# Patient Record
Sex: Female | Born: 1970 | Race: White | Hispanic: No | Marital: Married | Smoking: Former smoker
Health system: Southern US, Community
[De-identification: ages and names within clinical notes are randomized; demographics above are authoritative.]

## PROBLEM LIST (undated history)

## (undated) DIAGNOSIS — E669 Obesity, unspecified: Secondary | ICD-10-CM

## (undated) DIAGNOSIS — R413 Other amnesia: Secondary | ICD-10-CM

## (undated) DIAGNOSIS — N182 Chronic kidney disease, stage 2 (mild): Secondary | ICD-10-CM

## (undated) DIAGNOSIS — I10 Essential (primary) hypertension: Secondary | ICD-10-CM

## (undated) DIAGNOSIS — F319 Bipolar disorder, unspecified: Secondary | ICD-10-CM

## (undated) DIAGNOSIS — J189 Pneumonia, unspecified organism: Secondary | ICD-10-CM

## (undated) DIAGNOSIS — E785 Hyperlipidemia, unspecified: Secondary | ICD-10-CM

## (undated) DIAGNOSIS — I509 Heart failure, unspecified: Secondary | ICD-10-CM

## (undated) DIAGNOSIS — K589 Irritable bowel syndrome without diarrhea: Secondary | ICD-10-CM

## (undated) DIAGNOSIS — N186 End stage renal disease: Secondary | ICD-10-CM

## (undated) DIAGNOSIS — Z720 Tobacco use: Secondary | ICD-10-CM

## (undated) DIAGNOSIS — Z8659 Personal history of other mental and behavioral disorders: Secondary | ICD-10-CM

## (undated) DIAGNOSIS — R2689 Other abnormalities of gait and mobility: Secondary | ICD-10-CM

## (undated) DIAGNOSIS — J45909 Unspecified asthma, uncomplicated: Secondary | ICD-10-CM

## (undated) DIAGNOSIS — I2510 Atherosclerotic heart disease of native coronary artery without angina pectoris: Secondary | ICD-10-CM

## (undated) DIAGNOSIS — F419 Anxiety disorder, unspecified: Secondary | ICD-10-CM

## (undated) DIAGNOSIS — K219 Gastro-esophageal reflux disease without esophagitis: Secondary | ICD-10-CM

## (undated) DIAGNOSIS — E7800 Pure hypercholesterolemia, unspecified: Secondary | ICD-10-CM

## (undated) DIAGNOSIS — I499 Cardiac arrhythmia, unspecified: Secondary | ICD-10-CM

## (undated) DIAGNOSIS — M797 Fibromyalgia: Secondary | ICD-10-CM

## (undated) DIAGNOSIS — F32A Depression, unspecified: Secondary | ICD-10-CM

## (undated) DIAGNOSIS — D649 Anemia, unspecified: Secondary | ICD-10-CM

## (undated) DIAGNOSIS — R5382 Chronic fatigue, unspecified: Secondary | ICD-10-CM

## (undated) DIAGNOSIS — N189 Chronic kidney disease, unspecified: Secondary | ICD-10-CM

## (undated) DIAGNOSIS — E1010 Type 1 diabetes mellitus with ketoacidosis without coma: Secondary | ICD-10-CM

## (undated) DIAGNOSIS — N393 Stress incontinence (female) (male): Secondary | ICD-10-CM

## (undated) DIAGNOSIS — N390 Urinary tract infection, site not specified: Secondary | ICD-10-CM

## (undated) DIAGNOSIS — E039 Hypothyroidism, unspecified: Secondary | ICD-10-CM

## (undated) DIAGNOSIS — R51 Headache: Secondary | ICD-10-CM

## (undated) DIAGNOSIS — M1990 Unspecified osteoarthritis, unspecified site: Secondary | ICD-10-CM

## (undated) DIAGNOSIS — F329 Major depressive disorder, single episode, unspecified: Secondary | ICD-10-CM

## (undated) DIAGNOSIS — R251 Tremor, unspecified: Secondary | ICD-10-CM

## (undated) DIAGNOSIS — G4730 Sleep apnea, unspecified: Secondary | ICD-10-CM

## (undated) DIAGNOSIS — R519 Headache, unspecified: Secondary | ICD-10-CM

## (undated) DIAGNOSIS — R000 Tachycardia, unspecified: Secondary | ICD-10-CM

## (undated) DIAGNOSIS — M14679 Charcot's joint, unspecified ankle and foot: Secondary | ICD-10-CM

## (undated) HISTORY — PX: OVARY SURGERY: SHX727

## (undated) HISTORY — DX: Chronic fatigue, unspecified: R53.82

## (undated) HISTORY — DX: Hyperlipidemia, unspecified: E78.5

## (undated) HISTORY — DX: Fibromyalgia: M79.7

## (undated) HISTORY — DX: Headache: R51

## (undated) HISTORY — PX: INCONTINENCE SURGERY: SHX676

## (undated) HISTORY — PX: NASAL FRACTURE SURGERY: SHX718

## (undated) HISTORY — DX: Other amnesia: R41.3

## (undated) HISTORY — DX: Other abnormalities of gait and mobility: R26.89

## (undated) HISTORY — DX: Headache, unspecified: R51.9

## (undated) HISTORY — PX: OTHER SURGICAL HISTORY: SHX169

## (undated) HISTORY — DX: Tremor, unspecified: R25.1

## (undated) HISTORY — DX: Personal history of other mental and behavioral disorders: Z86.59

## (undated) HISTORY — DX: Tachycardia, unspecified: R00.0

---

## 1999-06-08 HISTORY — PX: UTERINE FIBROID SURGERY: SHX826

## 1999-07-29 ENCOUNTER — Encounter: Payer: Self-pay | Admitting: Emergency Medicine

## 1999-07-29 ENCOUNTER — Emergency Department (HOSPITAL_COMMUNITY): Admission: EM | Admit: 1999-07-29 | Discharge: 1999-07-29 | Payer: Self-pay | Admitting: Emergency Medicine

## 2000-10-06 ENCOUNTER — Other Ambulatory Visit: Admission: RE | Admit: 2000-10-06 | Discharge: 2000-10-06 | Payer: Self-pay

## 2000-12-28 ENCOUNTER — Emergency Department (HOSPITAL_COMMUNITY): Admission: EM | Admit: 2000-12-28 | Discharge: 2000-12-29 | Payer: Self-pay | Admitting: Emergency Medicine

## 2002-05-14 ENCOUNTER — Emergency Department (HOSPITAL_COMMUNITY): Admission: EM | Admit: 2002-05-14 | Discharge: 2002-05-14 | Payer: Self-pay | Admitting: Emergency Medicine

## 2008-10-16 ENCOUNTER — Emergency Department (HOSPITAL_COMMUNITY): Admission: EM | Admit: 2008-10-16 | Discharge: 2008-10-17 | Payer: Self-pay | Admitting: Emergency Medicine

## 2009-02-12 ENCOUNTER — Emergency Department (HOSPITAL_COMMUNITY): Admission: EM | Admit: 2009-02-12 | Discharge: 2009-02-12 | Payer: Self-pay | Admitting: Emergency Medicine

## 2009-02-13 ENCOUNTER — Ambulatory Visit: Payer: Self-pay | Admitting: *Deleted

## 2009-02-13 ENCOUNTER — Inpatient Hospital Stay (HOSPITAL_COMMUNITY): Admission: RE | Admit: 2009-02-13 | Discharge: 2009-02-15 | Payer: Self-pay | Admitting: *Deleted

## 2009-03-12 ENCOUNTER — Emergency Department (HOSPITAL_COMMUNITY): Admission: EM | Admit: 2009-03-12 | Discharge: 2009-03-13 | Payer: Self-pay | Admitting: Emergency Medicine

## 2009-03-13 ENCOUNTER — Inpatient Hospital Stay (HOSPITAL_COMMUNITY): Admission: AD | Admit: 2009-03-13 | Discharge: 2009-03-14 | Payer: Self-pay | Admitting: Psychiatry

## 2009-03-14 ENCOUNTER — Inpatient Hospital Stay (HOSPITAL_COMMUNITY): Admission: EM | Admit: 2009-03-14 | Discharge: 2009-03-18 | Payer: Self-pay | Admitting: Emergency Medicine

## 2009-03-14 ENCOUNTER — Ambulatory Visit: Payer: Self-pay | Admitting: Psychiatry

## 2010-01-08 ENCOUNTER — Emergency Department (HOSPITAL_COMMUNITY): Admission: EM | Admit: 2010-01-08 | Discharge: 2010-01-08 | Payer: Self-pay | Admitting: Emergency Medicine

## 2010-05-04 ENCOUNTER — Emergency Department (HOSPITAL_COMMUNITY): Admission: EM | Admit: 2010-05-04 | Discharge: 2010-05-05 | Payer: Self-pay | Admitting: Emergency Medicine

## 2010-06-30 ENCOUNTER — Emergency Department (HOSPITAL_COMMUNITY)
Admission: EM | Admit: 2010-06-30 | Discharge: 2010-06-30 | Payer: Self-pay | Source: Home / Self Care | Admitting: Emergency Medicine

## 2010-07-01 LAB — WET PREP, GENITAL
Trich, Wet Prep: NONE SEEN
Yeast Wet Prep HPF POC: NONE SEEN

## 2010-07-01 LAB — RPR: RPR Ser Ql: NONREACTIVE

## 2010-07-01 LAB — URINALYSIS, ROUTINE W REFLEX MICROSCOPIC
Bilirubin Urine: NEGATIVE
Ketones, ur: NEGATIVE mg/dL
Nitrite: NEGATIVE
Protein, ur: >300 mg/dL — AB
Specific Gravity, Urine: 1.018 (ref 1.005–1.030)
Urine Glucose, Fasting: 100 mg/dL — AB
Urobilinogen, UA: 0.2 mg/dL (ref 0.0–1.0)
pH: 6 (ref 5.0–8.0)

## 2010-07-01 LAB — URINE CULTURE: Colony Count: 8000

## 2010-07-01 LAB — URINE MICROSCOPIC-ADD ON

## 2010-07-01 LAB — POCT PREGNANCY, URINE: Preg Test, Ur: NEGATIVE

## 2010-07-02 ENCOUNTER — Emergency Department (HOSPITAL_COMMUNITY)
Admission: EM | Admit: 2010-07-02 | Discharge: 2010-07-02 | Payer: Self-pay | Source: Home / Self Care | Admitting: Emergency Medicine

## 2010-07-02 LAB — URINALYSIS, ROUTINE W REFLEX MICROSCOPIC
Bilirubin Urine: NEGATIVE
Nitrite: NEGATIVE
Protein, ur: >300 mg/dL — AB
Specific Gravity, Urine: 1.025 (ref 1.005–1.030)
Urobilinogen, UA: 0.2 mg/dL (ref 0.0–1.0)

## 2010-07-02 LAB — URINE MICROSCOPIC-ADD ON

## 2010-07-03 LAB — URINE CULTURE
Colony Count: NO GROWTH
Culture: NO GROWTH

## 2010-08-07 DIAGNOSIS — R072 Precordial pain: Secondary | ICD-10-CM

## 2010-08-18 LAB — URINALYSIS, ROUTINE W REFLEX MICROSCOPIC
Bilirubin Urine: NEGATIVE
Ketones, ur: NEGATIVE mg/dL
Leukocytes, UA: NEGATIVE
Nitrite: NEGATIVE
Specific Gravity, Urine: 1.01 (ref 1.005–1.030)
Urobilinogen, UA: 0.2 mg/dL (ref 0.0–1.0)

## 2010-08-18 LAB — BASIC METABOLIC PANEL
BUN: 15 mg/dL (ref 6–23)
CO2: 22 mEq/L (ref 19–32)
Calcium: 9.1 mg/dL (ref 8.4–10.5)
Chloride: 104 mEq/L (ref 96–112)
Creatinine, Ser: 1.81 mg/dL — ABNORMAL HIGH (ref 0.4–1.2)
GFR calc Af Amer: 38 mL/min — ABNORMAL LOW (ref >60)

## 2010-08-18 LAB — CBC
MCH: 30.1 pg (ref 26.0–34.0)
MCHC: 34.6 g/dL (ref 30.0–36.0)
MCV: 87 fL (ref 78.0–100.0)
Platelets: 309 10*3/uL (ref 150–400)
RBC: 4.68 MIL/uL (ref 3.87–5.11)
RDW: 13.1 % (ref 11.5–15.5)

## 2010-08-18 LAB — PREGNANCY, URINE: Preg Test, Ur: NEGATIVE

## 2010-08-18 LAB — DIFFERENTIAL
Basophils Absolute: 0.1 10*3/uL (ref 0.0–0.1)
Basophils Relative: 1 % (ref 0–1)
Eosinophils Absolute: 0.3 10*3/uL (ref 0.0–0.7)
Eosinophils Relative: 3 % (ref 0–5)
Lymphs Abs: 3.3 10*3/uL (ref 0.7–4.0)
Neutrophils Relative %: 57 % (ref 43–77)

## 2010-08-18 LAB — RAPID URINE DRUG SCREEN, HOSP PERFORMED
Barbiturates: NOT DETECTED
Opiates: NOT DETECTED
Tetrahydrocannabinol: NOT DETECTED

## 2010-08-18 LAB — SALICYLATE LEVEL: Salicylate Lvl: <4 mg/dL (ref 2.8–20.0)

## 2010-08-18 LAB — URINE MICROSCOPIC-ADD ON

## 2010-08-21 LAB — BASIC METABOLIC PANEL
Chloride: 101 mEq/L (ref 96–112)
Creatinine, Ser: 2.46 mg/dL — ABNORMAL HIGH (ref 0.4–1.2)
GFR calc Af Amer: 27 mL/min — ABNORMAL LOW (ref >60)

## 2010-08-21 LAB — GLUCOSE, CAPILLARY
Glucose-Capillary: 127 mg/dL — ABNORMAL HIGH (ref 70–99)
Glucose-Capillary: 343 mg/dL — ABNORMAL HIGH (ref 70–99)

## 2010-09-10 LAB — COMPREHENSIVE METABOLIC PANEL WITH GFR
ALT: 18 U/L (ref 0–35)
AST: 18 U/L (ref 0–37)
Albumin: 3 g/dL — ABNORMAL LOW (ref 3.5–5.2)
Alkaline Phosphatase: 115 U/L (ref 39–117)
Chloride: 100 meq/L (ref 96–112)
GFR calc Af Amer: 35 mL/min — ABNORMAL LOW (ref >60)
Potassium: 4.2 meq/L (ref 3.5–5.1)
Sodium: 127 meq/L — ABNORMAL LOW (ref 135–145)
Total Bilirubin: 0.9 mg/dL (ref 0.3–1.2)
Total Protein: 6.3 g/dL (ref 6.0–8.3)

## 2010-09-10 LAB — GLUCOSE, CAPILLARY
Glucose-Capillary: 115 mg/dL — ABNORMAL HIGH (ref 70–99)
Glucose-Capillary: 192 mg/dL — ABNORMAL HIGH (ref 70–99)
Glucose-Capillary: 227 mg/dL — ABNORMAL HIGH (ref 70–99)
Glucose-Capillary: 37 mg/dL — CL (ref 70–99)
Glucose-Capillary: 53 mg/dL — ABNORMAL LOW (ref 70–99)
Glucose-Capillary: 83 mg/dL (ref 70–99)
Glucose-Capillary: 103 mg/dL — ABNORMAL HIGH (ref 70–99)
Glucose-Capillary: 105 mg/dL — ABNORMAL HIGH (ref 70–99)
Glucose-Capillary: 117 mg/dL — ABNORMAL HIGH (ref 70–99)
Glucose-Capillary: 133 mg/dL — ABNORMAL HIGH (ref 70–99)
Glucose-Capillary: 137 mg/dL — ABNORMAL HIGH (ref 70–99)
Glucose-Capillary: 147 mg/dL — ABNORMAL HIGH (ref 70–99)
Glucose-Capillary: 152 mg/dL — ABNORMAL HIGH (ref 70–99)
Glucose-Capillary: 166 mg/dL — ABNORMAL HIGH (ref 70–99)
Glucose-Capillary: 191 mg/dL — ABNORMAL HIGH (ref 70–99)
Glucose-Capillary: 191 mg/dL — ABNORMAL HIGH (ref 70–99)
Glucose-Capillary: 194 mg/dL — ABNORMAL HIGH (ref 70–99)
Glucose-Capillary: 200 mg/dL — ABNORMAL HIGH (ref 70–99)
Glucose-Capillary: 203 mg/dL — ABNORMAL HIGH (ref 70–99)
Glucose-Capillary: 215 mg/dL — ABNORMAL HIGH (ref 70–99)
Glucose-Capillary: 220 mg/dL — ABNORMAL HIGH (ref 70–99)
Glucose-Capillary: 221 mg/dL — ABNORMAL HIGH (ref 70–99)
Glucose-Capillary: 222 mg/dL — ABNORMAL HIGH (ref 70–99)
Glucose-Capillary: 222 mg/dL — ABNORMAL HIGH (ref 70–99)
Glucose-Capillary: 227 mg/dL — ABNORMAL HIGH (ref 70–99)
Glucose-Capillary: 236 mg/dL — ABNORMAL HIGH (ref 70–99)
Glucose-Capillary: 253 mg/dL — ABNORMAL HIGH (ref 70–99)
Glucose-Capillary: 257 mg/dL — ABNORMAL HIGH (ref 70–99)
Glucose-Capillary: 260 mg/dL — ABNORMAL HIGH (ref 70–99)
Glucose-Capillary: 278 mg/dL — ABNORMAL HIGH (ref 70–99)
Glucose-Capillary: 300 mg/dL — ABNORMAL HIGH (ref 70–99)
Glucose-Capillary: 328 mg/dL — ABNORMAL HIGH (ref 70–99)
Glucose-Capillary: 41 mg/dL — ABNORMAL LOW (ref 70–99)
Glucose-Capillary: 42 mg/dL — ABNORMAL LOW (ref 70–99)
Glucose-Capillary: 78 mg/dL (ref 70–99)
Glucose-Capillary: 81 mg/dL (ref 70–99)
Glucose-Capillary: 93 mg/dL (ref 70–99)
Glucose-Capillary: 93 mg/dL (ref 70–99)
Glucose-Capillary: >600 mg/dL (ref 70–99)
Glucose-Capillary: >600 mg/dL (ref 70–99)
Glucose-Capillary: >600 mg/dL (ref 70–99)
Glucose-Capillary: >600 mg/dL (ref 70–99)

## 2010-09-10 LAB — BLOOD GAS, VENOUS
Acid-base deficit: 4.1 mmol/L — ABNORMAL HIGH (ref 0.0–2.0)
Bicarbonate: 18.8 mEq/L — ABNORMAL LOW (ref 20.0–24.0)
Drawn by: 129801
FIO2: 0.21 %
O2 Saturation: 91.9 %
Patient temperature: 98.6
TCO2: 16.7 mmol/L (ref 0–100)
pCO2, Ven: 29.3 mmHg — ABNORMAL LOW (ref 45.0–50.0)
pH, Ven: 7.422 — ABNORMAL HIGH (ref 7.250–7.300)
pO2, Ven: 61.3 mmHg — ABNORMAL HIGH (ref 30.0–45.0)

## 2010-09-10 LAB — BASIC METABOLIC PANEL
BUN: 17 mg/dL (ref 6–23)
BUN: 22 mg/dL (ref 6–23)
BUN: 23 mg/dL (ref 6–23)
BUN: 23 mg/dL (ref 6–23)
BUN: 25 mg/dL — ABNORMAL HIGH (ref 6–23)
BUN: 25 mg/dL — ABNORMAL HIGH (ref 6–23)
BUN: 26 mg/dL — ABNORMAL HIGH (ref 6–23)
BUN: 35 mg/dL — ABNORMAL HIGH (ref 6–23)
Calcium: 7.9 mg/dL — ABNORMAL LOW (ref 8.4–10.5)
Calcium: 8.2 mg/dL — ABNORMAL LOW (ref 8.4–10.5)
Calcium: 8.2 mg/dL — ABNORMAL LOW (ref 8.4–10.5)
Calcium: 8.5 mg/dL (ref 8.4–10.5)
Calcium: 9.1 mg/dL (ref 8.4–10.5)
Chloride: 101 mEq/L (ref 96–112)
Chloride: 106 mEq/L (ref 96–112)
Chloride: 107 mEq/L (ref 96–112)
Creatinine, Ser: 1.46 mg/dL — ABNORMAL HIGH (ref 0.4–1.2)
Creatinine, Ser: 1.49 mg/dL — ABNORMAL HIGH (ref 0.4–1.2)
Creatinine, Ser: 1.53 mg/dL — ABNORMAL HIGH (ref 0.4–1.2)
Creatinine, Ser: 1.53 mg/dL — ABNORMAL HIGH (ref 0.4–1.2)
Creatinine, Ser: 1.75 mg/dL — ABNORMAL HIGH (ref 0.4–1.2)
Creatinine, Ser: 1.78 mg/dL — ABNORMAL HIGH (ref 0.4–1.2)
Creatinine, Ser: 2.04 mg/dL — ABNORMAL HIGH (ref 0.4–1.2)
GFR calc Af Amer: 33 mL/min — ABNORMAL LOW (ref >60)
GFR calc Af Amer: 37 mL/min — ABNORMAL LOW (ref >60)
GFR calc Af Amer: 46 mL/min — ABNORMAL LOW (ref >60)
GFR calc Af Amer: 46 mL/min — ABNORMAL LOW (ref >60)
GFR calc non Af Amer: 27 mL/min — ABNORMAL LOW (ref >60)
GFR calc non Af Amer: 30 mL/min — ABNORMAL LOW (ref >60)
GFR calc non Af Amer: 32 mL/min — ABNORMAL LOW (ref >60)
GFR calc non Af Amer: 33 mL/min — ABNORMAL LOW (ref >60)
GFR calc non Af Amer: 35 mL/min — ABNORMAL LOW (ref >60)
GFR calc non Af Amer: 38 mL/min — ABNORMAL LOW (ref >60)
GFR calc non Af Amer: 40 mL/min — ABNORMAL LOW (ref >60)
Glucose, Bld: 158 mg/dL — ABNORMAL HIGH (ref 70–99)
Glucose, Bld: 164 mg/dL — ABNORMAL HIGH (ref 70–99)
Glucose, Bld: 222 mg/dL — ABNORMAL HIGH (ref 70–99)
Glucose, Bld: 249 mg/dL — ABNORMAL HIGH (ref 70–99)
Glucose, Bld: 291 mg/dL — ABNORMAL HIGH (ref 70–99)
Potassium: 3.6 mEq/L (ref 3.5–5.1)
Potassium: 3.7 mEq/L (ref 3.5–5.1)
Potassium: 3.7 mEq/L (ref 3.5–5.1)
Potassium: 4.2 mEq/L (ref 3.5–5.1)
Sodium: 133 mEq/L — ABNORMAL LOW (ref 135–145)

## 2010-09-10 LAB — BASIC METABOLIC PANEL WITH GFR
CO2: 18 meq/L — ABNORMAL LOW (ref 19–32)
Calcium: 8.4 mg/dL (ref 8.4–10.5)
Chloride: 96 meq/L (ref 96–112)
Glucose, Bld: 919 mg/dL (ref 70–99)
Potassium: 7.7 meq/L (ref 3.5–5.1)
Sodium: 125 meq/L — ABNORMAL LOW (ref 135–145)

## 2010-09-10 LAB — URINE CULTURE
Colony Count: NO GROWTH
Culture: NO GROWTH

## 2010-09-10 LAB — CARDIAC PANEL(CRET KIN+CKTOT+MB+TROPI)
CK, MB: 1.7 ng/mL (ref 0.3–4.0)
Relative Index: INVALID (ref 0.0–2.5)
Troponin I: 0.01 ng/mL (ref 0.00–0.06)
Troponin I: 0.01 ng/mL (ref 0.00–0.06)

## 2010-09-10 LAB — CBC
HCT: 38.8 % (ref 36.0–46.0)
Hemoglobin: 13.4 g/dL (ref 12.0–15.0)
MCHC: 34.6 g/dL (ref 30.0–36.0)
MCV: 91.9 fL (ref 78.0–100.0)
Platelets: 258 10*3/uL (ref 150–400)
Platelets: 307 K/uL (ref 150–400)
RBC: 4.22 MIL/uL (ref 3.87–5.11)
RDW: 12 % (ref 11.5–15.5)
RDW: 12.4 % (ref 11.5–15.5)
WBC: 13.4 10*3/uL — ABNORMAL HIGH (ref 4.0–10.5)
WBC: 9.9 10*3/uL (ref 4.0–10.5)

## 2010-09-10 LAB — MAGNESIUM
Magnesium: 1.6 mg/dL (ref 1.5–2.5)
Magnesium: 1.6 mg/dL (ref 1.5–2.5)
Magnesium: 1.6 mg/dL (ref 1.5–2.5)
Magnesium: 1.7 mg/dL (ref 1.5–2.5)
Magnesium: 1.7 mg/dL (ref 1.5–2.5)
Magnesium: 1.8 mg/dL (ref 1.5–2.5)
Magnesium: 2 mg/dL (ref 1.5–2.5)

## 2010-09-10 LAB — COMPREHENSIVE METABOLIC PANEL
BUN: 34 mg/dL — ABNORMAL HIGH (ref 6–23)
CO2: 19 mEq/L (ref 19–32)
Calcium: 9.5 mg/dL (ref 8.4–10.5)
Creatinine, Ser: 1.95 mg/dL — ABNORMAL HIGH (ref 0.4–1.2)
GFR calc non Af Amer: 29 mL/min — ABNORMAL LOW (ref >60)
Glucose, Bld: 519 mg/dL (ref 70–99)

## 2010-09-10 LAB — URINALYSIS, ROUTINE W REFLEX MICROSCOPIC
Bilirubin Urine: NEGATIVE
Glucose, UA: >1000 mg/dL — AB
Ketones, ur: 15 mg/dL — AB
Leukocytes, UA: NEGATIVE
Nitrite: NEGATIVE
Protein, ur: 100 mg/dL — AB
Specific Gravity, Urine: 1.017 (ref 1.005–1.030)
Urobilinogen, UA: 0.2 mg/dL (ref 0.0–1.0)
pH: 7 (ref 5.0–8.0)

## 2010-09-10 LAB — TROPONIN I: Troponin I: 0.01 ng/mL (ref 0.00–0.06)

## 2010-09-10 LAB — DIFFERENTIAL
Basophils Absolute: 0.4 K/uL — ABNORMAL HIGH (ref 0.0–0.1)
Basophils Relative: 3 % — ABNORMAL HIGH (ref 0–1)
Eosinophils Absolute: 0.2 K/uL (ref 0.0–0.7)
Eosinophils Relative: 2 % (ref 0–5)
Lymphocytes Relative: 10 % — ABNORMAL LOW (ref 12–46)
Lymphs Abs: 1.4 10*3/uL (ref 0.7–4.0)
Monocytes Absolute: 0.8 10*3/uL (ref 0.1–1.0)
Monocytes Relative: 6 % (ref 3–12)
Neutro Abs: 10.6 10*3/uL — ABNORMAL HIGH (ref 1.7–7.7)
Neutrophils Relative %: 79 % — ABNORMAL HIGH (ref 43–77)

## 2010-09-10 LAB — URINE MICROSCOPIC-ADD ON

## 2010-09-10 LAB — KETONES, QUALITATIVE

## 2010-09-10 LAB — HEMOGLOBIN A1C
Hgb A1c MFr Bld: 9.5 % — ABNORMAL HIGH (ref 4.6–6.1)
Mean Plasma Glucose: 226 mg/dL

## 2010-09-10 LAB — CK: Total CK: 46 U/L (ref 7–177)

## 2010-09-10 LAB — PHOSPHORUS: Phosphorus: 1.1 mg/dL — ABNORMAL LOW (ref 2.3–4.6)

## 2010-09-11 LAB — URINALYSIS, ROUTINE W REFLEX MICROSCOPIC
Ketones, ur: NEGATIVE mg/dL
Leukocytes, UA: NEGATIVE
Nitrite: NEGATIVE
Specific Gravity, Urine: 1.011 (ref 1.005–1.030)
Urobilinogen, UA: 0.2 mg/dL (ref 0.0–1.0)
pH: 6.5 (ref 5.0–8.0)

## 2010-09-11 LAB — GLUCOSE, CAPILLARY
Glucose-Capillary: 146 mg/dL — ABNORMAL HIGH (ref 70–99)
Glucose-Capillary: 199 mg/dL — ABNORMAL HIGH (ref 70–99)
Glucose-Capillary: 251 mg/dL — ABNORMAL HIGH (ref 70–99)
Glucose-Capillary: 284 mg/dL — ABNORMAL HIGH (ref 70–99)
Glucose-Capillary: 57 mg/dL — ABNORMAL LOW (ref 70–99)

## 2010-09-11 LAB — CBC
HCT: 37.5 % (ref 36.0–46.0)
Hemoglobin: 12.8 g/dL (ref 12.0–15.0)
MCHC: 34.1 g/dL (ref 30.0–36.0)
MCV: 91.5 fL (ref 78.0–100.0)
Platelets: 291 10*3/uL (ref 150–400)
Platelets: 342 10*3/uL (ref 150–400)
RDW: 12.5 % (ref 11.5–15.5)
RDW: 12.7 % (ref 11.5–15.5)

## 2010-09-11 LAB — DIFFERENTIAL
Basophils Absolute: 0 10*3/uL (ref 0.0–0.1)
Basophils Absolute: 0.1 10*3/uL (ref 0.0–0.1)
Basophils Relative: 0 % (ref 0–1)
Basophils Relative: 1 % (ref 0–1)
Eosinophils Relative: 6 % — ABNORMAL HIGH (ref 0–5)
Lymphocytes Relative: 22 % (ref 12–46)
Lymphocytes Relative: 25 % (ref 12–46)
Monocytes Absolute: 0.8 10*3/uL (ref 0.1–1.0)
Monocytes Relative: 6 % (ref 3–12)
Neutro Abs: 6.9 10*3/uL (ref 1.7–7.7)
Neutrophils Relative %: 59 % (ref 43–77)

## 2010-09-11 LAB — RAPID URINE DRUG SCREEN, HOSP PERFORMED
Amphetamines: NOT DETECTED
Barbiturates: NOT DETECTED
Barbiturates: NOT DETECTED
Benzodiazepines: POSITIVE — AB
Cocaine: NOT DETECTED
Cocaine: NOT DETECTED
Opiates: NOT DETECTED
Opiates: NOT DETECTED
Tetrahydrocannabinol: NOT DETECTED
Tetrahydrocannabinol: NOT DETECTED

## 2010-09-11 LAB — BASIC METABOLIC PANEL
BUN: 21 mg/dL (ref 6–23)
BUN: 22 mg/dL (ref 6–23)
CO2: 25 mEq/L (ref 19–32)
Calcium: 9.3 mg/dL (ref 8.4–10.5)
Chloride: 100 mEq/L (ref 96–112)
Creatinine, Ser: 1.93 mg/dL — ABNORMAL HIGH (ref 0.4–1.2)
GFR calc non Af Amer: 29 mL/min — ABNORMAL LOW (ref >60)
Glucose, Bld: 214 mg/dL — ABNORMAL HIGH (ref 70–99)
Glucose, Bld: 263 mg/dL — ABNORMAL HIGH (ref 70–99)
Potassium: 4.3 mEq/L (ref 3.5–5.1)
Sodium: 133 mEq/L — ABNORMAL LOW (ref 135–145)
Sodium: 134 mEq/L — ABNORMAL LOW (ref 135–145)

## 2010-09-11 LAB — POCT PREGNANCY, URINE: Preg Test, Ur: NEGATIVE

## 2010-09-11 LAB — URINE MICROSCOPIC-ADD ON

## 2010-09-11 LAB — TRICYCLICS SCREEN, URINE: TCA Scrn: POSITIVE — AB

## 2010-09-15 LAB — CBC
HCT: 36.2 % (ref 36.0–46.0)
Hemoglobin: 12.6 g/dL (ref 12.0–15.0)
MCHC: 34.7 g/dL (ref 30.0–36.0)
MCV: 90.4 fL (ref 78.0–100.0)
RBC: 4 MIL/uL (ref 3.87–5.11)
RDW: 12.9 % (ref 11.5–15.5)

## 2010-09-15 LAB — DIFFERENTIAL
Basophils Absolute: 0.1 10*3/uL (ref 0.0–0.1)
Basophils Relative: 1 % (ref 0–1)
Eosinophils Absolute: 0.7 10*3/uL (ref 0.0–0.7)
Eosinophils Relative: 7 % — ABNORMAL HIGH (ref 0–5)
Monocytes Absolute: 0.8 10*3/uL (ref 0.1–1.0)
Monocytes Relative: 7 % (ref 3–12)
Neutro Abs: 6.5 10*3/uL (ref 1.7–7.7)

## 2010-09-15 LAB — GC/CHLAMYDIA PROBE AMP, GENITAL: Chlamydia, DNA Probe: NEGATIVE

## 2010-09-15 LAB — URINALYSIS, ROUTINE W REFLEX MICROSCOPIC
Bilirubin Urine: NEGATIVE
Glucose, UA: NEGATIVE mg/dL
Hgb urine dipstick: NEGATIVE
Specific Gravity, Urine: 1.015 (ref 1.005–1.030)
Urobilinogen, UA: 0.2 mg/dL (ref 0.0–1.0)

## 2010-09-15 LAB — BASIC METABOLIC PANEL
CO2: 28 mEq/L (ref 19–32)
Calcium: 9.4 mg/dL (ref 8.4–10.5)
Chloride: 94 mEq/L — ABNORMAL LOW (ref 96–112)
GFR calc Af Amer: 32 mL/min — ABNORMAL LOW (ref >60)
Glucose, Bld: 169 mg/dL — ABNORMAL HIGH (ref 70–99)
Sodium: 131 mEq/L — ABNORMAL LOW (ref 135–145)

## 2010-09-15 LAB — WET PREP, GENITAL

## 2010-12-26 ENCOUNTER — Emergency Department (HOSPITAL_COMMUNITY)
Admission: EM | Admit: 2010-12-26 | Discharge: 2010-12-26 | Disposition: A | Discharge status: Home Health | Payer: Medicaid Other | Attending: Emergency Medicine | Admitting: Emergency Medicine

## 2010-12-26 ENCOUNTER — Encounter: Payer: Self-pay | Admitting: *Deleted

## 2010-12-26 DIAGNOSIS — R112 Nausea with vomiting, unspecified: Secondary | ICD-10-CM | POA: Insufficient documentation

## 2010-12-26 DIAGNOSIS — I10 Essential (primary) hypertension: Secondary | ICD-10-CM | POA: Insufficient documentation

## 2010-12-26 DIAGNOSIS — E0789 Other specified disorders of thyroid: Secondary | ICD-10-CM | POA: Insufficient documentation

## 2010-12-26 DIAGNOSIS — R197 Diarrhea, unspecified: Secondary | ICD-10-CM | POA: Insufficient documentation

## 2010-12-26 DIAGNOSIS — R109 Unspecified abdominal pain: Secondary | ICD-10-CM | POA: Insufficient documentation

## 2010-12-26 DIAGNOSIS — K5289 Other specified noninfective gastroenteritis and colitis: Secondary | ICD-10-CM | POA: Insufficient documentation

## 2010-12-26 DIAGNOSIS — Z79899 Other long term (current) drug therapy: Secondary | ICD-10-CM | POA: Insufficient documentation

## 2010-12-26 DIAGNOSIS — E119 Type 2 diabetes mellitus without complications: Secondary | ICD-10-CM | POA: Insufficient documentation

## 2010-12-26 DIAGNOSIS — F17200 Nicotine dependence, unspecified, uncomplicated: Secondary | ICD-10-CM | POA: Insufficient documentation

## 2010-12-26 HISTORY — DX: Essential (primary) hypertension: I10

## 2010-12-26 HISTORY — DX: Bipolar disorder, unspecified: F31.9

## 2010-12-26 LAB — URINE MICROSCOPIC-ADD ON

## 2010-12-26 LAB — URINALYSIS, ROUTINE W REFLEX MICROSCOPIC
Glucose, UA: NEGATIVE mg/dL
Ketones, ur: 15 mg/dL — AB
Nitrite: NEGATIVE
Protein, ur: 30 mg/dL — AB
pH: 5.5 (ref 5.0–8.0)

## 2010-12-26 LAB — DIFFERENTIAL
Eosinophils Relative: 1 % (ref 0–5)
Lymphocytes Relative: 23 % (ref 12–46)
Monocytes Relative: 7 % (ref 3–12)

## 2010-12-26 LAB — CBC
HCT: 42.2 % (ref 36.0–46.0)
MCV: 87.7 fL (ref 78.0–100.0)
RBC: 4.81 MIL/uL (ref 3.87–5.11)
RDW: 13 % (ref 11.5–15.5)
WBC: 13.3 10*3/uL — ABNORMAL HIGH (ref 4.0–10.5)

## 2010-12-26 MED ORDER — DIPHENOXYLATE-ATROPINE 2.5-0.025 MG PO TABS
2.0000 | ORAL_TABLET | Freq: Once | ORAL | Status: AC
  Administered 2010-12-26: 2 via ORAL
  Filled 2010-12-26: qty 2

## 2010-12-26 MED ORDER — DIPHENOXYLATE-ATROPINE 2.5-0.025 MG PO TABS: 1.0000 | ORAL_TABLET | Freq: Four times a day (QID) | ORAL | Status: AC | PRN

## 2010-12-26 MED ORDER — ONDANSETRON 8 MG PO TBDP
8.0000 mg | ORAL_TABLET | Freq: Once | ORAL | Status: AC
  Administered 2010-12-26: 8 mg via ORAL
  Filled 2010-12-26: qty 1

## 2010-12-26 MED ORDER — ONDANSETRON HCL 4 MG PO TABS: 4.0000 mg | ORAL_TABLET | Freq: Four times a day (QID) | ORAL | Status: AC

## 2010-12-26 NOTE — ED Notes (Signed)
Generalized abdominal pain, N/V/D began this morning. Vomited x 1 today. Pt states diarrhea all day. NAD at this time.

## 2010-12-26 NOTE — ED Provider Notes (Addendum)
History     Chief Complaint  Patient presents with  . Abdominal Pain   Patient is a 40 y.o. female presenting with abdominal pain. The history is provided by the patient.  Abdominal Pain The primary symptoms of the illness include abdominal pain, nausea, vomiting and diarrhea. Primary symptoms comment: friend ill c same sxs  Associated with: nothing. The patient states that she believes she is currently not pregnant. Symptoms associated with the illness do not include chills, diaphoresis or heartburn. Significant associated medical issues include diabetes. Associated medical issues comments: renal insuf.    Past Medical History  Diagnosis Date  . Hypertension   . Diabetes mellitus   . Thyroid disease   . Bipolar disorder   . Renal disorder     Past Surgical History  Procedure Date  . Ovary removed   . Nasal fracture surgery     No family history on file.  History  Substance Use Topics  . Smoking status: Current Everyday Smoker  . Smokeless tobacco: Not on file  . Alcohol Use: No    OB History    Grav Para Term Preterm Abortions TAB SAB Ect Mult Living                  Review of Systems  Constitutional: Negative for chills and diaphoresis.  Gastrointestinal: Positive for nausea, vomiting, abdominal pain and diarrhea. Negative for heartburn.  All other systems reviewed and are negative.    Physical Exam  BP 110/71  Pulse 82  Temp(Src) 98.4 F (36.9 C) (Oral)  Resp 20  Ht 5\' 9"  (1.753 m)  Wt 270 lb (122.471 kg)  BMI 39.87 kg/m2  SpO2 99%  Physical Exam  Nursing note and vitals reviewed. Constitutional: She is oriented to person, place, and time. She appears well-developed and well-nourished.  HENT:  Head: Normocephalic and atraumatic.  Eyes: Conjunctivae and EOM are normal. Pupils are equal, round, and reactive to light.  Neck: Normal range of motion. Neck supple.  Cardiovascular: Normal rate and regular rhythm.   Pulmonary/Chest: Effort normal and  breath sounds normal.  Abdominal: Soft. Bowel sounds are normal.  Musculoskeletal: Normal range of motion.  Neurological: She is alert and oriented to person, place, and time.  Skin: Skin is warm and dry.  Psychiatric: She has a normal mood and affect.    ED Course  Procedures  MDM Pt feels much better;  No acute abd;  Will take prn rx's for diarrhea and nausea, clear liquids;  Return if worse.     [name], MD 12/26/10 [id number]  [name], MD 01/15/11 [phone]

## 2011-01-22 ENCOUNTER — Emergency Department (HOSPITAL_COMMUNITY)
Admission: EM | Admit: 2011-01-22 | Discharge: 2011-01-22 | Disposition: A | Discharge status: Home / Self Care | Payer: Medicare Other | Attending: Emergency Medicine | Admitting: Emergency Medicine

## 2011-01-22 ENCOUNTER — Encounter (HOSPITAL_COMMUNITY): Payer: Self-pay | Admitting: Emergency Medicine

## 2011-01-22 DIAGNOSIS — I10 Essential (primary) hypertension: Secondary | ICD-10-CM | POA: Insufficient documentation

## 2011-01-22 DIAGNOSIS — F319 Bipolar disorder, unspecified: Secondary | ICD-10-CM | POA: Insufficient documentation

## 2011-01-22 DIAGNOSIS — E119 Type 2 diabetes mellitus without complications: Secondary | ICD-10-CM | POA: Insufficient documentation

## 2011-01-22 DIAGNOSIS — Z794 Long term (current) use of insulin: Secondary | ICD-10-CM | POA: Insufficient documentation

## 2011-01-22 DIAGNOSIS — K5289 Other specified noninfective gastroenteritis and colitis: Secondary | ICD-10-CM | POA: Insufficient documentation

## 2011-01-22 DIAGNOSIS — E079 Disorder of thyroid, unspecified: Secondary | ICD-10-CM | POA: Insufficient documentation

## 2011-01-22 DIAGNOSIS — Z79899 Other long term (current) drug therapy: Secondary | ICD-10-CM | POA: Insufficient documentation

## 2011-01-22 DIAGNOSIS — N289 Disorder of kidney and ureter, unspecified: Secondary | ICD-10-CM | POA: Insufficient documentation

## 2011-01-22 DIAGNOSIS — F17200 Nicotine dependence, unspecified, uncomplicated: Secondary | ICD-10-CM | POA: Insufficient documentation

## 2011-01-22 DIAGNOSIS — Z7982 Long term (current) use of aspirin: Secondary | ICD-10-CM | POA: Insufficient documentation

## 2011-01-22 LAB — BASIC METABOLIC PANEL
BUN: 15 mg/dL (ref 6–23)
Calcium: 10.1 mg/dL (ref 8.4–10.5)
GFR calc non Af Amer: 39 mL/min — ABNORMAL LOW (ref >60)
Glucose, Bld: 310 mg/dL — ABNORMAL HIGH (ref 70–99)
Sodium: 135 mEq/L (ref 135–145)

## 2011-01-22 LAB — CBC
Platelets: 360 10*3/uL (ref 150–400)
RBC: 4.55 MIL/uL (ref 3.87–5.11)
RDW: 13.7 % (ref 11.5–15.5)
WBC: 12.3 10*3/uL — ABNORMAL HIGH (ref 4.0–10.5)

## 2011-01-22 LAB — DIFFERENTIAL
Basophils Absolute: 0.1 10*3/uL (ref 0.0–0.1)
Lymphocytes Relative: 11 % — ABNORMAL LOW (ref 12–46)
Lymphs Abs: 1.4 10*3/uL (ref 0.7–4.0)
Neutro Abs: 10.2 10*3/uL — ABNORMAL HIGH (ref 1.7–7.7)

## 2011-01-22 MED ORDER — ONDANSETRON HCL 4 MG PO TABS: 4.0000 mg | ORAL_TABLET | Freq: Four times a day (QID) | ORAL | Status: AC

## 2011-01-22 MED ORDER — ONDANSETRON HCL 4 MG/2ML IJ SOLN
4.0000 mg | Freq: Once | INTRAMUSCULAR | Status: AC
  Administered 2011-01-22: 4 mg via INTRAVENOUS
  Filled 2011-01-22: qty 2

## 2011-01-22 MED ORDER — SODIUM CHLORIDE 0.9 % IV BOLUS (SEPSIS)
1000.0000 mL | Freq: Once | INTRAVENOUS | Status: AC
  Administered 2011-01-22: 1000 mL via INTRAVENOUS

## 2011-01-22 MED ORDER — ONDANSETRON 8 MG PO TBDP
8.0000 mg | ORAL_TABLET | Freq: Once | ORAL | Status: AC
  Administered 2011-01-22: 8 mg via ORAL
  Filled 2011-01-22: qty 1

## 2011-01-22 MED ORDER — PANTOPRAZOLE SODIUM 40 MG IV SOLR
40.0000 mg | Freq: Once | INTRAVENOUS | Status: AC
  Administered 2011-01-22: 40 mg via INTRAVENOUS
  Filled 2011-01-22: qty 40

## 2011-01-22 MED ORDER — RANITIDINE HCL 150 MG PO TABS: 150.0000 mg | ORAL_TABLET | Freq: Two times a day (BID) | ORAL | Status: DC

## 2011-01-22 NOTE — ED Notes (Signed)
Patient is resting comfortably. Family at bedside.  

## 2011-01-22 NOTE — ED Notes (Addendum)
No tenderness upon palpation of the abdomen.  Patient has some bruising in her LLQ, pt states that it is from her insulin shots. Last meal was last night, meal was not heavy or greasy. Patient states that her BM last night was her last one and was normal. Patient states that she has not vomitted anything before like this and has not since her one episode earlier today. Patient vomited dark bloody sputum earlier today. States that her throat doesn't hurt and her pain is 0/10.

## 2011-01-22 NOTE — ED Notes (Signed)
edp in with pt to update 

## 2011-01-22 NOTE — ED Provider Notes (Addendum)
Scribed for [name], MD, the patient was seen in room 18 This chart was scribed by [facility]. This patient's care was started at 15:13.   CSN: [id number] Arrival date & time: 01/22/2011  1:59 PM  Chief Complaint  Patient presents with  . Nausea/Vomiting     HPI Jasmine [name] is a 40 y.o. female with a history of DM, hypertension, thyroid disease, renal disorder, and bipolar disorder, was brought in by ambulance who presents to the Emergency Department complaining of nausea and vomiting. Patient had one episode of "brown-colored" emesis today with possible small steaks of blood. She reports multiple episodes of "dry-heaves" and mild abdominal discomfort. There are no other associated symptoms and no other alleviating or aggravating factors.    HPI ELEMENTS:  Location: GI, nausea/vomiting  Onset: this morning Timing: constant   Context: as above  Associated symptoms: abdominal discomfort due to dry-heaves.   Past Medical History  Diagnosis Date  . Hypertension   . Diabetes mellitus   . Thyroid disease   . Bipolar disorder   . Renal disorder     Past Surgical History  Procedure Date  . Ovary removed   . Nasal fracture surgery    MEDICATIONS:  Previous Medications   ALPRAZOLAM (XANAX) 1 MG TABLET    Take 1 mg by mouth. Prn for anxiety    AMLODIPINE (NORVASC) 5 MG TABLET    Take 5 mg by mouth 2 (two) times daily.     ASPIRIN 81 MG TABLET    Take 81 mg by mouth daily.     BUPROPION (WELLBUTRIN) 75 MG TABLET    Take 75 mg by mouth. q hs    CARVEDILOL (COREG) 25 MG TABLET    Take 25 mg by mouth 2 (two) times daily with a meal.     ESCITALOPRAM (LEXAPRO) 10 MG TABLET    Take 10 mg by mouth daily.     FUROSEMIDE (LASIX) 20 MG TABLET    Take 20 mg by mouth 2 (two) times daily. Prn edema    HYDROCHLOROTHIAZIDE 25 MG TABLET    Take 25 mg by mouth daily.     INSULIN GLARGINE (LANTUS) 100 UNIT/ML INJECTION    Inject 60 Units into the skin at bedtime.    INSULIN REGULAR (HUMULIN  R,NOVOLIN R) 100 UNITS/ML INJECTION    Inject 20 Units into the skin 3 (three) times daily before meals. Sliding scale does not always use 20 nits 3 times a day   LAMOTRIGINE (LAMICTAL) 200 MG TABLET    Take 200 mg by mouth at bedtime.    LEVOTHYROXINE (SYNTHROID, LEVOTHROID) 88 MCG TABLET    Take 88 mcg by mouth daily.     LISINOPRIL (PRINIVIL,ZESTRIL) 20 MG TABLET    Take 20 mg by mouth 2 (two) times daily.     MIRTAZAPINE (REMERON) 30 MG TABLET    Take 30 mg by mouth at bedtime.     MULTIPLE VITAMIN (MULTIVITAMIN) TABLET    Take 1 tablet by mouth daily.     PRAVASTATIN (PRAVACHOL) 10 MG TABLET    Take 10 mg by mouth at bedtime. q hs   ZIPRASIDONE (GEODON) 60 MG CAPSULE    Take 60 mg by mouth at bedtime.     ALLERGIES:  Allergies as of 01/22/2011 - Review Complete 01/22/2011  Allergen Reaction Noted  . Levaquin  12/26/2010    FAMILY HISTORY:  No Pertinent Family History  SOCIAL HISTORY: Accompanied to the ED by spouse History  Substance Use Topics  . Smoking status: Current Everyday Smoker  . Smokeless tobacco: Not on file  . Alcohol Use: No    Review of Systems  HENT:       Dry heave  Gastrointestinal: Positive for nausea and vomiting. Negative for abdominal pain and blood in stool.       Denies abdominal soreness Denies Melena   Genitourinary: Negative for dysuria.  Musculoskeletal: Positive for back pain.  All other systems reviewed and are negative.    Physical Exam  BP 157/83  Pulse 74  Temp(Src) 97.9 F (36.6 C) (Oral)  Ht 5\' 9"  (1.753 m)  Wt 260 lb (117.935 kg)  BMI 38.40 kg/m2  SpO2 95%  Physical Exam  Nursing note and vitals reviewed. Constitutional: She is oriented to person, place, and time. She appears well-developed and well-nourished.  HENT:  Head: Normocephalic and atraumatic.  Eyes: Conjunctivae and EOM are normal. Pupils are equal, round, and reactive to light.  Neck: Normal range of motion. Neck supple.  Cardiovascular: Normal rate and  regular rhythm.   Pulmonary/Chest: Effort normal and breath sounds normal.  Abdominal: Soft. Bowel sounds are normal. There is no tenderness.  Musculoskeletal: Normal range of motion.       Minimal lower back pain   Neurological: She is alert and oriented to person, place, and time. No cranial nerve deficit.  Skin: Skin is warm and dry.  Psychiatric: She has a normal mood and affect. Her behavior is normal.    OTHER DATA REVIEWED: Nursing notes, vital signs, and past medical records reviewed. Prior records reviewed and indicate: 12/26/10 - Seen in ED for unspecified non infectious ganstroenteritis and colitis   DIAGNOSTIC STUDIES: Oxygen Saturation is 95% on room air, normal, by my interpretation.    LABS / RADIOLOGY:  Results for orders placed during the hospital encounter of 01/22/11  CBC      Component Value Range   WBC 12.3 (*) 4.0 - 10.5 (K/uL)   RBC 4.55  3.87 - 5.11 (MIL/uL)   Hemoglobin 13.3  12.0 - 15.0 (g/dL)   HCT 41.1  36.0 - 46.0 (%)   MCV 90.3  78.0 - 100.0 (fL)   MCH 29.2  26.0 - 34.0 (pg)   MCHC 32.4  30.0 - 36.0 (g/dL)   RDW 13.7  11.5 - 15.5 (%)   Platelets 360  150 - 400 (K/uL)  DIFFERENTIAL      Component Value Range   Neutrophils Relative 84 (*) 43 - 77 (%)   Neutro Abs 10.2 (*) 1.7 - 7.7 (K/uL)   Lymphocytes Relative 11 (*) 12 - 46 (%)   Lymphs Abs 1.4  0.7 - 4.0 (K/uL)   Monocytes Relative 4  3 - 12 (%)   Monocytes Absolute 0.5  0.1 - 1.0 (K/uL)   Eosinophils Relative 0  0 - 5 (%)   Eosinophils Absolute 0.0  0.0 - 0.7 (K/uL)   Basophils Relative 1  0 - 1 (%)   Basophils Absolute 0.1  0.0 - 0.1 (K/uL)  BASIC METABOLIC PANEL      Component Value Range   Sodium 135  135 - 145 (mEq/L)   Potassium 4.2  3.5 - 5.1 (mEq/L)   Chloride 99  96 - 112 (mEq/L)   CO2 23  19 - 32 (mEq/L)   Glucose, Bld 310 (*) 70 - 99 (mg/dL)   BUN 15  6 - 23 (mg/dL)   Creatinine, Ser 1.48 (*) 0.50 - 1.10 (mg/dL)   Calcium  10.1  8.4 - 10.5 (mg/dL)   GFR calc non Af Amer 39  (*) >60 (mL/min)   GFR calc Af Amer 48 (*) >60 (mL/min)  GLUCOSE, CAPILLARY      Component Value Range   Glucose-Capillary 350 (*) 70 - 99 (mg/dL)   Comment 1 Notify RN       ED COURSE / COORDINATION OF CARE: 15:30 - Patient evaluated by ED physician. Will hydrate, give IV Zofran and Protonix. Labs ordered.    MDM: Patient feeling better after IV fluids, Zofran, Protonix. No acute abdomen.   IMPRESSION: Diagnoses that have been ruled out:  Diagnoses that are still under consideration:  Final diagnoses:  Other and unspecified noninfectious gastroenteritis and colitis  Diabetes mellitus    PLAN:  Home The patient is to return the emergency department if there is any worsening of symptoms. I have reviewed the discharge instructions with the family.    CONDITION ON DISCHARGE: Stable   MEDICATIONS GIVEN IN THE E.D.  Medications  escitalopram (LEXAPRO) 10 MG tablet (not administered)  ranitidine (ZANTAC) 150 MG tablet (not administered)  ondansetron (ZOFRAN) 4 MG tablet (not administered)  ondansetron (ZOFRAN-ODT) disintegrating tablet 8 mg (8 mg Oral Given 01/22/11 1450)  sodium chloride 0.9 % bolus 1,000 mL (1000 mL Intravenous Given 01/22/11 1615)  ondansetron (ZOFRAN) injection 4 mg (4 mg Intravenous Given 01/22/11 1615)  pantoprazole (PROTONIX) injection 40 mg (40 mg Intravenous Given 01/22/11 1615)  sodium chloride 0.9 % bolus 1,000 mL (1000 mL Intravenous Given 01/22/11 1754)     DISCHARGE MEDICATIONS: New Prescriptions   ONDANSETRON (ZOFRAN) 4 MG TABLET    Take 1 tablet (4 mg total) by mouth every 6 (six) hours.   RANITIDINE (ZANTAC) 150 MG TABLET    Take 1 tablet (150 mg total) by mouth 2 (two) times daily.     Procedures     I personally performed the services described in this documentation, which was scribed in my presence. The recorded information has been reviewed and considered. No att. providers found    [name], MD 01/26/11 [place], MD 02/16/11 [id number]

## 2011-01-22 NOTE — ED Notes (Signed)
Pt states she is in no pain

## 2011-01-22 NOTE — ED Notes (Signed)
[facility] EN R[address]TE   223

## 2011-01-22 NOTE — ED Notes (Signed)
Patient father at bedside. Informed RN that patient went in to get blood drawn this morning for a fasting blood glucose. Patients father states that patient said that she felt bad this morning before and after getting her blood drawn and that she vomited after she got her blood drawn. Pt taken off blood pressure medication 1 week ago.

## 2011-01-22 NOTE — ED Notes (Signed)
Family at bedside. 

## 2011-01-22 NOTE — ED Notes (Signed)
Pt brought in by EMS for vomiting blood. Pt was pale with EMS. Pt arrived alert/oriented.  C/o  N/v dark brown blood x 1 since this am. Last normal bm last night-denies blood or black stools.  Denies pain. "a Little " sob. Generalized weakness observed. Pt color wnl at this time.

## 2011-01-23 ENCOUNTER — Encounter (HOSPITAL_COMMUNITY): Payer: Self-pay | Admitting: Emergency Medicine

## 2011-01-23 ENCOUNTER — Emergency Department (HOSPITAL_COMMUNITY)
Admission: EM | Admit: 2011-01-23 | Discharge: 2011-01-24 | Disposition: A | Discharge status: Home / Self Care | Payer: Medicare Other | Source: Home / Self Care | Attending: Emergency Medicine | Admitting: Emergency Medicine

## 2011-01-23 DIAGNOSIS — E079 Disorder of thyroid, unspecified: Secondary | ICD-10-CM | POA: Insufficient documentation

## 2011-01-23 DIAGNOSIS — I10 Essential (primary) hypertension: Secondary | ICD-10-CM | POA: Insufficient documentation

## 2011-01-23 DIAGNOSIS — Z79899 Other long term (current) drug therapy: Secondary | ICD-10-CM | POA: Insufficient documentation

## 2011-01-23 DIAGNOSIS — E119 Type 2 diabetes mellitus without complications: Secondary | ICD-10-CM | POA: Insufficient documentation

## 2011-01-23 DIAGNOSIS — R1084 Generalized abdominal pain: Secondary | ICD-10-CM

## 2011-01-23 DIAGNOSIS — K5289 Other specified noninfective gastroenteritis and colitis: Secondary | ICD-10-CM | POA: Insufficient documentation

## 2011-01-23 DIAGNOSIS — F17200 Nicotine dependence, unspecified, uncomplicated: Secondary | ICD-10-CM | POA: Insufficient documentation

## 2011-01-23 DIAGNOSIS — F319 Bipolar disorder, unspecified: Secondary | ICD-10-CM | POA: Insufficient documentation

## 2011-01-23 DIAGNOSIS — N289 Disorder of kidney and ureter, unspecified: Secondary | ICD-10-CM | POA: Insufficient documentation

## 2011-01-23 DIAGNOSIS — Z7982 Long term (current) use of aspirin: Secondary | ICD-10-CM | POA: Insufficient documentation

## 2011-01-23 DIAGNOSIS — Z794 Long term (current) use of insulin: Secondary | ICD-10-CM | POA: Insufficient documentation

## 2011-01-23 NOTE — ED Notes (Signed)
Patient was seen here last night for the same. Patient states ate an egg sandwich and pain became worse.

## 2011-01-23 NOTE — ED Provider Notes (Signed)
Scribed for [name], MD, the patient was seen in room 10. This chart was scribed by Pincus Badder. This patient's care was started at 23:39.    CSN: [id number] Arrival date & time: 01/23/2011  9:10 PM  Chief Complaint  Patient presents with  . Abdominal Pain   HPI Jasmine [name] is a 40 y.o. female with a history of DM, hypertension, thyroid disease, renal disorder, and bipolar disorder who presents to the Emergency Department complaining of persistent nausea and non-radiating, epigastric abdominal pain since yesterday. Patient was seen in the ED yesterday by Dr. Lacinda Axon for nausea/vomiting. She had blood work including CBC and BMP, and treated with 2L of IV fluids, Zofran, and Protonix at that time. Patient was discharged home with unspecified gastroenteritis and colitis and given Rx of Zantac and Zofran. Patient states she felt improved when she went home, but this AM she ate an egg sandwich and abdominal pain and nausea came back. She reports no improvement with Zofran. She denies any associated fever, vomiting, back pain, chest pain, shortness of breath, dysuria, or sore throat. There are no other associated symptoms and no other alleviating or aggravating factors.    HPI ELEMENTS:  Location: abdomen   Onset: yesterday  Timing: waxing and waning   Severity: 6/10  Modifying factors: exacerbated by eating   Context: as above  Associated symptoms: nausea     Past Medical History  Diagnosis Date  . Hypertension   . Diabetes mellitus   . Thyroid disease   . Bipolar disorder   . Renal disorder     Past Surgical History  Procedure Date  . Ovary removed   . Nasal fracture surgery    MEDICATIONS:  Previous Medications   ALPRAZOLAM (XANAX) 1 MG TABLET    Take 1 mg by mouth. Prn for anxiety    AMLODIPINE (NORVASC) 5 MG TABLET    Take 5 mg by mouth 2 (two) times daily.     ASPIRIN 81 MG TABLET    Take 81 mg by mouth daily.     ASPIRIN EC 81 MG TABLET    Take 81 mg by mouth  daily.     BUPROPION (WELLBUTRIN) 75 MG TABLET    Take 75 mg by mouth. q hs    CARVEDILOL (COREG) 25 MG TABLET    Take 25 mg by mouth 2 (two) times daily with a meal.     ESCITALOPRAM (LEXAPRO) 10 MG TABLET    Take 10 mg by mouth daily.     FUROSEMIDE (LASIX) 20 MG TABLET    Take 20 mg by mouth 2 (two) times daily. Prn edema    HYDROCHLOROTHIAZIDE 25 MG TABLET    Take 25 mg by mouth daily.     INSULIN GLARGINE (LANTUS) 100 UNIT/ML INJECTION    Inject 60 Units into the skin at bedtime.    INSULIN REGULAR (HUMULIN R,NOVOLIN R) 100 UNITS/ML INJECTION    Inject 20 Units into the skin 3 (three) times daily before meals. Sliding scale does not always use 20 nits 3 times a day   LAMOTRIGINE (LAMICTAL) 200 MG TABLET    Take 200 mg by mouth at bedtime.    LEVOTHYROXINE (SYNTHROID, LEVOTHROID) 88 MCG TABLET    Take 88 mcg by mouth daily.     LISINOPRIL (PRINIVIL,ZESTRIL) 20 MG TABLET    Take 20 mg by mouth 2 (two) times daily.     MIRTAZAPINE (REMERON) 30 MG TABLET    Take 30 mg  by mouth at bedtime.     MULTIPLE VITAMIN (MULTIVITAMIN) TABLET    Take 1 tablet by mouth daily.     ONDANSETRON (ZOFRAN) 4 MG TABLET    Take 1 tablet (4 mg total) by mouth every 6 (six) hours.   PRAVASTATIN (PRAVACHOL) 10 MG TABLET    Take 10 mg by mouth at bedtime. q hs   RANITIDINE (ZANTAC) 150 MG TABLET    Take 1 tablet (150 mg total) by mouth 2 (two) times daily.   ZIPRASIDONE (GEODON) 60 MG CAPSULE    Take 60 mg by mouth at bedtime.      ALLERGIES:  Allergies as of 01/23/2011 - Review Complete 01/23/2011  Allergen Reaction Noted  . Levaquin  12/26/2010    FAMILY HISTORY:  No Pertinent Family History  SOCIAL HISTORY: Accompanied to the ED by family  History  Substance Use Topics  . Smoking status: Current Everyday Smoker  . Smokeless tobacco: Not on file  . Alcohol Use: No    Review of Systems  Constitutional: Negative for fever.  HENT: Positive for rhinorrhea (chronic, unchanged ). Negative for sore throat.    Respiratory: Negative for shortness of breath.   Cardiovascular: Negative for chest pain.  Gastrointestinal: Positive for nausea and vomiting.  Genitourinary: Negative for dysuria and flank pain.  Musculoskeletal: Positive for back pain (one episode yesterday, resolved ).  Skin: Negative for rash.  All other systems reviewed and are negative.   Physical Exam  BP 171/76  Pulse 73  Temp(Src) 99 F (37.2 C) (Oral)  Resp 18  Ht 5\' 9"  (1.753 m)  Wt 264 lb (119.75 kg)  BMI 38.99 kg/m2  SpO2 96%  Physical Exam  Nursing note and vitals reviewed. Constitutional: She is oriented to person, place, and time. She appears well-developed and well-nourished. No distress.  HENT:  Head: Normocephalic and atraumatic.  Mouth/Throat: Oropharynx is clear and moist.  Eyes: Conjunctivae and EOM are normal. Pupils are equal, round, and reactive to light.  Neck: Normal range of motion. Neck supple.  Cardiovascular: Normal rate, regular rhythm, normal heart sounds and intact distal pulses.   Pulmonary/Chest: Effort normal and breath sounds normal.  Abdominal: Soft. Bowel sounds are normal. She exhibits no distension. There is tenderness (diffuse/generalized mild ).  Musculoskeletal: Normal range of motion. She exhibits no edema and no tenderness.  Neurological: She is alert and oriented to person, place, and time. She has normal strength. No cranial nerve deficit (2-12 intact ). Coordination normal.  Skin: Skin is warm and dry.  Psychiatric: She has a normal mood and affect.    Procedures  OTHER DATA REVIEWED: Nursing notes, vital signs, and past medical records reviewed. Prior records reviewed and indicate: 01/22/11 - Seen in ED by Dr. Lacinda Axon for nausea/vomiting. Discharged with unspecified gastroenteritis and colitis.   DIAGNOSTIC STUDIES: Oxygen Saturation is 96% on room air, normal by my interpretation.    LABS / RADIOLOGY:   Results for orders placed during the hospital encounter of  01/23/11  CBC      Component Value Range   WBC 13.5 (*) 4.0 - 10.5 (K/uL)   RBC 4.39  3.87 - 5.11 (MIL/uL)   Hemoglobin 13.2  12.0 - 15.0 (g/dL)   HCT 39.5  36.0 - 46.0 (%)   MCV 90.0  78.0 - 100.0 (fL)   MCH 30.1  26.0 - 34.0 (pg)   MCHC 33.4  30.0 - 36.0 (g/dL)   RDW 13.5  11.5 - 15.5 (%)   Platelets  414 (*) 150 - 400 (K/uL)  COMPREHENSIVE METABOLIC PANEL      Component Value Range   Sodium 131 (*) 135 - 145 (mEq/L)   Potassium 4.3  3.5 - 5.1 (mEq/L)   Chloride 95 (*) 96 - 112 (mEq/L)   CO2 22  19 - 32 (mEq/L)   Glucose, Bld 380 (*) 70 - 99 (mg/dL)   BUN 22  6 - 23 (mg/dL)   Creatinine, Ser 1.53 (*) 0.50 - 1.10 (mg/dL)   Calcium 9.4  8.4 - 10.5 (mg/dL)   Total Protein 7.0  6.0 - 8.3 (g/dL)   Albumin 2.7 (*) 3.5 - 5.2 (g/dL)   AST 8  0 - 37 (U/L)   ALT 9  0 - 35 (U/L)   Alkaline Phosphatase 91  39 - 117 (U/L)   Total Bilirubin 0.3  0.3 - 1.2 (mg/dL)   GFR calc non Af Amer 38 (*) >60 (mL/min)   GFR calc Af Amer 46 (*) >60 (mL/min)  LIPASE, BLOOD      Component Value Range   Lipase 23  11 - 59 (U/L)  URINALYSIS, ROUTINE W REFLEX MICROSCOPIC      Component Value Range   Color, Urine YELLOW  YELLOW    Appearance CLEAR  CLEAR    Specific Gravity, Urine 1.020  1.005 - 1.030    pH 6.0  5.0 - 8.0    Glucose, UA >1000 (*) NEGATIVE (mg/dL)   Hgb urine dipstick MODERATE (*) NEGATIVE    Bilirubin Urine NEGATIVE  NEGATIVE    Ketones, ur 15 (*) NEGATIVE (mg/dL)   Protein, ur 100 (*) NEGATIVE (mg/dL)   Urobilinogen, UA 0.2  0.0 - 1.0 (mg/dL)   Nitrite NEGATIVE  NEGATIVE    Leukocytes, UA NEGATIVE  NEGATIVE   URINE MICROSCOPIC-ADD ON      Component Value Range   Squamous Epithelial / LPF RARE  RARE    WBC, UA 3-6  <3 (WBC/hpf)   RBC / HPF 3-6  <3 (RBC/hpf)   Bacteria, UA MANY (*) RARE      CXR:  View; Interpreted by Radiologist and reviewed by me:   CT Abdomen / Pelvis: Interpreted by Radiologist    ED COURSE / COORDINATION OF CARE: 23:40  - Patient evaluated by ED  physician, labs, IVF, Zofran/Dilaudid, CXR, and CT abdomen ordered.    MDM: CT SCAN FOR PERSISTENT EPIGASTRIC PAIN PENDING, WBC AND ELECTROLYTES WITHOUT SIG CHANGES.    IMPRESSION: Diagnoses that have been ruled out:  Diagnoses that are still under consideration:  Final diagnoses:    PLAN:  Home  The patient is to return the emergency department if there is any worsening of symptoms. I have reviewed the discharge instructions with the patient and family.   CONDITION ON DISCHARGE:   MEDICATIONS GIVEN IN THE E.D.  Medications  aspirin EC 81 MG tablet (not administered)  0.9 %  sodium chloride infusion (not administered)  sodium chloride 0.9 % bolus 250 mL (not administered)  ondansetron (ZOFRAN) injection 4 mg (not administered)  HYDROmorphone (DILAUDID) injection 1 mg (not administered)    DISCHARGE MEDICATIONS: New Prescriptions   No medications on file     I personally performed the services described in this documentation, which was scribed in my presence. The recorded information has been reviewed and considered. [name], MD      [name], MD 01/24/11 [phone]

## 2011-01-24 ENCOUNTER — Emergency Department (HOSPITAL_COMMUNITY): Payer: Medicare Other

## 2011-01-24 LAB — URINALYSIS, ROUTINE W REFLEX MICROSCOPIC
Ketones, ur: 15 mg/dL — AB
Leukocytes, UA: NEGATIVE
Nitrite: NEGATIVE
Specific Gravity, Urine: 1.02 (ref 1.005–1.030)
pH: 6 (ref 5.0–8.0)

## 2011-01-24 LAB — CBC
HCT: 39.5 % (ref 36.0–46.0)
MCHC: 33.4 g/dL (ref 30.0–36.0)
MCV: 90 fL (ref 78.0–100.0)
RDW: 13.5 % (ref 11.5–15.5)

## 2011-01-24 LAB — COMPREHENSIVE METABOLIC PANEL
Albumin: 2.7 g/dL — ABNORMAL LOW (ref 3.5–5.2)
BUN: 22 mg/dL (ref 6–23)
Calcium: 9.4 mg/dL (ref 8.4–10.5)
Creatinine, Ser: 1.53 mg/dL — ABNORMAL HIGH (ref 0.50–1.10)
Total Bilirubin: 0.3 mg/dL (ref 0.3–1.2)
Total Protein: 7 g/dL (ref 6.0–8.3)

## 2011-01-24 LAB — LIPASE, BLOOD: Lipase: 23 U/L (ref 11–59)

## 2011-01-24 LAB — URINE MICROSCOPIC-ADD ON

## 2011-01-24 MED ORDER — PROMETHAZINE HCL 25 MG PO TABS: 25.0000 mg | ORAL_TABLET | Freq: Four times a day (QID) | ORAL | Status: DC | PRN

## 2011-01-24 MED ORDER — SODIUM CHLORIDE 0.9 % IV BOLUS (SEPSIS)
250.0000 mL | Freq: Once | INTRAVENOUS | Status: AC
  Administered 2011-01-24: 1000 mL via INTRAVENOUS

## 2011-01-24 MED ORDER — SODIUM CHLORIDE 0.9 % IV SOLN: INTRAVENOUS | Status: DC

## 2011-01-24 MED ORDER — HYDROMORPHONE HCL 1 MG/ML IJ SOLN
1.0000 mg | Freq: Once | INTRAMUSCULAR | Status: AC
  Administered 2011-01-24: 1 mg via INTRAVENOUS
  Filled 2011-01-24: qty 1

## 2011-01-24 MED ORDER — ONDANSETRON HCL 4 MG/2ML IJ SOLN
4.0000 mg | Freq: Once | INTRAMUSCULAR | Status: AC
  Administered 2011-01-24: 4 mg via INTRAVENOUS
  Filled 2011-01-24: qty 2

## 2011-01-24 MED ORDER — IOHEXOL 300 MG/ML  SOLN
80.0000 mL | Freq: Once | INTRAMUSCULAR | Status: AC | PRN
  Administered 2011-01-24: 80 mL via INTRAVENOUS

## 2011-01-24 NOTE — Progress Notes (Signed)
0141 Assumed care/disposition of patient. Awaiting CT results. Patient has been seen on several occasions for abdominal pain. [id number] CT results reviewed with patient. No acute process noted in the CT. Patient has medicine for pain and nausea at home. Will provide phenergan in addition to zofran.

## 2011-01-24 NOTE — ED Notes (Signed)
Patient has completed her contrast; CT notified.

## 2011-01-24 NOTE — ED Notes (Signed)
Patient transported to CT via stretcher.

## 2011-01-27 ENCOUNTER — Inpatient Hospital Stay (HOSPITAL_COMMUNITY)
Admission: AD | Admit: 2011-01-27 | Discharge: 2011-01-29 | DRG: 872 | Disposition: A | Discharge status: Home / Self Care | Payer: Medicare Other | Source: Ambulatory Visit | Attending: Internal Medicine | Admitting: Internal Medicine

## 2011-01-27 ENCOUNTER — Inpatient Hospital Stay: Admit: 2011-01-27 | Payer: Self-pay | Admitting: Internal Medicine

## 2011-01-27 DIAGNOSIS — N83209 Unspecified ovarian cyst, unspecified side: Secondary | ICD-10-CM | POA: Diagnosis present

## 2011-01-27 DIAGNOSIS — E1039 Type 1 diabetes mellitus with other diabetic ophthalmic complication: Secondary | ICD-10-CM | POA: Diagnosis present

## 2011-01-27 DIAGNOSIS — Z79899 Other long term (current) drug therapy: Secondary | ICD-10-CM

## 2011-01-27 DIAGNOSIS — R7881 Bacteremia: Principal | ICD-10-CM | POA: Diagnosis present

## 2011-01-27 DIAGNOSIS — E039 Hypothyroidism, unspecified: Secondary | ICD-10-CM | POA: Diagnosis present

## 2011-01-27 DIAGNOSIS — N183 Chronic kidney disease, stage 3 unspecified: Secondary | ICD-10-CM | POA: Diagnosis present

## 2011-01-27 DIAGNOSIS — I129 Hypertensive chronic kidney disease with stage 1 through stage 4 chronic kidney disease, or unspecified chronic kidney disease: Secondary | ICD-10-CM | POA: Diagnosis present

## 2011-01-27 DIAGNOSIS — G4733 Obstructive sleep apnea (adult) (pediatric): Secondary | ICD-10-CM | POA: Diagnosis present

## 2011-01-27 DIAGNOSIS — Z794 Long term (current) use of insulin: Secondary | ICD-10-CM

## 2011-01-27 DIAGNOSIS — E1065 Type 1 diabetes mellitus with hyperglycemia: Secondary | ICD-10-CM | POA: Diagnosis present

## 2011-01-27 DIAGNOSIS — D72829 Elevated white blood cell count, unspecified: Secondary | ICD-10-CM | POA: Diagnosis present

## 2011-01-27 DIAGNOSIS — F319 Bipolar disorder, unspecified: Secondary | ICD-10-CM | POA: Diagnosis present

## 2011-01-27 DIAGNOSIS — E7800 Pure hypercholesterolemia, unspecified: Secondary | ICD-10-CM | POA: Diagnosis present

## 2011-01-27 DIAGNOSIS — E871 Hypo-osmolality and hyponatremia: Secondary | ICD-10-CM | POA: Diagnosis present

## 2011-01-27 DIAGNOSIS — N12 Tubulo-interstitial nephritis, not specified as acute or chronic: Secondary | ICD-10-CM | POA: Diagnosis present

## 2011-01-27 DIAGNOSIS — B961 Klebsiella pneumoniae [K. pneumoniae] as the cause of diseases classified elsewhere: Secondary | ICD-10-CM | POA: Diagnosis present

## 2011-01-27 DIAGNOSIS — E11319 Type 2 diabetes mellitus with unspecified diabetic retinopathy without macular edema: Secondary | ICD-10-CM | POA: Diagnosis present

## 2011-01-27 DIAGNOSIS — E1029 Type 1 diabetes mellitus with other diabetic kidney complication: Secondary | ICD-10-CM | POA: Diagnosis present

## 2011-01-27 DIAGNOSIS — F17200 Nicotine dependence, unspecified, uncomplicated: Secondary | ICD-10-CM | POA: Diagnosis present

## 2011-01-27 DIAGNOSIS — N058 Unspecified nephritic syndrome with other morphologic changes: Secondary | ICD-10-CM | POA: Diagnosis present

## 2011-01-27 LAB — URINALYSIS, MICROSCOPIC ONLY
Bilirubin Urine: NEGATIVE
Specific Gravity, Urine: 1.008 (ref 1.005–1.030)
Urobilinogen, UA: 0.2 mg/dL (ref 0.0–1.0)

## 2011-01-27 LAB — DIFFERENTIAL
Basophils Absolute: 0.1 10*3/uL (ref 0.0–0.1)
Basophils Relative: 0 % (ref 0–1)
Monocytes Relative: 9 % (ref 3–12)
Neutro Abs: 14.5 10*3/uL — ABNORMAL HIGH (ref 1.7–7.7)
Neutrophils Relative %: 81 % — ABNORMAL HIGH (ref 43–77)

## 2011-01-27 LAB — CBC
Hemoglobin: 11.9 g/dL — ABNORMAL LOW (ref 12.0–15.0)
RBC: 3.95 MIL/uL (ref 3.87–5.11)
WBC: 18 10*3/uL — ABNORMAL HIGH (ref 4.0–10.5)

## 2011-01-28 LAB — COMPREHENSIVE METABOLIC PANEL
ALT: 10 U/L (ref 0–35)
AST: 8 U/L (ref 0–37)
Albumin: 2.3 g/dL — ABNORMAL LOW (ref 3.5–5.2)
CO2: 24 mEq/L (ref 19–32)
Chloride: 94 mEq/L — ABNORMAL LOW (ref 96–112)
GFR calc non Af Amer: 38 mL/min — ABNORMAL LOW (ref >60)
Potassium: 4.2 mEq/L (ref 3.5–5.1)
Sodium: 127 mEq/L — ABNORMAL LOW (ref 135–145)
Total Bilirubin: 0.3 mg/dL (ref 0.3–1.2)

## 2011-01-28 LAB — GLUCOSE, CAPILLARY
Glucose-Capillary: 352 mg/dL — ABNORMAL HIGH (ref 70–99)
Glucose-Capillary: 138 mg/dL — ABNORMAL HIGH (ref 70–99)
Glucose-Capillary: 167 mg/dL — ABNORMAL HIGH (ref 70–99)
Glucose-Capillary: 248 mg/dL — ABNORMAL HIGH (ref 70–99)

## 2011-01-28 NOTE — H&P (Signed)
Jasmine [name], FAUSEY NO.:  [id number]  MEDICAL RECORD NO.:  [id number]  LOCATION:  [id number]                         FACILITY:  [facility]  PHYSICIAN:  Ardyth Gal, MD    DATE OF BIRTH:  [birth year]  DATE OF ADMISSION:  01/27/2011 DATE OF DISCHARGE:                             HISTORY & PHYSICAL   CHIEF COMPLAINT:  UTI.  HISTORY OF PRESENT ILLNESS:  The patient is a 40 year old white female, disabled from kidney disease and bipolar disorder, who was sent here by her PCP, Bing Matter, PA, from [facility] due to worsening leukocytosis secondary to UTI/pyelonephritis, associated with severe abdominal pain, chills, and nausea without vomiting.  The patient was seen in [facility] ER this past weekend for gastroenteritis where she underwent a chest x-ray as well as a CT scan of the abdomen and pelvis with contrast, and she was found with perinephric edema bilaterally along the Gerota fascia and a right ovarian cyst.  She was not told the results until today when [name], [facility], reviewed her imaging studies and subsequently sent her to the hospital for direct admission.  PAST MEDICAL HISTORY: 1. Type 1 diabetes mellitus diagnosed at age 31 and immediately placed     on an insulin pump but due to lack of insurance coverage, she went     on regular basal/bolus insulin, complicated by;     a.     Retinopathy.     b.     Nephropathy (stage IV kidney disease). 2. Essential hypertension. 3. Hypercholesterolemia. 4. Obstructive sleep apnea, not on treatment. 5. Hypothyroidism. 6. Bipolar disorder diagnosed in 2009, with two admissions to [facility], one for suicidal attempt and second     suicidal ideation.  MEDICATIONS: 1. NovoLog insulin 10-15 units 3 times a day before meals. 2. Lantus 60 units at bedtime. 3. Folic acid 1 mg every morning. 4. Vitamin D3 OTC every morning. 5. Fish oil OTC 1 tablet every morning. 6. Geodon 60 mg  daily. 7. Mirtazapine 45 mg every morning. 8. Lexapro 10 mg every morning. 9. Xanax 1 mg 3 times a day p.r.n. 10.Pravastatin 10 mg a day. 11.Lisinopril 20 mg twice a day. 12.Levothyroxine 88 mcg a day. 13.Lamotrigine 100 mg every evening. 14.Coreg 25 mg b.i.d.  ALLERGIES:  LEVOFLOXACIN causes generalized and diffuse body aches.  SOCIAL HISTORY:  The patient lives with her father in [place].  She is separated from her husband.  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  She smokes 1/2 a pack per day.  Denies alcohol or drugs.  FAMILY HISTORY:  The mother died at the age of 26 from sepsis complications.  The father is alive and suffers from hypertension, CHF, diabetes mellitus type 2, and hypercholesterolemia.  She has 1 brother who appears to be healthy.  REVIEW OF SYSTEMS:  CONSTITUTIONAL:  Positive for chills.  Denies fevers, sweats, weight changes, or adenopathy.  HEENT:  Denies headaches, sore throat, nasal discharge, bleeds, voice changes, vertigo, photophobia, vision or hearing loss.  She wears contact lenses.  SKIN: Denies rashes or growth.  CARDIOPULMONARY:  Denies chest pain, shortness  of breath, DOE, orthopnea, PND, edema, palpitations, syncope, claudication, cough, or wheezing.  GU:  Positive for dysuria, denies frequency, urgency, or straining.  Denies hematuria, nocturia, or menopause.  Last menstrual period __________.  NEUROPSYCHIATRIC:  She feels okay.  She denies weakness, numbness, mood disturbances, depression, or anxiety lately.  MUSCULOSKELETAL:  She complains of severe low back pain as well as right plantar fasciitis pain.  Denies joint swelling.  GI:  Positive for nausea without vomiting, no diarrhea, although she had a gastroenteritis this past weekend.  Denies bright blood per rectum, melena, or hematemesis.  Denies reflux.  Denies polyuria, polydipsia, heat or cold intolerance, skin or hair  changes.  PHYSICAL EXAMINATION:  VITAL SIGNS:  Temperature 100.8, pulse 89, respirations 20, BP 145/66, pulse ox 92% on room air. GENERAL APPEARANCE:  The patient appears obese and uncomfortable due to pain. HEENT:  [place]/AT.  PERRLA.  EOMI.  Sclerae clear.  Good oral dentition. NECK:  No bruits or JVD. CARDIOVASCULAR:  Regular S1 and S2 without gallops, murmurs, or rubs. Unable to palpate PMI due to body habitus. LUNGS:  Clear to auscultation. ABDOMEN:  Nondistended with normal bowel sounds and it is exquisitely tender in all 4 quadrants with rebounding tenderness.  No hepatosplenomegaly.  There is positive right CVA tenderness. MUSCULOSKELETAL:  Pain on the right calcaneus. NEUROLOGIC:  Awake and oriented x3.  Cranial nerves II-XII grossly intact.  Strength 5/5 in all extremities.  Normal sensation throughout. Normal cerebellar function. SKIN:  No rash or lesions. RECTAL AND GENITOURINARY:  Deferred. EXTREMITIES:  No clubbing, cyanosis, edema, rash, lesions, or petechiae.  DIAGNOSTICS:  CT abdomen and pelvis with contrast, perinephric edema bilaterally and along the Gerota fascia as well as right ovarian cyst.  LABS:  Pending.  EKG pending. ASSESSMENT AND PLAN: 39. A 40 year old white female with type 1 diabetes, uncontrolled with     a hemoglobin A1c of 12.9 per outpatient records which I reviewed     from the office of [place] at     [place], complicated by retinopathy and nephropathy stage IV,     admitted for urinary tract infection/possible pyelonephritis. 2. Diabetes mellitus type 1, uncontrolled.  PLAN:  Admit to inpatient regular bed.  Assigned to Team 1.  She is full code.  We will monitor her vital signs q.shift.  We will obtain a CMET, CBC with diff, UA with urine culture and blood cultures x2, EKG due to a number of psychotropic medications that she is currently taking.  IV normal saline at 125 mL an hour.  DVT prophylaxis with  Lovenox 40 mg subcu q.24 hours, pharmacy may adjust, Zofran 4 mg as needed as well as Mylanta and Fleet's enema for constipation, aspirin 81 mg a day. Continue on all of her home medications except for the insulin aspart (see insulin sliding scale).  Pain will be managed with hydrocodone/APAP 5/325, 1-2 tablets q.4 hours p.r.n. for moderate pain and Dilaudid 0.5 mg IV q.4 hours p.r.n. for severe pain.  We will ask for a tobacco cessation consult and an inpatient diabetes coordinator consult.  We will start her on Rocephin, dose per pharmacy and for her diabetes, she will be on Lantus 60 units subcu at bedtime with CBG scheduled t.i.d. with meal and at bedtime with at bedtime insulin coverage, resistant level with NovoLog subcutaneous and meal coverage with 6 units of NovoLog subcutaneous.          ______________________________ Ardyth Gal, MD     GL/MEDQ  D:  01/27/2011  T:  01/27/2011  Job:  [id number]  cc:   Bing Matter, PA  Electronically Signed by Ardyth Gal MD on 01/28/2011 01:05:10 PM

## 2011-01-29 LAB — CBC
HCT: 33.1 % — ABNORMAL LOW (ref 36.0–46.0)
Hemoglobin: 10.9 g/dL — ABNORMAL LOW (ref 12.0–15.0)
MCH: 29.5 pg (ref 26.0–34.0)
Platelets: 381 10*3/uL (ref 150–400)
RBC: 3.7 MIL/uL — ABNORMAL LOW (ref 3.87–5.11)

## 2011-01-29 LAB — BASIC METABOLIC PANEL
BUN: 19 mg/dL (ref 6–23)
Calcium: 8.6 mg/dL (ref 8.4–10.5)
Chloride: 102 mEq/L (ref 96–112)
Creatinine, Ser: 1.62 mg/dL — ABNORMAL HIGH (ref 0.50–1.10)
GFR calc Af Amer: 43 mL/min — ABNORMAL LOW (ref >60)
GFR calc non Af Amer: 35 mL/min — ABNORMAL LOW (ref >60)

## 2011-01-29 LAB — URINE CULTURE: Special Requests: NEGATIVE

## 2011-01-29 LAB — GLUCOSE, CAPILLARY: Glucose-Capillary: 130 mg/dL — ABNORMAL HIGH (ref 70–99)

## 2011-01-29 NOTE — Discharge Summary (Signed)
NAMECANDELA, DESAULNIERS NO.:  [id number]  MEDICAL RECORD NO.:  [id number]  LOCATION:                                 FACILITY:  PHYSICIAN:  Lottie Dawson, MD       DATE OF BIRTH:  Jun 02, 1971  DATE OF ADMISSION:  01/27/2011 DATE OF DISCHARGE:  01/29/2011                              DISCHARGE SUMMARY   PRIMARY CARE PHYSICIAN:  Bing Matter, PA.  ENDOCRINOLOGIST:  Dr. Posey Pronto.  DISCHARGE DIAGNOSES: 1. Klebsiella Pneumoniae urinary tract infection with pyelonephritis. 2. Gram-negative rod bacteremia. 3. Fever. 4. Leukocytosis. 5. Type 1 diabetes. 6. Hypertension. 7. Hyperlipidemia. 8. Hypothyroidism. 9. History of bipolar disorder. 10.Hyponatremia. 11.Chronic kidney disease stage III. 12.History of obstructive sleep apnea, not on treatment. 13.History of bipolar disorder, diagnosed in 2009. 14.Right ovarian cyst.  DISCHARGE MEDICATIONS: 1. Cefuroxime 500 mg p.o. twice daily with meals for 7 days to     complete a 10-day course of antibiotics. 2. Carvedilol 25 mg p.o. twice daily. 3. Fish oil one capsule p.o. q.a.m. 4. Folic acid 1 mg p.o. q.a.m. 5. Geodon 60 mg p.o. daily 6. Lantus 60 mg p.o. daily at bedtime. 7. Lexapro 10 mg p.o. q.a.m. 8. Lamotrigine 100 mg p.o. q.p.m. 9. Levothyroxine 88 mcg p.o. q.a.m. 10.Lisinopril 20 mg p.o. twice daily. 11.Mirtazapine 45 mg p.o. q.a.m. 12.NovoLog 10-15 units subcu three times a day before meals. 13.Pravastatin 10 mg p.o. daily at bedtime. 14.Vitamin D3 over-the-counter p.o. q.a.m. 15.Xanax 1 mg three times a day as needed.  BRIEF ADMITTING HISTORY AND PHYSICAL:  Ms. Geoffry Paradise is a 40 year old Caucasian female with history of chronic kidney disease stage III, bipolar disorder, type 1 diabetes, who presented from her PCP, Bing Matter, PA due to fevers and urinary tract infection at her PCP's office.  RADIOLOGY/IMAGING:  The patient had chest x-ray two-view on January 24, 2011, which showed mildly  hyperexpanded lungs with mild bibasilar atelectasis.  The patient had CT of the abdomen and pelvis with contrast, which showed significant edema, is identified in the perinephric space along Gerota's fascia bilaterally, tracking inferiorly along minimally prominent ureters, probable right ovarian cyst.  LABORATORY DATA:  CBC shows a white count of 11.8, hemoglobin 10.9, hematocrit 33.1, platelet count 381.  Initial white count on presentation was 18.0.  Electrolytes normal, except BUN is 19, creatinine 1.62.  Initial creatinine on presentation was 1.54.  Liver function tests normal except albumin is 2.3.  UA was negative for nitrates, had moderate leukocytes.  Blood cultures x2 one out of two bottles grew Gram-negative rods.  Urine culture grew greater than 10,000 colonies of Gram-negative rods.  HOSPITAL COURSE BY PROBLEM: 1. Klebsiella Pneumoniae urinary tract infection with pyelonephritis.     Initially on presentation, the patient was started on ceftriaxone.     During the course hospital stay, her antibiotics were transitioned     to cefuroxime.  The patient received three doses of ceftriaxone     during the course hospital stay.  The patient will continue     cefuroxime for 7 more days to complete at least a 10-day course of     antibiotics. Klebsiella Pneumoniae sensitive to cefriaxone and  ciprofloxacin. 2. Gram-negative rod bacteremia.  The patient to complete 7 more days     of antibiotics to complete a 10-day course.  Initially on     admission, the patient was febrile on January 27, 2011, since then     her fever has improved. 3. Fever likely due to urinary tract infection and bacteremia,     resolved during the course of hospital stay. 4. Leukocytosis likely due to urinary tract infection and bacteremia,     improved. 5. Type 1 diabetes.  The patient was continued on home insulin     regimen.  The patient indicated that she will follow up with her      endocrinologist, Dr. Posey Pronto as an outpatient for consideration of     insulin pump. 6. Hypertension, stable.  Continue the patient on home medications. 7. Hyperlipidemia.  Continue the patient on statin. 8. Hypothyroidism.  Continue the patient on levothyroxine. 9. History of bipolar disorder, stable. 10.Hyponatremia likely due to uncontrolled diabetes, resolved during     the course of hospital stay.  DISPOSITION AND FOLLOWUP:  The patient was instructed to follow up with her primary care physician on February 01, 2011 for further evaluation.  Time spent on discharge, talking to the patient and coordinating care was 25 minutes.   Lottie Dawson, MD   SR/MEDQ  D:  01/29/2011  T:  01/29/2011  Job:  [id number]  Electronically Signed by Alveta Heimlich Nicco Reaume  on 01/29/2011 08:52:54 PM

## 2011-01-30 LAB — CULTURE, BLOOD (ROUTINE X 2)

## 2011-02-03 ENCOUNTER — Inpatient Hospital Stay (HOSPITAL_COMMUNITY)
Admission: EM | Admit: 2011-02-03 | Discharge: 2011-02-07 | DRG: 690 | Disposition: A | Discharge status: Home / Self Care | Payer: Medicare Other | Attending: Internal Medicine | Admitting: Internal Medicine

## 2011-02-03 DIAGNOSIS — Z79899 Other long term (current) drug therapy: Secondary | ICD-10-CM

## 2011-02-03 DIAGNOSIS — F319 Bipolar disorder, unspecified: Secondary | ICD-10-CM | POA: Diagnosis present

## 2011-02-03 DIAGNOSIS — D72829 Elevated white blood cell count, unspecified: Secondary | ICD-10-CM | POA: Diagnosis present

## 2011-02-03 DIAGNOSIS — N10 Acute tubulo-interstitial nephritis: Principal | ICD-10-CM | POA: Diagnosis present

## 2011-02-03 DIAGNOSIS — G4733 Obstructive sleep apnea (adult) (pediatric): Secondary | ICD-10-CM | POA: Diagnosis present

## 2011-02-03 DIAGNOSIS — E039 Hypothyroidism, unspecified: Secondary | ICD-10-CM | POA: Diagnosis present

## 2011-02-03 DIAGNOSIS — N189 Chronic kidney disease, unspecified: Secondary | ICD-10-CM | POA: Diagnosis present

## 2011-02-03 DIAGNOSIS — E785 Hyperlipidemia, unspecified: Secondary | ICD-10-CM | POA: Diagnosis present

## 2011-02-03 DIAGNOSIS — N83209 Unspecified ovarian cyst, unspecified side: Secondary | ICD-10-CM | POA: Diagnosis present

## 2011-02-03 DIAGNOSIS — Z9641 Presence of insulin pump (external) (internal): Secondary | ICD-10-CM

## 2011-02-03 DIAGNOSIS — E109 Type 1 diabetes mellitus without complications: Secondary | ICD-10-CM | POA: Diagnosis present

## 2011-02-03 DIAGNOSIS — M549 Dorsalgia, unspecified: Secondary | ICD-10-CM | POA: Diagnosis present

## 2011-02-03 DIAGNOSIS — I129 Hypertensive chronic kidney disease with stage 1 through stage 4 chronic kidney disease, or unspecified chronic kidney disease: Secondary | ICD-10-CM | POA: Diagnosis present

## 2011-02-03 DIAGNOSIS — F17200 Nicotine dependence, unspecified, uncomplicated: Secondary | ICD-10-CM | POA: Diagnosis present

## 2011-02-03 DIAGNOSIS — G8929 Other chronic pain: Secondary | ICD-10-CM | POA: Diagnosis present

## 2011-02-03 LAB — CULTURE, BLOOD (ROUTINE X 2): Culture: NO GROWTH

## 2011-02-04 ENCOUNTER — Emergency Department (HOSPITAL_COMMUNITY): Payer: Medicare Other

## 2011-02-04 ENCOUNTER — Encounter (HOSPITAL_COMMUNITY): Payer: Self-pay

## 2011-02-04 DIAGNOSIS — B9689 Other specified bacterial agents as the cause of diseases classified elsewhere: Secondary | ICD-10-CM

## 2011-02-04 DIAGNOSIS — N12 Tubulo-interstitial nephritis, not specified as acute or chronic: Secondary | ICD-10-CM

## 2011-02-04 LAB — DIFFERENTIAL
Basophils Relative: 1 % (ref 0–1)
Eosinophils Relative: 4 % (ref 0–5)
Lymphocytes Relative: 20 % (ref 12–46)
Monocytes Relative: 5 % (ref 3–12)
Neutrophils Relative %: 70 % (ref 43–77)

## 2011-02-04 LAB — CBC
HCT: 35.2 % — ABNORMAL LOW (ref 36.0–46.0)
Hemoglobin: 11.9 g/dL — ABNORMAL LOW (ref 12.0–15.0)
MCV: 88.9 fL (ref 78.0–100.0)
RBC: 3.96 MIL/uL (ref 3.87–5.11)
RDW: 13.4 % (ref 11.5–15.5)
WBC: 20.6 10*3/uL — ABNORMAL HIGH (ref 4.0–10.5)

## 2011-02-04 LAB — URINALYSIS, ROUTINE W REFLEX MICROSCOPIC
Bilirubin Urine: NEGATIVE
Hgb urine dipstick: NEGATIVE
Specific Gravity, Urine: 1.018 (ref 1.005–1.030)
Urobilinogen, UA: 0.2 mg/dL (ref 0.0–1.0)
pH: 5.5 (ref 5.0–8.0)

## 2011-02-04 LAB — COMPREHENSIVE METABOLIC PANEL
AST: 14 U/L (ref 0–37)
CO2: 29 mEq/L (ref 19–32)
Calcium: 9.2 mg/dL (ref 8.4–10.5)
Creatinine, Ser: 2 mg/dL — ABNORMAL HIGH (ref 0.50–1.10)
GFR calc Af Amer: 34 mL/min — ABNORMAL LOW (ref >60)
GFR calc non Af Amer: 28 mL/min — ABNORMAL LOW (ref >60)
Glucose, Bld: 307 mg/dL — ABNORMAL HIGH (ref 70–99)
Sodium: 132 mEq/L — ABNORMAL LOW (ref 135–145)
Total Protein: 6.7 g/dL (ref 6.0–8.3)

## 2011-02-04 LAB — GLUCOSE, CAPILLARY
Glucose-Capillary: 266 mg/dL — ABNORMAL HIGH (ref 70–99)
Glucose-Capillary: 262 mg/dL — ABNORMAL HIGH (ref 70–99)

## 2011-02-04 LAB — URINE MICROSCOPIC-ADD ON

## 2011-02-05 DIAGNOSIS — B9689 Other specified bacterial agents as the cause of diseases classified elsewhere: Secondary | ICD-10-CM

## 2011-02-05 DIAGNOSIS — N12 Tubulo-interstitial nephritis, not specified as acute or chronic: Secondary | ICD-10-CM

## 2011-02-05 LAB — DIFFERENTIAL
Eosinophils Relative: 5 % (ref 0–5)
Lymphocytes Relative: 29 % (ref 12–46)
Monocytes Absolute: 0.7 10*3/uL (ref 0.1–1.0)
Monocytes Relative: 7 % (ref 3–12)
Neutro Abs: 6.4 10*3/uL (ref 1.7–7.7)

## 2011-02-05 LAB — CBC
Hemoglobin: 11 g/dL — ABNORMAL LOW (ref 12.0–15.0)
MCHC: 33 g/dL (ref 30.0–36.0)
RDW: 13.4 % (ref 11.5–15.5)
WBC: 10.9 10*3/uL — ABNORMAL HIGH (ref 4.0–10.5)

## 2011-02-05 LAB — BASIC METABOLIC PANEL
GFR calc Af Amer: 45 mL/min — ABNORMAL LOW (ref >60)
GFR calc non Af Amer: 37 mL/min — ABNORMAL LOW (ref >60)
Potassium: 5.1 mEq/L (ref 3.5–5.1)
Sodium: 129 mEq/L — ABNORMAL LOW (ref 135–145)

## 2011-02-05 LAB — GLUCOSE, CAPILLARY: Glucose-Capillary: 67 mg/dL — ABNORMAL LOW (ref 70–99)

## 2011-02-05 LAB — URINE CULTURE: Culture  Setup Time: 201208302017

## 2011-02-06 LAB — CBC
Hemoglobin: 11.2 g/dL — ABNORMAL LOW (ref 12.0–15.0)
MCH: 28.8 pg (ref 26.0–34.0)
MCV: 89.7 fL (ref 78.0–100.0)
RBC: 3.89 MIL/uL (ref 3.87–5.11)

## 2011-02-06 LAB — BASIC METABOLIC PANEL
CO2: 27 mEq/L (ref 19–32)
Calcium: 9.3 mg/dL (ref 8.4–10.5)
Glucose, Bld: 335 mg/dL — ABNORMAL HIGH (ref 70–99)
Sodium: 134 mEq/L — ABNORMAL LOW (ref 135–145)

## 2011-02-06 LAB — GLUCOSE, CAPILLARY: Glucose-Capillary: 276 mg/dL — ABNORMAL HIGH (ref 70–99)

## 2011-02-07 LAB — CBC
Hemoglobin: 11.4 g/dL — ABNORMAL LOW (ref 12.0–15.0)
MCH: 29 pg (ref 26.0–34.0)
RBC: 3.93 MIL/uL (ref 3.87–5.11)

## 2011-02-07 LAB — BASIC METABOLIC PANEL
CO2: 28 mEq/L (ref 19–32)
Chloride: 99 mEq/L (ref 96–112)
Glucose, Bld: 266 mg/dL — ABNORMAL HIGH (ref 70–99)
Potassium: 4.2 mEq/L (ref 3.5–5.1)
Sodium: 136 mEq/L (ref 135–145)

## 2011-02-10 LAB — CULTURE, BLOOD (ROUTINE X 2)
Culture  Setup Time: 201208301229
Culture: NO GROWTH

## 2011-02-10 NOTE — Discharge Summary (Signed)
Jasmine [name], Jasmine [name]               ACCOUNT NO.:  [id number]  MEDICAL RECORD NO.:  [id number]  LOCATION:  [id number]                         FACILITY:  [facility]  PHYSICIAN:  Sharlet Salina, M.D.   DATE OF BIRTH:  [birth year]  DATE OF ADMISSION:  02/03/2011 DATE OF DISCHARGE:  02/07/2011                              DISCHARGE SUMMARY   DISCHARGE DIAGNOSES: 1. Recurrent pyelonephritis. 2. Type 1 diabetes. 3. Hypertension. 4. Hyperlipidemia. 5. Hypothyroidism. 6. Bipolar disorder. 7. Chronic kidney disease. 8. Obstructive sleep apnea. 9. Right ovarian cyst.  DISCHARGE MEDICATIONS: 1. Ciprofloxacin 500 mg twice daily. 2. Oxycodone 5 mg every 4 hours as needed. 3. Coreg 25 mg b.i.d. 4. Fish oil every morning. 5. Folic acid 1 mg every morning. 6. Geodon 60 mg daily. 7. Insulin pump. 8. Levothroid 88 mcg every morning. 9. Lexapro 10 mg every morning. 10.Lisinopril 20 mg twice daily. 11.Lamotrigine 1500 mg every evening. 12.Mirtazapine 45 mg every morning. 13.Pravastatin 10 mg at bedtime. 14.Vitamin D3 over-the-counter every morning. 15.Xanax 1 mg half tablet to one tablet three times daily as needed.  FOLLOWUP APPOINTMENTS:  The patient to follow up with [facility] within a week.  The patient may also want to follow up with endocrinologist regarding her diabetes if her sugars are elevated.  PROCEDURES:  CT scan of the abdomen and pelvis shows mild right-sided pelvocaliectasis and bilateral perinephric fat stranding, which tracks inferiorly along prominent bilateral ureters.  The appearance remains concerning for infection.  The renal involvement is similar to prior. Ureteral involvement has slightly increased.  Recommend urinalysis correlation or urinal tract calculi identified.  Partially images pulmonary opacity within the right lower lobe atelectasis versus infiltrate.  CONSULTANTS: 1. Infectious Disease, Alcide Evener, MD 2. Urology, Bernestine Amass,  MD  HISTORY OF PRESENT ILLNESS:  The patient is a 40 year old white female with past medical history significant for multiple medical problems as listed above.  The patient was admitted to the hospital January 27, 2011, through January 29, 2011, for acute pyelo.  She was discharged home, felt a little better, then started to feel bad, which was nausea, back pain, and pelvic pain, so she came back to the emergency room.  In the emergency room, she had a CAT scan done that showed continued pyelo that has worsened.  We were asked to admit the patient.  PAST MEDICAL HISTORY:  Per admission H and P.  FAMILY HISTORY:  Per admission H and P.  SOCIAL HISTORY:  Per admission H and P.  MEDICATIONS:  Per admission H and P.  ALLERGIES:  Per admission H and P.  REVIEW OF SYSTEMS:  Per admission H and P.  PHYSICAL EXAMINATION:  VITAL SIGNS:  At the time of discharge, temperature 98.1, pulse 68, respirations 20, blood pressure 137/74, pulse ox 92% on room air. GENERAL:  The patient is lying in bed, well-nourished white female. HEENT:  Head normocephalic, atraumatic.  Pupils reactive to light. Throat without erythema. CARDIOVASCULAR:  Regular rate and rhythm. LUNGS:  Clear bilaterally. ABDOMEN:  Soft with no tenderness, positive bowel sounds. EXTREMITIES:  Without edema.  She does have still some pain in the lower back area.  HOSPITAL COURSE:  1. Acute pyelo.  The patient was admitted to the hospital, placed on     IV Rocephin.  Because this is her second episode and it does show     still continued stranding around the kidneys, ID was consulted.     She had bacteremia at the last admission.  ID recommended     continuing the Rocephin initially and then changed her to Cipro.     Dr. Megan Salon saw her yesterday and recommended discharging her on 4     more days of Cipro.  She will be discharged with Cipro and she will     follow up with her PCP to get another UA to evaluate for clearing     of  urinary tract infection. 2. Diabetes.  She does have type 1 diabetes that is poorly controlled.     She does have her insulin pump.  She could follow up with her     primary care doctor and also follow with Endocrinology to help     manage her diabetes. 3. Dyslipidemia.  She was continued on her home medications. 4. Hypertension.  She was continued on her home medication. 5. Chronic renal insufficiency.  Creatinine has been stable.  DISCHARGE LABS:  Sodium 136, potassium 4.2, chloride 99, CO2 of 28, glucose was 60, BUN 20, creatinine 1.40, calcium 9.5.  WBC 7.4, hemoglobin 11.4, MCV 89.6, platelet of 444.  Urine culture, insignificant growth.  Acute hepatitis panel negative.  Blood cultures negative x2.  Time spent with the patient and doing this discharge is approximately 35 minutes.     Sharlet Salina, M.D.     NJ/MEDQ  D:  02/07/2011  T:  02/07/2011  Job:  [id number]  cc:   [facility]  Electronically Signed by Sharlet Salina M.D. on 02/10/2011 10:30:46 PM

## 2011-02-10 NOTE — H&P (Signed)
Jasmine [name], Jasmine [name]               ACCOUNT NO.:  [id number]  MEDICAL RECORD NO.:  [id number]  LOCATION:  [id number]                         FACILITY:  [facility]  PHYSICIAN:  Sharlet Salina, M.D.   DATE OF BIRTH:  Jul 11, 1970  DATE OF ADMISSION:  02/03/2011 DATE OF DISCHARGE:                             HISTORY & PHYSICAL   CHIEF COMPLAINT:  Back pain and pelvic pain.  HISTORY OF PRESENT ILLNESS:  The patient is a 40 year old white female with past medical history significant for type 1 diabetes, hypertension, hypothyroidism, and bipolar disorder.  The patient was recently admitted to the hospital on August 22 to the 24th for acute pyelonephritis and UTI.  She was discharged and 24th with p.o. antibiotics.  The patient stated that since she got home she continues to feel nauseous with pain in her back and in her pelvic area that she has been taking Tylenol, but no improvement..  So she came back to the emergency room.  She continues to have nausea but no fevers, no chills, no known dysuria.  She has been taking the antibiotic she was prescribed prior to discharge.  She is now off the Lantus and NovoLog and back on her insulin pump, but her blood sugars have been running high.  PAST MEDICAL HISTORY: 1. Significant for recent admission, discharged on August 24 for acute     pyelo UTIs. 2. Type 1 diabetes. 3. Hypertension. 4. Hyperlipidemia. 5. Hypothyroidism. 6. Bipolar disorder. 7. Chronic kidney disease stage. 8. Obstructive sleep apnea.  Not using CPAP. 9. Right ovarian cyst.  FAMILY HISTORY:  Mother died at age 6 from sepsis.  Father is alive and suffers from hypertension, CHF, diabetes, and high cholesterol.  One brother is healthy.  SOCIAL HISTORY:  The patient lives with her father in [place].  She is separated from her husband.  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  She smokes half a pack of cigarettes a day.  No alcohol use.  MEDICATIONS:  As  same as prior to discharge except for Lantus and no NovoLog. 1. Cefuroxime 500 mg twice daily. 2. Coreg 25 mg twice daily. 3. Fish oil daily. 4. Folic acid 1 mg daily. 5. Geodon 60 mg daily. 6. Insulin pump. 7. Lexapro 10 mg daily. 8. Lamotrigine 100 mg q.p.m. 9. Levothyroxine 88 mcg daily. 10.Lisinopril 20 mg twice daily. 11.Mirtazapine 45 mg q.a.m. 12.Pravastatin 10 mg at bedtime. 13.Vitamin D3 over-the-counter daily. 14.Xanax 1 mg t.i.d. p.r.n.  ALLERGIES:  LEVAQUIN.  REVIEW OF SYSTEMS:  Negative otherwise stated in the HPI.  PHYSICAL EXAMINATION:  VITAL SIGNS:  Temperature 98.7, pulse 76, respirations 20, blood pressure 131/78, and pulse ox 98% on room air. GENERAL:  The patient is sitting in bed, well-nourished white female. HEENT:  Normocephalic and atraumatic.  Pupils reactive to light without erythema. CARDIOVASCULAR:  Regular rate and rhythm. LUNGS:  Clear bilaterally. ABDOMEN:  Positive bowel sounds. EXTREMITIES:  No edema.  LABORATORY DATA:  CT scan of the abdomen and pelvis shows mild right side pelvicaliectasis and bilateral perinephric fat stranding which tracks inferiorly along prominent bilateral ureters, appearance is concerning for infection.  WBC 20.6, hemoglobin 11.9, MCV 88.9,  platelet of 582.  Urine pregnancy negative.  Sodium 132, potassium 4.3, chloride 94, CO2 of 29, glucose 37,  BUN 25, creatinine 2.0.  LFTs are within normal limits.  Urine micro 11-20 wbc's.  UA greater than 300 proteins, small leukocyte.  ASSESSMENT AND PLAN: 1. Acute pyelonephritis, persistent. 2. Type 1 diabetes. 3. Hypertension. 4. Hyperlipidemia. 5. Hypothyroidism. 6. Bipolar. 7. Chronic kidney disease.  We will admit the patient to the hospital.  We will put her on IV Rocephin because her white count is back up and it showed stranding around bilateral kidney as well as ID's put with this management, also got repeat blood cultures in the ED.  For hypertension, we  will hold her lisinopril because her creatinine is slightly elevated.  We will give her mild hydration.  We will continue her cholesterol and thyroid medication and bipolar medications.  Monitor kidney function.  For her diabetes since it is poorly controlled on insulin pump, we will hold the insulin pump for now and put her back on Lantus and NovoLog.  Time spent with the patient and doing this admission is approximately 45 minutes.     Sharlet Salina, M.D.     NJ/MEDQ  D:  02/04/2011  T:  02/04/2011  Job:  [id number]  cc:   Bing Matter, PA Dr. Posey Pronto  Electronically Signed by Sharlet Salina M.D. on 02/10/2011 10:30:42 PM

## 2011-02-17 ENCOUNTER — Encounter (HOSPITAL_COMMUNITY): Payer: Self-pay | Admitting: *Deleted

## 2011-02-17 DIAGNOSIS — I10 Essential (primary) hypertension: Secondary | ICD-10-CM | POA: Insufficient documentation

## 2011-02-17 DIAGNOSIS — Z794 Long term (current) use of insulin: Secondary | ICD-10-CM | POA: Insufficient documentation

## 2011-02-17 DIAGNOSIS — Z79899 Other long term (current) drug therapy: Secondary | ICD-10-CM | POA: Insufficient documentation

## 2011-02-17 DIAGNOSIS — E1169 Type 2 diabetes mellitus with other specified complication: Secondary | ICD-10-CM | POA: Insufficient documentation

## 2011-02-17 DIAGNOSIS — Z7982 Long term (current) use of aspirin: Secondary | ICD-10-CM | POA: Insufficient documentation

## 2011-02-17 DIAGNOSIS — E079 Disorder of thyroid, unspecified: Secondary | ICD-10-CM | POA: Insufficient documentation

## 2011-02-17 DIAGNOSIS — F319 Bipolar disorder, unspecified: Secondary | ICD-10-CM | POA: Insufficient documentation

## 2011-02-17 NOTE — ED Notes (Signed)
Pt brought in by [facility] for c/o low CBG and unresponsiveness; pt was found by her father to be unresponsive and CBG reading of "low"; pt was given an amp of D50 by ems and CBG was brought up to 188; pt still on responsive to verbal stimuli; pt states she took her nighttime med of xanax 4mg  PO

## 2011-02-18 ENCOUNTER — Emergency Department (HOSPITAL_COMMUNITY)
Admission: EM | Admit: 2011-02-18 | Discharge: 2011-02-18 | Disposition: A | Discharge status: Home / Self Care | Payer: Medicare Other | Attending: Emergency Medicine | Admitting: Emergency Medicine

## 2011-02-18 DIAGNOSIS — E162 Hypoglycemia, unspecified: Secondary | ICD-10-CM

## 2011-02-18 LAB — GLUCOSE, CAPILLARY
Glucose-Capillary: 177 mg/dL — ABNORMAL HIGH (ref 70–99)
Glucose-Capillary: 269 mg/dL — ABNORMAL HIGH (ref 70–99)
Glucose-Capillary: 297 mg/dL — ABNORMAL HIGH (ref 70–99)
Glucose-Capillary: 454 mg/dL — ABNORMAL HIGH (ref 70–99)

## 2011-02-18 NOTE — ED Provider Notes (Signed)
History     CSN: [id number] Arrival date & time: 02/18/2011 12:09 AM  Chief Complaint  Patient presents with  . Hypoglycemia   HPI Level V caveat: Altered mental status. This is a 40 year old white female with a history of diabetes. Her father went to check on her just prior to arrival and found her unresponsive in the kitchen. He aroused  The features of ice cream and cookies. EMS was summoned and they found her CBG to read low. Administered a nap of D50 IV and brought her to the emergency department. They report followup CBG of 188. On arrival to the emergency department her CBG was 144. Nursing staff reports the patient has been somnolent but arousable by ammonia or sternal rub and able to answer questions. Her father states that this is more prolonged than her usual hypoglycemic episodes. He states she did take her nighttime dose of Xanax 1mg . There is no known precipitating event for this hypoglycemic episode the  Past Medical History  Diagnosis Date  . Hypertension   . Diabetes mellitus   . Thyroid disease   . Bipolar disorder   . Renal disorder   . Renal insufficiency     Past Surgical History  Procedure Date  . Ovary removed   . Nasal fracture surgery     History reviewed. No pertinent family history.  History  Substance Use Topics  . Smoking status: Current Everyday Smoker  . Smokeless tobacco: Not on file  . Alcohol Use: No    OB History    Grav Para Term Preterm Abortions TAB SAB Ect Mult Living                  Review of Systems  Unable to perform ROS   Physical Exam  BP 94/64  Pulse 66  Temp(Src) 97.6 F (36.4 C) (Oral)  Resp 20  Ht 5\' 9"  (1.753 m)  Wt 260 lb (117.935 kg)  BMI 38.40 kg/m2  SpO2 96%  Physical Exam General: Well-developed, well-nourished, obese female in no acute distress; appearance consistent with age of record HENT: normocephalic, atraumatic Eyes: pupils equal round and reactive to light Neck: supple Heart: regular rate  and rhythm Lungs: clear to auscultation bilaterally Abdomen: soft; obese Extremities: No deformity;pulses normal Neurologic: Somnolent; will open eyes to sternal rub; will nod to questions but then falls back to sleep. Skin: Warm and dry   ED Course  Procedures  MDM 1:40 AM Patient now more alert will arouse and is conversant. She states she feels better. Her father neurologist at her mental status is improved.  2:37 AM Patient's insulin pump was restarted in light of blood sugar near 400. Patient continues to improve.      Wynetta Fines, MD 02/18/11 [phone]

## 2011-02-18 NOTE — ED Notes (Signed)
Had to do a over ride CBG on patient before chart was available results of CBG 142

## 2011-02-18 NOTE — ED Notes (Signed)
Pt cont to sleep , resp even

## 2011-02-18 NOTE — ED Notes (Signed)
Pt cont. To be more alert and responds to all ?'s

## 2011-02-18 NOTE — ED Notes (Signed)
md notified of pts increased bs, insulin pump re-started by pt. Per md request

## 2011-02-18 NOTE — ED Notes (Signed)
Ate complete sandwich and pudding cup. Drank all of diet ginger ale, stated she was feeling much better and ready to go home,

## 2011-02-18 NOTE — ED Notes (Signed)
Pt brought to er to eval low bs, pt arrived by ems, they reported finding pt slumped at common room at apartment complex, checked bs and machine read low, now at 144 upon arrival, pt groggy but will answer ?'s when stimulated, father is at bedside and stated she usually gets this way when her bs gets low, pt stated she tool normal hs meds, no more than usual/ (takes 1mg  xanax at hs)

## 2011-02-24 NOTE — Consult Note (Signed)
NAMEALZIE, MATHENY NO.:  [id number]  MEDICAL RECORD NO.:  [id number]  LOCATION:                                 FACILITY:  PHYSICIAN:  Bernestine Amass, MD    DATE OF BIRTH:  September 18, 1970  DATE OF CONSULTATION: DATE OF DISCHARGE:                                CONSULTATION   REASON FOR CONSULTATION:  Questionable recurrent pyelonephritis.  HISTORY OF PRESENT ILLNESS:  Ms. Geoffry Paradise is 40 years of age.  She does have a number of medical comorbidities including type 1 diabetes mellitus, hypertension, bipolar disorder.  The patient had been admitted earlier this month for what was felt to be acute pyelonephritis.  At that time, the patient did have a positive urine culture for Klebsiella pneumonia and one out of two positive blood cultures.  A CT was done with contrast which did not show any evidence of significant lobar nephronia.  No evidence of renal abscess.  She did have some very nonspecific perinephric streaking within the fat planes.  There did not appear to be any hydronephrosis, stone, or other complicating structural abnormalities.  She was discharged on antibiotics which she states that she had been taking.  The patient was subsequently readmitted.  At this time, she was complaining primarily of some pelvic and lower abdominal discomfort and low back pain.  Urinalysis did show small degree of pyuria with few bacteriuria, but there also squamous cells present, suggesting possible contamination.  Urine cultures shown insignificant growth.  She has been afebrile with a normal white blood cell count.  She is feeling better this morning with just some minimal discomfort in her lower pelvic region.  The patient denies any prior history of pyelonephritis.  She did not have childhood urinary tract infections or other significant problems. She is really having no voiding complaints at this time.  PAST MEDICAL HISTORY:  Again notable for type 1 diabetes  mellitus, hypertension, hyperlipidemia, bipolar disorder.  CURRENT MEDICATIONS:  Carvedilol, Lexapro, insulin, fiber replacement, Percocet, Zocor, ceftriaxone.  ALLERGIES:  The patient has a questionable allergy to [facility].  SOCIAL HISTORY:  Notable for minimal tobacco use history.  REVIEW OF SYSTEMS:  Primarily positive for above-mentioned issues.  She is currently having some mild lower back pain and lower abdominal discomfort.  Really no nausea.  No dysuria, hematuria, gross hematuria. No flank pain.  PHYSICAL EXAMINATION:  GENERAL:  She is currently afebrile.  She was resting quietly, but otherwise alert and oriented.  She was nontoxic in appearance and appear to be in no acute distress. VITAL SIGNS:  She has current blood pressure of 149/82 with a pulse of 77. NECK:  Shows no obvious gross masses or JVD. HEART:  Regular rate and rhythm. LUNGS:  Normal respiratory effort. ABDOMEN:  Slightly distended/protuberant.  It is nontender.  There is no CVA tenderness.  She does have some very minimal low back tenderness. EXTREMITIES:  Show no obvious edema. NEUROLOGY:  Grossly nonfocal.  DATA:  White blood cell count is currently 8400.  Her creatinine is 1.56.  Urine culture showed insignificant growth.  Blood cultures are negative to date.  ASSESSMENT:  Readmission with nonspecific pelvic and lower  abdominal discomfort.  The patient had a repeat noncontrast CT, which did show some very nonspecific bilateral perinephric fat stranding.  No obvious evidence of renal abscess or other perinephric process.  Possibly, mild pelvocaliectasis on the right, but certainly no gross evidence of hydronephrosis.  No evidence of renal calculi.  The patient did have an elevated white blood cell count on admission but that has normalized. She has remained afebrile.  Again, urinalysis was relatively unremarkable and given the squamous cells present may have really represented more vaginal  contamination.  I suspect a cath urine at the time of admission, may have been quite clear.  At this point, there are no definitive structural abnormalities.  She may have had some mild lingering pyelonephritis, but there is certainly no obvious evidence of ongoing significant pyelonephritis at this time.  She will need to finish a more extended course of antibiotics, pending repeat culture data.  If all cultures currently are negative, then she probably should continue on what she had been on previously.  If her clinical situation deteriorates with increasing pain, increased white blood cell count or fever, and a repeat contrast CT will need to be done to look further at the kidneys to see if she is developing areas of lobar nephronia, renal abscess, etc.  I would also recommend delayed images to see if that mild right-sided pelvocaliectasis does truly represent any degree of obstruction or not.  Certainly, at this point, there is no compelling or necessary need for additional imaging or any urologic intervention.     Bernestine Amass, MD     DSG/MEDQ  D:  02/06/2011  T:  02/06/2011  Job:  [id number]  Electronically Signed by Rana Snare M.D. on 02/24/2011 09:32:12 AM

## 2011-02-28 NOTE — Consult Note (Signed)
Jasmine [name], [name] NO.:  [id number]  MEDICAL RECORD NO.:  Jasmine [name]  LOCATION:  6705                         FACILITY:  [facility]  PHYSICIAN:  Alcide Evener, MD  DATE OF BIRTH:  Apr 29, 1971  DATE OF CONSULTATION:  02/04/2011 DATE OF DISCHARGE:                                CONSULTATION   REQUESTING PHYSICIAN:  Dr. Jacki Cones with [facility].  REASON FOR INFECTIOUS DISEASE CONSULTATION:  This patient with chronic back pain, pelvic pain who had recent complicated pyelonephritis with bacteremia with Klebsiella pneumonia.  HISTORY OF PRESENT ILLNESS:  Jasmine [name] is a 40 year old Caucasian female with diabetes mellitus, hypertension, hypothyroidism, and bipolar disorder admitted to the hospital on August 22 for pyelonephritis, urinary tract infection with Klebsiella pneumoniae, was also found in blood.  Of note, she had earlier felt ill in early August and had been having nausea, vomiting, and diarrhea, was thought to have a viral gastroenteritis.  Apparently had a CT scan done on August 19.  This showed significant edema in the perinephric fat spaces along Gerota fascia tracking inferior minimally, prominent ureters.  There are no stones or hydronephrosis seen and the findings were specific but it raised the question of urinary tract infection and inflammation.  In any case, she did not receive therapy for urinary tract infection at that time.  She had was admitted on August 22, at which point in time, she was found to have pyuria and her urine culture grew Klebsiella pneumoniae and 1/2 of blood cultures also grew Klebsiella pneumoniae. She was treated as an inpatient with intravenous antibiotics and ultimately discharged home on cefuroxime.  The patient now returned with worsening lower back pain, pelvic pain, and nausea without emesis.  She had not had fevers or chills.  She states she has been taking antibiotics as prescribed.  PAST MEDICAL  HISTORY: 1. Recent pyelonephritis with bacteremia. 2. Diabetes mellitus. 3. Hypertension. 4. Hyperlipidemia. 5. Hypothyroidism. 6. Bipolar disorder. 7. Chronic kidney disease. 8. Obstructive sleep apnea. 9. Ovarian cyst.  FAMILY HISTORY:  Mother died at 62 abscess.  Father is alive and suffers from hypertension, heart failure, diabetes, and high cholesterol.  SOCIAL HISTORY:  The patient lives with her father in [place]. Separated from husband.  She was a Psychologist, sport and exercise prior, but now on disability.  Smoked half pack of cigarettes per day.  ALLERGIES:  She has an allergy to [facility] which causes body aches but she actually had 2 courses of ciprofloxacin without problems since then.  REVIEW OF SYSTEMS:  As described in history of present illness, otherwise 12-point review of systems is negative.  CURRENT MEDICATIONS:  Carvedilol, ceftriaxone, Lexapro, folic acid, insulin, mirtazapine, levothyroxine, lamotrigine, Percocet, omega-3 fatty acids, Zocor, and Geodon.  PHYSICAL EXAMINATION:  VITAL SIGNS:  Temperature maximum since arrival 98.7, blood pressure 139/80, pulse 88, respirations 20, and pulse oximetry 97% on room air. GENERAL:  A pleasant but dysphoric lady in no acute distress. HEENT:  Normocephalic and atraumatic.  Extraocular movements intact. Sclerae anicteric.  Oropharynx clear. NECK:  Supple. CARDIOVASCULAR:  Regular rate and rhythm.  No murmurs, gallops, or rubs heard. LUNGS:  Relatively clear to auscultation. ABDOMEN:  Soft, nondistended, and nontender.  MUSCULOSKELETAL:  Examination of her back reveals some tenderness at the CVA bilaterally. NEUROLOGIC:  Nonfocal.  LABORATORY DATA:  CBC with differential; white count 20,600, hemoglobin 11.9, and platelets 582 with an ANC of 14.5.  Comprehensive metabolic panel:  Sodium Q000111Q, potassium 4.3, chloride 95, bicarb 29, BUN and creatinine 25 and 2, and glucose 307.  Bilirubin 0.1, alk phos 85, AST and ALT 14 and  13.  Urinalysis showed 11-20 white blood cells with some hyaline casts, 0-2 red blood cells, greater than 300 protein, pH 1.018, small amount of leukocytes, and 100 of glucose.  MICROBIOLOGICAL DATA:  Blood culture from August 22nd no growth.  Second blood culture on August 22nd Klebsiella pneumoniae, resistant to ampicillin, otherwise sensitive to Unasyn, cefazolin, Ceftin, ceftazidime, ceftriaxone, ciprofloxacin, gentamicin, imipenem, and cefoxitin.  Urine culture; Klebsiella pneumoniae, also identical sensitivity patterns to aforementioned Klebsiella pneumoniae.  CT scan of the abdomen and pelvis without contrast on August 30, this shows mild right-sided pelvicaliectasis and bilateral perinephric fat stranding tracking inferiorly and prominent bilateral ureters.  The appearance again remains concerning for infection.  The renal involvement is similar to prior study.  Ureteral involvement has slightly increased.  No stones identified.  There is a pulmonary opacity in the right lower lobe which is atelectasis versus infiltrate.  IMPRESSION AND RECOMMENDATIONS:  A 40 year old lady with recent complicated urinary tract infection with Klebsiella pneumoniae bacteremia, now readmitted for similar symptoms, worrisome for urinary tract infection despite receiving antibiotics. Abdominal pain and pelvic pain with elevated white blood cell count: Certainly strange story that the patient would have persistence or recurrence of her Klebsiella pneumonia in blood or urine.  My personal preference would have been to send her home with a highly bioavailable drug such as ciprofloxacin or an IV antibiotic to complete 2 weeks of total systemic therapy.  I also personally would prefer to repeat blood cultures to make sure they are cleared.  All that being said, I am still surprised that she would come back with persistent bacteremia or persistent infection in her urine.  Certainly, we are still in the  process of collecting data.  I will follow up her urine cultures of blood cultures.  I think ceftriaxone is reasonable for now.  Thank you for Infectious Disease consultation.     Alcide Evener, MD     CV/MEDQ  D:  02/04/2011  T:  02/04/2011  Job:  [id number]  Electronically Signed by Rhina Brackett DAM MD on 02/28/2011 10:03:01 PM

## 2011-04-07 ENCOUNTER — Emergency Department (HOSPITAL_COMMUNITY)
Admission: EM | Admit: 2011-04-07 | Discharge: 2011-04-08 | Disposition: A | Discharge status: Home / Self Care | Payer: Medicare Other | Attending: Emergency Medicine | Admitting: Emergency Medicine

## 2011-04-07 ENCOUNTER — Encounter (HOSPITAL_COMMUNITY): Payer: Self-pay

## 2011-04-07 ENCOUNTER — Emergency Department (HOSPITAL_COMMUNITY): Payer: Medicare Other

## 2011-04-07 DIAGNOSIS — R739 Hyperglycemia, unspecified: Secondary | ICD-10-CM

## 2011-04-07 DIAGNOSIS — R109 Unspecified abdominal pain: Secondary | ICD-10-CM | POA: Insufficient documentation

## 2011-04-07 DIAGNOSIS — Z794 Long term (current) use of insulin: Secondary | ICD-10-CM | POA: Insufficient documentation

## 2011-04-07 DIAGNOSIS — E119 Type 2 diabetes mellitus without complications: Secondary | ICD-10-CM | POA: Insufficient documentation

## 2011-04-07 DIAGNOSIS — F319 Bipolar disorder, unspecified: Secondary | ICD-10-CM | POA: Insufficient documentation

## 2011-04-07 DIAGNOSIS — F17200 Nicotine dependence, unspecified, uncomplicated: Secondary | ICD-10-CM | POA: Insufficient documentation

## 2011-04-07 DIAGNOSIS — R10819 Abdominal tenderness, unspecified site: Secondary | ICD-10-CM | POA: Insufficient documentation

## 2011-04-07 DIAGNOSIS — I10 Essential (primary) hypertension: Secondary | ICD-10-CM | POA: Insufficient documentation

## 2011-04-07 DIAGNOSIS — E079 Disorder of thyroid, unspecified: Secondary | ICD-10-CM | POA: Insufficient documentation

## 2011-04-07 DIAGNOSIS — Z7982 Long term (current) use of aspirin: Secondary | ICD-10-CM | POA: Insufficient documentation

## 2011-04-07 LAB — DIFFERENTIAL
Basophils Absolute: 0.1 10*3/uL (ref 0.0–0.1)
Eosinophils Absolute: 0.3 10*3/uL (ref 0.0–0.7)
Eosinophils Relative: 3 % (ref 0–5)

## 2011-04-07 LAB — CBC
MCH: 30.5 pg (ref 26.0–34.0)
MCV: 90.3 fL (ref 78.0–100.0)
Platelets: 272 10*3/uL (ref 150–400)
RDW: 13.2 % (ref 11.5–15.5)

## 2011-04-07 LAB — COMPREHENSIVE METABOLIC PANEL
ALT: 8 U/L (ref 0–35)
AST: 9 U/L (ref 0–37)
Calcium: 9.5 mg/dL (ref 8.4–10.5)
GFR calc Af Amer: 34 mL/min — ABNORMAL LOW (ref >90)
Sodium: 126 mEq/L — ABNORMAL LOW (ref 135–145)
Total Protein: 6.8 g/dL (ref 6.0–8.3)

## 2011-04-07 LAB — URINE MICROSCOPIC-ADD ON

## 2011-04-07 LAB — GLUCOSE, CAPILLARY
Glucose-Capillary: 526 mg/dL — ABNORMAL HIGH (ref 70–99)
Glucose-Capillary: 68 mg/dL — ABNORMAL LOW (ref 70–99)

## 2011-04-07 LAB — URINALYSIS, ROUTINE W REFLEX MICROSCOPIC
Glucose, UA: >1000 mg/dL — AB
Leukocytes, UA: NEGATIVE
Specific Gravity, Urine: <1.005 — ABNORMAL LOW (ref 1.005–1.030)
Urobilinogen, UA: 0.2 mg/dL (ref 0.0–1.0)

## 2011-04-07 MED ORDER — INSULIN REGULAR HUMAN 100 UNIT/ML IJ SOLN
10.0000 [IU] | Freq: Once | INTRAMUSCULAR | Status: AC
  Administered 2011-04-07: 10 [IU] via SUBCUTANEOUS
  Filled 2011-04-07: qty 0.1

## 2011-04-07 MED ORDER — PANTOPRAZOLE SODIUM 40 MG IV SOLR
40.0000 mg | Freq: Once | INTRAVENOUS | Status: AC
  Administered 2011-04-07: 40 mg via INTRAVENOUS
  Filled 2011-04-07: qty 40

## 2011-04-07 MED ORDER — SODIUM CHLORIDE 0.9 % IV BOLUS (SEPSIS)
1000.0000 mL | Freq: Once | INTRAVENOUS | Status: AC
  Administered 2011-04-07: 1000 mL via INTRAVENOUS

## 2011-04-07 MED ORDER — ONDANSETRON HCL 4 MG/2ML IJ SOLN
4.0000 mg | Freq: Once | INTRAMUSCULAR | Status: AC
  Administered 2011-04-07: 4 mg via INTRAVENOUS
  Filled 2011-04-07: qty 2

## 2011-04-07 NOTE — ED Notes (Signed)
C/o blood sugar being over 500--States she has been treated for pyelonephritis for the past three mos. And does not think it has ever entirely cleared--Tender along low mid abdomen and right flank--Reports increased urgency and burning with urination.

## 2011-04-07 NOTE — ED Provider Notes (Signed)
Scribed for Jasmine Christen, MD, the patient was seen in room APA04/APA04. This chart was scribed by [employer]. The patient's care started at 17:41  CSN: [id number] Arrival date & time: 04/07/2011  5:39 PM   First MD Initiated Contact with Patient 04/07/11 1741      Chief Complaint  Patient presents with  . Hyperglycemia   HPI Jasmine [name] is a 40 y.o. female who presents to the Emergency Department complaining of Hyperglycemia. Reports having her blood sugar in the 500s all through today. States that she has given herself 30 of novo log. States that her sugar is in the 200s at baseline. Reports a history of kidney infections for 2.5 months. States that infection is in both kidneys.   Past Medical History  Diagnosis Date  . Hypertension   . Diabetes mellitus   . Thyroid disease   . Bipolar disorder   . Renal disorder   . Renal insufficiency     Past Surgical History  Procedure Date  . Ovary removed   . Nasal fracture surgery     No family history on file.  History  Substance Use Topics  . Smoking status: Current Everyday Smoker  . Smokeless tobacco: Not on file  . Alcohol Use: No    OB History    Grav Para Term Preterm Abortions TAB SAB Ect Mult Living                  Review of Systems  Genitourinary: Positive for flank pain (bilaterally).  All other systems reviewed and are negative.    Allergies  Levaquin  Home Medications   Current Outpatient Rx  Name Route Sig Dispense Refill  . ALPRAZOLAM 1 MG PO TABS Oral Take 1 mg by mouth. Prn for anxiety     . AMLODIPINE BESYLATE 5 MG PO TABS Oral Take 5 mg by mouth 2 (two) times daily.      . ASPIRIN 81 MG PO TABS Oral Take 81 mg by mouth daily.      . ASPIRIN EC 81 MG PO TBEC Oral Take 81 mg by mouth daily.      . BUPROPION HCL 75 MG PO TABS Oral Take 75 mg by mouth. q hs     . CARVEDILOL 25 MG PO TABS Oral Take 25 mg by mouth 2 (two) times daily with a meal.      . ESCITALOPRAM OXALATE 10 MG PO TABS  Oral Take 10 mg by mouth daily.      . FUROSEMIDE 20 MG PO TABS Oral Take 20 mg by mouth 2 (two) times daily. Prn edema     . HYDROCHLOROTHIAZIDE 25 MG PO TABS Oral Take 25 mg by mouth daily.      . INSULIN GLARGINE 100 UNIT/ML Gary SOLN Subcutaneous Inject 60 Units into the skin at bedtime.     . INSULIN REGULAR HUMAN 100 UNIT/ML IJ SOLN Subcutaneous Inject 20 Units into the skin 3 (three) times daily before meals. Sliding scale does not always use 20 nits 3 times a day    . LAMOTRIGINE 200 MG PO TABS Oral Take 200 mg by mouth at bedtime.     Marland Kitchen LEVOTHYROXINE SODIUM 88 MCG PO TABS Oral Take 88 mcg by mouth daily.      Marland Kitchen LISINOPRIL 20 MG PO TABS Oral Take 20 mg by mouth 2 (two) times daily.      Marland Kitchen MIRTAZAPINE 30 MG PO TABS Oral Take 30 mg by mouth at bedtime.      Marland Kitchen  ONE-DAILY MULTI VITAMINS PO TABS Oral Take 1 tablet by mouth daily.      Marland Kitchen PRAVASTATIN SODIUM 10 MG PO TABS Oral Take 10 mg by mouth at bedtime. q hs    . RANITIDINE HCL 150 MG PO TABS Oral Take 1 tablet (150 mg total) by mouth 2 (two) times daily. 40 tablet 0  . ZIPRASIDONE HCL 60 MG PO CAPS Oral Take 60 mg by mouth at bedtime.       BP 134/75  Pulse 72  Temp 97.8 F (36.6 C)  Resp 20  Ht 5' 9.5" (1.765 m)  Wt 270 lb (122.471 kg)  BMI 39.30 kg/m2  SpO2 98%  Physical Exam  Nursing note and vitals reviewed. Constitutional: She is oriented to person, place, and time. She appears well-developed and well-nourished. No distress.       Awake, alert, nontoxic appearance.  HENT:  Head: Normocephalic and atraumatic.  Mouth/Throat: Oropharynx is clear and moist. No oropharyngeal exudate.  Eyes: Conjunctivae are normal. Right eye exhibits no discharge. Left eye exhibits no discharge.  Neck: Neck supple. No tracheal deviation present.  Cardiovascular: Normal rate.   No murmur heard. Pulmonary/Chest: Effort normal. No respiratory distress. She has no wheezes. She exhibits no tenderness.  Abdominal: Soft. Bowel sounds are normal.  There is tenderness (suprapubic area). There is no rebound.  Musculoskeletal: Normal range of motion. She exhibits tenderness (bilaterally in the flank). She exhibits no edema.       Baseline ROM, no obvious new focal weakness.  Lymphadenopathy:    She has no cervical adenopathy.  Neurological: She is alert and oriented to person, place, and time. No cranial nerve deficit.       Mental status and motor strength appears baseline for patient and situation.  Skin: Skin is warm and dry. No rash noted. No erythema.  Psychiatric: She has a normal mood and affect. Her behavior is normal.    ED Course  Procedures  DIAGNOSTIC STUDIES: Oxygen Saturation is 98% on room air, normal by my interpretation.    COORDINATION OF CARE: 17:58 - EDP examined patient at bedside. CT scan of the abdomen/pelvis, IV fluids and Labs ordered   Results for orders placed during the hospital encounter of 04/07/11  GLUCOSE, CAPILLARY      Component Value Range   Glucose-Capillary 526 (*) 70 - 99 (mg/dL)   Comment 1 Documented in Chart     Comment 2 Notify RN    CBC      Component Value Range   WBC 10.4  4.0 - 10.5 (K/uL)   RBC 4.43  3.87 - 5.11 (MIL/uL)   Hemoglobin 13.5  12.0 - 15.0 (g/dL)   HCT 40.0  36.0 - 46.0 (%)   MCV 90.3  78.0 - 100.0 (fL)   MCH 30.5  26.0 - 34.0 (pg)   MCHC 33.8  30.0 - 36.0 (g/dL)   RDW 13.2  11.5 - 15.5 (%)   Platelets 272  150 - 400 (K/uL)  DIFFERENTIAL      Component Value Range   Neutrophils Relative 60  43 - 77 (%)   Neutro Abs 6.3  1.7 - 7.7 (K/uL)   Lymphocytes Relative 29  12 - 46 (%)   Lymphs Abs 3.1  0.7 - 4.0 (K/uL)   Monocytes Relative 6  3 - 12 (%)   Monocytes Absolute 0.6  0.1 - 1.0 (K/uL)   Eosinophils Relative 3  0 - 5 (%)   Eosinophils Absolute 0.3  0.0 -  0.7 (K/uL)   Basophils Relative 1  0 - 1 (%)   Basophils Absolute 0.1  0.0 - 0.1 (K/uL)  COMPREHENSIVE METABOLIC PANEL      Component Value Range   Sodium 126 (*) 135 - 145 (mEq/L)   Potassium 5.1  3.5 -  5.1 (mEq/L)   Chloride 93 (*) 96 - 112 (mEq/L)   CO2 22  19 - 32 (mEq/L)   Glucose, Bld 526 (*) 70 - 99 (mg/dL)   BUN 34 (*) 6 - 23 (mg/dL)   Creatinine, Ser 2.06 (*) 0.50 - 1.10 (mg/dL)   Calcium 9.5  8.4 - 10.5 (mg/dL)   Total Protein 6.8  6.0 - 8.3 (g/dL)   Albumin 3.4 (*) 3.5 - 5.2 (g/dL)   AST 9  0 - 37 (U/L)   ALT 8  0 - 35 (U/L)   Alkaline Phosphatase 115  39 - 117 (U/L)   Total Bilirubin 0.2 (*) 0.3 - 1.2 (mg/dL)   GFR calc non Af Amer 29 (*) >90 (mL/min)   GFR calc Af Amer 34 (*) >90 (mL/min)  LIPASE, BLOOD      Component Value Range   Lipase 63 (*) 11 - 59 (U/L)  URINALYSIS, ROUTINE W REFLEX MICROSCOPIC      Component Value Range   Color, Urine YELLOW  YELLOW    Appearance CLEAR  CLEAR    Specific Gravity, Urine <1.005 (*) 1.005 - 1.030    pH 5.5  5.0 - 8.0    Glucose, UA >1000 (*) NEGATIVE (mg/dL)   Hgb urine dipstick TRACE (*) NEGATIVE    Bilirubin Urine NEGATIVE  NEGATIVE    Ketones, ur NEGATIVE  NEGATIVE (mg/dL)   Protein, ur TRACE (*) NEGATIVE (mg/dL)   Urobilinogen, UA 0.2  0.0 - 1.0 (mg/dL)   Nitrite NEGATIVE  NEGATIVE    Leukocytes, UA NEGATIVE  NEGATIVE   URINE MICROSCOPIC-ADD ON      Component Value Range   Squamous Epithelial / LPF RARE  RARE    RBC / HPF 0-2  <3 (RBC/hpf)   Bacteria, UA RARE  RARE   GLUCOSE, CAPILLARY      Component Value Range   Glucose-Capillary 454 (*) 70 - 99 (mg/dL)     Ct Abdomen Pelvis Wo Contrast  04/07/2011  *RADIOLOGY REPORT*  Clinical Data: Right UTI.  Diabetes.  Bilateral flank pain and hematuria.  Renal insufficiency  CT ABDOMEN AND PELVIS WITHOUT CONTRAST  Technique:  Multidetector CT imaging of the abdomen and pelvis was performed following the standard protocol without intravenous contrast.  Comparison: CT 02/03/2009  Findings: Bilateral hydronephrosis has resolved.  No renal calculi or hydronephrosis is present.  Stranding in the perinephric fat is present bilaterally shows mild improvement.  This may have been due to  obstruction or infection on the prior study.  Unenhanced images of the liver and spleen are normal.  Gallbladder is normal.  Pancreas is normal.  Negative for bowel obstruction.  Appendix is normal.  Negative for mass or adenopathy.  IUD is present in the uterus.  IMPRESSION: Improvement in bilateral hydronephrosis and perinephric stranding which may be due to infection on the prior study.  No abscess is present.  No acute abnormality.  Original Report Authenticated By: Truett Perna, M.D.    MDM  Patient has had recurrent urine infections. Glucose elevated tonight.  Will hydrate, regular insulin IV, urinalysis, repeat CT scan to assess kidney pathology   I personally performed the services described in this documentation, which  was scribed in my presence. The recorded information has been reviewed and considered.    Jasmine Christen, MD 04/07/11 [phone]

## 2011-04-07 NOTE — ED Notes (Addendum)
First liter of normal saline infusing--Family member at bedside

## 2011-04-07 NOTE — ED Notes (Signed)
Pt states her bs was 539 today. States she thinks she has a kidney infection

## 2011-04-07 NOTE — ED Notes (Signed)
ERROR--Double entry

## 2011-04-08 NOTE — ED Notes (Signed)
Pt given a meal and diet ginger ale

## 2011-04-08 NOTE — ED Notes (Signed)
Pt left the er stating no needs

## 2011-06-17 ENCOUNTER — Encounter (HOSPITAL_COMMUNITY): Payer: Self-pay | Admitting: Emergency Medicine

## 2011-06-17 ENCOUNTER — Emergency Department (HOSPITAL_COMMUNITY)
Admission: EM | Admit: 2011-06-17 | Discharge: 2011-06-17 | Disposition: A | Discharge status: Home / Self Care | Payer: Medicare Other | Attending: Emergency Medicine | Admitting: Emergency Medicine

## 2011-06-17 ENCOUNTER — Emergency Department (HOSPITAL_COMMUNITY): Payer: Medicare Other

## 2011-06-17 DIAGNOSIS — E119 Type 2 diabetes mellitus without complications: Secondary | ICD-10-CM | POA: Insufficient documentation

## 2011-06-17 DIAGNOSIS — R739 Hyperglycemia, unspecified: Secondary | ICD-10-CM

## 2011-06-17 DIAGNOSIS — Z79899 Other long term (current) drug therapy: Secondary | ICD-10-CM | POA: Insufficient documentation

## 2011-06-17 DIAGNOSIS — Z7982 Long term (current) use of aspirin: Secondary | ICD-10-CM | POA: Insufficient documentation

## 2011-06-17 DIAGNOSIS — I10 Essential (primary) hypertension: Secondary | ICD-10-CM | POA: Insufficient documentation

## 2011-06-17 DIAGNOSIS — R05 Cough: Secondary | ICD-10-CM | POA: Insufficient documentation

## 2011-06-17 DIAGNOSIS — R079 Chest pain, unspecified: Secondary | ICD-10-CM | POA: Insufficient documentation

## 2011-06-17 DIAGNOSIS — E079 Disorder of thyroid, unspecified: Secondary | ICD-10-CM | POA: Insufficient documentation

## 2011-06-17 DIAGNOSIS — N289 Disorder of kidney and ureter, unspecified: Secondary | ICD-10-CM | POA: Insufficient documentation

## 2011-06-17 DIAGNOSIS — F319 Bipolar disorder, unspecified: Secondary | ICD-10-CM | POA: Insufficient documentation

## 2011-06-17 DIAGNOSIS — Z794 Long term (current) use of insulin: Secondary | ICD-10-CM | POA: Insufficient documentation

## 2011-06-17 DIAGNOSIS — F17200 Nicotine dependence, unspecified, uncomplicated: Secondary | ICD-10-CM | POA: Insufficient documentation

## 2011-06-17 DIAGNOSIS — R059 Cough, unspecified: Secondary | ICD-10-CM | POA: Insufficient documentation

## 2011-06-17 LAB — BASIC METABOLIC PANEL
BUN: 23 mg/dL (ref 6–23)
Calcium: 9.6 mg/dL (ref 8.4–10.5)
Creatinine, Ser: 1.59 mg/dL — ABNORMAL HIGH (ref 0.50–1.10)
GFR calc non Af Amer: 40 mL/min — ABNORMAL LOW (ref >90)
Glucose, Bld: 225 mg/dL — ABNORMAL HIGH (ref 70–99)

## 2011-06-17 LAB — CBC
HCT: 38.4 % (ref 36.0–46.0)
Hemoglobin: 12.8 g/dL (ref 12.0–15.0)
MCH: 30 pg (ref 26.0–34.0)
MCHC: 33.3 g/dL (ref 30.0–36.0)

## 2011-06-17 LAB — URINALYSIS, ROUTINE W REFLEX MICROSCOPIC
Bilirubin Urine: NEGATIVE
Hgb urine dipstick: NEGATIVE
Protein, ur: 30 mg/dL — AB
Urobilinogen, UA: 0.2 mg/dL (ref 0.0–1.0)

## 2011-06-17 LAB — GLUCOSE, CAPILLARY: Glucose-Capillary: 145 mg/dL — ABNORMAL HIGH (ref 70–99)

## 2011-06-17 MED ORDER — SODIUM CHLORIDE 0.9 % IV BOLUS (SEPSIS)
1000.0000 mL | Freq: Once | INTRAVENOUS | Status: AC
  Administered 2011-06-17: 1000 mL via INTRAVENOUS

## 2011-06-17 NOTE — ED Notes (Signed)
Patient report given to this nurse. Assuming care of patient. Patient in no distress. Equal chest rise and fall. No distress. Pain 2\10. Husband at bedside. Call bell within reach.

## 2011-06-17 NOTE — ED Notes (Addendum)
Pt states she has had high blood sugar for last 3 days. She removed her insulin pump earlier today. She states she took 60 units of Lantus at 1030 this AM. At home glucose has been in 400 and 500 range she states "despite the Novalog and Lantus I have been taking". Pt c/o nausea. Pt states her PCP is Karin Lieu, [place] at [facility] in [place].

## 2011-06-17 NOTE — ED Notes (Signed)
Patient given Diet Coke per request. In no distress. Denies any needs. Call bell within reach. Normal saline infusing well with no signs of infiltration.

## 2011-06-17 NOTE — ED Notes (Signed)
Pt c/o high blood sugar and cp/n today.

## 2011-06-17 NOTE — ED Notes (Signed)
Remains sitting up in bed resting. Pain 2\10. Equal chest rise and fall. Denies any needs. No distress. Husband at bedside. Call bell within reach.

## 2011-06-17 NOTE — ED Notes (Signed)
Patient back to room for radiology.

## 2011-06-17 NOTE — ED Notes (Signed)
Normal saline infusion complete. IV rate decreased to KVO. No signs of infiltration. Pain 2\10. Call bell within reach. No distress. Denies needs.

## 2011-06-17 NOTE — ED Notes (Signed)
Patient to radiology via stretcher

## 2011-06-17 NOTE — ED Provider Notes (Signed)
History     CSN: [id number]  Arrival date & time 06/17/11  1722   First MD Initiated Contact with Patient 06/17/11 1741      Chief Complaint  Patient presents with  . Hyperglycemia  . Chest Pain  . Nausea    (Consider location/radiation/quality/duration/timing/severity/associated sxs/prior treatment) HPI Patient presents with complaint of elevated blood sugar today. She uses an insulin pump and states that she had to take out the insulin pump today do to a problem with her insulin prescription. Her blood sugars have been running in the 500s all day. She uses Lantus 60 mg this morning in place of her insulin pump. She is also supplemented with 30 mg of NovoLog throughout the day. She denies fevers or vomiting. She has had cough and cold symptoms for the past 2 weeks. She has had a mildly productive cough which continues today. She also describes some mild intermittent chest pain which is worse with coughing. She denies dysuria but has been urinating frequently today. She states her blood sugar has been elevated for the past 2-3 days but today he did not respond to the supplemental NovoLog which prompted her ED visit. There no other alleviating or modifying factors. She does endorse some increased anxiety along with having blood sugar elevation. There no other associated systemic symptoms. Past Medical History  Diagnosis Date  . Hypertension   . Diabetes mellitus   . Thyroid disease   . Bipolar disorder   . Renal disorder   . Renal insufficiency     Past Surgical History  Procedure Date  . Ovary removed   . Nasal fracture surgery     History reviewed. No pertinent family history.  History  Substance Use Topics  . Smoking status: Current Everyday Smoker  . Smokeless tobacco: Not on file  . Alcohol Use: No    OB History    Grav Para Term Preterm Abortions TAB SAB Ect Mult Living                  Review of Systems ROS reviewed and otherwise negative except for mentioned  in HPI  Allergies  Review of patient's allergies indicates no known allergies.  Home Medications   Current Outpatient Rx  Name Route Sig Dispense Refill  . ACETAMINOPHEN 500 MG PO TABS Oral Take 500 mg by mouth as needed. For pain    . ALPRAZOLAM 1 MG PO TABS Oral Take 1 mg by mouth daily as needed. for anxiety    . ASPIRIN EC 81 MG PO TBEC Oral Take 81 mg by mouth daily.      Marland Kitchen CARVEDILOL 25 MG PO TABS Oral Take 25 mg by mouth 2 (two) times daily with a meal.      . ESCITALOPRAM OXALATE 10 MG PO TABS Oral Take 10 mg by mouth daily.      . OMEGA-3 FATTY ACIDS 1000 MG PO CAPS Oral Take 1 g by mouth at bedtime.     Marland Kitchen HYDROCHLOROTHIAZIDE 25 MG PO TABS Oral Take 25 mg by mouth daily.      . INSULIN ASPART 100 UNIT/ML [place] SOLN Subcutaneous Inject into the skin as directed.     Marland Kitchen LAMOTRIGINE 200 MG PO TABS Oral Take 200 mg by mouth at bedtime.     Marland Kitchen LEVOTHYROXINE SODIUM 88 MCG PO TABS Oral Take 88 mcg by mouth daily.      Marland Kitchen LISINOPRIL 20 MG PO TABS Oral Take 20 mg by mouth 2 (two) times daily.      Marland Kitchen  MIRTAZAPINE 30 MG PO TABS Oral Take 30 mg by mouth at bedtime.      . ADULT MULTIVITAMIN W/MINERALS CH Oral Take 1 tablet by mouth daily.    Marland Kitchen PRAVASTATIN SODIUM 20 MG PO TABS Oral Take 20 mg by mouth at bedtime.    Marland Kitchen RANITIDINE HCL 150 MG PO TABS Oral Take 150 mg by mouth daily as needed. For acid reflux     . SOLIFENACIN SUCCINATE 10 MG PO TABS Oral Take 10 mg by mouth daily.    Marland Kitchen VITAMIN D (ERGOCALCIFEROL) 50000 UNITS PO CAPS Oral Take 50,000 Units by mouth every 7 (seven) days. On  WEDNESDAYS    . ZIPRASIDONE HCL 60 MG PO CAPS Oral Take 60 mg by mouth at bedtime.       BP 134/84  Pulse 66  Temp(Src) 98.3 F (36.8 C) (Oral)  Resp 20  Ht 5\' 10"  (1.778 m)  Wt 290 lb (131.543 kg)  BMI 41.61 kg/m2  SpO2 97% Vitals reviewed Physical Exam Physical Examination: General appearance - alert, well appearing, and in no distress Mental status - alert, oriented to person, place, and time Eyes -  pupils equal and reactive, extraocular eye movements intact Mouth - mucous membranes moist, pharynx normal without lesions Chest - clear to auscultation, no wheezes, rales or rhonchi, symmetric air entry Heart - normal rate, regular rhythm, normal S1, S2, no murmurs, rubs, clicks or gallops Abdomen - soft, nontender, nondistended, no masses or organomegaly Musculoskeletal - no joint tenderness, deformity or swelling Extremities - peripheral pulses normal, no pedal edema, no clubbing or cyanosis Skin - normal coloration and turgor, no rashes  ED Course  Procedures (including critical care time)  Date: 06/17/2011  Rate: 68  Rhythm: normal sinus rhythm  QRS Axis: normal  Intervals: normal  ST/T Wave abnormalities: normal, poor r wave progression  Conduction Disutrbances:none  Narrative Interpretation:   Old EKG Reviewed: none available  8:32 PM  Inquired about delay in obtaining labs, I was told results would be available in about 10 minutes.      Labs Reviewed  CBC - Abnormal; Notable for the following:    WBC 10.9 (*)    All other components within normal limits  BASIC METABOLIC PANEL - Abnormal; Notable for the following:    Sodium 132 (*)    Glucose, Bld 225 (*)    Creatinine, Ser 1.59 (*)    GFR calc non Af Amer 40 (*)    GFR calc Af Amer 46 (*)    All other components within normal limits  URINALYSIS, ROUTINE W REFLEX MICROSCOPIC - Abnormal; Notable for the following:    Specific Gravity, Urine <1.005 (*)    Glucose, UA 500 (*)    Protein, ur 30 (*)    All other components within normal limits  GLUCOSE, CAPILLARY - Abnormal; Notable for the following:    Glucose-Capillary 336 (*)    All other components within normal limits  GLUCOSE, CAPILLARY - Abnormal; Notable for the following:    Glucose-Capillary 173 (*)    All other components within normal limits  GLUCOSE, CAPILLARY - Abnormal; Notable for the following:    Glucose-Capillary 145 (*)    All other components  within normal limits  URINE MICROSCOPIC-ADD ON  POCT CBG MONITORING   Dg Chest 2 View  06/17/2011  *RADIOLOGY REPORT*  Clinical Data: Cough.  CHEST - 2 VIEW  Comparison: Chest x-ray 01/24/2011.  Findings: The cardiac silhouette, mediastinal and hilar contours are within normal  limits and stable.  Low lung volumes with vascular crowding and streaky atelectasis.  No infiltrates, edema or effusions.  IMPRESSION: Low lung volumes with mild vascular crowding and streaky atelectasis.  Original Report Authenticated By: P. Kalman Jewels, M.D.     1. Hyperglycemia   2. Renal insufficiency       MDM  Patient with history of diabetes presenting with hyperglycemia. She is usually on an insulin pump but has stopped this as of today do to the problem with her insulin prescription. She used Lantus this morning instead but has had poorly controlled blood sugar today. After IV fluids here in the emergency department her blood sugar decreased to below 200. She has no signs of DKA. She is well-hydrated and nontoxic in appearance. No acute infections were found. Plan is for discharge to continue Lantus and Novolin supplementation as needed. She has tolerated fluids orally here in the emergency department. She is agreeable with the plan for discharge.        Threasa Beards, MD 06/17/11 2132

## 2011-06-17 NOTE — ED Notes (Signed)
Patients CBG 173.

## 2011-07-21 ENCOUNTER — Other Ambulatory Visit: Payer: Self-pay | Admitting: Urology

## 2011-08-09 ENCOUNTER — Encounter (HOSPITAL_COMMUNITY): Payer: Self-pay | Admitting: Pharmacy Technician

## 2011-08-10 ENCOUNTER — Encounter (HOSPITAL_COMMUNITY): Payer: Self-pay

## 2011-08-10 ENCOUNTER — Encounter (HOSPITAL_COMMUNITY)
Admission: RE | Admit: 2011-08-10 | Discharge: 2011-08-10 | Disposition: A | Discharge status: Home / Self Care | Payer: Medicare Other | Source: Ambulatory Visit | Attending: Urology | Admitting: Urology

## 2011-08-10 HISTORY — DX: Pure hypercholesterolemia, unspecified: E78.00

## 2011-08-10 HISTORY — DX: Major depressive disorder, single episode, unspecified: F32.9

## 2011-08-10 HISTORY — DX: Depression, unspecified: F32.A

## 2011-08-10 HISTORY — DX: Sleep apnea, unspecified: G47.30

## 2011-08-10 HISTORY — DX: Gastro-esophageal reflux disease without esophagitis: K21.9

## 2011-08-10 HISTORY — DX: Anxiety disorder, unspecified: F41.9

## 2011-08-10 HISTORY — DX: Hypothyroidism, unspecified: E03.9

## 2011-08-10 HISTORY — DX: Stress incontinence (female) (male): N39.3

## 2011-08-10 LAB — BASIC METABOLIC PANEL
CO2: 26 mEq/L (ref 19–32)
Calcium: 9.6 mg/dL (ref 8.4–10.5)
GFR calc Af Amer: 35 mL/min — ABNORMAL LOW (ref >90)
Sodium: 136 mEq/L (ref 135–145)

## 2011-08-10 LAB — SURGICAL PCR SCREEN
MRSA, PCR: NEGATIVE
Staphylococcus aureus: NEGATIVE

## 2011-08-10 LAB — CBC
MCH: 30.2 pg (ref 26.0–34.0)
Platelets: 302 10*3/uL (ref 150–400)
RBC: 4.63 MIL/uL (ref 3.87–5.11)
RDW: 13 % (ref 11.5–15.5)
WBC: 10.2 10*3/uL (ref 4.0–10.5)

## 2011-08-10 NOTE — Pre-Procedure Instructions (Signed)
4:45PM RECEIVED CALL FROM LAB CONCERNING CRITICAL GLUCOSE LEVEL OF 44 . ATTEMPTED TO CONTACT PT -MESSAGE LEFT ON PHONE. MESSAGE LEFT WITH [facility]ASTITY AT OFFICE FOR DR. Risa Grill. 4:50 PM PT RETURNED CALL AND STATED SHE FELT HER SUGAR DROPPING AND ATE FOOD AFTER LEAVING PST AND IS FEELING MUCH BETTER. INSTRUCTED TO CHECK BLOOD SUGAR FREQUENTLY UNTIL STABILIZED. PT STATES SHE WOULD CHECK BLOOD SUGARS.

## 2011-08-10 NOTE — Patient Instructions (Addendum)
[place]  08/10/2011   Your procedure is scheduled on:  08/16/11  Report to [facility]  at 6:30 AM.  Call this number if you have problems the morning of surgery: [phone]   Remember:   Do not eat food or drink liquids AFTER MIDNIGHT    Take these medicines the morning of surgery with A SIP OF WATER: CARVEDILOL / LEXAPRO / LEVTHYROXINE / RANITIDINE / MAY TAKE XANAX IF NEEDED               TAKE 1/2 DOSE OF INSULIN THE NIGHT BEFORE SURGERY / NO INSULIN THE MORNING OF SURGERY   Do not wear jewelry, make-up or nail polish.  Do not wear lotions, powders, or perfumes.   Do not shave legs or underarms 48 hrs. before surgery (men may shave face)  Do not bring valuables to the hospital.  Contacts, dentures or bridgework may not be worn into surgery.  Leave suitcase in the car. After surgery it may be brought to your room.  For patients admitted to the hospital, checkout time is 11:00 AM the day of discharge.   Patients discharged the day of surgery will not be allowed to drive home.  Name and phone number of your driver:   Special Instructions:   Please read over the following fact sheets that you were given: MRSA  Information               [place]

## 2011-08-12 NOTE — Pre-Procedure Instructions (Signed)
SPOKE WITH PAM GIBSON CONCERNING BMET - SHE WILL LET DR. Risa Grill KNOW TO LOOK IN COMPUTER

## 2011-08-13 NOTE — H&P (Signed)
Jasmine [name] is an 41 y.o. female.   Chief Complaint: Jasmine [name] HPI: Jasmine [name] returns today status post her urodynamics. Again, last several notes outline her history. She has been plagued more and more by significant problematic incontinence. By history it is almost entirely stress based. She does wear 2-3 thick pads a day and this is greatly affecting her quality of life. She was put on some medication for bladder overactivity and that helped slightly, but not with regards to the stress incontinence, which of course is not surprising. The patient recently had urodynamics. She had excellent capacity with some slightly reduced sensation. She appeared to empty her bladder nicely. There was some minimal instability, but very low contractions and it did not appear to be problematic. She clearly had documented stress incontinence with positive urethral hypermobility and a fairly low leak-point pressure and documented at least moderate objective incontinence. She was not felt to have a particularly strong detrusor contraction at the end and did use some abdominal straining. Again, she is here to discuss alternatives with regard to therapy for this issue. Again, her urinalysis today really is unremarkable without sign of any recurrent infection.   Past Medical History  Diagnosis Date  . Hypertension   . Diabetes mellitus   . Thyroid disease   . Bipolar disorder   . Renal disorder   . Renal insufficiency   . Elevated cholesterol   . GERD (gastroesophageal reflux disease)   . Stress incontinence   . Hypothyroidism   . Sleep apnea     HAS C -PAP / DOES NOT USE  . Anxiety   . Depression     Past Surgical History  Procedure Date  . Ovary removed   . Nasal fracture surgery   . Uterine fibroid surgery 2001    No family history on file. Social History:  reports that she has been smoking.  She does not have any smokeless tobacco history on file. She reports that she does not drink alcohol or use  illicit drugs.  Allergies: No Known Allergies  No current facility-administered medications on file as of .   Medications Prior to Admission  Medication Sig Dispense Refill  . acetaminophen (TYLENOL) 500 MG tablet Take 1,000 mg by mouth every 6 (six) hours as needed. For pain      . ALPRAZolam (XANAX) 1 MG tablet Take 0.5 mg by mouth 3 (three) times daily as needed. for anxiety and sleep      . aspirin EC 81 MG tablet Take 81 mg by mouth daily before breakfast.       . carvedilol (COREG) 25 MG tablet Take 25 mg by mouth 2 (two) times daily with a meal.       . escitalopram (LEXAPRO) 10 MG tablet Take 10 mg by mouth daily before breakfast.       . fish oil-omega-3 fatty acids 1000 MG capsule Take 1 g by mouth at bedtime.       . hydrochlorothiazide 25 MG tablet Take 25 mg by mouth daily before breakfast.       . insulin aspart (NOVOLOG) 100 UNIT/ML injection Inject 1.8-2.35 Units into the skin continuous. Basal rate at 9 am-8 pm=2.35 units; 8 pm-9 am= 1.8 units      . lamoTRIgine (LAMICTAL) 200 MG tablet Take 200 mg by mouth at bedtime.       Marland Kitchen levothyroxine (SYNTHROID, LEVOTHROID) 88 MCG tablet Take 88 mcg by mouth daily before breakfast.       .  lisinopril (PRINIVIL,ZESTRIL) 20 MG tablet Take 20 mg by mouth 2 (two) times daily.       . mirtazapine (REMERON) 30 MG tablet Take 30 mg by mouth at bedtime.       . pravastatin (PRAVACHOL) 20 MG tablet Take 20 mg by mouth at bedtime.      . ranitidine (ZANTAC) 150 MG tablet Take 150 mg by mouth daily as needed. For acid reflux      . Vitamin D, Ergocalciferol, (DRISDOL) 50000 UNITS CAPS Take 50,000 Units by mouth every 7 (seven) days. On  WEDNESDAYS      . ziprasidone (GEODON) 60 MG capsule Take 60 mg by mouth at bedtime.         No results found for this or any previous visit (from the past 48 hour(s)). No results found.  Review of Systems - Negative except Jasmine [name], urgency, frequency,polydipsia,headaches, night sweats and heartburn  There were  no vitals taken for this visit. General appearance: alert, cooperative and no distress Neck: no adenopathy and no JVD Resp: clear to auscultation bilaterally Cardio: regular rate and rhythm GI: soft, non-tender; bowel sounds normal; no masses,  no organomegaly Pelvic: external genitalia normal Positive urethral hypermobility Extremities: extremities normal, atraumatic, no cyanosis or edema Skin: Skin color, texture, turgor normal. No rashes or lesions Neurologic: Grossly normal  Assessment/Plan Jasmine [name]'s main issue at this time is her progressive incontinence. Again, she finds this particularly problematic. I do think that objectively the urodynamics did confirm what she was telling us. She clearly has urethral hypermobility with a fairly low leak-point pressure and clear evidence of incontinence. She did not have any gross bladder instability. She may have a slightly reduced detrusor contraction. I do not think she is likely to respond to conservative therapy with Kegel exercises. There is really no medical options. She is certainly a candidate for suburethral sling. We did talk about the pros and cons of that. She understands that mesh is somewhat controversial, but typically more problematic in major reconstructive surgeries rather than suburethral slings. She is told that we do have occasional patients who do have small degrees of erosion but with more modern slings I really have not had any major problematic issues. We talked about result rates. She understands that over tightening can lead to incomplete bladder emptying and a rare need to loosen the sling. Some patients do have recurrent leakage and procedures do need to be redone on occasion. She is interested in proceeding surgery.  Jasmine [name] S 08/13/2011, 8:01 AM

## 2011-08-16 ENCOUNTER — Encounter (HOSPITAL_COMMUNITY)
Admission: RE | Disposition: A | Discharge status: Home / Self Care | Payer: Self-pay | Source: Ambulatory Visit | Attending: Urology

## 2011-08-16 ENCOUNTER — Encounter (HOSPITAL_COMMUNITY): Payer: Self-pay | Admitting: *Deleted

## 2011-08-16 ENCOUNTER — Ambulatory Visit (HOSPITAL_COMMUNITY): Payer: Medicare Other | Admitting: Anesthesiology

## 2011-08-16 ENCOUNTER — Ambulatory Visit (HOSPITAL_COMMUNITY)
Admission: RE | Admit: 2011-08-16 | Discharge: 2011-08-17 | Disposition: A | Discharge status: Home / Self Care | Payer: Medicare Other | Source: Ambulatory Visit | Attending: Urology | Admitting: Urology

## 2011-08-16 ENCOUNTER — Encounter (HOSPITAL_COMMUNITY): Payer: Self-pay | Admitting: Anesthesiology

## 2011-08-16 DIAGNOSIS — Z794 Long term (current) use of insulin: Secondary | ICD-10-CM | POA: Insufficient documentation

## 2011-08-16 DIAGNOSIS — Z01812 Encounter for preprocedural laboratory examination: Secondary | ICD-10-CM | POA: Insufficient documentation

## 2011-08-16 DIAGNOSIS — I10 Essential (primary) hypertension: Secondary | ICD-10-CM | POA: Insufficient documentation

## 2011-08-16 DIAGNOSIS — E039 Hypothyroidism, unspecified: Secondary | ICD-10-CM | POA: Insufficient documentation

## 2011-08-16 DIAGNOSIS — N393 Stress incontinence (female) (male): Secondary | ICD-10-CM

## 2011-08-16 DIAGNOSIS — E119 Type 2 diabetes mellitus without complications: Secondary | ICD-10-CM | POA: Insufficient documentation

## 2011-08-16 DIAGNOSIS — Z7982 Long term (current) use of aspirin: Secondary | ICD-10-CM | POA: Insufficient documentation

## 2011-08-16 DIAGNOSIS — Z79899 Other long term (current) drug therapy: Secondary | ICD-10-CM | POA: Insufficient documentation

## 2011-08-16 DIAGNOSIS — K219 Gastro-esophageal reflux disease without esophagitis: Secondary | ICD-10-CM | POA: Insufficient documentation

## 2011-08-16 DIAGNOSIS — G4730 Sleep apnea, unspecified: Secondary | ICD-10-CM | POA: Insufficient documentation

## 2011-08-16 HISTORY — PX: PUBOVAGINAL SLING: SHX1035

## 2011-08-16 LAB — GLUCOSE, CAPILLARY
Glucose-Capillary: 185 mg/dL — ABNORMAL HIGH (ref 70–99)
Glucose-Capillary: 352 mg/dL — ABNORMAL HIGH (ref 70–99)

## 2011-08-16 SURGERY — CREATION, PUBOVAGINAL SLING
Anesthesia: General | Site: Urethra | Wound class: Clean Contaminated

## 2011-08-16 MED ORDER — OXYBUTYNIN CHLORIDE 5 MG PO TABS
5.0000 mg | ORAL_TABLET | Freq: Three times a day (TID) | ORAL | Status: DC | PRN
  Filled 2011-08-16: qty 1

## 2011-08-16 MED ORDER — LISINOPRIL 20 MG PO TABS
20.0000 mg | ORAL_TABLET | Freq: Two times a day (BID) | ORAL | Status: DC
  Administered 2011-08-16: 20 mg via ORAL
  Filled 2011-08-16 (×4): qty 1

## 2011-08-16 MED ORDER — INSULIN ASPART 100 UNIT/ML ~~LOC~~ SOLN
0.0000 [IU] | Freq: Three times a day (TID) | SUBCUTANEOUS | Status: DC
  Administered 2011-08-16: 15 [IU] via SUBCUTANEOUS
  Administered 2011-08-16: 3 [IU] via SUBCUTANEOUS
  Administered 2011-08-17: 15 [IU] via SUBCUTANEOUS

## 2011-08-16 MED ORDER — LACTATED RINGERS IV SOLN
INTRAVENOUS | Status: DC | PRN
  Administered 2011-08-16 (×2): via INTRAVENOUS

## 2011-08-16 MED ORDER — SIMVASTATIN 10 MG PO TABS
10.0000 mg | ORAL_TABLET | Freq: Every day | ORAL | Status: DC
Start: 2011-08-16 — End: 2011-08-17
  Administered 2011-08-16: 10 mg via ORAL
  Filled 2011-08-16 (×3): qty 1

## 2011-08-16 MED ORDER — KCL IN DEXTROSE-NACL 20-5-0.45 MEQ/L-%-% IV SOLN
INTRAVENOUS | Status: DC
  Administered 2011-08-16: 14:00:00 via INTRAVENOUS
  Filled 2011-08-16 (×3): qty 1000

## 2011-08-16 MED ORDER — INSULIN ASPART 100 UNIT/ML ~~LOC~~ SOLN
SUBCUTANEOUS | Status: AC
  Administered 2011-08-16: 5 [IU] via SUBCUTANEOUS
  Filled 2011-08-16: qty 3

## 2011-08-16 MED ORDER — CIPROFLOXACIN IN D5W 400 MG/200ML IV SOLN
400.0000 mg | INTRAVENOUS | Status: AC
  Administered 2011-08-16: 400 mg via INTRAVENOUS

## 2011-08-16 MED ORDER — CARVEDILOL 25 MG PO TABS
25.0000 mg | ORAL_TABLET | Freq: Two times a day (BID) | ORAL | Status: DC
  Administered 2011-08-16: 25 mg via ORAL
  Filled 2011-08-16 (×4): qty 1

## 2011-08-16 MED ORDER — ACETAMINOPHEN 10 MG/ML IV SOLN
INTRAVENOUS | Status: AC
  Filled 2011-08-16: qty 100

## 2011-08-16 MED ORDER — BUPIVACAINE HCL (PF) 0.5 % IJ SOLN
INTRAMUSCULAR | Status: AC
  Filled 2011-08-16: qty 30

## 2011-08-16 MED ORDER — STERILE WATER FOR IRRIGATION IR SOLN
Status: DC | PRN
  Administered 2011-08-16: 1000 mL via INTRAVESICAL

## 2011-08-16 MED ORDER — MIRTAZAPINE 30 MG PO TABS
30.0000 mg | ORAL_TABLET | Freq: Every day | ORAL | Status: DC
  Administered 2011-08-16: 30 mg via ORAL
  Filled 2011-08-16 (×3): qty 1

## 2011-08-16 MED ORDER — LACTATED RINGERS IV SOLN: INTRAVENOUS | Status: DC

## 2011-08-16 MED ORDER — HYDROMORPHONE HCL PF 1 MG/ML IJ SOLN: 0.2500 mg | INTRAMUSCULAR | Status: DC | PRN

## 2011-08-16 MED ORDER — LAMOTRIGINE 200 MG PO TABS
200.0000 mg | ORAL_TABLET | Freq: Every day | ORAL | Status: DC
  Administered 2011-08-16: 200 mg via ORAL
  Filled 2011-08-16 (×3): qty 1

## 2011-08-16 MED ORDER — CIPROFLOXACIN IN D5W 400 MG/200ML IV SOLN
INTRAVENOUS | Status: AC
  Filled 2011-08-16: qty 200

## 2011-08-16 MED ORDER — BACITRACIN ZINC 500 UNIT/GM EX OINT
TOPICAL_OINTMENT | CUTANEOUS | Status: AC
  Filled 2011-08-16: qty 15

## 2011-08-16 MED ORDER — ESCITALOPRAM OXALATE 10 MG PO TABS
10.0000 mg | ORAL_TABLET | Freq: Every day | ORAL | Status: DC
Start: 2011-08-17 — End: 2011-08-17
  Filled 2011-08-16 (×2): qty 1

## 2011-08-16 MED ORDER — ONDANSETRON HCL 4 MG/2ML IJ SOLN
INTRAMUSCULAR | Status: DC | PRN
  Administered 2011-08-16: 4 mg via INTRAVENOUS

## 2011-08-16 MED ORDER — ONDANSETRON HCL 4 MG/2ML IJ SOLN: 4.0000 mg | INTRAMUSCULAR | Status: DC | PRN

## 2011-08-16 MED ORDER — 0.9 % SODIUM CHLORIDE (POUR BTL) OPTIME
TOPICAL | Status: DC | PRN
  Administered 2011-08-16: 1000 mL

## 2011-08-16 MED ORDER — INSULIN GLARGINE 100 UNIT/ML ~~LOC~~ SOLN
40.0000 [IU] | Freq: Every day | SUBCUTANEOUS | Status: DC
  Administered 2011-08-16: 40 [IU] via SUBCUTANEOUS

## 2011-08-16 MED ORDER — MICROFIBRILLAR COLL HEMOSTAT EX PADS
MEDICATED_PAD | CUTANEOUS | Status: DC | PRN
  Administered 2011-08-16: 1 via TOPICAL

## 2011-08-16 MED ORDER — MORPHINE SULFATE 2 MG/ML IJ SOLN: 2.0000 mg | INTRAMUSCULAR | Status: DC | PRN

## 2011-08-16 MED ORDER — ZIPRASIDONE HCL 60 MG PO CAPS
60.0000 mg | ORAL_CAPSULE | Freq: Every day | ORAL | Status: DC
  Administered 2011-08-16: 60 mg via ORAL
  Filled 2011-08-16 (×3): qty 1

## 2011-08-16 MED ORDER — HYDROCODONE-ACETAMINOPHEN 5-325 MG PO TABS
1.0000 | ORAL_TABLET | ORAL | Status: DC | PRN
  Administered 2011-08-16: 2 via ORAL
  Administered 2011-08-16: 1 via ORAL
  Filled 2011-08-16: qty 1
  Filled 2011-08-16: qty 2

## 2011-08-16 MED ORDER — HYDROCHLOROTHIAZIDE 25 MG PO TABS
25.0000 mg | ORAL_TABLET | Freq: Every day | ORAL | Status: DC
  Filled 2011-08-16 (×2): qty 1

## 2011-08-16 MED ORDER — LEVOTHYROXINE SODIUM 88 MCG PO TABS
88.0000 ug | ORAL_TABLET | Freq: Every day | ORAL | Status: DC
  Filled 2011-08-16 (×2): qty 1

## 2011-08-16 MED ORDER — PROPOFOL 10 MG/ML IV BOLUS
INTRAVENOUS | Status: DC | PRN
  Administered 2011-08-16: 180 mg via INTRAVENOUS

## 2011-08-16 MED ORDER — INSULIN ASPART 100 UNIT/ML ~~LOC~~ SOLN: 0.0000 [IU] | SUBCUTANEOUS | Status: DC

## 2011-08-16 MED ORDER — LIDOCAINE-EPINEPHRINE (PF) 1 %-1:200000 IJ SOLN
INTRAMUSCULAR | Status: AC
  Filled 2011-08-16: qty 10

## 2011-08-16 MED ORDER — FENTANYL CITRATE 0.05 MG/ML IJ SOLN
INTRAMUSCULAR | Status: DC | PRN
  Administered 2011-08-16: 25 ug via INTRAVENOUS
  Administered 2011-08-16: 50 ug via INTRAVENOUS
  Administered 2011-08-16: 25 ug via INTRAVENOUS

## 2011-08-16 MED ORDER — ESTRADIOL 0.1 MG/GM VA CREA
TOPICAL_CREAM | VAGINAL | Status: AC
  Filled 2011-08-16: qty 42.5

## 2011-08-16 MED ORDER — ALPRAZOLAM 0.5 MG PO TABS
0.5000 mg | ORAL_TABLET | Freq: Three times a day (TID) | ORAL | Status: DC | PRN
  Filled 2011-08-16: qty 1

## 2011-08-16 MED ORDER — BACITRACIN ZINC 500 UNIT/GM EX OINT
TOPICAL_OINTMENT | CUTANEOUS | Status: DC | PRN
  Administered 2011-08-16: 1 via TOPICAL

## 2011-08-16 MED ORDER — MIDAZOLAM HCL 5 MG/5ML IJ SOLN
INTRAMUSCULAR | Status: DC | PRN
  Administered 2011-08-16: 1 mg via INTRAVENOUS

## 2011-08-16 MED ORDER — PROMETHAZINE HCL 25 MG/ML IJ SOLN: 6.2500 mg | INTRAMUSCULAR | Status: DC | PRN

## 2011-08-16 MED ORDER — CIPROFLOXACIN IN D5W 400 MG/200ML IV SOLN
400.0000 mg | Freq: Two times a day (BID) | INTRAVENOUS | Status: DC
  Administered 2011-08-16: 400 mg via INTRAVENOUS
  Filled 2011-08-16 (×4): qty 200

## 2011-08-16 MED ORDER — LIDOCAINE HCL (CARDIAC) 20 MG/ML IV SOLN
INTRAVENOUS | Status: DC | PRN
  Administered 2011-08-16: 100 mg via INTRAVENOUS

## 2011-08-16 MED ORDER — DOCUSATE SODIUM 100 MG PO CAPS
100.0000 mg | ORAL_CAPSULE | Freq: Two times a day (BID) | ORAL | Status: DC
  Administered 2011-08-16: 100 mg via ORAL
  Filled 2011-08-16 (×4): qty 1

## 2011-08-16 MED ORDER — ACETAMINOPHEN 10 MG/ML IV SOLN
INTRAVENOUS | Status: DC | PRN
  Administered 2011-08-16: 1000 mg via INTRAVENOUS

## 2011-08-16 MED ORDER — LIDOCAINE-EPINEPHRINE (PF) 1 %-1:200000 IJ SOLN
INTRAMUSCULAR | Status: DC | PRN
  Administered 2011-08-16: 4 mL

## 2011-08-16 MED FILL — Insulin Aspart Inj 100 Unit/ML: SUBCUTANEOUS | Qty: 0.1 | Status: AC

## 2011-08-16 SURGICAL SUPPLY — 52 items
BAG URINE DRAINAGE (UROLOGICAL SUPPLIES) ×2 IMPLANT
BAG URO CATCHER STRL LF (DRAPE) ×2 IMPLANT
BENZOIN TINCTURE PRP APPL 2/3 (GAUZE/BANDAGES/DRESSINGS) IMPLANT
BLADE HEX COATED 2.75 (ELECTRODE) IMPLANT
BLADE SURG 15 STRL LF DISP TIS (BLADE) ×2 IMPLANT
BLADE SURG 15 STRL SS (BLADE) ×2
BLADE SURG SZ10 CARB STEEL (BLADE) IMPLANT
CANISTER SUCT LVC 12 LTR MEDI- (MISCELLANEOUS) IMPLANT
CATH FOLEY 2WAY SLVR  5CC 16FR (CATHETERS) ×1
CATH FOLEY 2WAY SLVR 5CC 16FR (CATHETERS) ×1 IMPLANT
CLOTH BEACON ORANGE TIMEOUT ST (SAFETY) ×2 IMPLANT
COVER SURGICAL LIGHT HANDLE (MISCELLANEOUS) ×2 IMPLANT
DECANTER SPIKE VIAL GLASS SM (MISCELLANEOUS) ×2 IMPLANT
DERMABOND ADVANCED (GAUZE/BANDAGES/DRESSINGS) ×1
DERMABOND ADVANCED .7 DNX12 (GAUZE/BANDAGES/DRESSINGS) ×1 IMPLANT
DISSECTOR ROUND CHERRY 3/8 STR (MISCELLANEOUS) IMPLANT
DRAPE CAMERA CLOSED 9X96 (DRAPES) IMPLANT
DRAPE LG THREE QUARTER DISP (DRAPES) ×2 IMPLANT
DRSG TEGADERM 4X4.75 (GAUZE/BANDAGES/DRESSINGS) IMPLANT
ELECT REM PT RETURN 9FT ADLT (ELECTROSURGICAL) ×2
ELECTRODE REM PT RTRN 9FT ADLT (ELECTROSURGICAL) ×1 IMPLANT
GAUZE PACKING IODOFORM 2 (PACKING) IMPLANT
GLOVE BIOGEL M STRL SZ7.5 (GLOVE) ×2 IMPLANT
GLOVE BIOGEL PI IND STRL 6.5 (GLOVE) ×1 IMPLANT
GLOVE BIOGEL PI INDICATOR 6.5 (GLOVE) ×1
GLOVE SURG SS PI 6.5 STRL IVOR (GLOVE) ×2 IMPLANT
GOWN PREVENTION PLUS XLARGE (GOWN DISPOSABLE) ×2 IMPLANT
GOWN STRL NON-REIN LRG LVL3 (GOWN DISPOSABLE) ×2 IMPLANT
GOWN STRL REIN XL XLG (GOWN DISPOSABLE) IMPLANT
HEMOSTAT SURGICEL 2X3 (HEMOSTASIS) ×2 IMPLANT
KIT BASIN OR (CUSTOM PROCEDURE TRAY) ×2 IMPLANT
NEEDLE HYPO 22GX1.5 SAFETY (NEEDLE) ×2 IMPLANT
NS IRRIG 1000ML POUR BTL (IV SOLUTION) ×2 IMPLANT
PACK CYSTO (CUSTOM PROCEDURE TRAY) IMPLANT
PACK GENERAL/GYN (CUSTOM PROCEDURE TRAY) ×2 IMPLANT
PACKING VAGINAL (PACKING) ×2 IMPLANT
PENCIL BUTTON HOLSTER BLD 10FT (ELECTRODE) IMPLANT
PLUG CATH AND CAP STER (CATHETERS) ×2 IMPLANT
SET CYSTO W/LG BORE CLAMP LF (SET/KITS/TRAYS/PACK) ×2 IMPLANT
SHEET LAVH (DRAPES) ×2 IMPLANT
SLING LYNX SUPRAPUBIC (Sling) ×2 IMPLANT
STAPLER VISISTAT 35W (STAPLE) ×2 IMPLANT
STRIP CLOSURE SKIN 1/4X4 (GAUZE/BANDAGES/DRESSINGS) IMPLANT
SUT ETHILON 1 TP 1 60 (SUTURE) IMPLANT
SUT SILK 2 0 30  PSL (SUTURE) ×1
SUT SILK 2 0 30 PSL (SUTURE) ×1 IMPLANT
SUT VIC AB 2-0 UR6 27 (SUTURE) ×4 IMPLANT
SUT VICRYL 3 0 UR 6 27 (SUTURE) IMPLANT
SYRINGE 10CC LL (SYRINGE) ×2 IMPLANT
TOWEL NATURAL 10PK STERILE (DISPOSABLE) ×2 IMPLANT
WATER STERILE IRR 1500ML POUR (IV SOLUTION) IMPLANT
YANKAUER SUCT BULB TIP 10FT TU (MISCELLANEOUS) IMPLANT

## 2011-08-16 NOTE — Anesthesia Postprocedure Evaluation (Signed)
  Anesthesia Post-op Note  Patient: Jasmine [name]  Procedure(s) Performed: Procedure(s) (LRB): PUBO-VAGINAL SLING (N/A)  Patient Location: PACU  Anesthesia Type: General  Level of Consciousness: awake and alert   Airway and Oxygen Therapy: Patient Spontanous Breathing  Post-op Pain: mild  Post-op Assessment: Post-op Vital signs reviewed, Patient's Cardiovascular Status Stable, Respiratory Function Stable, Patent Airway and No signs of Nausea or vomiting  Post-op Vital Signs: stable  Complications: No apparent anesthesia complications

## 2011-08-16 NOTE — Progress Notes (Signed)
CBG 237 Dr. Thereasa Parkin paged x2. Marland Kitchen No insulin coverage given in short stay

## 2011-08-16 NOTE — Transfer of Care (Signed)
Immediate Anesthesia Transfer of Care Note  Patient: Jasmine [name]  Procedure(s) Performed: Procedure(s) (LRB): PUBO-VAGINAL SLING (N/A)  Patient Location: PACU  Anesthesia Type: General  Level of Consciousness: awake, alert  and oriented  Airway & Oxygen Therapy: Patient Spontanous Breathing and Patient connected to face mask oxygen  Post-op Assessment: Report given to PACU RN and Post -op Vital signs reviewed and stable  Post vital signs: Reviewed and stable  Complications: No apparent anesthesia complications

## 2011-08-16 NOTE — Interval H&P Note (Signed)
History and Physical Interval Note:  08/16/2011 9:10 AM  Jasmine [name]  has presented today for surgery, with the diagnosis of Stress Urge Incontinence  The various methods of treatment have been discussed with the patient and family. After consideration of risks, benefits and other options for treatment, the patient has consented to  Procedure(s) (LRB): PUBO-VAGINAL SLING (N/A) as a surgical intervention .  The patients' history has been reviewed, patient examined, no change in status, stable for surgery.  I have reviewed the patients' chart and labs.  Questions were answered to the patient's satisfaction.     Aaryanna Hyden S

## 2011-08-16 NOTE — Progress Notes (Signed)
Pt has a loose cough. Lungs clear

## 2011-08-16 NOTE — Op Note (Signed)
Preoperative diagnosis: Stress urinary incontinence  Postoperative diagnosis: Same   Procedure: Suburethral retropubic sling  Surgeon Bernestine Amass, M.D.  Anesthesia: Gen.  Indication: The patient has had a long-standing stress urinary incontinence. The patient underwent video urodynamics which showed mild bladder instability. Stress urinary incontinence was demonstrated. We discussed the advantages disadvantages as well as the risks of suburethral sling surgery. We went over success rates as well as complications. We discussed the possibility of postoperative urinary retention as well as possible ongoing stress incontinence. She appeared to understand the risks and benefits of this procedure and full informed consent has been obtained.  Technique and findings: The patient was brought to the operating room where she had successful induction of general anesthesia. She had placement of PAS compression boots and received a perioperative antibiotic. She was placed in lithotomy position with all extremities carefully padded. She was then prepped and draped in the usual manner. Appropriate surgical timeout was performed. A weighted vaginal speculum was used. A Foley catheter was inserted and the bladder was completely drained. The anterior vaginal mucosa at the mid urethral level was infiltrated with lidocaine. A small incision was then made over the mid urethra and the dissection carried out bilaterally until we were able to palpate underneath the retropubic space on both sides. 2 small stab incisions were made in the retro pubic area just to the lateral of the midline. Then utilizing direct digital finger control, the needles were passed from the retropubic incision out the right and left sides of the vaginal incision. Flexible cystoscopy was then performed after removal of Foley catheter. Needle passage appeared to be in good position and there was no evidence of perforation of the bladder. A Foley  catheter was reinserted and the bladder again completely drained. The [place] sling was then attached to the needles and the sling was brought out the retropubic incision bilaterally. A right angle clamp was placed at the mid urethra up behind the sling to assure proper tensioning. The sleeves on the sling were then removed in a standard manner and the redundant sling cut at the level of the skin in the retropubic region. The vaginal incision was copiously irrigated with antibiotic solution. A small piece of Surgicel was placed up for some mild vaginal oozing. The vaginal mucosa was then closed with a running 2-0 Vicryl suture. Vaginal packing utilizing bacitracin ointment was then placed. The retropubic incisions were closed with Dermabond. The Foley catheter was left indwelling to gravity drainage. All sponge and needle counts were correct. The patient was brought to recovery room in stable condition.

## 2011-08-16 NOTE — Anesthesia Preprocedure Evaluation (Signed)
Anesthesia Evaluation  Patient identified by MRN, date of birth, ID band Patient awake    Reviewed: Allergy & Precautions, H&P , NPO status , Patient's Chart, lab work & pertinent test results  Airway Mallampati: II TM Distance: <3 FB Neck ROM: Full    Dental No notable dental hx.    Pulmonary neg pulmonary ROS, sleep apnea ,  breath sounds clear to auscultation  Pulmonary exam normal       Cardiovascular hypertension, Rhythm:Regular Rate:Normal     Neuro/Psych Bipolar Disorder negative neurological ROS     GI/Hepatic Neg liver ROS, GERD-  ,  Endo/Other  Diabetes mellitus-Hypothyroidism Morbid obesity  Renal/GU negative Renal ROS  negative genitourinary   Musculoskeletal negative musculoskeletal ROS (+)   Abdominal   Peds negative pediatric ROS (+)  Hematology negative hematology ROS (+)   Anesthesia Other Findings   Reproductive/Obstetrics negative OB ROS                           Anesthesia Physical Anesthesia Plan  ASA: III  Anesthesia Plan: General   Post-op Pain Management:    Induction: Intravenous  Airway Management Planned: Oral ETT  Additional Equipment:   Intra-op Plan:   Post-operative Plan: Extubation in OR  Informed Consent: I have reviewed the patients History and Physical, chart, labs and discussed the procedure including the risks, benefits and alternatives for the proposed anesthesia with the patient or authorized representative who has indicated his/her understanding and acceptance.   Dental advisory given  Plan Discussed with: CRNA  Anesthesia Plan Comments:         Anesthesia Quick Evaluation

## 2011-08-16 NOTE — Interval H&P Note (Signed)
History and Physical Interval Note:  08/16/2011 9:10 AM  Jasmine [name]  has presented today for surgery, with the diagnosis of Stress Urge Incontinence  The various methods of treatment have been discussed with the patient and family. After consideration of risks, benefits and other options for treatment, the patient has consented to  Procedure(s) (LRB): PUBO-VAGINAL SLING (N/A) as a surgical intervention .  The patients' history has been reviewed, patient examined, no change in status, stable for surgery.  I have reviewed the patients' chart and labs.  Questions were answered to the patient's satisfaction.     [name]

## 2011-08-17 LAB — BASIC METABOLIC PANEL
BUN: 32 mg/dL — ABNORMAL HIGH (ref 6–23)
Creatinine, Ser: 2.08 mg/dL — ABNORMAL HIGH (ref 0.50–1.10)
GFR calc Af Amer: 33 mL/min — ABNORMAL LOW (ref >90)
GFR calc non Af Amer: 29 mL/min — ABNORMAL LOW (ref >90)
Glucose, Bld: 404 mg/dL — ABNORMAL HIGH (ref 70–99)
Potassium: 5.1 mEq/L (ref 3.5–5.1)

## 2011-08-17 LAB — GLUCOSE, CAPILLARY: Glucose-Capillary: 492 mg/dL — ABNORMAL HIGH (ref 70–99)

## 2011-08-17 MED ORDER — HYDROCODONE-ACETAMINOPHEN 5-325 MG PO TABS
1.0000 | ORAL_TABLET | Freq: Four times a day (QID) | ORAL | Status: AC | PRN
Start: 2011-08-17 — End: 2011-08-27

## 2011-08-17 MED ORDER — INSULIN ASPART 100 UNIT/ML ~~LOC~~ SOLN: 15.0000 [IU] | Freq: Once | SUBCUTANEOUS | Status: DC

## 2011-08-17 NOTE — Discharge Summary (Signed)
Physician Discharge Summary  Patient ID: Jasmine [name] MRN: [id number] DOB/AGE: 40-25-1972 40 y.o.  Admit date: 08/16/2011 Discharge date: 08/17/2011  Admission Diagnoses:  Discharge Diagnoses:  Active Problems:  SUI (stress urinary incontinence, female)   Discharged Condition: good  Hospital Course: Patient underwent pubovaginal sling surgery for straightforward stress incontinence. She was kept overnight for observation. Vaginal packing was removed and there was no evidence of any active bleeding. Voiding trial will be performed this morning with discharge if able to urinate successfully.  Consults: None  Significant Diagnostic Studies: None significant  Treatments: surgery: Retropubic suburethral sling procedure on 08/16/2011  Discharge Exam: Blood pressure 99/63, pulse 84, temperature 97.3 F (36.3 C), temperature source Oral, resp. rate 18, height 5' 9.75" (1.772 m), weight 130 kg (286 lb 9.6 oz), SpO2 95.00%. Well-developed well-nourished female in no acute distress. Respiratory effort is normal. Heart is regular rate and rhythm. Abdomen is soft and nontender. Patient reports no significant vaginal bleeding. Extremities are unremarkable.  Disposition: 01-Home or Self Care   Medication List  As of 08/17/2011  8:01 AM   STOP taking these medications         aspirin EC 81 MG tablet         TAKE these medications         acetaminophen 500 MG tablet   Commonly known as: TYLENOL   Take 1,000 mg by mouth every 6 (six) hours as needed. For pain      ALPRAZolam 1 MG tablet   Commonly known as: XANAX   Take 0.5 mg by mouth 3 (three) times daily as needed. for anxiety and sleep      carvedilol 25 MG tablet   Commonly known as: COREG   Take 25 mg by mouth 2 (two) times daily with a meal.      escitalopram 10 MG tablet   Commonly known as: LEXAPRO   Take 10 mg by mouth daily before breakfast.      fish oil-omega-3 fatty acids 1000 MG capsule   Take 1 g by mouth at  bedtime.      folic acid A999333 MCG tablet   Commonly known as: FOLVITE   Take 400 mcg by mouth daily.      hydrochlorothiazide 25 MG tablet   Commonly known as: HYDRODIURIL   Take 25 mg by mouth daily before breakfast.      HYDROcodone-acetaminophen 5-325 MG per tablet   Commonly known as: NORCO   Take 1-2 tablets by mouth every 6 (six) hours as needed for pain.      insulin aspart 100 UNIT/ML injection   Commonly known as: novoLOG   Inject 1.8-2.35 Units into the skin continuous. Basal rate at 9 am-8 pm=2.35 units; 8 pm-9 am= 1.8 units      insulin glargine 100 UNIT/ML injection   Commonly known as: LANTUS   Inject 40 Units into the skin at bedtime.      lamoTRIgine 200 MG tablet   Commonly known as: LAMICTAL   Take 200 mg by mouth at bedtime.      levothyroxine 88 MCG tablet   Commonly known as: SYNTHROID, LEVOTHROID   Take 88 mcg by mouth daily before breakfast.      lisinopril 20 MG tablet   Commonly known as: PRINIVIL,ZESTRIL   Take 20 mg by mouth 2 (two) times daily.      mirtazapine 30 MG tablet   Commonly known as: REMERON   Take 30 mg by mouth at bedtime.  pravastatin 20 MG tablet   Commonly known as: PRAVACHOL   Take 20 mg by mouth at bedtime.      ranitidine 150 MG tablet   Commonly known as: ZANTAC   Take 150 mg by mouth daily as needed. For acid reflux      Vitamin D (Ergocalciferol) 50000 UNITS Caps   Commonly known as: DRISDOL   Take 50,000 Units by mouth every 7 (seven) days. On  WEDNESDAYS      ziprasidone 60 MG capsule   Commonly known as: GEODON   Take 60 mg by mouth at bedtime.           Follow-up Information    Follow up on 09/13/2011. (145pm)          Signed: Dulcy Sida S 08/17/2011, 8:01 AM

## 2011-09-01 ENCOUNTER — Encounter (HOSPITAL_COMMUNITY): Payer: Self-pay | Admitting: Urology

## 2011-11-03 ENCOUNTER — Emergency Department (HOSPITAL_COMMUNITY): Payer: Medicare Other

## 2011-11-03 ENCOUNTER — Inpatient Hospital Stay (HOSPITAL_COMMUNITY)
Admission: EM | Admit: 2011-11-03 | Discharge: 2011-11-07 | DRG: 638 | Disposition: A | Discharge status: Home / Self Care | Payer: Medicare Other | Attending: Internal Medicine | Admitting: Internal Medicine

## 2011-11-03 ENCOUNTER — Encounter (HOSPITAL_COMMUNITY): Payer: Self-pay | Admitting: Oncology

## 2011-11-03 DIAGNOSIS — K219 Gastro-esophageal reflux disease without esophagitis: Secondary | ICD-10-CM | POA: Diagnosis present

## 2011-11-03 DIAGNOSIS — F17200 Nicotine dependence, unspecified, uncomplicated: Secondary | ICD-10-CM | POA: Diagnosis present

## 2011-11-03 DIAGNOSIS — N183 Chronic kidney disease, stage 3 unspecified: Secondary | ICD-10-CM | POA: Diagnosis present

## 2011-11-03 DIAGNOSIS — F319 Bipolar disorder, unspecified: Secondary | ICD-10-CM | POA: Diagnosis present

## 2011-11-03 DIAGNOSIS — E7800 Pure hypercholesterolemia, unspecified: Secondary | ICD-10-CM | POA: Diagnosis present

## 2011-11-03 DIAGNOSIS — E1029 Type 1 diabetes mellitus with other diabetic kidney complication: Secondary | ICD-10-CM

## 2011-11-03 DIAGNOSIS — R197 Diarrhea, unspecified: Secondary | ICD-10-CM | POA: Diagnosis present

## 2011-11-03 DIAGNOSIS — R110 Nausea: Secondary | ICD-10-CM

## 2011-11-03 DIAGNOSIS — G4730 Sleep apnea, unspecified: Secondary | ICD-10-CM | POA: Diagnosis present

## 2011-11-03 DIAGNOSIS — K529 Noninfective gastroenteritis and colitis, unspecified: Secondary | ICD-10-CM

## 2011-11-03 DIAGNOSIS — IMO0002 Reserved for concepts with insufficient information to code with codable children: Secondary | ICD-10-CM

## 2011-11-03 DIAGNOSIS — E875 Hyperkalemia: Secondary | ICD-10-CM | POA: Diagnosis present

## 2011-11-03 DIAGNOSIS — Z9641 Presence of insulin pump (external) (internal): Secondary | ICD-10-CM

## 2011-11-03 DIAGNOSIS — Z794 Long term (current) use of insulin: Secondary | ICD-10-CM

## 2011-11-03 DIAGNOSIS — E1065 Type 1 diabetes mellitus with hyperglycemia: Secondary | ICD-10-CM

## 2011-11-03 DIAGNOSIS — F411 Generalized anxiety disorder: Secondary | ICD-10-CM | POA: Diagnosis present

## 2011-11-03 DIAGNOSIS — R748 Abnormal levels of other serum enzymes: Secondary | ICD-10-CM | POA: Diagnosis present

## 2011-11-03 DIAGNOSIS — N393 Stress incontinence (female) (male): Secondary | ICD-10-CM

## 2011-11-03 DIAGNOSIS — I129 Hypertensive chronic kidney disease with stage 1 through stage 4 chronic kidney disease, or unspecified chronic kidney disease: Secondary | ICD-10-CM | POA: Diagnosis present

## 2011-11-03 DIAGNOSIS — Z720 Tobacco use: Secondary | ICD-10-CM

## 2011-11-03 DIAGNOSIS — E871 Hypo-osmolality and hyponatremia: Secondary | ICD-10-CM

## 2011-11-03 DIAGNOSIS — I10 Essential (primary) hypertension: Secondary | ICD-10-CM

## 2011-11-03 DIAGNOSIS — R5383 Other fatigue: Secondary | ICD-10-CM | POA: Diagnosis present

## 2011-11-03 DIAGNOSIS — N182 Chronic kidney disease, stage 2 (mild): Secondary | ICD-10-CM | POA: Diagnosis present

## 2011-11-03 DIAGNOSIS — K5289 Other specified noninfective gastroenteritis and colitis: Secondary | ICD-10-CM | POA: Diagnosis present

## 2011-11-03 DIAGNOSIS — R1110 Vomiting, unspecified: Secondary | ICD-10-CM | POA: Diagnosis present

## 2011-11-03 DIAGNOSIS — E039 Hypothyroidism, unspecified: Secondary | ICD-10-CM

## 2011-11-03 DIAGNOSIS — D72829 Elevated white blood cell count, unspecified: Secondary | ICD-10-CM | POA: Diagnosis present

## 2011-11-03 DIAGNOSIS — E1010 Type 1 diabetes mellitus with ketoacidosis without coma: Principal | ICD-10-CM

## 2011-11-03 DIAGNOSIS — R5381 Other malaise: Secondary | ICD-10-CM | POA: Diagnosis present

## 2011-11-03 DIAGNOSIS — E1165 Type 2 diabetes mellitus with hyperglycemia: Secondary | ICD-10-CM | POA: Diagnosis not present

## 2011-11-03 HISTORY — DX: Type 1 diabetes mellitus with ketoacidosis without coma: E10.10

## 2011-11-03 HISTORY — DX: Obesity, unspecified: E66.9

## 2011-11-03 HISTORY — DX: Urinary tract infection, site not specified: N39.0

## 2011-11-03 HISTORY — DX: Chronic kidney disease, stage 2 (mild): N18.2

## 2011-11-03 HISTORY — DX: Tobacco use: Z72.0

## 2011-11-03 LAB — URINALYSIS, ROUTINE W REFLEX MICROSCOPIC
Bilirubin Urine: NEGATIVE
Ketones, ur: NEGATIVE mg/dL
Leukocytes, UA: NEGATIVE
Nitrite: NEGATIVE
Protein, ur: 100 mg/dL — AB
Urobilinogen, UA: 0.2 mg/dL (ref 0.0–1.0)

## 2011-11-03 LAB — GLUCOSE, CAPILLARY
Glucose-Capillary: 555 mg/dL (ref 70–99)
Glucose-Capillary: 268 mg/dL — ABNORMAL HIGH (ref 70–99)

## 2011-11-03 LAB — CBC
HCT: 43.7 % (ref 36.0–46.0)
MCHC: 33.2 g/dL (ref 30.0–36.0)
MCV: 91.4 fL (ref 78.0–100.0)
Platelets: 225 10*3/uL (ref 150–400)
RDW: 12.4 % (ref 11.5–15.5)
WBC: 11.5 10*3/uL — ABNORMAL HIGH (ref 4.0–10.5)

## 2011-11-03 LAB — BASIC METABOLIC PANEL
BUN: 43 mg/dL — ABNORMAL HIGH (ref 6–23)
Creatinine, Ser: 1.84 mg/dL — ABNORMAL HIGH (ref 0.50–1.10)
GFR calc Af Amer: 39 mL/min — ABNORMAL LOW (ref >90)
GFR calc non Af Amer: 33 mL/min — ABNORMAL LOW (ref >90)

## 2011-11-03 LAB — HEPATIC FUNCTION PANEL
ALT: 10 U/L (ref 0–35)
AST: 10 U/L (ref 0–37)
Albumin: 3.3 g/dL — ABNORMAL LOW (ref 3.5–5.2)
Alkaline Phosphatase: 119 U/L — ABNORMAL HIGH (ref 39–117)
Total Bilirubin: 0.2 mg/dL — ABNORMAL LOW (ref 0.3–1.2)

## 2011-11-03 LAB — URINE MICROSCOPIC-ADD ON

## 2011-11-03 MED ORDER — INSULIN REGULAR HUMAN 100 UNIT/ML IJ SOLN
10.0000 [IU] | Freq: Once | INTRAMUSCULAR | Status: AC
  Administered 2011-11-03: 10 [IU] via INTRAVENOUS
  Filled 2011-11-03: qty 0.1

## 2011-11-03 MED ORDER — PROMETHAZINE HCL 25 MG/ML IJ SOLN: 6.2500 mg | Freq: Four times a day (QID) | INTRAMUSCULAR | Status: DC | PRN

## 2011-11-03 MED ORDER — INSULIN ASPART 100 UNIT/ML ~~LOC~~ SOLN
0.0000 [IU] | Freq: Three times a day (TID) | SUBCUTANEOUS | Status: DC
  Administered 2011-11-03: 3 [IU] via SUBCUTANEOUS

## 2011-11-03 MED ORDER — INSULIN ASPART 100 UNIT/ML ~~LOC~~ SOLN
0.0000 [IU] | Freq: Every day | SUBCUTANEOUS | Status: DC
  Administered 2011-11-03: 3 [IU] via SUBCUTANEOUS

## 2011-11-03 MED ORDER — SODIUM CHLORIDE 0.9 % IV BOLUS (SEPSIS)
1000.0000 mL | Freq: Once | INTRAVENOUS | Status: AC
  Administered 2011-11-03: 1000 mL via INTRAVENOUS

## 2011-11-03 MED ORDER — CARVEDILOL 12.5 MG PO TABS
25.0000 mg | ORAL_TABLET | Freq: Two times a day (BID) | ORAL | Status: DC
  Administered 2011-11-03 – 2011-11-07 (×8): 25 mg via ORAL
  Filled 2011-11-03 (×3): qty 2
  Filled 2011-11-03: qty 1
  Filled 2011-11-03 (×3): qty 2
  Filled 2011-11-03: qty 1
  Filled 2011-11-03: qty 2

## 2011-11-03 MED ORDER — MIRTAZAPINE 30 MG PO TABS
30.0000 mg | ORAL_TABLET | Freq: Every day | ORAL | Status: DC
  Administered 2011-11-03 – 2011-11-06 (×4): 30 mg via ORAL
  Filled 2011-11-03 (×5): qty 1

## 2011-11-03 MED ORDER — INSULIN GLARGINE 100 UNIT/ML ~~LOC~~ SOLN
15.0000 [IU] | Freq: Two times a day (BID) | SUBCUTANEOUS | Status: DC
  Administered 2011-11-03 (×2): 15 [IU] via SUBCUTANEOUS

## 2011-11-03 MED ORDER — LEVOTHYROXINE SODIUM 88 MCG PO TABS
88.0000 ug | ORAL_TABLET | Freq: Every day | ORAL | Status: DC
  Administered 2011-11-04 – 2011-11-07 (×4): 88 ug via ORAL
  Filled 2011-11-03 (×9): qty 1

## 2011-11-03 MED ORDER — ESCITALOPRAM OXALATE 10 MG PO TABS
10.0000 mg | ORAL_TABLET | Freq: Every day | ORAL | Status: DC
  Administered 2011-11-04 – 2011-11-07 (×4): 10 mg via ORAL
  Filled 2011-11-03 (×5): qty 1

## 2011-11-03 MED ORDER — POTASSIUM CHLORIDE IN NACL 20-0.9 MEQ/L-% IV SOLN
INTRAVENOUS | Status: DC
  Administered 2011-11-03 – 2011-11-04 (×2): via INTRAVENOUS

## 2011-11-03 MED ORDER — LAMOTRIGINE 200 MG PO TABS
200.0000 mg | ORAL_TABLET | Freq: Every day | ORAL | Status: DC
  Administered 2011-11-03 – 2011-11-06 (×4): 200 mg via ORAL
  Filled 2011-11-03 (×5): qty 1

## 2011-11-03 MED ORDER — SODIUM CHLORIDE 0.9 % IV BOLUS (SEPSIS)
500.0000 mL | Freq: Once | INTRAVENOUS | Status: AC
  Administered 2011-11-03: 500 mL via INTRAVENOUS

## 2011-11-03 MED ORDER — ONDANSETRON HCL 4 MG/2ML IJ SOLN
4.0000 mg | Freq: Once | INTRAMUSCULAR | Status: AC
  Administered 2011-11-03: 4 mg via INTRAVENOUS
  Filled 2011-11-03: qty 2

## 2011-11-03 MED ORDER — ONDANSETRON HCL 4 MG/2ML IJ SOLN
4.0000 mg | Freq: Four times a day (QID) | INTRAMUSCULAR | Status: DC
  Administered 2011-11-03 – 2011-11-04 (×3): 4 mg via INTRAVENOUS
  Filled 2011-11-03: qty 4
  Filled 2011-11-03 (×2): qty 2

## 2011-11-03 MED ORDER — PANTOPRAZOLE SODIUM 40 MG IV SOLR
40.0000 mg | INTRAVENOUS | Status: DC
  Administered 2011-11-03: 40 mg via INTRAVENOUS
  Filled 2011-11-03: qty 40

## 2011-11-03 MED ORDER — ENOXAPARIN SODIUM 40 MG/0.4ML ~~LOC~~ SOLN
40.0000 mg | SUBCUTANEOUS | Status: DC
  Administered 2011-11-03: 40 mg via SUBCUTANEOUS
  Filled 2011-11-03: qty 0.4

## 2011-11-03 MED ORDER — LISINOPRIL 10 MG PO TABS
20.0000 mg | ORAL_TABLET | Freq: Two times a day (BID) | ORAL | Status: DC
  Administered 2011-11-03 (×2): 20 mg via ORAL
  Filled 2011-11-03 (×2): qty 2

## 2011-11-03 MED ORDER — FENTANYL CITRATE 0.05 MG/ML IJ SOLN: 50.0000 ug | Freq: Once | INTRAMUSCULAR | Status: DC

## 2011-11-03 NOTE — ED Notes (Signed)
Per ems report, pt contacted 911 at 0500 this am due to nausea, diarrhea, elevated blood sugar, weakness.  EMS reported their initial CBG read "high," and after a 500cc NS bolus, pt's CBG is reported to be 536.  Initial O2 saturation was 91% and increased to 97% on 4lpm.  Pt is on an insulin pump at home - located to right lower abdomen (site unremarkable) and basal rate is set at 1.4 units/hr.

## 2011-11-03 NOTE — ED Notes (Signed)
Patient provided a urine without having to do an In & Out Cath.

## 2011-11-03 NOTE — ED Provider Notes (Signed)
History    Scribed for [employer]. Alvino Chapel, MD, the patient was seen in room APA08/APA08. This chart was scribed by Lyndee Hensen.  CSN: [id number]  Arrival date & time 11/03/11  0845   First MD Initiated Contact with Patient 11/03/11 [phone]      Chief Complaint  Patient presents with  . Hyperglycemia  . Diarrhea    (Consider location/radiation/quality/duration/timing/severity/associated sxs/prior treatment) HPI Jasmine [name]. Alvino Chapel, MD entered patient's room at [place] is a 41 y.o. female with Diabetes presents to the Emergency Department for hyperglycemia.  Patient states her machine did not register her blood sugar this AM.  Patient reports urinary frequency, nausea, diarrhea and fever.    Symptoms have not changed.  Denies abdominal pain, vomiting, cough, SOB and headaches.  Patient states her blood sugar is not controlled by medication.   Reports last normal blood sugar was a couple days ago.  Patient has insulin pump.  Patient called EMS who reported patients CBG was 536 after receiving IV bolus.     Patient with history of HTN, renal insufficiency and thyroid disease.   PCP Bing Matter, PA, PA-C    Past Medical History  Diagnosis Date  . Hypertension   . Diabetes mellitus   . Bipolar disorder   . CKD (chronic kidney disease), stage II   . Elevated cholesterol   . GERD (gastroesophageal reflux disease)   . Stress incontinence   . Hypothyroidism   . Sleep apnea     HAS C -PAP / DOES NOT USE  . Anxiety   . Depression   . UTI (lower urinary tract infection)     Past Surgical History  Procedure Date  . Ovary removed   . Nasal fracture surgery   . Uterine fibroid surgery 2001  . Pubovaginal sling 08/16/2011    Procedure: [name];  Surgeon: Bernestine Amass, MD;  Location: [facility];  Service: Urology;  Laterality: N/A;           No family history on file.  History  Substance Use Topics  . Smoking status: Current Everyday Smoker -- 1.0  packs/day  . Smokeless tobacco: Not on file  . Alcohol Use: No    OB History    Grav Para Term Preterm Abortions TAB SAB Ect Mult Living                  Review of Systems  Constitutional: Positive for fever.  HENT: Negative.   Eyes: Negative.   Respiratory: Negative.  Negative for cough and shortness of breath.   Cardiovascular: Negative.   Gastrointestinal: Positive for nausea and diarrhea. Negative for vomiting and abdominal pain.  Genitourinary: Positive for frequency.  Musculoskeletal: Negative.   Skin: Negative.   Neurological: Negative.  Negative for headaches.  Hematological: Negative.   Psychiatric/Behavioral: Negative.     Allergies  Review of patient's allergies indicates no known allergies.  Home Medications   Current Outpatient Rx  Name Route Sig Dispense Refill  . ACETAMINOPHEN 500 MG PO TABS Oral Take 1,000 mg by mouth every 6 (six) hours as needed. For pain    . CARVEDILOL 25 MG PO TABS Oral Take 25 mg by mouth 2 (two) times daily with a meal.     . ESCITALOPRAM OXALATE 10 MG PO TABS Oral Take 10 mg by mouth daily before breakfast.     . OMEGA-3 FATTY ACIDS 1000 MG PO CAPS Oral Take 1 g by mouth at bedtime.     Marland Kitchen  HYDROCHLOROTHIAZIDE 25 MG PO TABS Oral Take 25 mg by mouth daily before breakfast.     . INSULIN ASPART 100 UNIT/ML  SOLN Subcutaneous Inject 1.8-2.35 Units into the skin continuous. Basal rate at 9 am-8 pm=2.35 units; 8 pm-9 am= 1.8 units    . LAMOTRIGINE 200 MG PO TABS Oral Take 200 mg by mouth at bedtime.     Marland Kitchen LEVOTHYROXINE SODIUM 88 MCG PO TABS Oral Take 88 mcg by mouth daily before breakfast.     . LISINOPRIL 20 MG PO TABS Oral Take 20 mg by mouth 2 (two) times daily.     Marland Kitchen MIRTAZAPINE 30 MG PO TABS Oral Take 30 mg by mouth at bedtime.     Marland Kitchen PRAVASTATIN SODIUM 20 MG PO TABS Oral Take 20 mg by mouth at bedtime.      BP 179/99  Pulse 73  Temp(Src) 98.5 F (36.9 C) (Oral)  Resp 16  Ht 5\' 9"  (1.753 m)  Wt 270 lb (122.471 kg)  BMI 39.87  kg/m2  SpO2 91%  Physical Exam  Constitutional: She is oriented to person, place, and time. She appears well-developed.       Obese, Patient resting with eyes open.  HENT:  Head: Normocephalic and atraumatic.  Eyes: EOM are normal. Pupils are equal, round, and reactive to light.  Neck: Normal range of motion.  Cardiovascular: Normal rate and regular rhythm.   Pulmonary/Chest: Effort normal and breath sounds normal. No respiratory distress.  Abdominal: Soft. There is no tenderness.       Insulin pump on right abdomen   Musculoskeletal: Normal range of motion.       Good dp pulse  Neurological: She is alert and oriented to person, place, and time.  Skin: Skin is warm and dry.  Psychiatric: She has a normal mood and affect. Her speech is normal and behavior is normal.    ED Course  Procedures (including critical care time)   DIAGNOSTIC STUDIES: Oxygen Saturation is 98% on nasal cannula, normal by my interpretation.     COORDINATION OF CARE: 0907 Physical exam complete.  Will order blood work and start IV fluids.   0915 Zofran and Sublimaze ordered.    1100  Novolin R, Humulin R and IV fluids ordered.     LABS / RADIOLOGY:   Labs Reviewed  GLUCOSE, CAPILLARY - Abnormal; Notable for the following:    Glucose-Capillary 555 (*)    All other components within normal limits  CBC - Abnormal; Notable for the following:    WBC 11.5 (*)    All other components within normal limits  BASIC METABOLIC PANEL - Abnormal; Notable for the following:    Sodium 131 (*)    Glucose, Bld 502 (*)    BUN 43 (*)    Creatinine, Ser 1.84 (*)    GFR calc non Af Amer 33 (*)    GFR calc Af Amer 39 (*)    All other components within normal limits  URINALYSIS, ROUTINE W REFLEX MICROSCOPIC - Abnormal; Notable for the following:    Glucose, UA >1000 (*)    Hgb urine dipstick SMALL (*)    Protein, ur 100 (*)    All other components within normal limits  HEPATIC FUNCTION PANEL - Abnormal; Notable  for the following:    Albumin 3.3 (*)    Alkaline Phosphatase 119 (*)    Total Bilirubin 0.2 (*)    All other components within normal limits  LIPASE, BLOOD - Abnormal; Notable for  the following:    Lipase 68 (*)    All other components within normal limits  GLUCOSE, CAPILLARY - Abnormal; Notable for the following:    Glucose-Capillary 272 (*)    All other components within normal limits  URINE MICROSCOPIC-ADD ON - Abnormal; Notable for the following:    Squamous Epithelial / LPF FEW (*)    All other components within normal limits  POCT PREGNANCY, URINE  CBC  CLOSTRIDIUM DIFFICILE BY PCR  TSH  HEMOGLOBIN A1C   Dg Chest Port 1 View  11/03/2011  *RADIOLOGY REPORT*  Clinical Data: 41 year old female with hyperglycemia.  Diabetes, hypertension.  PORTABLE CHEST - 1 VIEW  Comparison: 06/17/2011 and earlier.  Findings: Portable upright AP view at 0937 hours.  Chronically low lung volumes.  Cardiac size and mediastinal contours are within normal limits.  Visualized tracheal air column is within normal limits.  No pneumothorax, pleural effusion or consolidation. Mildly increased above baseline pulmonary vascular congestion, but no overt edema.  IMPRESSION: Chronically low lung volumes.  Mild vascular congestion without overt edema.  Original Report Authenticated By: Randall An, M.D.         MDM  Patient with elevated CBG.. Nausea vomiting. Abdominal pain. She has low oxygen saturation negative x-ray. She has a history of sleep apnea and she states her oxygen level always runs low. She's been able to tolerate orals here. Her lipase is slightly elevated. Patient be admitted to medicine.        MEDICATIONS GIVEN IN THE E.D. Scheduled Meds:    . carvedilol  25 mg Oral BID WC  . enoxaparin  40 mg Subcutaneous Q24H  . escitalopram  10 mg Oral QAC breakfast  . insulin aspart  0-15 Units Subcutaneous TID WC  . insulin aspart  0-5 Units Subcutaneous QHS  . insulin glargine  15 Units  Subcutaneous BID  . insulin regular  10 Units Intravenous Once  . lamoTRIgine  200 mg Oral QHS  . levothyroxine  88 mcg Oral QAC breakfast  . lisinopril  20 mg Oral BID  . mirtazapine  30 mg Oral QHS  . ondansetron  4 mg Intravenous Once  . ondansetron (ZOFRAN) IV  4 mg Intravenous Q6H  . pantoprazole (PROTONIX) IV  40 mg Intravenous Q24H  . sodium chloride  1,000 mL Intravenous Once  . sodium chloride  500 mL Intravenous Once  . DISCONTD: fentaNYL  50 mcg Intravenous Once   Continuous Infusions:    . 0.9 % NaCl with KCl 20 mEq / L         IMPRESSION: 1. Nausea   2. DM (diabetes mellitus), type 1, uncontrolled   3. CKD (chronic kidney disease), stage II   4. Elevated lipase      NEW MEDICATIONS: New Prescriptions   No medications on file       I personally performed the services described in this documentation, which was scribed in my presence. The recorded information has been reviewed and considered.    Scribe          [employer]. Alvino Chapel, MD 11/03/11 [phone]

## 2011-11-03 NOTE — ED Notes (Signed)
Pt states she does not feel like she can collect a urine specimen at this time and does not want an in and out. Pt urinated earlier along with BM and specimen was no good. Pt aware a urine is still needed. Pt remains drowsy but easily aroused with verbal stimuli. Able to drink po fluids.

## 2011-11-03 NOTE — ED Notes (Signed)
Patient is resting comfortably. Family was at her bedside when I tried to get a urine.

## 2011-11-03 NOTE — ED Notes (Signed)
Pt denies pain. States nausea and diarrhea since last night. Denies vomiting. States she is unsure if insulin pump is working. NAD. Pt is very drowsy.

## 2011-11-03 NOTE — H&P (Signed)
Jasmine [name] MRN: [id number] DOB/AGE: 1970-11-08 41 y.o. Primary Care Physician:KAPLAN,KRISTEN, PA, PA-C Admit date: 11/03/2011 Chief Complaint: Elevated blood sugars, nausea, diarrhea, and weakness. HPI: The patient is a 41 year old woman with a history of type 1 diabetes mellitus, bipolar disorder, chronic kidney disease, and obstructive sleep apnea, who presents to the emergency department with a chief complaint of uncontrolled diabetes, nausea, diarrhea, and generalized weakness. Her symptoms started yesterday. She had been having elevated blood sugars for most of the day. She increased the insulin rate and basal rate on her insulin pump, but it did not appear to be improving her blood sugars. She had associated nausea but no vomiting. She had greater than 5 but less than 10 loose bowel movements yesterday and extending on into early this morning. She denies any recent antibiotics. She denies any recent travel. She does eat out. She denies abdominal pain, pain with urination, bright red blood per rectum, or black tarry stools. She has had subjective fever and chills. She denies chest pain and shortness of breath. At approximately 4 AM this morning, she woke up feeling weak. She checked her blood sugar and it read high again. She called EMS. They reported that her blood sugar was 536 after she received IV fluid bolus.  In the emergency department, she is hypertensive with a blood pressure of 159/99 and afebrile. Her lab data are significant for a venous glucose of 502, sodium of 131, lipase of 68, and WBC of 11.5. Her urine pregnancy test is negative. Her urinalysis revealed 3-6 WBCs, 100 protein, and greater than 1000 glucose. She is being admitted for further evaluation and management.    Past Medical History  Diagnosis Date  . Hypertension   . Diabetes mellitus   . Bipolar disorder   . CKD (chronic kidney disease), stage II   . Elevated cholesterol   . GERD (gastroesophageal reflux disease)    . Stress incontinence   . Hypothyroidism   . Sleep apnea     HAS C -PAP / DOES NOT USE  . Anxiety   . Depression   . UTI (lower urinary tract infection)   . Tobacco abuse     Past Surgical History  Procedure Date  . Ovary removed   . Nasal fracture surgery   . Uterine fibroid surgery 2001  . Pubovaginal sling 08/16/2011    Procedure: [name];  Surgeon: Bernestine Amass, MD;  Location: [facility];  Service: Urology;  Laterality: N/A;           Prior to Admission medications   Medication Sig Start Date End Date Taking? Authorizing Provider  acetaminophen (TYLENOL) 500 MG tablet Take 1,000 mg by mouth every 6 (six) hours as needed. For pain   Yes Historical Provider, MD  carvedilol (COREG) 25 MG tablet Take 25 mg by mouth 2 (two) times daily with a meal.    Yes Historical Provider, MD  escitalopram (LEXAPRO) 10 MG tablet Take 10 mg by mouth daily before breakfast.    Yes Historical Provider, MD  fish oil-omega-3 fatty acids 1000 MG capsule Take 1 g by mouth at bedtime.    Yes Historical Provider, MD  hydrochlorothiazide 25 MG tablet Take 25 mg by mouth daily before breakfast.    Yes Historical Provider, MD  insulin aspart (NOVOLOG) 100 UNIT/ML injection Inject 1.8-2.35 Units into the skin continuous. Basal rate at 9 am-8 pm=2.35 units; 8 pm-9 am= 1.8 units   Yes Historical Provider, MD  lamoTRIgine (LAMICTAL) 200 MG tablet  Take 200 mg by mouth at bedtime.    Yes Historical Provider, MD  levothyroxine (SYNTHROID, LEVOTHROID) 88 MCG tablet Take 88 mcg by mouth daily before breakfast.    Yes Historical Provider, MD  lisinopril (PRINIVIL,ZESTRIL) 20 MG tablet Take 20 mg by mouth 2 (two) times daily.    Yes Historical Provider, MD  mirtazapine (REMERON) 30 MG tablet Take 30 mg by mouth at bedtime.    Yes Historical Provider, MD  pravastatin (PRAVACHOL) 20 MG tablet Take 20 mg by mouth at bedtime.   Yes Historical Provider, MD    Allergies: No Known Allergies  Family history:  Her father is 51 years of age and has diabetes. Her mother died of septic shock.  Social History: She is divorced. She has no children. She lives with her father in [place]. She is disabled from bipolar disorder. She smokes one pack of cigarettes per day. She denies alcohol and illicit drug use.         ROS: As above in history present illness. Otherwise, review of systems is negative.  PHYSICAL EXAM: Blood pressure 179/99, pulse 73, temperature 98.5 F (36.9 C), temperature source Oral, resp. rate 16, height 5\' 9"  (1.753 m), weight 122.471 kg (270 lb), SpO2 91.00%. General: Obese 41 year old Caucasian woman, lying in bed, in no acute distress, however she does appear ill. HEENT: Head is normocephalic, nontraumatic. Pupils are equal, round, and reactive to light. Extraocular movements are intact. Conjunctivae are clear. Sclerae are white. Oropharynx reveals dry mucous membranes. Poor dentition. No posterior exudates or erythema. Neck: Supple, no adenopathy, no thyromegaly, no JVD. Lungs: Decreased breath sounds in the bases, otherwise clear. Heart: S1, S2, no murmurs rubs or gallops. Abdomen: Obese, positive bowel sounds, soft, nontender, nondistended. Insulin pump port located at the right lower quadrant without erythema. Extremities: Pedal pulses palpable. No pedal edema. Neurologic/psychiatric: She is alert and oriented x3. Cranial nerves II through XII are intact. She appears withdrawn. Her speech is clear.   Basic Metabolic Panel:  Basename 11/03/11 0904  NA 131*  K 4.4  CL 97  CO2 25  GLUCOSE 502*  BUN 43*  CREATININE 1.84*  CALCIUM 9.9  MG --  PHOS --   Liver Function Tests:  [facility] 11/03/11 0904  AST 10  ALT 10  ALKPHOS 119*  BILITOT 0.2*  PROT 7.0  ALBUMIN 3.3*    Basename 11/03/11 0904  LIPASE 68*  AMYLASE --   No results found for this basename: AMMONIA:2 in the last 72 hours CBC:  Basename 11/03/11 0904  WBC 11.5*  NEUTROABS --  HGB 14.5    HCT 43.7  MCV 91.4  PLT 225   Cardiac Enzymes: No results found for this basename: CKTOTAL:3,CKMB:3,CKMBINDEX:3,TROPONINI:3 in the last 72 hours BNP: No results found for this basename: PROBNP:3 in the last 72 hours D-Dimer: No results found for this basename: DDIMER:2 in the last 72 hours CBG:  Basename 11/03/11 1157 11/03/11 0848  GLUCAP 272* 555*   Hemoglobin A1C: No results found for this basename: HGBA1C in the last 72 hours Fasting Lipid Panel: No results found for this basename: CHOL,HDL,LDLCALC,TRIG,CHOLHDL,LDLDIRECT in the last 72 hours Thyroid Function Tests: No results found for this basename: TSH,T4TOTAL,FREET4,T3FREE,THYROIDAB in the last 72 hours Anemia Panel: No results found for this basename: VITAMINB12,FOLATE,FERRITIN,TIBC,IRON,RETICCTPCT in the last 72 hours Coagulation: No results found for this basename: LABPROT:2,INR:2 in the last 72 hours Urine Drug Screen: Drugs of Abuse     Component Value Date/Time   LABOPIA NONE DETECTED 05/05/2010  0011   COCAINSCRNUR NONE DETECTED 05/05/2010 0011   LABBENZ POSITIVE* 05/05/2010 0011   AMPHETMU NONE DETECTED 05/05/2010 0011   THCU NONE DETECTED 05/05/2010 0011   LABBARB  Value: NONE DETECTED        DRUG SCREEN FOR MEDICAL PURPOSES ONLY.  IF CONFIRMATION IS NEEDED FOR ANY PURPOSE, NOTIFY LAB WITHIN 5 DAYS.        LOWEST DETECTABLE LIMITS FOR URINE DRUG SCREEN Drug Class       Cutoff (ng/mL) Amphetamine      1000 Barbiturate      200 Benzodiazepine   [id number] Tricyclics       [id number] Opiates          300 Cocaine          300 THC              50 05/05/2010 0011    Alcohol Level: No results found for this basename: ETH:2 in the last 72 hours Urinalysis:  Basename 11/03/11 1319  COLORURINE YELLOW  LABSPEC 1.015  PHURINE 7.0  GLUCOSEU >1000*  HGBUR SMALL*  BILIRUBINUR NEGATIVE  KETONESUR NEGATIVE  PROTEINUR 100*  UROBILINOGEN 0.2  NITRITE NEGATIVE  LEUKOCYTESUR NEGATIVE   Misc. Labs:   No results found for this or  any previous visit (from the past 240 hour(s)).   Results for orders placed during the hospital encounter of 11/03/11 (from the past 48 hour(s))  GLUCOSE, CAPILLARY     Status: Abnormal   Collection Time   11/03/11  8:48 AM      Component Value Range Comment   Glucose-Capillary 555 (*) 70 - 99 (mg/dL)   CBC     Status: Abnormal   Collection Time   11/03/11  9:04 AM      Component Value Range Comment   WBC 11.5 (*) 4.0 - 10.5 (K/uL)    RBC 4.78  3.87 - 5.11 (MIL/uL)    Hemoglobin 14.5  12.0 - 15.0 (g/dL)    HCT 43.7  36.0 - 46.0 (%)    MCV 91.4  78.0 - 100.0 (fL)    MCH 30.3  26.0 - 34.0 (pg)    MCHC 33.2  30.0 - 36.0 (g/dL)    RDW 12.4  11.5 - 15.5 (%)    Platelets 225  150 - 400 (K/uL)   BASIC METABOLIC PANEL     Status: Abnormal   Collection Time   11/03/11  9:04 AM      Component Value Range Comment   Sodium 131 (*) 135 - 145 (mEq/L)    Potassium 4.4  3.5 - 5.1 (mEq/L)    Chloride 97  96 - 112 (mEq/L)    CO2 25  19 - 32 (mEq/L)    Glucose, Bld 502 (*) 70 - 99 (mg/dL)    BUN 43 (*) 6 - 23 (mg/dL)    Creatinine, Ser 1.84 (*) 0.50 - 1.10 (mg/dL)    Calcium 9.9  8.4 - 10.5 (mg/dL)    GFR calc non Af Amer 33 (*) >90 (mL/min)    GFR calc Af Amer 39 (*) >90 (mL/min)   HEPATIC FUNCTION PANEL     Status: Abnormal   Collection Time   11/03/11  9:04 AM      Component Value Range Comment   Total Protein 7.0  6.0 - 8.3 (g/dL)    Albumin 3.3 (*) 3.5 - 5.2 (g/dL)    AST 10  0 - 37 (U/L)    ALT 10  0 - 35 (  U/L)    Alkaline Phosphatase 119 (*) 39 - 117 (U/L)    Total Bilirubin 0.2 (*) 0.3 - 1.2 (mg/dL)    Bilirubin, Direct <0.1  0.0 - 0.3 (mg/dL)    Indirect Bilirubin NOT CALCULATED  0.3 - 0.9 (mg/dL)   LIPASE, BLOOD     Status: Abnormal   Collection Time   11/03/11  9:04 AM      Component Value Range Comment   Lipase 68 (*) 11 - 59 (U/L)   GLUCOSE, CAPILLARY     Status: Abnormal   Collection Time   11/03/11 11:57 AM      Component Value Range Comment   Glucose-Capillary 272 (*)  70 - 99 (mg/dL)   URINALYSIS, ROUTINE W REFLEX MICROSCOPIC     Status: Abnormal   Collection Time   11/03/11  1:19 PM      Component Value Range Comment   Color, Urine YELLOW  YELLOW     APPearance CLEAR  CLEAR     Specific Gravity, Urine 1.015  1.005 - 1.030     pH 7.0  5.0 - 8.0     Glucose, UA >1000 (*) NEGATIVE (mg/dL)    Hgb urine dipstick SMALL (*) NEGATIVE     Bilirubin Urine NEGATIVE  NEGATIVE     Ketones, ur NEGATIVE  NEGATIVE (mg/dL)    Protein, ur 100 (*) NEGATIVE (mg/dL)    Urobilinogen, UA 0.2  0.0 - 1.0 (mg/dL)    Nitrite NEGATIVE  NEGATIVE     Leukocytes, UA NEGATIVE  NEGATIVE    URINE MICROSCOPIC-ADD ON     Status: Abnormal   Collection Time   11/03/11  1:19 PM      Component Value Range Comment   Squamous Epithelial / LPF FEW (*) RARE     WBC, UA 3-6  <3 (WBC/hpf)   POCT PREGNANCY, URINE     Status: Normal   Collection Time   11/03/11  1:21 PM      Component Value Range Comment   Preg Test, Ur NEGATIVE  NEGATIVE      Dg Chest Port 1 View  11/03/2011  *RADIOLOGY REPORT*  Clinical Data: 41 year old female with hyperglycemia.  Diabetes, hypertension.  PORTABLE CHEST - 1 VIEW  Comparison: 06/17/2011 and earlier.  Findings: Portable upright AP view at 0937 hours.  Chronically low lung volumes.  Cardiac size and mediastinal contours are within normal limits.  Visualized tracheal air column is within normal limits.  No pneumothorax, pleural effusion or consolidation. Mildly increased above baseline pulmonary vascular congestion, but no overt edema.  IMPRESSION: Chronically low lung volumes.  Mild vascular congestion without overt edema.  Original Report Authenticated By: Randall An, M.D.    Impression:  Active Problems:  Diarrhea  Nausea  DM (diabetes mellitus), type 1, uncontrolled  Hyponatremia  CKD (chronic kidney disease), stage II  Elevated lipase  Hypothyroidism  HTN (hypertension), malignant  Tobacco abuse   1. Type 1 diabetes mellitus, uncontrolled  with venous glucose above 500. This may be secondary to an insulin pump malfunction. Historically, she has uncontrolled diabetes. Her anion gap is 9 and her CO2 is within normal limits at 25, and therefore she is not in DKA.  2. Nausea and diarrhea. This could be secondary to an gastroenteritis. Her lipase is elevated, but modestly so. Her abdomen is benign on exam so I doubt that this is acute pancreatitis.  3. Hyponatremia, secondary to dehydration and hyperglycemia.  4. Malignant hypertension. She is treated chronically with  carvedilol and lisinopril.  5. Stage II chronic kidney disease. Her creatinine is about at baseline. She is treated chronically with an ACE inhibitor. Her renal function will be followed. Next  6. Tobacco abuse. The patient was advised to stop smoking.   Plan: 1. The patient was given a bolus of IV fluids and IV insulin in the emergency department. 2. We'll continue treatment with Lantus and sliding-scale NovoLog. Asked the patient to dislodge the insulin pump. We'll see if someone can investigate the functioning of the pump. 3. We'll continue carvedilol and lisinopril for treatment of malignant hypertension. 4. IV fluid hydration to treat dehydration. 5. Schedule Zofran for nausea with as needed Phenergan. Will also start IV Protonix. 6. We'll order C. difficile PCR to rule out colitis. 7. Tobacco cessation counseling.     Derrin Currey 11/03/2011, 2:58 PM

## 2011-11-04 ENCOUNTER — Encounter (HOSPITAL_COMMUNITY): Payer: Self-pay | Admitting: Internal Medicine

## 2011-11-04 DIAGNOSIS — E1029 Type 1 diabetes mellitus with other diabetic kidney complication: Secondary | ICD-10-CM

## 2011-11-04 DIAGNOSIS — E1069 Type 1 diabetes mellitus with other specified complication: Secondary | ICD-10-CM

## 2011-11-04 DIAGNOSIS — R110 Nausea: Secondary | ICD-10-CM

## 2011-11-04 DIAGNOSIS — E1010 Type 1 diabetes mellitus with ketoacidosis without coma: Secondary | ICD-10-CM

## 2011-11-04 DIAGNOSIS — E1065 Type 1 diabetes mellitus with hyperglycemia: Secondary | ICD-10-CM

## 2011-11-04 DIAGNOSIS — E1165 Type 2 diabetes mellitus with hyperglycemia: Secondary | ICD-10-CM | POA: Diagnosis not present

## 2011-11-04 DIAGNOSIS — E871 Hypo-osmolality and hyponatremia: Secondary | ICD-10-CM

## 2011-11-04 HISTORY — DX: Type 1 diabetes mellitus with ketoacidosis without coma: E10.10

## 2011-11-04 LAB — BASIC METABOLIC PANEL
BUN: 30 mg/dL — ABNORMAL HIGH (ref 6–23)
BUN: 31 mg/dL — ABNORMAL HIGH (ref 6–23)
BUN: 34 mg/dL — ABNORMAL HIGH (ref 6–23)
CO2: 14 mEq/L — ABNORMAL LOW (ref 19–32)
CO2: 16 mEq/L — ABNORMAL LOW (ref 19–32)
CO2: 17 mEq/L — ABNORMAL LOW (ref 19–32)
CO2: 21 mEq/L (ref 19–32)
Calcium: 8.7 mg/dL (ref 8.4–10.5)
Calcium: 8.8 mg/dL (ref 8.4–10.5)
Chloride: 94 mEq/L — ABNORMAL LOW (ref 96–112)
Chloride: 94 mEq/L — ABNORMAL LOW (ref 96–112)
Chloride: 96 mEq/L (ref 96–112)
Chloride: 98 mEq/L (ref 96–112)
Chloride: 103 mEq/L (ref 96–112)
Creatinine, Ser: 1.69 mg/dL — ABNORMAL HIGH (ref 0.50–1.10)
Creatinine, Ser: 1.78 mg/dL — ABNORMAL HIGH (ref 0.50–1.10)
GFR calc Af Amer: 46 mL/min — ABNORMAL LOW (ref >90)
GFR calc non Af Amer: 32 mL/min — ABNORMAL LOW (ref >90)
Glucose, Bld: 562 mg/dL (ref 70–99)
Glucose, Bld: 465 mg/dL — ABNORMAL HIGH (ref 70–99)
Glucose, Bld: 365 mg/dL — ABNORMAL HIGH (ref 70–99)
Glucose, Bld: 137 mg/dL — ABNORMAL HIGH (ref 70–99)
Potassium: 5.6 mEq/L — ABNORMAL HIGH (ref 3.5–5.1)
Potassium: 5.1 mEq/L (ref 3.5–5.1)
Potassium: 4.3 mEq/L (ref 3.5–5.1)
Sodium: 128 mEq/L — ABNORMAL LOW (ref 135–145)
Sodium: 130 mEq/L — ABNORMAL LOW (ref 135–145)
Sodium: 130 mEq/L — ABNORMAL LOW (ref 135–145)

## 2011-11-04 LAB — GLUCOSE, CAPILLARY
Glucose-Capillary: 142 mg/dL — ABNORMAL HIGH (ref 70–99)
Glucose-Capillary: 156 mg/dL — ABNORMAL HIGH (ref 70–99)
Glucose-Capillary: 208 mg/dL — ABNORMAL HIGH (ref 70–99)
Glucose-Capillary: 273 mg/dL — ABNORMAL HIGH (ref 70–99)
Glucose-Capillary: 283 mg/dL — ABNORMAL HIGH (ref 70–99)
Glucose-Capillary: 436 mg/dL — ABNORMAL HIGH (ref 70–99)
Glucose-Capillary: 567 mg/dL (ref 70–99)

## 2011-11-04 LAB — CBC
HCT: 43.2 % (ref 36.0–46.0)
Hemoglobin: 14.6 g/dL (ref 12.0–15.0)
MCH: 30.7 pg (ref 26.0–34.0)
MCV: 90.8 fL (ref 78.0–100.0)
RBC: 4.76 MIL/uL (ref 3.87–5.11)

## 2011-11-04 LAB — COMPREHENSIVE METABOLIC PANEL
ALT: 8 U/L (ref 0–35)
AST: 9 U/L (ref 0–37)
Albumin: 3.2 g/dL — ABNORMAL LOW (ref 3.5–5.2)
Calcium: 9.1 mg/dL (ref 8.4–10.5)
Creatinine, Ser: 1.55 mg/dL — ABNORMAL HIGH (ref 0.50–1.10)
Sodium: 126 mEq/L — ABNORMAL LOW (ref 135–145)

## 2011-11-04 LAB — HEMOGLOBIN A1C
Hgb A1c MFr Bld: 10.8 % — ABNORMAL HIGH (ref <5.7)
Mean Plasma Glucose: 263 mg/dL — ABNORMAL HIGH (ref <117)

## 2011-11-04 LAB — MRSA PCR SCREENING: MRSA by PCR: NEGATIVE

## 2011-11-04 MED ORDER — HEPARIN SODIUM (PORCINE) 5000 UNIT/ML IJ SOLN
5000.0000 [IU] | Freq: Three times a day (TID) | INTRAMUSCULAR | Status: DC
  Administered 2011-11-04 – 2011-11-07 (×10): 5000 [IU] via SUBCUTANEOUS
  Filled 2011-11-04 (×10): qty 1

## 2011-11-04 MED ORDER — POTASSIUM CHLORIDE 10 MEQ/100ML IV SOLN
10.0000 meq | INTRAVENOUS | Status: AC
  Administered 2011-11-04 – 2011-11-05 (×3): 10 meq via INTRAVENOUS
  Filled 2011-11-04: qty 100

## 2011-11-04 MED ORDER — DEXTROSE-NACL 5-0.45 % IV SOLN
INTRAVENOUS | Status: DC
  Administered 2011-11-04: 125 mL via INTRAVENOUS
  Administered 2011-11-05: 1000 mL via INTRAVENOUS

## 2011-11-04 MED ORDER — POTASSIUM CHLORIDE 10 MEQ/100ML IV SOLN
10.0000 meq | INTRAVENOUS | Status: AC
Start: 2011-11-04 — End: 2011-11-04
  Administered 2011-11-04 (×3): 10 meq via INTRAVENOUS
  Filled 2011-11-04: qty 300

## 2011-11-04 MED ORDER — DEXTROSE 50 % IV SOLN: 25.0000 mL | INTRAVENOUS | Status: DC | PRN

## 2011-11-04 MED ORDER — SODIUM CHLORIDE 0.9 % IV SOLN
INTRAVENOUS | Status: DC
  Administered 2011-11-04: 08:00:00 via INTRAVENOUS
  Administered 2011-11-04: 150 mL via INTRAVENOUS
  Administered 2011-11-05: 22:00:00 via INTRAVENOUS
  Administered 2011-11-05: 70 mL/h via INTRAVENOUS

## 2011-11-04 MED ORDER — HYDRALAZINE HCL 20 MG/ML IJ SOLN: 10.0000 mg | Freq: Four times a day (QID) | INTRAMUSCULAR | Status: DC | PRN

## 2011-11-04 MED ORDER — SODIUM CHLORIDE 0.9 % IV SOLN: INTRAVENOUS | Status: DC

## 2011-11-04 MED ORDER — LISINOPRIL 10 MG PO TABS: 10.0000 mg | ORAL_TABLET | Freq: Two times a day (BID) | ORAL | Status: DC

## 2011-11-04 MED ORDER — PROMETHAZINE HCL 25 MG/ML IJ SOLN
12.5000 mg | Freq: Four times a day (QID) | INTRAMUSCULAR | Status: DC | PRN
  Administered 2011-11-04 – 2011-11-06 (×4): 12.5 mg via INTRAVENOUS
  Filled 2011-11-04 (×5): qty 1

## 2011-11-04 MED ORDER — POTASSIUM CHLORIDE 10 MEQ/100ML IV SOLN: 10.0000 meq | INTRAVENOUS | Status: DC

## 2011-11-04 MED ORDER — POTASSIUM CHLORIDE 10 MEQ/100ML IV SOLN
10.0000 meq | INTRAVENOUS | Status: DC
  Administered 2011-11-04: 10 meq via INTRAVENOUS
  Filled 2011-11-04: qty 100

## 2011-11-04 MED ORDER — INSULIN ASPART 100 UNIT/ML ~~LOC~~ SOLN
25.0000 [IU] | Freq: Once | SUBCUTANEOUS | Status: AC
  Administered 2011-11-04: 25 [IU] via SUBCUTANEOUS

## 2011-11-04 MED ORDER — SODIUM CHLORIDE 0.9 % IV SOLN
INTRAVENOUS | Status: DC
  Administered 2011-11-04: 5.1 [IU]/h via INTRAVENOUS
  Filled 2011-11-04 (×2): qty 1

## 2011-11-04 MED ORDER — SODIUM CHLORIDE 0.9 % IJ SOLN
INTRAMUSCULAR | Status: AC
  Filled 2011-11-04: qty 3

## 2011-11-04 MED ORDER — DEXTROSE-NACL 5-0.45 % IV SOLN: INTRAVENOUS | Status: DC

## 2011-11-04 MED ORDER — PANTOPRAZOLE SODIUM 40 MG PO TBEC
40.0000 mg | DELAYED_RELEASE_TABLET | Freq: Every day | ORAL | Status: DC
  Administered 2011-11-04 – 2011-11-06 (×3): 40 mg via ORAL
  Filled 2011-11-04 (×3): qty 1

## 2011-11-04 MED ORDER — ONDANSETRON HCL 4 MG/2ML IJ SOLN
4.0000 mg | INTRAMUSCULAR | Status: DC | PRN
  Administered 2011-11-05 – 2011-11-06 (×6): 4 mg via INTRAVENOUS
  Filled 2011-11-04 (×6): qty 2

## 2011-11-04 NOTE — Progress Notes (Signed)
md informed of bs of 528 orders given. Patient transferred to stepdown and report given to [facility].

## 2011-11-04 NOTE — Progress Notes (Signed)
UR Chart Review Completed  

## 2011-11-04 NOTE — Progress Notes (Signed)
Inpatient Diabetes Program Recommendations  AACE/ADA: New Consensus Statement on Inpatient Glycemic Control (2009)  Target Ranges:  Prepandial:   less than 140 mg/dL      Peak postprandial:   less than 180 mg/dL (1-2 hours)      Critically ill patients:  140 - 180 mg/dL   Reason for Visit: Note patient admitted with high CBG's on 11/03/11.  She has insulin pump.  Basal rates per medication reconciliation were 9a-8p 2.35 units/hour and 8p-9a 1.8 units per hour.  Total basal=49.25 units per 24 hours.  Unsure what patients correction factor and meal/CHO ratios are.  CBG this AM up to 528 mg/dL and CO2 down to 15.  Patient being treated for DKA and IV insulin started.    Note that when acidosis is cleared, patient will need to receive basal insulin (via insulin pump or Lantus) at least 2 hours prior to discontinuation of IV insulin.  When insulin pump restarted, patient will need new infusion set/site to ensure proper functioning.  Will follow.  Called and discussed with RN-Celine.

## 2011-11-04 NOTE — Progress Notes (Signed)
Subjective: The patient says that she does not feel well overall. She has nausea still, but no vomiting. She says that her diarrhea has stopped for now. She has no abnormal pain or pain with urination. She has no chest congestion.  Objective: Vital signs in last 24 hours: Filed Vitals:   11/03/11 1530 11/03/11 1544 11/03/11 2143 11/04/11 0623  BP: 174/91 174/91 167/96 145/85  Pulse: 71 71 74 91  Temp: 97.9 F (36.6 C) 97.9 F (36.6 C) 98.5 F (36.9 C) 98.2 F (36.8 C)  TempSrc: Oral Oral Oral Oral  Resp: 22 22 20 20   Height: 5\' 9"  (1.753 m)     Weight: 134.4 kg (296 lb 4.8 oz)     SpO2: 92% 92% 97% 92%   No intake or output data in the 24 hours ending 11/04/11 0847  Weight change:   Physical exam: General: Obese Caucasian woman, lying in bed, in no acute distress. Lungs: Decreased breath sounds in the bases, otherwise clear. Heart: S1, S2, with a soft systolic murmur. Abdomen: Obese, positive bowel sounds, soft, nontender, nondistended. Extremities: No pedal edema.  Lab Results: Basic Metabolic Panel:  Basename 11/04/11 0731 11/04/11 0544  NA 128* 126*  K 5.6* 5.4*  CL 94* 93*  CO2 14* 15*  GLUCOSE PENDING 503*  BUN 30* 30*  CREATININE 1.59* 1.55*  CALCIUM 8.6 9.1  MG -- --  PHOS -- --   Liver Function Tests:  Basename 11/04/11 0544 11/03/11 0904  AST 9 10  ALT 8 10  ALKPHOS 127* 119*  BILITOT 0.3 0.2*  PROT 6.9 7.0  ALBUMIN 3.2* 3.3*    Basename 11/04/11 0544 11/03/11 0904  LIPASE 38 68*  AMYLASE -- --   No results found for this basename: AMMONIA:2 in the last 72 hours CBC:  Basename 11/04/11 0544 11/03/11 0904  WBC 21.8* 11.5*  NEUTROABS -- --  HGB 14.6 14.5  HCT 43.2 43.7  MCV 90.8 91.4  PLT 262 225   Cardiac Enzymes: No results found for this basename: CKTOTAL:3,CKMB:3,CKMBINDEX:3,TROPONINI:3 in the last 72 hours BNP: No results found for this basename: PROBNP:3 in the last 72 hours D-Dimer: No results found for this basename: DDIMER:2  in the last 72 hours CBG:  Basename 11/04/11 0750 11/03/11 2136 11/03/11 1655 11/03/11 1157 11/03/11 0848  GLUCAP 528* 268* 231* 272* 555*   Hemoglobin A1C:  Basename 11/03/11 0904  HGBA1C 10.8*   Fasting Lipid Panel: No results found for this basename: CHOL,HDL,LDLCALC,TRIG,CHOLHDL,LDLDIRECT in the last 72 hours Thyroid Function Tests:  Basename 11/03/11 0904  TSH 2.312  T4TOTAL --  FREET4 --  T3FREE --  THYROIDAB --   Anemia Panel: No results found for this basename: VITAMINB12,FOLATE,FERRITIN,TIBC,IRON,RETICCTPCT in the last 72 hours Coagulation: No results found for this basename: LABPROT:2,INR:2 in the last 72 hours Urine Drug Screen: Drugs of Abuse     Component Value Date/Time   LABOPIA NONE DETECTED 05/05/2010 0011   COCAINSCRNUR NONE DETECTED 05/05/2010 0011   LABBENZ POSITIVE* 05/05/2010 0011   AMPHETMU NONE DETECTED 05/05/2010 0011   THCU NONE DETECTED 05/05/2010 0011   LABBARB  Value: NONE DETECTED        DRUG SCREEN FOR MEDICAL PURPOSES ONLY.  IF CONFIRMATION IS NEEDED FOR ANY PURPOSE, NOTIFY LAB WITHIN 5 DAYS.        LOWEST DETECTABLE LIMITS FOR URINE DRUG SCREEN Drug Class       Cutoff (ng/mL) Amphetamine      1000 Barbiturate      200 Benzodiazepine  [id number] Tricyclics       [id number] Opiates          300 Cocaine          300 THC              50 05/05/2010 0011    Alcohol Level: No results found for this basename: ETH:2 in the last 72 hours Urinalysis:  Basename 11/03/11 1319  COLORURINE YELLOW  LABSPEC 1.015  PHURINE 7.0  GLUCOSEU >1000*  HGBUR SMALL*  BILIRUBINUR NEGATIVE  KETONESUR NEGATIVE  PROTEINUR 100*  UROBILINOGEN 0.2  NITRITE NEGATIVE  LEUKOCYTESUR NEGATIVE   Misc. Labs:   Micro: No results found for this or any previous visit (from the past 240 hour(s)).  Studies/Results: Dg Chest Port 1 View  11/03/2011  *RADIOLOGY REPORT*  Clinical Data: 41 year old female with hyperglycemia.  Diabetes, hypertension.  PORTABLE CHEST - 1 VIEW   Comparison: 06/17/2011 and earlier.  Findings: Portable upright AP view at 0937 hours.  Chronically low lung volumes.  Cardiac size and mediastinal contours are within normal limits.  Visualized tracheal air column is within normal limits.  No pneumothorax, pleural effusion or consolidation. Mildly increased above baseline pulmonary vascular congestion, but no overt edema.  IMPRESSION: Chronically low lung volumes.  Mild vascular congestion without overt edema.  Original Report Authenticated By: Randall An, M.D.    Medications:  Scheduled:   . carvedilol  25 mg Oral BID WC  . escitalopram  10 mg Oral QAC breakfast  . heparin  5,000 Units Subcutaneous Q8H  . insulin aspart  25 Units Subcutaneous Once  . insulin regular  10 Units Intravenous Once  . lamoTRIgine  200 mg Oral QHS  . levothyroxine  88 mcg Oral QAC breakfast  . mirtazapine  30 mg Oral QHS  . ondansetron  4 mg Intravenous Once  . pantoprazole (PROTONIX) IV  40 mg Intravenous Q24H  . sodium chloride  1,000 mL Intravenous Once  . sodium chloride  500 mL Intravenous Once  . DISCONTD: enoxaparin  40 mg Subcutaneous Q24H  . DISCONTD: fentaNYL  50 mcg Intravenous Once  . DISCONTD: insulin aspart  0-15 Units Subcutaneous TID WC  . DISCONTD: insulin aspart  0-5 Units Subcutaneous QHS  . DISCONTD: insulin glargine  15 Units Subcutaneous BID  . DISCONTD: lisinopril  10 mg Oral BID  . DISCONTD: lisinopril  20 mg Oral BID  . DISCONTD: ondansetron (ZOFRAN) IV  4 mg Intravenous Q6H  . DISCONTD: potassium chloride  10 mEq Intravenous Q1H   Continuous:   . sodium chloride 125 mL/hr at 11/04/11 0814  . dextrose 5 % and 0.45% NaCl    . insulin (NOVOLIN-R) infusion    . DISCONTD: sodium chloride    . DISCONTD: 0.9 % NaCl with KCl 20 mEq / L 75 mL/hr at 11/04/11 0443   [id number], hydrALAZINE, promethazine, DISCONTD: promethazine  Assessment: Active Problems:  Diarrhea  Nausea  DM (diabetes mellitus), type 1, uncontrolled   Hyponatremia  CKD (chronic kidney disease), stage II  Elevated lipase  Hypothyroidism  HTN (hypertension), malignant  Tobacco abuse  DKA, type 1   1. DKA and uncontrolled type 1 diabetes mellitus. Hemoglobin A1c 10.8. Probable malfunction of insulin pump at home. The patient transitioned to DKA overnight, despite sliding scale NovoLog and Lantus. Her anion gap has increased 18 and her bicarbonate has decreased to 14.   Hyperkalemia, secondary to fracture of DKA. Also possible contribution from ACE inhibitor. With treatment of DKA, her serum potassium is  expected to improve. Next  Hyponatremia, secondary to uncontrolled hyperglycemia.  Leukocytosis, likely secondary to DKA. No signs of infection.  Malignant hypertension. We'll continue carvedilol. Lisinopril will need to be held. When necessary IV hydralazine will be ordered for treatment. We'll consider adding clonidine.  Chronic kidney disease. Relatively stable. We'll need to watch volume status.  Diarrhea. Resolved for now.  Bipolar disorder. Stable.  Tobacco abuse. The patient was advised to stop. Tobacco cessation counseling was ordered.   Hypothyroidism. TSH is within normal limits on treatment.  Nausea. This may be secondary to DKA. Her lipase has normalized. The patient has requested Phenergan and stent of Zofran for treatment.    Plan: 1. We'll start the DKA protocol. The patient will be transferred to the step down unit. 2. Ice chips and sips of clear liquids permitted. We'll discontinue the clear liquid diet. 3. We'll discontinue Zofran in favor of Phenergan as needed for nausea. 4. We'll discontinue lisinopril and the setting of hyperkalemia.     LOS: 1 day   Jasmine [name] 11/04/2011, 8:47 AM

## 2011-11-05 DIAGNOSIS — R110 Nausea: Secondary | ICD-10-CM

## 2011-11-05 DIAGNOSIS — E1029 Type 1 diabetes mellitus with other diabetic kidney complication: Secondary | ICD-10-CM

## 2011-11-05 DIAGNOSIS — N182 Chronic kidney disease, stage 2 (mild): Secondary | ICD-10-CM

## 2011-11-05 DIAGNOSIS — E1065 Type 1 diabetes mellitus with hyperglycemia: Secondary | ICD-10-CM

## 2011-11-05 DIAGNOSIS — E1010 Type 1 diabetes mellitus with ketoacidosis without coma: Secondary | ICD-10-CM

## 2011-11-05 LAB — BASIC METABOLIC PANEL
BUN: 22 mg/dL (ref 6–23)
BUN: 24 mg/dL — ABNORMAL HIGH (ref 6–23)
BUN: 28 mg/dL — ABNORMAL HIGH (ref 6–23)
CO2: 21 mEq/L (ref 19–32)
Calcium: 8.7 mg/dL (ref 8.4–10.5)
Calcium: 8.7 mg/dL (ref 8.4–10.5)
Calcium: 8.8 mg/dL (ref 8.4–10.5)
Chloride: 103 mEq/L (ref 96–112)
Creatinine, Ser: 1.51 mg/dL — ABNORMAL HIGH (ref 0.50–1.10)
GFR calc Af Amer: 51 mL/min — ABNORMAL LOW (ref >90)
GFR calc Af Amer: 54 mL/min — ABNORMAL LOW (ref >90)
GFR calc non Af Amer: 38 mL/min — ABNORMAL LOW (ref >90)
GFR calc non Af Amer: 44 mL/min — ABNORMAL LOW (ref >90)
GFR calc non Af Amer: 46 mL/min — ABNORMAL LOW (ref >90)
Glucose, Bld: 155 mg/dL — ABNORMAL HIGH (ref 70–99)
Glucose, Bld: 204 mg/dL — ABNORMAL HIGH (ref 70–99)
Glucose, Bld: 316 mg/dL — ABNORMAL HIGH (ref 70–99)
Potassium: 3.6 mEq/L (ref 3.5–5.1)
Potassium: 4.1 mEq/L (ref 3.5–5.1)
Potassium: 4.2 mEq/L (ref 3.5–5.1)
Potassium: 4.2 mEq/L (ref 3.5–5.1)
Sodium: 131 mEq/L — ABNORMAL LOW (ref 135–145)
Sodium: 131 mEq/L — ABNORMAL LOW (ref 135–145)
Sodium: 133 mEq/L — ABNORMAL LOW (ref 135–145)

## 2011-11-05 LAB — CBC
Hemoglobin: 13.5 g/dL (ref 12.0–15.0)
MCH: 30.5 pg (ref 26.0–34.0)
MCHC: 34.1 g/dL (ref 30.0–36.0)
RDW: 12.7 % (ref 11.5–15.5)

## 2011-11-05 LAB — GLUCOSE, CAPILLARY
Glucose-Capillary: 202 mg/dL — ABNORMAL HIGH (ref 70–99)
Glucose-Capillary: 310 mg/dL — ABNORMAL HIGH (ref 70–99)
Glucose-Capillary: 204 mg/dL — ABNORMAL HIGH (ref 70–99)
Glucose-Capillary: 173 mg/dL — ABNORMAL HIGH (ref 70–99)
Glucose-Capillary: 170 mg/dL — ABNORMAL HIGH (ref 70–99)

## 2011-11-05 MED ORDER — INSULIN ASPART 100 UNIT/ML ~~LOC~~ SOLN: 0.0000 [IU] | Freq: Every day | SUBCUTANEOUS | Status: DC

## 2011-11-05 MED ORDER — LAMOTRIGINE 25 MG PO TABS
ORAL_TABLET | ORAL | Status: AC
  Filled 2011-11-05: qty 8

## 2011-11-05 MED ORDER — INSULIN ASPART 100 UNIT/ML ~~LOC~~ SOLN
4.0000 [IU] | Freq: Three times a day (TID) | SUBCUTANEOUS | Status: DC
  Administered 2011-11-05 (×3): 4 [IU] via SUBCUTANEOUS

## 2011-11-05 MED ORDER — LISINOPRIL 10 MG PO TABS
20.0000 mg | ORAL_TABLET | Freq: Two times a day (BID) | ORAL | Status: DC
  Administered 2011-11-05 – 2011-11-06 (×3): 20 mg via ORAL
  Filled 2011-11-05 (×3): qty 2

## 2011-11-05 MED ORDER — INSULIN GLARGINE 100 UNIT/ML ~~LOC~~ SOLN
30.0000 [IU] | Freq: Two times a day (BID) | SUBCUTANEOUS | Status: DC
  Administered 2011-11-05 (×2): 30 [IU] via SUBCUTANEOUS

## 2011-11-05 MED ORDER — LISINOPRIL 10 MG PO TABS
10.0000 mg | ORAL_TABLET | Freq: Two times a day (BID) | ORAL | Status: DC
  Administered 2011-11-05: 10 mg via ORAL
  Filled 2011-11-05: qty 1

## 2011-11-05 MED ORDER — ACETAMINOPHEN 325 MG PO TABS
650.0000 mg | ORAL_TABLET | ORAL | Status: DC | PRN
Start: 2011-11-05 — End: 2011-11-07
  Administered 2011-11-05 – 2011-11-06 (×2): 650 mg via ORAL
  Filled 2011-11-05: qty 2

## 2011-11-05 MED ORDER — SODIUM CHLORIDE 0.9 % IJ SOLN
INTRAMUSCULAR | Status: AC
  Filled 2011-11-05: qty 3

## 2011-11-05 MED ORDER — INSULIN ASPART 100 UNIT/ML ~~LOC~~ SOLN
0.0000 [IU] | Freq: Three times a day (TID) | SUBCUTANEOUS | Status: DC
  Administered 2011-11-05: 15 [IU] via SUBCUTANEOUS
  Administered 2011-11-05: 7 [IU] via SUBCUTANEOUS
  Administered 2011-11-05 – 2011-11-06 (×2): 4 [IU] via SUBCUTANEOUS
  Administered 2011-11-06: 7 [IU] via SUBCUTANEOUS
  Administered 2011-11-06: 4 [IU] via SUBCUTANEOUS

## 2011-11-05 MED ORDER — INSULIN GLARGINE 100 UNIT/ML ~~LOC~~ SOLN
50.0000 [IU] | SUBCUTANEOUS | Status: AC
  Administered 2011-11-05: 50 [IU] via SUBCUTANEOUS

## 2011-11-05 MED ORDER — ACETAMINOPHEN 325 MG PO TABS
ORAL_TABLET | ORAL | Status: AC
  Filled 2011-11-05: qty 2

## 2011-11-05 NOTE — Progress Notes (Addendum)
Inpatient Diabetes Program Recommendations  AACE/ADA: New Consensus Statement on Inpatient Glycemic Control (2009)  Target Ranges:  Prepandial:   less than 140 mg/dL      Peak postprandial:   less than 180 mg/dL (1-2 hours)      Critically ill patients:  140 - 180 mg/dL   Reason for Visit: Note patient transitioned off insulin drip in the middle of the night.  She received 50 units of Lantus when transitioned off insulin drip.  Note plans for [employer] insulin pump to not be restarted during hospitalization.  Called patient this morning in her room.  She explained that she woke up in the middle of the night with very high CBG's.  She then changed out her site, set and insulin in the insulin pump.  States she bolused herself and then went back to sleep.  When she woke up her CBG's were still high.  She states Care coordinator took pump from her room  to call manufacturer.  Agree that there may be malfunction with insulin pump.  Patient will need to follow-up with her endocrinologist-Dr. Posey Pronto as soon as possible after discharge.  Note plans for discharge on Lantus/Novolog pens.  She states she feels comfortable with this.  A1C is 10.8% indicating sub-optimal control prior to admit.  Patient will likely need more meal coverage.  Patient states that she attempts to CHO count but is not always accurate.  She does give meal coverage according to what she is eating.  Will be glad to assist as needed.     1200 Spoke with patient again.  She states she does have insulin pump with her.  I explained that she needs to call [phone] number on insulin pump to report malfunction so that new pump can be sent.  Patient verbalized understanding.

## 2011-11-05 NOTE — Progress Notes (Signed)
The patient's followup BMET results have been reviewed. She is also more hypertensive this afternoon. Now that her creatinine is back to baseline, will increase the lisinopril back to 20 mg twice a day. We'll transfer her from step down to telemetry.

## 2011-11-05 NOTE — Progress Notes (Signed)
Report called to RN. Pt to be transferred to room 320 per MD order. Pt transferred to room 320 via wheelchair. Family members aware of transfer.

## 2011-11-05 NOTE — Progress Notes (Signed)
Subjective: The patient feels better, though not completely herself. She denies diarrhea. She wants to try to eat solid foods. Her nausea has subsided.  Objective: Vital signs in last 24 hours: Filed Vitals:   11/05/11 0400 11/05/11 0500 11/05/11 0600 11/05/11 0650  BP: 162/95 159/95 159/67   Pulse: 81 81 68 81  Temp: 98.7 F (37.1 C)     TempSrc: Oral     Resp: 20 20 24 17   Height:      Weight:   134.1 kg (295 lb 10.2 oz)   SpO2: 94% 92% 94% 95%    Intake/Output Summary (Last 24 hours) at 11/05/11 [id number] Last data filed at 11/05/11 0600  Gross per 24 hour  Intake 3386.25 ml  Output   2150 ml  Net 1236.25 ml    Weight change: 8.929 kg (19 lb 11 oz)  Physical exam: General: Obese Caucasian woman, lying in bed, in no acute distress. Lungs: Decreased breath sounds in the bases, otherwise clear. Heart: S1, S2, with a soft systolic murmur. Abdomen: Obese, positive bowel sounds, soft, nontender, nondistended. Extremities: No pedal edema.  Lab Results: Basic Metabolic Panel:  Basename 11/05/11 0344 11/04/11 2347  NA 133* 133*  K 4.1 4.2  CL 103 102  CO2 21 21  GLUCOSE 204* 155*  BUN 24* 28*  CREATININE 1.51* 1.67*  CALCIUM 8.8 8.7  MG -- --  PHOS -- --   Liver Function Tests:  Basename 11/04/11 0544 11/03/11 0904  AST 9 10  ALT 8 10  ALKPHOS 127* 119*  BILITOT 0.3 0.2*  PROT 6.9 7.0  ALBUMIN 3.2* 3.3*    Basename 11/04/11 0544 11/03/11 0904  LIPASE 38 68*  AMYLASE -- --   No results found for this basename: AMMONIA:2 in the last 72 hours CBC:  Basename 11/05/11 0345 11/04/11 0544  WBC 14.8* 21.8*  NEUTROABS -- --  HGB 13.5 14.6  HCT 39.6 43.2  MCV 89.4 90.8  PLT 254 262   Cardiac Enzymes: No results found for this basename: CKTOTAL:3,CKMB:3,CKMBINDEX:3,TROPONINI:3 in the last 72 hours BNP: No results found for this basename: PROBNP:3 in the last 72 hours D-Dimer: No results found for this basename: DDIMER:2 in the last 72 hours CBG:  Basename  11/05/11 0506 11/05/11 0304 11/05/11 0037 11/04/11 2340 11/04/11 2238 11/04/11 2122  GLUCAP 202* 162* 149* 156* 142* 130*   Hemoglobin A1C:  Basename 11/03/11 0904  HGBA1C 10.8*   Fasting Lipid Panel: No results found for this basename: CHOL,HDL,LDLCALC,TRIG,CHOLHDL,LDLDIRECT in the last 72 hours Thyroid Function Tests:  Basename 11/03/11 0904  TSH 2.312  T4TOTAL --  FREET4 --  T3FREE --  THYROIDAB --   Anemia Panel: No results found for this basename: VITAMINB12,FOLATE,FERRITIN,TIBC,IRON,RETICCTPCT in the last 72 hours Coagulation: No results found for this basename: LABPROT:2,INR:2 in the last 72 hours Urine Drug Screen: Drugs of Abuse     Component Value Date/Time   LABOPIA NONE DETECTED 05/05/2010 0011   COCAINSCRNUR NONE DETECTED 05/05/2010 0011   LABBENZ POSITIVE* 05/05/2010 0011   AMPHETMU NONE DETECTED 05/05/2010 0011   THCU NONE DETECTED 05/05/2010 0011   LABBARB  Value: NONE DETECTED        DRUG SCREEN FOR MEDICAL PURPOSES ONLY.  IF CONFIRMATION IS NEEDED FOR ANY PURPOSE, NOTIFY LAB WITHIN 5 DAYS.        LOWEST DETECTABLE LIMITS FOR URINE DRUG SCREEN Drug Class       Cutoff (ng/mL) Amphetamine      1000 Barbiturate  200 Benzodiazepine   [id number] Tricyclics       [id number] Opiates          300 Cocaine          300 THC              50 05/05/2010 0011    Alcohol Level: No results found for this basename: ETH:2 in the last 72 hours Urinalysis:  Basename 11/03/11 1319  COLORURINE YELLOW  LABSPEC 1.015  PHURINE 7.0  GLUCOSEU >1000*  HGBUR SMALL*  BILIRUBINUR NEGATIVE  KETONESUR NEGATIVE  PROTEINUR 100*  UROBILINOGEN 0.2  NITRITE NEGATIVE  LEUKOCYTESUR NEGATIVE   Misc. Labs:   Micro: Recent Results (from the past 240 hour(s))  MRSA PCR SCREENING     Status: Normal   Collection Time   11/04/11 10:29 AM      Component Value Range Status Comment   MRSA by PCR NEGATIVE  NEGATIVE  Final     Studies/Results: Dg Chest Port 1 View  11/03/2011  *RADIOLOGY REPORT*   Clinical Data: 41 year old female with hyperglycemia.  Diabetes, hypertension.  PORTABLE CHEST - 1 VIEW  Comparison: 06/17/2011 and earlier.  Findings: Portable upright AP view at 0937 hours.  Chronically low lung volumes.  Cardiac size and mediastinal contours are within normal limits.  Visualized tracheal air column is within normal limits.  No pneumothorax, pleural effusion or consolidation. Mildly increased above baseline pulmonary vascular congestion, but no overt edema.  IMPRESSION: Chronically low lung volumes.  Mild vascular congestion without overt edema.  Original Report Authenticated By: Randall An, M.D.    Medications:  Scheduled:    . acetaminophen      . carvedilol  25 mg Oral BID WC  . escitalopram  10 mg Oral QAC breakfast  . heparin  5,000 Units Subcutaneous Q8H  . insulin aspart  0-20 Units Subcutaneous TID WC  . insulin aspart  0-5 Units Subcutaneous QHS  . insulin aspart  25 Units Subcutaneous Once  . insulin aspart  4 Units Subcutaneous TID WC  . insulin glargine  30 Units Subcutaneous BID  . insulin glargine  50 Units Subcutaneous STAT  . lamoTRIgine  200 mg Oral QHS  . levothyroxine  88 mcg Oral QAC breakfast  . lisinopril  10 mg Oral BID  . mirtazapine  30 mg Oral QHS  . pantoprazole  40 mg Oral Q1200  . potassium chloride  10 mEq Intravenous Q1 Hr x 3  . potassium chloride  10 mEq Intravenous Q1 Hr x 3  . sodium chloride      . sodium chloride      . sodium chloride      . DISCONTD: lisinopril  10 mg Oral BID  . DISCONTD: ondansetron (ZOFRAN) IV  4 mg Intravenous Q6H  . DISCONTD: pantoprazole (PROTONIX) IV  40 mg Intravenous Q24H  . DISCONTD: potassium chloride  10 mEq Intravenous Q1H  . DISCONTD: potassium chloride  10 mEq Intravenous Q1 Hr x 4   Continuous:    . sodium chloride 150 mL/hr at 11/04/11 1700  . DISCONTD: dextrose 5 % and 0.45% NaCl    . DISCONTD: dextrose 5 % and 0.45% NaCl 1,000 mL (11/05/11 [id number])  . DISCONTD: insulin (NOVOLIN-R)  infusion 5.1 Units/hr (11/04/11 1011)   [id number], dextrose, hydrALAZINE, ondansetron (ZOFRAN) IV, promethazine, DISCONTD: promethazine  Assessment: Active Problems:  Diarrhea  Nausea  DM (diabetes mellitus), type 1, uncontrolled  Hyponatremia  CKD (chronic kidney disease), stage II  Elevated lipase  Hypothyroidism  HTN (hypertension), malignant  Tobacco abuse  DKA, type 1   1. DKA and uncontrolled type 1 diabetes mellitus. Hemoglobin A1c 10.8. Probable malfunction of insulin pump at home. The patient transitioned to DKA during the hospitalization. She is now out of DKA, status post DKA protocol.   Hyperkalemia, secondary to fracture of DKA. Resolved. Secondary to DKA with contribution from ACE inhibitor.  Hyponatremia, secondary to uncontrolled hyperglycemia, resolving.  Leukocytosis, likely secondary to DKA. No signs of infection.  Malignant hypertension. We'll continue carvedilol. Lisinopril was held for hyperkalemia and worsening renal function. But now that her serum potassium and creatinine are back to baseline, will restart lisinopril at a lower dose. We'll titrate accordingly.  Chronic kidney disease. Her creatinine is about back to baseline.  Diarrhea. Resolved. No diarrhea during hospitalization. We'll discontinue contact precautions.  Bipolar disorder. Stable.  Tobacco abuse. The patient was advised to stop. Tobacco cessation counseling was ordered.   Hypothyroidism. TSH is within normal limits on treatment.  Nausea. This may have been secondary to DKA. Her lipase has normalized. Nausea is resolving.    Plan: 1. Start Lantus and sliding-scale NovoLog. Add mealtime coverage. 2. Discontinue dextrose in IV fluids. 3. Advance diet. 4. Restart lisinopril at half of her home dose and titrate accordingly. 5. Advised patient to not use her current insulin pump when she is discharged. She stated that she will obtain another insulin pump. She will likely need  prescriptions for Lantus and NovoLog pens upon discharge. 6. Transfer out of the step down unit today if her next basic metabolic panel remained stable.     LOS: 2 days   Jasmine [name] 11/05/2011, 8:06 AM

## 2011-11-05 NOTE — Plan of Care (Signed)
Problem: Consults Goal: Diabetes Mellitus Patient Education See Patient Education Module for education specifics.  Outcome: Progressing Patient on insulin gtt using glucostabilizer and basic metabolic panels Q4 hrs.   Goal: Diagnosis-Diabetes Mellitus Outcome: Progressing  uncontrolled type 1 Diabetes , DKA type 1  Uses own insulin pump at home Goal: Nutrition Consult-if indicated Outcome: Progressing Clear liquids off glucostabilizer  Problem: Phase I Progression Outcomes Goal: Pain controlled with appropriate interventions Outcome: Not Applicable Date Met:  123456 No c/o pain, Nausea only Goal: OOB as tolerated unless otherwise ordered Outcome: Progressing oob with minimal assist with lines Goal: Initial discharge plan identified Outcome: Progressing To home

## 2011-11-05 NOTE — Care Management Note (Signed)
    Page 1 of 1   11/08/2011     4:53:59 PM   CARE MANAGEMENT NOTE 11/08/2011  Patient:  Jasmine [name], Jasmine [name]   Account Number:  [id number]  Date Initiated:  11/05/2011  Documentation initiated by:  Theophilus Kinds  Subjective/Objective Assessment:   Pt admitted from home with hyperglycemia. Pt lives with parents and will return home at discharge. Pt does have an insulin pump.     Action/Plan:   No CM needs noted at this time.   Anticipated DC Date:  11/07/2011   Anticipated DC Plan:  [place]  CM consult      Choice offered to / List presented to:             Status of service:  Completed, signed off Medicare Important Message given?   (If response is "NO", the following Medicare IM given date fields will be blank) Date Medicare IM given:   Date Additional Medicare IM given:    Discharge Disposition:  HOME/SELF CARE  Per UR Regulation:    If discussed at Long Length of Stay Meetings, dates discussed:    Comments:  11/08/11 1650 eneva Abram Sax RN Pt D/C home on 11/07/11 11/05/11 1700 Hanadi Stanly RN Insulin pump may be faulty and pt is to have it replaced. Spoke with the company she uses [facility], [phone] and they say she will need to call manufacturer and have them send her a new one and send back her old one- their # is Olena Mater- ****[phone]*** 11/05/11 [place], RN BSN CM

## 2011-11-06 DIAGNOSIS — I10 Essential (primary) hypertension: Secondary | ICD-10-CM

## 2011-11-06 DIAGNOSIS — N182 Chronic kidney disease, stage 2 (mild): Secondary | ICD-10-CM

## 2011-11-06 DIAGNOSIS — E1065 Type 1 diabetes mellitus with hyperglycemia: Secondary | ICD-10-CM

## 2011-11-06 DIAGNOSIS — R110 Nausea: Secondary | ICD-10-CM

## 2011-11-06 DIAGNOSIS — E1029 Type 1 diabetes mellitus with other diabetic kidney complication: Secondary | ICD-10-CM

## 2011-11-06 LAB — BASIC METABOLIC PANEL
BUN: 14 mg/dL (ref 6–23)
CO2: 22 mEq/L (ref 19–32)
Calcium: 8.8 mg/dL (ref 8.4–10.5)
GFR calc non Af Amer: 50 mL/min — ABNORMAL LOW (ref >90)
Glucose, Bld: 229 mg/dL — ABNORMAL HIGH (ref 70–99)

## 2011-11-06 LAB — HEPATIC FUNCTION PANEL
ALT: 8 U/L (ref 0–35)
AST: 10 U/L (ref 0–37)
Albumin: 3.2 g/dL — ABNORMAL LOW (ref 3.5–5.2)
Bilirubin, Direct: 0.5 mg/dL — ABNORMAL HIGH (ref 0.0–0.3)

## 2011-11-06 LAB — CBC
Hemoglobin: 14.5 g/dL (ref 12.0–15.0)
MCH: 30.7 pg (ref 26.0–34.0)
MCHC: 34.5 g/dL (ref 30.0–36.0)
MCV: 89 fL (ref 78.0–100.0)
Platelets: 237 10*3/uL (ref 150–400)
RBC: 4.72 MIL/uL (ref 3.87–5.11)

## 2011-11-06 LAB — GLUCOSE, CAPILLARY
Glucose-Capillary: 225 mg/dL — ABNORMAL HIGH (ref 70–99)
Glucose-Capillary: 152 mg/dL — ABNORMAL HIGH (ref 70–99)
Glucose-Capillary: 180 mg/dL — ABNORMAL HIGH (ref 70–99)

## 2011-11-06 MED ORDER — PROMETHAZINE HCL 25 MG/ML IJ SOLN
25.0000 mg | Freq: Four times a day (QID) | INTRAMUSCULAR | Status: DC | PRN
  Administered 2011-11-06 – 2011-11-07 (×3): 25 mg via INTRAVENOUS
  Filled 2011-11-06 (×3): qty 1

## 2011-11-06 MED ORDER — POTASSIUM CHLORIDE IN NACL 20-0.9 MEQ/L-% IV SOLN
INTRAVENOUS | Status: DC
  Administered 2011-11-06 – 2011-11-07 (×2): via INTRAVENOUS

## 2011-11-06 MED ORDER — METOCLOPRAMIDE HCL 10 MG PO TABS
10.0000 mg | ORAL_TABLET | Freq: Three times a day (TID) | ORAL | Status: DC
  Administered 2011-11-06 (×3): 10 mg via ORAL
  Filled 2011-11-06 (×6): qty 1

## 2011-11-06 MED ORDER — METOCLOPRAMIDE HCL 10 MG PO TABS
10.0000 mg | ORAL_TABLET | ORAL | Status: AC
  Administered 2011-11-06: 10 mg via ORAL

## 2011-11-06 MED ORDER — SODIUM CHLORIDE 0.9 % IJ SOLN
INTRAMUSCULAR | Status: AC
  Administered 2011-11-06: 10 mL
  Filled 2011-11-06: qty 3

## 2011-11-06 MED ORDER — INSULIN GLARGINE 100 UNIT/ML ~~LOC~~ SOLN
40.0000 [IU] | Freq: Every day | SUBCUTANEOUS | Status: DC
  Administered 2011-11-06: 40 [IU] via SUBCUTANEOUS

## 2011-11-06 MED ORDER — SODIUM CHLORIDE 0.9 % IJ SOLN
INTRAMUSCULAR | Status: AC
  Administered 2011-11-06: 19:00:00
  Filled 2011-11-06: qty 3

## 2011-11-06 MED ORDER — INSULIN ASPART 100 UNIT/ML ~~LOC~~ SOLN: 6.0000 [IU] | Freq: Three times a day (TID) | SUBCUTANEOUS | Status: DC

## 2011-11-06 MED ORDER — LAMOTRIGINE 25 MG PO TABS
ORAL_TABLET | ORAL | Status: AC
  Filled 2011-11-06: qty 8

## 2011-11-06 NOTE — Progress Notes (Signed)
Chart reviewed.  Subjective: Nauseated. Zofran not helping. Phenergan helping only minimally. Patient requesting something else for nausea. A little bit of Jell-O. Complains of a bandlike pain across her midabdomen. No further diarrhea.  Objective: Vital signs in last 24 hours: Filed Vitals:   11/05/11 1700 11/05/11 1801 11/05/11 2210 11/06/11 0503  BP: 178/113 174/89 176/110 180/92  Pulse:  78 78 82  Temp:  98.5 F (36.9 C) 98.2 F (36.8 C) 98.3 F (36.8 C)  TempSrc:  Oral Oral Oral  Resp: 17 24 22 19   Height:      Weight:      SpO2:  96% 98% 94%   Weight change:   Intake/Output Summary (Last 24 hours) at 11/06/11 0835 Last data filed at 11/06/11 0603  Gross per 24 hour  Intake 6037.09 ml  Output   2400 ml  Net 3637.09 ml   General: Uncomfortable. Lungs clear to auscultation bilaterally without wheeze rhonchi or rales Cardiovascular regular rate rhythm without murmurs gallops rubs Abdomen soft nontender nondistended Extremities no clubbing cyanosis or edema Psychiatric: Normal affect. Calm and cooperative.  Lab Results: Basic Metabolic Panel:  Lab [id number] 0600 11/05/11 1332  NA 130* 131*  K 3.6 3.6  CL 99 101  CO2 22 21  GLUCOSE 229* 193*  BUN 14 19  CREATININE 1.33* 1.41*  CALCIUM 8.8 8.8  MG -- --  PHOS -- --   Liver Function Tests:  Lab 11/04/11 0544 11/03/11 0904  AST 9 10  ALT 8 10  ALKPHOS 127* 119*  BILITOT 0.3 0.2*  PROT 6.9 7.0  ALBUMIN 3.2* 3.3*    Lab 11/04/11 0544 11/03/11 0904  LIPASE 38 68*  AMYLASE -- --   No results found for this basename: AMMONIA:2 in the last 168 hours CBC:  Lab 11/06/11 0600 11/05/11 0345  WBC 10.4 14.8*  NEUTROABS -- --  HGB 14.5 13.5  HCT 42.0 39.6  MCV 89.0 89.4  PLT 237 254   Cardiac Enzymes: No results found for this basename: CKTOTAL:3,CKMB:3,CKMBINDEX:3,TROPONINI:3 in the last 168 hours BNP: No results found for this basename: PROBNP:3 in the last 168 hours D-Dimer: No results found for  this basename: DDIMER:2 in the last 168 hours CBG:  Lab 11/06/11 0731 11/05/11 2207 11/05/11 1648 11/05/11 1129 11/05/11 0756 11/05/11 0506  GLUCAP 225* 170* 173* 204* 310* 202*   Hemoglobin A1C:  Lab 11/03/11 0904  HGBA1C 10.8*   Fasting Lipid Panel: No results found for this basename: CHOL,HDL,LDLCALC,TRIG,CHOLHDL,LDLDIRECT in the last 168 hours Thyroid Function Tests:  Lab 11/03/11 0904  TSH 2.312  T4TOTAL --  FREET4 --  T3FREE --  THYROIDAB --   Coagulation: No results found for this basename: LABPROT:4,INR:4 in the last 168 hours Anemia Panel: No results found for this basename: VITAMINB12,FOLATE,FERRITIN,TIBC,IRON,RETICCTPCT in the last 168 hours Urine Drug Screen: Drugs of Abuse     Component Value Date/Time   LABOPIA NONE DETECTED 05/05/2010 0011   COCAINSCRNUR NONE DETECTED 05/05/2010 0011   LABBENZ POSITIVE* 05/05/2010 0011   AMPHETMU NONE DETECTED 05/05/2010 0011   THCU NONE DETECTED 05/05/2010 0011   LABBARB  Value: NONE DETECTED        DRUG SCREEN FOR MEDICAL PURPOSES ONLY.  IF CONFIRMATION IS NEEDED FOR ANY PURPOSE, NOTIFY LAB WITHIN 5 DAYS.        LOWEST DETECTABLE LIMITS FOR URINE DRUG SCREEN Drug Class       Cutoff (ng/mL) Amphetamine      1000 Barbiturate      200 Benzodiazepine  [id number] Tricyclics       [id number] Opiates          300 Cocaine          300 THC              50 05/05/2010 0011    Alcohol Level: No results found for this basename: ETH:2 in the last 168 hours Urinalysis:  Lab 11/03/11 1319  COLORURINE YELLOW  LABSPEC 1.015  PHURINE 7.0  GLUCOSEU >1000*  HGBUR SMALL*  BILIRUBINUR NEGATIVE  KETONESUR NEGATIVE  PROTEINUR 100*  UROBILINOGEN 0.2  NITRITE NEGATIVE  LEUKOCYTESUR NEGATIVE   Micro Results: Recent Results (from the past 240 hour(s))  MRSA PCR SCREENING     Status: Normal   Collection Time   11/04/11 10:29 AM      Component Value Range Status Comment   MRSA by PCR NEGATIVE  NEGATIVE  Final    Studies/Results: No results  found.  Scheduled Meds:   . acetaminophen      . carvedilol  25 mg Oral BID WC  . escitalopram  10 mg Oral QAC breakfast  . heparin  5,000 Units Subcutaneous Q8H  . insulin aspart  0-20 Units Subcutaneous TID WC  . insulin aspart  0-5 Units Subcutaneous QHS  . insulin aspart  6 Units Subcutaneous TID WC  . insulin glargine  40 Units Subcutaneous QHS  . lamoTRIgine  200 mg Oral QHS  . levothyroxine  88 mcg Oral QAC breakfast  . lisinopril  20 mg Oral BID  . metoCLOPramide  10 mg Oral TID AC & HS  . mirtazapine  30 mg Oral QHS  . pantoprazole  40 mg Oral Q1200  . sodium chloride      . sodium chloride      . DISCONTD: insulin aspart  4 Units Subcutaneous TID WC  . DISCONTD: insulin glargine  30 Units Subcutaneous BID  . DISCONTD: lisinopril  10 mg Oral BID   Continuous Infusions:   . sodium chloride 70 mL/hr at 11/05/11 2205   PRN Meds:.acetaminophen, dextrose, hydrALAZINE, promethazine, DISCONTD: ondansetron (ZOFRAN) IV, DISCONTD: promethazine Assessment/Plan: Active Problems:  Gastroenteritis  DM (diabetes mellitus), type 1, uncontrolled  DKA, type 1, resolved  Hyponatremia  Elevated lipase  HTN (hypertension), malignant  CKD (chronic kidney disease), stage II  Hypothyroidism  Tobacco abuse  Nausea continues. No further diarrhea. Could be gastroparesis. Will try Reglan 10 mg by mouth Q. a.c. and at bedtime. Stop Zofran as it is not helping. May continue Phenergan but will increase dose (when necessary). Repeat LFTs and lipase. Should her nausea continue, may need to image the abdomen to rule out biliary etiology, abscess, other pathology. Continue IV fluids. Monitor electrolytes. Stop telemetry. Patient had previously been on 60 units of Lantus with 10-15 NovoLog meal coverage. Will currently and his dose to 40 units nightly and increase meal coverage to 6 units.   LOS: 3 days   Jasmine [name] L 11/06/2011, 8:35 AM

## 2011-11-07 DIAGNOSIS — I10 Essential (primary) hypertension: Secondary | ICD-10-CM

## 2011-11-07 DIAGNOSIS — E1065 Type 1 diabetes mellitus with hyperglycemia: Secondary | ICD-10-CM

## 2011-11-07 DIAGNOSIS — R110 Nausea: Secondary | ICD-10-CM

## 2011-11-07 DIAGNOSIS — N182 Chronic kidney disease, stage 2 (mild): Secondary | ICD-10-CM

## 2011-11-07 DIAGNOSIS — E1029 Type 1 diabetes mellitus with other diabetic kidney complication: Secondary | ICD-10-CM

## 2011-11-07 LAB — BASIC METABOLIC PANEL
CO2: 23 mEq/L (ref 19–32)
Chloride: 104 mEq/L (ref 96–112)
Creatinine, Ser: 1.78 mg/dL — ABNORMAL HIGH (ref 0.50–1.10)
GFR calc Af Amer: 40 mL/min — ABNORMAL LOW (ref >90)
Potassium: 3.4 mEq/L — ABNORMAL LOW (ref 3.5–5.1)

## 2011-11-07 LAB — GLUCOSE, CAPILLARY: Glucose-Capillary: 62 mg/dL — ABNORMAL LOW (ref 70–99)

## 2011-11-07 MED ORDER — INSULIN ASPART 100 UNIT/ML ~~LOC~~ SOLN: 0.0000 [IU] | Freq: Three times a day (TID) | SUBCUTANEOUS | Status: DC

## 2011-11-07 MED ORDER — METOCLOPRAMIDE HCL 10 MG PO TABS: 10.0000 mg | ORAL_TABLET | Freq: Four times a day (QID) | ORAL | Status: DC | PRN

## 2011-11-07 MED ORDER — INSULIN GLARGINE 100 UNIT/ML ~~LOC~~ SOLN: 60.0000 [IU] | Freq: Every day | SUBCUTANEOUS | Status: DC

## 2011-11-07 NOTE — Discharge Summary (Signed)
Physician Discharge Summary  Patient ID: Jasmine [name] MRN: [id number] DOB/AGE: Oct 22, 1970 41 y.o.  Admit date: 11/03/2011 Discharge date: 11/07/2011  Discharge Diagnoses:  Active Problems:  Gastroenteritis  DM (diabetes mellitus), type 1, uncontrolled  DKA, type 1  Hyponatremia  HTN (hypertension)  CKD (chronic kidney disease), stage III  Hypothyroidism  Tobacco abuse   Medication List  As of 11/07/2011  7:58 AM   STOP taking these medications         hydrochlorothiazide 25 MG tablet         TAKE these medications         acetaminophen 500 MG tablet   Commonly known as: TYLENOL   Take 1,000 mg by mouth every 6 (six) hours as needed. For pain      carvedilol 25 MG tablet   Commonly known as: COREG   Take 25 mg by mouth 2 (two) times daily with a meal.      escitalopram 10 MG tablet   Commonly known as: LEXAPRO   Take 10 mg by mouth daily before breakfast.      fish oil-omega-3 fatty acids 1000 MG capsule   Take 1 g by mouth at bedtime.      insulin aspart 100 UNIT/ML injection   Commonly known as: novoLOG   Inject 0-20 Units into the skin 3 (three) times daily with meals. Based on carb count intake      insulin glargine 100 UNIT/ML injection   Commonly known as: LANTUS   Inject 60 Units into the skin at bedtime.      lamoTRIgine 200 MG tablet   Commonly known as: LAMICTAL   Take 200 mg by mouth at bedtime.      levothyroxine 88 MCG tablet   Commonly known as: SYNTHROID, LEVOTHROID   Take 88 mcg by mouth daily before breakfast.      lisinopril 20 MG tablet   Commonly known as: PRINIVIL,ZESTRIL   Take 20 mg by mouth 2 (two) times daily.      metoCLOPramide 10 MG tablet   Commonly known as: REGLAN   Take 1 tablet (10 mg total) by mouth every 6 (six) hours as needed (nausea).      mirtazapine 30 MG tablet   Commonly known as: REMERON   Take 30 mg by mouth at bedtime.      pravastatin 20 MG tablet   Commonly known as: PRAVACHOL   Take 20 mg by mouth at  bedtime.            Discharge Orders    Future Orders Please Complete By Expires   Diet Carb Modified      Diet - low sodium heart healthy      Discharge instructions      Comments:   Monitor blood glucose before meals and at bedtime   Activity as tolerated - No restrictions         Follow-up Information    Follow up with PATEL,DHAVAL, MD. Call in 1 day. (for advice regarding insulin pump)    Contact information:   [phone] [address] [place]. [place] [phone]          Disposition: 01-Home or Self Care  Discharged Condition: stable  Consults:  none  Labs:   Results for orders placed during the hospital encounter of 11/03/11 (from the past 48 hour(s))  GLUCOSE, CAPILLARY     Status: Abnormal   Collection Time   11/05/11 11:29 AM  Component Value Range Comment   Glucose-Capillary 204 (*) 70 - 99 (mg/dL)    Comment 1 Notify RN      Comment 2 Documented in Chart     BASIC METABOLIC PANEL     Status: Abnormal   Collection Time   11/05/11  1:32 PM      Component Value Range Comment   Sodium 131 (*) 135 - 145 (mEq/L)    Potassium 3.6  3.5 - 5.1 (mEq/L)    Chloride 101  96 - 112 (mEq/L)    CO2 21  19 - 32 (mEq/L)    Glucose, Bld 193 (*) 70 - 99 (mg/dL)    BUN 19  6 - 23 (mg/dL)    Creatinine, Ser 1.41 (*) 0.50 - 1.10 (mg/dL)    Calcium 8.8  8.4 - 10.5 (mg/dL)    GFR calc non Af Amer 46 (*) >90 (mL/min)    GFR calc Af Amer 54 (*) >90 (mL/min)   GLUCOSE, CAPILLARY     Status: Abnormal   Collection Time   11/05/11  4:48 PM      Component Value Range Comment   Glucose-Capillary 173 (*) 70 - 99 (mg/dL)    Comment 1 Notify RN      Comment 2 Documented in Chart     GLUCOSE, CAPILLARY     Status: Abnormal   Collection Time   11/05/11 10:07 PM      Component Value Range Comment   Glucose-Capillary 170 (*) 70 - 99 (mg/dL)   BASIC METABOLIC PANEL     Status: Abnormal   Collection Time   11/06/11  6:00 AM      Component Value Range Comment     Sodium 130 (*) 135 - 145 (mEq/L)    Potassium 3.6  3.5 - 5.1 (mEq/L)    Chloride 99  96 - 112 (mEq/L)    CO2 22  19 - 32 (mEq/L)    Glucose, Bld 229 (*) 70 - 99 (mg/dL)    BUN 14  6 - 23 (mg/dL)    Creatinine, Ser 1.33 (*) 0.50 - 1.10 (mg/dL)    Calcium 8.8  8.4 - 10.5 (mg/dL)    GFR calc non Af Amer 50 (*) >90 (mL/min)    GFR calc Af Amer 57 (*) >90 (mL/min)   CBC     Status: Normal   Collection Time   11/06/11  6:00 AM      Component Value Range Comment   WBC 10.4  4.0 - 10.5 (K/uL)    RBC 4.72  3.87 - 5.11 (MIL/uL)    Hemoglobin 14.5  12.0 - 15.0 (g/dL)    HCT 42.0  36.0 - 46.0 (%)    MCV 89.0  78.0 - 100.0 (fL)    MCH 30.7  26.0 - 34.0 (pg)    MCHC 34.5  30.0 - 36.0 (g/dL)    RDW 12.5  11.5 - 15.5 (%)    Platelets 237  150 - 400 (K/uL)   GLUCOSE, CAPILLARY     Status: Abnormal   Collection Time   11/06/11  7:31 AM      Component Value Range Comment   Glucose-Capillary 225 (*) 70 - 99 (mg/dL)   HEPATIC FUNCTION PANEL     Status: Abnormal   Collection Time   11/06/11  8:57 AM      Component Value Range Comment   Total Protein 6.3  6.0 - 8.3 (g/dL)    Albumin 3.2 (*) 3.5 - 5.2 (  g/dL)    AST 10  0 - 37 (U/L)    ALT 8  0 - 35 (U/L)    Alkaline Phosphatase 98  39 - 117 (U/L)    Total Bilirubin 0.3  0.3 - 1.2 (mg/dL)    Bilirubin, Direct 0.5 (*) 0.0 - 0.3 (mg/dL)    Indirect Bilirubin NEG 0.4  0.3 - 0.9 (mg/dL)   LIPASE, BLOOD     Status: Normal   Collection Time   11/06/11  8:57 AM      Component Value Range Comment   Lipase 24  11 - 59 (U/L)   GLUCOSE, CAPILLARY     Status: Abnormal   Collection Time   11/06/11 11:23 AM      Component Value Range Comment   Glucose-Capillary 152 (*) 70 - 99 (mg/dL)   GLUCOSE, CAPILLARY     Status: Abnormal   Collection Time   11/06/11  4:43 PM      Component Value Range Comment   Glucose-Capillary 180 (*) 70 - 99 (mg/dL)   BASIC METABOLIC PANEL     Status: Abnormal   Collection Time   11/07/11  6:55 AM      Component Value Range Comment    Sodium 135  135 - 145 (mEq/L)    Potassium 3.4 (*) 3.5 - 5.1 (mEq/L)    Chloride 104  96 - 112 (mEq/L)    CO2 23  19 - 32 (mEq/L)    Glucose, Bld 81  70 - 99 (mg/dL)    BUN 15  6 - 23 (mg/dL)    Creatinine, Ser 1.78 (*) 0.50 - 1.10 (mg/dL)    Calcium 8.2 (*) 8.4 - 10.5 (mg/dL)    GFR calc non Af Amer 35 (*) >90 (mL/min)    GFR calc Af Amer 40 (*) >90 (mL/min)   GLUCOSE, CAPILLARY     Status: Abnormal   Collection Time   11/07/11  7:50 AM      Component Value Range Comment   Glucose-Capillary 62 (*) 70 - 99 (mg/dL)     Diagnostics:  Dg Chest Port 1 View  11/03/2011  *RADIOLOGY REPORT*  Clinical Data: 41 year old female with hyperglycemia.  Diabetes, hypertension.  PORTABLE CHEST - 1 VIEW  Comparison: 06/17/2011 and earlier.  Findings: Portable upright AP view at 0937 hours.  Chronically low lung volumes.  Cardiac size and mediastinal contours are within normal limits.  Visualized tracheal air column is within normal limits.  No pneumothorax, pleural effusion or consolidation. Mildly increased above baseline pulmonary vascular congestion, but no overt edema.  IMPRESSION: Chronically low lung volumes.  Mild vascular congestion without overt edema.  Original Report Authenticated By: Randall An, M.D.   EKG:  Full Code   Hospital Course: See H&P for complete admission details. Ms. Geoffry Paradise is a pleasant 41 year old white female with type 1 diabetes who presented with intractable vomiting and diarrhea. She had been on an insulin pump and her blood sugars were running high. She was weak. The symptoms started the day prior to admission. In the emergency room, her blood pressure was high, 179/99. Otherwise normal vital signs. She was ill-appearing with dry mucous membranes poor dentition soft obese nontender abdomen. Blood glucose was 502 with a normal anion gap. Creatinine was 1.4. BUN 43. Patient had a slightly elevated lipase but no evidence of pancreatitis clinically. She had no epigastric pain or  tenderness, and it was likely related to renal insufficiency.  Patient was admitted and started on IV fluids,  IV insulin. Her insulin pump was stopped. Dr. Caryn Section had concerns about pump malfunction and has recommended intermittent subcutaneous injections of Lantus and NovoLog until she can followup with her endocrinologist. During the hospitalization, she did develop anion gap metabolic acidosis and was treated appropriately. Stool studies were ordered, but her diarrhea resolved without treatment and the tests were canceled. She did have nausea periodically which has responded well to Reglan. Her liver function tests are normal. Lipase today is normal and she is requesting discharge.  Her hydrochlorothiazide was held in the setting of dehydration and uncontrolled diabetes. Her blood pressures have been elevated and she is required IV hydralazine periodically. She has been started back on her other antihypertensives. She may need adjustment of her regimen should she continue to have uncontrolled hypertension.  Her psychiatric medications were continued during the hospitalization and she has had no problems in this arena. Total time on the day of discharge greater than 30 minutes.  Discharge Exam:  Blood pressure 117/77, pulse 86, temperature 98 F (36.7 C), temperature source Oral, resp. rate 18, height 5\' 9"  (1.753 m), weight 134.1 kg (295 lb 10.2 oz), SpO2 92.00%.  General: Asleep. Arousable. Comfortable. Lungs clear to auscultation bilaterally without wheeze rhonchi or rales Cardiovascular regular rate rhythm without murmurs gallops rubs Abdomen soft nontender nondistended Extremities no clubbing cyanosis or edema   Signed: Zak Gondek L 11/07/2011, 7:58 AM

## 2011-11-08 LAB — GLUCOSE, CAPILLARY: Glucose-Capillary: 153 mg/dL — ABNORMAL HIGH (ref 70–99)

## 2012-01-13 ENCOUNTER — Encounter (HOSPITAL_COMMUNITY): Payer: Self-pay | Admitting: Emergency Medicine

## 2012-01-13 ENCOUNTER — Emergency Department (HOSPITAL_COMMUNITY)
Admission: EM | Admit: 2012-01-13 | Discharge: 2012-01-13 | Disposition: A | Discharge status: Home / Self Care | Payer: Medicare Other | Attending: Emergency Medicine | Admitting: Emergency Medicine

## 2012-01-13 DIAGNOSIS — K219 Gastro-esophageal reflux disease without esophagitis: Secondary | ICD-10-CM | POA: Insufficient documentation

## 2012-01-13 DIAGNOSIS — I129 Hypertensive chronic kidney disease with stage 1 through stage 4 chronic kidney disease, or unspecified chronic kidney disease: Secondary | ICD-10-CM | POA: Insufficient documentation

## 2012-01-13 DIAGNOSIS — N182 Chronic kidney disease, stage 2 (mild): Secondary | ICD-10-CM | POA: Insufficient documentation

## 2012-01-13 DIAGNOSIS — IMO0002 Reserved for concepts with insufficient information to code with codable children: Secondary | ICD-10-CM | POA: Insufficient documentation

## 2012-01-13 DIAGNOSIS — F411 Generalized anxiety disorder: Secondary | ICD-10-CM | POA: Insufficient documentation

## 2012-01-13 DIAGNOSIS — F329 Major depressive disorder, single episode, unspecified: Secondary | ICD-10-CM | POA: Insufficient documentation

## 2012-01-13 DIAGNOSIS — E1165 Type 2 diabetes mellitus with hyperglycemia: Secondary | ICD-10-CM

## 2012-01-13 DIAGNOSIS — F319 Bipolar disorder, unspecified: Secondary | ICD-10-CM | POA: Insufficient documentation

## 2012-01-13 DIAGNOSIS — E039 Hypothyroidism, unspecified: Secondary | ICD-10-CM | POA: Insufficient documentation

## 2012-01-13 DIAGNOSIS — F3289 Other specified depressive episodes: Secondary | ICD-10-CM | POA: Insufficient documentation

## 2012-01-13 DIAGNOSIS — E1065 Type 1 diabetes mellitus with hyperglycemia: Secondary | ICD-10-CM | POA: Insufficient documentation

## 2012-01-13 DIAGNOSIS — Z794 Long term (current) use of insulin: Secondary | ICD-10-CM | POA: Insufficient documentation

## 2012-01-13 DIAGNOSIS — Z79899 Other long term (current) drug therapy: Secondary | ICD-10-CM | POA: Insufficient documentation

## 2012-01-13 DIAGNOSIS — E7800 Pure hypercholesterolemia, unspecified: Secondary | ICD-10-CM | POA: Insufficient documentation

## 2012-01-13 LAB — BASIC METABOLIC PANEL
BUN: 31 mg/dL — ABNORMAL HIGH (ref 6–23)
CO2: 25 mEq/L (ref 19–32)
Calcium: 8.6 mg/dL (ref 8.4–10.5)
Chloride: 98 mEq/L (ref 96–112)
Creatinine, Ser: 1.82 mg/dL — ABNORMAL HIGH (ref 0.50–1.10)
GFR calc Af Amer: 39 mL/min — ABNORMAL LOW (ref >90)
GFR calc non Af Amer: 34 mL/min — ABNORMAL LOW (ref >90)
Glucose, Bld: 582 mg/dL (ref 70–99)
Potassium: 5.4 mEq/L — ABNORMAL HIGH (ref 3.5–5.1)
Sodium: 130 mEq/L — ABNORMAL LOW (ref 135–145)

## 2012-01-13 LAB — URINALYSIS, ROUTINE W REFLEX MICROSCOPIC
Bilirubin Urine: NEGATIVE
Glucose, UA: >1000 mg/dL — AB
Hgb urine dipstick: NEGATIVE
Ketones, ur: NEGATIVE mg/dL
Leukocytes, UA: NEGATIVE
Nitrite: NEGATIVE
Protein, ur: NEGATIVE mg/dL
Specific Gravity, Urine: 1.01 (ref 1.005–1.030)
Urobilinogen, UA: 0.2 mg/dL (ref 0.0–1.0)
pH: 6.5 (ref 5.0–8.0)

## 2012-01-13 LAB — GLUCOSE, CAPILLARY
Glucose-Capillary: >600 mg/dL (ref 70–99)
Glucose-Capillary: 325 mg/dL — ABNORMAL HIGH (ref 70–99)

## 2012-01-13 LAB — CBC
HCT: 39.4 % (ref 36.0–46.0)
Hemoglobin: 12.8 g/dL (ref 12.0–15.0)
MCH: 30.5 pg (ref 26.0–34.0)
MCHC: 32.5 g/dL (ref 30.0–36.0)
MCV: 93.8 fL (ref 78.0–100.0)
Platelets: 220 10*3/uL (ref 150–400)
RBC: 4.2 MIL/uL (ref 3.87–5.11)
RDW: 12.8 % (ref 11.5–15.5)
WBC: 9.1 10*3/uL (ref 4.0–10.5)

## 2012-01-13 LAB — URINE MICROSCOPIC-ADD ON

## 2012-01-13 MED ORDER — SODIUM CHLORIDE 0.9 % IV BOLUS (SEPSIS)
1000.0000 mL | Freq: Once | INTRAVENOUS | Status: AC
  Administered 2012-01-13: 1000 mL via INTRAVENOUS

## 2012-01-13 MED ORDER — INSULIN ASPART 100 UNIT/ML IV SOLN
15.0000 [IU] | Freq: Once | INTRAVENOUS | Status: AC
  Administered 2012-01-13: 15 [IU] via INTRAVENOUS

## 2012-01-13 MED ORDER — INSULIN REGULAR HUMAN 100 UNIT/ML IJ SOLN: 15.0000 [IU] | Freq: Once | INTRAMUSCULAR | Status: DC

## 2012-01-13 NOTE — ED Provider Notes (Signed)
History    41yF with hyperglycemia. Sugar "high" this evening so came into ED. On insulin pump and reports compliance with SSI. No recent med change. No fever or chills. NO urinary complaints. Mild fatigue and nausea. No abdominal pain. No cp or sob.  CSN: [id number]  Arrival date & time 01/13/12  0109   First MD Initiated Contact with Patient 01/13/12 0111      Chief Complaint  Patient presents with  . Hyperglycemia    (Consider location/radiation/quality/duration/timing/severity/associated sxs/prior treatment) HPI  Past Medical History  Diagnosis Date  . Hypertension   . Diabetes mellitus   . Bipolar disorder   . CKD (chronic kidney disease), stage II   . Elevated cholesterol   . GERD (gastroesophageal reflux disease)   . Stress incontinence   . Hypothyroidism   . Sleep apnea     HAS C -PAP / DOES NOT USE  . Anxiety   . Depression   . UTI (lower urinary tract infection)   . Tobacco abuse   . Obesity   . DKA, type 1 11/04/2011    Past Surgical History  Procedure Date  . Ovary removed   . Nasal fracture surgery   . Uterine fibroid surgery 2001  . Pubovaginal sling 08/16/2011    Procedure: [name];  Surgeon: Bernestine Amass, MD;  Location: [facility];  Service: Urology;  Laterality: N/A;           No family history on file.  History  Substance Use Topics  . Smoking status: Current Everyday Smoker -- 1.0 packs/day  . Smokeless tobacco: Not on file  . Alcohol Use: No    OB History    Grav Para Term Preterm Abortions TAB SAB Ect Mult Living                  Review of Systems  ROS  Review of symptoms negative unless otherwise noted in HPI.    Allergies  Review of patient's allergies indicates no known allergies.  Home Medications   Current Outpatient Rx  Name Route Sig Dispense Refill  . ACETAMINOPHEN 500 MG PO TABS Oral Take 1,000 mg by mouth every 6 (six) hours as needed. For pain    . CARVEDILOL 25 MG PO TABS Oral Take 25 mg by mouth  2 (two) times daily with a meal.     . OMEGA-3 FATTY ACIDS 1000 MG PO CAPS Oral Take 1 g by mouth at bedtime.     . INSULIN ASPART 100 UNIT/ML [place] SOLN Subcutaneous Inject 0-20 Units into the skin 3 (three) times daily with meals. Based on carb count intake 1 vial   . INSULIN GLARGINE 100 UNIT/ML [place] SOLN Subcutaneous Inject 60 Units into the skin at bedtime. 10 mL   . LAMOTRIGINE 200 MG PO TABS Oral Take 200 mg by mouth at bedtime.     Marland Kitchen LEVOTHYROXINE SODIUM 88 MCG PO TABS Oral Take 88 mcg by mouth daily before breakfast.     . LISINOPRIL 20 MG PO TABS Oral Take 20 mg by mouth 2 (two) times daily.     Marland Kitchen PRAVASTATIN SODIUM 20 MG PO TABS Oral Take 20 mg by mouth at bedtime.    Marland Kitchen ESCITALOPRAM OXALATE 10 MG PO TABS Oral Take 10 mg by mouth daily before breakfast.     . METOCLOPRAMIDE HCL 10 MG PO TABS Oral Take 1 tablet (10 mg total) by mouth every 6 (six) hours as needed (nausea). 30 tablet 0  . MIRTAZAPINE  30 MG PO TABS Oral Take 30 mg by mouth at bedtime.       BP 138/77  Pulse 80  Temp 98.1 F (36.7 C) (Oral)  Resp 20  Ht 5\' 9"  (1.753 m)  Wt 284 lb (128.822 kg)  BMI 41.94 kg/m2  SpO2 97%  Physical Exam  Nursing note and vitals reviewed. Constitutional: No distress.       Laying in bed. NAD. Obese.  HENT:  Head: Normocephalic and atraumatic.  Eyes: Conjunctivae are normal. Right eye exhibits no discharge. Left eye exhibits no discharge.  Neck: Neck supple.  Cardiovascular: Normal rate, regular rhythm and normal heart sounds.  Exam reveals no gallop and no friction rub.   No murmur heard. Pulmonary/Chest: Effort normal and breath sounds normal. No respiratory distress.  Abdominal: Soft. She exhibits no distension. There is no tenderness.  Musculoskeletal: She exhibits no edema and no tenderness.  Neurological: She is alert.  Skin: Skin is warm and dry.  Psychiatric: She has a normal mood and affect. Her behavior is normal. Thought content normal.    ED Course  Procedures  (including critical care time)  Labs Reviewed  GLUCOSE, CAPILLARY - Abnormal; Notable for the following:    Glucose-Capillary >600 (*)     All other components within normal limits  BASIC METABOLIC PANEL - Abnormal; Notable for the following:    Sodium 130 (*)     Potassium 5.4 (*)     Glucose, Bld 582 (*)     BUN 31 (*)     Creatinine, Ser 1.82 (*)     GFR calc non Af Amer 34 (*)     GFR calc Af Amer 39 (*)     All other components within normal limits  URINALYSIS, ROUTINE W REFLEX MICROSCOPIC - Abnormal; Notable for the following:    Color, Urine STRAW (*)     Glucose, UA >1000 (*)     All other components within normal limits  CBC  URINE MICROSCOPIC-ADD ON   No results found.   1. Poorly controlled diabetes mellitus       MDM  41yf with hyperglycemia. Improved with IVF and insulin. No acidosis and no ketones on UA. Afebrile and nontoxic appearing. Mild change in baseline renal renal insufficiency. Suspect prerenal with GI loss. Expect to improve with IVF. No ongoing vomiting or diarrhea in ED. Feel appropriate for DC at this time, ut return precautions discussed. Outpt fu.        Virgel Manifold, MD 01/13/12 [phone]

## 2012-01-13 NOTE — ED Notes (Signed)
Patient states has had nausea and diarrhea yesterday; states blood sugar went to high to read on her machine.

## 2012-03-16 ENCOUNTER — Encounter (HOSPITAL_COMMUNITY): Payer: Self-pay

## 2012-03-16 ENCOUNTER — Emergency Department (HOSPITAL_COMMUNITY)
Admission: EM | Admit: 2012-03-16 | Discharge: 2012-03-16 | Disposition: A | Discharge status: Home / Self Care | Payer: Medicare Other | Attending: Emergency Medicine | Admitting: Emergency Medicine

## 2012-03-16 DIAGNOSIS — Z794 Long term (current) use of insulin: Secondary | ICD-10-CM | POA: Insufficient documentation

## 2012-03-16 DIAGNOSIS — N183 Chronic kidney disease, stage 3 unspecified: Secondary | ICD-10-CM | POA: Insufficient documentation

## 2012-03-16 DIAGNOSIS — I129 Hypertensive chronic kidney disease with stage 1 through stage 4 chronic kidney disease, or unspecified chronic kidney disease: Secondary | ICD-10-CM | POA: Insufficient documentation

## 2012-03-16 DIAGNOSIS — E1169 Type 2 diabetes mellitus with other specified complication: Secondary | ICD-10-CM | POA: Insufficient documentation

## 2012-03-16 DIAGNOSIS — R739 Hyperglycemia, unspecified: Secondary | ICD-10-CM

## 2012-03-16 DIAGNOSIS — R059 Cough, unspecified: Secondary | ICD-10-CM | POA: Insufficient documentation

## 2012-03-16 DIAGNOSIS — Z79899 Other long term (current) drug therapy: Secondary | ICD-10-CM | POA: Insufficient documentation

## 2012-03-16 DIAGNOSIS — R05 Cough: Secondary | ICD-10-CM | POA: Insufficient documentation

## 2012-03-16 LAB — CBC WITH DIFFERENTIAL/PLATELET
Basophils Relative: 1 % (ref 0–1)
Eosinophils Absolute: 0 10*3/uL (ref 0.0–0.7)
Eosinophils Relative: 0 % (ref 0–5)
HCT: 41 % (ref 36.0–46.0)
Hemoglobin: 14.2 g/dL (ref 12.0–15.0)
MCH: 30.4 pg (ref 26.0–34.0)
MCHC: 34.6 g/dL (ref 30.0–36.0)
MCV: 87.8 fL (ref 78.0–100.0)
Monocytes Absolute: 0.7 10*3/uL (ref 0.1–1.0)
Monocytes Relative: 6 % (ref 3–12)
Neutrophils Relative %: 72 % (ref 43–77)

## 2012-03-16 LAB — BASIC METABOLIC PANEL
BUN: 48 mg/dL — ABNORMAL HIGH (ref 6–23)
Calcium: 10.5 mg/dL (ref 8.4–10.5)
Creatinine, Ser: 2.21 mg/dL — ABNORMAL HIGH (ref 0.50–1.10)
GFR calc Af Amer: 31 mL/min — ABNORMAL LOW (ref >90)
GFR calc non Af Amer: 27 mL/min — ABNORMAL LOW (ref >90)
Glucose, Bld: 625 mg/dL (ref 70–99)

## 2012-03-16 LAB — URINE MICROSCOPIC-ADD ON

## 2012-03-16 LAB — URINALYSIS, ROUTINE W REFLEX MICROSCOPIC
Bilirubin Urine: NEGATIVE
Hgb urine dipstick: NEGATIVE
Ketones, ur: NEGATIVE mg/dL
Nitrite: NEGATIVE
Urobilinogen, UA: 0.2 mg/dL (ref 0.0–1.0)

## 2012-03-16 LAB — GLUCOSE, CAPILLARY: Glucose-Capillary: 280 mg/dL — ABNORMAL HIGH (ref 70–99)

## 2012-03-16 MED ORDER — SODIUM CHLORIDE 0.9 % IV SOLN
Freq: Once | INTRAVENOUS | Status: AC
  Administered 2012-03-16: 150 mL/h via INTRAVENOUS

## 2012-03-16 MED ORDER — SODIUM CHLORIDE 0.9 % IV BOLUS (SEPSIS)
1000.0000 mL | Freq: Once | INTRAVENOUS | Status: AC
  Administered 2012-03-16: 1000 mL via INTRAVENOUS

## 2012-03-16 MED ORDER — INSULIN ASPART 100 UNIT/ML ~~LOC~~ SOLN
10.0000 [IU] | SUBCUTANEOUS | Status: AC
  Administered 2012-03-16: 10 [IU] via INTRAVENOUS
  Filled 2012-03-16: qty 1

## 2012-03-16 NOTE — ED Notes (Signed)
CRITICAL VALUE ALERT  Critical value received: glucose 625 Date of notification: 03/16/2012  Time of notification: 1338  Critical value read back:yes  Nurse who received alert:  lrt  MD notified (1st page):  glick  Time of first page: 1342  MD notified (2nd page):  Time of second page:  Responding MD: glick Time MD responded: [id number]

## 2012-03-16 NOTE — ED Notes (Signed)
Pt was started on steroids yesterday by pmd and blood sugar has been reading high at high. Dizzy at times.

## 2012-03-16 NOTE — ED Notes (Signed)
Dr. Roxanne Mins made aware of CBG at 280. He discussed plan w/patient.

## 2012-03-16 NOTE — ED Provider Notes (Signed)
History  This chart was scribed for Delora Fuel, MD by Jenne Campus. This patient was seen in room APA17/[facility]17 and the patient's care was started at 12:04PM.  CSN: [id number]  Arrival date & time 03/16/12  1118   First MD Initiated Contact with Patient 03/16/12 1204      Chief Complaint  Patient presents with  . Hyperglycemia    The history is provided by the patient. No language interpreter was used.    Jasmine [name] is a 41 y.o. female with a h/o DM who presents to the Emergency Department complaining of non-changing, constant hyperglycemia measured in the 600s that started after she had a steroid injection for an URI and right knee pain yesterday at her PCP's office. She reports blurred vision, polydipsia, increased urinary frequency and nausea as associated symptoms. She states that she takes Lantus and Novolog for her DM and reports that she has been increasing her Novolog injections from 3 times a day to once every 2 to 3 hours with minimal improvement. She states that she still has a cough attributed to the URI and feels light-headed during coughing fits. She denies the possibility of pregnancy reporting that she has an IUD in place. She denies fever, emesis, HA and numbness as associated symptoms. She also has a h/o HTN, bipolar disorder, GERD, anxiety and depression. She is a current everyday smoker but denies alcohol use.  PCP is Dr. Deatra Ina.  Past Medical History  Diagnosis Date  . Hypertension   . Diabetes mellitus   . Bipolar disorder   . CKD (chronic kidney disease), stage II   . Elevated cholesterol   . GERD (gastroesophageal reflux disease)   . Stress incontinence   . Hypothyroidism   . Sleep apnea     HAS C -PAP / DOES NOT USE  . Anxiety   . Depression   . UTI (lower urinary tract infection)   . Tobacco abuse   . Obesity   . DKA, type 1 11/04/2011    Past Surgical History  Procedure Date  . Ovary removed   . Nasal fracture surgery   . Uterine fibroid  surgery 2001  . Pubovaginal sling 08/16/2011    Procedure: [name];  Surgeon: Bernestine Amass, MD;  Location: [facility];  Service: Urology;  Laterality: N/A;           No family history on file.  History  Substance Use Topics  . Smoking status: Current Every Day Smoker -- 1.0 packs/day    Types: Cigarettes  . Smokeless tobacco: Not on file  . Alcohol Use: No    No OB history provided.  Review of Systems  Eyes: Positive for visual disturbance. Negative for photophobia.  Respiratory: Positive for cough. Negative for shortness of breath.   Gastrointestinal: Positive for nausea. Negative for vomiting and abdominal pain.  Genitourinary: Positive for frequency. Negative for dysuria and urgency.  Neurological: Positive for dizziness.  All other systems reviewed and are negative.    Allergies  Review of patient's allergies indicates no known allergies.  Home Medications   Current Outpatient Rx  Name Route Sig Dispense Refill  . ACETAMINOPHEN 500 MG PO TABS Oral Take 1,000 mg by mouth every 6 (six) hours as needed. For pain    . CARVEDILOL 25 MG PO TABS Oral Take 25 mg by mouth 2 (two) times daily with a meal.     . ESCITALOPRAM OXALATE 10 MG PO TABS Oral Take 10 mg by mouth daily before  breakfast.     . OMEGA-3 FATTY ACIDS 1000 MG PO CAPS Oral Take 1 g by mouth at bedtime.     . INSULIN ASPART 100 UNIT/ML [place] SOLN Subcutaneous Inject 0-20 Units into the skin 3 (three) times daily with meals. Based on carb count intake 1 vial   . INSULIN GLARGINE 100 UNIT/ML [place] SOLN Subcutaneous Inject 60 Units into the skin at bedtime. 10 mL   . LAMOTRIGINE 200 MG PO TABS Oral Take 200 mg by mouth at bedtime.     Marland Kitchen LEVOTHYROXINE SODIUM 88 MCG PO TABS Oral Take 88 mcg by mouth daily before breakfast.     . LISINOPRIL 20 MG PO TABS Oral Take 20 mg by mouth 2 (two) times daily.     Marland Kitchen METOCLOPRAMIDE HCL 10 MG PO TABS Oral Take 1 tablet (10 mg total) by mouth every 6 (six) hours as needed  (nausea). 30 tablet 0  . MIRTAZAPINE 30 MG PO TABS Oral Take 30 mg by mouth at bedtime.     Marland Kitchen PRAVASTATIN SODIUM 20 MG PO TABS Oral Take 20 mg by mouth at bedtime.      Triage Vitals: BP 101/78  Pulse 83  Temp 98.2 F (36.8 C) (Oral)  Resp 20  Ht 5\' 9"  (1.753 m)  Wt 290 lb (131.543 kg)  BMI 42.83 kg/m2  SpO2 83%  Physical Exam  Nursing note and vitals reviewed. Constitutional: She is oriented to person, place, and time. She appears well-developed and well-nourished. No distress.  HENT:  Head: Normocephalic and atraumatic.  Eyes: Conjunctivae normal and EOM are normal.  Neck: Neck supple. No tracheal deviation present.  Cardiovascular: Normal rate and regular rhythm.   No murmur heard. Pulmonary/Chest: Effort normal and breath sounds normal. No respiratory distress.  Abdominal: Soft. She exhibits no distension. There is no tenderness.  Musculoskeletal: Normal range of motion.  Neurological: She is alert and oriented to person, place, and time.  Skin: Skin is warm and dry.  Psychiatric: She has a normal mood and affect. Her behavior is normal.    ED Course  Procedures (including critical care time)  DIAGNOSTIC STUDIES: Oxygen Saturation is 83% on room air, low by my interpretation. This is felt to be an error. Oxygen saturation was repeated and was 98% on room air, normal by my interpretation    COORDINATION OF CARE: 12:10PM-Discussed treatment plan which includes blood work, UA and glucose recheck with pt at bedside and pt agreed to plan.  12:15PM-Ordered 1000 mL of Bolus.  Results for orders placed during the hospital encounter of 03/16/12  GLUCOSE, CAPILLARY      Component Value Range   Glucose-Capillary >600 (*) 70 - 99 mg/dL  CBC WITH DIFFERENTIAL      Component Value Range   WBC 12.0 (*) 4.0 - 10.5 K/uL   RBC 4.67  3.87 - 5.11 MIL/uL   Hemoglobin 14.2  12.0 - 15.0 g/dL   HCT 41.0  36.0 - 46.0 %   MCV 87.8  78.0 - 100.0 fL   MCH 30.4  26.0 - 34.0 pg   MCHC  34.6  30.0 - 36.0 g/dL   RDW 12.5  11.5 - 15.5 %   Platelets 245  150 - 400 K/uL   Neutrophils Relative 72  43 - 77 %   Neutro Abs 8.7 (*) 1.7 - 7.7 K/uL   Lymphocytes Relative 22  12 - 46 %   Lymphs Abs 2.6  0.7 - 4.0 K/uL   Monocytes Relative  6  3 - 12 %   Monocytes Absolute 0.7  0.1 - 1.0 K/uL   Eosinophils Relative 0  0 - 5 %   Eosinophils Absolute 0.0  0.0 - 0.7 K/uL   Basophils Relative 1  0 - 1 %   Basophils Absolute 0.1  0.0 - 0.1 K/uL  BASIC METABOLIC PANEL      Component Value Range   Sodium 125 (*) 135 - 145 mEq/L   Potassium 4.5  3.5 - 5.1 mEq/L   Chloride 88 (*) 96 - 112 mEq/L   CO2 23  19 - 32 mEq/L   Glucose, Bld 625 (*) 70 - 99 mg/dL   BUN 48 (*) 6 - 23 mg/dL   Creatinine, Ser 2.21 (*) 0.50 - 1.10 mg/dL   Calcium 10.5  8.4 - 10.5 mg/dL   GFR calc non Af Amer 27 (*) >90 mL/min   GFR calc Af Amer 31 (*) >90 mL/min  URINALYSIS, ROUTINE W REFLEX MICROSCOPIC      Component Value Range   Color, Urine YELLOW  YELLOW   APPearance CLEAR  CLEAR   Specific Gravity, Urine <1.005 (*) 1.005 - 1.030   pH 5.5  5.0 - 8.0   Glucose, UA >1000 (*) NEGATIVE mg/dL   Hgb urine dipstick NEGATIVE  NEGATIVE   Bilirubin Urine NEGATIVE  NEGATIVE   Ketones, ur NEGATIVE  NEGATIVE mg/dL   Protein, ur NEGATIVE  NEGATIVE mg/dL   Urobilinogen, UA 0.2  0.0 - 1.0 mg/dL   Nitrite NEGATIVE  NEGATIVE   Leukocytes, UA NEGATIVE  NEGATIVE  PREGNANCY, URINE      Component Value Range   Preg Test, Ur NEGATIVE  NEGATIVE  URINE MICROSCOPIC-ADD ON      Component Value Range   Squamous Epithelial / LPF MANY (*) RARE   WBC, UA 0-2  <3 WBC/hpf   RBC / HPF 0-2  <3 RBC/hpf   Bacteria, UA FEW (*) RARE   Urine-Other YEAST    GLUCOSE, CAPILLARY      Component Value Range   Glucose-Capillary 390 (*) 70 - 99 mg/dL  GLUCOSE, CAPILLARY      Component Value Range   Glucose-Capillary 280 (*) 70 - 99 mg/dL    1. Hyperglycemia       MDM  Hyperglycemia which is most likely related to corticosteroid  injection. His injection was of dexamethasone, which is probably going to last for about 4 days. In the ED, she will be given IV fluids and IV insulin. She will need to adjust her drip rate on her insulin pump to account for the steroid effect.  Blood sugars come down to under 300 and, and she is ready for discharge. She is to contact her PCP for instructions on how to make adjustments to her insulin pump. This flap be done carefully as the steroid effect we'll gradually declined over the next several days.   I personally performed the services described in this documentation, which was scribed in my presence. The recorded information has been reviewed and considered.      Delora Fuel, MD [id number]

## 2012-03-16 NOTE — ED Notes (Signed)
Patient recv'd. Steroid injection yesterday at PMD for knee pain and URI.  Started on Doxycylcine.  Last PM, CBG was >400 which she treated w/bolus injection from her insulin pump.  This AM CBG was reading HIGH, which prompted ER visit.

## 2012-03-16 NOTE — ED Notes (Signed)
Patient with no complaints at this time. Respirations even and unlabored. Skin warm/dry. Discharge instructions reviewed with patient at this time. Patient given opportunity to voice concerns/ask questions. IV removed per policy and band-aid applied to site. Patient discharged at this time and left Emergency Department with steady gait.  

## 2012-03-21 ENCOUNTER — Other Ambulatory Visit: Payer: Self-pay | Admitting: Family Medicine

## 2012-03-21 DIAGNOSIS — R0602 Shortness of breath: Secondary | ICD-10-CM

## 2012-03-21 DIAGNOSIS — R9389 Abnormal findings on diagnostic imaging of other specified body structures: Secondary | ICD-10-CM

## 2012-03-28 ENCOUNTER — Ambulatory Visit
Admission: RE | Admit: 2012-03-28 | Discharge: 2012-03-28 | Disposition: A | Discharge status: Home / Self Care | Payer: Medicare Other | Source: Ambulatory Visit | Attending: Family Medicine | Admitting: Family Medicine

## 2012-03-28 DIAGNOSIS — R9389 Abnormal findings on diagnostic imaging of other specified body structures: Secondary | ICD-10-CM

## 2012-03-28 DIAGNOSIS — R0602 Shortness of breath: Secondary | ICD-10-CM

## 2012-05-01 LAB — PULMONARY FUNCTION TEST

## 2012-08-14 ENCOUNTER — Other Ambulatory Visit (INDEPENDENT_AMBULATORY_CARE_PROVIDER_SITE_OTHER): Payer: Medicare Other

## 2012-08-14 ENCOUNTER — Encounter: Payer: Self-pay | Admitting: Internal Medicine

## 2012-08-14 ENCOUNTER — Ambulatory Visit (INDEPENDENT_AMBULATORY_CARE_PROVIDER_SITE_OTHER): Payer: Medicare Other | Admitting: Internal Medicine

## 2012-08-14 VITALS — BP 126/84 | HR 68 | Temp 97.6°F | Ht 69.5 in | Wt 299.4 lb

## 2012-08-14 DIAGNOSIS — J849 Interstitial pulmonary disease, unspecified: Secondary | ICD-10-CM

## 2012-08-14 DIAGNOSIS — J8410 Pulmonary fibrosis, unspecified: Secondary | ICD-10-CM

## 2012-08-14 LAB — CK: Total CK: 57 U/L (ref 7–177)

## 2012-08-14 LAB — SEDIMENTATION RATE: Sed Rate: 58 mm/hr — ABNORMAL HIGH (ref 0–22)

## 2012-08-14 NOTE — Patient Instructions (Addendum)
Please do blood work I will talk to our radiologist and compare the 2 scans After above I will call you and update you; might need PET CT scan based on aboe results

## 2012-08-14 NOTE — Progress Notes (Signed)
Subjective:    Patient ID: Jasmine [name], female    DOB: [birth year], 42 y.o.   MRN: [id number]  HPI PCP is Tula Nakayama   IOV 08/14/2012  42 year old female.  Smoker. Body mass index is 43.59 kg/(m^2). Type 1 Diabetic on insulin pump. OSA on CPAP [apnea hypotony index of 19.9 on sleep study 04/27/2012. BiPAP titration and 18/14 showed most benefit of his history of noncompliance]. Bipolar (normally takes father for medical visits to help understand what is going on)   Reports that in oct 2013: went to [facility] due to knee pain. PMD heard her wheeze. CXR showed abnormality. Referred to Dr Feliz Beam (pulmonary at [facility]) who initially said asthmatic bronchitis. Had PFTs which patient recollects as normal. This was followed by CT chest Oct ([facility]) and Nov 2013 ([place]). Per review of outside records a bronchoscopy and transbronchial biopsies was considered as the first step but due to concern of potential lymphoma in the differential diagnosis she was referred to thoracic surgery at [facility]. Referred to [facility] to Dr Hilda Blades. In Jan 2014: was due to have what sounds like VATS robotic lung biopsy by Dr Lianne Moris but due to high sugars kept in ICU and procedure canceled. . Of note only test she recolets is ESR 41 by PMD.  Review of outside records obtained in my hands after patient left the office shows differential diagnosis of sarcoidosis versus hypersensitivity pneumonitis. Lymphoma was entertained by the patient.   Patient currently self admittedly overwhelmed by above and difficulty concentrating and grasping even at baseline due to health issues. She is now seeing second opinion. She is upset with Dr Feliz Beam esp after he labeled as her type 2 DM.  Also, she is very SCARED about having open biopsy due to risks and what she perceives as poor explanation.   Currently  Cough x 1 week but denies chronic wheeze or dyspnea or chest pains or cough or rash. Only systemic  issue is wrist pain OA due to typing. She is on symbicort which she does not see as heplping   Of note, I only noted that after she left the office and gave blood work at our lab that her ANA, and, CBC, ESR, fungal antibodies, IgE, hypersensitivity pneumonitis panel, serum ACE level were actually normal on 04/07/2012 at [facility]  Pulmonary function testing dated 05/01/2012 FEV1 2.8 L/82%. FVC 2.3 L/79%. Total lung capacity 4.75 L 82%. DLCO of 18.7 per/55%  Exposure   reports that she has been smoking Cigarettes.  She has a 20 pack-year smoking history. She does not have any smokeless tobacco history on file.   CT chest 03/28/12  CT CHEST WITHOUT CONTRAST  Technique: Multidetector CT imaging of the chest was performed  following the standard protocol without IV contrast.  Comparison: Plain films of 11/03/2011 and 06/17/2011. No prior  CTs.  Findings: Lung windows demonstrate peribronchovascular nodularity  which is slightly greater on the right. The well-defined nodule is  in the peribronchovascular left upper lobe at 9 mm on image 14. No  definite cranial caudal gradient. 18. Thickening of the right  major fissure secondary to nodularity on image 23.  Soft tissue windows demonstrate calcified right-sided thyroid  nodule which measures 6 mm and is nonspecific. No axillary  adenopathy. Heart size upper normal, without pericardial or  pleural effusion.  Right paratracheal 1.2 cm node on image 16.  Subcarinal/azygo-esophageal recess 1.9 cm node on image 25. Hilar  regions are poorly evaluated without contrast. There is however,  bilateral soft tissue fullness, most consistent with adenopathy.  Prevascular node measures 1.4 cm on image 17.  Limited abdominal imaging demonstrates no significant findings. No  acute osseous abnormality. Deformity of the sternal body is likely  related to remote trauma.  IMPRESSION:  Thoracic adenopathy with concurrent  diffuse peribronchovascular  nodularity. Favor sarcoidosis. Other granulomatous processes,  such as atypical/mycobacterial infection are felt less likely.  Lymphoproliferative process could cause the thoracic adenopathy,  but would not typically cause the pulmonary parenchymal findings.  CT followup could be performed at 3 months to confirm stability of  the adenopathy.  Original Report Authenticated By: Areta Haber, M.D    Past Medical History  Diagnosis Date  . Hypertension   . Diabetes mellitus   . Bipolar disorder   . CKD (chronic kidney disease), stage II   . Elevated cholesterol   . GERD (gastroesophageal reflux disease)   . Stress incontinence   . Hypothyroidism   . Sleep apnea     HAS C -PAP / DOES NOT USE  . Anxiety   . Depression   . UTI (lower urinary tract infection)   . Tobacco abuse   . Obesity   . DKA, type 1 11/04/2011     Family History  Problem Relation Age of Onset  . Asthma Mother   . Heart disease Father   . Cancer Paternal Grandmother     lung and breast  . Bladder Cancer Paternal Grandfather      History   Social History  . Marital Status: Divorced    Spouse Name: N/A    Number of Children: N/A  . Years of Education: N/A   Occupational History  . Not on file.   Social History Main Topics  . Smoking status: Current Every Day Smoker -- 1.00 packs/day for 20 years    Types: Cigarettes  . Smokeless tobacco: Not on file  . Alcohol Use: No  . Drug Use: No  . Sexually Active: Yes    Birth Control/ Protection: IUD   Other Topics Concern  . Not on file   Social History Narrative  . No narrative on file     Allergies  Allergen Reactions  . Advair Diskus (Fluticasone-Salmeterol)     thrush  . Biaxin (Clarithromycin)      Outpatient Prescriptions Prior to Visit  Medication Sig Dispense Refill  . acetaminophen (TYLENOL) 500 MG tablet Take 1,000 mg by mouth every 6 (six) hours as needed. For pain      . aspirin EC 81 MG tablet  Take 81 mg by mouth daily.      . carvedilol (COREG) 25 MG tablet Take 25 mg by mouth 2 (two) times daily with a meal.       . cholecalciferol (VITAMIN D) 1000 UNITS tablet Take 1,000 Units by mouth daily.      . clonazePAM (KLONOPIN) 1 MG tablet Take 1 mg by mouth at bedtime.      . Flaxseed, Linseed, (FLAX SEED OIL) 1000 MG CAPS Take 1 capsule by mouth daily.      . folic acid (FOLVITE) Q000111Q MCG tablet Take 800 mcg by mouth daily.      . furosemide (LASIX) 40 MG tablet Take 40 mg by mouth daily as needed.       . hydrochlorothiazide (HYDRODIURIL) 25 MG tablet Take 25 mg by mouth daily.      Marland Kitchen lamoTRIgine (LAMICTAL) 200 MG tablet  Take 200 mg by mouth at bedtime.       Marland Kitchen lisinopril (PRINIVIL,ZESTRIL) 20 MG tablet Take 20 mg by mouth 2 (two) times daily.       . metoCLOPramide (REGLAN) 10 MG tablet Take 1 tablet (10 mg total) by mouth every 6 (six) hours as needed (nausea).  30 tablet  0  . mirtazapine (REMERON) 45 MG tablet Take 45 mg by mouth at bedtime.      . pravastatin (PRAVACHOL) 80 MG tablet Take 80 mg by mouth at bedtime.      Marland Kitchen Lysine 500 MG CAPS Take 1 capsule by mouth daily.      . insulin aspart (NOVOLOG) 100 UNIT/ML injection Inject 53 Units into the skin daily. Based on carb count intake      . levothyroxine (SYNTHROID, LEVOTHROID) 88 MCG tablet Take 88 mcg by mouth daily before breakfast.        No facility-administered medications prior to visit.      Review of Systems  Constitutional: Negative for fever and unexpected weight change.  HENT: Positive for ear pain, congestion and sneezing. Negative for nosebleeds, sore throat, rhinorrhea, trouble swallowing, dental problem, postnasal drip and sinus pressure.   Eyes: Negative for redness and itching.  Respiratory: Positive for cough. Negative for chest tightness, shortness of breath and wheezing.   Cardiovascular: Positive for leg swelling. Negative for palpitations.  Gastrointestinal: Negative for nausea and vomiting.   Genitourinary: Negative for dysuria.  Musculoskeletal: Negative for joint swelling.  Skin: Negative for rash.  Neurological: Negative for headaches.  Hematological: Does not bruise/bleed easily.  Psychiatric/Behavioral: Positive for dysphoric mood. The patient is nervous/anxious.        Objective:   Physical Exam  Vitals reviewed. Constitutional: She is oriented to person, place, and time. She appears well-developed and well-nourished. No distress.  HENT:  Head: Normocephalic and atraumatic.  Right Ear: External ear normal.  Left Ear: External ear normal.  Mouth/Throat: Oropharynx is clear and moist. No oropharyngeal exudate.  Eyes: Conjunctivae and EOM are normal. Pupils are equal, round, and reactive to light. Right eye exhibits no discharge. Left eye exhibits no discharge. No scleral icterus.  Neck: Normal range of motion. Neck supple. No JVD present. No tracheal deviation present. No thyromegaly present.  Cardiovascular: Normal rate, regular rhythm, normal heart sounds and intact distal pulses.  Exam reveals no gallop and no friction rub.   No murmur heard. Pulmonary/Chest: Effort normal. No respiratory distress. She has no wheezes. She has rales. She exhibits no tenderness.  Abdominal: Soft. Bowel sounds are normal. She exhibits no distension and no mass. There is no tenderness. There is no rebound and no guarding.  Musculoskeletal: Normal range of motion. She exhibits no edema and no tenderness.  Lymphadenopathy:    She has no cervical adenopathy.  Neurological: She is alert and oriented to person, place, and time. She has normal reflexes. No cranial nerve deficit. She exhibits normal muscle tone. Coordination normal.  Skin: Skin is warm and dry. No rash noted. She is not diaphoretic. No erythema. No pallor.  Psychiatric: She has a normal mood and affect. Her behavior is normal. Judgment and thought content normal.          Assessment & Plan:

## 2012-08-15 LAB — ANCA SCREEN W REFLEX TITER: c-ANCA Screen: NEGATIVE

## 2012-08-15 LAB — CYCLIC CITRUL PEPTIDE ANTIBODY, IGG: Cyclic Citrullin Peptide Ab: 0 U/mL (ref 0.0–5.0)

## 2012-08-15 LAB — RHEUMATOID FACTOR: Rhuematoid fact SerPl-aCnc: <10 IU/mL (ref <=14)

## 2012-08-15 LAB — ANGIOTENSIN CONVERTING ENZYME: Angiotensin-Converting Enzyme: 1 U/L — ABNORMAL LOW (ref 8–52)

## 2012-08-15 NOTE — Assessment & Plan Note (Addendum)
Differential diagnosis appears to be sarcoidosis or hypersensitivity pneumonitis. Lymphomas lower the differential diagnosis. I ordered autoimmune lab tests and ACE level based on this patient's history that these blood tests were never done. However outside records after we did blood draw indicates that these tests were done and they were negative. Assuming that it will be negative even at our lab, I will have our local radiologist compare her 2 scans. After which I will advise patient the likely next step which will involve bronchoscopy with lavage/transbronchial biopsies with endobronchial ultrasound biopsy  She verbalized understanding to the above plan

## 2012-08-21 LAB — HYPERSENSITIVITY PNUEMONITIS PROFILE

## 2012-08-24 ENCOUNTER — Telehealth: Payer: Self-pay | Admitting: Internal Medicine

## 2012-08-24 DIAGNOSIS — R911 Solitary pulmonary nodule: Secondary | ICD-10-CM

## 2012-08-24 DIAGNOSIS — R590 Localized enlarged lymph nodes: Secondary | ICD-10-CM

## 2012-08-24 DIAGNOSIS — F17200 Nicotine dependence, unspecified, uncomplicated: Secondary | ICD-10-CM

## 2012-08-24 NOTE — Telephone Encounter (Signed)
Dr. Chase Caller did you call this patient? I do not see anything in the chart.

## 2012-08-28 NOTE — Telephone Encounter (Signed)
Spoke to patinet. She knows she needs repeat imaging. Ordered pet scan for nodules/mediastinal nodes; it is not letting me order. Please have Charles Mix order. Might need to do peer to peer  Dr. Brand Males, M.D., [facility].C.P Pulmonary and Critical Care Medicine Staff Physician [facility] Pager: [phone], If no answer or between  15:00h - 7:00h: call [phone]  08/28/2012 4:11 PM

## 2012-08-28 NOTE — Telephone Encounter (Signed)
PT is returning triage's call.  Satira Anis

## 2012-08-28 NOTE — Telephone Encounter (Signed)
Per Anderson Malta, MR did speak with patient on 3.20.14 Triage was not aware Will forward to MR for documentation

## 2012-08-28 NOTE — Telephone Encounter (Signed)
ATC patient, no answer [id number] 

## 2012-08-29 NOTE — Telephone Encounter (Signed)
Order placed and sent to [facility]. Lanitra Bobak Oguinn, CMA  

## 2012-08-30 ENCOUNTER — Telehealth: Payer: Self-pay | Admitting: Internal Medicine

## 2012-08-30 NOTE — Telephone Encounter (Signed)
Pt instructed where to go at [facility] for this pet scan Joellen Jersey

## 2012-09-01 ENCOUNTER — Inpatient Hospital Stay (HOSPITAL_COMMUNITY): Payer: Medicare Other

## 2012-09-01 ENCOUNTER — Inpatient Hospital Stay (HOSPITAL_COMMUNITY)
Admission: EM | Admit: 2012-09-01 | Discharge: 2012-09-02 | DRG: 638 | Disposition: A | Discharge status: Home / Self Care | Payer: Medicare Other | Attending: Internal Medicine | Admitting: Internal Medicine

## 2012-09-01 ENCOUNTER — Encounter (HOSPITAL_COMMUNITY): Payer: Self-pay

## 2012-09-01 DIAGNOSIS — N182 Chronic kidney disease, stage 2 (mild): Secondary | ICD-10-CM | POA: Diagnosis present

## 2012-09-01 DIAGNOSIS — N393 Stress incontinence (female) (male): Secondary | ICD-10-CM

## 2012-09-01 DIAGNOSIS — Z720 Tobacco use: Secondary | ICD-10-CM | POA: Diagnosis present

## 2012-09-01 DIAGNOSIS — K529 Noninfective gastroenteritis and colitis, unspecified: Secondary | ICD-10-CM

## 2012-09-01 DIAGNOSIS — Z803 Family history of malignant neoplasm of breast: Secondary | ICD-10-CM

## 2012-09-01 DIAGNOSIS — R748 Abnormal levels of other serum enzymes: Secondary | ICD-10-CM

## 2012-09-01 DIAGNOSIS — I129 Hypertensive chronic kidney disease with stage 1 through stage 4 chronic kidney disease, or unspecified chronic kidney disease: Secondary | ICD-10-CM | POA: Diagnosis present

## 2012-09-01 DIAGNOSIS — E1165 Type 2 diabetes mellitus with hyperglycemia: Secondary | ICD-10-CM | POA: Diagnosis present

## 2012-09-01 DIAGNOSIS — E1065 Type 1 diabetes mellitus with hyperglycemia: Secondary | ICD-10-CM

## 2012-09-01 DIAGNOSIS — J849 Interstitial pulmonary disease, unspecified: Secondary | ICD-10-CM

## 2012-09-01 DIAGNOSIS — F411 Generalized anxiety disorder: Secondary | ICD-10-CM | POA: Diagnosis present

## 2012-09-01 DIAGNOSIS — E1010 Type 1 diabetes mellitus with ketoacidosis without coma: Secondary | ICD-10-CM

## 2012-09-01 DIAGNOSIS — K219 Gastro-esophageal reflux disease without esophagitis: Secondary | ICD-10-CM | POA: Diagnosis present

## 2012-09-01 DIAGNOSIS — E875 Hyperkalemia: Secondary | ICD-10-CM | POA: Diagnosis present

## 2012-09-01 DIAGNOSIS — E039 Hypothyroidism, unspecified: Secondary | ICD-10-CM | POA: Diagnosis present

## 2012-09-01 DIAGNOSIS — F17200 Nicotine dependence, unspecified, uncomplicated: Secondary | ICD-10-CM | POA: Diagnosis present

## 2012-09-01 DIAGNOSIS — F319 Bipolar disorder, unspecified: Secondary | ICD-10-CM | POA: Diagnosis present

## 2012-09-01 DIAGNOSIS — Z8052 Family history of malignant neoplasm of bladder: Secondary | ICD-10-CM

## 2012-09-01 DIAGNOSIS — G4733 Obstructive sleep apnea (adult) (pediatric): Secondary | ICD-10-CM | POA: Diagnosis present

## 2012-09-01 DIAGNOSIS — Z8249 Family history of ischemic heart disease and other diseases of the circulatory system: Secondary | ICD-10-CM

## 2012-09-01 DIAGNOSIS — E7800 Pure hypercholesterolemia, unspecified: Secondary | ICD-10-CM | POA: Diagnosis present

## 2012-09-01 DIAGNOSIS — R109 Unspecified abdominal pain: Secondary | ICD-10-CM | POA: Diagnosis present

## 2012-09-01 DIAGNOSIS — I10 Essential (primary) hypertension: Secondary | ICD-10-CM

## 2012-09-01 DIAGNOSIS — E669 Obesity, unspecified: Secondary | ICD-10-CM | POA: Diagnosis present

## 2012-09-01 DIAGNOSIS — Z79899 Other long term (current) drug therapy: Secondary | ICD-10-CM

## 2012-09-01 DIAGNOSIS — Z6841 Body Mass Index (BMI) 40.0 and over, adult: Secondary | ICD-10-CM

## 2012-09-01 DIAGNOSIS — Z91199 Patient's noncompliance with other medical treatment and regimen due to unspecified reason: Secondary | ICD-10-CM

## 2012-09-01 DIAGNOSIS — R1031 Right lower quadrant pain: Secondary | ICD-10-CM

## 2012-09-01 DIAGNOSIS — Z794 Long term (current) use of insulin: Secondary | ICD-10-CM

## 2012-09-01 DIAGNOSIS — IMO0002 Reserved for concepts with insufficient information to code with codable children: Secondary | ICD-10-CM

## 2012-09-01 DIAGNOSIS — E871 Hypo-osmolality and hyponatremia: Secondary | ICD-10-CM | POA: Diagnosis present

## 2012-09-01 DIAGNOSIS — Z9119 Patient's noncompliance with other medical treatment and regimen: Secondary | ICD-10-CM

## 2012-09-01 DIAGNOSIS — Z825 Family history of asthma and other chronic lower respiratory diseases: Secondary | ICD-10-CM

## 2012-09-01 LAB — HEMOGLOBIN A1C
Hgb A1c MFr Bld: 10.2 % — ABNORMAL HIGH (ref <5.7)
Mean Plasma Glucose: 246 mg/dL — ABNORMAL HIGH (ref <117)

## 2012-09-01 LAB — BASIC METABOLIC PANEL
BUN: 29 mg/dL — ABNORMAL HIGH (ref 6–23)
BUN: 31 mg/dL — ABNORMAL HIGH (ref 6–23)
BUN: 31 mg/dL — ABNORMAL HIGH (ref 6–23)
BUN: 30 mg/dL — ABNORMAL HIGH (ref 6–23)
CO2: 15 mEq/L — ABNORMAL LOW (ref 19–32)
CO2: 20 mEq/L (ref 19–32)
CO2: 22 mEq/L (ref 19–32)
Calcium: 8.7 mg/dL (ref 8.4–10.5)
Calcium: 8.9 mg/dL (ref 8.4–10.5)
Calcium: 8.7 mg/dL (ref 8.4–10.5)
Calcium: 8.7 mg/dL (ref 8.4–10.5)
Calcium: 8.8 mg/dL (ref 8.4–10.5)
Chloride: 101 mEq/L (ref 96–112)
Creatinine, Ser: 1.81 mg/dL — ABNORMAL HIGH (ref 0.50–1.10)
Creatinine, Ser: 2.03 mg/dL — ABNORMAL HIGH (ref 0.50–1.10)
Creatinine, Ser: 2.01 mg/dL — ABNORMAL HIGH (ref 0.50–1.10)
Creatinine, Ser: 2.06 mg/dL — ABNORMAL HIGH (ref 0.50–1.10)
Creatinine, Ser: 1.94 mg/dL — ABNORMAL HIGH (ref 0.50–1.10)
GFR calc Af Amer: 34 mL/min — ABNORMAL LOW (ref >90)
GFR calc Af Amer: 35 mL/min — ABNORMAL LOW (ref >90)
GFR calc non Af Amer: 34 mL/min — ABNORMAL LOW (ref >90)
GFR calc non Af Amer: 30 mL/min — ABNORMAL LOW (ref >90)
GFR calc non Af Amer: 29 mL/min — ABNORMAL LOW (ref >90)
Glucose, Bld: 592 mg/dL (ref 70–99)
Glucose, Bld: 466 mg/dL — ABNORMAL HIGH (ref 70–99)
Glucose, Bld: 230 mg/dL — ABNORMAL HIGH (ref 70–99)
Sodium: 126 mEq/L — ABNORMAL LOW (ref 135–145)
Sodium: 131 mEq/L — ABNORMAL LOW (ref 135–145)

## 2012-09-01 LAB — GLUCOSE, CAPILLARY
Glucose-Capillary: 583 mg/dL (ref 70–99)
Glucose-Capillary: 483 mg/dL — ABNORMAL HIGH (ref 70–99)
Glucose-Capillary: 237 mg/dL — ABNORMAL HIGH (ref 70–99)
Glucose-Capillary: 125 mg/dL — ABNORMAL HIGH (ref 70–99)
Glucose-Capillary: 327 mg/dL — ABNORMAL HIGH (ref 70–99)

## 2012-09-01 LAB — BLOOD GAS, ARTERIAL
Acid-Base Excess: 8.4 mmol/L — ABNORMAL HIGH (ref 0.0–2.0)
Drawn by: 22223
FIO2: 21 %
TCO2: 15.4 mmol/L (ref 0–100)
pCO2 arterial: 35.4 mmHg (ref 35.0–45.0)
pH, Arterial: 7.299 — ABNORMAL LOW (ref 7.350–7.450)
pO2, Arterial: 67.9 mmHg — ABNORMAL LOW (ref 80.0–100.0)

## 2012-09-01 LAB — CBC WITH DIFFERENTIAL/PLATELET
Basophils Absolute: 0.1 10*3/uL (ref 0.0–0.1)
Basophils Relative: 1 % (ref 0–1)
Eosinophils Absolute: 0.3 10*3/uL (ref 0.0–0.7)
Eosinophils Relative: 3 % (ref 0–5)
HCT: 40.2 % (ref 36.0–46.0)
MCHC: 33.8 g/dL (ref 30.0–36.0)
MCV: 90.7 fL (ref 78.0–100.0)
Monocytes Absolute: 0.7 10*3/uL (ref 0.1–1.0)
Platelets: 252 10*3/uL (ref 150–400)
RDW: 12.5 % (ref 11.5–15.5)

## 2012-09-01 LAB — URINALYSIS, ROUTINE W REFLEX MICROSCOPIC
Leukocytes, UA: NEGATIVE
Protein, ur: NEGATIVE mg/dL
Specific Gravity, Urine: <1.005 — ABNORMAL LOW (ref 1.005–1.030)
Urobilinogen, UA: 0.2 mg/dL (ref 0.0–1.0)
pH: 5.5 (ref 5.0–8.0)

## 2012-09-01 LAB — COMPREHENSIVE METABOLIC PANEL
ALT: 7 U/L (ref 0–35)
AST: 8 U/L (ref 0–37)
Albumin: 3.4 g/dL — ABNORMAL LOW (ref 3.5–5.2)
CO2: 19 mEq/L (ref 19–32)
Calcium: 9.1 mg/dL (ref 8.4–10.5)
Chloride: 89 mEq/L — ABNORMAL LOW (ref 96–112)
Creatinine, Ser: 1.86 mg/dL — ABNORMAL HIGH (ref 0.50–1.10)
GFR calc non Af Amer: 33 mL/min — ABNORMAL LOW (ref >90)
Sodium: 123 mEq/L — ABNORMAL LOW (ref 135–145)
Total Protein: 6.6 g/dL (ref 6.0–8.3)

## 2012-09-01 LAB — URINE MICROSCOPIC-ADD ON

## 2012-09-01 LAB — PREGNANCY, URINE: Preg Test, Ur: NEGATIVE

## 2012-09-01 MED ORDER — CLONAZEPAM 0.5 MG PO TABS
1.0000 mg | ORAL_TABLET | Freq: Every day | ORAL | Status: DC
  Administered 2012-09-01: 1 mg via ORAL
  Filled 2012-09-01: qty 2

## 2012-09-01 MED ORDER — HEPARIN SODIUM (PORCINE) 5000 UNIT/ML IJ SOLN
5000.0000 [IU] | Freq: Three times a day (TID) | INTRAMUSCULAR | Status: DC
  Administered 2012-09-01 – 2012-09-02 (×4): 5000 [IU] via SUBCUTANEOUS
  Filled 2012-09-01 (×4): qty 1

## 2012-09-01 MED ORDER — DEXTROSE 50 % IV SOLN: 25.0000 mL | INTRAVENOUS | Status: DC | PRN

## 2012-09-01 MED ORDER — NICOTINE 14 MG/24HR TD PT24
14.0000 mg | MEDICATED_PATCH | Freq: Every day | TRANSDERMAL | Status: DC
  Filled 2012-09-01: qty 1

## 2012-09-01 MED ORDER — SODIUM CHLORIDE 0.9 % IJ SOLN: 3.0000 mL | Freq: Two times a day (BID) | INTRAMUSCULAR | Status: DC

## 2012-09-01 MED ORDER — BUDESONIDE-FORMOTEROL FUMARATE 160-4.5 MCG/ACT IN AERO
2.0000 | INHALATION_SPRAY | Freq: Two times a day (BID) | RESPIRATORY_TRACT | Status: DC
  Filled 2012-09-01: qty 6

## 2012-09-01 MED ORDER — ALUM & MAG HYDROXIDE-SIMETH 200-200-20 MG/5ML PO SUSP: 30.0000 mL | Freq: Four times a day (QID) | ORAL | Status: DC | PRN

## 2012-09-01 MED ORDER — LEVOTHYROXINE SODIUM 100 MCG PO TABS
100.0000 ug | ORAL_TABLET | Freq: Every day | ORAL | Status: DC
  Administered 2012-09-01 – 2012-09-02 (×2): 100 ug via ORAL
  Filled 2012-09-01 (×2): qty 1

## 2012-09-01 MED ORDER — ACETAMINOPHEN 325 MG PO TABS
650.0000 mg | ORAL_TABLET | Freq: Four times a day (QID) | ORAL | Status: DC | PRN
  Administered 2012-09-01 – 2012-09-02 (×2): 650 mg via ORAL
  Filled 2012-09-01 (×2): qty 2

## 2012-09-01 MED ORDER — ONDANSETRON HCL 4 MG/2ML IJ SOLN: 4.0000 mg | Freq: Four times a day (QID) | INTRAMUSCULAR | Status: DC | PRN

## 2012-09-01 MED ORDER — MIRTAZAPINE 30 MG PO TABS
45.0000 mg | ORAL_TABLET | Freq: Every day | ORAL | Status: DC
  Administered 2012-09-01: 45 mg via ORAL
  Filled 2012-09-01: qty 2

## 2012-09-01 MED ORDER — HYDRALAZINE HCL 20 MG/ML IJ SOLN: 10.0000 mg | Freq: Four times a day (QID) | INTRAMUSCULAR | Status: DC | PRN

## 2012-09-01 MED ORDER — INSULIN REGULAR HUMAN 100 UNIT/ML IJ SOLN
INTRAMUSCULAR | Status: AC
  Filled 2012-09-01: qty 3

## 2012-09-01 MED ORDER — CARVEDILOL 12.5 MG PO TABS
25.0000 mg | ORAL_TABLET | Freq: Two times a day (BID) | ORAL | Status: DC
  Administered 2012-09-01 – 2012-09-02 (×3): 25 mg via ORAL
  Filled 2012-09-01 (×3): qty 2

## 2012-09-01 MED ORDER — SODIUM CHLORIDE 0.9 % IV SOLN
INTRAVENOUS | Status: DC
  Administered 2012-09-01: 5.4 [IU]/h via INTRAVENOUS
  Filled 2012-09-01: qty 1

## 2012-09-01 MED ORDER — SODIUM CHLORIDE 0.9 % IV BOLUS (SEPSIS)
1000.0000 mL | Freq: Once | INTRAVENOUS | Status: AC
  Administered 2012-09-01: 1000 mL via INTRAVENOUS

## 2012-09-01 MED ORDER — SODIUM CHLORIDE 0.9 % IV SOLN: INTRAVENOUS | Status: DC

## 2012-09-01 MED ORDER — ACETAMINOPHEN 650 MG RE SUPP: 650.0000 mg | Freq: Four times a day (QID) | RECTAL | Status: DC | PRN

## 2012-09-01 MED ORDER — METOCLOPRAMIDE HCL 10 MG PO TABS: 10.0000 mg | ORAL_TABLET | Freq: Four times a day (QID) | ORAL | Status: DC | PRN

## 2012-09-01 MED ORDER — SODIUM CHLORIDE 0.9 % IV SOLN
INTRAVENOUS | Status: DC
  Administered 2012-09-01 – 2012-09-02 (×4): via INTRAVENOUS

## 2012-09-01 MED ORDER — ASPIRIN EC 81 MG PO TBEC
81.0000 mg | DELAYED_RELEASE_TABLET | Freq: Every day | ORAL | Status: DC
  Administered 2012-09-01 – 2012-09-02 (×2): 81 mg via ORAL
  Filled 2012-09-01 (×2): qty 1

## 2012-09-01 MED ORDER — ALBUTEROL SULFATE (5 MG/ML) 0.5% IN NEBU: 2.5000 mg | INHALATION_SOLUTION | RESPIRATORY_TRACT | Status: DC | PRN

## 2012-09-01 MED ORDER — LAMOTRIGINE 150 MG PO TABS
300.0000 mg | ORAL_TABLET | Freq: Every day | ORAL | Status: DC
  Administered 2012-09-01: 300 mg via ORAL
  Filled 2012-09-01 (×3): qty 2

## 2012-09-01 MED ORDER — ONDANSETRON HCL 4 MG PO TABS: 4.0000 mg | ORAL_TABLET | Freq: Four times a day (QID) | ORAL | Status: DC | PRN

## 2012-09-01 MED ORDER — DEXTROSE-NACL 5-0.45 % IV SOLN: INTRAVENOUS | Status: DC

## 2012-09-01 MED ORDER — INSULIN ASPART 100 UNIT/ML ~~LOC~~ SOLN
0.0000 [IU] | Freq: Three times a day (TID) | SUBCUTANEOUS | Status: DC
  Administered 2012-09-01: 3 [IU] via SUBCUTANEOUS
  Administered 2012-09-02: 7 [IU] via SUBCUTANEOUS

## 2012-09-01 MED ORDER — MORPHINE SULFATE 2 MG/ML IJ SOLN: 1.0000 mg | INTRAMUSCULAR | Status: DC | PRN

## 2012-09-01 MED ORDER — CLOTRIMAZOLE 1 % VA CREA
1.0000 | TOPICAL_CREAM | Freq: Every day | VAGINAL | Status: DC
  Administered 2012-09-01: 1 via VAGINAL
  Filled 2012-09-01: qty 45

## 2012-09-01 MED ORDER — SIMVASTATIN 20 MG PO TABS
40.0000 mg | ORAL_TABLET | Freq: Every day | ORAL | Status: DC
  Administered 2012-09-01: 40 mg via ORAL
  Filled 2012-09-01: qty 2

## 2012-09-01 MED ORDER — ALBUTEROL SULFATE HFA 108 (90 BASE) MCG/ACT IN AERS: 2.0000 | INHALATION_SPRAY | Freq: Four times a day (QID) | RESPIRATORY_TRACT | Status: DC | PRN

## 2012-09-01 MED ORDER — INSULIN GLARGINE 100 UNIT/ML ~~LOC~~ SOLN
40.0000 [IU] | Freq: Every day | SUBCUTANEOUS | Status: DC
  Administered 2012-09-01 – 2012-09-02 (×2): 40 [IU] via SUBCUTANEOUS
  Filled 2012-09-01 (×4): qty 0.4

## 2012-09-01 MED ORDER — INSULIN ASPART 100 UNIT/ML ~~LOC~~ SOLN
0.0000 [IU] | Freq: Every day | SUBCUTANEOUS | Status: DC
  Administered 2012-09-01: 4 [IU] via SUBCUTANEOUS

## 2012-09-01 NOTE — ED Provider Notes (Addendum)
History     CSN: [id number]  Arrival date & time 09/01/12  0240   First MD Initiated Contact with Patient 09/01/12 [phone]      Chief Complaint  Patient presents with  . Hyperglycemia    (Consider location/radiation/quality/duration/timing/severity/associated sxs/prior treatment) HPI Jasmine [name] is a 42 y.o. female with a h/o HTN, bipolar disorder, GERD,  who presents to the Emergency Department complaining of glucose is out of control. Patient is diabetic I  on insulin pump x 2 years and managed by Dr. [name] of [facility]. Yesterday her sugar was low in the morning and began going up after lunch. She changed her insulin in the pump and the sugars kept getting higher. Denies fever, chills, nausea, vomiting, diarrhea, shortness of breath, cough.  PCP Dr. Deatra Ina Endocrinologist Dr. Greer Pickerel Past Medical History  Diagnosis Date  . Hypertension   . Diabetes mellitus   . Bipolar disorder   . CKD (chronic kidney disease), stage II   . Elevated cholesterol   . GERD (gastroesophageal reflux disease)   . Stress incontinence   . Hypothyroidism   . Sleep apnea     HAS C -PAP / DOES NOT USE  . Anxiety   . Depression   . UTI (lower urinary tract infection)   . Tobacco abuse   . Obesity   . DKA, type 1 11/04/2011    Past Surgical History  Procedure Laterality Date  . Ovary removed    . Nasal fracture surgery    . Uterine fibroid surgery  2001  . Pubovaginal sling  08/16/2011    Procedure: [name];  Surgeon: Bernestine Amass, MD;  Location: [facility];  Service: Urology;  Laterality: N/A;           Family History  Problem Relation Age of Onset  . Asthma Mother   . Heart disease Father   . Cancer Paternal Grandmother     lung and breast  . Bladder Cancer Paternal Grandfather     History  Substance Use Topics  . Smoking status: Current Every Day Smoker -- 0.50 packs/day for 20 years    Types: Cigarettes  . Smokeless tobacco: Not on file   . Alcohol Use: No    OB History   Grav Para Term Preterm Abortions TAB SAB Ect Mult Living                  Review of Systems  Constitutional: Negative for fever.       10 Systems reviewed and are negative for acute change except as noted in the HPI.  HENT: Negative for congestion.   Eyes: Negative for discharge and redness.  Respiratory: Negative for cough and shortness of breath.   Cardiovascular: Negative for chest pain.  Gastrointestinal: Negative for vomiting and abdominal pain.  Musculoskeletal: Negative for back pain.  Skin: Negative for rash.  Neurological: Negative for syncope, numbness and headaches.  Psychiatric/Behavioral:       No behavior change.    Allergies  Advair diskus and Biaxin  Home Medications   Current Outpatient Rx  Name  Route  Sig  Dispense  Refill  . acetaminophen (TYLENOL) 500 MG tablet   Oral   Take 1,000 mg by mouth every 6 (six) hours as needed. For pain         . aspirin EC 81 MG tablet   Oral   Take 81 mg by mouth daily.         . budesonide-formoterol (SYMBICORT)  160-4.5 MCG/ACT inhaler   Inhalation   Inhale 2 puffs into the lungs 2 (two) times daily.         . carvedilol (COREG) 25 MG tablet   Oral   Take 25 mg by mouth 2 (two) times daily with a meal.          . cholecalciferol (VITAMIN D) 1000 UNITS tablet   Oral   Take 1,000 Units by mouth daily.         . clonazePAM (KLONOPIN) 1 MG tablet   Oral   Take 1 mg by mouth at bedtime.         . Flaxseed, Linseed, (FLAX SEED OIL) 1000 MG CAPS   Oral   Take 1 capsule by mouth daily.         . folic acid (FOLVITE) Q000111Q MCG tablet   Oral   Take 800 mcg by mouth daily.         . furosemide (LASIX) 40 MG tablet   Oral   Take 40 mg by mouth daily as needed.          . hydrochlorothiazide (HYDRODIURIL) 25 MG tablet   Oral   Take 25 mg by mouth daily.         . Insulin Human (INSULIN PUMP) 100 unit/ml SOLN   Subcutaneous   Inject 2.5 each into the  skin every hour.         . levothyroxine (SYNTHROID, LEVOTHROID) 100 MCG tablet   Oral   Take 100 mcg by mouth daily.         Marland Kitchen lisinopril (PRINIVIL,ZESTRIL) 20 MG tablet   Oral   Take 20 mg by mouth 2 (two) times daily.          . mirtazapine (REMERON) 45 MG tablet   Oral   Take 45 mg by mouth at bedtime.         . pravastatin (PRAVACHOL) 80 MG tablet   Oral   Take 80 mg by mouth at bedtime.         Marland Kitchen albuterol (PROVENTIL HFA;VENTOLIN HFA) 108 (90 BASE) MCG/ACT inhaler   Inhalation   Inhale 2 puffs into the lungs every 6 (six) hours as needed for wheezing.         . lamoTRIgine (LAMICTAL) 200 MG tablet   Oral   Take 300 mg by mouth at bedtime.          . metoCLOPramide (REGLAN) 10 MG tablet   Oral   Take 1 tablet (10 mg total) by mouth every 6 (six) hours as needed (nausea).   30 tablet   0     BP 120/56  Pulse 88  Temp(Src) 98.8 F (37.1 C) (Oral)  Resp 16  Ht 5' 9.5" (1.765 m)  Wt 300 lb (136.079 kg)  BMI 43.68 kg/m2  SpO2 92%  Physical Exam  Nursing note and vitals reviewed. Constitutional: She appears well-developed and well-nourished.  Awake, alert, nontoxic appearance.  HENT:  Head: Normocephalic and atraumatic.  Right Ear: External ear normal.  Left Ear: External ear normal.  Mouth/Throat: Oropharynx is clear and moist.  Eyes: EOM are normal. Pupils are equal, round, and reactive to light. Right eye exhibits no discharge. Left eye exhibits no discharge.  Neck: Neck supple.  Cardiovascular: Normal rate and intact distal pulses.   Pulmonary/Chest: Effort normal and breath sounds normal. She exhibits no tenderness.  Abdominal: Soft. Bowel sounds are normal. There is no tenderness. There is no rebound.  Musculoskeletal:  She exhibits no tenderness.  Baseline ROM, no obvious new focal weakness.  Neurological:  Mental status and motor strength appears baseline for patient and situation.  Skin: No rash noted.  Psychiatric: She has a normal  mood and affect.    ED Course  Procedures (including critical care time)  Results for orders placed during the hospital encounter of 09/01/12  GLUCOSE, CAPILLARY      Result Value Range   Glucose-Capillary >600 (*) 70 - 99 mg/dL  CBC WITH DIFFERENTIAL      Result Value Range   WBC 10.0  4.0 - 10.5 K/uL   RBC 4.43  3.87 - 5.11 MIL/uL   Hemoglobin 13.6  12.0 - 15.0 g/dL   HCT 40.2  36.0 - 46.0 %   MCV 90.7  78.0 - 100.0 fL   MCH 30.7  26.0 - 34.0 pg   MCHC 33.8  30.0 - 36.0 g/dL   RDW 12.5  11.5 - 15.5 %   Platelets 252  150 - 400 K/uL   Neutrophils Relative 65  43 - 77 %   Neutro Abs 6.5  1.7 - 7.7 K/uL   Lymphocytes Relative 24  12 - 46 %   Lymphs Abs 2.4  0.7 - 4.0 K/uL   Monocytes Relative 7  3 - 12 %   Monocytes Absolute 0.7  0.1 - 1.0 K/uL   Eosinophils Relative 3  0 - 5 %   Eosinophils Absolute 0.3  0.0 - 0.7 K/uL   Basophils Relative 1  0 - 1 %   Basophils Absolute 0.1  0.0 - 0.1 K/uL  COMPREHENSIVE METABOLIC PANEL      Result Value Range   Sodium 123 (*) 135 - 145 mEq/L   Potassium 5.4 (*) 3.5 - 5.1 mEq/L   Chloride 89 (*) 96 - 112 mEq/L   CO2 19  19 - 32 mEq/L   Glucose, Bld 686 (*) 70 - 99 mg/dL   BUN 27 (*) 6 - 23 mg/dL   Creatinine, Ser 1.86 (*) 0.50 - 1.10 mg/dL   Calcium 9.1  8.4 - 10.5 mg/dL   Total Protein 6.6  6.0 - 8.3 g/dL   Albumin 3.4 (*) 3.5 - 5.2 g/dL   AST 8  0 - 37 U/L   ALT 7  0 - 35 U/L   Alkaline Phosphatase 102  39 - 117 U/L   Total Bilirubin 0.5  0.3 - 1.2 mg/dL   GFR calc non Af Amer 33 (*) >90 mL/min   GFR calc Af Amer 38 (*) >90 mL/min  URINALYSIS, ROUTINE W REFLEX MICROSCOPIC      Result Value Range   Color, Urine STRAW (*) YELLOW   APPearance CLEAR  CLEAR   Specific Gravity, Urine <1.005 (*) 1.005 - 1.030   pH 5.5  5.0 - 8.0   Glucose, UA >1000 (*) NEGATIVE mg/dL   Hgb urine dipstick LARGE (*) NEGATIVE   Bilirubin Urine NEGATIVE  NEGATIVE   Ketones, ur 15 (*) NEGATIVE mg/dL   Protein, ur NEGATIVE  NEGATIVE mg/dL    Urobilinogen, UA 0.2  0.0 - 1.0 mg/dL   Nitrite NEGATIVE  NEGATIVE   Leukocytes, UA NEGATIVE  NEGATIVE  URINE MICROSCOPIC-ADD ON      Result Value Range   Squamous Epithelial / LPF MANY (*) RARE   WBC, UA 0-2  <3 WBC/hpf   RBC / HPF 0-2  <3 RBC/hpf   Bacteria, UA FEW (*) RARE   Urine-Other FEW YEAST  BLOOD GAS, ARTERIAL      Result Value Range   FIO2 21.00     Delivery systems ROOM AIR     pH, Arterial 7.299 (*) 7.350 - 7.450   pCO2 arterial 35.4  35.0 - 45.0 mmHg   pO2, Arterial 67.9 (*) 80.0 - 100.0 mmHg   Bicarbonate 16.8 (*) 20.0 - 24.0 mEq/L   TCO2 15.4  0 - 100 mmol/L   Acid-Base Excess 8.4 (*) 0.0 - 2.0 mmol/L   O2 Saturation 92.7     Collection site RIGHT RADIAL     Drawn by [id number]     Sample type ARTERIAL     Allens test (pass/fail) PASS  PASS     4:11 AM:  T/C to Dr. Maryland Pink, hospitalist, case discussed, including:  HPI, pertinent PM/SHx, VS/PE, dx testing, ED course and treatment.  Agreeable to admission.   MDM  Diabetic patient with insulin pump and high glucose. Will arrange admission for patient. Spoke with Dr. Fredrich Birks, hospitalist, who will admit. Patient was placed on glucomander. Pt stable in ED with no significant deterioration in condition.The patient appears reasonably stabilized for admission considering the current resources, flow, and capabilities available in the ED at this time, and I doubt any other [facility] requiring further screening and/or treatment in the ED prior to admission.  MDM Reviewed: nursing note and vitals Interpretation: labs           Gypsy Balsam. Olin Hauser, MD 09/01/12 [place]. Olin Hauser, MD 09/01/12 ED:[id number]

## 2012-09-01 NOTE — ED Notes (Signed)
Current insulin pump setting is 2.5units novalog insulin/hr.  Last bolus @ 0157 was 3.95 units

## 2012-09-01 NOTE — Progress Notes (Signed)
     Subjective: This lady was admitted with mild DKA this morning. She feels better in a few hours she has been here in terms of her overall condition and her abdominal cramps also. There is no nausea or vomiting.           Physical Exam: Blood pressure 90/50, pulse 83, temperature 98.7 F (37.1 C), temperature source Oral, resp. rate 16, height 5\' 9"  (1.753 m), weight 132.2 kg (291 lb 7.2 oz), SpO2 100.00%. She looks systemically well and is not clinically shocked despite the soft blood pressure. Heart sounds are present and normal. Lung fields are clear. She is alert and orientated.   Investigations:     Basic Metabolic Panel:  Recent Labs  09/01/12 0255 09/01/12 0556  NA 123* 126*  K 5.4* 5.5*  CL 89* 93*  CO2 19 15*  GLUCOSE 686* 592*  BUN 27* 29*  CREATININE 1.86* 1.81*  CALCIUM 9.1 8.7   Liver Function Tests:  Recent Labs  09/01/12 0255  AST 8  ALT 7  ALKPHOS 102  BILITOT 0.5  PROT 6.6  ALBUMIN 3.4*     CBC:  Recent Labs  09/01/12 0255  WBC 10.0  NEUTROABS 6.5  HGB 13.6  HCT 40.2  MCV 90.7  PLT 252        Medications: I have reviewed the patient's current medications.  Impression: 1. Mild DKA in a patient with type 1 diabetes mellitus. Insulin pump dependent. Pump was not working and this is why her diabetes has deteriorated. 2. Hyponatremia secondary to #1. 3. Chronic kidney disease stage II. 4. Hypothyroidism. 5. Ongoing tobacco abuse.     Plan: 1. Continue with IV fluids at an increased rate. Continue with IV insulin. 2. She will need to be on subcutaneous insulin when she goes home.     LOS: 0 days   Doree Albee Pager [phone]  09/01/2012, 8:00 AM

## 2012-09-01 NOTE — Progress Notes (Signed)
Patient refused Symbicort inhaler this am stated " she no longer uses it and that it was discontinued by her doctor approximately two weeks ago". Not given by RT

## 2012-09-01 NOTE — Progress Notes (Addendum)
Inpatient Diabetes Program Recommendations  AACE/ADA: New Consensus Statement on Inpatient Glycemic Control (2013)  Target Ranges:  Prepandial:   less than 140 mg/dL      Peak postprandial:   less than 180 mg/dL (1-2 hours)      Critically ill patients:  140 - 180 mg/dL      Note: Talked with patient about diabetes and her insulin pump regimen.  According to the patient her blood sugar was high at dinner time and she gave herself her insulin via pump.  When blood glucose was rechecked it was still high so she changed out her infusion set and also changed the cartilage of insulin in the pump.  Advised patient to call [phone] number on the back of her pump to trouble shoot the pump.  Recommend that patient be placed on insulin injections at this time until she can trouble shoot insulin pump and follow up with Dr. [name] at [facility].  In reviewing the patient's insulin pump, her settings are as follows:  Basal dose is 59 units/day, Correction sensitivity is 1 unit brings blood glucose down 30 points, Carb coverage ratio is 1 units for every 4 grams of carbohydrates.    In talking with the patient she reports that her blood sugar has been low first thing in the mornings over the past 2 weeks.  Therefore, recommend using a lower dose of basal insulin than the basal dose received through the insulin pump.  Patient reports that she has Lantus and Novolog at home and would appreciate if she could be transitioned to these two insulins.  Current anion gap is 11 based on BMET from 11:39 am today.  Plan is to transition patient to SQ insulin.  Will continue to follow.  Thanks, Barnie Alderman, RN, BSN, [place] [facility] Coordinator Inpatient Diabetes Program [phone]

## 2012-09-01 NOTE — Progress Notes (Signed)
Notified Dr. Anastasio Champion about pts. Blood sugar being below 250 and to let him know Anion Gap had closed. He increased her NS to 200 ml per hour and is putting in additional orders.

## 2012-09-01 NOTE — ED Notes (Signed)
CRITICAL VALUE ALERT  Critical value received: glucose 686  Date of notification: 09/01/12  Time of notification:  0338  Critical value read back:yes  Nurse who received alert:  smoore rn  MD notified (1st page): dr strand  Time of first page: [phone]  MD notified (2nd page):  Time of second page:  Responding MD: dr strand Time MD [id number]

## 2012-09-01 NOTE — Progress Notes (Signed)
UR Chart Review Completed  

## 2012-09-01 NOTE — ED Notes (Signed)
On insulin pump. Having a hard time keeping her blood sugar down. Last blood sugar at home 1/2 hour ago was 477

## 2012-09-01 NOTE — H&P (Signed)
[facility] History and Physical  Jasmine [name] [id number] DOB: Dec 13, 1970 DOA: 09/01/2012   PCP: Tula Nakayama  Specialists: Her endocrinologist is a Dr. [name] with [facility]. She is followed by [facility] for stage II, chronic kidney disease. She's also followed by Dr. Chase Caller with pulmonology for pulmonary nodules  Chief Complaint: High blood sugars and abdominal cramps  HPI: Jasmine [name] is a 42 y.o. female with a past medical history of, diabetes, on insulin pump, obesity, hypertension, chronic kidney disease, hypothyroidism, obstructive sleep apnea, not compliant with CPAP, who was in her usual state of health till yesterday when she started noticing that her blood sugars was running high. She changed the site of her insulin pump and used a different vial of insulin. However, her blood sugars continue to remain greater than 400. She started having some nausea, felt a little weak, and, so, she decided to come in to the hospital. She also has been having lower abdominal cramping for the last 4 days. Denies any vaginal bleeding, but has noticed minimal discharge at times. Denies any and vomiting. No history of diarrhea. No fever. No chills. Has had decreased appetite in the last few days. Denies any other changes to her medication regimen recently.  Home Medications: Prior to Admission medications   Medication Sig Start Date End Date Taking? Authorizing Provider  acetaminophen (TYLENOL) 500 MG tablet Take 1,000 mg by mouth every 6 (six) hours as needed. For pain   Yes Historical Provider, MD  aspirin EC 81 MG tablet Take 81 mg by mouth daily.   Yes Historical Provider, MD  budesonide-formoterol (SYMBICORT) 160-4.5 MCG/ACT inhaler Inhale 2 puffs into the lungs 2 (two) times daily.   Yes Historical Provider, MD  carvedilol (COREG) 25 MG tablet Take 25 mg by mouth 2 (two) times daily with a meal.    Yes Historical Provider, MD  cholecalciferol  (VITAMIN D) 1000 UNITS tablet Take 1,000 Units by mouth daily.   Yes Historical Provider, MD  clonazePAM (KLONOPIN) 1 MG tablet Take 1 mg by mouth at bedtime.   Yes Historical Provider, MD  Flaxseed, Linseed, (FLAX SEED OIL) 1000 MG CAPS Take 1 capsule by mouth daily.   Yes Historical Provider, MD  folic acid (FOLVITE) [id number] MCG tablet Take 800 mcg by mouth daily.   Yes Historical Provider, MD  furosemide (LASIX) 40 MG tablet Take 40 mg by mouth daily as needed.    Yes Historical Provider, MD  hydrochlorothiazide (HYDRODIURIL) 25 MG tablet Take 25 mg by mouth daily.   Yes Historical Provider, MD  Insulin Human (INSULIN PUMP) 100 unit/ml SOLN Inject 2.5 each into the skin every hour.   Yes Historical Provider, MD  levothyroxine (SYNTHROID, LEVOTHROID) 100 MCG tablet Take 100 mcg by mouth daily.   Yes Historical Provider, MD  lisinopril (PRINIVIL,ZESTRIL) 20 MG tablet Take 20 mg by mouth 2 (two) times daily.    Yes Historical Provider, MD  mirtazapine (REMERON) 45 MG tablet Take 45 mg by mouth at bedtime.   Yes Historical Provider, MD  pravastatin (PRAVACHOL) 80 MG tablet Take 80 mg by mouth at bedtime.   Yes Historical Provider, MD  albuterol (PROVENTIL HFA;VENTOLIN HFA) 108 (90 BASE) MCG/ACT inhaler Inhale 2 puffs into the lungs every 6 (six) hours as needed for wheezing.    Historical Provider, MD  lamoTRIgine (LAMICTAL) 200 MG tablet Take 300 mg by mouth at bedtime.     Historical Provider, MD  metoCLOPramide (REGLAN) 10 MG tablet Take 1  tablet (10 mg total) by mouth every 6 (six) hours as needed (nausea). 11/07/11 08/14/13  Delfina Redwood, MD    Allergies:  Allergies  Allergen Reactions  . Advair Diskus (Fluticasone-Salmeterol)     thrush  . Biaxin (Clarithromycin)     Past Medical History: Past Medical History  Diagnosis Date  . Hypertension   . Diabetes mellitus   . Bipolar disorder   . CKD (chronic kidney disease), stage II   . Elevated cholesterol   . GERD (gastroesophageal  reflux disease)   . Stress incontinence   . Hypothyroidism   . Sleep apnea     HAS C -PAP / DOES NOT USE  . Anxiety   . Depression   . UTI (lower urinary tract infection)   . Tobacco abuse   . Obesity   . DKA, type 1 11/04/2011    Past Surgical History  Procedure Laterality Date  . Ovary removed    . Nasal fracture surgery    . Uterine fibroid surgery  2001  . Pubovaginal sling  08/16/2011    Procedure: [name];  Surgeon: Bernestine Amass, MD;  Location: [facility];  Service: Urology;  Laterality: N/A;           Social History:  reports that she has been smoking Cigarettes.  She has a 10 pack-year smoking history. She does not have any smokeless tobacco history on file. She reports that she does not drink alcohol or use illicit drugs.  Living Situation: Lives in [place] Activity Level: Usually independent with daily activities   Family History:  Family History  Problem Relation Age of Onset  . Asthma Mother   . Heart disease Father   . Cancer Paternal Grandmother     lung and breast  . Bladder Cancer Paternal Grandfather      Review of Systems - History obtained from the patient General ROS: positive for  - fatigue Psychological ROS: negative Ophthalmic ROS: negative ENT ROS: negative Allergy and Immunology ROS: negative Hematological and Lymphatic ROS: negative Endocrine ROS: negative Respiratory ROS: no cough, shortness of breath, or wheezing Cardiovascular ROS: no chest pain or dyspnea on exertion Gastrointestinal ROS: as in hpi Genito-Urinary ROS: as in hpi Musculoskeletal ROS: negative Neurological ROS: no TIA or stroke symptoms Dermatological ROS: negative  Physical Examination  Filed Vitals:   09/01/12 0253 09/01/12 0300 09/01/12 0400  BP: 140/78 118/71 120/56  Pulse: 81 81 88  Temp: 98.8 F (37.1 C)    TempSrc: Oral    Resp: 16    Height: 5' 9.5" (1.765 m)    Weight: 136.079 kg (300 lb)    SpO2: 96% 94% 92%    General appearance:  alert, cooperative, appears stated age, no distress and morbidly obese Head: Normocephalic, without obvious abnormality, atraumatic Throat: lips, mucosa, and tongue normal; teeth and gums normal Neck: no adenopathy, no carotid bruit, no JVD, supple, symmetrical, trachea midline and thyroid not enlarged, symmetric, no tenderness/mass/nodules Resp: clear to auscultation bilaterally Cardio: regular rate and rhythm, S1, S2 normal, no murmur, click, rub or gallop GI: Abdomen is soft. There is tenderness in the lower abdominal quadrants bilaterally. Including the suprapubic area. No masses, or organomegaly. No rebound, rigidity, or guarding. Bowel sounds are present Extremities: Minimal edema is noted Pulses: 2+ and symmetric Skin: Skin color, texture, turgor normal. No rashes or lesions Lymph nodes: Cervical, supraclavicular, and axillary nodes normal. Neurologic: She is alert and oriented x3. No focal neurological deficits present.  Laboratory Data: Results  for orders placed during the hospital encounter of 09/01/12 (from the past 48 hour(s))  CBC WITH DIFFERENTIAL     Status: None   Collection Time    09/01/12  2:55 AM      Result Value Range   WBC 10.0  4.0 - 10.5 K/uL   RBC 4.43  3.87 - 5.11 MIL/uL   Hemoglobin 13.6  12.0 - 15.0 g/dL   HCT 40.2  36.0 - 46.0 %   MCV 90.7  78.0 - 100.0 fL   MCH 30.7  26.0 - 34.0 pg   MCHC 33.8  30.0 - 36.0 g/dL   RDW 12.5  11.5 - 15.5 %   Platelets 252  150 - 400 K/uL   Neutrophils Relative 65  43 - 77 %   Neutro Abs 6.5  1.7 - 7.7 K/uL   Lymphocytes Relative 24  12 - 46 %   Lymphs Abs 2.4  0.7 - 4.0 K/uL   Monocytes Relative 7  3 - 12 %   Monocytes Absolute 0.7  0.1 - 1.0 K/uL   Eosinophils Relative 3  0 - 5 %   Eosinophils Absolute 0.3  0.0 - 0.7 K/uL   Basophils Relative 1  0 - 1 %   Basophils Absolute 0.1  0.0 - 0.1 K/uL  COMPREHENSIVE METABOLIC PANEL     Status: Abnormal   Collection Time    09/01/12  2:55 AM      Result Value Range    Sodium 123 (*) 135 - 145 mEq/L   Potassium 5.4 (*) 3.5 - 5.1 mEq/L   Chloride 89 (*) 96 - 112 mEq/L   CO2 19  19 - 32 mEq/L   Glucose, Bld 686 (*) 70 - 99 mg/dL   Comment: CRITICAL RESULT CALLED TO, READ BACK BY AND VERIFIED WITH:     MOORE S AT 0336 ON [id number] BY FORSYTH K   BUN 27 (*) 6 - 23 mg/dL   Creatinine, Ser 1.86 (*) 0.50 - 1.10 mg/dL   Calcium 9.1  8.4 - 10.5 mg/dL   Total Protein 6.6  6.0 - 8.3 g/dL   Albumin 3.4 (*) 3.5 - 5.2 g/dL   AST 8  0 - 37 U/L   ALT 7  0 - 35 U/L   Alkaline Phosphatase 102  39 - 117 U/L   Total Bilirubin 0.5  0.3 - 1.2 mg/dL   GFR calc non Af Amer 33 (*) >90 mL/min   GFR calc Af Amer 38 (*) >90 mL/min   Comment:            The eGFR has been calculated     using the CKD EPI equation.     This calculation has not been     validated in all clinical     situations.     eGFR's persistently     <90 mL/min signify     possible Chronic Kidney Disease.  GLUCOSE, CAPILLARY     Status: Abnormal   Collection Time    09/01/12  3:00 AM      Result Value Range   Glucose-Capillary >600 (*) 70 - 99 mg/dL  URINALYSIS, ROUTINE W REFLEX MICROSCOPIC     Status: Abnormal   Collection Time    09/01/12  3:06 AM      Result Value Range   Color, Urine STRAW (*) YELLOW   APPearance CLEAR  CLEAR   Specific Gravity, Urine <1.005 (*) 1.005 - 1.030   pH 5.5  5.0 -  8.0   Glucose, UA >1000 (*) NEGATIVE mg/dL   Hgb urine dipstick LARGE (*) NEGATIVE   Bilirubin Urine NEGATIVE  NEGATIVE   Ketones, ur 15 (*) NEGATIVE mg/dL   Protein, ur NEGATIVE  NEGATIVE mg/dL   Urobilinogen, UA 0.2  0.0 - 1.0 mg/dL   Nitrite NEGATIVE  NEGATIVE   Leukocytes, UA NEGATIVE  NEGATIVE  URINE MICROSCOPIC-ADD ON     Status: Abnormal   Collection Time    09/01/12  3:06 AM      Result Value Range   Squamous Epithelial / LPF MANY (*) RARE   WBC, UA 0-2  <3 WBC/hpf   RBC / HPF 0-2  <3 RBC/hpf   Bacteria, UA FEW (*) RARE   Urine-Other FEW YEAST    BLOOD GAS, ARTERIAL     Status: Abnormal    Collection Time    09/01/12  4:12 AM      Result Value Range   FIO2 21.00     Delivery systems ROOM AIR     pH, Arterial 7.299 (*) 7.350 - 7.450   pCO2 arterial 35.4  35.0 - 45.0 mmHg   pO2, Arterial 67.9 (*) 80.0 - 100.0 mmHg   Bicarbonate 16.8 (*) 20.0 - 24.0 mEq/L   TCO2 15.4  0 - 100 mmol/L   Acid-Base Excess 8.4 (*) 0.0 - 2.0 mmol/L   O2 Saturation 92.7     Collection site RIGHT RADIAL     Drawn by [id number]     Sample type ARTERIAL     Allens test (pass/fail) PASS  PASS  GLUCOSE, CAPILLARY     Status: Abnormal   Collection Time    09/01/12  4:23 AM      Result Value Range   Glucose-Capillary >600 (*) 70 - 99 mg/dL    Radiology Reports: No results found.   Problem List  Principal Problem:   DKA, type 1 Active Problems:   DM (diabetes mellitus), type 1, uncontrolled   Hyponatremia   CKD (chronic kidney disease), stage II   Hypothyroidism   Tobacco abuse   Assessment: This is a 42 year old, Caucasian female, who presents with high blood sugars at home, not controlled with her pump and has a lower abdominal cramp. Unclear why her blood sugars are not controlled with her insulin pump. She appears to have mild DKA at this time. She has no abdominal cramping, etiology of which is unclear, but may require further evaluation.  Plan: #1 poorly controlled diabetes with mild DKA: Anion gap is 15. We will place her on intravenous insulin. Will remove the insulin pump. Get her blood sugars in the control. Monitor labs closely. She will need to be transitioned over to some form of subcutaneous insulin so, that she can be discharged and then follow up with her endocrinologist, who will need to review her insulin settings. She'll be given IV fluids as well.  #2 abdominal cramps: Proceed with a pelvic ultrasound. Urine pregnancy test, will be checked. UA does not suggest infection. Since her urine did show some yeast, and she's been having some discharge we can give her a trial of  Monistat.  #3 history of chronic kidney disease, stage II: Monitor creatinine closely.  #4 electrolyte abnormalities including hyponatremia and hyperkalemia: Hyponatremia is pseudohyponatremia due to hyperglycemia. Monitor potassium levels closely. Potassium level should come down with the insulin.  #5 history of tobacco abuse: Nicotine patch will be prescribed.  #6 history of hypothyroidism: Continue with her levothyroxin.  #7 history of bipolar  disorder: Continue with the psychotropic agents.  #8 history of pulmonary nodules and lymphadenopathy: Follow up with a pulmonologist. No active evaluation. During this hospitalization. She does have a history of sleep apnea, however, is not compliant with her CPAP.  DVT Prophylaxis: Heparin Code Status: Full code Family Communication: Discussed with the patient and her father, who is at the, bedside  Disposition Plan: Unclear for now   Further management decisions will depend on results of further testing and patient's response to treatment.  [facility]  [facility] Pager [phone]  If 7PM-7AM, please contact night-coverage [email or web address] Password [facility]  09/01/2012, 4:54 AM

## 2012-09-02 DIAGNOSIS — N182 Chronic kidney disease, stage 2 (mild): Secondary | ICD-10-CM

## 2012-09-02 LAB — CBC
Hemoglobin: 12.1 g/dL (ref 12.0–15.0)
Platelets: 218 10*3/uL (ref 150–400)
RBC: 3.95 MIL/uL (ref 3.87–5.11)
WBC: 6.6 10*3/uL (ref 4.0–10.5)

## 2012-09-02 LAB — GLUCOSE, CAPILLARY: Glucose-Capillary: 222 mg/dL — ABNORMAL HIGH (ref 70–99)

## 2012-09-02 LAB — BASIC METABOLIC PANEL
CO2: 20 mEq/L (ref 19–32)
Calcium: 8 mg/dL — ABNORMAL LOW (ref 8.4–10.5)
Chloride: 105 mEq/L (ref 96–112)
Potassium: 4.4 mEq/L (ref 3.5–5.1)
Sodium: 135 mEq/L (ref 135–145)

## 2012-09-02 MED ORDER — INSULIN GLARGINE 100 UNIT/ML ~~LOC~~ SOLN: 60.0000 [IU] | Freq: Every day | SUBCUTANEOUS | Status: DC

## 2012-09-02 MED ORDER — INSULIN ASPART 100 UNIT/ML ~~LOC~~ SOLN: 0.0000 [IU] | Freq: Every day | SUBCUTANEOUS | Status: DC

## 2012-09-02 MED ORDER — INSULIN ASPART 100 UNIT/ML ~~LOC~~ SOLN: 0.0000 [IU] | Freq: Three times a day (TID) | SUBCUTANEOUS | Status: DC

## 2012-09-02 NOTE — Discharge Summary (Signed)
Physician Discharge Summary  Jasmine [name] [id number] DOB: 11-27-1970 DOA: 09/01/2012  PCP: Tula Nakayama  Admit date: 09/01/2012 Discharge date: 09/02/2012  Time spent: Greater than 30 minutes  Recommendations for Outpatient Follow-up:  1. Follow with endocrinologist within a week.  Discharge Diagnoses:  1. Mild DKA, resolved. 2. Type 1 diabetes mellitus. 3. Chronic kidney disease. 4. Hypothyroidism. 5. Obesity. 6. Tobacco abuse.   Discharge Condition: Stable and improved.  Diet recommendation: Carbohydrate modified diet.  Filed Weights   09/01/12 0506 09/01/12 0527 09/02/12 0500  Weight: 132.6 kg (292 lb 5.3 oz) 132.2 kg (291 lb 7.2 oz) 137.4 kg (302 lb 14.6 oz)    History of present illness:  This 42 year old lady presented to the hospital with symptoms of elevated blood sugars and abdominal cramps. Please see initial history as outlined below: HPI: Jasmine [name] is a 42 y.o. female with a past medical history of, diabetes, on insulin pump, obesity, hypertension, chronic kidney disease, hypothyroidism, obstructive sleep apnea, not compliant with CPAP, who was in her usual state of health till yesterday when she started noticing that her blood sugars was running high. She changed the site of her insulin pump and used a different vial of insulin. However, her blood sugars continue to remain greater than 400. She started having some nausea, felt a little weak, and, so, she decided to come in to the hospital. She also has been having lower abdominal cramping for the last 4 days. Denies any vaginal bleeding, but has noticed minimal discharge at times. Denies any and vomiting. No history of diarrhea. No fever. No chills. Has had decreased appetite in the last few days. Denies any other changes to her medication regimen recently.  Hospital Course:  The patient was treated for DKA in the usual fashion with IV fluids and IV insulin. She was transitioned into subcutaneous  insulin and her anion gap closed. She felt much better. The abdominal cramps resolved as her diabetes improved. Ultrasound of the pelvis was done, this was unremarkable except for a slightly complex right ovarian cyst which will need followup. She was able to tolerate a diabetic diet and her blood glucoses on subcutaneous insulin has improved. She will continue as an outpatient on Lantus insulin subcutaneously for the time being until she sees her endocrinologist soon.  Procedures:  None.   Consultations:  None.  Discharge Exam: Filed Vitals:   09/02/12 0000 09/02/12 0010 09/02/12 0400 09/02/12 0500  BP:  118/64 112/65   Pulse:      Temp: 98.1 F (36.7 C)  97.7 F (36.5 C)   TempSrc: Oral  Oral   Resp:  16 17   Height:      Weight:    137.4 kg (302 lb 14.6 oz)  SpO2:        General: She looks systemically well. She is not toxic or septic. Cardiovascular: Heart sounds are present and normal in sinus rhythm. There is no gallop rhythm or murmurs. Respiratory: Lung fields are clear. She is alert and orientated.  Discharge Instructions  Discharge Orders   Future Appointments Provider Department Dept Phone   09/07/2012 10:00 AM [facility] [phone]   Pt should arrive15 minutes prior to scheduled appt time. Please inform patient that exam will take a minimum of 1 1/2 hours. Patient to be NPO 6 hours prior to exam  and should not take any insulin the day of exam.   Future Orders Complete By Expires  Diet - low sodium heart healthy  As directed     Increase activity slowly  As directed         Medication List    STOP taking these medications       furosemide 40 MG tablet  Commonly known as:  LASIX     insulin pump 100 unit/ml Soln      TAKE these medications       acetaminophen 500 MG tablet  Commonly known as:  TYLENOL  Take 1,000 mg by mouth every 6 (six) hours as needed. For pain     albuterol 108 (90 BASE)  MCG/ACT inhaler  Commonly known as:  PROVENTIL HFA;VENTOLIN HFA  Inhale 2 puffs into the lungs every 6 (six) hours as needed for wheezing.     aspirin EC 81 MG tablet  Take 81 mg by mouth daily.     budesonide-formoterol 160-4.5 MCG/ACT inhaler  Commonly known as:  SYMBICORT  Inhale 2 puffs into the lungs 2 (two) times daily.     carvedilol 25 MG tablet  Commonly known as:  COREG  Take 25 mg by mouth 2 (two) times daily with a meal.     cholecalciferol 1000 UNITS tablet  Commonly known as:  VITAMIN D  Take 1,000 Units by mouth daily.     clonazePAM 1 MG tablet  Commonly known as:  KLONOPIN  Take 1 mg by mouth at bedtime.     Flax Seed Oil 1000 MG Caps  Take 1 capsule by mouth daily.     folic acid Q000111Q MCG tablet  Commonly known as:  FOLVITE  Take 800 mcg by mouth daily.     hydrochlorothiazide 25 MG tablet  Commonly known as:  HYDRODIURIL  Take 25 mg by mouth daily.     insulin aspart 100 UNIT/ML injection  Commonly known as:  novoLOG  Inject 0-20 Units into the skin 3 (three) times daily with meals.     insulin aspart 100 UNIT/ML injection  Commonly known as:  novoLOG  Inject 0-5 Units into the skin at bedtime.     insulin glargine 100 UNIT/ML injection  Commonly known as:  LANTUS  Inject 0.6 mLs (60 Units total) into the skin daily.     lamoTRIgine 100 MG tablet  Commonly known as:  LAMICTAL  Take 100 mg by mouth daily.     levothyroxine 100 MCG tablet  Commonly known as:  SYNTHROID, LEVOTHROID  Take 100 mcg by mouth daily.     lisinopril 20 MG tablet  Commonly known as:  PRINIVIL,ZESTRIL  Take 20 mg by mouth 2 (two) times daily.     mirtazapine 45 MG tablet  Commonly known as:  REMERON  Take 45 mg by mouth at bedtime.     pravastatin 80 MG tablet  Commonly known as:  PRAVACHOL  Take 80 mg by mouth at bedtime.          The results of significant diagnostics from this hospitalization (including imaging, microbiology, ancillary and laboratory)  are listed below for reference.    Significant Diagnostic Studies: US Pelvis Complete  09/01/2012  *RADIOLOGY REPORT*  Clinical Data: Vaginal discharge and lower abdominal cramps. Status post oophorectomy for endometriosis. Mirena IUD in place  TRANSABDOMINAL AND TRANSVAGINAL ULTRASOUND OF PELVIS Technique:  Both transabdominal and transvaginal ultrasound examinations of the pelvis were performed. Transabdominal technique was performed for global imaging of the pelvis including uterus, ovaries, adnexal regions, and pelvic cul-de-sac.  It was necessary to proceed with  endovaginal exam following the transabdominal exam to visualize the myometrium, endometrium and adnexa.  Comparison:  09/23/2008  Findings:  Uterus: Is anteverted and anteflexed and demonstrates a sagittal length is 7.3 cm, depth of 4.0 cm and width of 5.2 cm.  A mildly heterogeneous myometrium is seen with no focal abnormality identified  Endometrium: The patient's indwelling IUD is in place and appears appropriately positioned with 2-D imaging.  The endometrial lining is obscured by the presence of the IUD.  Right ovary:  The right ovary measures 4.4 x 2.9 x 3.8 cm and contains a mildly complex cystic lesion measuring 3.6 x 2.3 x 2.4 cm and containing at least one possibly two thin septations.  No flow is identified associated with this cyst with color Doppler exam and it appears to be partially collapsing suggesting it may be functional in nature.  Left ovary: Is not seen with confidence either transabdominally or endovaginally  Other findings: No pelvic fluid is seen.  IMPRESSION: The patient reported that the right ovary had been surgically removed but based on prior ultrasound which demonstrated a complex left ovarian mass and lack of visualization of the left ovary today, prior left oophorectomy is suspected  Slightly complex right ovarian cyst.  Given the imaging features, this is likely benign and functional in nature but short-term  reevaluation is recommended in 6-8 weeks for reassessment. If functional, this would likely show resolution over that period.  Appropriate IUD positioning suggested with 2-D imaging.   Original Report Authenticated By: Ponciano Ort, M.D.     Microbiology: Recent Results (from the past 240 hour(s))  MRSA PCR SCREENING     Status: None   Collection Time    09/01/12  5:08 AM      Result Value Range Status   MRSA by PCR NEGATIVE  NEGATIVE Final   Comment:            The GeneXpert MRSA Assay (FDA     approved for NASAL specimens     only), is one component of a     comprehensive MRSA colonization     surveillance program. It is not     intended to diagnose MRSA     infection nor to guide or     monitor treatment for     MRSA infections.     Labs: Basic Metabolic Panel:  Recent Labs Lab 09/01/12 0740 09/01/12 0948 09/01/12 1139 09/01/12 1531 09/02/12 0531  NA 129* 130* 131* 134* 135  K 4.6 4.7 4.4 4.5 4.4  CL 95* 99 100 101 105  CO2 20 20 20 22 20   GLUCOSE 466* 338* 230* 83 259*  BUN 31* 31* 31* 30* 25*  CREATININE 2.03* 2.01* 2.06* 1.94* 1.71*  CALCIUM 8.9 8.7 8.7 8.8 8.0*   Liver Function Tests:  Recent Labs Lab 09/01/12 0255  AST 8  ALT 7  ALKPHOS 102  BILITOT 0.5  PROT 6.6  ALBUMIN 3.4*     CBC:  Recent Labs Lab 09/01/12 0255 09/02/12 0531  WBC 10.0 6.6  NEUTROABS 6.5  --   HGB 13.6 12.1  HCT 40.2 35.5*  MCV 90.7 89.9  PLT 252 218      CBG:  Recent Labs Lab 09/01/12 1307 09/01/12 1450 09/01/12 1633 09/01/12 2129 09/02/12 0740  GLUCAP 120* 84 125* 327* 222*       Signed:  Cascade C  [facility] 09/02/2012, 8:04 AM

## 2012-09-02 NOTE — Progress Notes (Addendum)
Pt educated regarding diet, water consumption and the importance of checking her blood sugar at least 4 times daily (ACHS). Pt acknowledges the importance of diet, exercise, CBG monitoring. Discharge information given and pt understands and signs acknowledgment. IV is removed. Pt is discharged via wheelchair to personal vehicle with father and has all personal belongings at discharge. Pt also advised to follow up with Endocrinologist and she stated she had a scheduled appointment.

## 2012-09-07 ENCOUNTER — Telehealth: Payer: Self-pay | Admitting: Internal Medicine

## 2012-09-07 ENCOUNTER — Ambulatory Visit (HOSPITAL_COMMUNITY)
Admission: RE | Admit: 2012-09-07 | Discharge: 2012-09-07 | Disposition: A | Discharge status: Home / Self Care | Payer: Medicare Other | Source: Ambulatory Visit | Attending: Internal Medicine | Admitting: Internal Medicine

## 2012-09-07 DIAGNOSIS — R7309 Other abnormal glucose: Secondary | ICD-10-CM | POA: Insufficient documentation

## 2012-09-07 DIAGNOSIS — R911 Solitary pulmonary nodule: Secondary | ICD-10-CM | POA: Insufficient documentation

## 2012-09-07 DIAGNOSIS — Z5309 Procedure and treatment not carried out because of other contraindication: Secondary | ICD-10-CM | POA: Insufficient documentation

## 2012-09-07 NOTE — Telephone Encounter (Signed)
She is having PET scan 09/07/2012 at 10am. I had told Jasmine [name] to ensure insurance was going to pay for this and only then schedule but I never heard back. I do not want to assume but [facility] probably ensured insurance was paying. So, please double check either with [facility] or [facility].  Thanks  MR

## 2012-09-07 NOTE — Telephone Encounter (Signed)
I thought it was approved before patient doing it. Anyways, thanks for info  Dr. Brand Males, M.D., [facility].C.P Pulmonary and Critical Care Medicine Staff Physician [place] [facility] Pager: [phone], If no answer or between  15:00h - 7:00h: call [phone]  09/07/2012 3:20 PM

## 2012-09-07 NOTE — Telephone Encounter (Signed)
MR there is no way of knowing if ins will pay for this un til it is billed they do based on your diagnosis this pt has medicare and medicaid and medicare should cover 80% and medicaid pick up the 20%

## 2012-09-08 NOTE — Telephone Encounter (Signed)
Well her regular medicare does not require precert  So ie should cover at 80% and medicaid should pick up the rest Joellen Jersey

## 2012-09-12 ENCOUNTER — Encounter (HOSPITAL_COMMUNITY): Payer: Medicare Other

## 2012-09-14 ENCOUNTER — Telehealth: Payer: Self-pay | Admitting: Internal Medicine

## 2012-09-14 DIAGNOSIS — R918 Other nonspecific abnormal finding of lung field: Secondary | ICD-10-CM

## 2012-09-14 DIAGNOSIS — R911 Solitary pulmonary nodule: Secondary | ICD-10-CM

## 2012-09-14 NOTE — Telephone Encounter (Signed)
Jasmine [name]  Patient went ofr PET scan few days ago and because of high difficult to control sugars it was canceled. Radiology asked to cancel order in EPIC. Can you do that please ?  INstead get CT scan chest wo contrast (ordered).   Give fu after ct  Dr. Brand Males, M.D., [facility].C.P Pulmonary and Critical Care Medicine Staff Physician [place] [facility] Pager: [phone], If no answer or between  15:00h - 7:00h: call [phone]  09/14/2012 5:58 PM

## 2012-09-20 ENCOUNTER — Ambulatory Visit (INDEPENDENT_AMBULATORY_CARE_PROVIDER_SITE_OTHER)
Admission: RE | Admit: 2012-09-20 | Discharge: 2012-09-20 | Disposition: A | Discharge status: Home / Self Care | Payer: Medicare Other | Source: Ambulatory Visit | Attending: Internal Medicine | Admitting: Internal Medicine

## 2012-09-20 DIAGNOSIS — R918 Other nonspecific abnormal finding of lung field: Secondary | ICD-10-CM

## 2012-09-24 ENCOUNTER — Telehealth: Payer: Self-pay | Admitting: Internal Medicine

## 2012-09-24 ENCOUNTER — Encounter (HOSPITAL_COMMUNITY): Payer: Self-pay

## 2012-09-24 ENCOUNTER — Inpatient Hospital Stay (HOSPITAL_COMMUNITY)
Admission: EM | Admit: 2012-09-24 | Discharge: 2012-09-27 | DRG: 638 | Disposition: A | Discharge status: Home / Self Care | Payer: Medicare Other | Attending: Internal Medicine | Admitting: Internal Medicine

## 2012-09-24 ENCOUNTER — Inpatient Hospital Stay (HOSPITAL_COMMUNITY): Payer: Medicare Other

## 2012-09-24 DIAGNOSIS — E1065 Type 1 diabetes mellitus with hyperglycemia: Principal | ICD-10-CM | POA: Diagnosis present

## 2012-09-24 DIAGNOSIS — E875 Hyperkalemia: Secondary | ICD-10-CM | POA: Diagnosis present

## 2012-09-24 DIAGNOSIS — E860 Dehydration: Secondary | ICD-10-CM | POA: Diagnosis present

## 2012-09-24 DIAGNOSIS — R748 Abnormal levels of other serum enzymes: Secondary | ICD-10-CM

## 2012-09-24 DIAGNOSIS — N183 Chronic kidney disease, stage 3 unspecified: Secondary | ICD-10-CM | POA: Diagnosis present

## 2012-09-24 DIAGNOSIS — I129 Hypertensive chronic kidney disease with stage 1 through stage 4 chronic kidney disease, or unspecified chronic kidney disease: Secondary | ICD-10-CM | POA: Diagnosis present

## 2012-09-24 DIAGNOSIS — J849 Interstitial pulmonary disease, unspecified: Secondary | ICD-10-CM

## 2012-09-24 DIAGNOSIS — Z794 Long term (current) use of insulin: Secondary | ICD-10-CM

## 2012-09-24 DIAGNOSIS — I10 Essential (primary) hypertension: Secondary | ICD-10-CM

## 2012-09-24 DIAGNOSIS — F411 Generalized anxiety disorder: Secondary | ICD-10-CM | POA: Diagnosis present

## 2012-09-24 DIAGNOSIS — Z8249 Family history of ischemic heart disease and other diseases of the circulatory system: Secondary | ICD-10-CM

## 2012-09-24 DIAGNOSIS — F319 Bipolar disorder, unspecified: Secondary | ICD-10-CM | POA: Diagnosis present

## 2012-09-24 DIAGNOSIS — Z7982 Long term (current) use of aspirin: Secondary | ICD-10-CM

## 2012-09-24 DIAGNOSIS — J069 Acute upper respiratory infection, unspecified: Secondary | ICD-10-CM | POA: Diagnosis present

## 2012-09-24 DIAGNOSIS — E039 Hypothyroidism, unspecified: Secondary | ICD-10-CM | POA: Diagnosis present

## 2012-09-24 DIAGNOSIS — K219 Gastro-esophageal reflux disease without esophagitis: Secondary | ICD-10-CM | POA: Diagnosis present

## 2012-09-24 DIAGNOSIS — Z6841 Body Mass Index (BMI) 40.0 and over, adult: Secondary | ICD-10-CM

## 2012-09-24 DIAGNOSIS — F17200 Nicotine dependence, unspecified, uncomplicated: Secondary | ICD-10-CM | POA: Diagnosis present

## 2012-09-24 DIAGNOSIS — Z803 Family history of malignant neoplasm of breast: Secondary | ICD-10-CM

## 2012-09-24 DIAGNOSIS — Z720 Tobacco use: Secondary | ICD-10-CM | POA: Diagnosis present

## 2012-09-24 DIAGNOSIS — R739 Hyperglycemia, unspecified: Secondary | ICD-10-CM

## 2012-09-24 DIAGNOSIS — F3181 Bipolar II disorder: Secondary | ICD-10-CM | POA: Diagnosis present

## 2012-09-24 DIAGNOSIS — K529 Noninfective gastroenteritis and colitis, unspecified: Secondary | ICD-10-CM

## 2012-09-24 DIAGNOSIS — Z825 Family history of asthma and other chronic lower respiratory diseases: Secondary | ICD-10-CM

## 2012-09-24 DIAGNOSIS — J8410 Pulmonary fibrosis, unspecified: Secondary | ICD-10-CM | POA: Diagnosis present

## 2012-09-24 DIAGNOSIS — Z79899 Other long term (current) drug therapy: Secondary | ICD-10-CM

## 2012-09-24 DIAGNOSIS — N179 Acute kidney failure, unspecified: Secondary | ICD-10-CM | POA: Diagnosis present

## 2012-09-24 DIAGNOSIS — E66813 Obesity, class 3: Secondary | ICD-10-CM | POA: Diagnosis present

## 2012-09-24 DIAGNOSIS — N182 Chronic kidney disease, stage 2 (mild): Secondary | ICD-10-CM

## 2012-09-24 DIAGNOSIS — IMO0002 Reserved for concepts with insufficient information to code with codable children: Principal | ICD-10-CM | POA: Diagnosis present

## 2012-09-24 DIAGNOSIS — E1010 Type 1 diabetes mellitus with ketoacidosis without coma: Secondary | ICD-10-CM

## 2012-09-24 DIAGNOSIS — Z801 Family history of malignant neoplasm of trachea, bronchus and lung: Secondary | ICD-10-CM

## 2012-09-24 DIAGNOSIS — N393 Stress incontinence (female) (male): Secondary | ICD-10-CM

## 2012-09-24 DIAGNOSIS — G4733 Obstructive sleep apnea (adult) (pediatric): Secondary | ICD-10-CM | POA: Diagnosis present

## 2012-09-24 DIAGNOSIS — E871 Hypo-osmolality and hyponatremia: Secondary | ICD-10-CM

## 2012-09-24 DIAGNOSIS — Z91199 Patient's noncompliance with other medical treatment and regimen due to unspecified reason: Secondary | ICD-10-CM

## 2012-09-24 DIAGNOSIS — Z9119 Patient's noncompliance with other medical treatment and regimen: Secondary | ICD-10-CM

## 2012-09-24 LAB — CBC WITH DIFFERENTIAL/PLATELET
Basophils Absolute: 0 10*3/uL (ref 0.0–0.1)
Eosinophils Absolute: 0.4 10*3/uL (ref 0.0–0.7)
Eosinophils Relative: 5 % (ref 0–5)
Hemoglobin: 12 g/dL (ref 12.0–15.0)
Lymphs Abs: 1.9 10*3/uL (ref 0.7–4.0)
MCHC: 33.3 g/dL (ref 30.0–36.0)
Monocytes Absolute: 0.7 10*3/uL (ref 0.1–1.0)
Neutrophils Relative %: 65 % (ref 43–77)
RBC: 3.91 MIL/uL (ref 3.87–5.11)
WBC: 9.1 10*3/uL (ref 4.0–10.5)

## 2012-09-24 LAB — GLUCOSE, CAPILLARY
Glucose-Capillary: 569 mg/dL (ref 70–99)
Glucose-Capillary: 468 mg/dL — ABNORMAL HIGH (ref 70–99)
Glucose-Capillary: 324 mg/dL — ABNORMAL HIGH (ref 70–99)
Glucose-Capillary: 178 mg/dL — ABNORMAL HIGH (ref 70–99)
Glucose-Capillary: 98 mg/dL (ref 70–99)
Glucose-Capillary: 31 mg/dL — CL (ref 70–99)
Glucose-Capillary: 208 mg/dL — ABNORMAL HIGH (ref 70–99)
Glucose-Capillary: 173 mg/dL — ABNORMAL HIGH (ref 70–99)
Glucose-Capillary: 175 mg/dL — ABNORMAL HIGH (ref 70–99)
Glucose-Capillary: 148 mg/dL — ABNORMAL HIGH (ref 70–99)
Glucose-Capillary: 117 mg/dL — ABNORMAL HIGH (ref 70–99)
Glucose-Capillary: 212 mg/dL — ABNORMAL HIGH (ref 70–99)
Glucose-Capillary: 63 mg/dL — ABNORMAL LOW (ref 70–99)
Glucose-Capillary: 65 mg/dL — ABNORMAL LOW (ref 70–99)
Glucose-Capillary: 76 mg/dL (ref 70–99)

## 2012-09-24 LAB — BLOOD GAS, ARTERIAL
FIO2: 0.21 %
pCO2 arterial: 37.7 mmHg (ref 35.0–45.0)
pH, Arterial: 7.355 (ref 7.350–7.450)
pO2, Arterial: 85.1 mmHg (ref 80.0–100.0)

## 2012-09-24 LAB — URINE MICROSCOPIC-ADD ON

## 2012-09-24 LAB — HEPATIC FUNCTION PANEL
ALT: 11 U/L (ref 0–35)
AST: 14 U/L (ref 0–37)
Alkaline Phosphatase: 90 U/L (ref 39–117)
Bilirubin, Direct: <0.1 mg/dL (ref 0.0–0.3)
Total Bilirubin: 0.3 mg/dL (ref 0.3–1.2)

## 2012-09-24 LAB — BASIC METABOLIC PANEL
CO2: 22 mEq/L (ref 19–32)
Calcium: 8.4 mg/dL (ref 8.4–10.5)
Creatinine, Ser: 1.55 mg/dL — ABNORMAL HIGH (ref 0.50–1.10)
GFR calc non Af Amer: 41 mL/min — ABNORMAL LOW (ref >90)
Glucose, Bld: 518 mg/dL — ABNORMAL HIGH (ref 70–99)
Sodium: 128 mEq/L — ABNORMAL LOW (ref 135–145)

## 2012-09-24 LAB — URINALYSIS, ROUTINE W REFLEX MICROSCOPIC
Glucose, UA: >1000 mg/dL — AB
Hgb urine dipstick: NEGATIVE
Protein, ur: NEGATIVE mg/dL
Urobilinogen, UA: 0.2 mg/dL (ref 0.0–1.0)

## 2012-09-24 MED ORDER — DEXTROSE-NACL 5-0.45 % IV SOLN
INTRAVENOUS | Status: DC
  Administered 2012-09-24: 10:00:00 via INTRAVENOUS

## 2012-09-24 MED ORDER — INSULIN ASPART 100 UNIT/ML ~~LOC~~ SOLN: 0.0000 [IU] | Freq: Three times a day (TID) | SUBCUTANEOUS | Status: DC

## 2012-09-24 MED ORDER — ONDANSETRON HCL 4 MG/2ML IJ SOLN
4.0000 mg | Freq: Once | INTRAMUSCULAR | Status: AC
  Administered 2012-09-24: 4 mg via INTRAVENOUS
  Filled 2012-09-24: qty 2

## 2012-09-24 MED ORDER — FLEET ENEMA 7-19 GM/118ML RE ENEM: 1.0000 | ENEMA | Freq: Once | RECTAL | Status: AC | PRN

## 2012-09-24 MED ORDER — ACETAMINOPHEN 325 MG PO TABS
650.0000 mg | ORAL_TABLET | Freq: Four times a day (QID) | ORAL | Status: DC | PRN
  Administered 2012-09-24 – 2012-09-25 (×2): 650 mg via ORAL
  Filled 2012-09-24 (×2): qty 2

## 2012-09-24 MED ORDER — ONDANSETRON HCL 4 MG/2ML IJ SOLN
4.0000 mg | INTRAMUSCULAR | Status: DC | PRN
  Administered 2012-09-25: 4 mg via INTRAVENOUS
  Filled 2012-09-24: qty 2

## 2012-09-24 MED ORDER — CARVEDILOL 12.5 MG PO TABS
25.0000 mg | ORAL_TABLET | Freq: Two times a day (BID) | ORAL | Status: DC
  Administered 2012-09-24 – 2012-09-27 (×7): 25 mg via ORAL
  Filled 2012-09-24 (×7): qty 2

## 2012-09-24 MED ORDER — SODIUM CHLORIDE 0.9 % IJ SOLN
3.0000 mL | Freq: Two times a day (BID) | INTRAMUSCULAR | Status: DC
  Administered 2012-09-26: 3 mL via INTRAVENOUS

## 2012-09-24 MED ORDER — SODIUM CHLORIDE 0.9 % IV SOLN
INTRAVENOUS | Status: DC
  Administered 2012-09-24: 4.4 [IU]/h via INTRAVENOUS
  Administered 2012-09-24: 4.1 [IU]/h via INTRAVENOUS
  Filled 2012-09-24: qty 1

## 2012-09-24 MED ORDER — ASPIRIN EC 81 MG PO TBEC
81.0000 mg | DELAYED_RELEASE_TABLET | Freq: Every day | ORAL | Status: DC
  Administered 2012-09-24 – 2012-09-27 (×4): 81 mg via ORAL
  Filled 2012-09-24 (×4): qty 1

## 2012-09-24 MED ORDER — ENOXAPARIN SODIUM 40 MG/0.4ML ~~LOC~~ SOLN
40.0000 mg | SUBCUTANEOUS | Status: DC
  Administered 2012-09-24 – 2012-09-25 (×2): 40 mg via SUBCUTANEOUS
  Filled 2012-09-24 (×2): qty 0.4

## 2012-09-24 MED ORDER — INSULIN ASPART 100 UNIT/ML ~~LOC~~ SOLN: 0.0000 [IU] | Freq: Every day | SUBCUTANEOUS | Status: DC

## 2012-09-24 MED ORDER — INSULIN GLARGINE 100 UNIT/ML ~~LOC~~ SOLN
24.0000 [IU] | Freq: Once | SUBCUTANEOUS | Status: AC
  Administered 2012-09-24: 24 [IU] via SUBCUTANEOUS
  Filled 2012-09-24: qty 0.24

## 2012-09-24 MED ORDER — LEVOTHYROXINE SODIUM 100 MCG PO TABS
100.0000 ug | ORAL_TABLET | Freq: Every day | ORAL | Status: DC
  Administered 2012-09-24 – 2012-09-27 (×4): 100 ug via ORAL
  Filled 2012-09-24 (×4): qty 1

## 2012-09-24 MED ORDER — LAMOTRIGINE 100 MG PO TABS
100.0000 mg | ORAL_TABLET | Freq: Every day | ORAL | Status: DC
  Administered 2012-09-24 – 2012-09-27 (×4): 100 mg via ORAL
  Filled 2012-09-24 (×6): qty 1

## 2012-09-24 MED ORDER — INSULIN GLARGINE 100 UNIT/ML ~~LOC~~ SOLN
SUBCUTANEOUS | Status: AC
  Filled 2012-09-24: qty 10

## 2012-09-24 MED ORDER — SIMVASTATIN 20 MG PO TABS
40.0000 mg | ORAL_TABLET | Freq: Every day | ORAL | Status: DC
  Administered 2012-09-24 – 2012-09-26 (×3): 40 mg via ORAL
  Filled 2012-09-24 (×4): qty 2

## 2012-09-24 MED ORDER — SODIUM CHLORIDE 0.9 % IV SOLN
INTRAVENOUS | Status: DC
  Administered 2012-09-24: 09:00:00 via INTRAVENOUS

## 2012-09-24 MED ORDER — SODIUM CHLORIDE 0.9 % IV SOLN
Freq: Once | INTRAVENOUS | Status: AC
  Administered 2012-09-24: 05:00:00 via INTRAVENOUS

## 2012-09-24 MED ORDER — TRAZODONE HCL 50 MG PO TABS: 100.0000 mg | ORAL_TABLET | Freq: Every evening | ORAL | Status: DC | PRN

## 2012-09-24 MED ORDER — DEXTROSE 50 % IV SOLN
INTRAVENOUS | Status: AC
  Administered 2012-09-24: 25 mL via INTRAVENOUS
  Filled 2012-09-24: qty 50

## 2012-09-24 MED ORDER — DEXTROSE 50 % IV SOLN: 25.0000 mL | INTRAVENOUS | Status: DC | PRN

## 2012-09-24 MED ORDER — SORBITOL 70 % SOLN: 30.0000 mL | Freq: Every day | Status: DC | PRN

## 2012-09-24 MED ORDER — INSULIN ASPART 100 UNIT/ML ~~LOC~~ SOLN
10.0000 [IU] | Freq: Once | SUBCUTANEOUS | Status: AC
  Administered 2012-09-24: 10 [IU] via INTRAVENOUS
  Filled 2012-09-24: qty 1

## 2012-09-24 MED ORDER — ACETAMINOPHEN 500 MG PO TABS: 1000.0000 mg | ORAL_TABLET | Freq: Four times a day (QID) | ORAL | Status: DC | PRN

## 2012-09-24 MED ORDER — INSULIN PUMP
Freq: Three times a day (TID) | SUBCUTANEOUS | Status: DC
  Filled 2012-09-24: qty 1

## 2012-09-24 MED ORDER — CLONAZEPAM 0.5 MG PO TABS
1.0000 mg | ORAL_TABLET | Freq: Every day | ORAL | Status: DC
  Administered 2012-09-24 – 2012-09-26 (×3): 1 mg via ORAL
  Filled 2012-09-24 (×3): qty 2

## 2012-09-24 MED ORDER — INSULIN REGULAR BOLUS VIA INFUSION
0.0000 [IU] | Freq: Three times a day (TID) | INTRAVENOUS | Status: DC
  Filled 2012-09-24: qty 10

## 2012-09-24 MED ORDER — INSULIN REGULAR HUMAN 100 UNIT/ML IJ SOLN
INTRAMUSCULAR | Status: AC
  Filled 2012-09-24: qty 3

## 2012-09-24 MED ORDER — SODIUM CHLORIDE 0.9 % IV BOLUS (SEPSIS)
1000.0000 mL | Freq: Once | INTRAVENOUS | Status: AC
  Administered 2012-09-24: 1000 mL via INTRAVENOUS

## 2012-09-24 MED ORDER — LISINOPRIL 10 MG PO TABS
20.0000 mg | ORAL_TABLET | Freq: Two times a day (BID) | ORAL | Status: DC
  Administered 2012-09-24 (×2): 20 mg via ORAL
  Filled 2012-09-24 (×2): qty 2

## 2012-09-24 MED ORDER — ALBUTEROL SULFATE HFA 108 (90 BASE) MCG/ACT IN AERS: 2.0000 | INHALATION_SPRAY | Freq: Four times a day (QID) | RESPIRATORY_TRACT | Status: DC | PRN

## 2012-09-24 NOTE — H&P (Signed)
[facility] History and Physical  Jasmine [name]  [id number]  DOB: September 29, 1970   DOA: 09/24/2012   PCP:   Tula Nakayama  Nephrologist: Dr. Lorrene Reid, [facility] Endocrinologist: Iran Planas, [facility]  Chief Complaint:  Hyperglycemia since today  HPI: Jasmine [name] is an 42 y.o. female.   Morbidly obese Caucasian lady with diabetes type 1, managed with an insulin pump, and other comorbidities as listed below including chronic kidney and hypertension. Current medications include HCTZ.  She has no fever nor chills; she continues to have postnasal drip. Today she is noted that her blood sugars have been rising steadily despite increasing doses of insulin, and decided to come to the emergency room before matters got worse. In the emergency room initial CBG was greater than 600. There is no evidence of ketosis; her ABG is normal.  She is been having an upper respiratory infection for one week for which she has completed a course of Z-Pak, but hoarseness remains.  She also acknowledges that her renal function appears to be improving. Past records indicate a serum creatinine ranging between 1.8 and 2.0, today serum creatinine is 1.55   she complains of thirst   Rewiew of Systems:   All systems negative except as marked bold or noted in the HPI;  Constitutional:    malaise, fever and chills. ;  Eyes:   eye pain, redness and discharge. ;  ENMT:   ear pain, hoarseness, nasal congestion, sinus pressure and sore throat. ;  Cardiovascular:    chest pain, palpitations, diaphoresis, dyspnea and peripheral edema.  Respiratory:   cough, hemoptysis, wheezing and stridor. ;  Gastrointestinal:  nausea, vomiting, diarrhea, constipation, abdominal pain, melena, blood in stool, hematemesis, jaundice and rectal bleeding. unusual weight loss..   Genitourinary:    frequency, dysuria, incontinence,flank pain and hematuria; Musculoskeletal:   back pain and neck pain.   swelling and trauma.;  Skin: .  pruritus, rash, abrasions, bruising and skin lesion.; ulcerations Neuro:    headache, lightheadedness and neck stiffness.  weakness, altered level of consciousness, altered mental status, extremity weakness, burning feet, involuntary movement, seizure and syncope.  Psych:    anxiety, depression, insomnia, tearfulness, panic attacks, hallucinations, paranoia, suicidal or homicidal ideation    Past Medical History  Diagnosis Date  . Hypertension   . Diabetes mellitus   . Bipolar disorder   . CKD (chronic kidney disease), stage II   . Elevated cholesterol   . GERD (gastroesophageal reflux disease)   . Stress incontinence   . Hypothyroidism   . Sleep apnea     HAS C -PAP / DOES NOT USE  . Anxiety   . Depression   . UTI (lower urinary tract infection)   . Tobacco abuse   . Obesity   . DKA, type 1 11/04/2011    Past Surgical History  Procedure Laterality Date  . Ovary removed    . Nasal fracture surgery    . Uterine fibroid surgery  2001  . Pubovaginal sling  08/16/2011    Procedure: [name];  Surgeon: Bernestine Amass, MD;  Location: [facility];  Service: Urology;  Laterality: N/A;         . Incontinence surgery      Medications:  HOME MEDS: Prior to Admission medications   Medication Sig Start Date End Date Taking? Authorizing Provider  acetaminophen (TYLENOL) 500 MG tablet Take 1,000 mg by mouth every 6 (six) hours as needed. For pain   Yes Historical Provider, MD  albuterol (PROVENTIL HFA;VENTOLIN HFA) 108 (90 BASE) MCG/ACT inhaler Inhale 2 puffs into the lungs every 6 (six) hours as needed for wheezing.   Yes Historical Provider, MD  aspirin EC 81 MG tablet Take 81 mg by mouth daily.   Yes Historical Provider, MD  carvedilol (COREG) 25 MG tablet Take 25 mg by mouth 2 (two) times daily with a meal.    Yes Historical Provider, MD  cholecalciferol (VITAMIN D) 1000 UNITS tablet Take 1,000 Units by mouth daily.   Yes Historical Provider,  MD  clonazePAM (KLONOPIN) 1 MG tablet Take 1 mg by mouth at bedtime.   Yes Historical Provider, MD  Flaxseed, Linseed, (FLAX SEED OIL) 1000 MG CAPS Take 1 capsule by mouth daily.   Yes Historical Provider, MD  folic acid (FOLVITE) Q000111Q MCG tablet Take 800 mcg by mouth daily.   Yes Historical Provider, MD  hydrochlorothiazide (HYDRODIURIL) 25 MG tablet Take 25 mg by mouth daily.   Yes Historical Provider, MD  insulin aspart (NOVOLOG) 100 UNIT/ML injection Inject 2.5 Units into the skin every hour. On insulin pump 09/02/12  Yes Nimish C Anastasio Champion, MD  lamoTRIgine (LAMICTAL) 100 MG tablet Take 100 mg by mouth daily.   Yes Historical Provider, MD  levothyroxine (SYNTHROID, LEVOTHROID) 100 MCG tablet Take 100 mcg by mouth daily.   Yes Historical Provider, MD  lisinopril (PRINIVIL,ZESTRIL) 20 MG tablet Take 20 mg by mouth 2 (two) times daily.    Yes Historical Provider, MD  mirtazapine (REMERON) 45 MG tablet Take 45 mg by mouth at bedtime.   Yes Historical Provider, MD  pravastatin (PRAVACHOL) 80 MG tablet Take 80 mg by mouth at bedtime.   Yes Historical Provider, MD  budesonide-formoterol (SYMBICORT) 160-4.5 MCG/ACT inhaler Inhale 2 puffs into the lungs 2 (two) times daily.    Historical Provider, MD  insulin aspart (NOVOLOG) 100 UNIT/ML injection Inject 0-5 Units into the skin at bedtime. 09/02/12   Nimish Luther Parody, MD  insulin glargine (LANTUS) 100 UNIT/ML injection Inject 0.6 mLs (60 Units total) into the skin daily. 09/02/12   Doree Albee, MD     Allergies:  Allergies  Allergen Reactions  . Advair Diskus (Fluticasone-Salmeterol)     thrush  . Biaxin (Clarithromycin)     Social History:   reports that she has been smoking Cigarettes.  She has a 10 pack-year smoking history. She does not have any smokeless tobacco history on file. She reports that she does not drink alcohol or use illicit drugs.  Family History: Family History  Problem Relation Age of Onset  . Asthma Mother   . Heart  disease Father   . Cancer Paternal Grandmother     lung and breast  . Bladder Cancer Paternal Grandfather      Physical Exam: Filed Vitals:   09/24/12 0354  BP: 141/76  Pulse: 80  Temp: 98.3 F (36.8 C)  TempSrc: Oral  Resp: 20  Height: 5\' 10"  (1.778 m)  Weight: 133.811 kg (295 lb)  SpO2: 95%   Blood pressure 141/76, pulse 80, temperature 98.3 F (36.8 C), temperature source Oral, resp. rate 20, height 5\' 10"  (1.778 m), weight 133.811 kg (295 lb), SpO2 95.00%.  GEN:  Pleasant  young morbidly obese Caucasian lady lying bed in no acute distress, coughing frequently; cooperative with exam PSYCH:  alert and oriented x4;  anxious or depressed; affect is appropriate. HEENT: Mucous membranes pink, dry , and anicteric; PERRLA; EOM intact; no cervical lymphadenopathy nor thyromegaly or carotid bruit; no JVD;  Breasts:: Not examined CHEST WALL: No tenderness CHEST: Normal respiration, clear to auscultation bilaterally HEART: Regular rate and rhythm; no murmurs rubs or gallops BACK: No kyphosis no scoliosis; no CVA tenderness ABDOMEN: Obese, soft non-tender; no masses, no organomegaly, normal abdominal bowel sounds;  Rectal Exam: Not done EXTREMITIES: age-appropriate arthropathy of the hands and knees; no edema; no ulcerations. Genitalia: not examined PULSES: 2+ and symmetric SKIN: Normal hydration no rash or ulceration CNS: Cranial nerves 2-12 grossly intact no focal lateralizing neurologic deficit   Labs on Admission:  Basic Metabolic Panel:  Recent Labs Lab 09/24/12 0506  NA 128*  K 5.0  CL 97  CO2 22  GLUCOSE 518*  BUN 20  CREATININE 1.55*  CALCIUM 8.4   Liver Function Tests: No results found for this basename: AST, ALT, ALKPHOS, BILITOT, PROT, ALBUMIN,  in the last 168 hours No results found for this basename: LIPASE, AMYLASE,  in the last 168 hours No results found for this basename: AMMONIA,  in the last 168 hours CBC:  Recent Labs Lab 09/24/12 0506  WBC 9.1   NEUTROABS 5.9  HGB 12.0  HCT 36.0  MCV 92.1  PLT 224   Cardiac Enzymes: No results found for this basename: CKTOTAL, CKMB, CKMBINDEX, TROPONINI,  in the last 168 hours BNP: No components found with this basename: POCBNP,  D-dimer: No components found with this basename: D-DIMER,  CBG:  Recent Labs Lab 09/24/12 0358 09/24/12 0453 09/24/12 0555  GLUCAP 569* 468* 415*    Radiological Exams on Admission: No results found.    Assessment/Plan Present on Admission:  . DM (diabetes mellitus), type 1, uncontrolled . CKD (chronic kidney disease), stage II . Tobacco abuse . Hypothyroidism . Morbid obesity . HTN (hypertension) . Bipolar disorder . URI (upper respiratory infection)   PLAN:  we'll admit her to the step down unit for continuous insulin infusion via Glucomander. Will given additional IV fluid bolus now then continue her with normal saline at 250 an hour  Clear liquid diet, with insulin supplement; consider advancing as tolerated   Counsel on the importance of nicotine cessation, and normalizing her weight for optimum health Nicotine patch and nutrition consult  Discussed the use of HCTZ in management of her hypertension and kidney disease; likely not very effective antihypertensive at her level of kidney disease, but does interfere with glucose management. Will hold HCTZ for now since he is dehydrated but in any event would recommend discontinuing it altogether. Also advised that insulin requirements may increase as her renal function improves Also advised that insulin requirements will decrease as her weight decreased  Other plans as per orders.  Code Status:  full Family Communication:  plans discussed with patient and father at bedside Disposition Plan:  likely home in a day or 2    Caryl Fate Nocturnist [facility] Pager [phone]   09/24/2012, 6:26 AM

## 2012-09-24 NOTE — Progress Notes (Signed)
At time of d/c insulin gtt CBG was 65; gave patient 15 grams of CHO; repeated CBG 15 min later and it was 63; gave patient 15 grams of CHO; rechecked CBG and it was 76. Will continue to monitor closely; plan on rechecking CBG at 2am

## 2012-09-24 NOTE — Progress Notes (Signed)
CBG 31. INSULIN DRIP STOPPED. D50 25ML GIVEN IV PUSH AS ORDERED PER PROTOCOL.PT IS ALSO JUST STARTING TO EAT LUNCH.

## 2012-09-24 NOTE — ED Provider Notes (Signed)
History     CSN: [id number]  Arrival date & time 09/24/12  [id number]   First MD Initiated Contact with Patient 09/24/12 [phone]      Chief Complaint  Patient presents with  . Hyperglycemia    (Consider location/radiation/quality/duration/timing/severity/associated sxs/prior treatment) HPI Jasmine [name] is a 42 y.o. female with a h/o HTN, bipolar disorder, GERD, who presents to the Emergency Department complaining of glucose is "out of control". Patient is diabetic I on insulin pump x 2 years and managed by Dr. [name] of [facility]. Last night her sugar began going up at 11 PM and she adjusted the insulin pump without success. Denies fever, chills, nausea, vomiting, diarrhea, shortness of breath, cough.  PCP Dr. Deatra Ina Endocrinology Dr. [name]  Past Medical History  Diagnosis Date  . Hypertension   . Diabetes mellitus   . Bipolar disorder   . CKD (chronic kidney disease), stage II   . Elevated cholesterol   . GERD (gastroesophageal reflux disease)   . Stress incontinence   . Hypothyroidism   . Sleep apnea     HAS C -PAP / DOES NOT USE  . Anxiety   . Depression   . UTI (lower urinary tract infection)   . Tobacco abuse   . Obesity   . DKA, type 1 11/04/2011    Past Surgical History  Procedure Laterality Date  . Ovary removed    . Nasal fracture surgery    . Uterine fibroid surgery  2001  . Pubovaginal sling  08/16/2011    Procedure: [name];  Surgeon: Bernestine Amass, MD;  Location: [facility];  Service: Urology;  Laterality: N/A;         . Incontinence surgery      Family History  Problem Relation Age of Onset  . Asthma Mother   . Heart disease Father   . Cancer Paternal Grandmother     lung and breast  . Bladder Cancer Paternal Grandfather     History  Substance Use Topics  . Smoking status: Current Every Day Smoker -- 0.50 packs/day for 20 years    Types: Cigarettes  . Smokeless tobacco: Not on file  . Alcohol Use: No    OB History    Grav Para Term Preterm Abortions TAB SAB Ect Mult Living                  Review of Systems  Constitutional: Negative for fever.       10 Systems reviewed and are negative for acute change except as noted in the HPI.  HENT: Negative for congestion.   Eyes: Negative for discharge and redness.  Respiratory: Negative for cough and shortness of breath.   Cardiovascular: Negative for chest pain.  Gastrointestinal: Negative for vomiting and abdominal pain.  Musculoskeletal: Negative for back pain.  Skin: Negative for rash.  Neurological: Negative for syncope, numbness and headaches.  Psychiatric/Behavioral:       No behavior change.    Allergies  Advair diskus and Biaxin  Home Medications   Current Outpatient Rx  Name  Route  Sig  Dispense  Refill  . acetaminophen (TYLENOL) 500 MG tablet   Oral   Take 1,000 mg by mouth every 6 (six) hours as needed. For pain         . albuterol (PROVENTIL HFA;VENTOLIN HFA) 108 (90 BASE) MCG/ACT inhaler   Inhalation   Inhale 2 puffs into the lungs every 6 (six) hours as needed for wheezing.         Marland Kitchen  aspirin EC 81 MG tablet   Oral   Take 81 mg by mouth daily.         . carvedilol (COREG) 25 MG tablet   Oral   Take 25 mg by mouth 2 (two) times daily with a meal.          . cholecalciferol (VITAMIN D) 1000 UNITS tablet   Oral   Take 1,000 Units by mouth daily.         . clonazePAM (KLONOPIN) 1 MG tablet   Oral   Take 1 mg by mouth at bedtime.         . Flaxseed, Linseed, (FLAX SEED OIL) 1000 MG CAPS   Oral   Take 1 capsule by mouth daily.         . folic acid (FOLVITE) [id number] MCG tablet   Oral   Take 800 mcg by mouth daily.         . hydrochlorothiazide (HYDRODIURIL) 25 MG tablet   Oral   Take 25 mg by mouth daily.         . insulin aspart (NOVOLOG) 100 UNIT/ML injection   Subcutaneous   Inject 2.5 Units into the skin every hour. On insulin pump         . lamoTRIgine (LAMICTAL) 100 MG tablet   Oral    Take 100 mg by mouth daily.         Marland Kitchen levothyroxine (SYNTHROID, LEVOTHROID) 100 MCG tablet   Oral   Take 100 mcg by mouth daily.         Marland Kitchen lisinopril (PRINIVIL,ZESTRIL) 20 MG tablet   Oral   Take 20 mg by mouth 2 (two) times daily.          . mirtazapine (REMERON) 45 MG tablet   Oral   Take 45 mg by mouth at bedtime.         . pravastatin (PRAVACHOL) 80 MG tablet   Oral   Take 80 mg by mouth at bedtime.         . budesonide-formoterol (SYMBICORT) 160-4.5 MCG/ACT inhaler   Inhalation   Inhale 2 puffs into the lungs 2 (two) times daily.         . insulin aspart (NOVOLOG) 100 UNIT/ML injection   Subcutaneous   Inject 0-5 Units into the skin at bedtime.   1 vial   0   . insulin glargine (LANTUS) 100 UNIT/ML injection   Subcutaneous   Inject 0.6 mLs (60 Units total) into the skin daily.   10 mL        BP 141/76  Pulse 80  Temp(Src) 98.3 F (36.8 C) (Oral)  Resp 20  Ht 5\' 10"  (1.778 m)  Wt 295 lb (133.811 kg)  BMI 42.33 kg/m2  SpO2 95%  Physical Exam  Nursing note and vitals reviewed. Constitutional: She appears well-developed and well-nourished.  Awake, alert, nontoxic appearance.  HENT:  Head: Normocephalic and atraumatic.  Right Ear: External ear normal.  Left Ear: External ear normal.  Eyes: EOM are normal. Pupils are equal, round, and reactive to light.  Neck: Normal range of motion. Neck supple.  Cardiovascular: Normal rate and intact distal pulses.   Pulmonary/Chest: Effort normal and breath sounds normal. She exhibits no tenderness.  Abdominal: Soft. Bowel sounds are normal. There is no tenderness. There is no rebound.  Musculoskeletal: She exhibits no tenderness.  Baseline ROM, no obvious new focal weakness.  Neurological:  Mental status and motor strength appears baseline for patient and situation.  Skin: No rash noted.  Psychiatric: She has a normal mood and affect.    ED Course  Procedures (including critical care time) Results  for orders placed during the hospital encounter of 09/24/12  GLUCOSE, CAPILLARY      Result Value Range   Glucose-Capillary 569 (*) 70 - 99 mg/dL   Comment 1 Notify RN    CBC WITH DIFFERENTIAL      Result Value Range   WBC 9.1  4.0 - 10.5 K/uL   RBC 3.91  3.87 - 5.11 MIL/uL   Hemoglobin 12.0  12.0 - 15.0 g/dL   HCT 36.0  36.0 - 46.0 %   MCV 92.1  78.0 - 100.0 fL   MCH 30.7  26.0 - 34.0 pg   MCHC 33.3  30.0 - 36.0 g/dL   RDW 13.0  11.5 - 15.5 %   Platelets 224  150 - 400 K/uL   Neutrophils Relative 65  43 - 77 %   Neutro Abs 5.9  1.7 - 7.7 K/uL   Lymphocytes Relative 21  12 - 46 %   Lymphs Abs 1.9  0.7 - 4.0 K/uL   Monocytes Relative 8  3 - 12 %   Monocytes Absolute 0.7  0.1 - 1.0 K/uL   Eosinophils Relative 5  0 - 5 %   Eosinophils Absolute 0.4  0.0 - 0.7 K/uL   Basophils Relative 0  0 - 1 %   Basophils Absolute 0.0  0.0 - 0.1 K/uL  BASIC METABOLIC PANEL      Result Value Range   Sodium 128 (*) 135 - 145 mEq/L   Potassium 5.0  3.5 - 5.1 mEq/L   Chloride 97  96 - 112 mEq/L   CO2 22  19 - 32 mEq/L   Glucose, Bld 518 (*) 70 - 99 mg/dL   BUN 20  6 - 23 mg/dL   Creatinine, Ser 1.55 (*) 0.50 - 1.10 mg/dL   Calcium 8.4  8.4 - 10.5 mg/dL   GFR calc non Af Amer 41 (*) >90 mL/min   GFR calc Af Amer 47 (*) >90 mL/min  URINALYSIS, ROUTINE W REFLEX MICROSCOPIC      Result Value Range   Color, Urine YELLOW  YELLOW   APPearance CLEAR  CLEAR   Specific Gravity, Urine <1.005 (*) 1.005 - 1.030   pH 6.0  5.0 - 8.0   Glucose, UA >1000 (*) NEGATIVE mg/dL   Hgb urine dipstick NEGATIVE  NEGATIVE   Bilirubin Urine NEGATIVE  NEGATIVE   Ketones, ur TRACE (*) NEGATIVE mg/dL   Protein, ur NEGATIVE  NEGATIVE mg/dL   Urobilinogen, UA 0.2  0.0 - 1.0 mg/dL   Nitrite NEGATIVE  NEGATIVE   Leukocytes, UA NEGATIVE  NEGATIVE  BLOOD GAS, ARTERIAL      Result Value Range   FIO2 0.21     Delivery systems ROOM AIR     pH, Arterial 7.355  7.350 - 7.450   pCO2 arterial 37.7  35.0 - 45.0 mmHg   pO2,  Arterial 85.1  80.0 - 100.0 mmHg   Bicarbonate 20.5  20.0 - 24.0 mEq/L   TCO2 18.9  0 - 100 mmol/L   Acid-base deficit 4.1 (*) 0.0 - 2.0 mmol/L   O2 Saturation 97.3     Patient temperature 37.0     Collection site RADIAL     Drawn by COLLECTED BY RT     Sample type ARTERIAL     Allens test (pass/fail) PASS  PASS  URINE MICROSCOPIC-ADD ON      Result Value Range   Squamous Epithelial / LPF RARE  RARE   WBC, UA 0-2  <3 WBC/hpf   RBC / HPF 0-2  <3 RBC/hpf   Bacteria, UA RARE  RARE  GLUCOSE, CAPILLARY      Result Value Range   Glucose-Capillary 468 (*) 70 - 99 mg/dL      5:46 AM:  T/C to Dr. Megan Salon, hospitalist,  case discussed, including:  HPI, pertinent PM/SHx, VS/PE, dx testing, ED course and treatment.  Agreeable to admission. He has asked that we wait until the BMET has returned and call him back before placing admit orders.   5:47 AM:  T/C from Dr. Megan Salon,  hospitalist.  Agreeable to admission.  Requests to write temporary orders, step down bed..  MDM  Patient with insulin pump whose glucose levels have been elevated and going up since 11 PM. She has given herself 4 boluses without success. Initially given 10 units of insulin and IVF. Minor adjustment in glucose. Will admit the patient. Spoke with Dr. Megan Salon, hospitalist who will place patient in step down. Pt stable in ED with no significant deterioration in condition.The patient appears reasonably stabilized for admission considering the current resources, flow, and capabilities available in the ED at this time, and I doubt any other [facility] requiring further screening and/or treatment in the ED prior to admission.  MDM Reviewed: nursing note and vitals Interpretation: labs           Gypsy Balsam. Olin Hauser, MD 09/24/12 [phone]

## 2012-09-24 NOTE — Telephone Encounter (Signed)
Ct chest 09/20/12 shows huge overall improvement . Please get her in to discuss. No need for any biopsy   Dr. Brand Males, M.D., [facility].C.P Pulmonary and Critical Care Medicine Staff Physician [facility] Pager: [phone], If no answer or between  15:00h - 7:00h: call [phone]  09/24/2012 9:36 PM

## 2012-09-24 NOTE — ED Notes (Signed)
My blood sugar is going up and I can't get it to come down per pt. Started going up around 11 pm (400) and at 3 am (476). I am on an insulin pump and I gave myself 4 insulin bolus since 11 pm, the last bolus was at 2 am of 9.0 units per pt. Feeling nauseated per pt.

## 2012-09-24 NOTE — Progress Notes (Signed)
[facility] PROGRESS NOTE  TARYNE MACARTNEY [id number] DOB: 1971-06-01 DOA: 09/24/2012 PCP: Tula Nakayama Nephrologist: Dr. Lorrene Reid, [facility]  Endocrinologist: Iran Planas, [facility] Pulmonologist: Brand Males, MD   Assessment/Plan: 1. Diabetes mellitus type 1 with uncontrolled hyperglycemia: Blood sugars remain high. Continue insulin infusion. No evidence of DKA. She has no signs or symptoms to suggest acute infection, ACS or serious precipitating event. Continue to monitor. Resume insulin pump when blood sugars better controlled. 2. Recent upper respiratory tract infection: Treated with a Z-Pak prior to admission. Chest x-ray was negative on admission. No hypoxia or tachypnea. 3. Hypertension: Stable. Consider discontinuing hydrochlorothiazide. 4. Chronic kidney disease stage III: Appears stable. 5. Bipolar disorder: 6. Obstructive sleep apnea: Noncompliant with CPAP 7. Anxiety, depression, bipolar disorder: Appears stable. Continue Lamictal, Klonopin. 8. Cigarette smoker: recommend cessation.: 9. Obesity 10. Interstitial lung disease: Followup with pulmonology as an outpatient. Differential diagnosis sarcoidosis or hypersensitivity pneumonitis.  Code Status: Full code Family Communication: none present Disposition Plan: home when improved, likely <48 hours  Murray Hodgkins, MD  [facility]  Pager [phone] If 7PM-7AM, please contact night-coverage at [email or web address], password [facility] 09/24/2012, 7:33 AM  LOS: 0 days   Brief narrative: 42 year old woman with diabetes mellitus type 1 managed on insulin pump presented with rising blood sugars despite increasing doses of insulin. In the emergency Department CBG was greater than 600. There was no proptosis. ABG was unremarkable.  Consultants:    Procedures:    HPI/Subjective: Afebrile, vital signs stable. Some nausea, no vomiting. No abdominal pain, diarrhea, fever. Overall feels some better.  Tolerating liquids. Blood sugars remain high.  Objective: Filed Vitals:   09/24/12 0627 09/24/12 0655 09/24/12 0700 09/24/12 0717  BP: 99/78 112/63 128/62 105/42  Pulse: 79  81 81  Temp:      TempSrc:      Resp: 20  22   Height: 5\' 10"  (1.778 m)   5\' 10"  (1.778 m)  Weight: 135.8 kg (299 lb 6.2 oz)   135.8 kg (299 lb 6.2 oz)  SpO2: 92%  92% 93%    Intake/Output Summary (Last 24 hours) at 09/24/12 0733 Last data filed at 09/24/12 0651  Gross per 24 hour  Intake 326.01 ml  Output      0 ml  Net 326.01 ml   Filed Weights   09/24/12 0354 09/24/12 0627 09/24/12 0717  Weight: 133.811 kg (295 lb) 135.8 kg (299 lb 6.2 oz) 135.8 kg (299 lb 6.2 oz)    Exam:  General:  Appears calm and comfortable Eyes: Appears grossly normal. ENT: grossly normal hearing, lips  Cardiovascular: RRR, no m/r/g. No LE edema. Telemetry: SR, no arrhythmias  Respiratory: CTA bilaterally, no w/r/r. Normal respiratory effort. Abdomen: soft, ntnd Musculoskeletal: grossly normal tone BUE/BLE Psychiatric: grossly normal mood and affect, speech fluent and appropriate Neurologic: grossly non-focal.  Data Reviewed: Basic Metabolic Panel:  Recent Labs Lab 09/24/12 0506  NA 128*  K 5.0  CL 97  CO2 22  GLUCOSE 518*  BUN 20  CREATININE 1.55*  CALCIUM 8.4   CBC:  Recent Labs Lab 09/24/12 0506  WBC 9.1  NEUTROABS 5.9  HGB 12.0  HCT 36.0  MCV 92.1  PLT 224   CBG:  Recent Labs Lab 09/24/12 0358 09/24/12 0453 09/24/12 0555 09/24/12 0650  GLUCAP 569* 468* 415* 350*     Studies: No results found.  Scheduled Meds: . aspirin EC  81 mg Oral Daily  . carvedilol  25 mg Oral BID WC  . clonazePAM  1 mg Oral QHS  . enoxaparin (LOVENOX) injection  40 mg Subcutaneous Q24H  . insulin regular  0-10 Units Intravenous TID WC  . lamoTRIgine  100 mg Oral Daily  . levothyroxine  100 mcg Oral QAC breakfast  . lisinopril  20 mg Oral BID  . simvastatin  40 mg Oral q1800  . sodium chloride  3 mL  Intravenous Q12H   Continuous Infusions: . sodium chloride 150 mL/hr at 09/24/12 0651  . dextrose 5 % and 0.45% NaCl    . insulin (NOVOLIN-R) infusion 8.7 Units/hr (09/24/12 [id number])    Active Problems:   DM (diabetes mellitus), type 1, uncontrolled   CKD (chronic kidney disease), stage II   Hypothyroidism   Tobacco abuse   Morbid obesity   HTN (hypertension)   Bipolar disorder   URI (upper respiratory infection)   Murray Hodgkins, MD  [facility] Pager [phone] If 7PM-7AM, please contact night-coverage at [email or web address], password [facility] 09/24/2012, 7:33 AM  LOS: 0 days   Time spent: 20 minutes

## 2012-09-25 DIAGNOSIS — R7309 Other abnormal glucose: Secondary | ICD-10-CM

## 2012-09-25 DIAGNOSIS — E875 Hyperkalemia: Secondary | ICD-10-CM | POA: Diagnosis present

## 2012-09-25 DIAGNOSIS — E1065 Type 1 diabetes mellitus with hyperglycemia: Principal | ICD-10-CM

## 2012-09-25 LAB — BASIC METABOLIC PANEL
CO2: 22 mEq/L (ref 19–32)
Calcium: 8.6 mg/dL (ref 8.4–10.5)
Chloride: 97 mEq/L (ref 96–112)
Creatinine, Ser: 1.54 mg/dL — ABNORMAL HIGH (ref 0.50–1.10)
GFR calc Af Amer: 48 mL/min — ABNORMAL LOW (ref >90)
Glucose, Bld: 495 mg/dL — ABNORMAL HIGH (ref 70–99)
Potassium: 5.8 mEq/L — ABNORMAL HIGH (ref 3.5–5.1)
Sodium: 130 mEq/L — ABNORMAL LOW (ref 135–145)

## 2012-09-25 LAB — GLUCOSE, CAPILLARY
Glucose-Capillary: 116 mg/dL — ABNORMAL HIGH (ref 70–99)
Glucose-Capillary: 124 mg/dL — ABNORMAL HIGH (ref 70–99)
Glucose-Capillary: 178 mg/dL — ABNORMAL HIGH (ref 70–99)
Glucose-Capillary: 180 mg/dL — ABNORMAL HIGH (ref 70–99)
Glucose-Capillary: 233 mg/dL — ABNORMAL HIGH (ref 70–99)
Glucose-Capillary: 262 mg/dL — ABNORMAL HIGH (ref 70–99)
Glucose-Capillary: 352 mg/dL — ABNORMAL HIGH (ref 70–99)
Glucose-Capillary: 61 mg/dL — ABNORMAL LOW (ref 70–99)
Glucose-Capillary: 96 mg/dL (ref 70–99)

## 2012-09-25 MED ORDER — SODIUM POLYSTYRENE SULFONATE 15 GM/60ML PO SUSP
30.0000 g | Freq: Once | ORAL | Status: AC
  Administered 2012-09-25: 30 g via ORAL
  Filled 2012-09-25: qty 120

## 2012-09-25 MED ORDER — SODIUM CHLORIDE 0.9 % IV SOLN
INTRAVENOUS | Status: DC
  Administered 2012-09-25: 05:00:00 via INTRAVENOUS

## 2012-09-25 MED ORDER — DEXTROSE-NACL 5-0.45 % IV SOLN
INTRAVENOUS | Status: DC
  Administered 2012-09-25: 30 mL via INTRAVENOUS

## 2012-09-25 MED ORDER — SODIUM CHLORIDE 0.9 % IV SOLN
INTRAVENOUS | Status: DC
  Administered 2012-09-25: 4 [IU]/h via INTRAVENOUS
  Filled 2012-09-25: qty 1

## 2012-09-25 MED ORDER — DEXTROSE 50 % IV SOLN: 25.0000 mL | INTRAVENOUS | Status: DC | PRN

## 2012-09-25 MED ORDER — INSULIN REGULAR HUMAN 100 UNIT/ML IJ SOLN
INTRAMUSCULAR | Status: AC
  Filled 2012-09-25: qty 3

## 2012-09-25 MED ORDER — DEXTROSE-NACL 5-0.45 % IV SOLN: INTRAVENOUS | Status: DC

## 2012-09-25 MED ORDER — INSULIN REGULAR BOLUS VIA INFUSION
0.0000 [IU] | Freq: Three times a day (TID) | INTRAVENOUS | Status: DC
  Administered 2012-09-25 (×3): 6 [IU] via INTRAVENOUS
  Filled 2012-09-25: qty 10

## 2012-09-25 NOTE — Telephone Encounter (Signed)
Pt returned [name]'s call.  Jasmine [name]

## 2012-09-25 NOTE — Telephone Encounter (Signed)
Pt is aware of CT scan results. She has been scheduled to see MR on 10/02/12 @ 3:45pm.

## 2012-09-25 NOTE — Progress Notes (Signed)
Hypoglycemic Event  CBG: 61  Treatment: 15 GM carbohydrate snack  Symptoms: Hungry  Follow-up CBG: Time:1331 CBG Result:94  Possible Reasons for Event: Unknown  Comments/MD notified:      Jasmine [name]  Remember to initiate Hypoglycemia Order Set & complete

## 2012-09-25 NOTE — Progress Notes (Signed)
[facility] PROGRESS NOTE  SHAMONICA GOULDING [id number] DOB: [birth year] DOA: 09/24/2012 PCP: Tula Nakayama Nephrologist: Dr. Lorrene Reid, [facility]  Endocrinologist: Iran Planas, [facility] Pulmonologist: Brand Males, MD  Assessment/Plan: 1. Diabetes mellitus type 1 with uncontrolled hyperglycemia: Brittle. Insulin infusion restarted. Will consult endocrinology. There remains no evidence to suggest DKA. She has no signs or symptoms to suggest acute infection, ACS or serious precipitating event. Resume insulin pump when blood sugars better controlled. Hemoglobin A1c 9.8 2. Hyperkalemia: Etiology unclear, no potassium supplementation. Chronic kidney disease appears stable. Discontinue lisinopril and Lovenox. No changes on telemetry. Kayexalate, repeat basic metabolic panel, check EKG. 3. Recent upper respiratory tract infection: Treated with a Z-Pak prior to admission. Chest x-ray was negative on admission. No hypoxia or tachypnea. 4. Hypertension: Stable. Consider discontinuing hydrochlorothiazide. 5. Chronic kidney disease stage III: Appears stable. 6. Bipolar disorder 7. Hypothyroidism, Low TSH: Check T3, T4. May need to decrease supplementation. 8. Obstructive sleep apnea: Noncompliant with CPAP 9. Anxiety, depression, bipolar disorder: Appears stable. Continue Lamictal, Klonopin. 10. Cigarette smoker: recommend cessation. 11. Obesity 12. Interstitial lung disease: Followup with pulmonology as an outpatient. Differential diagnosis sarcoidosis or hypersensitivity pneumonitis.   Code Status: Full code Family Communication: none present Disposition Plan: home when improved, likely <48 hours  Murray Hodgkins, MD  [facility]  Pager [phone] If 7PM-7AM, please contact night-coverage at [email or web address], password [facility] 09/25/2012, 8:58 AM  LOS: 1 day   Brief narrative: 42 year old woman with diabetes mellitus type 1 managed on insulin pump presented with rising  blood sugars despite increasing doses of insulin. In the emergency Department CBG was greater than 600. There was no proptosis. ABG was unremarkable.  Consultants:  Endocrinology  Procedures:    HPI/Subjective: Became hypoglycemic last night. Insulin infusion was discontinued, and she was given 24 units Lantus. Subsequent blood sugars early this morning in the 400s. Afebrile, vital signs stable. Nausea persists but able to tolerate diet. No vomiting. No pain. Some cough.  Objective: Filed Vitals:   09/25/12 0400 09/25/12 0500 09/25/12 0600 09/25/12 0700  BP:      Pulse:      Temp: 98.1 F (36.7 C)   97.9 F (36.6 C)  TempSrc: Oral     Resp: 17 12 18    Height:      Weight:  130.2 kg (287 lb 0.6 oz)    SpO2:        Intake/Output Summary (Last 24 hours) at 09/25/12 0858 Last data filed at 09/25/12 0600  Gross per 24 hour  Intake 1583.82 ml  Output   2500 ml  Net -916.18 ml   Filed Weights   09/24/12 0627 09/24/12 0717 09/25/12 0500  Weight: 135.8 kg (299 lb 6.2 oz) 135.8 kg (299 lb 6.2 oz) 130.2 kg (287 lb 0.6 oz)    Exam:  General:  Appears calm and comfortable Eyes: Appears grossly normal. ENT: grossly normal hearing, lips  Cardiovascular: RRR, no m/r/g. No LE edema. Telemetry: SR, no arrhythmias  Respiratory: CTA bilaterally, no w/r/r. Normal respiratory effort. Musculoskeletal: grossly normal tone BUE/BLE Psychiatric: grossly normal mood and affect, speech fluent and appropriate Neurologic: grossly non-focal.  Exam current 4/21  Data Reviewed: Basic Metabolic Panel:  Recent Labs Lab 09/24/12 0506 09/25/12 0436  NA 128* 130*  K 5.0 5.8*  CL 97 97  CO2 22 22  GLUCOSE 518* 495*  BUN 20 21  CREATININE 1.55* 1.60*  CALCIUM 8.4 8.3*  MG 1.4*  --    CBC:  Recent Labs Lab 09/24/12  0506  WBC 9.1  NEUTROABS 5.9  HGB 12.0  HCT 36.0  MCV 92.1  PLT 224   CBG:  Recent Labs Lab 09/25/12 0015 09/25/12 0435 09/25/12 0549 09/25/12 0650  09/25/12 0804  GLUCAP 230* 460* 427* 352* 262*     Studies: Dg Chest Port 1 View  09/24/2012  *RADIOLOGY REPORT*  Clinical Data: Cough, hyperglycemia  PORTABLE CHEST - 1 VIEW  Comparison: CT chest dated 09/20/2012  Findings: Low lung volumes.  Lungs are essentially clear.  No focal consolidation.  No pleural effusion or pneumothorax.  The heart is normal in size.  IMPRESSION: No evidence of acute cardiopulmonary disease.   Original Report Authenticated By: Julian Hy, M.D.     Scheduled Meds: . aspirin EC  81 mg Oral Daily  . carvedilol  25 mg Oral BID WC  . clonazePAM  1 mg Oral QHS  . enoxaparin (LOVENOX) injection  40 mg Subcutaneous Q24H  . insulin pump   Subcutaneous TID AC, HS, 0200  . insulin regular  0-10 Units Intravenous TID WC  . lamoTRIgine  100 mg Oral Daily  . levothyroxine  100 mcg Oral QAC breakfast  . lisinopril  20 mg Oral BID  . simvastatin  40 mg Oral q1800  . sodium chloride  3 mL Intravenous Q12H   Continuous Infusions: . sodium chloride 50 mL/hr at 09/25/12 0600  . dextrose 5 % and 0.45% NaCl    . insulin (NOVOLIN-R) infusion 5.8 Units/hr (09/25/12 [id number])    Principal Problem:   DM (diabetes mellitus), type 1, uncontrolled Active Problems:   Hypothyroidism   Tobacco abuse   Morbid obesity   HTN (hypertension)   Bipolar disorder   URI (upper respiratory infection)   Hyperkalemia   Chronic kidney disease, stage III (moderate)   Murray Hodgkins, MD  [facility] Pager [phone] If 7PM-7AM, please contact night-coverage at [email or web address], password [facility] 09/25/2012, 8:58 AM  LOS: 1 day   Time spent: 20 minutes

## 2012-09-25 NOTE — Progress Notes (Signed)
Called Dr. Dorris Fetch and he is out of office and will see patient tomorrow. In the mean time he ordered A1C if it has not been performed, as well as, pt is to stay on glucoses stabilizer and chg fluids to D5 1/2 NS at 30 cc/hr.

## 2012-09-25 NOTE — Progress Notes (Signed)
CBG checked at 0400- CBG 460; patient complaining of continued nausea and a headache; paged MD; Patient restarted on insulin gtt/glucostabilizer at this time. Will continue to monitor patient.

## 2012-09-25 NOTE — Telephone Encounter (Signed)
[email or web address].Radhika Castillo, CMA  

## 2012-09-25 NOTE — Progress Notes (Signed)
Inpatient Diabetes Program Recommendations  AACE/ADA: New Consensus Statement on Inpatient Glycemic Control (2013)  Target Ranges:  Prepandial:   less than 140 mg/dL      Peak postprandial:   less than 180 mg/dL (1-2 hours)      Critically ill patients:  140 - 180 mg/dL   Results for Jasmine [name], Jasmine [name] (MRN [id number]) as of 09/25/2012 08:08  Ref. Range 09/25/2012 00:15 09/25/2012 04:35 09/25/2012 05:49 09/25/2012 06:50 09/25/2012 08:04  Glucose-Capillary Latest Range: 70-99 mg/dL 230 (H) 460 (H) 427 (H) 352 (H) 262 (H)    Note: Patient has a history of diabetes since the age of 42 years old.  According to the H&P, patient is noted to have type 1 diabetes, but question whether she has type 2.  Initial blood glucose noted to be 569 mg/dl and patient was not acidotic.   Patient uses an insulin pump with Novolog insulin at home for diabetes management.  Currently, patient is ordered Novolin R IV via insulin drip for inpatient glycemic control.  Patient was transitioned to SQ insulin yesterday at 20:48 at which time she received Lantus 24 units.  However, this morning around 4am patient's blood glucose was greater than 400 mg/dl and the insulin drip was restarted around 5:15am.  I talked with the patient and she states that she noticed that her blood glucose was getting elevated yesterday and she had changed out her infusion set and her insulin reservoir but it did not help bring her blood sugar down so she came to the hospital.   Patient was here in March for hyperglycemia due to a malfunction with her insulin pump.  According to the patient she sees Dr. Iran Planas with [facility] for diabetes management and she actually has an appointment with him tomorrow.  She states that when she left from the last admission, she used Lantus and Novolog to manage her diabetes until she got her replacement pump.  She states that she has had her replacement pump for about a week now and she has not noticed any problems with  it.  She states that she is not sure what is going on this time because the pump appears to be working okay.  The patient reports that she does not have her insulin pump her in the hospital with her.  The settings on the previous insulin pump were: basal requirement 59 units/day, sensitivity 1:30, carb ratio 1:4.  Dr. Sarajane Jews has consulted Dr. Dorris Fetch for inpatient diabetes management.  I informed patient that Dr. Dorris Fetch will be seeing her today and making recommendations for inpatient glycemic control.  Will continue to follow.  Thanks, Barnie Alderman, RN, BSN, [place] Diabetes Coordinator Inpatient Diabetes Program [phone]

## 2012-09-26 LAB — GLUCOSE, CAPILLARY
Glucose-Capillary: 120 mg/dL — ABNORMAL HIGH (ref 70–99)
Glucose-Capillary: 256 mg/dL — ABNORMAL HIGH (ref 70–99)
Glucose-Capillary: 214 mg/dL — ABNORMAL HIGH (ref 70–99)
Glucose-Capillary: 166 mg/dL — ABNORMAL HIGH (ref 70–99)
Glucose-Capillary: 144 mg/dL — ABNORMAL HIGH (ref 70–99)
Glucose-Capillary: 184 mg/dL — ABNORMAL HIGH (ref 70–99)
Glucose-Capillary: 225 mg/dL — ABNORMAL HIGH (ref 70–99)
Glucose-Capillary: 225 mg/dL — ABNORMAL HIGH (ref 70–99)

## 2012-09-26 LAB — BASIC METABOLIC PANEL
CO2: 23 mEq/L (ref 19–32)
Calcium: 8.5 mg/dL (ref 8.4–10.5)
Chloride: 101 mEq/L (ref 96–112)
Creatinine, Ser: 1.3 mg/dL — ABNORMAL HIGH (ref 0.50–1.10)
GFR calc Af Amer: 59 mL/min — ABNORMAL LOW (ref >90)
Sodium: 135 mEq/L (ref 135–145)

## 2012-09-26 LAB — T4, FREE: Free T4: 1.71 ng/dL (ref 0.80–1.80)

## 2012-09-26 MED ORDER — INSULIN ASPART 100 UNIT/ML ~~LOC~~ SOLN
0.0000 [IU] | SUBCUTANEOUS | Status: DC
  Administered 2012-09-26 – 2012-09-27 (×3): 2 [IU] via SUBCUTANEOUS
  Administered 2012-09-27: 3 [IU] via SUBCUTANEOUS

## 2012-09-26 MED ORDER — INSULIN ASPART 100 UNIT/ML ~~LOC~~ SOLN
10.0000 [IU] | Freq: Three times a day (TID) | SUBCUTANEOUS | Status: DC
  Administered 2012-09-26: 10 [IU] via SUBCUTANEOUS

## 2012-09-26 MED ORDER — INSULIN ASPART 100 UNIT/ML ~~LOC~~ SOLN: 0.0000 [IU] | SUBCUTANEOUS | Status: AC

## 2012-09-26 MED ORDER — INSULIN GLARGINE 100 UNIT/ML ~~LOC~~ SOLN
24.0000 [IU] | Freq: Every day | SUBCUTANEOUS | Status: DC
  Administered 2012-09-26: 24 [IU] via SUBCUTANEOUS
  Filled 2012-09-26: qty 0.24

## 2012-09-26 MED ORDER — INSULIN ASPART 100 UNIT/ML ~~LOC~~ SOLN: 0.0000 [IU] | SUBCUTANEOUS | Status: DC

## 2012-09-26 MED ORDER — INSULIN NPH (HUMAN) (ISOPHANE) 100 UNIT/ML ~~LOC~~ SUSP
15.0000 [IU] | Freq: Once | SUBCUTANEOUS | Status: AC
  Administered 2012-09-26: 15 [IU] via SUBCUTANEOUS
  Filled 2012-09-26: qty 10

## 2012-09-26 NOTE — Progress Notes (Signed)
Report called to RN. Pt to be transferred to room 319 per Md order. Pt to be transferred via wheelchair with personal. Insulin pump at bedside.

## 2012-09-26 NOTE — Progress Notes (Signed)
[facility] PROGRESS NOTE  Jasmine [name] [id number] DOB: 1970-11-08 DOA: 09/24/2012 PCP: Tula Nakayama Nephrologist: Dr. Lorrene Reid, [facility]  Endocrinologist: Iran Planas, [facility]  Pulmonologist: Brand Males, MD  Assessment/Plan: 1. Diabetes mellitus type 1 with uncontrolled hyperglycemia: Brittle. Continue insulin infusion, endocrinology consultation pending, NPH, Lantus, meal coverage and sliding scale started last night per endocrinology. No evidence to suggest DKA. She has no signs or symptoms to suggest acute infection, ACS or serious precipitating event. Hemoglobin A1c 9.8 hopefully can transition to insulin pump later today. 2. Hyperkalemia: Resolved status post 2 doses of Kayexalate. Etiology unclear, not on potassium supplementation. ACE inhibitor stopped as was Lovenox. Chronic kidney disease appears stable. Repeat EKG 4/21 was reviewed and showed sinus rhythm with no acute changes.  3. Recent upper respiratory tract infection: Treated with a Z-Pak prior to admission. Chest x-ray was negative on admission. No hypoxia or tachypnea. 4. Hypertension: Stable. Consider discontinuing hydrochlorothiazide. 5. Chronic kidney disease stage III: Appears stable. 6. Bipolar disorder: Stable. 7. Hypothyroidism, Low TSH: Check T3, T4. May need to decrease supplementation. 8. Obstructive sleep apnea: Noncompliant with CPAP 9. Anxiety, depression, bipolar disorder: Appears stable. Continue Lamictal, Klonopin. 10. Cigarette smoker: recommend cessation. 11. Obesity 12. Interstitial lung disease: Followup with pulmonology as an outpatient. Differential diagnosis sarcoidosis or hypersensitivity pneumonitis. Repeat CT prior to admission did demonstrate improvement.   Code Status: Full code Family Communication: none present Disposition Plan: home when improved  Murray Hodgkins, MD  [facility]  Pager [phone] If 7PM-7AM, please contact night-coverage at  [email or web address], password [facility] 09/26/2012, 8:28 AM  LOS: 2 days   Brief narrative: 42 year old woman with diabetes mellitus type 1 managed on insulin pump presented with rising blood sugars despite increasing doses of insulin. In the emergency Department CBG was greater than 600. There was no proptosis. ABG was unremarkable.  Consultants:  Endocrinology  Procedures:    HPI/Subjective: Afebrile. Vital signs stable. Excellent urine output. Blood sugar control better, no hypoglycemia since 1 PM yesterday. No nausea or vomiting. No abdominal pain. No new issues.  Objective: Filed Vitals:   09/26/12 0400 09/26/12 0402 09/26/12 0500 09/26/12 0600  BP:      Pulse:      Temp:  97.5 F (36.4 C)    TempSrc:      Resp: 20  19 20   Height:      Weight:   136.442 kg (300 lb 12.8 oz)   SpO2:        Intake/Output Summary (Last 24 hours) at 09/26/12 0828 Last data filed at 09/26/12 0600  Gross per 24 hour  Intake 1551.72 ml  Output   2900 ml  Net -1348.28 ml   Filed Weights   09/24/12 0717 09/25/12 0500 09/26/12 0500  Weight: 135.8 kg (299 lb 6.2 oz) 130.2 kg (287 lb 0.6 oz) 136.442 kg (300 lb 12.8 oz)    Exam:  General:  Appears calm and comfortable Cardiovascular: RRR, no m/r/g. No LE edema. Telemetry: SR, no arrhythmias  Respiratory: CTA bilaterally, no w/r/r. Normal respiratory effort. Psychiatric: grossly normal mood and affect, speech fluent and appropriate  Data Reviewed: Basic Metabolic Panel:  Recent Labs Lab 09/24/12 0506 09/25/12 0436 09/25/12 1541 09/26/12 0435  NA 128* 130* 131* 135  K 5.0 5.8* 5.2* 4.2  CL 97 97 98 101  CO2 22 22 23 23   GLUCOSE 518* 495* 224* 257*  BUN 20 21 18 14   CREATININE 1.55* 1.60* 1.54* 1.30*  CALCIUM 8.4 8.3* 8.6 8.5  MG 1.4*  --   --   --  CBC:  Recent Labs Lab 09/24/12 0506  WBC 9.1  NEUTROABS 5.9  HGB 12.0  HCT 36.0  MCV 92.1  PLT 224   CBG:  Recent Labs Lab 09/26/12 0321 09/26/12 0450 09/26/12 0553  09/26/12 0657 09/26/12 0759  GLUCAP 180* 256* 214* 166* 144*     Studies: No results found.  Scheduled Meds: . aspirin EC  81 mg Oral Daily  . carvedilol  25 mg Oral BID WC  . clonazePAM  1 mg Oral QHS  . insulin regular  0-10 Units Intravenous TID WC  . lamoTRIgine  100 mg Oral Daily  . levothyroxine  100 mcg Oral QAC breakfast  . simvastatin  40 mg Oral q1800  . sodium chloride  3 mL Intravenous Q12H   Continuous Infusions: . sodium chloride 150 mL/hr at 09/25/12 0935  . dextrose 5 % and 0.45% NaCl    . dextrose 5 % and 0.45% NaCl 30 mL/hr at 09/26/12 0600  . insulin (NOVOLIN-R) infusion 4.6 Units/hr (09/26/12 0600)    Principal Problem:   DM (diabetes mellitus), type 1, uncontrolled Active Problems:   Hypothyroidism   Tobacco abuse   Morbid obesity   HTN (hypertension)   Bipolar disorder   URI (upper respiratory infection)   Hyperkalemia   Chronic kidney disease, stage III (moderate)   Murray Hodgkins, MD  [facility] Pager [phone] If 7PM-7AM, please contact night-coverage at [email or web address], password [facility] 09/26/2012, 8:28 AM  LOS: 2 days   Time spent: 20 minutes

## 2012-09-26 NOTE — Consult Note (Signed)
Jasmine [name] MRN: [id number] DOB/AGE: Oct 16, 1970 42 y.o. Primary Care Physician:KAPLAN,KRISTEN, PA-C Admit date: 09/24/2012 Chief Complaint: Consult for uncontrolled diabetes. HPI:  42 yr old female with multiple medical problems as follows. She is being seen in consultation for uncontrolled diabetes ( said to be type 1 since age 76 yrs), requested by Dr. Sarajane Jews. She has endocrinologist in [place], Dr. [name]. Her hx is that she chronically had problem controllling this diabetes with repeated hospitalization with DKA's. Her current a1c is 9.8% consistent with uncontrolled state. She wears Animas Insulin Pump with inadequate monitoring of 0-2 times a day. She denies CAD, CVA, CKD, retinopathy ,nor neuropathy. She has been overweight to obese most of her adult life. She has a strong family hx of type 1 and type 2 DM. Pt says she has had higher a1c before and this 9.8% is actually an improvement for her. She is in hospital for impending  DKA with  hyperglycemia requiring IV insulin therapy. She insists she likes to continue on her insulin pump, and she already rescheduled a missed appointment with Dr. [name]  In [place].  Past Medical History  Diagnosis Date  . Hypertension   . Diabetes mellitus   . Bipolar disorder   . CKD (chronic kidney disease), stage II   . Elevated cholesterol   . GERD (gastroesophageal reflux disease)   . Stress incontinence   . Hypothyroidism   . Sleep apnea     HAS C -PAP / DOES NOT USE  . Anxiety   . Depression   . UTI (lower urinary tract infection)   . Tobacco abuse   . Obesity   . DKA, type 1 11/04/2011       Family History  Problem Relation Age of Onset  . Asthma Mother   . Heart disease Father   . Cancer Paternal Grandmother     lung and breast  . Bladder Cancer Paternal Grandfather    Social History:  reports that she has been smoking Cigarettes.  She has a 10 pack-year smoking history. She does not have any smokeless tobacco history  on file. She reports that she does not drink alcohol or use illicit drugs.   Allergies:  Allergies  Allergen Reactions  . Advair Diskus (Fluticasone-Salmeterol) Other (See Comments)    Thrush   . Biaxin (Clarithromycin) Rash    Medications Prior to Admission  Medication Sig Dispense Refill  . acetaminophen (TYLENOL) 500 MG tablet Take 1,000 mg by mouth every 6 (six) hours as needed. For pain      . albuterol (PROVENTIL HFA;VENTOLIN HFA) 108 (90 BASE) MCG/ACT inhaler Inhale 2 puffs into the lungs every 6 (six) hours as needed for wheezing.      Marland Kitchen aspirin EC 81 MG tablet Take 81 mg by mouth daily.      . carvedilol (COREG) 25 MG tablet Take 25 mg by mouth 2 (two) times daily with a meal.       . cholecalciferol (VITAMIN D) 1000 UNITS tablet Take 1,000 Units by mouth daily.      . clonazePAM (KLONOPIN) 1 MG tablet Take 1 mg by mouth at bedtime.      . fish oil-omega-3 fatty acids 1000 MG capsule Take 1 g by mouth daily.      . fluticasone (FLONASE) 50 MCG/ACT nasal spray Place 2 sprays into the nose daily as needed for allergies.      . folic acid (FOLVITE) [id number] MCG tablet Take 800 mcg by mouth daily.      Marland Kitchen  hydrochlorothiazide (HYDRODIURIL) 25 MG tablet Take 25 mg by mouth daily.      . insulin aspart (NOVOLOG) 100 UNIT/ML injection Inject into the skin continuous. Insulin Pump      . lamoTRIgine (LAMICTAL) 100 MG tablet Take 100-200 mg by mouth daily. Takes 100 mg in the morning and 200 mg in the evening.      Marland Kitchen levothyroxine (SYNTHROID, LEVOTHROID) 100 MCG tablet Take 100 mcg by mouth daily.      Marland Kitchen lisinopril (PRINIVIL,ZESTRIL) 20 MG tablet Take 20 mg by mouth 2 (two) times daily.       . mirtazapine (REMERON) 45 MG tablet Take 45 mg by mouth at bedtime.      . pravastatin (PRAVACHOL) 80 MG tablet Take 80 mg by mouth at bedtime.      Marland Kitchen VITAMIN E PO Take 1 tablet by mouth daily.      . budesonide-formoterol (SYMBICORT) 160-4.5 MCG/ACT inhaler Inhale 2 puffs into the lungs 2 (two) times  daily.      . insulin glargine (LANTUS) 100 UNIT/ML injection Inject 0.6 mLs (60 Units total) into the skin daily.  10 mL         [id number] from the symptoms mentioned above,there are no other symptoms referable to all systems reviewed.  Physical Exam: Blood pressure 136/85, pulse 75, temperature 97.5 F (36.4 C), temperature source Oral, resp. rate 20, height 5\' 10"  (1.778 m), weight 136.442 kg (300 lb 12.8 oz), SpO2 95.00%.  Gen :well nourished, not in acute distress. HEENT: moist mucus membranes. Chest: CTAB, no wheezes. CVS: No murmur, no gallop Abd: obese, non tender, positive bowel sounds. Ext: No edema. Skin : no rash, no ulcer Neuro: Non focal.   Recent Labs  09/24/12 0506  WBC 9.1  NEUTROABS 5.9  HGB 12.0  HCT 36.0  MCV 92.1  PLT 224    A1C: 9.8%.  Recent Labs  09/24/12 0506  09/25/12 1541 09/26/12 0435  NA 128*  < > 131* 135  K 5.0  < > 5.2* 4.2  CL 97  < > 98 101  CO2 22  < > 23 23  GLUCOSE 518*  < > 224* 257*  BUN 20  < > 18 14  CREATININE 1.55*  < > 1.54* 1.30*  CALCIUM 8.4  < > 8.6 8.5  MG 1.4*  --   --   --   < > = values in this interval not displayed.  Recent Labs  09/24/12 0506  AST 14  ALT 11  ALKPHOS 90  BILITOT 0.3  PROT 5.7*  ALBUMIN 2.7*   No results found for this or any previous visit (from the past 240 hour(s)).  Ct Chest Wo Contrast  09/20/2012  *RADIOLOGY REPORT*  Clinical Data: Evaluate pulmonary infiltrates.  Question sarcoid. Question pulmonary nodule.  CT CHEST WITHOUT CONTRAST  Technique:  Multidetector CT imaging of the chest was performed following the standard protocol without IV contrast.  Comparison: 05/01/2012  Findings: Lungs/pleura: No pleural effusion identified.  Previously noted upper lobe predominant interstitial accentuation appears improved from previous exam.  There are areas of scarring within the medial right upper lobes.  No pulmonary nodularity or airspace consolidation noted.  Heart/Mediastinum: The  heart size is normal.  No pericardial effusion.  9 mm prevascular lymph node is identified, image 15/series 2.  This is unchanged from previous exam.  The subcarinal lymph node measures 1 cm, image 23/series 2.  Previously this measured 1.3 cm.  Right paratracheal lymph node measures  0.7 cm, image 14/series 2.  Previously this measured 1 cm.  No significant hilar adenopathy.  Upper abdomen: Again noted is bilateral perirenal fat stranding. No acute findings noted within the upper abdomen.  Bones/Musculoskeletal:  Review of the visualized bony structures is unremarkable.  IMPRESSION:  1.  No acute findings identified within the abdomen or pelvis. 2.  Interval improvement and upper lobe predominant interstitial accentuation. 3.  Decrease in size of mediastinal lymph nodes.   Original Report Authenticated By: Kerby Moors, M.D.    US Transvaginal Non-ob  09/08/2012  *RADIOLOGY REPORT*  Clinical Data: Vaginal discharge and lower abdominal cramps. Status post oophorectomy for endometriosis. Mirena IUD in place  TRANSABDOMINAL AND TRANSVAGINAL ULTRASOUND OF PELVIS Technique:  Both transabdominal and transvaginal ultrasound examinations of the pelvis were performed. Transabdominal technique was performed for global imaging of the pelvis including uterus, ovaries, adnexal regions, and pelvic cul-de-sac.  It was necessary to proceed with endovaginal exam following the transabdominal exam to visualize the myometrium, endometrium and adnexa.  Comparison:  09/23/2008  Findings:  Uterus: Is anteverted and anteflexed and demonstrates a sagittal length is 7.3 cm, depth of 4.0 cm and width of 5.2 cm.  A mildly heterogeneous myometrium is seen with no focal abnormality identified  Endometrium: The patient's indwelling IUD is in place and appears appropriately positioned with 2-D imaging.  The endometrial lining is obscured by the presence of the IUD.  Right ovary:  The right ovary measures 4.4 x 2.9 x 3.8 cm and contains a mildly  complex cystic lesion measuring 3.6 x 2.3 x 2.4 cm and containing at least one possibly two thin septations.  No flow is identified associated with this cyst with color Doppler exam and it appears to be partially collapsing suggesting it may be functional in nature.  Left ovary: Is not seen with confidence either transabdominally or endovaginally  Other findings: No pelvic fluid is seen.  IMPRESSION: The patient reported that the right ovary had been surgically removed but based on prior ultrasound which demonstrated a complex left ovarian mass and lack of visualization of the left ovary today, prior left oophorectomy is suspected  Slightly complex right ovarian cyst.  Given the imaging features, this is likely benign and functional in nature but short-term reevaluation is recommended in 6-8 weeks for reassessment. If functional, this would likely show resolution over that period.  Appropriate IUD positioning suggested with 2-D imaging.   Original Report Authenticated By: Ponciano Ort, M.D.    US Pelvis Complete  09/01/2012  *RADIOLOGY REPORT*  Clinical Data: Vaginal discharge and lower abdominal cramps. Status post oophorectomy for endometriosis. Mirena IUD in place  TRANSABDOMINAL AND TRANSVAGINAL ULTRASOUND OF PELVIS Technique:  Both transabdominal and transvaginal ultrasound examinations of the pelvis were performed. Transabdominal technique was performed for global imaging of the pelvis including uterus, ovaries, adnexal regions, and pelvic cul-de-sac.  It was necessary to proceed with endovaginal exam following the transabdominal exam to visualize the myometrium, endometrium and adnexa.  Comparison:  09/23/2008  Findings:  Uterus: Is anteverted and anteflexed and demonstrates a sagittal length is 7.3 cm, depth of 4.0 cm and width of 5.2 cm.  A mildly heterogeneous myometrium is seen with no focal abnormality identified  Endometrium: The patient's indwelling IUD is in place and appears appropriately  positioned with 2-D imaging.  The endometrial lining is obscured by the presence of the IUD.  Right ovary:  The right ovary measures 4.4 x 2.9 x 3.8 cm and contains a mildly complex cystic  lesion measuring 3.6 x 2.3 x 2.4 cm and containing at least one possibly two thin septations.  No flow is identified associated with this cyst with color Doppler exam and it appears to be partially collapsing suggesting it may be functional in nature.  Left ovary: Is not seen with confidence either transabdominally or endovaginally  Other findings: No pelvic fluid is seen.  IMPRESSION: The patient reported that the right ovary had been surgically removed but based on prior ultrasound which demonstrated a complex left ovarian mass and lack of visualization of the left ovary today, prior left oophorectomy is suspected  Slightly complex right ovarian cyst.  Given the imaging features, this is likely benign and functional in nature but short-term reevaluation is recommended in 6-8 weeks for reassessment. If functional, this would likely show resolution over that period.  Appropriate IUD positioning suggested with 2-D imaging.   Original Report Authenticated By: Ponciano Ort, M.D.    Dg Chest Port 1 View  09/24/2012  *RADIOLOGY REPORT*  Clinical Data: Cough, hyperglycemia  PORTABLE CHEST - 1 VIEW  Comparison: CT chest dated 09/20/2012  Findings: Low lung volumes.  Lungs are essentially clear.  No focal consolidation.  No pleural effusion or pneumothorax.  The heart is normal in size.  IMPRESSION: No evidence of acute cardiopulmonary disease.   Original Report Authenticated By: Julian Hy, M.D.    Impression:  Principal Problem:   DM (diabetes mellitus), type 1, uncontrolled Active Problems:   Hypothyroidism   Tobacco abuse   Morbid obesity   HTN (hypertension)   Bipolar disorder   URI (upper respiratory infection)   Hyperkalemia   Chronic kidney disease, stage III (moderate)     Plan: Pt was approached  with information on the need to optimal use of insulin pump to avoid acute and chronic complications of diabetes. A minimum of 4 tests a  Day required for pump use. Her clinical picture is moe indicative of type 2 diabetes as opposed to type 1, although she says she was worked up in the past and confirmed type 1 DM. For now, she will need to continue on intensive inuslin therapy regardless of whether she has type 1 or type 2 DM. I will attempt to convert her insulin therapy to basal/bolus insulin from the drip. I will initiate Lantus 24 units qhs, Novolog 10 units TIDAC plus correction dose Novolog. She will need glucose monitoring AC and HS. Pt will be reevaluated for insulin adjustment in the AM. Her pump is out of battery and could not retrieve settings. I advised that it kept in safe lock until tomorrow when we will resume based on her glycemic response. She will follow up with Dr. [name] 1 week after discharge for insulin dose readjustment.  Jamisha Hoeschen 09/26/2012, 8:45 AM

## 2012-09-27 DIAGNOSIS — N182 Chronic kidney disease, stage 2 (mild): Secondary | ICD-10-CM

## 2012-09-27 LAB — GLUCOSE, CAPILLARY
Glucose-Capillary: 208 mg/dL — ABNORMAL HIGH (ref 70–99)
Glucose-Capillary: 152 mg/dL — ABNORMAL HIGH (ref 70–99)
Glucose-Capillary: 201 mg/dL — ABNORMAL HIGH (ref 70–99)

## 2012-09-27 LAB — BASIC METABOLIC PANEL
CO2: 25 mEq/L (ref 19–32)
Calcium: 8.6 mg/dL (ref 8.4–10.5)
Chloride: 102 mEq/L (ref 96–112)
Creatinine, Ser: 1.35 mg/dL — ABNORMAL HIGH (ref 0.50–1.10)
GFR calc Af Amer: 56 mL/min — ABNORMAL LOW (ref >90)
Sodium: 136 mEq/L (ref 135–145)

## 2012-09-27 MED ORDER — INSULIN PUMP
SUBCUTANEOUS | Status: DC
Start: 2012-09-27 — End: 2012-12-25

## 2012-09-27 MED ORDER — INSULIN GLARGINE 100 UNIT/ML ~~LOC~~ SOLN: 34.0000 [IU] | Freq: Every day | SUBCUTANEOUS | Status: DC

## 2012-09-27 MED ORDER — INSULIN ASPART 100 UNIT/ML ~~LOC~~ SOLN: 15.0000 [IU] | Freq: Three times a day (TID) | SUBCUTANEOUS | Status: DC

## 2012-09-27 MED ORDER — INSULIN PUMP
Freq: Three times a day (TID) | SUBCUTANEOUS | Status: DC
  Filled 2012-09-27: qty 1

## 2012-09-27 MED ORDER — LISINOPRIL 20 MG PO TABS: 10.0000 mg | ORAL_TABLET | Freq: Every day | ORAL | Status: DC

## 2012-09-27 NOTE — Progress Notes (Signed)
Utilization Review Complete  

## 2012-09-27 NOTE — Progress Notes (Signed)
Subjective: F/U for chronically uncontrolled diabetes , did well on basal /bolus insulin off of the drip.. Objective: Vital signs in last 24 hours: Filed Vitals:   09/27/12 0251 09/27/12 0406 09/27/12 0500 09/27/12 0705  BP: 102/50   137/72  Pulse: 81   74  Temp: 97.9 F (36.6 C)   97.3 F (36.3 C)  TempSrc: Oral   Oral  Resp: 20   20  Height:      Weight:  135.535 kg (298 lb 12.8 oz) 135.535 kg (298 lb 12.8 oz)   SpO2: 94%   96%    Intake/Output Summary (Last 24 hours) at 09/27/12 0818 Last data filed at 09/26/12 1700  Gross per 24 hour  Intake 263.16 ml  Output      0 ml  Net 263.16 ml   Weight change: -0.907 kg (-2 lb)   Gen :well nourished, not in acute distress.  HEENT: moist mucus membranes.  Chest: CTAB, no wheezes.  CVS: No murmur, no gallop  Abd: obese, non tender, positive bowel sounds.  Ext: No edema.  Skin : no rash, no ulcer  Neuro: Non focal.  Lab Results: Basic Metabolic Panel:  Recent Labs  09/26/12 0435 09/27/12 0525  NA 135 136  K 4.2 3.7  CL 101 102  CO2 23 25  GLUCOSE 257* 183*  BUN 14 16  CREATININE 1.30* 1.35*  CALCIUM 8.5 8.6   Liver Function Tests: No results found for this basename: AST, ALT, ALKPHOS, BILITOT, PROT, ALBUMIN,  in the last 72 hours No results found for this basename: LIPASE, AMYLASE,  in the last 72 hours CBC: No results found for this basename: WBC, NEUTROABS, HGB, HCT, MCV, PLT,  in the last 72 hours Cardiac Enzymes: No results found for this basename: CKTOTAL, CKMB, CKMBINDEX, TROPONINI,  in the last 72 hours BNP: No components found with this basename: POCBNP,  D-Dimer: No results found for this basename: DDIMER,  in the last 72 hours CBG:  Recent Labs  09/26/12 1435 09/26/12 1647 09/26/12 2114 09/27/12 0033 09/27/12 0402 09/27/12 0723  GLUCAP 168* 225* 208* 252* 231* 179*   Hemoglobin A1C: 9.8%. Thyroid Function Tests:  Recent Labs  09/24/12 0940  TSH 0.145*   Anemia Panel: No results  found for this basename: VITAMINB12, FOLATE, FERRITIN, TIBC, IRON, RETICCTPCT,  in the last 72 hours  Studies/Results: No results found.   Medications:  Scheduled Meds: . aspirin EC  81 mg Oral Daily  . carvedilol  25 mg Oral BID WC  . clonazePAM  1 mg Oral QHS  . insulin aspart  0-6 Units Subcutaneous Q4H  . insulin aspart  15 Units Subcutaneous TID WC  . insulin glargine  34 Units Subcutaneous QHS  . lamoTRIgine  100 mg Oral Daily  . levothyroxine  100 mcg Oral QAC breakfast  . simvastatin  40 mg Oral q1800  . sodium chloride  3 mL Intravenous Q12H   Continuous Infusions:  PRN Meds:.acetaminophen, albuterol, dextrose, ondansetron (ZOFRAN) IV, sorbitol, traZODone  Assessment/Plan: Principal Problem:   DM (diabetes mellitus), type 1, uncontrolled Active Problems:   Hypothyroidism   Tobacco abuse   Morbid obesity   HTN (hypertension)   Bipolar disorder   URI (upper respiratory infection)   Hyperkalemia   Chronic kidney disease, stage III (moderate)   Plan; Pt was approached with information on the need to optimal use of insulin pump to avoid acute and chronic complications of diabetes. A minimum of 4 tests a Day required for pump  use.  Her clinical picture is moe indicative of type 2 diabetes as opposed to type 1, although she says she was worked up in the past and confirmed type 1 DM.  She did well on basal/bolus insulin over the last 24 hours, her insulin pump settings were reviewed, basal insulin  2.4-2.5 units /hour, and bolus 1 units for 4 grams of CHO. This is tight enough and  Indicative of insulin resistance which is more a feature of type 2 DM. She may need reevaluation of biomarkers when her a1c approaches target of 7%.  Sh eis advised to resume her insulin pump with same settings. She will need glucose monitoring AC and HS.  Pt will be reevaluated for insulin adjustment in the AM if she stays.  She will follow up with Dr. [name] 1 week after discharge for insulin  dose readjustment.      LOS: 3 days   NIDA,GEBRESELASSIE 09/27/2012, 8:18 AM

## 2012-09-28 NOTE — Discharge Summary (Signed)
Physician Discharge Summary  Jasmine [name] [id number] DOB: 06/20/1970 DOA: 09/24/2012  PCP: Tula Nakayama  Admit date: 09/24/2012 Discharge date: 09/28/2012  Time spent: Greater than 30 minutes  Recommendations for Outpatient Follow-up:  1. The dose of lisinopril was decreased form 20 mg twice daily to 10 mg daily due to hyperkalemia. 2. The patient will need to have  Her serum potassium rechecked. 3. Her TSH,  free T4 and free T3 will need to repeated in 4-6 weeks.  Discharge Diagnoses:  Principal Problem:   DM (diabetes mellitus), type 1, uncontrolled Active Problems:   Hypothyroidism   Tobacco abuse   Morbid obesity   HTN (hypertension)   Bipolar disorder   URI (upper respiratory infection)   Hyperkalemia   Chronic kidney disease, stage III (moderate)   Discharge Condition: Improved.  Diet recommendation: Carbohydrate modified/heart healthy.  Filed Weights   09/26/12 0500 09/27/12 0406 09/27/12 0500  Weight: 136.442 kg (300 lb 12.8 oz) 135.535 kg (298 lb 12.8 oz) 135.535 kg (298 lb 12.8 oz)    History of present illness:   Jasmine [name] is an 42 y.o. female. Is morbidly obese Caucasian lady with diabetes type 1, managed with an insulin pump, and other comorbidities as listed below including chronic kidney and hypertension. She came in with a complaint of persistently higher blood sugars at home. She has had no fever or chills, but complains of a  postnasal drip. Her blood sugars have been rising steadily despite increasing doses of insulin, so she decided to come to the emergency room before matters got worse. She recently upgraded her insulin pump. She does not believe it is malfunctioning. In the emergency room, her initial CBG was greater than 600. There is no evidence of ketosis; her ABG is normal.  She had been been having an upper respiratory infection for one week for which she has completed a course of Z-Pak, but hoarseness remains.      Hospital  Course:   1. Diabetes mellitus type 1 with uncontrolled hyperglycemia: Her insulin pump was turned off. She was started on the insulin infusion via Glucomander protocol. IV fluids were started and given aggressively over the first 24 hours. Eventually, her capillary blood glucose improved. The insulin drip was discontinued in favor of NPH and Lantus and sliding-scale NovoLog. Endocrinologist, Dr. Dorris Fetch was consulted. He made several recommendations which were followed. Also, the diabetes coordinator provided suggestions. Eventually, her insulin pump was restarted and all of the other insulin was discontinued. Over the 24 hours leading up to discharge, her capillary blood glucose remained relatively stable, ranging from 150-200. Rather than continuing to micromanage her insulin pump settings during the hospitalization, she was instructed to followup with her endocrinologist Dr. Iran Planas in [facility]. Her hemoglobin A1c was noted to be 9.8, indicating relatively poor outpatient control. There is no evidence of DKA. It was possible that her recent upper respiratory infection precipitated uncontrolled diabetes. Continue insulin infusion, endocrinology consultation pending, NPH, Lantus, meal coverage and sliding scale started last night per endocrinology. 2. Hyperkalemia: The patient's serum potassium was 5.8 on admission. This is likely a combination of underlying chronic kidney disease, dehydration, ACE inhibitor therapy, and uncontrolled hyperglycemia. She was treated with 2 doses of Kayexalate. Lisinopril was discontinued. Following the measures taken, her serum potassium normalized. She was instructed to restart lisinopril only at 10 mg daily rather than 40 mg daily to decrease the risk of recurrent hyperkalemia.  3. Recent upper respiratory tract infection: Treated with a  Z-Pak prior to this admission. Chest x-ray was negative on admission. There was no hypoxia or tachypnea. 4. Hypertension: Lisinopril  and hydrochlorothiazide were discontinued during hospitalization because of hyperkalemia and dehydration. Coreg was continued. Her blood pressure remained relatively controlled. Upon discharge, hydrochlorothiazide was restarted and lisinopril was restarted at only 10 mg daily for reasons as stated above. 5. Chronic kidney disease stage III: Also acute renal failure. Her creatinine was 1.60 on admission and following hydration and correction of her hyperglycemia, her creatinine was 1.35. 6. Bipolar disorder: Stable. 7. Hypothyroidism, Low TSH: Total T3 and free T4 were within normal limits. 8. Obstructive sleep apnea: Noncompliant with CPAP. Stable. 9. Anxiety, depression, bipolar disorder:  Stable.  Lamictal and Klonopin were continued. 10. Cigarette smoker: Recommended cessation. 11. Obesity 12. Interstitial lung disease:She was instructed to followup with pulmonology as an outpatient. Differential diagnosis sarcoidosis or hypersensitivity pneumonitis. Repeat CT prior to admission did demonstrate improvement.    Procedures:  None  Consultations:  Endocrinologist, Dr. Dorris Fetch  Discharge Exam: Filed Vitals:   09/27/12 0500 09/27/12 0705 09/27/12 1003 09/27/12 1351  BP:  137/72 138/81 128/77  Pulse:  74 79 74  Temp:  97.3 F (36.3 C) 98 F (36.7 C) 97.9 F (36.6 C)  TempSrc:  Oral    Resp:  20 20 20   Height:      Weight: 135.535 kg (298 lb 12.8 oz)     SpO2:  96% 89% 93%    General: Pleasant alert obese 42 year old Caucasian woman sitting up in bed, in no acute distress. Cardiovascular: S1, S2, with no murmurs rubs or gallops. Respiratory: Clear to auscultation bilaterally.  Discharge Instructions  Discharge Orders   Future Appointments Provider Department Dept Phone   10/02/2012 3:45 PM Brand Males, MD [facility] [phone]   Future Orders Complete By Expires     Diet - low sodium heart healthy  As directed     Diet Carb Modified  As directed      Discharge instructions  As directed     Comments:      The dose of lisinopril was reduced to 10 mg (half of a 20 mg tablet) once daily because of your elevated serum potassium on admission. You'll need to have a followup blood test to recheck your potassium at her followup appointment.    Increase activity slowly  As directed         Medication List    STOP taking these medications       insulin glargine 100 UNIT/ML injection  Commonly known as:  LANTUS      TAKE these medications       acetaminophen 500 MG tablet  Commonly known as:  TYLENOL  Take 1,000 mg by mouth every 6 (six) hours as needed. For pain     albuterol 108 (90 BASE) MCG/ACT inhaler  Commonly known as:  PROVENTIL HFA;VENTOLIN HFA  Inhale 2 puffs into the lungs every 6 (six) hours as needed for wheezing.     aspirin EC 81 MG tablet  Take 81 mg by mouth daily.     budesonide-formoterol 160-4.5 MCG/ACT inhaler  Commonly known as:  SYMBICORT  Inhale 2 puffs into the lungs 2 (two) times daily.     carvedilol 25 MG tablet  Commonly known as:  COREG  Take 25 mg by mouth 2 (two) times daily with a meal.     cholecalciferol 1000 UNITS tablet  Commonly known as:  VITAMIN D  Take 1,000 Units by  mouth daily.     clonazePAM 1 MG tablet  Commonly known as:  KLONOPIN  Take 1 mg by mouth at bedtime.     fish oil-omega-3 fatty acids 1000 MG capsule  Take 1 g by mouth daily.     fluticasone 50 MCG/ACT nasal spray  Commonly known as:  FLONASE  Place 2 sprays into the nose daily as needed for allergies.     folic acid [id number] MCG tablet  Commonly known as:  FOLVITE  Take 800 mcg by mouth daily.     hydrochlorothiazide 25 MG tablet  Commonly known as:  HYDRODIURIL  Take 25 mg by mouth daily.     insulin aspart 100 UNIT/ML injection  Commonly known as:  novoLOG  Inject into the skin continuous. Insulin Pump     insulin pump 100 unit/ml Soln  Follow previous pump settings.     lamoTRIgine 100 MG tablet   Commonly known as:  LAMICTAL  Take 100-200 mg by mouth daily. Takes 100 mg in the morning and 200 mg in the evening.     levothyroxine 100 MCG tablet  Commonly known as:  SYNTHROID, LEVOTHROID  Take 100 mcg by mouth daily.     lisinopril 20 MG tablet  Commonly known as:  PRINIVIL,ZESTRIL  Take 0.5 tablets (10 mg total) by mouth daily.     mirtazapine 45 MG tablet  Commonly known as:  REMERON  Take 45 mg by mouth at bedtime.     pravastatin 80 MG tablet  Commonly known as:  PRAVACHOL  Take 80 mg by mouth at bedtime.     VITAMIN E PO  Take 1 tablet by mouth daily.           Follow-up Information   Follow up with Roni Bread, MD. (Followup as scheduled.)    Contact information:   [place] [id number] [place] [address] [phone]        The results of significant diagnostics from this hospitalization (including imaging, microbiology, ancillary and laboratory) are listed below for reference.    Significant Diagnostic Studies: Ct Chest Wo Contrast  09/20/2012  *RADIOLOGY REPORT*  Clinical Data: Evaluate pulmonary infiltrates.  Question sarcoid. Question pulmonary nodule.  CT CHEST WITHOUT CONTRAST  Technique:  Multidetector CT imaging of the chest was performed following the standard protocol without IV contrast.  Comparison: 05/01/2012  Findings: Lungs/pleura: No pleural effusion identified.  Previously noted upper lobe predominant interstitial accentuation appears improved from previous exam.  There are areas of scarring within the medial right upper lobes.  No pulmonary nodularity or airspace consolidation noted.  Heart/Mediastinum: The heart size is normal.  No pericardial effusion.  9 mm prevascular lymph node is identified, image 15/series 2.  This is unchanged from previous exam.  The subcarinal lymph node measures 1 cm, image 23/series 2.  Previously this measured 1.3 cm.  Right paratracheal lymph node measures 0.7 cm, image 14/series 2.  Previously this  measured 1 cm.  No significant hilar adenopathy.  Upper abdomen: Again noted is bilateral perirenal fat stranding. No acute findings noted within the upper abdomen.  Bones/Musculoskeletal:  Review of the visualized bony structures is unremarkable.  IMPRESSION:  1.  No acute findings identified within the abdomen or pelvis. 2.  Interval improvement and upper lobe predominant interstitial accentuation. 3.  Decrease in size of mediastinal lymph nodes.   Original Report Authenticated By: Kerby Moors, M.D.    US Transvaginal Non-ob  09/08/2012  *RADIOLOGY REPORT*  Clinical Data:  Vaginal discharge and lower abdominal cramps. Status post oophorectomy for endometriosis. Mirena IUD in place  TRANSABDOMINAL AND TRANSVAGINAL ULTRASOUND OF PELVIS Technique:  Both transabdominal and transvaginal ultrasound examinations of the pelvis were performed. Transabdominal technique was performed for global imaging of the pelvis including uterus, ovaries, adnexal regions, and pelvic cul-de-sac.  It was necessary to proceed with endovaginal exam following the transabdominal exam to visualize the myometrium, endometrium and adnexa.  Comparison:  09/23/2008  Findings:  Uterus: Is anteverted and anteflexed and demonstrates a sagittal length is 7.3 cm, depth of 4.0 cm and width of 5.2 cm.  A mildly heterogeneous myometrium is seen with no focal abnormality identified  Endometrium: The patient's indwelling IUD is in place and appears appropriately positioned with 2-D imaging.  The endometrial lining is obscured by the presence of the IUD.  Right ovary:  The right ovary measures 4.4 x 2.9 x 3.8 cm and contains a mildly complex cystic lesion measuring 3.6 x 2.3 x 2.4 cm and containing at least one possibly two thin septations.  No flow is identified associated with this cyst with color Doppler exam and it appears to be partially collapsing suggesting it may be functional in nature.  Left ovary: Is not seen with confidence either  transabdominally or endovaginally  Other findings: No pelvic fluid is seen.  IMPRESSION: The patient reported that the right ovary had been surgically removed but based on prior ultrasound which demonstrated a complex left ovarian mass and lack of visualization of the left ovary today, prior left oophorectomy is suspected  Slightly complex right ovarian cyst.  Given the imaging features, this is likely benign and functional in nature but short-term reevaluation is recommended in 6-8 weeks for reassessment. If functional, this would likely show resolution over that period.  Appropriate IUD positioning suggested with 2-D imaging.   Original Report Authenticated By: Ponciano Ort, M.D.    US Pelvis Complete  09/01/2012  *RADIOLOGY REPORT*  Clinical Data: Vaginal discharge and lower abdominal cramps. Status post oophorectomy for endometriosis. Mirena IUD in place  TRANSABDOMINAL AND TRANSVAGINAL ULTRASOUND OF PELVIS Technique:  Both transabdominal and transvaginal ultrasound examinations of the pelvis were performed. Transabdominal technique was performed for global imaging of the pelvis including uterus, ovaries, adnexal regions, and pelvic cul-de-sac.  It was necessary to proceed with endovaginal exam following the transabdominal exam to visualize the myometrium, endometrium and adnexa.  Comparison:  09/23/2008  Findings:  Uterus: Is anteverted and anteflexed and demonstrates a sagittal length is 7.3 cm, depth of 4.0 cm and width of 5.2 cm.  A mildly heterogeneous myometrium is seen with no focal abnormality identified  Endometrium: The patient's indwelling IUD is in place and appears appropriately positioned with 2-D imaging.  The endometrial lining is obscured by the presence of the IUD.  Right ovary:  The right ovary measures 4.4 x 2.9 x 3.8 cm and contains a mildly complex cystic lesion measuring 3.6 x 2.3 x 2.4 cm and containing at least one possibly two thin septations.  No flow is identified associated  with this cyst with color Doppler exam and it appears to be partially collapsing suggesting it may be functional in nature.  Left ovary: Is not seen with confidence either transabdominally or endovaginally  Other findings: No pelvic fluid is seen.  IMPRESSION: The patient reported that the right ovary had been surgically removed but based on prior ultrasound which demonstrated a complex left ovarian mass and lack of visualization of the left ovary today, prior left oophorectomy  is suspected  Slightly complex right ovarian cyst.  Given the imaging features, this is likely benign and functional in nature but short-term reevaluation is recommended in 6-8 weeks for reassessment. If functional, this would likely show resolution over that period.  Appropriate IUD positioning suggested with 2-D imaging.   Original Report Authenticated By: Ponciano Ort, M.D.    Dg Chest Port 1 View  09/24/2012  *RADIOLOGY REPORT*  Clinical Data: Cough, hyperglycemia  PORTABLE CHEST - 1 VIEW  Comparison: CT chest dated 09/20/2012  Findings: Low lung volumes.  Lungs are essentially clear.  No focal consolidation.  No pleural effusion or pneumothorax.  The heart is normal in size.  IMPRESSION: No evidence of acute cardiopulmonary disease.   Original Report Authenticated By: Julian Hy, M.D.     Microbiology: No results found for this or any previous visit (from the past 240 hour(s)).   Labs: Basic Metabolic Panel:  Recent Labs Lab 09/24/12 0506 09/25/12 0436 09/25/12 1541 09/26/12 0435 09/27/12 0525  NA 128* 130* 131* 135 136  K 5.0 5.8* 5.2* 4.2 3.7  CL 97 97 98 101 102  CO2 22 22 23 23 25   GLUCOSE 518* 495* 224* 257* 183*  BUN 20 21 18 14 16   CREATININE 1.55* 1.60* 1.54* 1.30* 1.35*  CALCIUM 8.4 8.3* 8.6 8.5 8.6  MG 1.4*  --   --   --   --    Liver Function Tests:  Recent Labs Lab 09/24/12 0506  AST 14  ALT 11  ALKPHOS 90  BILITOT 0.3  PROT 5.7*  ALBUMIN 2.7*   No results found for this  basename: LIPASE, AMYLASE,  in the last 168 hours No results found for this basename: AMMONIA,  in the last 168 hours CBC:  Recent Labs Lab 09/24/12 0506  WBC 9.1  NEUTROABS 5.9  HGB 12.0  HCT 36.0  MCV 92.1  PLT 224   Cardiac Enzymes: No results found for this basename: CKTOTAL, CKMB, CKMBINDEX, TROPONINI,  in the last 168 hours BNP: BNP (last 3 results) No results found for this basename: PROBNP,  in the last 8760 hours CBG:  Recent Labs Lab 09/27/12 0033 09/27/12 0402 09/27/12 0723 09/27/12 1035 09/27/12 1124  GLUCAP 252* 231* 179* 201* 152*       Signed:  Britten Parady  [facility] 09/28/2012, 7:12 AM

## 2012-10-02 ENCOUNTER — Encounter: Payer: Self-pay | Admitting: Internal Medicine

## 2012-10-02 ENCOUNTER — Ambulatory Visit (INDEPENDENT_AMBULATORY_CARE_PROVIDER_SITE_OTHER): Payer: Medicare Other | Admitting: Internal Medicine

## 2012-10-02 VITALS — BP 130/90 | HR 72 | Temp 98.3°F | Ht 69.5 in | Wt 300.0 lb

## 2012-10-02 DIAGNOSIS — R918 Other nonspecific abnormal finding of lung field: Secondary | ICD-10-CM | POA: Insufficient documentation

## 2012-10-02 DIAGNOSIS — R0982 Postnasal drip: Secondary | ICD-10-CM | POA: Insufficient documentation

## 2012-10-02 DIAGNOSIS — Z720 Tobacco use: Secondary | ICD-10-CM

## 2012-10-02 DIAGNOSIS — F17200 Nicotine dependence, unspecified, uncomplicated: Secondary | ICD-10-CM

## 2012-10-02 NOTE — Progress Notes (Signed)
Subjective:    Patient ID: Jasmine [name], female    DOB: 12/11/1970, 42 y.o.   MRN: [id number]  HPI PCP is Tula Nakayama   IOV 08/14/2012  42 year old female.  Smoker. Body mass index is 43.59 kg/(m^2). Type 1 Diabetic on insulin pump. OSA on CPAP [apnea hypotony index of 19.9 on sleep study 04/27/2012. BiPAP titration and 18/14 showed most benefit of his history of noncompliance]. Bipolar (normally takes father for medical visits to help understand what is going on)   Reports that in oct 2013: went to [facility] due to knee pain. PMD heard her wheeze. CXR showed abnormality. Referred to Dr Feliz Beam (pulmonary at [facility]) who initially said asthmatic bronchitis. Had PFTs which patient recollects as normal. This was followed by CT chest Oct ([facility]) and Nov 2013 ([place] [facility]). Per review of outside records a bronchoscopy and transbronchial biopsies was considered as the first step but due to concern of potential lymphoma in the differential diagnosis she was referred to thoracic surgery at [facility]. Referred to [facility] to Dr Hilda Blades. In Jan 2014: was due to have what sounds like VATS robotic lung biopsy by Dr Lianne Moris but due to high sugars kept in ICU and procedure canceled. . Of note only test she recolets is ESR 41 by PMD.  Review of outside records obtained in my hands after patient left the office shows differential diagnosis of sarcoidosis versus hypersensitivity pneumonitis. Lymphoma was entertained by the patient.   Patient currently self admittedly overwhelmed by above and difficulty concentrating and grasping even at baseline due to health issues. She is now seeing second opinion. She is upset with Dr Feliz Beam esp after he labeled as her type 2 DM.  Also, she is very SCARED about having open biopsy due to risks and what she perceives as poor explanation.   Currently  Cough x 1 week but denies chronic wheeze or dyspnea or chest pains or cough or rash. Only systemic  issue is wrist pain OA due to typing. She is on symbicort which she does not see as heplping   Of note, I only noted that after she left the office and gave blood work at our lab that her ANA, and, CBC, ESR, fungal antibodies, IgE, hypersensitivity pneumonitis panel, serum ACE level were actually normal on 04/07/2012 at [facility]  Pulmonary function testing dated 05/01/2012 FEV1 2.8 L/82%. FVC 2.3 L/79%. Total lung capacity 4.75 L 82%. DLCO of 18.7 per/55%  Exposure   reports that she has been smoking Cigarettes.  She has a 20 pack-year smoking history. She does not have any smokeless tobacco history on file.   CT chest 03/28/12  CT CHEST WITHOUT CONTRAST  Technique: Multidetector CT imaging of the chest was performed  following the standard protocol without IV contrast.  Comparison: Plain films of 11/03/2011 and 06/17/2011. No prior  CTs.  Findings: Lung windows demonstrate peribronchovascular nodularity  which is slightly greater on the right. The well-defined nodule is  in the peribronchovascular left upper lobe at 9 mm on image 14. No  definite cranial caudal gradient. 18. Thickening of the right  major fissure secondary to nodularity on image 23.  Soft tissue windows demonstrate calcified right-sided thyroid  nodule which measures 6 mm and is nonspecific. No axillary  adenopathy. Heart size upper normal, without pericardial or  pleural effusion.  Right paratracheal 1.2 cm node on image 16.  Subcarinal/azygo-esophageal recess 1.9 cm node on image 25. Hilar  regions are poorly evaluated without contrast. There is however,  bilateral soft tissue fullness, most consistent with adenopathy.  Prevascular node measures 1.4 cm on image 17.  Limited abdominal imaging demonstrates no significant findings. No  acute osseous abnormality. Deformity of the sternal body is likely  related to remote trauma.  IMPRESSION:  Thoracic adenopathy with concurrent  diffuse peribronchovascular  nodularity. Favor sarcoidosis. Other granulomatous processes,  such as atypical/mycobacterial infection are felt less likely.  Lymphoproliferative process could cause the thoracic adenopathy,  but would not typically cause the pulmonary parenchymal findings.  CT followup could be performed at 3 months to confirm stability of  the adenopathy.  Original Report Authenticated By: Areta Haber, M.D   REC Differential diagnosis appears to be sarcoidosis or hypersensitivity pneumonitis. Lymphomas lower the differential diagnosis. I ordered autoimmune lab tests and ACE level based on this patient's history that these blood tests were never done. However outside records after we did blood draw indicates that these tests were done and they were negative. Assuming that it will be negative even at our lab, I will have our local radiologist compare her 2 scans. After which I will advise patient the likely next step which will involve bronchoscopy with lavage/transbronchial biopsies with endobronchial ultrasound biopsy    OV 10/02/2012 Followup for pulmonary infiltrates, smoking and postnasal drip  - Do of the pulmonary infiltrate: She underwent autoimmune test, ACE level test, hypersensitivity pneumonitis panel. All this is negative. She at followup CT scan of the chest 09/20/2012: Does show significant clearing of infiltrates and improvement in her mediastinal nodes.  - In terms of smoking: She was admitted last week for her diabetes and this prompted her to quit smoking. Currently smoking is in remission  - Current active problem: Is mild postnasal drainage and related cough which she feels is fairly under control except last week it flared up and she took Zyrtec   Past, Family, Social reviewed: no change since last visit   Review of Systems  Constitutional: Negative for fever and unexpected weight change.  HENT: Positive for postnasal drip. Negative for ear pain,  nosebleeds, congestion, sore throat, rhinorrhea, sneezing, trouble swallowing, dental problem and sinus pressure.   Eyes: Negative for redness and itching.  Respiratory: Positive for cough. Negative for chest tightness, shortness of breath and wheezing.   Cardiovascular: Negative for palpitations and leg swelling.  Gastrointestinal: Negative for nausea and vomiting.  Genitourinary: Negative for dysuria.  Musculoskeletal: Negative for joint swelling.  Skin: Negative for rash.  Neurological: Negative for headaches.  Hematological: Does not bruise/bleed easily.  Psychiatric/Behavioral: Negative for dysphoric mood. The patient is not nervous/anxious.        Objective:   Physical Exam Vitals reviewed. Constitutional: She is oriented to person, place, and time. She appears well-developed and well-nourished. No distress.  HENT:  Head: Normocephalic and atraumatic.  Right Ear: External ear normal.  Left Ear: External ear normal.  Mouth/Throat: Oropharynx is clear and moist. No oropharyngeal exudate.  Eyes: Conjunctivae and EOM are normal. Pupils are equal, round, and reactive to light. Right eye exhibits no discharge. Left eye exhibits no discharge. No scleral icterus.  Neck: Normal range of motion. Neck supple. No JVD present. No tracheal deviation present. No thyromegaly present.  Cardiovascular: Normal rate, regular rhythm, normal heart sounds and intact distal pulses.  Exam reveals no gallop and no friction rub.   No murmur heard. Pulmonary/Chest: Effort normal. No respiratory distress. She has no wheezes.. She exhibits no tenderness.  Abdominal:  Soft. Bowel sounds are normal. She exhibits no distension and no mass. There is no tenderness. There is no rebound and no guarding.  Musculoskeletal: Normal range of motion. She exhibits no edema and no tenderness.  Lymphadenopathy:    She has no cervical adenopathy.  Neurological: She is alert and oriented to person, place, and time. She has  normal reflexes. No cranial nerve deficit. She exhibits normal muscle tone. Coordination normal.  Skin: Skin is warm and dry. No rash noted. She is not diaphoretic. No erythema. No pallor.  Psychiatric: She has a normal mood and affect. Her behavior is normal. Judgment and thought content normal.            Assessment & Plan:

## 2012-10-02 NOTE — Assessment & Plan Note (Signed)
Continue nasal steroid and Zyrtec as needed

## 2012-10-02 NOTE — Patient Instructions (Addendum)
#  Pulmonary infiltrates on CT scan October 2013 - This is almost cleared up on CT scan of the chest April 2014 - The followup CT scan of the chest without contrast 9-12 months from now  #Postnasal drip and related cough - Continue nasal steroid and Zyrtec as needed as before  #Smoking - Congratulations on quitting smoking  #Followup - 9-12 months of the CT scan of the chest - Please have flu shot in the fall - Come sooner if you're having any respiratory issues

## 2012-10-02 NOTE — Assessment & Plan Note (Signed)
Congratulated on quitting smoking April 2014 following admission for diabetes issues

## 2012-10-02 NOTE — Assessment & Plan Note (Signed)
Pulmonary infiltrates in the upper lobe lung with mediastinal nodes that was suggestive of sarcoid in October and November 2013 had largely improved/nearly resolved as of April 2014. Therefore in retrospect she probably had an acute infectious episode. To be on the safe side we'll follow with CT scans of chest in 9-12 months

## 2012-12-24 ENCOUNTER — Inpatient Hospital Stay (HOSPITAL_COMMUNITY)
Admission: EM | Admit: 2012-12-24 | Discharge: 2012-12-26 | DRG: 920 | Disposition: A | Discharge status: Home / Self Care | Payer: Medicare Other | Attending: Internal Medicine | Admitting: Internal Medicine

## 2012-12-24 ENCOUNTER — Encounter (HOSPITAL_COMMUNITY): Payer: Self-pay | Admitting: Emergency Medicine

## 2012-12-24 DIAGNOSIS — N182 Chronic kidney disease, stage 2 (mild): Secondary | ICD-10-CM

## 2012-12-24 DIAGNOSIS — R0982 Postnasal drip: Secondary | ICD-10-CM

## 2012-12-24 DIAGNOSIS — T85694A Other mechanical complication of insulin pump, initial encounter: Principal | ICD-10-CM | POA: Diagnosis present

## 2012-12-24 DIAGNOSIS — Z794 Long term (current) use of insulin: Secondary | ICD-10-CM

## 2012-12-24 DIAGNOSIS — N179 Acute kidney failure, unspecified: Secondary | ICD-10-CM

## 2012-12-24 DIAGNOSIS — J069 Acute upper respiratory infection, unspecified: Secondary | ICD-10-CM

## 2012-12-24 DIAGNOSIS — N184 Chronic kidney disease, stage 4 (severe): Secondary | ICD-10-CM | POA: Diagnosis present

## 2012-12-24 DIAGNOSIS — Z87891 Personal history of nicotine dependence: Secondary | ICD-10-CM

## 2012-12-24 DIAGNOSIS — E871 Hypo-osmolality and hyponatremia: Secondary | ICD-10-CM

## 2012-12-24 DIAGNOSIS — K529 Noninfective gastroenteritis and colitis, unspecified: Secondary | ICD-10-CM

## 2012-12-24 DIAGNOSIS — E7800 Pure hypercholesterolemia, unspecified: Secondary | ICD-10-CM | POA: Diagnosis present

## 2012-12-24 DIAGNOSIS — E875 Hyperkalemia: Secondary | ICD-10-CM

## 2012-12-24 DIAGNOSIS — K219 Gastro-esophageal reflux disease without esophagitis: Secondary | ICD-10-CM | POA: Diagnosis present

## 2012-12-24 DIAGNOSIS — Z720 Tobacco use: Secondary | ICD-10-CM

## 2012-12-24 DIAGNOSIS — R748 Abnormal levels of other serum enzymes: Secondary | ICD-10-CM

## 2012-12-24 DIAGNOSIS — I10 Essential (primary) hypertension: Secondary | ICD-10-CM

## 2012-12-24 DIAGNOSIS — G4730 Sleep apnea, unspecified: Secondary | ICD-10-CM | POA: Diagnosis present

## 2012-12-24 DIAGNOSIS — R739 Hyperglycemia, unspecified: Secondary | ICD-10-CM

## 2012-12-24 DIAGNOSIS — E860 Dehydration: Secondary | ICD-10-CM

## 2012-12-24 DIAGNOSIS — I129 Hypertensive chronic kidney disease with stage 1 through stage 4 chronic kidney disease, or unspecified chronic kidney disease: Secondary | ICD-10-CM | POA: Diagnosis present

## 2012-12-24 DIAGNOSIS — IMO0002 Reserved for concepts with insufficient information to code with codable children: Secondary | ICD-10-CM

## 2012-12-24 DIAGNOSIS — E039 Hypothyroidism, unspecified: Secondary | ICD-10-CM

## 2012-12-24 DIAGNOSIS — N393 Stress incontinence (female) (male): Secondary | ICD-10-CM

## 2012-12-24 DIAGNOSIS — Y838 Other surgical procedures as the cause of abnormal reaction of the patient, or of later complication, without mention of misadventure at the time of the procedure: Secondary | ICD-10-CM | POA: Diagnosis present

## 2012-12-24 DIAGNOSIS — N183 Chronic kidney disease, stage 3 unspecified: Secondary | ICD-10-CM

## 2012-12-24 DIAGNOSIS — R918 Other nonspecific abnormal finding of lung field: Secondary | ICD-10-CM

## 2012-12-24 DIAGNOSIS — F411 Generalized anxiety disorder: Secondary | ICD-10-CM | POA: Diagnosis present

## 2012-12-24 DIAGNOSIS — F319 Bipolar disorder, unspecified: Secondary | ICD-10-CM

## 2012-12-24 DIAGNOSIS — Z9641 Presence of insulin pump (external) (internal): Secondary | ICD-10-CM

## 2012-12-24 DIAGNOSIS — Z6841 Body Mass Index (BMI) 40.0 and over, adult: Secondary | ICD-10-CM

## 2012-12-24 DIAGNOSIS — E1010 Type 1 diabetes mellitus with ketoacidosis without coma: Secondary | ICD-10-CM

## 2012-12-24 DIAGNOSIS — E1065 Type 1 diabetes mellitus with hyperglycemia: Secondary | ICD-10-CM | POA: Diagnosis present

## 2012-12-24 LAB — POCT PREGNANCY, URINE: Preg Test, Ur: NEGATIVE

## 2012-12-24 MED ORDER — SODIUM CHLORIDE 0.9 % IV BOLUS (SEPSIS)
1000.0000 mL | Freq: Once | INTRAVENOUS | Status: AC
  Administered 2012-12-24: 1000 mL via INTRAVENOUS

## 2012-12-24 NOTE — ED Notes (Signed)
Patient states she has had high blood sugars all day; states has been "chasing it with insulin" and it will come down and then come back up.

## 2012-12-24 NOTE — ED Provider Notes (Signed)
History  This chart was scribed for Jasmine Cable, MD by Elby Beck, ED Scribe. This patient was seen in room APA03/APA03 and the patient's care was started at 11:07 PM.  CSN: [id number]  Arrival date & time 12/24/12  2250   Chief Complaint  Patient presents with  . Hyperglycemia    Patient is a 42 y.o. female presenting with hyperglycemia. The history is provided by the patient. No language interpreter was used.  Hyperglycemia Blood sugar level PTA:  600 Severity:  Severe Duration:  2 days Timing:  Intermittent Progression:  Worsening Diabetes status:  Controlled with insulin Current diabetic therapy:  Insulin pump Context: insulin pump use (possible malfunction)   Context: not noncompliance   Associated symptoms: shortness of breath   Associated symptoms: no abdominal pain, no chest pain, no dysuria, no fever and no vomiting    HPI Comments: Jasmine [name] is a 42 y.o. female who presents to the Emergency Department complaining of hyperglycemia. She states that she has been fully compliant with her insulin pump which she has had for 3 years. She says that her blood sugar has fluctuated and been high the past 2 days. She reports associated diarrhea and SOB over the past 2 days. ED Glucose-Capillary blood sugar was over 600. She states that she tested positive for ketones in her urine at home. She has an IUD and her LNMP is unknown. She denies fever, vomiting, abdominal pain, chest pain, dysuria, cough.   Past Medical History  Diagnosis Date  . Hypertension   . Diabetes mellitus   . Bipolar disorder   . CKD (chronic kidney disease), stage II   . Elevated cholesterol   . GERD (gastroesophageal reflux disease)   . Stress incontinence   . Hypothyroidism   . Sleep apnea     HAS C -PAP / DOES NOT USE  . Anxiety   . Depression   . UTI (lower urinary tract infection)   . Tobacco abuse   . Obesity   . DKA, type 1 11/04/2011   Past Surgical History  Procedure  Laterality Date  . Ovary removed    . Nasal fracture surgery    . Uterine fibroid surgery  2001  . Pubovaginal sling  08/16/2011    Procedure: [name];  Surgeon: Bernestine Amass, MD;  Location: [facility];  Service: Urology;  Laterality: N/A;         . Incontinence surgery     Family History  Problem Relation Age of Onset  . Asthma Mother   . Heart disease Father   . Cancer Paternal Grandmother     lung and breast  . Bladder Cancer Paternal Grandfather    History  Substance Use Topics  . Smoking status: Current Every Day Smoker -- 0.50 packs/day for 20 years    Types: Cigarettes  . Smokeless tobacco: Not on file  . Alcohol Use: No   OB History   Grav Para Term Preterm Abortions TAB SAB Ect Mult Living                 Review of Systems  Constitutional: Negative for fever.  Respiratory: Positive for shortness of breath. Negative for cough.   Cardiovascular: Negative for chest pain.  Gastrointestinal: Negative for vomiting and abdominal pain.  Genitourinary: Negative for dysuria.  All other systems reviewed and are negative.      Allergies  Advair diskus and Biaxin  Home Medications   Current Outpatient Rx  Name  Route  Sig  Dispense  Refill  . acetaminophen (TYLENOL) 500 MG tablet   Oral   Take 1,000 mg by mouth every 6 (six) hours as needed. For pain         . albuterol (PROVENTIL HFA;VENTOLIN HFA) 108 (90 BASE) MCG/ACT inhaler   Inhalation   Inhale 2 puffs into the lungs every 6 (six) hours as needed for wheezing.         Marland Kitchen aspirin EC 81 MG tablet   Oral   Take 81 mg by mouth daily.         . carvedilol (COREG) 25 MG tablet   Oral   Take 37.5 mg by mouth 2 (two) times daily with a meal.          . cholecalciferol (VITAMIN D) 1000 UNITS tablet   Oral   Take 1,000 Units by mouth daily.         . clonazePAM (KLONOPIN) 1 MG tablet   Oral   Take 1 mg by mouth at bedtime.         . fish oil-omega-3 fatty acids 1000 MG capsule    Oral   Take 1 g by mouth daily.         . fluticasone (FLONASE) 50 MCG/ACT nasal spray   Nasal   Place 2 sprays into the nose daily as needed for allergies.         . folic acid (FOLVITE) [id number] MCG tablet   Oral   Take 800 mcg by mouth daily.         . hydrochlorothiazide (HYDRODIURIL) 25 MG tablet   Oral   Take 25 mg by mouth daily.         . insulin aspart (NOVOLOG) 100 UNIT/ML injection   Subcutaneous   Inject into the skin continuous. Insulin Pump         . Insulin Human (INSULIN PUMP) 100 unit/ml SOLN      Follow previous pump settings.         . lamoTRIgine (LAMICTAL) 100 MG tablet   Oral   Take 100-200 mg by mouth daily. Takes 100 mg in the morning and 200 mg in the evening.         Marland Kitchen levothyroxine (SYNTHROID, LEVOTHROID) 100 MCG tablet   Oral   Take 100 mcg by mouth daily.         Marland Kitchen lisinopril (PRINIVIL,ZESTRIL) 20 MG tablet   Oral   Take 0.5 tablets (10 mg total) by mouth daily.         . mirtazapine (REMERON) 45 MG tablet   Oral   Take 45 mg by mouth at bedtime.         . pravastatin (PRAVACHOL) 80 MG tablet   Oral   Take 80 mg by mouth at bedtime.         Marland Kitchen VITAMIN E PO   Oral   Take 1 tablet by mouth daily.          Triage Vitals: BP 152/74  Pulse 80  Temp(Src) 98.4 F (36.9 C) (Oral)  Resp 20  Ht 5\' 10"  (1.778 m)  Wt 300 lb (136.079 kg)  BMI 43.05 kg/m2  SpO2 94% BP 130/49  Pulse 85  Temp(Src) 98.4 F (36.9 C) (Oral)  Resp 13  Ht 5\' 10"  (1.778 m)  Wt 300 lb (136.079 kg)  BMI 43.05 kg/m2  SpO2 97%   Physical Exam CONSTITUTIONAL: Well developed/well nourished. Does not smell ketotic. HEAD: Normocephalic/atraumatic EYES: EOMI/PERRL ENMT:  Mucous membranes dry NECK: supple no meningeal signs SPINE:entire spine nontender CV: S1/S2 noted, no murmurs/rubs/gallops noted LUNGS: Lungs are clear to auscultation bilaterally, no apparent distress ABDOMEN: soft, nontender, no rebound or guarding. Insulin pump in  place. GU:no cva tenderness NEURO: Pt is awake/alert, moves all extremitiesx4 EXTREMITIES: pulses normal, full ROM SKIN: warm, color normal PSYCH: no abnormalities of mood noted ad ED Course  Procedures   CRITICAL CARE Performed by: Jasmine [name] Total critical care time: 35 Critical care time was exclusive of separately billable procedures and treating other patients. Critical care was necessary to treat or prevent imminent or life-threatening deterioration. Critical care was time spent personally by me on the following activities: development of treatment plan with patient and/or surrogate as well as nursing, discussions with consultants, evaluation of patient's response to treatment, examination of patient, obtaining history from patient or surrogate, ordering and performing treatments and interventions, ordering and review of laboratory studies, ordering and review of radiographic studies, pulse oximetry and re-evaluation of patient's condition.  DIAGNOSTIC STUDIES: Oxygen Saturation is 94% on RA, low by my interpretation.    COORDINATION OF CARE: 11:22 PM- Pt advised of plan for treatment and pt agrees.  Medications  sodium chloride 0.9 % bolus 1,000 mL (1,000 mLs Intravenous New Bag/Given 12/24/12 2335)    Labs Reviewed  GLUCOSE, CAPILLARY - Abnormal; Notable for the following:    Glucose-Capillary >600 (*)    All other components within normal limits  BASIC METABOLIC PANEL  URINALYSIS, ROUTINE W REFLEX MICROSCOPIC  CBC WITH DIFFERENTIAL  POCT PREGNANCY, URINE   1:12 AM Suspect insulin pump failure Pt severely dehydrated, hyperglycemic and also hyperkalemic with EKG changes IV fluids and insulin ordered Also ordered kayexalate Will admit I have asked her to stop her insulin pump for now  1:31 AM D/w dr [place] Pink with hospitalist Will admit to stepdown for hyperkalemia/dehydration/hyperglycemia Insulin drip has been ordered Pt has been stabilized in the ED  MDM   Nursing notes including past medical history and social history reviewed and considered in documentation xrays reviewed and considered Labs/vital reviewed and considered        Date: 12/24/2012 2340  Rate: 88  Rhythm: normal sinus  QRS Axis: normal  Intervals: normal  ST/T Wave abnormalities: nonspecific ST changes  Conduction Disutrbances:nonspecific intraventricular conduction delay  Narrative Interpretation:   Old EKG Reviewed: changes noted - QRS more widened on this EKG     I personally performed the services described in this documentation, which was scribed in my presence. The recorded information has been reviewed and is accurate.       Jasmine Cable, MD 12/25/12 [phone]

## 2012-12-25 ENCOUNTER — Encounter (HOSPITAL_COMMUNITY): Payer: Self-pay | Admitting: Internal Medicine

## 2012-12-25 ENCOUNTER — Emergency Department (HOSPITAL_COMMUNITY): Payer: Medicare Other

## 2012-12-25 DIAGNOSIS — N189 Chronic kidney disease, unspecified: Secondary | ICD-10-CM | POA: Diagnosis present

## 2012-12-25 DIAGNOSIS — E039 Hypothyroidism, unspecified: Secondary | ICD-10-CM

## 2012-12-25 DIAGNOSIS — R7309 Other abnormal glucose: Secondary | ICD-10-CM

## 2012-12-25 DIAGNOSIS — N184 Chronic kidney disease, stage 4 (severe): Secondary | ICD-10-CM | POA: Diagnosis present

## 2012-12-25 DIAGNOSIS — E875 Hyperkalemia: Secondary | ICD-10-CM

## 2012-12-25 DIAGNOSIS — R739 Hyperglycemia, unspecified: Secondary | ICD-10-CM | POA: Diagnosis present

## 2012-12-25 LAB — GLUCOSE, CAPILLARY
Glucose-Capillary: >600 mg/dL (ref 70–99)
Glucose-Capillary: 488 mg/dL — ABNORMAL HIGH (ref 70–99)
Glucose-Capillary: 193 mg/dL — ABNORMAL HIGH (ref 70–99)
Glucose-Capillary: 320 mg/dL — ABNORMAL HIGH (ref 70–99)

## 2012-12-25 LAB — URINE MICROSCOPIC-ADD ON

## 2012-12-25 LAB — BASIC METABOLIC PANEL
BUN: 57 mg/dL — ABNORMAL HIGH (ref 6–23)
BUN: 55 mg/dL — ABNORMAL HIGH (ref 6–23)
BUN: 53 mg/dL — ABNORMAL HIGH (ref 6–23)
CO2: 24 mEq/L (ref 19–32)
CO2: 26 mEq/L (ref 19–32)
Calcium: 8.8 mg/dL (ref 8.4–10.5)
Calcium: 9.2 mg/dL (ref 8.4–10.5)
Chloride: 87 mEq/L — ABNORMAL LOW (ref 96–112)
Chloride: 98 mEq/L (ref 96–112)
Creatinine, Ser: 2.27 mg/dL — ABNORMAL HIGH (ref 0.50–1.10)
Creatinine, Ser: 2.1 mg/dL — ABNORMAL HIGH (ref 0.50–1.10)
GFR calc Af Amer: 30 mL/min — ABNORMAL LOW (ref >90)
GFR calc non Af Amer: 25 mL/min — ABNORMAL LOW (ref >90)
GFR calc non Af Amer: 26 mL/min — ABNORMAL LOW (ref >90)
Glucose, Bld: 897 mg/dL (ref 70–99)
Glucose, Bld: 722 mg/dL (ref 70–99)
Potassium: 6.2 mEq/L — ABNORMAL HIGH (ref 3.5–5.1)

## 2012-12-25 LAB — URINALYSIS, ROUTINE W REFLEX MICROSCOPIC
Bilirubin Urine: NEGATIVE
Specific Gravity, Urine: <1.005 — ABNORMAL LOW (ref 1.005–1.030)
pH: 7 (ref 5.0–8.0)

## 2012-12-25 LAB — CBC WITH DIFFERENTIAL/PLATELET
Eosinophils Absolute: 0.3 10*3/uL (ref 0.0–0.7)
Eosinophils Relative: 4 % (ref 0–5)
Hemoglobin: 12.6 g/dL (ref 12.0–15.0)
Lymphocytes Relative: 32 % (ref 12–46)
Lymphs Abs: 2.1 10*3/uL (ref 0.7–4.0)
MCH: 30.4 pg (ref 26.0–34.0)
MCV: 95.4 fL (ref 78.0–100.0)
Monocytes Relative: 6 % (ref 3–12)
Neutrophils Relative %: 57 % (ref 43–77)
RBC: 4.14 MIL/uL (ref 3.87–5.11)
WBC: 6.5 10*3/uL (ref 4.0–10.5)

## 2012-12-25 LAB — HEMOGLOBIN A1C: Hgb A1c MFr Bld: 9.6 % — ABNORMAL HIGH (ref <5.7)

## 2012-12-25 MED ORDER — SODIUM CHLORIDE 0.9 % IV SOLN: INTRAVENOUS | Status: DC

## 2012-12-25 MED ORDER — INSULIN REGULAR HUMAN 100 UNIT/ML IJ SOLN
INTRAMUSCULAR | Status: AC
  Filled 2012-12-25: qty 3

## 2012-12-25 MED ORDER — DEXTROSE-NACL 5-0.45 % IV SOLN
INTRAVENOUS | Status: DC
  Administered 2012-12-25: 08:00:00 via INTRAVENOUS

## 2012-12-25 MED ORDER — LAMOTRIGINE 200 MG PO TABS
200.0000 mg | ORAL_TABLET | Freq: Every day | ORAL | Status: DC
  Administered 2012-12-25: 200 mg via ORAL
  Filled 2012-12-25 (×3): qty 1

## 2012-12-25 MED ORDER — INSULIN GLARGINE 100 UNIT/ML ~~LOC~~ SOLN
35.0000 [IU] | Freq: Two times a day (BID) | SUBCUTANEOUS | Status: DC
  Administered 2012-12-25 – 2012-12-26 (×2): 35 [IU] via SUBCUTANEOUS
  Filled 2012-12-25 (×4): qty 0.35

## 2012-12-25 MED ORDER — LEVOTHYROXINE SODIUM 100 MCG PO TABS
100.0000 ug | ORAL_TABLET | Freq: Every day | ORAL | Status: DC
  Administered 2012-12-25 – 2012-12-26 (×2): 100 ug via ORAL
  Filled 2012-12-25 (×2): qty 1

## 2012-12-25 MED ORDER — SODIUM CHLORIDE 0.9 % IV SOLN
INTRAVENOUS | Status: DC
  Filled 2012-12-25: qty 1

## 2012-12-25 MED ORDER — HEPARIN SODIUM (PORCINE) 5000 UNIT/ML IJ SOLN
5000.0000 [IU] | Freq: Three times a day (TID) | INTRAMUSCULAR | Status: DC
  Administered 2012-12-25 – 2012-12-26 (×4): 5000 [IU] via SUBCUTANEOUS
  Filled 2012-12-25 (×4): qty 1

## 2012-12-25 MED ORDER — CARVEDILOL 12.5 MG PO TABS
37.5000 mg | ORAL_TABLET | Freq: Two times a day (BID) | ORAL | Status: DC
Start: 2012-12-25 — End: 2012-12-26
  Administered 2012-12-25 – 2012-12-26 (×3): 37.5 mg via ORAL
  Filled 2012-12-25 (×2): qty 3
  Filled 2012-12-25: qty 2

## 2012-12-25 MED ORDER — SODIUM CHLORIDE 0.9 % IV SOLN
INTRAVENOUS | Status: DC
  Administered 2012-12-25: 5.4 [IU]/h via INTRAVENOUS
  Filled 2012-12-25: qty 1

## 2012-12-25 MED ORDER — INSULIN ASPART 100 UNIT/ML ~~LOC~~ SOLN
0.0000 [IU] | SUBCUTANEOUS | Status: DC
  Administered 2012-12-25: 15 [IU] via SUBCUTANEOUS

## 2012-12-25 MED ORDER — SODIUM CHLORIDE 0.9 % IV SOLN
INTRAVENOUS | Status: DC
  Administered 2012-12-25 – 2012-12-26 (×3): via INTRAVENOUS

## 2012-12-25 MED ORDER — INSULIN ASPART 100 UNIT/ML ~~LOC~~ SOLN
7.0000 [IU] | Freq: Three times a day (TID) | SUBCUTANEOUS | Status: DC
  Administered 2012-12-26 (×2): 7 [IU] via SUBCUTANEOUS

## 2012-12-25 MED ORDER — ACETAMINOPHEN 650 MG RE SUPP: 650.0000 mg | Freq: Four times a day (QID) | RECTAL | Status: DC | PRN

## 2012-12-25 MED ORDER — LAMOTRIGINE 100 MG PO TABS
100.0000 mg | ORAL_TABLET | Freq: Every day | ORAL | Status: DC
  Administered 2012-12-25 – 2012-12-26 (×2): 100 mg via ORAL
  Filled 2012-12-25 (×3): qty 1

## 2012-12-25 MED ORDER — ASPIRIN EC 81 MG PO TBEC
81.0000 mg | DELAYED_RELEASE_TABLET | Freq: Every day | ORAL | Status: DC
  Administered 2012-12-25 – 2012-12-26 (×2): 81 mg via ORAL
  Filled 2012-12-25 (×2): qty 1

## 2012-12-25 MED ORDER — SODIUM CHLORIDE 0.9 % IV BOLUS (SEPSIS)
1000.0000 mL | Freq: Once | INTRAVENOUS | Status: AC
  Administered 2012-12-25: 1000 mL via INTRAVENOUS

## 2012-12-25 MED ORDER — SIMVASTATIN 20 MG PO TABS
40.0000 mg | ORAL_TABLET | Freq: Every day | ORAL | Status: DC
  Administered 2012-12-25: 40 mg via ORAL
  Filled 2012-12-25: qty 2

## 2012-12-25 MED ORDER — SODIUM CHLORIDE 0.9 % IJ SOLN
3.0000 mL | Freq: Two times a day (BID) | INTRAMUSCULAR | Status: DC
  Administered 2012-12-25: 3 mL via INTRAVENOUS

## 2012-12-25 MED ORDER — ACETAMINOPHEN 325 MG PO TABS
650.0000 mg | ORAL_TABLET | Freq: Four times a day (QID) | ORAL | Status: DC | PRN
  Administered 2012-12-25 (×2): 650 mg via ORAL
  Filled 2012-12-25 (×2): qty 2

## 2012-12-25 MED ORDER — INSULIN REGULAR BOLUS VIA INFUSION
0.0000 [IU] | Freq: Three times a day (TID) | INTRAVENOUS | Status: DC
  Administered 2012-12-25: 3 [IU] via INTRAVENOUS
  Filled 2012-12-25: qty 10

## 2012-12-25 MED ORDER — SODIUM POLYSTYRENE SULFONATE 15 GM/60ML PO SUSP
15.0000 g | Freq: Once | ORAL | Status: AC
  Administered 2012-12-25: 15 g via ORAL
  Filled 2012-12-25: qty 60

## 2012-12-25 MED ORDER — INSULIN ASPART 100 UNIT/ML ~~LOC~~ SOLN
0.0000 [IU] | SUBCUTANEOUS | Status: DC
  Administered 2012-12-25: 20 [IU] via SUBCUTANEOUS
  Administered 2012-12-25: 4 [IU] via SUBCUTANEOUS
  Administered 2012-12-25: 15 [IU] via SUBCUTANEOUS
  Administered 2012-12-26: 4 [IU] via SUBCUTANEOUS
  Administered 2012-12-26: 7 [IU] via SUBCUTANEOUS

## 2012-12-25 MED ORDER — INSULIN ASPART 100 UNIT/ML IV SOLN
10.0000 [IU] | Freq: Once | INTRAVENOUS | Status: AC
  Administered 2012-12-25: 10 [IU] via INTRAVENOUS

## 2012-12-25 MED ORDER — ONDANSETRON HCL 4 MG/2ML IJ SOLN: 4.0000 mg | Freq: Four times a day (QID) | INTRAMUSCULAR | Status: DC | PRN

## 2012-12-25 MED ORDER — DEXTROSE 50 % IV SOLN: 25.0000 mL | INTRAVENOUS | Status: DC | PRN

## 2012-12-25 MED ORDER — ALUM & MAG HYDROXIDE-SIMETH 200-200-20 MG/5ML PO SUSP
30.0000 mL | Freq: Four times a day (QID) | ORAL | Status: DC | PRN
  Administered 2012-12-25: 30 mL via ORAL
  Filled 2012-12-25: qty 30

## 2012-12-25 MED ORDER — ONDANSETRON HCL 4 MG PO TABS: 4.0000 mg | ORAL_TABLET | Freq: Four times a day (QID) | ORAL | Status: DC | PRN

## 2012-12-25 MED ORDER — INSULIN GLARGINE 100 UNIT/ML ~~LOC~~ SOLN
25.0000 [IU] | Freq: Two times a day (BID) | SUBCUTANEOUS | Status: DC
  Administered 2012-12-25: 25 [IU] via SUBCUTANEOUS
  Filled 2012-12-25 (×3): qty 0.25

## 2012-12-25 NOTE — Progress Notes (Signed)
Patient has a history of diabetes since the age of 42 years old. According to the H&P, patient is noted to have type 1 diabetes, but question whether she has type 2. Initial blood glucose noted to be 897 mg/dl and patient was not acidotic. Patient uses an insulin pump with Novolog insulin at home for diabetes management. Patient was initially placed on an insulin drip and is currently ordered Lantus 25 BID and Novolog 0-15 units Q4H for inpatient glycemic control. Patient was transitioned to SQ insulin this morning at which time she received Lantus 25 units at 10:40.   I talked with the patient and she states that she noticed that her blood glucose was getting elevated yesterday and she had changed out her infusion set 3 times but it did not help bring her blood sugar down so she came to the hospital.  Patient was here in March and April 2014 for hyperglycemia due to a malfunction with her insulin pump. According to the patient she sees Dr. Iran Planas with [facility] for diabetes management.  She has used Lantus and Novolog to manage her diabetes in the past when she was having issues with her insulin pump. She reports that she has not noticed any problems with the insulin pump itself recently. She states that she is not sure what is going on this time because the pump appears to be working okay.  However, she did report that when she removed the last infusion set she had inserted, it was indeed kinked. The patient has her insulin pump here in the hospital with her. The settings on the insulin pump were: basal requirement 59.2 units/day, sensitivity 1:25, carb ratio 1:3. Patient states that she prefers to do injections with Lantus and Novolog until she is able to go back and see Dr. [name].  The last A1C in the chart was 9.8% on 09/24/12 and patient reports that her last A1C done by Dr. [name] was in the 9% range also.   Currently patient has CBGs with Novolog moderate correction ordered Q4H.  If patient is  eating well, may want to change CBGs and Novolog correction to ACHS.  Will likely need to add meal coverage if patient is eating at least 50% of meals.  During the last admission, patient was ordered Novolog 10 units TID with meals.  Will continue to follow.  Thanks, Barnie Alderman, RN, MSN, CCRN Diabetes Coordinator Inpatient Diabetes Program [phone]

## 2012-12-25 NOTE — H&P (Signed)
[facility] History and Physical  Jasmine [name] [id number] DOB: 01-26-1971 DOA: 12/24/2012   PCP: Tula Nakayama  Specialists: She is followed by Dr. Iran Planas, endocrinologist. She's also followed by a nephrologist with [facility]  Chief Complaint: High blood sugar  HPI: Jasmine [name] is a 42 y.o. female with a past medical history of morbid obesity, diabetes, chronic kidney disease, hypertension, who was in her usual state of health till yesterday when she started noticing that her blood sugars were elevated. She is on insulin pump. She tried giving herself boluses through her insulin pump without any success. She started feeling weak, but denied any nausea, vomiting, or diarrhea. No fever, chills. No cough. No abdominal pain. As the blood sugar stayed in the 500s she decided to come in to the hospital. There has been no changes made to her pump recently. No changes made to her doses of insulin recently. Patient informed me that when she finally got the pump out, the tip of the catheter was kinked. So it is possible that this was the reason why patient may not have been getting her insulin. She was hospitalized earlier this year with similar problems.  Home Medications: Prior to Admission medications   Medication Sig Start Date End Date Taking? Authorizing Provider  acetaminophen (TYLENOL) 500 MG tablet Take 1,000 mg by mouth every 6 (six) hours as needed. For pain   Yes Historical Provider, MD  albuterol (PROVENTIL HFA;VENTOLIN HFA) 108 (90 BASE) MCG/ACT inhaler Inhale 2 puffs into the lungs every 6 (six) hours as needed for wheezing.   Yes Historical Provider, MD  aspirin EC 81 MG tablet Take 81 mg by mouth daily.   Yes Historical Provider, MD  carvedilol (COREG) 25 MG tablet Take 37.5 mg by mouth 2 (two) times daily with a meal.    Yes Historical Provider, MD  cholecalciferol (VITAMIN D) 1000 UNITS tablet Take 1,000 Units by mouth daily.   Yes Historical Provider,  MD  clonazePAM (KLONOPIN) 1 MG tablet Take 1 mg by mouth at bedtime.   Yes Historical Provider, MD  fish oil-omega-3 fatty acids 1000 MG capsule Take 1 g by mouth daily.   Yes Historical Provider, MD  fluticasone (FLONASE) 50 MCG/ACT nasal spray Place 2 sprays into the nose daily as needed for allergies.   Yes Historical Provider, MD  folic acid (FOLVITE) Q000111Q MCG tablet Take 800 mcg by mouth daily.   Yes Historical Provider, MD  hydrochlorothiazide (HYDRODIURIL) 25 MG tablet Take 25 mg by mouth daily.   Yes Historical Provider, MD  insulin aspart (NOVOLOG) 100 UNIT/ML injection Inject into the skin continuous. Insulin Pump   Yes Historical Provider, MD  Insulin Human (INSULIN PUMP) 100 unit/ml SOLN Follow previous pump settings. 09/27/12  Yes Rexene Alberts, MD  lamoTRIgine (LAMICTAL) 100 MG tablet Take 200 mg by mouth 2 (two) times daily. Takes 100 mg in the morning and 200 mg in the evening.   Yes Historical Provider, MD  levothyroxine (SYNTHROID, LEVOTHROID) 100 MCG tablet Take 100 mcg by mouth daily.   Yes Historical Provider, MD  lisinopril (PRINIVIL,ZESTRIL) 20 MG tablet Take 0.5 tablets (10 mg total) by mouth daily. 09/27/12  Yes Rexene Alberts, MD  mirtazapine (REMERON) 45 MG tablet Take 45 mg by mouth at bedtime.   Yes Historical Provider, MD  pravastatin (PRAVACHOL) 80 MG tablet Take 80 mg by mouth at bedtime.   Yes Historical Provider, MD  VITAMIN E PO Take 1 tablet by mouth daily.  Yes Historical Provider, MD    Allergies:  Allergies  Allergen Reactions  . Advair Diskus (Fluticasone-Salmeterol) Other (See Comments)    Thrush   . Biaxin (Clarithromycin) Rash    Past Medical History: Past Medical History  Diagnosis Date  . Hypertension   . Diabetes mellitus   . Bipolar disorder   . CKD (chronic kidney disease), stage II   . Elevated cholesterol   . GERD (gastroesophageal reflux disease)   . Stress incontinence   . Hypothyroidism   . Sleep apnea     HAS C -PAP / DOES NOT USE   . Anxiety   . Depression   . UTI (lower urinary tract infection)   . Tobacco abuse   . Obesity   . DKA, type 1 11/04/2011    Past Surgical History  Procedure Laterality Date  . Ovary removed    . Nasal fracture surgery    . Uterine fibroid surgery  2001  . Pubovaginal sling  08/16/2011    Procedure: [name];  Surgeon: Bernestine Amass, MD;  Location: [facility];  Service: Urology;  Laterality: N/A;         . Incontinence surgery      Social History:  reports that she has quit smoking. Her smoking use included Cigarettes. She has a 10 pack-year smoking history. She does not have any smokeless tobacco history on file. She reports that she does not drink alcohol or use illicit drugs.  Living Situation: She lives in [place] with her father Activity Level: Usually independent with daily activities   Family History:  Family History  Problem Relation Age of Onset  . Asthma Mother   . Heart disease Father   . Cancer Paternal Grandmother     lung and breast  . Bladder Cancer Paternal Grandfather      Review of Systems - History obtained from the patient General ROS: positive for  - fatigue Psychological ROS: negative Ophthalmic ROS: negative ENT ROS: negative Allergy and Immunology ROS: negative Hematological and Lymphatic ROS: negative Endocrine ROS: as in hpi Respiratory ROS: no cough, shortness of breath, or wheezing Cardiovascular ROS: no chest pain or dyspnea on exertion Gastrointestinal ROS: no abdominal pain, change in bowel habits, or black or bloody stools Genito-Urinary ROS: no dysuria, trouble voiding, or hematuria Musculoskeletal ROS: negative Neurological ROS: no TIA or stroke symptoms Dermatological ROS: negative  Physical Examination  Filed Vitals:   12/25/12 0024 12/25/12 0100 12/25/12 0212 12/25/12 0215  BP:  130/49 141/70   Pulse: 78 85 77   Temp:   97.9 F (36.6 C) 97.7 F (36.5 C)  TempSrc:   Oral Oral  Resp: 17 13    Height:   5\' 10"   (1.778 m) 5\' 10"  (1.778 m)  Weight:   137.9 kg (304 lb 0.2 oz) 137.9 kg (304 lb 0.2 oz)  SpO2: 95% 97% 97%     General appearance: alert, cooperative, appears stated age, no distress and morbidly obese Head: Normocephalic, without obvious abnormality, atraumatic Eyes: conjunctivae/corneas clear. PERRL, EOM's intact.  Throat: lips, mucosa, and tongue normal; teeth and gums normal Resp: clear to auscultation bilaterally Cardio: regular rate and rhythm, S1, S2 normal, no murmur, click, rub or gallop GI: soft, non-tender; bowel sounds normal; no masses,  no organomegaly Extremities: mild pitting edema bilaterally. No erythema or calf tenderness. Pulses: 2+ and symmetric Skin: Skin color, texture, turgor normal. No rashes or lesions Lymph nodes: Cervical, supraclavicular, and axillary nodes normal. Neurologic: She is alert and  oriented x3. No focal deficits are present  Laboratory Data: Results for orders placed during the hospital encounter of 12/24/12 (from the past 48 hour(s))  GLUCOSE, CAPILLARY     Status: Abnormal   Collection Time    12/24/12 11:17 PM      Result Value Range   Glucose-Capillary >600 (*) 70 - 99 mg/dL  BASIC METABOLIC PANEL     Status: Abnormal   Collection Time    12/24/12 11:23 PM      Result Value Range   Sodium 121 (*) 135 - 145 mEq/L   Potassium 6.2 (*) 3.5 - 5.1 mEq/L   Chloride 87 (*) 96 - 112 mEq/L   CO2 24  19 - 32 mEq/L   Glucose, Bld 897 (*) 70 - 99 mg/dL   Comment: RESULT REPEATED AND VERIFIED     CRITICAL RESULT CALLED TO, READ BACK BY AND VERIFIED WITH:      MEADOWS,G @ 0049 ON 12/25/12 BY WOODIE,J   BUN 57 (*) 6 - 23 mg/dL   Creatinine, Ser 2.40 (*) 0.50 - 1.10 mg/dL   Calcium 9.0  8.4 - 10.5 mg/dL   GFR calc non Af Amer 24 (*) >90 mL/min   GFR calc Af Amer 28 (*) >90 mL/min   Comment:            The eGFR has been calculated     using the CKD EPI equation.     This calculation has not been     validated in all clinical     situations.      eGFR's persistently     <90 mL/min signify     possible Chronic Kidney Disease.  CBC WITH DIFFERENTIAL     Status: None   Collection Time    12/24/12 11:23 PM      Result Value Range   WBC 6.5  4.0 - 10.5 K/uL   RBC 4.14  3.87 - 5.11 MIL/uL   Hemoglobin 12.6  12.0 - 15.0 g/dL   HCT 39.5  36.0 - 46.0 %   MCV 95.4  78.0 - 100.0 fL   MCH 30.4  26.0 - 34.0 pg   MCHC 31.9  30.0 - 36.0 g/dL   RDW 12.7  11.5 - 15.5 %   Platelets 238  150 - 400 K/uL   Neutrophils Relative % 57  43 - 77 %   Neutro Abs 3.7  1.7 - 7.7 K/uL   Lymphocytes Relative 32  12 - 46 %   Lymphs Abs 2.1  0.7 - 4.0 K/uL   Monocytes Relative 6  3 - 12 %   Monocytes Absolute 0.4  0.1 - 1.0 K/uL   Eosinophils Relative 4  0 - 5 %   Eosinophils Absolute 0.3  0.0 - 0.7 K/uL   Basophils Relative 1  0 - 1 %   Basophils Absolute 0.1  0.0 - 0.1 K/uL  POCT PREGNANCY, URINE     Status: None   Collection Time    12/24/12 11:32 PM      Result Value Range   Preg Test, Ur NEGATIVE  NEGATIVE   Comment:            THE SENSITIVITY OF THIS     METHODOLOGY IS >24 mIU/mL  URINALYSIS, ROUTINE W REFLEX MICROSCOPIC     Status: Abnormal   Collection Time    12/24/12 11:35 PM      Result Value Range   Color, Urine YELLOW  YELLOW  APPearance CLEAR  CLEAR   Specific Gravity, Urine <1.005 (*) 1.005 - 1.030   pH 7.0  5.0 - 8.0   Glucose, UA >1000 (*) NEGATIVE mg/dL   Hgb urine dipstick MODERATE (*) NEGATIVE   Bilirubin Urine NEGATIVE  NEGATIVE   Ketones, ur TRACE (*) NEGATIVE mg/dL   Protein, ur NEGATIVE  NEGATIVE mg/dL   Urobilinogen, UA 0.2  0.0 - 1.0 mg/dL   Nitrite NEGATIVE  NEGATIVE   Leukocytes, UA NEGATIVE  NEGATIVE  URINE MICROSCOPIC-ADD ON     Status: Abnormal   Collection Time    12/24/12 11:35 PM      Result Value Range   Squamous Epithelial / LPF FEW (*) RARE   WBC, UA 0-2  <3 WBC/hpf   RBC / HPF 0-2  <3 RBC/hpf   Bacteria, UA RARE  RARE   Urine-Other FEW YEAST    GLUCOSE, CAPILLARY     Status: Abnormal    Collection Time    12/25/12  1:05 AM      Result Value Range   Glucose-Capillary >600 (*) 70 - 99 mg/dL  GLUCOSE, CAPILLARY     Status: Abnormal   Collection Time    12/25/12  2:09 AM      Result Value Range   Glucose-Capillary >600 (*) 70 - 99 mg/dL    Radiology Reports: Dg Chest Portable 1 View  12/25/2012   *RADIOLOGY REPORT*  Clinical Data: - Shortness of breath.  PORTABLE CHEST - 1 VIEW  Comparison: One-view chest 09/24/2012.  Findings: The heart size is normal.  Lung markings are exaggerated by low lung volumes.  The visualized soft tissues and bony thorax are otherwise unremarkable.  IMPRESSION:  1.  No acute cardiopulmonary disease. 2.  Low lung volumes.   Original Report Authenticated By: San Morelle, M.D.    Electrocardiogram: EKG reveals sinus rhythm at 80 beats per minute. Left axis deviation is noted. Mildly prolonged QR interval is noted. Possible Q waves in V1 and V2. No concerning ST or T-wave changes are noted.  Problem List  Principal Problem:   Hyperglycemia Active Problems:   DM (diabetes mellitus), type 1, uncontrolled   Hyponatremia   CKD (chronic kidney disease), stage II   Hypothyroidism   HTN (hypertension)   Acute on chronic renal failure   Assessment: This is a 42 year old Caucasian female, who is morbidly obese presents with hyperglycemia. This is most likely due to her malfunctioning insulin pump. Her HbA1c in from April was 9.8, implying poor control at baseline.  Plan: #1 hyperglycemia in the setting of a known diabetes: Insulin pump has been removed. She'll be initiated on insulin infusion. We will repeat another HbA1c. If it continues to be elevated then it's possible that patient is not on an adequate regimen. She'll be monitored in step down unit. Diabetes coordinator will be consulted.  #2 acute on chronic renal failure with hyperkalemia: This should improve with IV hydration. Continue to monitor urine output. Potassium level should  improve with IV hydration and IV insulin. However, she might require Kayexalate. We will repeat another dose once she goes up to the step down unit.  #3 pseudohyponatremia: Corrected sodium level is 134.  #4 hypothyroidism: Continue with her Synthroid.  #5 history of hypertension: Follow and monitor blood pressures. Hold her ACE inhibitor due to her worsening renal function.  She does have mild pedal edema, which could be secondary to her renal disease. Continue to monitor.  DVT Prophylaxis: Heparin Code Status: Full code Family  Communication: Discussed with the patient  Disposition Plan: Admit to step down   Further management decisions will depend on results of further testing and patient's response to treatment.  [facility]  [facility] Pager [phone]  If 7PM-7AM, please contact night-coverage [email or web address] Password [facility]  12/25/2012, 2:24 AM

## 2012-12-25 NOTE — Progress Notes (Addendum)
The patient was seen initially at approximately 8:00 AM this morning. Her blood glucose was 193. The patient is a 42 year old woman with a history significant for type 1 diabetes on an insulin pump, chronic kidney disease, and obesity, who was admitted this morning for elevated blood sugar at home. Apparently, her insulin pump malfunction. She was briefly seen and examined. Her chart, laboratory studies, and vitals were reviewed. Her blood glucose had improved, but now is trending back up. We'll continue every 4 hours sliding scale, but will change it to the resistive scale. We'll at mealtime coverage of 7 units. Will increase twice a day Lantus to 35 units twice a day. Continue IV fluid hydration.

## 2012-12-25 NOTE — Progress Notes (Signed)
UR chart review completed.  

## 2012-12-25 NOTE — ED Notes (Signed)
Pt on cardiac monitor, 3L O2 [place] per EDP. AC called for insulin drip.

## 2012-12-25 NOTE — ED Notes (Signed)
Glucose 897, EDP aware

## 2012-12-26 ENCOUNTER — Encounter (HOSPITAL_COMMUNITY): Payer: Self-pay

## 2012-12-26 ENCOUNTER — Emergency Department (HOSPITAL_COMMUNITY)
Admission: EM | Admit: 2012-12-26 | Discharge: 2012-12-26 | Disposition: A | Discharge status: Home / Self Care | Payer: Medicare Other | Attending: Emergency Medicine | Admitting: Emergency Medicine

## 2012-12-26 DIAGNOSIS — R079 Chest pain, unspecified: Secondary | ICD-10-CM | POA: Insufficient documentation

## 2012-12-26 DIAGNOSIS — F410 Panic disorder [episodic paroxysmal anxiety] without agoraphobia: Secondary | ICD-10-CM

## 2012-12-26 DIAGNOSIS — Z87891 Personal history of nicotine dependence: Secondary | ICD-10-CM | POA: Insufficient documentation

## 2012-12-26 DIAGNOSIS — I129 Hypertensive chronic kidney disease with stage 1 through stage 4 chronic kidney disease, or unspecified chronic kidney disease: Secondary | ICD-10-CM | POA: Insufficient documentation

## 2012-12-26 DIAGNOSIS — N189 Chronic kidney disease, unspecified: Secondary | ICD-10-CM

## 2012-12-26 DIAGNOSIS — F3289 Other specified depressive episodes: Secondary | ICD-10-CM | POA: Insufficient documentation

## 2012-12-26 DIAGNOSIS — G4700 Insomnia, unspecified: Secondary | ICD-10-CM | POA: Insufficient documentation

## 2012-12-26 DIAGNOSIS — F329 Major depressive disorder, single episode, unspecified: Secondary | ICD-10-CM | POA: Insufficient documentation

## 2012-12-26 DIAGNOSIS — IMO0002 Reserved for concepts with insufficient information to code with codable children: Secondary | ICD-10-CM | POA: Insufficient documentation

## 2012-12-26 DIAGNOSIS — E871 Hypo-osmolality and hyponatremia: Secondary | ICD-10-CM

## 2012-12-26 DIAGNOSIS — Z794 Long term (current) use of insulin: Secondary | ICD-10-CM | POA: Insufficient documentation

## 2012-12-26 DIAGNOSIS — Z7982 Long term (current) use of aspirin: Secondary | ICD-10-CM | POA: Insufficient documentation

## 2012-12-26 DIAGNOSIS — F319 Bipolar disorder, unspecified: Secondary | ICD-10-CM | POA: Insufficient documentation

## 2012-12-26 DIAGNOSIS — N179 Acute kidney failure, unspecified: Secondary | ICD-10-CM

## 2012-12-26 DIAGNOSIS — E119 Type 2 diabetes mellitus without complications: Secondary | ICD-10-CM | POA: Insufficient documentation

## 2012-12-26 DIAGNOSIS — E1065 Type 1 diabetes mellitus with hyperglycemia: Secondary | ICD-10-CM

## 2012-12-26 DIAGNOSIS — N390 Urinary tract infection, site not specified: Secondary | ICD-10-CM | POA: Insufficient documentation

## 2012-12-26 DIAGNOSIS — F411 Generalized anxiety disorder: Secondary | ICD-10-CM | POA: Insufficient documentation

## 2012-12-26 DIAGNOSIS — E7800 Pure hypercholesterolemia, unspecified: Secondary | ICD-10-CM | POA: Insufficient documentation

## 2012-12-26 DIAGNOSIS — R55 Syncope and collapse: Secondary | ICD-10-CM | POA: Insufficient documentation

## 2012-12-26 DIAGNOSIS — Z79899 Other long term (current) drug therapy: Secondary | ICD-10-CM | POA: Insufficient documentation

## 2012-12-26 DIAGNOSIS — E039 Hypothyroidism, unspecified: Secondary | ICD-10-CM | POA: Insufficient documentation

## 2012-12-26 DIAGNOSIS — Z87440 Personal history of urinary (tract) infections: Secondary | ICD-10-CM | POA: Insufficient documentation

## 2012-12-26 DIAGNOSIS — N182 Chronic kidney disease, stage 2 (mild): Secondary | ICD-10-CM | POA: Insufficient documentation

## 2012-12-26 DIAGNOSIS — E669 Obesity, unspecified: Secondary | ICD-10-CM | POA: Insufficient documentation

## 2012-12-26 LAB — CBC
HCT: 36 % (ref 36.0–46.0)
MCH: 30.8 pg (ref 26.0–34.0)
MCHC: 34.2 g/dL (ref 30.0–36.0)
MCV: 90 fL (ref 78.0–100.0)
RDW: 12.2 % (ref 11.5–15.5)

## 2012-12-26 LAB — COMPREHENSIVE METABOLIC PANEL
ALT: 9 U/L (ref 0–35)
Albumin: 3 g/dL — ABNORMAL LOW (ref 3.5–5.2)
Alkaline Phosphatase: 89 U/L (ref 39–117)
Potassium: 4.3 mEq/L (ref 3.5–5.1)
Sodium: 137 mEq/L (ref 135–145)
Total Protein: 6.3 g/dL (ref 6.0–8.3)

## 2012-12-26 LAB — GLUCOSE, CAPILLARY
Glucose-Capillary: 203 mg/dL — ABNORMAL HIGH (ref 70–99)
Glucose-Capillary: 255 mg/dL — ABNORMAL HIGH (ref 70–99)

## 2012-12-26 MED ORDER — INSULIN ASPART 100 UNIT/ML ~~LOC~~ SOLN: SUBCUTANEOUS | Status: DC

## 2012-12-26 MED ORDER — INSULIN GLARGINE 100 UNIT/ML ~~LOC~~ SOLN: 35.0000 [IU] | Freq: Two times a day (BID) | SUBCUTANEOUS | Status: DC

## 2012-12-26 NOTE — ED Provider Notes (Signed)
History     This chart was scribed for [name], MD by Jeralyn Ruths, ED Scribe. The patient was seen in room APA04/APA04 and the patient's care was started at 6:15 PM.  CSN: [id number] Arrival date & time 12/26/12  1722   Chief Complaint  Patient presents with  . Chest Pain   The history is provided by the patient and medical records. No language interpreter was used.   HPI Comments: Jasmine [name] is a 42 y.o. female with a h/o HTN, DM, GERD, and bipolar disorder who presents to the Emergency Department complaining of chest pain onset today.  Pt was admitted to the hospital Sunday, 2 days ago and released today.  She claims she returned home around 1pm. She  was playing Bingo, pulling up the numbers to call and she dropped a ball. When she started to bend down to pick up the ball  she fell to the floor.  Husband states that she was a bit pale when this happened.  She states that she had blurred vision just before she bent over then she felt dizzy and mild spinning and a feeling she was going to pass out and had chest pain associated with this episode.  She describes the chest pain as "gripping" and reports that it lasted a few minutes. She denies LOC and states she fell.  She denies injury from the fall.  She denies SOB, numbness or tingling in her extremities with this incident.  Pt believes she had a panic attack once she fell and again in the ambulance which wasn't as bad.  She reports that she quit smoking 3 months ago, and has not used electronic cigarette in a few days.  Pt denies headache, diaphoresis, fever, chills, nausea, vomiting, diarrhea, weakness, cough and any other pain.  She reports panic attack in ambulance as well.  Pt claims she feels fine now.  PCP Dr Deatra Ina with [facility] with [facility], Dr Lorrene Reid  Dr. Iran Planas is endocrinologist    Past Medical History  Diagnosis Date  . Hypertension   . Diabetes mellitus   . Bipolar disorder    . CKD (chronic kidney disease), stage II   . Elevated cholesterol   . GERD (gastroesophageal reflux disease)   . Stress incontinence   . Hypothyroidism   . Sleep apnea     HAS C -PAP / DOES NOT USE  . Anxiety   . Depression   . UTI (lower urinary tract infection)   . Tobacco abuse   . Obesity   . DKA, type 1 11/04/2011   Past Surgical History  Procedure Laterality Date  . Ovary removed    . Nasal fracture surgery    . Uterine fibroid surgery  2001  . Pubovaginal sling  08/16/2011    Procedure: [name];  Surgeon: Bernestine Amass, MD;  Location: [facility];  Service: Urology;  Laterality: N/A;         . Incontinence surgery     Family History  Problem Relation Age of Onset  . Asthma Mother   . Heart disease Father   . Cancer Paternal Grandmother     lung and breast  . Bladder Cancer Paternal Grandfather    History  Substance Use Topics  . Smoking status: Former Smoker -- 0.50 packs/day for 20 years    Types: Cigarettes  . Smokeless tobacco: Not on file  . Alcohol Use: No  quit smoking 3 months ago On disability  for bipolar and CRI Lives at home  Lives with spouse  OB History   Grav Para Term Preterm Abortions TAB SAB Ect Mult Living                 Review of Systems  All other systems reviewed and are negative.    Allergies  Advair diskus and Biaxin  Home Medications   Current Outpatient Rx  Name  Route  Sig  Dispense  Refill  . acetaminophen (TYLENOL) 500 MG tablet   Oral   Take 1,000 mg by mouth every 6 (six) hours as needed. For pain         . albuterol (PROVENTIL HFA;VENTOLIN HFA) 108 (90 BASE) MCG/ACT inhaler   Inhalation   Inhale 2 puffs into the lungs every 6 (six) hours as needed for wheezing.         Marland Kitchen aspirin EC 81 MG tablet   Oral   Take 81 mg by mouth daily.         . carvedilol (COREG) 25 MG tablet   Oral   Take 37.5 mg by mouth 2 (two) times daily with a meal.          . cholecalciferol (VITAMIN D) 1000 UNITS  tablet   Oral   Take 1,000 Units by mouth daily.         . clonazePAM (KLONOPIN) 1 MG tablet   Oral   Take 1 mg by mouth at bedtime.         . fish oil-omega-3 fatty acids 1000 MG capsule   Oral   Take 1 g by mouth daily.         . fluticasone (FLONASE) 50 MCG/ACT nasal spray   Nasal   Place 2 sprays into the nose daily as needed for allergies.         . folic acid (FOLVITE) Q000111Q MCG tablet   Oral   Take 800 mcg by mouth daily.         . hydrochlorothiazide (HYDRODIURIL) 25 MG tablet   Oral   Take 25 mg by mouth daily.         . insulin aspart (NOVOLOG) 100 UNIT/ML injection      Sliding scale NovoLog: Blood sugar less than 120, 0 units. Blood sugar 120-150 give 4 units. Blood sugar 151-200 give 6 units. Blood sugar 201-250 give 8 units. Blood sugar 251-300 give 10 units. Blood sugar 300-350 give 12 units. Blood sugar 351-400 give 15 units. Blood sugar greater than 400 give 20 units and call your doctor.         . insulin glargine (LANTUS) 100 UNIT/ML injection   Subcutaneous   Inject 0.35 mLs (35 Units total) into the skin 2 (two) times daily.         Marland Kitchen lamoTRIgine (LAMICTAL) 100 MG tablet   Oral   Take 200 mg by mouth 2 (two) times daily. Takes 100 mg in the morning and 200 mg in the evening.         Marland Kitchen levothyroxine (SYNTHROID, LEVOTHROID) 100 MCG tablet   Oral   Take 100 mcg by mouth daily.         Marland Kitchen lisinopril (PRINIVIL,ZESTRIL) 20 MG tablet   Oral   Take 0.5 tablets (10 mg total) by mouth daily.         . mirtazapine (REMERON) 45 MG tablet   Oral   Take 45 mg by mouth at bedtime.         Marland Kitchen  pravastatin (PRAVACHOL) 80 MG tablet   Oral   Take 80 mg by mouth at bedtime.         Marland Kitchen VITAMIN E PO   Oral   Take 1 tablet by mouth daily.          BP 166/75  Pulse 77  Temp(Src) 98.2 F (36.8 C) (Oral)  Resp 16  Ht 5\' 10"  (1.778 m)  Wt 300 lb (136.079 kg)  BMI 43.05 kg/m2  SpO2 92%  Vital signs normal    Physical Exam  Nursing  note and vitals reviewed. Constitutional: She is oriented to person, place, and time. She appears well-developed and well-nourished.  Non-toxic appearance. She does not appear ill. No distress.  HENT:  Head: Normocephalic and atraumatic.  Right Ear: External ear normal.  Left Ear: External ear normal.  Nose: Nose normal. No mucosal edema or rhinorrhea.  Mouth/Throat: Oropharynx is clear and moist and mucous membranes are normal. No dental abscesses or edematous.  Eyes: Conjunctivae and EOM are normal. Pupils are equal, round, and reactive to light.  Neck: Normal range of motion and full passive range of motion without pain. Neck supple.  Cardiovascular: Normal rate, regular rhythm and normal heart sounds.  Exam reveals no gallop and no friction rub.   No murmur heard. Pulmonary/Chest: Effort normal and breath sounds normal. No respiratory distress. She has no wheezes. She has no rhonchi. She has no rales. She exhibits no tenderness and no crepitus.  Abdominal: Soft. Normal appearance and bowel sounds are normal. She exhibits no distension. There is no tenderness. There is no rebound and no guarding.  Musculoskeletal: Normal range of motion. She exhibits no edema and no tenderness.  Moves all extremities well.   Neurological: She is alert and oriented to person, place, and time. She has normal strength. No cranial nerve deficit.  Skin: Skin is warm, dry and intact. No rash noted. No erythema. No pallor.  Psychiatric: She has a normal mood and affect. Her speech is normal and behavior is normal. Her mood appears not anxious.    ED Course  Procedures (including critical care time)    DIAGNOSTIC STUDIES:  COORDINATION OF CARE: 6:22 PM - Discussed ED treatment with pt at bedside including checking bp and pt agrees.   Orthostatic VS are normal and patient ambulated without difficulty. Feels ready to go home.   Labs Reviewed  GLUCOSE, CAPILLARY - Abnormal; Notable for the following:     Glucose-Capillary 255 (*)    All other components within normal limits   Dg Chest Portable 1 View  12/25/2012 .  IMPRESSION:  1.  No acute cardiopulmonary disease. 2.  Low lung volumes.   Original Report Authenticated By: San Morelle, M.D.    Date: 12/26/2012  Rate: 73  Rhythm: normal sinus rhythm  QRS Axis: normal  Intervals: normal  ST/T Wave abnormalities: normal  Conduction Disutrbances:none  Narrative Interpretation: low voltage  Old EKG Reviewed: unchanged from 12/24/2012    1. Near syncope   2. Chest pain   3. Anxiety attack    Plan discharge   Rolland Porter, MD, FACEP   MDM   I personally performed the services described in this documentation, which was scribed in my presence. The recorded information has been reviewed and considered.  Rolland Porter, MD, FACEP    [name], MD 12/26/12 [phone]

## 2012-12-26 NOTE — Discharge Summary (Signed)
Physician Discharge Summary  Jasmine [name] [id number] DOB: 11-26-1970 DOA: 12/24/2012  PCP: Tula Nakayama  Admit date: 12/24/2012 Discharge date: 12/26/2012  Time spent: Greater than 30 minutes  Recommendations for Outpatient Follow-up:  1. The patient was instructed to followup with her endocrinologist in [facility].  Discharge Diagnoses:  1. Uncontrolled type 1 diabetes mellitus. Not in DKA. 2. Hyponatremia secondary to pseudohyponatremia from hyperglycemia. Resolved. 3. Hyperkalemia, resolved. 4. 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 Creatinine 1.88 at the time of discharge. 5. Hypertension. 6. Hypothyroidism. 7. Obesity.  Discharge Condition: Improved.  Diet recommendation: Heart healthy and carbohydrate modified.  Filed Weights   12/25/12 0212 12/25/12 0215 12/26/12 0500  Weight: 137.9 kg (304 lb 0.2 oz) 137.9 kg (304 lb 0.2 oz) 139.2 kg (306 lb 14.1 oz)    History of present illness:  The patient is a 42 year old with a history of type 1 diabetes mellitus, treated with an insulin pump, chronic kidney disease, and hypertension, who presented to the emergency department on 12/25/2012 with high blood sugars at home. Her blood glucose was running high at home. She subsequently checked her pump and found that the tip of the catheter was kinked. Therefore, she assumed that she had not been getting insulin for the entire day. In the emergency department, she was afebrile and hemodynamically stable. Her lab data were significant for a venous glucose of 897, sodium of 121, potassium of 6.2, UA and 57, and creatinine of 2.40. Her anion gap was within normal limits. Her CBC was within normal limits. She was admitted for further evaluation and management.  Hospital Course:   The patient was admitted to the step down unit on an insulin drip. IV fluids were started for hydration. Her serum potassium was monitored closely and it was  assumed that it would decrease with IV fluid hydration without additional intervention. Her followup serum potassium did normalize following vigorous IV fluids. Her serum sodium also progressively improved with normal saline. Her capillary blood glucose was monitored closely. Her basic metabolic panel was also monitored closely. When her venous glucose/capillary blood glucose improved significantly, the insulin drip was discontinued in favor of every 4 hours sliding-scale NovoLog and twice a day dosing of Lantus. Her diet was advanced, which he tolerated well.  The patient was maintained on most of her chronic medications. Her renal function improved as well as her serum sodium and potassium. Her capillary blood glucoses was in the 200s prior to discharge. She was discharged on sliding scale NovoLog before each meal and at bedtime and twice a day dosing of Lantus. She was instructed to followup with her endocrinologist in [facility] to reassess whether she is an ideal patient for further therapy with an insulin pump versus traditional treatment with sliding scale NovoLog and Lantus. She voiced understanding.  Procedures:  None  Consultations:  None  Discharge Exam: Filed Vitals:   12/26/12 0900 12/26/12 1000 12/26/12 1100 12/26/12 1200  BP:      Pulse: 74 76 73   Temp:    97.3 F (36.3 C)  TempSrc:    Oral  Resp: 19 16 15    Height:      Weight:      SpO2: 96% 88% 91%     General: Pleasant obese 42 year old Caucasian woman sitting up in bed, in no acute distress. Cardiovascular: S1, S2, with no murmurs rubs or gallops. Respiratory: Occasional crackles in the mid lobes, breathing nonlabored.  Discharge  Instructions  Discharge Orders   Future Orders Complete By Expires     Diet - low sodium heart healthy  As directed     Diet Carb Modified  As directed     Discharge instructions  As directed     Comments:      Followup with your diabetes Dr. as scheduled or sooner.    Increase  activity slowly  As directed         Medication List         acetaminophen 500 MG tablet  Commonly known as:  TYLENOL  Take 1,000 mg by mouth every 6 (six) hours as needed. For pain     albuterol 108 (90 BASE) MCG/ACT inhaler  Commonly known as:  PROVENTIL HFA;VENTOLIN HFA  Inhale 2 puffs into the lungs every 6 (six) hours as needed for wheezing.     aspirin EC 81 MG tablet  Take 81 mg by mouth daily.     carvedilol 25 MG tablet  Commonly known as:  COREG  Take 37.5 mg by mouth 2 (two) times daily with a meal.     cholecalciferol 1000 UNITS tablet  Commonly known as:  VITAMIN D  Take 1,000 Units by mouth daily.     clonazePAM 1 MG tablet  Commonly known as:  KLONOPIN  Take 1 mg by mouth at bedtime.     fish oil-omega-3 fatty acids 1000 MG capsule  Take 1 g by mouth daily.     fluticasone 50 MCG/ACT nasal spray  Commonly known as:  FLONASE  Place 2 sprays into the nose daily as needed for allergies.     folic acid Q000111Q MCG tablet  Commonly known as:  FOLVITE  Take 800 mcg by mouth daily.     hydrochlorothiazide 25 MG tablet  Commonly known as:  HYDRODIURIL  Take 25 mg by mouth daily.     insulin aspart 100 UNIT/ML injection  Commonly known as:  novoLOG  Sliding scale NovoLog: Blood sugar less than 120, 0 units. Blood sugar 120-150 give 4 units. Blood sugar 151-200 give 6 units. Blood sugar 201-250 give 8 units. Blood sugar 251-300 give 10 units. Blood sugar 300-350 give 12 units. Blood sugar 351-400 give 15 units. Blood sugar greater than 400 give 20 units and call your doctor.     insulin glargine 100 UNIT/ML injection  Commonly known as:  LANTUS  Inject 0.35 mLs (35 Units total) into the skin 2 (two) times daily.     lamoTRIgine 100 MG tablet  Commonly known as:  LAMICTAL  Take 200 mg by mouth 2 (two) times daily.     levothyroxine 100 MCG tablet  Commonly known as:  SYNTHROID, LEVOTHROID  Take 100 mcg by mouth daily.     lisinopril 20 MG tablet  Commonly  known as:  PRINIVIL,ZESTRIL  Take 0.5 tablets (10 mg total) by mouth daily.     mirtazapine 45 MG tablet  Commonly known as:  REMERON  Take 45 mg by mouth at bedtime.     pravastatin 80 MG tablet  Commonly known as:  PRAVACHOL  Take 80 mg by mouth at bedtime.     VITAMIN E PO  Take 1 tablet by mouth daily.       Allergies  Allergen Reactions  . Advair Diskus (Fluticasone-Salmeterol) Other (See Comments)    Thrush   . Biaxin (Clarithromycin) Rash       Follow-up Information   Follow up with Roni Bread, MD. (Follow up  as scheduled.)    Contact information:   [id number] [place] [address] [phone]        The results of significant diagnostics from this hospitalization (including imaging, microbiology, ancillary and laboratory) are listed below for reference.    Significant Diagnostic Studies: Dg Chest Portable 1 View  12/25/2012   *RADIOLOGY REPORT*  Clinical Data: - Shortness of breath.  PORTABLE CHEST - 1 VIEW  Comparison: One-view chest 09/24/2012.  Findings: The heart size is normal.  Lung markings are exaggerated by low lung volumes.  The visualized soft tissues and bony thorax are otherwise unremarkable.  IMPRESSION:  1.  No acute cardiopulmonary disease. 2.  Low lung volumes.   Original Report Authenticated By: San Morelle, M.D.    Microbiology: No results found for this or any previous visit (from the past 240 hour(s)).   Labs: Basic Metabolic Panel:  Recent Labs Lab 12/24/12 2323 12/25/12 0234 12/25/12 0529 12/25/12 0819 12/26/12 0519  NA 121* 123* 133* 133* 137  K 6.2* 4.8 4.3 4.3 4.3  CL 87* 90* 98 100 104  CO2 24 24 27 26 24   GLUCOSE 897* 722* 316* 193* 126*  BUN 57* 55* 53* 52* 42*  CREATININE 2.40* 2.31* 2.27* 2.10* 1.88*  CALCIUM 9.0 8.8 9.3 9.2 8.7   Liver Function Tests:  Recent Labs Lab 12/26/12 0519  AST 9  ALT 9  ALKPHOS 89  BILITOT 0.2*  PROT 6.3  ALBUMIN 3.0*   No results found for this  basename: LIPASE, AMYLASE,  in the last 168 hours No results found for this basename: AMMONIA,  in the last 168 hours CBC:  Recent Labs Lab 12/24/12 2323 12/26/12 0519  WBC 6.5 6.0  NEUTROABS 3.7  --   HGB 12.6 12.3  HCT 39.5 36.0  MCV 95.4 90.0  PLT 238 243   Cardiac Enzymes: No results found for this basename: CKTOTAL, CKMB, CKMBINDEX, TROPONINI,  in the last 168 hours BNP: BNP (last 3 results) No results found for this basename: PROBNP,  in the last 8760 hours CBG:  Recent Labs Lab 12/25/12 2331 12/26/12 0312 12/26/12 0741 12/26/12 1122 12/26/12 1723  GLUCAP 158* 81 186* 203* 255*       Signed:  Kasondra Junod  [facility] 12/26/2012, 8:03 PM

## 2012-12-26 NOTE — ED Notes (Signed)
Patient assisted ambulating around the nurses station back to room. Tolerated well. Patient states she is feeling better.

## 2012-12-26 NOTE — ED Notes (Signed)
Pt standing at sink when entered the room. Pt had removed the IV herself. Pt states she is feeling fine & ready to go. EDP notified.

## 2012-12-26 NOTE — Progress Notes (Signed)
Discharge instructions given and patient verbalized understanding of discharge instructions.  No acute distress noted.  Ambulated out to awaiting vehicle for discharge home.

## 2012-12-26 NOTE — ED Notes (Signed)
Per ems, pt was d/c from [facility] today.  Pt reports standing up and felt a pain to her central chest.  Pt reports " it felt like a panic attack".  Pt denies chest pain upon arrival.

## 2012-12-26 NOTE — ED Notes (Signed)
Thinks she had a panic attack, prior to arrival

## 2012-12-26 NOTE — ED Notes (Signed)
Pt alert & oriented x4, stable gait. Patient given discharge instructions, paperwork & prescription(s). Patient  instructed to stop at the registration desk to finish any additional paperwork. Patient verbalized understanding. Pt left department w/ no further questions. 

## 2013-04-07 ENCOUNTER — Encounter (HOSPITAL_COMMUNITY): Payer: Self-pay | Admitting: Emergency Medicine

## 2013-04-07 ENCOUNTER — Emergency Department (HOSPITAL_COMMUNITY)
Admission: EM | Admit: 2013-04-07 | Discharge: 2013-04-07 | Disposition: A | Discharge status: Home / Self Care | Payer: Medicare Other | Attending: Emergency Medicine | Admitting: Emergency Medicine

## 2013-04-07 DIAGNOSIS — K219 Gastro-esophageal reflux disease without esophagitis: Secondary | ICD-10-CM | POA: Insufficient documentation

## 2013-04-07 DIAGNOSIS — R05 Cough: Secondary | ICD-10-CM | POA: Insufficient documentation

## 2013-04-07 DIAGNOSIS — Z8742 Personal history of other diseases of the female genital tract: Secondary | ICD-10-CM | POA: Insufficient documentation

## 2013-04-07 DIAGNOSIS — F319 Bipolar disorder, unspecified: Secondary | ICD-10-CM | POA: Insufficient documentation

## 2013-04-07 DIAGNOSIS — E7800 Pure hypercholesterolemia, unspecified: Secondary | ICD-10-CM | POA: Insufficient documentation

## 2013-04-07 DIAGNOSIS — R002 Palpitations: Secondary | ICD-10-CM

## 2013-04-07 DIAGNOSIS — E1010 Type 1 diabetes mellitus with ketoacidosis without coma: Secondary | ICD-10-CM | POA: Insufficient documentation

## 2013-04-07 DIAGNOSIS — Z87440 Personal history of urinary (tract) infections: Secondary | ICD-10-CM | POA: Insufficient documentation

## 2013-04-07 DIAGNOSIS — N182 Chronic kidney disease, stage 2 (mild): Secondary | ICD-10-CM | POA: Insufficient documentation

## 2013-04-07 DIAGNOSIS — I129 Hypertensive chronic kidney disease with stage 1 through stage 4 chronic kidney disease, or unspecified chronic kidney disease: Secondary | ICD-10-CM | POA: Insufficient documentation

## 2013-04-07 DIAGNOSIS — F411 Generalized anxiety disorder: Secondary | ICD-10-CM | POA: Insufficient documentation

## 2013-04-07 DIAGNOSIS — Z7982 Long term (current) use of aspirin: Secondary | ICD-10-CM | POA: Insufficient documentation

## 2013-04-07 DIAGNOSIS — R059 Cough, unspecified: Secondary | ICD-10-CM | POA: Insufficient documentation

## 2013-04-07 DIAGNOSIS — E039 Hypothyroidism, unspecified: Secondary | ICD-10-CM | POA: Insufficient documentation

## 2013-04-07 DIAGNOSIS — I499 Cardiac arrhythmia, unspecified: Secondary | ICD-10-CM | POA: Insufficient documentation

## 2013-04-07 DIAGNOSIS — Z87891 Personal history of nicotine dependence: Secondary | ICD-10-CM | POA: Insufficient documentation

## 2013-04-07 DIAGNOSIS — F3289 Other specified depressive episodes: Secondary | ICD-10-CM | POA: Insufficient documentation

## 2013-04-07 DIAGNOSIS — F329 Major depressive disorder, single episode, unspecified: Secondary | ICD-10-CM | POA: Insufficient documentation

## 2013-04-07 DIAGNOSIS — Z794 Long term (current) use of insulin: Secondary | ICD-10-CM | POA: Insufficient documentation

## 2013-04-07 DIAGNOSIS — Z79899 Other long term (current) drug therapy: Secondary | ICD-10-CM | POA: Insufficient documentation

## 2013-04-07 DIAGNOSIS — R609 Edema, unspecified: Secondary | ICD-10-CM | POA: Insufficient documentation

## 2013-04-07 LAB — COMPREHENSIVE METABOLIC PANEL
ALT: 12 U/L (ref 0–35)
AST: 15 U/L (ref 0–37)
Alkaline Phosphatase: 119 U/L — ABNORMAL HIGH (ref 39–117)
CO2: 32 mEq/L (ref 19–32)
Chloride: 94 mEq/L — ABNORMAL LOW (ref 96–112)
Glucose, Bld: 152 mg/dL — ABNORMAL HIGH (ref 70–99)
Potassium: 4.1 mEq/L (ref 3.5–5.1)
Sodium: 134 mEq/L — ABNORMAL LOW (ref 135–145)

## 2013-04-07 LAB — CBC WITH DIFFERENTIAL/PLATELET
Eosinophils Relative: 4 % (ref 0–5)
HCT: 40.1 % (ref 36.0–46.0)
Lymphocytes Relative: 35 % (ref 12–46)
Lymphs Abs: 2.5 10*3/uL (ref 0.7–4.0)
MCV: 90.1 fL (ref 78.0–100.0)
Monocytes Absolute: 0.6 10*3/uL (ref 0.1–1.0)
Neutro Abs: 3.7 10*3/uL (ref 1.7–7.7)
RBC: 4.45 MIL/uL (ref 3.87–5.11)
WBC: 7.1 10*3/uL (ref 4.0–10.5)

## 2013-04-07 LAB — MAGNESIUM: Magnesium: 1.8 mg/dL (ref 1.5–2.5)

## 2013-04-07 LAB — GLUCOSE, CAPILLARY

## 2013-04-07 NOTE — ED Notes (Signed)
MD at bedside. 

## 2013-04-07 NOTE — ED Notes (Signed)
Pt stated that she has a known irregular heartbeat that she deals with on a daily basis, today it has been beating more irregularly and she has increased sob.

## 2013-04-07 NOTE — ED Provider Notes (Signed)
CSN: [id number]     Arrival date & time 04/07/13  1841 History   First MD Initiated Contact with Patient 04/07/13 1859     This chart was scribed for [name], MD by Forrestine Him, ED Scribe. This patient was seen in room APA07/APA07 and the patient's care was started 7:01 PM.   Chief Complaint  Patient presents with  . Irregular Heart Beat  . Shortness of Breath   Patient is a 42 y.o. female presenting with shortness of breath. The history is provided by the patient. No language interpreter was used.  Shortness of Breath Associated symptoms: cough    .HPI Comments: Jasmine [name] is a 42 y.o. female with a hx of HTN, arrythmia, and DM who presents to the Emergency Department complaining of chronic irregular heartbeat that has worsened today. Pt describes it as a "flip-flop" feeling, and reports the feeling comes about every 5 minutes. Pt also reports an associated wet nonproductive cough that started today, and intermittent SOB with light activity that she has had in the past. She denies any new medications, including OTC medications. Pt states she has had an arrythmia for 7 years now. She reports she had a holter monitor done about 7 years ago when it first started. She cannot tell me what they found on the test.  Pt denies nausea, emesis, chills, fever, rhinorrhea, sore throat. She states her doctor has gotten her an appointment with a cardiologist in [facility] on Nov 12th for the palpitations. She states her legs typically swell, and she is currently on lasix.   Pt is disabled for diabetes and bipolar disorder. She quite smoking about 8 months ago.  PCP Bing Matter PA-C   Past Medical History  Diagnosis Date  . Hypertension   . Diabetes mellitus   . Bipolar disorder   . CKD (chronic kidney disease), stage II   . Elevated cholesterol   . GERD (gastroesophageal reflux disease)   . Stress incontinence   . Hypothyroidism   . Sleep apnea     HAS C -PAP / DOES NOT USE  . Anxiety   .  Depression   . UTI (lower urinary tract infection)   . Tobacco abuse   . Obesity   . DKA, type 1 11/04/2011   Past Surgical History  Procedure Laterality Date  . Ovary removed    . Nasal fracture surgery    . Uterine fibroid surgery  2001  . Pubovaginal sling  08/16/2011    Procedure: [name];  Surgeon: Bernestine Amass, MD;  Location: [facility] ORS;  Service: Urology;  Laterality: N/A;         . Incontinence surgery     Family History  Problem Relation Age of Onset  . Asthma Mother   . Heart disease Father   . Cancer Paternal Grandmother     lung and breast  . Bladder Cancer Paternal Grandfather    History  Substance Use Topics  . Smoking status: Former Smoker -- 0.50 packs/day for 20 years    Types: Cigarettes  . Smokeless tobacco: Not on file  . Alcohol Use: No   Quit smoking 8 months ago On disability for bipolar and diabetes   OB History   Grav Para Term Preterm Abortions TAB SAB Ect Mult Living                 Review of Systems  Respiratory: Positive for cough and shortness of breath.   Cardiovascular:  Irregular heartbeat   All other systems reviewed and are negative.    Allergies  Advair diskus and Biaxin  Home Medications   Current Outpatient Rx  Name  Route  Sig  Dispense  Refill  . acetaminophen (TYLENOL) 500 MG tablet   Oral   Take 1,000 mg by mouth every 6 (six) hours as needed. For pain         . albuterol (PROVENTIL HFA;VENTOLIN HFA) 108 (90 BASE) MCG/ACT inhaler   Inhalation   Inhale 2 puffs into the lungs every 6 (six) hours as needed for wheezing.         Marland Kitchen aspirin EC 81 MG tablet   Oral   Take 81 mg by mouth daily.         . carvedilol (COREG) 25 MG tablet   Oral   Take 37.5 mg by mouth 2 (two) times daily with a meal.          . cholecalciferol (VITAMIN D) 1000 UNITS tablet   Oral   Take 1,000 Units by mouth daily.         . clonazePAM (KLONOPIN) 1 MG tablet   Oral   Take 1 mg by mouth at bedtime.          . fish oil-omega-3 fatty acids 1000 MG capsule   Oral   Take 1 g by mouth daily.         . fluticasone (FLONASE) 50 MCG/ACT nasal spray   Nasal   Place 2 sprays into the nose daily as needed for allergies.         . folic acid (FOLVITE) Q000111Q MCG tablet   Oral   Take 800 mcg by mouth daily.         . hydrochlorothiazide (HYDRODIURIL) 25 MG tablet   Oral   Take 25 mg by mouth daily.         . insulin aspart (NOVOLOG) 100 UNIT/ML injection      Sliding scale NovoLog: Blood sugar less than 120, 0 units. Blood sugar 120-150 give 4 units. Blood sugar 151-200 give 6 units. Blood sugar 201-250 give 8 units. Blood sugar 251-300 give 10 units. Blood sugar 300-350 give 12 units. Blood sugar 351-400 give 15 units. Blood sugar greater than 400 give 20 units and call your doctor.         . insulin glargine (LANTUS) 100 UNIT/ML injection   Subcutaneous   Inject 0.35 mLs (35 Units total) into the skin 2 (two) times daily.         Marland Kitchen lamoTRIgine (LAMICTAL) 100 MG tablet   Oral   Take 200 mg by mouth 2 (two) times daily.          Marland Kitchen levothyroxine (SYNTHROID, LEVOTHROID) 100 MCG tablet   Oral   Take 100 mcg by mouth daily.         Marland Kitchen lisinopril (PRINIVIL,ZESTRIL) 20 MG tablet   Oral   Take 0.5 tablets (10 mg total) by mouth daily.         . mirtazapine (REMERON) 45 MG tablet   Oral   Take 45 mg by mouth at bedtime.         . pravastatin (PRAVACHOL) 80 MG tablet   Oral   Take 80 mg by mouth at bedtime.         Marland Kitchen VITAMIN E PO   Oral   Take 1 tablet by mouth daily.          BP 124/61  Pulse 70  Temp(Src) 97.9 F (36.6 C) (Oral)  Resp 20  Ht 5\' 10"  (1.778 m)  Wt 312 lb (141.522 kg)  BMI 44.77 kg/m2  SpO2 96%  Vital signs normal    Physical Exam  Nursing note and vitals reviewed. Constitutional: She is oriented to person, place, and time. She appears well-developed and well-nourished.  Non-toxic appearance. She does not appear ill. No distress.   Dry tongue  Morbidly obese   HENT:  Head: Normocephalic and atraumatic.  Right Ear: External ear normal.  Left Ear: External ear normal.  Nose: Nose normal. No mucosal edema or rhinorrhea.  Mouth/Throat: Oropharynx is clear and moist and mucous membranes are normal. No dental abscesses or uvula swelling.  Eyes: Conjunctivae and EOM are normal. Pupils are equal, round, and reactive to light.  Neck: Normal range of motion and full passive range of motion without pain. Neck supple.  Cardiovascular: Normal rate, regular rhythm and normal heart sounds.  Exam reveals no gallop and no friction rub.   No murmur heard. No PVCs or PACs seen on monitor during my exam No palpitations noted by patient during my exam   Pulmonary/Chest: Effort normal and breath sounds normal. No respiratory distress. She has no wheezes. She has no rhonchi. She has no rales. She exhibits no tenderness and no crepitus.  Abdominal: Soft. Normal appearance and bowel sounds are normal. She exhibits no distension. There is no tenderness. There is no rebound and no guarding.  Musculoskeletal: Normal range of motion. She exhibits edema. She exhibits no tenderness.  Moves all extremities well.  1 + Pitting edema of lower legs which is chronic per patient  Neurological: She is alert and oriented to person, place, and time. She has normal strength. No cranial nerve deficit.  Skin: Skin is warm, dry and intact. No rash noted. No erythema. No pallor.  Psychiatric: She has a normal mood and affect. Her speech is normal and behavior is normal. Her mood appears not anxious.    ED Course  Procedures (including critical care time)  DIAGNOSTIC STUDIES: Oxygen Saturation is 96% on RA, Adequate by my interpretation.    COORDINATION OF CARE: 7:01 PM- Will order EKG and blood work. Discussed treatment plan with pt at bedside and pt agreed to plan.    Pt reports she has had palpitations while in her room. Monitor does not have any  ectopy or fast heart rate.    Labs Review Results for orders placed during the hospital encounter of 04/07/13  GLUCOSE, CAPILLARY      Result Value Range   Glucose-Capillary 161 (*) 70 - 99 mg/dL  GLUCOSE, CAPILLARY      Result Value Range   Glucose-Capillary 150 (*) 70 - 99 mg/dL   Comment 1 Documented in Chart     Comment 2 Notify RN    COMPREHENSIVE METABOLIC PANEL      Result Value Range   Sodium 134 (*) 135 - 145 mEq/L   Potassium 4.1  3.5 - 5.1 mEq/L   Chloride 94 (*) 96 - 112 mEq/L   CO2 32  19 - 32 mEq/L   Glucose, Bld 152 (*) 70 - 99 mg/dL   BUN 36 (*) 6 - 23 mg/dL   Creatinine, Ser 2.22 (*) 0.50 - 1.10 mg/dL   Calcium 9.7  8.4 - 10.5 mg/dL   Total Protein 7.5  6.0 - 8.3 g/dL   Albumin 3.6  3.5 - 5.2 g/dL   AST 15  0 - 37  U/L   ALT 12  0 - 35 U/L   Alkaline Phosphatase 119 (*) 39 - 117 U/L   Total Bilirubin 0.3  0.3 - 1.2 mg/dL   GFR calc non Af Amer 26 (*) >90 mL/min   GFR calc Af Amer 30 (*) >90 mL/min  CBC WITH DIFFERENTIAL      Result Value Range   WBC 7.1  4.0 - 10.5 K/uL   RBC 4.45  3.87 - 5.11 MIL/uL   Hemoglobin 13.5  12.0 - 15.0 g/dL   HCT 40.1  36.0 - 46.0 %   MCV 90.1  78.0 - 100.0 fL   MCH 30.3  26.0 - 34.0 pg   MCHC 33.7  30.0 - 36.0 g/dL   RDW 12.3  11.5 - 15.5 %   Platelets 275  150 - 400 K/uL   Neutrophils Relative % 52  43 - 77 %   Neutro Abs 3.7  1.7 - 7.7 K/uL   Lymphocytes Relative 35  12 - 46 %   Lymphs Abs 2.5  0.7 - 4.0 K/uL   Monocytes Relative 8  3 - 12 %   Monocytes Absolute 0.6  0.1 - 1.0 K/uL   Eosinophils Relative 4  0 - 5 %   Eosinophils Absolute 0.3  0.0 - 0.7 K/uL   Basophils Relative 1  0 - 1 %   Basophils Absolute 0.1  0.0 - 0.1 K/uL  MAGNESIUM      Result Value Range   Magnesium 1.8  1.5 - 2.5 mg/dL   No results found.   Imaging Review No results found.  EKG Interpretation   None       Date: 04/07/2013  Rate: 72  Rhythm: normal sinus rhythm  QRS Axis: normal  Intervals: normal  ST/T Wave abnormalities:  normal  Conduction Disutrbances:none  Narrative Interpretation:   Old EKG Reviewed: changes noted from 12/26/2012 did have an ectopic atrial rhythm    MDM   1. Palpitations      I personally performed the services described in this documentation, which was scribed in my presence. The recorded information has been reviewed and considered.  Rolland Porter, MD, [facility] Sander     [name], MD 04/07/13 [phone]

## 2013-04-07 NOTE — ED Notes (Signed)
Patient: -DC'd in no apparent distress with RR and effort WDL -ambulatory out of the ED with stable gait noted and companion at side -reported understanding DC instructions -stable vitals noted at DC  

## 2013-04-12 ENCOUNTER — Other Ambulatory Visit: Payer: Self-pay

## 2013-05-26 ENCOUNTER — Encounter (HOSPITAL_COMMUNITY): Payer: Self-pay | Admitting: Emergency Medicine

## 2013-05-26 ENCOUNTER — Emergency Department (HOSPITAL_COMMUNITY)
Admission: EM | Admit: 2013-05-26 | Discharge: 2013-05-26 | Disposition: A | Discharge status: Home / Self Care | Payer: Medicare Other | Attending: Emergency Medicine | Admitting: Emergency Medicine

## 2013-05-26 DIAGNOSIS — Z79899 Other long term (current) drug therapy: Secondary | ICD-10-CM | POA: Insufficient documentation

## 2013-05-26 DIAGNOSIS — F411 Generalized anxiety disorder: Secondary | ICD-10-CM | POA: Insufficient documentation

## 2013-05-26 DIAGNOSIS — E669 Obesity, unspecified: Secondary | ICD-10-CM | POA: Insufficient documentation

## 2013-05-26 DIAGNOSIS — Z87440 Personal history of urinary (tract) infections: Secondary | ICD-10-CM | POA: Insufficient documentation

## 2013-05-26 DIAGNOSIS — F319 Bipolar disorder, unspecified: Secondary | ICD-10-CM | POA: Insufficient documentation

## 2013-05-26 DIAGNOSIS — E1010 Type 1 diabetes mellitus with ketoacidosis without coma: Secondary | ICD-10-CM | POA: Insufficient documentation

## 2013-05-26 DIAGNOSIS — R0602 Shortness of breath: Secondary | ICD-10-CM | POA: Insufficient documentation

## 2013-05-26 DIAGNOSIS — Z3202 Encounter for pregnancy test, result negative: Secondary | ICD-10-CM | POA: Insufficient documentation

## 2013-05-26 DIAGNOSIS — Z7982 Long term (current) use of aspirin: Secondary | ICD-10-CM | POA: Insufficient documentation

## 2013-05-26 DIAGNOSIS — Z9889 Other specified postprocedural states: Secondary | ICD-10-CM | POA: Insufficient documentation

## 2013-05-26 DIAGNOSIS — E7800 Pure hypercholesterolemia, unspecified: Secondary | ICD-10-CM | POA: Insufficient documentation

## 2013-05-26 DIAGNOSIS — K219 Gastro-esophageal reflux disease without esophagitis: Secondary | ICD-10-CM | POA: Insufficient documentation

## 2013-05-26 DIAGNOSIS — I129 Hypertensive chronic kidney disease with stage 1 through stage 4 chronic kidney disease, or unspecified chronic kidney disease: Secondary | ICD-10-CM | POA: Insufficient documentation

## 2013-05-26 DIAGNOSIS — N182 Chronic kidney disease, stage 2 (mild): Secondary | ICD-10-CM | POA: Insufficient documentation

## 2013-05-26 DIAGNOSIS — R739 Hyperglycemia, unspecified: Secondary | ICD-10-CM

## 2013-05-26 DIAGNOSIS — Z794 Long term (current) use of insulin: Secondary | ICD-10-CM | POA: Insufficient documentation

## 2013-05-26 DIAGNOSIS — Z87891 Personal history of nicotine dependence: Secondary | ICD-10-CM | POA: Insufficient documentation

## 2013-05-26 DIAGNOSIS — G4730 Sleep apnea, unspecified: Secondary | ICD-10-CM | POA: Insufficient documentation

## 2013-05-26 DIAGNOSIS — E039 Hypothyroidism, unspecified: Secondary | ICD-10-CM | POA: Insufficient documentation

## 2013-05-26 LAB — URINALYSIS, ROUTINE W REFLEX MICROSCOPIC
Bilirubin Urine: NEGATIVE
Glucose, UA: >1000 mg/dL — AB
Ketones, ur: 40 mg/dL — AB
Specific Gravity, Urine: 1.01 (ref 1.005–1.030)
pH: 6 (ref 5.0–8.0)

## 2013-05-26 LAB — CBC WITH DIFFERENTIAL/PLATELET
Basophils Relative: 1 % (ref 0–1)
HCT: 37.1 % (ref 36.0–46.0)
Hemoglobin: 12.2 g/dL (ref 12.0–15.0)
Lymphs Abs: 1.1 10*3/uL (ref 0.7–4.0)
MCH: 30 pg (ref 26.0–34.0)
MCHC: 32.9 g/dL (ref 30.0–36.0)
Monocytes Absolute: 0.4 10*3/uL (ref 0.1–1.0)
Monocytes Relative: 5 % (ref 3–12)
Neutro Abs: 7 10*3/uL (ref 1.7–7.7)
RBC: 4.07 MIL/uL (ref 3.87–5.11)

## 2013-05-26 LAB — GLUCOSE, CAPILLARY
Glucose-Capillary: 534 mg/dL — ABNORMAL HIGH (ref 70–99)
Glucose-Capillary: 365 mg/dL — ABNORMAL HIGH (ref 70–99)
Glucose-Capillary: 355 mg/dL — ABNORMAL HIGH (ref 70–99)
Glucose-Capillary: 277 mg/dL — ABNORMAL HIGH (ref 70–99)

## 2013-05-26 LAB — BASIC METABOLIC PANEL
BUN: 40 mg/dL — ABNORMAL HIGH (ref 6–23)
CO2: 21 mEq/L (ref 19–32)
Chloride: 94 mEq/L — ABNORMAL LOW (ref 96–112)
Creatinine, Ser: 2.17 mg/dL — ABNORMAL HIGH (ref 0.50–1.10)
GFR calc Af Amer: 31 mL/min — ABNORMAL LOW (ref >90)
Glucose, Bld: 589 mg/dL (ref 70–99)
Potassium: 4.8 mEq/L (ref 3.5–5.1)

## 2013-05-26 LAB — URINE MICROSCOPIC-ADD ON

## 2013-05-26 MED ORDER — SODIUM CHLORIDE 0.9 % IV SOLN: Freq: Once | INTRAVENOUS | Status: DC

## 2013-05-26 MED ORDER — INSULIN ASPART 100 UNIT/ML ~~LOC~~ SOLN
10.0000 [IU] | Freq: Once | SUBCUTANEOUS | Status: AC
  Administered 2013-05-26: 10 [IU] via SUBCUTANEOUS
  Filled 2013-05-26: qty 1

## 2013-05-26 MED ORDER — SODIUM CHLORIDE 0.9 % IV BOLUS (SEPSIS): 2000.0000 mL | Freq: Once | INTRAVENOUS | Status: AC

## 2013-05-26 MED ORDER — INSULIN ASPART 100 UNIT/ML ~~LOC~~ SOLN: 10.0000 [IU] | Freq: Once | SUBCUTANEOUS | Status: DC

## 2013-05-26 NOTE — ED Notes (Signed)
Thinks insulinpump tubing kinked last night. Woke up feeling nauseated and weakness. Alert/oriented. Pt states changed needle and tubing last night. Changed needle again this am and meter still reading high. [place] insulin pump noted to left posterior arm. Pt states she also bolused herself this am, still reading high.

## 2013-05-26 NOTE — ED Provider Notes (Signed)
CSN: [id number]     Arrival date & time 05/26/13  0726 History  This chart was scribed for Lebron Quam, MD by Jenne Campus, ED Scribe. This patient was seen in room APA06/APA06 and the patient's care was started at 7:47 AM.   Chief Complaint  Patient presents with  . Hyperglycemia    The history is provided by the patient. No language interpreter was used.    HPI Comments: Jasmine [name] is a 42 y.o. female who presents to the Emergency Department complaining of waxing and waning hyperglycemia noted at 4 AM this morning. She states that she checked her glucose around 4 AM this morning with a machine reading of HIGH. She reports giving herself a bolus of 35.5 units with an improvement in her glucose level to 570. She states that she then thought that the her pump was malfunctioning which she admits it often does when the tubing becomes kinked. She states that she changed the inlet and the tubing and then rechecked her glucose level at HIGH again causing her concern. She states that at baseline, her glucose levels are uncontrolled due to the malfunctioning pump. When pump is working, CBGs can read between 200 and 300. She reports that her glucose levels yesterday were within the normal range 200 range. She states that the pump is set for 2.9 during the day and "a little less at night". She reports giving herself normal boluses with eating but denies having trouble within the past few days She states that was feeling nauseated and SOB and was having abdominal pain and polydipsia but reports that the symptoms have improved. She states that since the onset she has consumed 64 oz of water with subsequent increased frequency. She denies HA, sore throat, neck pain, CP, cough and dysuria.   Past Medical History  Diagnosis Date  . Hypertension   . Diabetes mellitus   . Bipolar disorder   . CKD (chronic kidney disease), stage II   . Elevated cholesterol   . GERD (gastroesophageal reflux disease)   .  Stress incontinence   . Hypothyroidism   . Sleep apnea     HAS C -PAP / DOES NOT USE  . Anxiety   . Depression   . UTI (lower urinary tract infection)   . Tobacco abuse   . Obesity   . DKA, type 1 11/04/2011   Past Surgical History  Procedure Laterality Date  . Ovary removed    . Nasal fracture surgery    . Uterine fibroid surgery  2001  . Pubovaginal sling  08/16/2011    Procedure: [name];  Surgeon: Bernestine Amass, MD;  Location: [facility];  Service: Urology;  Laterality: N/A;         . Incontinence surgery     Family History  Problem Relation Age of Onset  . Asthma Mother   . Heart disease Father   . Cancer Paternal Grandmother     lung and breast  . Bladder Cancer Paternal Grandfather    History  Substance Use Topics  . Smoking status: Former Smoker -- 0.50 packs/day for 20 years    Types: Cigarettes  . Smokeless tobacco: Not on file  . Alcohol Use: No   No OB history provided.  Review of Systems  Constitutional: Negative for fever, chills, diaphoresis, appetite change and fatigue.  HENT: Negative for mouth sores, sore throat and trouble swallowing.   Eyes: Negative for visual disturbance.  Respiratory: Positive for shortness of breath. Negative  for cough, chest tightness and wheezing.   Cardiovascular: Negative for chest pain.  Gastrointestinal: Positive for nausea (resolved) and abdominal pain (resolved). Negative for vomiting, diarrhea and abdominal distention.  Endocrine: Positive for polydipsia and polyuria. Negative for polyphagia.  Genitourinary: Negative for dysuria and hematuria.  Musculoskeletal: Negative for gait problem.  Skin: Negative for color change, pallor and rash.  Neurological: Negative for dizziness, syncope, light-headedness and headaches.  Hematological: Does not bruise/bleed easily.  Psychiatric/Behavioral: Negative for behavioral problems and confusion.    Allergies  Advair diskus and Biaxin  Home Medications   Current  Outpatient Rx  Name  Route  Sig  Dispense  Refill  . albuterol (PROVENTIL HFA;VENTOLIN HFA) 108 (90 BASE) MCG/ACT inhaler   Inhalation   Inhale 2 puffs into the lungs every 6 (six) hours as needed for wheezing.         Marland Kitchen aspirin EC 81 MG tablet   Oral   Take 81 mg by mouth daily.         . carvedilol (COREG) 25 MG tablet   Oral   Take 37.5 mg by mouth 2 (two) times daily with a meal.          . cholecalciferol (VITAMIN D) 1000 UNITS tablet   Oral   Take 1,000 Units by mouth daily.         . clonazePAM (KLONOPIN) 0.5 MG tablet   Oral   Take 0.5 mg by mouth at bedtime.         . fish oil-omega-3 fatty acids 1000 MG capsule   Oral   Take 1 g by mouth daily.         . folic acid (FOLVITE) Q000111Q MCG tablet   Oral   Take 800 mcg by mouth daily.         . furosemide (LASIX) 40 MG tablet   Oral   Take 40 mg by mouth every morning.         . insulin aspart (NOVOLOG) 100 UNIT/ML injection   Subcutaneous   Inject into the skin continuous. Daily Bolus of 2.9 units every hour         . lamoTRIgine (LAMICTAL) 100 MG tablet   Oral   Take 200 mg by mouth every morning.          Marland Kitchen levothyroxine (SYNTHROID, LEVOTHROID) 112 MCG tablet   Oral   Take 112 mcg by mouth every morning.         . mirtazapine (REMERON) 45 MG tablet   Oral   Take 45 mg by mouth at bedtime.         . pravastatin (PRAVACHOL) 80 MG tablet   Oral   Take 80 mg by mouth at bedtime.         . ranitidine (ZANTAC) 150 MG tablet   Oral   Take 150 mg by mouth at bedtime.         Marland Kitchen acetaminophen (TYLENOL) 500 MG tablet   Oral   Take 1,000 mg by mouth every 6 (six) hours as needed. For pain         . fluticasone (FLONASE) 50 MCG/ACT nasal spray   Nasal   Place 2 sprays into the nose daily as needed for allergies.          Triage Vitals: BP 120/60  Pulse 83  Temp(Src) 98.2 F (36.8 C) (Oral)  Resp 18  Ht 5\' 10"  (1.778 m)  Wt 300 lb (136.079 kg)  BMI 43.05 kg/m2  SpO2  91%  Physical Exam  Nursing note and vitals reviewed. Constitutional: She is oriented to person, place, and time. She appears well-developed and well-nourished. No distress.  HENT:  Head: Normocephalic and atraumatic.  Mouth/Throat: Oropharynx is clear and moist.  Dry MM  Eyes: Conjunctivae are normal. Pupils are equal, round, and reactive to light. No scleral icterus.  Neck: Normal range of motion. Neck supple. No thyromegaly present.  Cardiovascular: Normal rate and regular rhythm.  Exam reveals no gallop and no friction rub.   No murmur heard. Pulmonary/Chest: Effort normal and breath sounds normal. No respiratory distress. She has no wheezes. She has no rales.  Abdominal: Soft. Bowel sounds are normal. She exhibits no distension. There is no tenderness. There is no rebound.  Musculoskeletal: Normal range of motion.  Neurological: She is alert and oriented to person, place, and time.  Skin: Skin is warm and dry. No rash noted.  Psychiatric: She has a normal mood and affect. Her behavior is normal.    ED Course  Procedures (including critical care time)  Medications  sodium chloride 0.9 % bolus 2,000 mL (1,000 mLs Intravenous Given by EMS 05/26/13 0838)  insulin aspart (novoLOG) injection 10 Units (10 Units Subcutaneous Given 05/26/13 0838)    DIAGNOSTIC STUDIES: Oxygen Saturation is 91% on RA, adequate by my interpretation.    COORDINATION OF CARE: 7:50 AM-Discussed treatment plan which includes CBC panel, BMP and UA with pt at bedside and pt agreed to plan.   Labs Review Labs Reviewed  CBC WITH DIFFERENTIAL - Abnormal; Notable for the following:    Neutrophils Relative % 80 (*)    All other components within normal limits  BASIC METABOLIC PANEL - Abnormal; Notable for the following:    Sodium 128 (*)    Chloride 94 (*)    Glucose, Bld 589 (*)    BUN 40 (*)    Creatinine, Ser 2.17 (*)    GFR calc non Af Amer 27 (*)    GFR calc Af Amer 31 (*)    All other components  within normal limits  URINALYSIS, ROUTINE W REFLEX MICROSCOPIC - Abnormal; Notable for the following:    Glucose, UA >1000 (*)    Hgb urine dipstick TRACE (*)    Ketones, ur 40 (*)    All other components within normal limits  GLUCOSE, CAPILLARY - Abnormal; Notable for the following:    Glucose-Capillary 534 (*)    All other components within normal limits  GLUCOSE, CAPILLARY - Abnormal; Notable for the following:    Glucose-Capillary 365 (*)    All other components within normal limits  GLUCOSE, CAPILLARY - Abnormal; Notable for the following:    Glucose-Capillary 355 (*)    All other components within normal limits  GLUCOSE, CAPILLARY - Abnormal; Notable for the following:    Glucose-Capillary 277 (*)    All other components within normal limits  PREGNANCY, URINE  URINE MICROSCOPIC-ADD ON   Imaging Review No results found.  EKG Interpretation   None       MDM   1. Hyperglycemia    patient initially hypoglycemic. Not acidotic. Given IV fluids and subcutaneous insulin her sugar slowly improve. I recheck most recent was 270. She is quite comfortable handling this at home with her pump. Chief finds her pump to be functioning normally here after changing the needle and tubing.    I personally performed the services described in this documentation, which was scribed in my presence. The recorded information has  been reviewed and is accurate.    Lebron Quam, MD 05/26/13 1248

## 2013-05-26 NOTE — ED Notes (Signed)
CRITICAL VALUE ALERT  Critical value received:  Glucose 589  Date of notification:  05/26/13  Time of notification:  0832  Critical value read back:yes  Nurse who received alert:  c Kemp Gomes RN  MD notified (1st page):    Time of first page:    MD notified (2nd page):  Time of second page:  Responding MD:  Dr Jeneen Rinks  Time MD responded:  [phone]

## 2013-05-26 NOTE — ED Notes (Signed)
edp aware of bs

## 2013-05-26 NOTE — ED Notes (Signed)
Pt drinking water 

## 2013-05-29 IMAGING — CT CT CHEST W/O CM
2 of 3 series · 15 of 36 positions shown, 18 images · IV contrast (Omnipaque 300)
Comparison: 05/01/2012

CLINICAL DATA: Evaluate pulmonary infiltrates.  Question sarcoid.
Question pulmonary nodule.

CT CHEST WITHOUT CONTRAST
TECHNIQUE: Multidetector CT imaging of the chest was performed
following the standard protocol without IV contrast.

[Series 2: chest routine with · axial · 0.85mm/px · z∈[-322,-92]mm · 12 of 54 slices shown, 15 images]
[im 4/54  mediastinal]
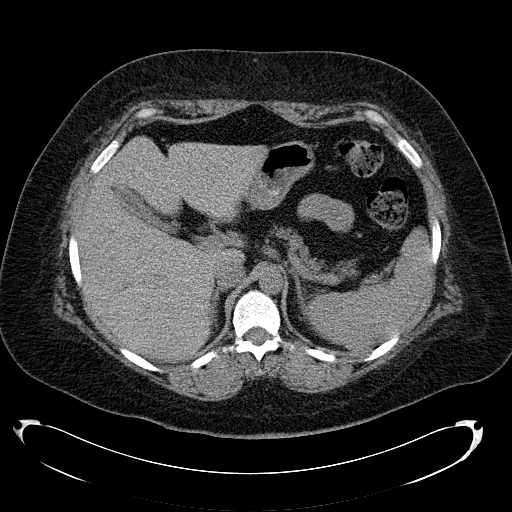
[im 4/54  lung]
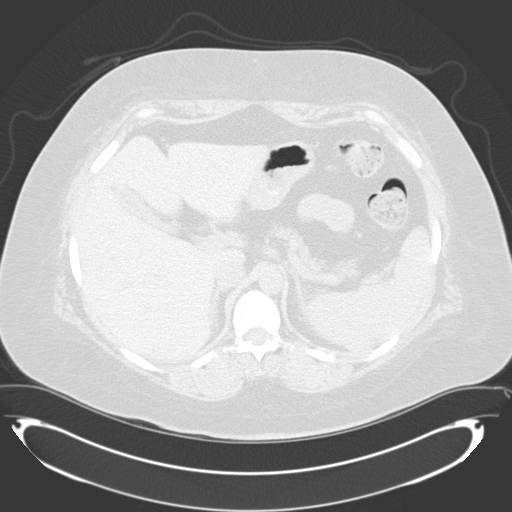
[im 8/54  lung]
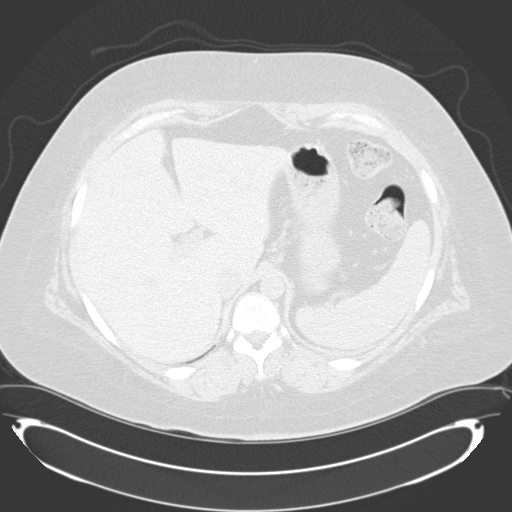
[im 12/54  lung]
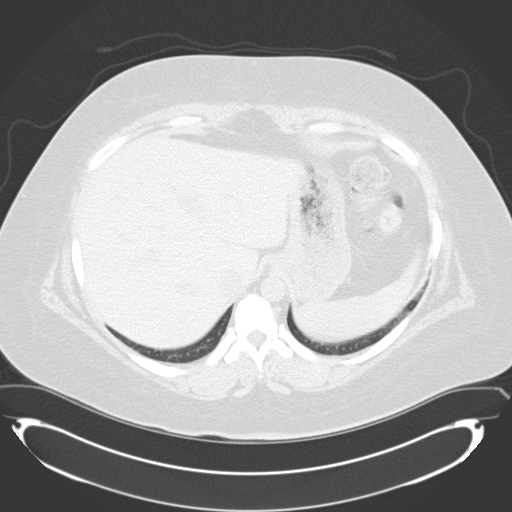
[im 16/54  lung]
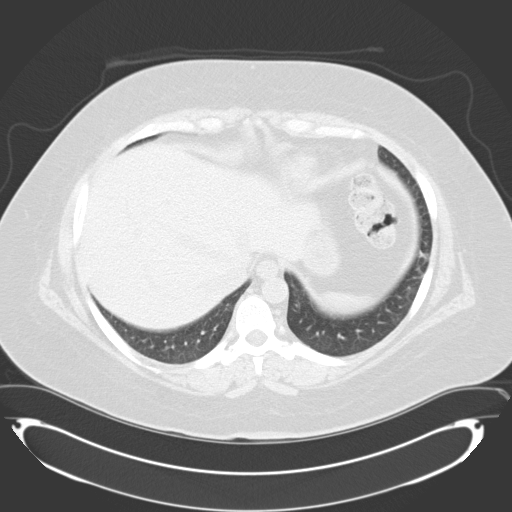
[im 20/54  mediastinal]
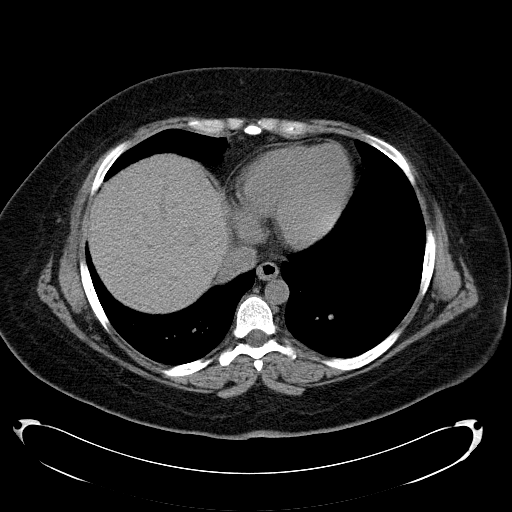
[im 20/54  lung]
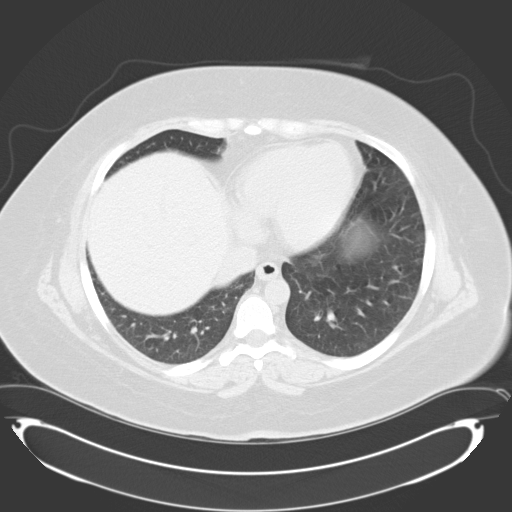
[im 24/54  lung]
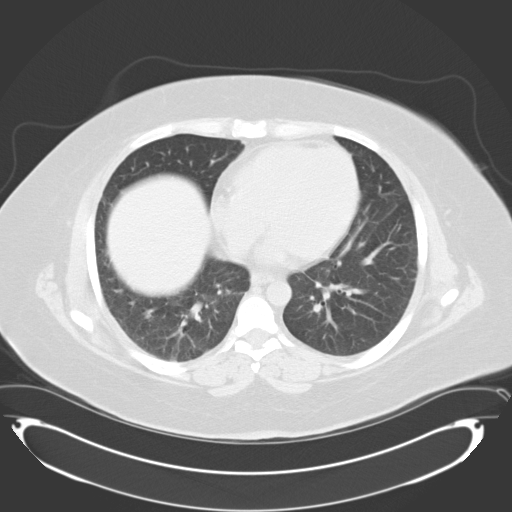
[im 30/54  lung]
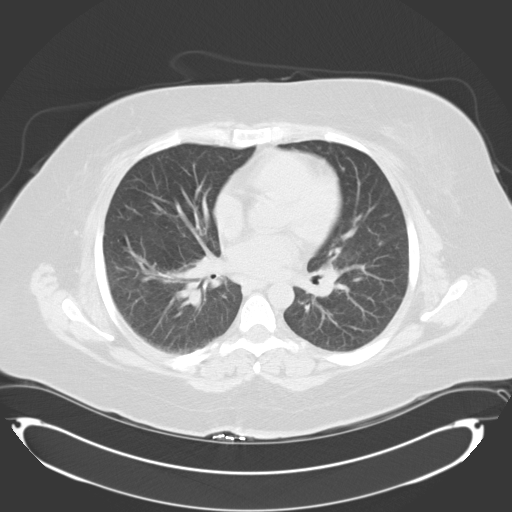
[im 34/54  lung]
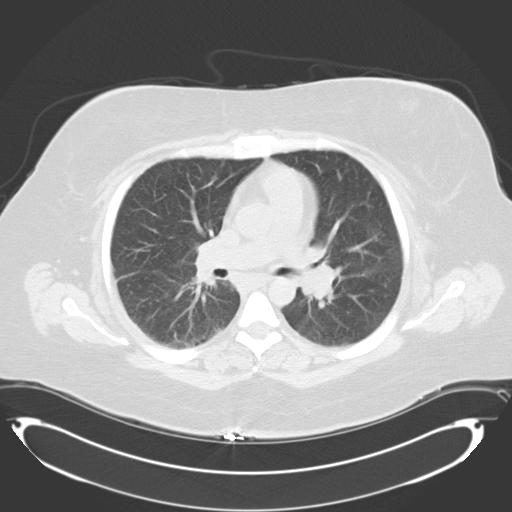
[im 38/54  mediastinal]
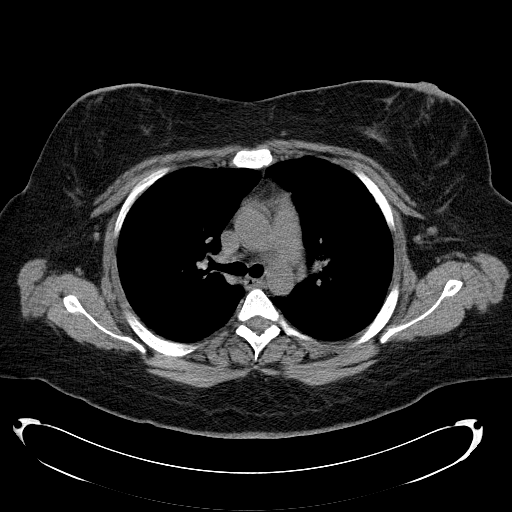
[im 38/54  lung]
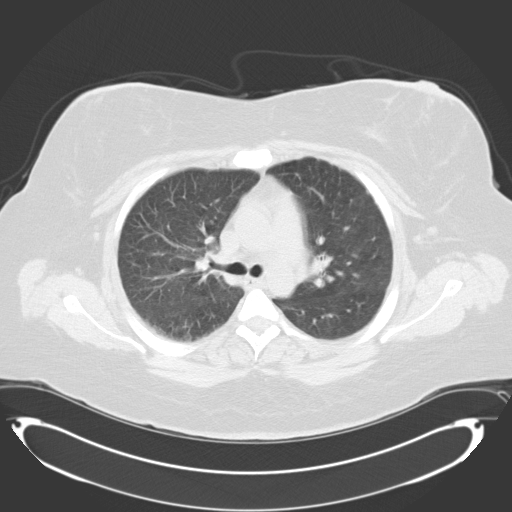
[im 42/54  lung]
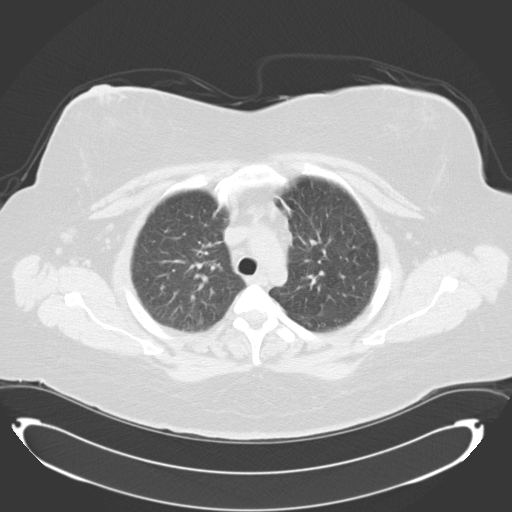
[im 46/54  lung]
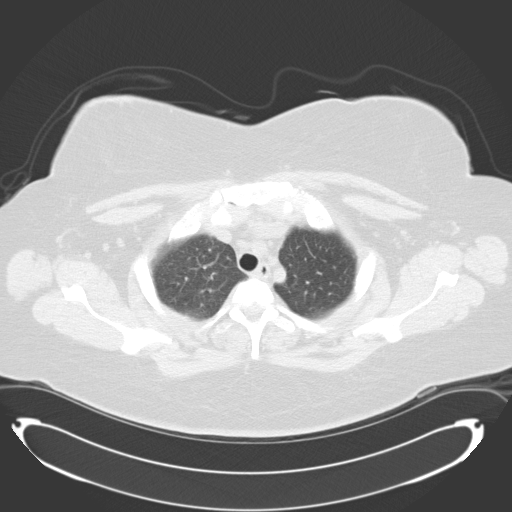
[im 50/54  lung]
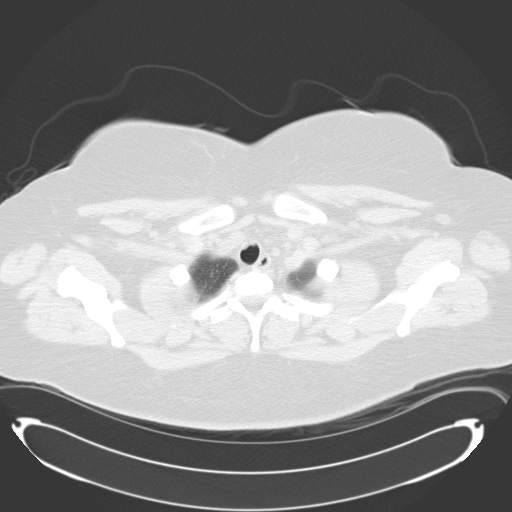

[Series 602: cor · coronal · 0.85mm/px · 3 of 114 slices shown]
[im 23/114  lung]
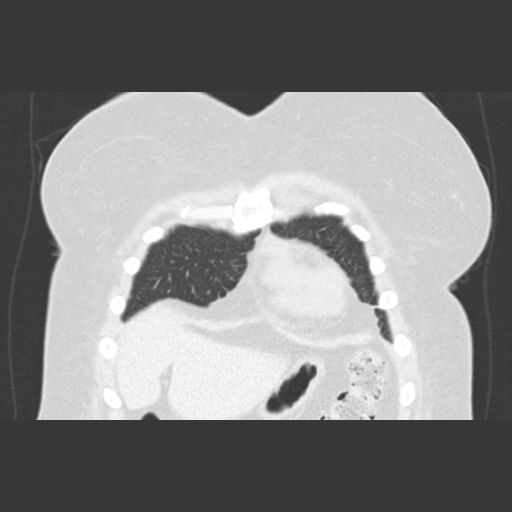
[im 46/114  lung]
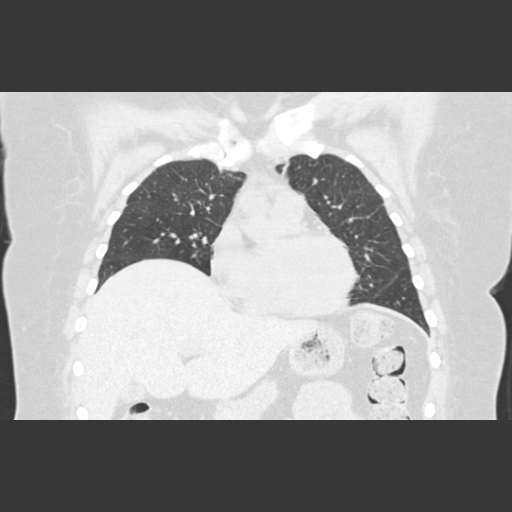
[im 68/114  lung]
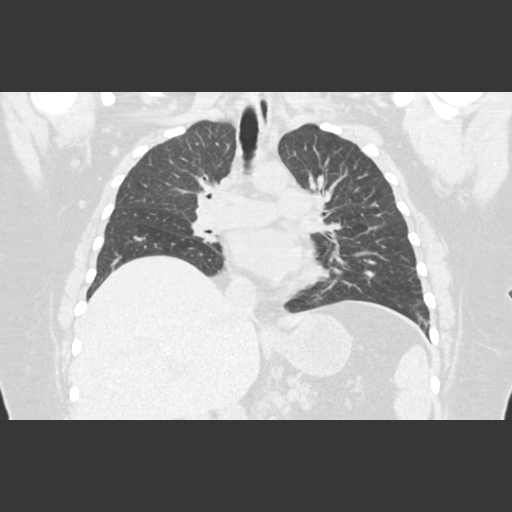

[15 of 36 positions shown; findings below may reference images not displayed]

FINDINGS: Lungs/pleura: No pleural effusion identified.  Previously
noted upper lobe predominant interstitial accentuation appears
improved from previous exam.  There are areas of scarring within
the medial right upper lobes.  No pulmonary nodularity or airspace
consolidation noted.

Heart/Mediastinum: The heart size is normal.  No pericardial
effusion.  9 mm prevascular lymph node is identified, image
15/series 2.  This is unchanged from previous exam.  The subcarinal
lymph node measures 1 cm, image 23/series 2.  Previously this
measured 1.3 cm.  Right paratracheal lymph node measures 0.7 cm,
image 14/series 2.  Previously this measured 1 cm.  No significant
hilar adenopathy.

Upper abdomen: Again noted is bilateral perirenal fat stranding.
No acute findings noted within the upper abdomen.

Bones/Musculoskeletal:  Review of the visualized bony structures is
unremarkable.
IMPRESSION: 1.  No acute findings identified within the abdomen or pelvis.
2.  Interval improvement and upper lobe predominant interstitial
accentuation.
3.  Decrease in size of mediastinal lymph nodes.

## 2013-08-14 ENCOUNTER — Telehealth: Payer: Self-pay | Admitting: Internal Medicine

## 2013-08-14 NOTE — Telephone Encounter (Signed)
Notified pt of ct appt 10/03/13@3 :00pm Jasmine [name]

## 2013-10-03 ENCOUNTER — Ambulatory Visit (INDEPENDENT_AMBULATORY_CARE_PROVIDER_SITE_OTHER)
Admission: RE | Admit: 2013-10-03 | Discharge: 2013-10-03 | Disposition: A | Discharge status: Home / Self Care | Payer: BC Managed Care – PPO | Source: Ambulatory Visit | Attending: Internal Medicine | Admitting: Internal Medicine

## 2013-10-03 DIAGNOSIS — R918 Other nonspecific abnormal finding of lung field: Secondary | ICD-10-CM

## 2013-10-04 ENCOUNTER — Telehealth: Payer: Self-pay | Admitting: Internal Medicine

## 2013-10-04 NOTE — Telephone Encounter (Signed)
Ct Chest Wo Contrast  10/03/2013   CLINICAL DATA:  One year followup evaluation for pulmonary infiltrates.  EXAM: CT CHEST WITHOUT CONTRAST  TECHNIQUE: Multidetector CT imaging of the chest was performed following the standard protocol without IV contrast.  COMPARISON:  Chest CT 09/20/2012.  FINDINGS: Mediastinum: Heart size is normal. There is no significant pericardial fluid, thickening or pericardial calcification. No pathologically enlarged mediastinal or hilar lymph nodes. Please note that accurate exclusion of hilar adenopathy is limited on noncontrast CT scans. Esophagus is unremarkable in appearance.  Lungs/Pleura: No consolidative airspace disease. No pleural effusions. No suspicious appearing pulmonary nodules or masses. Linear opacities in the right upper lobe and right lower lobe are unchanged compared to the prior examination, compatible with mild scarring (presumably post infectious/inflammatory).  Upper Abdomen: Unremarkable.  Musculoskeletal: There are no aggressive appearing lytic or blastic lesions noted in the visualized portions of the skeleton.  IMPRESSION: 1. No acute findings in the thorax. 2. Mild scarring in the right lung, unchanged compared to prior study 09/20/2012.   Electronically Signed   By: Vinnie Langton M.D.   On: 10/03/2013 16:19    CT chest essentially clear compared to last year except mild scarring RUL from pneumonia admission in past.  Have her come in first avail to discuss    Dr. Brand Males, M.D., [facility].C.P Pulmonary and Critical Care Medicine Staff Physician [facility] Pager: [phone], If no answer or between  15:00h - 7:00h: call [phone]  10/04/2013 10:12 PM

## 2013-10-08 ENCOUNTER — Telehealth: Payer: Self-pay | Admitting: Internal Medicine

## 2013-10-08 NOTE — Telephone Encounter (Signed)
CT chest essentially clear compared to last year except mild scarring RUL from pneumonia admission in past.  Have her come in first avail to discuss    Spoke with the pt and notified of recs per MR She verbalized understanding  OV with MR scheduled for 10/25/13

## 2013-10-08 NOTE — Telephone Encounter (Signed)
Spoke with pt and notified of results per Dr. Ramaswamy. Pt verbalized understanding and denied any questions.  

## 2013-10-25 ENCOUNTER — Ambulatory Visit (INDEPENDENT_AMBULATORY_CARE_PROVIDER_SITE_OTHER): Payer: BC Managed Care – PPO | Admitting: Internal Medicine

## 2013-10-25 ENCOUNTER — Encounter: Payer: Self-pay | Admitting: Internal Medicine

## 2013-10-25 VITALS — BP 128/86 | HR 78 | Temp 98.4°F | Ht 69.5 in | Wt 318.4 lb

## 2013-10-25 DIAGNOSIS — F17200 Nicotine dependence, unspecified, uncomplicated: Secondary | ICD-10-CM

## 2013-10-25 DIAGNOSIS — Z720 Tobacco use: Secondary | ICD-10-CM

## 2013-10-25 DIAGNOSIS — R0982 Postnasal drip: Secondary | ICD-10-CM

## 2013-10-25 DIAGNOSIS — R918 Other nonspecific abnormal finding of lung field: Secondary | ICD-10-CM

## 2013-10-25 NOTE — Assessment & Plan Note (Signed)
Congratulated on quitting smoking April 2014 following admission for diabetes issues

## 2013-10-25 NOTE — Progress Notes (Signed)
Subjective:    Patient ID: Jasmine [name], female    DOB: 1970/06/17, 43 y.o.   MRN: [id number]  HPI PCP is Tula Nakayama   IOV 08/14/2012  43 year old female.  Smoker. Body mass index is 43.59 kg/(m^2). Type 1 Diabetic on insulin pump. OSA on CPAP [apnea hypotony index of 19.9 on sleep study 04/27/2012. BiPAP titration and 18/14 showed most benefit of his history of noncompliance]. Bipolar (normally takes father for medical visits to help understand what is going on)   Reports that in oct 2013: went to MD due to knee pain. PMD heard her wheeze. CXR showed abnormality. Referred to Dr Feliz Beam (pulmonary at [facility]) who initially said asthmatic bronchitis. Had PFTs which patient recollects as normal. This was followed by CT chest Oct ([facility]) and Nov 2013 ([facility]). Per review of outside records a bronchoscopy and transbronchial biopsies was considered as the first step but due to concern of potential lymphoma in the differential diagnosis she was referred to thoracic surgery at [facility]. Referred to [facility] to Dr Hilda Blades. In Jan 2014: was due to have what sounds like VATS robotic lung biopsy by Dr Lianne Moris but due to high sugars kept in ICU and procedure canceled. . Of note only test she recolets is ESR 41 by PMD.  Review of outside records obtained in my hands after patient left the office shows differential diagnosis of sarcoidosis versus hypersensitivity pneumonitis. Lymphoma was entertained by the patient.   Patient currently self admittedly overwhelmed by above and difficulty concentrating and grasping even at baseline due to health issues. She is now seeing second opinion. She is upset with Dr Feliz Beam esp after he labeled as her type 2 DM.  Also, she is very SCARED about having open biopsy due to risks and what she perceives as poor explanation.   Currently  Cough x 1 week but denies chronic wheeze or dyspnea or chest pains or cough or rash. Only  systemic issue is wrist pain OA due to typing. She is on symbicort which she does not see as heplping   Of note, I only noted that after she left the office and gave blood work at our lab that her ANA, and, CBC, ESR, fungal antibodies, IgE, hypersensitivity pneumonitis panel, serum ACE level were actually normal on 04/07/2012 at [facility]  Pulmonary function testing dated 05/01/2012 FEV1 2.8 L/82%. FVC 2.3 L/79%. Total lung capacity 4.75 L 82%. DLCO of 18.7 per/55%  Exposure   reports that she has been smoking Cigarettes.  She has a 20 pack-year smoking history. She does not have any smokeless tobacco history on file.   CT chest 03/28/12  CT CHEST WITHOUT CONTRAST  Technique: Multidetector CT imaging of the chest was performed  following the standard protocol without IV contrast.  Comparison: Plain films of 11/03/2011 and 06/17/2011. No prior  CTs.  Findings: Lung windows demonstrate peribronchovascular nodularity  which is slightly greater on the right. The well-defined nodule is  in the peribronchovascular left upper lobe at 9 mm on image 14. No  definite cranial caudal gradient. 18. Thickening of the right  major fissure secondary to nodularity on image 23.  Soft tissue windows demonstrate calcified right-sided thyroid  nodule which measures 6 mm and is nonspecific. No axillary  adenopathy. Heart size upper normal, without pericardial or  pleural effusion.  Right paratracheal 1.2 cm node on image 16.  Subcarinal/azygo-esophageal recess 1.9 cm node on image 25.  Hilar  regions are poorly evaluated without contrast. There is however,  bilateral soft tissue fullness, most consistent with adenopathy.  Prevascular node measures 1.4 cm on image 17.  Limited abdominal imaging demonstrates no significant findings. No  acute osseous abnormality. Deformity of the sternal body is likely  related to remote trauma.  IMPRESSION:  Thoracic adenopathy with  concurrent diffuse peribronchovascular  nodularity. Favor sarcoidosis. Other granulomatous processes,  such as atypical/mycobacterial infection are felt less likely.  Lymphoproliferative process could cause the thoracic adenopathy,  but would not typically cause the pulmonary parenchymal findings.  CT followup could be performed at 3 months to confirm stability of  the adenopathy.  Original Report Authenticated By: Areta Haber, M.D   REC Differential diagnosis appears to be sarcoidosis or hypersensitivity pneumonitis. Lymphomas lower the differential diagnosis. I ordered autoimmune lab tests and ACE level based on this patient's history that these blood tests were never done. However outside records after we did blood draw indicates that these tests were done and they were negative. Assuming that it will be negative even at our lab, I will have our local radiologist compare her 2 scans. After which I will advise patient the likely next step which will involve bronchoscopy with lavage/transbronchial biopsies with endobronchial ultrasound biopsy    OV 10/02/2012 Followup for pulmonary infiltrates, smoking and postnasal drip  - Do of the pulmonary infiltrate: She underwent autoimmune test, ACE level test, hypersensitivity pneumonitis panel. All this is negative. She at followup CT scan of the chest 09/20/2012: Does show significant clearing of infiltrates and improvement in her mediastinal nodes.  - In terms of smoking: She was admitted last week for her diabetes and this prompted her to quit smoking. Currently smoking is in remission  - Current active problem: Is mild postnasal drainage and related cough which she feels is fairly under control except last week it flared up and she took Zyrtec   Past, Family, Social reviewed: no change since last visit     OV 10/25/2013  Followup for pulmonary infiltrates, smoking and postnasal drip  ILD  : assymptomatic. Fu CT April 2015: shows lack  of ILD except mild ggo RUL but this is stable too compared to a year ago. She is not inclined for any active Ct based followup. Wants to follow clinically only   Tobacco :  reports that she has quit smoking. Her smoking use included Cigarettes. She has a 10 pack-year smoking history. She does not have any smokeless tobacco history on file.   Chronic cough and sinus drainge: active in pollen season, moderate but otherwise ok. She will manage herself with PCP KAPLAN,KRISTEN, PA-C on this   Review of Systems  Constitutional: Negative for fever and unexpected weight change.  HENT: Positive for postnasal drip. Negative for congestion, dental problem, ear pain, nosebleeds, rhinorrhea, sinus pressure, sneezing, sore throat and trouble swallowing.   Eyes: Negative for redness and itching.  Respiratory: Positive for cough. Negative for chest tightness, shortness of breath and wheezing.   Cardiovascular: Negative for palpitations and leg swelling.  Gastrointestinal: Negative for nausea and vomiting.  Genitourinary: Negative for dysuria.  Musculoskeletal: Negative for joint swelling.  Skin: Negative for rash.  Neurological: Negative for headaches.  Hematological: Does not bruise/bleed easily.  Psychiatric/Behavioral: Negative for dysphoric mood. The patient is not nervous/anxious.        Objective:   Physical Exam  Vitals reviewed. Constitutional: She is oriented to person, place, and time. She appears well-developed and well-nourished. No distress.  Body mass index is 46.36 kg/(m^2).   HENT:  Head: Normocephalic and atraumatic.  Right Ear: External ear normal.  Left Ear: External ear normal.  Mouth/Throat: Oropharynx is clear and moist. No oropharyngeal exudate.  Eyes: Conjunctivae and EOM are normal. Pupils are equal, round, and reactive to light. Right eye exhibits no discharge. Left eye exhibits no discharge. No scleral icterus.  Neck: Normal range of motion. Neck supple. No JVD  present. No tracheal deviation present. No thyromegaly present.  Cardiovascular: Normal rate, regular rhythm, normal heart sounds and intact distal pulses.  Exam reveals no gallop and no friction rub.   No murmur heard. Pulmonary/Chest: Effort normal and breath sounds normal. No respiratory distress. She has no wheezes. She has no rales. She exhibits no tenderness.  Abdominal: Soft. Bowel sounds are normal. She exhibits no distension and no mass. There is no tenderness. There is no rebound and no guarding.  Musculoskeletal: Normal range of motion. She exhibits no edema and no tenderness.  Lymphadenopathy:    She has no cervical adenopathy.  Neurological: She is alert and oriented to person, place, and time. She has normal reflexes. No cranial nerve deficit. She exhibits normal muscle tone. Coordination normal.  Skin: Skin is warm and dry. No rash noted. She is not diaphoretic. No erythema. No pallor.  Psychiatric: She has a normal mood and affect. Her behavior is normal. Judgment and thought content normal.          Assessment & Plan:

## 2013-10-25 NOTE — Patient Instructions (Addendum)
#  ILD/ Pulmonary infiltrates on CT scan October 2013 - This is almost cleared up on CT scan of the chest April 2014 and remains clear April 2015 except for small patch Right upper lobe - We discussed and have elected to follow expectantly   #Postnasal drip and related cough - Continue nasal steroid and Zyrtec as needed as before; but limit zyrtec to rare use only  #Smoking - Congratulations on quitting smoking and remaining quit  #Followup - as needed

## 2013-10-25 NOTE — Assessment & Plan Note (Signed)
#  ILD/ Pulmonary infiltrates on CT scan October 2013 - This is almost cleared up on CT scan of the chest April 2014 and remains clear April 2015 except for small patch Right upper lobe - In retrospect this was RB-ILD or viral infection - We discussed and have elected to follow expectantly (she is not inclined to do repeat ct)

## 2013-10-25 NOTE — Assessment & Plan Note (Signed)
#  Postnasal drip and related cough - Continue nasal steroid and Zyrtec as needed as before; but limit zyrtec to rare use only  #Followup - as needed

## 2015-03-19 ENCOUNTER — Encounter (HOSPITAL_COMMUNITY): Payer: Self-pay | Admitting: Emergency Medicine

## 2015-03-19 ENCOUNTER — Emergency Department (HOSPITAL_COMMUNITY)
Admission: EM | Admit: 2015-03-19 | Discharge: 2015-03-19 | Disposition: A | Discharge status: Home / Self Care | Payer: BLUE CROSS/BLUE SHIELD | Attending: Emergency Medicine | Admitting: Emergency Medicine

## 2015-03-19 DIAGNOSIS — Z87440 Personal history of urinary (tract) infections: Secondary | ICD-10-CM | POA: Diagnosis not present

## 2015-03-19 DIAGNOSIS — E1010 Type 1 diabetes mellitus with ketoacidosis without coma: Secondary | ICD-10-CM | POA: Diagnosis not present

## 2015-03-19 DIAGNOSIS — F329 Major depressive disorder, single episode, unspecified: Secondary | ICD-10-CM | POA: Insufficient documentation

## 2015-03-19 DIAGNOSIS — Z7982 Long term (current) use of aspirin: Secondary | ICD-10-CM | POA: Insufficient documentation

## 2015-03-19 DIAGNOSIS — R197 Diarrhea, unspecified: Secondary | ICD-10-CM | POA: Insufficient documentation

## 2015-03-19 DIAGNOSIS — N182 Chronic kidney disease, stage 2 (mild): Secondary | ICD-10-CM | POA: Diagnosis not present

## 2015-03-19 DIAGNOSIS — G4730 Sleep apnea, unspecified: Secondary | ICD-10-CM | POA: Diagnosis not present

## 2015-03-19 DIAGNOSIS — Z9981 Dependence on supplemental oxygen: Secondary | ICD-10-CM | POA: Insufficient documentation

## 2015-03-19 DIAGNOSIS — Z87448 Personal history of other diseases of urinary system: Secondary | ICD-10-CM | POA: Diagnosis not present

## 2015-03-19 DIAGNOSIS — I129 Hypertensive chronic kidney disease with stage 1 through stage 4 chronic kidney disease, or unspecified chronic kidney disease: Secondary | ICD-10-CM | POA: Insufficient documentation

## 2015-03-19 DIAGNOSIS — E7800 Pure hypercholesterolemia, unspecified: Secondary | ICD-10-CM | POA: Insufficient documentation

## 2015-03-19 DIAGNOSIS — K219 Gastro-esophageal reflux disease without esophagitis: Secondary | ICD-10-CM | POA: Insufficient documentation

## 2015-03-19 DIAGNOSIS — E039 Hypothyroidism, unspecified: Secondary | ICD-10-CM | POA: Diagnosis not present

## 2015-03-19 DIAGNOSIS — E669 Obesity, unspecified: Secondary | ICD-10-CM | POA: Insufficient documentation

## 2015-03-19 DIAGNOSIS — Z794 Long term (current) use of insulin: Secondary | ICD-10-CM | POA: Insufficient documentation

## 2015-03-19 DIAGNOSIS — Z87891 Personal history of nicotine dependence: Secondary | ICD-10-CM | POA: Diagnosis not present

## 2015-03-19 DIAGNOSIS — F419 Anxiety disorder, unspecified: Secondary | ICD-10-CM | POA: Insufficient documentation

## 2015-03-19 DIAGNOSIS — Z79899 Other long term (current) drug therapy: Secondary | ICD-10-CM | POA: Insufficient documentation

## 2015-03-19 DIAGNOSIS — R112 Nausea with vomiting, unspecified: Secondary | ICD-10-CM | POA: Insufficient documentation

## 2015-03-19 DIAGNOSIS — R1084 Generalized abdominal pain: Secondary | ICD-10-CM | POA: Diagnosis present

## 2015-03-19 LAB — CBC
HCT: 40.3 % (ref 36.0–46.0)
Hemoglobin: 13.6 g/dL (ref 12.0–15.0)
MCH: 30.6 pg (ref 26.0–34.0)
MCHC: 33.7 g/dL (ref 30.0–36.0)
MCV: 90.6 fL (ref 78.0–100.0)
Platelets: 235 10*3/uL (ref 150–400)
RBC: 4.45 MIL/uL (ref 3.87–5.11)
RDW: 12.6 % (ref 11.5–15.5)
WBC: 5.9 10*3/uL (ref 4.0–10.5)

## 2015-03-19 LAB — COMPREHENSIVE METABOLIC PANEL
ALK PHOS: 89 U/L (ref 38–126)
ALT: 22 U/L (ref 14–54)
AST: 24 U/L (ref 15–41)
Albumin: 3.5 g/dL (ref 3.5–5.0)
Anion gap: 8 (ref 5–15)
BUN: 24 mg/dL — AB (ref 6–20)
CALCIUM: 8.6 mg/dL — AB (ref 8.9–10.3)
CHLORIDE: 101 mmol/L (ref 101–111)
CO2: 26 mmol/L (ref 22–32)
CREATININE: 1.87 mg/dL — AB (ref 0.44–1.00)
GFR calc Af Amer: 37 mL/min — ABNORMAL LOW (ref >60)
GFR calc non Af Amer: 32 mL/min — ABNORMAL LOW (ref >60)
Glucose, Bld: 139 mg/dL — ABNORMAL HIGH (ref 65–99)
Potassium: 4.3 mmol/L (ref 3.5–5.1)
SODIUM: 135 mmol/L (ref 135–145)
TOTAL PROTEIN: 6.7 g/dL (ref 6.5–8.1)
Total Bilirubin: 0.4 mg/dL (ref 0.3–1.2)

## 2015-03-19 LAB — URINALYSIS, ROUTINE W REFLEX MICROSCOPIC
BILIRUBIN URINE: NEGATIVE
Glucose, UA: NEGATIVE mg/dL
Hgb urine dipstick: NEGATIVE
KETONES UR: NEGATIVE mg/dL
LEUKOCYTES UA: NEGATIVE
Nitrite: NEGATIVE
PH: 6.5 (ref 5.0–8.0)
PROTEIN: NEGATIVE mg/dL
Specific Gravity, Urine: 1.01 (ref 1.005–1.030)
UROBILINOGEN UA: 0.2 mg/dL (ref 0.0–1.0)

## 2015-03-19 LAB — CBG MONITORING, ED: GLUCOSE-CAPILLARY: 142 mg/dL — AB (ref 65–99)

## 2015-03-19 LAB — LIPASE, BLOOD: LIPASE: 21 U/L — AB (ref 22–51)

## 2015-03-19 MED ORDER — SODIUM CHLORIDE 0.9 % IV BOLUS (SEPSIS)
1000.0000 mL | Freq: Once | INTRAVENOUS | Status: AC
  Administered 2015-03-19: 1000 mL via INTRAVENOUS

## 2015-03-19 MED ORDER — ONDANSETRON HCL 4 MG/2ML IJ SOLN
4.0000 mg | Freq: Once | INTRAMUSCULAR | Status: AC
  Administered 2015-03-19: 4 mg via INTRAVENOUS
  Filled 2015-03-19: qty 2

## 2015-03-19 NOTE — ED Provider Notes (Signed)
CSN: [id number]     Arrival date & time 03/19/15  1255 History   First MD Initiated Contact with Patient 03/19/15 1500     Chief Complaint  Patient presents with  . Abdominal Pain     (Consider location/radiation/quality/duration/timing/severity/associated sxs/prior Treatment) HPI Comments: 44yo F w/ PMH including IDDM, HTN, HLD, CKD, bipolar d/o, OSA who presents with diarrhea. The patient states that almost 2 weeks ago, she and her dad went out to eat in the next day they both developed diarrhea. Both of their symptoms have persisted she brought her father here last night and he was admitted for his symptoms. She states that she has had persistent nonbloody diarrhea w/ associated mild, intermittent, generalized crampy abd pain and has had one episode of vomiting and ongoing nausea. She has had some slightly elevated blood sugars up to 300. She denies any fevers, chest pain, or shortness of breath. She has heart palpitations occasionally and states that they seem to be happening more frequently. No vaginal bleeding or discharge, urinary symptoms, cough/cold symptoms, or recent travel. No recent antibiotic use prior to onset of diarrhea.  Patient is a 44 y.o. female presenting with abdominal pain. The history is provided by the patient.  Abdominal Pain   Past Medical History  Diagnosis Date  . Hypertension   . Diabetes mellitus   . Bipolar disorder ([place])   . CKD (chronic kidney disease), stage II   . Elevated cholesterol   . GERD (gastroesophageal reflux disease)   . Stress incontinence   . Hypothyroidism   . Sleep apnea     HAS C -PAP / DOES NOT USE  . Anxiety   . Depression   . UTI (lower urinary tract infection)   . Tobacco abuse   . Obesity   . DKA, type 1 ([place]) 11/04/2011   Past Surgical History  Procedure Laterality Date  . Ovary removed    . Nasal fracture surgery    . Uterine fibroid surgery  2001  . Pubovaginal sling  08/16/2011    Procedure: [name];   Surgeon: Bernestine Amass, MD;  Location: [facility];  Service: Urology;  Laterality: N/A;         . Incontinence surgery     Family History  Problem Relation Age of Onset  . Asthma Mother   . Heart disease Father   . Cancer Paternal Grandmother     lung and breast  . Bladder Cancer Paternal Grandfather    Social History  Substance Use Topics  . Smoking status: Former Smoker -- 0.50 packs/day for 20 years    Types: Cigarettes  . Smokeless tobacco: None  . Alcohol Use: No   OB History    No data available     Review of Systems  Gastrointestinal: Positive for abdominal pain.   10 Systems reviewed and are negative for acute change except as noted in the HPI.    Allergies  Advair diskus and Biaxin  Home Medications   Prior to Admission medications   Medication Sig Start Date End Date Taking? Authorizing Provider  acetaminophen (TYLENOL) 500 MG tablet Take 1,000 mg by mouth every 6 (six) hours as needed. For pain   Yes Historical Provider, MD  albuterol (PROVENTIL HFA;VENTOLIN HFA) 108 (90 BASE) MCG/ACT inhaler Inhale 2 puffs into the lungs every 6 (six) hours as needed for wheezing.   Yes Historical Provider, MD  aspirin EC 81 MG tablet Take 81 mg by mouth daily.   Yes Historical Provider,  MD  carvedilol (COREG) 25 MG tablet Take 37.5 mg by mouth 2 (two) times daily with a meal.    Yes Historical Provider, MD  cholecalciferol (VITAMIN D) 1000 UNITS tablet Take 1,000 Units by mouth daily.   Yes Historical Provider, MD  fish oil-omega-3 fatty acids 1000 MG capsule Take 1 g by mouth daily.   Yes Historical Provider, MD  fluticasone (FLONASE) 50 MCG/ACT nasal spray Place 2 sprays into the nose daily as needed for allergies.   Yes Historical Provider, MD  folic acid (FOLVITE) Q000111Q MCG tablet Take 800 mcg by mouth daily.   Yes Historical Provider, MD  furosemide (LASIX) 40 MG tablet Take 40 mg by mouth 2 (two) times daily.    Yes Historical Provider, MD  insulin aspart (NOVOLOG) 100  UNIT/ML injection Inject into the skin continuous. Daily Bolus of 2.9 units every hour   Yes Historical Provider, MD  lamoTRIgine (LAMICTAL) 100 MG tablet Take 50 mg by mouth every morning.    Yes Historical Provider, MD  levothyroxine (SYNTHROID, LEVOTHROID) 150 MCG tablet Take 150 mcg by mouth daily before breakfast.   Yes Historical Provider, MD  pravastatin (PRAVACHOL) 80 MG tablet Take 80 mg by mouth at bedtime.   Yes Historical Provider, MD  promethazine (PHENERGAN) 12.5 MG tablet Take 12.5 mg by mouth every 6 (six) hours as needed for nausea or vomiting.   Yes Historical Provider, MD  ranitidine (ZANTAC) 150 MG tablet Take 150 mg by mouth at bedtime.   Yes Historical Provider, MD   BP 143/65 mmHg  Pulse 71  Temp(Src) 97.8 F (36.6 C) (Oral)  Resp 13  Ht 5\' 9"  (1.753 m)  Wt 290 lb (131.543 kg)  BMI 42.81 kg/m2  SpO2 95% Physical Exam  Constitutional: She is oriented to person, place, and time. She appears well-developed and well-nourished. No distress.  HENT:  Head: Normocephalic and atraumatic.  Mouth/Throat: Oropharynx is clear and moist.  Moist mucous membranes  Eyes: Conjunctivae are normal. Pupils are equal, round, and reactive to light.  Neck: Neck supple.  Cardiovascular: Normal rate, regular rhythm and normal heart sounds.   No murmur heard. Pulmonary/Chest: Effort normal and breath sounds normal.  Abdominal: Soft. Bowel sounds are normal. She exhibits no distension. There is no tenderness.  Musculoskeletal: She exhibits no edema.  Neurological: She is alert and oriented to person, place, and time.  Fluent speech  Skin: Skin is warm and dry.  Psychiatric: She has a normal mood and affect. Judgment normal.  Nursing note and vitals reviewed.   ED Course  Procedures (including critical care time) Labs Review Labs Reviewed  COMPREHENSIVE METABOLIC PANEL - Abnormal; Notable for the following:    Glucose, Bld 139 (*)    BUN 24 (*)    Creatinine, Ser 1.87 (*)     Calcium 8.6 (*)    GFR calc non Af Amer 32 (*)    GFR calc Af Amer 37 (*)    All other components within normal limits  LIPASE, BLOOD - Abnormal; Notable for the following:    Lipase 21 (*)    All other components within normal limits  CBG MONITORING, ED - Abnormal; Notable for the following:    Glucose-Capillary 142 (*)    All other components within normal limits  CBC  URINALYSIS, ROUTINE W REFLEX MICROSCOPIC (NOT AT [facility])    Imaging Review No results found. I have personally reviewed and evaluated these lab results as part of my medical decision-making.   EKG  Interpretation   Date/Time:  Wednesday March 19 2015 15:14:33 EDT Ventricular Rate:  69 PR Interval:  46 QRS Duration: 95 QT Interval:  404 QTC Calculation: 433 R Axis:   16 Text Interpretation:  Ectopic atrial rhythm Short PR interval Low voltage,  precordial leads borderline T wave abnormalities in V5-V6 likely due to  artifact, otherwise unchanged from previous Confirmed by LITTLE MD, RACHEL  [phone]) on 03/19/2015 3:34:58 PM     Medications  sodium chloride 0.9 % bolus 1,000 mL (1,000 mLs Intravenous New Bag/Given 03/19/15 1537)  ondansetron (ZOFRAN) injection 4 mg (4 mg Intravenous Given 03/19/15 1540)    MDM   Final diagnoses:  Diarrhea, unspecified type    44yo F w/ almost 2 weeks of non-bloody diarrhea; father developed same symptoms and was brought here last night. Pt non-toxic in appearance at presentation w/ normal VS. no abdominal tenderness on exam. Obtained above labs as well as EKG which showed no significant changes from previous. No tachycardia in the emergency department. Gave the patient an IV fluid bolus and Zofran.  I reviewed the patient's father's chart which shows he tested negative for C. difficile. She has no other risk factors for C. difficile thus this seems unlikely. Patient's labs show mild hyperglycemia at 139 and stable CK D at 1.87. The rest of her labs are otherwise reassuring  with a normal WBC count. On reexamination, the patient remains well-appearing. Instructed on supportive care including Lactobacillus. Instructed to follow-up with PCP if not improved in the next few days. Reviewed return precautions and patient voiced understanding. Patient was discharged in satisfactory condition.  Sharlett Iles, MD 03/19/15 [phone]

## 2015-03-19 NOTE — ED Notes (Signed)
Pt reports stomach bug x2 weeks, n/v/d and increased heart palpitations from baseline.  Pt alert and oriented.

## 2015-03-24 ENCOUNTER — Emergency Department (HOSPITAL_COMMUNITY)
Admission: EM | Admit: 2015-03-24 | Discharge: 2015-03-24 | Discharge status: Against Medical Advice | Payer: BLUE CROSS/BLUE SHIELD | Attending: Emergency Medicine | Admitting: Emergency Medicine

## 2015-03-24 ENCOUNTER — Encounter (HOSPITAL_COMMUNITY): Payer: Self-pay | Admitting: *Deleted

## 2015-03-24 DIAGNOSIS — E119 Type 2 diabetes mellitus without complications: Secondary | ICD-10-CM | POA: Diagnosis not present

## 2015-03-24 DIAGNOSIS — I129 Hypertensive chronic kidney disease with stage 1 through stage 4 chronic kidney disease, or unspecified chronic kidney disease: Secondary | ICD-10-CM | POA: Insufficient documentation

## 2015-03-24 DIAGNOSIS — R109 Unspecified abdominal pain: Secondary | ICD-10-CM | POA: Insufficient documentation

## 2015-03-24 DIAGNOSIS — R1110 Vomiting, unspecified: Secondary | ICD-10-CM | POA: Diagnosis not present

## 2015-03-24 DIAGNOSIS — N182 Chronic kidney disease, stage 2 (mild): Secondary | ICD-10-CM | POA: Insufficient documentation

## 2015-03-24 DIAGNOSIS — E669 Obesity, unspecified: Secondary | ICD-10-CM | POA: Insufficient documentation

## 2015-03-24 NOTE — ED Notes (Signed)
Pt c/o having a stomach virus x 2 weeks and states her stomach is cramping and she is having vomiting

## 2015-03-24 NOTE — ED Notes (Signed)
Registration spoke with pt about her keys and found out pt had already left hospital premises and was at home

## 2015-03-26 ENCOUNTER — Other Ambulatory Visit: Payer: Self-pay | Admitting: Family Medicine

## 2015-03-26 DIAGNOSIS — R1011 Right upper quadrant pain: Secondary | ICD-10-CM

## 2015-03-26 DIAGNOSIS — R112 Nausea with vomiting, unspecified: Secondary | ICD-10-CM

## 2015-03-28 ENCOUNTER — Ambulatory Visit
Admission: RE | Admit: 2015-03-28 | Discharge: 2015-03-28 | Disposition: A | Discharge status: Home / Self Care | Payer: Medicare Other | Source: Ambulatory Visit | Attending: Family Medicine | Admitting: Family Medicine

## 2015-03-28 DIAGNOSIS — R1011 Right upper quadrant pain: Secondary | ICD-10-CM

## 2015-03-28 DIAGNOSIS — R112 Nausea with vomiting, unspecified: Secondary | ICD-10-CM

## 2015-05-17 ENCOUNTER — Emergency Department (HOSPITAL_COMMUNITY)
Admission: EM | Admit: 2015-05-17 | Discharge: 2015-05-17 | Disposition: A | Discharge status: Home / Self Care | Payer: BLUE CROSS/BLUE SHIELD | Attending: Emergency Medicine | Admitting: Emergency Medicine

## 2015-05-17 ENCOUNTER — Encounter (HOSPITAL_COMMUNITY): Payer: Self-pay

## 2015-05-17 DIAGNOSIS — N182 Chronic kidney disease, stage 2 (mild): Secondary | ICD-10-CM | POA: Insufficient documentation

## 2015-05-17 DIAGNOSIS — E669 Obesity, unspecified: Secondary | ICD-10-CM | POA: Diagnosis not present

## 2015-05-17 DIAGNOSIS — Z794 Long term (current) use of insulin: Secondary | ICD-10-CM | POA: Diagnosis not present

## 2015-05-17 DIAGNOSIS — E7800 Pure hypercholesterolemia, unspecified: Secondary | ICD-10-CM | POA: Insufficient documentation

## 2015-05-17 DIAGNOSIS — F419 Anxiety disorder, unspecified: Secondary | ICD-10-CM | POA: Insufficient documentation

## 2015-05-17 DIAGNOSIS — G4730 Sleep apnea, unspecified: Secondary | ICD-10-CM | POA: Insufficient documentation

## 2015-05-17 DIAGNOSIS — Z79899 Other long term (current) drug therapy: Secondary | ICD-10-CM | POA: Insufficient documentation

## 2015-05-17 DIAGNOSIS — Z9981 Dependence on supplemental oxygen: Secondary | ICD-10-CM | POA: Diagnosis not present

## 2015-05-17 DIAGNOSIS — E039 Hypothyroidism, unspecified: Secondary | ICD-10-CM | POA: Insufficient documentation

## 2015-05-17 DIAGNOSIS — E1065 Type 1 diabetes mellitus with hyperglycemia: Secondary | ICD-10-CM | POA: Insufficient documentation

## 2015-05-17 DIAGNOSIS — I129 Hypertensive chronic kidney disease with stage 1 through stage 4 chronic kidney disease, or unspecified chronic kidney disease: Secondary | ICD-10-CM | POA: Diagnosis not present

## 2015-05-17 DIAGNOSIS — R739 Hyperglycemia, unspecified: Secondary | ICD-10-CM

## 2015-05-17 DIAGNOSIS — Z87891 Personal history of nicotine dependence: Secondary | ICD-10-CM | POA: Insufficient documentation

## 2015-05-17 DIAGNOSIS — Z7982 Long term (current) use of aspirin: Secondary | ICD-10-CM | POA: Diagnosis not present

## 2015-05-17 DIAGNOSIS — K219 Gastro-esophageal reflux disease without esophagitis: Secondary | ICD-10-CM | POA: Diagnosis not present

## 2015-05-17 DIAGNOSIS — F319 Bipolar disorder, unspecified: Secondary | ICD-10-CM | POA: Diagnosis not present

## 2015-05-17 DIAGNOSIS — Z87440 Personal history of urinary (tract) infections: Secondary | ICD-10-CM | POA: Insufficient documentation

## 2015-05-17 LAB — URINE MICROSCOPIC-ADD ON

## 2015-05-17 LAB — BASIC METABOLIC PANEL
ANION GAP: 10 (ref 5–15)
BUN: 37 mg/dL — ABNORMAL HIGH (ref 6–20)
CALCIUM: 8.9 mg/dL (ref 8.9–10.3)
CO2: 25 mmol/L (ref 22–32)
Chloride: 95 mmol/L — ABNORMAL LOW (ref 101–111)
Creatinine, Ser: 1.98 mg/dL — ABNORMAL HIGH (ref 0.44–1.00)
GFR calc Af Amer: 35 mL/min — ABNORMAL LOW (ref >60)
GFR, EST NON AFRICAN AMERICAN: 30 mL/min — AB (ref >60)
Glucose, Bld: 559 mg/dL (ref 65–99)
Potassium: 4.6 mmol/L (ref 3.5–5.1)
SODIUM: 130 mmol/L — AB (ref 135–145)

## 2015-05-17 LAB — CBC WITH DIFFERENTIAL/PLATELET
BASOS PCT: 1 %
Basophils Absolute: 0.1 10*3/uL (ref 0.0–0.1)
EOS PCT: 2 %
Eosinophils Absolute: 0.2 10*3/uL (ref 0.0–0.7)
HEMATOCRIT: 39.9 % (ref 36.0–46.0)
HEMOGLOBIN: 13.4 g/dL (ref 12.0–15.0)
Lymphocytes Relative: 23 %
Lymphs Abs: 2.3 10*3/uL (ref 0.7–4.0)
MCH: 30.9 pg (ref 26.0–34.0)
MCHC: 33.6 g/dL (ref 30.0–36.0)
MCV: 91.9 fL (ref 78.0–100.0)
MONOS PCT: 7 %
Monocytes Absolute: 0.7 10*3/uL (ref 0.1–1.0)
NEUTROS ABS: 6.9 10*3/uL (ref 1.7–7.7)
Neutrophils Relative %: 67 %
Platelets: 243 10*3/uL (ref 150–400)
RBC: 4.34 MIL/uL (ref 3.87–5.11)
RDW: 13 % (ref 11.5–15.5)
WBC: 10.2 10*3/uL (ref 4.0–10.5)

## 2015-05-17 LAB — CBG MONITORING, ED
Glucose-Capillary: 548 mg/dL — ABNORMAL HIGH (ref 65–99)
Glucose-Capillary: 478 mg/dL — ABNORMAL HIGH (ref 65–99)
Glucose-Capillary: 428 mg/dL — ABNORMAL HIGH (ref 65–99)
Glucose-Capillary: 259 mg/dL — ABNORMAL HIGH (ref 65–99)
Glucose-Capillary: 267 mg/dL — ABNORMAL HIGH (ref 65–99)

## 2015-05-17 LAB — URINALYSIS, ROUTINE W REFLEX MICROSCOPIC
Bilirubin Urine: NEGATIVE
Glucose, UA: >1000 mg/dL — AB
KETONES UR: 15 mg/dL — AB
LEUKOCYTES UA: NEGATIVE
NITRITE: NEGATIVE
PH: 6.5 (ref 5.0–8.0)
PROTEIN: NEGATIVE mg/dL
Specific Gravity, Urine: <1.005 — ABNORMAL LOW (ref 1.005–1.030)

## 2015-05-17 MED ORDER — SODIUM CHLORIDE 0.9 % IV SOLN
1000.0000 mL | Freq: Once | INTRAVENOUS | Status: AC
  Administered 2015-05-17: 1000 mL via INTRAVENOUS

## 2015-05-17 MED ORDER — SODIUM CHLORIDE 0.9 % IV SOLN
INTRAVENOUS | Status: DC
  Administered 2015-05-17: 4.9 [IU]/h via INTRAVENOUS
  Filled 2015-05-17: qty 2.5

## 2015-05-17 MED ORDER — SODIUM CHLORIDE 0.9 % IV SOLN
INTRAVENOUS | Status: AC
  Filled 2015-05-17: qty 2.5

## 2015-05-17 MED ORDER — SODIUM CHLORIDE 0.9 % IV SOLN
1000.0000 mL | INTRAVENOUS | Status: DC
  Administered 2015-05-17: 1000 mL via INTRAVENOUS

## 2015-05-17 NOTE — ED Provider Notes (Addendum)
CSN: [id number]     Arrival date & time 05/17/15  0250 History   First MD Initiated Contact with Patient 05/17/15 0305    Chief Complaint  Patient presents with  . Hyperglycemia     (Consider location/radiation/quality/duration/timing/severity/associated sxs/prior Treatment) HPI patient reports she is on an insulin pump. She states on December 8 her blood sugar had been high and she worked on all day and got it down. Yesterday her blood sugar was in the 200s in the morning and she got it down between 70 and 160. She states tonight it started going up and her meter read too high. She states she started getting polydipsia and polyuria couple hours ago. She states she checked her urine and she had trace ketones in her urine. She denies abdominal pain but has nausea without vomiting or diarrhea. She relates her elevated blood sugar and difficulty controlling it to her starting her period.  PCP PA Deatra Ina at [facility] Dr Iran Planas  Past Medical History  Diagnosis Date  . Hypertension   . Diabetes mellitus   . Bipolar disorder ([place])   . CKD (chronic kidney disease), stage II   . Elevated cholesterol   . GERD (gastroesophageal reflux disease)   . Stress incontinence   . Hypothyroidism   . Sleep apnea     HAS C -PAP / DOES NOT USE  . Anxiety   . Depression   . UTI (lower urinary tract infection)   . Tobacco abuse   . Obesity   . DKA, type 1 ([place]) 11/04/2011   Past Surgical History  Procedure Laterality Date  . Ovary removed    . Nasal fracture surgery    . Uterine fibroid surgery  2001  . Pubovaginal sling  08/16/2011    Procedure: [name];  Surgeon: Bernestine Amass, MD;  Location: [facility];  Service: Urology;  Laterality: N/A;         . Incontinence surgery     Family History  Problem Relation Age of Onset  . Asthma Mother   . Heart disease Father   . Cancer Paternal Grandmother     lung and breast  . Bladder Cancer Paternal Grandfather    Social  History  Substance Use Topics  . Smoking status: Former Smoker -- 0.50 packs/day for 20 years    Types: Cigarettes  . Smokeless tobacco: None  . Alcohol Use: No   unemployed  OB History    No data available     Review of Systems  All other systems reviewed and are negative.     Allergies  Advair diskus and Biaxin  Home Medications   Prior to Admission medications   Medication Sig Start Date End Date Taking? Authorizing Provider  acetaminophen (TYLENOL) 500 MG tablet Take 1,000 mg by mouth every 6 (six) hours as needed. For pain    Historical Provider, MD  albuterol (PROVENTIL HFA;VENTOLIN HFA) 108 (90 BASE) MCG/ACT inhaler Inhale 2 puffs into the lungs every 6 (six) hours as needed for wheezing.    Historical Provider, MD  aspirin EC 81 MG tablet Take 81 mg by mouth daily.    Historical Provider, MD  carvedilol (COREG) 25 MG tablet Take 37.5 mg by mouth 2 (two) times daily with a meal.     Historical Provider, MD  cholecalciferol (VITAMIN D) 1000 UNITS tablet Take 1,000 Units by mouth daily.    Historical Provider, MD  fish oil-omega-3 fatty acids 1000 MG capsule Take 1 g by mouth  daily.    Historical Provider, MD  fluticasone (FLONASE) 50 MCG/ACT nasal spray Place 2 sprays into the nose daily as needed for allergies.    Historical Provider, MD  folic acid (FOLVITE) Q000111Q MCG tablet Take 800 mcg by mouth daily.    Historical Provider, MD  furosemide (LASIX) 40 MG tablet Take 40 mg by mouth 2 (two) times daily.     Historical Provider, MD  insulin aspart (NOVOLOG) 100 UNIT/ML injection Inject into the skin continuous. Daily Bolus of 2.9 units every hour    Historical Provider, MD  lamoTRIgine (LAMICTAL) 100 MG tablet Take 50 mg by mouth every morning.     Historical Provider, MD  levothyroxine (SYNTHROID, LEVOTHROID) 150 MCG tablet Take 150 mcg by mouth daily before breakfast.    Historical Provider, MD  pravastatin (PRAVACHOL) 80 MG tablet Take 80 mg by mouth at bedtime.     Historical Provider, MD  promethazine (PHENERGAN) 12.5 MG tablet Take 12.5 mg by mouth every 6 (six) hours as needed for nausea or vomiting.    Historical Provider, MD  ranitidine (ZANTAC) 150 MG tablet Take 150 mg by mouth at bedtime.    Historical Provider, MD   BP 149/75 mmHg  Pulse 78  Temp(Src) 97.9 F (36.6 C) (Oral)  Resp 22  Ht 5\' 9"  (1.753 m)  Wt 297 lb (134.718 kg)  BMI 43.84 kg/m2  SpO2 100%  LMP 05/14/2015  Vital signs normal   Physical Exam  Constitutional: She is oriented to person, place, and time. She appears well-developed and well-nourished.  Non-toxic appearance. She does not appear ill. No distress.  obese  HENT:  Head: Normocephalic and atraumatic.  Right Ear: External ear normal.  Left Ear: External ear normal.  Nose: Nose normal. No mucosal edema or rhinorrhea.  Mouth/Throat: Mucous membranes are normal. No dental abscesses or uvula swelling.  Dry tongue  Eyes: Conjunctivae and EOM are normal. Pupils are equal, round, and reactive to light.  Neck: Normal range of motion and full passive range of motion without pain. Neck supple.  Cardiovascular: Normal rate, regular rhythm and normal heart sounds.  Exam reveals no gallop and no friction rub.   No murmur heard. Pulmonary/Chest: Effort normal and breath sounds normal. No respiratory distress. She has no wheezes. She has no rhonchi. She has no rales. She exhibits no tenderness and no crepitus.  Abdominal: Soft. Normal appearance and bowel sounds are normal. She exhibits no distension. There is no tenderness. There is no rebound and no guarding.  Musculoskeletal: Normal range of motion. She exhibits no edema or tenderness.  Moves all extremities well.   Neurological: She is alert and oriented to person, place, and time. She has normal strength. No cranial nerve deficit.  Skin: Skin is warm, dry and intact. No rash noted. No erythema. No pallor.  Psychiatric: She has a normal mood and affect. Her speech is  normal and behavior is normal. Her mood appears not anxious.  Nursing note and vitals reviewed.   ED Course  Procedures (including critical care time)  Medications  0.9 %  sodium chloride infusion (0 mLs Intravenous Stopped 05/17/15 0542)    Followed by  0.9 %  sodium chloride infusion (0 mLs Intravenous Stopped 05/17/15 0647)    Followed by  0.9 %  sodium chloride infusion (1,000 mLs Intravenous New Bag/Given 05/17/15 0646)  insulin regular (NOVOLIN R,HUMULIN R) 250 Units in sodium chloride 0.9 % 250 mL (1 Units/mL) infusion (6.2 Units/hr Intravenous Rate/Dose Change 05/17/15 0647)  Patient was given IV fluids and started on the glucomander.  04:40 we discussed her test results including that she is not in DKA. We discussed we would continue to hydrate her and keep her on the insulin drip until we got her CBG controlled.  Patient continued on IV fluids and the glucometer to control her blood glucose. At time of discharge her CBG was 267. She states at this point she feels like she can manage her hyperglycemia I herself. She feels ready to be discharged.   Labs Review Results for orders placed or performed during the hospital encounter of [id number]  Basic metabolic panel  Result Value Ref Range   Sodium 130 (L) 135 - 145 mmol/L   Potassium 4.6 3.5 - 5.1 mmol/L   Chloride 95 (L) 101 - 111 mmol/L   CO2 25 22 - 32 mmol/L   Glucose, Bld 559 (HH) 65 - 99 mg/dL   BUN 37 (H) 6 - 20 mg/dL   Creatinine, Ser 1.98 (H) 0.44 - 1.00 mg/dL   Calcium 8.9 8.9 - 10.3 mg/dL   GFR calc non Af Amer 30 (L) >60 mL/min   GFR calc Af Amer 35 (L) >60 mL/min   Anion gap 10 5 - 15  CBC with Differential  Result Value Ref Range   WBC 10.2 4.0 - 10.5 K/uL   RBC 4.34 3.87 - 5.11 MIL/uL   Hemoglobin 13.4 12.0 - 15.0 g/dL   HCT 39.9 36.0 - 46.0 %   MCV 91.9 78.0 - 100.0 fL   MCH 30.9 26.0 - 34.0 pg   MCHC 33.6 30.0 - 36.0 g/dL   RDW 13.0 11.5 - 15.5 %   Platelets 243 150 - 400 K/uL   Neutrophils  Relative % 67 %   Neutro Abs 6.9 1.7 - 7.7 K/uL   Lymphocytes Relative 23 %   Lymphs Abs 2.3 0.7 - 4.0 K/uL   Monocytes Relative 7 %   Monocytes Absolute 0.7 0.1 - 1.0 K/uL   Eosinophils Relative 2 %   Eosinophils Absolute 0.2 0.0 - 0.7 K/uL   Basophils Relative 1 %   Basophils Absolute 0.1 0.0 - 0.1 K/uL  Urinalysis, Routine w reflex microscopic  Result Value Ref Range   Color, Urine YELLOW YELLOW   APPearance CLEAR CLEAR   Specific Gravity, Urine <1.005 (L) 1.005 - 1.030   pH 6.5 5.0 - 8.0   Glucose, UA >1000 (A) NEGATIVE mg/dL   Hgb urine dipstick LARGE (A) NEGATIVE   Bilirubin Urine NEGATIVE NEGATIVE   Ketones, ur 15 (A) NEGATIVE mg/dL   Protein, ur NEGATIVE NEGATIVE mg/dL   Nitrite NEGATIVE NEGATIVE   Leukocytes, UA NEGATIVE NEGATIVE  Urine microscopic-add on  Result Value Ref Range   Squamous Epithelial / LPF 0-5 (A) NONE SEEN   WBC, UA 0-5 0 - 5 WBC/hpf   RBC / HPF 6-30 0 - 5 RBC/hpf   Bacteria, UA RARE (A) NONE SEEN  CBG monitoring, ED  Result Value Ref Range   Glucose-Capillary 548 (H) 65 - 99 mg/dL   Comment 1 Document in Chart    Comment 2 Call MD NNP PA CNM   CBG monitoring, ED  Result Value Ref Range   Glucose-Capillary 478 (H) 65 - 99 mg/dL  CBG monitoring, ED  Result Value Ref Range   Glucose-Capillary 428 (H) 65 - 99 mg/dL  CBG monitoring, ED  Result Value Ref Range   Glucose-Capillary 267 (H) 65 - 99 mg/dL   Laboratory interpretation all normal  except hyperglycemia     Imaging Review No results found. I have personally reviewed and evaluated these images and lab results as part of my medical decision-making.   EKG Interpretation None      MDM   patient presents with diabetes on insulin pump. She reports whenever she starts her. Her diabetes gets very difficult to control. Yesterday her beater finally read too high and she came to the emergency department. She was given IV fluids and IV insulin and her CBG improved.    Final diagnoses:   Hyperglycemia    Plan discharge  Rolland Porter, MD, Barbette Or, MD 05/17/15 [place], MD 05/17/15 [phone]

## 2015-05-17 NOTE — ED Notes (Signed)
CRITICAL VALUE ALERT  Critical value received:  Glucose 559  Date of notification:  05/17/15  Time of notification:  [id number]  Critical value read back:Yes.    Nurse who received alert:  RCockram  Responding MD:  IVA Knapp  Pt being treated, insulin drip and gluco stablizer started

## 2015-05-17 NOTE — Discharge Instructions (Signed)
Monitor your blood sugar closely, recheck if you get worse again.

## 2015-05-17 NOTE — ED Notes (Signed)
Pt states she has an insulin pump and states when her menstrual cycle starts sometimes her blood sugars act funny and are up and down.  Pt states this am she got readings in the high 500's.  Pt c/o nausea, no vomiting or diarrhea, denies pain

## 2015-05-17 NOTE — ED Notes (Signed)
Pt made aware to return if symptoms worsen or if any life threatening symptoms occur.   

## 2015-09-30 ENCOUNTER — Emergency Department (HOSPITAL_COMMUNITY)
Admission: EM | Admit: 2015-09-30 | Discharge: 2015-10-01 | Disposition: A | Discharge status: Home / Self Care | Payer: BLUE CROSS/BLUE SHIELD | Attending: Emergency Medicine | Admitting: Emergency Medicine

## 2015-09-30 DIAGNOSIS — I129 Hypertensive chronic kidney disease with stage 1 through stage 4 chronic kidney disease, or unspecified chronic kidney disease: Secondary | ICD-10-CM | POA: Diagnosis not present

## 2015-09-30 DIAGNOSIS — E039 Hypothyroidism, unspecified: Secondary | ICD-10-CM | POA: Insufficient documentation

## 2015-09-30 DIAGNOSIS — E1122 Type 2 diabetes mellitus with diabetic chronic kidney disease: Secondary | ICD-10-CM | POA: Diagnosis not present

## 2015-09-30 DIAGNOSIS — F410 Panic disorder [episodic paroxysmal anxiety] without agoraphobia: Secondary | ICD-10-CM

## 2015-09-30 DIAGNOSIS — F419 Anxiety disorder, unspecified: Secondary | ICD-10-CM | POA: Insufficient documentation

## 2015-09-30 DIAGNOSIS — N182 Chronic kidney disease, stage 2 (mild): Secondary | ICD-10-CM | POA: Insufficient documentation

## 2015-09-30 DIAGNOSIS — Z87891 Personal history of nicotine dependence: Secondary | ICD-10-CM | POA: Diagnosis not present

## 2015-09-30 DIAGNOSIS — F319 Bipolar disorder, unspecified: Secondary | ICD-10-CM | POA: Diagnosis not present

## 2015-09-30 DIAGNOSIS — R002 Palpitations: Secondary | ICD-10-CM | POA: Insufficient documentation

## 2015-10-01 ENCOUNTER — Encounter (HOSPITAL_COMMUNITY): Payer: Self-pay | Admitting: *Deleted

## 2015-10-01 DIAGNOSIS — F410 Panic disorder [episodic paroxysmal anxiety] without agoraphobia: Secondary | ICD-10-CM | POA: Diagnosis not present

## 2015-10-01 LAB — CBG MONITORING, ED: GLUCOSE-CAPILLARY: 170 mg/dL — AB (ref 65–99)

## 2015-10-01 MED ORDER — LORAZEPAM 1 MG PO TABS: 1.0000 mg | ORAL_TABLET | Freq: Three times a day (TID) | ORAL | Status: DC | PRN

## 2015-10-01 MED ORDER — LORAZEPAM 1 MG PO TABS
1.0000 mg | ORAL_TABLET | Freq: Once | ORAL | Status: AC
  Administered 2015-10-01: 1 mg via ORAL
  Filled 2015-10-01: qty 1

## 2015-10-01 NOTE — ED Notes (Signed)
Pt states she has been having panic attacks x 2 days; pt states she feels like her heart racing and she has sob

## 2015-10-01 NOTE — ED Notes (Signed)
Lying on bed on r side with eyes closed encouraged to sit up and take meds- pt is not tremulous at this time

## 2015-10-01 NOTE — ED Notes (Signed)
Pt reports feeling some better per her report.

## 2015-10-01 NOTE — Discharge Instructions (Signed)

## 2015-10-01 NOTE — ED Provider Notes (Signed)
CSN: [id number]     Arrival date & time 09/30/15  2346 History  By signing my name below, I, Rowan Blase, attest that this documentation has been prepared under the direction and in the presence of Merryl Hacker, MD . Electronically Signed: Rowan Blase, Scribe. 10/01/2015. 12:19 AM.   Chief Complaint  Patient presents with  . Panic Attack   The history is provided by the patient. No language interpreter was used.   HPI Comments:  Jasmine [name] is a 45 y.o. female with PMHx of anxiety, depression, bipolar disorder, HTN, HLD, DM, and CKD who presents to the Emergency Department complaining of persistent anxiety attacks for the past two days. Pt states she is having irrational fears and is unable to control them. Pt reports associated episodes of shortness of breath, palpitations earlier tonight, feeling "hot" in chest, and increased stress recently. SOB and palpitations currently alleviated. Pt reports taking .5 mg Xanex four days ago with temporary relief. She notes recent medication changes; she quit taking Clonopin and was started on hydroxyzine which worsened her palpations so she then stopped hydroxyzine. Denies suicidal ideations or any other complaints at this time.  Past Medical History  Diagnosis Date  . Hypertension   . Diabetes mellitus   . Bipolar disorder ([place])   . CKD (chronic kidney disease), stage II   . Elevated cholesterol   . GERD (gastroesophageal reflux disease)   . Stress incontinence   . Hypothyroidism   . Sleep apnea     HAS C -PAP / DOES NOT USE  . Anxiety   . Depression   . UTI (lower urinary tract infection)   . Tobacco abuse   . Obesity   . DKA, type 1 ([place]) 11/04/2011   Past Surgical History  Procedure Laterality Date  . Ovary removed    . Nasal fracture surgery    . Uterine fibroid surgery  2001  . Pubovaginal sling  08/16/2011    Procedure: [name];  Surgeon: Bernestine Amass, MD;  Location: [facility];  Service: Urology;   Laterality: N/A;         . Incontinence surgery     Family History  Problem Relation Age of Onset  . Asthma Mother   . Heart disease Father   . Cancer Paternal Grandmother     lung and breast  . Bladder Cancer Paternal Grandfather    Social History  Substance Use Topics  . Smoking status: Former Smoker -- 0.50 packs/day for 20 years    Types: Cigarettes  . Smokeless tobacco: None  . Alcohol Use: No   OB History    No data available     Review of Systems  Cardiovascular: Positive for palpitations. Negative for chest pain.  Psychiatric/Behavioral: Negative for suicidal ideas. The patient is nervous/anxious.   All other systems reviewed and are negative.  Allergies  Advair diskus and Biaxin  Home Medications   Prior to Admission medications   Medication Sig Start Date End Date Taking? Authorizing Provider  acetaminophen (TYLENOL) 500 MG tablet Take 1,000 mg by mouth every 6 (six) hours as needed. For pain    Historical Provider, MD  albuterol (PROVENTIL HFA;VENTOLIN HFA) 108 (90 BASE) MCG/ACT inhaler Inhale 2 puffs into the lungs every 6 (six) hours as needed for wheezing.    Historical Provider, MD  aspirin EC 81 MG tablet Take 81 mg by mouth daily.    Historical Provider, MD  carvedilol (COREG) 25 MG tablet Take 37.5 mg by  mouth 2 (two) times daily with a meal.     Historical Provider, MD  cholecalciferol (VITAMIN D) 1000 UNITS tablet Take 1,000 Units by mouth daily.    Historical Provider, MD  fish oil-omega-3 fatty acids 1000 MG capsule Take 1 g by mouth daily.    Historical Provider, MD  fluticasone (FLONASE) 50 MCG/ACT nasal spray Place 2 sprays into the nose daily as needed for allergies.    Historical Provider, MD  folic acid (FOLVITE) Q000111Q MCG tablet Take 800 mcg by mouth daily.    Historical Provider, MD  furosemide (LASIX) 40 MG tablet Take 40 mg by mouth 2 (two) times daily.     Historical Provider, MD  insulin aspart (NOVOLOG) 100 UNIT/ML injection Inject into  the skin continuous. Daily Bolus of 2.9 units every hour    Historical Provider, MD  lamoTRIgine (LAMICTAL) 100 MG tablet Take 50 mg by mouth every morning.     Historical Provider, MD  levothyroxine (SYNTHROID, LEVOTHROID) 150 MCG tablet Take 150 mcg by mouth daily before breakfast.    Historical Provider, MD  LORazepam (ATIVAN) 1 MG tablet Take 1 tablet (1 mg total) by mouth every 8 (eight) hours as needed for anxiety. 10/01/15   Merryl Hacker, MD  pravastatin (PRAVACHOL) 80 MG tablet Take 80 mg by mouth at bedtime.    Historical Provider, MD  promethazine (PHENERGAN) 12.5 MG tablet Take 12.5 mg by mouth every 6 (six) hours as needed for nausea or vomiting.    Historical Provider, MD  ranitidine (ZANTAC) 150 MG tablet Take 150 mg by mouth at bedtime.    Historical Provider, MD   BP 152/94 mmHg  Pulse 62  Temp(Src) 98.6 F (37 C) (Oral)  Resp 16  Ht 5\' 9"  (1.753 m)  Wt 300 lb (136.079 kg)  BMI 44.28 kg/m2  SpO2 98%  LMP 09/17/2015 Physical Exam  Constitutional: She is oriented to person, place, and time. She appears well-developed and well-nourished.  Morbidly obese, tearful  HENT:  Head: Normocephalic and atraumatic.  Eyes: Pupils are equal, round, and reactive to light.  Cardiovascular: Normal rate, regular rhythm and normal heart sounds.   No murmur heard. Pulmonary/Chest: Effort normal and breath sounds normal. No respiratory distress. She has no wheezes.  Abdominal: Soft. There is no tenderness.  Musculoskeletal: She exhibits no edema.  Neurological: She is alert and oriented to person, place, and time.  Skin: Skin is warm and dry.  Insulin pump noted right upper extremity  Psychiatric:  Anxious appearing  Nursing note and vitals reviewed.   ED Course  Procedures  DIAGNOSTIC STUDIES:  Oxygen Saturation is 97% on RA, normal by my interpretation.    COORDINATION OF CARE:  12:17 AM Discussed treatment plan with pt at bedside and pt agreed to plan.  Labs  Review Labs Reviewed  CBG MONITORING, ED - Abnormal; Notable for the following:    Glucose-Capillary 170 (*)    All other components within normal limits    Imaging Review No results found. I have personally reviewed and evaluated these images and lab results as part of my medical decision-making.   EKG Interpretation   Date/Time:  Wednesday October 01 2015 00:35:19 EDT Ventricular Rate:  63 PR Interval:  135 QRS Duration: 106 QT Interval:  419 QTC Calculation: 429 R Axis:   -3 Text Interpretation:  Normal sinus rhythm Low voltage, precordial leads  Baseline wander in lead(s) V2 Reconfirmed by HORTON  MD, [place] ([id number])  on 10/01/2015 1:50:23 AM  MDM   Final diagnoses:  Panic attack    Patient presents with increasing generalized anxiety and irrational fear. She is followed [facility]. She has not seen her psychiatrist. She stopped taking hydroxyzine because it made her palpitations worse. She is very tearful.  Did take a Xanax 4 days ago. She is otherwise nontoxic. Vital signs are reassuring. Denies SI or HI.  EKG is reassuring.  Patient was given 1 dose of Ativan.  1:50 AM On recheck, patient reports improvement of her symptoms. Discussed that she needs to follow-up very closely with her psychiatrist. Given that she is unable to tolerate hydroxyzine, she will be given a very Short course of Ativan.  After history, exam, and medical workup I feel the patient has been appropriately medically screened and is safe for discharge home. Pertinent diagnoses were discussed with the patient. Patient was given return precautions.  I personally performed the services described in this documentation, which was scribed in my presence. The recorded information has been reviewed and is accurate.   Merryl Hacker, MD 10/01/15 [phone]

## 2015-10-01 NOTE — ED Notes (Addendum)
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 She reports using for the last month Xanax 0.5mg  without her physicians knowledge and ran out of meds on Saturday. She also reports she was once on klonopin and was changed to hydroxizine which she stopped taking without her psychiatrist knowledge. She reports a panic attack and when queried regarding therapies she has been given to abate the symptoms, she reports that she watched televisiion and tried to visualize but it did not work. Pt has no tremor save to her r index finger The emergency physician is informed.

## 2015-10-01 NOTE — ED Notes (Signed)
cbg of 170.

## 2015-10-16 ENCOUNTER — Emergency Department (HOSPITAL_COMMUNITY): Payer: BLUE CROSS/BLUE SHIELD

## 2015-10-16 ENCOUNTER — Encounter (HOSPITAL_COMMUNITY): Payer: Self-pay

## 2015-10-16 ENCOUNTER — Emergency Department (HOSPITAL_COMMUNITY)
Admission: EM | Admit: 2015-10-16 | Discharge: 2015-10-16 | Disposition: A | Discharge status: Home / Self Care | Payer: BLUE CROSS/BLUE SHIELD | Attending: Emergency Medicine | Admitting: Emergency Medicine

## 2015-10-16 DIAGNOSIS — Z87891 Personal history of nicotine dependence: Secondary | ICD-10-CM | POA: Diagnosis not present

## 2015-10-16 DIAGNOSIS — F319 Bipolar disorder, unspecified: Secondary | ICD-10-CM | POA: Insufficient documentation

## 2015-10-16 DIAGNOSIS — N182 Chronic kidney disease, stage 2 (mild): Secondary | ICD-10-CM | POA: Insufficient documentation

## 2015-10-16 DIAGNOSIS — E039 Hypothyroidism, unspecified: Secondary | ICD-10-CM | POA: Diagnosis not present

## 2015-10-16 DIAGNOSIS — E1010 Type 1 diabetes mellitus with ketoacidosis without coma: Secondary | ICD-10-CM | POA: Diagnosis not present

## 2015-10-16 DIAGNOSIS — R109 Unspecified abdominal pain: Secondary | ICD-10-CM

## 2015-10-16 DIAGNOSIS — Z79899 Other long term (current) drug therapy: Secondary | ICD-10-CM | POA: Diagnosis not present

## 2015-10-16 DIAGNOSIS — Z794 Long term (current) use of insulin: Secondary | ICD-10-CM | POA: Insufficient documentation

## 2015-10-16 DIAGNOSIS — I129 Hypertensive chronic kidney disease with stage 1 through stage 4 chronic kidney disease, or unspecified chronic kidney disease: Secondary | ICD-10-CM | POA: Diagnosis not present

## 2015-10-16 DIAGNOSIS — R10A0 Flank pain, unspecified side: Secondary | ICD-10-CM

## 2015-10-16 DIAGNOSIS — N390 Urinary tract infection, site not specified: Secondary | ICD-10-CM | POA: Diagnosis not present

## 2015-10-16 DIAGNOSIS — R1010 Upper abdominal pain, unspecified: Secondary | ICD-10-CM

## 2015-10-16 DIAGNOSIS — Z7982 Long term (current) use of aspirin: Secondary | ICD-10-CM | POA: Diagnosis not present

## 2015-10-16 DIAGNOSIS — R1012 Left upper quadrant pain: Secondary | ICD-10-CM | POA: Diagnosis present

## 2015-10-16 DIAGNOSIS — E669 Obesity, unspecified: Secondary | ICD-10-CM | POA: Insufficient documentation

## 2015-10-16 DIAGNOSIS — R319 Hematuria, unspecified: Secondary | ICD-10-CM

## 2015-10-16 LAB — COMPREHENSIVE METABOLIC PANEL
ALT: 15 U/L (ref 14–54)
AST: 16 U/L (ref 15–41)
Albumin: 3.9 g/dL (ref 3.5–5.0)
Alkaline Phosphatase: 90 U/L (ref 38–126)
Anion gap: 10 (ref 5–15)
BILIRUBIN TOTAL: 0.5 mg/dL (ref 0.3–1.2)
BUN: 29 mg/dL — AB (ref 6–20)
CO2: 23 mmol/L (ref 22–32)
Calcium: 9.4 mg/dL (ref 8.9–10.3)
Chloride: 102 mmol/L (ref 101–111)
Creatinine, Ser: 1.86 mg/dL — ABNORMAL HIGH (ref 0.44–1.00)
GFR, EST AFRICAN AMERICAN: 37 mL/min — AB (ref >60)
GFR, EST NON AFRICAN AMERICAN: 32 mL/min — AB (ref >60)
Glucose, Bld: 86 mg/dL (ref 65–99)
POTASSIUM: 3.6 mmol/L (ref 3.5–5.1)
Sodium: 135 mmol/L (ref 135–145)
TOTAL PROTEIN: 7.4 g/dL (ref 6.5–8.1)

## 2015-10-16 LAB — URINALYSIS, ROUTINE W REFLEX MICROSCOPIC
Bilirubin Urine: NEGATIVE
GLUCOSE, UA: NEGATIVE mg/dL
KETONES UR: NEGATIVE mg/dL
Nitrite: NEGATIVE
PROTEIN: NEGATIVE mg/dL
Specific Gravity, Urine: <1.005 — ABNORMAL LOW (ref 1.005–1.030)
pH: 6 (ref 5.0–8.0)

## 2015-10-16 LAB — CBC WITH DIFFERENTIAL/PLATELET
BASOS ABS: 0.1 10*3/uL (ref 0.0–0.1)
BASOS PCT: 1 %
EOS ABS: 0.2 10*3/uL (ref 0.0–0.7)
Eosinophils Relative: 2 %
HCT: 41.6 % (ref 36.0–46.0)
HEMOGLOBIN: 14 g/dL (ref 12.0–15.0)
Lymphocytes Relative: 31 %
Lymphs Abs: 2.8 10*3/uL (ref 0.7–4.0)
MCH: 29.3 pg (ref 26.0–34.0)
MCHC: 33.7 g/dL (ref 30.0–36.0)
MCV: 87 fL (ref 78.0–100.0)
MONO ABS: 0.9 10*3/uL (ref 0.1–1.0)
MONOS PCT: 10 %
NEUTROS ABS: 5 10*3/uL (ref 1.7–7.7)
NEUTROS PCT: 56 %
Platelets: 249 10*3/uL (ref 150–400)
RBC: 4.78 MIL/uL (ref 3.87–5.11)
RDW: 13.4 % (ref 11.5–15.5)
WBC: 8.9 10*3/uL (ref 4.0–10.5)

## 2015-10-16 LAB — URINE MICROSCOPIC-ADD ON

## 2015-10-16 LAB — CBG MONITORING, ED: GLUCOSE-CAPILLARY: 83 mg/dL (ref 65–99)

## 2015-10-16 LAB — LIPASE, BLOOD: LIPASE: 22 U/L (ref 11–51)

## 2015-10-16 LAB — POC URINE PREG, ED: Preg Test, Ur: NEGATIVE

## 2015-10-16 LAB — TROPONIN I: Troponin I: <0.03 ng/mL (ref <0.031)

## 2015-10-16 MED ORDER — HYDROMORPHONE HCL 1 MG/ML IJ SOLN
1.0000 mg | Freq: Once | INTRAMUSCULAR | Status: AC
  Administered 2015-10-16: 1 mg via INTRAVENOUS
  Filled 2015-10-16: qty 1

## 2015-10-16 MED ORDER — DIATRIZOATE MEGLUMINE & SODIUM 66-10 % PO SOLN
ORAL | Status: AC
  Filled 2015-10-16: qty 30

## 2015-10-16 MED ORDER — ONDANSETRON HCL 4 MG/2ML IJ SOLN
4.0000 mg | Freq: Once | INTRAMUSCULAR | Status: AC
  Administered 2015-10-16: 4 mg via INTRAVENOUS
  Filled 2015-10-16: qty 2

## 2015-10-16 MED ORDER — OXYCODONE-ACETAMINOPHEN 5-325 MG PO TABS: 1.0000 | ORAL_TABLET | Freq: Four times a day (QID) | ORAL | Status: DC | PRN

## 2015-10-16 MED ORDER — DEXTROSE 5 % IV SOLN
1.0000 g | Freq: Once | INTRAVENOUS | Status: AC
  Administered 2015-10-16: 1 g via INTRAVENOUS
  Filled 2015-10-16: qty 10

## 2015-10-16 MED ORDER — SODIUM CHLORIDE 0.9 % IV BOLUS (SEPSIS)
1000.0000 mL | Freq: Once | INTRAVENOUS | Status: AC
  Administered 2015-10-16: 1000 mL via INTRAVENOUS
  Filled 2015-10-16: qty 1000

## 2015-10-16 MED ORDER — CEPHALEXIN 500 MG PO CAPS: 500.0000 mg | ORAL_CAPSULE | Freq: Four times a day (QID) | ORAL | Status: DC

## 2015-10-16 MED ORDER — MORPHINE SULFATE (PF) 4 MG/ML IV SOLN
4.0000 mg | Freq: Once | INTRAVENOUS | Status: AC
  Administered 2015-10-16: 4 mg via INTRAVENOUS
  Filled 2015-10-16: qty 1

## 2015-10-16 MED ORDER — ONDANSETRON 4 MG PO TBDP: 4.0000 mg | ORAL_TABLET | Freq: Three times a day (TID) | ORAL | Status: DC | PRN

## 2015-10-16 NOTE — Discharge Instructions (Signed)
Flank Pain Flank pain refers to pain that is located on the side of the body between the upper abdomen and the back. The pain may occur over a short period of time (acute) or may be long-term or reoccurring (chronic). It may be mild or severe. Flank pain can be caused by many things. CAUSES  Some of the more common causes of flank pain include:  Muscle strains.   Muscle spasms.   A disease of your spine (vertebral disk disease).   A lung infection (pneumonia).   Fluid around your lungs (pulmonary edema).   A kidney infection.   Kidney stones.   A very painful skin rash caused by the chickenpox virus (shingles).   Gallbladder disease.  [place] care will depend on the cause of your pain. In general,  Rest as directed by your caregiver.  Drink enough fluids to keep your urine clear or pale yellow.  Only take over-the-counter or prescription medicines as directed by your caregiver. Some medicines may help relieve the pain.  Tell your caregiver about any changes in your pain.  Follow up with your caregiver as directed. SEEK IMMEDIATE MEDICAL CARE IF:   Your pain is not controlled with medicine.   You have new or worsening symptoms.  Your pain increases.   You have abdominal pain.   You have shortness of breath.   You have persistent nausea or vomiting.   You have swelling in your abdomen.   You feel faint or pass out.   You have blood in your urine.  You have a fever or persistent symptoms for more than 2-3 days.  You have a fever and your symptoms suddenly get worse. MAKE SURE YOU:   Understand these instructions.  Will watch your condition.  Will get help right away if you are not doing well or get worse.   This information is not intended to replace advice given to you by your health care provider. Make sure you discuss any questions you have with your health care provider.   Document Released: 07/15/2005 Document  Revised: 02/16/2012 Document Reviewed: 01/06/2012 Elsevier Interactive Patient Education 2016 Elsevier Inc.  Urinary Tract Infection Urinary tract infections (UTIs) can develop anywhere along your urinary tract. Your urinary tract is your body's drainage system for removing wastes and extra water. Your urinary tract includes two kidneys, two ureters, a bladder, and a urethra. Your kidneys are a pair of bean-shaped organs. Each kidney is about the size of your fist. They are located below your ribs, one on each side of your spine. CAUSES Infections are caused by microbes, which are microscopic organisms, including fungi, viruses, and bacteria. These organisms are so small that they can only be seen through a microscope. Bacteria are the microbes that most commonly cause UTIs. SYMPTOMS  Symptoms of UTIs may vary by age and gender of the patient and by the location of the infection. Symptoms in young women typically include a frequent and intense urge to urinate and a painful, burning feeling in the bladder or urethra during urination. Older women and men are more likely to be tired, shaky, and weak and have muscle aches and abdominal pain. A fever may mean the infection is in your kidneys. Other symptoms of a kidney infection include pain in your back or sides below the ribs, nausea, and vomiting. DIAGNOSIS To diagnose a UTI, your caregiver will ask you about your symptoms. Your caregiver will also ask you to provide a urine sample. The  urine sample will be tested for bacteria and white blood cells. White blood cells are made by your body to help fight infection. TREATMENT  Typically, UTIs can be treated with medication. Because most UTIs are caused by a bacterial infection, they usually can be treated with the use of antibiotics. The choice of antibiotic and length of treatment depend on your symptoms and the type of bacteria causing your infection. HOME CARE INSTRUCTIONS  If you were prescribed  antibiotics, take them exactly as your caregiver instructs you. Finish the medication even if you feel better after you have only taken some of the medication.  Drink enough water and fluids to keep your urine clear or pale yellow.  Avoid caffeine, tea, and carbonated beverages. They tend to irritate your bladder.  Empty your bladder often. Avoid holding urine for long periods of time.  Empty your bladder before and after sexual intercourse.  After a bowel movement, women should cleanse from front to back. Use each tissue only once. SEEK MEDICAL CARE IF:   You have back pain.  You develop a fever.  Your symptoms do not begin to resolve within 3 days. SEEK IMMEDIATE MEDICAL CARE IF:   You have severe back pain or lower abdominal pain.  You develop chills.  You have nausea or vomiting.  You have continued burning or discomfort with urination. MAKE SURE YOU:   Understand these instructions.  Will watch your condition.  Will get help right away if you are not doing well or get worse.   This information is not intended to replace advice given to you by your health care provider. Make sure you discuss any questions you have with your health care provider.   Document Released: 03/03/2005 Document Revised: 02/12/2015 Document Reviewed: 07/02/2011 Elsevier Interactive Patient Education [employer].

## 2015-10-16 NOTE — ED Provider Notes (Signed)
By signing my name below, I, Jasmine [name], attest that this documentation has been prepared under the direction and in the presence of [employer], DO. Electronically Signed: Randa [name], ED Scribe. 10/16/2015. 12:49 AM.  TIME SEEN: 12:42 AM  CHIEF COMPLAINT: abdominal pain  HPI: HPI Comments: Jasmine [name] is a 45 y.o. female PMHx of anxiety, depression, bipolar disorder, HTN, HLD, DM, and CKD who presents to the Emergency Department complaining of upper left sided abdominal pain and left flank pain onset 1 week prior. Reports associated nausea and chills. Pt states that she has been taking muscle relaxants with some relief. Pt reports some relief with different position changes. Pt denies any worsening factors. Denies fever, CP, SOB diarrhea, hematuria, dysuria or vaginal discharge or abnormal bleeding. Denies Hx abdominal surgeries. Denies alcohol use. Denies frequent NSAID use. Has never had similar symptoms. No history of kidney stone. States her last visit. Was approximately one month ago.  ROS: See HPI Constitutional: no fever  Eyes: no drainage  ENT: no runny nose   Cardiovascular:  no chest pain  Resp: no SOB  GI: no vomiting GU: no dysuria Integumentary: no rash  Allergy: no hives  Musculoskeletal: no leg swelling  Neurological: no slurred speech ROS otherwise negative  PAST MEDICAL HISTORY/PAST SURGICAL HISTORY:  Past Medical History  Diagnosis Date  . Hypertension   . Diabetes mellitus   . Bipolar disorder ([place])   . CKD (chronic kidney disease), stage II   . Elevated cholesterol   . GERD (gastroesophageal reflux disease)   . Stress incontinence   . Hypothyroidism   . Sleep apnea     HAS C -PAP / DOES NOT USE  . Anxiety   . Depression   . UTI (lower urinary tract infection)   . Tobacco abuse   . Obesity   . DKA, type 1 ([place]) 11/04/2011    MEDICATIONS:  Prior to Admission medications   Medication Sig Start Date End Date Taking? Authorizing  Provider  acetaminophen (TYLENOL) 500 MG tablet Take 1,000 mg by mouth every 6 (six) hours as needed. For pain   Yes Historical Provider, MD  aspirin EC 81 MG tablet Take 81 mg by mouth daily.   Yes Historical Provider, MD  busPIRone (BUSPAR) 10 MG tablet Take 10 mg by mouth 3 (three) times daily.   Yes Historical Provider, MD  carvedilol (COREG) 25 MG tablet Take 37.5 mg by mouth 2 (two) times daily with a meal.    Yes Historical Provider, MD  cholecalciferol (VITAMIN D) 1000 UNITS tablet Take 1,000 Units by mouth daily.   Yes Historical Provider, MD  fish oil-omega-3 fatty acids 1000 MG capsule Take 1 g by mouth daily.   Yes Historical Provider, MD  fluticasone (FLONASE) 50 MCG/ACT nasal spray Place 2 sprays into the nose daily as needed for allergies.   Yes Historical Provider, MD  folic acid (FOLVITE) Q000111Q MCG tablet Take 800 mcg by mouth daily.   Yes Historical Provider, MD  furosemide (LASIX) 40 MG tablet Take 40 mg by mouth 2 (two) times daily.    Yes Historical Provider, MD  insulin aspart (NOVOLOG) 100 UNIT/ML injection Inject into the skin continuous. Daily Bolus of 2.9 units every hour   Yes Historical Provider, MD  lamoTRIgine (LAMICTAL) 100 MG tablet Take 50 mg by mouth every morning.    Yes Historical Provider, MD  levothyroxine (SYNTHROID, LEVOTHROID) 150 MCG tablet Take 150 mcg by mouth daily before breakfast.   Yes Historical  Provider, MD  medroxyPROGESTERone (PROVERA) 10 MG tablet Take 20 mg by mouth daily.   Yes Historical Provider, MD  pravastatin (PRAVACHOL) 80 MG tablet Take 80 mg by mouth at bedtime.   Yes Historical Provider, MD  promethazine (PHENERGAN) 12.5 MG tablet Take 12.5 mg by mouth every 6 (six) hours as needed for nausea or vomiting.   Yes Historical Provider, MD  ranitidine (ZANTAC) 150 MG tablet Take 150 mg by mouth at bedtime.   Yes Historical Provider, MD  tiZANidine (ZANAFLEX) 4 MG capsule Take 4 mg by mouth 3 (three) times daily.   Yes Historical Provider, MD   albuterol (PROVENTIL HFA;VENTOLIN HFA) 108 (90 BASE) MCG/ACT inhaler Inhale 2 puffs into the lungs every 6 (six) hours as needed for wheezing.    Historical Provider, MD  LORazepam (ATIVAN) 1 MG tablet Take 1 tablet (1 mg total) by mouth every 8 (eight) hours as needed for anxiety. 10/01/15   Merryl Hacker, MD    ALLERGIES:  Allergies  Allergen Reactions  . Advair Diskus [Fluticasone-Salmeterol] Other (See Comments)    Thrush   . Biaxin [Clarithromycin] Rash    SOCIAL HISTORY:  Social History  Substance Use Topics  . Smoking status: Former Smoker -- 0.50 packs/day for 20 years    Types: Cigarettes  . Smokeless tobacco: Not on file  . Alcohol Use: No    FAMILY HISTORY: Family History  Problem Relation Age of Onset  . Asthma Mother   . Heart disease Father   . Cancer Paternal Grandmother     lung and breast  . Bladder Cancer Paternal Grandfather     EXAM: BP 154/70 mmHg  Pulse 71  Temp(Src) 98.2 F (36.8 C) (Oral)  Resp 18  Ht 5\' 9"  (1.753 m)  Wt 297 lb (134.718 kg)  BMI 43.84 kg/m2  SpO2 97%  LMP 09/17/2015   CONSTITUTIONAL: Alert and oriented and responds appropriately to questions. Appears uncomfortable; well-nourished; obese HEAD: Normocephalic EYES: Conjunctivae clear, PERRL ENT: normal nose; no rhinorrhea; moist mucous membranes NECK: Supple, no meningismus, no LAD  CARD: RRR; S1 and S2 appreciated; no murmurs, no clicks, no rubs, no gallops RESP: Normal chest excursion without splinting or tachypnea; breath sounds clear and equal bilaterally; no wheezes, no rhonchi, no rales, no hypoxia or respiratory distress, speaking full sentences ABD/GI: Normal bowel sounds; non-distended; soft, diffusely tender throughout upper abdomenWorse in the left upper quadrant, no rebound, no guarding, no peritoneal signs BACK:  The back appears normal and is non-tender to palpation, there is no CVA tenderness EXT: Normal ROM in all joints; non-tender to palpation; no edema;  normal capillary refill; no cyanosis, no calf tenderness or swelling    SKIN: Normal color for age and race; warm; no rash NEURO: Moves all extremities equally, sensation to light touch intact diffusely, cranial nerves II through XII intact PSYCH: The patient's mood and manner are appropriate. Grooming and personal hygiene are appropriate.  MEDICAL DECISION MAKING: Patient here with abdominal pain and flank pain. Differential diagnosis includes pyelonephritis, kidney stone, UTI, pancreatitis, gastritis. We'll obtain labs, urine and obtain a CT of her abdomen and pelvis. We'll give IV fluids, pain and nausea medicine.  ED PROGRESS: Patient's labs show chronic kidney disease which is stable. Urine shows small hemoglobin, small leukocytes and rare bacteria. She is not pregnant. CT scan shows mild bilateral renal parenchymal atrophy without hydronephrosis or hydroureter. No nephrolithiasis appreciated. She has a mild bilateral perinephric stranding is nonspecific and thought to be chronic. However given  she does have pain on the left side and has possible mild urinary tract infection, we will treat this. Will give dose of ceftriaxone and discharge patient on Keflex. We'll give her another dose of pain and nausea medicine in the ED. I do feel she is safe to be discharged home. She has a PCP for follow-up. She is comfortable with this plan.   At this time, I do not feel there is any life-threatening condition present. I have reviewed and discussed all results (EKG, imaging, lab, urine as appropriate), exam findings with patient. I have reviewed nursing notes and appropriate previous records.  I feel the patient is safe to be discharged home without further emergent workup. Discussed usual and customary return precautions. Patient and family (if present) verbalize understanding and are comfortable with this plan.  Patient will follow-up with their primary care provider. If they do not have a primary care  provider, information for follow-up has been provided to them. All questions have been answered.     EKG Interpretation  Date/Time:  Thursday Oct 16 2015 01:12:22 EDT Ventricular Rate:  71 PR Interval:  132 QRS Duration: 102 QT Interval:  407 QTC Calculation: 442 R Axis:   3 Text Interpretation:  Normal sinus rhythm Low voltage, precordial leads No significant change since last tracing Confirmed by Heaven Wandell,  DO, Doreene Forrey [phone]) on 10/16/2015 1:20:16 AM       I personally performed the services described in this documentation, which was scribed in my presence. The recorded information has been reviewed and is accurate.    [place], DO 10/16/15 [phone]

## 2015-10-16 NOTE — ED Notes (Signed)
abd pain, no vomiting or diarrhea.

## 2015-10-16 NOTE — ED Notes (Signed)
Pt c/o her heart "feeling funny"; in to observe pt; PVCs noted on monitor; pt's pain has decreased

## 2015-10-17 LAB — URINE CULTURE

## 2015-10-26 ENCOUNTER — Emergency Department (HOSPITAL_COMMUNITY): Payer: BLUE CROSS/BLUE SHIELD

## 2015-10-26 ENCOUNTER — Emergency Department (HOSPITAL_COMMUNITY)
Admission: EM | Admit: 2015-10-26 | Discharge: 2015-10-26 | Disposition: A | Discharge status: Home / Self Care | Payer: BLUE CROSS/BLUE SHIELD | Attending: Emergency Medicine | Admitting: Emergency Medicine

## 2015-10-26 ENCOUNTER — Encounter (HOSPITAL_COMMUNITY): Payer: Self-pay | Admitting: Emergency Medicine

## 2015-10-26 DIAGNOSIS — I129 Hypertensive chronic kidney disease with stage 1 through stage 4 chronic kidney disease, or unspecified chronic kidney disease: Secondary | ICD-10-CM | POA: Insufficient documentation

## 2015-10-26 DIAGNOSIS — E039 Hypothyroidism, unspecified: Secondary | ICD-10-CM | POA: Insufficient documentation

## 2015-10-26 DIAGNOSIS — N182 Chronic kidney disease, stage 2 (mild): Secondary | ICD-10-CM | POA: Insufficient documentation

## 2015-10-26 DIAGNOSIS — Z7982 Long term (current) use of aspirin: Secondary | ICD-10-CM | POA: Diagnosis not present

## 2015-10-26 DIAGNOSIS — Z79899 Other long term (current) drug therapy: Secondary | ICD-10-CM | POA: Insufficient documentation

## 2015-10-26 DIAGNOSIS — F329 Major depressive disorder, single episode, unspecified: Secondary | ICD-10-CM | POA: Insufficient documentation

## 2015-10-26 DIAGNOSIS — R1012 Left upper quadrant pain: Secondary | ICD-10-CM | POA: Insufficient documentation

## 2015-10-26 DIAGNOSIS — Z87891 Personal history of nicotine dependence: Secondary | ICD-10-CM | POA: Diagnosis not present

## 2015-10-26 DIAGNOSIS — Z794 Long term (current) use of insulin: Secondary | ICD-10-CM | POA: Diagnosis not present

## 2015-10-26 DIAGNOSIS — E119 Type 2 diabetes mellitus without complications: Secondary | ICD-10-CM | POA: Diagnosis not present

## 2015-10-26 DIAGNOSIS — R109 Unspecified abdominal pain: Secondary | ICD-10-CM

## 2015-10-26 LAB — CBC WITH DIFFERENTIAL/PLATELET
BASOS ABS: 0.1 10*3/uL (ref 0.0–0.1)
Basophils Relative: 1 %
EOS PCT: 1 %
Eosinophils Absolute: 0.1 10*3/uL (ref 0.0–0.7)
HEMATOCRIT: 42 % (ref 36.0–46.0)
Hemoglobin: 14.4 g/dL (ref 12.0–15.0)
LYMPHS ABS: 2.3 10*3/uL (ref 0.7–4.0)
LYMPHS PCT: 40 %
MCH: 29.9 pg (ref 26.0–34.0)
MCHC: 34.3 g/dL (ref 30.0–36.0)
MCV: 87.1 fL (ref 78.0–100.0)
MONO ABS: 0.6 10*3/uL (ref 0.1–1.0)
Monocytes Relative: 10 %
NEUTROS ABS: 2.8 10*3/uL (ref 1.7–7.7)
Neutrophils Relative %: 48 %
PLATELETS: 258 10*3/uL (ref 150–400)
RBC: 4.82 MIL/uL (ref 3.87–5.11)
RDW: 13.5 % (ref 11.5–15.5)
WBC: 5.8 10*3/uL (ref 4.0–10.5)

## 2015-10-26 LAB — COMPREHENSIVE METABOLIC PANEL
ALT: 26 U/L (ref 14–54)
AST: 24 U/L (ref 15–41)
Albumin: 4.2 g/dL (ref 3.5–5.0)
Alkaline Phosphatase: 103 U/L (ref 38–126)
Anion gap: 10 (ref 5–15)
BILIRUBIN TOTAL: 0.6 mg/dL (ref 0.3–1.2)
BUN: 19 mg/dL (ref 6–20)
CO2: 24 mmol/L (ref 22–32)
CREATININE: 1.71 mg/dL — AB (ref 0.44–1.00)
Calcium: 9.4 mg/dL (ref 8.9–10.3)
Chloride: 99 mmol/L — ABNORMAL LOW (ref 101–111)
GFR, EST AFRICAN AMERICAN: 41 mL/min — AB (ref >60)
GFR, EST NON AFRICAN AMERICAN: 36 mL/min — AB (ref >60)
Glucose, Bld: 201 mg/dL — ABNORMAL HIGH (ref 65–99)
POTASSIUM: 4 mmol/L (ref 3.5–5.1)
Sodium: 133 mmol/L — ABNORMAL LOW (ref 135–145)
TOTAL PROTEIN: 7.5 g/dL (ref 6.5–8.1)

## 2015-10-26 LAB — POC URINE PREG, ED: PREG TEST UR: NEGATIVE

## 2015-10-26 LAB — LIPASE, BLOOD: LIPASE: 22 U/L (ref 11–51)

## 2015-10-26 LAB — POC OCCULT BLOOD, ED: Fecal Occult Bld: NEGATIVE

## 2015-10-26 MED ORDER — FENTANYL CITRATE (PF) 100 MCG/2ML IJ SOLN
50.0000 ug | Freq: Once | INTRAMUSCULAR | Status: AC
  Administered 2015-10-26: 50 ug via INTRAVENOUS
  Filled 2015-10-26: qty 2

## 2015-10-26 NOTE — ED Provider Notes (Signed)
CSN: [id number]     Arrival date & time 10/26/15  1748 History   First MD Initiated Contact with Patient 10/26/15 1756     Chief Complaint  Patient presents with  . Abdominal Pain     (Consider location/radiation/quality/duration/timing/severity/associated sxs/prior Treatment) HPI.Marland KitchenMarland KitchenLeft upper quadrant greater than right upper quadrant pain with radiation to the back for several days. CT of abdomen pelvis on 10/16/15 showed no acute findings. She was treated for a urinary tract infection at that time with Keflex. She has several health problems including type 1 diabetes, hypertension, chronic kidney disease, hypothyroidism, obesity.  Severity of pain is moderate. Nothing makes symptoms better or worse  Past Medical History  Diagnosis Date  . Hypertension   . Diabetes mellitus   . Bipolar disorder ([place])   . CKD (chronic kidney disease), stage II   . Elevated cholesterol   . GERD (gastroesophageal reflux disease)   . Stress incontinence   . Hypothyroidism   . Sleep apnea     HAS C -PAP / DOES NOT USE  . Anxiety   . Depression   . UTI (lower urinary tract infection)   . Tobacco abuse   . Obesity   . DKA, type 1 ([place]) 11/04/2011   Past Surgical History  Procedure Laterality Date  . Ovary removed    . Nasal fracture surgery    . Uterine fibroid surgery  2001  . Pubovaginal sling  08/16/2011    Procedure: [name];  Surgeon: Bernestine Amass, MD;  Location: [facility];  Service: Urology;  Laterality: N/A;         . Incontinence surgery     Family History  Problem Relation Age of Onset  . Asthma Mother   . Heart disease Father   . Cancer Paternal Grandmother     lung and breast  . Bladder Cancer Paternal Grandfather    Social History  Substance Use Topics  . Smoking status: Former Smoker -- 0.50 packs/day for 20 years    Types: Cigarettes  . Smokeless tobacco: None  . Alcohol Use: No   OB History    No data available     Review of Systems  All other systems  reviewed and are negative.     Allergies  Advair diskus; Ondansetron; Biaxin; and Hydroxyzine  Home Medications   Prior to Admission medications   Medication Sig Start Date End Date Taking? Authorizing Provider  acetaminophen (TYLENOL) 500 MG tablet Take 1,000 mg by mouth every 6 (six) hours as needed. For pain   Yes Historical Provider, MD  aspirin EC 81 MG tablet Take 81 mg by mouth daily.   Yes Historical Provider, MD  busPIRone (BUSPAR) 10 MG tablet Take 10 mg by mouth 2 (two) times daily.    Yes Historical Provider, MD  carvedilol (COREG) 25 MG tablet Take 37.5 mg by mouth 2 (two) times daily with a meal.    Yes Historical Provider, MD  cholecalciferol (VITAMIN D) 1000 UNITS tablet Take 1,000 Units by mouth daily.   Yes Historical Provider, MD  fish oil-omega-3 fatty acids 1000 MG capsule Take 1 g by mouth daily.   Yes Historical Provider, MD  fluticasone (FLONASE) 50 MCG/ACT nasal spray Place 2 sprays into the nose daily as needed for allergies.   Yes Historical Provider, MD  folic acid (FOLVITE) Q000111Q MCG tablet Take 800 mcg by mouth daily.   Yes Historical Provider, MD  furosemide (LASIX) 40 MG tablet Take 40 mg by mouth 2 (two)  times daily.    Yes Historical Provider, MD  insulin aspart (NOVOLOG) 100 UNIT/ML injection Inject into the skin continuous. Daily Bolus of 2.9 units every hour   Yes Historical Provider, MD  lamoTRIgine (LAMICTAL) 100 MG tablet Take 50 mg by mouth every morning.    Yes Historical Provider, MD  levothyroxine (SYNTHROID, LEVOTHROID) 150 MCG tablet Take 150 mcg by mouth daily before breakfast.   Yes Historical Provider, MD  polyethylene glycol (MIRALAX / GLYCOLAX) packet Take 17 g by mouth daily.   Yes Historical Provider, MD  pravastatin (PRAVACHOL) 80 MG tablet Take 80 mg by mouth at bedtime.   Yes Historical Provider, MD  promethazine (PHENERGAN) 12.5 MG tablet Take 12.5 mg by mouth every 6 (six) hours as needed for nausea or vomiting.   Yes Historical  Provider, MD  ranitidine (ZANTAC) 150 MG tablet Take 150 mg by mouth at bedtime.   Yes Historical Provider, MD  tiZANidine (ZANAFLEX) 4 MG capsule Take 4 mg by mouth 3 (three) times daily.   Yes Historical Provider, MD  cephALEXin (KEFLEX) 500 MG capsule Take 1 capsule (500 mg total) by mouth 4 (four) times daily. Patient not taking: Reported on 10/26/2015 10/16/15   Kristen N Ward, DO  LORazepam (ATIVAN) 1 MG tablet Take 1 tablet (1 mg total) by mouth every 8 (eight) hours as needed for anxiety. 10/01/15   Merryl Hacker, MD  medroxyPROGESTERone (PROVERA) 10 MG tablet Take 20 mg by mouth as directed. Only takes the first 5 days of the month    Historical Provider, MD  ondansetron (ZOFRAN ODT) 4 MG disintegrating tablet Take 1 tablet (4 mg total) by mouth every 8 (eight) hours as needed for nausea or vomiting. 10/16/15   Delice Bison Ward, DO  oxyCODONE-acetaminophen (PERCOCET/ROXICET) 5-325 MG tablet Take 1-2 tablets by mouth every 6 (six) hours as needed. 10/16/15   Kristen N Ward, DO   BP 142/63 mmHg  Pulse 72  Temp(Src) 97.8 F (36.6 C) (Oral)  Resp 20  Ht 5\' 9"  (1.753 m)  Wt 297 lb (134.718 kg)  BMI 43.84 kg/m2  SpO2 97%  LMP 09/17/2015 Physical Exam  Constitutional: She is oriented to person, place, and time.  Obese, minimal distress  HENT:  Head: Normocephalic and atraumatic.  Eyes: Conjunctivae and EOM are normal. Pupils are equal, round, and reactive to light.  Neck: Normal range of motion. Neck supple.  Cardiovascular: Normal rate and regular rhythm.   Pulmonary/Chest: Effort normal and breath sounds normal.  Abdominal:  Minimal upper abdominal tenderness left upper quadrant greater than right upper quadrant  Musculoskeletal: Normal range of motion.  Neurological: She is alert and oriented to person, place, and time.  Skin: Skin is warm and dry.  Psychiatric: She has a normal mood and affect. Her behavior is normal.  Nursing note and vitals reviewed.   ED Course   Procedures (including critical care time) Labs Review Labs Reviewed  COMPREHENSIVE METABOLIC PANEL - Abnormal; Notable for the following:    Sodium 133 (*)    Chloride 99 (*)    Glucose, Bld 201 (*)    Creatinine, Ser 1.71 (*)    GFR calc non Af Amer 36 (*)    GFR calc Af Amer 41 (*)    All other components within normal limits  CBC WITH DIFFERENTIAL/PLATELET  LIPASE, BLOOD  POC URINE PREG, ED  POC OCCULT BLOOD, ED  POC OCCULT BLOOD, ED    Imaging Review Dg Abd Acute W/chest  10/26/2015  CLINICAL DATA:  Upper abdominal pain and constipation.  Nausea. EXAM: DG ABDOMEN ACUTE W/ 1V CHEST COMPARISON:  10/16/2015 CT scan of the abdomen FINDINGS: The lungs appear clear.  Cardiac and mediastinal contours normal. No pleural effusion identified. No free intraperitoneal gas beneath the hemidiaphragms. Unremarkable bowel gas pattern. Thin curvilinear densities projecting over the right abdomen are thought to likely be outside of the patient. IMPRESSION: 1. Unremarkable bowel gas pattern. 2. The lungs appear clear. Electronically Signed   By: Van Clines M.D.   On: 10/26/2015 20:32   I have personally reviewed and evaluated these images and lab results as part of my medical decision-making.   EKG Interpretation None      MDM   Final diagnoses:  Abdominal pain, unspecified abdominal location    No acute abdomen. Acute abdominal series shows no bowel obstruction. White count normal. Liver functions normal. Creatinines been elevated in the past. Lipase normal. Discussed with patient and her husband. Patient has pain and nausea medications at home.    [name], MD 10/26/15 2256

## 2015-10-26 NOTE — Discharge Instructions (Signed)
Tests showed no life-threatening condition. Recommend clear liquids for the next 12-24 hours. Take your pain and nausea medication. Follow-up your primary care doctor.

## 2015-10-26 NOTE — ED Notes (Signed)
Pt states she continues to have upper abdominal pain with diarrhea.

## 2015-10-29 ENCOUNTER — Emergency Department (HOSPITAL_COMMUNITY)
Admission: EM | Admit: 2015-10-29 | Discharge: 2015-10-29 | Disposition: A | Discharge status: Home / Self Care | Payer: BLUE CROSS/BLUE SHIELD | Attending: Emergency Medicine | Admitting: Emergency Medicine

## 2015-10-29 ENCOUNTER — Encounter (HOSPITAL_COMMUNITY): Payer: Self-pay | Admitting: *Deleted

## 2015-10-29 DIAGNOSIS — R1012 Left upper quadrant pain: Secondary | ICD-10-CM | POA: Diagnosis present

## 2015-10-29 DIAGNOSIS — R002 Palpitations: Secondary | ICD-10-CM | POA: Diagnosis not present

## 2015-10-29 DIAGNOSIS — R0602 Shortness of breath: Secondary | ICD-10-CM | POA: Diagnosis not present

## 2015-10-29 DIAGNOSIS — E1122 Type 2 diabetes mellitus with diabetic chronic kidney disease: Secondary | ICD-10-CM | POA: Diagnosis not present

## 2015-10-29 DIAGNOSIS — Z7984 Long term (current) use of oral hypoglycemic drugs: Secondary | ICD-10-CM | POA: Diagnosis not present

## 2015-10-29 DIAGNOSIS — I129 Hypertensive chronic kidney disease with stage 1 through stage 4 chronic kidney disease, or unspecified chronic kidney disease: Secondary | ICD-10-CM | POA: Insufficient documentation

## 2015-10-29 DIAGNOSIS — N182 Chronic kidney disease, stage 2 (mild): Secondary | ICD-10-CM | POA: Diagnosis not present

## 2015-10-29 DIAGNOSIS — Z87891 Personal history of nicotine dependence: Secondary | ICD-10-CM | POA: Insufficient documentation

## 2015-10-29 DIAGNOSIS — Z7982 Long term (current) use of aspirin: Secondary | ICD-10-CM | POA: Insufficient documentation

## 2015-10-29 DIAGNOSIS — E039 Hypothyroidism, unspecified: Secondary | ICD-10-CM | POA: Diagnosis not present

## 2015-10-29 DIAGNOSIS — F329 Major depressive disorder, single episode, unspecified: Secondary | ICD-10-CM | POA: Insufficient documentation

## 2015-10-29 DIAGNOSIS — R109 Unspecified abdominal pain: Secondary | ICD-10-CM

## 2015-10-29 LAB — URINALYSIS, ROUTINE W REFLEX MICROSCOPIC
BILIRUBIN URINE: NEGATIVE
Glucose, UA: NEGATIVE mg/dL
KETONES UR: NEGATIVE mg/dL
NITRITE: NEGATIVE
SPECIFIC GRAVITY, URINE: 1.01 (ref 1.005–1.030)
pH: 8 (ref 5.0–8.0)

## 2015-10-29 LAB — CBC
HCT: 41.3 % (ref 36.0–46.0)
HEMOGLOBIN: 14 g/dL (ref 12.0–15.0)
MCH: 29.7 pg (ref 26.0–34.0)
MCHC: 33.9 g/dL (ref 30.0–36.0)
MCV: 87.5 fL (ref 78.0–100.0)
PLATELETS: 251 10*3/uL (ref 150–400)
RBC: 4.72 MIL/uL (ref 3.87–5.11)
RDW: 13.4 % (ref 11.5–15.5)
WBC: 6.5 10*3/uL (ref 4.0–10.5)

## 2015-10-29 LAB — COMPREHENSIVE METABOLIC PANEL
ALBUMIN: 4.1 g/dL (ref 3.5–5.0)
ALK PHOS: 102 U/L (ref 38–126)
ALT: 26 U/L (ref 14–54)
ANION GAP: 9 (ref 5–15)
AST: 24 U/L (ref 15–41)
BILIRUBIN TOTAL: 0.5 mg/dL (ref 0.3–1.2)
BUN: 17 mg/dL (ref 6–20)
CALCIUM: 9.4 mg/dL (ref 8.9–10.3)
CO2: 25 mmol/L (ref 22–32)
CREATININE: 1.72 mg/dL — AB (ref 0.44–1.00)
Chloride: 99 mmol/L — ABNORMAL LOW (ref 101–111)
GFR calc Af Amer: 41 mL/min — ABNORMAL LOW (ref >60)
GFR calc non Af Amer: 35 mL/min — ABNORMAL LOW (ref >60)
GLUCOSE: 173 mg/dL — AB (ref 65–99)
Potassium: 4.2 mmol/L (ref 3.5–5.1)
Sodium: 133 mmol/L — ABNORMAL LOW (ref 135–145)
TOTAL PROTEIN: 7.3 g/dL (ref 6.5–8.1)

## 2015-10-29 LAB — URINE MICROSCOPIC-ADD ON

## 2015-10-29 LAB — POC URINE PREG, ED: PREG TEST UR: NEGATIVE

## 2015-10-29 LAB — LIPASE, BLOOD: Lipase: 16 U/L (ref 11–51)

## 2015-10-29 MED ORDER — LORAZEPAM 1 MG PO TABS
1.0000 mg | ORAL_TABLET | Freq: Once | ORAL | Status: AC
  Administered 2015-10-29: 1 mg via ORAL
  Filled 2015-10-29: qty 1

## 2015-10-29 MED ORDER — DICYCLOMINE HCL 10 MG PO CAPS: 10.0000 mg | ORAL_CAPSULE | Freq: Three times a day (TID) | ORAL | Status: DC | PRN

## 2015-10-29 MED ORDER — CIPROFLOXACIN HCL 250 MG PO TABS
500.0000 mg | ORAL_TABLET | Freq: Once | ORAL | Status: AC
  Administered 2015-10-29: 500 mg via ORAL
  Filled 2015-10-29: qty 2

## 2015-10-29 MED ORDER — DICYCLOMINE HCL 10 MG PO CAPS
10.0000 mg | ORAL_CAPSULE | Freq: Once | ORAL | Status: AC
  Administered 2015-10-29: 10 mg via ORAL
  Filled 2015-10-29: qty 1

## 2015-10-29 MED ORDER — SODIUM CHLORIDE 0.9 % IV BOLUS (SEPSIS)
1000.0000 mL | Freq: Once | INTRAVENOUS | Status: AC
  Administered 2015-10-29: 1000 mL via INTRAVENOUS

## 2015-10-29 MED ORDER — CIPROFLOXACIN HCL 500 MG PO TABS: 500.0000 mg | ORAL_TABLET | Freq: Two times a day (BID) | ORAL | Status: DC

## 2015-10-29 NOTE — ED Notes (Signed)
Call to lab regarding urine culture

## 2015-10-29 NOTE — ED Provider Notes (Signed)
CSN: [id number]     Arrival date & time 10/29/15  0307 History   None    Chief Complaint  Patient presents with  . Abdominal Pain     (Consider location/radiation/quality/duration/timing/severity/associated sxs/prior Treatment) Patient is a 45 y.o. female presenting with abdominal pain.  Abdominal Pain Pain location:  LUQ and RUQ Pain quality: aching and pressure   Pain radiates to:  Back Pain severity:  Mild Onset quality:  Gradual Timing:  Constant Chronicity:  Recurrent Associated symptoms: cough and shortness of breath   Associated symptoms: no constipation, no diarrhea, no fatigue, no fever and no nausea     Past Medical History  Diagnosis Date  . Hypertension   . Diabetes mellitus   . Bipolar disorder ([place])   . CKD (chronic kidney disease), stage II   . Elevated cholesterol   . GERD (gastroesophageal reflux disease)   . Stress incontinence   . Hypothyroidism   . Sleep apnea     HAS C -PAP / DOES NOT USE  . Anxiety   . Depression   . UTI (lower urinary tract infection)   . Tobacco abuse   . Obesity   . DKA, type 1 ([place]) 11/04/2011   Past Surgical History  Procedure Laterality Date  . Ovary removed    . Nasal fracture surgery    . Uterine fibroid surgery  2001  . Pubovaginal sling  08/16/2011    Procedure: [name];  Surgeon: Bernestine Amass, MD;  Location: [facility];  Service: Urology;  Laterality: N/A;         . Incontinence surgery     Family History  Problem Relation Age of Onset  . Asthma Mother   . Heart disease Father   . Cancer Paternal Grandmother     lung and breast  . Bladder Cancer Paternal Grandfather    Social History  Substance Use Topics  . Smoking status: Former Smoker -- 0.50 packs/day for 20 years    Types: Cigarettes  . Smokeless tobacco: None  . Alcohol Use: No   OB History    No data available     Review of Systems  Constitutional: Negative for fever and fatigue.  Respiratory: Positive for cough and shortness  of breath.   Gastrointestinal: Positive for abdominal pain. Negative for nausea, diarrhea and constipation.  Psychiatric/Behavioral: The patient is nervous/anxious.   All other systems reviewed and are negative.     Allergies  Advair diskus; Ondansetron; Biaxin; and Hydroxyzine  Home Medications   Prior to Admission medications   Medication Sig Start Date End Date Taking? Authorizing Provider  acetaminophen (TYLENOL) 500 MG tablet Take 1,000 mg by mouth every 6 (six) hours as needed. For pain    Historical Provider, MD  aspirin EC 81 MG tablet Take 81 mg by mouth daily.    Historical Provider, MD  busPIRone (BUSPAR) 10 MG tablet Take 10 mg by mouth 2 (two) times daily.     Historical Provider, MD  carvedilol (COREG) 25 MG tablet Take 37.5 mg by mouth 2 (two) times daily with a meal.     Historical Provider, MD  cholecalciferol (VITAMIN D) 1000 UNITS tablet Take 1,000 Units by mouth daily.    Historical Provider, MD  ciprofloxacin (CIPRO) 500 MG tablet Take 1 tablet (500 mg total) by mouth 2 (two) times daily. 10/29/15   Merrily Pew, MD  dicyclomine (BENTYL) 10 MG capsule Take 1 capsule (10 mg total) by mouth 3 (three) times daily as needed (abdominal  pain). 10/29/15   Merrily Pew, MD  fish oil-omega-3 fatty acids 1000 MG capsule Take 1 g by mouth daily.    Historical Provider, MD  fluticasone (FLONASE) 50 MCG/ACT nasal spray Place 2 sprays into the nose daily as needed for allergies.    Historical Provider, MD  folic acid (FOLVITE) Q000111Q MCG tablet Take 800 mcg by mouth daily.    Historical Provider, MD  furosemide (LASIX) 40 MG tablet Take 40 mg by mouth 2 (two) times daily.     Historical Provider, MD  insulin aspart (NOVOLOG) 100 UNIT/ML injection Inject into the skin continuous. Daily Bolus of 2.9 units every hour    Historical Provider, MD  lamoTRIgine (LAMICTAL) 100 MG tablet Take 50 mg by mouth every morning.     Historical Provider, MD  levothyroxine (SYNTHROID, LEVOTHROID) 150 MCG  tablet Take 150 mcg by mouth daily before breakfast.    Historical Provider, MD  LORazepam (ATIVAN) 1 MG tablet Take 1 tablet (1 mg total) by mouth every 8 (eight) hours as needed for anxiety. 10/01/15   Merryl Hacker, MD  medroxyPROGESTERone (PROVERA) 10 MG tablet Take 20 mg by mouth as directed. Only takes the first 5 days of the month    Historical Provider, MD  ondansetron (ZOFRAN ODT) 4 MG disintegrating tablet Take 1 tablet (4 mg total) by mouth every 8 (eight) hours as needed for nausea or vomiting. 10/16/15   Delice Bison Ward, DO  oxyCODONE-acetaminophen (PERCOCET/ROXICET) 5-325 MG tablet Take 1-2 tablets by mouth every 6 (six) hours as needed. 10/16/15   Kristen N Ward, DO  polyethylene glycol (MIRALAX / GLYCOLAX) packet Take 17 g by mouth daily.    Historical Provider, MD  pravastatin (PRAVACHOL) 80 MG tablet Take 80 mg by mouth at bedtime.    Historical Provider, MD  promethazine (PHENERGAN) 12.5 MG tablet Take 12.5 mg by mouth every 6 (six) hours as needed for nausea or vomiting.    Historical Provider, MD  ranitidine (ZANTAC) 150 MG tablet Take 150 mg by mouth at bedtime.    Historical Provider, MD  tiZANidine (ZANAFLEX) 4 MG capsule Take 4 mg by mouth 3 (three) times daily.    Historical Provider, MD   BP 121/46 mmHg  Pulse 66  Temp(Src) 97.6 F (36.4 C) (Oral)  Resp 15  Ht 5\' 9"  (1.753 m)  Wt 297 lb (134.718 kg)  BMI 43.84 kg/m2  SpO2 95%  LMP 09/17/2015 Physical Exam  Constitutional: She appears well-developed and well-nourished.  HENT:  Head: Normocephalic and atraumatic.  Neck: Normal range of motion.  Cardiovascular: Normal rate and regular rhythm.   Pulmonary/Chest: Effort normal. No stridor. No respiratory distress.  Abdominal: Soft. Bowel sounds are normal. She exhibits no distension.  Musculoskeletal: Normal range of motion. She exhibits no edema or tenderness.  Neurological: She is alert.  Nursing note and vitals reviewed.   ED Course  Procedures (including  critical care time) Labs Review Labs Reviewed  COMPREHENSIVE METABOLIC PANEL - Abnormal; Notable for the following:    Sodium 133 (*)    Chloride 99 (*)    Glucose, Bld 173 (*)    Creatinine, Ser 1.72 (*)    GFR calc non Af Amer 35 (*)    GFR calc Af Amer 41 (*)    All other components within normal limits  URINALYSIS, ROUTINE W REFLEX MICROSCOPIC (NOT AT [facility]) - Abnormal; Notable for the following:    Hgb urine dipstick MODERATE (*)    Protein, ur TRACE (*)  Leukocytes, UA SMALL (*)    All other components within normal limits  URINE MICROSCOPIC-ADD ON - Abnormal; Notable for the following:    Squamous Epithelial / LPF 6-30 (*)    Bacteria, UA MANY (*)    All other components within normal limits  URINE CULTURE  LIPASE, BLOOD  CBC  POC URINE PREG, ED    Imaging Review No results found. I have personally reviewed and evaluated these images and lab results as part of my medical decision-making.   EKG Interpretation   Date/Time:  Wednesday Oct 29 2015 03:18:44 EDT Ventricular Rate:  70 PR Interval:  140 QRS Duration: 101 QT Interval:  423 QTC Calculation: 456 R Axis:   28 Text Interpretation:  Ectopic atrial rhythm Low voltage, precordial leads  Probable anteroseptal infarct, old Borderline T abnormalities, inferior  leads Baseline wander in lead(s) II III aVF V2 V6 No significant change  since last tracing Confirmed by [facility] MD, Corene Cornea [phone]) on 10/29/2015  3:27:40 AM      MDM   Final diagnoses:  Abdominal pain, unspecified abdominal location  Palpitations  SOB (shortness of breath)    Abdominal pain and palpitations causing sob similar to multiple episodes before. Crying on exam, appears anxious.  Improved here with bentyl, pain meds, gi coctail and ativan. Abdomen benign throughout stay, non surgical. Labs and imaging negative.  D/w patient possibly anxiety vs IBS type symptoms and will continue following up with PCP for further workup.  Also with likely  uti, will tx w cipro  New Prescriptions: Discharge Medication List as of 10/29/2015  5:16 AM    START taking these medications   Details  ciprofloxacin (CIPRO) 500 MG tablet Take 1 tablet (500 mg total) by mouth 2 (two) times daily., Starting 10/29/2015, Until Discontinued, Print    dicyclomine (BENTYL) 10 MG capsule Take 1 capsule (10 mg total) by mouth 3 (three) times daily as needed (abdominal pain)., Starting 10/29/2015, Until Discontinued, Print         I have personally and contemperaneously reviewed labs and imaging and used in my decision making as above.   A medical screening exam was performed and I feel the patient has had an appropriate workup for their chief complaint at this time and likelihood of emergent condition existing is low and thus workup can continue on an outpatient basis.. Their vital signs are stable. They have been counseled on decision, discharge, follow up and which symptoms necessitate immediate return to the emergency department.  They verbally stated understanding and agreement with plan and discharged in stable condition.      Merrily Pew, MD 10/29/15 [phone]

## 2015-10-29 NOTE — ED Notes (Signed)
Pt is tearful and tremulous- meds as ordered, SO at bedside

## 2015-10-29 NOTE — ED Notes (Addendum)
Pt here several times this month reports "you can't find anything when I am here". Saw MD last week and was told CT showed a lot of air and stool. When asked when last normal BM was, pt reports that it was 1.5 weeks ago. She is currently taking narcotic pain med for pain.  She also reports feelings of palpitations. Monitor, EKG performed. Pt also has a hx of anxiety disorder.  Education: Sometimes EDs do not have the answer, complete workups are usually better through PCP as they can refer to GI etc. Pt then reports that she is supposed to be getting a GI referral from her PCP that she saw last week

## 2015-10-29 NOTE — ED Notes (Signed)
Pt c/o abdominal pain and palpitations

## 2015-10-29 NOTE — ED Notes (Signed)
Pt encouraged to follow up with her physician for referral as aforementioned. She is also encouraged to keep a food diary to see if her pain is associated with any type of food or particular food.  teachback for discaharge instruction, med regime and follow as well as return as needed.

## 2015-10-30 LAB — URINE CULTURE

## 2015-11-25 ENCOUNTER — Emergency Department (HOSPITAL_COMMUNITY): Payer: BLUE CROSS/BLUE SHIELD

## 2015-11-25 ENCOUNTER — Encounter (HOSPITAL_COMMUNITY): Payer: Self-pay | Admitting: Emergency Medicine

## 2015-11-25 ENCOUNTER — Other Ambulatory Visit: Payer: Self-pay

## 2015-11-25 ENCOUNTER — Emergency Department (HOSPITAL_COMMUNITY)
Admission: EM | Admit: 2015-11-25 | Discharge: 2015-11-25 | Disposition: A | Discharge status: Home / Self Care | Payer: BLUE CROSS/BLUE SHIELD | Attending: Dermatology | Admitting: Dermatology

## 2015-11-25 DIAGNOSIS — Z794 Long term (current) use of insulin: Secondary | ICD-10-CM | POA: Insufficient documentation

## 2015-11-25 DIAGNOSIS — Z87891 Personal history of nicotine dependence: Secondary | ICD-10-CM | POA: Diagnosis not present

## 2015-11-25 DIAGNOSIS — Z5321 Procedure and treatment not carried out due to patient leaving prior to being seen by health care provider: Secondary | ICD-10-CM | POA: Insufficient documentation

## 2015-11-25 DIAGNOSIS — R079 Chest pain, unspecified: Secondary | ICD-10-CM | POA: Diagnosis present

## 2015-11-25 DIAGNOSIS — F319 Bipolar disorder, unspecified: Secondary | ICD-10-CM | POA: Diagnosis not present

## 2015-11-25 DIAGNOSIS — N182 Chronic kidney disease, stage 2 (mild): Secondary | ICD-10-CM | POA: Insufficient documentation

## 2015-11-25 DIAGNOSIS — Z79899 Other long term (current) drug therapy: Secondary | ICD-10-CM | POA: Insufficient documentation

## 2015-11-25 DIAGNOSIS — E1022 Type 1 diabetes mellitus with diabetic chronic kidney disease: Secondary | ICD-10-CM | POA: Insufficient documentation

## 2015-11-25 DIAGNOSIS — I129 Hypertensive chronic kidney disease with stage 1 through stage 4 chronic kidney disease, or unspecified chronic kidney disease: Secondary | ICD-10-CM | POA: Insufficient documentation

## 2015-11-25 DIAGNOSIS — Z7982 Long term (current) use of aspirin: Secondary | ICD-10-CM | POA: Insufficient documentation

## 2015-11-25 DIAGNOSIS — E039 Hypothyroidism, unspecified: Secondary | ICD-10-CM | POA: Diagnosis not present

## 2015-11-25 LAB — CBG MONITORING, ED: Glucose-Capillary: 172 mg/dL — ABNORMAL HIGH (ref 65–99)

## 2015-11-25 NOTE — ED Notes (Signed)
Called pt to place into room and no answer x 2

## 2015-11-25 NOTE — ED Notes (Signed)
Called pt to room her for the third time and no answer

## 2015-11-25 NOTE — ED Notes (Signed)
CBG 172

## 2015-11-25 NOTE — ED Notes (Signed)
Called pt to place her in room and no answer.

## 2015-11-25 NOTE — ED Notes (Signed)
PT states middle chest tightness and upper abdominal pain with SOB and nausea but no vomiting or diarrhea that started x2 days ago.

## 2015-12-04 ENCOUNTER — Encounter (HOSPITAL_COMMUNITY): Payer: Self-pay | Admitting: Emergency Medicine

## 2015-12-04 ENCOUNTER — Emergency Department (HOSPITAL_COMMUNITY)
Admission: EM | Admit: 2015-12-04 | Discharge: 2015-12-04 | Disposition: A | Discharge status: Home / Self Care | Payer: BLUE CROSS/BLUE SHIELD | Attending: Dermatology | Admitting: Dermatology

## 2015-12-04 DIAGNOSIS — N189 Chronic kidney disease, unspecified: Secondary | ICD-10-CM | POA: Insufficient documentation

## 2015-12-04 DIAGNOSIS — Z87891 Personal history of nicotine dependence: Secondary | ICD-10-CM | POA: Diagnosis not present

## 2015-12-04 DIAGNOSIS — Z5321 Procedure and treatment not carried out due to patient leaving prior to being seen by health care provider: Secondary | ICD-10-CM | POA: Insufficient documentation

## 2015-12-04 DIAGNOSIS — E119 Type 2 diabetes mellitus without complications: Secondary | ICD-10-CM | POA: Insufficient documentation

## 2015-12-04 DIAGNOSIS — T7840XA Allergy, unspecified, initial encounter: Secondary | ICD-10-CM | POA: Diagnosis present

## 2015-12-04 DIAGNOSIS — E039 Hypothyroidism, unspecified: Secondary | ICD-10-CM | POA: Insufficient documentation

## 2015-12-04 DIAGNOSIS — F329 Major depressive disorder, single episode, unspecified: Secondary | ICD-10-CM | POA: Diagnosis not present

## 2015-12-04 DIAGNOSIS — I129 Hypertensive chronic kidney disease with stage 1 through stage 4 chronic kidney disease, or unspecified chronic kidney disease: Secondary | ICD-10-CM | POA: Diagnosis not present

## 2015-12-04 HISTORY — DX: Irritable bowel syndrome, unspecified: K58.9

## 2015-12-04 NOTE — ED Notes (Signed)
Notified by registration that patient left.  

## 2015-12-04 NOTE — ED Notes (Signed)
Patient complaining of swelling to lip and generalized itching x 2 days. States she was recently started on amitiza and cipro for IBS and UTI last week. States amitiza dose was increased on Tuesday when symptoms started. Denies difficulty breathing or swallowing.

## 2015-12-12 ENCOUNTER — Encounter (HOSPITAL_COMMUNITY): Payer: Self-pay | Admitting: *Deleted

## 2015-12-12 ENCOUNTER — Emergency Department (HOSPITAL_COMMUNITY)
Admission: EM | Admit: 2015-12-12 | Discharge: 2015-12-13 | Disposition: A | Discharge status: Home / Self Care | Payer: BLUE CROSS/BLUE SHIELD | Attending: Emergency Medicine | Admitting: Emergency Medicine

## 2015-12-12 DIAGNOSIS — N182 Chronic kidney disease, stage 2 (mild): Secondary | ICD-10-CM | POA: Insufficient documentation

## 2015-12-12 DIAGNOSIS — E1010 Type 1 diabetes mellitus with ketoacidosis without coma: Secondary | ICD-10-CM | POA: Diagnosis not present

## 2015-12-12 DIAGNOSIS — F319 Bipolar disorder, unspecified: Secondary | ICD-10-CM | POA: Diagnosis not present

## 2015-12-12 DIAGNOSIS — Z794 Long term (current) use of insulin: Secondary | ICD-10-CM | POA: Insufficient documentation

## 2015-12-12 DIAGNOSIS — Z87891 Personal history of nicotine dependence: Secondary | ICD-10-CM | POA: Diagnosis not present

## 2015-12-12 DIAGNOSIS — R1012 Left upper quadrant pain: Secondary | ICD-10-CM | POA: Insufficient documentation

## 2015-12-12 DIAGNOSIS — E1022 Type 1 diabetes mellitus with diabetic chronic kidney disease: Secondary | ICD-10-CM | POA: Diagnosis not present

## 2015-12-12 DIAGNOSIS — E039 Hypothyroidism, unspecified: Secondary | ICD-10-CM | POA: Diagnosis not present

## 2015-12-12 DIAGNOSIS — R1011 Right upper quadrant pain: Secondary | ICD-10-CM | POA: Diagnosis present

## 2015-12-12 DIAGNOSIS — R1010 Upper abdominal pain, unspecified: Secondary | ICD-10-CM

## 2015-12-12 DIAGNOSIS — I129 Hypertensive chronic kidney disease with stage 1 through stage 4 chronic kidney disease, or unspecified chronic kidney disease: Secondary | ICD-10-CM | POA: Diagnosis not present

## 2015-12-12 DIAGNOSIS — Z7982 Long term (current) use of aspirin: Secondary | ICD-10-CM | POA: Insufficient documentation

## 2015-12-12 LAB — COMPREHENSIVE METABOLIC PANEL
ALK PHOS: 93 U/L (ref 38–126)
ALT: 18 U/L (ref 14–54)
AST: 17 U/L (ref 15–41)
Albumin: 3.9 g/dL (ref 3.5–5.0)
Anion gap: 10 (ref 5–15)
BILIRUBIN TOTAL: 0.7 mg/dL (ref 0.3–1.2)
BUN: 18 mg/dL (ref 6–20)
CALCIUM: 9.1 mg/dL (ref 8.9–10.3)
CO2: 25 mmol/L (ref 22–32)
CREATININE: 1.86 mg/dL — AB (ref 0.44–1.00)
Chloride: 101 mmol/L (ref 101–111)
GFR calc non Af Amer: 32 mL/min — ABNORMAL LOW (ref >60)
GFR, EST AFRICAN AMERICAN: 37 mL/min — AB (ref >60)
GLUCOSE: 105 mg/dL — AB (ref 65–99)
Potassium: 3.4 mmol/L — ABNORMAL LOW (ref 3.5–5.1)
SODIUM: 136 mmol/L (ref 135–145)
TOTAL PROTEIN: 6.8 g/dL (ref 6.5–8.1)

## 2015-12-12 LAB — CBC
HCT: 39.6 % (ref 36.0–46.0)
Hemoglobin: 13.6 g/dL (ref 12.0–15.0)
MCH: 29.8 pg (ref 26.0–34.0)
MCHC: 34.3 g/dL (ref 30.0–36.0)
MCV: 86.8 fL (ref 78.0–100.0)
PLATELETS: 255 10*3/uL (ref 150–400)
RBC: 4.56 MIL/uL (ref 3.87–5.11)
RDW: 13.6 % (ref 11.5–15.5)
WBC: 8.7 10*3/uL (ref 4.0–10.5)

## 2015-12-12 LAB — URINE MICROSCOPIC-ADD ON: RBC / HPF: NONE SEEN RBC/hpf (ref 0–5)

## 2015-12-12 LAB — URINALYSIS, ROUTINE W REFLEX MICROSCOPIC
BILIRUBIN URINE: NEGATIVE
Glucose, UA: NEGATIVE mg/dL
HGB URINE DIPSTICK: NEGATIVE
KETONES UR: NEGATIVE mg/dL
Nitrite: NEGATIVE
PROTEIN: NEGATIVE mg/dL
Specific Gravity, Urine: 1.005 (ref 1.005–1.030)
pH: 6.5 (ref 5.0–8.0)

## 2015-12-12 LAB — I-STAT BETA HCG BLOOD, ED (MC, WL, AP ONLY)

## 2015-12-12 LAB — LIPASE, BLOOD: Lipase: 24 U/L (ref 11–51)

## 2015-12-12 MED ORDER — DICYCLOMINE HCL 10 MG PO CAPS
20.0000 mg | ORAL_CAPSULE | Freq: Once | ORAL | Status: AC
  Administered 2015-12-12: 20 mg via ORAL
  Filled 2015-12-12: qty 2

## 2015-12-12 NOTE — ED Provider Notes (Signed)
CSN: [id number]     Arrival date & time 12/12/15  2141 History  By signing my name below, I, Georgette Shell, attest that this documentation has been prepared under the direction and in the presence of Charlann Lange, PA-C. Electronically Signed: Georgette Shell, ED Scribe. 12/12/2015. 11:34 PM.   Chief Complaint  Patient presents with  . Abdominal Cramping    bilateral abd cramping, hx of IBS,  nausea but no vomiting, has eating today   The history is provided by the patient. No language interpreter was used.   HPI Comments: Jasmine [name] is a 45 y.o. female with h/o IBS and GERD who presents to the Emergency Department complaining of intermittent bilateral abdominal cramping that radiates to her back onset 6 hours ago. Pt states she had it this morning and was resolved until 4 pm when it came back. Pt also has associated nausea, shortness of breath, and chest tightness. Pt states she has IBS and is prescribed Bentyl but is not normally compliant unless she "needs it" because she's "scared". Pt notes that this pain is reoccuring and the Bentyl works when she takes it but did not take it today because she was anxious and didn't like being by herself. Pt has a GI she regularly follows up with. Pt denies vomiting, fever, diarrhea.   Past Medical History  Diagnosis Date  . Hypertension   . Diabetes mellitus   . Bipolar disorder ([place])   . CKD (chronic kidney disease), stage II   . Elevated cholesterol   . GERD (gastroesophageal reflux disease)   . Stress incontinence   . Hypothyroidism   . Sleep apnea     HAS C -PAP / DOES NOT USE  . Anxiety   . Depression   . UTI (lower urinary tract infection)   . Tobacco abuse   . Obesity   . DKA, type 1 ([place]) 11/04/2011  . IBS (irritable bowel syndrome)    Past Surgical History  Procedure Laterality Date  . Ovary removed    . Nasal fracture surgery    . Uterine fibroid surgery  2001  . Pubovaginal sling  08/16/2011    Procedure: [name];   Surgeon: Bernestine Amass, MD;  Location: [facility];  Service: Urology;  Laterality: N/A;         . Incontinence surgery     Family History  Problem Relation Age of Onset  . Asthma Mother   . Heart disease Father   . Cancer Paternal Grandmother     lung and breast  . Bladder Cancer Paternal Grandfather    Social History  Substance Use Topics  . Smoking status: Former Smoker -- 0.50 packs/day for 20 years    Types: Cigarettes  . Smokeless tobacco: Former Systems developer    Quit date: 09/21/2012  . Alcohol Use: No   OB History    Gravida Para Term Preterm AB TAB SAB Ectopic Multiple Living   1              Review of Systems  Constitutional: Negative for fever.  Respiratory: Positive for chest tightness and shortness of breath.   Gastrointestinal: Positive for nausea and abdominal pain. Negative for vomiting and diarrhea.      Allergies  Buspar; Advair diskus; Ondansetron; Biaxin; and Hydroxyzine  Home Medications   Prior to Admission medications   Medication Sig Start Date End Date Taking? Authorizing Provider  acetaminophen (TYLENOL) 500 MG tablet Take 1,000 mg by mouth every 6 (six) hours as  needed. For pain    Historical Provider, MD  aspirin EC 81 MG tablet Take 81 mg by mouth daily.    Historical Provider, MD  busPIRone (BUSPAR) 10 MG tablet Take 10 mg by mouth 2 (two) times daily.     Historical Provider, MD  carvedilol (COREG) 25 MG tablet Take 37.5 mg by mouth 2 (two) times daily with a meal.     Historical Provider, MD  cholecalciferol (VITAMIN D) 1000 UNITS tablet Take 1,000 Units by mouth daily.    Historical Provider, MD  ciprofloxacin (CIPRO) 500 MG tablet Take 1 tablet (500 mg total) by mouth 2 (two) times daily. 10/29/15   Merrily Pew, MD  dicyclomine (BENTYL) 10 MG capsule Take 1 capsule (10 mg total) by mouth 3 (three) times daily as needed (abdominal pain). 10/29/15   Merrily Pew, MD  fish oil-omega-3 fatty acids 1000 MG capsule Take 1 g by mouth daily.     Historical Provider, MD  fluticasone (FLONASE) 50 MCG/ACT nasal spray Place 2 sprays into the nose daily as needed for allergies.    Historical Provider, MD  folic acid (FOLVITE) [id number] MCG tablet Take 800 mcg by mouth daily.    Historical Provider, MD  furosemide (LASIX) 40 MG tablet Take 40 mg by mouth 2 (two) times daily.     Historical Provider, MD  insulin aspart (NOVOLOG) 100 UNIT/ML injection Inject into the skin continuous. Daily Bolus of 2.9 units every hour    Historical Provider, MD  lamoTRIgine (LAMICTAL) 100 MG tablet Take 50 mg by mouth every morning.     Historical Provider, MD  levothyroxine (SYNTHROID, LEVOTHROID) 150 MCG tablet Take 150 mcg by mouth daily before breakfast.    Historical Provider, MD  LORazepam (ATIVAN) 1 MG tablet Take 1 tablet (1 mg total) by mouth every 8 (eight) hours as needed for anxiety. 10/01/15   Merryl Hacker, MD  medroxyPROGESTERone (PROVERA) 10 MG tablet Take 20 mg by mouth as directed. Only takes the first 5 days of the month    Historical Provider, MD  ondansetron (ZOFRAN ODT) 4 MG disintegrating tablet Take 1 tablet (4 mg total) by mouth every 8 (eight) hours as needed for nausea or vomiting. 10/16/15   Delice Bison Ward, DO  oxyCODONE-acetaminophen (PERCOCET/ROXICET) 5-325 MG tablet Take 1-2 tablets by mouth every 6 (six) hours as needed. 10/16/15   Kristen N Ward, DO  polyethylene glycol (MIRALAX / GLYCOLAX) packet Take 17 g by mouth daily.    Historical Provider, MD  pravastatin (PRAVACHOL) 80 MG tablet Take 80 mg by mouth at bedtime.    Historical Provider, MD  promethazine (PHENERGAN) 12.5 MG tablet Take 12.5 mg by mouth every 6 (six) hours as needed for nausea or vomiting.    Historical Provider, MD  ranitidine (ZANTAC) 150 MG tablet Take 150 mg by mouth at bedtime.    Historical Provider, MD  tiZANidine (ZANAFLEX) 4 MG capsule Take 4 mg by mouth 3 (three) times daily.    Historical Provider, MD   BP 138/106 mmHg  Pulse 76  Temp(Src) 97.5 F (36.4  C) (Oral)  Resp 22  Ht 5\' 9"  (1.753 m)  Wt 286 lb (129.729 kg)  BMI 42.22 kg/m2  SpO2 96%  LMP 12/03/2015 Physical Exam  Constitutional: She appears well-developed and well-nourished.  Tearful, anxious.  HENT:  Head: Normocephalic and atraumatic.  Eyes: Conjunctivae are normal.  Cardiovascular: Normal rate and regular rhythm.   No murmur heard. Pulmonary/Chest: Effort normal and breath sounds normal.  No respiratory distress.  Lungs are clear to auscultation.  Abdominal: Soft. She exhibits no distension. There is no tenderness.  Abd is essentially non-tender, soft, obese.   Musculoskeletal: Normal range of motion.  Neurological: She is alert.  Skin: Skin is warm and dry.  Psychiatric: She has a normal mood and affect. Her behavior is normal.  Nursing note and vitals reviewed.   ED Course  Procedures  DIAGNOSTIC STUDIES: Oxygen Saturation is 100% on RA, normal by my interpretation.    COORDINATION OF CARE: 11:28 PM Discussed treatment plan with pt at bedside and pt agreed to plan.  Labs Review Labs Reviewed  COMPREHENSIVE METABOLIC PANEL - Abnormal; Notable for the following:    Potassium 3.4 (*)    Glucose, Bld 105 (*)    Creatinine, Ser 1.86 (*)    GFR calc non Af Amer 32 (*)    GFR calc Af Amer 37 (*)    All other components within normal limits  URINALYSIS, ROUTINE W REFLEX MICROSCOPIC (NOT AT [facility]) - Abnormal; Notable for the following:    Leukocytes, UA SMALL (*)    All other components within normal limits  URINE MICROSCOPIC-ADD ON - Abnormal; Notable for the following:    Squamous Epithelial / LPF 6-30 (*)    Bacteria, UA RARE (*)    All other components within normal limits  LIPASE, BLOOD  CBC  I-STAT BETA HCG BLOOD, ED ([facility], [facility], [facility] ONLY)   Results for orders placed or performed during the hospital encounter of 12/12/15  Lipase, blood  Result Value Ref Range   Lipase 24 11 - 51 U/L  Comprehensive metabolic panel  Result Value Ref Range   Sodium 136  135 - 145 mmol/L   Potassium 3.4 (L) 3.5 - 5.1 mmol/L   Chloride 101 101 - 111 mmol/L   CO2 25 22 - 32 mmol/L   Glucose, Bld 105 (H) 65 - 99 mg/dL   BUN 18 6 - 20 mg/dL   Creatinine, Ser 1.86 (H) 0.44 - 1.00 mg/dL   Calcium 9.1 8.9 - 10.3 mg/dL   Total Protein 6.8 6.5 - 8.1 g/dL   Albumin 3.9 3.5 - 5.0 g/dL   AST 17 15 - 41 U/L   ALT 18 14 - 54 U/L   Alkaline Phosphatase 93 38 - 126 U/L   Total Bilirubin 0.7 0.3 - 1.2 mg/dL   GFR calc non Af Amer 32 (L) >60 mL/min   GFR calc Af Amer 37 (L) >60 mL/min   Anion gap 10 5 - 15  CBC  Result Value Ref Range   WBC 8.7 4.0 - 10.5 K/uL   RBC 4.56 3.87 - 5.11 MIL/uL   Hemoglobin 13.6 12.0 - 15.0 g/dL   HCT 39.6 36.0 - 46.0 %   MCV 86.8 78.0 - 100.0 fL   MCH 29.8 26.0 - 34.0 pg   MCHC 34.3 30.0 - 36.0 g/dL   RDW 13.6 11.5 - 15.5 %   Platelets 255 150 - 400 K/uL  Urinalysis, Routine w reflex microscopic  Result Value Ref Range   Color, Urine YELLOW YELLOW   APPearance CLEAR CLEAR   Specific Gravity, Urine 1.005 1.005 - 1.030   pH 6.5 5.0 - 8.0   Glucose, UA NEGATIVE NEGATIVE mg/dL   Hgb urine dipstick NEGATIVE NEGATIVE   Bilirubin Urine NEGATIVE NEGATIVE   Ketones, ur NEGATIVE NEGATIVE mg/dL   Protein, ur NEGATIVE NEGATIVE mg/dL   Nitrite NEGATIVE NEGATIVE   Leukocytes, UA SMALL (A) NEGATIVE  Urine microscopic-add on  Result Value Ref Range   Squamous Epithelial / LPF 6-30 (A) NONE SEEN   WBC, UA 0-5 0 - 5 WBC/hpf   RBC / HPF NONE SEEN 0 - 5 RBC/hpf   Bacteria, UA RARE (A) NONE SEEN  I-Stat beta hCG blood, ED  Result Value Ref Range   I-stat hCG, quantitative <5.0 <5 mIU/mL   Comment 3            I have personally reviewed and evaluated these lab results as part of my medical decision-making.   MDM   Final diagnoses:  None    1. Abdominal pain 2. H/o IBS  Patient given Bentyl in the ED with resolution of symptoms. She is resting comfortably. VSS. She can be discharged home and is encouraged to take her medications  as prescribed.   I personally performed the services described in this documentation, which was scribed in my presence. The recorded information has been reviewed and is accurate.    Charlann Lange, PA-C 12/17/15 [id number]  Charlesetta Shanks, MD 12/22/15 [phone]

## 2015-12-12 NOTE — ED Notes (Signed)
Bed: [id number] Expected date:  Expected time:  Means of arrival:  Comments: EMS 45yo F Abd pain

## 2015-12-12 NOTE — ED Notes (Signed)
Pt comes from home where she has been having abd cramping x 6 hours.  Pt is diabetic. Blood glucose: 97 per EMS.

## 2015-12-13 NOTE — ED Notes (Signed)
Pt ambulated to bathroom without difficulty or abnormal gait

## 2015-12-13 NOTE — Discharge Instructions (Signed)
CONTINUE TO USE YOUR BENTYL AS DIRECTED FOR RECURRENT PAIN IN THE ABDOMEN. RETURN TO THE EMERGENCY DEPARTMENT AS NEEDED FOR WORSENING SYMPTOMS OR NEW CONCERN.
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
## 2016-05-20 ENCOUNTER — Encounter (HOSPITAL_COMMUNITY): Payer: Self-pay | Admitting: Psychiatry

## 2016-05-20 ENCOUNTER — Ambulatory Visit (INDEPENDENT_AMBULATORY_CARE_PROVIDER_SITE_OTHER): Payer: BLUE CROSS/BLUE SHIELD | Admitting: Psychiatry

## 2016-05-20 VITALS — BP 114/80 | HR 76 | Ht 69.0 in | Wt 291.4 lb

## 2016-05-20 DIAGNOSIS — Z888 Allergy status to other drugs, medicaments and biological substances status: Secondary | ICD-10-CM

## 2016-05-20 DIAGNOSIS — Z79899 Other long term (current) drug therapy: Secondary | ICD-10-CM

## 2016-05-20 DIAGNOSIS — F3181 Bipolar II disorder: Secondary | ICD-10-CM | POA: Diagnosis not present

## 2016-05-20 DIAGNOSIS — Z7982 Long term (current) use of aspirin: Secondary | ICD-10-CM

## 2016-05-20 DIAGNOSIS — Z8249 Family history of ischemic heart disease and other diseases of the circulatory system: Secondary | ICD-10-CM

## 2016-05-20 DIAGNOSIS — Z794 Long term (current) use of insulin: Secondary | ICD-10-CM

## 2016-05-20 DIAGNOSIS — Z801 Family history of malignant neoplasm of trachea, bronchus and lung: Secondary | ICD-10-CM

## 2016-05-20 DIAGNOSIS — Z803 Family history of malignant neoplasm of breast: Secondary | ICD-10-CM

## 2016-05-20 DIAGNOSIS — Z825 Family history of asthma and other chronic lower respiratory diseases: Secondary | ICD-10-CM

## 2016-05-20 DIAGNOSIS — Z87891 Personal history of nicotine dependence: Secondary | ICD-10-CM

## 2016-05-20 DIAGNOSIS — R45851 Suicidal ideations: Secondary | ICD-10-CM

## 2016-05-20 MED ORDER — DULOXETINE HCL 60 MG PO CPEP: 60.0000 mg | ORAL_CAPSULE | Freq: Every day | ORAL | 1 refills | Status: DC

## 2016-05-20 NOTE — Patient Instructions (Signed)
1. Continue lamotrigine 100 mg daily 2. Increase duloxetine 60 mg daily 3. Return to clinic in January

## 2016-05-21 ENCOUNTER — Other Ambulatory Visit (HOSPITAL_COMMUNITY): Payer: Self-pay | Admitting: Psychiatry

## 2016-05-21 ENCOUNTER — Telehealth (HOSPITAL_COMMUNITY): Payer: Self-pay | Admitting: *Deleted

## 2016-05-21 MED ORDER — LAMOTRIGINE 100 MG PO TABS: 100.0000 mg | ORAL_TABLET | Freq: Every day | ORAL | 1 refills | Status: DC

## 2016-05-21 NOTE — Telephone Encounter (Signed)
I believe medication she is talking about is lamotrigine and duloxetine (not Abilify). They should have duloxetine order  and I ordered lamotrigine today (she declined the refill at the last visit).

## 2016-05-21 NOTE — Telephone Encounter (Signed)
voice message from patient, she said pharmacy did not receive her Abilify and Cymbalta.

## 2016-05-24 NOTE — Telephone Encounter (Signed)
noted 

## 2016-05-24 NOTE — Telephone Encounter (Signed)
Called pt and informed her and she verbalized understanding.

## 2016-05-25 ENCOUNTER — Ambulatory Visit: Payer: BLUE CROSS/BLUE SHIELD | Attending: Physician Assistant | Admitting: Physical Therapy

## 2016-05-25 DIAGNOSIS — R293 Abnormal posture: Secondary | ICD-10-CM | POA: Insufficient documentation

## 2016-05-25 DIAGNOSIS — M6281 Muscle weakness (generalized): Secondary | ICD-10-CM | POA: Diagnosis present

## 2016-05-25 DIAGNOSIS — M5442 Lumbago with sciatica, left side: Secondary | ICD-10-CM | POA: Diagnosis present

## 2016-05-25 DIAGNOSIS — M5416 Radiculopathy, lumbar region: Secondary | ICD-10-CM | POA: Insufficient documentation

## 2016-05-25 NOTE — Therapy (Signed)
[facility] [address]  [place], [place], [address] Phone: [phone]   Fax:  [phone]  Physical Therapy Evaluation  Patient Details  Name: Jasmine [name] MRN: [id number] Date of Birth: August 12, 1970 Referring Provider: Marella Chimes, PA  Encounter Date: 05/25/2016      PT End of Session - 05/25/16 1406    Visit Number 1   Number of Visits 12   Date for PT Re-Evaluation 07/13/16   PT Start Time 4132   PT Stop Time 1346   PT Time Calculation (min) 33 min   Activity Tolerance Patient tolerated treatment well   Behavior During Therapy [facility] for tasks assessed/performed      Past Medical History:  Diagnosis Date  . Anxiety   . Bipolar disorder ([place])   . CKD (chronic kidney disease), stage II   . Depression   . Diabetes mellitus   . DKA, type 1 ([place]) 11/04/2011  . Elevated cholesterol   . GERD (gastroesophageal reflux disease)   . Hypertension   . Hypothyroidism   . IBS (irritable bowel syndrome)   . Obesity   . Sleep apnea    HAS C -PAP / DOES NOT USE  . Stress incontinence   . Tobacco abuse   . UTI (lower urinary tract infection)     Past Surgical History:  Procedure Laterality Date  . INCONTINENCE SURGERY    . NASAL FRACTURE SURGERY    . ovary removed    . PUBOVAGINAL SLING  08/16/2011   Procedure: [name];  Surgeon: Bernestine Amass, MD;  Location: [facility];  Service: Urology;  Laterality: N/A;         . UTERINE FIBROID SURGERY  2001    There were no vitals filed for this visit.       Subjective Assessment - 05/25/16 1315    Subjective Patient reporting pain in mid to low back with intermittent pain in L leg and foot. Back pain seems to be exacerbated during housework and prolonged standing. Rest makes pain better. Some days pain is greater in the morning upon first rising. Patient reporting intermittent numbness and tingling of L leg. Denies bowel and bladder involvement. Issue  has persisted for a few months with no known mechanism of injury.    Pertinent History DDD of cervical and lumbar region, CKD stage III, type I DM, bipolar disorder, IBS   Limitations Sitting;Standing;Walking   How long can you sit comfortably? 5 minutes   How long can you stand comfortably? 20 minutes   How long can you walk comfortably? 20 minutes   Diagnostic tests none   Patient Stated Goals less pain   Currently in Pain? Yes   Pain Score 1    Pain Location Back   Pain Orientation Left;Lower   Pain Descriptors / Indicators Aching   Pain Type Chronic pain   Pain Onset More than a month ago   Pain Frequency Intermittent   Aggravating Factors  housework, prolonged sitting or standing   Pain Relieving Factors rest, pain meds            [facility] PT Assessment - 05/25/16 1305      Assessment   Medical Diagnosis L sided lumbar radiculopathy   Referring Provider Marella Chimes, PA   Onset Date/Surgical Date --  "a few months ago"   Next MD Visit prn   Prior Therapy no     Balance Screen   Has the patient fallen  in the past 6 months No   Has the patient had a decrease in activity level because of a fear of falling?  No   Is the patient reluctant to leave their home because of a fear of falling?  No     Home Environment   Living Environment Private residence   Living Arrangements Spouse/significant other   Type of [place] Access Level entry   [place] None     Prior Function   Level of [place] Requirements disability since 2012   Leisure walk dogs, [employer], church     Cognition   Overall Cognitive Status Within Functional Limits for tasks assessed     Observation/Other Assessments   Focus on Therapeutic Outcomes (FOTO)  Lumbar Spine: 50 (50% limited, predicted 42% limited)     Posture/Postural Control   Posture/Postural Control Postural limitations   Postural Limitations  Rounded Shoulders;Forward head     ROM / Strength   AROM / PROM / Strength AROM;Strength     AROM   AROM Assessment Site Lumbar   Lumbar Flexion fingertips to anterior ankle joint line - "tightness"   Lumbar Extension 50% limited - "painful"   Lumbar - Right Side Bend fingertip to joint line   Lumbar - Left Side Bend fingertip to joint line - "painful"   Lumbar - Right Rotation WFL - "painful"   Lumbar - Left Rotation [facility]     Strength   Strength Assessment Site Hip;Knee   Right/Left Hip Right;Left   Right Hip Flexion 5/5   Right Hip Extension 4/5   Right Hip ABduction 4/5   Left Hip Flexion 4/5   Left Hip Extension 3+/5   Left Hip ABduction 3+/5   Right/Left Knee Right;Left   Right Knee Flexion 5/5   Right Knee Extension 5/5   Left Knee Flexion 4+/5   Left Knee Extension 4+/5     Flexibility   Soft Tissue Assessment /Muscle Length yes   Hamstrings B tightness (L>R)   Quadriceps L tightness   ITB B tightness     Palpation   Palpation comment some tenderness to L lumbar paraspinals     Special Tests    Special Tests Lumbar   Lumbar Tests Straight Leg Raise     Straight Leg Raise   Findings Negative   Side  Left                           PT Education - 05/25/16 1406    Education provided Yes   Education Details exam findings, POC, HEP   Person(s) Educated Patient   Methods Explanation;Demonstration;Handout   Comprehension Verbalized understanding;Returned demonstration;Need further instruction             PT Long Term Goals - 05/25/16 1408      PT LONG TERM GOAL #1   Title patient to be independent with HEP (07/13/16)   Status New     PT LONG TERM GOAL #2   Title Patient to improve L hip strength to >/= 4+/5 (07/13/16)   Status New     PT LONG TERM GOAL #3   Title Patient to improve L lumbar lateral flexion and R rotation ROM with no increase in pain (07/13/16)   Status New     PT LONG TERM GOAL #4   Title Patient to report  ability  to stand/walk for > 30 minutes with no increase in pain (07/13/16)   Status New     PT LONG TERM GOAL #5   Title Patient to demonstrate proper postural alignment with good core engagement (07/13/16)   Status New               Plan - 05/25/16 1407    Clinical Impression Statement Patient is a 48 4/o female presenting to [facility] today for moderate complexity evaluation regarding L sided low back pain with intermittent radiation into L leg and foot. Patient with complex medical history including type I DM, DDD of lumbar and cervical region, CKD stage III as well as other comorbidities. Patient today with very minimal pain during session, however requiring frequent positional changes to prevent onset of pain. Patient with B HS tightness (L>R), tight IT band, reduced strength of L hip musculature, decreased lumbar ROM with L lateral flexion, R rotation and extension ebing most provocative positions. Patient to benefit from skilled PT intervention to address the above listed deficits to allow for overall improved function and mobility.    Rehab Potential Good   Clinical Impairments Affecting Rehab Potential DDD of cervical and lumbar region, CKD stage III, type I DM, bipolar disorder, IBS   PT Frequency 2x / week   PT Duration 6 weeks   PT Treatment/Interventions ADLs/Self Care Home Management;Cryotherapy;Electrical Stimulation;Moist Heat;Ultrasound;Neuromuscular re-education;Balance training;Therapeutic exercise;Therapeutic activities;Functional mobility training;Stair training;Gait training;Patient/family education;Manual techniques;Passive range of motion;Vasopneumatic Device;Taping;Dry needling   Consulted and Agree with Plan of Care Patient      Patient will benefit from skilled therapeutic intervention in order to improve the following deficits and impairments:  Pain, Decreased mobility, Decreased endurance, Decreased strength, Decreased activity tolerance  Visit  Diagnosis: Radiculopathy, lumbar region - Plan: PT plan of care cert/re-cert  Left-sided low back pain with left-sided sciatica, unspecified chronicity - Plan: PT plan of care cert/re-cert  Abnormal posture - Plan: PT plan of care cert/re-cert  Muscle weakness (generalized) - Plan: PT plan of care cert/re-cert     Problem List Patient Active Problem List   Diagnosis Date Noted  . Hyperglycemia 12/25/2012  . Acute on chronic renal failure ([place]) 12/25/2012  . Pulmonary infiltrates 10/02/2012  . Postnasal drip 10/02/2012  . Hyperkalemia 09/25/2012  . Chronic kidney disease, stage III (moderate) 09/25/2012  . Morbid obesity ([place]) 09/24/2012  . HTN (hypertension) 09/24/2012  . Bipolar II disorder ([place]) 09/24/2012  . URI (upper respiratory infection) 09/24/2012  . DKA, type 1 ([place]) 11/04/2011  . Gastroenteritis 11/03/2011  . DM (diabetes mellitus), type 1, uncontrolled ([place]) 11/03/2011  . Hyponatremia 11/03/2011  . CKD (chronic kidney disease), stage II 11/03/2011  . Elevated lipase 11/03/2011  . Hypothyroidism 11/03/2011  . Tobacco abuse 11/03/2011  . SUI (stress urinary incontinence, female) 08/16/2011     Lanney Gins, PT, DPT 05/25/16 2:24 PM   [facility] [address]  [place], [place], [address] Phone: [phone]   Fax:  [phone]  Name: Jasmine [name] MRN: [id number] Date of Birth: [birth year]

## 2016-05-25 NOTE — Patient Instructions (Signed)
Pelvic Tilt: Posterior - Legs Bent (Supine)    Tighten stomach and flatten back by rolling pelvis down. Hold __5-10__ seconds. Relax. Repeat __15__ times per set.      Hamstring Step 2    Left foot relaxed, knee straight, other leg bent, foot flat. Raise straight leg further upward to maximal range. Hold _30__ seconds. Relax leg completely down. Repeat _3__ times.     Outer Hip Stretch: Reclined IT Band Stretch (Strap)    Strap around opposite foot, pull across only as far as possible with shoulders on mat. Hold for __30__ breaths. Repeat __3__ times each leg.      Single knee to chest with squeeze -  30 seconds x 3 times

## 2016-06-01 ENCOUNTER — Ambulatory Visit: Payer: BLUE CROSS/BLUE SHIELD

## 2016-06-01 DIAGNOSIS — M5416 Radiculopathy, lumbar region: Secondary | ICD-10-CM | POA: Diagnosis not present

## 2016-06-01 DIAGNOSIS — M5442 Lumbago with sciatica, left side: Secondary | ICD-10-CM

## 2016-06-01 DIAGNOSIS — M6281 Muscle weakness (generalized): Secondary | ICD-10-CM

## 2016-06-01 DIAGNOSIS — R293 Abnormal posture: Secondary | ICD-10-CM

## 2016-06-01 NOTE — Therapy (Signed)
[facility] [address]  [place], [place], [address] Phone: [phone]   Fax:  [phone]  Physical Therapy Treatment  Patient Details  Name: MCKINLEE DUNK MRN: [id number] Date of Birth: November 25, 1970 Referring Provider: Marella Chimes, PA  Encounter Date: 06/01/2016      PT End of Session - 06/01/16 1452    Visit Number 2   Number of Visits 12   Date for PT Re-Evaluation 07/13/16   PT Start Time 0300   PT Stop Time 1527   PT Time Calculation (min) 40 min   Activity Tolerance Patient tolerated treatment well   Behavior During Therapy [facility] for tasks assessed/performed      Past Medical History:  Diagnosis Date  . Anxiety   . Bipolar disorder ([place])   . CKD (chronic kidney disease), stage II   . Depression   . Diabetes mellitus   . DKA, type 1 ([place]) 11/04/2011  . Elevated cholesterol   . GERD (gastroesophageal reflux disease)   . Hypertension   . Hypothyroidism   . IBS (irritable bowel syndrome)   . Obesity   . Sleep apnea    HAS C -PAP / DOES NOT USE  . Stress incontinence   . Tobacco abuse   . UTI (lower urinary tract infection)     Past Surgical History:  Procedure Laterality Date  . INCONTINENCE SURGERY    . NASAL FRACTURE SURGERY    . ovary removed    . PUBOVAGINAL SLING  08/16/2011   Procedure: [name];  Surgeon: Bernestine Amass, MD;  Location: [facility];  Service: Urology;  Laterality: N/A;         . UTERINE FIBROID SURGERY  2001    There were no vitals filed for this visit.      Subjective Assessment - 06/01/16 1450    Subjective Pt. reporting LBP was bad yesterday however is better today.  Pt. admitting today to being inconsistent with HEP performance.     Currently in Pain? No/denies   Pain Score 2    Pain Location Back   Pain Orientation Left;Lower   Pain Descriptors / Indicators Aching   Pain Type Chronic pain   Pain Onset More than a month ago   Pain Frequency  Intermittent   Multiple Pain Sites No            [facility] PT Assessment - 06/01/16 1456      Assessment   Medical Diagnosis L sided lumbar radiculopathy   Referring Provider Marella Chimes, PA   Next MD Visit ~ 06/13/16             [facility] Adult PT Treatment/Exercise - 06/01/16 1510      Lumbar Exercises: Supine   Other Supine Lumbar Exercises LTR x 5 reps each way 5"    Other Supine Lumbar Exercises adduction ball squeeze 5" x 10 reps      Knee/Hip Exercises: Stretches   Passive Hamstring Stretch 2 reps;30 seconds  L sided   Passive Hamstring Stretch Limitations with therapist    ITB Stretch 2 reps;30 seconds  L-sided   ITB Stretch Limitations with therapist      Knee/Hip Exercises: Aerobic   Nustep NuStep: lvl 5, 6 min      Knee/Hip Exercises: Supine   Bridges 20 reps  2"    Bridges with [employer] AROM;5 reps  terminated due to back pain    Other Supine Knee/Hip Exercises Pelvic tilt  5" 2 x 15 reps   Other Supine Knee/Hip Exercises Hooklying alteranting LE march with green TB x 15 reps     Knee/Hip Exercises: Sidelying   Clams B sidelying clam shell with green TB x 20 reps   mild L hip pain on L sidelying near end of set    Other Sidelying Knee/Hip Exercises Supine lumbar stretch with heels on peanut p-ball and therapist pushing DKTC 5 x 5 sec   some increased pain with this    Other Sidelying Knee/Hip Exercises Supine LTR with LE on oranged (55cm) p-ball 5 x 5"             PT Long Term Goals - 06/01/16 1453      PT LONG TERM GOAL #1   Title patient to be independent with HEP (07/13/16)   Status On-going     PT LONG TERM GOAL #2   Title Patient to improve L hip strength to >/= 4+/5 (07/13/16)   Status On-going     PT LONG TERM GOAL #3   Title Patient to improve L lumbar lateral flexion and R rotation ROM with no increase in pain (07/13/16)   Status On-going     PT LONG TERM GOAL #4   Title Patient to report ability to stand/walk for > 30 minutes with no  increase in pain (07/13/16)   Status On-going     PT LONG TERM GOAL #5   Title Patient to demonstrate proper postural alignment with good core engagement (07/13/16)   Status On-going               Plan - 06/01/16 1458    Clinical Impression Statement Pt. with good response to lumbopelvic stretching and strengthening therex today with LBP falling from 2/10 initially to pain free to end treatment.  Pt. admitting to poor adherence to HEP activities over weekend however instructed by therapist today to perform HEP daily.  Will plan to progress L hip strengthening and lumbopelvic stability activities per tolerance in coming visits.   PT Treatment/Interventions ADLs/Self Care Home Management;Cryotherapy;Electrical Stimulation;Moist Heat;Ultrasound;Neuromuscular re-education;Balance training;Therapeutic exercise;Therapeutic activities;Functional mobility training;Stair training;Gait training;Patient/family education;Manual techniques;Passive range of motion;Vasopneumatic Device;Taping;Dry needling   PT Next Visit Plan L-sided hip strengthening and stretching; lumbopelvic/core stability training; HEP review prn      Patient will benefit from skilled therapeutic intervention in order to improve the following deficits and impairments:  Pain, Decreased mobility, Decreased endurance, Decreased strength, Decreased activity tolerance  Visit Diagnosis: Radiculopathy, lumbar region  Left-sided low back pain with left-sided sciatica, unspecified chronicity  Abnormal posture  Muscle weakness (generalized)     Problem List Patient Active Problem List   Diagnosis Date Noted  . Hyperglycemia 12/25/2012  . Acute on chronic renal failure ([place]) 12/25/2012  . Pulmonary infiltrates 10/02/2012  . Postnasal drip 10/02/2012  . Hyperkalemia 09/25/2012  . Chronic kidney disease, stage III (moderate) 09/25/2012  . Morbid obesity ([place]) 09/24/2012  . HTN (hypertension) 09/24/2012  . Bipolar II disorder  ([place]) 09/24/2012  . URI (upper respiratory infection) 09/24/2012  . DKA, type 1 ([place]) 11/04/2011  . Gastroenteritis 11/03/2011  . DM (diabetes mellitus), type 1, uncontrolled ([place]) 11/03/2011  . Hyponatremia 11/03/2011  . CKD (chronic kidney disease), stage II 11/03/2011  . Elevated lipase 11/03/2011  . Hypothyroidism 11/03/2011  . Tobacco abuse 11/03/2011  . SUI (stress urinary incontinence, female) 08/16/2011    Bess Harvest, PTA 06/01/16 3:45 PM   [facility] [address]  [place], [place], [address] Phone: [phone]   Fax:  [phone]  Name: JEN BENEDICT MRN: [id number] Date of Birth: 08-31-1970

## 2016-06-03 ENCOUNTER — Ambulatory Visit: Payer: BLUE CROSS/BLUE SHIELD | Admitting: Physical Therapy

## 2016-06-12 ENCOUNTER — Emergency Department (HOSPITAL_COMMUNITY)
Admission: EM | Admit: 2016-06-12 | Discharge: 2016-06-12 | Disposition: A | Discharge status: Home / Self Care | Payer: BLUE CROSS/BLUE SHIELD | Attending: Emergency Medicine | Admitting: Emergency Medicine

## 2016-06-12 ENCOUNTER — Encounter (HOSPITAL_COMMUNITY): Payer: Self-pay

## 2016-06-12 DIAGNOSIS — E1065 Type 1 diabetes mellitus with hyperglycemia: Secondary | ICD-10-CM | POA: Diagnosis not present

## 2016-06-12 DIAGNOSIS — N183 Chronic kidney disease, stage 3 (moderate): Secondary | ICD-10-CM | POA: Insufficient documentation

## 2016-06-12 DIAGNOSIS — Z87891 Personal history of nicotine dependence: Secondary | ICD-10-CM | POA: Insufficient documentation

## 2016-06-12 DIAGNOSIS — Z79899 Other long term (current) drug therapy: Secondary | ICD-10-CM | POA: Insufficient documentation

## 2016-06-12 DIAGNOSIS — E039 Hypothyroidism, unspecified: Secondary | ICD-10-CM | POA: Insufficient documentation

## 2016-06-12 DIAGNOSIS — Z7982 Long term (current) use of aspirin: Secondary | ICD-10-CM | POA: Diagnosis not present

## 2016-06-12 DIAGNOSIS — Z794 Long term (current) use of insulin: Secondary | ICD-10-CM | POA: Insufficient documentation

## 2016-06-12 DIAGNOSIS — I129 Hypertensive chronic kidney disease with stage 1 through stage 4 chronic kidney disease, or unspecified chronic kidney disease: Secondary | ICD-10-CM | POA: Insufficient documentation

## 2016-06-12 DIAGNOSIS — E1022 Type 1 diabetes mellitus with diabetic chronic kidney disease: Secondary | ICD-10-CM | POA: Insufficient documentation

## 2016-06-12 LAB — BASIC METABOLIC PANEL
ANION GAP: 8 (ref 5–15)
BUN: 23 mg/dL — ABNORMAL HIGH (ref 6–20)
CALCIUM: 8.7 mg/dL — AB (ref 8.9–10.3)
CO2: 24 mmol/L (ref 22–32)
Chloride: 95 mmol/L — ABNORMAL LOW (ref 101–111)
Creatinine, Ser: 1.69 mg/dL — ABNORMAL HIGH (ref 0.44–1.00)
GFR calc Af Amer: 41 mL/min — ABNORMAL LOW (ref >60)
GFR, EST NON AFRICAN AMERICAN: 36 mL/min — AB (ref >60)
GLUCOSE: 600 mg/dL — AB (ref 65–99)
POTASSIUM: 4.6 mmol/L (ref 3.5–5.1)
SODIUM: 127 mmol/L — AB (ref 135–145)

## 2016-06-12 LAB — URINALYSIS, ROUTINE W REFLEX MICROSCOPIC
BILIRUBIN URINE: NEGATIVE
HGB URINE DIPSTICK: NEGATIVE
KETONES UR: 5 mg/dL — AB
LEUKOCYTES UA: NEGATIVE
NITRITE: NEGATIVE
PROTEIN: NEGATIVE mg/dL
Specific Gravity, Urine: 1.009 (ref 1.005–1.030)
pH: 6 (ref 5.0–8.0)

## 2016-06-12 LAB — CBC WITH DIFFERENTIAL/PLATELET
BASOS ABS: 0.1 10*3/uL (ref 0.0–0.1)
BASOS PCT: 1 %
EOS PCT: 3 %
Eosinophils Absolute: 0.3 10*3/uL (ref 0.0–0.7)
HCT: 41.8 % (ref 36.0–46.0)
Hemoglobin: 13.7 g/dL (ref 12.0–15.0)
Lymphocytes Relative: 24 %
Lymphs Abs: 1.9 10*3/uL (ref 0.7–4.0)
MCH: 30 pg (ref 26.0–34.0)
MCHC: 32.8 g/dL (ref 30.0–36.0)
MCV: 91.5 fL (ref 78.0–100.0)
MONO ABS: 0.4 10*3/uL (ref 0.1–1.0)
Monocytes Relative: 6 %
Neutro Abs: 5.1 10*3/uL (ref 1.7–7.7)
Neutrophils Relative %: 66 %
PLATELETS: 228 10*3/uL (ref 150–400)
RBC: 4.57 MIL/uL (ref 3.87–5.11)
RDW: 13.3 % (ref 11.5–15.5)
WBC: 7.7 10*3/uL (ref 4.0–10.5)

## 2016-06-12 LAB — CBG MONITORING, ED
GLUCOSE-CAPILLARY: 570 mg/dL — AB (ref 65–99)
GLUCOSE-CAPILLARY: 395 mg/dL — AB (ref 65–99)
Glucose-Capillary: 457 mg/dL — ABNORMAL HIGH (ref 65–99)
Glucose-Capillary: 288 mg/dL — ABNORMAL HIGH (ref 65–99)

## 2016-06-12 MED ORDER — PROMETHAZINE HCL 25 MG PO TABS: 25.0000 mg | ORAL_TABLET | Freq: Four times a day (QID) | ORAL | 0 refills | Status: DC | PRN

## 2016-06-12 MED ORDER — SODIUM CHLORIDE 0.9 % IV BOLUS (SEPSIS)
1000.0000 mL | Freq: Once | INTRAVENOUS | Status: AC
  Administered 2016-06-12: 1000 mL via INTRAVENOUS

## 2016-06-12 MED ORDER — PROMETHAZINE HCL 25 MG/ML IJ SOLN
12.5000 mg | Freq: Once | INTRAMUSCULAR | Status: AC
  Administered 2016-06-12: 12.5 mg via INTRAVENOUS
  Filled 2016-06-12: qty 1

## 2016-06-12 NOTE — ED Notes (Signed)
CRITICAL VALUE ALERT  Critical value received:  Glucose 600  Date of notification:  06/13/2015  Time of notification:  1000  Critical value read back:Yes.    Nurse who received alert:  [facility] RN  MD notified (1st page):  Dr. Rogene Houston  Time of first page: 1000  MD notified (2nd page):  Time of second page:  Responding MD:  Dr. Rogene Houston Time MD responded:  1000

## 2016-06-12 NOTE — ED Notes (Signed)
Pt given self bolus with 6.9 units of insulin. ED provider at bedside to give orders for this.

## 2016-06-12 NOTE — ED Provider Notes (Signed)
[facility] DEPT Provider Note   CSN: [id number] Arrival date & time: 06/12/16  0851     History   Chief Complaint Chief Complaint  Patient presents with  . Hyperglycemia    HPI Jasmine [name] is a 46 y.o. female with past medical History as outlined below, most significant for type 1 diabetes presenting with hyperglycemia.  She is on an insulin pump and had no complaints or problems going to bed last night, but woke this morning with her pump warning her that her blood glucose was elevated and that she had an occlusion along the pump line.  She has since repositioned the line and replace the tubing and the pump is working properly, but her blood glucose has remained elevated up to 570 prior to arrival.  She endorses nausea and generalized myalgias which she recognizes as symptoms associated with elevated ketones.  She denies abdominal pain, dizziness, chest pain, shortness of breath, fevers, dysuria.  She has given herself an insulin bolus prior to arrival and per her embedded CBG monitor her blood glucoses are trending downward.  The history is provided by the patient.    Past Medical History:  Diagnosis Date  . Anxiety   . Bipolar disorder ([place])   . CKD (chronic kidney disease), stage II   . Depression   . Diabetes mellitus   . DKA, type 1 ([place]) 11/04/2011  . Elevated cholesterol   . GERD (gastroesophageal reflux disease)   . Hypertension   . Hypothyroidism   . IBS (irritable bowel syndrome)   . Obesity   . Sleep apnea    HAS C -PAP / DOES NOT USE  . Stress incontinence   . Tobacco abuse   . UTI (lower urinary tract infection)     Patient Active Problem List   Diagnosis Date Noted  . Hyperglycemia 12/25/2012  . Acute on chronic renal failure ([place]) 12/25/2012  . Pulmonary infiltrates 10/02/2012  . Postnasal drip 10/02/2012  . Hyperkalemia 09/25/2012  . Chronic kidney disease, stage III (moderate) 09/25/2012  . Morbid obesity ([place]) 09/24/2012  . HTN  (hypertension) 09/24/2012  . Bipolar II disorder ([place]) 09/24/2012  . URI (upper respiratory infection) 09/24/2012  . DKA, type 1 ([place]) 11/04/2011  . Gastroenteritis 11/03/2011  . DM (diabetes mellitus), type 1, uncontrolled ([place]) 11/03/2011  . Hyponatremia 11/03/2011  . CKD (chronic kidney disease), stage II 11/03/2011  . Elevated lipase 11/03/2011  . Hypothyroidism 11/03/2011  . Tobacco abuse 11/03/2011  . SUI (stress urinary incontinence, female) 08/16/2011    Past Surgical History:  Procedure Laterality Date  . INCONTINENCE SURGERY    . NASAL FRACTURE SURGERY    . ovary removed    . PUBOVAGINAL SLING  08/16/2011   Procedure: [name];  Surgeon: Bernestine Amass, MD;  Location: [facility];  Service: Urology;  Laterality: N/A;         . UTERINE FIBROID SURGERY  2001    OB History    Gravida Para Term Preterm AB Living   1             SAB TAB Ectopic Multiple Live Births                   Home Medications    Prior to Admission medications   Medication Sig Start Date End Date Taking? Authorizing Provider  acetaminophen (TYLENOL) 500 MG tablet Take 1,000 mg by mouth every 6 (six) hours as needed. For pain   Yes Historical Provider, MD  aspirin EC 81 MG tablet Take 81 mg by mouth daily.   Yes Historical Provider, MD  carvedilol (COREG) 25 MG tablet Take 37.5 mg by mouth 2 (two) times daily with a meal.    Yes Historical Provider, MD  cholecalciferol (VITAMIN D) 1000 UNITS tablet Take 1,000 Units by mouth daily.   Yes Historical Provider, MD  Diclofenac Sodium 2 % SOLN Place onto the skin as needed.   Yes Historical Provider, MD  dicyclomine (BENTYL) 10 MG capsule Take 1 capsule (10 mg total) by mouth 3 (three) times daily as needed (abdominal pain). 10/29/15  Yes Merrily Pew, MD  DULoxetine (CYMBALTA) 60 MG capsule Take 1 capsule (60 mg total) by mouth daily. 05/20/16  Yes Norman Clay, MD  fish oil-omega-3 fatty acids 1000 MG capsule Take 1 g by mouth daily.   Yes  Historical Provider, MD  fluticasone (FLONASE) 50 MCG/ACT nasal spray Place 2 sprays into the nose daily.    Yes Historical Provider, MD  folic acid (FOLVITE) 366 MCG tablet Take 800 mcg by mouth daily.   Yes Historical Provider, MD  furosemide (LASIX) 40 MG tablet Take 40 mg by mouth daily.    Yes Historical Provider, MD  insulin aspart (NOVOLOG) 100 UNIT/ML injection Inject into the skin continuous. Daily Bolus of 2.9 units every hour   Yes Historical Provider, MD  lamoTRIgine (LAMICTAL) 100 MG tablet Take 1 tablet (100 mg total) by mouth daily. 05/21/16  Yes Norman Clay, MD  levothyroxine (SYNTHROID, LEVOTHROID) 150 MCG tablet Take 150 mcg by mouth daily before breakfast.   Yes Historical Provider, MD  lubiprostone (AMITIZA) 8 MCG capsule Take 8 mcg by mouth 2 (two) times daily.   Yes Historical Provider, MD  medroxyPROGESTERone (PROVERA) 10 MG tablet Take 20 mg by mouth as directed. Only takes the first 5 days of every third month   Yes Historical Provider, MD  omeprazole (PRILOSEC) 20 MG capsule Take 20 mg by mouth at bedtime.   Yes Historical Provider, MD  pravastatin (PRAVACHOL) 80 MG tablet Take 80 mg by mouth at bedtime.   Yes Historical Provider, MD  Psyllium (METAMUCIL FIBER PO) Take by mouth daily.   Yes Historical Provider, MD  SALINE NASAL MIST NA Place into the nose every morning.   Yes Historical Provider, MD  Simethicone (PHAZYME MAXIMUM STRENGTH) 250 MG CAPS Take 250 mg by mouth as needed.   Yes Historical Provider, MD  tiZANidine (ZANAFLEX) 4 MG capsule Take 4 mg by mouth 3 (three) times daily.   Yes Historical Provider, MD  traMADol (ULTRAM) 50 MG tablet Take 50 mg by mouth 2 (two) times daily.   Yes Historical Provider, MD  promethazine (PHENERGAN) 25 MG tablet Take 1 tablet (25 mg total) by mouth every 6 (six) hours as needed for nausea or vomiting. 06/12/16   [name], PA-C    Family History Family History  Problem Relation Age of Onset  . Asthma Mother   . Heart  disease Father   . Cancer Paternal Grandmother     lung and breast  . Bladder Cancer Paternal Grandfather     Social History Social History  Substance Use Topics  . Smoking status: Former Smoker    Packs/day: 0.50    Years: 20.00    Types: Cigarettes  . Smokeless tobacco: Never Used  . Alcohol use No     Comment: 05-20-16 per pt no     Allergies   Ciprofloxacin; Buspar [buspirone]; Advair diskus [fluticasone-salmeterol]; Levaquin [levofloxacin]; Ondansetron;  Biaxin [clarithromycin]; and Hydroxyzine   Review of Systems Review of Systems  Constitutional: Negative for fever.  HENT: Negative for congestion and sore throat.   Eyes: Negative.   Respiratory: Negative for chest tightness and shortness of breath.   Cardiovascular: Negative for chest pain.  Gastrointestinal: Positive for nausea. Negative for abdominal pain.  Genitourinary: Negative.   Musculoskeletal: Positive for myalgias. Negative for arthralgias, joint swelling and neck pain.  Skin: Negative.  Negative for rash and wound.  Neurological: Negative for dizziness, weakness, light-headedness, numbness and headaches.  Psychiatric/Behavioral: Negative.      Physical Exam Updated Vital Signs BP 123/56 (BP Location: Right Arm)   Pulse 72   Temp 97.8 F (36.6 C) (Oral)   Resp 18   Ht _0  (1.753 m)   Wt 131.5 kg   LMP 03/12/2016 (Approximate) Comment: pt takes provera every 3 months for a period.  SpO2 94%   BMI 42.83 kg/m   Physical Exam  Constitutional: She appears well-developed and well-nourished.  HENT:  Head: Normocephalic and atraumatic.  Eyes: Conjunctivae are normal.  Neck: Normal range of motion.  Cardiovascular: Normal rate, regular rhythm, normal heart sounds and intact distal pulses.   Pulmonary/Chest: Effort normal and breath sounds normal. She has no wheezes.  Abdominal: Soft. Bowel sounds are normal. There is no tenderness.  Musculoskeletal: Normal range of motion.  Neurological: She is  alert.  Skin: Skin is warm and dry.  Psychiatric: She has a normal mood and affect.  Nursing note and vitals reviewed.    ED Treatments / Results  Labs (all labs ordered are listed, but only abnormal results are displayed) Labs Reviewed  URINALYSIS, ROUTINE W REFLEX MICROSCOPIC - Abnormal; Notable for the following:       Result Value   Color, Urine COLORLESS (*)    Glucose, UA >=500 (*)    Ketones, ur 5 (*)    Bacteria, UA RARE (*)    All other components within normal limits  BASIC METABOLIC PANEL - Abnormal; Notable for the following:    Sodium 127 (*)    Chloride 95 (*)    Glucose, Bld 600 (*)    BUN 23 (*)    Creatinine, Ser 1.69 (*)    Calcium 8.7 (*)    GFR calc non Af Amer 36 (*)    GFR calc Af Amer 41 (*)    All other components within normal limits  CBG MONITORING, ED - Abnormal; Notable for the following:    Glucose-Capillary 570 (*)    All other components within normal limits  CBG MONITORING, ED - Abnormal; Notable for the following:    Glucose-Capillary 457 (*)    All other components within normal limits  CBG MONITORING, ED - Abnormal; Notable for the following:    Glucose-Capillary 395 (*)    All other components within normal limits  CBG MONITORING, ED - Abnormal; Notable for the following:    Glucose-Capillary 288 (*)    All other components within normal limits  CBC WITH DIFFERENTIAL/PLATELET    EKG  EKG Interpretation None       Radiology No results found.  Procedures Procedures (including critical care time)  Medications Ordered in ED Medications  sodium chloride 0.9 % bolus 1,000 mL (0 mLs Intravenous Stopped 06/12/16 1028)  promethazine (PHENERGAN) injection 12.5 mg (12.5 mg Intravenous Given 06/12/16 0943)     Initial Impression / Assessment and Plan / ED Course  I have reviewed the triage vital signs and the  nursing notes.  Pertinent labs & imaging results that were available during my care of the patient were reviewed by me and  considered in my medical decision making (see chart for details).  Clinical Course     Patient was given IV fluids and Phenergan.  CBC and be met pending.  Will recheck CBG once labs have resulted.  Will hold given insulin at this time since patient treated herself prior to arrival, bolus through her pump.  Labs reviewed, pt is not acidotic.  Has tolerated PO fluids.  Repeat cbg 457 after first bolus.  Given second bolus,  Pt repeated her insulin aspart bolus through her pump, 6.9 units, then additional bolus.  Once cbg was less than 300 she was dc'd home in stable condition.  Advised prn f/u with any problems or concerns. Pt was seen by Dr. Rogene Houston during this visit.  Final Clinical Impressions(s) / ED Diagnoses   Final diagnoses:  Hyperglycemia due to type 1 diabetes mellitus [facility])    New Prescriptions Discharge Medication List as of 06/12/2016  1:26 PM       [name], PA-C 06/12/16 1450    Fredia Sorrow, MD 06/15/16 [phone]

## 2016-06-12 NOTE — ED Notes (Signed)
Pt given another self bolus of 4.2. Approved by [name]

## 2016-06-12 NOTE — ED Notes (Signed)
ED Provider at bedside. 

## 2016-06-12 NOTE — ED Triage Notes (Signed)
Pt reports is on an insulin pump and reports she woke up early this morning with it alarming due to an occlusion.  Says she thought she fixed it but when she woke up a second time, it was occluded again.  Pt changed her set up and says pump is working fine now but sugar is reading high.  Pt c/o nausea and feeling tense in her muscles.  Reports her sugar was very low last night because she had done more activity yesterday than usual.

## 2016-06-12 NOTE — ED Notes (Signed)
Pt tolerated well, denied nausea/vomitting

## 2016-06-12 NOTE — ED Provider Notes (Addendum)
Medical screening examination/treatment/procedure(s) were conducted as a shared visit with non-physician practitioner(s) and myself.  I personally evaluated the patient during the encounter.   EKG Interpretation None      Results for orders placed or performed during the hospital encounter of 06/12/16  Urinalysis, Routine w reflex microscopic  Result Value Ref Range   Color, Urine COLORLESS (A) YELLOW   APPearance CLEAR CLEAR   Specific Gravity, Urine 1.009 1.005 - 1.030   pH 6.0 5.0 - 8.0   Glucose, UA >=500 (A) NEGATIVE mg/dL   Hgb urine dipstick NEGATIVE NEGATIVE   Bilirubin Urine NEGATIVE NEGATIVE   Ketones, ur 5 (A) NEGATIVE mg/dL   Protein, ur NEGATIVE NEGATIVE mg/dL   Nitrite NEGATIVE NEGATIVE   Leukocytes, UA NEGATIVE NEGATIVE   RBC / HPF 0-5 0 - 5 RBC/hpf   WBC, UA 0-5 0 - 5 WBC/hpf   Bacteria, UA RARE (A) NONE SEEN  CBC with Differential  Result Value Ref Range   WBC 7.7 4.0 - 10.5 K/uL   RBC 4.57 3.87 - 5.11 MIL/uL   Hemoglobin 13.7 12.0 - 15.0 g/dL   HCT 41.8 36.0 - 46.0 %   MCV 91.5 78.0 - 100.0 fL   MCH 30.0 26.0 - 34.0 pg   MCHC 32.8 30.0 - 36.0 g/dL   RDW 13.3 11.5 - 15.5 %   Platelets 228 150 - 400 K/uL   Neutrophils Relative % 66 %   Neutro Abs 5.1 1.7 - 7.7 K/uL   Lymphocytes Relative 24 %   Lymphs Abs 1.9 0.7 - 4.0 K/uL   Monocytes Relative 6 %   Monocytes Absolute 0.4 0.1 - 1.0 K/uL   Eosinophils Relative 3 %   Eosinophils Absolute 0.3 0.0 - 0.7 K/uL   Basophils Relative 1 %   Basophils Absolute 0.1 0.0 - 0.1 K/uL  Basic metabolic panel  Result Value Ref Range   Sodium 127 (L) 135 - 145 mmol/L   Potassium 4.6 3.5 - 5.1 mmol/L   Chloride 95 (L) 101 - 111 mmol/L   CO2 24 22 - 32 mmol/L   Glucose, Bld 600 (HH) 65 - 99 mg/dL   BUN 23 (H) 6 - 20 mg/dL   Creatinine, Ser 1.69 (H) 0.44 - 1.00 mg/dL   Calcium 8.7 (L) 8.9 - 10.3 mg/dL   GFR calc non Af Amer 36 (L) >60 mL/min   GFR calc Af Amer 41 (L) >60 mL/min   Anion gap 8 5 - 15  CBG monitoring,  ED  Result Value Ref Range   Glucose-Capillary 570 (HH) 65 - 99 mg/dL   Comment 1 Notify RN   POC CBG, ED  Result Value Ref Range   Glucose-Capillary 457 (H) 65 - 99 mg/dL  CBG monitoring, ED  Result Value Ref Range   Glucose-Capillary 395 (H) 65 - 99 mg/dL    Patient seen by me along with the physician assistant. Patient known insulin-dependent diabetic hasn't one soon pump as well as an implanted the glucose meter. That works with an apple on her phone. Patient had difficulty with her pump working out 2 overnight. Patient's sugar levels got high. Pump is now working. Patient did give her an self insulin bolus. With fluids here. Blood sugar is coming down from the 600 range down into the 300 range. We'll continue to monitor make sure blood sugars get down in the low 300s or upper 200s prior to discharge. Patient understands her illness very well understands how to work her insulin pump  very well. Patient nontoxic no acute distress no evidence of metabolic acidosis.  On exam patient alert and oriented. Lungs clear bilaterally heart regular rate and rhythm abdomen soft and nontender.   Fredia Sorrow, MD 06/12/16 1230   Medical screening examination/treatment/procedure(s) were conducted as a shared visit with non-physician practitioner(s) and myself.  I personally evaluated the patient during the encounter.   EKG Interpretation None        Fredia Sorrow, MD 06/15/16 1601    Fredia Sorrow, MD 06/15/16 [phone]

## 2016-06-15 ENCOUNTER — Ambulatory Visit: Payer: BLUE CROSS/BLUE SHIELD | Attending: Physician Assistant | Admitting: Physical Therapy

## 2016-06-15 DIAGNOSIS — M5442 Lumbago with sciatica, left side: Secondary | ICD-10-CM | POA: Insufficient documentation

## 2016-06-15 DIAGNOSIS — R293 Abnormal posture: Secondary | ICD-10-CM | POA: Insufficient documentation

## 2016-06-15 DIAGNOSIS — M5416 Radiculopathy, lumbar region: Secondary | ICD-10-CM | POA: Diagnosis present

## 2016-06-15 DIAGNOSIS — M6281 Muscle weakness (generalized): Secondary | ICD-10-CM | POA: Insufficient documentation

## 2016-06-15 NOTE — Patient Instructions (Signed)
Piriformis Stretch    Lying on back, pull right knee toward opposite shoulder. Hold ___30_ seconds. Repeat _3___ times. Do _2-3___ sessions per day.

## 2016-06-15 NOTE — Therapy (Signed)
[facility] [address]  [place], [place], [address] Phone: [phone]   Fax:  [phone]  Physical Therapy Treatment  Patient Details  Name: Jasmine [name] MRN: [id number] Date of Birth: [birth year] Referring Provider: Marella Chimes, PA  Encounter Date: 06/15/2016      PT End of Session - 06/15/16 1046    Visit Number 3   Number of Visits 12   Date for PT Re-Evaluation 07/13/16   PT Start Time 9485   PT Stop Time 1124   PT Time Calculation (min) 41 min   Activity Tolerance Patient tolerated treatment well   Behavior During Therapy [facility] for tasks assessed/performed      Past Medical History:  Diagnosis Date  . Anxiety   . Bipolar disorder ([place])   . CKD (chronic kidney disease), stage II   . Depression   . Diabetes mellitus   . DKA, type 1 ([place]) 11/04/2011  . Elevated cholesterol   . GERD (gastroesophageal reflux disease)   . Hypertension   . Hypothyroidism   . IBS (irritable bowel syndrome)   . Obesity   . Sleep apnea    HAS C -PAP / DOES NOT USE  . Stress incontinence   . Tobacco abuse   . UTI (lower urinary tract infection)     Past Surgical History:  Procedure Laterality Date  . INCONTINENCE SURGERY    . NASAL FRACTURE SURGERY    . ovary removed    . PUBOVAGINAL SLING  08/16/2011   Procedure: [name];  Surgeon: Bernestine Amass, MD;  Location: [facility];  Service: Urology;  Laterality: N/A;         . UTERINE FIBROID SURGERY  2001    There were no vitals filed for this visit.      Subjective Assessment - 06/15/16 1044    Subjective Patient reporting LBP "isn't too bad" - feels like pain has been more in L hip recently   Pertinent History DDD of cervical and lumbar region, CKD stage III, type I DM, bipolar disorder, IBS   Patient Stated Goals less pain   Currently in Pain? Yes   Pain Score 3    Pain Location Back   Pain Orientation Left;Lower   Pain Descriptors / Indicators  Aching  intermittent sharp/stabbing   Pain Type Chronic pain   Pain Onset More than a month ago   Pain Frequency Intermittent   Aggravating Factors  housework, prolonged sitting or standing   Pain Relieving Factors rest, pain meds                         [facility] Adult PT Treatment/Exercise - 06/15/16 1047      Lumbar Exercises: Supine   Ab Set 15 reps;5 seconds   AB Set Limitations patient in hooklying; progression to alternating LE marches x 15; progression to arms overhead x 15   Clam 15 reps   Clam Limitations B LE with green tband at knees - ab set prior to movement   Bridge 15 reps;3 seconds   Bridge Limitations green tband at knees     Knee/Hip Exercises: Hydrologist Left;3 reps;30 seconds   Passive Hamstring Stretch Limitations supine with strap   ITB Stretch Left;3 reps;30 seconds   ITB Stretch Limitations supine with strap   Piriformis Stretch Left;3 reps;30 seconds   Piriformis Stretch Limitations supine - knee towards armpit  Knee/Hip Exercises: Aerobic   Nustep level 4 x 7 minutes     Knee/Hip Exercises: Sidelying   Clams L sidelying clam shell with green TB x 15     Manual Therapy   Manual therapy comments red medball to L lateral thigh/hip/glute region - some pain with palpation directly on greater trochanter                     PT Long Term Goals - 06/01/16 1453      PT LONG TERM GOAL #1   Title patient to be independent with HEP (07/13/16)   Status On-going     PT LONG TERM GOAL #2   Title Patient to improve L hip strength to >/= 4+/5 (07/13/16)   Status On-going     PT LONG TERM GOAL #3   Title Patient to improve L lumbar lateral flexion and R rotation ROM with no increase in pain (07/13/16)   Status On-going     PT LONG TERM GOAL #4   Title Patient to report ability to stand/walk for > 30 minutes with no increase in pain (07/13/16)   Status On-going     PT LONG TERM GOAL #5   Title Patient to  demonstrate proper postural alignment with good core engagement (07/13/16)   Status On-going               Plan - 06/15/16 1046    Clinical Impression Statement Patient today with subjective reports of minimal low back pain with pain primarily located in L hip. Patient progressing all core stabilization tasks today as well as gentle hip strengthening with no pain reproduction. Discussion with patient regarding modalities for reduced pain with patient to consider in the futire. Patient with continued poor HEP compliance due to family related issues. Piriformis stretch added to HEP today. Patient to continue to benefit from PT to maximize function.    Clinical Impairments Affecting Rehab Potential DDD of cervical and lumbar region, CKD stage III, type I DM, bipolar disorder, IBS   PT Treatment/Interventions ADLs/Self Care Home Management;Cryotherapy;Electrical Stimulation;Moist Heat;Ultrasound;Neuromuscular re-education;Balance training;Therapeutic exercise;Therapeutic activities;Functional mobility training;Stair training;Gait training;Patient/family education;Manual techniques;Passive range of motion;Vasopneumatic Device;Taping;Dry needling   PT Next Visit Plan L-sided hip strengthening and stretching; lumbopelvic/core stability training; HEP review prn, modalities prn for pain relief   Consulted and Agree with Plan of Care Patient      Patient will benefit from skilled therapeutic intervention in order to improve the following deficits and impairments:  Pain, Decreased mobility, Decreased endurance, Decreased strength, Decreased activity tolerance  Visit Diagnosis: Radiculopathy, lumbar region  Left-sided low back pain with left-sided sciatica, unspecified chronicity  Abnormal posture  Muscle weakness (generalized)     Problem List Patient Active Problem List   Diagnosis Date Noted  . Hyperglycemia 12/25/2012  . Acute on chronic renal failure ([place]) 12/25/2012  . Pulmonary  infiltrates 10/02/2012  . Postnasal drip 10/02/2012  . Hyperkalemia 09/25/2012  . Chronic kidney disease, stage III (moderate) 09/25/2012  . Morbid obesity ([place]) 09/24/2012  . HTN (hypertension) 09/24/2012  . Bipolar II disorder ([place]) 09/24/2012  . URI (upper respiratory infection) 09/24/2012  . DKA, type 1 ([place]) 11/04/2011  . Gastroenteritis 11/03/2011  . DM (diabetes mellitus), type 1, uncontrolled ([place]) 11/03/2011  . Hyponatremia 11/03/2011  . CKD (chronic kidney disease), stage II 11/03/2011  . Elevated lipase 11/03/2011  . Hypothyroidism 11/03/2011  . Tobacco abuse 11/03/2011  . SUI (stress urinary incontinence, female) 08/16/2011  Lanney Gins, PT, DPT 06/15/16 11:43 AM   [facility] [address]  [place], [place], [address] Phone: [phone]   Fax:  [phone]  Name: Jasmine [name] MRN: [id number] Date of Birth: 10-21-1970

## 2016-06-18 ENCOUNTER — Ambulatory Visit: Payer: BLUE CROSS/BLUE SHIELD | Admitting: Physical Therapy

## 2016-06-18 DIAGNOSIS — M6281 Muscle weakness (generalized): Secondary | ICD-10-CM

## 2016-06-18 DIAGNOSIS — R293 Abnormal posture: Secondary | ICD-10-CM

## 2016-06-18 DIAGNOSIS — M5442 Lumbago with sciatica, left side: Secondary | ICD-10-CM

## 2016-06-18 DIAGNOSIS — M5416 Radiculopathy, lumbar region: Secondary | ICD-10-CM

## 2016-06-18 NOTE — Patient Instructions (Signed)
Hip Flexion, Knee Straight    Lift right straight leg forward and up  Repeat ___15_ times per session.    ABDUCTION: Standing (Active)    Stand, feet flat. Lift right leg out to side. . Complete _1__ sets of _15_ repetitions.    EXTENSION: Standing (Active)    Stand, both feet flat. Draw right leg behind body as far as possible.  Complete __1_ sets of __15_ repetitions.      ADDUCTION: Standing - Unstable (Active)    Left  leg out to side as far as possible. Draw leg in across midline.  Complete _1__ sets of __15_ repetitions.

## 2016-06-18 NOTE — Therapy (Signed)
[facility] [address]  [place], [place], [address] Phone: [phone]   Fax:  [phone]  Physical Therapy Treatment  Patient Details  Name: Jasmine [name] MRN: [id number] Date of Birth: 09/02/1970 Referring Provider: Marella Chimes, PA  Encounter Date: 06/18/2016      PT End of Session - 06/18/16 1103    Visit Number 4   Number of Visits 12   Date for PT Re-Evaluation 07/13/16   PT Start Time 1102   PT Stop Time 1142   PT Time Calculation (min) 40 min   Activity Tolerance Patient tolerated treatment well   Behavior During Therapy [facility] for tasks assessed/performed      Past Medical History:  Diagnosis Date  . Anxiety   . Bipolar disorder ([place])   . CKD (chronic kidney disease), stage II   . Depression   . Diabetes mellitus   . DKA, type 1 ([place]) 11/04/2011  . Elevated cholesterol   . GERD (gastroesophageal reflux disease)   . Hypertension   . Hypothyroidism   . IBS (irritable bowel syndrome)   . Obesity   . Sleep apnea    HAS C -PAP / DOES NOT USE  . Stress incontinence   . Tobacco abuse   . UTI (lower urinary tract infection)     Past Surgical History:  Procedure Laterality Date  . INCONTINENCE SURGERY    . NASAL FRACTURE SURGERY    . ovary removed    . PUBOVAGINAL SLING  08/16/2011   Procedure: [name];  Surgeon: Bernestine Amass, MD;  Location: [facility];  Service: Urology;  Laterality: N/A;         . UTERINE FIBROID SURGERY  2001    There were no vitals filed for this visit.      Subjective Assessment - 06/18/16 1103    Subjective Feeling well today - no pain in back - typical hip pain; had sharp pain in L low back yesterday that made her stop momentarily   Pertinent History DDD of cervical and lumbar region, CKD stage III, type I DM, bipolar disorder, IBS   Patient Stated Goals less pain   Currently in Pain? No/denies   Pain Score 0-No pain                          [facility] Adult PT Treatment/Exercise - 06/18/16 1106      Exercises   Exercises Knee/Hip;Lumbar     Lumbar Exercises: Standing   Other Standing Lumbar Exercises pallof press - B with green tband 15 reps x 2 sets each     Lumbar Exercises: Supine   Clam 15 reps   Clam Limitations B LE with blue tband at knees - ab set prior to movement   Bridge 15 reps;5 seconds   Bridge Limitations blue tband at ankles   Other Supine Lumbar Exercises seated BATCA rows - 25# x 15 reps with 5 second hold     Knee/Hip Exercises: Stretches   Passive Hamstring Stretch Left;3 reps;30 seconds   Passive Hamstring Stretch Limitations supine with strap   ITB Stretch Left;3 reps;30 seconds   ITB Stretch Limitations supine with strap     Knee/Hip Exercises: Aerobic   Nustep level 6 x 6 minutes     Knee/Hip Exercises: Standing   Hip Flexion Stengthening;Left;Knee straight   Hip Flexion Limitations 12 reps; green tband with B poles   Hip ADduction Strengthening;Left  Hip ADduction Limitations 12 reps; green tband with B poles   Hip Abduction Stengthening;Left;Knee straight   Abduction Limitations 12 reps; green tband with B poles   Hip Extension Stengthening;Left;Knee straight   Extension Limitations 12 reps; green tband with B poles                     PT Long Term Goals - 06/01/16 1453      PT LONG TERM GOAL #1   Title patient to be independent with HEP (07/13/16)   Status On-going     PT LONG TERM GOAL #2   Title Patient to improve L hip strength to >/= 4+/5 (07/13/16)   Status On-going     PT LONG TERM GOAL #3   Title Patient to improve L lumbar lateral flexion and R rotation ROM with no increase in pain (07/13/16)   Status On-going     PT LONG TERM GOAL #4   Title Patient to report ability to stand/walk for > 30 minutes with no increase in pain (07/13/16)   Status On-going     PT LONG TERM GOAL #5   Title Patient to demonstrate proper postural  alignment with good core engagement (07/13/16)   Status On-going               Plan - 06/18/16 1106    Clinical Impression Statement Patient today with continued reduced low back pain, but with reamining hip pain. Patient today progressing hip and core strengthening with no increase in pain with good muscle control. Patient issued HEP progression today with good carryover. Patient to continue to benefit from PT to maximize function.    Clinical Impairments Affecting Rehab Potential DDD of cervical and lumbar region, CKD stage III, type I DM, bipolar disorder, IBS   PT Treatment/Interventions ADLs/Self Care Home Management;Cryotherapy;Electrical Stimulation;Moist Heat;Ultrasound;Neuromuscular re-education;Balance training;Therapeutic exercise;Therapeutic activities;Functional mobility training;Stair training;Gait training;Patient/family education;Manual techniques;Passive range of motion;Vasopneumatic Device;Taping;Dry needling   PT Next Visit Plan L-sided hip strengthening and stretching; lumbopelvic/core stability training; HEP review prn, modalities prn for pain relief   Consulted and Agree with Plan of Care Patient      Patient will benefit from skilled therapeutic intervention in order to improve the following deficits and impairments:  Pain, Decreased mobility, Decreased endurance, Decreased strength, Decreased activity tolerance  Visit Diagnosis: Radiculopathy, lumbar region  Left-sided low back pain with left-sided sciatica, unspecified chronicity  Abnormal posture  Muscle weakness (generalized)     Problem List Patient Active Problem List   Diagnosis Date Noted  . Hyperglycemia 12/25/2012  . Acute on chronic renal failure ([place]) 12/25/2012  . Pulmonary infiltrates 10/02/2012  . Postnasal drip 10/02/2012  . Hyperkalemia 09/25/2012  . Chronic kidney disease, stage III (moderate) 09/25/2012  . Morbid obesity ([place]) 09/24/2012  . HTN (hypertension) 09/24/2012  . Bipolar  II disorder ([place]) 09/24/2012  . URI (upper respiratory infection) 09/24/2012  . DKA, type 1 ([place]) 11/04/2011  . Gastroenteritis 11/03/2011  . DM (diabetes mellitus), type 1, uncontrolled ([place]) 11/03/2011  . Hyponatremia 11/03/2011  . CKD (chronic kidney disease), stage II 11/03/2011  . Elevated lipase 11/03/2011  . Hypothyroidism 11/03/2011  . Tobacco abuse 11/03/2011  . SUI (stress urinary incontinence, female) 08/16/2011     Lanney Gins, PT, DPT 06/18/16 11:47 AM   [facility] [address]  [place], [place], [address] Phone: [phone]   Fax:  [phone]  Name: JODA BRAATZ MRN: [id number] Date  of Birth: 1971/03/02

## 2016-06-22 ENCOUNTER — Ambulatory Visit: Payer: BLUE CROSS/BLUE SHIELD

## 2016-06-22 DIAGNOSIS — M5442 Lumbago with sciatica, left side: Secondary | ICD-10-CM

## 2016-06-22 DIAGNOSIS — M5416 Radiculopathy, lumbar region: Secondary | ICD-10-CM

## 2016-06-22 DIAGNOSIS — R293 Abnormal posture: Secondary | ICD-10-CM

## 2016-06-22 DIAGNOSIS — M6281 Muscle weakness (generalized): Secondary | ICD-10-CM

## 2016-06-22 NOTE — Therapy (Signed)
[facility] [address]  [place], [place], [address] Phone: [phone]   Fax:  [phone]  Physical Therapy Treatment  Patient Details  Name: Jasmine [name] MRN: [id number] Date of Birth: Jul 04, 1970 Referring Provider: Marella Chimes, PA  Encounter Date: 06/22/2016      PT End of Session - 06/22/16 1457    Visit Number 5   Number of Visits 12   Date for PT Re-Evaluation 07/13/16   PT Start Time 2836   PT Stop Time 1532   PT Time Calculation (min) 44 min   Activity Tolerance Patient tolerated treatment well   Behavior During Therapy [facility] for tasks assessed/performed      Past Medical History:  Diagnosis Date  . Anxiety   . Bipolar disorder ([place])   . CKD (chronic kidney disease), stage II   . Depression   . Diabetes mellitus   . DKA, type 1 ([place]) 11/04/2011  . Elevated cholesterol   . GERD (gastroesophageal reflux disease)   . Hypertension   . Hypothyroidism   . IBS (irritable bowel syndrome)   . Obesity   . Sleep apnea    HAS C -PAP / DOES NOT USE  . Stress incontinence   . Tobacco abuse   . UTI (lower urinary tract infection)     Past Surgical History:  Procedure Laterality Date  . INCONTINENCE SURGERY    . NASAL FRACTURE SURGERY    . ovary removed    . PUBOVAGINAL SLING  08/16/2011   Procedure: [name];  Surgeon: Bernestine Amass, MD;  [facility];  Service: Urology;  Laterality: N/A;         . UTERINE FIBROID SURGERY  2001    There were no vitals filed for this visit.      Subjective Assessment - 06/22/16 1450    Subjective Pt. reporting she feels therapy is helping with a lower average pain level at lower back over last few days.     Patient Stated Goals less pain   Currently in Pain? No/denies   Pain Score 0-No pain   Aggravating Factors  lifting, stooping over,    Multiple Pain Sites No            [facility] Adult PT Treatment/Exercise - 06/22/16 1509       Lumbar Exercises: Stretches   Passive Hamstring Stretch 2 reps;30 seconds   Passive Hamstring Stretch Limitations with therapist    Quad Stretch 2 reps;30 seconds   Quad Stretch Limitations L in Mod Thomas Pos    ITB Stretch 2 reps;30 seconds   ITB Stretch Limitations with therapist    Piriformis Stretch 2 reps;30 seconds   Piriformis Stretch Limitations with therapist      Lumbar Exercises: Supine   Ab Set 15 reps;5 seconds   AB Set Limitations hooklying   Clam 15 reps   Clam Limitations B LE with blue tband at knees - ab set prior to movement   Bridge 15 reps;5 seconds   Bridge Limitations blue tband at knees     Lumbar Exercises: Sidelying   Clam 15 reps;3 seconds   Clam Limitations with blue TB      Knee/Hip Exercises: Standing   Hip Flexion Stengthening;Knee straight;Left;Right   Hip Flexion Limitations x 12 reps; alternating with green looped TB    Hip ADduction Strengthening;Left   Hip ADduction Limitations x 12 reps; with green looped TB; counter    Hip Abduction Stengthening;Left;Knee  straight   Abduction Limitations x 12 reps; looped green TB; counter    Hip Extension Stengthening;Left;Knee straight   Extension Limitations x 12 reps; green looped TB; counter    Functional Squat 1 set;15 reps   Functional Squat Limitations Shallow on TRX                 PT Education - 06/22/16 1457    Education provided Yes   Education Details posture and body mechanics education   Person(s) Educated Patient   Methods Explanation;Demonstration;Handout   Comprehension Verbalized understanding;Returned demonstration;Verbal cues required;Need further instruction             PT Long Term Goals - 06/01/16 1453      PT LONG TERM GOAL #1   Title patient to be independent with HEP (07/13/16)   Status On-going     PT LONG TERM GOAL #2   Title Patient to improve L hip strength to >/= 4+/5 (07/13/16)   Status On-going     PT LONG TERM GOAL #3   Title Patient to improve L  lumbar lateral flexion and R rotation ROM with no increase in pain (07/13/16)   Status On-going     PT LONG TERM GOAL #4   Title Patient to report ability to stand/walk for > 30 minutes with no increase in pain (07/13/16)   Status On-going     PT LONG TERM GOAL #5   Title Patient to demonstrate proper postural alignment with good core engagement (07/13/16)   Status On-going               Plan - 06/22/16 1508    Clinical Impression Statement Pt. reporting she has been pain free at lower back over the last few days and feels therapy is helping at this point.  Functional squat added today and standing SLR with green TB continued without issue.  Pt. pain free throughout treatment today with exception of mild R knee pain with mini squat on TRX, which quickly resolved.  Pt. reporting she still has LBP with stooping and vacuuming at [employer].  Body mechanics and postural education handout reviewed with pt. today and issued to pt. to end treatment.  Pt. seems to be progressing well at this point and reports adherence to HEP.     PT Treatment/Interventions ADLs/Self Care Home Management;Cryotherapy;Electrical Stimulation;Moist Heat;Ultrasound;Neuromuscular re-education;Balance training;Therapeutic exercise;Therapeutic activities;Functional mobility training;Stair training;Gait training;Patient/family education;Manual techniques;Passive range of motion;Vasopneumatic Device;Taping;Dry needling   PT Next Visit Plan L-sided hip strengthening and stretching; lumbopelvic/core stability training; HEP review prn, modalities prn for pain relief      Patient will benefit from skilled therapeutic intervention in order to improve the following deficits and impairments:  Pain, Decreased mobility, Decreased endurance, Decreased strength, Decreased activity tolerance  Visit Diagnosis: Radiculopathy, lumbar region  Left-sided low back pain with left-sided sciatica, unspecified chronicity  Abnormal  posture  Muscle weakness (generalized)     Problem List Patient Active Problem List   Diagnosis Date Noted  . Hyperglycemia 12/25/2012  . Acute on chronic renal failure ([place]) 12/25/2012  . Pulmonary infiltrates 10/02/2012  . Postnasal drip 10/02/2012  . Hyperkalemia 09/25/2012  . Chronic kidney disease, stage III (moderate) 09/25/2012  . Morbid obesity ([place]) 09/24/2012  . HTN (hypertension) 09/24/2012  . Bipolar II disorder ([place]) 09/24/2012  . URI (upper respiratory infection) 09/24/2012  . DKA, type 1 ([place]) 11/04/2011  . Gastroenteritis 11/03/2011  . DM (diabetes mellitus), type 1, uncontrolled ([place]) 11/03/2011  . Hyponatremia 11/03/2011  .  CKD (chronic kidney disease), stage II 11/03/2011  . Elevated lipase 11/03/2011  . Hypothyroidism 11/03/2011  . Tobacco abuse 11/03/2011  . SUI (stress urinary incontinence, female) 08/16/2011    Bess Harvest, PTA 06/22/16 3:41 PM  [facility] [address]  [place], [place], [address] Phone: [phone]   Fax:  [phone]  Name: Jasmine [name] MRN: [id number] Date of Birth: 1971-02-14

## 2016-06-22 NOTE — Patient Instructions (Signed)
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
## 2016-06-24 ENCOUNTER — Ambulatory Visit: Payer: BLUE CROSS/BLUE SHIELD | Admitting: Physical Therapy

## 2016-06-24 ENCOUNTER — Ambulatory Visit (HOSPITAL_COMMUNITY): Payer: Self-pay | Admitting: Psychiatry

## 2016-06-24 ENCOUNTER — Encounter: Payer: Self-pay | Admitting: Physical Therapy

## 2016-06-28 ENCOUNTER — Ambulatory Visit: Payer: BLUE CROSS/BLUE SHIELD | Admitting: Physical Therapy

## 2016-06-28 DIAGNOSIS — M6281 Muscle weakness (generalized): Secondary | ICD-10-CM

## 2016-06-28 DIAGNOSIS — M5416 Radiculopathy, lumbar region: Secondary | ICD-10-CM

## 2016-06-28 DIAGNOSIS — M5442 Lumbago with sciatica, left side: Secondary | ICD-10-CM

## 2016-06-28 DIAGNOSIS — R293 Abnormal posture: Secondary | ICD-10-CM

## 2016-06-28 NOTE — Therapy (Signed)
[facility] [place], [place], [address] Phone: [phone]   Fax:  [phone]  Physical Therapy Treatment  Patient Details  Name: Jasmine [name] MRN: [id number] Date of Birth: [birth year] Referring Provider: Marella Chimes, PA  Encounter Date: 06/28/2016      PT End of Session - 06/28/16 1538    Visit Number 6   Number of Visits 12   Date for PT Re-Evaluation 07/13/16   PT Start Time 0233   PT Stop Time 0327   PT Time Calculation (min) 54 min   Activity Tolerance Patient tolerated treatment well   Behavior During Therapy [facility] for tasks assessed/performed      Past Medical History:  Diagnosis Date  . Anxiety   . Bipolar disorder ([place])   . CKD (chronic kidney disease), stage II   . Depression   . Diabetes mellitus   . DKA, type 1 ([place]) 11/04/2011  . Elevated cholesterol   . GERD (gastroesophageal reflux disease)   . Hypertension   . Hypothyroidism   . IBS (irritable bowel syndrome)   . Obesity   . Sleep apnea    HAS C -PAP / DOES NOT USE  . Stress incontinence   . Tobacco abuse   . UTI (lower urinary tract infection)     Past Surgical History:  Procedure Laterality Date  . INCONTINENCE SURGERY    . NASAL FRACTURE SURGERY    . ovary removed    . PUBOVAGINAL SLING  08/16/2011   Procedure: [name];  Surgeon: Bernestine Amass, MD;  Location: [facility];  Service: Urology;  Laterality: N/A;         . UTERINE FIBROID SURGERY  2001    There were no vitals filed for this visit.      Subjective Assessment - 06/28/16 1452    Subjective Pain not too bad today.   Pain Score 2    Pain Location Back   Pain Orientation Left;Lower   Pain Descriptors / Indicators Aching   Pain Onset More than a month ago   Pain Frequency Intermittent                         [facility] Adult PT Treatment/Exercise - 06/28/16 0001      Modalities   Modalities Electrical Stimulation;Moist Heat;Ultrasound      Moist Heat Therapy   Number Minutes Moist Heat 15 Minutes   Moist Heat Location --  Left low back.     Acupuncturist Location --  Left low back.   Electrical Stimulation Action Constant pre-mod.   Electrical Stimulation Parameters 80-150 Hz x 15 minutes.   Electrical Stimulation Goals Pain     Ultrasound   Ultrasound Location Left low back.   Ultrasound Parameters 1.50 W/CM2 x 12 minutes.     Manual Therapy   Manual Therapy Soft tissue mobilization   Manual therapy comments Right sdly position with folded pillow between knees:  STW/M to left low back musculature including a QL release x 12 minutes.                     PT Long Term Goals - 06/01/16 1453      PT LONG TERM GOAL #1   Title patient to be independent with HEP (07/13/16)   Status On-going     PT LONG TERM GOAL #2   Title Patient to improve L hip strength to >/=  4+/5 (07/13/16)   Status On-going     PT LONG TERM GOAL #3   Title Patient to improve L lumbar lateral flexion and R rotation ROM with no increase in pain (07/13/16)   Status On-going     PT LONG TERM GOAL #4   Title Patient to report ability to stand/walk for > 30 minutes with no increase in pain (07/13/16)   Status On-going     PT LONG TERM GOAL #5   Title Patient to demonstrate proper postural alignment with good core engagement (07/13/16)   Status On-going               Plan - 06/28/16 1453    Clinical Impairments Affecting Rehab Potential DDD of cervical and lumbar region, CKD stage III, type I DM, bipolar disorder, IBS   PT Treatment/Interventions ADLs/Self Care Home Management;Cryotherapy;Electrical Stimulation;Moist Heat;Ultrasound;Neuromuscular re-education;Balance training;Therapeutic exercise;Therapeutic activities;Functional mobility training;Stair training;Gait training;Patient/family education;Manual techniques;Passive range of motion;Vasopneumatic Device;Taping;Dry needling      Patient  will benefit from skilled therapeutic intervention in order to improve the following deficits and impairments:  Pain, Decreased mobility, Decreased endurance, Decreased strength, Decreased activity tolerance  Visit Diagnosis: Radiculopathy, lumbar region  Left-sided low back pain with left-sided sciatica, unspecified chronicity  Abnormal posture  Muscle weakness (generalized)     Problem List Patient Active Problem List   Diagnosis Date Noted  . Hyperglycemia 12/25/2012  . Acute on chronic renal failure ([place]) 12/25/2012  . Pulmonary infiltrates 10/02/2012  . Postnasal drip 10/02/2012  . Hyperkalemia 09/25/2012  . Chronic kidney disease, stage III (moderate) 09/25/2012  . Morbid obesity ([place]) 09/24/2012  . HTN (hypertension) 09/24/2012  . Bipolar II disorder ([place]) 09/24/2012  . URI (upper respiratory infection) 09/24/2012  . DKA, type 1 ([place]) 11/04/2011  . Gastroenteritis 11/03/2011  . DM (diabetes mellitus), type 1, uncontrolled ([place]) 11/03/2011  . Hyponatremia 11/03/2011  . CKD (chronic kidney disease), stage II 11/03/2011  . Elevated lipase 11/03/2011  . Hypothyroidism 11/03/2011  . Tobacco abuse 11/03/2011  . SUI (stress urinary incontinence, female) 08/16/2011    Dorella Laster, [place] MPT 06/28/2016, 3:59 PM  [facility] [address] [place], [place], [address] Phone: [phone]   Fax:  [phone]  Name: Jasmine [name] MRN: [id number] Date of Birth: 09/22/1970
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
## 2016-06-30 ENCOUNTER — Ambulatory Visit: Payer: BLUE CROSS/BLUE SHIELD | Admitting: Physical Therapy

## 2016-06-30 ENCOUNTER — Encounter: Payer: Self-pay | Admitting: Physical Therapy

## 2016-06-30 DIAGNOSIS — M6281 Muscle weakness (generalized): Secondary | ICD-10-CM

## 2016-06-30 DIAGNOSIS — M5416 Radiculopathy, lumbar region: Secondary | ICD-10-CM

## 2016-06-30 DIAGNOSIS — R293 Abnormal posture: Secondary | ICD-10-CM

## 2016-06-30 DIAGNOSIS — M5442 Lumbago with sciatica, left side: Secondary | ICD-10-CM

## 2016-06-30 NOTE — Therapy (Signed)
[facility] [place], [place], [address] Phone: [phone]   Fax:  [phone]  Physical Therapy Treatment  Patient Details  Name: Jasmine [name] MRN: [id number] Date of Birth: January 06, 1971 Referring Provider: Marella Chimes, PA  Encounter Date: 06/30/2016      PT End of Session - 06/30/16 1350    Visit Number 7   Number of Visits 12   Date for PT Re-Evaluation 07/13/16   PT Start Time 1314   PT Stop Time 1400   PT Time Calculation (min) 46 min   Activity Tolerance Patient tolerated treatment well   Behavior During Therapy [facility] for tasks assessed/performed      Past Medical History:  Diagnosis Date  . Anxiety   . Bipolar disorder ([place])   . CKD (chronic kidney disease), stage II   . Depression   . Diabetes mellitus   . DKA, type 1 ([place]) 11/04/2011  . Elevated cholesterol   . GERD (gastroesophageal reflux disease)   . Hypertension   . Hypothyroidism   . IBS (irritable bowel syndrome)   . Obesity   . Sleep apnea    HAS C -PAP / DOES NOT USE  . Stress incontinence   . Tobacco abuse   . UTI (lower urinary tract infection)     Past Surgical History:  Procedure Laterality Date  . INCONTINENCE SURGERY    . NASAL FRACTURE SURGERY    . ovary removed    . PUBOVAGINAL SLING  08/16/2011   Procedure: [name];  Surgeon: Bernestine Amass, MD;  Location: [facility];  Service: Urology;  Laterality: N/A;         . UTERINE FIBROID SURGERY  2001    There were no vitals filed for this visit.      Subjective Assessment - 06/30/16 1319    Subjective Patient reported increased pain for unknown reason   Pertinent History DDD of cervical and lumbar region, CKD stage III, type I DM, bipolar disorder, IBS   Limitations Sitting;Standing;Walking   How long can you sit comfortably? 5 minutes   How long can you stand comfortably? 20 minutes   How long can you walk comfortably? 20 minutes   Diagnostic tests none   Currently in Pain? Yes   Pain Score 5    Pain Location Back   Pain Orientation Right;Left;Lower  most on left   Pain Descriptors / Indicators Aching   Pain Type Chronic pain   Pain Onset More than a month ago   Pain Frequency Intermittent   Aggravating Factors  prolong walking or activity   Pain Relieving Factors rest                         [facility] Adult PT Treatment/Exercise - 06/30/16 0001      Moist Heat Therapy   Number Minutes Moist Heat 15 Minutes   Moist Heat Location Lumbar Spine     Electrical Stimulation   Electrical Stimulation Location left low    Electrical Stimulation Action premod   Electrical Stimulation Parameters 80-150hz  x11mn   Electrical Stimulation Goals Pain     Ultrasound   Ultrasound Location left low back   Ultrasound Parameters 1.5w/cm2/50%/137m x 1042m  Ultrasound Goals Pain     Manual Therapy   Manual Therapy Soft tissue mobilization   Manual therapy comments Right sdly position with folded pillow between knees:  STW/M to left low back musculature including a QL  PT Long Term Goals - 06/30/16 1351      PT LONG TERM GOAL #1   Title patient to be independent with HEP (07/13/16)   Status Achieved  Patient doing HEP daily with good understanding 06/30/16     PT LONG TERM GOAL #2   Title Patient to improve L hip strength to >/= 4+/5 (07/13/16)   Status On-going     PT LONG TERM GOAL #3   Title Patient to improve L lumbar lateral flexion and R rotation ROM with no increase in pain (07/13/16)   Status On-going     PT LONG TERM GOAL #4   Title Patient to report ability to stand/walk for > 30 minutes with no increase in pain (07/13/16)   Status On-going     PT LONG TERM GOAL #5   Title Patient to demonstrate proper postural alignment with good core engagement (07/13/16)   Status On-going               Plan - 06/30/16 1353    Clinical Impression Statement Patient arrived with reports of  increased pain for unknown reason. Patient tolerated treatment well and felt better with decreased pain post treatment. Today reviewed HEP exercises and posture awareness techniques. Patient understands improtance of all proper technique and HEP's. Patient continues to have increased pain with certain ADL's such as washing dishes and increased pain from prolong standing or walking. Patient met LTG #1 other goals ongoing due to pain and strength deficts.   Rehab Potential Good   Clinical Impairments Affecting Rehab Potential DDD of cervical and lumbar region, CKD stage III, type I DM, bipolar disorder, IBS   PT Frequency 2x / week   PT Duration 6 weeks   PT Treatment/Interventions ADLs/Self Care Home Management;Cryotherapy;Electrical Stimulation;Moist Heat;Ultrasound;Neuromuscular re-education;Balance training;Therapeutic exercise;Therapeutic activities;Functional mobility training;Stair training;Gait training;Patient/family education;Manual techniques;Passive range of motion;Vasopneumatic Device;Taping;Dry needling   PT Next Visit Plan cont with POC for L-sided hip strengthening and stretching; lumbopelvic/core stability training; HEP review prn, modalities prn for pain relief   Consulted and Agree with Plan of Care Patient      Patient will benefit from skilled therapeutic intervention in order to improve the following deficits and impairments:  Pain, Decreased mobility, Decreased endurance, Decreased strength, Decreased activity tolerance  Visit Diagnosis: Radiculopathy, lumbar region  Left-sided low back pain with left-sided sciatica, unspecified chronicity  Abnormal posture  Muscle weakness (generalized)     Problem List Patient Active Problem List   Diagnosis Date Noted  . Hyperglycemia 12/25/2012  . Acute on chronic renal failure ([place]) 12/25/2012  . Pulmonary infiltrates 10/02/2012  . Postnasal drip 10/02/2012  . Hyperkalemia 09/25/2012  . Chronic kidney disease, stage III  (moderate) 09/25/2012  . Morbid obesity ([place]) 09/24/2012  . HTN (hypertension) 09/24/2012  . Bipolar II disorder ([place]) 09/24/2012  . URI (upper respiratory infection) 09/24/2012  . DKA, type 1 ([place]) 11/04/2011  . Gastroenteritis 11/03/2011  . DM (diabetes mellitus), type 1, uncontrolled ([place]) 11/03/2011  . Hyponatremia 11/03/2011  . CKD (chronic kidney disease), stage II 11/03/2011  . Elevated lipase 11/03/2011  . Hypothyroidism 11/03/2011  . Tobacco abuse 11/03/2011  . SUI (stress urinary incontinence, female) 08/16/2011    Phillips Climes, PTA 06/30/2016, 2:00 PM  [facility] [place], [place], [address] Phone: [phone]   Fax:  [phone]  Name: Jasmine [name] MRN: [id number] Date of Birth: [birth year]

## 2016-07-01 ENCOUNTER — Encounter (HOSPITAL_COMMUNITY): Payer: Self-pay | Admitting: Psychiatry

## 2016-07-01 ENCOUNTER — Ambulatory Visit (INDEPENDENT_AMBULATORY_CARE_PROVIDER_SITE_OTHER): Payer: BLUE CROSS/BLUE SHIELD | Admitting: Psychiatry

## 2016-07-01 VITALS — BP 135/85 | HR 63 | Ht 69.0 in | Wt 290.8 lb

## 2016-07-01 DIAGNOSIS — Z8249 Family history of ischemic heart disease and other diseases of the circulatory system: Secondary | ICD-10-CM

## 2016-07-01 DIAGNOSIS — Z8052 Family history of malignant neoplasm of bladder: Secondary | ICD-10-CM

## 2016-07-01 DIAGNOSIS — Z7982 Long term (current) use of aspirin: Secondary | ICD-10-CM

## 2016-07-01 DIAGNOSIS — F3181 Bipolar II disorder: Secondary | ICD-10-CM | POA: Diagnosis not present

## 2016-07-01 DIAGNOSIS — Z803 Family history of malignant neoplasm of breast: Secondary | ICD-10-CM

## 2016-07-01 DIAGNOSIS — Z9889 Other specified postprocedural states: Secondary | ICD-10-CM

## 2016-07-01 DIAGNOSIS — Z87891 Personal history of nicotine dependence: Secondary | ICD-10-CM

## 2016-07-01 DIAGNOSIS — Z79899 Other long term (current) drug therapy: Secondary | ICD-10-CM

## 2016-07-01 DIAGNOSIS — Z825 Family history of asthma and other chronic lower respiratory diseases: Secondary | ICD-10-CM

## 2016-07-01 DIAGNOSIS — Z888 Allergy status to other drugs, medicaments and biological substances status: Secondary | ICD-10-CM

## 2016-07-01 DIAGNOSIS — Z801 Family history of malignant neoplasm of trachea, bronchus and lung: Secondary | ICD-10-CM

## 2016-07-01 MED ORDER — LAMOTRIGINE 100 MG PO TABS: 100.0000 mg | ORAL_TABLET | Freq: Every day | ORAL | 0 refills | Status: DC

## 2016-07-01 MED ORDER — DULOXETINE HCL 60 MG PO CPEP: 60.0000 mg | ORAL_CAPSULE | Freq: Every day | ORAL | 0 refills | Status: DC

## 2016-07-01 MED ORDER — ZIPRASIDONE HCL 20 MG PO CAPS: 20.0000 mg | ORAL_CAPSULE | Freq: Two times a day (BID) | ORAL | 0 refills | Status: DC

## 2016-07-01 NOTE — Patient Instructions (Signed)
1. Continue lamotrigine 100 mg daily 2. Continue duloxetine 60 mg daily 3. Start ziprasidone 20 mg twice a day 4. Return to clinic next month 5. Continue to see Dr. Boris Lown for therapy

## 2016-07-05 ENCOUNTER — Ambulatory Visit: Payer: BLUE CROSS/BLUE SHIELD | Admitting: Physical Therapy

## 2016-07-05 DIAGNOSIS — M5442 Lumbago with sciatica, left side: Secondary | ICD-10-CM

## 2016-07-05 DIAGNOSIS — M6281 Muscle weakness (generalized): Secondary | ICD-10-CM

## 2016-07-05 DIAGNOSIS — R293 Abnormal posture: Secondary | ICD-10-CM

## 2016-07-05 DIAGNOSIS — M5416 Radiculopathy, lumbar region: Secondary | ICD-10-CM

## 2016-07-05 NOTE — Therapy (Signed)
[facility] [place], [place], [address] Phone: [phone]   Fax:  [phone]  Physical Therapy Treatment  Patient Details  Name: Jasmine [name] MRN: [id number] Date of Birth: 05-22-1971 Referring Provider: Marella Chimes, PA  Encounter Date: 07/05/2016      PT End of Session - 07/05/16 1527    PT Start Time 0230   PT Stop Time 0326   PT Time Calculation (min) 56 min   Activity Tolerance Patient tolerated treatment well   Behavior During Therapy [facility] for tasks assessed/performed      Past Medical History:  Diagnosis Date  . Anxiety   . Bipolar disorder ([place])   . CKD (chronic kidney disease), stage II   . Depression   . Diabetes mellitus   . DKA, type 1 ([place]) 11/04/2011  . Elevated cholesterol   . GERD (gastroesophageal reflux disease)   . Hypertension   . Hypothyroidism   . IBS (irritable bowel syndrome)   . Obesity   . Sleep apnea    HAS C -PAP / DOES NOT USE  . Stress incontinence   . Tobacco abuse   . UTI (lower urinary tract infection)     Past Surgical History:  Procedure Laterality Date  . INCONTINENCE SURGERY    . NASAL FRACTURE SURGERY    . ovary removed    . PUBOVAGINAL SLING  08/16/2011   Procedure: [name];  Surgeon: Bernestine Amass, MD;  Location: [facility];  Service: Urology;  Laterality: N/A;         . UTERINE FIBROID SURGERY  2001    There were no vitals filed for this visit.      Subjective Assessment - 07/05/16 1528    Subjective I had a good weekend and my pain is low today.   Pain Score 2    Pain Location Back   Pain Orientation Right;Lower   Pain Descriptors / Indicators Aching   Pain Type Chronic pain                         [facility] Adult PT Treatment/Exercise - 07/05/16 0001      Exercises   Exercises Knee/Hip     Lumbar Exercises: Aerobic   Stationary Bike Nustep level 5 x 13 minutes.     Lumbar Exercises: Sidelying   Other Sidelying  Lumbar Exercises Positional traction over small bolster in right sdly position x 10 minutes.     Moist Heat Therapy   Number Minutes Moist Heat 20 Minutes   Moist Heat Location --  Lumbar spine.     Acupuncturist Location --  Left low back.   Electrical Stimulation Action Constant Pre-mod.   Electrical Stimulation Parameters 80-150 Hz x 15 minutes.   Electrical Stimulation Goals Pain                     PT Long Term Goals - 06/30/16 1351      PT LONG TERM GOAL #1   Title patient to be independent with HEP (07/13/16)   Status Achieved  Patient doing HEP daily with good understanding 06/30/16     PT LONG TERM GOAL #2   Title Patient to improve L hip strength to >/= 4+/5 (07/13/16)   Status On-going     PT LONG TERM GOAL #3   Title Patient to improve L lumbar lateral flexion and R rotation ROM with no increase in  pain (07/13/16)   Status On-going     PT LONG TERM GOAL #4   Title Patient to report ability to stand/walk for > 30 minutes with no increase in pain (07/13/16)   Status On-going     PT LONG TERM GOAL #5   Title Patient to demonstrate proper postural alignment with good core engagement (07/13/16)   Status On-going             Patient will benefit from skilled therapeutic intervention in order to improve the following deficits and impairments:  Pain, Decreased mobility, Decreased endurance, Decreased strength, Decreased activity tolerance  Visit Diagnosis: Radiculopathy, lumbar region  Left-sided low back pain with left-sided sciatica, unspecified chronicity  Abnormal posture  Muscle weakness (generalized)     Problem List Patient Active Problem List   Diagnosis Date Noted  . Hyperglycemia 12/25/2012  . Acute on chronic renal failure ([place]) 12/25/2012  . Pulmonary infiltrates 10/02/2012  . Postnasal drip 10/02/2012  . Hyperkalemia 09/25/2012  . Chronic kidney disease, stage III (moderate) 09/25/2012  .  Morbid obesity ([place]) 09/24/2012  . HTN (hypertension) 09/24/2012  . Bipolar II disorder ([place]) 09/24/2012  . URI (upper respiratory infection) 09/24/2012  . DKA, type 1 ([place]) 11/04/2011  . Gastroenteritis 11/03/2011  . DM (diabetes mellitus), type 1, uncontrolled ([place]) 11/03/2011  . Hyponatremia 11/03/2011  . CKD (chronic kidney disease), stage II 11/03/2011  . Elevated lipase 11/03/2011  . Hypothyroidism 11/03/2011  . Tobacco abuse 11/03/2011  . SUI (stress urinary incontinence, female) 08/16/2011    Cathy Ropp, [place] MPT 07/05/2016, 3:33 PM  [facility] [address] [place], [place], [address] Phone: [phone]   Fax:  [phone]  Name: Jasmine [name] MRN: [id number] Date of Birth: [birth year]

## 2016-07-06 ENCOUNTER — Telehealth (HOSPITAL_COMMUNITY): Payer: Self-pay | Admitting: *Deleted

## 2016-07-06 NOTE — Telephone Encounter (Signed)
Discussed with patient. Patient is advised to take geodon 20 mg daily. (disconitnue night time dose).

## 2016-07-06 NOTE — Telephone Encounter (Signed)
voice message from patient, she said the Geodon is making her sleep worse.   She wants to make sure that there are no interactions.

## 2016-07-06 NOTE — Telephone Encounter (Signed)
noted 

## 2016-07-07 ENCOUNTER — Encounter: Payer: Self-pay | Admitting: Physical Therapy

## 2016-07-08 ENCOUNTER — Encounter: Payer: Self-pay | Admitting: Physical Therapy

## 2016-07-09 ENCOUNTER — Telehealth (HOSPITAL_COMMUNITY): Payer: Self-pay | Admitting: *Deleted

## 2016-07-09 NOTE — Telephone Encounter (Signed)
phone call from patient. she saw her doctor yesterday and he told her to increase her Cymbalta from 60 a day to 60 twice a day for her fibromyalga.

## 2016-07-09 NOTE — Telephone Encounter (Signed)
noted 

## 2016-07-13 ENCOUNTER — Telehealth (HOSPITAL_COMMUNITY): Payer: Self-pay | Admitting: *Deleted

## 2016-07-13 NOTE — Telephone Encounter (Signed)
I prefer to wait until the next evaluation before medication change. If Dr. Marijean Bravo would like to increase duloxetine for her pain now, would appreciate his prescription.

## 2016-07-13 NOTE — Telephone Encounter (Signed)
Called pt and informed her with what provider stated. Pt verbalized understanding and stated she will wait until her appt with Dr. Modesta Messing.

## 2016-07-13 NOTE — Telephone Encounter (Signed)
phone call from patient, Dr. Marijean Bravo wanted her to go to 60 mg. of her Cymbalta twice a day.   She said her doctor wants this to go through Dr. Modesta Messing.   Patient said she would need a new prescription to last throught the month.

## 2016-07-14 ENCOUNTER — Ambulatory Visit: Payer: BLUE CROSS/BLUE SHIELD | Attending: Physician Assistant | Admitting: Physical Therapy

## 2016-07-14 ENCOUNTER — Encounter: Payer: Self-pay | Admitting: Physical Therapy

## 2016-07-14 DIAGNOSIS — M5442 Lumbago with sciatica, left side: Secondary | ICD-10-CM | POA: Insufficient documentation

## 2016-07-14 DIAGNOSIS — M5416 Radiculopathy, lumbar region: Secondary | ICD-10-CM | POA: Insufficient documentation

## 2016-07-14 DIAGNOSIS — R293 Abnormal posture: Secondary | ICD-10-CM | POA: Diagnosis present

## 2016-07-14 DIAGNOSIS — M6281 Muscle weakness (generalized): Secondary | ICD-10-CM | POA: Diagnosis present

## 2016-07-14 NOTE — Therapy (Signed)
[facility] [place], [place], [address] Phone: [phone]   Fax:  [phone]  Physical Therapy Treatment  Patient Details  Name: Jasmine [name] MRN: [id number] Date of Birth: [birth year] Referring Provider: Marella Chimes, PA  Encounter Date: 07/14/2016      PT End of Session - 07/14/16 1503    Visit Number 9   Number of Visits 12   Date for PT Re-Evaluation 07/13/16   PT Start Time 7412   PT Stop Time 1513   PT Time Calculation (min) 40 min   Activity Tolerance Patient tolerated treatment well   Behavior During Therapy [facility] for tasks assessed/performed      Past Medical History:  Diagnosis Date  . Anxiety   . Bipolar disorder ([place])   . CKD (chronic kidney disease), stage II   . Depression   . Diabetes mellitus   . DKA, type 1 ([place]) 11/04/2011  . Elevated cholesterol   . GERD (gastroesophageal reflux disease)   . Hypertension   . Hypothyroidism   . IBS (irritable bowel syndrome)   . Obesity   . Sleep apnea    HAS C -PAP / DOES NOT USE  . Stress incontinence   . Tobacco abuse   . UTI (lower urinary tract infection)     Past Surgical History:  Procedure Laterality Date  . INCONTINENCE SURGERY    . NASAL FRACTURE SURGERY    . ovary removed    . PUBOVAGINAL SLING  08/16/2011   Procedure: [name];  Surgeon: Bernestine Amass, MD;  Location: [facility];  Service: Urology;  Laterality: N/A;         . UTERINE FIBROID SURGERY  2001    There were no vitals filed for this visit.      Subjective Assessment - 07/14/16 1446    Subjective Patient reported she tolerated treatment well last visit yet yesterday went shopping then did house work which flared up pain.   Pertinent History DDD of cervical and lumbar region, CKD stage III, type I DM, bipolar disorder, IBS   Limitations Sitting;Standing;Walking   How long can you sit comfortably? 5 minutes   How long can you stand comfortably? 20 minutes   How  long can you walk comfortably? 20 minutes   Diagnostic tests none   Patient Stated Goals less pain   Currently in Pain? Yes   Pain Score 7    Pain Location Back   Pain Orientation Left  radiating to middle and right   Pain Descriptors / Indicators Aching;Sore   Pain Type Chronic pain   Pain Onset More than a month ago   Pain Frequency Intermittent   Aggravating Factors  prolong walking   Pain Relieving Factors rest                         [facility] Adult PT Treatment/Exercise - 07/14/16 0001      Moist Heat Therapy   Number Minutes Moist Heat 15 Minutes   Moist Heat Location Lumbar Spine     Electrical Stimulation   Electrical Stimulation Location low back   Electrical Stimulation Action premod   Electrical Stimulation Parameters 80-150hz  x6min     Ultrasound   Ultrasound Location left low back   Ultrasound Parameters 1.5w/cm2/50%/39mhz x93min   Ultrasound Goals Pain     Manual Therapy   Manual Therapy Soft tissue mobilization   Manual therapy comments Right sdly position with folded pillow between  knees:  STW/M to left low back musculature including a QL                      PT Long Term Goals - 06/30/16 1351      PT LONG TERM GOAL #1   Title patient to be independent with HEP (07/13/16)   Status Achieved  Patient doing HEP daily with good understanding 06/30/16     PT LONG TERM GOAL #2   Title Patient to improve L hip strength to >/= 4+/5 (07/13/16)   Status On-going     PT LONG TERM GOAL #3   Title Patient to improve L lumbar lateral flexion and R rotation ROM with no increase in pain (07/13/16)   Status On-going     PT LONG TERM GOAL #4   Title Patient to report ability to stand/walk for > 30 minutes with no increase in pain (07/13/16)   Status On-going     PT LONG TERM GOAL #5   Title Patient to demonstrate proper postural alignment with good core engagement (07/13/16)   Status On-going               Plan - 07/14/16 1505     Clinical Impression Statement Patient tolerated treatment well today and unable to perform any exercises. Patient felt better after treatment today. Patient reported doing too much walking then house work which increased pain in back. Patient has flare ups with prolong activity. Patient unable to meet any further goals due to pain deficts.    Rehab Potential Good   Clinical Impairments Affecting Rehab Potential DDD of cervical and lumbar region, CKD stage III, type I DM, bipolar disorder, IBS   PT Frequency 2x / week   PT Duration 6 weeks   PT Treatment/Interventions ADLs/Self Care Home Management;Cryotherapy;Electrical Stimulation;Moist Heat;Ultrasound;Neuromuscular re-education;Balance training;Therapeutic exercise;Therapeutic activities;Functional mobility training;Stair training;Gait training;Patient/family education;Manual techniques;Passive range of motion;Vasopneumatic Device;Taping;Dry needling   PT Next Visit Plan cont with POC for L-sided hip strengthening and stretching; lumbopelvic/core stability training; HEP review prn, modalities prn for pain relief   Consulted and Agree with Plan of Care Patient      Patient will benefit from skilled therapeutic intervention in order to improve the following deficits and impairments:  Pain, Decreased mobility, Decreased endurance, Decreased strength, Decreased activity tolerance  Visit Diagnosis: Radiculopathy, lumbar region  Left-sided low back pain with left-sided sciatica, unspecified chronicity  Abnormal posture  Muscle weakness (generalized)     Problem List Patient Active Problem List   Diagnosis Date Noted  . Hyperglycemia 12/25/2012  . Acute on chronic renal failure ([place]) 12/25/2012  . Pulmonary infiltrates 10/02/2012  . Postnasal drip 10/02/2012  . Hyperkalemia 09/25/2012  . Chronic kidney disease, stage III (moderate) 09/25/2012  . Morbid obesity ([place]) 09/24/2012  . HTN (hypertension) 09/24/2012  . Bipolar II disorder  ([place]) 09/24/2012  . URI (upper respiratory infection) 09/24/2012  . DKA, type 1 ([place]) 11/04/2011  . Gastroenteritis 11/03/2011  . DM (diabetes mellitus), type 1, uncontrolled ([place]) 11/03/2011  . Hyponatremia 11/03/2011  . CKD (chronic kidney disease), stage II 11/03/2011  . Elevated lipase 11/03/2011  . Hypothyroidism 11/03/2011  . Tobacco abuse 11/03/2011  . SUI (stress urinary incontinence, female) 08/16/2011    Phillips Climes, PTA 07/14/2016, 3:13 PM  [facility] [place], [place], [address] Phone: [phone]   Fax:  [phone]  Name: GINNIFER CREELMAN MRN: [id number] Date of Birth: 28-Oct-1970

## 2016-07-19 ENCOUNTER — Encounter: Payer: Self-pay | Admitting: Physical Therapy

## 2016-07-21 ENCOUNTER — Ambulatory Visit: Payer: BLUE CROSS/BLUE SHIELD | Admitting: Physical Therapy

## 2016-07-21 DIAGNOSIS — M5442 Lumbago with sciatica, left side: Secondary | ICD-10-CM

## 2016-07-21 DIAGNOSIS — M5416 Radiculopathy, lumbar region: Secondary | ICD-10-CM

## 2016-07-21 DIAGNOSIS — M6281 Muscle weakness (generalized): Secondary | ICD-10-CM

## 2016-07-21 DIAGNOSIS — R293 Abnormal posture: Secondary | ICD-10-CM

## 2016-07-21 NOTE — Therapy (Signed)
[facility] [place], [place], [address] Phone: [phone]   Fax:  [phone]  Physical Therapy Treatment  Patient Details  Name: Jasmine [name] MRN: [id number] Date of Birth: 12-11-1970 Referring Provider: Marella Chimes, PA  Encounter Date: 07/21/2016      PT End of Session - 07/21/16 1447    Visit Number 10   Number of Visits 12   Date for PT Re-Evaluation 07/13/16   PT Start Time 4401   PT Stop Time 1545   PT Time Calculation (min) 61 min   Activity Tolerance Patient tolerated treatment well   Behavior During Therapy [facility] for tasks assessed/performed      Past Medical History:  Diagnosis Date  . Anxiety   . Bipolar disorder ([place])   . CKD (chronic kidney disease), stage II   . Depression   . Diabetes mellitus   . DKA, type 1 ([place]) 11/04/2011  . Elevated cholesterol   . GERD (gastroesophageal reflux disease)   . Hypertension   . Hypothyroidism   . IBS (irritable bowel syndrome)   . Obesity   . Sleep apnea    HAS C -PAP / DOES NOT USE  . Stress incontinence   . Tobacco abuse   . UTI (lower urinary tract infection)     Past Surgical History:  Procedure Laterality Date  . INCONTINENCE SURGERY    . NASAL FRACTURE SURGERY    . ovary removed    . PUBOVAGINAL SLING  08/16/2011   Procedure: [name];  Surgeon: Bernestine Amass, MD;  Location: [facility];  Service: Urology;  Laterality: N/A;         . UTERINE FIBROID SURGERY  2001    There were no vitals filed for this visit.      Subjective Assessment - 07/21/16 1451    Subjective Pt. reporting her pain is lower than last treatment today however that she has been, "trying to do more" the last few days and pain was worse this morning.     Patient Stated Goals less pain   Currently in Pain? Yes   Pain Score 2    Pain Location Back   Pain Orientation Left   Pain Descriptors / Indicators Aching;Sore   Pain Type Chronic pain   Pain Onset More  than a month ago   Pain Frequency Intermittent   Aggravating Factors  prolonged walking               [facility] Adult PT Treatment/Exercise - 07/21/16 1503      Lumbar Exercises: Stretches   Passive Hamstring Stretch 2 reps;30 seconds  L side    Passive Hamstring Stretch Limitations with therapist    ITB Stretch 2 reps;30 seconds  L side    ITB Stretch Limitations with therapist    Piriformis Stretch 2 reps;30 seconds  L sided   Piriformis Stretch Limitations with therapist      Lumbar Exercises: Supine   Clam 15 reps;3 seconds   Clam Limitations with green TB around knees; tactile cueing for abdominal bracing   Bent Knee Raise 15 reps  x 2 sets; 2nd set with green TB around knees    Bent Knee Raise Limitations with green TB    Bridge 15 reps;5 seconds   Bridge Limitations limited ROM; green TB at knees      Lumbar Exercises: Sidelying   Clam 15 reps;5 seconds  R sidelying L hip    Clam Limitations with green TB  Knee/Hip Exercises: Standing   Hip Abduction Stengthening;Knee straight;10 reps;Right;Left   Hip Extension Stengthening;Knee straight;Right;Left  x 8 reps each side    Extension Limitations activity terminated due to rise in LBP      Moist Heat Therapy   Number Minutes Moist Heat 15 Minutes   Moist Heat Location Lumbar Spine  Hooklying      Electrical Stimulation   Electrical Stimulation Location low back  R sided Low back    Electrical Stimulation Action premod    Electrical Stimulation Parameters 80-150Hz  x 15 min    Electrical Stimulation Goals Pain              PT Long Term Goals - 06/30/16 1351      PT LONG TERM GOAL #1   Title patient to be independent with HEP (07/13/16)   Status Achieved  Patient doing HEP daily with good understanding 06/30/16     PT LONG TERM GOAL #2   Title Patient to improve L hip strength to >/= 4+/5 (07/13/16)   Status On-going     PT LONG TERM GOAL #3   Title Patient to improve L lumbar lateral flexion  and R rotation ROM with no increase in pain (07/13/16)   Status On-going     PT LONG TERM GOAL #4   Title Patient to report ability to stand/walk for > 30 minutes with no increase in pain (07/13/16)   Status On-going     PT LONG TERM GOAL #5   Title Patient to demonstrate proper postural alignment with good core engagement (07/13/16)   Status On-going               Plan - 07/21/16 1453    Clinical Impression Statement Pt. reporting improved pain levels since last treatment today thus lumbopelvic strengthening continued today.  Pt. tolerating therex well today with exception of rise in LBP with standing hip extension.  Pt. reporting good benefit from E-stim/heat combo performed last treatment, which provided relief for entire day.  Treatment concluding with moist heat/E-stim combo to lumbar spine with pt. leaving therapy with report of 1/10 pain.   PT Treatment/Interventions ADLs/Self Care Home Management;Cryotherapy;Electrical Stimulation;Moist Heat;Ultrasound;Neuromuscular re-education;Balance training;Therapeutic exercise;Therapeutic activities;Functional mobility training;Stair training;Gait training;Patient/family education;Manual techniques;Passive range of motion;Vasopneumatic Device;Taping;Dry needling   PT Next Visit Plan Address goals; monitor response to core strengthening focus from 2.14; cont with POC for L-sided hip strengthening and stretching; lumbopelvic/core stability training; HEP review prn, modalities prn for pain relief      Patient will benefit from skilled therapeutic intervention in order to improve the following deficits and impairments:  Pain, Decreased mobility, Decreased endurance, Decreased strength, Decreased activity tolerance  Visit Diagnosis: Radiculopathy, lumbar region  Left-sided low back pain with left-sided sciatica, unspecified chronicity  Abnormal posture  Muscle weakness (generalized)     Problem List Patient Active Problem List    Diagnosis Date Noted  . Hyperglycemia 12/25/2012  . Acute on chronic renal failure ([place]) 12/25/2012  . Pulmonary infiltrates 10/02/2012  . Postnasal drip 10/02/2012  . Hyperkalemia 09/25/2012  . Chronic kidney disease, stage III (moderate) 09/25/2012  . Morbid obesity ([place]) 09/24/2012  . HTN (hypertension) 09/24/2012  . Bipolar II disorder ([place]) 09/24/2012  . URI (upper respiratory infection) 09/24/2012  . DKA, type 1 ([place]) 11/04/2011  . Gastroenteritis 11/03/2011  . DM (diabetes mellitus), type 1, uncontrolled ([place]) 11/03/2011  . Hyponatremia 11/03/2011  . CKD (chronic kidney disease), stage II 11/03/2011  . Elevated lipase 11/03/2011  . Hypothyroidism 11/03/2011  .  Tobacco abuse 11/03/2011  . SUI (stress urinary incontinence, female) 08/16/2011    Bess Harvest, PTA 07/21/16 3:57 PM  [facility] [place], [place], [address] Phone: [phone]   Fax:  [phone]  Name: NASHAE MAUDLIN MRN: [id number] Date of Birth: 07/02/1970

## 2016-07-27 ENCOUNTER — Ambulatory Visit (INDEPENDENT_AMBULATORY_CARE_PROVIDER_SITE_OTHER): Payer: BLUE CROSS/BLUE SHIELD | Admitting: Adult Health

## 2016-07-27 ENCOUNTER — Encounter: Payer: Self-pay | Admitting: Adult Health

## 2016-07-27 VITALS — BP 122/80 | HR 58 | Ht 69.0 in | Wt 296.4 lb

## 2016-07-27 DIAGNOSIS — R062 Wheezing: Secondary | ICD-10-CM | POA: Diagnosis not present

## 2016-07-27 DIAGNOSIS — G4733 Obstructive sleep apnea (adult) (pediatric): Secondary | ICD-10-CM

## 2016-07-27 DIAGNOSIS — R400 Somnolence: Secondary | ICD-10-CM

## 2016-07-27 DIAGNOSIS — R918 Other nonspecific abnormal finding of lung field: Secondary | ICD-10-CM

## 2016-07-27 NOTE — Progress Notes (Signed)
@Patient  ID: Jasmine [name], female    DOB: 12-Jan-1971, 46 y.o.   MRN: [id number]  Chief Complaint  Patient presents with  . Follow-up    OSA and Asthma     Referring provider: Aletha Halim., PA-C  HPI: 46 yo female former smoker with PMH of DM (Type I ) and Bipolar dz referred to our office for asth   TEST  Pulmonary function testing dated 05/01/2012 FEV1 2.8 L/82%. FVC 2.3 L/79%. Total lung capacity 4.75 L 82%. DLCO of 18.7 per/55%   07/27/2016 Follow Up : Referred to our office for asthma and OSA  Pt presents to our office after referral from PCP . She has recently had wheezing and shortness of breath . She says she was dx with asthma and started on proair.   She was having wheezing , dry cough and post nasal drip , throat clearing for last 3 months. . Was started on ProAir . She uses it 1-2 x day. W/ only minimal improvement .  She is former smoker , 1 PPD x 20 yr.   Last seen in our office 2015. Was seen in past for pulmonary infiltrates. Serial CT chest showed improvement with last CT chest in 2015 mild scarring in right lung.   Says she carries a dx of moderate OSA ~7 yr ago. Did not wear CPAP and had to return it due to insurance coverage. She wants to be checked again because she is having trouble sleeping, has snoring and significant daytime sleepiness.    Allergies  Allergen Reactions  . Ciprofloxacin Swelling    Per pt caused lips swell and nauseous feeling  . Buspar [Buspirone] Other (See Comments)    abd cramping  . Advair Diskus [Fluticasone-Salmeterol] Other (See Comments)    Thrush   . Levaquin [Levofloxacin]     Per pt caused lips swell and nauseous feeling  . Ondansetron Other (See Comments)    hiccups  . Biaxin [Clarithromycin] Rash  . Hydroxyzine Palpitations    other    Immunization History  Administered Date(s) Administered  . Influenza Split 04/07/2012, 04/07/2013  . Pneumococcal Polysaccharide-23 04/08/2011    Past Medical  History:  Diagnosis Date  . Anxiety   . Bipolar disorder ([place])   . CKD (chronic kidney disease), stage II   . Depression   . Diabetes mellitus   . DKA, type 1 ([place]) 11/04/2011  . Elevated cholesterol   . GERD (gastroesophageal reflux disease)   . Hypertension   . Hypothyroidism   . IBS (irritable bowel syndrome)   . Obesity   . Sleep apnea    HAS C -PAP / DOES NOT USE  . Stress incontinence   . Tobacco abuse   . UTI (lower urinary tract infection)     Tobacco History: History  Smoking Status  . Former Smoker  . Packs/day: 0.50  . Years: 20.00  . Types: Cigarettes  Smokeless Tobacco  . Never Used   Counseling given: Not Answered   Outpatient Encounter Prescriptions as of 07/27/2016  Medication Sig  . acetaminophen (TYLENOL) 500 MG tablet Take 1,000 mg by mouth every 6 (six) hours as needed. For pain  . aspirin EC 81 MG tablet Take 81 mg by mouth daily.  . carvedilol (COREG) 25 MG tablet Take 37.5 mg by mouth 2 (two) times daily with a meal.   . cholecalciferol (VITAMIN D) 1000 UNITS tablet Take 1,000 Units by mouth daily.  . Diclofenac Sodium 2 % SOLN Place onto  the skin as needed.  . dicyclomine (BENTYL) 10 MG capsule Take 1 capsule (10 mg total) by mouth 3 (three) times daily as needed (abdominal pain).  . DULoxetine (CYMBALTA) 60 MG capsule Take 1 capsule (60 mg total) by mouth daily.  . fish oil-omega-3 fatty acids 1000 MG capsule Take 1 g by mouth daily.  . fluticasone (FLONASE) 50 MCG/ACT nasal spray Place 2 sprays into the nose daily.   . folic acid (FOLVITE) 425 MCG tablet Take 800 mcg by mouth daily.  . furosemide (LASIX) 40 MG tablet Take 40 mg by mouth daily.   . insulin aspart (NOVOLOG) 100 UNIT/ML injection Inject into the skin continuous. Daily Bolus of 2.9 units every hour  . lamoTRIgine (LAMICTAL) 100 MG tablet Take 1 tablet (100 mg total) by mouth daily.  Marland Kitchen levothyroxine (SYNTHROID, LEVOTHROID) 150 MCG tablet Take 150 mcg by mouth daily before breakfast.   . lubiprostone (AMITIZA) 8 MCG capsule Take 8 mcg by mouth 2 (two) times daily.  . medroxyPROGESTERone (PROVERA) 10 MG tablet Take 20 mg by mouth as directed. Only takes the first 5 days of every third month  . omeprazole (PRILOSEC) 20 MG capsule Take 20 mg by mouth at bedtime.  . pravastatin (PRAVACHOL) 80 MG tablet Take 80 mg by mouth at bedtime.  . promethazine (PHENERGAN) 25 MG tablet Take 1 tablet (25 mg total) by mouth every 6 (six) hours as needed for nausea or vomiting.  . Psyllium (METAMUCIL FIBER PO) Take by mouth daily.  Marland Kitchen SALINE NASAL MIST NA Place into the nose every morning.  . Simethicone (PHAZYME MAXIMUM STRENGTH) 250 MG CAPS Take 250 mg by mouth as needed.  Marland Kitchen tiZANidine (ZANAFLEX) 4 MG capsule Take 4 mg by mouth 3 (three) times daily.  . traMADol (ULTRAM) 50 MG tablet Take 50 mg by mouth 2 (two) times daily.  . ziprasidone (GEODON) 20 MG capsule Take 1 capsule (20 mg total) by mouth 2 (two) times daily with a meal.   No facility-administered encounter medications on file as of 07/27/2016.      Review of Systems  Constitutional:   No  weight loss, night sweats,  Fevers, chills, fatigue, or  lassitude.  HEENT:   No headaches,  Difficulty swallowing,  Tooth/dental problems, or  Sore throat,                No sneezing, itching, ear ache,  +nasal congestion, post nasal drip,   CV:  No chest pain,  Orthopnea, PND, swelling in lower extremities, anasarca, dizziness, palpitations, syncope.   GI  No heartburn, indigestion, abdominal pain, nausea, vomiting, diarrhea, change in bowel habits, loss of appetite, bloody stools.   Resp:    No chest wall deformity  Skin: no rash or lesions.  GU: no dysuria, change in color of urine, no urgency or frequency.  No flank pain, no hematuria   MS:  No joint pain or swelling.  No decreased range of motion.  No back pain.    Physical Exam  BP 122/80   Pulse (!) 58   Ht 5\' 9"  (1.753 m)   Wt 296 lb 6.4 oz (134.4 kg)   SpO2 98%    BMI 43.77 kg/m   GEN: A/Ox3; pleasant , NAD,obese    HEENT:  [place]/AT,  EACs-clear, TMs-wnl, NOSE-clear, THROAT-clear, no lesions, no postnasal drip or exudate noted. Poor dentiton , class 2 MP airway   NECK:  Supple w/ fair ROM; no JVD; normal carotid impulses w/o bruits; no thyromegaly  or nodules palpated; no lymphadenopathy.    RESP  Clear  P & A; w/o, wheezes/ rales/ or rhonchi. no accessory muscle use, no dullness to percussion  CARD:  RRR, no m/r/g, no peripheral edema, pulses intact, no cyanosis or clubbing.  GI:   Soft & nt; nml bowel sounds; no organomegaly or masses detected.   Musco: Warm bil, no deformities or joint swelling noted.   Neuro: alert, no focal deficits noted.    Skin: Warm, no lesions or rashes    Lab Results:  CBC  BNP No results found for: BNP  ProBNP No results found for: PROBNP  Imaging: No results found.   Assessment & Plan:   OSA (obstructive sleep apnea) Previous hx of moderate OSA w/ CPAP noncompliance  Pt has sx of OSA , set up home sleep study   Wheezing Possible Asthma /RAD ,vs COPD -set up for PFT  Control for triggers  Plan  Patient Instructions  Begin Allegra daily for drainage  Continue on Flonase daily  Continue on Saline nasal rinses.  Delsym 2 tsp Twice daily  As needed  Cough .  GERD diet  Continue on Prilosec 20mg  daily .  Set up for PFT in 4-6 weeks with Dr. Chase Caller . And As needed   Please contact office for sooner follow up if symptoms do not improve or worsen or seek emergency care   Set up for home sleep study .        Rexene Edison, NP 07/27/2016

## 2016-07-27 NOTE — Assessment & Plan Note (Signed)
Possible Asthma /RAD ,vs COPD -set up for PFT  Control for triggers  Plan  Patient Instructions  Begin Allegra daily for drainage  Continue on Flonase daily  Continue on Saline nasal rinses.  Delsym 2 tsp Twice daily  As needed  Cough .  GERD diet  Continue on Prilosec 20mg  daily .  Set up for PFT in 4-6 weeks with Dr. Chase Caller . And As needed   Please contact office for sooner follow up if symptoms do not improve or worsen or seek emergency care   Set up for home sleep study .

## 2016-07-27 NOTE — Patient Instructions (Addendum)
Begin Allegra daily for drainage  Continue on Flonase daily  Continue on Saline nasal rinses.  Delsym 2 tsp Twice daily  As needed  Cough .  GERD diet  Continue on Prilosec 20mg  daily .  Set up for PFT in 4-6 weeks with Dr. Chase Caller . And As needed   Please contact office for sooner follow up if symptoms do not improve or worsen or seek emergency care   Set up for home sleep study .

## 2016-07-27 NOTE — Assessment & Plan Note (Signed)
Previous hx of moderate OSA w/ CPAP noncompliance  Pt has sx of OSA , set up home sleep study

## 2016-07-28 ENCOUNTER — Ambulatory Visit: Payer: BLUE CROSS/BLUE SHIELD | Admitting: Physical Therapy

## 2016-07-28 ENCOUNTER — Encounter: Payer: Self-pay | Admitting: Physical Therapy

## 2016-07-28 DIAGNOSIS — M5442 Lumbago with sciatica, left side: Secondary | ICD-10-CM

## 2016-07-28 DIAGNOSIS — R293 Abnormal posture: Secondary | ICD-10-CM

## 2016-07-28 DIAGNOSIS — M5416 Radiculopathy, lumbar region: Secondary | ICD-10-CM | POA: Diagnosis not present

## 2016-07-28 DIAGNOSIS — M6281 Muscle weakness (generalized): Secondary | ICD-10-CM

## 2016-07-28 NOTE — Therapy (Signed)
[facility] [place], [place], [address] Phone: [phone]   Fax:  [phone]  Physical Therapy Treatment  Patient Details  Name: Jasmine [name] MRN: [id number] Date of Birth: [birth year] Referring Provider: Marella Chimes, PA  Encounter Date: 07/28/2016      PT End of Session - 07/28/16 1453    Visit Number 11   Number of Visits 12   Date for PT Re-Evaluation 07/13/16   PT Start Time 1446   PT Stop Time 1527   PT Time Calculation (min) 41 min   Activity Tolerance Patient tolerated treatment well   Behavior During Therapy [facility] for tasks assessed/performed      Past Medical History:  Diagnosis Date  . Anxiety   . Bipolar disorder ([place])   . CKD (chronic kidney disease), stage II   . Depression   . Diabetes mellitus   . DKA, type 1 ([place]) 11/04/2011  . Elevated cholesterol   . GERD (gastroesophageal reflux disease)   . Hypertension   . Hypothyroidism   . IBS (irritable bowel syndrome)   . Obesity   . Sleep apnea    HAS C -PAP / DOES NOT USE  . Stress incontinence   . Tobacco abuse   . UTI (lower urinary tract infection)     Past Surgical History:  Procedure Laterality Date  . INCONTINENCE SURGERY    . NASAL FRACTURE SURGERY    . ovary removed    . PUBOVAGINAL SLING  08/16/2011   Procedure: [name];  Surgeon: Bernestine Amass, MD;  Location: [facility];  Service: Urology;  Laterality: N/A;         . UTERINE FIBROID SURGERY  2001    There were no vitals filed for this visit.      Subjective Assessment - 07/28/16 1448    Subjective Patient reported increased pain after last visit   Pertinent History DDD of cervical and lumbar region, CKD stage III, type I DM, bipolar disorder, IBS   Limitations Sitting;Standing;Walking   How long can you sit comfortably? 5 minutes   How long can you stand comfortably? 20 minutes   How long can you walk comfortably? 20 minutes   Diagnostic tests none   Patient  Stated Goals less pain   Currently in Pain? Yes   Pain Score 5    Pain Location Back   Pain Orientation Left   Pain Descriptors / Indicators Aching;Sore   Pain Type Chronic pain   Pain Onset More than a month ago   Pain Frequency Intermittent   Aggravating Factors  prolong activity   Pain Relieving Factors at rest                         [facility] Adult PT Treatment/Exercise - 07/28/16 0001      Moist Heat Therapy   Number Minutes Moist Heat 15 Minutes   Moist Heat Location Lumbar Spine     Electrical Stimulation   Electrical Stimulation Location low back   Electrical Stimulation Action prmod   Electrical Stimulation Parameters 80-150hz  x29min   Electrical Stimulation Goals Pain     Ultrasound   Ultrasound Location left low back   Ultrasound Parameters 1.5w/cm2/50%/27mhz x40min   Ultrasound Goals Pain     Manual Therapy   Manual Therapy Soft tissue mobilization   Manual therapy comments Right sdly position with folded pillow between knees:  STW/M to left low back musculature including a QL  PT Long Term Goals - 06/30/16 1351      PT LONG TERM GOAL #1   Title patient to be independent with HEP (07/13/16)   Status Achieved  Patient doing HEP daily with good understanding 06/30/16     PT LONG TERM GOAL #2   Title Patient to improve L hip strength to >/= 4+/5 (07/13/16)   Status On-going     PT LONG TERM GOAL #3   Title Patient to improve L lumbar lateral flexion and R rotation ROM with no increase in pain (07/13/16)   Status On-going     PT LONG TERM GOAL #4   Title Patient to report ability to stand/walk for > 30 minutes with no increase in pain (07/13/16)   Status On-going     PT LONG TERM GOAL #5   Title Patient to demonstrate proper postural alignment with good core engagement (07/13/16)   Status On-going               Plan - 07/28/16 1457    Clinical Impression Statement Patient tolerated treatment well today.  Patient has reported some exercise's or increased activity will increase her pain. Patient has reported temporary relief from therapy overall. Patient reports being able to stand an hour at times with tolerable pain. Patient unable to meet any further goals due to pain deficts. Patient requested one more visit then she will go back to her MD. She would like to try dry needling  per PT discression.   Rehab Potential Good   Clinical Impairments Affecting Rehab Potential DDD of cervical and lumbar region, CKD stage III, type I DM, bipolar disorder, IBS   PT Frequency 2x / week   PT Duration 6 weeks   PT Next Visit Plan cont with POC for 1 visit and dry needling per MPT discression   Consulted and Agree with Plan of Care Patient      Patient will benefit from skilled therapeutic intervention in order to improve the following deficits and impairments:  Pain, Decreased mobility, Decreased endurance, Decreased strength, Decreased activity tolerance  Visit Diagnosis: Radiculopathy, lumbar region  Left-sided low back pain with left-sided sciatica, unspecified chronicity  Abnormal posture  Muscle weakness (generalized)     Problem List Patient Active Problem List   Diagnosis Date Noted  . OSA (obstructive sleep apnea) 07/27/2016  . Wheezing 07/27/2016  . Hyperglycemia 12/25/2012  . Acute on chronic renal failure ([place]) 12/25/2012  . Pulmonary infiltrates 10/02/2012  . Postnasal drip 10/02/2012  . Hyperkalemia 09/25/2012  . Chronic kidney disease, stage III (moderate) 09/25/2012  . Morbid obesity ([place]) 09/24/2012  . HTN (hypertension) 09/24/2012  . Bipolar II disorder ([place]) 09/24/2012  . URI (upper respiratory infection) 09/24/2012  . DKA, type 1 ([place]) 11/04/2011  . Gastroenteritis 11/03/2011  . DM (diabetes mellitus), type 1, uncontrolled ([place]) 11/03/2011  . Hyponatremia 11/03/2011  . CKD (chronic kidney disease), stage II 11/03/2011  . Elevated lipase 11/03/2011  . Hypothyroidism  11/03/2011  . Tobacco abuse 11/03/2011  . SUI (stress urinary incontinence, female) 08/16/2011    Phillips Climes, PTA 07/28/2016, 3:27 PM  [facility] [name], [place], [address] Phone: [phone]   Fax:  [phone]  Name: Jasmine [name] MRN: [id number] Date of Birth: 1970/08/07

## 2016-08-02 IMAGING — DX DG CHEST 2V
2 series · 2 of 2 positions shown · non-contrast
Comparison: 10/26/2015

CLINICAL DATA: Chest and abdominal pain with nausea since
yesterday.

EXAM:
CHEST  2 VIEW

[chest pa]
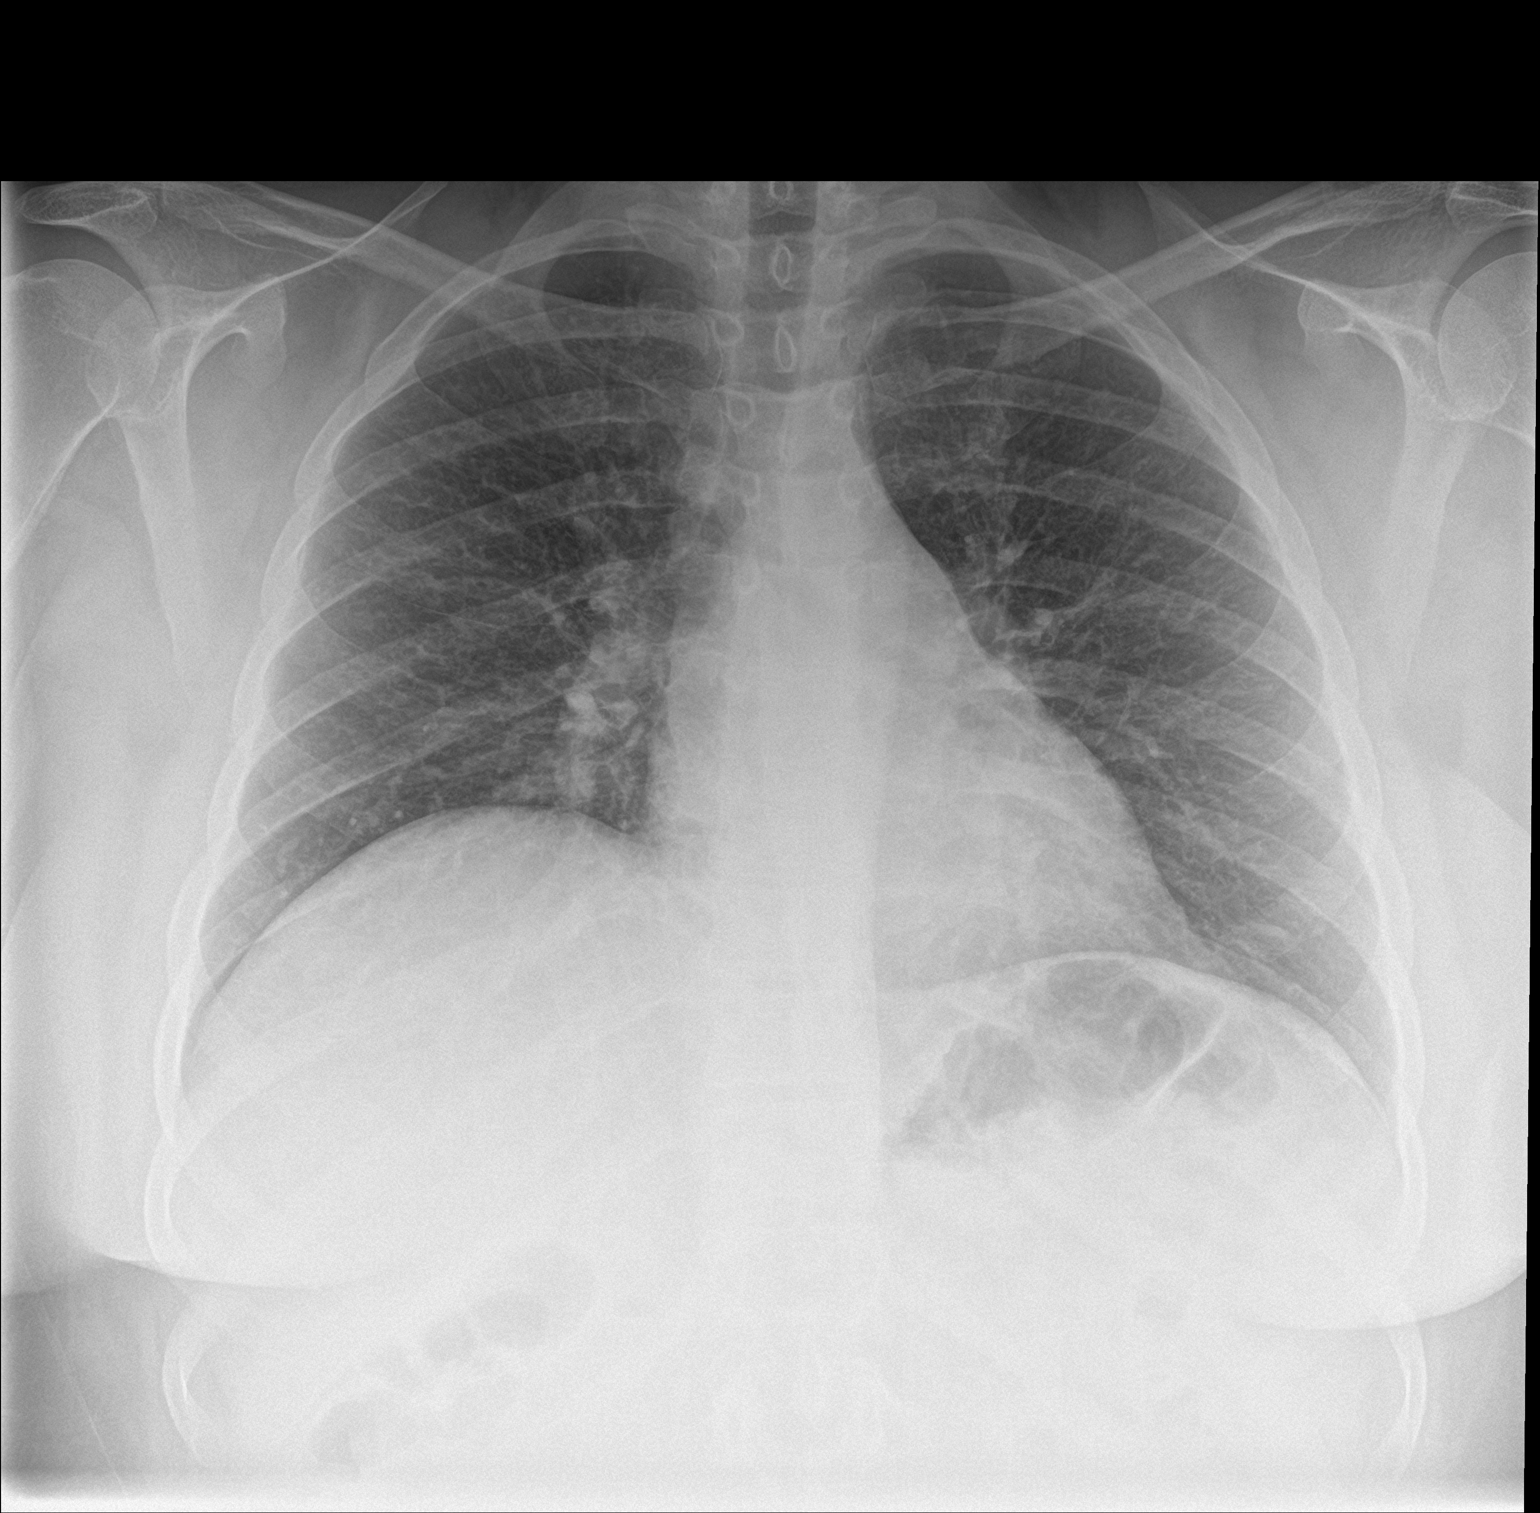

[chest lat]
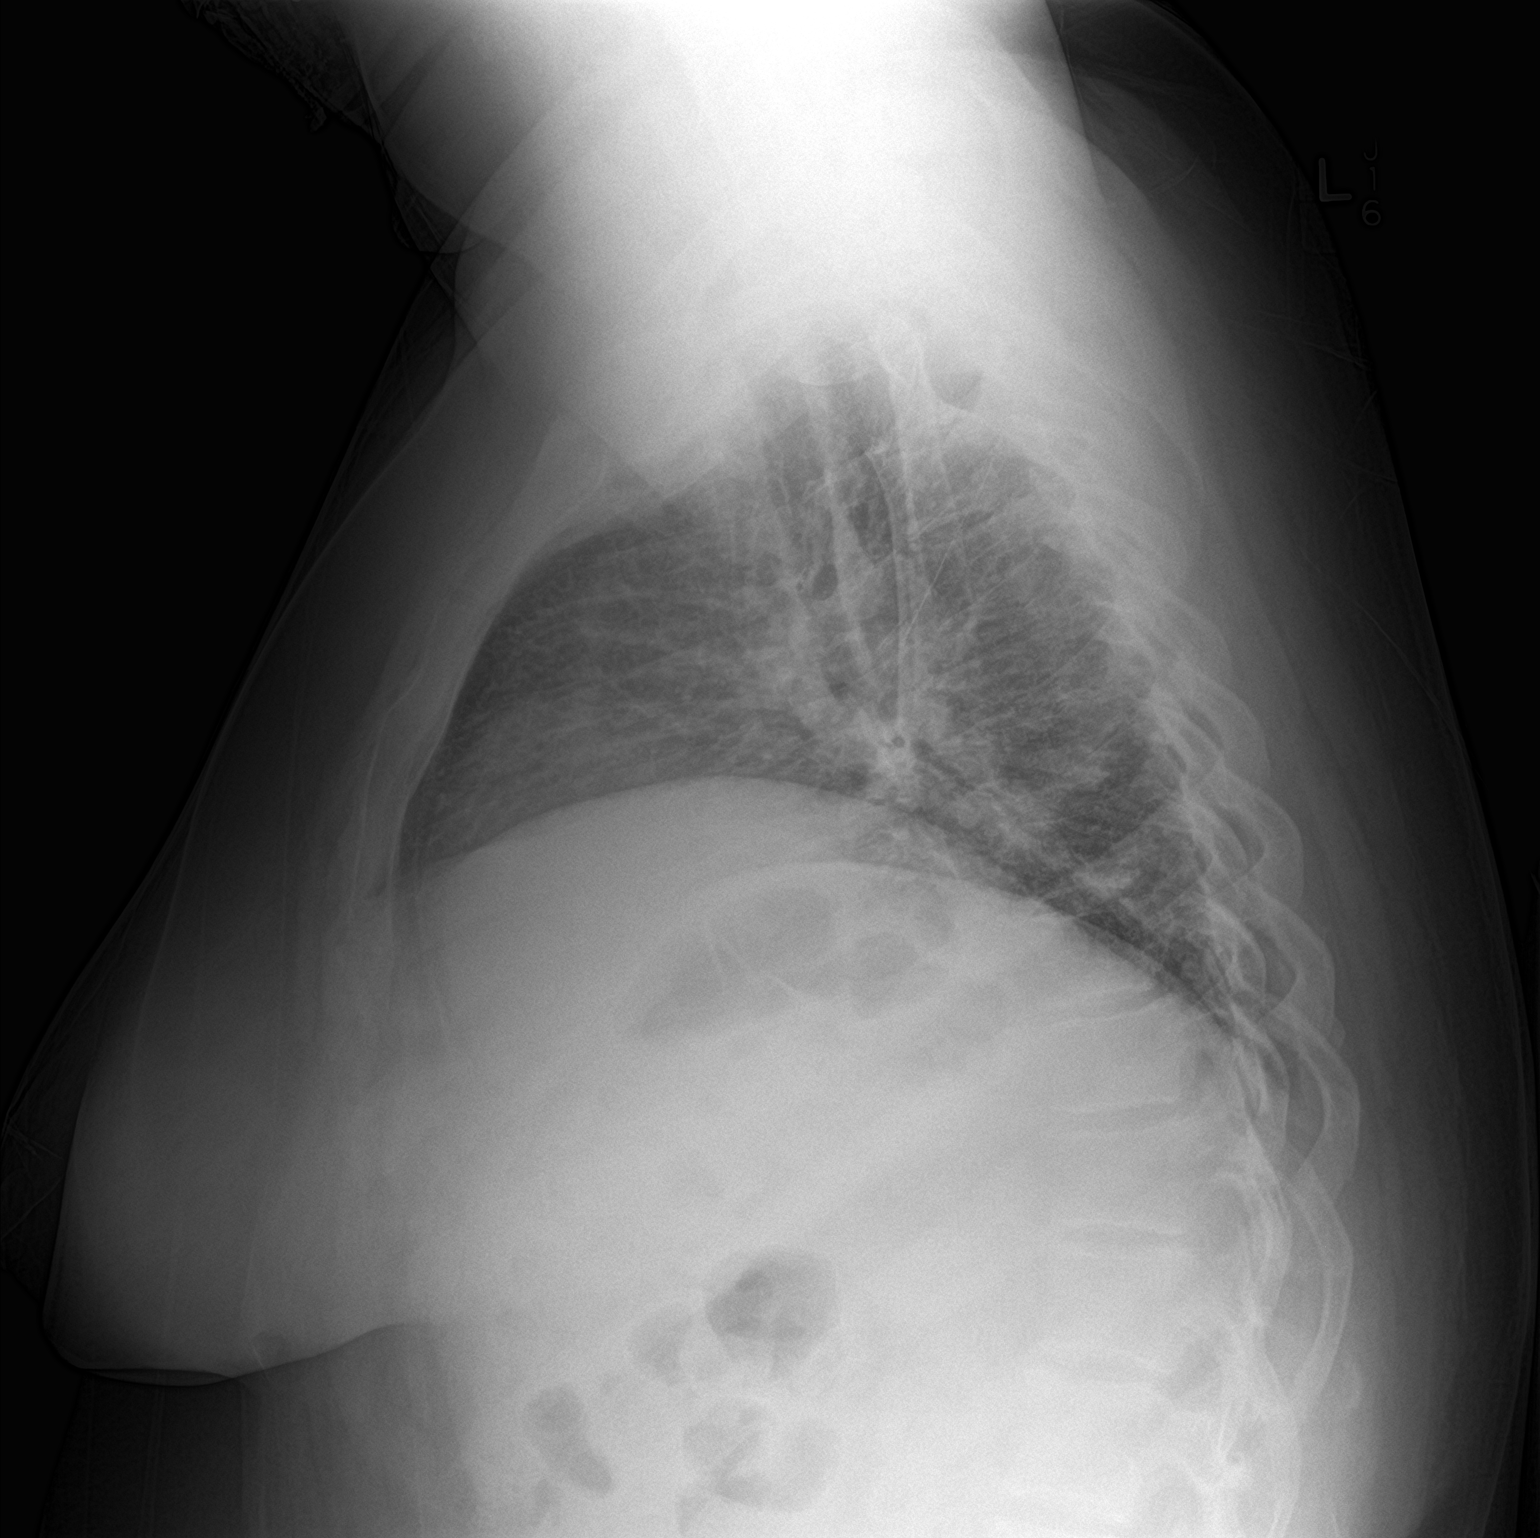

[2 of 2 positions shown; findings below may reference images not displayed]

FINDINGS: The heart size and mediastinal contours are within normal limits.
Both lungs are clear. Low lung volumes noted on lateral projection.
No evidence of pneumothorax or pleural effusion. The visualized
skeletal structures are unremarkable.
IMPRESSION: No active cardiopulmonary disease.
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
## 2016-08-03 ENCOUNTER — Ambulatory Visit: Payer: BLUE CROSS/BLUE SHIELD | Admitting: Physical Therapy

## 2016-08-03 ENCOUNTER — Ambulatory Visit (HOSPITAL_COMMUNITY): Payer: Self-pay | Admitting: Psychiatry

## 2016-08-03 ENCOUNTER — Ambulatory Visit (INDEPENDENT_AMBULATORY_CARE_PROVIDER_SITE_OTHER): Payer: BLUE CROSS/BLUE SHIELD | Admitting: Psychiatry

## 2016-08-03 VITALS — BP 143/76 | HR 73 | Ht 69.0 in | Wt 300.6 lb

## 2016-08-03 DIAGNOSIS — R45851 Suicidal ideations: Secondary | ICD-10-CM

## 2016-08-03 DIAGNOSIS — Z888 Allergy status to other drugs, medicaments and biological substances status: Secondary | ICD-10-CM

## 2016-08-03 DIAGNOSIS — Z9889 Other specified postprocedural states: Secondary | ICD-10-CM

## 2016-08-03 DIAGNOSIS — K219 Gastro-esophageal reflux disease without esophagitis: Secondary | ICD-10-CM | POA: Diagnosis not present

## 2016-08-03 DIAGNOSIS — Z87891 Personal history of nicotine dependence: Secondary | ICD-10-CM

## 2016-08-03 DIAGNOSIS — Z7982 Long term (current) use of aspirin: Secondary | ICD-10-CM

## 2016-08-03 DIAGNOSIS — N189 Chronic kidney disease, unspecified: Secondary | ICD-10-CM | POA: Diagnosis not present

## 2016-08-03 DIAGNOSIS — Z79899 Other long term (current) drug therapy: Secondary | ICD-10-CM

## 2016-08-03 DIAGNOSIS — F39 Unspecified mood [affective] disorder: Secondary | ICD-10-CM | POA: Diagnosis not present

## 2016-08-03 DIAGNOSIS — Z794 Long term (current) use of insulin: Secondary | ICD-10-CM

## 2016-08-03 DIAGNOSIS — F3181 Bipolar II disorder: Secondary | ICD-10-CM | POA: Diagnosis not present

## 2016-08-03 DIAGNOSIS — Z818 Family history of other mental and behavioral disorders: Secondary | ICD-10-CM

## 2016-08-03 DIAGNOSIS — M5416 Radiculopathy, lumbar region: Secondary | ICD-10-CM | POA: Diagnosis not present

## 2016-08-03 DIAGNOSIS — K589 Irritable bowel syndrome without diarrhea: Secondary | ICD-10-CM

## 2016-08-03 MED ORDER — LAMOTRIGINE 150 MG PO TABS: 150.0000 mg | ORAL_TABLET | Freq: Every day | ORAL | 0 refills | Status: DC

## 2016-08-03 MED ORDER — ZIPRASIDONE HCL 20 MG PO CAPS: 20.0000 mg | ORAL_CAPSULE | Freq: Every day | ORAL | 0 refills | Status: DC

## 2016-08-03 MED ORDER — DULOXETINE HCL 60 MG PO CPEP: ORAL_CAPSULE | ORAL | 0 refills | Status: DC

## 2016-08-03 MED ORDER — DULOXETINE HCL 30 MG PO CPEP: ORAL_CAPSULE | ORAL | 0 refills | Status: DC

## 2016-08-03 NOTE — Patient Instructions (Signed)
1. Increase lamotrigine 150 mg daily 2. Increase duloxetine 90 mg daily 3. Continue ziprasidone 20 mg daily 4. Return to clinic next month 5. Continue to see Dr. Boris Lown for therapy
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
## 2016-08-03 NOTE — Therapy (Signed)
[facility] [place], [place], [address] Phone: [phone]   Fax:  [phone]  Physical Therapy Treatment  Patient Details  Name: Jasmine [name] MRN: [id number] Date of Birth: Feb 05, 1971 Referring Provider: Marella Chimes, PA  Encounter Date: 08/03/2016      PT End of Session - 08/03/16 1118    Visit Number 12   Number of Visits 16   Date for PT Re-Evaluation 08/20/16   PT Start Time 1119   PT Stop Time 1214   PT Time Calculation (min) 55 min   Activity Tolerance Patient tolerated treatment well   Behavior During Therapy [facility] for tasks assessed/performed      Past Medical History:  Diagnosis Date  . Anxiety   . Bipolar disorder ([place])   . CKD (chronic kidney disease), stage II   . Depression   . Diabetes mellitus   . DKA, type 1 ([place]) 11/04/2011  . Elevated cholesterol   . GERD (gastroesophageal reflux disease)   . Hypertension   . Hypothyroidism   . IBS (irritable bowel syndrome)   . Obesity   . Sleep apnea    HAS C -PAP / DOES NOT USE  . Stress incontinence   . Tobacco abuse   . UTI (lower urinary tract infection)     Past Surgical History:  Procedure Laterality Date  . INCONTINENCE SURGERY    . NASAL FRACTURE SURGERY    . ovary removed    . PUBOVAGINAL SLING  08/16/2011   Procedure: [name];  Surgeon: Bernestine Amass, MD;  Location: [facility];  Service: Urology;  Laterality: N/A;         . UTERINE FIBROID SURGERY  2001    There were no vitals filed for this visit.      Subjective Assessment - 08/03/16 1119    Subjective Patient has been having spasms in her back and down RLE. She has minimal pain in the LLE compared to initial eval.   Pertinent History DDD of cervical and lumbar region, CKD stage III, type I DM, bipolar disorder, IBS   Limitations Sitting;Standing;Walking   How long can you sit comfortably? 5 minutes   How long can you stand comfortably? 15 minutes   How long can you  walk comfortably? 20 minutes   Diagnostic tests xrays WNL taken 07/27/16   Patient Stated Goals less pain   Currently in Pain? Yes   Pain Score 3    Pain Location Back   Pain Orientation Left   Pain Descriptors / Indicators Aching;Sore   Pain Type Chronic pain   Pain Onset More than a month ago   Pain Frequency Intermittent   Aggravating Factors  prolonged activity   Pain Relieving Factors rest                         [facility] Adult PT Treatment/Exercise - 08/03/16 0001      Lumbar Exercises: Stretches   ITB Stretch 30 seconds  off EOB for QL also     Moist Heat Therapy   Number Minutes Moist Heat 15 Minutes   Moist Heat Location Lumbar Spine;Hip  R     Electrical Stimulation   Electrical Stimulation Location low back and R gluteals   Electrical Stimulation Action premod   Electrical Stimulation Parameters 80-150 Hz x 15 min   Electrical Stimulation Goals Pain     Manual Therapy   Manual Therapy Soft tissue mobilization   Manual  therapy comments to B lumbar/QL and gluteals/piriformis muscles in prone          Trigger Point Dry Needling - 08/03/16 1202    Consent Given? Yes   Education Handout Provided Yes   Muscles Treated Upper Body Quadratus Lumborum;Longissimus  B   Muscles Treated Lower Body Gluteus minimus;Gluteus maximus;Piriformis  B   Longissimus Response Twitch response elicited;Palpable increased muscle length   Gluteus Maximus Response Twitch response elicited;Palpable increased muscle length   Gluteus Minimus Response Twitch response elicited;Palpable increased muscle length   Piriformis Response Twitch response elicited;Palpable increased muscle length              PT Education - 08/03/16 1203    Education provided Yes   Education Details HEP; dry needling ecucation and aftercare   Person(s) Educated Patient   Methods Explanation;Demonstration;Handout   Comprehension Verbalized understanding;Returned demonstration              PT Long Term Goals - 08/03/16 1124      PT LONG TERM GOAL #1   Title patient to be independent with HEP (07/13/16)   Time 6   Period Weeks   Status Achieved     PT LONG TERM GOAL #2   Title Patient to improve L hip strength to >/= 4+/5 (07/13/16)   Baseline R hip ext 4-/5, B flex 4+/5, B hip ABD 5/5, L hip ext 5/5   Time 6   Period Weeks   Status Partially Met     PT LONG TERM GOAL #3   Title Patient to improve L lumbar lateral flexion and R rotation ROM with no increase in pain (07/13/16)   Baseline Rotation WNL; L SB slight decrease compared to R   Time 6   Period Weeks   Status Partially Met     PT LONG TERM GOAL #4   Title Patient to report ability to stand/walk for > 30 minutes with no increase in pain (07/13/16)   Baseline limited to 15 -20 min standing and walking   Time 6   Period Weeks   Status On-going     PT LONG TERM GOAL #5   Title Patient to demonstrate proper postural alignment with good core engagement (07/13/16)   Baseline patient continues to have L lateral shift in lumbar spine   Time 6   Period Weeks   Status On-going               Plan - 08/03/16 1206    Clinical Impression Statement Patient presents today with reports of decreased L sided symptoms, but increased spasm and RLE symptoms. She saw her MD and had xrays and they were negative. She responded well to DN today with ++ twitch responses in R QL/lumbar and gluteals/piriformis. She had twitch responses on L side as well but less intense. Patient is close to meeting her strength and ROM goals however pain is limiting her function mainly with standing and walking. She may benefit from further DN and continued core strengthening until her f/u with MD on August 17, 2016.   Rehab Potential Good   Clinical Impairments Affecting Rehab Potential DDD of cervical and lumbar region, CKD stage III, type I DM, bipolar disorder, IBS   PT Frequency 2x / week   PT Duration 3 weeks   PT  Treatment/Interventions ADLs/Self Care Home Management;Cryotherapy;Electrical Stimulation;Moist Heat;Ultrasound;Neuromuscular re-education;Balance training;Therapeutic exercise;Therapeutic activities;Functional mobility training;Stair training;Gait training;Patient/family education;Manual techniques;Passive range of motion;Vasopneumatic Device;Taping;Dry needling   PT Next Visit Plan recert sent  for 4 more visits until f/u visit. Assess DN. Continue with core strenghening. DN prn.   PT Home Exercise Plan SDLY QL stretch   Consulted and Agree with Plan of Care Patient      Patient will benefit from skilled therapeutic intervention in order to improve the following deficits and impairments:  Pain, Decreased mobility, Decreased endurance, Decreased strength, Decreased activity tolerance  Visit Diagnosis: Radiculopathy, lumbar region - Plan: PT plan of care cert/re-cert     Problem List Patient Active Problem List   Diagnosis Date Noted  . OSA (obstructive sleep apnea) 07/27/2016  . Wheezing 07/27/2016  . Hyperglycemia 12/25/2012  . Acute on chronic renal failure ([place]) 12/25/2012  . Pulmonary infiltrates 10/02/2012  . Postnasal drip 10/02/2012  . Hyperkalemia 09/25/2012  . Chronic kidney disease, stage III (moderate) 09/25/2012  . Morbid obesity ([place]) 09/24/2012  . HTN (hypertension) 09/24/2012  . Bipolar II disorder ([place]) 09/24/2012  . URI (upper respiratory infection) 09/24/2012  . DKA, type 1 ([place]) 11/04/2011  . Gastroenteritis 11/03/2011  . DM (diabetes mellitus), type 1, uncontrolled ([place]) 11/03/2011  . Hyponatremia 11/03/2011  . CKD (chronic kidney disease), stage II 11/03/2011  . Elevated lipase 11/03/2011  . Hypothyroidism 11/03/2011  . Tobacco abuse 11/03/2011  . SUI (stress urinary incontinence, female) 08/16/2011    Madelyn Flavors PT 08/03/2016, 12:20 PM  [facility] [address] [place], [place], [address] Phone:  [phone]   Fax:  [phone]  Name: RIYAN GAVINA MRN: [id number] Date of Birth: August 10, 1970

## 2016-08-04 ENCOUNTER — Encounter: Payer: Self-pay | Admitting: Physical Therapy

## 2016-08-17 DIAGNOSIS — G4733 Obstructive sleep apnea (adult) (pediatric): Secondary | ICD-10-CM | POA: Diagnosis not present

## 2016-08-27 ENCOUNTER — Other Ambulatory Visit: Payer: Self-pay | Admitting: *Deleted

## 2016-08-27 DIAGNOSIS — G4733 Obstructive sleep apnea (adult) (pediatric): Secondary | ICD-10-CM | POA: Diagnosis not present

## 2016-08-27 DIAGNOSIS — R400 Somnolence: Secondary | ICD-10-CM

## 2016-08-30 ENCOUNTER — Telehealth: Payer: Self-pay | Admitting: Internal Medicine

## 2016-08-30 DIAGNOSIS — G4733 Obstructive sleep apnea (adult) (pediatric): Secondary | ICD-10-CM

## 2016-08-30 NOTE — Telephone Encounter (Signed)
Dr. Chase Caller  Please Advise-  Your pt saw TP on 07/27/16 and TP ordered a home sleep study. Pt called in wanting to know the results.
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
## 2016-08-31 ENCOUNTER — Encounter: Payer: Self-pay | Admitting: Adult Health

## 2016-08-31 ENCOUNTER — Encounter (HOSPITAL_COMMUNITY): Payer: Self-pay | Admitting: Psychiatry

## 2016-08-31 ENCOUNTER — Ambulatory Visit (INDEPENDENT_AMBULATORY_CARE_PROVIDER_SITE_OTHER): Payer: BLUE CROSS/BLUE SHIELD | Admitting: Psychiatry

## 2016-08-31 VITALS — BP 124/77 | HR 68 | Ht 69.0 in | Wt 298.2 lb

## 2016-08-31 DIAGNOSIS — F3181 Bipolar II disorder: Secondary | ICD-10-CM | POA: Diagnosis not present

## 2016-08-31 DIAGNOSIS — Z79899 Other long term (current) drug therapy: Secondary | ICD-10-CM | POA: Diagnosis not present

## 2016-08-31 DIAGNOSIS — R45851 Suicidal ideations: Secondary | ICD-10-CM

## 2016-08-31 DIAGNOSIS — Z87891 Personal history of nicotine dependence: Secondary | ICD-10-CM | POA: Diagnosis not present

## 2016-08-31 DIAGNOSIS — Z818 Family history of other mental and behavioral disorders: Secondary | ICD-10-CM

## 2016-08-31 DIAGNOSIS — Z7982 Long term (current) use of aspirin: Secondary | ICD-10-CM | POA: Diagnosis not present

## 2016-08-31 DIAGNOSIS — Z794 Long term (current) use of insulin: Secondary | ICD-10-CM | POA: Diagnosis not present

## 2016-08-31 MED ORDER — DULOXETINE HCL 60 MG PO CPEP: 60.0000 mg | ORAL_CAPSULE | Freq: Two times a day (BID) | ORAL | 1 refills | Status: DC

## 2016-08-31 MED ORDER — DULOXETINE HCL 60 MG PO CPEP: 120.0000 mg | ORAL_CAPSULE | Freq: Every day | ORAL | 1 refills | Status: DC

## 2016-08-31 MED ORDER — LAMOTRIGINE 150 MG PO TABS: 150.0000 mg | ORAL_TABLET | Freq: Every day | ORAL | 1 refills | Status: DC

## 2016-08-31 NOTE — Patient Instructions (Addendum)
1. Continue lamotrigine 150 mg daily 2. Increase duloxetine 60 mg twice a day 3. Discontinue ziprasidone 4. Return to clinic in one month

## 2016-09-01 NOTE — Telephone Encounter (Signed)
HST done per 2.20.18 ov w/ TP - results are in Epic Pt is scheduled to see MR as instructed on 4.18.18 w/ PFT  Pt is requesting HST results/recs and pending these results if she should keep her upcoming appts TP not in the office this afternoon  MR please advise, thank you *will CC TP since she ordered the HST  Patient Instructions  Begin Allegra daily for drainage  Continue on Flonase daily  Continue on Saline nasal rinses.  Delsym 2 tsp Twice daily  As needed  Cough .  GERD diet  Continue on Prilosec 20mg  daily .  Set up for PFT in 4-6 weeks with Dr. Chase Caller . And As needed   Please contact office for sooner follow up if symptoms do not improve or worsen or seek emergency care    Set up for home sleep study .

## 2016-09-01 NOTE — Telephone Encounter (Signed)
Guys  I have not seen the patient in nearly 3 years come many 2018. Also, I am not sleep certified. ANd I Do not know where the results are to look at and interpret. Please have Tammy Parrett give the results. I am not if office till mid April 2018  Thanks  Dr. Brand Males, M.D., F.C.C.P Pulmonary and Critical Care Medicine Staff Physician [facility] Pager: [phone], If no answer or between  15:00h - 7:00h: call [phone]  09/01/2016 4:46 PM

## 2016-09-01 NOTE — Telephone Encounter (Signed)
Okay, thank you. Routing to TP for recommendations

## 2016-09-01 NOTE — Telephone Encounter (Signed)
Please see phone note from 3.26.18. Will sign off.

## 2016-09-02 NOTE — Telephone Encounter (Signed)
Got HST results back shows Moderate OSA  Can come in for resutls or start on CPAP   Begin CPAP 5-15cmH2O , mask of choice, heated humidity, enroll in airview  Ov in 6 weeks with download.  Wt loss  Do not drive if sleepy

## 2016-09-02 NOTE — Telephone Encounter (Signed)
Called spoke with patient, discussed HST results/recs as stated by TP.  Pt voiced her understanding and is okay with beginning CPAP.  Order placed to [facility] in [place] - pt is aware it will be Monday 4.2.18 before order is processed.  Nothing further needed; will sign off.

## 2016-09-14 ENCOUNTER — Encounter (HOSPITAL_COMMUNITY): Payer: Self-pay | Admitting: Emergency Medicine

## 2016-09-14 ENCOUNTER — Emergency Department (HOSPITAL_COMMUNITY)
Admission: EM | Admit: 2016-09-14 | Discharge: 2016-09-14 | Disposition: A | Discharge status: Home / Self Care | Payer: BLUE CROSS/BLUE SHIELD | Attending: Emergency Medicine | Admitting: Emergency Medicine

## 2016-09-14 DIAGNOSIS — E1122 Type 2 diabetes mellitus with diabetic chronic kidney disease: Secondary | ICD-10-CM | POA: Insufficient documentation

## 2016-09-14 DIAGNOSIS — R739 Hyperglycemia, unspecified: Secondary | ICD-10-CM

## 2016-09-14 DIAGNOSIS — Z794 Long term (current) use of insulin: Secondary | ICD-10-CM | POA: Insufficient documentation

## 2016-09-14 DIAGNOSIS — E039 Hypothyroidism, unspecified: Secondary | ICD-10-CM | POA: Insufficient documentation

## 2016-09-14 DIAGNOSIS — Z79899 Other long term (current) drug therapy: Secondary | ICD-10-CM | POA: Insufficient documentation

## 2016-09-14 DIAGNOSIS — Z87891 Personal history of nicotine dependence: Secondary | ICD-10-CM | POA: Insufficient documentation

## 2016-09-14 DIAGNOSIS — N183 Chronic kidney disease, stage 3 (moderate): Secondary | ICD-10-CM | POA: Insufficient documentation

## 2016-09-14 DIAGNOSIS — Z7982 Long term (current) use of aspirin: Secondary | ICD-10-CM | POA: Insufficient documentation

## 2016-09-14 DIAGNOSIS — E1165 Type 2 diabetes mellitus with hyperglycemia: Secondary | ICD-10-CM | POA: Insufficient documentation

## 2016-09-14 DIAGNOSIS — I129 Hypertensive chronic kidney disease with stage 1 through stage 4 chronic kidney disease, or unspecified chronic kidney disease: Secondary | ICD-10-CM | POA: Insufficient documentation

## 2016-09-14 LAB — CBC WITH DIFFERENTIAL/PLATELET
BASOS ABS: 0.1 10*3/uL (ref 0.0–0.1)
BASOS PCT: 1 %
EOS ABS: 0.3 10*3/uL (ref 0.0–0.7)
Eosinophils Relative: 3 %
HCT: 40.2 % (ref 36.0–46.0)
HEMOGLOBIN: 13.2 g/dL (ref 12.0–15.0)
Lymphocytes Relative: 27 %
Lymphs Abs: 2.2 10*3/uL (ref 0.7–4.0)
MCH: 29.7 pg (ref 26.0–34.0)
MCHC: 32.8 g/dL (ref 30.0–36.0)
MCV: 90.5 fL (ref 78.0–100.0)
MONOS PCT: 7 %
Monocytes Absolute: 0.6 10*3/uL (ref 0.1–1.0)
Neutro Abs: 5 10*3/uL (ref 1.7–7.7)
Neutrophils Relative %: 62 %
Platelets: 256 10*3/uL (ref 150–400)
RBC: 4.44 MIL/uL (ref 3.87–5.11)
RDW: 13 % (ref 11.5–15.5)
WBC: 8.1 10*3/uL (ref 4.0–10.5)

## 2016-09-14 LAB — BLOOD GAS, VENOUS
Acid-base deficit: 1.5 mmol/L (ref 0.0–2.0)
Bicarbonate: 22.7 mmol/L (ref 20.0–28.0)
O2 Saturation: 81.3 %
PH VEN: 7.36 (ref 7.250–7.430)
pCO2, Ven: 42.1 mmHg — ABNORMAL LOW (ref 44.0–60.0)
pO2, Ven: 49.5 mmHg — ABNORMAL HIGH (ref 32.0–45.0)

## 2016-09-14 LAB — URINALYSIS, ROUTINE W REFLEX MICROSCOPIC
Bacteria, UA: NONE SEEN
Bilirubin Urine: NEGATIVE
HGB URINE DIPSTICK: NEGATIVE
Ketones, ur: NEGATIVE mg/dL
Leukocytes, UA: NEGATIVE
Nitrite: NEGATIVE
PH: 6 (ref 5.0–8.0)
PROTEIN: NEGATIVE mg/dL
SPECIFIC GRAVITY, URINE: 1.01 (ref 1.005–1.030)

## 2016-09-14 LAB — BASIC METABOLIC PANEL
Anion gap: 9 (ref 5–15)
BUN: 29 mg/dL — AB (ref 6–20)
CO2: 22 mmol/L (ref 22–32)
CREATININE: 1.71 mg/dL — AB (ref 0.44–1.00)
Calcium: 9 mg/dL (ref 8.9–10.3)
Chloride: 96 mmol/L — ABNORMAL LOW (ref 101–111)
GFR calc Af Amer: 41 mL/min — ABNORMAL LOW (ref >60)
GFR, EST NON AFRICAN AMERICAN: 35 mL/min — AB (ref >60)
GLUCOSE: 516 mg/dL — AB (ref 65–99)
POTASSIUM: 4.3 mmol/L (ref 3.5–5.1)
SODIUM: 127 mmol/L — AB (ref 135–145)

## 2016-09-14 LAB — CBG MONITORING, ED
GLUCOSE-CAPILLARY: 443 mg/dL — AB (ref 65–99)
Glucose-Capillary: 214 mg/dL — ABNORMAL HIGH (ref 65–99)

## 2016-09-14 MED ORDER — PROMETHAZINE HCL 12.5 MG PO TABS
25.0000 mg | ORAL_TABLET | Freq: Once | ORAL | Status: AC
  Administered 2016-09-14: 25 mg via ORAL
  Filled 2016-09-14: qty 2

## 2016-09-14 MED ORDER — SODIUM CHLORIDE 0.9 % IV BOLUS (SEPSIS)
1000.0000 mL | Freq: Once | INTRAVENOUS | Status: AC
  Administered 2016-09-14: 1000 mL via INTRAVENOUS

## 2016-09-14 NOTE — ED Triage Notes (Signed)
Pt sugar last night was 200. Woke this am with sugar over 600. Checked 20 min ago and was over 600. c/o nausea. gen weakness noted.

## 2016-09-14 NOTE — ED Provider Notes (Signed)
[facility] DEPT Provider Note   CSN: [id number] Arrival date & time: 09/14/16  5462   By signing my name below, I, Jasmine [name], attest that this documentation has been prepared under the direction and in the presence of Jasmine Gambler, MD. Electronically signed, Jasmine [name], ED Scribe. 09/14/16. 10:54 AM.   History   Chief Complaint Chief Complaint  Patient presents with  . Hyperglycemia   The history is provided by the patient and medical records. No language interpreter was used.    Jasmine [name] is a 46 y.o. female with h/o T1DM, CKD, DKA, multiple abnormal labs and chronic renal failure, self transported to the Emergency Department with concern for abnormal CBG and ketone readings measured at home since last nigh. She states her CBG normally ranges < 250, but last night she obtained a reading of 400 and this morning she had a reading of > 600. Pt currently c/o nausea, headache and dehydration; she states this is consistent with prior high CBG readings. She adds she administered insulin to herself via her insulin pump following the first high reading with temporary relief. She further notes an occlusion in her insulin pump between 6 AM and 9 AM this morning that has since cleared up. Pt denies fever, vomiting, diarrhea, chest pain and abdominal pain.    Past Medical History:  Diagnosis Date  . Anxiety   . Bipolar disorder ([place])   . CKD (chronic kidney disease), stage II   . Depression   . Diabetes mellitus   . DKA, type 1 ([place]) 11/04/2011  . Elevated cholesterol   . GERD (gastroesophageal reflux disease)   . Hypertension   . Hypothyroidism   . IBS (irritable bowel syndrome)   . Obesity   . Sleep apnea    HAS C -PAP / DOES NOT USE  . Stress incontinence   . Tobacco abuse   . UTI (lower urinary tract infection)     Patient Active Problem List   Diagnosis Date Noted  . OSA (obstructive sleep apnea) 07/27/2016  . Wheezing 07/27/2016  . Hyperglycemia 12/25/2012   . Acute on chronic renal failure ([place]) 12/25/2012  . Pulmonary infiltrates 10/02/2012  . Postnasal drip 10/02/2012  . Hyperkalemia 09/25/2012  . Chronic kidney disease, stage III (moderate) 09/25/2012  . Morbid obesity ([place]) 09/24/2012  . HTN (hypertension) 09/24/2012  . Bipolar II disorder ([place]) 09/24/2012  . URI (upper respiratory infection) 09/24/2012  . DKA, type 1 ([place]) 11/04/2011  . Gastroenteritis 11/03/2011  . DM (diabetes mellitus), type 1, uncontrolled ([place]) 11/03/2011  . Hyponatremia 11/03/2011  . CKD (chronic kidney disease), stage II 11/03/2011  . Elevated lipase 11/03/2011  . Hypothyroidism 11/03/2011  . Tobacco abuse 11/03/2011  . SUI (stress urinary incontinence, female) 08/16/2011    Past Surgical History:  Procedure Laterality Date  . INCONTINENCE SURGERY    . NASAL FRACTURE SURGERY    . ovary removed    . PUBOVAGINAL SLING  08/16/2011   Procedure: [name];  Surgeon: Bernestine Amass, MD;  Location: [facility];  Service: Urology;  Laterality: N/A;         . UTERINE FIBROID SURGERY  2001    OB History    Gravida Para Term Preterm AB Living   1             SAB TAB Ectopic Multiple Live Births                   Home Medications    Prior  to Admission medications   Medication Sig Start Date End Date Taking? Authorizing Provider  acetaminophen (TYLENOL) 500 MG tablet Take 1,000 mg by mouth every 6 (six) hours as needed. For pain   Yes Historical Provider, MD  aspirin EC 81 MG tablet Take 81 mg by mouth daily.   Yes Historical Provider, MD  carvedilol (COREG) 25 MG tablet Take 37.5 mg by mouth 2 (two) times daily with a meal.    Yes Historical Provider, MD  cholecalciferol (VITAMIN D) 1000 UNITS tablet Take 1,000 Units by mouth daily.   Yes Historical Provider, MD  diazepam (VALIUM) 5 MG tablet Take 5 mg by mouth 2 (two) times daily as needed for anxiety.   Yes Historical Provider, MD  Diclofenac Sodium 2 % SOLN Place onto the skin as needed.    Yes Historical Provider, MD  dicyclomine (BENTYL) 10 MG capsule Take 1 capsule (10 mg total) by mouth 3 (three) times daily as needed (abdominal pain). 10/29/15  Yes Merrily Pew, MD  DULoxetine (CYMBALTA) 60 MG capsule Take 1 capsule (60 mg total) by mouth 2 (two) times daily. 08/31/16  Yes Norman Clay, MD  fish oil-omega-3 fatty acids 1000 MG capsule Take 1 g by mouth daily.   Yes Historical Provider, MD  fluticasone (FLONASE) 50 MCG/ACT nasal spray Place 2 sprays into the nose daily.    Yes Historical Provider, MD  folic acid (FOLVITE) 161 MCG tablet Take 800 mcg by mouth daily.   Yes Historical Provider, MD  furosemide (LASIX) 40 MG tablet Take 40 mg by mouth daily.    Yes Historical Provider, MD  insulin aspart (NOVOLOG) 100 UNIT/ML injection Inject into the skin continuous. Daily Bolus of 2.9 units every hour   Yes Historical Provider, MD  lamoTRIgine (LAMICTAL) 150 MG tablet Take 1 tablet (150 mg total) by mouth daily. 08/31/16  Yes Norman Clay, MD  levothyroxine (SYNTHROID, LEVOTHROID) 150 MCG tablet Take 150 mcg by mouth daily before breakfast.   Yes Historical Provider, MD  lubiprostone (AMITIZA) 24 MCG capsule Take 24 mcg by mouth every other day.   Yes Historical Provider, MD  medroxyPROGESTERone (PROVERA) 10 MG tablet Take 20 mg by mouth as directed. Only takes the first 5 days of every third month   Yes Historical Provider, MD  omeprazole (PRILOSEC) 20 MG capsule Take 20 mg by mouth at bedtime.   Yes Historical Provider, MD  pravastatin (PRAVACHOL) 80 MG tablet Take 80 mg by mouth at bedtime.   Yes Historical Provider, MD  promethazine (PHENERGAN) 25 MG tablet Take 1 tablet (25 mg total) by mouth every 6 (six) hours as needed for nausea or vomiting. 06/12/16  Yes Almyra Free Idol, PA-C  Psyllium (METAMUCIL FIBER PO) Take by mouth daily.   Yes Historical Provider, MD  SALINE NASAL MIST NA Place into the nose every morning.   Yes Historical Provider, MD  Simethicone (PHAZYME MAXIMUM STRENGTH) 250  MG CAPS Take 250 mg by mouth as needed.   Yes Historical Provider, MD  tiZANidine (ZANAFLEX) 4 MG capsule Take 4 mg by mouth as needed.    Yes Historical Provider, MD  traMADol (ULTRAM) 50 MG tablet Take 50 mg by mouth 2 (two) times daily as needed.    Yes Historical Provider, MD    Family History Family History  Problem Relation Age of Onset  . Asthma Mother   . Heart disease Father   . Cancer Paternal Grandmother     lung and breast  . Bladder Cancer Paternal Grandfather  Social History Social History  Substance Use Topics  . Smoking status: Former Smoker    Packs/day: 0.50    Years: 20.00    Types: Cigarettes  . Smokeless tobacco: Never Used  . Alcohol use No     Comment: 05-20-16 per pt no     Allergies   Ciprofloxacin; Buspar [buspirone]; Advair diskus [fluticasone-salmeterol]; Levaquin [levofloxacin]; Ondansetron; Biaxin [clarithromycin]; and Hydroxyzine   Review of Systems Review of Systems  Constitutional: Positive for appetite change (increased thirst). Negative for fever.  Cardiovascular: Negative for chest pain.  Gastrointestinal: Positive for nausea. Negative for abdominal pain, diarrhea and vomiting.  Neurological: Positive for headaches.  All other systems reviewed and are negative.    Physical Exam Updated Vital Signs BP 130/74 (BP Location: Left Arm)   Pulse 76   Temp 97.6 F (36.4 C) (Oral)   Resp 17   Ht 5\' 9"  (1.753 m)   Wt 296 lb (134.3 kg)   LMP 06/16/2016   SpO2 94%   BMI 43.71 kg/m   Physical Exam  Constitutional: She is oriented to person, place, and time. She appears well-developed and well-nourished.  Morbidly obese  HENT:  Head: Normocephalic and atraumatic.  Right Ear: External ear normal.  Left Ear: External ear normal.  Nose: Nose normal.  Eyes: Right eye exhibits no discharge. Left eye exhibits no discharge.  Cardiovascular: Normal rate, regular rhythm and normal heart sounds.   Pulmonary/Chest: Effort normal and  breath sounds normal.  Abdominal: Soft. There is no tenderness.  Neurological: She is alert and oriented to person, place, and time.  Skin: Skin is warm and dry.  Nursing note and vitals reviewed.    ED Treatments / Results  DIAGNOSTIC STUDIES: Oxygen Saturation is 94% on RA, low by my interpretation.    COORDINATION OF CARE: 10:35 AM Discussed treatment plan with pt at bedside and pt agreed to plan. Will order blood work and fluids then reassess.  Labs (all labs ordered are listed, but only abnormal results are displayed) Labs Reviewed  BASIC METABOLIC PANEL - Abnormal; Notable for the following:       Result Value   Sodium 127 (*)    Chloride 96 (*)    Glucose, Bld 516 (*)    BUN 29 (*)    Creatinine, Ser 1.71 (*)    GFR calc non Af Amer 35 (*)    GFR calc Af Amer 41 (*)    All other components within normal limits  URINALYSIS, ROUTINE W REFLEX MICROSCOPIC - Abnormal; Notable for the following:    Color, Urine STRAW (*)    Glucose, UA >=500 (*)    Squamous Epithelial / LPF 0-5 (*)    All other components within normal limits  BLOOD GAS, VENOUS - Abnormal; Notable for the following:    pCO2, Ven 42.1 (*)    pO2, Ven 49.5 (*)    All other components within normal limits  CBG MONITORING, ED - Abnormal; Notable for the following:    Glucose-Capillary 443 (*)    All other components within normal limits  CBG MONITORING, ED - Abnormal; Notable for the following:    Glucose-Capillary 214 (*)    All other components within normal limits  CBC WITH DIFFERENTIAL/PLATELET    EKG  EKG Interpretation None       Radiology No results found.  Procedures Procedures (including critical care time)  Medications Ordered in ED Medications  sodium chloride 0.9 % bolus 1,000 mL (0 mLs Intravenous Stopped 09/14/16  1257)  sodium chloride 0.9 % bolus 1,000 mL (0 mLs Intravenous Stopped 09/14/16 1258)  promethazine (PHENERGAN) tablet 25 mg (25 mg Oral Given 09/14/16 1104)      Initial Impression / Assessment and Plan / ED Course  I have reviewed the triage vital signs and the nursing notes.  Pertinent labs & imaging results that were available during my care of the patient were reviewed by me and considered in my medical decision making (see chart for details).  Clinical Course as of Sep 14 1648  Tue Sep 14, 2016  1046 Will evaluate for DKA. Most likely this is recurrent hyperglycemia. She is already feeling better. Zofran for nausea. I doubt infectious or metabolic cause of her hyperglycemia, likely was from the pump malfunction.  [SG]    Clinical Course User Index [SG] Jasmine Gambler, MD    Labs are c/w hyperglycemia without DKA. Bicarb borderline at 22 but no acidosis or anion gap. No ketones in urine. She is feeling better with fluids. Glucose now in low 200s. f/u with PCP and/or endocrinologist. Discussed return precautions.  Final Clinical Impressions(s) / ED Diagnoses   Final diagnoses:  Hyperglycemia    New Prescriptions Discharge Medication List as of 09/14/2016 12:57 PM     I personally performed the services described in this documentation, which was scribed in my presence. The recorded information has been reviewed and is accurate.    Jasmine Gambler, MD 09/14/16 [phone]

## 2016-09-14 NOTE — ED Notes (Signed)
\  CRITICAL VALUE ALERT  Critical value received:  Glucose 516  Date of notification:  09/14/16  Time of notification:  2633  Critical value read back:Yes.    Nurse who received alert:  RMinter,RN  MD notified (1st page):  Dr. Regenia Skeeter  Time of first page:  1056  MD notified (2nd page):  Time of second page:  Responding MD:  Dr. Regenia Skeeter  Time MD responded:  [phone]

## 2016-09-22 ENCOUNTER — Encounter: Payer: Self-pay | Admitting: Internal Medicine

## 2016-09-22 ENCOUNTER — Ambulatory Visit (HOSPITAL_COMMUNITY)
Admission: RE | Admit: 2016-09-22 | Discharge: 2016-09-22 | Disposition: A | Discharge status: Home / Self Care | Payer: BLUE CROSS/BLUE SHIELD | Source: Ambulatory Visit | Attending: Internal Medicine | Admitting: Internal Medicine

## 2016-09-22 ENCOUNTER — Ambulatory Visit (INDEPENDENT_AMBULATORY_CARE_PROVIDER_SITE_OTHER): Payer: BLUE CROSS/BLUE SHIELD | Admitting: Internal Medicine

## 2016-09-22 VITALS — BP 120/84 | HR 70 | Ht 69.0 in | Wt 303.4 lb

## 2016-09-22 DIAGNOSIS — R942 Abnormal results of pulmonary function studies: Secondary | ICD-10-CM | POA: Diagnosis not present

## 2016-09-22 DIAGNOSIS — R0600 Dyspnea, unspecified: Secondary | ICD-10-CM | POA: Diagnosis not present

## 2016-09-22 DIAGNOSIS — R0689 Other abnormalities of breathing: Secondary | ICD-10-CM | POA: Diagnosis not present

## 2016-09-22 DIAGNOSIS — R918 Other nonspecific abnormal finding of lung field: Secondary | ICD-10-CM

## 2016-09-22 LAB — PULMONARY FUNCTION TEST
DL/VA % PRED: 80 %
DL/VA: 4.3 ml/min/mmHg/L
DLCO COR % PRED: 66 %
DLCO cor: 20.75 ml/min/mmHg
DLCO unc % pred: 66 %
DLCO unc: 20.62 ml/min/mmHg
FEF 25-75 POST: 3.64 L/s
FEF 25-75 Pre: 3.43 L/sec
FEF2575-%CHANGE-POST: 6 %
FEF2575-%Pred-Post: 111 %
FEF2575-%Pred-Pre: 105 %
FEV1-%Change-Post: 3 %
FEV1-%Pred-Post: 87 %
FEV1-%Pred-Pre: 84 %
FEV1-POST: 2.98 L
FEV1-Pre: 2.87 L
FEV1FVC-%CHANGE-POST: -2 %
FEV1FVC-%Pred-Pre: 110 %
FEV6-%Change-Post: 5 %
FEV6-%PRED-PRE: 77 %
FEV6-%Pred-Post: 81 %
FEV6-PRE: 3.21 L
FEV6-Post: 3.39 L
FEV6FVC-%PRED-PRE: 102 %
FEV6FVC-%Pred-Post: 102 %
FVC-%CHANGE-POST: 5 %
FVC-%PRED-POST: 79 %
FVC-%PRED-PRE: 75 %
FVC-POST: 3.4 L
FVC-PRE: 3.22 L
POST FEV1/FVC RATIO: 87 %
POST FEV6/FVC RATIO: 100 %
PRE FEV1/FVC RATIO: 89 %
Pre FEV6/FVC Ratio: 100 %
RV % pred: 73 %
RV: 1.42 L
TLC % PRED: 84 %
TLC: 4.93 L

## 2016-09-22 LAB — NITRIC OXIDE: Nitric Oxide: 20

## 2016-09-22 MED ORDER — ALBUTEROL SULFATE (2.5 MG/3ML) 0.083% IN NEBU
2.5000 mg | INHALATION_SOLUTION | Freq: Once | RESPIRATORY_TRACT | Status: AC
  Administered 2016-09-22: 2.5 mg via RESPIRATORY_TRACT

## 2016-09-22 NOTE — Addendum Note (Signed)
Addended by: Benson Setting L on: 09/22/2016 12:30 PM   Modules accepted: Orders

## 2016-09-22 NOTE — Addendum Note (Signed)
Addended by: Collier Salina on: 09/22/2016 01:25 PM   Modules accepted: Orders

## 2016-09-22 NOTE — Patient Instructions (Addendum)
ICD-9-CM ICD-10-CM   1. Dyspnea and respiratory abnormality 786.09 R06.00     R06.89   2. Pulmonary infiltrates 793.19 R91.8   3. Abnormal PFTs (pulmonary function tests) 794.2 R94.2    Do HRCT chest supine and prone Start CPAP per schedule asap  followup  will callw ith CT results and decide next step of followup Make appt to a sleep doc in our practice
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
## 2016-09-22 NOTE — Progress Notes (Signed)
Subjective:     Patient ID: Jasmine [name], female   DOB: March 31, 1971, 46 y.o.   MRN: [id number]  HPI   Chief Complaint  Patient presents with  . Follow-up    OSA and Asthma     Referring provider: Aletha Halim., PA-C  HPI: 46 yo female former smoker with PMH of DM (Type I ) and Bipolar dz referred to our office for asth   TEST  Pulmonary function testing dated 05/01/2012 FEV1 2.8 L/82%. FVC 2.3 L/79%. Total lung capacity 4.75 L 82%. DLCO of 18.7 per/55%   07/27/2016 Follow Up : Referred to our office for asthma and OSA  Pt presents to our office after referral from PCP . She has recently had wheezing and shortness of breath . She says she was dx with asthma and started on proair.   She was having wheezing , dry cough and post nasal drip , throat clearing for last 3 months. . Was started on ProAir . She uses it 1-2 x day. W/ only minimal improvement .  She is former smoker , 1 PPD x 20 yr.   Last seen in our office 2015. Was seen in past for pulmonary infiltrates. Serial CT chest showed improvement with last CT chest in 2015 mild scarring in right lung.   Says she carries a dx of moderate OSA ~7 yr ago. Did not wear CPAP and had to return it due to insurance coverage. She wants to be checked again because she is having trouble sleeping, has snoring and significant daytime sleepiness.    OV 09/22/2016  Chief Complaint  Patient presents with  . Follow-up    Pt here after PFT and sleep study. Pt states her breathing is unchanged since last OV with TP on 2.20.18. Pt states she has a cough with intermittent mucus production with clear to yellow mucus and chest tightness. Pt denies f/c/s.    46 year old morbidly obese female with a prior history of sleep apnea and some interstitial infiltrates not otherwise specified. I personally not seen this patient in 3 years. She tells me that in the interim much of her shortness of breath and wheezing improved/resolved. Then in the last  few years or so it has come back and is slowly progressive and but now stabilized. It is mild to moderate in intensity. This associated wheezing that occurs randomly. There is exertional dyspnea relieved by rest. Is also some cough. In February 2018 she did see nurse practitioner and sleep apnea was confirmed. CPAP has been ordered and she is weaning for this. She is here for follow-up. Her pulmonary function tests is obscured by obesity is documented below. She also tells me that she was in the emergency room sitting when she went in for her diabetes and she was hypoxemic    Pulmonary function test 09/22/2016 shows mild restriction on spirometry but normal lung volume and mild reduction in DLCO consistent with obesity.  Venous blood gas 09/14/2016 shows pH 7.3 6742 and a PO2 of 49  Creatinine 09/14/2016 is elevated at 1.7 mg percent.   Exhaled nitric oxide today in our office :  20 pbb and normal  Walk test in office 09/22/2016  : As he walked 3 laps on room air: She desaturated down to 84% and started feeling dyspneic and wanted to stop. However we continued to push her and the pulse ox rebounded to 94% and she started feeling better.    has a past medical history of Anxiety; Bipolar disorder (  [place]); CKD (chronic kidney disease), stage II; Depression; Diabetes mellitus; DKA, type 1 ([place]) (11/04/2011); Elevated cholesterol; GERD (gastroesophageal reflux disease); Hypertension; Hypothyroidism; IBS (irritable bowel syndrome); Obesity; Sleep apnea; Stress incontinence; Tobacco abuse; and UTI (lower urinary tract infection).   reports that she quit smoking about 4 years ago. Her smoking use included Cigarettes. She has a 15.00 pack-year smoking history. She has never used smokeless tobacco.  Past Surgical History:  Procedure Laterality Date  . INCONTINENCE SURGERY    . NASAL FRACTURE SURGERY    . ovary removed    . PUBOVAGINAL SLING  08/16/2011   Procedure: [name];  Surgeon: Bernestine Amass, MD;  Location: [facility];  Service: Urology;  Laterality: N/A;         . UTERINE FIBROID SURGERY  2001    Allergies  Allergen Reactions  . Ciprofloxacin Swelling    Per pt caused lips swell and nauseous feeling  . Buspar [Buspirone] Other (See Comments)    abd cramping  . Advair Diskus [Fluticasone-Salmeterol] Other (See Comments)    Thrush   . Levaquin [Levofloxacin]     Per pt caused lips swell and nauseous feeling  . Ondansetron Other (See Comments)    hiccups  . Biaxin [Clarithromycin] Rash  . Hydroxyzine Palpitations    other    Immunization History  Administered Date(s) Administered  . Influenza Split 04/07/2012, 04/07/2013  . Influenza,inj,Quad PF,36+ Mos 03/03/2009, 04/07/2012, 04/07/2013, 03/10/2016  . Pneumococcal Polysaccharide-23 04/08/2011  . Pneumococcal-Unspecified 04/08/2011  . Tdap 04/21/2016    Family History  Problem Relation Age of Onset  . Asthma Mother   . Heart disease Father   . Cancer Paternal Grandmother     lung and breast  . Bladder Cancer Paternal Grandfather      Current Outpatient Prescriptions:  .  acetaminophen (TYLENOL) 500 MG tablet, Take 1,000 mg by mouth every 6 (six) hours as needed. For pain, Disp: , Rfl:  .  aspirin EC 81 MG tablet, Take 81 mg by mouth daily., Disp: , Rfl:  .  carvedilol (COREG) 25 MG tablet, Take 37.5 mg by mouth 2 (two) times daily with a meal. , Disp: , Rfl:  .  cholecalciferol (VITAMIN D) 1000 UNITS tablet, Take 1,000 Units by mouth daily., Disp: , Rfl:  .  diazepam (VALIUM) 5 MG tablet, Take 5 mg by mouth 2 (two) times daily as needed for anxiety., Disp: , Rfl:  .  Diclofenac Sodium 2 % SOLN, Place onto the skin as needed., Disp: , Rfl:  .  dicyclomine (BENTYL) 10 MG capsule, Take 1 capsule (10 mg total) by mouth 3 (three) times daily as needed (abdominal pain)., Disp: 30 capsule, Rfl: 0 .  DULoxetine (CYMBALTA) 60 MG capsule, Take 1 capsule (60 mg total) by mouth 2 (two) times daily., Disp: 60  capsule, Rfl: 1 .  fish oil-omega-3 fatty acids 1000 MG capsule, Take 1 g by mouth daily., Disp: , Rfl:  .  fluticasone (FLONASE) 50 MCG/ACT nasal spray, Place 2 sprays into the nose daily. , Disp: , Rfl:  .  folic acid (FOLVITE) 902 MCG tablet, Take 800 mcg by mouth daily., Disp: , Rfl:  .  furosemide (LASIX) 40 MG tablet, Take 40 mg by mouth daily. , Disp: , Rfl:  .  insulin aspart (NOVOLOG) 100 UNIT/ML injection, Inject into the skin continuous. Daily Bolus of 2.9 units every hour, Disp: , Rfl:  .  lamoTRIgine (LAMICTAL) 150 MG tablet, Take 1 tablet (150 mg total) by  mouth daily., Disp: 30 tablet, Rfl: 1 .  levothyroxine (SYNTHROID, LEVOTHROID) 150 MCG tablet, Take 150 mcg by mouth daily before breakfast., Disp: , Rfl:  .  lubiprostone (AMITIZA) 24 MCG capsule, Take 24 mcg by mouth every other day., Disp: , Rfl:  .  medroxyPROGESTERone (PROVERA) 10 MG tablet, Take 20 mg by mouth as directed. Only takes the first 5 days of every third month, Disp: , Rfl:  .  omeprazole (PRILOSEC) 20 MG capsule, Take 20 mg by mouth at bedtime., Disp: , Rfl:  .  pravastatin (PRAVACHOL) 80 MG tablet, Take 80 mg by mouth at bedtime., Disp: , Rfl:  .  promethazine (PHENERGAN) 25 MG tablet, Take 1 tablet (25 mg total) by mouth every 6 (six) hours as needed for nausea or vomiting., Disp: 20 tablet, Rfl: 0 .  SALINE NASAL MIST NA, Place into the nose every morning., Disp: , Rfl:  .  traMADol (ULTRAM) 50 MG tablet, Take 50 mg by mouth 2 (two) times daily as needed. , Disp: , Rfl:    Review of Systems     Objective:   Physical Exam  Constitutional: She is oriented to person, place, and time. She appears well-developed and well-nourished. No distress.  Morbidly obese  HENT:  Head: Normocephalic and atraumatic.  Right Ear: External ear normal.  Left Ear: External ear normal.  Mouth/Throat: Oropharynx is clear and moist. No oropharyngeal exudate.  Eyes: Conjunctivae and EOM are normal. Pupils are equal, round, and  reactive to light. Right eye exhibits no discharge. Left eye exhibits no discharge. No scleral icterus.  Neck: Normal range of motion. Neck supple. No JVD present. No tracheal deviation present. No thyromegaly present.  Cardiovascular: Normal rate, regular rhythm, normal heart sounds and intact distal pulses.  Exam reveals no gallop and no friction rub.   No murmur heard. Pulmonary/Chest: Effort normal and breath sounds normal. No respiratory distress. She has no wheezes. She has no rales. She exhibits no tenderness.  Abdominal: Soft. Bowel sounds are normal. She exhibits no distension and no mass. There is no tenderness. There is no rebound and no guarding.  Musculoskeletal: Normal range of motion. She exhibits no edema or tenderness.  Lymphadenopathy:    She has no cervical adenopathy.  Neurological: She is alert and oriented to person, place, and time. She has normal reflexes. No cranial nerve deficit. She exhibits normal muscle tone. Coordination normal.  Skin: Skin is warm and dry. No rash noted. She is not diaphoretic. No erythema. No pallor.  Psychiatric: She has a normal mood and affect. Her behavior is normal. Judgment and thought content normal.  Flat affect  Vitals reviewed.  Vitals:   09/22/16 1141  BP: 120/84  Pulse: 70  SpO2: 97%  Weight: (!) 303 lb 6.4 oz (137.6 kg)  Height: 5\' 9"  (1.753 m)   Estimated body mass index is 44.8 kg/m as calculated from the following:   Height as of this encounter: 5\' 9"  (1.753 m).   Weight as of this encounter: 303 lb 6.4 oz (137.6 kg).       Assessment:        ICD-9-CM ICD-10-CM   1. Dyspnea and respiratory abnormality 786.09 R06.00     R06.89   2. Pulmonary infiltrates 793.19 R91.8   3. Abnormal PFTs (pulmonary function tests) 794.2 R94.2          Plan:      Based on normal l nitric actic oxide levels I do not think she has asthma. Thereforeo  use Proventil as needed for not committing her to inhaled steroid. On the other  hand walking desaturation test showed transient mild desaturation which is classic for obesity related atelectasis and associated dyspnea. The treatment for this is really weight loss. But given the fact she has had previous pulmonary infiltrate and she desaturated with walking will get a high-resolution CT chest.  Meanwhile she is to start her self on CPAP next week when the patient arrives  sHe will then return to see me and a sleep physician in the office the next few months   She is agreeable with the plan  > 50% of this > 25 min visit spent in face to face counseling or coordination of care    Dr. Brand Males, M.D., [facility].C.P Pulmonary and Critical Care Medicine Staff Physician [facility] Pager: [phone], If no answer or between  15:00h - 7:00h: call [phone]  09/22/2016 12:26 PM

## 2016-09-27 ENCOUNTER — Telehealth (HOSPITAL_COMMUNITY): Payer: Self-pay | Admitting: *Deleted

## 2016-09-27 ENCOUNTER — Ambulatory Visit (INDEPENDENT_AMBULATORY_CARE_PROVIDER_SITE_OTHER): Payer: BLUE CROSS/BLUE SHIELD | Admitting: Psychiatry

## 2016-09-27 ENCOUNTER — Encounter (HOSPITAL_COMMUNITY): Payer: Self-pay | Admitting: Psychiatry

## 2016-09-27 VITALS — BP 138/90 | HR 68 | Ht 69.0 in | Wt 303.0 lb

## 2016-09-27 DIAGNOSIS — G4733 Obstructive sleep apnea (adult) (pediatric): Secondary | ICD-10-CM | POA: Diagnosis not present

## 2016-09-27 DIAGNOSIS — N183 Chronic kidney disease, stage 3 (moderate): Secondary | ICD-10-CM | POA: Diagnosis not present

## 2016-09-27 DIAGNOSIS — Z818 Family history of other mental and behavioral disorders: Secondary | ICD-10-CM

## 2016-09-27 DIAGNOSIS — Z79899 Other long term (current) drug therapy: Secondary | ICD-10-CM

## 2016-09-27 DIAGNOSIS — Z794 Long term (current) use of insulin: Secondary | ICD-10-CM | POA: Diagnosis not present

## 2016-09-27 DIAGNOSIS — F3181 Bipolar II disorder: Secondary | ICD-10-CM

## 2016-09-27 DIAGNOSIS — K589 Irritable bowel syndrome without diarrhea: Secondary | ICD-10-CM

## 2016-09-27 DIAGNOSIS — M549 Dorsalgia, unspecified: Secondary | ICD-10-CM | POA: Diagnosis not present

## 2016-09-27 DIAGNOSIS — Z7982 Long term (current) use of aspirin: Secondary | ICD-10-CM

## 2016-09-27 DIAGNOSIS — J45909 Unspecified asthma, uncomplicated: Secondary | ICD-10-CM | POA: Diagnosis not present

## 2016-09-27 DIAGNOSIS — Z87891 Personal history of nicotine dependence: Secondary | ICD-10-CM | POA: Diagnosis not present

## 2016-09-27 DIAGNOSIS — E1022 Type 1 diabetes mellitus with diabetic chronic kidney disease: Secondary | ICD-10-CM | POA: Diagnosis not present

## 2016-09-27 DIAGNOSIS — K219 Gastro-esophageal reflux disease without esophagitis: Secondary | ICD-10-CM

## 2016-09-27 MED ORDER — LAMOTRIGINE 150 MG PO TABS: 150.0000 mg | ORAL_TABLET | Freq: Every day | ORAL | 1 refills | Status: DC

## 2016-09-27 MED ORDER — DULOXETINE HCL 60 MG PO CPEP
60.0000 mg | ORAL_CAPSULE | Freq: Two times a day (BID) | ORAL | 1 refills | Status: DC
Start: 2016-09-27 — End: 2016-10-25

## 2016-09-27 MED ORDER — LAMOTRIGINE 200 MG PO TABS: 200.0000 mg | ORAL_TABLET | Freq: Every day | ORAL | 1 refills | Status: DC

## 2016-09-27 NOTE — Telephone Encounter (Signed)
Called pt pharmacy and spoke with [facility] and informed her with what provider stated. She verbalized understanding.

## 2016-09-27 NOTE — Patient Instructions (Addendum)
1. Increase lamotrigine 200 mg daily 2  Continue duloxetine 60 mg twice a day 3. Return to clinic in one month

## 2016-09-27 NOTE — Telephone Encounter (Signed)
phone call from [facility].  They got two scrips from Dr. Modesta Messing, Lamictal 150 mg and Lamictal 200 mg.  They need to know if you want the patient on both?

## 2016-09-27 NOTE — Telephone Encounter (Signed)
Only on 200 mg daily

## 2016-10-04 ENCOUNTER — Ambulatory Visit (HOSPITAL_COMMUNITY)
Admission: RE | Admit: 2016-10-04 | Discharge: 2016-10-04 | Disposition: A | Discharge status: Home / Self Care | Payer: BLUE CROSS/BLUE SHIELD | Source: Ambulatory Visit | Attending: Internal Medicine | Admitting: Internal Medicine

## 2016-10-04 DIAGNOSIS — R0600 Dyspnea, unspecified: Secondary | ICD-10-CM | POA: Insufficient documentation

## 2016-10-04 DIAGNOSIS — R918 Other nonspecific abnormal finding of lung field: Secondary | ICD-10-CM | POA: Diagnosis present

## 2016-10-04 DIAGNOSIS — R0689 Other abnormalities of breathing: Secondary | ICD-10-CM

## 2016-10-13 ENCOUNTER — Telehealth: Payer: Self-pay | Admitting: Internal Medicine

## 2016-10-13 DIAGNOSIS — R911 Solitary pulmonary nodule: Secondary | ICD-10-CM

## 2016-10-13 NOTE — Telephone Encounter (Signed)
Jasmine [name]  CT chest 10/04/16 - please give results and action items in red    CT chest 10/04/16 1. No evidence of interstitial lung disease. - > no ild.  2. Moderate patchy air trapping, indicating small airways disease . 3. Stable mild postinfectious/postinflammatory scarring in the right lung. 4. Scattered small solid pulmonary nodules, the largest of which measuring 6 mm in the right middle lobe is slightly increased in size since 10/03/2013 chest CT. Non-contrast chest CT at 3-6 months is recommended. If the nodules are stable at time of repeat CT, then future CT at 18-24 months (from today's scan) is considered optional for low-risk patients, but is recommended for high-risk patients. This recommendation follows the consensus statement: Guidelines for Management of Incidental Pulmonary Nodules Detected on CT Images: From the Fleischner Society 2017; Radiology 2017; 284:228-243. -> she has lung nodule that hsa grown since 2015 but still very small. PLAN - repeat CT chest wo contrast in sept 2018 and then come to see me for followup (cancel OV with me June 2018)  Lats visit I wanted her referred to a sleep doc in our office - please do that    Electronically Signed By: Janina Mayo.D.

## 2016-10-15 NOTE — Telephone Encounter (Signed)
Called and spoke to pt. Informed her of the results per MR. Order placed and appt made with MR in 02/2017 and Dr. Elsworth Soho for sleep in 12/2016. Pt verbalized understanding and denied any further questions or concerns at this time.
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
## 2016-10-25 ENCOUNTER — Ambulatory Visit (INDEPENDENT_AMBULATORY_CARE_PROVIDER_SITE_OTHER): Payer: BLUE CROSS/BLUE SHIELD | Admitting: Psychiatry

## 2016-10-25 VITALS — BP 119/72 | HR 73 | Ht 69.02 in | Wt 302.2 lb

## 2016-10-25 DIAGNOSIS — Z801 Family history of malignant neoplasm of trachea, bronchus and lung: Secondary | ICD-10-CM

## 2016-10-25 DIAGNOSIS — K589 Irritable bowel syndrome without diarrhea: Secondary | ICD-10-CM

## 2016-10-25 DIAGNOSIS — K219 Gastro-esophageal reflux disease without esophagitis: Secondary | ICD-10-CM | POA: Diagnosis not present

## 2016-10-25 DIAGNOSIS — Z79899 Other long term (current) drug therapy: Secondary | ICD-10-CM

## 2016-10-25 DIAGNOSIS — Z87891 Personal history of nicotine dependence: Secondary | ICD-10-CM

## 2016-10-25 DIAGNOSIS — Z8052 Family history of malignant neoplasm of bladder: Secondary | ICD-10-CM

## 2016-10-25 DIAGNOSIS — G8929 Other chronic pain: Secondary | ICD-10-CM | POA: Diagnosis not present

## 2016-10-25 DIAGNOSIS — M549 Dorsalgia, unspecified: Secondary | ICD-10-CM | POA: Diagnosis not present

## 2016-10-25 DIAGNOSIS — N183 Chronic kidney disease, stage 3 (moderate): Secondary | ICD-10-CM

## 2016-10-25 DIAGNOSIS — Z8249 Family history of ischemic heart disease and other diseases of the circulatory system: Secondary | ICD-10-CM

## 2016-10-25 DIAGNOSIS — G4733 Obstructive sleep apnea (adult) (pediatric): Secondary | ICD-10-CM | POA: Diagnosis not present

## 2016-10-25 DIAGNOSIS — Z803 Family history of malignant neoplasm of breast: Secondary | ICD-10-CM

## 2016-10-25 DIAGNOSIS — Z881 Allergy status to other antibiotic agents status: Secondary | ICD-10-CM

## 2016-10-25 DIAGNOSIS — Z818 Family history of other mental and behavioral disorders: Secondary | ICD-10-CM

## 2016-10-25 DIAGNOSIS — Z7982 Long term (current) use of aspirin: Secondary | ICD-10-CM

## 2016-10-25 DIAGNOSIS — F3181 Bipolar II disorder: Secondary | ICD-10-CM | POA: Diagnosis not present

## 2016-10-25 DIAGNOSIS — E1022 Type 1 diabetes mellitus with diabetic chronic kidney disease: Secondary | ICD-10-CM

## 2016-10-25 DIAGNOSIS — J45909 Unspecified asthma, uncomplicated: Secondary | ICD-10-CM | POA: Diagnosis not present

## 2016-10-25 DIAGNOSIS — Z915 Personal history of self-harm: Secondary | ICD-10-CM

## 2016-10-25 DIAGNOSIS — Z794 Long term (current) use of insulin: Secondary | ICD-10-CM

## 2016-10-25 DIAGNOSIS — Z888 Allergy status to other drugs, medicaments and biological substances status: Secondary | ICD-10-CM

## 2016-10-25 DIAGNOSIS — Z825 Family history of asthma and other chronic lower respiratory diseases: Secondary | ICD-10-CM

## 2016-10-25 MED ORDER — LAMOTRIGINE 200 MG PO TABS: 200.0000 mg | ORAL_TABLET | Freq: Every day | ORAL | 1 refills | Status: DC

## 2016-10-25 MED ORDER — DULOXETINE HCL 60 MG PO CPEP: 60.0000 mg | ORAL_CAPSULE | Freq: Two times a day (BID) | ORAL | 1 refills | Status: DC

## 2016-10-25 NOTE — Patient Instructions (Addendum)
1. Continue lamotrigine 200 mg daily 2  Continue duloxetine 60 mg twice a day 3. Return to clinic in one month

## 2016-11-09 ENCOUNTER — Ambulatory Visit: Payer: Self-pay | Admitting: Internal Medicine
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
## 2016-11-18 ENCOUNTER — Ambulatory Visit: Payer: BLUE CROSS/BLUE SHIELD | Admitting: Physical Therapy

## 2016-11-23 ENCOUNTER — Ambulatory Visit (INDEPENDENT_AMBULATORY_CARE_PROVIDER_SITE_OTHER): Payer: BLUE CROSS/BLUE SHIELD | Admitting: Psychiatry

## 2016-11-23 ENCOUNTER — Encounter (HOSPITAL_COMMUNITY): Payer: Self-pay | Admitting: Psychiatry

## 2016-11-23 VITALS — BP 144/94 | HR 72 | Ht 69.02 in | Wt 304.0 lb

## 2016-11-23 DIAGNOSIS — K219 Gastro-esophageal reflux disease without esophagitis: Secondary | ICD-10-CM | POA: Diagnosis not present

## 2016-11-23 DIAGNOSIS — J45909 Unspecified asthma, uncomplicated: Secondary | ICD-10-CM

## 2016-11-23 DIAGNOSIS — Z79899 Other long term (current) drug therapy: Secondary | ICD-10-CM

## 2016-11-23 DIAGNOSIS — N183 Chronic kidney disease, stage 3 (moderate): Secondary | ICD-10-CM

## 2016-11-23 DIAGNOSIS — Z888 Allergy status to other drugs, medicaments and biological substances status: Secondary | ICD-10-CM

## 2016-11-23 DIAGNOSIS — Z818 Family history of other mental and behavioral disorders: Secondary | ICD-10-CM

## 2016-11-23 DIAGNOSIS — M549 Dorsalgia, unspecified: Secondary | ICD-10-CM

## 2016-11-23 DIAGNOSIS — Z881 Allergy status to other antibiotic agents status: Secondary | ICD-10-CM

## 2016-11-23 DIAGNOSIS — E1022 Type 1 diabetes mellitus with diabetic chronic kidney disease: Secondary | ICD-10-CM | POA: Diagnosis not present

## 2016-11-23 DIAGNOSIS — G4733 Obstructive sleep apnea (adult) (pediatric): Secondary | ICD-10-CM

## 2016-11-23 DIAGNOSIS — G8929 Other chronic pain: Secondary | ICD-10-CM | POA: Diagnosis not present

## 2016-11-23 DIAGNOSIS — F3181 Bipolar II disorder: Secondary | ICD-10-CM

## 2016-11-23 DIAGNOSIS — Z794 Long term (current) use of insulin: Secondary | ICD-10-CM | POA: Diagnosis not present

## 2016-11-23 DIAGNOSIS — K589 Irritable bowel syndrome without diarrhea: Secondary | ICD-10-CM

## 2016-11-23 DIAGNOSIS — Z7982 Long term (current) use of aspirin: Secondary | ICD-10-CM

## 2016-11-23 DIAGNOSIS — Z87891 Personal history of nicotine dependence: Secondary | ICD-10-CM

## 2016-11-23 MED ORDER — DULOXETINE HCL 60 MG PO CPEP
60.0000 mg | ORAL_CAPSULE | Freq: Two times a day (BID) | ORAL | 1 refills | Status: DC
Start: 2016-11-23 — End: 2016-12-20

## 2016-11-23 MED ORDER — LAMOTRIGINE 200 MG PO TABS: 200.0000 mg | ORAL_TABLET | Freq: Every day | ORAL | 1 refills | Status: DC

## 2016-11-23 MED ORDER — ARIPIPRAZOLE 2 MG PO TABS: 2.0000 mg | ORAL_TABLET | Freq: Every day | ORAL | 1 refills | Status: DC

## 2016-11-23 NOTE — Patient Instructions (Signed)
1. Continue lamotrigine 200 mg daily 2  Continue duloxetine 60 mg twice a day 3. Start Abilify 2 mg daily 4. Return to clinic in one month

## 2016-11-29 ENCOUNTER — Ambulatory Visit: Payer: BLUE CROSS/BLUE SHIELD | Attending: Internal Medicine | Admitting: Physical Therapy

## 2016-11-29 ENCOUNTER — Encounter: Payer: Self-pay | Admitting: Physical Therapy

## 2016-11-29 DIAGNOSIS — M6281 Muscle weakness (generalized): Secondary | ICD-10-CM | POA: Diagnosis present

## 2016-11-29 DIAGNOSIS — M5442 Lumbago with sciatica, left side: Secondary | ICD-10-CM | POA: Diagnosis present

## 2016-11-29 DIAGNOSIS — R293 Abnormal posture: Secondary | ICD-10-CM | POA: Insufficient documentation

## 2016-11-29 DIAGNOSIS — M5416 Radiculopathy, lumbar region: Secondary | ICD-10-CM | POA: Diagnosis not present

## 2016-11-29 NOTE — Patient Instructions (Signed)
[facility]  DRY NEEDLING CONSENT FORM   Trigger point dry needling is a physical therapy approach to treat Myofascial Pain and Dysfunction.  Dry Needling (DN) is a valuable and effective way to deactivate myofascial trigger points (muscle knots). It is skilled intervention that uses a thin filiform needle to penetrate the skin and stimulate underlying myofascial trigger points, muscular, and connective tissues for the management of neuromusculoskeletal pain and movement impairments.  A local twitch response (LTR) will be elicited.  This can sometimes feel like a deep ache in the muscle during the procedure. Multiple trigger points in multiple muscles can be treated during each treatment.  No medication of any kind is injected.   As with any medical treatment and procedure, there are possible adverse events.  While significant adverse events are uncommon, they do sometimes occur and must be considered prior to giving consent.  1. Dry needling often causes a "post needling soreness".  There can be an increase in pain from a couple of hours to 2-3 days, followed by an improvement in the overall pain state. 2. Any time a needle is used there is a risk of infection.  However, we are using new, sterile, and disposable needles; infections are extremely rare. 3. There is a possibility that you may bleed or bruise.  You may feel tired and some nausea following treatment. 4. There is a rare possibility of a pneumothorax (air in the chest cavity). 5. Allergic reaction to nickel in the stainless steel needle. 6. If a nerve is touched, it may cause paresthesia (a prickling/shock sensation) which is usually brief, but may continue for a couple of days.  Following treatment stay hydrated.  Continue regular activities but not too vigorous initially after treatment for 24-48 hours.  Dry Needling is best when combined with other physical therapy interventions such as strengthening,  stretching and other therapeutic modalities.     PLEASE ANSWER THE FOLLOWING QUESTIONS:  Do you have a lack of sensation?   Y/N  Do you have a phobia or fear of needles  Y/N  Are you pregnant?    Y/N If yes:  How many weeks? _____  Do you have any implanted devices?  Y/N If yes:  Pacemaker/Spinal Cord         Stimulator/Deep Brain         Stimulator/Insulin          Pump/Other: ____________ Do you have any implants?   Y/N If yes:      Do you take any blood thinners?   Y/N If yes: Coumadin          (Warfarin)/Other:  Do you have a bleeding disorder?   Y/N If yes: What kind:   Do you take any immunosuppressants?  Y/N If yes:   What kind:   Do you take anti-inflammatories?   Y/N If yes: What kind:  Have you ever been diagnosed with Scoliosis? Y/N  Have you had back surgery?    Y/N If yes:         Laminectomy/Fusion/Other:   I have read, or had read to me, the above.  I have had the opportunity to ask any questions.  All of my questions have been answered to my satisfaction and I understand the risks involved with dry needling.  I consent to examination and treatment at [facility], including dry needling, of any and all of my involved and affected muscles.   Madelyn Flavors, PT 11/29/16  1:45 PM [facility] [place], [place], [address] Phone: [phone]   Fax:  [phone]

## 2016-11-29 NOTE — Therapy (Signed)
[facility] [place], [place], [address] Phone: [phone]   Fax:  [phone]  Physical Therapy Evaluation  Patient Details  Name: Jasmine [name] MRN: [id number] Date of Birth: [birth year] Referring Provider: Marella Chimes PA-C  Encounter Date: 11/29/2016      PT End of Session - 11/29/16 1305    Visit Number 1   Number of Visits 16   Date for PT Re-Evaluation 01/24/17   PT Start Time 0938   PT Stop Time 1358   PT Time Calculation (min) 53 min   Activity Tolerance Patient tolerated treatment well   Behavior During Therapy [facility] for tasks assessed/performed      Past Medical History:  Diagnosis Date  . Anxiety   . Bipolar disorder ([place])   . CKD (chronic kidney disease), stage II   . Depression   . Diabetes mellitus   . DKA, type 1 ([place]) 11/04/2011  . Elevated cholesterol   . GERD (gastroesophageal reflux disease)   . Hypertension   . Hypothyroidism   . IBS (irritable bowel syndrome)   . Obesity   . Sleep apnea    HAS C -PAP / DOES NOT USE  . Stress incontinence   . Tobacco abuse   . UTI (lower urinary tract infection)     Past Surgical History:  Procedure Laterality Date  . INCONTINENCE SURGERY    . NASAL FRACTURE SURGERY    . ovary removed    . PUBOVAGINAL SLING  08/16/2011   Procedure: [name];  Surgeon: Bernestine Amass, MD;  Location: [facility];  Service: Urology;  Laterality: N/A;         . UTERINE FIBROID SURGERY  2001    There were no vitals filed for this visit.       Subjective Assessment - 11/29/16 1309    Subjective Patient states she was doing pretty well and then started lifting things again and doing overhead activities and the pinching pain returned about a month ago. Her stabbing pain is in the mid back and left side and the lesser pain is in the low back.   Pertinent History HTN, DM, taccycardia   How long can you stand comfortably? 10 min   Diagnostic tests none   Patient Stated Goals be able to stand longer, decrease pain   Currently in Pain? Yes   Pain Score 2    Pain Location Back   Pain Orientation Left;Mid   Pain Descriptors / Indicators Stabbing   Pain Type Chronic pain   Pain Onset 1 to 4 weeks ago   Pain Frequency Constant   Aggravating Factors  lifting (depending on weight of object), stretching   Pain Relieving Factors bending forward            [facility] PT Assessment - 11/29/16 0001      Assessment   Medical Diagnosis Chronic LBP with radiculopathy   Referring Provider Marella Chimes PA-C   Onset Date/Surgical Date 11/01/16   Next MD Visit Sept 2018     Precautions   Precautions Other (comment)   Precaution Comments Insulin pump; blood sugar monitor on R biceps; Paper Tape Allergy     Restrictions   Weight Bearing Restrictions No     Balance Screen   Has the patient fallen in the past 6 months No   Has the patient had a decrease in activity level because of a fear of falling?  No   Is the patient reluctant to leave their  home because of a fear of falling?  No     Prior Function   Level of Independence Independent   Vocation On disability     Observation/Other Assessments   Focus on Therapeutic Outcomes (FOTO)  60% limited   Other Surveys  --  BLE reflexes equal and minimal     Posture/Postural Control   Posture/Postural Control Postural limitations   Posture Comments tight L QL     ROM / Strength   AROM / PROM / Strength AROM;Strength     AROM   Overall AROM Comments Lumbar ROM WNL except L SB decreased 25%     Strength   Overall Strength Comments BLE 5/5 except L knee flex 4-/5, L hip flex 4/5, L hip ext 3+/5 with pain     Flexibility   Soft Tissue Assessment /Muscle Length yes   Hamstrings B R>L   Piriformis R  also R gluteals     Palpation   Spinal mobility WNL Lumbar PA B   Palpation comment unremarkable     Special Tests    Special Tests Lumbar   Lumbar Tests Slump Test     Slump test   Findings  Positive   Side Left   Comment Negative on R            Objective measurements completed on examination: See above findings.          [facility] Adult PT Treatment/Exercise - 11/29/16 0001      Modalities   Modalities Electrical Stimulation;Moist Heat     Moist Heat Therapy   Number Minutes Moist Heat 15 Minutes   Moist Heat Location Lumbar Spine     Electrical Stimulation   Electrical Stimulation Location L lumbar premod 80-150 Hz x 15 min   Electrical Stimulation Goals Pain          Trigger Point Dry Needling - 11/29/16 1501    Consent Given? Yes   Education Handout Provided Yes   Muscles Treated Upper Body Quadratus Lumborum  L; no twitch response elicited              PT Education - 11/29/16 1501    Education provided Yes   Education Details DN education and aftercare reviewed with patient; verbal consent given.   Person(s) Educated Patient   Methods Explanation;Demonstration;Handout   Comprehension Verbalized understanding             PT Long Term Goals - 11/29/16 1512      PT LONG TERM GOAL #1   Title patient to be independent with HEP 12/27/16   Time 8   Period Weeks   Status New     PT LONG TERM GOAL #2   Title Patient to improve L hip flex/ext strength to >/= 4+/5    Time 8   Period Weeks   Status New     PT LONG TERM GOAL #3   Title Patient able to perform ADLS with 2-5/85 pain or less.   Time 8   Period Weeks   Status New     PT LONG TERM GOAL #4   Title Patient to report ability to stand/walk for > 30 minutes with no increase in pain (07/13/16)   Time 8   Period Weeks   Status New     PT LONG TERM GOAL #5   Title Patient to demonstrate proper postural alignment with good core engagement   Time 8   Period Weeks   Status New  Plan - 11/29/16 1502    Clinical Impression Statement Patient presents today for low complexity evaluation for c/o L sided low back pain with radiculopathy. Patient has been  seen previously in the clinic for the same complaints and responded well to treatment including DN. She stands with lateral trunk lean left indicating tight L QL, however no twitch response was elicited with TPDN today. She also has marked tightness in her R hip muscles including gluts, piriformis and HS as well as her L HS. She has increased tone in R paraspinals vs left, but weakness in L knee and hip flex. She will benefit from skilled PT to address these deficits.   History and Personal Factors relevant to plan of care: B hip bursitis, neuropathy, tacchycardia   Clinical Presentation Evolving   Clinical Presentation due to: worsening symptoms   Clinical Decision Making Low   Rehab Potential Good   PT Frequency 2x / week   PT Duration 8 weeks   PT Treatment/Interventions ADLs/Self Care Home Management;Electrical Stimulation;Moist Heat;Traction;Ultrasound;Therapeutic activities;Therapeutic exercise;Neuromuscular re-education;Manual techniques;Dry needling;Patient/family education;Taping   PT Next Visit Plan Issue HEP (hip and HS stretches; check ITBs); DN as needed; nerve glides LLE;  traction at 40% body weight possibly.   Consulted and Agree with Plan of Care Patient      Patient will benefit from skilled therapeutic intervention in order to improve the following deficits and impairments:  Pain, Decreased strength, Postural dysfunction, Impaired flexibility, Decreased range of motion  Visit Diagnosis: Radiculopathy, lumbar region - Plan: PT plan of care cert/re-cert  Left-sided low back pain with left-sided sciatica, unspecified chronicity - Plan: PT plan of care cert/re-cert  Abnormal posture - Plan: PT plan of care cert/re-cert  Muscle weakness (generalized) - Plan: PT plan of care cert/re-cert      G-Codes - 76/16/07 1303    Functional Assessment Tool Used (Outpatient Only) FOTO 60% LIMITED   Functional Limitation Other PT primary   Other PT Primary Current Status (P7106) At  least 60 percent but less than 80 percent impaired, limited or restricted   Other PT Primary Goal Status (Y6948) At least 40 percent but less than 60 percent impaired, limited or restricted       Problem List Patient Active Problem List   Diagnosis Date Noted  . OSA (obstructive sleep apnea) 07/27/2016  . Wheezing 07/27/2016  . Hyperglycemia 12/25/2012  . Acute on chronic renal failure ([place]) 12/25/2012  . Pulmonary infiltrates 10/02/2012  . Postnasal drip 10/02/2012  . Hyperkalemia 09/25/2012  . Chronic kidney disease, stage III (moderate) 09/25/2012  . Morbid obesity ([place]) 09/24/2012  . HTN (hypertension) 09/24/2012  . Bipolar II disorder ([place]) 09/24/2012  . URI (upper respiratory infection) 09/24/2012  . DKA, type 1 ([place]) 11/04/2011  . Gastroenteritis 11/03/2011  . DM (diabetes mellitus), type 1, uncontrolled ([place]) 11/03/2011  . Hyponatremia 11/03/2011  . CKD (chronic kidney disease), stage II 11/03/2011  . Elevated lipase 11/03/2011  . Hypothyroidism 11/03/2011  . Tobacco abuse 11/03/2011  . SUI (stress urinary incontinence, female) 08/16/2011    Madelyn Flavors PT 11/29/2016, 3:20 PM  [facility] [address] [place], [place], [address] Phone: [phone]   Fax:  [phone]  Name: Jasmine [name] MRN: [id number] Date of Birth: 04-Jul-1970

## 2016-12-06 ENCOUNTER — Encounter: Payer: Self-pay | Admitting: Physical Therapy

## 2016-12-07 ENCOUNTER — Encounter: Payer: Self-pay | Admitting: Physical Therapy
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
## 2016-12-20 ENCOUNTER — Encounter (HOSPITAL_COMMUNITY): Payer: Self-pay | Admitting: Psychiatry

## 2016-12-20 ENCOUNTER — Ambulatory Visit (INDEPENDENT_AMBULATORY_CARE_PROVIDER_SITE_OTHER): Payer: BLUE CROSS/BLUE SHIELD | Admitting: Psychiatry

## 2016-12-20 VITALS — BP 156/94 | HR 74 | Ht 69.0 in | Wt 311.0 lb

## 2016-12-20 DIAGNOSIS — E1022 Type 1 diabetes mellitus with diabetic chronic kidney disease: Secondary | ICD-10-CM

## 2016-12-20 DIAGNOSIS — M549 Dorsalgia, unspecified: Secondary | ICD-10-CM

## 2016-12-20 DIAGNOSIS — R443 Hallucinations, unspecified: Secondary | ICD-10-CM

## 2016-12-20 DIAGNOSIS — J45909 Unspecified asthma, uncomplicated: Secondary | ICD-10-CM

## 2016-12-20 DIAGNOSIS — Z818 Family history of other mental and behavioral disorders: Secondary | ICD-10-CM | POA: Diagnosis not present

## 2016-12-20 DIAGNOSIS — Z87891 Personal history of nicotine dependence: Secondary | ICD-10-CM

## 2016-12-20 DIAGNOSIS — K219 Gastro-esophageal reflux disease without esophagitis: Secondary | ICD-10-CM

## 2016-12-20 DIAGNOSIS — G4733 Obstructive sleep apnea (adult) (pediatric): Secondary | ICD-10-CM | POA: Diagnosis not present

## 2016-12-20 DIAGNOSIS — G4700 Insomnia, unspecified: Secondary | ICD-10-CM | POA: Diagnosis not present

## 2016-12-20 DIAGNOSIS — F3181 Bipolar II disorder: Secondary | ICD-10-CM | POA: Diagnosis not present

## 2016-12-20 DIAGNOSIS — K589 Irritable bowel syndrome without diarrhea: Secondary | ICD-10-CM

## 2016-12-20 DIAGNOSIS — N183 Chronic kidney disease, stage 3 (moderate): Secondary | ICD-10-CM | POA: Diagnosis not present

## 2016-12-20 MED ORDER — ZOLPIDEM TARTRATE 5 MG PO TABS: ORAL_TABLET | ORAL | 0 refills | Status: DC

## 2016-12-20 MED ORDER — DULOXETINE HCL 60 MG PO CPEP: 60.0000 mg | ORAL_CAPSULE | Freq: Two times a day (BID) | ORAL | 1 refills | Status: DC

## 2016-12-20 MED ORDER — LAMOTRIGINE 200 MG PO TABS: 200.0000 mg | ORAL_TABLET | Freq: Every day | ORAL | 1 refills | Status: DC

## 2016-12-20 MED ORDER — ARIPIPRAZOLE 2 MG PO TABS: 2.0000 mg | ORAL_TABLET | Freq: Every day | ORAL | 1 refills | Status: DC

## 2016-12-20 NOTE — Patient Instructions (Signed)
1. Continue lamotrigine 200 mg daily 2. Continue duloxetine 60 mg twice a day 3. Continue Abilify 2 mg daily 4 . Start Ambien 2.5-5 mg at night as needed for sleep 5. Return to clinic in one month

## 2017-01-04 ENCOUNTER — Ambulatory Visit (INDEPENDENT_AMBULATORY_CARE_PROVIDER_SITE_OTHER): Payer: BLUE CROSS/BLUE SHIELD | Admitting: Pulmonary Disease

## 2017-01-04 ENCOUNTER — Encounter: Payer: Self-pay | Admitting: Pulmonary Disease

## 2017-01-04 VITALS — BP 130/80 | HR 71 | Ht 69.0 in | Wt 310.2 lb

## 2017-01-04 DIAGNOSIS — G4733 Obstructive sleep apnea (adult) (pediatric): Secondary | ICD-10-CM

## 2017-01-04 NOTE — Patient Instructions (Signed)
Prescription will be sent to DME for Auto CPAP 5-15 cm, fullface mask of choice, humidity, download in 4 weeks  Expectation is that she will use the machine at least 4-6 hours every night

## 2017-01-04 NOTE — Assessment & Plan Note (Signed)
Prescription will be sent to DME for Auto CPAP 5-15 cm, fullface mask of choice, humidity, download in 4 weeks  Expectation is that she will use the machine at least 4-6 hours every night  She will need a follow-up nocturnal oximetry to ensure oxygen levels corrected with CPAP  The pathophysiology of obstructive sleep apnea , it's cardiovascular consequences & modes of treatment including CPAP were discused with the patient in detail & they evidenced understanding.

## 2017-01-04 NOTE — Progress Notes (Signed)
Subjective:    Patient ID: Jasmine [name], female    DOB: 1970/07/08, 46 y.o.   MRN: [id number]  HPI  Chief Complaint  Patient presents with  . Advice Only    Pt had a home sleep study 08/17/16 and was supposed to have received a CPAP machine but was being charged for machine and couldn't afford it so now she is back for another consult.     47 year old obese type I diabetic with bipolar disorder referred for evaluation of sleep-disordered breathing. She reports a diagnosis of OSA in 2010 and she uses CPAP for about 3 years and then stopped using it for unclear reasons. She now reports loud snoring and witnessed apneas by family especially worse when she lies on her back. She reports excessive daytime somnolence. Epworth sleepiness score is 10 and she report sleepiness as a passenger in a car or lying down to rest in the afternoons. Bedtime is between midnight and 3 AM, she frequently takes Ambien 5 mg at bedtime, with which sleep latency is decreased. Her husband works the second shift and therefore the late bedtime. She sleeps on her back with one to 3 pillows and reports to 3 nocturnal awakenings including nocturia and is out of bed by 11 AM feeling tired with occasional dryness of mouth and headaches. She's gained about 15 pounds in the last 2 years.  Home sleep test 07/2016 showed moderate OSA with AHI 25/hour especially worse when supine with AHI 35/hour and lowest desaturation of 65%  Medication review shows Coreg for SVTs, insulin pump, Lamictal and Abilify for bipolar disorder. She is undergoing evaluation for dyspnea by my partner with normal at high resolution CT and PFTs   Past Medical History:  Diagnosis Date  . Anxiety   . Bipolar disorder ([place])   . CKD (chronic kidney disease), stage II   . Depression   . Diabetes mellitus   . DKA, type 1 ([place]) 11/04/2011  . Elevated cholesterol   . GERD (gastroesophageal reflux disease)   . Hypertension   . Hypothyroidism   . IBS  (irritable bowel syndrome)   . Obesity   . Sleep apnea    HAS C -PAP / DOES NOT USE  . Stress incontinence    Pt had surgery to correct this.  . Tobacco abuse   . UTI (lower urinary tract infection)      Past Surgical History:  Procedure Laterality Date  . INCONTINENCE SURGERY    . NASAL FRACTURE SURGERY    . ovary removed    . PUBOVAGINAL SLING  08/16/2011   Procedure: [name];  Surgeon: Bernestine Amass, MD;  Location: [facility];  Service: Urology;  Laterality: N/A;         . UTERINE FIBROID SURGERY  2001    Allergies  Allergen Reactions  . Ciprofloxacin Swelling    Per pt caused lips swell and nauseous feeling  . Buspar [Buspirone] Other (See Comments)    abd cramping  . Advair Diskus [Fluticasone-Salmeterol] Other (See Comments)    Thrush   . Levaquin [Levofloxacin]     Per pt caused lips swell and nauseous feeling  . Ondansetron Other (See Comments)    hiccups  . Biaxin [Clarithromycin] Rash  . Hydroxyzine Palpitations    other     Social History   Social History  . Marital status: Married    Spouse name: N/A  . Number of children: N/A  . Years of education: N/A   Occupational  History  . Not on file.   Social History Main Topics  . Smoking status: Former Smoker    Packs/day: 0.75    Years: 20.00    Types: Cigarettes    Quit date: 06/07/2012  . Smokeless tobacco: Never Used  . Alcohol use No     Comment: 05-20-16 per pt no  . Drug use: No     Comment: 05-20-16 per pt no  . Sexual activity: Yes    Birth control/ protection: IUD   Other Topics Concern  . Not on file   Social History Narrative  . No narrative on file      Family History  Problem Relation Age of Onset  . Asthma Mother   . Bipolar disorder Mother   . Heart disease Father   . Lymphoma Father   . Hypertension Father   . Thyroid disease Father   . Hyperlipidemia Father   . Diabetes Father   . Cancer Paternal Grandmother        lung and breast  . Bladder Cancer  Paternal Grandfather   . Suicidality Maternal Grandfather   . Thyroid disease Brother      Review of Systems Positive for shortness of breath with activity, acid heartburn, abdominal pain, headaches, nasal congestion, sneezing, anxiety and depression, joint stiffness  Constitutional: negative for anorexia, fevers and sweats  Eyes: negative for irritation, redness and visual disturbance  Ears, nose, mouth, throat, and face: negative for earaches, epistaxis, nasal congestion and sore throat  Respiratory: negative for cough,sputum and wheezing  Cardiovascular: negative for chest pain,  lower extremity edema, orthopnea, palpitations and syncope  Gastrointestinal: negative for abdominal pain, constipation, diarrhea, melena, nausea and vomiting  Genitourinary:negative for dysuria, frequency and hematuria  Hematologic/lymphatic: negative for bleeding, easy bruising and lymphadenopathy  Musculoskeletal:negative for arthralgias, muscle weakness and stiff joints  Neurological: negative for coordination problems, gait problems and weakness  Endocrine: negative for diabetic symptoms including polydipsia, polyuria and weight loss     Objective:   Physical Exam  Gen. Pleasant, obese, in no distress, normal affect ENT - no lesions, no post nasal drip, class 2 airway Neck: No JVD, no thyromegaly, no carotid bruits Lungs: no use of accessory muscles, no dullness to percussion, decreased without rales or rhonchi  Cardiovascular: Rhythm regular, heart sounds  normal, no murmurs or gallops, no peripheral edema Abdomen: soft and non-tender, no hepatosplenomegaly, BS normal. Musculoskeletal: No deformities, no cyanosis or clubbing Neuro:  alert, non focal, no tremors       Assessment & Plan:

## 2017-01-05 ENCOUNTER — Telehealth: Payer: Self-pay | Admitting: Internal Medicine

## 2017-01-05 NOTE — Telephone Encounter (Signed)
I have faxed order to [facility] & sent message to [facility] asking them to disregard order sent yesterday.  Nothing further needed.
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
## 2017-01-17 ENCOUNTER — Ambulatory Visit (INDEPENDENT_AMBULATORY_CARE_PROVIDER_SITE_OTHER): Payer: BLUE CROSS/BLUE SHIELD | Admitting: Neurology

## 2017-01-17 ENCOUNTER — Encounter: Payer: Self-pay | Admitting: Neurology

## 2017-01-17 VITALS — BP 165/87 | HR 68 | Ht 69.0 in | Wt 308.5 lb

## 2017-01-17 DIAGNOSIS — IMO0002 Reserved for concepts with insufficient information to code with codable children: Secondary | ICD-10-CM

## 2017-01-17 DIAGNOSIS — G43709 Chronic migraine without aura, not intractable, without status migrainosus: Secondary | ICD-10-CM

## 2017-01-17 DIAGNOSIS — E1142 Type 2 diabetes mellitus with diabetic polyneuropathy: Secondary | ICD-10-CM

## 2017-01-17 MED ORDER — RIZATRIPTAN BENZOATE 10 MG PO TBDP: 10.0000 mg | ORAL_TABLET | ORAL | 6 refills | Status: DC | PRN

## 2017-01-17 MED ORDER — ONDANSETRON 4 MG PO TBDP: 4.0000 mg | ORAL_TABLET | Freq: Three times a day (TID) | ORAL | 6 refills | Status: DC | PRN
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
## 2017-01-17 NOTE — Patient Instructions (Signed)
Magnesium oxide 400 mg twice a day Riboflavin  100 mg twice a day 

## 2017-01-18 ENCOUNTER — Ambulatory Visit (INDEPENDENT_AMBULATORY_CARE_PROVIDER_SITE_OTHER): Payer: BLUE CROSS/BLUE SHIELD | Admitting: Psychiatry

## 2017-01-18 ENCOUNTER — Encounter (HOSPITAL_COMMUNITY): Payer: Self-pay | Admitting: Psychiatry

## 2017-01-18 VITALS — BP 137/80 | HR 68 | Ht 69.0 in | Wt 311.4 lb

## 2017-01-18 DIAGNOSIS — Z79899 Other long term (current) drug therapy: Secondary | ICD-10-CM | POA: Diagnosis not present

## 2017-01-18 DIAGNOSIS — Z915 Personal history of self-harm: Secondary | ICD-10-CM | POA: Diagnosis not present

## 2017-01-18 DIAGNOSIS — G8929 Other chronic pain: Secondary | ICD-10-CM | POA: Diagnosis not present

## 2017-01-18 DIAGNOSIS — Z87891 Personal history of nicotine dependence: Secondary | ICD-10-CM

## 2017-01-18 DIAGNOSIS — Z818 Family history of other mental and behavioral disorders: Secondary | ICD-10-CM | POA: Diagnosis not present

## 2017-01-18 DIAGNOSIS — F3181 Bipolar II disorder: Secondary | ICD-10-CM

## 2017-01-18 MED ORDER — DULOXETINE HCL 60 MG PO CPEP: 60.0000 mg | ORAL_CAPSULE | Freq: Two times a day (BID) | ORAL | 1 refills | Status: DC

## 2017-01-18 MED ORDER — LAMOTRIGINE 200 MG PO TABS: 200.0000 mg | ORAL_TABLET | Freq: Every day | ORAL | 1 refills | Status: DC

## 2017-01-18 MED ORDER — ARIPIPRAZOLE 5 MG PO TABS: 5.0000 mg | ORAL_TABLET | Freq: Every day | ORAL | 1 refills | Status: DC

## 2017-01-18 MED ORDER — ZOLPIDEM TARTRATE 5 MG PO TABS: ORAL_TABLET | ORAL | 0 refills | Status: DC

## 2017-01-18 NOTE — Patient Instructions (Signed)
1. Continue lamotrigine 200 mg daily  2. Continue duloxetine 60 mg twice a day 3. Increase abilify 5 mg daily 4. Continue Ambien 2.5-5 mg at night as needed for sleep 5. Return to clinic in one month

## 2017-02-08 ENCOUNTER — Ambulatory Visit (HOSPITAL_COMMUNITY)
Admission: RE | Admit: 2017-02-08 | Discharge: 2017-02-08 | Disposition: A | Discharge status: Home / Self Care | Payer: BLUE CROSS/BLUE SHIELD | Source: Ambulatory Visit | Attending: Internal Medicine | Admitting: Internal Medicine

## 2017-02-08 DIAGNOSIS — R911 Solitary pulmonary nodule: Secondary | ICD-10-CM | POA: Diagnosis present

## 2017-02-08 DIAGNOSIS — R938 Abnormal findings on diagnostic imaging of other specified body structures: Secondary | ICD-10-CM | POA: Diagnosis not present

## 2017-02-08 DIAGNOSIS — R918 Other nonspecific abnormal finding of lung field: Secondary | ICD-10-CM | POA: Diagnosis not present
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
## 2017-02-15 ENCOUNTER — Telehealth (HOSPITAL_COMMUNITY): Payer: Self-pay | Admitting: *Deleted

## 2017-02-15 NOTE — Telephone Encounter (Signed)
noted 

## 2017-02-15 NOTE — Telephone Encounter (Signed)
Office received faxed refill request from pt pharmacy [facility] requesting Zolpidem 5 mg. Pt medication was last filled on 01-18-2017 with 30 tabs and 0 refill. Pt f/u appt is 01-16-2017. Pt pharmacy number is [phone].

## 2017-02-15 NOTE — Telephone Encounter (Signed)
She has enough medication to last until the next appointment. Will hold refill.

## 2017-02-16 ENCOUNTER — Encounter (HOSPITAL_COMMUNITY): Payer: Self-pay | Admitting: Psychiatry

## 2017-02-16 ENCOUNTER — Ambulatory Visit (INDEPENDENT_AMBULATORY_CARE_PROVIDER_SITE_OTHER): Payer: BLUE CROSS/BLUE SHIELD | Admitting: Psychiatry

## 2017-02-16 VITALS — BP 170/85 | HR 72 | Ht 69.0 in | Wt 315.0 lb

## 2017-02-16 DIAGNOSIS — Z87891 Personal history of nicotine dependence: Secondary | ICD-10-CM | POA: Diagnosis not present

## 2017-02-16 DIAGNOSIS — R635 Abnormal weight gain: Secondary | ICD-10-CM

## 2017-02-16 DIAGNOSIS — Z975 Presence of (intrauterine) contraceptive device: Secondary | ICD-10-CM

## 2017-02-16 DIAGNOSIS — M549 Dorsalgia, unspecified: Secondary | ICD-10-CM

## 2017-02-16 DIAGNOSIS — F419 Anxiety disorder, unspecified: Secondary | ICD-10-CM

## 2017-02-16 DIAGNOSIS — F3181 Bipolar II disorder: Secondary | ICD-10-CM | POA: Diagnosis not present

## 2017-02-16 DIAGNOSIS — R5383 Other fatigue: Secondary | ICD-10-CM

## 2017-02-16 DIAGNOSIS — K219 Gastro-esophageal reflux disease without esophagitis: Secondary | ICD-10-CM | POA: Diagnosis not present

## 2017-02-16 DIAGNOSIS — N183 Chronic kidney disease, stage 3 (moderate): Secondary | ICD-10-CM

## 2017-02-16 DIAGNOSIS — K589 Irritable bowel syndrome without diarrhea: Secondary | ICD-10-CM | POA: Diagnosis not present

## 2017-02-16 DIAGNOSIS — R450 Nervousness: Secondary | ICD-10-CM | POA: Diagnosis not present

## 2017-02-16 DIAGNOSIS — G4733 Obstructive sleep apnea (adult) (pediatric): Secondary | ICD-10-CM

## 2017-02-16 DIAGNOSIS — J45909 Unspecified asthma, uncomplicated: Secondary | ICD-10-CM

## 2017-02-16 DIAGNOSIS — Z818 Family history of other mental and behavioral disorders: Secondary | ICD-10-CM

## 2017-02-16 DIAGNOSIS — E1022 Type 1 diabetes mellitus with diabetic chronic kidney disease: Secondary | ICD-10-CM

## 2017-02-16 MED ORDER — ZOLPIDEM TARTRATE 5 MG PO TABS: ORAL_TABLET | ORAL | 0 refills | Status: DC

## 2017-02-16 MED ORDER — DULOXETINE HCL 60 MG PO CPEP: 60.0000 mg | ORAL_CAPSULE | Freq: Two times a day (BID) | ORAL | 1 refills | Status: DC

## 2017-02-16 MED ORDER — LURASIDONE HCL 20 MG PO TABS: 20.0000 mg | ORAL_TABLET | Freq: Every day | ORAL | 1 refills | Status: DC

## 2017-02-16 MED ORDER — LAMOTRIGINE 200 MG PO TABS: 200.0000 mg | ORAL_TABLET | Freq: Every day | ORAL | 1 refills | Status: DC

## 2017-02-16 NOTE — Patient Instructions (Signed)
1. Continue lamotrigine 200 mg daily 2. Continue duloxetine 60 mg twice a day 3. Discontinue abilify  4. Start latuda 20 mg daily (gave 2 week sample) 5. Continue ambien 2.5-5 mg at night needed for sleep 6. Return to clinic in one month

## 2017-02-17 ENCOUNTER — Ambulatory Visit: Payer: Self-pay | Admitting: Pulmonary Disease

## 2017-02-18 ENCOUNTER — Telehealth (HOSPITAL_COMMUNITY): Payer: Self-pay | Admitting: *Deleted

## 2017-02-18 NOTE — Telephone Encounter (Signed)
Pt pharmacy is requesting PA for pt medication. Fax does not have the name of the medication that's needing to have PA. Pharmacy did fax information for Cover my Meds. The Key to access pt PA is [id number], Last name [name] and DOB [birth year]. Pt pharmacy is [facility].

## 2017-02-18 NOTE — Telephone Encounter (Signed)
Office received fax from pt pharmacy [facility] requesting refills for pt Zolpidem 5 mg 0.5 tabs QHS. Per pt chart, pt medication was last filled on 02-16-2017 with 30 tabs 0 refill and printed and given pt pt during her visit with provider. Pt f/u appt is 03-15-2017.

## 2017-02-21 ENCOUNTER — Ambulatory Visit: Payer: Self-pay | Admitting: Internal Medicine

## 2017-02-25 ENCOUNTER — Telehealth (HOSPITAL_COMMUNITY): Payer: Self-pay | Admitting: *Deleted

## 2017-02-25 NOTE — Telephone Encounter (Signed)
Prior authorization for Latuda received. Submitted online with cover my meds. Awaiting decision.  

## 2017-03-01 NOTE — Telephone Encounter (Signed)
noted 

## 2017-03-09 ENCOUNTER — Encounter (HOSPITAL_COMMUNITY): Payer: Self-pay | Admitting: *Deleted

## 2017-03-09 ENCOUNTER — Observation Stay (HOSPITAL_COMMUNITY)
Admission: EM | Admit: 2017-03-09 | Discharge: 2017-03-10 | Disposition: A | Discharge status: Home / Self Care | Payer: BLUE CROSS/BLUE SHIELD | Attending: Internal Medicine | Admitting: Internal Medicine

## 2017-03-09 DIAGNOSIS — K589 Irritable bowel syndrome without diarrhea: Secondary | ICD-10-CM | POA: Diagnosis not present

## 2017-03-09 DIAGNOSIS — I129 Hypertensive chronic kidney disease with stage 1 through stage 4 chronic kidney disease, or unspecified chronic kidney disease: Secondary | ICD-10-CM | POA: Insufficient documentation

## 2017-03-09 DIAGNOSIS — M797 Fibromyalgia: Secondary | ICD-10-CM | POA: Diagnosis not present

## 2017-03-09 DIAGNOSIS — F329 Major depressive disorder, single episode, unspecified: Secondary | ICD-10-CM | POA: Insufficient documentation

## 2017-03-09 DIAGNOSIS — Z794 Long term (current) use of insulin: Secondary | ICD-10-CM | POA: Diagnosis not present

## 2017-03-09 DIAGNOSIS — E785 Hyperlipidemia, unspecified: Secondary | ICD-10-CM | POA: Insufficient documentation

## 2017-03-09 DIAGNOSIS — Z87891 Personal history of nicotine dependence: Secondary | ICD-10-CM | POA: Diagnosis not present

## 2017-03-09 DIAGNOSIS — N183 Chronic kidney disease, stage 3 unspecified: Secondary | ICD-10-CM | POA: Diagnosis present

## 2017-03-09 DIAGNOSIS — E1022 Type 1 diabetes mellitus with diabetic chronic kidney disease: Secondary | ICD-10-CM | POA: Insufficient documentation

## 2017-03-09 DIAGNOSIS — K219 Gastro-esophageal reflux disease without esophagitis: Secondary | ICD-10-CM | POA: Insufficient documentation

## 2017-03-09 DIAGNOSIS — F419 Anxiety disorder, unspecified: Secondary | ICD-10-CM | POA: Insufficient documentation

## 2017-03-09 DIAGNOSIS — R739 Hyperglycemia, unspecified: Secondary | ICD-10-CM | POA: Diagnosis not present

## 2017-03-09 DIAGNOSIS — Z7982 Long term (current) use of aspirin: Secondary | ICD-10-CM | POA: Insufficient documentation

## 2017-03-09 DIAGNOSIS — Z791 Long term (current) use of non-steroidal anti-inflammatories (NSAID): Secondary | ICD-10-CM | POA: Insufficient documentation

## 2017-03-09 DIAGNOSIS — Z79899 Other long term (current) drug therapy: Secondary | ICD-10-CM | POA: Insufficient documentation

## 2017-03-09 DIAGNOSIS — Z888 Allergy status to other drugs, medicaments and biological substances status: Secondary | ICD-10-CM | POA: Diagnosis not present

## 2017-03-09 DIAGNOSIS — E1065 Type 1 diabetes mellitus with hyperglycemia: Secondary | ICD-10-CM | POA: Diagnosis not present

## 2017-03-09 DIAGNOSIS — E66813 Obesity, class 3: Secondary | ICD-10-CM | POA: Diagnosis present

## 2017-03-09 DIAGNOSIS — Z9641 Presence of insulin pump (external) (internal): Secondary | ICD-10-CM | POA: Diagnosis not present

## 2017-03-09 DIAGNOSIS — I10 Essential (primary) hypertension: Secondary | ICD-10-CM | POA: Diagnosis present

## 2017-03-09 DIAGNOSIS — E871 Hypo-osmolality and hyponatremia: Secondary | ICD-10-CM | POA: Diagnosis present

## 2017-03-09 DIAGNOSIS — E039 Hypothyroidism, unspecified: Secondary | ICD-10-CM | POA: Insufficient documentation

## 2017-03-09 DIAGNOSIS — IMO0002 Reserved for concepts with insufficient information to code with codable children: Secondary | ICD-10-CM | POA: Diagnosis present

## 2017-03-09 LAB — CBC
HCT: 37.9 % (ref 36.0–46.0)
Hemoglobin: 12.9 g/dL (ref 12.0–15.0)
MCH: 29.5 pg (ref 26.0–34.0)
MCHC: 34 g/dL (ref 30.0–36.0)
MCV: 86.5 fL (ref 78.0–100.0)
Platelets: 238 10*3/uL (ref 150–400)
RBC: 4.38 MIL/uL (ref 3.87–5.11)
RDW: 13 % (ref 11.5–15.5)
WBC: 12.3 10*3/uL — ABNORMAL HIGH (ref 4.0–10.5)

## 2017-03-09 LAB — URINALYSIS, ROUTINE W REFLEX MICROSCOPIC
BILIRUBIN URINE: NEGATIVE
Glucose, UA: >=500 mg/dL — AB
HGB URINE DIPSTICK: NEGATIVE
Ketones, ur: NEGATIVE mg/dL
Leukocytes, UA: NEGATIVE
NITRITE: NEGATIVE
PH: 6 (ref 5.0–8.0)
Protein, ur: NEGATIVE mg/dL
SPECIFIC GRAVITY, URINE: 1.005 (ref 1.005–1.030)

## 2017-03-09 LAB — BASIC METABOLIC PANEL
ANION GAP: 11 (ref 5–15)
BUN: 31 mg/dL — AB (ref 6–20)
CHLORIDE: 90 mmol/L — AB (ref 101–111)
CO2: 24 mmol/L (ref 22–32)
Calcium: 9.3 mg/dL (ref 8.9–10.3)
Creatinine, Ser: 1.83 mg/dL — ABNORMAL HIGH (ref 0.44–1.00)
GFR, EST AFRICAN AMERICAN: 37 mL/min — AB (ref >60)
GFR, EST NON AFRICAN AMERICAN: 32 mL/min — AB (ref >60)
Glucose, Bld: 469 mg/dL — ABNORMAL HIGH (ref 65–99)
POTASSIUM: 4.8 mmol/L (ref 3.5–5.1)
SODIUM: 125 mmol/L — AB (ref 135–145)

## 2017-03-09 LAB — GLUCOSE, CAPILLARY: Glucose-Capillary: 387 mg/dL — ABNORMAL HIGH (ref 65–99)

## 2017-03-09 LAB — CBG MONITORING, ED
GLUCOSE-CAPILLARY: 505 mg/dL — AB (ref 65–99)
Glucose-Capillary: 436 mg/dL — ABNORMAL HIGH (ref 65–99)
Glucose-Capillary: 526 mg/dL (ref 65–99)
Glucose-Capillary: 453 mg/dL — ABNORMAL HIGH (ref 65–99)

## 2017-03-09 MED ORDER — ACETAMINOPHEN 500 MG PO TABS: 1000.0000 mg | ORAL_TABLET | Freq: Four times a day (QID) | ORAL | Status: DC | PRN

## 2017-03-09 MED ORDER — LUBIPROSTONE 24 MCG PO CAPS
24.0000 ug | ORAL_CAPSULE | Freq: Two times a day (BID) | ORAL | Status: DC
  Administered 2017-03-10: 24 ug via ORAL
  Filled 2017-03-09: qty 1

## 2017-03-09 MED ORDER — LEVOTHYROXINE SODIUM 75 MCG PO TABS
150.0000 ug | ORAL_TABLET | Freq: Every day | ORAL | Status: DC
  Administered 2017-03-10: 150 ug via ORAL
  Filled 2017-03-09: qty 2

## 2017-03-09 MED ORDER — LURASIDONE HCL 20 MG PO TABS
20.0000 mg | ORAL_TABLET | Freq: Every day | ORAL | Status: DC
  Administered 2017-03-10: 20 mg via ORAL
  Filled 2017-03-09 (×2): qty 1

## 2017-03-09 MED ORDER — ONDANSETRON 4 MG PO TBDP
4.0000 mg | ORAL_TABLET | Freq: Three times a day (TID) | ORAL | Status: DC | PRN
  Filled 2017-03-09: qty 1

## 2017-03-09 MED ORDER — ONDANSETRON HCL 4 MG/2ML IJ SOLN
4.0000 mg | Freq: Once | INTRAMUSCULAR | Status: AC
Start: 2017-03-09 — End: 2017-03-09
  Administered 2017-03-09: 4 mg via INTRAVENOUS
  Filled 2017-03-09: qty 2

## 2017-03-09 MED ORDER — FLUTICASONE PROPIONATE 50 MCG/ACT NA SUSP: 2.0000 | Freq: Every day | NASAL | Status: DC | PRN

## 2017-03-09 MED ORDER — TRAMADOL HCL 50 MG PO TABS: 50.0000 mg | ORAL_TABLET | Freq: Two times a day (BID) | ORAL | Status: DC | PRN

## 2017-03-09 MED ORDER — DEXTROSE-NACL 5-0.45 % IV SOLN
INTRAVENOUS | Status: DC
  Administered 2017-03-09: via INTRAVENOUS

## 2017-03-09 MED ORDER — ZOLPIDEM TARTRATE 5 MG PO TABS
5.0000 mg | ORAL_TABLET | Freq: Every evening | ORAL | Status: DC | PRN
  Administered 2017-03-09: 5 mg via ORAL
  Filled 2017-03-09: qty 1

## 2017-03-09 MED ORDER — SODIUM CHLORIDE 0.9 % IV BOLUS (SEPSIS)
1000.0000 mL | Freq: Once | INTRAVENOUS | Status: AC
  Administered 2017-03-09: 1000 mL via INTRAVENOUS

## 2017-03-09 MED ORDER — CARVEDILOL 12.5 MG PO TABS
37.5000 mg | ORAL_TABLET | Freq: Two times a day (BID) | ORAL | Status: DC
  Administered 2017-03-10: 37.5 mg via ORAL
  Filled 2017-03-09: qty 3

## 2017-03-09 MED ORDER — LORATADINE 10 MG PO TABS
10.0000 mg | ORAL_TABLET | Freq: Every day | ORAL | Status: DC
  Administered 2017-03-10: 10 mg via ORAL
  Filled 2017-03-09: qty 1

## 2017-03-09 MED ORDER — VITAMIN D 1000 UNITS PO TABS
1000.0000 [IU] | ORAL_TABLET | Freq: Every day | ORAL | Status: DC
  Administered 2017-03-10: 1000 [IU] via ORAL
  Filled 2017-03-09: qty 1

## 2017-03-09 MED ORDER — ALBUTEROL SULFATE (2.5 MG/3ML) 0.083% IN NEBU: 3.0000 mL | INHALATION_SOLUTION | Freq: Four times a day (QID) | RESPIRATORY_TRACT | Status: DC | PRN

## 2017-03-09 MED ORDER — DIAZEPAM 5 MG PO TABS: 5.0000 mg | ORAL_TABLET | Freq: Two times a day (BID) | ORAL | Status: DC | PRN

## 2017-03-09 MED ORDER — PANTOPRAZOLE SODIUM 40 MG PO TBEC
40.0000 mg | DELAYED_RELEASE_TABLET | Freq: Every day | ORAL | Status: DC
  Administered 2017-03-10: 40 mg via ORAL
  Filled 2017-03-09: qty 1

## 2017-03-09 MED ORDER — DULOXETINE HCL 60 MG PO CPEP
60.0000 mg | ORAL_CAPSULE | Freq: Two times a day (BID) | ORAL | Status: DC
  Administered 2017-03-09 – 2017-03-10 (×2): 60 mg via ORAL
  Filled 2017-03-09 (×2): qty 1

## 2017-03-09 MED ORDER — FOLIC ACID 1 MG PO TABS
1.0000 mg | ORAL_TABLET | Freq: Every day | ORAL | Status: DC
  Administered 2017-03-10: 1 mg via ORAL
  Filled 2017-03-09: qty 1

## 2017-03-09 MED ORDER — ENOXAPARIN SODIUM 80 MG/0.8ML ~~LOC~~ SOLN
70.0000 mg | SUBCUTANEOUS | Status: DC
  Administered 2017-03-09: 23:00:00 70 mg via SUBCUTANEOUS
  Filled 2017-03-09: qty 0.8

## 2017-03-09 MED ORDER — SODIUM CHLORIDE 0.9 % IV SOLN
INTRAVENOUS | Status: DC
  Administered 2017-03-09: 4.5 [IU]/h via INTRAVENOUS
  Filled 2017-03-09 (×2): qty 1

## 2017-03-09 MED ORDER — RIZATRIPTAN BENZOATE 10 MG PO TBDP: 10.0000 mg | ORAL_TABLET | ORAL | Status: DC | PRN

## 2017-03-09 MED ORDER — FUROSEMIDE 40 MG PO TABS
40.0000 mg | ORAL_TABLET | Freq: Every day | ORAL | Status: DC
Start: 2017-03-10 — End: 2017-03-10
  Administered 2017-03-10: 40 mg via ORAL
  Filled 2017-03-09: qty 1

## 2017-03-09 MED ORDER — FOLIC ACID 800 MCG PO TABS: 800.0000 ug | ORAL_TABLET | Freq: Every day | ORAL | Status: DC

## 2017-03-09 MED ORDER — SODIUM CHLORIDE 0.9 % IV SOLN
INTRAVENOUS | Status: DC
  Administered 2017-03-09: 19:00:00 via INTRAVENOUS

## 2017-03-09 MED ORDER — ASPIRIN EC 81 MG PO TBEC
81.0000 mg | DELAYED_RELEASE_TABLET | Freq: Every evening | ORAL | Status: DC
  Administered 2017-03-09: 81 mg via ORAL
  Filled 2017-03-09: qty 1

## 2017-03-09 MED ORDER — LAMOTRIGINE 100 MG PO TABS
200.0000 mg | ORAL_TABLET | Freq: Every day | ORAL | Status: DC
  Administered 2017-03-10: 200 mg via ORAL
  Filled 2017-03-09 (×2): qty 1

## 2017-03-09 MED ORDER — PRAVASTATIN SODIUM 40 MG PO TABS
80.0000 mg | ORAL_TABLET | Freq: Every day | ORAL | Status: DC
  Administered 2017-03-09: 80 mg via ORAL
  Filled 2017-03-09: qty 2

## 2017-03-09 MED ORDER — SODIUM CHLORIDE 0.9 % IV SOLN
INTRAVENOUS | Status: DC
  Administered 2017-03-09 – 2017-03-10 (×2): via INTRAVENOUS

## 2017-03-09 NOTE — ED Provider Notes (Signed)
[facility] DEPT Provider Note   CSN: [id number] Arrival date & time: 03/09/17  2993     History   Chief Complaint Chief Complaint  Patient presents with  . Hyperglycemia    HPI Jasmine [name] is a 46 y.o. female.  Patient is insulin-dependent diabetic uses insulin pump. Patient had the cortisone injection in her left knee yesterday. Since that time blood sugars been running extremely high sugars over 600 despite her bolusing to her sliding scale. Patient has concerns and going to DKA. Patient without any abdominal pain. Does have some nausea and a mild headache. No fevers no other problems. No significant abnormalities with the left knee.      Past Medical History:  Diagnosis Date  . Anxiety   . Bipolar disorder ([place])   . Chronic fatigue   . CKD (chronic kidney disease), stage II   . Depression   . Diabetes mellitus   . DKA, type 1 ([place]) 11/04/2011  . Elevated cholesterol   . Fibromyalgia   . GERD (gastroesophageal reflux disease)   . Headache   . Hyperlipemia   . Hypertension   . Hypothyroidism   . IBS (irritable bowel syndrome)   . Obesity   . Sleep apnea    HAS C -PAP / DOES NOT USE  . Stress incontinence    Pt had surgery to correct this.  . Tachycardia   . Tobacco abuse   . UTI (lower urinary tract infection)     Patient Active Problem List   Diagnosis Date Noted  . Chronic migraine 01/17/2017  . Diabetic peripheral neuropathy ([place]) 01/17/2017  . OSA (obstructive sleep apnea) 07/27/2016  . Wheezing 07/27/2016  . Hyperglycemia 12/25/2012  . Acute on chronic renal failure ([place]) 12/25/2012  . Pulmonary infiltrates 10/02/2012  . Postnasal drip 10/02/2012  . Hyperkalemia 09/25/2012  . Chronic kidney disease, stage III (moderate) ([place]) 09/25/2012  . Morbid obesity ([place]) 09/24/2012  . HTN (hypertension) 09/24/2012  . Bipolar II disorder ([place]) 09/24/2012  . URI (upper respiratory infection) 09/24/2012  . DKA, type 1 ([place]) 11/04/2011  .  Gastroenteritis 11/03/2011  . DM (diabetes mellitus), type 1, uncontrolled ([place]) 11/03/2011  . Hyponatremia 11/03/2011  . CKD (chronic kidney disease), stage II 11/03/2011  . Elevated lipase 11/03/2011  . Hypothyroidism 11/03/2011  . Tobacco abuse 11/03/2011  . SUI (stress urinary incontinence, female) 08/16/2011    Past Surgical History:  Procedure Laterality Date  . INCONTINENCE SURGERY    . NASAL FRACTURE SURGERY    . ovary removed    . PUBOVAGINAL SLING  08/16/2011   Procedure: [name];  Surgeon: Bernestine Amass, MD;  Location: [facility];  Service: Urology;  Laterality: N/A;         . UTERINE FIBROID SURGERY  2001    OB History    Gravida Para Term Preterm AB Living   1             SAB TAB Ectopic Multiple Live Births                   Home Medications    Prior to Admission medications   Medication Sig Start Date End Date Taking? Authorizing Provider  acetaminophen (TYLENOL) 500 MG tablet Take 1,000 mg by mouth every 6 (six) hours as needed. For pain   Yes [provider]  Apple Cider Vinegar 600 MG CAPS Take 1 capsule by mouth daily.    Yes [provider]  aspirin EC 81 MG tablet  Take 81 mg by mouth every evening.    Yes [provider]  carvedilol (COREG) 25 MG tablet Take 37.5 mg by mouth 2 (two) times daily with a meal.    Yes [provider]  cholecalciferol (VITAMIN D) 1000 UNITS tablet Take 1,000 Units by mouth daily.   Yes [provider]  diazepam (VALIUM) 5 MG tablet Take 5 mg by mouth 2 (two) times daily as needed for anxiety or muscle spasms.    Yes [provider]  diclofenac sodium (VOLTAREN) 1 % GEL Apply 2 g topically daily as needed (for pain).   Yes [provider]  DULoxetine (CYMBALTA) 60 MG capsule Take 1 capsule (60 mg total) by mouth 2 (two) times daily. 02/16/17  Yes Hisada, Elie Goody, MD  fexofenadine (ALLEGRA) 180 MG tablet Take 180 mg by mouth daily.    Yes [provider]  fluticasone (FLONASE) 50 MCG/ACT nasal spray Place 2 sprays into the nose daily as needed for allergies.    Yes [provider]  folic acid (FOLVITE) 378 MCG tablet Take 800 mcg by mouth daily.   Yes [provider]  furosemide (LASIX) 40 MG tablet Take 40 mg by mouth daily.    Yes [provider]  insulin aspart (NOVOLOG) 100 UNIT/ML injection Inject into the skin continuous. Daily Bolus of 2.9 units every hour   Yes [provider]  lamoTRIgine (LAMICTAL) 200 MG tablet Take 1 tablet (200 mg total) by mouth daily. 02/16/17  Yes Hisada, Elie Goody, MD  levocetirizine (XYZAL) 5 MG tablet Take 5 mg by mouth every morning.   Yes [provider]  levothyroxine (SYNTHROID, LEVOTHROID) 150 MCG tablet Take 150 mcg by mouth daily before breakfast.   Yes [provider]  lubiprostone (AMITIZA) 24 MCG capsule Take 24 mcg by mouth 2 (two) times daily with a meal.    Yes [provider]  lurasidone (LATUDA) 20 MG TABS tablet Take 1 tablet (20 mg total) by mouth daily. 02/16/17  Yes Hisada, Elie Goody, MD  omeprazole (PRILOSEC) 20 MG capsule Take 20 mg by mouth at bedtime.   Yes [provider]  ondansetron (ZOFRAN ODT) 4 MG disintegrating tablet Take 1 tablet (4 mg total) by mouth every 8 (eight) hours as needed. 01/17/17  Yes Marcial Pacas, MD  pravastatin (PRAVACHOL) 80 MG tablet Take 80 mg by mouth at bedtime.   Yes [provider]  PROAIR HFA 108 (90 Base) MCG/ACT inhaler INHALE TWO PUFFS INTO THE LUNGS EVERY 6 HOURS AS NEEDED FOR WHEEZING OR SHORTNESS OF BREATH 12/30/16  Yes [provider]  rizatriptan (MAXALT-MLT) 10 MG disintegrating tablet Take 1 tablet (10 mg total) by mouth as needed. May repeat in 2 hours if needed 01/17/17  Yes Marcial Pacas, MD  SALINE NASAL MIST NA Place 1-2 sprays into the nose daily as needed (FOR CONGESTION).    Yes [provider]  traMADol (ULTRAM) 50 MG tablet Take 50 mg by mouth 2  (two) times daily as needed.    Yes [provider]  VITAMIN B COMPLEX-C CAPS Take 1 capsule by mouth daily.    Yes [provider]  zolpidem (AMBIEN) 5 MG tablet 2.5-5 mg at night as needed for sleep 02/16/17  Yes Hisada, Elie Goody, MD  cephALEXin (KEFLEX) 500 MG capsule take 1  Capsule by mouth 3 times daily FOR SEVEN DAYS 02/24/17   [provider]    Family History Family History  Problem Relation Age of Onset  .  Asthma Mother   . Bipolar disorder Mother   . Heart disease Father   . Lymphoma Father   . Hypertension Father   . Thyroid disease Father   . Hyperlipidemia Father   . Diabetes Father   . Cancer Paternal Grandmother        lung and breast  . Bladder Cancer Paternal Grandfather   . Suicidality Maternal Grandfather   . Thyroid disease Brother     Social History Social History  Substance Use Topics  . Smoking status: Former Smoker    Packs/day: 0.75    Years: 20.00    Types: Cigarettes    Quit date: 06/07/2012  . Smokeless tobacco: Never Used  . Alcohol use No     Allergies   Ciprofloxacin; Buspar [buspirone]; Advair diskus [fluticasone-salmeterol]; Levaquin [levofloxacin]; Biaxin [clarithromycin]; and Hydroxyzine   Review of Systems Review of Systems  Constitutional: Negative for fever.  HENT: Negative for congestion.   Eyes: Negative for visual disturbance.  Respiratory: Negative for shortness of breath.   Gastrointestinal: Positive for nausea. Negative for abdominal pain.  Genitourinary: Negative for dysuria.  Musculoskeletal: Positive for arthralgias.  Skin: Negative for rash.  Allergic/Immunologic: Positive for immunocompromised state.  Neurological: Positive for headaches.  Hematological: Does not bruise/bleed easily.  Psychiatric/Behavioral: Negative for confusion.     Physical Exam Updated Vital Signs BP (!) 162/71 (BP Location: Right Arm)   Pulse 70   Temp 97.8 F (36.6 C) (Oral)   Resp 18   Ht 1.753 m (5\' 9" )    Wt (!) 140.6 kg (310 lb)   SpO2 93%   BMI 45.78 kg/m   Physical Exam  Constitutional: She is oriented to person, place, and time. She appears well-developed and well-nourished. No distress.  HENT:  Head: Normocephalic and atraumatic.  Mouth/Throat: Oropharynx is clear and moist.  Eyes: Pupils are equal, round, and reactive to light. Conjunctivae and EOM are normal.  Neck: Normal range of motion. Neck supple.  Cardiovascular: Normal rate, regular rhythm and normal heart sounds.   Pulmonary/Chest: Effort normal and breath sounds normal.  Abdominal: Soft. Bowel sounds are normal. There is no tenderness.  Musculoskeletal: Normal range of motion.  Neurological: She is alert and oriented to person, place, and time. No cranial nerve deficit or sensory deficit. She exhibits normal muscle tone. Coordination normal.  Skin: Skin is warm.  Nursing note and vitals reviewed.    ED Treatments / Results  Labs (all labs ordered are listed, but only abnormal results are displayed) Labs Reviewed  BASIC METABOLIC PANEL - Abnormal; Notable for the following:       Result Value   Sodium 125 (*)    Chloride 90 (*)    Glucose, Bld 469 (*)    BUN 31 (*)    Creatinine, Ser 1.83 (*)    GFR calc non Af Amer 32 (*)    GFR calc Af Amer 37 (*)    All other components within normal limits  CBC - Abnormal; Notable for the following:    WBC 12.3 (*)    All other components within normal limits  CBG MONITORING, ED - Abnormal; Notable for the following:    Glucose-Capillary 436 (*)    All other components within normal limits  CBG MONITORING, ED - Abnormal; Notable for the following:    Glucose-Capillary 505 (*)    All other components within normal limits  URINALYSIS, ROUTINE W REFLEX MICROSCOPIC    EKG  EKG Interpretation None  Radiology No results found.  Procedures Procedures (including critical care time) CRITICAL CARE Performed by: Everlena Mackley Total critical care time: 30  minutes Critical care time was exclusive of separately billable procedures and treating other patients. Critical care was necessary to treat or prevent imminent or life-threatening deterioration. Critical care was time spent personally by me on the following activities: development of treatment plan with patient and/or surrogate as well as nursing, discussions with consultants, evaluation of patient's response to treatment, examination of patient, obtaining history from patient or surrogate, ordering and performing treatments and interventions, ordering and review of laboratory studies, ordering and review of radiographic studies, pulse oximetry and re-evaluation of patient's condition.   Medications Ordered in ED Medications  0.9 %  sodium chloride infusion ( Intravenous New Bag/Given 03/09/17 1855)  dextrose 5 %-0.45 % sodium chloride infusion (not administered)  insulin regular (NOVOLIN R,HUMULIN R) 100 Units in sodium chloride 0.9 % 100 mL (1 Units/mL) infusion (not administered)  sodium chloride 0.9 % bolus 1,000 mL (not administered)    And  0.9 %  sodium chloride infusion (not administered)  sodium chloride 0.9 % bolus 1,000 mL (0 mLs Intravenous Stopped 03/09/17 1855)  ondansetron (ZOFRAN) injection 4 mg (4 mg Intravenous Given 03/09/17 1748)     Initial Impression / Assessment and Plan / ED Course  I have reviewed the triage vital signs and the nursing notes.  Pertinent labs & imaging results that were available during my care of the patient were reviewed by me and considered in my medical decision making (see chart for details).    The patient with insulin pump. Patient with recent dust cortisone injection into left knee. Blood sugars been running 600 or better at home despite bolusing her pump frequently. Patient came in here with blood sugar in the upper 400s K some fluids rechecked 1 up to 505 square that she is going to need the additional assistance in controlling her blood  sugars. No evidence of DKA. Patient will have her insulin pump stopped and started on glucose stabilizer. Discussed with hospitalist they will admit.   Final Clinical Impressions(s) / ED Diagnoses   Final diagnoses:  Hyperglycemia    New Prescriptions New Prescriptions   No medications on file     Fredia Sorrow, MD 03/09/17 1918

## 2017-03-09 NOTE — Progress Notes (Signed)
Lovenox for DVT prophylaxis - adjusted for BMI >30    Allergies  Allergen Reactions  . Ciprofloxacin Swelling    Per pt caused lips swell and nauseous feeling  . Buspar [Buspirone] Other (See Comments)    abd cramping  . Advair Diskus [Fluticasone-Salmeterol] Other (See Comments)    Thrush   . Levaquin [Levofloxacin]     Per pt caused lips swell and nauseous feeling  . Biaxin [Clarithromycin] Rash  . Hydroxyzine Palpitations    other    Patient Measurements: Height: 5\' 9"  (175.3 cm) Weight: (!) 310 lb (140.6 kg) IBW/kg (Calculated) : 66.2   Vital Signs: Temp: 97.8 F (36.6 C) (10/03 1654) Temp Source: Oral (10/03 1654) BP: 166/78 (10/03 2030) Pulse Rate: 73 (10/03 2030) Intake/Output from previous day: No intake/output data recorded. Intake/Output from this shift: Total I/O In: 1000 [IV Piggyback:1000] Out: -   Labs:  Recent Labs  03/09/17 1654  WBC 12.3*  HGB 12.9  HCT 37.9  PLT 238  CREATININE 1.83*   Estimated Creatinine Clearance: 58.8 mL/min (A) (by C-G formula based on SCr of 1.83 mg/dL (H)).   Microbiology: No results found for this or any previous visit (from the past 720 hour(s)).  Medical History: Past Medical History:  Diagnosis Date  . Anxiety   . Bipolar disorder ([place])   . Chronic fatigue   . CKD (chronic kidney disease), stage II   . Depression   . Diabetes mellitus   . DKA, type 1 ([place]) 11/04/2011  . Elevated cholesterol   . Fibromyalgia   . GERD (gastroesophageal reflux disease)   . Headache   . Hyperlipemia   . Hypertension   . Hypothyroidism   . IBS (irritable bowel syndrome)   . Obesity   . Sleep apnea    HAS C -PAP / DOES NOT USE  . Stress incontinence    Pt had surgery to correct this.  . Tachycardia   . Tobacco abuse   . UTI (lower urinary tract infection)     Medications:  Prescriptions Prior to Admission  Medication Sig Dispense Refill Last Dose  . acetaminophen (TYLENOL) 500 MG tablet Take 1,000 mg by mouth  every 6 (six) hours as needed. For pain   03/09/2017 at Unknown time  . Apple Cider Vinegar 600 MG CAPS Take 1 capsule by mouth daily.    03/09/2017 at Unknown time  . aspirin EC 81 MG tablet Take 81 mg by mouth every evening.    03/08/2017 at Unknown time  . carvedilol (COREG) 25 MG tablet Take 37.5 mg by mouth 2 (two) times daily with a meal.    03/09/2017 at 1100  . cholecalciferol (VITAMIN D) 1000 UNITS tablet Take 1,000 Units by mouth daily.   03/08/2017 at Unknown time  . diazepam (VALIUM) 5 MG tablet Take 5 mg by mouth 2 (two) times daily as needed for anxiety or muscle spasms.    03/09/2017 at Unknown time  . diclofenac sodium (VOLTAREN) 1 % GEL Apply 2 g topically daily as needed (for pain).   Past Week at Unknown time  . DULoxetine (CYMBALTA) 60 MG capsule Take 1 capsule (60 mg total) by mouth 2 (two) times daily. 60 capsule 1 03/09/2017 at Unknown time  . fexofenadine (ALLEGRA) 180 MG tablet Take 180 mg by mouth daily.    03/09/2017 at Unknown time  . fluticasone (FLONASE) 50 MCG/ACT nasal spray Place 2 sprays into the nose daily as needed for allergies.    Past Week at  Unknown time  . folic acid (FOLVITE) 270 MCG tablet Take 800 mcg by mouth daily.   03/08/2017 at Unknown time  . furosemide (LASIX) 40 MG tablet Take 40 mg by mouth daily.    03/09/2017 at Unknown time  . insulin aspart (NOVOLOG) 100 UNIT/ML injection Inject into the skin continuous. Daily Bolus of 2.9 units every hour   03/09/2017 at Unknown time  . lamoTRIgine (LAMICTAL) 200 MG tablet Take 1 tablet (200 mg total) by mouth daily. 30 tablet 1 03/09/2017 at 1100a  . levocetirizine (XYZAL) 5 MG tablet Take 5 mg by mouth every morning.   03/09/2017 at Unknown time  . levothyroxine (SYNTHROID, LEVOTHROID) 150 MCG tablet Take 150 mcg by mouth daily before breakfast.   03/09/2017 at Unknown time  . lubiprostone (AMITIZA) 24 MCG capsule Take 24 mcg by mouth 2 (two) times daily with a meal.    03/09/2017 at Unknown time  . lurasidone (LATUDA) 20  MG TABS tablet Take 1 tablet (20 mg total) by mouth daily. 30 tablet 1 03/09/2017 at Unknown time  . omeprazole (PRILOSEC) 20 MG capsule Take 20 mg by mouth at bedtime.   03/08/2017 at Unknown time  . ondansetron (ZOFRAN ODT) 4 MG disintegrating tablet Take 1 tablet (4 mg total) by mouth every 8 (eight) hours as needed. 20 tablet 6 Past Week at Unknown time  . pravastatin (PRAVACHOL) 80 MG tablet Take 80 mg by mouth at bedtime.   03/08/2017 at Unknown time  . PROAIR HFA 108 (90 Base) MCG/ACT inhaler INHALE TWO PUFFS INTO THE LUNGS EVERY 6 HOURS AS NEEDED FOR WHEEZING OR SHORTNESS OF BREATH  1 03/09/2017 at Unknown time  . rizatriptan (MAXALT-MLT) 10 MG disintegrating tablet Take 1 tablet (10 mg total) by mouth as needed. May repeat in 2 hours if needed 15 tablet 6 Past Week at Unknown time  . SALINE NASAL MIST NA Place 1-2 sprays into the nose daily as needed (FOR CONGESTION).    Past Month at Unknown time  . traMADol (ULTRAM) 50 MG tablet Take 50 mg by mouth 2 (two) times daily as needed.    Past Month at Unknown time  . VITAMIN B COMPLEX-C CAPS Take 1 capsule by mouth daily.    03/09/2017 at Unknown time  . zolpidem (AMBIEN) 5 MG tablet 2.5-5 mg at night as needed for sleep 30 tablet 0 03/08/2017 at Unknown time  . cephALEXin (KEFLEX) 500 MG capsule take 1  Capsule by mouth 3 times daily FOR SEVEN DAYS  0 Completed Course at Unknown time    Assessment: 46 yo F admitted for hyperglycemia.  Lovenox ordered for dvt pxl.  BMI 45.6  Goal of Therapy:  dvt pxl  Plan:  Adjust lovenox to 70 mg sq Q24h (0.5 mg/kg sq q24h for BMI >30)  Vonda Antigua 03/09/2017,10:18 PM

## 2017-03-09 NOTE — ED Triage Notes (Signed)
Pt c/o high blood sugars over 600 that started yesterday after getting a cortisone shot yesterday in her knee. Pt is on an insulin pump and has been adjusting insulin based off her blood sugars but pt cannot seem to get blood sugar in normal range.

## 2017-03-09 NOTE — H&P (Signed)
TRH H&P   Patient Demographics:    Jasmine [name], is a 46 y.o. female  MRN: [id number]   DOB - Nov 25, 1970  Admit Date - 03/09/2017  Outpatient Primary MD for the patient is Aletha Halim., PA-C  Referring MD/NP/PA:   Aundria Rud  Outpatient Specialists:  Dr. Veverly Fells (orthopedics)  Patient coming from: home  Chief Complaint  Patient presents with  . Hyperglycemia      HPI:    Jasmine [name]  is a 46 y.o. female, w dm1, ckd stage3, c/o hyperglycemia 600+ yesterday and today remained high and therefore presented to ED.  Pt attributes her hyperglycemia to a cortisone shot.    In ED bs 469  => 505 even after insulin and fluid in ED.  Pt will be admitted obs for hyperglycemia.      Review of systems:    In addition to the HPI above,  + dry mouth, + polyuria.   No Fever-chills, No Headache, No changes with Vision or hearing, No problems swallowing food or Liquids, No Chest pain, Cough or Shortness of Breath, No Abdominal pain, No Nausea or Vommitting, Bowel movements are regular, No Blood in stool or Urine, No dysuria, No new skin rashes or bruises, No new joints pains-aches,  No new weakness, tingling, numbness in any extremity, No recent weight gain or loss,  No significant Mental Stressors.  A full 10 point Review of Systems was done, except as stated above, all other Review of Systems were negative.   With Past History of the following :    Past Medical History:  Diagnosis Date  . Anxiety   . Bipolar disorder ([place])   . Chronic fatigue   . CKD (chronic kidney disease), stage II   . Depression   . Diabetes mellitus   . DKA, type 1 ([place]) 11/04/2011  . Elevated cholesterol   . Fibromyalgia   . GERD (gastroesophageal reflux disease)   . Headache   . Hyperlipemia   . Hypertension   . Hypothyroidism   . IBS (irritable bowel syndrome)   .  Obesity   . Sleep apnea    HAS C -PAP / DOES NOT USE  . Stress incontinence    Pt had surgery to correct this.  . Tachycardia   . Tobacco abuse   . UTI (lower urinary tract infection)       Past Surgical History:  Procedure Laterality Date  . INCONTINENCE SURGERY    . NASAL FRACTURE SURGERY    . ovary removed    . PUBOVAGINAL SLING  08/16/2011   Procedure: [name];  Surgeon: Bernestine Amass, MD;  Location: [facility];  Service: Urology;  Laterality: N/A;         . UTERINE FIBROID SURGERY  2001      Social History:     Social History  Substance Use Topics  . Smoking  status: Former Smoker    Packs/day: 0.75    Years: 20.00    Types: Cigarettes    Quit date: 06/07/2012  . Smokeless tobacco: Never Used  . Alcohol use No     Lives - at home  Mobility - walks by self   Family History :     Family History  Problem Relation Age of Onset  . Asthma Mother   . Bipolar disorder Mother   . Heart disease Father   . Lymphoma Father   . Hypertension Father   . Thyroid disease Father   . Hyperlipidemia Father   . Diabetes Father   . Cancer Paternal Grandmother        lung and breast  . Bladder Cancer Paternal Grandfather   . Suicidality Maternal Grandfather   . Thyroid disease Brother       Home Medications:   Prior to Admission medications   Medication Sig Start Date End Date Taking? Authorizing Provider  acetaminophen (TYLENOL) 500 MG tablet Take 1,000 mg by mouth every 6 (six) hours as needed. For pain   Yes [provider]  Apple Cider Vinegar 600 MG CAPS Take 1 capsule by mouth daily.    Yes [provider]  aspirin EC 81 MG tablet Take 81 mg by mouth every evening.    Yes [provider]  carvedilol (COREG) 25 MG tablet Take 37.5 mg by mouth 2 (two) times daily with a meal.    Yes [provider]  cholecalciferol (VITAMIN D) 1000 UNITS tablet Take 1,000 Units by mouth daily.   Yes [provider]  diazepam  (VALIUM) 5 MG tablet Take 5 mg by mouth 2 (two) times daily as needed for anxiety or muscle spasms.    Yes [provider]  diclofenac sodium (VOLTAREN) 1 % GEL Apply 2 g topically daily as needed (for pain).   Yes [provider]  DULoxetine (CYMBALTA) 60 MG capsule Take 1 capsule (60 mg total) by mouth 2 (two) times daily. 02/16/17  Yes Hisada, Elie Goody, MD  fexofenadine (ALLEGRA) 180 MG tablet Take 180 mg by mouth daily.    Yes [provider]  fluticasone (FLONASE) 50 MCG/ACT nasal spray Place 2 sprays into the nose daily as needed for allergies.    Yes [provider]  folic acid (FOLVITE) 381 MCG tablet Take 800 mcg by mouth daily.   Yes [provider]  furosemide (LASIX) 40 MG tablet Take 40 mg by mouth daily.    Yes [provider]  insulin aspart (NOVOLOG) 100 UNIT/ML injection Inject into the skin continuous. Daily Bolus of 2.9 units every hour   Yes [provider]  lamoTRIgine (LAMICTAL) 200 MG tablet Take 1 tablet (200 mg total) by mouth daily. 02/16/17  Yes Hisada, Elie Goody, MD  levocetirizine (XYZAL) 5 MG tablet Take 5 mg by mouth every morning.   Yes [provider]  levothyroxine (SYNTHROID, LEVOTHROID) 150 MCG tablet Take 150 mcg by mouth daily before breakfast.   Yes [provider]  lubiprostone (AMITIZA) 24 MCG capsule Take 24 mcg by mouth 2 (two) times daily with a meal.    Yes [provider]  lurasidone (LATUDA) 20 MG TABS tablet Take 1 tablet (20 mg total) by mouth daily. 02/16/17  Yes Hisada, Elie Goody, MD  omeprazole (PRILOSEC) 20 MG capsule Take 20 mg by mouth at bedtime.   Yes [provider]  ondansetron (ZOFRAN ODT) 4 MG disintegrating tablet Take 1 tablet (4 mg total)  by mouth every 8 (eight) hours as needed. 01/17/17  Yes Marcial Pacas, MD  pravastatin (PRAVACHOL) 80 MG tablet Take 80 mg by mouth at bedtime.   Yes [provider]  PROAIR HFA 108 (90 Base) MCG/ACT inhaler INHALE  TWO PUFFS INTO THE LUNGS EVERY 6 HOURS AS NEEDED FOR WHEEZING OR SHORTNESS OF BREATH 12/30/16  Yes [provider]  rizatriptan (MAXALT-MLT) 10 MG disintegrating tablet Take 1 tablet (10 mg total) by mouth as needed. May repeat in 2 hours if needed 01/17/17  Yes Marcial Pacas, MD  SALINE NASAL MIST NA Place 1-2 sprays into the nose daily as needed (FOR CONGESTION).    Yes [provider]  traMADol (ULTRAM) 50 MG tablet Take 50 mg by mouth 2 (two) times daily as needed.    Yes [provider]  VITAMIN B COMPLEX-C CAPS Take 1 capsule by mouth daily.    Yes [provider]  zolpidem (AMBIEN) 5 MG tablet 2.5-5 mg at night as needed for sleep 02/16/17  Yes Hisada, Elie Goody, MD  cephALEXin (KEFLEX) 500 MG capsule take 1  Capsule by mouth 3 times daily FOR SEVEN DAYS 02/24/17   [provider]     Allergies:     Allergies  Allergen Reactions  . Ciprofloxacin Swelling    Per pt caused lips swell and nauseous feeling  . Buspar [Buspirone] Other (See Comments)    abd cramping  . Advair Diskus [Fluticasone-Salmeterol] Other (See Comments)    Thrush   . Levaquin [Levofloxacin]     Per pt caused lips swell and nauseous feeling  . Biaxin [Clarithromycin] Rash  . Hydroxyzine Palpitations    other     Physical Exam:   Vitals  Blood pressure (!) 162/77, pulse 79, temperature 97.8 F (36.6 C), temperature source Oral, resp. rate 19, height 5\' 9"  (1.753 m), weight (!) 140.6 kg (310 lb), SpO2 92 %.   1. General  lying in bed in NAD,  2. Normal affect and insight, Not Suicidal or Homicidal, Awake Alert, Oriented X 3.  3. No F.N deficits, ALL C.Nerves Intact, Strength 5/5 all 4 extremities, Sensation intact all 4 extremities, Plantars down going.  4. Ears and Eyes appear Normal, Conjunctivae clear, PERRLA. Moist Oral Mucosa.  5. Supple Neck, No JVD, No cervical lymphadenopathy appriciated, No Carotid Bruits.  6. Symmetrical Chest wall movement, Good air  movement bilaterally, CTAB.  7. RRR, No Gallops, Rubs or Murmurs, No Parasternal Heave.  8.  Morbidly obese, Positive Bowel Sounds, Abdomen Soft, No tenderness, No organomegaly appriciated,No rebound -guarding or rigidity.  9.  No Cyanosis, Normal Skin Turgor, No Skin Rash or Bruise.  10. Good muscle tone,  joints appear normal , no effusions, Normal ROM.  11. No Palpable Lymph Nodes in Neck or Axillae     Data Review:    CBC  Recent Labs Lab 03/09/17 1654  WBC 12.3*  HGB 12.9  HCT 37.9  PLT 238  MCV 86.5  MCH 29.5  MCHC 34.0  RDW 13.0   ------------------------------------------------------------------------------------------------------------------  Chemistries   Recent Labs Lab 03/09/17 1654  NA 125*  K 4.8  CL 90*  CO2 24  GLUCOSE 469*  BUN 31*  CREATININE 1.83*  CALCIUM 9.3   ------------------------------------------------------------------------------------------------------------------ estimated creatinine clearance is 58.8 mL/min (A) (by C-G formula based on SCr of 1.83 mg/dL (H)). ------------------------------------------------------------------------------------------------------------------ No results for input(s): TSH, T4TOTAL, T3FREE, THYROIDAB in the last 72 hours.  Invalid input(s): FREET3  Coagulation profile No results for input(s): INR,  PROTIME in the last 168 hours. ------------------------------------------------------------------------------------------------------------------- No results for input(s): DDIMER in the last 72 hours. -------------------------------------------------------------------------------------------------------------------  Cardiac Enzymes No results for input(s): CKMB, TROPONINI, MYOGLOBIN in the last 168 hours.  Invalid input(s): CK ------------------------------------------------------------------------------------------------------------------ No results found for:  BNP   ---------------------------------------------------------------------------------------------------------------  Urinalysis    Component Value Date/Time   COLORURINE YELLOW 03/09/2017 [place] 03/09/2017 1654   LABSPEC 1.005 03/09/2017 1654   PHURINE 6.0 03/09/2017 1654   GLUCOSEU >=500 (A) 03/09/2017 1654   HGBUR NEGATIVE 03/09/2017 1654   BILIRUBINUR NEGATIVE 03/09/2017 1654   KETONESUR NEGATIVE 03/09/2017 1654   PROTEINUR NEGATIVE 03/09/2017 1654   UROBILINOGEN 0.2 03/19/2015 1525   NITRITE NEGATIVE 03/09/2017 1654   LEUKOCYTESUR NEGATIVE 03/09/2017 1654    ----------------------------------------------------------------------------------------------------------------   Imaging Results:    No results found.    Assessment & Plan:    Principal Problem:   Hyperglycemia Active Problems:   DM (diabetes mellitus), type 1, uncontrolled ([facility])   Hyponatremia   Morbid obesity ([facility])   HTN (hypertension)   Chronic kidney disease, stage III (moderate) ([facility])    Hyperglycemia Iv insulin Ns iv If bs <175 then can transition to SSI  Hyponatremia, pseudohyponatremia Check cmp in am  CKD stage3  Hydrate with ns iv Check cmp in am  Hypertension Cont present medication.    DVT Prophylaxis Lovenox - SCDs   AM Labs Ordered, also please review Full Orders  Family Communication: Admission, patients condition and plan of care including tests being ordered have been discussed with the patient who indicate understanding and agree with the plan and Code Status.  Code Status Full code  Likely DC to  home  Condition GUARDED    Consults called: none  Admission status: obs  Time spent in minutes : 45   Jani Gravel M.D on 03/09/2017 at 8:19 PM  Between 7am to 7pm - Pager - [phone]. After 7pm go to [email or web address] - password [facility]  [facility] - Office  [phone]

## 2017-03-10 DIAGNOSIS — E1065 Type 1 diabetes mellitus with hyperglycemia: Secondary | ICD-10-CM | POA: Diagnosis not present

## 2017-03-10 DIAGNOSIS — R739 Hyperglycemia, unspecified: Secondary | ICD-10-CM | POA: Diagnosis not present

## 2017-03-10 DIAGNOSIS — N183 Chronic kidney disease, stage 3 (moderate): Secondary | ICD-10-CM | POA: Diagnosis not present

## 2017-03-10 LAB — COMPREHENSIVE METABOLIC PANEL
ALK PHOS: 103 U/L (ref 38–126)
ALT: 18 U/L (ref 14–54)
AST: 15 U/L (ref 15–41)
Albumin: 3.5 g/dL (ref 3.5–5.0)
Anion gap: 11 (ref 5–15)
BILIRUBIN TOTAL: 0.7 mg/dL (ref 0.3–1.2)
BUN: 35 mg/dL — ABNORMAL HIGH (ref 6–20)
CALCIUM: 9.2 mg/dL (ref 8.9–10.3)
CO2: 25 mmol/L (ref 22–32)
CREATININE: 1.61 mg/dL — AB (ref 0.44–1.00)
Chloride: 100 mmol/L — ABNORMAL LOW (ref 101–111)
GFR, EST AFRICAN AMERICAN: 44 mL/min — AB (ref >60)
GFR, EST NON AFRICAN AMERICAN: 38 mL/min — AB (ref >60)
Glucose, Bld: 160 mg/dL — ABNORMAL HIGH (ref 65–99)
Potassium: 4.2 mmol/L (ref 3.5–5.1)
Sodium: 136 mmol/L (ref 135–145)
TOTAL PROTEIN: 6.6 g/dL (ref 6.5–8.1)

## 2017-03-10 LAB — GLUCOSE, CAPILLARY
GLUCOSE-CAPILLARY: 146 mg/dL — AB (ref 65–99)
GLUCOSE-CAPILLARY: 143 mg/dL — AB (ref 65–99)
GLUCOSE-CAPILLARY: 158 mg/dL — AB (ref 65–99)
GLUCOSE-CAPILLARY: 376 mg/dL — AB (ref 65–99)
GLUCOSE-CAPILLARY: 316 mg/dL — AB (ref 65–99)
GLUCOSE-CAPILLARY: 230 mg/dL — AB (ref 65–99)
Glucose-Capillary: 158 mg/dL — ABNORMAL HIGH (ref 65–99)
Glucose-Capillary: 160 mg/dL — ABNORMAL HIGH (ref 65–99)
Glucose-Capillary: 230 mg/dL — ABNORMAL HIGH (ref 65–99)
Glucose-Capillary: 366 mg/dL — ABNORMAL HIGH (ref 65–99)

## 2017-03-10 LAB — CBC
HCT: 37.2 % (ref 36.0–46.0)
Hemoglobin: 12.2 g/dL (ref 12.0–15.0)
MCH: 29 pg (ref 26.0–34.0)
MCHC: 32.8 g/dL (ref 30.0–36.0)
MCV: 88.4 fL (ref 78.0–100.0)
PLATELETS: 261 10*3/uL (ref 150–400)
RBC: 4.21 MIL/uL (ref 3.87–5.11)
RDW: 13.5 % (ref 11.5–15.5)
WBC: 11.6 10*3/uL — AB (ref 4.0–10.5)

## 2017-03-10 LAB — MRSA PCR SCREENING: MRSA by PCR: NEGATIVE

## 2017-03-10 MED ORDER — INSULIN GLARGINE 100 UNIT/ML ~~LOC~~ SOLN
10.0000 [IU] | Freq: Every day | SUBCUTANEOUS | Status: DC
  Filled 2017-03-10: qty 0.1

## 2017-03-10 MED ORDER — INSULIN ASPART 100 UNIT/ML ~~LOC~~ SOLN
0.0000 [IU] | Freq: Three times a day (TID) | SUBCUTANEOUS | Status: DC
  Administered 2017-03-10: 20 [IU] via SUBCUTANEOUS

## 2017-03-10 MED ORDER — INSULIN GLARGINE 100 UNIT/ML ~~LOC~~ SOLN
10.0000 [IU] | Freq: Every day | SUBCUTANEOUS | Status: DC
  Administered 2017-03-10: 10 [IU] via SUBCUTANEOUS
  Filled 2017-03-10: qty 0.1

## 2017-03-10 MED ORDER — INSULIN ASPART 100 UNIT/ML ~~LOC~~ SOLN: 0.0000 [IU] | Freq: Every day | SUBCUTANEOUS | Status: DC

## 2017-03-10 MED ORDER — INSULIN ASPART 100 UNIT/ML ~~LOC~~ SOLN
6.0000 [IU] | Freq: Three times a day (TID) | SUBCUTANEOUS | Status: DC
  Administered 2017-03-10: 6 [IU] via SUBCUTANEOUS

## 2017-03-10 MED ORDER — INSULIN ASPART 100 UNIT/ML ~~LOC~~ SOLN: 0.0000 [IU] | SUBCUTANEOUS | Status: DC

## 2017-03-10 NOTE — Progress Notes (Signed)
1322 CBG 230, per pt insulin pump reported not to give any insulin coverage at this time. Pt reported that her CBG remains between 130-200 at home at baseline and is requesting to be d/c home at this time.

## 2017-03-10 NOTE — Discharge Summary (Signed)
Physician Discharge Summary  Jasmine [name] IOX:[id number] DOB: 02-12-1971 DOA: 03/09/2017  PCP: Aletha Halim., PA-C  Admit date: 03/09/2017 Discharge date: 03/10/2017  Time spent: 45 minutes  Recommendations for Outpatient Follow-up:  -To be discharged home today. -Advised to follow-up with PCP in 2 weeks.   Discharge Diagnoses:  Principal Problem:   Hyperglycemia Active Problems:   DM (diabetes mellitus), type 1, uncontrolled (HCC)   Hyponatremia   Morbid obesity (HCC)   HTN (hypertension)   Chronic kidney disease, stage III (moderate) ([facility])   Discharge Condition: Stable and improved  Filed Weights   03/09/17 2205 03/10/17 0700 03/10/17 0808  Weight: (!) 141.4 kg (311 lb 11.7 oz) (!) 143 kg (315 lb 4.1 oz) (!) 142.2 kg (313 lb 6.4 oz)    History of present illness:  As per Dr. Maudie Mercury on 10/3: Jasmine [name]  is a 46 y.o. female, w dm1, ckd stage3, c/o hyperglycemia 600+ yesterday and today remained high and therefore presented to ED.  Pt attributes her hyperglycemia to a cortisone shot.    In ED bs 469  => 505 even after insulin and fluid in ED.  Pt will be admitted obs for hyperglycemia.    Hospital Course:   Uncontrolled type 1 diabetes with hyperglycemia -Patient uses an insulin pump. -She received a cortisone injection for osteoarthritis of the knee 2 days prior to admission and she suspects this is the reason for her hyperglycemia. -On admission patient was taken off her pump and placed on IV insulin, when she met parameters was put back on her insulin pump. -No signs of DKA.  Rest of chronic conditions have been stable  Procedures:  None   Consultations:  None  Discharge Instructions  Discharge Instructions    Diet Carb Modified    Complete by:  As directed    Increase activity slowly    Complete by:  As directed      Allergies as of 03/10/2017      Reactions   Ciprofloxacin Swelling   Per pt caused lips swell and nauseous feeling   Buspar [buspirone] Other (See Comments)   abd cramping   Advair Diskus [fluticasone-salmeterol] Other (See Comments)   Thrush    Levaquin [levofloxacin]    Per pt caused lips swell and nauseous feeling   Biaxin [clarithromycin] Rash   Hydroxyzine Palpitations   other      Medication List    STOP taking these medications   Apple Cider Vinegar 600 MG Caps   cephALEXin 500 MG capsule Commonly known as:  KEFLEX   diclofenac sodium 1 % Gel Commonly known as:  VOLTAREN   insulin aspart 100 UNIT/ML injection Commonly known as:  novoLOG     TAKE these medications   acetaminophen 500 MG tablet Commonly known as:  TYLENOL Take 1,000 mg by mouth every 6 (six) hours as needed. For pain   aspirin EC 81 MG tablet Take 81 mg by mouth every evening.   carvedilol 25 MG tablet Commonly known as:  COREG Take 37.5 mg by mouth 2 (two) times daily with a meal.   cholecalciferol 1000 units tablet Commonly known as:  VITAMIN D Take 1,000 Units by mouth daily.   diazepam 5 MG tablet Commonly known as:  VALIUM Take 5 mg by mouth 2 (two) times daily as needed for anxiety or muscle spasms.   DULoxetine 60 MG capsule Commonly known as:  CYMBALTA Take 1 capsule (60 mg total) by mouth 2 (two) times  daily.   fexofenadine 180 MG tablet Commonly known as:  ALLEGRA Take 180 mg by mouth daily.   fluticasone 50 MCG/ACT nasal spray Commonly known as:  FLONASE Place 2 sprays into the nose daily as needed for allergies.   folic acid 017 MCG tablet Commonly known as:  FOLVITE Take 800 mcg by mouth daily.   furosemide 40 MG tablet Commonly known as:  LASIX Take 40 mg by mouth daily.   lamoTRIgine 200 MG tablet Commonly known as:  LAMICTAL Take 1 tablet (200 mg total) by mouth daily.   levocetirizine 5 MG tablet Commonly known as:  XYZAL Take 5 mg by mouth every morning.   levothyroxine 150 MCG tablet Commonly known as:  SYNTHROID, LEVOTHROID Take 150 mcg by mouth daily before  breakfast.   lubiprostone 24 MCG capsule Commonly known as:  AMITIZA Take 24 mcg by mouth 2 (two) times daily with a meal.   lurasidone 20 MG Tabs tablet Commonly known as:  LATUDA Take 1 tablet (20 mg total) by mouth daily.   omeprazole 20 MG capsule Commonly known as:  PRILOSEC Take 20 mg by mouth at bedtime.   ondansetron 4 MG disintegrating tablet Commonly known as:  ZOFRAN ODT Take 1 tablet (4 mg total) by mouth every 8 (eight) hours as needed.   pravastatin 80 MG tablet Commonly known as:  PRAVACHOL Take 80 mg by mouth at bedtime.   PROAIR HFA 108 (90 Base) MCG/ACT inhaler Generic drug:  albuterol INHALE TWO PUFFS INTO THE LUNGS EVERY 6 HOURS AS NEEDED FOR WHEEZING OR SHORTNESS OF BREATH   rizatriptan 10 MG disintegrating tablet Commonly known as:  MAXALT-MLT Take 1 tablet (10 mg total) by mouth as needed. May repeat in 2 hours if needed   SALINE NASAL MIST NA Place 1-2 sprays into the nose daily as needed (FOR CONGESTION).   traMADol 50 MG tablet Commonly known as:  ULTRAM Take 50 mg by mouth 2 (two) times daily as needed.   VITAMIN B COMPLEX-C Caps Take 1 capsule by mouth daily.   zolpidem 5 MG tablet Commonly known as:  AMBIEN 2.5-5 mg at night as needed for sleep      Allergies  Allergen Reactions  . Ciprofloxacin Swelling    Per pt caused lips swell and nauseous feeling  . Buspar [Buspirone] Other (See Comments)    abd cramping  . Advair Diskus [Fluticasone-Salmeterol] Other (See Comments)    Thrush   . Levaquin [Levofloxacin]     Per pt caused lips swell and nauseous feeling  . Biaxin [Clarithromycin] Rash  . Hydroxyzine Palpitations    other   Follow-up Information    Aletha Halim., PA-C. Schedule an appointment as soon as possible for a visit in 2 week(s).   Specialty:  Family Medicine Contact information: [address] [place] [address] [phone]            The results of significant diagnostics from this  hospitalization (including imaging, microbiology, ancillary and laboratory) are listed below for reference.    Significant Diagnostic Studies: Ct Chest Wo Contrast  Result Date: 02/08/2017 CLINICAL DATA:  Pulmonary nodules. EXAM: CT CHEST WITHOUT CONTRAST TECHNIQUE: Multidetector CT imaging of the chest was performed following the standard protocol without IV contrast. COMPARISON:  10/04/2016 FINDINGS: Cardiovascular: The heart size is normal. No pericardial effusion. No thoracic aortic aneurysm. Mediastinum/Nodes: No mediastinal lymphadenopathy. No evidence for gross hilar lymphadenopathy although assessment is limited by the lack of intravenous contrast on today's study.  The esophagus has normal imaging features. Densely calcified right thyroid nodule similar to prior. There is no axillary lymphadenopathy. Lungs/Pleura: Tiny bilateral pulmonary nodules are evident, measuring up to a dominant 6 mm right middle lobe nodule seen image 76 series 4 today. No new or progressive pulmonary nodule or mass. No focal airspace consolidation. No pulmonary edema or pleural effusion. Scattered areas of air trapping again noted. Upper Abdomen: Unremarkable. Musculoskeletal: Bone windows reveal no worrisome lytic or sclerotic osseous lesions. IMPRESSION: 1. Stable exam. Tiny bilateral pulmonary nodules are unchanged measuring up to a maximum of 6 mm in the right middle lobe. Repeat CT in 12-18 months recommended to reassess. Non-contrast chest CT at 3-6 months is recommended. This recommendation follows the consensus statement: Guidelines for Management of Incidental Pulmonary Nodules Detected on CT Images: From the Fleischner Society 2017; Radiology 2017; 284:228-243. 2. Stable appearance subtle mosaic attenuation suggesting air trapping. Small airways disease would be a consideration. Electronically Signed   By: Misty Stanley M.D.   On: 02/08/2017 14:48    Microbiology: Recent Results (from the past 240 hour(s))  MRSA  PCR Screening     Status: None   Collection Time: 03/09/17 10:00 PM  Result Value Ref Range Status   MRSA by PCR NEGATIVE NEGATIVE Final    Comment:        The GeneXpert MRSA Assay (FDA approved for NASAL specimens only), is one component of a comprehensive MRSA colonization surveillance program. It is not intended to diagnose MRSA infection nor to guide or monitor treatment for MRSA infections.      Labs: Basic Metabolic Panel:  Recent Labs Lab 03/09/17 1654 03/10/17 0414  NA 125* 136  K 4.8 4.2  CL 90* 100*  CO2 24 25  GLUCOSE 469* 160*  BUN 31* 35*  CREATININE 1.83* 1.61*  CALCIUM 9.3 9.2   Liver Function Tests:  Recent Labs Lab 03/10/17 0414  AST 15  ALT 18  ALKPHOS 103  BILITOT 0.7  PROT 6.6  ALBUMIN 3.5   No results for input(s): LIPASE, AMYLASE in the last 168 hours. No results for input(s): AMMONIA in the last 168 hours. CBC:  Recent Labs Lab 03/09/17 1654 03/10/17 0414  WBC 12.3* 11.6*  HGB 12.9 12.2  HCT 37.9 37.2  MCV 86.5 88.4  PLT 238 261   Cardiac Enzymes: No results for input(s): CKTOTAL, CKMB, CKMBINDEX, TROPONINI in the last 168 hours. BNP: BNP (last 3 results) No results for input(s): BNP in the last 8760 hours.  ProBNP (last 3 results) No results for input(s): PROBNP in the last 8760 hours.  CBG:  Recent Labs Lab 03/10/17 0148 03/10/17 0259 03/10/17 0354 03/10/17 0502 03/10/17 0740  GLUCAP 146* 143* 158* 158* 376*       Signed:  Lelon Frohlich  [facility] Pager: [phone] 03/10/2017, 10:59 AM

## 2017-03-10 NOTE — Plan of Care (Signed)
Problem: Tissue Perfusion: Goal: Risk factors for ineffective tissue perfusion will decrease Outcome: Progressing Pt wearing SCDs and receiving lovenox for DVT prevention

## 2017-03-10 NOTE — Progress Notes (Signed)
1101 CBG 366, patient gave herself insulin dose via insulin pump. Patient reported that she gave herself 4 units of regular insulin via insulin pump.

## 2017-03-10 NOTE — Progress Notes (Signed)
1150 CBG 316, patient gave herself insulin dose via insulin pump. Patient reported that she gave herself 4 units of regular insulin via insulin pump. Will continue to monitor.

## 2017-03-11 ENCOUNTER — Telehealth (HOSPITAL_COMMUNITY): Payer: Self-pay

## 2017-03-11 LAB — HIV ANTIBODY (ROUTINE TESTING W REFLEX): HIV Screen 4th Generation wRfx: NONREACTIVE
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
## 2017-03-11 NOTE — Telephone Encounter (Signed)
Medication management - Fax received from patient's [employer] approving her for Latuda 20 mg tablets from 02/21/17 - 02/21/2018.

## 2017-03-15 ENCOUNTER — Ambulatory Visit (INDEPENDENT_AMBULATORY_CARE_PROVIDER_SITE_OTHER): Payer: BLUE CROSS/BLUE SHIELD | Admitting: Psychiatry

## 2017-03-15 VITALS — BP 142/76 | HR 67 | Ht 69.02 in | Wt 310.6 lb

## 2017-03-15 DIAGNOSIS — Z915 Personal history of self-harm: Secondary | ICD-10-CM

## 2017-03-15 DIAGNOSIS — Z79899 Other long term (current) drug therapy: Secondary | ICD-10-CM

## 2017-03-15 DIAGNOSIS — N183 Chronic kidney disease, stage 3 (moderate): Secondary | ICD-10-CM

## 2017-03-15 DIAGNOSIS — J45909 Unspecified asthma, uncomplicated: Secondary | ICD-10-CM

## 2017-03-15 DIAGNOSIS — F3181 Bipolar II disorder: Secondary | ICD-10-CM | POA: Diagnosis not present

## 2017-03-15 DIAGNOSIS — G4700 Insomnia, unspecified: Secondary | ICD-10-CM

## 2017-03-15 DIAGNOSIS — Z818 Family history of other mental and behavioral disorders: Secondary | ICD-10-CM | POA: Diagnosis not present

## 2017-03-15 DIAGNOSIS — M549 Dorsalgia, unspecified: Secondary | ICD-10-CM | POA: Diagnosis not present

## 2017-03-15 DIAGNOSIS — G8929 Other chronic pain: Secondary | ICD-10-CM | POA: Diagnosis not present

## 2017-03-15 DIAGNOSIS — E1122 Type 2 diabetes mellitus with diabetic chronic kidney disease: Secondary | ICD-10-CM

## 2017-03-15 DIAGNOSIS — Z87891 Personal history of nicotine dependence: Secondary | ICD-10-CM

## 2017-03-15 DIAGNOSIS — K589 Irritable bowel syndrome without diarrhea: Secondary | ICD-10-CM

## 2017-03-15 DIAGNOSIS — G4733 Obstructive sleep apnea (adult) (pediatric): Secondary | ICD-10-CM

## 2017-03-15 DIAGNOSIS — K219 Gastro-esophageal reflux disease without esophagitis: Secondary | ICD-10-CM | POA: Diagnosis not present

## 2017-03-15 MED ORDER — ZOLPIDEM TARTRATE 5 MG PO TABS: 2.5000 mg | ORAL_TABLET | Freq: Every evening | ORAL | 0 refills | Status: DC | PRN

## 2017-03-15 MED ORDER — DULOXETINE HCL 60 MG PO CPEP: 60.0000 mg | ORAL_CAPSULE | Freq: Two times a day (BID) | ORAL | 1 refills | Status: DC

## 2017-03-15 MED ORDER — LURASIDONE HCL 40 MG PO TABS: 40.0000 mg | ORAL_TABLET | Freq: Every day | ORAL | 1 refills | Status: DC

## 2017-03-15 MED ORDER — LAMOTRIGINE 200 MG PO TABS: 200.0000 mg | ORAL_TABLET | Freq: Every day | ORAL | 1 refills | Status: DC

## 2017-03-15 NOTE — Patient Instructions (Signed)
1. Continue lamotrigine 200 mg daily 2. Continue duloxetine 60 mg twice a day  3. Increase latuda 40 mg daily  4. Continue Ambien 2.5 mg at night as needed for sleep 5. Try melatonin 3 mg, two hours before going to bed 6. Return to clinic in one month

## 2017-04-07 ENCOUNTER — Ambulatory Visit (INDEPENDENT_AMBULATORY_CARE_PROVIDER_SITE_OTHER): Payer: BLUE CROSS/BLUE SHIELD | Admitting: Internal Medicine

## 2017-04-07 ENCOUNTER — Encounter: Payer: Self-pay | Admitting: Internal Medicine

## 2017-04-07 VITALS — BP 118/70 | HR 64 | Ht 69.0 in | Wt 316.0 lb

## 2017-04-07 DIAGNOSIS — G4733 Obstructive sleep apnea (adult) (pediatric): Secondary | ICD-10-CM

## 2017-04-07 DIAGNOSIS — R0689 Other abnormalities of breathing: Secondary | ICD-10-CM

## 2017-04-07 DIAGNOSIS — R918 Other nonspecific abnormal finding of lung field: Secondary | ICD-10-CM | POA: Diagnosis not present

## 2017-04-07 DIAGNOSIS — R0600 Dyspnea, unspecified: Secondary | ICD-10-CM

## 2017-04-07 DIAGNOSIS — R911 Solitary pulmonary nodule: Secondary | ICD-10-CM

## 2017-04-07 NOTE — Progress Notes (Signed)
Subjective:     Patient ID: Jasmine [name], female   DOB: [birth year], 46 y.o.   MRN: [id number]  HPI  HPI   Chief Complaint  Patient presents with  . Follow-up    OSA and Asthma     Referring provider: Aletha Halim., PA-C  HPI: 46 yo female former smoker with PMH of DM (Type I ) and Bipolar dz referred to our office for asth   TEST  Pulmonary function testing dated 05/01/2012 FEV1 2.8 L/82%. FVC 2.3 L/79%. Total lung capacity 4.75 L 82%. DLCO of 18.7 per/55%   07/27/2016 Follow Up : Referred to our office for asthma and OSA  Pt presents to our office after referral from PCP . She has recently had wheezing and shortness of breath . She says she was dx with asthma and started on proair.   She was having wheezing , dry cough and post nasal drip , throat clearing for last 3 months. . Was started on ProAir . She uses it 1-2 x day. W/ only minimal improvement .  She is former smoker , 1 PPD x 20 yr.   Last seen in our office 2015. Was seen in past for pulmonary infiltrates. Serial CT chest showed improvement with last CT chest in 2015 mild scarring in right lung.   Says she carries a dx of moderate OSA ~7 yr ago. Did not wear CPAP and had to return it due to insurance coverage. She wants to be checked again because she is having trouble sleeping, has snoring and significant daytime sleepiness.    OV 09/22/2016  Chief Complaint  Patient presents with  . Follow-up    Pt here after PFT and sleep study. Pt states her breathing is unchanged since last OV with TP on 2.20.18. Pt states she has a cough with intermittent mucus production with clear to yellow mucus and chest tightness. Pt denies f/c/s.    45 year old morbidly obese female with a prior history of sleep apnea and some interstitial infiltrates not otherwise specified. I personally not seen this patient in 3 years. She tells me that in the interim much of her shortness of breath and wheezing improved/resolved. Then in the  last few years or so it has come back and is slowly progressive and but now stabilized. It is mild to moderate in intensity. This associated wheezing that occurs randomly. There is exertional dyspnea relieved by rest. Is also some cough. In February 2018 she did see nurse practitioner and sleep apnea was confirmed. CPAP has been ordered and she is weaning for this. She is here for follow-up. Her pulmonary function tests is obscured by obesity is documented below. She also tells me that she was in the emergency room sitting when she went in for her diabetes and she was hypoxemic    Pulmonary function test 09/22/2016 shows mild restriction on spirometry but normal lung volume and mild reduction in DLCO consistent with obesity.  Venous blood gas 09/14/2016 shows pH 7.3 6742 and a PO2 of 49  Creatinine 09/14/2016 is elevated at 1.7 mg percent.   Exhaled nitric oxide today in our office :  20 pbb and normal  Walk test in office 09/22/2016  : As he walked 3 laps on room air: She desaturated down to 84% and started feeling dyspneic and wanted to stop. However we continued to push her and the pulse ox rebounded to 94% and she started feeling better.   OV 04/07/2017  Chief Complaint  Patient presents  with  . Follow-up    CT scan done 02/08/17.   Pt states that she still is becoming SOB that sometimes is with exertion but is becoming SOB at other times. Also has c/o cough with white to dark yellow mucus that is very thick; states that she had a URI and finished abx 10/27. Denies any CP.    Follow-up unexplained dyspnea  Last visit I had her referred to sleep specialist Dr. Elsworth Soho. He recommended CPAP but patient has been noncompliant because she says she's been removing the hospital for sleep. Therefore insurance companies asked her to return her CPAP machine. She continues to have dyspnea and dizziness with exertion. In 5 walking desaturation test 185 feet 3 laps on room ai She only walk 2 laps and  stopped because of dizziness. Resting heart rate jumped from 65-84. Pulse ox remained at 97%. She had more dizziness a little bit of dyspnea. She also has occasional wheezing and coughbut these are far less than or shortness of breath. She reports being on carvedilol for history of murmur but no murmur on exam  Follow-up lung nodules -She had a 6 month CT chest as documented below in the lung nodules are stable.    IMPRESSION:COMPARISON:  10/04/2016 1. Stable exam. Tiny bilateral pulmonary nodules are unchanged measuring up to a maximum of 6 mm in the right middle lobe. Repeat CT in 12-18 months recommended to reassess. Non-contrast chest CT at 3-6 months is recommended. This recommendation follows the consensus statement: Guidelines for Management of Incidental Pulmonary Nodules Detected on CT Images: From the Fleischner Society 2017; Radiology 2017; 284:228-243. 2. Stable appearance subtle mosaic attenuation suggesting air trapping. Small airways disease would be a consideration.   Electronically Signed   By: Misty Stanley M.D.   On: 02/08/2017 14:48   has a past medical history of Anxiety; Bipolar disorder ([place]); Chronic fatigue; CKD (chronic kidney disease), stage II; Depression; Diabetes mellitus; DKA, type 1 ([place]) (11/04/2011); Elevated cholesterol; Fibromyalgia; GERD (gastroesophageal reflux disease); Headache; Hyperlipemia; Hypertension; Hypothyroidism; IBS (irritable bowel syndrome); Obesity; Sleep apnea; Stress incontinence; Tachycardia; Tobacco abuse; and UTI (lower urinary tract infection).   reports that she quit smoking about 4 years ago. Her smoking use included Cigarettes. She has a 15.00 pack-year smoking history. She has never used smokeless tobacco.  Past Surgical History:  Procedure Laterality Date  . INCONTINENCE SURGERY    . NASAL FRACTURE SURGERY    . ovary removed    . PUBOVAGINAL SLING  08/16/2011   Procedure: [name];  Surgeon: Bernestine Amass, MD;   Location: [facility];  Service: Urology;  Laterality: N/A;         . UTERINE FIBROID SURGERY  2001    Allergies  Allergen Reactions  . Ciprofloxacin Swelling    Per pt caused lips swell and nauseous feeling  . Buspar [Buspirone] Other (See Comments)    abd cramping  . Advair Diskus [Fluticasone-Salmeterol] Other (See Comments)    Thrush   . Levaquin [Levofloxacin]     Per pt caused lips swell and nauseous feeling  . Biaxin [Clarithromycin] Rash  . Hydroxyzine Palpitations    other    Immunization History  Administered Date(s) Administered  . Influenza Split 04/07/2012, 04/07/2013  . Influenza,inj,Quad PF,6+ Mos 03/03/2009, 04/07/2012, 04/07/2013, 03/10/2016  . Influenza-Unspecified 03/10/2017  . Pneumococcal Polysaccharide-23 04/08/2011  . Pneumococcal-Unspecified 04/08/2011, 03/10/2017  . Tdap 04/21/2016    Family History  Problem Relation Age of Onset  . Asthma Mother   .  Bipolar disorder Mother   . Heart disease Father   . Lymphoma Father   . Hypertension Father   . Thyroid disease Father   . Hyperlipidemia Father   . Diabetes Father   . Cancer Paternal Grandmother        lung and breast  . Bladder Cancer Paternal Grandfather   . Suicidality Maternal Grandfather   . Thyroid disease Brother      Current Outpatient Prescriptions:  .  acetaminophen (TYLENOL) 500 MG tablet, Take 1,000 mg by mouth every 6 (six) hours as needed. For pain, Disp: , Rfl:  .  aspirin EC 81 MG tablet, Take 81 mg by mouth every evening. , Disp: , Rfl:  .  carvedilol (COREG) 25 MG tablet, Take 37.5 mg by mouth 2 (two) times daily with a meal. , Disp: , Rfl:  .  cholecalciferol (VITAMIN D) 1000 UNITS tablet, Take 1,000 Units by mouth daily., Disp: , Rfl:  .  diazepam (VALIUM) 5 MG tablet, Take 5 mg by mouth 2 (two) times daily as needed for anxiety or muscle spasms. , Disp: , Rfl:  .  DULoxetine (CYMBALTA) 60 MG capsule, Take 1 capsule (60 mg total) by mouth 2 (two) times daily., Disp: 60  capsule, Rfl: 1 .  fluticasone (FLONASE) 50 MCG/ACT nasal spray, Place 2 sprays into the nose daily as needed for allergies. , Disp: , Rfl:  .  folic acid (FOLVITE) 326 MCG tablet, Take 800 mcg by mouth daily., Disp: , Rfl:  .  furosemide (LASIX) 40 MG tablet, Take 40 mg by mouth daily. , Disp: , Rfl:  .  lamoTRIgine (LAMICTAL) 200 MG tablet, Take 1 tablet (200 mg total) by mouth daily., Disp: 30 tablet, Rfl: 1 .  levothyroxine (SYNTHROID, LEVOTHROID) 150 MCG tablet, Take 150 mcg by mouth daily before breakfast., Disp: , Rfl:  .  lubiprostone (AMITIZA) 24 MCG capsule, Take 24 mcg by mouth 2 (two) times daily with a meal. , Disp: , Rfl:  .  lurasidone (LATUDA) 40 MG TABS tablet, Take 1 tablet (40 mg total) by mouth daily with breakfast., Disp: 30 tablet, Rfl: 1 .  Melatonin 3 MG TABS, Take 3 mg by mouth., Disp: , Rfl:  .  NOVOLOG 100 UNIT/ML injection, USE AS DIRECTED FOR INSULIN PUMP MAX DAILY DOSE 120 UNITS PER DAY, Disp: , Rfl: 2 .  omeprazole (PRILOSEC) 20 MG capsule, Take 20 mg by mouth at bedtime., Disp: , Rfl:  .  ondansetron (ZOFRAN ODT) 4 MG disintegrating tablet, Take 1 tablet (4 mg total) by mouth every 8 (eight) hours as needed., Disp: 20 tablet, Rfl: 6 .  pravastatin (PRAVACHOL) 80 MG tablet, Take 80 mg by mouth at bedtime., Disp: , Rfl:  .  PROAIR HFA 108 (90 Base) MCG/ACT inhaler, INHALE TWO PUFFS INTO THE LUNGS EVERY 6 HOURS AS NEEDED FOR WHEEZING OR SHORTNESS OF BREATH, Disp: , Rfl: 1 .  rizatriptan (MAXALT-MLT) 10 MG disintegrating tablet, Take 1 tablet (10 mg total) by mouth as needed. May repeat in 2 hours if needed, Disp: 15 tablet, Rfl: 6 .  SALINE NASAL MIST NA, Place 1-2 sprays into the nose daily as needed (FOR CONGESTION). , Disp: , Rfl:  .  traMADol (ULTRAM) 50 MG tablet, Take 50 mg by mouth 2 (two) times daily as needed. , Disp: , Rfl:  .  zolpidem (AMBIEN) 5 MG tablet, Take 0.5 tablets (2.5 mg total) by mouth at bedtime as needed for sleep., Disp: 15 tablet, Rfl:  0  Review of Systems     Objective:   Physical Exam  Constitutional: She is oriented to person, place, and time. She appears well-developed and well-nourished. No distress.  HENT:  Head: Normocephalic and atraumatic.  Right Ear: External ear normal.  Left Ear: External ear normal.  Mouth/Throat: Oropharynx is clear and moist. No oropharyngeal exudate.  Eyes: Pupils are equal, round, and reactive to light. Conjunctivae and EOM are normal. Right eye exhibits no discharge. Left eye exhibits no discharge. No scleral icterus.  Neck: Normal range of motion. Neck supple. No JVD present. No tracheal deviation present. No thyromegaly present.  Cardiovascular: Normal rate, regular rhythm, normal heart sounds and intact distal pulses.  Exam reveals no gallop and no friction rub.   No murmur heard. Specifically I could not hear the murmur that she described  Pulmonary/Chest: Effort normal and breath sounds normal. No respiratory distress. She has no wheezes. She has no rales. She exhibits no tenderness.  Abdominal: Soft. Bowel sounds are normal. She exhibits no distension and no mass. There is no tenderness. There is no rebound and no guarding.  Musculoskeletal: Normal range of motion. She exhibits no edema or tenderness.  Lymphadenopathy:    She has no cervical adenopathy.  Neurological: She is alert and oriented to person, place, and time. She has normal reflexes. No cranial nerve deficit. She exhibits normal muscle tone. Coordination normal.  Skin: Skin is warm and dry. No rash noted. She is not diaphoretic. No erythema. No pallor.  Psychiatric: She has a normal mood and affect. Her behavior is normal. Judgment and thought content normal.  Vitals reviewed.   Vitals:   04/07/17 0939  BP: 118/70  Pulse: 64  SpO2: 94%  Weight: (!) 316 lb (143.3 kg)  Height: 5\' 9"  (1.753 m)    Estimated body mass index is 46.67 kg/m as calculated from the following:   Height as of this encounter: 5\' 9"   (1.753 m).   Weight as of this encounter: 316 lb (143.3 kg).      Assessment:       ICD-10-CM   1. Dyspnea and respiratory abnormalities R06.00    R06.89   2. OSA (obstructive sleep apnea) G47.33   3. Multiple lung nodules on CT R91.8        Plan:      Dyspnea and respiratory abnormalities - do echo - do cpst test on bike   OSA (obstructive sleep apnea) - per Dr Elsworth Soho =- too bad due to non-compliance you have to return your CPAP  Multiple lung nodules on CT - repeat CT chest without contrsat in 12 months  Followup  - after echo and bike stress test with Dr Chase Caller or APP   Dr. Brand Males, M.D., [facility].C.P Pulmonary and Critical Care Medicine Staff Physician [facility] Pager: [phone], If no answer or between  15:00h - 7:00h: call [phone]  04/07/2017 10:07 AM

## 2017-04-07 NOTE — Patient Instructions (Signed)
ICD-10-CM   1. Dyspnea and respiratory abnormalities R06.00    R06.89   2. OSA (obstructive sleep apnea) G47.33   3. Multiple lung nodules on CT R91.8    Dyspnea and respiratory abnormalities - do echo - do cpst test on bike   OSA (obstructive sleep apnea) - per Dr Elsworth Soho =- too bad due to non-compliance you have to return your CPAP  Multiple lung nodules on CT - repeat CT chest without contrsat in 12 months  Followup  - after echo and bike stress test with Dr Chase Caller or APP

## 2017-04-07 NOTE — Addendum Note (Signed)
Addended by: Lorretta Harp on: 04/07/2017 10:55 AM   Modules accepted: Orders

## 2017-04-07 NOTE — Addendum Note (Signed)
Addended by: Lorretta Harp on: 04/07/2017 10:19 AM   Modules accepted: Orders

## 2017-04-11 ENCOUNTER — Ambulatory Visit (HOSPITAL_COMMUNITY): Payer: BLUE CROSS/BLUE SHIELD | Attending: Cardiology

## 2017-04-11 ENCOUNTER — Other Ambulatory Visit: Payer: Self-pay

## 2017-04-11 DIAGNOSIS — E119 Type 2 diabetes mellitus without complications: Secondary | ICD-10-CM | POA: Diagnosis not present

## 2017-04-11 DIAGNOSIS — Z6841 Body Mass Index (BMI) 40.0 and over, adult: Secondary | ICD-10-CM | POA: Diagnosis not present

## 2017-04-11 DIAGNOSIS — E785 Hyperlipidemia, unspecified: Secondary | ICD-10-CM | POA: Insufficient documentation

## 2017-04-11 DIAGNOSIS — R0600 Dyspnea, unspecified: Secondary | ICD-10-CM | POA: Diagnosis not present

## 2017-04-11 DIAGNOSIS — I10 Essential (primary) hypertension: Secondary | ICD-10-CM | POA: Insufficient documentation

## 2017-04-11 DIAGNOSIS — R0689 Other abnormalities of breathing: Secondary | ICD-10-CM | POA: Diagnosis not present

## 2017-04-12 ENCOUNTER — Encounter (HOSPITAL_COMMUNITY): Payer: Self-pay | Admitting: Psychiatry

## 2017-04-12 ENCOUNTER — Ambulatory Visit (INDEPENDENT_AMBULATORY_CARE_PROVIDER_SITE_OTHER): Payer: BLUE CROSS/BLUE SHIELD | Admitting: Psychiatry

## 2017-04-12 VITALS — BP 128/88 | HR 77 | Wt 314.0 lb

## 2017-04-12 DIAGNOSIS — F3181 Bipolar II disorder: Secondary | ICD-10-CM | POA: Diagnosis not present

## 2017-04-12 DIAGNOSIS — R5383 Other fatigue: Secondary | ICD-10-CM

## 2017-04-12 DIAGNOSIS — Z975 Presence of (intrauterine) contraceptive device: Secondary | ICD-10-CM | POA: Diagnosis not present

## 2017-04-12 DIAGNOSIS — Z818 Family history of other mental and behavioral disorders: Secondary | ICD-10-CM

## 2017-04-12 DIAGNOSIS — E785 Hyperlipidemia, unspecified: Secondary | ICD-10-CM | POA: Diagnosis not present

## 2017-04-12 DIAGNOSIS — Z87891 Personal history of nicotine dependence: Secondary | ICD-10-CM

## 2017-04-12 DIAGNOSIS — N183 Chronic kidney disease, stage 3 (moderate): Secondary | ICD-10-CM | POA: Diagnosis not present

## 2017-04-12 DIAGNOSIS — K589 Irritable bowel syndrome without diarrhea: Secondary | ICD-10-CM

## 2017-04-12 DIAGNOSIS — R450 Nervousness: Secondary | ICD-10-CM | POA: Diagnosis not present

## 2017-04-12 DIAGNOSIS — F419 Anxiety disorder, unspecified: Secondary | ICD-10-CM | POA: Diagnosis not present

## 2017-04-12 DIAGNOSIS — E108 Type 1 diabetes mellitus with unspecified complications: Secondary | ICD-10-CM | POA: Diagnosis not present

## 2017-04-12 DIAGNOSIS — G4700 Insomnia, unspecified: Secondary | ICD-10-CM

## 2017-04-12 DIAGNOSIS — K219 Gastro-esophageal reflux disease without esophagitis: Secondary | ICD-10-CM

## 2017-04-12 DIAGNOSIS — G4710 Hypersomnia, unspecified: Secondary | ICD-10-CM

## 2017-04-12 DIAGNOSIS — J45909 Unspecified asthma, uncomplicated: Secondary | ICD-10-CM

## 2017-04-12 NOTE — Patient Instructions (Signed)
1. Continue lamotrigine 200 mg daily  2, Continue duloxetine 60 mg twice a day  3. Continue latuda 40 mg daily  4. Continue Ambien 2.5 mg at night as needed for sleep 5. Continue melatonin 3 mg at night as needed 6. Return to clinic in one month
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
## 2017-04-14 ENCOUNTER — Ambulatory Visit (HOSPITAL_COMMUNITY): Payer: Self-pay | Admitting: Psychiatry

## 2017-04-15 ENCOUNTER — Emergency Department (HOSPITAL_COMMUNITY)
Admission: EM | Admit: 2017-04-15 | Discharge: 2017-04-15 | Disposition: A | Discharge status: Home / Self Care | Payer: BLUE CROSS/BLUE SHIELD | Attending: Emergency Medicine | Admitting: Emergency Medicine

## 2017-04-15 ENCOUNTER — Other Ambulatory Visit: Payer: Self-pay

## 2017-04-15 ENCOUNTER — Encounter (HOSPITAL_COMMUNITY): Payer: Self-pay | Admitting: *Deleted

## 2017-04-15 DIAGNOSIS — M5430 Sciatica, unspecified side: Secondary | ICD-10-CM

## 2017-04-15 DIAGNOSIS — E785 Hyperlipidemia, unspecified: Secondary | ICD-10-CM | POA: Insufficient documentation

## 2017-04-15 DIAGNOSIS — N183 Chronic kidney disease, stage 3 (moderate): Secondary | ICD-10-CM | POA: Diagnosis not present

## 2017-04-15 DIAGNOSIS — Z87891 Personal history of nicotine dependence: Secondary | ICD-10-CM | POA: Insufficient documentation

## 2017-04-15 DIAGNOSIS — E039 Hypothyroidism, unspecified: Secondary | ICD-10-CM | POA: Insufficient documentation

## 2017-04-15 DIAGNOSIS — M5431 Sciatica, right side: Secondary | ICD-10-CM | POA: Diagnosis not present

## 2017-04-15 DIAGNOSIS — Z7982 Long term (current) use of aspirin: Secondary | ICD-10-CM | POA: Insufficient documentation

## 2017-04-15 DIAGNOSIS — I129 Hypertensive chronic kidney disease with stage 1 through stage 4 chronic kidney disease, or unspecified chronic kidney disease: Secondary | ICD-10-CM | POA: Diagnosis not present

## 2017-04-15 DIAGNOSIS — E109 Type 1 diabetes mellitus without complications: Secondary | ICD-10-CM | POA: Insufficient documentation

## 2017-04-15 DIAGNOSIS — Z794 Long term (current) use of insulin: Secondary | ICD-10-CM | POA: Diagnosis not present

## 2017-04-15 DIAGNOSIS — Z79899 Other long term (current) drug therapy: Secondary | ICD-10-CM | POA: Insufficient documentation

## 2017-04-15 DIAGNOSIS — M25551 Pain in right hip: Secondary | ICD-10-CM | POA: Diagnosis present

## 2017-04-15 MED ORDER — ONDANSETRON HCL 4 MG/2ML IJ SOLN
4.0000 mg | Freq: Once | INTRAMUSCULAR | Status: AC
  Administered 2017-04-15: 4 mg via INTRAMUSCULAR
  Filled 2017-04-15: qty 2

## 2017-04-15 MED ORDER — OXYCODONE-ACETAMINOPHEN 5-325 MG PO TABS: 2.0000 | ORAL_TABLET | ORAL | 0 refills | Status: DC | PRN

## 2017-04-15 MED ORDER — HYDROMORPHONE HCL 2 MG/ML IJ SOLN
2.0000 mg | Freq: Once | INTRAMUSCULAR | Status: AC
  Administered 2017-04-15: 2 mg via INTRAMUSCULAR
  Filled 2017-04-15: qty 1

## 2017-04-15 NOTE — ED Provider Notes (Signed)
[facility] EMERGENCY DEPARTMENT Provider Note   CSN: [id number] Arrival date & time: 04/15/17  2110     History   Chief Complaint Chief Complaint  Patient presents with  . Hip Pain    HPI Jasmine [name] is a 46 y.o. female.  The history is provided by the patient. No language interpreter was used.  Hip Pain  This is a new problem. The problem occurs constantly. The problem has been gradually worsening. Pertinent negatives include no chest pain. Nothing aggravates the symptoms. Nothing relieves the symptoms. She has tried nothing for the symptoms. The treatment provided no relief.   Pt reports she has bursitis in her right hip and sciatica.  Pt reports increased pain tonight.  Pt reports no relief with home medications.  Pt has Stage 3 renal failure and diabetes.  Pt reports she was hospitalized the last time she had prednisone. Past Medical History:  Diagnosis Date  . Anxiety   . Bipolar disorder ([place])   . Chronic fatigue   . CKD (chronic kidney disease), stage II   . Depression   . Diabetes mellitus   . DKA, type 1 ([place]) 11/04/2011  . Elevated cholesterol   . Fibromyalgia   . GERD (gastroesophageal reflux disease)   . Headache   . Hyperlipemia   . Hypertension   . Hypothyroidism   . IBS (irritable bowel syndrome)   . Obesity   . Sleep apnea    HAS C -PAP / DOES NOT USE  . Stress incontinence    Pt had surgery to correct this.  . Tachycardia   . Tobacco abuse   . UTI (lower urinary tract infection)     Patient Active Problem List   Diagnosis Date Noted  . Chronic migraine 01/17/2017  . Diabetic peripheral neuropathy ([place]) 01/17/2017  . OSA (obstructive sleep apnea) 07/27/2016  . Wheezing 07/27/2016  . Hyperglycemia 12/25/2012  . Acute on chronic renal failure ([place]) 12/25/2012  . Pulmonary infiltrates 10/02/2012  . Postnasal drip 10/02/2012  . Hyperkalemia 09/25/2012  . Chronic kidney disease, stage III (moderate) ([place]) 09/25/2012  . Morbid obesity  ([place]) 09/24/2012  . HTN (hypertension) 09/24/2012  . Bipolar II disorder ([place]) 09/24/2012  . URI (upper respiratory infection) 09/24/2012  . DKA, type 1 ([place]) 11/04/2011  . Gastroenteritis 11/03/2011  . DM (diabetes mellitus), type 1, uncontrolled ([place]) 11/03/2011  . Hyponatremia 11/03/2011  . CKD (chronic kidney disease), stage II 11/03/2011  . Elevated lipase 11/03/2011  . Hypothyroidism 11/03/2011  . Tobacco abuse 11/03/2011  . SUI (stress urinary incontinence, female) 08/16/2011    Past Surgical History:  Procedure Laterality Date  . INCONTINENCE SURGERY    . NASAL FRACTURE SURGERY    . ovary removed    . UTERINE FIBROID SURGERY  2001    OB History    Gravida Para Term Preterm AB Living   1             SAB TAB Ectopic Multiple Live Births                   Home Medications    Prior to Admission medications   Medication Sig Start Date End Date Taking? Authorizing Provider  acetaminophen (TYLENOL) 500 MG tablet Take 1,000 mg by mouth every 6 (six) hours as needed. For pain    [provider]  aspirin EC 81 MG tablet Take 81 mg by mouth every evening.     [provider]  carvedilol (COREG) 25 MG  tablet Take 37.5 mg by mouth 2 (two) times daily with a meal.     [provider]  cholecalciferol (VITAMIN D) 1000 UNITS tablet Take 1,000 Units by mouth daily.    [provider]  diazepam (VALIUM) 5 MG tablet Take 5 mg by mouth 2 (two) times daily as needed for anxiety or muscle spasms.     [provider]  DULoxetine (CYMBALTA) 60 MG capsule Take 1 capsule (60 mg total) by mouth 2 (two) times daily. 03/15/17   Norman Clay, MD  fluticasone (FLONASE) 50 MCG/ACT nasal spray Place 2 sprays into the nose daily as needed for allergies.     [provider]  folic acid (FOLVITE) 856 MCG tablet Take 800 mcg by mouth daily.    [provider]  furosemide (LASIX) 40 MG tablet Take 40 mg by mouth daily.     [provider]  lamoTRIgine (LAMICTAL) 200 MG tablet Take 1 tablet (200 mg total) by mouth daily. 03/15/17   Norman Clay, MD  levothyroxine (SYNTHROID, LEVOTHROID) 150 MCG tablet Take 150 mcg by mouth daily before breakfast.    [provider]  lubiprostone (AMITIZA) 24 MCG capsule Take 24 mcg by mouth 2 (two) times daily with a meal.     [provider]  lurasidone (LATUDA) 40 MG TABS tablet Take 1 tablet (40 mg total) by mouth daily with breakfast. 03/15/17   Norman Clay, MD  Melatonin 3 MG TABS Take 3 mg by mouth.    [provider]  NOVOLOG 100 UNIT/ML injection USE AS DIRECTED FOR INSULIN PUMP MAX DAILY DOSE 120 UNITS PER DAY 02/24/17   [provider]  omeprazole (PRILOSEC) 20 MG capsule Take 20 mg by mouth at bedtime.    [provider]  ondansetron (ZOFRAN ODT) 4 MG disintegrating tablet Take 1 tablet (4 mg total) by mouth every 8 (eight) hours as needed. 01/17/17   Marcial Pacas, MD  oxyCODONE-acetaminophen (PERCOCET/ROXICET) 5-325 MG tablet Take 2 tablets every 4 (four) hours as needed by mouth for severe pain. 04/15/17   Fransico Meadow, PA-C  pravastatin (PRAVACHOL) 80 MG tablet Take 80 mg by mouth at bedtime.    [provider]  PROAIR HFA 108 (90 Base) MCG/ACT inhaler INHALE TWO PUFFS INTO THE LUNGS EVERY 6 HOURS AS NEEDED FOR WHEEZING OR SHORTNESS OF BREATH 12/30/16   [provider]  rizatriptan (MAXALT-MLT) 10 MG disintegrating tablet Take 1 tablet (10 mg total) by mouth as needed. May repeat in 2 hours if needed 01/17/17   Marcial Pacas, MD  SALINE NASAL MIST NA Place 1-2 sprays into the nose daily as needed (FOR CONGESTION).     [provider]  traMADol (ULTRAM) 50 MG tablet Take 50 mg by mouth 2 (two) times daily as needed.     [provider]  zolpidem (AMBIEN) 5 MG tablet Take 0.5 tablets (2.5 mg total) by mouth at bedtime as needed for sleep. 03/15/17   Norman Clay, MD    Family History Family History    Problem Relation Age of Onset  . Asthma Mother   . Bipolar disorder Mother   . Heart disease Father   . Lymphoma Father   . Hypertension Father   . Thyroid disease Father   . Hyperlipidemia Father   . Diabetes Father   . Cancer Paternal Grandmother        lung and breast  . Bladder Cancer Paternal Grandfather   . Suicidality Maternal Grandfather   .  Thyroid disease Brother     Social History Social History   Tobacco Use  . Smoking status: Former Smoker    Packs/day: 0.75    Years: 20.00    Pack years: 15.00    Types: Cigarettes    Last attempt to quit: 06/07/2012    Years since quitting: 4.8  . Smokeless tobacco: Never Used  Substance Use Topics  . Alcohol use: No  . Drug use: No     Allergies   Ciprofloxacin; Buspar [buspirone]; Advair diskus [fluticasone-salmeterol]; Levaquin [levofloxacin]; Biaxin [clarithromycin]; and Hydroxyzine   Review of Systems Review of Systems  Cardiovascular: Negative for chest pain.  All other systems reviewed and are negative.    Physical Exam Updated Vital Signs BP (!) 166/89 (BP Location: Right Arm)   Pulse 71   Temp 98.6 F (37 C) (Oral)   Resp 19   Ht 5\' 9"  (1.753 m)   Wt (!) 142.4 kg (314 lb)   LMP 03/15/2017   SpO2 94%   BMI 46.37 kg/m   Physical Exam  Constitutional: She is oriented to person, place, and time. She appears well-developed and well-nourished.  HENT:  Head: Normocephalic.  Eyes: EOM are normal.  Neck: Normal range of motion.  Pulmonary/Chest: Effort normal.  Abdominal: She exhibits no distension.  Musculoskeletal: Normal range of motion.  Tender right hip and right sciatic area,  Pain with movement.    Neurological: She is alert and oriented to person, place, and time.  Psychiatric: She has a normal mood and affect.  Nursing note and vitals reviewed.    ED Treatments / Results  Labs (all labs ordered are listed, but only abnormal results are displayed) Labs Reviewed - No data to  display  EKG  EKG Interpretation None       Radiology No results found.  Procedures Procedures (including critical care time)  Medications Ordered in ED Medications  HYDROmorphone (DILAUDID) injection 2 mg (not administered)  ondansetron (ZOFRAN) injection 4 mg (not administered)     Initial Impression / Assessment and Plan / ED Course  I have reviewed the triage vital signs and the nursing notes.  Pertinent labs & imaging results that were available during my care of the patient were reviewed by me and considered in my medical decision making (see chart for details).     Pt advised to see her Md on Monday.  Limit treatment options due to renal disease and diabetes.  Pt advised to discuss physical therapy with her MD  Final Clinical Impressions(s) / ED Diagnoses   Final diagnoses:  Sciatica, unspecified laterality    ED Discharge Orders        Ordered    oxyCODONE-acetaminophen (PERCOCET/ROXICET) 5-325 MG tablet  Every 4 hours PRN     04/15/17 2233       Sidney Ace 04/15/17 2304    Virgel Manifold, MD 04/19/17 1237

## 2017-04-15 NOTE — ED Triage Notes (Signed)
Pt reports right hip and calf pain x 2 weeks. Pt is not getting any relief with her prescribed pain and muscle relaxants or otc medications.

## 2017-04-15 NOTE — Discharge Instructions (Signed)
Return if any problems.  See your Physician for recheck on Monday  

## 2017-04-20 ENCOUNTER — Ambulatory Visit (INDEPENDENT_AMBULATORY_CARE_PROVIDER_SITE_OTHER): Payer: BLUE CROSS/BLUE SHIELD | Admitting: Neurology

## 2017-04-20 ENCOUNTER — Encounter: Payer: Self-pay | Admitting: Neurology

## 2017-04-20 VITALS — BP 149/82 | HR 65 | Ht 69.0 in | Wt 315.0 lb

## 2017-04-20 DIAGNOSIS — G43709 Chronic migraine without aura, not intractable, without status migrainosus: Secondary | ICD-10-CM | POA: Diagnosis not present

## 2017-04-20 DIAGNOSIS — IMO0002 Reserved for concepts with insufficient information to code with codable children: Secondary | ICD-10-CM

## 2017-04-20 MED ORDER — RIZATRIPTAN BENZOATE 10 MG PO TBDP: 10.0000 mg | ORAL_TABLET | ORAL | 11 refills | Status: DC | PRN
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
## 2017-04-25 ENCOUNTER — Ambulatory Visit: Payer: Self-pay | Admitting: Pulmonary Disease

## 2017-04-26 ENCOUNTER — Telehealth (HOSPITAL_COMMUNITY): Payer: Self-pay | Admitting: *Deleted

## 2017-04-26 ENCOUNTER — Encounter (HOSPITAL_COMMUNITY): Payer: Self-pay

## 2017-04-26 NOTE — Telephone Encounter (Signed)
Called patient regarding missed appointment for CPX (Cardiopulmonary exercise test) at [facility] on 11/20 at 2pm. Left message with return contact information for patient to return call to reschedule appointment ASAP.    Landis Martins, MS, ACSM-RCEP 04/26/2017 2:40 PM

## 2017-04-30 ENCOUNTER — Emergency Department (HOSPITAL_COMMUNITY)
Admission: EM | Admit: 2017-04-30 | Discharge: 2017-04-30 | Disposition: A | Discharge status: Home / Self Care | Payer: BLUE CROSS/BLUE SHIELD | Attending: Emergency Medicine | Admitting: Emergency Medicine

## 2017-04-30 ENCOUNTER — Encounter (HOSPITAL_COMMUNITY): Payer: Self-pay | Admitting: Emergency Medicine

## 2017-04-30 DIAGNOSIS — Z87891 Personal history of nicotine dependence: Secondary | ICD-10-CM | POA: Insufficient documentation

## 2017-04-30 DIAGNOSIS — Z79899 Other long term (current) drug therapy: Secondary | ICD-10-CM | POA: Diagnosis not present

## 2017-04-30 DIAGNOSIS — E1065 Type 1 diabetes mellitus with hyperglycemia: Secondary | ICD-10-CM | POA: Insufficient documentation

## 2017-04-30 DIAGNOSIS — Z7902 Long term (current) use of antithrombotics/antiplatelets: Secondary | ICD-10-CM | POA: Insufficient documentation

## 2017-04-30 DIAGNOSIS — Z7982 Long term (current) use of aspirin: Secondary | ICD-10-CM | POA: Diagnosis not present

## 2017-04-30 DIAGNOSIS — E1022 Type 1 diabetes mellitus with diabetic chronic kidney disease: Secondary | ICD-10-CM | POA: Insufficient documentation

## 2017-04-30 DIAGNOSIS — R739 Hyperglycemia, unspecified: Secondary | ICD-10-CM | POA: Diagnosis present

## 2017-04-30 DIAGNOSIS — Z794 Long term (current) use of insulin: Secondary | ICD-10-CM | POA: Diagnosis not present

## 2017-04-30 DIAGNOSIS — I129 Hypertensive chronic kidney disease with stage 1 through stage 4 chronic kidney disease, or unspecified chronic kidney disease: Secondary | ICD-10-CM | POA: Diagnosis not present

## 2017-04-30 DIAGNOSIS — N183 Chronic kidney disease, stage 3 (moderate): Secondary | ICD-10-CM | POA: Diagnosis not present

## 2017-04-30 DIAGNOSIS — E039 Hypothyroidism, unspecified: Secondary | ICD-10-CM | POA: Diagnosis not present

## 2017-04-30 LAB — COMPREHENSIVE METABOLIC PANEL
ALT: 18 U/L (ref 14–54)
AST: 23 U/L (ref 15–41)
Albumin: 3.5 g/dL (ref 3.5–5.0)
Alkaline Phosphatase: 113 U/L (ref 38–126)
Anion gap: 10 (ref 5–15)
BUN: 29 mg/dL — ABNORMAL HIGH (ref 6–20)
CHLORIDE: 93 mmol/L — AB (ref 101–111)
CO2: 26 mmol/L (ref 22–32)
CREATININE: 2.13 mg/dL — AB (ref 0.44–1.00)
Calcium: 9.7 mg/dL (ref 8.9–10.3)
GFR calc non Af Amer: 27 mL/min — ABNORMAL LOW (ref >60)
GFR, EST AFRICAN AMERICAN: 31 mL/min — AB (ref >60)
Glucose, Bld: 470 mg/dL — ABNORMAL HIGH (ref 65–99)
Potassium: 4.5 mmol/L (ref 3.5–5.1)
SODIUM: 129 mmol/L — AB (ref 135–145)
Total Bilirubin: 0.7 mg/dL (ref 0.3–1.2)
Total Protein: 7.1 g/dL (ref 6.5–8.1)

## 2017-04-30 LAB — CBC WITH DIFFERENTIAL/PLATELET
Basophils Absolute: 0.1 10*3/uL (ref 0.0–0.1)
Basophils Relative: 1 %
Eosinophils Absolute: 0.6 10*3/uL (ref 0.0–0.7)
Eosinophils Relative: 7 %
HEMATOCRIT: 43.1 % (ref 36.0–46.0)
HEMOGLOBIN: 13.3 g/dL (ref 12.0–15.0)
LYMPHS ABS: 1.4 10*3/uL (ref 0.7–4.0)
Lymphocytes Relative: 18 %
MCH: 28.4 pg (ref 26.0–34.0)
MCHC: 30.9 g/dL (ref 30.0–36.0)
MCV: 91.9 fL (ref 78.0–100.0)
MONOS PCT: 6 %
Monocytes Absolute: 0.5 10*3/uL (ref 0.1–1.0)
NEUTROS ABS: 5.4 10*3/uL (ref 1.7–7.7)
NEUTROS PCT: 68 %
Platelets: 260 10*3/uL (ref 150–400)
RBC: 4.69 MIL/uL (ref 3.87–5.11)
RDW: 13.7 % (ref 11.5–15.5)
WBC: 7.9 10*3/uL (ref 4.0–10.5)

## 2017-04-30 LAB — BLOOD GAS, VENOUS
Acid-Base Excess: 1.6 mmol/L (ref 0.0–2.0)
BICARBONATE: 23.8 mmol/L (ref 20.0–28.0)
FIO2: 21
O2 Saturation: 63.6 %
PCO2 VEN: 47.8 mmHg (ref 44.0–60.0)
PH VEN: 7.362 (ref 7.250–7.430)
PO2 VEN: 37.5 mmHg (ref 32.0–45.0)

## 2017-04-30 LAB — URINALYSIS, ROUTINE W REFLEX MICROSCOPIC
BACTERIA UA: NONE SEEN
Bilirubin Urine: NEGATIVE
Glucose, UA: >=500 mg/dL — AB
HGB URINE DIPSTICK: NEGATIVE
Ketones, ur: 5 mg/dL — AB
Leukocytes, UA: NEGATIVE
Nitrite: NEGATIVE
PROTEIN: NEGATIVE mg/dL
Specific Gravity, Urine: 1.008 (ref 1.005–1.030)
pH: 6 (ref 5.0–8.0)

## 2017-04-30 LAB — PREGNANCY, URINE: Preg Test, Ur: NEGATIVE

## 2017-04-30 LAB — CBG MONITORING, ED
Glucose-Capillary: 456 mg/dL — ABNORMAL HIGH (ref 65–99)
Glucose-Capillary: 221 mg/dL — ABNORMAL HIGH (ref 65–99)

## 2017-04-30 MED ORDER — INSULIN ASPART 100 UNIT/ML ~~LOC~~ SOLN
10.0000 [IU] | Freq: Once | SUBCUTANEOUS | Status: AC
  Administered 2017-04-30: 10 [IU] via INTRAVENOUS
  Filled 2017-04-30: qty 1

## 2017-04-30 MED ORDER — SODIUM CHLORIDE 0.9 % IV BOLUS (SEPSIS)
1000.0000 mL | Freq: Once | INTRAVENOUS | Status: AC
  Administered 2017-04-30: 1000 mL via INTRAVENOUS

## 2017-04-30 NOTE — Discharge Instructions (Signed)
As discussed, your evaluation today has been largely reassuring.  But, it is important that you monitor your condition carefully, and do not hesitate to return to the ED if you develop new, or concerning changes in your condition. ? ?Otherwise, please follow-up with your physician for appropriate ongoing care. ? ?

## 2017-04-30 NOTE — ED Triage Notes (Signed)
Pt cbg at home was in the 400's at 0700 am this morning.  Pt has insulin pump that says it has given her 37units since midnight.  Pt reports having nausea.

## 2017-04-30 NOTE — ED Notes (Signed)
Pt reports after triage she has been having some chest pressure

## 2017-04-30 NOTE — ED Provider Notes (Signed)
[facility] EMERGENCY DEPARTMENT Provider Note   CSN: [id number] Arrival date & time: 04/30/17  1103     History   Chief Complaint Chief Complaint  Patient presents with  . Hyperglycemia    HPI Jasmine [name] is a 46 y.o. female.  HPI Patient presents with concern of fatigue, anorexia, nausea, hyperglycemia. Onset seemingly about 2 days ago, the symptoms are worse over the past day. No vomiting, no confusion, no disorientation, no fever, no chills. Patient notes that she has multiple medical issues including dependent diabetes, has an insulin pump in place. She states the pump continues to work appropriately, as far as she knows.  At the very end of the evaluation the patient states that she has had some chest pressure as well, this is a seemingly new, she denies associated dyspnea or syncope.  Past Medical History:  Diagnosis Date  . Anxiety   . Bipolar disorder ([place])   . Chronic fatigue   . CKD (chronic kidney disease), stage II   . Depression   . Diabetes mellitus   . DKA, type 1 ([place]) 11/04/2011  . Elevated cholesterol   . Fibromyalgia   . GERD (gastroesophageal reflux disease)   . Headache   . Hyperlipemia   . Hypertension   . Hypothyroidism   . IBS (irritable bowel syndrome)   . Obesity   . Sleep apnea    HAS C -PAP / DOES NOT USE  . Stress incontinence    Pt had surgery to correct this.  . Tachycardia   . Tobacco abuse   . UTI (lower urinary tract infection)     Patient Active Problem List   Diagnosis Date Noted  . Chronic migraine 01/17/2017  . Diabetic peripheral neuropathy ([place]) 01/17/2017  . OSA (obstructive sleep apnea) 07/27/2016  . Wheezing 07/27/2016  . Hyperglycemia 12/25/2012  . Acute on chronic renal failure ([place]) 12/25/2012  . Pulmonary infiltrates 10/02/2012  . Postnasal drip 10/02/2012  . Hyperkalemia 09/25/2012  . Chronic kidney disease, stage III (moderate) ([place]) 09/25/2012  . Morbid obesity ([place]) 09/24/2012  . HTN  (hypertension) 09/24/2012  . Bipolar II disorder ([place]) 09/24/2012  . URI (upper respiratory infection) 09/24/2012  . DKA, type 1 ([place]) 11/04/2011  . Gastroenteritis 11/03/2011  . DM (diabetes mellitus), type 1, uncontrolled ([place]) 11/03/2011  . Hyponatremia 11/03/2011  . CKD (chronic kidney disease), stage II 11/03/2011  . Elevated lipase 11/03/2011  . Hypothyroidism 11/03/2011  . Tobacco abuse 11/03/2011  . SUI (stress urinary incontinence, female) 08/16/2011    Past Surgical History:  Procedure Laterality Date  . INCONTINENCE SURGERY    . NASAL FRACTURE SURGERY    . ovary removed    . OVARY SURGERY    . PUBOVAGINAL SLING  08/16/2011   Procedure: [name];  Surgeon: Bernestine Amass, MD;  Location: [facility] ORS;  Service: Urology;  Laterality: N/A;         . UTERINE FIBROID SURGERY  2001    OB History    Gravida Para Term Preterm AB Living   1             SAB TAB Ectopic Multiple Live Births                   Home Medications    Prior to Admission medications   Medication Sig Start Date End Date Taking? Authorizing Provider  acetaminophen (TYLENOL) 500 MG tablet Take 1,000 mg by mouth every 6 (six) hours as needed. For pain  Yes [provider]  aspirin EC 81 MG tablet Take 81 mg by mouth every evening.    Yes [provider]  carvedilol (COREG) 25 MG tablet Take 37.5 mg by mouth 2 (two) times daily with a meal.    Yes [provider]  cholecalciferol (VITAMIN D) 1000 UNITS tablet Take 1,000 Units by mouth daily.   Yes [provider]  diazepam (VALIUM) 5 MG tablet Take 5 mg by mouth 2 (two) times daily as needed for anxiety or muscle spasms.    Yes [provider]  DULoxetine (CYMBALTA) 60 MG capsule Take 1 capsule (60 mg total) by mouth 2 (two) times daily. 03/15/17  Yes Hisada, Elie Goody, MD  fluticasone (FLONASE) 50 MCG/ACT nasal spray Place 2 sprays into the nose daily as needed for allergies.    Yes [provider]  folic acid (FOLVITE) 941 MCG tablet Take 800 mcg by mouth daily.   Yes [provider]  furosemide (LASIX) 40 MG tablet Take 40 mg by mouth daily.    Yes [provider]  lamoTRIgine (LAMICTAL) 200 MG tablet Take 1 tablet (200 mg total) by mouth daily. 03/15/17  Yes Hisada, Elie Goody, MD  levothyroxine (SYNTHROID, LEVOTHROID) 150 MCG tablet Take 150 mcg by mouth daily before breakfast.   Yes [provider]  lubiprostone (AMITIZA) 24 MCG capsule Take 24 mcg by mouth 2 (two) times daily with a meal.    Yes [provider]  lurasidone (LATUDA) 40 MG TABS tablet Take 1 tablet (40 mg total) by mouth daily with breakfast. 03/15/17  Yes Hisada, Elie Goody, MD  Melatonin 3 MG TABS Take 3 mg by mouth.   Yes [provider]  NOVOLOG 100 UNIT/ML injection USE AS DIRECTED FOR INSULIN PUMP MAX DAILY DOSE 120 UNITS PER DAY 02/24/17  Yes [provider]  omeprazole (PRILOSEC) 20 MG capsule Take 20 mg by mouth at bedtime.   Yes [provider]  ondansetron (ZOFRAN ODT) 4 MG disintegrating tablet Take 1 tablet (4 mg total) by mouth every 8 (eight) hours as needed. 01/17/17  Yes Marcial Pacas, MD  pravastatin (PRAVACHOL) 80 MG tablet Take 80 mg by mouth at bedtime.   Yes [provider]  PROAIR HFA 108 (90 Base) MCG/ACT inhaler INHALE TWO PUFFS INTO THE LUNGS EVERY 6 HOURS AS NEEDED FOR WHEEZING OR SHORTNESS OF BREATH 12/30/16  Yes [provider]  rizatriptan (MAXALT-MLT) 10 MG disintegrating tablet Take 1 tablet (10 mg total) as needed by mouth. May repeat in 2 hours if needed 04/20/17  Yes Marcial Pacas, MD  SALINE NASAL MIST NA Place 1-2 sprays into the nose daily as needed (FOR CONGESTION).    Yes [provider]  traMADol (ULTRAM) 50 MG tablet Take 50 mg by mouth 2 (two) times daily as needed.    Yes [provider]  zolpidem (AMBIEN) 5 MG tablet Take 0.5 tablets (2.5 mg total) by mouth at bedtime as needed for sleep. 03/15/17   Yes Norman Clay, MD  oxyCODONE-acetaminophen (PERCOCET/ROXICET) 5-325 MG tablet Take 2 tablets every 4 (four) hours as needed by mouth for severe pain. Patient not taking: Reported on 04/30/2017 04/15/17   Sidney Ace    Family History Family History  Problem Relation Age of Onset  . Asthma Mother   . Bipolar disorder Mother   . Heart disease Father   . Lymphoma Father   . Hypertension Father   . Thyroid disease Father   . Hyperlipidemia Father   .  Diabetes Father   . Cancer Paternal Grandmother        lung and breast  . Bladder Cancer Paternal Grandfather   . Suicidality Maternal Grandfather   . Thyroid disease Brother     Social History Social History   Tobacco Use  . Smoking status: Former Smoker    Packs/day: 0.75    Years: 20.00    Pack years: 15.00    Types: Cigarettes    Last attempt to quit: 06/07/2012    Years since quitting: 4.8  . Smokeless tobacco: Never Used  Substance Use Topics  . Alcohol use: No  . Drug use: No     Allergies   Ciprofloxacin; Buspar [buspirone]; Advair diskus [fluticasone-salmeterol]; Levaquin [levofloxacin]; Biaxin [clarithromycin]; and Hydroxyzine   Review of Systems Review of Systems  Constitutional:       Per HPI, otherwise negative  HENT:       Per HPI, otherwise negative  Respiratory:       Per HPI, otherwise negative  Cardiovascular:       Per HPI, otherwise negative  Gastrointestinal: Positive for nausea. Negative for vomiting.  Endocrine:       Negative aside from HPI  Genitourinary:       Neg aside from HPI   Musculoskeletal:       Per HPI, otherwise negative  Skin: Negative.   Neurological: Negative for syncope.     Physical Exam Updated Vital Signs BP (!) 111/56   Pulse 69   Temp 98 F (36.7 C) (Oral)   Resp 15   Ht 5\' 9"  (1.753 m)   Wt (!) 142.9 kg (315 lb)   LMP 03/09/2017 Comment: periods are irregular   SpO2 95%   BMI 46.52 kg/m   Physical Exam  Constitutional: She is oriented to  person, place, and time. She appears well-developed and well-nourished. No distress.  Obese female awake and alert answering questions appropriately  HENT:  Head: Normocephalic and atraumatic.  Eyes: Conjunctivae and EOM are normal.  Cardiovascular: Normal rate and regular rhythm.  Pulmonary/Chest: Effort normal and breath sounds normal. No stridor. No respiratory distress.  Abdominal: She exhibits no distension.  Musculoskeletal: She exhibits no edema.  Neurological: She is alert and oriented to person, place, and time. No cranial nerve deficit.  Skin: Skin is warm and dry.  Psychiatric: She has a normal mood and affect.  Nursing note and vitals reviewed.    ED Treatments / Results  Labs (all labs ordered are listed, but only abnormal results are displayed) Labs Reviewed  URINALYSIS, ROUTINE W REFLEX MICROSCOPIC - Abnormal; Notable for the following components:      Result Value   APPearance HAZY (*)    Glucose, UA >=500 (*)    Ketones, ur 5 (*)    Squamous Epithelial / LPF 0-5 (*)    All other components within normal limits  COMPREHENSIVE METABOLIC PANEL - Abnormal; Notable for the following components:   Sodium 129 (*)    Chloride 93 (*)    Glucose, Bld 470 (*)    BUN 29 (*)    Creatinine, Ser 2.13 (*)    GFR calc non Af Amer 27 (*)    GFR calc Af Amer 31 (*)    All other components within normal limits  CBG MONITORING, ED - Abnormal; Notable for the following components:   Glucose-Capillary 456 (*)    All other components within normal limits  CBG MONITORING, ED - Abnormal; Notable for the following components:  Glucose-Capillary 221 (*)    All other components within normal limits  BLOOD GAS, VENOUS  CBC WITH DIFFERENTIAL/PLATELET  PREGNANCY, URINE    EKG  EKG Interpretation  Date/Time:  Saturday April 30 2017 11:30:23 EST Ventricular Rate:  74 PR Interval:    QRS Duration: 104 QT Interval:  403 QTC Calculation: 448 R Axis:   -10 Text  Interpretation:  Sinus rhythm Low voltage, precordial leads T wave abnormality No significant change since last tracing Abnormal ekg Confirmed by Carmin Muskrat [phone]) on 04/30/2017 1:07:32 PM      Procedures Procedures (including critical care time)  Medications Ordered in ED Medications  sodium chloride 0.9 % bolus 1,000 mL (0 mLs Intravenous Stopped 04/30/17 1317)  sodium chloride 0.9 % bolus 1,000 mL (0 mLs Intravenous Stopped 04/30/17 1413)  insulin aspart (novoLOG) injection 10 Units (10 Units Intravenous Given 04/30/17 1317)     Initial Impression / Assessment and Plan / ED Course  I have reviewed the triage vital signs and the nursing notes.  Pertinent labs & imaging results that were available during my care of the patient were reviewed by me and considered in my medical decision making (see chart for details).  2:15 PM Alcohol following 2 L fluid resuscitation, 10 units of insulin, the patient's glucose is less than 300, she feels better, looks better, will be discharged to follow-up with primary care. No evidence for DKA or other decompensated state.  Patient did describe some chest pressure, but no evidence for ongoing ischemia.  Final Clinical Impressions(s) / ED Diagnoses   Final diagnoses:  Hyperglycemia     Carmin Muskrat, MD 04/30/17 1501

## 2017-05-03 ENCOUNTER — Ambulatory Visit: Payer: Self-pay | Admitting: Internal Medicine
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
## 2017-05-12 ENCOUNTER — Encounter (HOSPITAL_COMMUNITY): Payer: Self-pay | Admitting: Psychiatry

## 2017-05-12 ENCOUNTER — Ambulatory Visit (INDEPENDENT_AMBULATORY_CARE_PROVIDER_SITE_OTHER): Payer: BLUE CROSS/BLUE SHIELD | Admitting: Psychiatry

## 2017-05-12 VITALS — BP 138/88 | HR 72 | Ht 69.0 in | Wt 310.0 lb

## 2017-05-12 DIAGNOSIS — R450 Nervousness: Secondary | ICD-10-CM

## 2017-05-12 DIAGNOSIS — Z818 Family history of other mental and behavioral disorders: Secondary | ICD-10-CM

## 2017-05-12 DIAGNOSIS — K589 Irritable bowel syndrome without diarrhea: Secondary | ICD-10-CM

## 2017-05-12 DIAGNOSIS — N183 Chronic kidney disease, stage 3 (moderate): Secondary | ICD-10-CM

## 2017-05-12 DIAGNOSIS — F419 Anxiety disorder, unspecified: Secondary | ICD-10-CM | POA: Diagnosis not present

## 2017-05-12 DIAGNOSIS — F3181 Bipolar II disorder: Secondary | ICD-10-CM | POA: Diagnosis not present

## 2017-05-12 DIAGNOSIS — J45909 Unspecified asthma, uncomplicated: Secondary | ICD-10-CM

## 2017-05-12 DIAGNOSIS — E1022 Type 1 diabetes mellitus with diabetic chronic kidney disease: Secondary | ICD-10-CM

## 2017-05-12 DIAGNOSIS — G4700 Insomnia, unspecified: Secondary | ICD-10-CM | POA: Diagnosis not present

## 2017-05-12 DIAGNOSIS — Z87891 Personal history of nicotine dependence: Secondary | ICD-10-CM | POA: Diagnosis not present

## 2017-05-12 DIAGNOSIS — K219 Gastro-esophageal reflux disease without esophagitis: Secondary | ICD-10-CM

## 2017-05-12 MED ORDER — LAMOTRIGINE 200 MG PO TABS: 200.0000 mg | ORAL_TABLET | Freq: Every day | ORAL | 1 refills | Status: DC

## 2017-05-12 MED ORDER — DULOXETINE HCL 60 MG PO CPEP: 60.0000 mg | ORAL_CAPSULE | Freq: Two times a day (BID) | ORAL | 1 refills | Status: DC

## 2017-05-12 MED ORDER — ZOLPIDEM TARTRATE 5 MG PO TABS: 2.5000 mg | ORAL_TABLET | Freq: Every evening | ORAL | 0 refills | Status: DC | PRN

## 2017-05-12 MED ORDER — LURASIDONE HCL 40 MG PO TABS: 40.0000 mg | ORAL_TABLET | Freq: Every day | ORAL | 1 refills | Status: DC

## 2017-05-12 NOTE — Patient Instructions (Addendum)
1. Continue lamotrigine 200 mg daily  2, Continue duloxetine 60 mg twice a day  3. Continue latuda 40 mg daily  4. Continue Ambien 2.5 mg at night as needed for sleep  5. Continue melatonin 3 mg at night as needed 6. Return to clinic in one month for 15 mins

## 2017-05-19 ENCOUNTER — Encounter (HOSPITAL_COMMUNITY): Payer: Self-pay

## 2017-05-20 ENCOUNTER — Encounter: Payer: Self-pay | Admitting: Physical Therapy

## 2017-05-20 ENCOUNTER — Other Ambulatory Visit: Payer: Self-pay

## 2017-05-20 ENCOUNTER — Ambulatory Visit: Payer: BLUE CROSS/BLUE SHIELD | Attending: Chiropractic Medicine | Admitting: Physical Therapy

## 2017-05-20 DIAGNOSIS — M5416 Radiculopathy, lumbar region: Secondary | ICD-10-CM | POA: Diagnosis present

## 2017-05-20 DIAGNOSIS — G8929 Other chronic pain: Secondary | ICD-10-CM | POA: Diagnosis present

## 2017-05-20 DIAGNOSIS — M5441 Lumbago with sciatica, right side: Secondary | ICD-10-CM | POA: Insufficient documentation

## 2017-05-20 NOTE — Therapy (Addendum)
[facility] [place], [place], [address] Phone: [phone]   Fax:  [phone]  Physical Therapy Evaluation  Patient Details  Name: Jasmine [name] MRN: [id number] Date of Birth: Nov 01, 1970 Referring Provider: Levy Pupa PA-C   Encounter Date: 05/20/2017  PT End of Session - 05/20/17 1047    Visit Number  1    Number of Visits  16    Date for PT Re-Evaluation  07/15/17    Authorization Type  GCODE 10TH VISIT    PT Start Time  0347    PT Stop Time  1129    PT Time Calculation (min)  46 min       Past Medical History:  Diagnosis Date  . Anxiety   . Bipolar disorder ([place])   . Chronic fatigue   . CKD (chronic kidney disease), stage II   . Depression   . Diabetes mellitus   . DKA, type 1 ([place]) 11/04/2011  . Elevated cholesterol   . Fibromyalgia   . GERD (gastroesophageal reflux disease)   . Headache   . Hyperlipemia   . Hypertension   . Hypothyroidism   . IBS (irritable bowel syndrome)   . Obesity   . Sleep apnea    HAS C -PAP / DOES NOT USE  . Stress incontinence    Pt had surgery to correct this.  . Tachycardia   . Tobacco abuse   . UTI (lower urinary tract infection)     Past Surgical History:  Procedure Laterality Date  . INCONTINENCE SURGERY    . NASAL FRACTURE SURGERY    . ovary removed    . OVARY SURGERY    . PUBOVAGINAL SLING  08/16/2011   Procedure: [name];  Surgeon: Bernestine Amass, MD;  Location: [facility];  Service: Urology;  Laterality: N/A;         . UTERINE FIBROID SURGERY  2001    There were no vitals filed for this visit.   Subjective Assessment - 05/20/17 1048    Subjective  Patient began having sciatica pain in November and lasted for weeks. The sciatic pain has resolved mostly, but when she walks for a while she still feels cramping in her R ankle and she has back pain.     Pertinent History  HTN, DM, taccycardia/bradycardia, Kidney disease    How long can you  stand comfortably?  5 min    How long can you walk comfortably?  30 min    Diagnostic tests  xray some degeneration in lower vertebra    Patient Stated Goals  be able to stand longer, decrease pain    Currently in Pain?  Yes    Pain Score  2     Pain Location  Back    Pain Orientation  Lower    Pain Descriptors / Indicators  Stabbing    Pain Type  Chronic pain    Pain Radiating Towards  to RLE to ankle    Pain Onset  More than a month ago    Pain Frequency  Intermittent    Aggravating Factors   bending (washing machine, dishes at sink)    Pain Relieving Factors  lying on her back or side with knees bent, moist heat         [facility] PT Assessment - 05/20/17 0001      Assessment   Referring Provider  Levy Pupa PA-C    Onset Date/Surgical Date  04/21/17    Next MD  Visit  06/06/17      Precautions   Precautions  Other (comment)    Precaution Comments  Insulin pump; blood sugar monitor on R biceps; Paper Tape Allergy      Restrictions   Weight Bearing Restrictions  No      Balance Screen   Has the patient fallen in the past 6 months  Yes    How many times?  1    Has the patient had a decrease in activity level because of a fear of falling?   No    Is the patient reluctant to leave their home because of a fear of falling?   No      Prior Function   Level of Independence  Independent    Vocation  On disability      Posture/Postural Control   Posture Comments  Increased lumbar lordosis      ROM / Strength   AROM / PROM / Strength  AROM;Strength      AROM   Overall AROM Comments  Lumbar ROM WNL      Strength   Overall Strength Comments  B hip flex 4+/5, L hip ABD 4+/5 else 5/5      Flexibility   Hamstrings  mild tightness B HS    Piriformis  L piriformis      Palpation   Palpation comment  unremarkable      Special Tests    Special Tests  Lumbar    Lumbar Tests  Slump Test      Slump test   Findings  Positive    Side  Right    Comment  and Left mild,  resolves with head in neutral             Objective measurements completed on examination: See above findings.      [facility] Adult PT Treatment/Exercise - 05/20/17 0001      Modalities   Modalities  Electrical Stimulation;Moist Heat      Moist Heat Therapy   Number Minutes Moist Heat  15 Minutes    Moist Heat Location  Lumbar Spine      Electrical Stimulation   Electrical Stimulation Location  B lumbar premod 80-150 Hz x 15 min    Electrical Stimulation Goals  Pain                  PT Long Term Goals - 05/20/17 1228      PT LONG TERM GOAL #1   Title  patient to be independent with HEP    Time  8    Period  Weeks    Status  New      PT LONG TERM GOAL #2   Title  Patient able to stand to do chores such as dishes with 2/10 pain or less.    Time  8    Period  Weeks    Status  New             Plan - 05/20/17 1220    Clinical Impression Statement  Patient presents today for a low complexity evaluation for low back pain with R sided sciatica. She has full lumbar ROM and good overall strength, however she has pain with standing and walking. Patient will benefit from lumbar and core strenghthening to address pain as well as ADL modification education.Marland Kitchen    History and Personal Factors relevant to plan of care:  DM, HTN, tacchycardia/bradycardia, insulin pump    Clinical Presentation  Evolving  Clinical Presentation due to:  inconsistent symptoms    Clinical Decision Making  Low    Rehab Potential  Good    PT Frequency  2x / week    PT Duration  8 weeks    PT Treatment/Interventions  ADLs/Self Care Home Management;Electrical Stimulation;Moist Heat;Traction;Ultrasound;Therapeutic activities;Therapeutic exercise;Neuromuscular re-education;Manual techniques;Dry needling;Patient/family education;Taping    PT Next Visit Plan  Issue HEP core stabilization; DN as needed; nerve glides LLE;  traction at 40% body weight possibly.    Consulted and Agree with Plan of  Care  Patient       Patient will benefit from skilled therapeutic intervention in order to improve the following deficits and impairments:  Pain, Decreased strength, Postural dysfunction, Impaired flexibility  Visit Diagnosis: Chronic right-sided low back pain with right-sided sciatica - Plan: PT plan of care cert/re-cert  G-Codes - 46/56/81 1230    Functional Assessment Tool Used (Outpatient Only)  Clinical judgement    Functional Limitation  Other PT primary    Other PT Primary Current Status (E7517)  At least 20 percent but less than 40 percent impaired, limited or restricted    Other PT Primary Goal Status (G0174)  At least 1 percent but less than 20 percent impaired, limited or restricted        Problem List Patient Active Problem List   Diagnosis Date Noted  . Chronic migraine 01/17/2017  . Diabetic peripheral neuropathy ([place]) 01/17/2017  . OSA (obstructive sleep apnea) 07/27/2016  . Wheezing 07/27/2016  . Hyperglycemia 12/25/2012  . Acute on chronic renal failure ([place]) 12/25/2012  . Pulmonary infiltrates 10/02/2012  . Postnasal drip 10/02/2012  . Hyperkalemia 09/25/2012  . Chronic kidney disease, stage III (moderate) ([place]) 09/25/2012  . Morbid obesity ([place]) 09/24/2012  . HTN (hypertension) 09/24/2012  . Bipolar II disorder ([place]) 09/24/2012  . URI (upper respiratory infection) 09/24/2012  . DKA, type 1 ([place]) 11/04/2011  . Gastroenteritis 11/03/2011  . DM (diabetes mellitus), type 1, uncontrolled ([place]) 11/03/2011  . Hyponatremia 11/03/2011  . CKD (chronic kidney disease), stage II 11/03/2011  . Elevated lipase 11/03/2011  . Hypothyroidism 11/03/2011  . Tobacco abuse 11/03/2011  . SUI (stress urinary incontinence, female) 08/16/2011    Madelyn Flavors PT 05/20/2017, 12:37 PM  [facility] [place], [place], [address] Phone: [phone]   Fax:  [phone]  Name: Jasmine [name] MRN: [id number] Date of  Birth: [birth year]

## 2017-05-23 ENCOUNTER — Ambulatory Visit: Payer: BLUE CROSS/BLUE SHIELD | Admitting: Physical Therapy

## 2017-05-23 ENCOUNTER — Encounter: Payer: Self-pay | Admitting: Physical Therapy

## 2017-05-23 DIAGNOSIS — G8929 Other chronic pain: Secondary | ICD-10-CM

## 2017-05-23 DIAGNOSIS — M5441 Lumbago with sciatica, right side: Secondary | ICD-10-CM | POA: Diagnosis not present

## 2017-05-23 NOTE — Patient Instructions (Signed)
Pelvic Tilt: Posterior - Legs Bent (Supine)   Tighten stomach and flatten back by rolling pelvis down. Hold _10___ seconds. Relax. Repeat _10-30___ times per set. Do __2__ sets per session. Do _2___ sessions per day.   . Straight Leg Raise   Tighten stomach and slowly raise locked right leg __4__ inches from floor. Repeat __10-30__ times per set. Do __2__ sets per session. Do __2__ sessions per day.    Bent Leg Lift (hook-Lying)   Tighten stomach and slowly raise right leg _5___ inches from floor. Keep trunk rigid. Hold _3___ seconds. Repeat _10___ times per set. Do ___2-3_ sets per session. Do __2__ sessions per day.  Brushing Teeth    Place one foot on ledge and one hand on counter. Bend other knee slightly to keep back straight.  Copyright  VHI. All rights reserved.  Refrigerator   Squat with knees apart to reach lower shelves and drawers.   Copyright  VHI. All rights reserved.  Laundry [employer] down and hold basket close to stand. Use leg muscles to do the work.   Copyright  VHI. All rights reserved.  Housework - Vacuuming   Hold the vacuum with arm held at side. Step back and forth to move it, keeping head up. Avoid twisting.   Copyright  VHI. All rights reserved.  Housework - Wiping   Position yourself as close as possible to reach work surface. Avoid straining your back.   Copyright  VHI. All rights reserved.  Gardening - Mowing   Keep arms close to sides and walk with lawn mower.   Copyright  VHI. All rights reserved.  Sleeping on Side   Place pillow between knees. Use cervical support under neck and a roll around waist as needed.   Copyright  VHI. All rights reserved.  Log Roll   Lying on back, bend left knee and place left arm across chest. Roll all in one movement to the right. Reverse to roll to the left. Always move as one unit.   Copyright  VHI. All rights reserved.  Stand to Sit / Sit to Stand   To sit: Bend  knees to lower self onto front edge of chair, then scoot back on seat. To stand: Reverse sequence by placing one foot forward, and scoot to front of seat. Use rocking motion to stand up.  Copyright  VHI. All rights reserved.  Posture - Standing   Good posture is important. Avoid slouching and forward head thrust. Maintain curve in low back and align ears over shoul- ders, hips over ankles.   Copyright  VHI. All rights reserved.  Posture - Sitting   Sit upright, head facing forward. Try using a roll to support lower back. Keep shoulders relaxed, and avoid rounded back. Keep hips level with knees. Avoid crossing legs for long periods.   Copyright  VHI. All rights reserved.  Computer Work   Position work to Programmer, multimedia. Use proper work and seat height. Keep shoulders back and down, wrists straight, and elbows at right angles. Use chair that provides full back support. Add footrest and lumbar roll as needed.   Copyright  VHI. All rights reserved.

## 2017-05-23 NOTE — Therapy (Signed)
[facility] [place], [place], [address] Phone: [phone]   Fax:  [phone]  Physical Therapy Treatment  Patient Details  Name: Jasmine [name] MRN: [id number] Date of Birth: 11-09-1970 Referring Provider: Levy Pupa PA-C   Encounter Date: 05/23/2017  PT End of Session - 05/23/17 1454    Visit Number  2    Number of Visits  16    Date for PT Re-Evaluation  07/15/17    Authorization Type  GCODE 10TH VISIT    PT Start Time  1430    PT Stop Time  1511    PT Time Calculation (min)  41 min    Activity Tolerance  Patient tolerated treatment well    Behavior During Therapy  [facility] for tasks assessed/performed       Past Medical History:  Diagnosis Date  . Anxiety   . Bipolar disorder ([place])   . Chronic fatigue   . CKD (chronic kidney disease), stage II   . Depression   . Diabetes mellitus   . DKA, type 1 ([place]) 11/04/2011  . Elevated cholesterol   . Fibromyalgia   . GERD (gastroesophageal reflux disease)   . Headache   . Hyperlipemia   . Hypertension   . Hypothyroidism   . IBS (irritable bowel syndrome)   . Obesity   . Sleep apnea    HAS C -PAP / DOES NOT USE  . Stress incontinence    Pt had surgery to correct this.  . Tachycardia   . Tobacco abuse   . UTI (lower urinary tract infection)     Past Surgical History:  Procedure Laterality Date  . INCONTINENCE SURGERY    . NASAL FRACTURE SURGERY    . ovary removed    . OVARY SURGERY    . PUBOVAGINAL SLING  08/16/2011   Procedure: [name];  Surgeon: Bernestine Amass, MD;  Location: [facility];  Service: Urology;  Laterality: N/A;         . UTERINE FIBROID SURGERY  2001    There were no vitals filed for this visit.  Subjective Assessment - 05/23/17 1431    Subjective  Patient reported ongoing pain in back    Pertinent History  HTN, DM, taccycardia/bradycardia, Kidney disease    How long can you stand comfortably?  5 min    How long can you  walk comfortably?  30 min    Diagnostic tests  xray some degeneration in lower vertebra    Patient Stated Goals  be able to stand longer, decrease pain    Currently in Pain?  Yes    Pain Score  2     Pain Location  Back    Pain Orientation  Lower    Pain Descriptors / Indicators  Discomfort;Stabbing    Pain Type  Chronic pain    Pain Onset  More than a month ago    Pain Frequency  Constant    Aggravating Factors   bending    Pain Relieving Factors  rest                      [facility] Adult PT Treatment/Exercise - 05/23/17 0001      Self-Care   Self-Care  ADL's;Lifting;Posture;Other Self-Care Comments    Other Self-Care Comments   HEP provided for all posture awareness techniques      Exercises   Exercises  Lumbar      Lumbar Exercises: Standing   Other Standing  Lumbar Exercises  pink XTS for retraction and lat pull x20 each      Lumbar Exercises: Supine   Ab Set  20 reps;3 seconds    Glut Set  20 reps;3 seconds    Bent Knee Raise  20 reps;3 seconds    Bridge  20 reps;3 seconds    Straight Leg Raise  20 reps;3 seconds    Other Supine Lumbar Exercises  RLE nerve glides      Moist Heat Therapy   Number Minutes Moist Heat  15 Minutes    Moist Heat Location  Lumbar Spine      Electrical Stimulation   Electrical Stimulation Location  B lumbar premod 80-150 Hz x 15 min    Electrical Stimulation Goals  Pain             PT Education - 05/23/17 1451    Education provided  Yes    Education Details  HEP posture awareness techniques and core activation    Person(s) Educated  Patient    Methods  Explanation;Demonstration;Handout    Comprehension  Verbalized understanding;Returned demonstration          PT Long Term Goals - 05/23/17 1455      PT LONG TERM GOAL #1   Title  patient to be independent with HEP    Time  8    Period  Weeks    Status  On-going      PT LONG TERM GOAL #2   Title  Patient able to stand to do chores such as dishes with 2/10  pain or less.    Time  8    Period  Weeks    Status  On-going            Plan - 05/23/17 1456    Clinical Impression Statement  Patient tolerated treatment well today. Patient was educated on posture awareness techniques and core activation with HEP provided. Patient has reported increased pain with ADL's and bending and relief with rest. Patient has good understanding of core strengtheing today and progression as tolerated. Patient goals ongoing.    Rehab Potential  Good    PT Frequency  2x / week    PT Duration  8 weeks    PT Treatment/Interventions  ADLs/Self Care Home Management;Electrical Stimulation;Moist Heat;Traction;Ultrasound;Therapeutic activities;Therapeutic exercise;Neuromuscular re-education;Manual techniques;Dry needling;Patient/family education;Taping    PT Next Visit Plan  cont with POC for core progression/Nustep; DN as needed; nerve glides RLE;  traction at 40% body weight possibly.    Consulted and Agree with Plan of Care  Patient       Patient will benefit from skilled therapeutic intervention in order to improve the following deficits and impairments:  Pain, Decreased strength, Postural dysfunction, Impaired flexibility  Visit Diagnosis: Chronic right-sided low back pain with right-sided sciatica     Problem List Patient Active Problem List   Diagnosis Date Noted  . Chronic migraine 01/17/2017  . Diabetic peripheral neuropathy ([place]) 01/17/2017  . OSA (obstructive sleep apnea) 07/27/2016  . Wheezing 07/27/2016  . Hyperglycemia 12/25/2012  . Acute on chronic renal failure ([place]) 12/25/2012  . Pulmonary infiltrates 10/02/2012  . Postnasal drip 10/02/2012  . Hyperkalemia 09/25/2012  . Chronic kidney disease, stage III (moderate) ([place]) 09/25/2012  . Morbid obesity ([place]) 09/24/2012  . HTN (hypertension) 09/24/2012  . Bipolar II disorder ([place]) 09/24/2012  . URI (upper respiratory infection) 09/24/2012  . DKA, type 1 ([place]) 11/04/2011  . Gastroenteritis  11/03/2011  . DM (diabetes mellitus), type 1,  uncontrolled ([place]) 11/03/2011  . Hyponatremia 11/03/2011  . CKD (chronic kidney disease), stage II 11/03/2011  . Elevated lipase 11/03/2011  . Hypothyroidism 11/03/2011  . Tobacco abuse 11/03/2011  . SUI (stress urinary incontinence, female) 08/16/2011    Phillips Climes, PTA 05/23/2017, 3:11 PM  [facility] [place], [place], [address] Phone: [phone]   Fax:  [phone]  Name: Jasmine [name] MRN: [id number] Date of Birth: [birth year]

## 2017-05-25 ENCOUNTER — Encounter: Payer: Self-pay | Admitting: Physical Therapy

## 2017-06-01 ENCOUNTER — Ambulatory Visit: Payer: BLUE CROSS/BLUE SHIELD | Admitting: Physical Therapy

## 2017-06-01 DIAGNOSIS — M5441 Lumbago with sciatica, right side: Secondary | ICD-10-CM | POA: Diagnosis not present

## 2017-06-01 DIAGNOSIS — M5416 Radiculopathy, lumbar region: Secondary | ICD-10-CM

## 2017-06-01 DIAGNOSIS — G8929 Other chronic pain: Secondary | ICD-10-CM

## 2017-06-01 NOTE — Therapy (Signed)
[facility] [place], [place], [address] Phone: [phone]   Fax:  [phone]  Physical Therapy Treatment  Patient Details  Name: Jasmine [name] MRN: [id number] Date of Birth: 05-13-1971 Referring Provider: Levy Pupa PA-C   Encounter Date: 06/01/2017  PT End of Session - 06/01/17 1256    Visit Number  3    Number of Visits  16    Date for PT Re-Evaluation  07/15/17    Authorization Type  GCODE 10TH VISIT    PT Start Time  1230    PT Stop Time  1313    PT Time Calculation (min)  43 min    Activity Tolerance  Patient tolerated treatment well    Behavior During Therapy  [facility] for tasks assessed/performed       Past Medical History:  Diagnosis Date  . Anxiety   . Bipolar disorder ([place])   . Chronic fatigue   . CKD (chronic kidney disease), stage II   . Depression   . Diabetes mellitus   . DKA, type 1 ([place]) 11/04/2011  . Elevated cholesterol   . Fibromyalgia   . GERD (gastroesophageal reflux disease)   . Headache   . Hyperlipemia   . Hypertension   . Hypothyroidism   . IBS (irritable bowel syndrome)   . Obesity   . Sleep apnea    HAS C -PAP / DOES NOT USE  . Stress incontinence    Pt had surgery to correct this.  . Tachycardia   . Tobacco abuse   . UTI (lower urinary tract infection)     Past Surgical History:  Procedure Laterality Date  . INCONTINENCE SURGERY    . NASAL FRACTURE SURGERY    . ovary removed    . OVARY SURGERY    . PUBOVAGINAL SLING  08/16/2011   Procedure: [name];  Surgeon: Bernestine Amass, MD;  Location: [facility];  Service: Urology;  Laterality: N/A;         . UTERINE FIBROID SURGERY  2001    There were no vitals filed for this visit.  Subjective Assessment - 06/01/17 1231    Subjective  ongoing pain complaints with oher medical issues    Pertinent History  HTN, DM, taccycardia/bradycardia, Kidney disease    How long can you stand comfortably?  5 min    How  long can you walk comfortably?  30 min    Diagnostic tests  xray some degeneration in lower vertebra    Patient Stated Goals  be able to stand longer, decrease pain    Currently in Pain?  Yes    Pain Score  4     Pain Location  Back    Pain Orientation  Lower    Pain Descriptors / Indicators  Discomfort    Pain Type  Chronic pain    Pain Onset  More than a month ago    Pain Frequency  Constant    Aggravating Factors   bending or prolong activity    Pain Relieving Factors  at rest                      [facility] Adult PT Treatment/Exercise - 06/01/17 0001      Lumbar Exercises: Aerobic   Stationary Bike  NuStep L4 x10 min Posture/Core focus      Lumbar Exercises: Standing   Other Standing Lumbar Exercises  pink XTS for retraction and lat pull x20 each  Lumbar Exercises: Supine   Ab Set  20 reps;3 seconds    Glut Set  20 reps;3 seconds    Bent Knee Raise  20 reps;3 seconds    Bridge  20 reps;3 seconds with hip abd red t-band    Straight Leg Raise  20 reps;3 seconds    Other Supine Lumbar Exercises  RLE nerve glides      Moist Heat Therapy   Number Minutes Moist Heat  15 Minutes    Moist Heat Location  Lumbar Spine      Electrical Stimulation   Electrical Stimulation Location  B lumbar premod 80-150 Hz x 15 min    Electrical Stimulation Goals  Pain                  PT Long Term Goals - 05/23/17 1455      PT LONG TERM GOAL #1   Title  patient to be independent with HEP    Time  8    Period  Weeks    Status  On-going      PT LONG TERM GOAL #2   Title  Patient able to stand to do chores such as dishes with 2/10 pain or less.    Time  8    Period  Weeks    Status  On-going            Plan - 06/01/17 1259    Clinical Impression Statement  Patient tolerated treatment well today. Patient able to progress with exercises today with no complaints. Patient has some increased pain today due to increased activity over weekend. Goals progressing  due to pain deficts.     Clinical Presentation  Stable    Rehab Potential  Good    PT Frequency  2x / week    PT Duration  8 weeks    PT Treatment/Interventions  ADLs/Self Care Home Management;Electrical Stimulation;Moist Heat;Traction;Ultrasound;Therapeutic activities;Therapeutic exercise;Neuromuscular re-education;Manual techniques;Dry needling;Patient/family education;Taping    PT Next Visit Plan  cont with POC for core progression/Nustep; DN as needed; nerve glides RLE;  traction at 40% body weight possibly.    Consulted and Agree with Plan of Care  Patient       Patient will benefit from skilled therapeutic intervention in order to improve the following deficits and impairments:  Pain, Decreased strength, Postural dysfunction, Impaired flexibility  Visit Diagnosis: Chronic right-sided low back pain with right-sided sciatica  Radiculopathy, lumbar region     Problem List Patient Active Problem List   Diagnosis Date Noted  . Chronic migraine 01/17/2017  . Diabetic peripheral neuropathy ([place]) 01/17/2017  . OSA (obstructive sleep apnea) 07/27/2016  . Wheezing 07/27/2016  . Hyperglycemia 12/25/2012  . Acute on chronic renal failure ([place]) 12/25/2012  . Pulmonary infiltrates 10/02/2012  . Postnasal drip 10/02/2012  . Hyperkalemia 09/25/2012  . Chronic kidney disease, stage III (moderate) ([place]) 09/25/2012  . Morbid obesity ([place]) 09/24/2012  . HTN (hypertension) 09/24/2012  . Bipolar II disorder ([place]) 09/24/2012  . URI (upper respiratory infection) 09/24/2012  . DKA, type 1 ([place]) 11/04/2011  . Gastroenteritis 11/03/2011  . DM (diabetes mellitus), type 1, uncontrolled ([place]) 11/03/2011  . Hyponatremia 11/03/2011  . CKD (chronic kidney disease), stage II 11/03/2011  . Elevated lipase 11/03/2011  . Hypothyroidism 11/03/2011  . Tobacco abuse 11/03/2011  . SUI (stress urinary incontinence, female) 08/16/2011    Phillips Climes, PTA 06/01/2017, 1:17 PM  [facility] [address] [place], [place], [address] Phone: [phone]  Fax:  [phone]  Name: Jasmine [name] MRN: [id number] Date of Birth: 03/28/1971

## 2017-06-08 ENCOUNTER — Encounter: Payer: Self-pay | Admitting: Physical Therapy

## 2017-06-08 ENCOUNTER — Ambulatory Visit: Payer: BLUE CROSS/BLUE SHIELD | Attending: Chiropractic Medicine | Admitting: Physical Therapy

## 2017-06-08 DIAGNOSIS — M5416 Radiculopathy, lumbar region: Secondary | ICD-10-CM | POA: Diagnosis present

## 2017-06-08 DIAGNOSIS — M5441 Lumbago with sciatica, right side: Secondary | ICD-10-CM | POA: Diagnosis not present

## 2017-06-08 DIAGNOSIS — G8929 Other chronic pain: Secondary | ICD-10-CM | POA: Diagnosis present

## 2017-06-08 NOTE — Therapy (Signed)
[facility] [place], [place], [address] Phone: [phone]   Fax:  [phone]  Physical Therapy Treatment  Patient Details  Name: Jasmine [name] MRN: [id number] Date of Birth: [birth year] Referring Provider: Levy Pupa PA-C   Encounter Date: 06/08/2017  PT End of Session - 06/08/17 1248    Visit Number  4    Number of Visits  16    Date for PT Re-Evaluation  07/15/17    Authorization Type  GCODE 10TH VISIT    PT Start Time  1229    PT Stop Time  1314    PT Time Calculation (min)  45 min    Activity Tolerance  Patient tolerated treatment well    Behavior During Therapy  [facility] for tasks assessed/performed       Past Medical History:  Diagnosis Date  . Anxiety   . Bipolar disorder ([place])   . Chronic fatigue   . CKD (chronic kidney disease), stage II   . Depression   . Diabetes mellitus   . DKA, type 1 ([place]) 11/04/2011  . Elevated cholesterol   . Fibromyalgia   . GERD (gastroesophageal reflux disease)   . Headache   . Hyperlipemia   . Hypertension   . Hypothyroidism   . IBS (irritable bowel syndrome)   . Obesity   . Sleep apnea    HAS C -PAP / DOES NOT USE  . Stress incontinence    Pt had surgery to correct this.  . Tachycardia   . Tobacco abuse   . UTI (lower urinary tract infection)     Past Surgical History:  Procedure Laterality Date  . INCONTINENCE SURGERY    . NASAL FRACTURE SURGERY    . ovary removed    . OVARY SURGERY    . PUBOVAGINAL SLING  08/16/2011   Procedure: [name];  Surgeon: Bernestine Amass, MD;  Location: [facility];  Service: Urology;  Laterality: N/A;         . UTERINE FIBROID SURGERY  2001    There were no vitals filed for this visit.  Subjective Assessment - 06/08/17 1236    Subjective  Patient reported ongoing discomfort in back    Pertinent History  HTN, DM, taccycardia/bradycardia, Kidney disease    How long can you stand comfortably?  5 min    How long can  you walk comfortably?  30 min    Diagnostic tests  xray some degeneration in lower vertebra    Patient Stated Goals  be able to stand longer, decrease pain    Currently in Pain?  Yes    Pain Score  6     Pain Location  Back    Pain Orientation  Lower    Pain Descriptors / Indicators  Discomfort    Pain Type  Chronic pain    Pain Onset  More than a month ago    Pain Frequency  Constant    Aggravating Factors   bending or prolong activity    Pain Relieving Factors  at rest                      [facility] Adult PT Treatment/Exercise - 06/08/17 0001      Lumbar Exercises: Aerobic   Stationary Bike  NuStep L4 x10 min Posture/Core focus      Lumbar Exercises: Standing   Other Standing Lumbar Exercises  pink XTS for retraction and lat pull x20 each    Other  Standing Lumbar Exercises  2# D1 D2 on wall for core activation      Lumbar Exercises: Supine   Clam  20 reps;3 seconds with red t-band    Bent Knee Raise  20 reps;3 seconds;10 reps    Bridge  20 reps;3 seconds    Straight Leg Raise  20 reps;3 seconds      Moist Heat Therapy   Number Minutes Moist Heat  15 Minutes    Moist Heat Location  Lumbar Spine      Electrical Stimulation   Electrical Stimulation Location  B lumbar premod 80-150 Hz x 15 min    Electrical Stimulation Goals  Pain                  PT Long Term Goals - 05/23/17 1455      PT LONG TERM GOAL #1   Title  patient to be independent with HEP    Time  8    Period  Weeks    Status  On-going      PT LONG TERM GOAL #2   Title  Patient able to stand to do chores such as dishes with 2/10 pain or less.    Time  8    Period  Weeks    Status  On-going            Plan - 06/08/17 1249    Clinical Impression Statement  Patient tolerated treatment well today. Patient able to progress standing core activation exercises slowly today with some fatigue reported. Patient was educated on ongoing self core activation techniques daily to improve  core strength and improve functional independence. Goals ongoing due to pain limitations.     Rehab Potential  Good    PT Frequency  2x / week    PT Duration  8 weeks    PT Treatment/Interventions  ADLs/Self Care Home Management;Electrical Stimulation;Moist Heat;Traction;Ultrasound;Therapeutic activities;Therapeutic exercise;Neuromuscular re-education;Manual techniques;Dry needling;Patient/family education;Taping    PT Next Visit Plan  cont with POC for core progression/Nustep; DN as needed; nerve glides RLE;  traction at 40% body weight possibly.    Consulted and Agree with Plan of Care  Patient       Patient will benefit from skilled therapeutic intervention in order to improve the following deficits and impairments:  Pain, Decreased strength, Postural dysfunction, Impaired flexibility  Visit Diagnosis: Chronic right-sided low back pain with right-sided sciatica  Radiculopathy, lumbar region     Problem List Patient Active Problem List   Diagnosis Date Noted  . Chronic migraine 01/17/2017  . Diabetic peripheral neuropathy ([place]) 01/17/2017  . OSA (obstructive sleep apnea) 07/27/2016  . Wheezing 07/27/2016  . Hyperglycemia 12/25/2012  . Acute on chronic renal failure ([place]) 12/25/2012  . Pulmonary infiltrates 10/02/2012  . Postnasal drip 10/02/2012  . Hyperkalemia 09/25/2012  . Chronic kidney disease, stage III (moderate) ([place]) 09/25/2012  . Morbid obesity ([place]) 09/24/2012  . HTN (hypertension) 09/24/2012  . Bipolar II disorder ([place]) 09/24/2012  . URI (upper respiratory infection) 09/24/2012  . DKA, type 1 ([place]) 11/04/2011  . Gastroenteritis 11/03/2011  . DM (diabetes mellitus), type 1, uncontrolled ([place]) 11/03/2011  . Hyponatremia 11/03/2011  . CKD (chronic kidney disease), stage II 11/03/2011  . Elevated lipase 11/03/2011  . Hypothyroidism 11/03/2011  . Tobacco abuse 11/03/2011  . SUI (stress urinary incontinence, female) 08/16/2011    Phillips Climes,  PTA 06/08/2017, 1:14 PM  [facility] [address] [place], [place], [address] Phone: [phone]  Fax:  [phone]  Name: Jasmine [name] MRN: [id number] Date of Birth: [birth year]

## 2017-06-13 ENCOUNTER — Ambulatory Visit: Payer: BLUE CROSS/BLUE SHIELD | Admitting: Physical Therapy

## 2017-06-13 DIAGNOSIS — M5441 Lumbago with sciatica, right side: Secondary | ICD-10-CM | POA: Diagnosis not present

## 2017-06-13 DIAGNOSIS — G8929 Other chronic pain: Secondary | ICD-10-CM
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
## 2017-06-13 NOTE — Therapy (Addendum)
[facility] [place], [place], [address] Phone: [phone]   Fax:  [phone]  Physical Therapy Treatment  Patient Details  Name: Jasmine [name] MRN: [id number] Date of Birth: [birth year] Referring Provider: Levy Pupa PA-C   Encounter Date: 06/13/2017  PT End of Session - 06/13/17 1354    Visit Number  5    Number of Visits  16    Date for PT Re-Evaluation  07/15/17    Authorization Type  [employer]    PT Start Time  1350    PT Stop Time  1446    PT Time Calculation (min)  56 min    Activity Tolerance  Patient tolerated treatment well    Behavior During Therapy  [facility] for tasks assessed/performed       Past Medical History:  Diagnosis Date  . Anxiety   . Bipolar disorder ([place])   . Chronic fatigue   . CKD (chronic kidney disease), stage II   . Depression   . Diabetes mellitus   . DKA, type 1 ([place]) 11/04/2011  . Elevated cholesterol   . Fibromyalgia   . GERD (gastroesophageal reflux disease)   . Headache   . Hyperlipemia   . Hypertension   . Hypothyroidism   . IBS (irritable bowel syndrome)   . Obesity   . Sleep apnea    HAS C -PAP / DOES NOT USE  . Stress incontinence    Pt had surgery to correct this.  . Tachycardia   . Tobacco abuse   . UTI (lower urinary tract infection)     Past Surgical History:  Procedure Laterality Date  . INCONTINENCE SURGERY    . NASAL FRACTURE SURGERY    . ovary removed    . OVARY SURGERY    . PUBOVAGINAL SLING  08/16/2011   Procedure: [name];  Surgeon: Bernestine Amass, MD;  Location: [facility];  Service: Urology;  Laterality: N/A;         . UTERINE FIBROID SURGERY  2001    There were no vitals filed for this visit.  Subjective Assessment - 06/13/17 1355    Subjective  Patient states she was doing well, but started hurting during last treatment and it has not calmed back down. Pain stays in the low back. Patient reports that doing laundry with her front  loading machines bothers her.    Pertinent History  HTN, DM, taccycardia/bradycardia, Kidney disease    How long can you stand comfortably?  5 min - patient states this had improved.    Patient Stated Goals  be able to stand longer, decrease pain    Currently in Pain?  Yes    Pain Score  6     Pain Location  Back    Pain Orientation  Lower    Pain Descriptors / Indicators  Discomfort                      [facility] Adult PT Treatment/Exercise - 06/13/17 0001      Self-Care   Self-Care  Other Self-Care Comments    Other Self-Care Comments   DN education and aftercare; use of tennis ball for MFR; ADL modification for laundry      Lumbar Exercises: Aerobic   Stationary Bike  Nustep L5 x 10 min      Lumbar Exercises: Supine   Other Supine Lumbar Exercises  Supine QL stretch "C" and also SDLY with leg off EOB  Modalities   Modalities  Electrical Stimulation;Moist Heat      Moist Heat Therapy   Number Minutes Moist Heat  15 Minutes    Moist Heat Location  Lumbar Spine      Electrical Stimulation   Electrical Stimulation Location  B lumbar premod 80-150 Hz x 15 min    Electrical Stimulation Goals  Pain      Manual Therapy   Manual Therapy  Soft tissue mobilization    Soft tissue mobilization  to R lumbar, QL and gluteals       Trigger Point Dry Needling - 06/13/17 1433    Consent Given?  Yes    Education Handout Provided  Yes    Muscles Treated Upper Body  Quadratus Lumborum R    Muscles Treated Lower Body  Gluteus minimus;Gluteus maximus R and gluteus med    Gluteus Maximus Response  Twitch response elicited;Palpable increased muscle length    Gluteus Minimus Response  Twitch response elicited;Palpable increased muscle length           PT Education - 06/13/17 1435    Education provided  Yes    Education Details  DN education and aftercare    Person(s) Educated  Patient    Methods  Demonstration;Explanation;Handout    Comprehension  Verbalized  understanding          PT Long Term Goals - 05/23/17 1455      PT LONG TERM GOAL #1   Title  patient to be independent with HEP    Time  8    Period  Weeks    Status  On-going      PT LONG TERM GOAL #2   Title  Patient able to stand to do chores such as dishes with 2/10 pain or less.    Time  8    Period  Weeks    Status  On-going            Plan - 06/13/17 1437    Clinical Impression Statement  Patient was doing well toward goals, but experienced a flare up after last treament. No significant change in TE that day, however we held most TE today to resolve increased tone in R QL and gluteals. Patient educated re: DN and gave consent. She had a great response in R QL and gluteals.     PT Treatment/Interventions  ADLs/Self Care Home Management;Electrical Stimulation;Moist Heat;Traction;Ultrasound;Therapeutic activities;Therapeutic exercise;Neuromuscular re-education;Manual techniques;Dry needling;Patient/family education;Taping    PT Next Visit Plan  Assess DN, return to core progression; possibly modify D1/D2 to supine; Nustep; DN as needed; nerve glides RLE;  traction at 40% body weight possibly.    PT Home Exercise Plan  QL stretches       Patient will benefit from skilled therapeutic intervention in order to improve the following deficits and impairments:  Pain, Decreased strength, Postural dysfunction, Impaired flexibility  Visit Diagnosis: Chronic right-sided low back pain with right-sided sciatica     Problem List Patient Active Problem List   Diagnosis Date Noted  . Chronic migraine 01/17/2017  . Diabetic peripheral neuropathy ([place]) 01/17/2017  . OSA (obstructive sleep apnea) 07/27/2016  . Wheezing 07/27/2016  . Hyperglycemia 12/25/2012  . Acute on chronic renal failure ([place]) 12/25/2012  . Pulmonary infiltrates 10/02/2012  . Postnasal drip 10/02/2012  . Hyperkalemia 09/25/2012  . Chronic kidney disease, stage III (moderate) ([place]) 09/25/2012  . Morbid  obesity ([place]) 09/24/2012  . HTN (hypertension) 09/24/2012  . Bipolar II disorder ([place]) 09/24/2012  . URI (  upper respiratory infection) 09/24/2012  . DKA, type 1 ([place]) 11/04/2011  . Gastroenteritis 11/03/2011  . DM (diabetes mellitus), type 1, uncontrolled ([place]) 11/03/2011  . Hyponatremia 11/03/2011  . CKD (chronic kidney disease), stage II 11/03/2011  . Elevated lipase 11/03/2011  . Hypothyroidism 11/03/2011  . Tobacco abuse 11/03/2011  . SUI (stress urinary incontinence, female) 08/16/2011    Madelyn Flavors PT 06/13/2017, 2:41 PM  [facility] [address] [place], [place], [address] Phone: [phone]   Fax:  [phone]  Name: Jasmine [name] MRN: [id number] Date of Birth: 16-Dec-1970  PHYSICAL THERAPY DISCHARGE SUMMARY  Visits from Start of Care: 5  Current functional level related to goals / functional outcomes: SEE ABOVE   Remaining deficits: SEE ABOVE   Education / Equipment: HEP Plan: Patient agrees to discharge.  Patient goals were not met. Patient is being discharged due to not returning since the last visit.  ?????    Madelyn Flavors, PT 03/02/18 10:20 AM [facility] [address] [place], [place], [address] Phone: [phone]   Fax:  [phone]

## 2017-06-13 NOTE — Patient Instructions (Signed)
[facility]  DRY NEEDLING CONSENT FORM   Trigger point dry needling is a physical therapy approach to treat Myofascial Pain and Dysfunction.  Dry Needling (DN) is a valuable and effective way to deactivate myofascial trigger points (muscle knots). It is skilled intervention that uses a thin filiform needle to penetrate the skin and stimulate underlying myofascial trigger points, muscular, and connective tissues for the management of neuromusculoskeletal pain and movement impairments.  A local twitch response (LTR) will be elicited.  This can sometimes feel like a deep ache in the muscle during the procedure. Multiple trigger points in multiple muscles can be treated during each treatment.  No medication of any kind is injected.   As with any medical treatment and procedure, there are possible adverse events.  While significant adverse events are uncommon, they do sometimes occur and must be considered prior to giving consent.  1. Dry needling often causes a "post needling soreness".  There can be an increase in pain from a couple of hours to 2-3 days, followed by an improvement in the overall pain state. 2. Any time a needle is used there is a risk of infection.  However, we are using new, sterile, and disposable needles; infections are extremely rare. 3. There is a possibility that you may bleed or bruise.  You may feel tired and some nausea following treatment. 4. There is a rare possibility of a pneumothorax (air in the chest cavity). 5. Allergic reaction to nickel in the stainless steel needle. 6. If a nerve is touched, it may cause paresthesia (a prickling/shock sensation) which is usually brief, but may continue for a couple of days.  Following treatment stay hydrated. Use heat if you are sore. Continue regular activities but not too vigorous initially after treatment for 24-48 hours.  Dry Needling is best when combined with other physical therapy interventions  such as strengthening, stretching and other therapeutic modalities.     Madelyn Flavors, PT 06/13/17 2:35 PM; [facility] [place], [place], [address] Phone: [phone]   Fax:  [phone]

## 2017-06-14 ENCOUNTER — Emergency Department (HOSPITAL_COMMUNITY)
Admission: EM | Admit: 2017-06-14 | Discharge: 2017-06-14 | Disposition: A | Discharge status: Home / Self Care | Payer: BLUE CROSS/BLUE SHIELD | Attending: Emergency Medicine | Admitting: Emergency Medicine

## 2017-06-14 ENCOUNTER — Ambulatory Visit (HOSPITAL_COMMUNITY): Payer: Self-pay | Admitting: Psychiatry

## 2017-06-14 ENCOUNTER — Encounter (HOSPITAL_COMMUNITY): Payer: Self-pay | Admitting: *Deleted

## 2017-06-14 ENCOUNTER — Encounter (HOSPITAL_COMMUNITY): Payer: Self-pay

## 2017-06-14 DIAGNOSIS — E1065 Type 1 diabetes mellitus with hyperglycemia: Secondary | ICD-10-CM | POA: Insufficient documentation

## 2017-06-14 DIAGNOSIS — Z7982 Long term (current) use of aspirin: Secondary | ICD-10-CM | POA: Diagnosis not present

## 2017-06-14 DIAGNOSIS — E039 Hypothyroidism, unspecified: Secondary | ICD-10-CM | POA: Insufficient documentation

## 2017-06-14 DIAGNOSIS — E1040 Type 1 diabetes mellitus with diabetic neuropathy, unspecified: Secondary | ICD-10-CM | POA: Diagnosis not present

## 2017-06-14 DIAGNOSIS — Z794 Long term (current) use of insulin: Secondary | ICD-10-CM | POA: Insufficient documentation

## 2017-06-14 DIAGNOSIS — N183 Chronic kidney disease, stage 3 (moderate): Secondary | ICD-10-CM | POA: Insufficient documentation

## 2017-06-14 DIAGNOSIS — Z87891 Personal history of nicotine dependence: Secondary | ICD-10-CM | POA: Insufficient documentation

## 2017-06-14 DIAGNOSIS — Z79899 Other long term (current) drug therapy: Secondary | ICD-10-CM | POA: Diagnosis not present

## 2017-06-14 DIAGNOSIS — R739 Hyperglycemia, unspecified: Secondary | ICD-10-CM

## 2017-06-14 DIAGNOSIS — I129 Hypertensive chronic kidney disease with stage 1 through stage 4 chronic kidney disease, or unspecified chronic kidney disease: Secondary | ICD-10-CM | POA: Diagnosis not present

## 2017-06-14 LAB — COMPREHENSIVE METABOLIC PANEL
ALK PHOS: 97 U/L (ref 38–126)
ALT: 14 U/L (ref 14–54)
ANION GAP: 11 (ref 5–15)
AST: 18 U/L (ref 15–41)
Albumin: 3.6 g/dL (ref 3.5–5.0)
BILIRUBIN TOTAL: 0.5 mg/dL (ref 0.3–1.2)
BUN: 29 mg/dL — ABNORMAL HIGH (ref 6–20)
CALCIUM: 10.8 mg/dL — AB (ref 8.9–10.3)
CO2: 25 mmol/L (ref 22–32)
Chloride: 89 mmol/L — ABNORMAL LOW (ref 101–111)
Creatinine, Ser: 2.42 mg/dL — ABNORMAL HIGH (ref 0.44–1.00)
GFR calc Af Amer: 27 mL/min — ABNORMAL LOW (ref >60)
GFR, EST NON AFRICAN AMERICAN: 23 mL/min — AB (ref >60)
Glucose, Bld: 511 mg/dL (ref 65–99)
POTASSIUM: 4.7 mmol/L (ref 3.5–5.1)
Sodium: 125 mmol/L — ABNORMAL LOW (ref 135–145)
TOTAL PROTEIN: 7.4 g/dL (ref 6.5–8.1)

## 2017-06-14 LAB — CBC WITH DIFFERENTIAL/PLATELET
Basophils Absolute: 0.1 10*3/uL (ref 0.0–0.1)
Basophils Relative: 1 %
Eosinophils Absolute: 0.3 10*3/uL (ref 0.0–0.7)
Eosinophils Relative: 4 %
HEMATOCRIT: 41.3 % (ref 36.0–46.0)
HEMOGLOBIN: 13.4 g/dL (ref 12.0–15.0)
LYMPHS ABS: 1.3 10*3/uL (ref 0.7–4.0)
LYMPHS PCT: 19 %
MCH: 28.7 pg (ref 26.0–34.0)
MCHC: 32.4 g/dL (ref 30.0–36.0)
MCV: 88.4 fL (ref 78.0–100.0)
MONO ABS: 0.8 10*3/uL (ref 0.1–1.0)
MONOS PCT: 11 %
NEUTROS ABS: 4.6 10*3/uL (ref 1.7–7.7)
Neutrophils Relative %: 65 %
Platelets: 243 10*3/uL (ref 150–400)
RBC: 4.67 MIL/uL (ref 3.87–5.11)
RDW: 13.4 % (ref 11.5–15.5)
WBC: 7.1 10*3/uL (ref 4.0–10.5)

## 2017-06-14 LAB — CBG MONITORING, ED
GLUCOSE-CAPILLARY: 574 mg/dL — AB (ref 65–99)
GLUCOSE-CAPILLARY: 368 mg/dL — AB (ref 65–99)

## 2017-06-14 MED ORDER — SODIUM CHLORIDE 0.9 % IV SOLN
Freq: Once | INTRAVENOUS | Status: AC
  Administered 2017-06-14: 18:00:00 via INTRAVENOUS

## 2017-06-14 NOTE — ED Notes (Signed)
CRITICAL VALUE ALERT  Critical Value:  CBG 511  Date & Time Notied:  06-14-17 1503  Provider Notified: [place],MD  Orders Received/Actions taken: continue to monitor.

## 2017-06-14 NOTE — ED Provider Notes (Signed)
[facility] EMERGENCY DEPARTMENT Provider Note   CSN: [id number] Arrival date & time: 06/14/17  1222     History   Chief Complaint Chief Complaint  Patient presents with  . Hyperglycemia    HPI Jasmine [name] is a 47 y.o. female.   Hyperglycemia  Blood sugar level PTA:  >600 Severity:  Severe Onset quality:  Gradual Duration:  2 days Timing:  Constant Chronicity:  Recurrent Diabetes status:  Controlled with insulin Current diabetic therapy:  Insulin pump with novolog Context: insulin pump use   Relieved by:  None tried Ineffective treatments:  None tried Associated symptoms: abdominal pain and nausea   Risk factors: obesity     Past Medical History:  Diagnosis Date  . Anxiety   . Bipolar disorder ([place])   . Chronic fatigue   . CKD (chronic kidney disease), stage II   . Depression   . Diabetes mellitus   . DKA, type 1 ([place]) 11/04/2011  . Elevated cholesterol   . Fibromyalgia   . GERD (gastroesophageal reflux disease)   . Headache   . Hyperlipemia   . Hypertension   . Hypothyroidism   . IBS (irritable bowel syndrome)   . Obesity   . Sleep apnea    HAS C -PAP / DOES NOT USE  . Stress incontinence    Pt had surgery to correct this.  . Tachycardia   . Tobacco abuse   . UTI (lower urinary tract infection)     Patient Active Problem List   Diagnosis Date Noted  . Chronic migraine 01/17/2017  . Diabetic peripheral neuropathy ([place]) 01/17/2017  . OSA (obstructive sleep apnea) 07/27/2016  . Wheezing 07/27/2016  . Hyperglycemia 12/25/2012  . Acute on chronic renal failure ([place]) 12/25/2012  . Pulmonary infiltrates 10/02/2012  . Postnasal drip 10/02/2012  . Hyperkalemia 09/25/2012  . Chronic kidney disease, stage III (moderate) ([place]) 09/25/2012  . Morbid obesity ([place]) 09/24/2012  . HTN (hypertension) 09/24/2012  . Bipolar II disorder ([place]) 09/24/2012  . URI (upper respiratory infection) 09/24/2012  . DKA, type 1 ([place]) 11/04/2011  . Gastroenteritis  11/03/2011  . DM (diabetes mellitus), type 1, uncontrolled ([place]) 11/03/2011  . Hyponatremia 11/03/2011  . CKD (chronic kidney disease), stage II 11/03/2011  . Elevated lipase 11/03/2011  . Hypothyroidism 11/03/2011  . Tobacco abuse 11/03/2011  . SUI (stress urinary incontinence, female) 08/16/2011    Past Surgical History:  Procedure Laterality Date  . INCONTINENCE SURGERY    . NASAL FRACTURE SURGERY    . ovary removed    . OVARY SURGERY    . PUBOVAGINAL SLING  08/16/2011   Procedure: [name];  Surgeon: Bernestine Amass, MD;  Location: [facility];  Service: Urology;  Laterality: N/A;         . UTERINE FIBROID SURGERY  2001    OB History    Gravida Para Term Preterm AB Living   1             SAB TAB Ectopic Multiple Live Births                   Home Medications    Prior to Admission medications   Medication Sig Start Date End Date Taking? Authorizing Provider  acetaminophen (TYLENOL) 500 MG tablet Take 1,000 mg by mouth every 6 (six) hours as needed. For pain   Yes [provider]  aspirin EC 81 MG tablet Take 81 mg by mouth every evening.    Yes [provider]  carvedilol (COREG) 25 MG tablet Take 37.5 mg by mouth 2 (two) times daily with a meal.    Yes [provider]  cholecalciferol (VITAMIN D) 1000 UNITS tablet Take 1,000 Units by mouth daily.   Yes [provider]  Coenzyme Q10 (CO Q 10) 10 MG CAPS Take 1 tablet by mouth every evening.   Yes [provider]  diazepam (VALIUM) 5 MG tablet Take 5 mg by mouth 2 (two) times daily as needed for anxiety or muscle spasms.    Yes [provider]  DULoxetine (CYMBALTA) 60 MG capsule Take 1 capsule (60 mg total) by mouth 2 (two) times daily. 05/12/17  Yes Hisada, Elie Goody, MD  fluticasone (FLONASE) 50 MCG/ACT nasal spray Place 2 sprays into the nose daily as needed for allergies.    Yes [provider]  folic acid (FOLVITE) 962 MCG tablet Take 800 mcg by mouth  every evening.    Yes [provider]  furosemide (LASIX) 40 MG tablet Take 40 mg by mouth daily.    Yes [provider]  gabapentin (NEURONTIN) 100 MG capsule Take 100 mg by mouth at bedtime.   Yes [provider]  lamoTRIgine (LAMICTAL) 200 MG tablet Take 1 tablet (200 mg total) by mouth daily. 05/12/17  Yes Hisada, Elie Goody, MD  levothyroxine (SYNTHROID, LEVOTHROID) 150 MCG tablet Take 150 mcg by mouth daily before breakfast.   Yes [provider]  lubiprostone (AMITIZA) 24 MCG capsule Take 24 mcg by mouth 2 (two) times daily with a meal.    Yes [provider]  lurasidone (LATUDA) 40 MG TABS tablet Take 1 tablet (40 mg total) by mouth daily with breakfast. 05/12/17  Yes Hisada, Elie Goody, MD  magnesium 30 MG tablet Take 30 mg by mouth at bedtime.   Yes [provider]  Melatonin 3 MG TABS Take 3 mg by mouth at bedtime.    Yes [provider]  NOVOLOG 100 UNIT/ML injection USE AS DIRECTED FOR INSULIN PUMP MAX DAILY DOSE 120 UNITS PER DAY 02/24/17  Yes [provider]  omeprazole (PRILOSEC) 20 MG capsule Take 20 mg by mouth daily as needed.    Yes [provider]  ondansetron (ZOFRAN ODT) 4 MG disintegrating tablet Take 1 tablet (4 mg total) by mouth every 8 (eight) hours as needed. 01/17/17  Yes Marcial Pacas, MD  pravastatin (PRAVACHOL) 80 MG tablet Take 80 mg by mouth at bedtime.   Yes [provider]  PROAIR HFA 108 (90 Base) MCG/ACT inhaler INHALE TWO PUFFS INTO THE LUNGS EVERY 6 HOURS AS NEEDED FOR WHEEZING OR SHORTNESS OF BREATH 12/30/16  Yes [provider]  rizatriptan (MAXALT-MLT) 10 MG disintegrating tablet Take 1 tablet (10 mg total) as needed by mouth. May repeat in 2 hours if needed 04/20/17  Yes Marcial Pacas, MD  SALINE NASAL MIST NA Place 1-2 sprays into the nose daily as needed (FOR CONGESTION).    Yes [provider]  traMADol (ULTRAM) 50 MG tablet Take 50 mg by mouth 2 (two) times daily as  needed.    Yes [provider]  zolpidem (AMBIEN) 5 MG tablet Take 0.5 tablets (2.5 mg total) by mouth at bedtime as needed for sleep. 05/12/17  Yes Norman Clay, MD  oxyCODONE-acetaminophen (PERCOCET/ROXICET) 5-325 MG tablet Take 2 tablets every 4 (four) hours as needed by mouth for severe pain. Patient not taking: Reported on 06/14/2017 04/15/17   Sidney Ace    Family History Family History  Problem  Relation Age of Onset  . Asthma Mother   . Bipolar disorder Mother   . Heart disease Father   . Lymphoma Father   . Hypertension Father   . Thyroid disease Father   . Hyperlipidemia Father   . Diabetes Father   . Cancer Paternal Grandmother        lung and breast  . Bladder Cancer Paternal Grandfather   . Suicidality Maternal Grandfather   . Thyroid disease Brother     Social History Social History   Tobacco Use  . Smoking status: Former Smoker    Packs/day: 0.75    Years: 20.00    Pack years: 15.00    Types: Cigarettes    Last attempt to quit: 06/07/2012    Years since quitting: 5.0  . Smokeless tobacco: Never Used  Substance Use Topics  . Alcohol use: No  . Drug use: No     Allergies   Ciprofloxacin; Buspar [buspirone]; Advair diskus [fluticasone-salmeterol]; Levaquin [levofloxacin]; Biaxin [clarithromycin]; and Hydroxyzine   Review of Systems Review of Systems  Gastrointestinal: Positive for abdominal pain, diarrhea and nausea.  All other systems reviewed and are negative.    Physical Exam Updated Vital Signs BP (!) 125/55 (BP Location: Left Arm)   Pulse 63   Temp 98.2 F (36.8 C) (Oral)   Resp 14   Ht 5\' 9"  (1.753 m)   Wt (!) 140.6 kg (310 lb)   SpO2 90%   BMI 45.78 kg/m   Physical Exam  Constitutional: She is oriented to person, place, and time. She appears well-developed and well-nourished.  HENT:  Head: Normocephalic and atraumatic.  Eyes: Conjunctivae and EOM are normal.  Neck: Normal range of motion.  Cardiovascular: Normal  rate and regular rhythm.  Pulmonary/Chest: No stridor. No respiratory distress.  Abdominal: Soft. Bowel sounds are normal. She exhibits no distension.  Musculoskeletal: Normal range of motion. She exhibits no edema or deformity.  Neurological: She is alert and oriented to person, place, and time. No cranial nerve deficit. Coordination normal.  Skin: Skin is warm and dry.  Nursing note and vitals reviewed.    ED Treatments / Results  Labs (all labs ordered are listed, but only abnormal results are displayed) Labs Reviewed  COMPREHENSIVE METABOLIC PANEL - Abnormal; Notable for the following components:      Result Value   Sodium 125 (*)    Chloride 89 (*)    Glucose, Bld 511 (*)    BUN 29 (*)    Creatinine, Ser 2.42 (*)    Calcium 10.8 (*)    GFR calc non Af Amer 23 (*)    GFR calc Af Amer 27 (*)    All other components within normal limits  CBG MONITORING, ED - Abnormal; Notable for the following components:   Glucose-Capillary 574 (*)    All other components within normal limits  CBG MONITORING, ED - Abnormal; Notable for the following components:   Glucose-Capillary 368 (*)    All other components within normal limits  CBC WITH DIFFERENTIAL/PLATELET    EKG  EKG Interpretation None       Radiology No results found.  Procedures Procedures (including critical care time)  Medications Ordered in ED Medications  0.9 %  sodium chloride infusion ( Intravenous Stopped 06/14/17 1928)     Initial Impression / Assessment and Plan / ED Course  I have reviewed the triage vital signs and the nursing notes.  Pertinent labs & imaging results that were available during my care  of the patient were reviewed by me and considered in my medical decision making (see chart for details).   Improving blood sugar. Likely 2/2 viral gi illness. Will give fluids. Pump is correcting blood sugars. Likely discharge.   Final Clinical Impressions(s) / ED Diagnoses   Final diagnoses:    Hyperglycemia     Uri Turnbough, Corene Cornea, MD 06/14/17 2044

## 2017-06-14 NOTE — ED Triage Notes (Signed)
Pt c/o diarrhea for 2 days, LUQ pain as well. Denies emesis

## 2017-06-14 NOTE — ED Triage Notes (Signed)
Pt with high blood sugars at home since last night.  Pt on insulin pump.

## 2017-06-14 NOTE — ED Notes (Signed)
Informed pt of delay. Pt in NAD noted. Ambulatory to small waiting room for vitals recheck.

## 2017-06-15 ENCOUNTER — Encounter (HOSPITAL_COMMUNITY): Payer: Self-pay | Admitting: Psychiatry

## 2017-06-15 ENCOUNTER — Ambulatory Visit (INDEPENDENT_AMBULATORY_CARE_PROVIDER_SITE_OTHER): Payer: BLUE CROSS/BLUE SHIELD | Admitting: Psychiatry

## 2017-06-15 VITALS — BP 128/85 | HR 75 | Ht 69.0 in | Wt 305.0 lb

## 2017-06-15 DIAGNOSIS — Z818 Family history of other mental and behavioral disorders: Secondary | ICD-10-CM

## 2017-06-15 DIAGNOSIS — F3181 Bipolar II disorder: Secondary | ICD-10-CM

## 2017-06-15 DIAGNOSIS — Z975 Presence of (intrauterine) contraceptive device: Secondary | ICD-10-CM | POA: Diagnosis not present

## 2017-06-15 DIAGNOSIS — Z87891 Personal history of nicotine dependence: Secondary | ICD-10-CM

## 2017-06-15 MED ORDER — LAMOTRIGINE 200 MG PO TABS: 200.0000 mg | ORAL_TABLET | Freq: Every day | ORAL | 0 refills | Status: DC

## 2017-06-15 MED ORDER — LURASIDONE HCL 20 MG PO TABS: 20.0000 mg | ORAL_TABLET | Freq: Every day | ORAL | 0 refills | Status: DC

## 2017-06-15 MED ORDER — ZOLPIDEM TARTRATE 5 MG PO TABS: 2.5000 mg | ORAL_TABLET | Freq: Every evening | ORAL | 0 refills | Status: DC | PRN

## 2017-06-15 MED ORDER — DULOXETINE HCL 60 MG PO CPEP: 60.0000 mg | ORAL_CAPSULE | Freq: Two times a day (BID) | ORAL | 0 refills | Status: DC

## 2017-06-15 NOTE — Patient Instructions (Signed)
1. Continue lamotrigine 200 mg daily  2, Continue duloxetine 60 mg twice a day  3. Decrease  latuda 20 mg daily  4. Continue Ambien 2.5 mg at night as needed for sleep 5. Continue melatonin 3 mg at night as needed 6. Return to clinic in one month for 15 mins

## 2017-06-16 ENCOUNTER — Encounter: Payer: Self-pay | Admitting: Physical Therapy

## 2017-06-27 ENCOUNTER — Ambulatory Visit: Payer: BLUE CROSS/BLUE SHIELD | Admitting: Physical Therapy
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
## 2017-07-18 ENCOUNTER — Ambulatory Visit (INDEPENDENT_AMBULATORY_CARE_PROVIDER_SITE_OTHER): Payer: BLUE CROSS/BLUE SHIELD | Admitting: Psychiatry

## 2017-07-18 ENCOUNTER — Encounter (HOSPITAL_COMMUNITY): Payer: Self-pay | Admitting: Psychiatry

## 2017-07-18 ENCOUNTER — Encounter: Payer: Self-pay | Admitting: Physical Therapy

## 2017-07-18 VITALS — BP 152/88 | HR 66 | Ht 69.0 in | Wt 301.0 lb

## 2017-07-18 DIAGNOSIS — G4700 Insomnia, unspecified: Secondary | ICD-10-CM

## 2017-07-18 DIAGNOSIS — R450 Nervousness: Secondary | ICD-10-CM

## 2017-07-18 DIAGNOSIS — F3181 Bipolar II disorder: Secondary | ICD-10-CM | POA: Diagnosis not present

## 2017-07-18 DIAGNOSIS — Z818 Family history of other mental and behavioral disorders: Secondary | ICD-10-CM | POA: Diagnosis not present

## 2017-07-18 DIAGNOSIS — F419 Anxiety disorder, unspecified: Secondary | ICD-10-CM

## 2017-07-18 DIAGNOSIS — Z736 Limitation of activities due to disability: Secondary | ICD-10-CM | POA: Diagnosis not present

## 2017-07-18 DIAGNOSIS — Z87891 Personal history of nicotine dependence: Secondary | ICD-10-CM

## 2017-07-18 DIAGNOSIS — Z975 Presence of (intrauterine) contraceptive device: Secondary | ICD-10-CM

## 2017-07-18 MED ORDER — DULOXETINE HCL 60 MG PO CPEP: 60.0000 mg | ORAL_CAPSULE | Freq: Two times a day (BID) | ORAL | 0 refills | Status: DC

## 2017-07-18 MED ORDER — ZOLPIDEM TARTRATE 5 MG PO TABS: 2.5000 mg | ORAL_TABLET | Freq: Every evening | ORAL | 0 refills | Status: DC | PRN

## 2017-07-18 MED ORDER — BUPROPION HCL ER (XL) 150 MG PO TB24: 150.0000 mg | ORAL_TABLET | Freq: Every day | ORAL | 0 refills | Status: DC

## 2017-07-18 MED ORDER — LAMOTRIGINE 200 MG PO TABS: 200.0000 mg | ORAL_TABLET | Freq: Every day | ORAL | 0 refills | Status: DC

## 2017-07-18 NOTE — Patient Instructions (Addendum)
1. Continue lamotrigine 200 mg daily  2, Continue duloxetine 60 mg twice a day  3. Continue latuda 20 mg daily  4. Start Wellbutrin 150 mg daily  4. Continue Ambien 2.5 mg at night as needed for sleep 5. Continue melatonin 3 mg at night as needed 6. Return to clinic in one month for 15 mins

## 2017-07-19 ENCOUNTER — Encounter: Payer: Self-pay | Admitting: Physical Therapy

## 2017-08-03 ENCOUNTER — Other Ambulatory Visit (HOSPITAL_COMMUNITY): Payer: Self-pay | Admitting: *Deleted

## 2017-08-03 ENCOUNTER — Ambulatory Visit (HOSPITAL_COMMUNITY): Payer: BLUE CROSS/BLUE SHIELD | Attending: Internal Medicine

## 2017-08-03 DIAGNOSIS — R0689 Other abnormalities of breathing: Principal | ICD-10-CM

## 2017-08-03 DIAGNOSIS — R0609 Other forms of dyspnea: Secondary | ICD-10-CM

## 2017-08-03 DIAGNOSIS — R0600 Dyspnea, unspecified: Secondary | ICD-10-CM

## 2017-08-11 ENCOUNTER — Telehealth: Payer: Self-pay | Admitting: Internal Medicine

## 2017-08-11 DIAGNOSIS — R9439 Abnormal result of other cardiovascular function study: Secondary | ICD-10-CM

## 2017-08-11 NOTE — Telephone Encounter (Signed)
Kathleen Lime D/w Landis Martins CPST tech: reports desats happened during exercise and improved with rest. D/w Dr Vaughan Browner - subsequently as well  Plan - let poatient know  0- keep 09/06/17 office visit  - refer Dr Haroldine Laws or Dr Aundra Dubin for right heart cath; will need cath with exertional input too. Let me know when you get the appointmet with them and I will inform them  Thanks  Dr. Brand Males, M.D., [facility].C.P Pulmonary and Critical Care Medicine Staff Physician, [place] Director - Interstitial Lung Disease  Program  P[facility] [place] [facility] [place], [place], [address]  Pager: [phone], If no answer or between  15:00h - 7:00h: call [phone] Telephone: [phone]
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
## 2017-08-15 ENCOUNTER — Ambulatory Visit (INDEPENDENT_AMBULATORY_CARE_PROVIDER_SITE_OTHER): Payer: BLUE CROSS/BLUE SHIELD | Admitting: Psychiatry

## 2017-08-15 ENCOUNTER — Encounter (HOSPITAL_COMMUNITY): Payer: Self-pay | Admitting: Psychiatry

## 2017-08-15 VITALS — BP 114/76 | HR 74 | Ht 69.0 in | Wt 297.0 lb

## 2017-08-15 DIAGNOSIS — R5383 Other fatigue: Secondary | ICD-10-CM

## 2017-08-15 DIAGNOSIS — Z818 Family history of other mental and behavioral disorders: Secondary | ICD-10-CM

## 2017-08-15 DIAGNOSIS — G4700 Insomnia, unspecified: Secondary | ICD-10-CM | POA: Diagnosis not present

## 2017-08-15 DIAGNOSIS — Z975 Presence of (intrauterine) contraceptive device: Secondary | ICD-10-CM

## 2017-08-15 DIAGNOSIS — F419 Anxiety disorder, unspecified: Secondary | ICD-10-CM

## 2017-08-15 DIAGNOSIS — R4584 Anhedonia: Secondary | ICD-10-CM

## 2017-08-15 DIAGNOSIS — R450 Nervousness: Secondary | ICD-10-CM | POA: Diagnosis not present

## 2017-08-15 DIAGNOSIS — F3181 Bipolar II disorder: Secondary | ICD-10-CM | POA: Diagnosis not present

## 2017-08-15 DIAGNOSIS — Z736 Limitation of activities due to disability: Secondary | ICD-10-CM | POA: Diagnosis not present

## 2017-08-15 DIAGNOSIS — Z87891 Personal history of nicotine dependence: Secondary | ICD-10-CM

## 2017-08-15 MED ORDER — BUPROPION HCL ER (XL) 150 MG PO TB24: 150.0000 mg | ORAL_TABLET | Freq: Every day | ORAL | 1 refills | Status: DC

## 2017-08-15 MED ORDER — LURASIDONE HCL 40 MG PO TABS: 40.0000 mg | ORAL_TABLET | Freq: Every day | ORAL | 1 refills | Status: DC

## 2017-08-15 MED ORDER — DULOXETINE HCL 60 MG PO CPEP: 60.0000 mg | ORAL_CAPSULE | Freq: Two times a day (BID) | ORAL | 1 refills | Status: DC

## 2017-08-15 MED ORDER — LAMOTRIGINE 200 MG PO TABS: 200.0000 mg | ORAL_TABLET | Freq: Every day | ORAL | 1 refills | Status: DC

## 2017-08-15 NOTE — Patient Instructions (Addendum)
1. Continue lamotrigine 200 mg daily  2, Continue duloxetine 60 mg twice a day  3.Increaselatuda 40 mg daily  4. Continue Wellbutrin 150 mg daily  5. Continue melatonin 3 mg at night as needed 6. Return to clinic in six weeks for 15 mins

## 2017-08-17 NOTE — Telephone Encounter (Signed)
Called and spoke with pt making her aware we have placed the order for her to be referred to Cardiology and they should hopefully be contacting her soon.  Pt expressed understanding. Nothing further needed a this current time.

## 2017-08-17 NOTE — Telephone Encounter (Signed)
The Order has been sent to Cardiology a staff message has been sent to [facility] they will contact the patient.

## 2017-08-17 NOTE — Telephone Encounter (Signed)
Pt is calling back [phone]

## 2017-08-17 NOTE — Telephone Encounter (Signed)
Order placed for cardiology. ATC pt, no answer. Left message for pt to call back.  PCC's can you let us know when appt is set?

## 2017-08-17 NOTE — Telephone Encounter (Signed)
Closed in error.  Going to route the message back to myself until we hear from cardiology about when pt's appt is going to be so we can let MR know.

## 2017-08-22 ENCOUNTER — Telehealth: Payer: Self-pay | Admitting: Internal Medicine

## 2017-08-22 NOTE — Telephone Encounter (Signed)
Called patient unable to reach left message to give us a call back.

## 2017-08-23 NOTE — Telephone Encounter (Signed)
Spoke with the pt  She states that she is needing [place] to check on her cards referral  She called cards and was advised that they need a "staff message"- looks like one was sent to Aud[facility] already  Can you please give an update on her referral? Thanks!

## 2017-08-23 NOTE — Telephone Encounter (Signed)
Called pt told her that 2 weeks to a month for this kind of referral also let her know we have sent a staff message to [employer] and I also gave her the phone number to that office.

## 2017-08-26 NOTE — Telephone Encounter (Signed)
Called and spoke with Janett Billow from cardiology she states that patient has been scheduled for April 23 at 2:30. They have contacted patient.

## 2017-08-31 ENCOUNTER — Telehealth (HOSPITAL_COMMUNITY): Payer: Self-pay | Admitting: *Deleted

## 2017-08-31 ENCOUNTER — Other Ambulatory Visit (HOSPITAL_COMMUNITY): Payer: Self-pay | Admitting: *Deleted

## 2017-08-31 DIAGNOSIS — R0600 Dyspnea, unspecified: Secondary | ICD-10-CM

## 2017-08-31 NOTE — Telephone Encounter (Signed)
Per Dr Golden Pop office pt needs RHC w/exercise w/Dr Bensimhon to eval dyspnea.  Pt is sch for 4/8 at 9 am, pt is aware and all instructions have been reviewed with her.

## 2017-09-06 ENCOUNTER — Ambulatory Visit (INDEPENDENT_AMBULATORY_CARE_PROVIDER_SITE_OTHER): Payer: BLUE CROSS/BLUE SHIELD | Admitting: Internal Medicine

## 2017-09-06 ENCOUNTER — Encounter: Payer: Self-pay | Admitting: Internal Medicine

## 2017-09-06 VITALS — BP 122/72 | HR 69 | Ht 69.0 in | Wt 300.4 lb

## 2017-09-06 DIAGNOSIS — R918 Other nonspecific abnormal finding of lung field: Secondary | ICD-10-CM | POA: Diagnosis not present

## 2017-09-06 DIAGNOSIS — R0689 Other abnormalities of breathing: Secondary | ICD-10-CM

## 2017-09-06 DIAGNOSIS — R0600 Dyspnea, unspecified: Secondary | ICD-10-CM

## 2017-09-06 NOTE — Patient Instructions (Signed)
ICD-10-CM   1. Dyspnea and respiratory abnormalities R06.00    R06.89   2. Multiple lung nodules on CT R91.8     Agree with cardiac right heart cath later this month and cardiology visit Do HRCT supine and prone for above issues sometime in Oct 2019  Followup - return in 2 months to review cardiology findings  - return again in Oct 2019 after CT chest

## 2017-09-06 NOTE — Progress Notes (Addendum)
Subjective:     Patient ID: Jasmine [name], female   DOB: [birth year], 47 y.o.   MRN: [id number]  HPI   OV 09/06/2017  Chief Complaint  Patient presents with  . Follow-up    CPST 2/27.  [place] scheduled 09/12/17.  Pt does have some SOB. Denies any cough or CP.   Follow-up dyspnea and pulmonary nodules  She had cardiopulmonary stress test end of February 2019.  This showed desaturations during exercise.  I spoke to the technician Landis Martins and she categorically stated that desaturation got worse with exercise.  Therefore she has been set up with Dr. Haroldine Laws for a right heart catheterization later this month with a cardiology follow-up towards the end of the month.  At this point in time overall she is stable without any new problems.  Dyspnea on exertion persists.  Walking desaturation test 185 feet x3 laps on room air at normal pace submaximal exertion: Walking desaturation test on 09/06/2017 185 feet x 3 laps on ROOM AIR:  did get dyspenic and fatigued with 3 lap walking and did have mild  Desaturate to > 3% drop but not below 88%. Rest pulse ox was 97%, final pulse ox was 92%. HR response 73/min at rest to 90/min at peak exertion. Patient Jasmine [name]  Did not Desaturate < 88% . Jasmine [name] yes did  Desaturated </= 3% points. Jasmine [name] yes did get tachyardic      has a past medical history of Anxiety, Bipolar disorder ([place]), Chronic fatigue, CKD (chronic kidney disease), stage II, Depression, Diabetes mellitus, DKA, type 1 ([place]) (11/04/2011), Elevated cholesterol, Fibromyalgia, GERD (gastroesophageal reflux disease), Headache, Hyperlipemia, Hypertension, Hypothyroidism, IBS (irritable bowel syndrome), Obesity, Sleep apnea, Stress incontinence, Tachycardia, Tobacco abuse, and UTI (lower urinary tract infection).   reports that she quit smoking about 5 years ago. Her smoking use included cigarettes. She has a 15.00 pack-year smoking history. She has never used smokeless  tobacco.  Past Surgical History:  Procedure Laterality Date  . INCONTINENCE SURGERY    . NASAL FRACTURE SURGERY    . ovary removed    . OVARY SURGERY    . PUBOVAGINAL SLING  08/16/2011   Procedure: [name];  Surgeon: Bernestine Amass, MD;  Location: [facility];  Service: Urology;  Laterality: N/A;         . UTERINE FIBROID SURGERY  2001    Allergies  Allergen Reactions  . Ciprofloxacin Swelling and Other (See Comments)    Per pt caused lips swell and nauseous feeling  . Buspar [Buspirone] Other (See Comments)    abd cramping  . Advair Diskus [Fluticasone-Salmeterol] Other (See Comments)    Thrush   . Levaquin [Levofloxacin] Other (See Comments)    Per pt caused lips swell and nauseous feeling  . Biaxin [Clarithromycin] Rash  . Hydroxyzine Palpitations    Immunization History  Administered Date(s) Administered  . Influenza Split 04/07/2012, 04/07/2013  . Influenza,inj,Quad PF,6+ Mos 03/03/2009, 04/07/2012, 04/07/2013, 03/10/2016  . Influenza-Unspecified 03/10/2017  . Pneumococcal Polysaccharide-23 04/08/2011  . Pneumococcal-Unspecified 04/08/2011, 03/10/2017  . Tdap 04/21/2016    Family History  Problem Relation Age of Onset  . Asthma Mother   . Bipolar disorder Mother   . Heart disease Father   . Lymphoma Father   . Hypertension Father   . Thyroid disease Father   . Hyperlipidemia Father   . Diabetes Father   . Cancer Paternal Grandmother        lung and breast  .  Bladder Cancer Paternal Grandfather   . Suicidality Maternal Grandfather   . Thyroid disease Brother      Current Outpatient Medications:  .  acetaminophen (TYLENOL) 500 MG tablet, Take 1,000 mg by mouth every 6 (six) hours as needed for moderate pain or headache. , Disp: , Rfl:  .  aspirin EC 81 MG tablet, Take 81 mg by mouth at bedtime. , Disp: , Rfl:  .  buPROPion (WELLBUTRIN XL) 150 MG 24 hr tablet, Take 1 tablet (150 mg total) by mouth daily., Disp: 30 tablet, Rfl: 1 .  carvedilol  (COREG) 25 MG tablet, Take 37.5 mg by mouth 2 (two) times daily with a meal. , Disp: , Rfl:  .  cholecalciferol (VITAMIN D) 1000 UNITS tablet, Take 1,000 Units by mouth daily., Disp: , Rfl:  .  Coenzyme Q10 (CO Q 10 PO), Take 50 mg by mouth every evening. , Disp: , Rfl:  .  diazepam (VALIUM) 5 MG tablet, Take 5 mg by mouth 2 (two) times daily as needed for anxiety or muscle spasms. , Disp: , Rfl:  .  Diclofenac Sodium (PENNSAID) 2 % SOLN, Place 1 application onto the skin daily as needed (for back pain)., Disp: , Rfl:  .  DULoxetine (CYMBALTA) 60 MG capsule, Take 1 capsule (60 mg total) by mouth 2 (two) times daily., Disp: 60 capsule, Rfl: 1 .  fluticasone (FLONASE) 50 MCG/ACT nasal spray, Place 2 sprays into the nose daily as needed for allergies. , Disp: , Rfl:  .  folic acid (FOLVITE) 254 MCG tablet, Take 400 mcg by mouth every evening. , Disp: , Rfl:  .  furosemide (LASIX) 40 MG tablet, Take 40 mg by mouth daily. , Disp: , Rfl:  .  gabapentin (NEURONTIN) 300 MG capsule, Take 300 mg by mouth at bedtime. , Disp: , Rfl:  .  lamoTRIgine (LAMICTAL) 200 MG tablet, Take 1 tablet (200 mg total) by mouth daily., Disp: 30 tablet, Rfl: 1 .  levothyroxine (SYNTHROID, LEVOTHROID) 150 MCG tablet, Take 150 mcg by mouth daily before breakfast., Disp: , Rfl:  .  lubiprostone (AMITIZA) 8 MCG capsule, Take 8 mcg by mouth 2 (two) times daily with a meal. , Disp: , Rfl:  .  lurasidone (LATUDA) 40 MG TABS tablet, Take 1 tablet (40 mg total) by mouth daily with breakfast., Disp: 30 tablet, Rfl: 1 .  Melatonin 3 MG TABS, Take 3 mg by mouth at bedtime. , Disp: , Rfl:  .  NOVOLOG 100 UNIT/ML injection, USE AS DIRECTED FOR INSULIN PUMP MAX DAILY DOSE 120 UNITS PER DAY, Disp: , Rfl: 2 .  Omega-3 Fatty Acids (FISH OIL PO), Take 1 capsule by mouth daily., Disp: , Rfl:  .  omeprazole (PRILOSEC) 20 MG capsule, Take 20 mg by mouth at bedtime. , Disp: , Rfl:  .  ondansetron (ZOFRAN ODT) 4 MG disintegrating tablet, Take 1 tablet  (4 mg total) by mouth every 8 (eight) hours as needed. (Patient taking differently: Take 4 mg by mouth every 8 (eight) hours as needed for nausea or vomiting. ), Disp: 20 tablet, Rfl: 6 .  pravastatin (PRAVACHOL) 80 MG tablet, Take 80 mg by mouth at bedtime., Disp: , Rfl:  .  PROAIR HFA 108 (90 Base) MCG/ACT inhaler, INHALE TWO PUFFS INTO THE LUNGS EVERY 6 HOURS AS NEEDED FOR WHEEZING OR SHORTNESS OF BREATH, Disp: , Rfl: 1 .  rizatriptan (MAXALT-MLT) 10 MG disintegrating tablet, Take 1 tablet (10 mg total) as needed by mouth. May repeat in  2 hours if needed (Patient taking differently: Take 10 mg by mouth as needed for migraine. May repeat in 2 hours if needed), Disp: 15 tablet, Rfl: 11 .  Semaglutide (OZEMPIC) 0.25 or 0.5 MG/DOSE SOPN, Inject 0.25 mg into the skin every Sunday., Disp: , Rfl:  .  sodium chloride (OCEAN) 0.65 % SOLN nasal spray, Place 2 sprays into both nostrils as needed for congestion., Disp: , Rfl:  .  traMADol (ULTRAM) 50 MG tablet, Take 50 mg by mouth 2 (two) times daily as needed for moderate pain. , Disp: , Rfl:  .  zolpidem (AMBIEN) 5 MG tablet, Take 0.5 tablets (2.5 mg total) by mouth at bedtime as needed for sleep., Disp: 15 tablet, Rfl: 0 .  oxyCODONE-acetaminophen (PERCOCET/ROXICET) 5-325 MG tablet, Take 2 tablets every 4 (four) hours as needed by mouth for severe pain. (Patient not taking: Reported on 09/05/2017), Disp: 15 tablet, Rfl: 0     Review of Systems     Objective:   Physical Exam Vitals:   09/06/17 0908  BP: 122/72  Pulse: 69  SpO2: 95%  Weight: (!) 300 lb 6.4 oz (136.3 kg)  Height: 5\' 9"  (1.753 m)    Estimated body mass index is 44.36 kg/m as calculated from the following:   Height as of this encounter: 5\' 9"  (1.753 m).   Weight as of this encounter: 300 lb 6.4 oz (136.3 kg). There is a discussion only visit    Assessment:       ICD-10-CM   1. Dyspnea and respiratory abnormalities R06.00    R06.89   2. Multiple lung nodules on CT R91.8         Plan:       Agree with cardiac right heart cath later this month and cardiology visit Do HRCT supine and prone for above issues sometime in Oct 2019  Followup - return in 2 months to review cardiology findings - if no PAH or diast dysfn then need to see what followup CT will show in fall 2019 v get a CT sooner;  when done as HRCT  BUT also implement a weight loss plan  - return again in Oct 2019 after CT chest   (> 50% of this 15 min visit spent in face to face counseling or/and coordination of care)    Dr. Brand Males, M.D., [facility].C.P Pulmonary and Critical Care Medicine Staff Physician, [place] Director - Interstitial Lung Disease  Program  Pulmonary [place] at [place], [place], [address]  Pager: [phone], If no answer or between  15:00h - 7:00h: call [phone] Telephone: [phone]

## 2017-09-09 ENCOUNTER — Telehealth (HOSPITAL_COMMUNITY): Payer: Self-pay | Admitting: Cardiology

## 2017-09-09 NOTE — Telephone Encounter (Signed)
Patient called to cancel [place] on 09/12/17 Reports she was told her CKD is now a stage 4 Advised patient with a RHC only she would not receive dye and pre labs are done prior to procedure.  Patient would still like to cancel as her husband does not have time off from work, will call back to reschedule at a later time

## 2017-09-12 ENCOUNTER — Encounter (HOSPITAL_COMMUNITY): Admission: RE | Payer: Self-pay | Source: Ambulatory Visit

## 2017-09-12 ENCOUNTER — Ambulatory Visit (HOSPITAL_COMMUNITY)
Admission: RE | Admit: 2017-09-12 | Payer: BLUE CROSS/BLUE SHIELD | Source: Ambulatory Visit | Admitting: Internal Medicine

## 2017-09-12 SURGERY — RIGHT HEART CATH
Anesthesia: LOCAL

## 2017-09-13 ENCOUNTER — Telehealth (HOSPITAL_COMMUNITY): Payer: Self-pay | Admitting: *Deleted

## 2017-09-13 NOTE — Telephone Encounter (Signed)
Patient called back to reschedule her Right Heart Cath with exercise.  Patient has been rescheduled for April 29th @ 10:30.  Patient is aware to be at the main entrance at admissions by 8:30 AM.  No further questions.

## 2017-09-27 ENCOUNTER — Ambulatory Visit: Payer: BLUE CROSS/BLUE SHIELD | Admitting: Physician Assistant
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
## 2017-09-28 ENCOUNTER — Encounter (HOSPITAL_COMMUNITY): Payer: Self-pay | Admitting: Psychiatry

## 2017-09-28 ENCOUNTER — Ambulatory Visit (INDEPENDENT_AMBULATORY_CARE_PROVIDER_SITE_OTHER): Payer: BLUE CROSS/BLUE SHIELD | Admitting: Psychiatry

## 2017-09-28 VITALS — BP 128/83 | HR 68 | Ht 69.0 in | Wt 296.0 lb

## 2017-09-28 DIAGNOSIS — Z91410 Personal history of adult physical and sexual abuse: Secondary | ICD-10-CM

## 2017-09-28 DIAGNOSIS — Z975 Presence of (intrauterine) contraceptive device: Secondary | ICD-10-CM

## 2017-09-28 DIAGNOSIS — Z915 Personal history of self-harm: Secondary | ICD-10-CM

## 2017-09-28 DIAGNOSIS — M797 Fibromyalgia: Secondary | ICD-10-CM

## 2017-09-28 DIAGNOSIS — Z818 Family history of other mental and behavioral disorders: Secondary | ICD-10-CM | POA: Diagnosis not present

## 2017-09-28 DIAGNOSIS — R450 Nervousness: Secondary | ICD-10-CM

## 2017-09-28 DIAGNOSIS — Z87891 Personal history of nicotine dependence: Secondary | ICD-10-CM

## 2017-09-28 DIAGNOSIS — F410 Panic disorder [episodic paroxysmal anxiety] without agoraphobia: Secondary | ICD-10-CM | POA: Diagnosis not present

## 2017-09-28 DIAGNOSIS — F3181 Bipolar II disorder: Secondary | ICD-10-CM

## 2017-09-28 DIAGNOSIS — G8929 Other chronic pain: Secondary | ICD-10-CM | POA: Diagnosis not present

## 2017-09-28 MED ORDER — LURASIDONE HCL 40 MG PO TABS: 40.0000 mg | ORAL_TABLET | Freq: Every day | ORAL | 0 refills | Status: DC

## 2017-09-28 MED ORDER — BUPROPION HCL ER (XL) 150 MG PO TB24: 150.0000 mg | ORAL_TABLET | Freq: Every day | ORAL | 0 refills | Status: DC

## 2017-09-28 MED ORDER — DULOXETINE HCL 60 MG PO CPEP: 60.0000 mg | ORAL_CAPSULE | Freq: Two times a day (BID) | ORAL | 0 refills | Status: DC

## 2017-09-28 MED ORDER — LAMOTRIGINE 200 MG PO TABS: 200.0000 mg | ORAL_TABLET | Freq: Every day | ORAL | 0 refills | Status: DC

## 2017-09-28 NOTE — Patient Instructions (Addendum)
1. Continue lamotrigine 200 mg daily  2, Continue duloxetine 60 mg twice a day  3.Increaselatuda 40 mg daily 4. Continue Wellbutrin 150 mg daily 5. Return to clinic in one month for 15 mins

## 2017-10-03 ENCOUNTER — Ambulatory Visit (HOSPITAL_COMMUNITY)
Admission: RE | Admit: 2017-10-03 | Discharge: 2017-10-03 | Disposition: A | Discharge status: Home / Self Care | Payer: BLUE CROSS/BLUE SHIELD | Source: Ambulatory Visit | Attending: Internal Medicine | Admitting: Internal Medicine

## 2017-10-03 ENCOUNTER — Encounter (HOSPITAL_COMMUNITY)
Admission: RE | Disposition: A | Discharge status: Home / Self Care | Payer: Self-pay | Source: Ambulatory Visit | Attending: Internal Medicine

## 2017-10-03 DIAGNOSIS — Z539 Procedure and treatment not carried out, unspecified reason: Secondary | ICD-10-CM | POA: Insufficient documentation

## 2017-10-03 DIAGNOSIS — R0609 Other forms of dyspnea: Secondary | ICD-10-CM | POA: Insufficient documentation

## 2017-10-03 DIAGNOSIS — R0602 Shortness of breath: Secondary | ICD-10-CM | POA: Diagnosis present

## 2017-10-03 LAB — CBC
HCT: 43.8 % (ref 36.0–46.0)
Hemoglobin: 14 g/dL (ref 12.0–15.0)
MCH: 29 pg (ref 26.0–34.0)
MCHC: 32 g/dL (ref 30.0–36.0)
MCV: 90.7 fL (ref 78.0–100.0)
PLATELETS: 297 10*3/uL (ref 150–400)
RBC: 4.83 MIL/uL (ref 3.87–5.11)
RDW: 14.6 % (ref 11.5–15.5)
WBC: 8.1 10*3/uL (ref 4.0–10.5)

## 2017-10-03 LAB — BASIC METABOLIC PANEL
Anion gap: 11 (ref 5–15)
BUN: 27 mg/dL — AB (ref 6–20)
CHLORIDE: 97 mmol/L — AB (ref 101–111)
CO2: 29 mmol/L (ref 22–32)
CREATININE: 2.33 mg/dL — AB (ref 0.44–1.00)
Calcium: 10 mg/dL (ref 8.9–10.3)
GFR calc non Af Amer: 24 mL/min — ABNORMAL LOW (ref >60)
GFR, EST AFRICAN AMERICAN: 28 mL/min — AB (ref >60)
Glucose, Bld: 215 mg/dL — ABNORMAL HIGH (ref 65–99)
Potassium: 4.5 mmol/L (ref 3.5–5.1)
Sodium: 137 mmol/L (ref 135–145)

## 2017-10-03 LAB — GLUCOSE, CAPILLARY: GLUCOSE-CAPILLARY: 228 mg/dL — AB (ref 65–99)

## 2017-10-03 LAB — HCG, QUANTITATIVE, PREGNANCY: hCG, Beta Chain, Quant, S: 6 m[IU]/mL — ABNORMAL HIGH (ref <5)

## 2017-10-03 LAB — HCG, SERUM, QUALITATIVE: PREG SERUM: POSITIVE — AB

## 2017-10-03 SURGERY — RIGHT HEART CATH
Anesthesia: LOCAL

## 2017-10-03 MED ORDER — SODIUM CHLORIDE 0.9% FLUSH: 3.0000 mL | Freq: Two times a day (BID) | INTRAVENOUS | Status: DC

## 2017-10-03 MED ORDER — SODIUM CHLORIDE 0.9% FLUSH: 3.0000 mL | INTRAVENOUS | Status: DC | PRN

## 2017-10-03 MED ORDER — SODIUM CHLORIDE 0.9 % IV SOLN: 250.0000 mL | INTRAVENOUS | Status: DC | PRN

## 2017-10-03 MED ORDER — SODIUM CHLORIDE 0.9 % IV SOLN
INTRAVENOUS | Status: DC
  Administered 2017-10-03: 09:00:00 via INTRAVENOUS

## 2017-10-03 MED ORDER — ASPIRIN 81 MG PO CHEW
81.0000 mg | CHEWABLE_TABLET | ORAL | Status: AC
  Administered 2017-10-03: 81 mg via ORAL

## 2017-10-03 MED ORDER — ASPIRIN 81 MG PO CHEW
CHEWABLE_TABLET | ORAL | Status: AC
  Administered 2017-10-03: 81 mg via ORAL
  Filled 2017-10-03: qty 1

## 2017-10-27 ENCOUNTER — Ambulatory Visit: Payer: BLUE CROSS/BLUE SHIELD | Admitting: Physician Assistant

## 2017-11-02 ENCOUNTER — Ambulatory Visit (HOSPITAL_COMMUNITY): Payer: Self-pay | Admitting: Psychiatry

## 2017-11-07 ENCOUNTER — Ambulatory Visit: Payer: Self-pay | Admitting: Internal Medicine
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
## 2017-11-09 ENCOUNTER — Telehealth (HOSPITAL_COMMUNITY): Payer: Self-pay | Admitting: *Deleted

## 2017-11-09 ENCOUNTER — Other Ambulatory Visit (HOSPITAL_COMMUNITY): Payer: Self-pay | Admitting: *Deleted

## 2017-11-09 ENCOUNTER — Encounter (HOSPITAL_COMMUNITY): Payer: Self-pay | Admitting: *Deleted

## 2017-11-09 DIAGNOSIS — R0600 Dyspnea, unspecified: Secondary | ICD-10-CM

## 2017-11-09 NOTE — Telephone Encounter (Signed)
Patient called to reschedule her Cath with Exercise.  She has been scheduled for July 1st @ 3PM.  I'm mailing instructions to patient but we discussed over the phone as well.  She confirmed that she is not pregnant and she will bring her OBGYN office notes to cath incase her test comes back as a "weak positive" again. Patient is also on insulin pump and has appointment with MD next week.  She is going to discuss cath and NPO status to see what she needs to set her pump to the day of cath.

## 2017-11-14 ENCOUNTER — Ambulatory Visit (INDEPENDENT_AMBULATORY_CARE_PROVIDER_SITE_OTHER): Payer: BLUE CROSS/BLUE SHIELD | Admitting: Psychiatry

## 2017-11-14 VITALS — BP 127/78 | HR 71 | Ht 69.0 in | Wt 301.0 lb

## 2017-11-14 DIAGNOSIS — Z91120 Patient's intentional underdosing of medication regimen due to financial hardship: Secondary | ICD-10-CM

## 2017-11-14 DIAGNOSIS — G4733 Obstructive sleep apnea (adult) (pediatric): Secondary | ICD-10-CM

## 2017-11-14 DIAGNOSIS — K219 Gastro-esophageal reflux disease without esophagitis: Secondary | ICD-10-CM | POA: Diagnosis not present

## 2017-11-14 DIAGNOSIS — N183 Chronic kidney disease, stage 3 (moderate): Secondary | ICD-10-CM | POA: Diagnosis not present

## 2017-11-14 DIAGNOSIS — Z604 Social exclusion and rejection: Secondary | ICD-10-CM

## 2017-11-14 DIAGNOSIS — J45909 Unspecified asthma, uncomplicated: Secondary | ICD-10-CM

## 2017-11-14 DIAGNOSIS — Z818 Family history of other mental and behavioral disorders: Secondary | ICD-10-CM

## 2017-11-14 DIAGNOSIS — Z915 Personal history of self-harm: Secondary | ICD-10-CM

## 2017-11-14 DIAGNOSIS — E1022 Type 1 diabetes mellitus with diabetic chronic kidney disease: Secondary | ICD-10-CM | POA: Diagnosis not present

## 2017-11-14 DIAGNOSIS — F419 Anxiety disorder, unspecified: Secondary | ICD-10-CM | POA: Diagnosis not present

## 2017-11-14 DIAGNOSIS — M62838 Other muscle spasm: Secondary | ICD-10-CM | POA: Diagnosis not present

## 2017-11-14 DIAGNOSIS — Z87891 Personal history of nicotine dependence: Secondary | ICD-10-CM

## 2017-11-14 DIAGNOSIS — R454 Irritability and anger: Secondary | ICD-10-CM | POA: Diagnosis not present

## 2017-11-14 DIAGNOSIS — F3181 Bipolar II disorder: Secondary | ICD-10-CM

## 2017-11-14 DIAGNOSIS — R450 Nervousness: Secondary | ICD-10-CM

## 2017-11-14 DIAGNOSIS — Z560 Unemployment, unspecified: Secondary | ICD-10-CM

## 2017-11-14 DIAGNOSIS — M549 Dorsalgia, unspecified: Secondary | ICD-10-CM | POA: Diagnosis not present

## 2017-11-14 DIAGNOSIS — Z639 Problem related to primary support group, unspecified: Secondary | ICD-10-CM

## 2017-11-14 DIAGNOSIS — G8929 Other chronic pain: Secondary | ICD-10-CM | POA: Diagnosis not present

## 2017-11-14 DIAGNOSIS — K589 Irritable bowel syndrome without diarrhea: Secondary | ICD-10-CM

## 2017-11-14 MED ORDER — DULOXETINE HCL 60 MG PO CPEP: 60.0000 mg | ORAL_CAPSULE | Freq: Two times a day (BID) | ORAL | 0 refills | Status: DC

## 2017-11-14 MED ORDER — BUPROPION HCL ER (XL) 150 MG PO TB24: 150.0000 mg | ORAL_TABLET | Freq: Every day | ORAL | 0 refills | Status: DC

## 2017-11-14 MED ORDER — LAMOTRIGINE 200 MG PO TABS: 200.0000 mg | ORAL_TABLET | Freq: Every day | ORAL | 0 refills | Status: DC

## 2017-11-14 NOTE — Patient Instructions (Signed)
1. Continue lamotrigine 200 mg daily  2.  Continue duloxetine 60 mg twice a day  3. ContinueWellbutrin 150 mg daily 4. Return to clinic in three months for 15 mins - Bring your record from nephrologist at the next encounter

## 2017-12-05 ENCOUNTER — Encounter (HOSPITAL_COMMUNITY): Admission: RE | Payer: Self-pay | Source: Ambulatory Visit

## 2017-12-05 ENCOUNTER — Ambulatory Visit (HOSPITAL_COMMUNITY)
Admission: RE | Admit: 2017-12-05 | Payer: BLUE CROSS/BLUE SHIELD | Source: Ambulatory Visit | Admitting: Internal Medicine

## 2017-12-05 SURGERY — RIGHT HEART CATH
Anesthesia: LOCAL

## 2018-01-12 ENCOUNTER — Encounter (HOSPITAL_COMMUNITY): Payer: Self-pay | Admitting: Emergency Medicine

## 2018-01-12 ENCOUNTER — Emergency Department (HOSPITAL_COMMUNITY)
Admission: EM | Admit: 2018-01-12 | Discharge: 2018-01-12 | Disposition: A | Discharge status: Home / Self Care | Payer: BLUE CROSS/BLUE SHIELD | Attending: Emergency Medicine | Admitting: Emergency Medicine

## 2018-01-12 DIAGNOSIS — N182 Chronic kidney disease, stage 2 (mild): Secondary | ICD-10-CM | POA: Insufficient documentation

## 2018-01-12 DIAGNOSIS — I129 Hypertensive chronic kidney disease with stage 1 through stage 4 chronic kidney disease, or unspecified chronic kidney disease: Secondary | ICD-10-CM | POA: Insufficient documentation

## 2018-01-12 DIAGNOSIS — E1065 Type 1 diabetes mellitus with hyperglycemia: Secondary | ICD-10-CM | POA: Diagnosis present

## 2018-01-12 DIAGNOSIS — E785 Hyperlipidemia, unspecified: Secondary | ICD-10-CM | POA: Diagnosis not present

## 2018-01-12 DIAGNOSIS — Z7982 Long term (current) use of aspirin: Secondary | ICD-10-CM | POA: Insufficient documentation

## 2018-01-12 DIAGNOSIS — Z87891 Personal history of nicotine dependence: Secondary | ICD-10-CM | POA: Insufficient documentation

## 2018-01-12 DIAGNOSIS — E039 Hypothyroidism, unspecified: Secondary | ICD-10-CM | POA: Insufficient documentation

## 2018-01-12 DIAGNOSIS — R739 Hyperglycemia, unspecified: Secondary | ICD-10-CM

## 2018-01-12 DIAGNOSIS — Z79899 Other long term (current) drug therapy: Secondary | ICD-10-CM | POA: Diagnosis not present

## 2018-01-12 LAB — CBC WITH DIFFERENTIAL/PLATELET
Basophils Absolute: 0.1 10*3/uL (ref 0.0–0.1)
Basophils Relative: 1 %
Eosinophils Absolute: 0.2 10*3/uL (ref 0.0–0.7)
Eosinophils Relative: 3 %
HEMATOCRIT: 39.3 % (ref 36.0–46.0)
HEMOGLOBIN: 12.6 g/dL (ref 12.0–15.0)
LYMPHS ABS: 2.7 10*3/uL (ref 0.7–4.0)
Lymphocytes Relative: 30 %
MCH: 29.2 pg (ref 26.0–34.0)
MCHC: 32.1 g/dL (ref 30.0–36.0)
MCV: 91.2 fL (ref 78.0–100.0)
MONO ABS: 0.7 10*3/uL (ref 0.1–1.0)
MONOS PCT: 8 %
NEUTROS ABS: 5.2 10*3/uL (ref 1.7–7.7)
Neutrophils Relative %: 59 %
Platelets: 262 10*3/uL (ref 150–400)
RBC: 4.31 MIL/uL (ref 3.87–5.11)
RDW: 13.2 % (ref 11.5–15.5)
WBC: 8.8 10*3/uL (ref 4.0–10.5)

## 2018-01-12 LAB — BASIC METABOLIC PANEL
Anion gap: 8 (ref 5–15)
BUN: 30 mg/dL — AB (ref 6–20)
CALCIUM: 9 mg/dL (ref 8.9–10.3)
CHLORIDE: 98 mmol/L (ref 98–111)
CO2: 27 mmol/L (ref 22–32)
Creatinine, Ser: 2.12 mg/dL — ABNORMAL HIGH (ref 0.44–1.00)
GFR calc non Af Amer: 27 mL/min — ABNORMAL LOW (ref >60)
GFR, EST AFRICAN AMERICAN: 31 mL/min — AB (ref >60)
Glucose, Bld: 383 mg/dL — ABNORMAL HIGH (ref 70–99)
Potassium: 4.4 mmol/L (ref 3.5–5.1)
SODIUM: 133 mmol/L — AB (ref 135–145)

## 2018-01-12 LAB — LACTIC ACID, PLASMA
Lactic Acid, Venous: 1.3 mmol/L (ref 0.5–1.9)
Lactic Acid, Venous: 1.3 mmol/L (ref 0.5–1.9)

## 2018-01-12 LAB — I-STAT CHEM 8, ED
BUN: 30 mg/dL — AB (ref 6–20)
CHLORIDE: 96 mmol/L — AB (ref 98–111)
CREATININE: 2.1 mg/dL — AB (ref 0.44–1.00)
Calcium, Ion: 1.2 mmol/L (ref 1.15–1.40)
GLUCOSE: 381 mg/dL — AB (ref 70–99)
HEMATOCRIT: 38 % (ref 36.0–46.0)
Hemoglobin: 12.9 g/dL (ref 12.0–15.0)
POTASSIUM: 4.4 mmol/L (ref 3.5–5.1)
Sodium: 134 mmol/L — ABNORMAL LOW (ref 135–145)
TCO2: 27 mmol/L (ref 22–32)

## 2018-01-12 LAB — URINALYSIS, ROUTINE W REFLEX MICROSCOPIC
BACTERIA UA: NONE SEEN
BILIRUBIN URINE: NEGATIVE
Hgb urine dipstick: NEGATIVE
Ketones, ur: NEGATIVE mg/dL
LEUKOCYTES UA: NEGATIVE
NITRITE: NEGATIVE
PROTEIN: 30 mg/dL — AB
Specific Gravity, Urine: 1.006 (ref 1.005–1.030)
pH: 6 (ref 5.0–8.0)

## 2018-01-12 LAB — BLOOD GAS, VENOUS
Acid-Base Excess: 2.8 mmol/L — ABNORMAL HIGH (ref 0.0–2.0)
BICARBONATE: 26.1 mmol/L (ref 20.0–28.0)
Drawn by: 4252
O2 SAT: 97.7 %
PATIENT TEMPERATURE: 37
PH VEN: 7.344 (ref 7.250–7.430)
PO2 VEN: 114 mmHg — AB (ref 32.0–45.0)
pCO2, Ven: 52.8 mmHg (ref 44.0–60.0)

## 2018-01-12 LAB — PREGNANCY, URINE: Preg Test, Ur: NEGATIVE

## 2018-01-12 LAB — CBG MONITORING, ED
Glucose-Capillary: 369 mg/dL — ABNORMAL HIGH (ref 70–99)
Glucose-Capillary: 293 mg/dL — ABNORMAL HIGH (ref 70–99)

## 2018-01-12 MED ORDER — SODIUM CHLORIDE 0.9 % IV BOLUS
1000.0000 mL | Freq: Once | INTRAVENOUS | Status: AC
  Administered 2018-01-12: 1000 mL via INTRAVENOUS

## 2018-01-12 NOTE — ED Triage Notes (Signed)
Pt C/O hyperglycemia since yesterday AM. Pt BS on EMS 425. Pt given 535ml bolus en route.

## 2018-01-12 NOTE — ED Provider Notes (Signed)
[facility] EMERGENCY DEPARTMENT Provider Note   CSN: [id number] Arrival date & time: 01/12/18  0137     History   Chief Complaint Chief Complaint  Patient presents with  . Hyperglycemia    HPI Jasmine [name] is a 47 y.o. female.  Patient with diabetes with insulin pump in place presenting with hyperglycemia.  States her sugars been in the 400 range at home.  She had a pump malfunction last night when the tubing kinked but she was able to replace this and repair it.  She states the pump is working now.  She denies any nausea, vomiting, fever.  No pain with urination or blood in the urine.  No chest pain or shortness of breath.  She states her insulin pump is working appropriately.  She has some nausea but that is since resolved.  Patient with history of CKD, hypertension, hyperlipidemia and fibromyalgia.  The history is provided by the patient.  Hyperglycemia  Associated symptoms: weakness   Associated symptoms: no abdominal pain, no chest pain, no dizziness, no dysuria, no fatigue, no fever, no nausea, no shortness of breath and no vomiting     Past Medical History:  Diagnosis Date  . Anxiety   . Bipolar disorder ([place])   . Chronic fatigue   . CKD (chronic kidney disease), stage II   . Depression   . Diabetes mellitus   . DKA, type 1 ([place]) 11/04/2011  . Elevated cholesterol   . Fibromyalgia   . GERD (gastroesophageal reflux disease)   . Headache   . Hyperlipemia   . Hypertension   . Hypothyroidism   . IBS (irritable bowel syndrome)   . Obesity   . Sleep apnea    HAS C -PAP / DOES NOT USE  . Stress incontinence    Pt had surgery to correct this.  . Tachycardia   . Tobacco abuse   . UTI (lower urinary tract infection)     Patient Active Problem List   Diagnosis Date Noted  . Chronic migraine 01/17/2017  . Diabetic peripheral neuropathy ([place]) 01/17/2017  . OSA (obstructive sleep apnea) 07/27/2016  . Wheezing 07/27/2016  . Hyperglycemia 12/25/2012  . Acute on  chronic renal failure ([place]) 12/25/2012  . Pulmonary infiltrates 10/02/2012  . Postnasal drip 10/02/2012  . Hyperkalemia 09/25/2012  . Chronic kidney disease, stage III (moderate) ([place]) 09/25/2012  . Morbid obesity ([place]) 09/24/2012  . HTN (hypertension) 09/24/2012  . Bipolar II disorder ([place]) 09/24/2012  . URI (upper respiratory infection) 09/24/2012  . DKA, type 1 ([place]) 11/04/2011  . Gastroenteritis 11/03/2011  . DM (diabetes mellitus), type 1, uncontrolled ([place]) 11/03/2011  . Hyponatremia 11/03/2011  . CKD (chronic kidney disease), stage II 11/03/2011  . Elevated lipase 11/03/2011  . Hypothyroidism 11/03/2011  . Tobacco abuse 11/03/2011  . SUI (stress urinary incontinence, female) 08/16/2011    Past Surgical History:  Procedure Laterality Date  . INCONTINENCE SURGERY    . NASAL FRACTURE SURGERY    . ovary removed    . OVARY SURGERY    . PUBOVAGINAL SLING  08/16/2011   Procedure: [name];  Surgeon: Bernestine Amass, MD;  Location: [facility] ORS;  Service: Urology;  Laterality: N/A;         . UTERINE FIBROID SURGERY  2001     OB History    Gravida  1   Para      Term      Preterm      AB  Living        SAB      TAB      Ectopic      Multiple      Live Births               Home Medications    Prior to Admission medications   Medication Sig Start Date End Date Taking? Authorizing Provider  acetaminophen (TYLENOL) 500 MG tablet Take 1,000 mg by mouth every 6 (six) hours as needed for moderate pain or headache.     [provider]  aspirin EC 81 MG tablet Take 81 mg by mouth at bedtime.     [provider]  buPROPion (WELLBUTRIN XL) 150 MG 24 hr tablet Take 1 tablet (150 mg total) by mouth daily. 11/14/17   Norman Clay, MD  carvedilol (COREG) 25 MG tablet Take 37.5 mg by mouth 2 (two) times daily with a meal.     [provider]  cholecalciferol (VITAMIN D) 1000 UNITS tablet Take 1,000 Units by mouth daily.     [provider]  Coenzyme Q10 (CO Q 10 PO) Take 50 mg by mouth every evening.     [provider]  diazepam (VALIUM) 5 MG tablet Take 5 mg by mouth 2 (two) times daily as needed for anxiety or muscle spasms.     [provider]  Diclofenac Sodium (PENNSAID) 2 % SOLN Place 1 application onto the skin daily as needed (for back pain).    [provider]  DULoxetine (CYMBALTA) 60 MG capsule Take 1 capsule (60 mg total) by mouth 2 (two) times daily. 11/14/17   Norman Clay, MD  fluticasone (FLONASE) 50 MCG/ACT nasal spray Place 2 sprays into the nose daily as needed for allergies.     [provider]  folic acid (FOLVITE) 829 MCG tablet Take 400 mcg by mouth every evening.     [provider]  furosemide (LASIX) 40 MG tablet Take 40 mg by mouth daily.     [provider]  gabapentin (NEURONTIN) 300 MG capsule Take 300 mg by mouth at bedtime.     [provider]  lamoTRIgine (LAMICTAL) 200 MG tablet Take 1 tablet (200 mg total) by mouth daily. 11/14/17   Norman Clay, MD  levothyroxine (SYNTHROID, LEVOTHROID) 150 MCG tablet Take 150 mcg by mouth daily before breakfast.    [provider]  lubiprostone (AMITIZA) 8 MCG capsule Take 8 mcg by mouth 2 (two) times a week.     [provider]  Naphazoline HCl (CLEAR EYES OP) Place 2 drops into both eyes 3 (three) times daily as needed (for dry eyes).    [provider]  NOVOLOG 100 UNIT/ML injection USE AS DIRECTED FOR INSULIN PUMP MAX DAILY DOSE 120 UNITS PER DAY 02/24/17   [provider]  Omega-3 Fatty Acids (FISH OIL PO) Take 1 capsule by mouth daily.    [provider]  omeprazole (PRILOSEC) 20 MG capsule Take 20 mg by mouth at bedtime.     [provider]  ondansetron (ZOFRAN ODT) 4 MG disintegrating tablet Take 1 tablet (4 mg total) by mouth every 8 (eight) hours as needed. Patient taking differently: Take 4 mg by mouth every 8  (eight) hours as needed for nausea or vomiting.  01/17/17   Marcial Pacas, MD  oxyCODONE-acetaminophen (PERCOCET/ROXICET) 5-325 MG tablet Take 2 tablets every 4 (four) hours as needed by mouth for severe pain. Patient not taking: Reported on 11/28/2017 04/15/17  Caryl Ada K, PA-C  pravastatin (PRAVACHOL) 80 MG tablet Take 80 mg by mouth at bedtime.    [provider]  PROAIR HFA 108 (90 Base) MCG/ACT inhaler INHALE TWO PUFFS INTO THE LUNGS EVERY 6 HOURS AS NEEDED FOR WHEEZING OR SHORTNESS OF BREATH 12/30/16   [provider]  rizatriptan (MAXALT-MLT) 10 MG disintegrating tablet Take 1 tablet (10 mg total) as needed by mouth. May repeat in 2 hours if needed Patient taking differently: Take 10 mg by mouth as needed for migraine. May repeat in 2 hours if needed 04/20/17   Marcial Pacas, MD  sodium chloride (OCEAN) 0.65 % SOLN nasal spray Place 2 sprays into both nostrils as needed for congestion.    [provider]  traMADol (ULTRAM) 50 MG tablet Take 50 mg by mouth 2 (two) times daily as needed for moderate pain.     [provider]    Family History Family History  Problem Relation Age of Onset  . Asthma Mother   . Bipolar disorder Mother   . Heart disease Father   . Lymphoma Father   . Hypertension Father   . Thyroid disease Father   . Hyperlipidemia Father   . Diabetes Father   . Cancer Paternal Grandmother        lung and breast  . Bladder Cancer Paternal Grandfather   . Suicidality Maternal Grandfather   . Thyroid disease Brother     Social History Social History   Tobacco Use  . Smoking status: Former Smoker    Packs/day: 0.75    Years: 20.00    Pack years: 15.00    Types: Cigarettes    Last attempt to quit: 06/07/2012    Years since quitting: 5.6  . Smokeless tobacco: Never Used  Substance Use Topics  . Alcohol use: No  . Drug use: No     Allergies   Ciprofloxacin; Levaquin [levofloxacin]; Buspar [buspirone]; Advair diskus  [fluticasone-salmeterol]; Biaxin [clarithromycin]; and Hydroxyzine   Review of Systems Review of Systems  Constitutional: Positive for activity change and appetite change. Negative for fatigue and fever.  HENT: Negative for congestion and nosebleeds.   Eyes: Negative for visual disturbance.  Respiratory: Negative for cough, chest tightness and shortness of breath.   Cardiovascular: Negative for chest pain and leg swelling.  Gastrointestinal: Negative for abdominal pain, nausea and vomiting.  Genitourinary: Negative for dysuria, flank pain, vaginal bleeding and vaginal discharge.  Musculoskeletal: Negative for arthralgias, back pain and myalgias.  Skin: Negative for rash.  Neurological: Positive for weakness. Negative for dizziness, light-headedness and headaches.   all other systems are negative except as noted in the HPI and PMH.     Physical Exam Updated Vital Signs BP (!) 132/59   Pulse 62   Temp 98 F (36.7 C) (Oral)   Resp 19   Wt 136 kg   SpO2 90%   BMI 44.28 kg/m   Physical Exam  Constitutional: She is oriented to person, place, and time. She appears well-developed and well-nourished. No distress.  HENT:  Head: Normocephalic and atraumatic.  Mouth/Throat: Oropharynx is clear and moist. No oropharyngeal exudate.  Eyes: Pupils are equal, round, and reactive to light. Conjunctivae and EOM are normal.  Neck: Normal range of motion. Neck supple.  No meningismus.  Cardiovascular: Normal rate, regular rhythm, normal heart sounds and intact distal pulses.  No murmur heard. Pulmonary/Chest: Effort normal and breath sounds normal. No respiratory distress.  Abdominal: Soft. There is no tenderness. There is no rebound  and no guarding.  Musculoskeletal: Normal range of motion. She exhibits no edema or tenderness.  No CVAT  Neurological: She is alert and oriented to person, place, and time. No cranial nerve deficit. She exhibits normal muscle tone. Coordination normal.  No  ataxia on finger to nose bilaterally. No pronator drift. 5/5 strength throughout. CN 2-12 intact.Equal grip strength. Sensation intact.   Skin: Skin is warm.  Psychiatric: She has a normal mood and affect. Her behavior is normal.  Nursing note and vitals reviewed.    ED Treatments / Results  Labs (all labs ordered are listed, but only abnormal results are displayed) Labs Reviewed  URINALYSIS, ROUTINE W REFLEX MICROSCOPIC - Abnormal; Notable for the following components:      Result Value   Color, Urine STRAW (*)    Glucose, UA >=500 (*)    Protein, ur 30 (*)    All other components within normal limits  BLOOD GAS, VENOUS - Abnormal; Notable for the following components:   pO2, Ven 114.0 (*)    Acid-Base Excess 2.8 (*)    All other components within normal limits  BASIC METABOLIC PANEL - Abnormal; Notable for the following components:   Sodium 133 (*)    Glucose, Bld 383 (*)    BUN 30 (*)    Creatinine, Ser 2.12 (*)    GFR calc non Af Amer 27 (*)    GFR calc Af Amer 31 (*)    All other components within normal limits  CBG MONITORING, ED - Abnormal; Notable for the following components:   Glucose-Capillary 293 (*)    All other components within normal limits  CBC WITH DIFFERENTIAL/PLATELET  LACTIC ACID, PLASMA  LACTIC ACID, PLASMA  PREGNANCY, URINE  I-STAT CHEM 8, ED  CBG MONITORING, ED  CBG MONITORING, ED    EKG None  Radiology No results found.  Procedures Procedures (including critical care time)  Medications Ordered in ED Medications  sodium chloride 0.9 % bolus 1,000 mL (1,000 mLs Intravenous New Bag/Given 01/12/18 0259)     Initial Impression / Assessment and Plan / ED Course  I have reviewed the triage vital signs and the nursing notes.  Pertinent labs & imaging results that were available during my care of the patient were reviewed by me and considered in my medical decision making (see chart for details).    Type I diabetic with insulin pump in  place.  Here with nausea and hyperglycemia.  Pump is currently functioning appropriately.  Given IV fluids.  She is hyperglycemic but not in DKA.  Anion gap is normal. Patient's creatinine is improved from her baseline.  She is monitored in the ED given IV fluid.  She is in no distress and tolerating p.o.  Glucose is improved to 293.  Her insulin pump is working appropriately by her reports.  She will follow-up with her PCP.  Return precautions discussed.  Final Clinical Impressions(s) / ED Diagnoses   Final diagnoses:  Hyperglycemia    ED Discharge Orders    None       Antoni Stefan, Annie Main, MD 01/12/18 [phone]

## 2018-01-12 NOTE — Discharge Instructions (Addendum)
You are not in DKA.  Take your medications as prescribed and keep yourself hydrated.  Follow-up with your doctor.  Return to the ED if you develop new or worsening symptoms.

## 2018-01-12 NOTE — ED Notes (Signed)
Date and time results received: 01/12/18 0234   Test: POC CBG Critical Value: 369  Name of Provider Notified: Rancour, MD

## 2018-02-09 ENCOUNTER — Other Ambulatory Visit (HOSPITAL_COMMUNITY): Payer: Self-pay | Admitting: Psychiatry

## 2018-02-09 MED ORDER — BUPROPION HCL ER (XL) 150 MG PO TB24: 150.0000 mg | ORAL_TABLET | Freq: Every day | ORAL | 0 refills | Status: DC
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
## 2018-02-14 ENCOUNTER — Ambulatory Visit (INDEPENDENT_AMBULATORY_CARE_PROVIDER_SITE_OTHER): Payer: BLUE CROSS/BLUE SHIELD | Admitting: Psychiatry

## 2018-02-14 ENCOUNTER — Encounter (HOSPITAL_COMMUNITY): Payer: Self-pay | Admitting: Psychiatry

## 2018-02-14 VITALS — BP 134/80 | HR 79 | Ht 69.0 in | Wt 308.0 lb

## 2018-02-14 DIAGNOSIS — Z736 Limitation of activities due to disability: Secondary | ICD-10-CM

## 2018-02-14 DIAGNOSIS — Z818 Family history of other mental and behavioral disorders: Secondary | ICD-10-CM | POA: Diagnosis not present

## 2018-02-14 DIAGNOSIS — Z87891 Personal history of nicotine dependence: Secondary | ICD-10-CM | POA: Diagnosis not present

## 2018-02-14 DIAGNOSIS — F3181 Bipolar II disorder: Secondary | ICD-10-CM

## 2018-02-14 DIAGNOSIS — F419 Anxiety disorder, unspecified: Secondary | ICD-10-CM

## 2018-02-14 DIAGNOSIS — R45851 Suicidal ideations: Secondary | ICD-10-CM

## 2018-02-14 MED ORDER — LAMOTRIGINE 200 MG PO TABS: 200.0000 mg | ORAL_TABLET | Freq: Every day | ORAL | 0 refills | Status: DC

## 2018-02-14 MED ORDER — DULOXETINE HCL 60 MG PO CPEP: 60.0000 mg | ORAL_CAPSULE | Freq: Two times a day (BID) | ORAL | 0 refills | Status: DC

## 2018-02-14 MED ORDER — BUPROPION HCL ER (XL) 300 MG PO TB24: 300.0000 mg | ORAL_TABLET | Freq: Every day | ORAL | 0 refills | Status: DC

## 2018-02-14 NOTE — Patient Instructions (Signed)
1. Continue lamotrigine 200 mg daily  2.  Continue duloxetine 60 mg twice a day  3. ContinueWellbutrin 300 mg daily 4. Return to clinic in one month for 30 mins  CONTACT INFORMATION  What to do if you need to get in touch with someone regarding a psychiatric issue:  1. EMERGENCY: For psychiatric emergencies (if you are suicidal or if there are any other safety issues) call 911 and/or go to your nearest Emergency Room immediately.   2. IF YOU NEED SOMEONE TO TALK TO RIGHT NOW: Given my clinical responsibilities, I may not be able to speak with you over the phone for a prolonged period of time.  a. You may always call The National Suicide Prevention Lifeline at 1-800-273-TALK 909-646-3746).  b. Your county of residence will also have local crisis services. For [facility]: [place] at [phone] ([place])

## 2018-02-20 ENCOUNTER — Telehealth: Payer: Self-pay | Admitting: Internal Medicine

## 2018-02-20 NOTE — Telephone Encounter (Signed)
MR please advise patient is supposed to have a CT scan follow up done in November. She stated that she will no longer have insurance after October 31st. Can this be done sooner so that insurance will cover scan. Please advise, thank you.

## 2018-02-20 NOTE — Telephone Encounter (Signed)
I wanted HRCT supine/prone in Oct 2019. So ok to do anytime between now and 04/06/18  She was supposed to get Right heart cath. Did she do it? I cannot see any evidence she did it  Thanks    SIGNATURE    Dr. Brand Males, M.D., F.C.C.P,  Pulmonary and Critical Care Medicine Staff Physician, [place] Director - Interstitial Lung Disease  Program  Pulmonary [place] at [place], [place], [address]  Pager: [phone], If no answer or between  15:00h - 7:00h: call [phone] Telephone: [phone]  5:22 PM 02/20/2018

## 2018-02-20 NOTE — Telephone Encounter (Signed)
Called and spoke with patient, she stated that she did not have the heart cath done due to blood work being abnormal, she also stated that she never went back to have it rechecked so that she could have the heart cath done. Will route to MR.   [facility] please advise,can you go ahead and schedule the HRCT for this patient before October 31st

## 2018-02-21 NOTE — Telephone Encounter (Signed)
1. Yes pleas get HRCT and arrane for followup with me  2. [facility] - why did she cancel 3 times. ? Not sure what blood test was abnormal. Can yo uask her please  Thanks  MR

## 2018-02-21 NOTE — Telephone Encounter (Signed)
Spoke with pt, she stated she had 3 positive pregnancy test and they didn't want to perform the procedure. She ended up not being pregnant but states she doubts if she will get the heart cath. This message was sent to the [facility] to schedule CT before 04/06/2018. FYI MR

## 2018-02-22 NOTE — Telephone Encounter (Signed)
I will call this patient to schedule

## 2018-02-23 NOTE — Telephone Encounter (Signed)
   Results for Jasmine [name], Jasmine [name] (MRN [id number]) as of 02/23/2018 11:28  Ref. Range 10/16/2015 01:44 10/26/2015 19:14 10/29/2015 04:38 04/30/2017 12:07 01/12/2018 04:00  Preg Test, Ur Latest Ref Range: NEGATIVE  NEGATIVE NEGATIVE NEGATIVE NEGATIVE NEGATIVE    All I am seeing is negative preg test. Anyways, please have her do HRCT and return to see me for followup and then we will decidef if she needs right heart cath     SIGNATURE    Dr. Brand Males, M.D., F.C.C.P,  Pulmonary and Critical Care Medicine Staff Physician, [place] Director - Interstitial Lung Disease  Program  Pulmonary [place] at [place], [place], [address]  Pager: [phone], If no answer or between  15:00h - 7:00h: call [phone] Telephone: [phone]  11:29 AM 02/23/2018

## 2018-03-01 ENCOUNTER — Other Ambulatory Visit: Payer: Self-pay

## 2018-03-01 ENCOUNTER — Encounter (HOSPITAL_COMMUNITY): Payer: Self-pay | Admitting: Emergency Medicine

## 2018-03-01 ENCOUNTER — Emergency Department (HOSPITAL_COMMUNITY)
Admission: EM | Admit: 2018-03-01 | Discharge: 2018-03-01 | Disposition: A | Discharge status: Home / Self Care | Payer: BLUE CROSS/BLUE SHIELD | Attending: Emergency Medicine | Admitting: Emergency Medicine

## 2018-03-01 DIAGNOSIS — Z794 Long term (current) use of insulin: Secondary | ICD-10-CM | POA: Insufficient documentation

## 2018-03-01 DIAGNOSIS — Z79899 Other long term (current) drug therapy: Secondary | ICD-10-CM | POA: Diagnosis not present

## 2018-03-01 DIAGNOSIS — R739 Hyperglycemia, unspecified: Secondary | ICD-10-CM

## 2018-03-01 DIAGNOSIS — N183 Chronic kidney disease, stage 3 (moderate): Secondary | ICD-10-CM | POA: Insufficient documentation

## 2018-03-01 DIAGNOSIS — E1065 Type 1 diabetes mellitus with hyperglycemia: Secondary | ICD-10-CM | POA: Diagnosis not present

## 2018-03-01 DIAGNOSIS — Z7982 Long term (current) use of aspirin: Secondary | ICD-10-CM | POA: Insufficient documentation

## 2018-03-01 DIAGNOSIS — Z87891 Personal history of nicotine dependence: Secondary | ICD-10-CM | POA: Insufficient documentation

## 2018-03-01 DIAGNOSIS — I129 Hypertensive chronic kidney disease with stage 1 through stage 4 chronic kidney disease, or unspecified chronic kidney disease: Secondary | ICD-10-CM | POA: Diagnosis not present

## 2018-03-01 LAB — COMPREHENSIVE METABOLIC PANEL
ALT: 17 U/L (ref 0–44)
ANION GAP: 13 (ref 5–15)
AST: 15 U/L (ref 15–41)
Albumin: 3.5 g/dL (ref 3.5–5.0)
Alkaline Phosphatase: 126 U/L (ref 38–126)
BUN: 33 mg/dL — ABNORMAL HIGH (ref 6–20)
CO2: 25 mmol/L (ref 22–32)
Calcium: 8.8 mg/dL — ABNORMAL LOW (ref 8.9–10.3)
Chloride: 90 mmol/L — ABNORMAL LOW (ref 98–111)
Creatinine, Ser: 2 mg/dL — ABNORMAL HIGH (ref 0.44–1.00)
GFR calc Af Amer: 33 mL/min — ABNORMAL LOW (ref >60)
GFR, EST NON AFRICAN AMERICAN: 29 mL/min — AB (ref >60)
Glucose, Bld: 747 mg/dL (ref 70–99)
Potassium: 4.9 mmol/L (ref 3.5–5.1)
Sodium: 128 mmol/L — ABNORMAL LOW (ref 135–145)
TOTAL PROTEIN: 6.7 g/dL (ref 6.5–8.1)
Total Bilirubin: 1.1 mg/dL (ref 0.3–1.2)

## 2018-03-01 LAB — CBC WITH DIFFERENTIAL/PLATELET
BASOS ABS: 0.1 10*3/uL (ref 0.0–0.1)
BASOS PCT: 1 %
EOS ABS: 0.2 10*3/uL (ref 0.0–0.7)
EOS PCT: 2 %
HCT: 41.5 % (ref 36.0–46.0)
HEMOGLOBIN: 12.8 g/dL (ref 12.0–15.0)
LYMPHS PCT: 18 %
Lymphs Abs: 1.9 10*3/uL (ref 0.7–4.0)
MCH: 29.6 pg (ref 26.0–34.0)
MCHC: 30.8 g/dL (ref 30.0–36.0)
MCV: 96.1 fL (ref 78.0–100.0)
Monocytes Absolute: 0.7 10*3/uL (ref 0.1–1.0)
Monocytes Relative: 7 %
NEUTROS ABS: 7.5 10*3/uL (ref 1.7–7.7)
Neutrophils Relative %: 72 %
Platelets: 239 10*3/uL (ref 150–400)
RBC: 4.32 MIL/uL (ref 3.87–5.11)
RDW: 13.7 % (ref 11.5–15.5)
WBC: 10.4 10*3/uL (ref 4.0–10.5)

## 2018-03-01 LAB — BLOOD GAS, VENOUS
Acid-base deficit: 1.2 mmol/L (ref 0.0–2.0)
BICARBONATE: 22.1 mmol/L (ref 20.0–28.0)
FIO2: 0.21
O2 SAT: 75.1 %
PATIENT TEMPERATURE: 37
PO2 VEN: 47.5 mmHg — AB (ref 32.0–45.0)
pCO2, Ven: 54 mmHg (ref 44.0–60.0)
pH, Ven: 7.281 (ref 7.250–7.430)

## 2018-03-01 LAB — URINALYSIS, ROUTINE W REFLEX MICROSCOPIC
BILIRUBIN URINE: NEGATIVE
Glucose, UA: >=500 mg/dL — AB
HGB URINE DIPSTICK: NEGATIVE
KETONES UR: 5 mg/dL — AB
LEUKOCYTES UA: NEGATIVE
NITRITE: NEGATIVE
PH: 6 (ref 5.0–8.0)
Protein, ur: NEGATIVE mg/dL
SPECIFIC GRAVITY, URINE: 1.012 (ref 1.005–1.030)

## 2018-03-01 LAB — CBG MONITORING, ED
Glucose-Capillary: 437 mg/dL — ABNORMAL HIGH (ref 70–99)
Glucose-Capillary: 314 mg/dL — ABNORMAL HIGH (ref 70–99)

## 2018-03-01 MED ORDER — INSULIN ASPART 100 UNIT/ML ~~LOC~~ SOLN
10.0000 [IU] | Freq: Once | SUBCUTANEOUS | Status: AC
  Administered 2018-03-01: 10 [IU] via INTRAVENOUS
  Filled 2018-03-01: qty 1

## 2018-03-01 MED ORDER — INSULIN ASPART 100 UNIT/ML ~~LOC~~ SOLN
8.0000 [IU] | Freq: Once | SUBCUTANEOUS | Status: AC
  Administered 2018-03-01: 8 [IU] via INTRAVENOUS
  Filled 2018-03-01: qty 1

## 2018-03-01 MED ORDER — SODIUM CHLORIDE 0.9 % IV BOLUS
1000.0000 mL | Freq: Once | INTRAVENOUS | Status: AC
  Administered 2018-03-01: 1000 mL via INTRAVENOUS

## 2018-03-01 NOTE — ED Provider Notes (Signed)
[facility] EMERGENCY DEPARTMENT Provider Note   CSN: [id number] Arrival date & time: 03/01/18  7253     History   Chief Complaint Chief Complaint  Patient presents with  . Hyperglycemia    HPI Jasmine [name] is a 47 y.o. female.  HPI Patient presents with hyperglycemia.  She is on an insulin pump for diabetes.  States her sugars been up and down between 300 and 40 over the last few days.  Has had some nausea at times.  Sugar was greater than 600 this morning.  Feels little lightheaded.  No dysuria but has had urinary tract infections without symptoms in the past.  No abdominal pain.  States that her sugar is high this morning because the tubing on the pump broke last night. Past Medical History:  Diagnosis Date  . Anxiety   . Bipolar disorder ([place])   . Chronic fatigue   . CKD (chronic kidney disease), stage II   . Depression   . Diabetes mellitus   . DKA, type 1 ([place]) 11/04/2011  . Elevated cholesterol   . Fibromyalgia   . GERD (gastroesophageal reflux disease)   . Headache   . Hyperlipemia   . Hypertension   . Hypothyroidism   . IBS (irritable bowel syndrome)   . Obesity   . Sleep apnea    HAS C -PAP / DOES NOT USE  . Stress incontinence    Pt had surgery to correct this.  . Tachycardia   . Tobacco abuse   . UTI (lower urinary tract infection)     Patient Active Problem List   Diagnosis Date Noted  . Chronic migraine 01/17/2017  . Diabetic peripheral neuropathy ([place]) 01/17/2017  . OSA (obstructive sleep apnea) 07/27/2016  . Wheezing 07/27/2016  . Hyperglycemia 12/25/2012  . Acute on chronic renal failure ([place]) 12/25/2012  . Pulmonary infiltrates 10/02/2012  . Postnasal drip 10/02/2012  . Hyperkalemia 09/25/2012  . Chronic kidney disease, stage III (moderate) ([place]) 09/25/2012  . Morbid obesity ([place]) 09/24/2012  . HTN (hypertension) 09/24/2012  . Bipolar II disorder ([place]) 09/24/2012  . URI (upper respiratory infection) 09/24/2012  . DKA, type 1 ([place])  11/04/2011  . Gastroenteritis 11/03/2011  . DM (diabetes mellitus), type 1, uncontrolled ([place]) 11/03/2011  . Hyponatremia 11/03/2011  . CKD (chronic kidney disease), stage II 11/03/2011  . Elevated lipase 11/03/2011  . Hypothyroidism 11/03/2011  . Tobacco abuse 11/03/2011  . SUI (stress urinary incontinence, female) 08/16/2011    Past Surgical History:  Procedure Laterality Date  . INCONTINENCE SURGERY    . NASAL FRACTURE SURGERY    . ovary removed    . OVARY SURGERY    . PUBOVAGINAL SLING  08/16/2011   Procedure: [name];  Surgeon: Bernestine Amass, MD;  Location: [facility]S;  Service: Urology;  Laterality: N/A;         . UTERINE FIBROID SURGERY  2001     OB History    Gravida  1   Para      Term      Preterm      AB      Living        SAB      TAB      Ectopic      Multiple      Live Births               Home Medications    Prior to Admission medications   Medication Sig Start Date End Date  Taking? Authorizing Provider  acetaminophen (TYLENOL) 500 MG tablet Take 1,000 mg by mouth every 6 (six) hours as needed for moderate pain or headache.    Yes [provider]  aspirin EC 81 MG tablet Take 81 mg by mouth at bedtime.    Yes [provider]  Biotin 1 MG CAPS Take 1 capsule by mouth daily.   Yes [provider]  buPROPion (WELLBUTRIN XL) 300 MG 24 hr tablet Take 1 tablet (300 mg total) by mouth daily. 02/14/18  Yes Hisada, Elie Goody, MD  carvedilol (COREG) 25 MG tablet Take 37.5 mg by mouth 2 (two) times daily with a meal.    Yes [provider]  cholecalciferol (VITAMIN D) 1000 UNITS tablet Take 1,000 Units by mouth daily.   Yes [provider]  Coenzyme Q10 (CO Q 10 PO) Take 50 mg by mouth every evening.    Yes [provider]  diazepam (VALIUM) 5 MG tablet Take 1.25 mg by mouth 2 (two) times daily as needed for anxiety or muscle spasms.    Yes [provider]  Diclofenac Sodium  (PENNSAID) 2 % SOLN Place 1 application onto the skin daily as needed (for back pain).   Yes [provider]  dicyclomine (BENTYL) 10 MG capsule Take 10 mg by mouth 3 (three) times daily as needed for spasms.   Yes [provider]  DULoxetine (CYMBALTA) 60 MG capsule Take 1 capsule (60 mg total) by mouth 2 (two) times daily. 02/14/18  Yes Hisada, Elie Goody, MD  fluticasone (FLONASE) 50 MCG/ACT nasal spray Place 2 sprays into the nose daily as needed for allergies.    Yes [provider]  folic acid (FOLVITE) 836 MCG tablet Take 400 mcg by mouth every evening.    Yes [provider]  furosemide (LASIX) 40 MG tablet Take 40 mg by mouth daily.    Yes [provider]  gabapentin (NEURONTIN) 300 MG capsule Take 300 mg by mouth at bedtime.    Yes [provider]  lamoTRIgine (LAMICTAL) 200 MG tablet Take 1 tablet (200 mg total) by mouth daily. 02/14/18  Yes Hisada, Elie Goody, MD  levothyroxine (SYNTHROID, LEVOTHROID) 150 MCG tablet Take 150 mcg by mouth daily before breakfast.   Yes [provider]  lurasidone (LATUDA) 40 MG TABS tablet Take 1 tablet by mouth daily.   Yes [provider]  Naphazoline HCl (CLEAR EYES OP) Place 2 drops into both eyes 3 (three) times daily as needed (for dry eyes).   Yes [provider]  NOVOLOG 100 UNIT/ML injection USE AS DIRECTED FOR INSULIN PUMP MAX DAILY DOSE 120 UNITS PER DAY 02/24/17  Yes [provider]  Omega-3 Fatty Acids (FISH OIL PO) Take 1 capsule by mouth daily.   Yes [provider]  omeprazole (PRILOSEC) 20 MG capsule Take 20 mg by mouth at bedtime.    Yes [provider]  ondansetron (ZOFRAN ODT) 4 MG disintegrating tablet Take 1 tablet (4 mg total) by mouth every 8 (eight) hours as needed. Patient taking differently: Take 4 mg by mouth every 8 (eight) hours as needed for nausea or vomiting.  01/17/17  Yes Marcial Pacas, MD  oxyCODONE-acetaminophen (PERCOCET/ROXICET)  5-325 MG tablet Take 2 tablets every 4 (four) hours as needed by mouth for severe pain. 04/15/17  Yes Caryl Ada K, PA-C  pravastatin (PRAVACHOL) 80 MG tablet Take 80 mg by mouth at bedtime.   Yes [provider]  PROAIR HFA 108 [phone] Base) MCG/ACT inhaler  INHALE TWO PUFFS INTO THE LUNGS EVERY 6 HOURS AS NEEDED FOR WHEEZING OR SHORTNESS OF BREATH 12/30/16  Yes [provider]  rizatriptan (MAXALT-MLT) 10 MG disintegrating tablet Take 1 tablet (10 mg total) as needed by mouth. May repeat in 2 hours if needed Patient taking differently: Take 10 mg by mouth as needed for migraine. May repeat in 2 hours if needed 04/20/17  Yes Marcial Pacas, MD  sodium chloride (OCEAN) 0.65 % SOLN nasal spray Place 2 sprays into both nostrils as needed for congestion.   Yes [provider]  traMADol (ULTRAM) 50 MG tablet Take 50 mg by mouth 2 (two) times daily as needed for moderate pain.    Yes [provider]  lubiprostone (AMITIZA) 8 MCG capsule Take 8 mcg by mouth 2 (two) times a week.     [provider]    Family History Family History  Problem Relation Age of Onset  . Asthma Mother   . Bipolar disorder Mother   . Heart disease Father   . Lymphoma Father   . Hypertension Father   . Thyroid disease Father   . Hyperlipidemia Father   . Diabetes Father   . Cancer Paternal Grandmother        lung and breast  . Bladder Cancer Paternal Grandfather   . Suicidality Maternal Grandfather   . Thyroid disease Brother     Social History Social History   Tobacco Use  . Smoking status: Former Smoker    Packs/day: 0.75    Years: 20.00    Pack years: 15.00    Types: Cigarettes    Last attempt to quit: 06/07/2012    Years since quitting: 5.7  . Smokeless tobacco: Never Used  Substance Use Topics  . Alcohol use: No  . Drug use: No     Allergies   Ciprofloxacin; Levaquin [levofloxacin]; Buspar [buspirone]; Linaclotide; Advair diskus [fluticasone-salmeterol]; Biaxin  [clarithromycin]; and Hydroxyzine   Review of Systems Review of Systems  Constitutional: Positive for appetite change. Negative for fever.  HENT: Negative for congestion.   Respiratory: Negative for shortness of breath.   Cardiovascular: Negative for chest pain.  Gastrointestinal: Positive for nausea and vomiting.  Endocrine: Positive for polyuria.  Genitourinary: Negative for frequency.  Musculoskeletal: Negative for back pain.  Skin: Negative for rash and wound.  Psychiatric/Behavioral: Negative for confusion.     Physical Exam Updated Vital Signs BP 137/70   Pulse 72   Temp 97.7 F (36.5 C) (Oral)   Resp 18   Wt (!) 139.7 kg   SpO2 95%   BMI 45.48 kg/m   Physical Exam  Constitutional: She appears well-developed.  HENT:  Head: Atraumatic.  Eyes: Pupils are equal, round, and reactive to light.  Cardiovascular: Normal rate.  Pulmonary/Chest: Effort normal.  Abdominal: There is no tenderness.  Musculoskeletal: She exhibits no edema.  Neurological: She is alert.  Skin: Skin is warm. Capillary refill takes less than 2 seconds.  Psychiatric: She has a normal mood and affect.     ED Treatments / Results  Labs (all labs ordered are listed, but only abnormal results are displayed) Labs Reviewed  BLOOD GAS, VENOUS - Abnormal; Notable for the following components:      Result Value   pO2, Ven 47.5 (*)    All other components within normal limits  COMPREHENSIVE METABOLIC PANEL - Abnormal; Notable for the following components:   Sodium 128 (*)    Chloride 90 (*)    Glucose, Bld 747 (*)  BUN 33 (*)    Creatinine, Ser 2.00 (*)    Calcium 8.8 (*)    GFR calc non Af Amer 29 (*)    GFR calc Af Amer 33 (*)    All other components within normal limits  URINALYSIS, ROUTINE W REFLEX MICROSCOPIC - Abnormal; Notable for the following components:   Color, Urine STRAW (*)    Glucose, UA >=500 (*)    Ketones, ur 5 (*)    Bacteria, UA RARE (*)    All other components  within normal limits  CBG MONITORING, ED - Abnormal; Notable for the following components:   Glucose-Capillary 437 (*)    All other components within normal limits  CBG MONITORING, ED - Abnormal; Notable for the following components:   Glucose-Capillary 314 (*)    All other components within normal limits  CBC WITH DIFFERENTIAL/PLATELET    EKG None  Radiology No results found.  Procedures Procedures (including critical care time)  Medications Ordered in ED Medications  sodium chloride 0.9 % bolus 1,000 mL (0 mLs Intravenous Stopped 03/01/18 1115)  insulin aspart (novoLOG) injection 10 Units (10 Units Intravenous Given 03/01/18 1102)  sodium chloride 0.9 % bolus 1,000 mL (0 mLs Intravenous Stopped 03/01/18 1217)  insulin aspart (novoLOG) injection 8 Units (8 Units Intravenous Given 03/01/18 1251)     Initial Impression / Assessment and Plan / ED Course  I have reviewed the triage vital signs and the nursing notes.  Pertinent labs & imaging results that were available during my care of the patient were reviewed by me and considered in my medical decision making (see chart for details).     Patient with hyperglycemia.  Likely to to malfunction of her insulin pump.  Sugar improved.  Not in DKA.  Will discharge home.  Final Clinical Impressions(s) / ED Diagnoses   Final diagnoses:  Hyperglycemia    ED Discharge Orders    None       Davonna Belling, MD 03/01/18 1505

## 2018-03-01 NOTE — ED Triage Notes (Signed)
>  600 cbg reading at home. Nausea for past two days. Weakness noted. Type 1 diabetic.

## 2018-03-01 NOTE — ED Notes (Signed)
CRITICAL VALUE ALERT  Critical Value:  Glucose 747  Date & Time Notied:  03/01/18, 1019  Provider Notified: Dr. Alvino Chapel  Orders Received/Actions taken: no new orders at this time

## 2018-03-07 ENCOUNTER — Telehealth (HOSPITAL_COMMUNITY): Payer: Self-pay | Admitting: *Deleted

## 2018-03-07 NOTE — Telephone Encounter (Signed)
Dr Modesta Messing Provider Dr Clarise Cruz Guadlupe Spanish Called corned about Jasmine [name] during her visit today. Patient expressed not suicidal thoughts or harm to self. But expressed that it would be okay if she was removed from this life. Provider concerned that her appointment with you on 03/16/18 may need to be sooner. I called patient to try to get her in tomorrow 03/08/18 A.M. Appointments available. And she has other doctor appointments scheduled in [place]. And your schedule is otherwise booked. I asked patient if she may harm herself any thoughts of harming herself- she stated no not  @ this time. I informed her to go to the Emergency room if anything changes  & offered the suicidal hotline  #. Please call patient for follow up

## 2018-03-07 NOTE — Telephone Encounter (Signed)
Discussed with the patient. She had SI without plan last week, stating that she wished not to be there. She denies SI today. Although she is unable to come for sooner appointment, she is able to come to appointment on 10/10. Emergency resources are discussed.
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
## 2018-03-16 ENCOUNTER — Encounter (HOSPITAL_COMMUNITY): Payer: Self-pay | Admitting: Psychiatry

## 2018-03-16 ENCOUNTER — Ambulatory Visit (INDEPENDENT_AMBULATORY_CARE_PROVIDER_SITE_OTHER): Payer: BLUE CROSS/BLUE SHIELD | Admitting: Psychiatry

## 2018-03-16 VITALS — BP 126/79 | HR 64 | Ht 69.0 in | Wt 302.0 lb

## 2018-03-16 DIAGNOSIS — F3181 Bipolar II disorder: Secondary | ICD-10-CM

## 2018-03-16 MED ORDER — BREXPIPRAZOLE 0.5 MG PO TABS: 0.5000 mg | ORAL_TABLET | Freq: Every day | ORAL | 0 refills | Status: DC

## 2018-03-16 MED ORDER — DIAZEPAM 5 MG PO TABS: 5.0000 mg | ORAL_TABLET | Freq: Every day | ORAL | 0 refills | Status: DC | PRN

## 2018-03-16 NOTE — Patient Instructions (Signed)
1. Continue lamotrigine 200 mg daily  2.Continue duloxetine 60 mg twice a day  3.ContinueWellbutrin 300 mg daily 4. Start Rexulti 0.5 mg daily  5. Return to clinic in one month for 30 mins

## 2018-03-22 ENCOUNTER — Ambulatory Visit (HOSPITAL_COMMUNITY)
Admission: RE | Admit: 2018-03-22 | Discharge: 2018-03-22 | Disposition: A | Discharge status: Home / Self Care | Payer: BLUE CROSS/BLUE SHIELD | Source: Ambulatory Visit | Attending: Internal Medicine | Admitting: Internal Medicine

## 2018-03-22 DIAGNOSIS — R911 Solitary pulmonary nodule: Secondary | ICD-10-CM | POA: Diagnosis present

## 2018-03-22 DIAGNOSIS — R918 Other nonspecific abnormal finding of lung field: Secondary | ICD-10-CM | POA: Diagnosis not present

## 2018-04-04 ENCOUNTER — Ambulatory Visit: Payer: Self-pay | Admitting: Internal Medicine

## 2018-04-06 ENCOUNTER — Encounter (HOSPITAL_COMMUNITY): Payer: Self-pay | Admitting: Emergency Medicine

## 2018-04-06 ENCOUNTER — Emergency Department (HOSPITAL_COMMUNITY)
Admission: EM | Admit: 2018-04-06 | Discharge: 2018-04-06 | Disposition: A | Discharge status: Home / Self Care | Payer: BLUE CROSS/BLUE SHIELD | Attending: Emergency Medicine | Admitting: Emergency Medicine

## 2018-04-06 ENCOUNTER — Other Ambulatory Visit: Payer: Self-pay

## 2018-04-06 ENCOUNTER — Emergency Department (HOSPITAL_COMMUNITY): Payer: BLUE CROSS/BLUE SHIELD

## 2018-04-06 DIAGNOSIS — E1122 Type 2 diabetes mellitus with diabetic chronic kidney disease: Secondary | ICD-10-CM | POA: Insufficient documentation

## 2018-04-06 DIAGNOSIS — Z79899 Other long term (current) drug therapy: Secondary | ICD-10-CM | POA: Diagnosis not present

## 2018-04-06 DIAGNOSIS — E039 Hypothyroidism, unspecified: Secondary | ICD-10-CM | POA: Diagnosis not present

## 2018-04-06 DIAGNOSIS — N183 Chronic kidney disease, stage 3 (moderate): Secondary | ICD-10-CM | POA: Diagnosis not present

## 2018-04-06 DIAGNOSIS — F319 Bipolar disorder, unspecified: Secondary | ICD-10-CM | POA: Diagnosis not present

## 2018-04-06 DIAGNOSIS — M722 Plantar fascial fibromatosis: Secondary | ICD-10-CM | POA: Insufficient documentation

## 2018-04-06 DIAGNOSIS — M79671 Pain in right foot: Secondary | ICD-10-CM | POA: Diagnosis present

## 2018-04-06 DIAGNOSIS — Z7982 Long term (current) use of aspirin: Secondary | ICD-10-CM | POA: Diagnosis not present

## 2018-04-06 DIAGNOSIS — Z87891 Personal history of nicotine dependence: Secondary | ICD-10-CM | POA: Diagnosis not present

## 2018-04-06 DIAGNOSIS — I129 Hypertensive chronic kidney disease with stage 1 through stage 4 chronic kidney disease, or unspecified chronic kidney disease: Secondary | ICD-10-CM | POA: Diagnosis not present

## 2018-04-06 DIAGNOSIS — F419 Anxiety disorder, unspecified: Secondary | ICD-10-CM | POA: Insufficient documentation

## 2018-04-06 DIAGNOSIS — Z794 Long term (current) use of insulin: Secondary | ICD-10-CM | POA: Insufficient documentation

## 2018-04-06 NOTE — Discharge Instructions (Signed)
Wear supportive shoes and you may also want to try a supportive inset in your shoe.  Roll your foot over a cold can or bottle amy also help.  Call the foot doctor (podiatrist) listed to arrange a follow-up appt if needed.

## 2018-04-06 NOTE — ED Triage Notes (Signed)
Pt C/O right foot pain that radiates to her ankle. Pt states this has been going on for 3 days. Pt denies any injury to the area.

## 2018-04-06 NOTE — ED Provider Notes (Signed)
[facility] EMERGENCY DEPARTMENT Provider Note   CSN: [id number] Arrival date & time: 04/06/18  2051     History   Chief Complaint Chief Complaint  Patient presents with  . Foot Pain    HPI Jasmine [name] is a 47 y.o. female.  HPI   Jasmine [name] is a 47 y.o. female who presents to the Emergency Department complaining of persistent, worsening pain to his mid right foot.  Symptoms present for 3 days.  Symptoms gradual in onset.  She has been elevating her foot at home without relief.  She describes the pain as constant, but worsens with any weight bearing and pain radiates up the top of her foot.  No known injury, new shoes, excessive walking or standing.  She denies redness, swelling, numbness of the foot or symptoms proximal to the ankle.     Past Medical History:  Diagnosis Date  . Anxiety   . Bipolar disorder ([place])   . Chronic fatigue   . CKD (chronic kidney disease), stage II   . Depression   . Diabetes mellitus   . DKA, type 1 ([place]) 11/04/2011  . Elevated cholesterol   . Fibromyalgia   . GERD (gastroesophageal reflux disease)   . Headache   . Hyperlipemia   . Hypertension   . Hypothyroidism   . IBS (irritable bowel syndrome)   . Obesity   . Sleep apnea    HAS C -PAP / DOES NOT USE  . Stress incontinence    Pt had surgery to correct this.  . Tachycardia   . Tobacco abuse   . UTI (lower urinary tract infection)     Patient Active Problem List   Diagnosis Date Noted  . Chronic migraine 01/17/2017  . Diabetic peripheral neuropathy ([place]) 01/17/2017  . OSA (obstructive sleep apnea) 07/27/2016  . Wheezing 07/27/2016  . Hyperglycemia 12/25/2012  . Acute on chronic renal failure ([place]) 12/25/2012  . Pulmonary infiltrates 10/02/2012  . Postnasal drip 10/02/2012  . Hyperkalemia 09/25/2012  . Chronic kidney disease, stage III (moderate) ([place]) 09/25/2012  . Morbid obesity ([place]) 09/24/2012  . HTN (hypertension) 09/24/2012  . Bipolar II disorder ([place])  09/24/2012  . URI (upper respiratory infection) 09/24/2012  . DKA, type 1 ([place]) 11/04/2011  . Gastroenteritis 11/03/2011  . DM (diabetes mellitus), type 1, uncontrolled ([place]) 11/03/2011  . Hyponatremia 11/03/2011  . CKD (chronic kidney disease), stage II 11/03/2011  . Elevated lipase 11/03/2011  . Hypothyroidism 11/03/2011  . Tobacco abuse 11/03/2011  . SUI (stress urinary incontinence, female) 08/16/2011    Past Surgical History:  Procedure Laterality Date  . INCONTINENCE SURGERY    . NASAL FRACTURE SURGERY    . ovary removed    . OVARY SURGERY    . PUBOVAGINAL SLING  08/16/2011   Procedure: [name];  Surgeon: Bernestine Amass, MD;  Location: [facility];  Service: Urology;  Laterality: N/A;         . UTERINE FIBROID SURGERY  2001     OB History    Gravida  1   Para      Term      Preterm      AB      Living        SAB      TAB      Ectopic      Multiple      Live Births               Home Medications  Prior to Admission medications   Medication Sig Start Date End Date Taking? Authorizing Provider  acetaminophen (TYLENOL) 500 MG tablet Take 1,000 mg by mouth every 6 (six) hours as needed for moderate pain or headache.     [provider]  aspirin EC 81 MG tablet Take 81 mg by mouth at bedtime.     [provider]  Biotin 1 MG CAPS Take 1 capsule by mouth daily.    [provider]  Brexpiprazole (REXULTI) 0.5 MG TABS Take 1 tablet (0.5 mg total) by mouth daily. 03/16/18   Norman Clay, MD  buPROPion (WELLBUTRIN XL) 300 MG 24 hr tablet Take 1 tablet (300 mg total) by mouth daily. 02/14/18   Norman Clay, MD  carvedilol (COREG) 25 MG tablet Take 37.5 mg by mouth 2 (two) times daily with a meal.     [provider]  cholecalciferol (VITAMIN D) 1000 UNITS tablet Take 1,000 Units by mouth daily.    [provider]  Coenzyme Q10 (CO Q 10 PO) Take 50 mg by mouth every evening.     [provider]  diazepam (VALIUM) 5 MG tablet Take 1 tablet (5 mg total) by mouth daily as needed for anxiety or muscle spasms. 03/16/18   Norman Clay, MD  Diclofenac Sodium (PENNSAID) 2 % SOLN Place 1 application onto the skin daily as needed (for back pain).    [provider]  dicyclomine (BENTYL) 10 MG capsule Take 10 mg by mouth 3 (three) times daily as needed for spasms.    [provider]  DULoxetine (CYMBALTA) 60 MG capsule Take 1 capsule (60 mg total) by mouth 2 (two) times daily. 02/14/18   Norman Clay, MD  fluticasone (FLONASE) 50 MCG/ACT nasal spray Place 2 sprays into the nose daily as needed for allergies.     [provider]  folic acid (FOLVITE) 607 MCG tablet Take 400 mcg by mouth every evening.     [provider]  furosemide (LASIX) 40 MG tablet Take 40 mg by mouth daily.     [provider]  gabapentin (NEURONTIN) 300 MG capsule Take 300 mg by mouth at bedtime.     [provider]  lamoTRIgine (LAMICTAL) 200 MG tablet Take 1 tablet (200 mg total) by mouth daily. 02/14/18   Norman Clay, MD  levothyroxine (SYNTHROID, LEVOTHROID) 150 MCG tablet Take 150 mcg by mouth daily before breakfast.    [provider]  lubiprostone (AMITIZA) 8 MCG capsule Take 8 mcg by mouth 2 (two) times a week.     [provider]  Naphazoline HCl (CLEAR EYES OP) Place 2 drops into both eyes 3 (three) times daily as needed (for dry eyes).    [provider]  NOVOLOG 100 UNIT/ML injection USE AS DIRECTED FOR INSULIN PUMP MAX DAILY DOSE 120 UNITS PER DAY 02/24/17   [provider]  Omega-3 Fatty Acids (FISH OIL PO) Take 1 capsule by mouth daily.    [provider]  omeprazole (PRILOSEC) 20 MG capsule Take 20 mg by mouth at bedtime.     [provider]  ondansetron (ZOFRAN ODT) 4 MG disintegrating tablet Take 1 tablet (4 mg total) by mouth every 8 (eight) hours as needed. Patient taking differently: Take 4 mg by  mouth every 8 (eight) hours as needed for nausea or vomiting.  01/17/17   Marcial Pacas, MD  oxyCODONE-acetaminophen (PERCOCET/ROXICET) 5-325 MG tablet Take 2 tablets every 4 (four) hours as needed by mouth for  severe pain. 04/15/17   Fransico Meadow, PA-C  pravastatin (PRAVACHOL) 80 MG tablet Take 80 mg by mouth at bedtime.    [provider]  PROAIR HFA 108 (90 Base) MCG/ACT inhaler INHALE TWO PUFFS INTO THE LUNGS EVERY 6 HOURS AS NEEDED FOR WHEEZING OR SHORTNESS OF BREATH 12/30/16   [provider]  rizatriptan (MAXALT-MLT) 10 MG disintegrating tablet Take 1 tablet (10 mg total) as needed by mouth. May repeat in 2 hours if needed Patient taking differently: Take 10 mg by mouth as needed for migraine. May repeat in 2 hours if needed 04/20/17   Marcial Pacas, MD  sodium chloride (OCEAN) 0.65 % SOLN nasal spray Place 2 sprays into both nostrils as needed for congestion.    [provider]  traMADol (ULTRAM) 50 MG tablet Take 50 mg by mouth 2 (two) times daily as needed for moderate pain.     [provider]    Family History Family History  Problem Relation Age of Onset  . Asthma Mother   . Bipolar disorder Mother   . Heart disease Father   . Lymphoma Father   . Hypertension Father   . Thyroid disease Father   . Hyperlipidemia Father   . Diabetes Father   . Cancer Paternal Grandmother        lung and breast  . Bladder Cancer Paternal Grandfather   . Suicidality Maternal Grandfather   . Thyroid disease Brother     Social History Social History   Tobacco Use  . Smoking status: Former Smoker    Packs/day: 0.75    Years: 20.00    Pack years: 15.00    Types: Cigarettes    Last attempt to quit: 06/07/2012    Years since quitting: 5.8  . Smokeless tobacco: Never Used  Substance Use Topics  . Alcohol use: No  . Drug use: No     Allergies   Ciprofloxacin; Levaquin [levofloxacin]; Buspar [buspirone]; Linaclotide; Advair diskus [fluticasone-salmeterol];  Biaxin [clarithromycin]; and Hydroxyzine   Review of Systems Review of Systems  Constitutional: Negative for chills and fever.  Cardiovascular: Negative for leg swelling.  Musculoskeletal: Positive for arthralgias (right foot pain). Negative for back pain and joint swelling.  Skin: Negative for color change and wound.  Neurological: Negative for weakness and numbness.     Physical Exam Updated Vital Signs BP (!) 141/77 (BP Location: Right Arm)   Pulse 77   Temp 98 F (36.7 C) (Oral)   Resp 16   Ht 5\' 9"  (1.753 m)   Wt 135.2 kg   SpO2 96%   BMI 44.01 kg/m   Physical Exam  Constitutional: She appears well-developed and well-nourished. No distress.  obese  HENT:  Head: Atraumatic.  Mouth/Throat: Oropharynx is clear and moist.  Cardiovascular: Normal rate, regular rhythm and intact distal pulses.  Pulmonary/Chest: Effort normal and breath sounds normal. No respiratory distress.  Musculoskeletal: She exhibits tenderness. She exhibits no edema or deformity.  ttp of the tibial aspect of the dorsal right foot.  No erythema, excessive warmth or edema.  Ankle non-tender.  Toes are nml appearing.    Neurological: She is alert. No sensory deficit.  Skin: Skin is warm. Capillary refill takes less than 2 seconds. No rash noted. No erythema.  Nursing note and vitals reviewed.    ED Treatments / Results  Labs (all labs ordered are listed, but only abnormal results are displayed) Labs Reviewed - No data to display  EKG None  Radiology Dg Foot  Complete Right  Result Date: 04/06/2018 CLINICAL DATA:  Right foot pain across metatarsals 3 days. No injury. EXAM: RIGHT FOOT COMPLETE - 3+ VIEW COMPARISON:  None. FINDINGS: No evidence of acute fracture or dislocation. Mild degenerate changes over the midfoot. 2.4 cm linear calcification along the plantar fascia 3 cm anterior to the calcaneal attachment. IMPRESSION: No acute findings. Mild degenerate changes of the midfoot. Nonspecific  calcification along the plantar fascia. Electronically Signed   By: Marin Olp M.D.   On: 04/06/2018 21:25    Procedures Procedures (including critical care time)  Medications Ordered in ED Medications - No data to display   Initial Impression / Assessment and Plan / ED Course  I have reviewed the triage vital signs and the nursing notes.  Pertinent labs & imaging results that were available during my care of the patient were reviewed by me and considered in my medical decision making (see chart for details).     Pt with right foot pain, no hx of injury.  No concerning sx's for infectious process, no calf pain or edema.  NV intact.  Likely plantar fasciitis.    Post op shoe applied, pt agrees to symptomatic tx, supportive shoes and orthotic.  referral provided for podiatry. Has pain medication at home.    Final Clinical Impressions(s) / ED Diagnoses   Final diagnoses:  Plantar fasciitis of right foot    ED Discharge Orders    None       Bufford Lope 04/06/18 2151    Noemi Chapel, MD 04/07/18 1228

## 2018-04-09 ENCOUNTER — Other Ambulatory Visit: Payer: Self-pay

## 2018-04-09 ENCOUNTER — Encounter (HOSPITAL_COMMUNITY): Payer: Self-pay | Admitting: Emergency Medicine

## 2018-04-09 ENCOUNTER — Emergency Department (HOSPITAL_COMMUNITY)
Admission: EM | Admit: 2018-04-09 | Discharge: 2018-04-09 | Disposition: A | Discharge status: Home / Self Care | Payer: BLUE CROSS/BLUE SHIELD | Attending: Emergency Medicine | Admitting: Emergency Medicine

## 2018-04-09 DIAGNOSIS — E039 Hypothyroidism, unspecified: Secondary | ICD-10-CM | POA: Diagnosis not present

## 2018-04-09 DIAGNOSIS — R739 Hyperglycemia, unspecified: Secondary | ICD-10-CM | POA: Diagnosis present

## 2018-04-09 DIAGNOSIS — E1065 Type 1 diabetes mellitus with hyperglycemia: Secondary | ICD-10-CM | POA: Diagnosis not present

## 2018-04-09 DIAGNOSIS — E1022 Type 1 diabetes mellitus with diabetic chronic kidney disease: Secondary | ICD-10-CM | POA: Insufficient documentation

## 2018-04-09 DIAGNOSIS — F319 Bipolar disorder, unspecified: Secondary | ICD-10-CM | POA: Insufficient documentation

## 2018-04-09 DIAGNOSIS — Z87891 Personal history of nicotine dependence: Secondary | ICD-10-CM | POA: Insufficient documentation

## 2018-04-09 DIAGNOSIS — F419 Anxiety disorder, unspecified: Secondary | ICD-10-CM | POA: Insufficient documentation

## 2018-04-09 DIAGNOSIS — N183 Chronic kidney disease, stage 3 (moderate): Secondary | ICD-10-CM | POA: Insufficient documentation

## 2018-04-09 DIAGNOSIS — Z794 Long term (current) use of insulin: Secondary | ICD-10-CM | POA: Diagnosis not present

## 2018-04-09 DIAGNOSIS — Z79899 Other long term (current) drug therapy: Secondary | ICD-10-CM | POA: Diagnosis not present

## 2018-04-09 DIAGNOSIS — I129 Hypertensive chronic kidney disease with stage 1 through stage 4 chronic kidney disease, or unspecified chronic kidney disease: Secondary | ICD-10-CM | POA: Insufficient documentation

## 2018-04-09 DIAGNOSIS — Z7982 Long term (current) use of aspirin: Secondary | ICD-10-CM | POA: Diagnosis not present

## 2018-04-09 LAB — CBC
HCT: 43.2 % (ref 36.0–46.0)
Hemoglobin: 13.1 g/dL (ref 12.0–15.0)
MCH: 28.1 pg (ref 26.0–34.0)
MCHC: 30.3 g/dL (ref 30.0–36.0)
MCV: 92.7 fL (ref 80.0–100.0)
Platelets: 248 10*3/uL (ref 150–400)
RBC: 4.66 MIL/uL (ref 3.87–5.11)
RDW: 13.6 % (ref 11.5–15.5)
WBC: 8.5 10*3/uL (ref 4.0–10.5)
nRBC: 0 % (ref 0.0–0.2)

## 2018-04-09 LAB — BASIC METABOLIC PANEL
Anion gap: 9 (ref 5–15)
BUN: 34 mg/dL — AB (ref 6–20)
CO2: 25 mmol/L (ref 22–32)
CREATININE: 2.03 mg/dL — AB (ref 0.44–1.00)
Calcium: 8.8 mg/dL — ABNORMAL LOW (ref 8.9–10.3)
Chloride: 96 mmol/L — ABNORMAL LOW (ref 98–111)
GFR calc Af Amer: 33 mL/min — ABNORMAL LOW (ref >60)
GFR, EST NON AFRICAN AMERICAN: 28 mL/min — AB (ref >60)
Glucose, Bld: 613 mg/dL (ref 70–99)
Potassium: 5.6 mmol/L — ABNORMAL HIGH (ref 3.5–5.1)
SODIUM: 130 mmol/L — AB (ref 135–145)

## 2018-04-09 LAB — URINALYSIS, ROUTINE W REFLEX MICROSCOPIC
Bilirubin Urine: NEGATIVE
Glucose, UA: >=500 mg/dL — AB
Hgb urine dipstick: NEGATIVE
KETONES UR: 5 mg/dL — AB
LEUKOCYTES UA: NEGATIVE
Nitrite: NEGATIVE
Protein, ur: NEGATIVE mg/dL
Specific Gravity, Urine: 1.011 (ref 1.005–1.030)
pH: 6 (ref 5.0–8.0)

## 2018-04-09 LAB — CBG MONITORING, ED
GLUCOSE-CAPILLARY: 572 mg/dL — AB (ref 70–99)
GLUCOSE-CAPILLARY: 512 mg/dL — AB (ref 70–99)
GLUCOSE-CAPILLARY: 346 mg/dL — AB (ref 70–99)
Glucose-Capillary: 521 mg/dL (ref 70–99)

## 2018-04-09 LAB — BLOOD GAS, VENOUS
ACID-BASE EXCESS: 0.3 mmol/L (ref 0.0–2.0)
BICARBONATE: 23.3 mmol/L (ref 20.0–28.0)
FIO2: 21
O2 SAT: 72.6 %
PH VEN: 7.313 (ref 7.250–7.430)
Patient temperature: 37
pCO2, Ven: 52.4 mmHg (ref 44.0–60.0)
pO2, Ven: 43.3 mmHg (ref 32.0–45.0)

## 2018-04-09 LAB — POTASSIUM: POTASSIUM: 5.1 mmol/L (ref 3.5–5.1)

## 2018-04-09 MED ORDER — ONDANSETRON HCL 4 MG/2ML IJ SOLN
4.0000 mg | Freq: Once | INTRAMUSCULAR | Status: AC
  Administered 2018-04-09: 4 mg via INTRAVENOUS
  Filled 2018-04-09: qty 2

## 2018-04-09 MED ORDER — SODIUM CHLORIDE 0.9 % IV BOLUS
1000.0000 mL | Freq: Once | INTRAVENOUS | Status: AC
  Administered 2018-04-09: 1000 mL via INTRAVENOUS

## 2018-04-09 MED ORDER — INSULIN ASPART 100 UNIT/ML ~~LOC~~ SOLN
8.0000 [IU] | Freq: Once | SUBCUTANEOUS | Status: AC
  Administered 2018-04-09: 8 [IU] via SUBCUTANEOUS
  Filled 2018-04-09: qty 1

## 2018-04-09 MED ORDER — INSULIN ASPART 100 UNIT/ML ~~LOC~~ SOLN
10.0000 [IU] | Freq: Once | SUBCUTANEOUS | Status: AC
  Administered 2018-04-09: 10 [IU] via SUBCUTANEOUS
  Filled 2018-04-09: qty 1

## 2018-04-09 NOTE — ED Notes (Addendum)
CRITICAL VALUE ALERT  Critical Value:  Glucose 613  Date & Time Notied:  04/09/18 4884  Provider Notified: Dr. Chauncey Cruel. [place]  Orders Received/Actions taken: n/a

## 2018-04-09 NOTE — ED Provider Notes (Signed)
Patient is glucose improved with fluids and insulin.  She is not in DKA.  She will be discharged home and will continue hydrating herself and giving herself additional insulin if needed.  She will follow-up with her PCP this week   Milton Ferguson, MD 04/09/18 [phone]

## 2018-04-09 NOTE — Discharge Instructions (Addendum)
Keep yourself hydrated and take your medications as prescribed. Followup with your doctor this week

## 2018-04-09 NOTE — ED Triage Notes (Signed)
Pt states that her blood-sugar has been greater than 400 "all night".

## 2018-04-09 NOTE — ED Notes (Signed)
I-Stat Lactic Acid: 1.11

## 2018-04-09 NOTE — ED Provider Notes (Signed)
[facility] EMERGENCY DEPARTMENT Provider Note   CSN: [id number] Arrival date & time: 04/09/18  0510     History   Chief Complaint Chief Complaint  Patient presents with  . Hyperglycemia    HPI Jasmine [name] is a 47 y.o. female.  Type I diabetic with insulin pump in place presenting with elevated blood sugar since last night.  Reports her sugars been in the 300-400 range and she is giving herself multiple boluses without effect.  She does not know why her blood sugar is high states no dietary indiscretions and states her pump is functioning appropriately.  Has had some nausea but no vomiting.  Has chronic diarrhea from IBS which is unchanged.  No abdominal pain, fever, chills.  No pain with urination or blood in the urine.  No dizziness or lightheadedness.  Denies any recent infectious symptoms.  States compliance with her insulin regimen.  The history is provided by the patient.  Hyperglycemia  Associated symptoms: nausea   Associated symptoms: no abdominal pain, no chest pain, no dizziness, no dysuria, no fatigue, no shortness of breath, no vomiting and no weakness     Past Medical History:  Diagnosis Date  . Anxiety   . Bipolar disorder ([place])   . Chronic fatigue   . CKD (chronic kidney disease), stage II   . Depression   . Diabetes mellitus   . DKA, type 1 ([place]) 11/04/2011  . Elevated cholesterol   . Fibromyalgia   . GERD (gastroesophageal reflux disease)   . Headache   . Hyperlipemia   . Hypertension   . Hypothyroidism   . IBS (irritable bowel syndrome)   . Obesity   . Sleep apnea    HAS C -PAP / DOES NOT USE  . Stress incontinence    Pt had surgery to correct this.  . Tachycardia   . Tobacco abuse   . UTI (lower urinary tract infection)     Patient Active Problem List   Diagnosis Date Noted  . Chronic migraine 01/17/2017  . Diabetic peripheral neuropathy ([place]) 01/17/2017  . OSA (obstructive sleep apnea) 07/27/2016  . Wheezing 07/27/2016  .  Hyperglycemia 12/25/2012  . Acute on chronic renal failure ([place]) 12/25/2012  . Pulmonary infiltrates 10/02/2012  . Postnasal drip 10/02/2012  . Hyperkalemia 09/25/2012  . Chronic kidney disease, stage III (moderate) ([place]) 09/25/2012  . Morbid obesity ([place]) 09/24/2012  . HTN (hypertension) 09/24/2012  . Bipolar II disorder ([place]) 09/24/2012  . URI (upper respiratory infection) 09/24/2012  . DKA, type 1 ([place]) 11/04/2011  . Gastroenteritis 11/03/2011  . DM (diabetes mellitus), type 1, uncontrolled ([place]) 11/03/2011  . Hyponatremia 11/03/2011  . CKD (chronic kidney disease), stage II 11/03/2011  . Elevated lipase 11/03/2011  . Hypothyroidism 11/03/2011  . Tobacco abuse 11/03/2011  . SUI (stress urinary incontinence, female) 08/16/2011    Past Surgical History:  Procedure Laterality Date  . INCONTINENCE SURGERY    . NASAL FRACTURE SURGERY    . ovary removed    . OVARY SURGERY    . PUBOVAGINAL SLING  08/16/2011   Procedure: [name];  Surgeon: Bernestine Amass, MD;  Location: [facility];  Service: Urology;  Laterality: N/A;         . UTERINE FIBROID SURGERY  2001     OB History    Gravida  1   Para      Term      Preterm      AB      Living  SAB      TAB      Ectopic      Multiple      Live Births               Home Medications    Prior to Admission medications   Medication Sig Start Date End Date Taking? Authorizing Provider  acetaminophen (TYLENOL) 500 MG tablet Take 1,000 mg by mouth every 6 (six) hours as needed for moderate pain or headache.    Yes [provider]  aspirin EC 81 MG tablet Take 81 mg by mouth at bedtime.    Yes [provider]  Biotin 1 MG CAPS Take 1 capsule by mouth daily.   Yes [provider]  Brexpiprazole (REXULTI) 0.5 MG TABS Take 1 tablet (0.5 mg total) by mouth daily. 03/16/18  Yes Hisada, Elie Goody, MD  buPROPion (WELLBUTRIN XL) 300 MG 24 hr tablet Take 1 tablet (300 mg total) by mouth  daily. 02/14/18  Yes Hisada, Elie Goody, MD  carvedilol (COREG) 25 MG tablet Take 37.5 mg by mouth 2 (two) times daily with a meal.    Yes [provider]  cholecalciferol (VITAMIN D) 1000 UNITS tablet Take 1,000 Units by mouth daily.   Yes [provider]  Coenzyme Q10 (CO Q 10 PO) Take 50 mg by mouth every evening.    Yes [provider]  diazepam (VALIUM) 5 MG tablet Take 1 tablet (5 mg total) by mouth daily as needed for anxiety or muscle spasms. 03/16/18  Yes Hisada, Elie Goody, MD  Diclofenac Sodium (PENNSAID) 2 % SOLN Place 1 application onto the skin daily as needed (for back pain).   Yes [provider]  dicyclomine (BENTYL) 10 MG capsule Take 10 mg by mouth 3 (three) times daily as needed for spasms.   Yes [provider]  DULoxetine (CYMBALTA) 60 MG capsule Take 1 capsule (60 mg total) by mouth 2 (two) times daily. 02/14/18  Yes Hisada, Elie Goody, MD  fluticasone (FLONASE) 50 MCG/ACT nasal spray Place 2 sprays into the nose daily as needed for allergies.    Yes [provider]  folic acid (FOLVITE) 656 MCG tablet Take 400 mcg by mouth every evening.    Yes [provider]  furosemide (LASIX) 40 MG tablet Take 40 mg by mouth daily.    Yes [provider]  gabapentin (NEURONTIN) 300 MG capsule Take 300 mg by mouth at bedtime.    Yes [provider]  lamoTRIgine (LAMICTAL) 200 MG tablet Take 1 tablet (200 mg total) by mouth daily. 02/14/18  Yes Hisada, Elie Goody, MD  levothyroxine (SYNTHROID, LEVOTHROID) 150 MCG tablet Take 150 mcg by mouth daily before breakfast.   Yes [provider]  lubiprostone (AMITIZA) 8 MCG capsule Take 8 mcg by mouth 2 (two) times a week.    Yes [provider]  Naphazoline HCl (CLEAR EYES OP) Place 2 drops into both eyes 3 (three) times daily as needed (for dry eyes).   Yes [provider]  NOVOLOG 100 UNIT/ML injection USE AS DIRECTED FOR INSULIN PUMP MAX DAILY DOSE 120 UNITS PER  DAY 02/24/17  Yes [provider]  Omega-3 Fatty Acids (FISH OIL PO) Take 1 capsule by mouth daily.   Yes [provider]  omeprazole (PRILOSEC) 20 MG capsule Take 20 mg by mouth at bedtime.    Yes [provider]  ondansetron (ZOFRAN ODT) 4 MG disintegrating tablet Take 1 tablet (4 mg total) by mouth every 8 (eight) hours  as needed. Patient taking differently: Take 4 mg by mouth every 8 (eight) hours as needed for nausea or vomiting.  01/17/17  Yes Marcial Pacas, MD  oxyCODONE-acetaminophen (PERCOCET/ROXICET) 5-325 MG tablet Take 2 tablets every 4 (four) hours as needed by mouth for severe pain. 04/15/17  Yes Caryl Ada K, PA-C  pravastatin (PRAVACHOL) 80 MG tablet Take 80 mg by mouth at bedtime.   Yes [provider]  PROAIR HFA 108 (90 Base) MCG/ACT inhaler INHALE TWO PUFFS INTO THE LUNGS EVERY 6 HOURS AS NEEDED FOR WHEEZING OR SHORTNESS OF BREATH 12/30/16  Yes [provider]  rizatriptan (MAXALT-MLT) 10 MG disintegrating tablet Take 1 tablet (10 mg total) as needed by mouth. May repeat in 2 hours if needed Patient taking differently: Take 10 mg by mouth as needed for migraine. May repeat in 2 hours if needed 04/20/17  Yes Marcial Pacas, MD  sodium chloride (OCEAN) 0.65 % SOLN nasal spray Place 2 sprays into both nostrils as needed for congestion.   Yes [provider]  traMADol (ULTRAM) 50 MG tablet Take 50 mg by mouth 2 (two) times daily as needed for moderate pain.    Yes [provider]    Family History Family History  Problem Relation Age of Onset  . Asthma Mother   . Bipolar disorder Mother   . Heart disease Father   . Lymphoma Father   . Hypertension Father   . Thyroid disease Father   . Hyperlipidemia Father   . Diabetes Father   . Cancer Paternal Grandmother        lung and breast  . Bladder Cancer Paternal Grandfather   . Suicidality Maternal Grandfather   . Thyroid disease Brother     Social History Social  History   Tobacco Use  . Smoking status: Former Smoker    Packs/day: 0.75    Years: 20.00    Pack years: 15.00    Types: Cigarettes    Last attempt to quit: 06/07/2012    Years since quitting: 5.8  . Smokeless tobacco: Never Used  Substance Use Topics  . Alcohol use: No  . Drug use: No     Allergies   Ciprofloxacin; Levaquin [levofloxacin]; Buspar [buspirone]; Linaclotide; Advair diskus [fluticasone-salmeterol]; Biaxin [clarithromycin]; and Hydroxyzine   Review of Systems Review of Systems  Constitutional: Negative for activity change, appetite change, chills and fatigue.  HENT: Negative for congestion and rhinorrhea.   Respiratory: Negative for cough, chest tightness and shortness of breath.   Cardiovascular: Negative for chest pain.  Gastrointestinal: Positive for nausea. Negative for abdominal pain and vomiting.  Genitourinary: Negative for dysuria, hematuria, vaginal bleeding and vaginal discharge.  Musculoskeletal: Negative for arthralgias and myalgias.  Skin: Negative for rash.  Neurological: Negative for dizziness, weakness and headaches.   all other systems are negative except as noted in the HPI and PMH.     Physical Exam Updated Vital Signs BP (!) 145/62 (BP Location: Left Arm)   Pulse 76   Temp 98.1 F (36.7 C) (Oral)   Resp 16   Ht 5\' 9"  (1.753 m)   Wt 135.7 kg   SpO2 93%   BMI 44.18 kg/m   Physical Exam  Constitutional: She is oriented to person, place, and time. She appears well-developed and well-nourished. No distress.  HENT:  Head: Normocephalic and atraumatic.  Mouth/Throat: Oropharynx is clear and moist. No oropharyngeal exudate.  Moist mucus membranes  Eyes: Pupils are equal, round, and reactive to light. Conjunctivae and EOM are  normal.  Neck: Normal range of motion. Neck supple.  No meningismus.  Cardiovascular: Normal rate, regular rhythm, normal heart sounds and intact distal pulses.  No murmur heard. Pulmonary/Chest: Effort normal and  breath sounds normal. No respiratory distress.  Abdominal: Soft. There is no tenderness. There is no rebound and no guarding.  Musculoskeletal: Normal range of motion. She exhibits no edema or tenderness.  Neurological: She is alert and oriented to person, place, and time. No cranial nerve deficit. She exhibits normal muscle tone. Coordination normal.   5/5 strength throughout. CN 2-12 intact.Equal grip strength.   Skin: Skin is warm.  Psychiatric: She has a normal mood and affect. Her behavior is normal.  Nursing note and vitals reviewed.    ED Treatments / Results  Labs (all labs ordered are listed, but only abnormal results are displayed) Labs Reviewed  BASIC METABOLIC PANEL - Abnormal; Notable for the following components:      Result Value   Sodium 130 (*)    Potassium 5.6 (*)    Chloride 96 (*)    Glucose, Bld 613 (*)    BUN 34 (*)    Creatinine, Ser 2.03 (*)    Calcium 8.8 (*)    GFR calc non Af Amer 28 (*)    GFR calc Af Amer 33 (*)    All other components within normal limits  URINALYSIS, ROUTINE W REFLEX MICROSCOPIC - Abnormal; Notable for the following components:   Color, Urine STRAW (*)    Glucose, UA >=500 (*)    Ketones, ur 5 (*)    Bacteria, UA RARE (*)    All other components within normal limits  CBG MONITORING, ED - Abnormal; Notable for the following components:   Glucose-Capillary 572 (*)    All other components within normal limits  CBG MONITORING, ED - Abnormal; Notable for the following components:   Glucose-Capillary 521 (*)    All other components within normal limits  CBC  BLOOD GAS, VENOUS  POTASSIUM  I-STAT CG4 LACTIC ACID, ED  CBG MONITORING, ED    EKG None  Radiology No results found.  Procedures Procedures (including critical care time)  Medications Ordered in ED Medications  sodium chloride 0.9 % bolus 1,000 mL (has no administration in time range)  ondansetron (ZOFRAN) injection 4 mg (has no administration in time range)      Initial Impression / Assessment and Plan / ED Course  I have reviewed the triage vital signs and the nursing notes.  Pertinent labs & imaging results that were available during my care of the patient were reviewed by me and considered in my medical decision making (see chart for details).    Type I diabetic with hyperglycemia.  No recent infectious symptoms.  IV fluids and Zofran given.  Lactic acid is normal.  Normal bicarbonate and normal anion gap.  No evidence of DKA.  Hyperglycemia noted.  Patient will be hydrated and given insulin boluses.  Potassium 5.6.  Will recheck.  Patient will be hydrated given additional insulin.  Dr. Roderic Palau to assume care at shift change pending improvement in blood sugar and repeat potassium.  Final Clinical Impressions(s) / ED Diagnoses   Final diagnoses:  Hyperglycemia    ED Discharge Orders    None       Alverna Fawley, Annie Main, MD 04/09/18 [phone]

## 2018-04-10 LAB — I-STAT CG4 LACTIC ACID, ED: Lactic Acid, Venous: 1.11 mmol/L (ref 0.5–1.9)

## 2018-04-18 ENCOUNTER — Ambulatory Visit (HOSPITAL_COMMUNITY): Payer: BLUE CROSS/BLUE SHIELD | Admitting: Psychiatry

## 2018-04-23 ENCOUNTER — Encounter (HOSPITAL_COMMUNITY): Payer: Self-pay | Admitting: *Deleted

## 2018-04-23 ENCOUNTER — Other Ambulatory Visit: Payer: Self-pay

## 2018-04-23 ENCOUNTER — Emergency Department (HOSPITAL_COMMUNITY)
Admission: EM | Admit: 2018-04-23 | Discharge: 2018-04-24 | Disposition: A | Discharge status: Home / Self Care | Payer: BLUE CROSS/BLUE SHIELD | Attending: Emergency Medicine | Admitting: Emergency Medicine

## 2018-04-23 DIAGNOSIS — R739 Hyperglycemia, unspecified: Secondary | ICD-10-CM | POA: Diagnosis present

## 2018-04-23 DIAGNOSIS — Z794 Long term (current) use of insulin: Secondary | ICD-10-CM | POA: Insufficient documentation

## 2018-04-23 DIAGNOSIS — E039 Hypothyroidism, unspecified: Secondary | ICD-10-CM | POA: Insufficient documentation

## 2018-04-23 DIAGNOSIS — Z79899 Other long term (current) drug therapy: Secondary | ICD-10-CM | POA: Diagnosis not present

## 2018-04-23 DIAGNOSIS — N183 Chronic kidney disease, stage 3 (moderate): Secondary | ICD-10-CM | POA: Insufficient documentation

## 2018-04-23 DIAGNOSIS — Z7982 Long term (current) use of aspirin: Secondary | ICD-10-CM | POA: Diagnosis not present

## 2018-04-23 DIAGNOSIS — I129 Hypertensive chronic kidney disease with stage 1 through stage 4 chronic kidney disease, or unspecified chronic kidney disease: Secondary | ICD-10-CM | POA: Diagnosis not present

## 2018-04-23 DIAGNOSIS — Z87891 Personal history of nicotine dependence: Secondary | ICD-10-CM | POA: Diagnosis not present

## 2018-04-23 DIAGNOSIS — E1065 Type 1 diabetes mellitus with hyperglycemia: Secondary | ICD-10-CM | POA: Insufficient documentation

## 2018-04-23 LAB — CBG MONITORING, ED: GLUCOSE-CAPILLARY: 450 mg/dL — AB (ref 70–99)

## 2018-04-23 MED ORDER — INSULIN ASPART 100 UNIT/ML IV SOLN
8.0000 [IU] | Freq: Once | INTRAVENOUS | Status: AC
  Administered 2018-04-24: 8 [IU] via INTRAVENOUS

## 2018-04-23 MED ORDER — SODIUM CHLORIDE 0.9 % IV BOLUS
1000.0000 mL | Freq: Once | INTRAVENOUS | Status: AC
  Administered 2018-04-24: 1000 mL via INTRAVENOUS

## 2018-04-23 NOTE — ED Provider Notes (Signed)
[facility] EMERGENCY DEPARTMENT Provider Note   CSN: [id number] Arrival date & time: 04/23/18  2329     History   Chief Complaint Chief Complaint  Patient presents with  . Hyperglycemia    HPI Jasmine [name] is a 47 y.o. female.  Patient is a 47 year old female with history of type 1 diabetes, bipolar disorder, chronic fatigue, and fibromyalgia.  She presents today with complaints of elevated blood sugar.  She has an insulin pump attached to her abdomen which apparently came out some point today.  She felt thirsty and checked her blood sugar.  It was over 500 and she presents for evaluation of this.  She denies any fevers or chills.  She denies any dysuria.  The history is provided by the patient.  Hyperglycemia  Blood sugar level PTA:  500 Severity:  Moderate Onset quality:  Sudden Timing:  Constant Progression:  Unchanged Chronicity:  New Diabetes status:  Controlled with insulin Context: insulin pump use   Relieved by:  Nothing Ineffective treatments:  None tried   Past Medical History:  Diagnosis Date  . Anxiety   . Bipolar disorder ([place])   . Chronic fatigue   . CKD (chronic kidney disease), stage II   . Depression   . Diabetes mellitus   . DKA, type 1 ([place]) 11/04/2011  . Elevated cholesterol   . Fibromyalgia   . GERD (gastroesophageal reflux disease)   . Headache   . Hyperlipemia   . Hypertension   . Hypothyroidism   . IBS (irritable bowel syndrome)   . Obesity   . Sleep apnea    HAS C -PAP / DOES NOT USE  . Stress incontinence    Pt had surgery to correct this.  . Tachycardia   . Tobacco abuse   . UTI (lower urinary tract infection)     Patient Active Problem List   Diagnosis Date Noted  . Chronic migraine 01/17/2017  . Diabetic peripheral neuropathy ([place]) 01/17/2017  . OSA (obstructive sleep apnea) 07/27/2016  . Wheezing 07/27/2016  . Hyperglycemia 12/25/2012  . Acute on chronic renal failure ([place]) 12/25/2012  . Pulmonary infiltrates  10/02/2012  . Postnasal drip 10/02/2012  . Hyperkalemia 09/25/2012  . Chronic kidney disease, stage III (moderate) ([place]) 09/25/2012  . Morbid obesity ([place]) 09/24/2012  . HTN (hypertension) 09/24/2012  . Bipolar II disorder ([place]) 09/24/2012  . URI (upper respiratory infection) 09/24/2012  . DKA, type 1 ([place]) 11/04/2011  . Gastroenteritis 11/03/2011  . DM (diabetes mellitus), type 1, uncontrolled ([place]) 11/03/2011  . Hyponatremia 11/03/2011  . CKD (chronic kidney disease), stage II 11/03/2011  . Elevated lipase 11/03/2011  . Hypothyroidism 11/03/2011  . Tobacco abuse 11/03/2011  . SUI (stress urinary incontinence, female) 08/16/2011    Past Surgical History:  Procedure Laterality Date  . INCONTINENCE SURGERY    . NASAL FRACTURE SURGERY    . ovary removed    . OVARY SURGERY    . PUBOVAGINAL SLING  08/16/2011   Procedure: [name];  Surgeon: Bernestine Amass, MD;  Location: [facility]S;  Service: Urology;  Laterality: N/A;         . UTERINE FIBROID SURGERY  2001     OB History    Gravida  1   Para      Term      Preterm      AB      Living        SAB      TAB  Ectopic      Multiple      Live Births               Home Medications    Prior to Admission medications   Medication Sig Start Date End Date Taking? Authorizing Provider  acetaminophen (TYLENOL) 500 MG tablet Take 1,000 mg by mouth every 6 (six) hours as needed for moderate pain or headache.     [provider]  aspirin EC 81 MG tablet Take 81 mg by mouth at bedtime.     [provider]  Biotin 1 MG CAPS Take 1 capsule by mouth daily.    [provider]  Brexpiprazole (REXULTI) 0.5 MG TABS Take 1 tablet (0.5 mg total) by mouth daily. 03/16/18   Norman Clay, MD  buPROPion (WELLBUTRIN XL) 300 MG 24 hr tablet Take 1 tablet (300 mg total) by mouth daily. 02/14/18   Norman Clay, MD  carvedilol (COREG) 25 MG tablet Take 37.5 mg by mouth 2 (two) times daily with a  meal.     [provider]  cholecalciferol (VITAMIN D) 1000 UNITS tablet Take 1,000 Units by mouth daily.    [provider]  Coenzyme Q10 (CO Q 10 PO) Take 50 mg by mouth every evening.     [provider]  diazepam (VALIUM) 5 MG tablet Take 1 tablet (5 mg total) by mouth daily as needed for anxiety or muscle spasms. 03/16/18   Norman Clay, MD  Diclofenac Sodium (PENNSAID) 2 % SOLN Place 1 application onto the skin daily as needed (for back pain).    [provider]  dicyclomine (BENTYL) 10 MG capsule Take 10 mg by mouth 3 (three) times daily as needed for spasms.    [provider]  DULoxetine (CYMBALTA) 60 MG capsule Take 1 capsule (60 mg total) by mouth 2 (two) times daily. 02/14/18   Norman Clay, MD  fluticasone (FLONASE) 50 MCG/ACT nasal spray Place 2 sprays into the nose daily as needed for allergies.     [provider]  folic acid (FOLVITE) 756 MCG tablet Take 400 mcg by mouth every evening.     [provider]  furosemide (LASIX) 40 MG tablet Take 40 mg by mouth daily.     [provider]  gabapentin (NEURONTIN) 300 MG capsule Take 300 mg by mouth at bedtime.     [provider]  lamoTRIgine (LAMICTAL) 200 MG tablet Take 1 tablet (200 mg total) by mouth daily. 02/14/18   Norman Clay, MD  levothyroxine (SYNTHROID, LEVOTHROID) 150 MCG tablet Take 150 mcg by mouth daily before breakfast.    [provider]  lubiprostone (AMITIZA) 8 MCG capsule Take 8 mcg by mouth 2 (two) times a week.     [provider]  Naphazoline HCl (CLEAR EYES OP) Place 2 drops into both eyes 3 (three) times daily as needed (for dry eyes).    [provider]  NOVOLOG 100 UNIT/ML injection USE AS DIRECTED FOR INSULIN PUMP MAX DAILY DOSE 120 UNITS PER DAY 02/24/17   [provider]  Omega-3 Fatty Acids (FISH OIL PO) Take 1 capsule by mouth daily.    [provider]  omeprazole (PRILOSEC) 20 MG  capsule Take 20 mg by mouth at bedtime.     [provider]  ondansetron (ZOFRAN ODT) 4 MG disintegrating tablet Take 1 tablet (4 mg total) by mouth every 8 (eight) hours as needed. Patient taking differently: Take 4 mg by mouth every 8 (  eight) hours as needed for nausea or vomiting.  01/17/17   Marcial Pacas, MD  oxyCODONE-acetaminophen (PERCOCET/ROXICET) 5-325 MG tablet Take 2 tablets every 4 (four) hours as needed by mouth for severe pain. 04/15/17   Fransico Meadow, PA-C  pravastatin (PRAVACHOL) 80 MG tablet Take 80 mg by mouth at bedtime.    [provider]  PROAIR HFA 108 (90 Base) MCG/ACT inhaler INHALE TWO PUFFS INTO THE LUNGS EVERY 6 HOURS AS NEEDED FOR WHEEZING OR SHORTNESS OF BREATH 12/30/16   [provider]  rizatriptan (MAXALT-MLT) 10 MG disintegrating tablet Take 1 tablet (10 mg total) as needed by mouth. May repeat in 2 hours if needed Patient taking differently: Take 10 mg by mouth as needed for migraine. May repeat in 2 hours if needed 04/20/17   Marcial Pacas, MD  sodium chloride (OCEAN) 0.65 % SOLN nasal spray Place 2 sprays into both nostrils as needed for congestion.    [provider]  traMADol (ULTRAM) 50 MG tablet Take 50 mg by mouth 2 (two) times daily as needed for moderate pain.     [provider]    Family History Family History  Problem Relation Age of Onset  . Asthma Mother   . Bipolar disorder Mother   . Heart disease Father   . Lymphoma Father   . Hypertension Father   . Thyroid disease Father   . Hyperlipidemia Father   . Diabetes Father   . Cancer Paternal Grandmother        lung and breast  . Bladder Cancer Paternal Grandfather   . Suicidality Maternal Grandfather   . Thyroid disease Brother     Social History Social History   Tobacco Use  . Smoking status: Former Smoker    Packs/day: 0.75    Years: 20.00    Pack years: 15.00    Types: Cigarettes    Last attempt to quit: 06/07/2012    Years since quitting:  5.8  . Smokeless tobacco: Never Used  Substance Use Topics  . Alcohol use: No  . Drug use: No     Allergies   Ciprofloxacin; Levaquin [levofloxacin]; Buspar [buspirone]; Linaclotide; Advair diskus [fluticasone-salmeterol]; Biaxin [clarithromycin]; and Hydroxyzine   Review of Systems Review of Systems  All other systems reviewed and are negative.    Physical Exam Updated Vital Signs BP (!) 120/49   Pulse 72   Temp 98.2 F (36.8 C) (Oral)   Resp 20   Ht 5\' 9"  (1.753 m)   Wt 135 kg   SpO2 96%   BMI 43.95 kg/m   Physical Exam  Constitutional: She is oriented to person, place, and time. She appears well-developed and well-nourished. No distress.  HENT:  Head: Normocephalic and atraumatic.  Mouth/Throat: Oropharynx is clear and moist.  Neck: Normal range of motion. Neck supple.  Cardiovascular: Normal rate and regular rhythm. Exam reveals no gallop and no friction rub.  No murmur heard. Pulmonary/Chest: Effort normal and breath sounds normal. No respiratory distress. She has no wheezes.  Abdominal: Soft. Bowel sounds are normal. She exhibits no distension. There is no tenderness.  Musculoskeletal: Normal range of motion.  Neurological: She is alert and oriented to person, place, and time.  Skin: Skin is warm and dry. She is not diaphoretic.  Nursing note and vitals reviewed.    ED Treatments / Results  Labs (all labs ordered are listed, but only abnormal results are displayed) Labs Reviewed  CBG MONITORING, ED - Abnormal; Notable for the following components:  Result Value   Glucose-Capillary 450 (*)    All other components within normal limits  BASIC METABOLIC PANEL  CBC WITH DIFFERENTIAL/PLATELET  URINALYSIS, ROUTINE W REFLEX MICROSCOPIC    EKG None  Radiology No results found.  Procedures Procedures (including critical care time)  Medications Ordered in ED Medications  sodium chloride 0.9 % bolus 1,000 mL (has no administration in time range)    insulin aspart (novoLOG) injection 8 Units (has no administration in time range)     Initial Impression / Assessment and Plan / ED Course  I have reviewed the triage vital signs and the nursing notes.  Pertinent labs & imaging results that were available during my care of the patient were reviewed by me and considered in my medical decision making (see chart for details).  Patient presents with elevated blood sugars after her insulin pump became dislodged earlier today.  Her sugar was initially 450, and is now in the low 200s after receiving IV fluids and insulin.  Electrolytes do not show DKA.  The patient appears stable for discharge.  She is to resume use of her insulin pump and follow-up as needed for any problems.  Final Clinical Impressions(s) / ED Diagnoses   Final diagnoses:  None    ED Discharge Orders    None       Veryl Speak, MD 04/24/18 [phone]

## 2018-04-23 NOTE — ED Triage Notes (Signed)
Pt states that her blood sugar was reading over 500, pt states that her insulin pump had come loose from her abd and she is not sure how long she went without any insulin,

## 2018-04-24 LAB — URINALYSIS, ROUTINE W REFLEX MICROSCOPIC
Bilirubin Urine: NEGATIVE
Hgb urine dipstick: NEGATIVE
KETONES UR: NEGATIVE mg/dL
LEUKOCYTES UA: NEGATIVE
Nitrite: NEGATIVE
PROTEIN: 30 mg/dL — AB
Specific Gravity, Urine: 1.011 (ref 1.005–1.030)
pH: 6 (ref 5.0–8.0)

## 2018-04-24 LAB — CBC WITH DIFFERENTIAL/PLATELET
ABS IMMATURE GRANULOCYTES: 0.03 10*3/uL (ref 0.00–0.07)
Basophils Absolute: 0.1 10*3/uL (ref 0.0–0.1)
Basophils Relative: 1 %
Eosinophils Absolute: 0.2 10*3/uL (ref 0.0–0.5)
Eosinophils Relative: 2 %
HCT: 39.1 % (ref 36.0–46.0)
HEMOGLOBIN: 12 g/dL (ref 12.0–15.0)
IMMATURE GRANULOCYTES: 0 %
LYMPHS ABS: 2.5 10*3/uL (ref 0.7–4.0)
LYMPHS PCT: 24 %
MCH: 27.7 pg (ref 26.0–34.0)
MCHC: 30.7 g/dL (ref 30.0–36.0)
MCV: 90.3 fL (ref 80.0–100.0)
MONOS PCT: 6 %
Monocytes Absolute: 0.7 10*3/uL (ref 0.1–1.0)
NEUTROS ABS: 6.9 10*3/uL (ref 1.7–7.7)
Neutrophils Relative %: 67 %
PLATELETS: 255 10*3/uL (ref 150–400)
RBC: 4.33 MIL/uL (ref 3.87–5.11)
RDW: 13.3 % (ref 11.5–15.5)
WBC: 10.4 10*3/uL (ref 4.0–10.5)
nRBC: 0 % (ref 0.0–0.2)

## 2018-04-24 LAB — BASIC METABOLIC PANEL
Anion gap: 8 (ref 5–15)
BUN: 25 mg/dL — AB (ref 6–20)
CHLORIDE: 97 mmol/L — AB (ref 98–111)
CO2: 26 mmol/L (ref 22–32)
Calcium: 8.8 mg/dL — ABNORMAL LOW (ref 8.9–10.3)
Creatinine, Ser: 1.84 mg/dL — ABNORMAL HIGH (ref 0.44–1.00)
GFR calc Af Amer: 37 mL/min — ABNORMAL LOW (ref >60)
GFR calc non Af Amer: 32 mL/min — ABNORMAL LOW (ref >60)
GLUCOSE: 441 mg/dL — AB (ref 70–99)
POTASSIUM: 4.1 mmol/L (ref 3.5–5.1)
Sodium: 131 mmol/L — ABNORMAL LOW (ref 135–145)

## 2018-04-24 LAB — CBG MONITORING, ED: GLUCOSE-CAPILLARY: 219 mg/dL — AB (ref 70–99)

## 2018-04-24 NOTE — Discharge Instructions (Addendum)
Continue use of your insulin pump as before.  Return to the emergency department if you experience any new and/or concerning symptoms.
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
## 2018-05-02 ENCOUNTER — Encounter (HOSPITAL_COMMUNITY): Payer: Self-pay | Admitting: Psychiatry

## 2018-05-02 ENCOUNTER — Ambulatory Visit (HOSPITAL_COMMUNITY): Payer: BLUE CROSS/BLUE SHIELD | Admitting: Psychiatry

## 2018-05-02 ENCOUNTER — Ambulatory Visit (INDEPENDENT_AMBULATORY_CARE_PROVIDER_SITE_OTHER): Payer: BLUE CROSS/BLUE SHIELD | Admitting: Psychiatry

## 2018-05-02 VITALS — BP 176/96 | HR 74 | Ht 69.0 in | Wt 308.0 lb

## 2018-05-02 DIAGNOSIS — F3181 Bipolar II disorder: Secondary | ICD-10-CM

## 2018-05-02 MED ORDER — BUPROPION HCL ER (XL) 300 MG PO TB24: 300.0000 mg | ORAL_TABLET | Freq: Every day | ORAL | 0 refills | Status: DC

## 2018-05-02 MED ORDER — DULOXETINE HCL 60 MG PO CPEP: 60.0000 mg | ORAL_CAPSULE | Freq: Two times a day (BID) | ORAL | 0 refills | Status: DC

## 2018-05-02 MED ORDER — LAMOTRIGINE 200 MG PO TABS: 200.0000 mg | ORAL_TABLET | Freq: Every day | ORAL | 0 refills | Status: DC

## 2018-05-02 NOTE — Addendum Note (Signed)
Addended by: Norman Clay on: 05/02/2018 04:00 PM   Modules accepted: Orders

## 2018-05-02 NOTE — Patient Instructions (Signed)
1. Continue lamotrigine 200 mg daily  2.Continue duloxetine 60 mg twice a day  3.Increase Wellbutrin300 mg daily 4. Discontinue Rexulti  5.Return to clinic in one month for 30 mins

## 2018-05-10 ENCOUNTER — Telehealth (HOSPITAL_COMMUNITY): Payer: Self-pay | Admitting: *Deleted

## 2018-05-10 ENCOUNTER — Telehealth (HOSPITAL_COMMUNITY): Payer: Self-pay | Admitting: Psychiatry

## 2018-05-10 NOTE — Telephone Encounter (Signed)
Discussed with Dr. Raynald Kemp.  Patient was seen yesterday.  She mentioned about SI of cutting her wrist, although she denies any suicide attempt. Dr. Raynald Kemp is concerned about her mental status.   After having discussion, it appears that her mental status is consistent with the evaluation by this note writer a few weeks ago. She was referred to IOP, and there was medication adjustment.  Dr. Raynald Kemp feels comfortable with plans.   Tried to contact with the patient again- the patient did not answer the phone. (Previously left the voice message to contact our office.) Sent message to ensure IOP referral process.

## 2018-05-10 NOTE — Telephone Encounter (Signed)
Left voice message for both Dr. Raynald Kemp and the patient.

## 2018-05-10 NOTE — Telephone Encounter (Signed)
Dr Modesta Messing Patient's PCP  Called concerned that patient is sliding down hill after her visit yesterday. Patient did sign a Theatre manager, but PCP is very concerned Dr Webb Laws # [phone] Patient stated she feels she may need to go to [facility] ED. Patient's # is  [phone]  PCP asked for a call & to keep her in the loop

## 2018-05-11 ENCOUNTER — Telehealth (HOSPITAL_COMMUNITY): Payer: Self-pay | Admitting: Psychiatry

## 2018-05-11 NOTE — Telephone Encounter (Signed)
Dr Modesta Messing Patient called front office stating she was returning your call

## 2018-05-11 NOTE — Telephone Encounter (Signed)
Discussed with the patient. There is no change in SI of cutting wrist since the last visit. She tends to take valium and goes to sleep, or listens to music when she felt stressed from the thought. She denies any intent. She has not been able to go outside or go to church due to some injury in her foot secondary to diabetes. She agrees to contact emergency resources if there is worsening in SI. She states that IOP has not contacted with the patient (or contacted back), although she is still very interested. Reminder was sent to IOP yesterday. The patient will notify us if she does not hear back from IOP next week.

## 2018-05-11 NOTE — Telephone Encounter (Signed)
D:  Pt referred per Dr. Modesta Messing for [place].  A:  Placed call to pt to orient and give her a start date.  Pt became tearful and stated that her disability was taken and she has refiled and she doesn't have the gas money to drive to [facility].  Discussed [facility] [place] and how you can create a schedule as to when you wanted to attend groups.  Provided pt with their phone number.  R:  Pt receptive.
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
## 2018-06-05 ENCOUNTER — Encounter (HOSPITAL_COMMUNITY): Payer: Self-pay | Admitting: Psychiatry

## 2018-06-05 ENCOUNTER — Ambulatory Visit (INDEPENDENT_AMBULATORY_CARE_PROVIDER_SITE_OTHER): Payer: BLUE CROSS/BLUE SHIELD | Admitting: Psychiatry

## 2018-06-05 VITALS — BP 142/82 | HR 78 | Ht 69.0 in | Wt 308.0 lb

## 2018-06-05 DIAGNOSIS — F3181 Bipolar II disorder: Secondary | ICD-10-CM

## 2018-06-05 MED ORDER — DIAZEPAM 5 MG PO TABS: 2.5000 mg | ORAL_TABLET | Freq: Every day | ORAL | 0 refills | Status: DC | PRN

## 2018-06-05 NOTE — Patient Instructions (Signed)
1. Continue lamotrigine 200 mg daily  2.Continue duloxetine 60 mg twice a day  3.Continue Wellbutrin300 mg daily 4. Continue Valium 2.5 mg at night as needed for muscle spasm, sleep 5.Return to clinic in one month for 15 mins [facility] in [address], [place], [place] [address]

## 2018-07-01 ENCOUNTER — Emergency Department (HOSPITAL_COMMUNITY): Payer: BLUE CROSS/BLUE SHIELD

## 2018-07-01 ENCOUNTER — Other Ambulatory Visit: Payer: Self-pay

## 2018-07-01 ENCOUNTER — Emergency Department (HOSPITAL_COMMUNITY)
Admission: EM | Admit: 2018-07-01 | Discharge: 2018-07-01 | Disposition: A | Discharge status: Home / Self Care | Payer: BLUE CROSS/BLUE SHIELD | Attending: Emergency Medicine | Admitting: Emergency Medicine

## 2018-07-01 ENCOUNTER — Encounter (HOSPITAL_COMMUNITY): Payer: Self-pay | Admitting: Emergency Medicine

## 2018-07-01 DIAGNOSIS — Z7902 Long term (current) use of antithrombotics/antiplatelets: Secondary | ICD-10-CM | POA: Insufficient documentation

## 2018-07-01 DIAGNOSIS — Z7982 Long term (current) use of aspirin: Secondary | ICD-10-CM | POA: Diagnosis not present

## 2018-07-01 DIAGNOSIS — Z87891 Personal history of nicotine dependence: Secondary | ICD-10-CM | POA: Diagnosis not present

## 2018-07-01 DIAGNOSIS — I129 Hypertensive chronic kidney disease with stage 1 through stage 4 chronic kidney disease, or unspecified chronic kidney disease: Secondary | ICD-10-CM | POA: Insufficient documentation

## 2018-07-01 DIAGNOSIS — E039 Hypothyroidism, unspecified: Secondary | ICD-10-CM | POA: Insufficient documentation

## 2018-07-01 DIAGNOSIS — I10 Essential (primary) hypertension: Secondary | ICD-10-CM | POA: Diagnosis present

## 2018-07-01 DIAGNOSIS — N182 Chronic kidney disease, stage 2 (mild): Secondary | ICD-10-CM | POA: Insufficient documentation

## 2018-07-01 DIAGNOSIS — Z79899 Other long term (current) drug therapy: Secondary | ICD-10-CM | POA: Insufficient documentation

## 2018-07-01 DIAGNOSIS — G4733 Obstructive sleep apnea (adult) (pediatric): Secondary | ICD-10-CM | POA: Diagnosis present

## 2018-07-01 DIAGNOSIS — IMO0002 Reserved for concepts with insufficient information to code with codable children: Secondary | ICD-10-CM | POA: Diagnosis present

## 2018-07-01 DIAGNOSIS — N184 Chronic kidney disease, stage 4 (severe): Secondary | ICD-10-CM | POA: Diagnosis present

## 2018-07-01 DIAGNOSIS — E1022 Type 1 diabetes mellitus with diabetic chronic kidney disease: Secondary | ICD-10-CM | POA: Diagnosis not present

## 2018-07-01 DIAGNOSIS — J069 Acute upper respiratory infection, unspecified: Secondary | ICD-10-CM | POA: Diagnosis present

## 2018-07-01 DIAGNOSIS — E162 Hypoglycemia, unspecified: Secondary | ICD-10-CM | POA: Insufficient documentation

## 2018-07-01 DIAGNOSIS — E1065 Type 1 diabetes mellitus with hyperglycemia: Secondary | ICD-10-CM | POA: Diagnosis present

## 2018-07-01 DIAGNOSIS — F3181 Bipolar II disorder: Secondary | ICD-10-CM | POA: Diagnosis present

## 2018-07-01 LAB — CBC WITH DIFFERENTIAL/PLATELET
Abs Immature Granulocytes: 0.02 10*3/uL (ref 0.00–0.07)
Basophils Absolute: 0.1 10*3/uL (ref 0.0–0.1)
Basophils Relative: 1 %
Eosinophils Absolute: 0.2 10*3/uL (ref 0.0–0.5)
Eosinophils Relative: 2 %
HCT: 46.2 % — ABNORMAL HIGH (ref 36.0–46.0)
HEMOGLOBIN: 13.6 g/dL (ref 12.0–15.0)
Immature Granulocytes: 0 %
Lymphocytes Relative: 12 %
Lymphs Abs: 1.3 10*3/uL (ref 0.7–4.0)
MCH: 27.3 pg (ref 26.0–34.0)
MCHC: 29.4 g/dL — ABNORMAL LOW (ref 30.0–36.0)
MCV: 92.6 fL (ref 80.0–100.0)
Monocytes Absolute: 0.6 10*3/uL (ref 0.1–1.0)
Monocytes Relative: 5 %
NEUTROS PCT: 80 %
Neutro Abs: 8.5 10*3/uL — ABNORMAL HIGH (ref 1.7–7.7)
Platelets: 269 10*3/uL (ref 150–400)
RBC: 4.99 MIL/uL (ref 3.87–5.11)
RDW: 14.2 % (ref 11.5–15.5)
WBC: 10.6 10*3/uL — ABNORMAL HIGH (ref 4.0–10.5)
nRBC: 0 % (ref 0.0–0.2)

## 2018-07-01 LAB — BASIC METABOLIC PANEL
ANION GAP: 10 (ref 5–15)
BUN: 32 mg/dL — ABNORMAL HIGH (ref 6–20)
CHLORIDE: 94 mmol/L — AB (ref 98–111)
CO2: 29 mmol/L (ref 22–32)
Calcium: 8.7 mg/dL — ABNORMAL LOW (ref 8.9–10.3)
Creatinine, Ser: 2.5 mg/dL — ABNORMAL HIGH (ref 0.44–1.00)
GFR calc Af Amer: 26 mL/min — ABNORMAL LOW (ref >60)
GFR calc non Af Amer: 22 mL/min — ABNORMAL LOW (ref >60)
Glucose, Bld: 298 mg/dL — ABNORMAL HIGH (ref 70–99)
Potassium: 4.2 mmol/L (ref 3.5–5.1)
Sodium: 133 mmol/L — ABNORMAL LOW (ref 135–145)

## 2018-07-01 LAB — BLOOD GAS, ARTERIAL
Acid-Base Excess: 1.7 mmol/L (ref 0.0–2.0)
Acid-Base Excess: 1.5 mmol/L (ref 0.0–2.0)
Bicarbonate: 25.2 mmol/L (ref 20.0–28.0)
Bicarbonate: 24.9 mmol/L (ref 20.0–28.0)
DRAWN BY: 277331
FIO2: 24
FIO2: 28
O2 Saturation: 75.4 %
O2 Saturation: 91.2 %
PO2 ART: 39.7 mmHg — AB (ref 83.0–108.0)
Patient temperature: 37
Patient temperature: 37
pCO2 arterial: 42 mmHg (ref 32.0–48.0)
pCO2 arterial: 51.3 mmHg — ABNORMAL HIGH (ref 32.0–48.0)
pH, Arterial: 7.407 (ref 7.350–7.450)
pH, Arterial: 7.337 — ABNORMAL LOW (ref 7.350–7.450)
pO2, Arterial: 67.5 mmHg — ABNORMAL LOW (ref 83.0–108.0)

## 2018-07-01 LAB — D-DIMER, QUANTITATIVE (NOT AT ARMC): D DIMER QUANT: 0.27 ug{FEU}/mL (ref 0.00–0.50)

## 2018-07-01 LAB — CBG MONITORING, ED
Glucose-Capillary: 173 mg/dL — ABNORMAL HIGH (ref 70–99)
Glucose-Capillary: 443 mg/dL — ABNORMAL HIGH (ref 70–99)
Glucose-Capillary: 310 mg/dL — ABNORMAL HIGH (ref 70–99)

## 2018-07-01 MED ORDER — SODIUM CHLORIDE 0.9 % IV BOLUS
1000.0000 mL | Freq: Once | INTRAVENOUS | Status: AC
  Administered 2018-07-01: 1000 mL via INTRAVENOUS

## 2018-07-01 NOTE — ED Triage Notes (Addendum)
Pt had decreased loc upon ems arrival. cbg was in 50's.  Given a full amp d50.  Sugar was 200's before arriving to ED.  Pt is awake and alert at this time.  Pt recently dx with pne, still on abx.  Sats were 88% on r/a

## 2018-07-01 NOTE — ED Provider Notes (Addendum)
[facility] EMERGENCY DEPARTMENT Provider Note   CSN: [id number] Arrival date & time: 07/01/18  1014     History   Chief Complaint Chief Complaint  Patient presents with  . Hypoglycemia    HPI Jasmine [name] is a 48 y.o. female.  History is obtained from patient and from patient's husband.  Patient's husband reports that this morning at approximately 6:30 AM he asked for something to drink.  She became somewhat less responsive.  Preop on her phone alarmed stating that she had abnormal blood sugar.  EMS obtained CBG noted blood sugar to be in the 50s.  She was treated with D50 1 amp venous.  She did not eat breakfast this morning and does not eat breakfast routinely.  She reports that her endocrinologist Dr. [name] adjusted her insulin pump last week, as her blood sugars have been running high.  She presently feels sleepy otherwise asymptomatic her pulse oximetry was noted to be 88% on room air on arrival she reports cough for the past 2 weeks diagnosed with "walking pneumonia" this past week and treated with "Z-Pak".  Took first dose yesterday  HPI  Past Medical History:  Diagnosis Date  . Anxiety   . Bipolar disorder ([place])   . Chronic fatigue   . CKD (chronic kidney disease), stage II   . Depression   . Diabetes mellitus   . DKA, type 1 ([place]) 11/04/2011  . Elevated cholesterol   . Fibromyalgia   . GERD (gastroesophageal reflux disease)   . Headache   . Hyperlipemia   . Hypertension   . Hypothyroidism   . IBS (irritable bowel syndrome)   . Obesity   . Sleep apnea    HAS C -PAP / DOES NOT USE  . Stress incontinence    Pt had surgery to correct this.  . Tachycardia   . Tobacco abuse   . UTI (lower urinary tract infection)     Patient Active Problem List   Diagnosis Date Noted  . Chronic migraine 01/17/2017  . Diabetic peripheral neuropathy ([place]) 01/17/2017  . OSA (obstructive sleep apnea) 07/27/2016  . Wheezing 07/27/2016  . Hyperglycemia 12/25/2012  . Acute on  chronic renal failure ([place]) 12/25/2012  . Pulmonary infiltrates 10/02/2012  . Postnasal drip 10/02/2012  . Hyperkalemia 09/25/2012  . Chronic kidney disease, stage III (moderate) ([place]) 09/25/2012  . Morbid obesity ([place]) 09/24/2012  . HTN (hypertension) 09/24/2012  . Bipolar II disorder ([place]) 09/24/2012  . URI (upper respiratory infection) 09/24/2012  . DKA, type 1 ([place]) 11/04/2011  . Gastroenteritis 11/03/2011  . DM (diabetes mellitus), type 1, uncontrolled ([place]) 11/03/2011  . Hyponatremia 11/03/2011  . CKD (chronic kidney disease), stage II 11/03/2011  . Elevated lipase 11/03/2011  . Hypothyroidism 11/03/2011  . Tobacco abuse 11/03/2011  . SUI (stress urinary incontinence, female) 08/16/2011    Past Surgical History:  Procedure Laterality Date  . INCONTINENCE SURGERY    . NASAL FRACTURE SURGERY    . ovary removed    . OVARY SURGERY    . PUBOVAGINAL SLING  08/16/2011   Procedure: [name];  Surgeon: Bernestine Amass, MD;  Location: [facility]S;  Service: Urology;  Laterality: N/A;         . UTERINE FIBROID SURGERY  2001     OB History    Gravida  1   Para      Term      Preterm      AB      Living  SAB      TAB      Ectopic      Multiple      Live Births               Home Medications    Prior to Admission medications   Medication Sig Start Date End Date Taking? Authorizing Provider  acetaminophen (TYLENOL) 500 MG tablet Take 1,000 mg by mouth every 6 (six) hours as needed for moderate pain or headache.    Yes [provider]  aspirin EC 81 MG tablet Take 81 mg by mouth at bedtime.    Yes [provider]  buPROPion (WELLBUTRIN XL) 300 MG 24 hr tablet Take 1 tablet (300 mg total) by mouth daily. 05/02/18  Yes Hisada, Elie Goody, MD  carvedilol (COREG) 25 MG tablet Take 37.5 mg by mouth 2 (two) times daily with a meal.    Yes [provider]  cholecalciferol (VITAMIN D) 1000 UNITS tablet Take 1,000 Units by mouth  daily.   Yes [provider]  diazepam (VALIUM) 5 MG tablet Take 0.5 tablets (2.5 mg total) by mouth daily as needed for anxiety or muscle spasms. 06/05/18  Yes Hisada, Elie Goody, MD  Diclofenac Sodium (PENNSAID) 2 % SOLN Place 1 application onto the skin daily as needed (for back pain).   Yes [provider]  dicyclomine (BENTYL) 10 MG capsule Take 10 mg by mouth 3 (three) times daily as needed for spasms.   Yes [provider]  DULoxetine (CYMBALTA) 60 MG capsule Take 1 capsule (60 mg total) by mouth 2 (two) times daily. 05/02/18  Yes Hisada, Elie Goody, MD  fluticasone (FLONASE) 50 MCG/ACT nasal spray Place 2 sprays into the nose daily as needed for allergies.    Yes [provider]  folic acid (FOLVITE) 527 MCG tablet Take 400 mcg by mouth every evening.    Yes [provider]  furosemide (LASIX) 40 MG tablet Take 40 mg by mouth daily.    Yes [provider]  gabapentin (NEURONTIN) 300 MG capsule Take 300 mg by mouth at bedtime.    Yes [provider]  HYDROMET 5-1.5 MG/5ML syrup Take 5 mLs by mouth every 6 (six) hours as needed for cough.  06/30/18  Yes [provider]  lamoTRIgine (LAMICTAL) 200 MG tablet Take 1 tablet (200 mg total) by mouth daily. 05/02/18  Yes Hisada, Elie Goody, MD  levothyroxine (SYNTHROID, LEVOTHROID) 150 MCG tablet Take 150 mcg by mouth daily before breakfast.   Yes [provider]  lubiprostone (AMITIZA) 8 MCG capsule Take 8 mcg by mouth 2 (two) times daily with a meal.    Yes [provider]  Naphazoline HCl (CLEAR EYES OP) Place 2 drops into both eyes 3 (three) times daily as needed (for dry eyes).   Yes [provider]  NOVOLOG 100 UNIT/ML injection USE AS DIRECTED FOR INSULIN PUMP MAX DAILY DOSE 120 UNITS PER DAY 02/24/17  Yes [provider]  Omega-3 Fatty Acids (FISH OIL PO) Take 1 capsule by mouth daily.   Yes [provider]  omeprazole (PRILOSEC) 20 MG capsule  Take 20 mg by mouth at bedtime.    Yes [provider]  ondansetron (ZOFRAN ODT) 4 MG disintegrating tablet Take 1 tablet (4 mg total) by mouth every 8 (eight) hours as needed. Patient taking differently: Take 4 mg by mouth every 8 (eight) hours as needed for nausea or vomiting.  01/17/17  Yes Marcial Pacas, MD  pravastatin (PRAVACHOL) 80 MG  tablet Take 80 mg by mouth at bedtime.   Yes [provider]  PROAIR HFA 108 (90 Base) MCG/ACT inhaler INHALE TWO PUFFS INTO THE LUNGS EVERY 6 HOURS AS NEEDED FOR WHEEZING OR SHORTNESS OF BREATH 12/30/16  Yes [provider]  rizatriptan (MAXALT-MLT) 10 MG disintegrating tablet Take 1 tablet (10 mg total) as needed by mouth. May repeat in 2 hours if needed Patient taking differently: Take 10 mg by mouth as needed for migraine. May repeat in 2 hours if needed 04/20/17  Yes Marcial Pacas, MD    Family History Family History  Problem Relation Age of Onset  . Asthma Mother   . Bipolar disorder Mother   . Heart disease Father   . Lymphoma Father   . Hypertension Father   . Thyroid disease Father   . Hyperlipidemia Father   . Diabetes Father   . Cancer Paternal Grandmother        lung and breast  . Bladder Cancer Paternal Grandfather   . Suicidality Maternal Grandfather   . Thyroid disease Brother     Social History Social History   Tobacco Use  . Smoking status: Former Smoker    Packs/day: 0.75    Years: 20.00    Pack years: 15.00    Types: Cigarettes    Last attempt to quit: 06/07/2012    Years since quitting: 6.0  . Smokeless tobacco: Never Used  Substance Use Topics  . Alcohol use: No  . Drug use: No     Allergies   Ciprofloxacin; Levaquin [levofloxacin]; Buspar [buspirone]; Linaclotide; Advair diskus [fluticasone-salmeterol]; Biaxin [clarithromycin]; and Hydroxyzine   Review of Systems Review of Systems  Constitutional: Positive for fatigue.  Respiratory: Positive for cough.   Genitourinary:       Amenorrheic  for greater than 1 year  Allergic/Immunologic: Positive for immunocompromised state.  All other systems reviewed and are negative.    Physical Exam Updated Vital Signs BP 138/82 (BP Location: Right Arm)   Pulse 65   Temp 97.6 F (36.4 C) (Oral)   Resp 12   Ht 5\' 9"  (1.753 m)   Wt (!) 140.2 kg   SpO2 91%   BMI 45.63 kg/m   Physical Exam Vitals signs and nursing note reviewed.  Constitutional:      Appearance: She is well-developed.  HENT:     Head: Normocephalic and atraumatic.  Eyes:     Conjunctiva/sclera: Conjunctivae normal.     Pupils: Pupils are equal, round, and reactive to light.  Neck:     Musculoskeletal: Neck supple.     Thyroid: No thyromegaly.     Trachea: No tracheal deviation.  Cardiovascular:     Rate and Rhythm: Normal rate and regular rhythm.     Heart sounds: No murmur.  Pulmonary:     Effort: Pulmonary effort is normal.     Breath sounds: Normal breath sounds.  Abdominal:     General: Bowel sounds are normal. There is no distension.     Palpations: Abdomen is soft.     Tenderness: There is no abdominal tenderness.  Musculoskeletal: Normal range of motion.        General: No tenderness.  Skin:    General: Skin is warm and dry.     Findings: No rash.  Neurological:     Mental Status: She is alert.     Coordination: Coordination normal.      ED Treatments / Results  Labs (all labs ordered are listed, but only abnormal  results are displayed) Labs Reviewed  CBG MONITORING, ED - Abnormal; Notable for the following components:      Result Value   Glucose-Capillary 173 (*)    All other components within normal limits  BLOOD GAS, ARTERIAL  CBC WITH DIFFERENTIAL/PLATELET  BASIC METABOLIC PANEL    EKG None  Radiology No results found.  Procedures Procedures (including critical care time)  Medications Ordered in ED Medications - No data to display Chest x-ray viewed by me Results for orders placed or performed during the hospital  encounter of 07/01/18  Blood gas, arterial  Result Value Ref Range   FIO2 24.00    pH, Arterial 7.407 7.350 - 7.450   pCO2 arterial 42.0 32.0 - 48.0 mmHg   pO2, Arterial 39.7 (LL) 83.0 - 108.0 mmHg   Bicarbonate 25.2 20.0 - 28.0 mmol/L   Acid-Base Excess 1.7 0.0 - 2.0 mmol/L   O2 Saturation 75.4 %   Patient temperature 37.0    Allens test (pass/fail) PASS PASS  CBC with Differential/Platelet  Result Value Ref Range   WBC 10.6 (H) 4.0 - 10.5 K/uL   RBC 4.99 3.87 - 5.11 MIL/uL   Hemoglobin 13.6 12.0 - 15.0 g/dL   HCT 46.2 (H) 36.0 - 46.0 %   MCV 92.6 80.0 - 100.0 fL   MCH 27.3 26.0 - 34.0 pg   MCHC 29.4 (L) 30.0 - 36.0 g/dL   RDW 14.2 11.5 - 15.5 %   Platelets 269 150 - 400 K/uL   nRBC 0.0 0.0 - 0.2 %   Neutrophils Relative % 80 %   Neutro Abs 8.5 (H) 1.7 - 7.7 K/uL   Lymphocytes Relative 12 %   Lymphs Abs 1.3 0.7 - 4.0 K/uL   Monocytes Relative 5 %   Monocytes Absolute 0.6 0.1 - 1.0 K/uL   Eosinophils Relative 2 %   Eosinophils Absolute 0.2 0.0 - 0.5 K/uL   Basophils Relative 1 %   Basophils Absolute 0.1 0.0 - 0.1 K/uL   Immature Granulocytes 0 %   Abs Immature Granulocytes 0.02 0.00 - 0.07 K/uL  Basic metabolic panel  Result Value Ref Range   Sodium 133 (L) 135 - 145 mmol/L   Potassium 4.2 3.5 - 5.1 mmol/L   Chloride 94 (L) 98 - 111 mmol/L   CO2 29 22 - 32 mmol/L   Glucose, Bld 298 (H) 70 - 99 mg/dL   BUN 32 (H) 6 - 20 mg/dL   Creatinine, Ser 2.50 (H) 0.44 - 1.00 mg/dL   Calcium 8.7 (L) 8.9 - 10.3 mg/dL   GFR calc non Af Amer 22 (L) >60 mL/min   GFR calc Af Amer 26 (L) >60 mL/min   Anion gap 10 5 - 15  Blood gas, arterial  Result Value Ref Range   FIO2 28.00    Delivery systems NASAL CANNULA    pH, Arterial 7.337 (L) 7.350 - 7.450   pCO2 arterial 51.3 (H) 32.0 - 48.0 mmHg   pO2, Arterial 67.5 (L) 83.0 - 108.0 mmHg   Bicarbonate 24.9 20.0 - 28.0 mmol/L   Acid-Base Excess 1.5 0.0 - 2.0 mmol/L   O2 Saturation 91.2 %   Patient temperature 37.0    Collection  site RIGHT RADIAL    Drawn by [id number]    Sample type ARTERIAL DRAW    Allens test (pass/fail) PASS PASS  D-dimer, quantitative (not at [facility])  Result Value Ref Range   D-Dimer, Quant 0.27 0.00 - 0.50 ug/mL-FEU  CBG monitoring, ED  Result  Value Ref Range   Glucose-Capillary 173 (H) 70 - 99 mg/dL   Dg Chest 2 View  Result Date: 07/01/2018 CLINICAL DATA:  Cough. Decreased level consciousness. EXAM: CHEST - 2 VIEW COMPARISON:  11/25/2015 FINDINGS: The heart size and mediastinal contours are within normal limits. Very low lung volumes are seen with mild atelectasis in right lung base. No evidence of pulmonary consolidation or pleural effusion. IMPRESSION: 1. Very low lung volumes with mild right basilar atelectasis. 2. Lungs otherwise clear. Electronically Signed   By: Earle Gell M.D.   On: 07/01/2018 12:08  Chest x-ray viewed by me Initial Impression / Assessment and Plan / ED Course  I have reviewed the triage vital signs and the nursing notes.  Pertinent labs & imaging results that were available during my care of the patient were reviewed by me and considered in my medical decision making (see chart for details). I consulted Dr.Emokpae will evaluate patient in the emergency department.  IV hydration ordered, as as patient has mild acute on chronic renal failure insufficiency.  History of blood gas felt to be factitious.  Repeat blood gas shows acceptable PO2 pretest clinical suspicion for pulmonary embolism is low.  Negative d-dimer 0.27   At 3:30 PM she feels improved after treatment with intravenous hydration. Dr.Emokpae's consult is appreciated.  Plan encourage oral hydration.  Patient is encouraged to eat 5 or 6 small meals daily.  I attempted to contact her endocrinologist Dr. [name] by telephone.  Got recorded message.  No answering service or physician on call available.  Patient is encouraged to contact Dr. [name] in 2 days regarding insulin pump and possible adjustment and  hypoglycemia.  Final Clinical Impressions(s) / ED Diagnoses  Diagnosis #1 hypoglycemia #2  renal insufficiency Final diagnoses:  None    ED Discharge Orders    None       Orlie Dakin, MD 07/01/18 1538    Orlie Dakin, MD 07/01/18 1540

## 2018-07-01 NOTE — Consult Note (Signed)
Patient Demographics:    Jasmine [name], is a 48 y.o. female  MRN: [id number]   DOB - 1971-02-23  Admit Date - 07/01/2018  Outpatient Primary MD for the patient is Aletha Halim., PA-C   Assessment & Plan:    Principal Problem:   Transient Hypoglycemia Active Problems:   DM (diabetes mellitus), type 1, uncontrolled ([place])   Hypothyroidism   Morbid obesity (HCC)   HTN (hypertension)   Bipolar II disorder (HCC)   URI (upper respiratory infection)   OSA (obstructive sleep apnea)   CKD (chronic kidney disease), stage IV (HCC)   1)Transient hypoglycemia due to not eating breakfast----after D50 and meal tray blood sugars now consistently above 200 and last blood sugar was actually over 440,... Patient has given herself additional insulin per protocol from her insulin pump-----patient and her husband tells me that her last A1c was 7.8 about a month ago----endocrinologist Dr. [name] recently adjusted her insulin pump settings, patient is to eat small frequent meals,------ follow instructions from endocrinologist and follow-up with endocrinologist as previously scheduled.... Hypoglycemia has resolved completely... Please note that patient is on short acting insulin through the insulin pump,  she is not on long-acting insulin, patient has no vomiting and no diarrhea  2)CKD IV----patient's baseline creatinine is usually above 2, please see creatinine from 01/12/2018 and creatinine from November 2019, creatinine up to 2.5 due to poor oral intake in the setting of recent URI symptoms for the last couple of weeks----discussed with EDP, advised 2 L of IV normal saline in the ED, patient with then continue to drink frequently at home, patient will repeat BMP with PCP on Monday, 07/03/2018.Marland KitchenMarland KitchenMarland KitchenMarland Kitchen Please note the patient has no vomiting  or diarrhea, no barriers to ability to hydrate orally  3)URI sxs----chest x-ray without frank pneumonia, clinically no significant hypoxia noted at this time, d-dimer not elevated, symptomatic and supportive treatment advised  4)Morbid Obesity/OSA--- noncompliant with CPAP, lifestyle dietary modifications discussed   With History of - Reviewed by me  Past Medical History:  Diagnosis Date  . Anxiety   . Bipolar disorder ([place])   . Chronic fatigue   . CKD (chronic kidney disease), stage II   . Depression   . Diabetes mellitus   . DKA, type 1 ([place]) 11/04/2011  . Elevated cholesterol   . Fibromyalgia   . GERD (gastroesophageal reflux disease)   . Headache   . Hyperlipemia   . Hypertension   . Hypothyroidism   . IBS (irritable bowel syndrome)   . Obesity   . Sleep apnea    HAS C -PAP / DOES NOT USE  . Stress incontinence    Pt had surgery to correct this.  . Tachycardia   . Tobacco abuse   . UTI (lower urinary tract infection)       Past Surgical History:  Procedure Laterality Date  . INCONTINENCE SURGERY    . NASAL FRACTURE SURGERY    . ovary removed    .  OVARY SURGERY    . PUBOVAGINAL SLING  08/16/2011   Procedure: [name];  Surgeon: Bernestine Amass, MD;  Location: [facility];  Service: Urology;  Laterality: N/A;         . [place] SURGERY  2001      Chief Complaint  Patient presents with  . Hypoglycemia      HPI:    Jasmine [name]  is a 48 y.o. female who is a reformed smoker with past medical history relevant for bipolar disorder, diabetes mellitus, morbid obesity with OSA, hypertension, anxiety disorder, CKD 4 with baseline creatinine above 2 who presents to the ED by with concerns about hypoglycemic episode this morning with blood sugars in the 50s According to patient's husband patient was not as alert as usual, patient has a glucose monitoring device on her skin which alarmed that blood sugar was abnormal--when EMS arrived blood sugar was  in the 50s, patient has an insulin pump, patient does not use long-acting insulin, patient has had no vomiting or no diarrhea.... This note the patient did not eat breakfast this a.m., After giving D50 blood sugar went up to the 200s, after being given a meal tray in the ED patient blood sugar is now above 400--- patient given herself additional insulin per protocol from a insulin pump device  On further questioning patient and her husband tell me that A1c was 7.8 about a month ago, patient's endocrinologist Dr. [name] adjusted her insulin pump recently  Patient and her husband tell me that she has had URI symptoms for a couple of weeks now, with occasional dry cough but mostly congestion malaise and fatigue.... No fevers, no chills, no sore throat, no vomiting, no diarrhea no abdominal pain  No Sick contacts at home, patient was seen recently for her URI symptoms and prescribed azithromycin which she started on 06/30/2018  In the ED initial ABG appeared to have been "erroneous".... O2 sats on the screen is 92 to 94% on room air, due to recent URI symptoms chest x-ray was done, no acute findings on chest x-ray today in the ED, d-dimer is not elevated  No chest pain, no palpitations, no dizziness, no pleuritic symptoms, no significant shortness of breath  Additional history obtained from patient's husband at bedside  EDP--- Dr. Winfred Leeds consulted [facility] for concerns about hypoglycemia and creatinine of 2.50  Discussed with Dr Winfred Leeds------ patient can be discharged home after completing 2 L of IV normal saline, with instructions to keep adequate hydration, eat small frequent meals, and follow-up with PCP on Monday, 07/03/2018 for repeat BMP     Review of systems:    In addition to the HPI above,   A full Review of  Systems was done, all other systems reviewed are negative except as noted above in HPI , .   Social History:  Reviewed by me    Social History    Tobacco Use  . Smoking status: Former Smoker    Packs/day: 0.75    Years: 20.00    Pack years: 15.00    Types: Cigarettes    Last attempt to quit: 06/07/2012    Years since quitting: 6.0  . Smokeless tobacco: Never Used  Substance Use Topics  . Alcohol use: No     Family History :  Reviewed by me    Family History  Problem Relation Age of Onset  . Asthma Mother   . Bipolar disorder Mother   . Heart disease Father   .  Lymphoma Father   . Hypertension Father   . Thyroid disease Father   . Hyperlipidemia Father   . Diabetes Father   . Cancer Paternal Grandmother        lung and breast  . Bladder Cancer Paternal Grandfather   . Suicidality Maternal Grandfather   . Thyroid disease Brother      Home Medications:   Prior to Admission medications   Medication Sig Start Date End Date Taking? Authorizing Provider  acetaminophen (TYLENOL) 500 MG tablet Take 1,000 mg by mouth every 6 (six) hours as needed for moderate pain or headache.    Yes [provider]  aspirin EC 81 MG tablet Take 81 mg by mouth at bedtime.    Yes [provider]  buPROPion (WELLBUTRIN XL) 300 MG 24 hr tablet Take 1 tablet (300 mg total) by mouth daily. 05/02/18  Yes Hisada, Elie Goody, MD  carvedilol (COREG) 25 MG tablet Take 37.5 mg by mouth 2 (two) times daily with a meal.    Yes [provider]  cholecalciferol (VITAMIN D) 1000 UNITS tablet Take 1,000 Units by mouth daily.   Yes [provider]  diazepam (VALIUM) 5 MG tablet Take 0.5 tablets (2.5 mg total) by mouth daily as needed for anxiety or muscle spasms. 06/05/18  Yes Hisada, Elie Goody, MD  Diclofenac Sodium (PENNSAID) 2 % SOLN Place 1 application onto the skin daily as needed (for back pain).   Yes [provider]  dicyclomine (BENTYL) 10 MG capsule Take 10 mg by mouth 3 (three) times daily as needed for spasms.   Yes [provider]  DULoxetine (CYMBALTA) 60 MG capsule Take 1 capsule (60 mg total) by  mouth 2 (two) times daily. 05/02/18  Yes Hisada, Elie Goody, MD  fluticasone (FLONASE) 50 MCG/ACT nasal spray Place 2 sprays into the nose daily as needed for allergies.    Yes [provider]  folic acid (FOLVITE) 983 MCG tablet Take 400 mcg by mouth every evening.    Yes [provider]  furosemide (LASIX) 40 MG tablet Take 40 mg by mouth daily.    Yes [provider]  gabapentin (NEURONTIN) 300 MG capsule Take 300 mg by mouth at bedtime.    Yes [provider]  HYDROMET 5-1.5 MG/5ML syrup Take 5 mLs by mouth every 6 (six) hours as needed for cough.  06/30/18  Yes [provider]  lamoTRIgine (LAMICTAL) 200 MG tablet Take 1 tablet (200 mg total) by mouth daily. 05/02/18  Yes Hisada, Elie Goody, MD  levothyroxine (SYNTHROID, LEVOTHROID) 150 MCG tablet Take 150 mcg by mouth daily before breakfast.   Yes [provider]  lubiprostone (AMITIZA) 8 MCG capsule Take 8 mcg by mouth 2 (two) times daily with a meal.    Yes [provider]  Naphazoline HCl (CLEAR EYES OP) Place 2 drops into both eyes 3 (three) times daily as needed (for dry eyes).   Yes [provider]  NOVOLOG 100 UNIT/ML injection USE AS DIRECTED FOR INSULIN PUMP MAX DAILY DOSE 120 UNITS PER DAY 02/24/17  Yes [provider]  Omega-3 Fatty Acids (FISH OIL PO) Take 1 capsule by mouth daily.   Yes [provider]  omeprazole (PRILOSEC) 20 MG capsule Take 20 mg by mouth at bedtime.    Yes [provider]  ondansetron (ZOFRAN ODT) 4 MG disintegrating tablet Take 1 tablet (4 mg total) by mouth every 8 (eight) hours as needed. Patient taking differently: Take 4 mg by mouth every 8 (  eight) hours as needed for nausea or vomiting.  01/17/17  Yes Marcial Pacas, MD  pravastatin (PRAVACHOL) 80 MG tablet Take 80 mg by mouth at bedtime.   Yes [provider]  PROAIR HFA 108 (90 Base) MCG/ACT inhaler INHALE TWO PUFFS INTO THE LUNGS EVERY 6 HOURS AS NEEDED FOR  WHEEZING OR SHORTNESS OF BREATH 12/30/16  Yes [provider]  rizatriptan (MAXALT-MLT) 10 MG disintegrating tablet Take 1 tablet (10 mg total) as needed by mouth. May repeat in 2 hours if needed Patient taking differently: Take 10 mg by mouth as needed for migraine. May repeat in 2 hours if needed 04/20/17  Yes Marcial Pacas, MD     Allergies:     Allergies  Allergen Reactions  . Ciprofloxacin Swelling and Other (See Comments)    Per pt caused lips swell and nauseous feeling  . Levaquin [Levofloxacin] Swelling and Other (See Comments)    Per pt caused lips swell and nauseous feeling  . Buspar [Buspirone] Other (See Comments)    abd cramping  . Linaclotide Other (See Comments)  . Advair Diskus [Fluticasone-Salmeterol] Other (See Comments)    Thrush   . Biaxin [Clarithromycin] Rash  . Hydroxyzine Palpitations     Physical Exam:   Vitals  Blood pressure 132/66, pulse 88, temperature 97.6 F (36.4 C), temperature source Oral, resp. rate 14, height 5\' 9"  (1.753 m), weight (!) 140.2 kg, SpO2 94 %.  Physical Examination: General appearance - alert, morbidly obese appearing, and in no distress  Mental status - alert, oriented to person, place, and time,  Eyes - sclera anicteric Neck - supple, no JVD elevation , Chest - clear  to auscultation bilaterally, symmetrical air movement,  Heart - S1 and S2 normal, regular  Abdomen - soft, nontender, nondistended, no masses or organomegaly, increased truncal adiposity, no CVA area tenderness Neurological - screening mental status exam normal, neck supple without rigidity, cranial nerves II through XII intact, DTR's normal and symmetric Extremities - no pedal edema noted, intact peripheral pulses , right ankle/foot with brace/device on Skin - warm, dry     Data Review:    CBC Recent Labs  Lab 07/01/18 1120  WBC 10.6*  HGB 13.6  HCT 46.2*  PLT 269  MCV 92.6  MCH 27.3  MCHC 29.4*  RDW 14.2  LYMPHSABS 1.3  MONOABS 0.6    EOSABS 0.2  BASOSABS 0.1   ------------------------------------------------------------------------------------------------------------------  Chemistries  Recent Labs  Lab 07/01/18 1120  NA 133*  K 4.2  CL 94*  CO2 29  GLUCOSE 298*  BUN 32*  CREATININE 2.50*  CALCIUM 8.7*   ------------------------------------------------------------------------------------------------------------------ estimated creatinine clearance is 42.5 mL/min (A) (by C-G formula based on SCr of 2.5 mg/dL (H)). ------------------------------------------------------------------------------------------------------------------ No results for input(s): TSH, T4TOTAL, T3FREE, THYROIDAB in the last 72 hours.  Invalid input(s): FREET3   Coagulation profile No results for input(s): INR, PROTIME in the last 168 hours. ------------------------------------------------------------------------------------------------------------------- Recent Labs    07/01/18 1120  DDIMER 0.27   -------------------------------------------------------------------------------------------------------------------  Cardiac Enzymes No results for input(s): CKMB, TROPONINI, MYOGLOBIN in the last 168 hours.  Invalid input(s): CK ------------------------------------------------------------------------------------------------------------------ No results found for: BNP   ---------------------------------------------------------------------------------------------------------------  Urinalysis    Component Value Date/Time   COLORURINE YELLOW 04/24/2018 0000   APPEARANCEUR HAZY (A) 04/24/2018 0000   LABSPEC 1.011 04/24/2018 0000   PHURINE 6.0 04/24/2018 0000   GLUCOSEU >=500 (A) 04/24/2018 0000   HGBUR NEGATIVE 04/24/2018 0000   BILIRUBINUR NEGATIVE 04/24/2018 0000   KETONESUR NEGATIVE 04/24/2018 0000  PROTEINUR 30 (A) 04/24/2018 0000   UROBILINOGEN 0.2 03/19/2015 1525   NITRITE NEGATIVE 04/24/2018 0000   LEUKOCYTESUR  NEGATIVE 04/24/2018 0000    ----------------------------------------------------------------------------------------------------------------   Imaging Results:    Dg Chest 2 View  Result Date: 07/01/2018 CLINICAL DATA:  Cough. Decreased level consciousness. EXAM: CHEST - 2 VIEW COMPARISON:  11/25/2015 FINDINGS: The heart size and mediastinal contours are within normal limits. Very low lung volumes are seen with mild atelectasis in right lung base. No evidence of pulmonary consolidation or pleural effusion. IMPRESSION: 1. Very low lung volumes with mild right basilar atelectasis. 2. Lungs otherwise clear. Electronically Signed   By: Earle Gell M.D.   On: 07/01/2018 12:08    Radiological Exams on Admission: Dg Chest 2 View  Result Date: 07/01/2018 CLINICAL DATA:  Cough. Decreased level consciousness. EXAM: CHEST - 2 VIEW COMPARISON:  11/25/2015 FINDINGS: The heart size and mediastinal contours are within normal limits. Very low lung volumes are seen with mild atelectasis in right lung base. No evidence of pulmonary consolidation or pleural effusion. IMPRESSION: 1. Very low lung volumes with mild right basilar atelectasis. 2. Lungs otherwise clear. Electronically Signed   By: Earle Gell M.D.   On: 07/01/2018 12:08     AM Labs Ordered, also please review Full Orders  Family Communication: Admission, patients condition and plan of care including tests being ordered have been discussed with the patient and Husband who indicate understanding and agree with the plan   Code Status - Full Code  Likely DC to  Home   Condition   stable  Roxan Hockey M.D on 07/01/2018 at 3:16 PM Go to [email or web address] -  for contact info  [facility] - Office  [phone]

## 2018-07-01 NOTE — ED Notes (Signed)
Pt given graham crackers, peanut butter, applesauce and diet soda

## 2018-07-01 NOTE — Discharge Instructions (Signed)
Call your endocrinologist Dr. [name] in 2 days to let him know about today's low blood sugar.  He may need to make further adjustments on your insulin pump.  Make sure that you eat 5 or 6 small meals daily.  Do not skip any meals.  It is important to eat breakfast.  If your blood sugar falls below 60, eat something sweet immediately and recheck blood sugar after 1 hour.  If you cannot get your blood sugar to increase after 1 hour call 911 or return to the emergency department if you feel that your condition is worsening for any reason.Make sure that you drink at least six 8 ounce glasses of water  each day in order to stay well-hydrated.

## 2018-07-01 NOTE — ED Notes (Signed)
Date and time results received: 07/01/18 12:44 PM  (use smartphrase ".now" to insert current time)  Test: pO2 Critical Value: 39.7  Name of Provider Notified: Jacobowitz  Orders Received? Or Actions Taken?: Orders Received - See Orders for details

## 2018-07-01 NOTE — ED Notes (Signed)
Pt sats 88% on ra. Placed on 2L o2 via . sats now 94%.

## 2018-07-08 ENCOUNTER — Ambulatory Visit (HOSPITAL_COMMUNITY)
Admission: RE | Admit: 2018-07-08 | Discharge: 2018-07-08 | Disposition: A | Discharge status: Home / Self Care | Payer: BLUE CROSS/BLUE SHIELD | Source: Home / Self Care | Attending: Psychiatry | Admitting: Psychiatry

## 2018-07-08 ENCOUNTER — Emergency Department (EMERGENCY_DEPARTMENT_HOSPITAL)
Admission: EM | Admit: 2018-07-08 | Discharge: 2018-07-10 | Disposition: A | Discharge status: Other Acute Inpatient Hospital | Payer: BLUE CROSS/BLUE SHIELD | Source: Home / Self Care | Attending: Emergency Medicine | Admitting: Emergency Medicine

## 2018-07-08 DIAGNOSIS — Z833 Family history of diabetes mellitus: Secondary | ICD-10-CM | POA: Insufficient documentation

## 2018-07-08 DIAGNOSIS — Z87891 Personal history of nicotine dependence: Secondary | ICD-10-CM | POA: Insufficient documentation

## 2018-07-08 DIAGNOSIS — G4730 Sleep apnea, unspecified: Secondary | ICD-10-CM

## 2018-07-08 DIAGNOSIS — K589 Irritable bowel syndrome without diarrhea: Secondary | ICD-10-CM

## 2018-07-08 DIAGNOSIS — T424X2A Poisoning by benzodiazepines, intentional self-harm, initial encounter: Secondary | ICD-10-CM

## 2018-07-08 DIAGNOSIS — N182 Chronic kidney disease, stage 2 (mild): Secondary | ICD-10-CM | POA: Insufficient documentation

## 2018-07-08 DIAGNOSIS — Z79899 Other long term (current) drug therapy: Secondary | ICD-10-CM | POA: Insufficient documentation

## 2018-07-08 DIAGNOSIS — Z7902 Long term (current) use of antithrombotics/antiplatelets: Secondary | ICD-10-CM

## 2018-07-08 DIAGNOSIS — Z8249 Family history of ischemic heart disease and other diseases of the circulatory system: Secondary | ICD-10-CM | POA: Insufficient documentation

## 2018-07-08 DIAGNOSIS — Z794 Long term (current) use of insulin: Secondary | ICD-10-CM

## 2018-07-08 DIAGNOSIS — F3181 Bipolar II disorder: Secondary | ICD-10-CM | POA: Insufficient documentation

## 2018-07-08 DIAGNOSIS — R45851 Suicidal ideations: Secondary | ICD-10-CM

## 2018-07-08 DIAGNOSIS — Z888 Allergy status to other drugs, medicaments and biological substances status: Secondary | ICD-10-CM

## 2018-07-08 DIAGNOSIS — IMO0002 Reserved for concepts with insufficient information to code with codable children: Secondary | ICD-10-CM | POA: Diagnosis present

## 2018-07-08 DIAGNOSIS — I129 Hypertensive chronic kidney disease with stage 1 through stage 4 chronic kidney disease, or unspecified chronic kidney disease: Secondary | ICD-10-CM | POA: Insufficient documentation

## 2018-07-08 DIAGNOSIS — E1065 Type 1 diabetes mellitus with hyperglycemia: Secondary | ICD-10-CM | POA: Diagnosis present

## 2018-07-08 DIAGNOSIS — F315 Bipolar disorder, current episode depressed, severe, with psychotic features: Secondary | ICD-10-CM | POA: Insufficient documentation

## 2018-07-08 DIAGNOSIS — N184 Chronic kidney disease, stage 4 (severe): Secondary | ICD-10-CM

## 2018-07-08 DIAGNOSIS — E039 Hypothyroidism, unspecified: Secondary | ICD-10-CM | POA: Insufficient documentation

## 2018-07-08 DIAGNOSIS — Z8349 Family history of other endocrine, nutritional and metabolic diseases: Secondary | ICD-10-CM | POA: Insufficient documentation

## 2018-07-08 DIAGNOSIS — E1022 Type 1 diabetes mellitus with diabetic chronic kidney disease: Secondary | ICD-10-CM

## 2018-07-08 DIAGNOSIS — G43709 Chronic migraine without aura, not intractable, without status migrainosus: Secondary | ICD-10-CM | POA: Diagnosis present

## 2018-07-08 LAB — RAPID URINE DRUG SCREEN, HOSP PERFORMED
Amphetamines: NOT DETECTED
Barbiturates: NOT DETECTED
Benzodiazepines: POSITIVE — AB
Cocaine: NOT DETECTED
Opiates: NOT DETECTED
Tetrahydrocannabinol: NOT DETECTED

## 2018-07-08 LAB — COMPREHENSIVE METABOLIC PANEL
ALT: 12 U/L (ref 0–44)
ANION GAP: 12 (ref 5–15)
AST: 13 U/L — ABNORMAL LOW (ref 15–41)
Albumin: 3.7 g/dL (ref 3.5–5.0)
Alkaline Phosphatase: 99 U/L (ref 38–126)
BUN: 28 mg/dL — ABNORMAL HIGH (ref 6–20)
CO2: 27 mmol/L (ref 22–32)
Calcium: 9.1 mg/dL (ref 8.9–10.3)
Chloride: 96 mmol/L — ABNORMAL LOW (ref 98–111)
Creatinine, Ser: 2.47 mg/dL — ABNORMAL HIGH (ref 0.44–1.00)
GFR calc non Af Amer: 23 mL/min — ABNORMAL LOW (ref >60)
GFR, EST AFRICAN AMERICAN: 26 mL/min — AB (ref >60)
Glucose, Bld: 403 mg/dL — ABNORMAL HIGH (ref 70–99)
Potassium: 4.2 mmol/L (ref 3.5–5.1)
SODIUM: 135 mmol/L (ref 135–145)
Total Bilirubin: 0.9 mg/dL (ref 0.3–1.2)
Total Protein: 7.3 g/dL (ref 6.5–8.1)

## 2018-07-08 LAB — CBC WITH DIFFERENTIAL/PLATELET
Abs Immature Granulocytes: 0.02 10*3/uL (ref 0.00–0.07)
Basophils Absolute: 0.1 10*3/uL (ref 0.0–0.1)
Basophils Relative: 1 %
Eosinophils Absolute: 0.2 10*3/uL (ref 0.0–0.5)
Eosinophils Relative: 3 %
HCT: 41.9 % (ref 36.0–46.0)
Hemoglobin: 12.8 g/dL (ref 12.0–15.0)
Immature Granulocytes: 0 %
Lymphocytes Relative: 22 %
Lymphs Abs: 1.7 10*3/uL (ref 0.7–4.0)
MCH: 28.1 pg (ref 26.0–34.0)
MCHC: 30.5 g/dL (ref 30.0–36.0)
MCV: 91.9 fL (ref 80.0–100.0)
MONO ABS: 0.6 10*3/uL (ref 0.1–1.0)
Monocytes Relative: 7 %
NEUTROS ABS: 5.1 10*3/uL (ref 1.7–7.7)
Neutrophils Relative %: 67 %
Platelets: 257 10*3/uL (ref 150–400)
RBC: 4.56 MIL/uL (ref 3.87–5.11)
RDW: 14.6 % (ref 11.5–15.5)
WBC: 7.7 10*3/uL (ref 4.0–10.5)
nRBC: 0 % (ref 0.0–0.2)

## 2018-07-08 LAB — URINALYSIS, ROUTINE W REFLEX MICROSCOPIC
BILIRUBIN URINE: NEGATIVE
Glucose, UA: 150 mg/dL — AB
HGB URINE DIPSTICK: NEGATIVE
Ketones, ur: 5 mg/dL — AB
Leukocytes, UA: NEGATIVE
NITRITE: NEGATIVE
Protein, ur: 100 mg/dL — AB
Specific Gravity, Urine: 1.009 (ref 1.005–1.030)
pH: 6 (ref 5.0–8.0)

## 2018-07-08 LAB — CBG MONITORING, ED
Glucose-Capillary: 392 mg/dL — ABNORMAL HIGH (ref 70–99)
Glucose-Capillary: 447 mg/dL — ABNORMAL HIGH (ref 70–99)
Glucose-Capillary: 452 mg/dL — ABNORMAL HIGH (ref 70–99)
Glucose-Capillary: 321 mg/dL — ABNORMAL HIGH (ref 70–99)

## 2018-07-08 LAB — SALICYLATE LEVEL: Salicylate Lvl: <7 mg/dL (ref 2.8–30.0)

## 2018-07-08 LAB — I-STAT BETA HCG BLOOD, ED (MC, WL, AP ONLY): I-stat hCG, quantitative: 9.6 m[IU]/mL — ABNORMAL HIGH (ref <5)

## 2018-07-08 LAB — ACETAMINOPHEN LEVEL: Acetaminophen (Tylenol), Serum: <10 ug/mL — ABNORMAL LOW (ref 10–30)

## 2018-07-08 LAB — ETHANOL

## 2018-07-08 MED ORDER — ACETAMINOPHEN 500 MG PO TABS: 1000.0000 mg | ORAL_TABLET | Freq: Four times a day (QID) | ORAL | Status: DC | PRN

## 2018-07-08 MED ORDER — BUPROPION HCL ER (XL) 150 MG PO TB24
300.0000 mg | ORAL_TABLET | Freq: Every day | ORAL | Status: DC
  Administered 2018-07-08 – 2018-07-09 (×2): 300 mg via ORAL
  Filled 2018-07-08 (×2): qty 2

## 2018-07-08 MED ORDER — PRAVASTATIN SODIUM 20 MG PO TABS
80.0000 mg | ORAL_TABLET | Freq: Every day | ORAL | Status: DC
  Administered 2018-07-08 – 2018-07-09 (×2): 80 mg via ORAL
  Filled 2018-07-08 (×2): qty 4

## 2018-07-08 MED ORDER — INSULIN ASPART 100 UNIT/ML ~~LOC~~ SOLN
10.0000 [IU] | Freq: Once | SUBCUTANEOUS | Status: AC
  Administered 2018-07-08: 10 [IU] via SUBCUTANEOUS
  Filled 2018-07-08: qty 1

## 2018-07-08 MED ORDER — FUROSEMIDE 40 MG PO TABS
40.0000 mg | ORAL_TABLET | Freq: Every day | ORAL | Status: DC
  Administered 2018-07-09 – 2018-07-10 (×2): 40 mg via ORAL
  Filled 2018-07-08 (×2): qty 1

## 2018-07-08 MED ORDER — DICLOFENAC SODIUM 1 % TD GEL
1.0000 "application " | Freq: Every day | TRANSDERMAL | Status: DC | PRN
  Filled 2018-07-08: qty 100

## 2018-07-08 MED ORDER — DULOXETINE HCL 30 MG PO CPEP
60.0000 mg | ORAL_CAPSULE | Freq: Two times a day (BID) | ORAL | Status: DC
  Administered 2018-07-08 – 2018-07-10 (×4): 60 mg via ORAL
  Filled 2018-07-08 (×4): qty 2

## 2018-07-08 MED ORDER — ALBUTEROL SULFATE HFA 108 (90 BASE) MCG/ACT IN AERS: 1.0000 | INHALATION_SPRAY | Freq: Four times a day (QID) | RESPIRATORY_TRACT | Status: DC | PRN

## 2018-07-08 MED ORDER — LEVOTHYROXINE SODIUM 150 MCG PO TABS
150.0000 ug | ORAL_TABLET | Freq: Every day | ORAL | Status: DC
  Administered 2018-07-09 – 2018-07-10 (×2): 150 ug via ORAL
  Filled 2018-07-08 (×3): qty 1

## 2018-07-08 MED ORDER — INSULIN ASPART 100 UNIT/ML ~~LOC~~ SOLN
3.0000 [IU] | Freq: Three times a day (TID) | SUBCUTANEOUS | Status: DC
  Administered 2018-07-09: 3 [IU] via SUBCUTANEOUS

## 2018-07-08 MED ORDER — PANTOPRAZOLE SODIUM 40 MG PO TBEC
40.0000 mg | DELAYED_RELEASE_TABLET | Freq: Every day | ORAL | Status: DC
  Administered 2018-07-09 – 2018-07-10 (×2): 40 mg via ORAL
  Filled 2018-07-08 (×2): qty 1

## 2018-07-08 MED ORDER — LAMOTRIGINE 100 MG PO TABS
200.0000 mg | ORAL_TABLET | Freq: Every day | ORAL | Status: DC
  Administered 2018-07-08 – 2018-07-09 (×2): 200 mg via ORAL
  Filled 2018-07-08 (×2): qty 2

## 2018-07-08 MED ORDER — ONDANSETRON 4 MG PO TBDP
4.0000 mg | ORAL_TABLET | Freq: Three times a day (TID) | ORAL | Status: DC | PRN
  Administered 2018-07-09: 4 mg via ORAL
  Filled 2018-07-08: qty 1

## 2018-07-08 MED ORDER — GABAPENTIN 300 MG PO CAPS
300.0000 mg | ORAL_CAPSULE | Freq: Every day | ORAL | Status: DC
  Administered 2018-07-08 – 2018-07-09 (×2): 300 mg via ORAL
  Filled 2018-07-08 (×2): qty 1

## 2018-07-08 MED ORDER — SODIUM CHLORIDE 0.9 % IV BOLUS
1000.0000 mL | Freq: Once | INTRAVENOUS | Status: AC
  Administered 2018-07-08: 1000 mL via INTRAVENOUS

## 2018-07-08 MED ORDER — ASPIRIN EC 81 MG PO TBEC
81.0000 mg | DELAYED_RELEASE_TABLET | Freq: Every day | ORAL | Status: DC
  Administered 2018-07-08 – 2018-07-09 (×2): 81 mg via ORAL
  Filled 2018-07-08 (×2): qty 1

## 2018-07-08 MED ORDER — VITAMIN D 25 MCG (1000 UNIT) PO TABS
1000.0000 [IU] | ORAL_TABLET | Freq: Every day | ORAL | Status: DC
  Administered 2018-07-09 – 2018-07-10 (×2): 1000 [IU] via ORAL
  Filled 2018-07-08 (×2): qty 1

## 2018-07-08 MED ORDER — INSULIN ASPART 100 UNIT/ML ~~LOC~~ SOLN
0.0000 [IU] | Freq: Three times a day (TID) | SUBCUTANEOUS | Status: DC
  Administered 2018-07-09: 8 [IU] via SUBCUTANEOUS
  Administered 2018-07-10 (×2): 5 [IU] via SUBCUTANEOUS
  Filled 2018-07-08 (×3): qty 1

## 2018-07-08 MED ORDER — DIAZEPAM 5 MG PO TABS: 2.5000 mg | ORAL_TABLET | Freq: Every day | ORAL | Status: DC | PRN

## 2018-07-08 MED ORDER — CARVEDILOL 25 MG PO TABS
37.5000 mg | ORAL_TABLET | Freq: Two times a day (BID) | ORAL | Status: DC
  Administered 2018-07-09 – 2018-07-10 (×3): 37.5 mg via ORAL
  Filled 2018-07-08 (×4): qty 1

## 2018-07-08 MED ORDER — DICYCLOMINE HCL 10 MG PO CAPS
10.0000 mg | ORAL_CAPSULE | Freq: Three times a day (TID) | ORAL | Status: DC | PRN
  Filled 2018-07-08: qty 1

## 2018-07-08 MED ORDER — INSULIN ASPART 100 UNIT/ML ~~LOC~~ SOLN
0.0000 [IU] | Freq: Every day | SUBCUTANEOUS | Status: DC
  Administered 2018-07-08: 4 [IU] via SUBCUTANEOUS
  Administered 2018-07-09: 5 [IU] via SUBCUTANEOUS
  Filled 2018-07-08 (×2): qty 1

## 2018-07-08 MED ORDER — SUMATRIPTAN SUCCINATE 50 MG PO TABS
50.0000 mg | ORAL_TABLET | ORAL | Status: DC | PRN
  Filled 2018-07-08: qty 1

## 2018-07-08 MED ORDER — FOLIC ACID 1 MG PO TABS
1000.0000 ug | ORAL_TABLET | Freq: Every evening | ORAL | Status: DC
  Administered 2018-07-08 – 2018-07-09 (×2): 1 mg via ORAL
  Filled 2018-07-08 (×2): qty 1

## 2018-07-08 MED ORDER — LUBIPROSTONE 8 MCG PO CAPS
8.0000 ug | ORAL_CAPSULE | ORAL | Status: DC | PRN
  Filled 2018-07-08: qty 1

## 2018-07-08 NOTE — ED Notes (Signed)
CBG 447, PA made aware.

## 2018-07-08 NOTE — ED Notes (Signed)
Pt family member approached sitter and stated that pt wanted to leave AMA.  PA Benjamine Mola made aware.

## 2018-07-08 NOTE — ED Notes (Signed)
Family at bedside. 

## 2018-07-08 NOTE — ED Notes (Addendum)
Pt dressed out in purple scrubs, belongings (1 backpack, 1 purse containing insulin pump and cell phone, and plastic pt belonging bag) placed in bags and put into cupboards for section 1-4.  Pt was informed of standard rules for patients here for medical clearance as it pertains to visitors, having personal possessions. While in the room, PA asked pt to remove insulin pump so that it can be placed in with rest of secured belongings). Security wanded pt and her boot for contraband.  Pt appeared tearful, acknowledging verbal contract to not attempt any self harm while in [place].

## 2018-07-08 NOTE — ED Notes (Signed)
White stone ring placed in specimen cup with label and put with pt belongings.

## 2018-07-08 NOTE — ED Notes (Signed)
Message sent to pharmacy to request reschedule of pt lamotrigine and wellbutrin doses, as these are scheduled for the morning, but pt takes these meds at night. Tonights nightly doses given.

## 2018-07-08 NOTE — H&P (Signed)
Behavioral Health Medical Screening Exam  Jasmine [name] is an 48 y.o. female.  Total Time spent with patient: 15 minutes Patient present to [facility] as a walking with suicidal ideations and worsening depression. Reports she is prdx Valium 2.5mg  and states taking "15 mg of Valium, but nothing is helping me" . States multiple stressors related to unemployment and recently being fired from a job after 1 week. Patient to be transferred to for medical clearances  with inpatient recommendation.      Psychiatric Specialty Exam: Physical Exam  Constitutional: She appears well-developed.  Psychiatric: She has a normal mood and affect. Her behavior is normal.    Review of Systems  Psychiatric/Behavioral: Positive for depression and suicidal ideas. The patient is nervous/anxious.   All other systems reviewed and are negative.   Blood pressure (!) 141/78, pulse 74, temperature 97.7 F (36.5 C), temperature source Oral, resp. rate 18, SpO2 98 %.There is no height or weight on file to calculate BMI.  General Appearance: Casual and Guarded  Eye Contact:  Fair  Speech:  Clear and Coherent  Volume:  Normal  Mood:  Anxious and Depressed  Affect:  Depressed  Thought Process:  Coherent  Orientation:  Full (Time, Place, and Person)  Thought Content:  Hallucinations: None  Suicidal Thoughts:  Yes.  with intent/plan  Homicidal Thoughts:  No  Memory:  Immediate;   Fair Recent;   Fair Remote;   Fair  Judgement:  Fair  Insight:  Fair  Psychomotor Activity:  Normal  Concentration: Concentration: Fair  Recall:  [employer] of Knowledge:Fair  Language: Fair  Akathisia:  No  Handed:  Right  AIMS (if indicated):     Assets:  Communication Skills Desire for Improvement Resilience Social Support  Sleep:       Musculoskeletal: Strength & Muscle Tone: within normal limits Gait & Station: normal Patient leans: N/A  Blood pressure (!) 141/78, pulse 74, temperature 97.7 F (36.5 C), temperature source  Oral, resp. rate 18, SpO2 98 %.  Recommendations: Inpatient admission   Based on my evaluation the patient appears to have an emergency medical condition for which I recommend the patient be transferred to the emergency department for further evaluation.  Derrill Center, NP 07/08/2018, 4:06 PM
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
## 2018-07-08 NOTE — ED Notes (Signed)
Pt CBG 452 RN Claiborne Billings G notified

## 2018-07-08 NOTE — ED Provider Notes (Signed)
[facility] DEPT Provider Note   CSN: [id number] Arrival date & time: 07/08/18  1623     History   Chief Complaint Chief Complaint  Patient presents with  . Suicidal    HPI Jasmine [name] is a 48 y.o. female with a past medical history of bipolar, DM 1, CKD, fibromyalgia, depression, anxiety, who presents today for evaluation of depression and benzodiazepine overdose.  She took 15 mg of Valium at noon, she also took 10 mg this morning when she woke up and 10 mg last night.  Her Valium was reportedly prescribed to her however she is supposed to take 2.5 to 5 mg as needed.  She tells me that this was not a suicide attempt, rather that she did not want to be awake and wanted to sleep so she would not have to "deal with" the stressors in her life.  She reports occasional auditory hallucinations of people whispering, denies visual hallucinations.  She denies HI.  She has endorsed SI with a plan to slit her wrist.  Her husband and father took her to [facility] who recommended inpatient treatment and sent her here for evaluation.  She reports that she has had mild abdominal pain since last night.  She states that when she was in the waiting room she realized that her insulin pump had detached and was not giving her her insulin.  She has a CGM that showed her sugar was in the high 300s, and corrected for that while in waiting room.  She reports she received the full correction dose from her pump.    She reports nausea with out vomiting.    HPI  Past Medical History:  Diagnosis Date  . Anxiety   . Bipolar disorder ([place])   . Chronic fatigue   . CKD (chronic kidney disease), stage II   . Depression   . Diabetes mellitus   . DKA, type 1 ([place]) 11/04/2011  . Elevated cholesterol   . Fibromyalgia   . GERD (gastroesophageal reflux disease)   . Headache   . Hyperlipemia   . Hypertension   . Hypothyroidism   . IBS (irritable bowel syndrome)   . Obesity   . Sleep apnea     HAS C -PAP / DOES NOT USE  . Stress incontinence    Pt had surgery to correct this.  . Tachycardia   . Tobacco abuse   . UTI (lower urinary tract infection)     Patient Active Problem List   Diagnosis Date Noted  . CKD (chronic kidney disease), stage IV ([place]) 07/01/2018  . Transient Hypoglycemia 07/01/2018  . Chronic migraine 01/17/2017  . Diabetic peripheral neuropathy ([place]) 01/17/2017  . OSA (obstructive sleep apnea) 07/27/2016  . Wheezing 07/27/2016  . Hyperglycemia 12/25/2012  . Acute on chronic renal failure ([place]) 12/25/2012  . Pulmonary infiltrates 10/02/2012  . Postnasal drip 10/02/2012  . Hyperkalemia 09/25/2012  . Morbid obesity ([place]) 09/24/2012  . HTN (hypertension) 09/24/2012  . Bipolar II disorder ([place]) 09/24/2012  . URI (upper respiratory infection) 09/24/2012  . DKA, type 1 ([place]) 11/04/2011  . Gastroenteritis 11/03/2011  . DM (diabetes mellitus), type 1, uncontrolled ([place]) 11/03/2011  . Hyponatremia 11/03/2011  . Elevated lipase 11/03/2011  . Hypothyroidism 11/03/2011  . Tobacco abuse 11/03/2011  . SUI (stress urinary incontinence, female) 08/16/2011    Past Surgical History:  Procedure Laterality Date  . INCONTINENCE SURGERY    . NASAL FRACTURE SURGERY    . ovary removed    .  OVARY SURGERY    . PUBOVAGINAL SLING  08/16/2011   Procedure: [name];  Surgeon: Bernestine Amass, MD;  Location: [facility] ORS;  Service: Urology;  Laterality: N/A;         . UTERINE FIBROID SURGERY  2001     OB History    Gravida  1   Para      Term      Preterm      AB      Living        SAB      TAB      Ectopic      Multiple      Live Births               Home Medications    Prior to Admission medications   Medication Sig Start Date End Date Taking? Authorizing Provider  acetaminophen (TYLENOL) 500 MG tablet Take 1,000 mg by mouth every 6 (six) hours as needed for moderate pain or headache.    Yes [provider]  aspirin EC  81 MG tablet Take 81 mg by mouth at bedtime.    Yes [provider]  buPROPion (WELLBUTRIN XL) 300 MG 24 hr tablet Take 1 tablet (300 mg total) by mouth daily. 05/02/18  Yes Hisada, Elie Goody, MD  carvedilol (COREG) 25 MG tablet Take 37.5 mg by mouth 2 (two) times daily with a meal.    Yes [provider]  cholecalciferol (VITAMIN D) 1000 UNITS tablet Take 1,000 Units by mouth daily.   Yes [provider]  diazepam (VALIUM) 5 MG tablet Take 0.5 tablets (2.5 mg total) by mouth daily as needed for anxiety or muscle spasms. 06/05/18  Yes Hisada, Elie Goody, MD  Diclofenac Sodium (PENNSAID) 2 % SOLN Place 1 application onto the skin daily as needed (for back pain).   Yes [provider]  dicyclomine (BENTYL) 10 MG capsule Take 10 mg by mouth 3 (three) times daily as needed for spasms.   Yes [provider]  DULoxetine (CYMBALTA) 60 MG capsule Take 1 capsule (60 mg total) by mouth 2 (two) times daily. 05/02/18  Yes Hisada, Elie Goody, MD  fluticasone (FLONASE) 50 MCG/ACT nasal spray Place 2 sprays into the nose daily as needed for allergies.    Yes [provider]  folic acid (FOLVITE) 272 MCG tablet Take 400 mcg by mouth every evening.    Yes [provider]  furosemide (LASIX) 40 MG tablet Take 40 mg by mouth daily.    Yes [provider]  gabapentin (NEURONTIN) 300 MG capsule Take 300 mg by mouth at bedtime.    Yes [provider]  lamoTRIgine (LAMICTAL) 200 MG tablet Take 1 tablet (200 mg total) by mouth daily. 05/02/18  Yes Hisada, Elie Goody, MD  levothyroxine (SYNTHROID, LEVOTHROID) 150 MCG tablet Take 150 mcg by mouth daily before breakfast.   Yes [provider]  lubiprostone (AMITIZA) 8 MCG capsule Take 8 mcg by mouth as needed for constipation.    Yes [provider]  NOVOLOG 100 UNIT/ML injection Inject 2.425-2.95 Units into the skin daily. Via pump 7pm to midnight 2.950 8:30am -7pm2.925 Midnight - 8:30 2.425  02/24/17  Yes [provider]  Omega-3 Fatty Acids (FISH OIL PO) Take 1 capsule by mouth daily.   Yes [provider]  omeprazole (PRILOSEC) 20 MG capsule Take 20 mg by mouth at bedtime.    Yes [provider]  ondansetron (ZOFRAN ODT) 4 MG disintegrating tablet Take 1  tablet (4 mg total) by mouth every 8 (eight) hours as needed. Patient taking differently: Take 4 mg by mouth every 8 (eight) hours as needed for nausea or vomiting.  01/17/17  Yes Marcial Pacas, MD  pravastatin (PRAVACHOL) 80 MG tablet Take 80 mg by mouth at bedtime.   Yes [provider]  PROAIR HFA 108 [phone] Base) MCG/ACT inhaler Inhale 1-2 puffs into the lungs every 6 (six) hours as needed for wheezing or shortness of breath.  12/30/16  Yes [provider]  rizatriptan (MAXALT-MLT) 10 MG disintegrating tablet Take 1 tablet (10 mg total) as needed by mouth. May repeat in 2 hours if needed Patient taking differently: Take 10 mg by mouth as needed for migraine. May repeat in 2 hours if needed 04/20/17  Yes Marcial Pacas, MD    Family History Family History  Problem Relation Age of Onset  . Asthma Mother   . Bipolar disorder Mother   . Heart disease Father   . Lymphoma Father   . Hypertension Father   . Thyroid disease Father   . Hyperlipidemia Father   . Diabetes Father   . Cancer Paternal Grandmother        lung and breast  . Bladder Cancer Paternal Grandfather   . Suicidality Maternal Grandfather   . Thyroid disease Brother     Social History Social History   Tobacco Use  . Smoking status: Former Smoker    Packs/day: 0.75    Years: 20.00    Pack years: 15.00    Types: Cigarettes    Last attempt to quit: 06/07/2012    Years since quitting: 6.0  . Smokeless tobacco: Never Used  Substance Use Topics  . Alcohol use: No  . Drug use: No     Allergies   Ciprofloxacin; Levaquin [levofloxacin]; Buspar [buspirone]; Linaclotide; Advair diskus [fluticasone-salmeterol]; Biaxin  [clarithromycin]; and Hydroxyzine   Review of Systems Review of Systems  Constitutional: Negative for chills and fever.  Respiratory: Negative for chest tightness and shortness of breath.   Cardiovascular: Negative for chest pain.  Gastrointestinal: Positive for abdominal pain (Normal for her when she has hyperglycemia) and nausea. Negative for constipation, diarrhea and vomiting.  Endocrine: Positive for polydipsia and polyuria.  Genitourinary: Negative for dysuria and urgency.  Psychiatric/Behavioral: Positive for behavioral problems, dysphoric mood, hallucinations, self-injury and suicidal ideas.  All other systems reviewed and are negative.    Physical Exam Updated Vital Signs BP (!) 141/83   Pulse 81   Temp 98.5 F (36.9 C)   Resp 18   SpO2 99%   Physical Exam Vitals signs and nursing note reviewed.  Constitutional:      General: She is not in acute distress.    Appearance: She is well-developed. She is not diaphoretic.  HENT:     Head: Normocephalic and atraumatic.  Eyes:     General: No scleral icterus.       Right eye: No discharge.        Left eye: No discharge.     Conjunctiva/sclera: Conjunctivae normal.  Neck:     Musculoskeletal: Normal range of motion.  Cardiovascular:     Rate and Rhythm: Normal rate and regular rhythm.  Pulmonary:     Effort: Pulmonary effort is normal. No respiratory distress.     Breath sounds: No stridor.  Abdominal:     General: Bowel sounds are normal. There is no distension.     Palpations: Abdomen is soft.     Tenderness: There is  no abdominal tenderness.  Musculoskeletal:        General: No deformity.  Skin:    General: Skin is warm and dry.  Neurological:     Mental Status: She is alert.     Motor: No abnormal muscle tone.  Psychiatric:        Attention and Perception: Attention normal.        Mood and Affect: Mood is depressed. Affect is blunt and tearful.        Speech: Speech normal.        Behavior: Behavior  normal.        Thought Content: Thought content includes suicidal ideation. Thought content does not include homicidal ideation. Thought content includes suicidal plan. Thought content does not include homicidal plan.        Judgment: Judgment is impulsive and inappropriate.     Comments: Tearful      ED Treatments / Results  Labs (all labs ordered are listed, but only abnormal results are displayed) Labs Reviewed  COMPREHENSIVE METABOLIC PANEL - Abnormal; Notable for the following components:      Result Value   Chloride 96 (*)    Glucose, Bld 403 (*)    BUN 28 (*)    Creatinine, Ser 2.47 (*)    AST 13 (*)    GFR calc non Af Amer 23 (*)    GFR calc Af Amer 26 (*)    All other components within normal limits  ACETAMINOPHEN LEVEL - Abnormal; Notable for the following components:   Acetaminophen (Tylenol), Serum <10 (*)    All other components within normal limits  RAPID URINE DRUG SCREEN, HOSP PERFORMED - Abnormal; Notable for the following components:   Benzodiazepines POSITIVE (*)    All other components within normal limits  URINALYSIS, ROUTINE W REFLEX MICROSCOPIC - Abnormal; Notable for the following components:   Glucose, UA 150 (*)    Ketones, ur 5 (*)    Protein, ur 100 (*)    Bacteria, UA MANY (*)    All other components within normal limits  I-STAT BETA HCG BLOOD, ED ([facility], [facility], [facility] ONLY) - Abnormal; Notable for the following components:   I-stat hCG, quantitative 9.6 (*)    All other components within normal limits  CBG MONITORING, ED - Abnormal; Notable for the following components:   Glucose-Capillary 392 (*)    All other components within normal limits  CBG MONITORING, ED - Abnormal; Notable for the following components:   Glucose-Capillary 447 (*)    All other components within normal limits  CBG MONITORING, ED - Abnormal; Notable for the following components:   Glucose-Capillary 452 (*)    All other components within normal limits  CBG MONITORING, ED -  Abnormal; Notable for the following components:   Glucose-Capillary 321 (*)    All other components within normal limits  URINE CULTURE  SALICYLATE LEVEL  ETHANOL  CBC WITH DIFFERENTIAL/PLATELET    EKG None  Radiology No results found.  Procedures Procedures (including critical care time)  Medications Ordered in ED Medications  insulin aspart (novoLOG) injection 0-15 Units (has no administration in time range)  insulin aspart (novoLOG) injection 0-5 Units (4 Units Subcutaneous Given 07/08/18 2244)  insulin aspart (novoLOG) injection 3 Units (has no administration in time range)  acetaminophen (TYLENOL) tablet 1,000 mg (has no administration in time range)  aspirin EC tablet 81 mg (has no administration in time range)  buPROPion (WELLBUTRIN XL) 24 hr tablet 300 mg (has no administration in  time range)  carvedilol (COREG) tablet 37.5 mg (has no administration in time range)  cholecalciferol (VITAMIN D3) tablet 1,000 Units (has no administration in time range)  diazepam (VALIUM) tablet 2.5 mg (has no administration in time range)  dicyclomine (BENTYL) capsule 10 mg (has no administration in time range)  diclofenac sodium (VOLTAREN) 1 % transdermal gel 1 application (has no administration in time range)  DULoxetine (CYMBALTA) DR capsule 60 mg (has no administration in time range)  folic acid (FOLVITE) tablet 1 mg (has no administration in time range)  furosemide (LASIX) tablet 40 mg (has no administration in time range)  gabapentin (NEURONTIN) capsule 300 mg (has no administration in time range)  lamoTRIgine (LAMICTAL) tablet 200 mg (has no administration in time range)  levothyroxine (SYNTHROID, LEVOTHROID) tablet 150 mcg (has no administration in time range)  lubiprostone (AMITIZA) capsule 8 mcg (has no administration in time range)  pantoprazole (PROTONIX) EC tablet 40 mg (has no administration in time range)  ondansetron (ZOFRAN-ODT) disintegrating tablet 4 mg (has no  administration in time range)  SUMAtriptan (IMITREX) tablet 50 mg (has no administration in time range)  albuterol (PROVENTIL HFA;VENTOLIN HFA) 108 (90 Base) MCG/ACT inhaler 1-2 puff (has no administration in time range)  pravastatin (PRAVACHOL) tablet 80 mg (has no administration in time range)  sodium chloride 0.9 % bolus 1,000 mL (0 mLs Intravenous Stopped 07/08/18 2042)  insulin aspart (novoLOG) injection 10 Units (10 Units Subcutaneous Given 07/08/18 1952)  sodium chloride 0.9 % bolus 1,000 mL (0 mLs Intravenous Stopped 07/08/18 2225)     Initial Impression / Assessment and Plan / ED Course  I have reviewed the triage vital signs and the nursing notes.  Pertinent labs & imaging results that were available during my care of the patient were reviewed by me and considered in my medical decision making (see chart for details).  Clinical Course as of Jul 08 2245  Sat Jul 08, 2018  1648 Attempted to see patient, not in room.    [EH]  1728 Patient now in room.    [EH]  1816 Consistent with her baseline  Creatinine(!): 2.47 [EH]    Clinical Course User Index [EH] Lorin Glass, PA-C   Patient presents today for evaluation of overdose.  She took 10 mg of Valium when she woke up this morning and 15 mg of Valium at noon.  She has been observed for over 6 hours without worsening mental status.  She initially went to behavioral health where she was evaluated and they recommend inpatient treatment.  She is tearful.  She reports that this was not a suicide attempt, however does report a plan to slit her wrists.    She is a type I diabetic who uses a CGM and a insulin pump.  She recently had the insulin pump become dislodged, she gave herself a correction dose.  Her insulin pump was removed upon being roomed due to concern of SI and using insulin overdose to harm her self.  She still has insulin pump cannulas in place and CGM in place.   On arrival her blood sugar was elevated, initial lab  shows 403.  She had given herself her correction for this 20 minutes prior to checking.  Sugar was rechecked and remained elevated.  She was given 1 L of saline and 10 units of NovoLog, after which sugar was rechecked hour later and was 447.  She was given an additional liter of saline and sugar was rechecked a second hour later  had decreased to 321.  Her creatinine is elevated at 2.47 which is consistent with her baseline.  While she has hyperglycemia she does not have evidence of DKA with CO2 of 27.  5 ketones in her urine however she has also not eaten.  Her urine did show many bacteria with 6-10 squamous epithelial cells therefore sent for culture.  She does not have a significant leukocytosis.  As her pump was removed placed orders for sliding scale insulin with meal coverage and fingerstick blood glucose as needed.  RN contacted poison control who recommended observation from 6 hours post ingestion.  She was observed for approximately 10 hours postingestion while waiting for improvement in her blood sugar without deterioration in mental status.  Her home meds were reordered.  Her abdominal pain improved with sugar improvement.  She is medically clear for psychiatric disposition.  Final Clinical Impressions(s) / ED Diagnoses   Final diagnoses:  Benzodiazepine overdose, intentional self-harm, initial encounter ([place])  Suicidal ideation  Hyperglycemia due to type 1 diabetes mellitus ([place])  CKD (chronic kidney disease), stage IV ([place])  Uncontrolled type 1 diabetes mellitus with hyperglycemia [facility])    ED Discharge Orders    None       Ollen Gross 07/08/18 2248    Lacretia Leigh, MD 07/09/18 1806

## 2018-07-08 NOTE — ED Notes (Addendum)
Spoke with poison control, Alex, regarding pts potential OD on valium. Pt had reported taking 10mg  valium upon waking this AM and another 15 mg valium at 1200.  Provided data collected related to CMP, EKG, acetaminophen and salicylate levels.  PC stated that their protocol would be to observe pt for 6 hours post ingestion for any CNS depression or other effects.  Since pt reported last ingestion of valium at appx 1200 today, PC feels it is acceptable to sign off on her case and that all data is acceptable.

## 2018-07-08 NOTE — ED Triage Notes (Signed)
Transported from [facility] for [place]. Patient states that she took 3 Valium pills around noon. Plan is for her to slit her wrists. Tearful in triage. Only physical complaint is nausea. Patient admits that she did not take the Valium with intention to harm.

## 2018-07-09 DIAGNOSIS — F3181 Bipolar II disorder: Secondary | ICD-10-CM

## 2018-07-09 LAB — URINALYSIS, ROUTINE W REFLEX MICROSCOPIC
Bilirubin Urine: NEGATIVE
Glucose, UA: >=500 mg/dL — AB
Hgb urine dipstick: NEGATIVE
Ketones, ur: 5 mg/dL — AB
Leukocytes, UA: NEGATIVE
Nitrite: NEGATIVE
Protein, ur: NEGATIVE mg/dL
SPECIFIC GRAVITY, URINE: 1.005 (ref 1.005–1.030)
pH: 5 (ref 5.0–8.0)

## 2018-07-09 LAB — CBG MONITORING, ED
Glucose-Capillary: 576 mg/dL (ref 70–99)
Glucose-Capillary: 536 mg/dL (ref 70–99)
Glucose-Capillary: 450 mg/dL — ABNORMAL HIGH (ref 70–99)
Glucose-Capillary: 427 mg/dL — ABNORMAL HIGH (ref 70–99)
Glucose-Capillary: 291 mg/dL — ABNORMAL HIGH (ref 70–99)
Glucose-Capillary: 363 mg/dL — ABNORMAL HIGH (ref 70–99)

## 2018-07-09 LAB — TSH: TSH: 1.358 u[IU]/mL (ref 0.350–4.500)

## 2018-07-09 LAB — PREGNANCY, URINE: Preg Test, Ur: NEGATIVE

## 2018-07-09 MED ORDER — INSULIN GLARGINE 100 UNIT/ML ~~LOC~~ SOLN
50.0000 [IU] | SUBCUTANEOUS | Status: DC
  Administered 2018-07-09: 50 [IU] via SUBCUTANEOUS
  Filled 2018-07-09 (×2): qty 0.5

## 2018-07-09 MED ORDER — INSULIN REGULAR(HUMAN) IN NACL 100-0.9 UT/100ML-% IV SOLN
INTRAVENOUS | Status: DC
  Filled 2018-07-09: qty 100

## 2018-07-09 MED ORDER — INSULIN ASPART 100 UNIT/ML ~~LOC~~ SOLN
4.0000 [IU] | Freq: Three times a day (TID) | SUBCUTANEOUS | Status: DC
  Administered 2018-07-10 (×2): 4 [IU] via SUBCUTANEOUS
  Filled 2018-07-09 (×2): qty 1

## 2018-07-09 MED ORDER — INSULIN ASPART 100 UNIT/ML ~~LOC~~ SOLN
10.0000 [IU] | Freq: Once | SUBCUTANEOUS | Status: AC
  Administered 2018-07-09: 10 [IU] via SUBCUTANEOUS
  Filled 2018-07-09: qty 1

## 2018-07-09 MED ORDER — DEXTROSE-NACL 5-0.45 % IV SOLN: INTRAVENOUS | Status: DC

## 2018-07-09 MED ORDER — INSULIN GLARGINE 100 UNIT/ML ~~LOC~~ SOLN: 50.0000 [IU] | Freq: Once | SUBCUTANEOUS | Status: DC

## 2018-07-09 MED ORDER — INSULIN ASPART 100 UNIT/ML ~~LOC~~ SOLN
15.0000 [IU] | Freq: Once | SUBCUTANEOUS | Status: AC
  Administered 2018-07-09: 15 [IU] via SUBCUTANEOUS
  Filled 2018-07-09: qty 1

## 2018-07-09 NOTE — ED Notes (Signed)
Pt resting at present, no distress noted, calm & cooperative.  Monitoring for safety, Q 15 min checks in effect. 

## 2018-07-09 NOTE — Progress Notes (Signed)
Inpatient Diabetes Program Recommendations  AACE/ADA: New Consensus Statement on Inpatient Glycemic Control (2015)  Target Ranges:  Prepandial:   less than 140 mg/dL      Peak postprandial:   less than 180 mg/dL (1-2 hours)      Critically ill patients:  140 - 180 mg/dL   Lab Results  Component Value Date   QASTMH 962 (H) 07/09/2018   HGBA1C 9.6 (H) 12/25/2012    Review of Glycemic Control  Diabetes history: DM1 Outpatient Diabetes medications: Insulin pump (settings below) Current orders for Inpatient glycemic control: Novolog 0-15 units tidwc and hs + 3 units tidwc  HgbA1C - 8.3% on 06/06/2018 Pt is Type 1 and needs basal insulin.  Insulin Pump: Basal - 65.5 units Bolus - CF - 1:50    CHO ratio - 1:15   Inpatient Diabetes Program Recommendations:     Add Lantus 50 units Q24H Increase Novolog to 4 units tidwc for meal coverage insulin. Increase if post-prandial blood sugars > 180 mg/dL.  Will follow closely.  Thank you. Lorenda Peck, RD, LDN, CDE Inpatient Diabetes Coordinator [phone]

## 2018-07-09 NOTE — ED Notes (Signed)
Pt alert and oriented. Pt denies any pain. Pt denies si,hi, and avh at this time. Pt pleasant and cooperative. Pt resting quietly in the room. Pt safe, will continue to monitor.

## 2018-07-09 NOTE — ED Notes (Signed)
Diabetes coordinator called to say she had put in a note concerning her recommendations for the patient and wanted the ED physician to look at it.  Dr. Eulis Foster notified.

## 2018-07-09 NOTE — Progress Notes (Signed)
Inpatient Diabetes Program Recommendations  AACE/ADA: New Consensus Statement on Inpatient Glycemic Control (2015)  Target Ranges:  Prepandial:   less than 140 mg/dL      Peak postprandial:   less than 180 mg/dL (1-2 hours)      Critically ill patients:  140 - 180 mg/dL   Lab Results  Component Value Date   GLUCAP 291 (H) 07/09/2018   HGBA1C 9.6 (H) 12/25/2012    Review of Glycemic Control    Inpatient Diabetes Program Recommendations:     Check 2 am blood sugar with pt's hx CKD.  Thank you. Lorenda Peck, RD, LDN, CDE Inpatient Diabetes Coordinator [phone]

## 2018-07-09 NOTE — ED Notes (Signed)
PA made aware of patient's blood sugar.  Patient to be moved from acute unit to main ED room 12.

## 2018-07-09 NOTE — ED Notes (Signed)
Bed: HO64 Expected date:  Expected time:  Means of arrival:  Comments: Room 42

## 2018-07-09 NOTE — ED Notes (Signed)
Patient resting in bed.  Calm and cooperative. Took po medications without difficulty.

## 2018-07-09 NOTE — Consult Note (Addendum)
[facility] Consult   Reason for Consult:  Suicidal ideation Referring Physician:  EDP Patient Identification: Jasmine [name] MRN:  [id number] Principal Diagnosis: Bipolar II disorder (Jasmine [name]) Diagnosis:  Principal Problem:   Bipolar II disorder (Jasmine [name]) Active Problems:   DM (diabetes mellitus), type 1, uncontrolled (Jasmine [name])   Hypothyroidism   Chronic migraine   Total Time spent with patient: 30 minutes  Subjective:   Jasmine [name] is a 48 y.o. female patient admitted with suicidal ideation with a plan to overdose.  HPI:  Pt was seen and chart reviewed with treatment team and Dr Darleene Cleaver. Pt was seen at [facility] as a walk-in yesterday and sent to [facility] for medical clearance. Pt stated she was having suicidal thoughts yesterday and depression. She stated  that she took 15 mg of Valium not as a suicide attempt but that she just wanted to sleep. Pt stated she did attempt to cut her wrist in front of her husband 2 weeks ago because he found another man in her car. Pt has had 2 previous suicide attempts but she is minimizing the event and times that these happened. She is followed by Dr Modesta Messing for her psychiatric care and stated she takes her mental health medications as directed. She stated the trigger for her depression and anxiety is losing her disability in December and since she has not worked in 49 years she can not find work. Pt is a Type 1 insulin dependent diabetic. She wears an insulin pump. Her CBG is over 400 today and the EDP's are coordinating this care. Pt has been accepted to [facility], bed 402-1 and may go once her CBG has been below 350 for 24 hours.   Past Psychiatric History: As above  Risk to Self:   Risk to Others:   Prior Inpatient Therapy:   Prior Outpatient Therapy:    Past Medical History:  Past Medical History:  Diagnosis Date  . Anxiety   . Bipolar disorder (Jasmine [name])   . Chronic fatigue   . CKD (chronic kidney disease), stage II   . Depression   . Diabetes  mellitus   . DKA, type 1 (Jasmine [name]) 11/04/2011  . Elevated cholesterol   . Fibromyalgia   . GERD (gastroesophageal reflux disease)   . Headache   . Hyperlipemia   . Hypertension   . Hypothyroidism   . IBS (irritable bowel syndrome)   . Obesity   . Sleep apnea    HAS C -PAP / DOES NOT USE  . Stress incontinence    Pt had surgery to correct this.  . Tachycardia   . Tobacco abuse   . UTI (lower urinary tract infection)     Past Surgical History:  Procedure Laterality Date  . INCONTINENCE SURGERY    . NASAL FRACTURE SURGERY    . ovary removed    . OVARY SURGERY    . PUBOVAGINAL SLING  08/16/2011   Procedure: [name];  Surgeon: Bernestine Amass, MD;  Location: [facility];  Service: Urology;  Laterality: N/A;         . UTERINE FIBROID SURGERY  2001   Family History:  Family History  Problem Relation Age of Onset  . Asthma Mother   . Bipolar disorder Mother   . Heart disease Father   . Lymphoma Father   . Hypertension Father   . Thyroid disease Father   . Hyperlipidemia Father   . Diabetes Father   . Cancer Paternal Grandmother  lung and breast  . Bladder Cancer Paternal Grandfather   . Suicidality Maternal Grandfather   . Thyroid disease Brother    Family Psychiatric  History: Pt did not give this information today Social History:  Social History   Substance and Sexual Activity  Alcohol Use No     Social History   Substance and Sexual Activity  Drug Use No    Social History   Socioeconomic History  . Marital status: Married    Spouse name: Not on file  . Number of children: 0  . Years of education: 68  . Highest education level: Not on file  Occupational History  . Occupation: Disabled  Social Needs  . Financial resource strain: Not on file  . Food insecurity:    Worry: Not on file    Inability: Not on file  . Transportation needs:    Medical: Not on file    Non-medical: Not on file  Tobacco Use  . Smoking status: Former Smoker     Packs/day: 0.75    Years: 20.00    Pack years: 15.00    Types: Cigarettes    Last attempt to quit: 06/07/2012    Years since quitting: 6.0  . Smokeless tobacco: Never Used  Substance and Sexual Activity  . Alcohol use: No  . Drug use: No  . Sexual activity: Yes    Birth control/protection: I.U.D.  Lifestyle  . Physical activity:    Days per week: Not on file    Minutes per session: Not on file  . Stress: Not on file  Relationships  . Social connections:    Talks on phone: Not on file    Gets together: Not on file    Attends religious service: Not on file    Active member of club or organization: Not on file    Attends meetings of clubs or organizations: Not on file    Relationship status: Not on file  Other Topics Concern  . Not on file  Social History Narrative   Lives at home with husband.   Right-handed.   Occasional caffeine use.   Additional Social History:    Allergies:   Allergies  Allergen Reactions  . Ciprofloxacin Swelling and Other (See Comments)    Per pt caused lips swell and nauseous feeling  . Levaquin [Levofloxacin] Swelling and Other (See Comments)    Per pt caused lips swell and nauseous feeling  . Buspar [Buspirone] Other (See Comments)    abd cramping  . Linaclotide Other (See Comments)  . Advair Diskus [Fluticasone-Salmeterol] Other (See Comments)    Thrush   . Biaxin [Clarithromycin] Rash  . Hydroxyzine Palpitations    Labs:  Results for orders placed or performed during the hospital encounter of 07/08/18 (from the past 48 hour(s))  Urine rapid drug screen (hosp performed)     Status: Abnormal   Collection Time: 07/08/18  4:43 PM  Result Value Ref Range   Opiates NONE DETECTED NONE DETECTED   Cocaine NONE DETECTED NONE DETECTED   Benzodiazepines POSITIVE (A) NONE DETECTED   Amphetamines NONE DETECTED NONE DETECTED   Tetrahydrocannabinol NONE DETECTED NONE DETECTED   Barbiturates NONE DETECTED NONE DETECTED    Comment: (NOTE) DRUG  SCREEN FOR MEDICAL PURPOSES ONLY.  IF CONFIRMATION IS NEEDED FOR ANY PURPOSE, NOTIFY LAB WITHIN 5 DAYS. LOWEST DETECTABLE LIMITS FOR URINE DRUG SCREEN Drug Class  Cutoff (ng/mL) Amphetamine and metabolites    1000 Barbiturate and metabolites    200 Benzodiazepine                 812 Tricyclics and metabolites     300 Opiates and metabolites        300 Cocaine and metabolites        300 THC                            50 Performed at [facility], [place] [address] [place], [place] [address]   Comprehensive metabolic panel     Status: Abnormal   Collection Time: 07/08/18  5:26 PM  Result Value Ref Range   Sodium 135 135 - 145 mmol/L   Potassium 4.2 3.5 - 5.1 mmol/L   Chloride 96 (L) 98 - 111 mmol/L   CO2 27 22 - 32 mmol/L   Glucose, Bld 403 (H) 70 - 99 mg/dL   BUN 28 (H) 6 - 20 mg/dL   Creatinine, Ser 2.47 (H) 0.44 - 1.00 mg/dL   Calcium 9.1 8.9 - 10.3 mg/dL   Total Protein 7.3 6.5 - 8.1 g/dL   Albumin 3.7 3.5 - 5.0 g/dL   AST 13 (L) 15 - 41 U/L   ALT 12 0 - 44 U/L   Alkaline Phosphatase 99 38 - 126 U/L   Total Bilirubin 0.9 0.3 - 1.2 mg/dL   GFR calc non Af Amer 23 (L) >60 mL/min   GFR calc Af Amer 26 (L) >60 mL/min   Anion gap 12 5 - 15    Comment: Performed at [facility], [place] [address], [place], [place] [address]  Salicylate level     Status: None   Collection Time: 07/08/18  5:26 PM  Result Value Ref Range   Salicylate Lvl <4.4 2.8 - 30.0 mg/dL    Comment: Performed at [facility], [place] [address] [place], [place] [address]  Acetaminophen level     Status: Abnormal   Collection Time: 07/08/18  5:26 PM  Result Value Ref Range   Acetaminophen (Tylenol), Serum <10 (L) 10 - 30 ug/mL    Comment: (NOTE) Therapeutic concentrations vary significantly. A range of 10-30 ug/mL  may be an effective concentration for many patients. However, some  are best treated at concentrations outside of this  range. Acetaminophen concentrations >150 ug/mL at 4 hours after ingestion  and >50 ug/mL at 12 hours after ingestion are often associated with  toxic reactions. Performed at [facility], [place] [address] [place], [place] [address]   Ethanol     Status: None   Collection Time: 07/08/18  5:26 PM  Result Value Ref Range   Alcohol, Ethyl (B) <10 <10 mg/dL    Comment: (NOTE) Lowest detectable limit for serum alcohol is 10 mg/dL. For medical purposes only. Performed at [facility], [place] [address] [place],  [address]   CBC WITH DIFFERENTIAL     Status: None   Collection Time: 07/08/18  5:26 PM  Result Value Ref Range   WBC 7.7 4.0 - 10.5 K/uL   RBC 4.56 3.87 - 5.11 MIL/uL   Hemoglobin 12.8 12.0 - 15.0 g/dL   HCT 41.9 36.0 - 46.0 %   MCV 91.9 80.0 - 100.0 fL   MCH 28.1 26.0 - 34.0 pg   MCHC 30.5 30.0 - 36.0 g/dL   RDW 14.6 11.5 -  15.5 %   Platelets 257 150 - 400 K/uL   nRBC 0.0 0.0 - 0.2 %   Neutrophils Relative % 67 %   Neutro Abs 5.1 1.7 - 7.7 K/uL   Lymphocytes Relative 22 %   Lymphs Abs 1.7 0.7 - 4.0 K/uL   Monocytes Relative 7 %   Monocytes Absolute 0.6 0.1 - 1.0 K/uL   Eosinophils Relative 3 %   Eosinophils Absolute 0.2 0.0 - 0.5 K/uL   Basophils Relative 1 %   Basophils Absolute 0.1 0.0 - 0.1 K/uL   Immature Granulocytes 0 %   Abs Immature Granulocytes 0.02 0.00 - 0.07 K/uL    Comment: Performed at [facility], [place] [address] [place], [place] [address]  I-Stat beta hCG blood, ED     Status: Abnormal   Collection Time: 07/08/18  5:30 PM  Result Value Ref Range   I-stat hCG, quantitative 9.6 (H) <5 mIU/mL   Comment 3            Comment:   GEST. AGE      CONC.  (mIU/mL)   <=1 WEEK        5 - 50     2 WEEKS       50 - 500     3 WEEKS       100 - 10,000     4 WEEKS     1,000 - 30,000        FEMALE AND NON-PREGNANT FEMALE:     LESS THAN 5 mIU/mL   CBG monitoring, ED     Status: Abnormal   Collection  Time: 07/08/18  5:38 PM  Result Value Ref Range   Glucose-Capillary 392 (H) 70 - 99 mg/dL  Urinalysis, Routine w reflex microscopic     Status: Abnormal   Collection Time: 07/08/18  5:44 PM  Result Value Ref Range   Color, Urine YELLOW YELLOW   APPearance CLEAR CLEAR   Specific Gravity, Urine 1.009 1.005 - 1.030   pH 6.0 5.0 - 8.0   Glucose, UA 150 (A) NEGATIVE mg/dL   Hgb urine dipstick NEGATIVE NEGATIVE   Bilirubin Urine NEGATIVE NEGATIVE   Ketones, ur 5 (A) NEGATIVE mg/dL   Protein, ur 100 (A) NEGATIVE mg/dL   Nitrite NEGATIVE NEGATIVE   Leukocytes, UA NEGATIVE NEGATIVE   WBC, UA 0-5 0 - 5 WBC/hpf   Bacteria, UA MANY (A) NONE SEEN   Squamous Epithelial / LPF 6-10 0 - 5    Comment: Performed at [facility], [place] [address], [place], [place] [address]  CBG monitoring, ED     Status: Abnormal   Collection Time: 07/08/18  7:05 PM  Result Value Ref Range   Glucose-Capillary 447 (H) 70 - 99 mg/dL  CBG monitoring, ED     Status: Abnormal   Collection Time: 07/08/18  8:44 PM  Result Value Ref Range   Glucose-Capillary 452 (H) 70 - 99 mg/dL   Comment 1 Notify RN    Comment 2 Document in Chart   CBG monitoring, ED     Status: Abnormal   Collection Time: 07/08/18 10:30 PM  Result Value Ref Range   Glucose-Capillary 321 (H) 70 - 99 mg/dL  CBG monitoring, ED     Status: Abnormal   Collection Time: 07/09/18  7:13 AM  Result Value Ref Range   Glucose-Capillary 576 (HH) 70 - 99 mg/dL  CBG monitoring, ED     Status: Abnormal   Collection Time: 07/09/18  7:49 AM  Result Value Ref Range   Glucose-Capillary 536 (HH) 70 - 99 mg/dL  CBG monitoring, ED     Status: Abnormal   Collection Time: 07/09/18  8:49 AM  Result Value Ref Range   Glucose-Capillary 450 (H) 70 - 99 mg/dL  CBG monitoring, ED     Status: Abnormal   Collection Time: 07/09/18 11:31 AM  Result Value Ref Range   Glucose-Capillary 427 (H) 70 - 99 mg/dL    Current Facility-Administered Medications   Medication Dose Route Frequency Provider Last Rate Last Dose  . acetaminophen (TYLENOL) tablet 1,000 mg  1,000 mg Oral Q6H PRN Lorin Glass, PA-C      . albuterol (PROVENTIL HFA;VENTOLIN HFA) 108 (90 Base) MCG/ACT inhaler 1-2 puff  1-2 puff Inhalation Q6H PRN Lorin Glass, PA-C      . aspirin EC tablet 81 mg  81 mg Oral QHS Lorin Glass, [place]   81 mg at 07/08/18 2253  . buPROPion (WELLBUTRIN XL) 24 hr tablet 300 mg  300 mg Oral Daily Lorin Glass, PA-C   300 mg at 07/08/18 2303  . carvedilol (COREG) tablet 37.5 mg  37.5 mg Oral BID WC Lorin Glass, PA-C   37.5 mg at 07/09/18 7322  . cholecalciferol (VITAMIN D3) tablet 1,000 Units  1,000 Units Oral Daily Lorin Glass, PA-C   1,000 Units at 07/09/18 0254  . diazepam (VALIUM) tablet 2.5 mg  2.5 mg Oral Daily PRN Lorin Glass, PA-C      . diclofenac sodium (VOLTAREN) 1 % transdermal gel 1 application  1 application Topical Daily PRN Lorin Glass, PA-C      . dicyclomine (BENTYL) capsule 10 mg  10 mg Oral TID PRN Lorin Glass, PA-C      . DULoxetine (CYMBALTA) DR capsule 60 mg  60 mg Oral BID Lorin Glass, PA-C   60 mg at 07/09/18 2706  . folic acid (FOLVITE) tablet 1 mg  1,000 mcg Oral QPM Lorin Glass, PA-C   1 mg at 07/08/18 2254  . furosemide (LASIX) tablet 40 mg  40 mg Oral Daily Lorin Glass, [place]   40 mg at 07/09/18 2376  . gabapentin (NEURONTIN) capsule 300 mg  300 mg Oral QHS Lorin Glass, PA-C   300 mg at 07/08/18 2253  . insulin aspart (novoLOG) injection 0-15 Units  0-15 Units Subcutaneous TID WC Lorin Glass, PA-C      . insulin aspart (novoLOG) injection 0-5 Units  0-5 Units Subcutaneous QHS Lorin Glass, [place]   4 Units at 07/08/18 2244  . insulin aspart (novoLOG) injection 3 Units  3 Units Subcutaneous TID WC Lorin Glass, PA-C   3 Units at 07/09/18 1151  . lamoTRIgine (LAMICTAL) tablet 200 mg  200 mg Oral Daily  Lorin Glass, PA-C   200 mg at 07/08/18 2257  . levothyroxine (SYNTHROID, LEVOTHROID) tablet 150 mcg  150 mcg Oral Q0600 Lorin Glass, PA-C   150 mcg at 07/09/18 2831  . lubiprostone (AMITIZA) capsule 8 mcg  8 mcg Oral PRN Lorin Glass, PA-C      . ondansetron (ZOFRAN-ODT) disintegrating tablet 4 mg  4 mg Oral Q8H PRN Lorin Glass, PA-C   4 mg at 07/09/18 0747  . pantoprazole (PROTONIX) EC tablet 40 mg  40 mg Oral Daily Lorin Glass, [place]   40 mg at 07/09/18 5176  . pravastatin (PRAVACHOL) tablet 80 mg  80 mg  Oral QHS Lorin Glass, PA-C   80 mg at 07/08/18 2253  . SUMAtriptan (IMITREX) tablet 50 mg  50 mg Oral PRN Lorin Glass, PA-C       Current Outpatient Medications  Medication Sig Dispense Refill  . acetaminophen (TYLENOL) 500 MG tablet Take 1,000 mg by mouth every 6 (six) hours as needed for moderate pain or headache.     Marland Kitchen aspirin EC 81 MG tablet Take 81 mg by mouth at bedtime.     Marland Kitchen buPROPion (WELLBUTRIN XL) 300 MG 24 hr tablet Take 1 tablet (300 mg total) by mouth daily. 90 tablet 0  . carvedilol (COREG) 25 MG tablet Take 37.5 mg by mouth 2 (two) times daily with a meal.     . cholecalciferol (VITAMIN D) 1000 UNITS tablet Take 1,000 Units by mouth daily.    . diazepam (VALIUM) 5 MG tablet Take 0.5 tablets (2.5 mg total) by mouth daily as needed for anxiety or muscle spasms. 30 tablet 0  . Diclofenac Sodium (PENNSAID) 2 % SOLN Place 1 application onto the skin daily as needed (for back pain).    Marland Kitchen dicyclomine (BENTYL) 10 MG capsule Take 10 mg by mouth 3 (three) times daily as needed for spasms.    . DULoxetine (CYMBALTA) 60 MG capsule Take 1 capsule (60 mg total) by mouth 2 (two) times daily. 180 capsule 0  . fluticasone (FLONASE) 50 MCG/ACT nasal spray Place 2 sprays into the nose daily as needed for allergies.     . folic acid (FOLVITE) 841 MCG tablet Take 400 mcg by mouth every evening.     . furosemide (LASIX) 40 MG tablet Take  40 mg by mouth daily.     Marland Kitchen gabapentin (NEURONTIN) 300 MG capsule Take 300 mg by mouth at bedtime.     . lamoTRIgine (LAMICTAL) 200 MG tablet Take 1 tablet (200 mg total) by mouth daily. 90 tablet 0  . levothyroxine (SYNTHROID, LEVOTHROID) 150 MCG tablet Take 150 mcg by mouth daily before breakfast.    . lubiprostone (AMITIZA) 8 MCG capsule Take 8 mcg by mouth as needed for constipation.     Marland Kitchen NOVOLOG 100 UNIT/ML injection Inject 2.425-2.95 Units into the skin daily. Via pump 7pm to midnight 2.950 8:30am -7pm2.925 Midnight - 8:30 2.425  2  . Omega-3 Fatty Acids (FISH OIL PO) Take 1 capsule by mouth daily.    Marland Kitchen omeprazole (PRILOSEC) 20 MG capsule Take 20 mg by mouth at bedtime.     . ondansetron (ZOFRAN ODT) 4 MG disintegrating tablet Take 1 tablet (4 mg total) by mouth every 8 (eight) hours as needed. (Patient taking differently: Take 4 mg by mouth every 8 (eight) hours as needed for nausea or vomiting. ) 20 tablet 6  . pravastatin (PRAVACHOL) 80 MG tablet Take 80 mg by mouth at bedtime.    Marland Kitchen PROAIR HFA 108 (90 Base) MCG/ACT inhaler Inhale 1-2 puffs into the lungs every 6 (six) hours as needed for wheezing or shortness of breath.   1  . rizatriptan (MAXALT-MLT) 10 MG disintegrating tablet Take 1 tablet (10 mg total) as needed by mouth. May repeat in 2 hours if needed (Patient taking differently: Take 10 mg by mouth as needed for migraine. May repeat in 2 hours if needed) 15 tablet 11    Musculoskeletal: Strength & Muscle Tone: within normal limits Gait & Station: normal Patient leans: N/A  Psychiatric Specialty Exam: Physical Exam  Constitutional: She is oriented to person, place, and time.  She appears well-developed and well-nourished.  HENT:  Head: Normocephalic.  Respiratory: Effort normal.  Musculoskeletal: Normal range of motion.  Neurological: She is alert and oriented to person, place, and time.    Review of Systems  Psychiatric/Behavioral: Positive for depression and suicidal  ideas.  All other systems reviewed and are negative.   Blood pressure (!) 119/59, pulse 73, temperature 98 F (36.7 C), temperature source Oral, resp. rate 17, SpO2 97 %.There is no height or weight on file to calculate BMI.  General Appearance: Casual  Eye Contact:  Fair  Speech:  Clear and Coherent and Normal Rate  Volume:  Decreased  Mood:  Depressed, Dysphoric and Hopeless  Affect:  Congruent, Depressed and Flat  Thought Process:  Coherent and Descriptions of Associations: Intact  Orientation:  Full (Time, Place, and Person)  Thought Content:  Logical  Suicidal Thoughts:  No  Homicidal Thoughts:  No  Memory:  Immediate;   Good Recent;   Good Remote;   Fair  Judgement:  Poor  Insight:  Shallow  Psychomotor Activity:  Decreased  Concentration:  Concentration: Good and Attention Span: Good  Recall:  Good  Fund of Knowledge:  Good  Language:  Good  Akathisia:  No  Handed:  Right  AIMS (if indicated):     Assets:  Agricultural consultant Housing  ADL's:  Intact  Cognition:  WNL  Sleep:        Treatment Plan Summary: Daily contact with patient to assess and evaluate symptoms and progress in treatment and Medication management  Crisis Stabilization Medication management: Continue Cymbalta 60 mg daily for depression Continue Wellbutrin XL 300 mg daily mood stabilization Continue Lamictal 200 mg daily for mood stabilization Continue Imitrex 50 mg as needed for migraine Continue medications for diabetes, hypothyroidism, hypertension and hyperlipidemia as ordered  Disposition: Recommend psychiatric Inpatient admission when medically cleared. pt accepted to [facility], bed 402-1, may go once CBG remains under 350 for 24 hours.   Ethelene Hal, NP 07/09/2018 12:52 PM  Patient seen face-to-face for psychiatric evaluation, chart reviewed and case discussed with the physician extender and developed treatment plan. Reviewed the information documented  and agree with the treatment plan. Corena Pilgrim, MD

## 2018-07-09 NOTE — ED Notes (Addendum)
Pt remains in scrubs, wanded by security, personal belongings removed from cabinet and transported to the acute care area with the patient. Pt has cam walker on RLE and pair of glasses. Report given to receiving RN.

## 2018-07-09 NOTE — ED Provider Notes (Signed)
Patient placed in psychiatric holding after intentional overdose. She is a Type 1 diabetic, usually on insulin pump which became dislodged last night and discontinued due to concern for suicidal attempt.   Recheck of her CBG this morning reads over 500. Dose placed by previous treatment team includes 3 U at meals plus sliding scale, which does not go as high as 500. Nursing asking about management.   Discussed with pharmacy who recommends glucostabilizer and consult by Diabetes Coordinator for further insulin dosing.   It was felt the glucostabilizer felt a risk due to suicidal ideation and possibility of manipulation by the patient causing insulin overdose. 10 U SQ given now. Will wait for diabetes coordinator's recommendations.    Charlann Lange, PA-C 07/09/18 0102    Lacretia Leigh, MD 07/09/18 1806

## 2018-07-09 NOTE — ED Notes (Signed)
Per [place], [place]. Hold off on insulin aspart for 45 min. Then recheck sugar. Patient advised to wait to eat breakfast.

## 2018-07-09 NOTE — ED Provider Notes (Signed)
Orders for insulin changes placed per diabetes coordinator note.    Mizael Sagar, Corene Cornea, MD 07/09/18 [id number]

## 2018-07-09 NOTE — ED Notes (Signed)
Patient taking a shower.

## 2018-07-10 ENCOUNTER — Inpatient Hospital Stay (HOSPITAL_COMMUNITY)
Admission: AD | Admit: 2018-07-10 | Discharge: 2018-07-13 | DRG: 885 | Disposition: A | Discharge status: Home / Self Care | Payer: BLUE CROSS/BLUE SHIELD | Source: Intra-hospital | Attending: Emergency Medicine | Admitting: Emergency Medicine

## 2018-07-10 ENCOUNTER — Encounter (HOSPITAL_COMMUNITY): Payer: Self-pay

## 2018-07-10 ENCOUNTER — Other Ambulatory Visit: Payer: Self-pay

## 2018-07-10 DIAGNOSIS — Z79890 Hormone replacement therapy (postmenopausal): Secondary | ICD-10-CM

## 2018-07-10 DIAGNOSIS — Z818 Family history of other mental and behavioral disorders: Secondary | ICD-10-CM | POA: Diagnosis not present

## 2018-07-10 DIAGNOSIS — Z87891 Personal history of nicotine dependence: Secondary | ICD-10-CM

## 2018-07-10 DIAGNOSIS — E1022 Type 1 diabetes mellitus with diabetic chronic kidney disease: Secondary | ICD-10-CM | POA: Diagnosis present

## 2018-07-10 DIAGNOSIS — K219 Gastro-esophageal reflux disease without esophagitis: Secondary | ICD-10-CM | POA: Diagnosis present

## 2018-07-10 DIAGNOSIS — Z915 Personal history of self-harm: Secondary | ICD-10-CM | POA: Diagnosis not present

## 2018-07-10 DIAGNOSIS — N182 Chronic kidney disease, stage 2 (mild): Secondary | ICD-10-CM | POA: Diagnosis present

## 2018-07-10 DIAGNOSIS — F3181 Bipolar II disorder: Secondary | ICD-10-CM | POA: Diagnosis present

## 2018-07-10 DIAGNOSIS — R45851 Suicidal ideations: Secondary | ICD-10-CM | POA: Diagnosis present

## 2018-07-10 DIAGNOSIS — E162 Hypoglycemia, unspecified: Secondary | ICD-10-CM

## 2018-07-10 DIAGNOSIS — Z794 Long term (current) use of insulin: Secondary | ICD-10-CM | POA: Diagnosis not present

## 2018-07-10 DIAGNOSIS — Z7982 Long term (current) use of aspirin: Secondary | ICD-10-CM | POA: Diagnosis not present

## 2018-07-10 DIAGNOSIS — F333 Major depressive disorder, recurrent, severe with psychotic symptoms: Secondary | ICD-10-CM | POA: Diagnosis present

## 2018-07-10 DIAGNOSIS — I959 Hypotension, unspecified: Secondary | ICD-10-CM | POA: Diagnosis not present

## 2018-07-10 DIAGNOSIS — Z9641 Presence of insulin pump (external) (internal): Secondary | ICD-10-CM | POA: Diagnosis present

## 2018-07-10 DIAGNOSIS — E1065 Type 1 diabetes mellitus with hyperglycemia: Secondary | ICD-10-CM | POA: Diagnosis not present

## 2018-07-10 LAB — URINE CULTURE: Culture: 40000 — AB

## 2018-07-10 LAB — CBG MONITORING, ED
Glucose-Capillary: 228 mg/dL — ABNORMAL HIGH (ref 70–99)
Glucose-Capillary: 225 mg/dL — ABNORMAL HIGH (ref 70–99)

## 2018-07-10 LAB — GLUCOSE, CAPILLARY
GLUCOSE-CAPILLARY: 258 mg/dL — AB (ref 70–99)
Glucose-Capillary: 223 mg/dL — ABNORMAL HIGH (ref 70–99)

## 2018-07-10 MED ORDER — INSULIN GLARGINE 100 UNIT/ML ~~LOC~~ SOLN
50.0000 [IU] | SUBCUTANEOUS | Status: DC
  Administered 2018-07-10: 50 [IU] via SUBCUTANEOUS

## 2018-07-10 MED ORDER — BUPROPION HCL ER (XL) 300 MG PO TB24
300.0000 mg | ORAL_TABLET | Freq: Every day | ORAL | Status: DC
  Administered 2018-07-10 – 2018-07-12 (×3): 300 mg via ORAL
  Filled 2018-07-10 (×7): qty 1

## 2018-07-10 MED ORDER — CARVEDILOL 25 MG PO TABS
37.5000 mg | ORAL_TABLET | Freq: Two times a day (BID) | ORAL | Status: DC
  Administered 2018-07-10 – 2018-07-11 (×2): 37.5 mg via ORAL
  Filled 2018-07-10 (×3): qty 1

## 2018-07-10 MED ORDER — ALUM & MAG HYDROXIDE-SIMETH 200-200-20 MG/5ML PO SUSP: 30.0000 mL | ORAL | Status: DC | PRN

## 2018-07-10 MED ORDER — DICYCLOMINE HCL 10 MG PO CAPS: 10.0000 mg | ORAL_CAPSULE | Freq: Three times a day (TID) | ORAL | Status: DC | PRN

## 2018-07-10 MED ORDER — PRAVASTATIN SODIUM 80 MG PO TABS
80.0000 mg | ORAL_TABLET | Freq: Every day | ORAL | Status: DC
  Administered 2018-07-11 – 2018-07-12 (×2): 80 mg via ORAL
  Filled 2018-07-10 (×5): qty 1

## 2018-07-10 MED ORDER — LUBIPROSTONE 8 MCG PO CAPS
8.0000 ug | ORAL_CAPSULE | ORAL | Status: DC | PRN
  Filled 2018-07-10: qty 1

## 2018-07-10 MED ORDER — VITAMIN D 25 MCG (1000 UNIT) PO TABS
1000.0000 [IU] | ORAL_TABLET | Freq: Every day | ORAL | Status: DC
  Administered 2018-07-10 – 2018-07-13 (×4): 1000 [IU] via ORAL
  Filled 2018-07-10 (×8): qty 1

## 2018-07-10 MED ORDER — MAGNESIUM HYDROXIDE 400 MG/5ML PO SUSP: 30.0000 mL | Freq: Every day | ORAL | Status: DC | PRN

## 2018-07-10 MED ORDER — INSULIN ASPART 100 UNIT/ML ~~LOC~~ SOLN
4.0000 [IU] | Freq: Three times a day (TID) | SUBCUTANEOUS | Status: DC
  Administered 2018-07-10: 4 [IU] via SUBCUTANEOUS

## 2018-07-10 MED ORDER — DICLOFENAC SODIUM 1 % TD GEL
1.0000 "application " | Freq: Every day | TRANSDERMAL | Status: DC | PRN
  Filled 2018-07-10: qty 100

## 2018-07-10 MED ORDER — ONDANSETRON 4 MG PO TBDP: 4.0000 mg | ORAL_TABLET | Freq: Three times a day (TID) | ORAL | Status: DC | PRN

## 2018-07-10 MED ORDER — GABAPENTIN 300 MG PO CAPS
300.0000 mg | ORAL_CAPSULE | Freq: Every day | ORAL | Status: DC
  Administered 2018-07-10 – 2018-07-12 (×3): 300 mg via ORAL
  Filled 2018-07-10 (×6): qty 1

## 2018-07-10 MED ORDER — TRAZODONE HCL 50 MG PO TABS
50.0000 mg | ORAL_TABLET | Freq: Every evening | ORAL | Status: DC | PRN
  Administered 2018-07-10 – 2018-07-11 (×2): 50 mg via ORAL
  Filled 2018-07-10: qty 1

## 2018-07-10 MED ORDER — LEVOTHYROXINE SODIUM 150 MCG PO TABS
150.0000 ug | ORAL_TABLET | Freq: Every day | ORAL | Status: DC
  Administered 2018-07-11 – 2018-07-13 (×3): 150 ug via ORAL
  Filled 2018-07-10: qty 2
  Filled 2018-07-10: qty 1
  Filled 2018-07-10: qty 2
  Filled 2018-07-10 (×4): qty 1

## 2018-07-10 MED ORDER — ASPIRIN EC 81 MG PO TBEC
81.0000 mg | DELAYED_RELEASE_TABLET | Freq: Every day | ORAL | Status: DC
  Administered 2018-07-10 – 2018-07-12 (×3): 81 mg via ORAL
  Filled 2018-07-10 (×6): qty 1

## 2018-07-10 MED ORDER — PANTOPRAZOLE SODIUM 40 MG PO TBEC
40.0000 mg | DELAYED_RELEASE_TABLET | Freq: Every day | ORAL | Status: DC
  Administered 2018-07-10 – 2018-07-13 (×4): 40 mg via ORAL
  Filled 2018-07-10 (×9): qty 1

## 2018-07-10 MED ORDER — TRAZODONE HCL 50 MG PO TABS: 50.0000 mg | ORAL_TABLET | Freq: Every evening | ORAL | Status: DC | PRN

## 2018-07-10 MED ORDER — INSULIN ASPART 100 UNIT/ML ~~LOC~~ SOLN
0.0000 [IU] | Freq: Three times a day (TID) | SUBCUTANEOUS | Status: DC
  Administered 2018-07-10: 5 [IU] via SUBCUTANEOUS

## 2018-07-10 MED ORDER — ACETAMINOPHEN 325 MG PO TABS: 650.0000 mg | ORAL_TABLET | Freq: Four times a day (QID) | ORAL | Status: DC | PRN

## 2018-07-10 MED ORDER — ALBUTEROL SULFATE HFA 108 (90 BASE) MCG/ACT IN AERS
1.0000 | INHALATION_SPRAY | Freq: Four times a day (QID) | RESPIRATORY_TRACT | Status: DC | PRN
  Administered 2018-07-10: 2 via RESPIRATORY_TRACT
  Filled 2018-07-10: qty 6.7

## 2018-07-10 MED ORDER — DULOXETINE HCL 60 MG PO CPEP
60.0000 mg | ORAL_CAPSULE | Freq: Two times a day (BID) | ORAL | Status: DC
  Administered 2018-07-10 – 2018-07-12 (×4): 60 mg via ORAL
  Filled 2018-07-10 (×4): qty 1
  Filled 2018-07-10: qty 2
  Filled 2018-07-10 (×4): qty 1

## 2018-07-10 MED ORDER — INSULIN ASPART 100 UNIT/ML ~~LOC~~ SOLN
0.0000 [IU] | Freq: Every day | SUBCUTANEOUS | Status: DC
  Administered 2018-07-10: 3 [IU] via SUBCUTANEOUS

## 2018-07-10 MED ORDER — LAMOTRIGINE 100 MG PO TABS
200.0000 mg | ORAL_TABLET | Freq: Every day | ORAL | Status: DC
  Administered 2018-07-10 – 2018-07-12 (×3): 200 mg via ORAL
  Filled 2018-07-10 (×6): qty 2

## 2018-07-10 MED ORDER — FOLIC ACID 1 MG PO TABS
1000.0000 ug | ORAL_TABLET | Freq: Every evening | ORAL | Status: DC
  Administered 2018-07-10 – 2018-07-12 (×3): 1 mg via ORAL
  Filled 2018-07-10 (×7): qty 1

## 2018-07-10 MED ORDER — SUMATRIPTAN SUCCINATE 50 MG PO TABS
50.0000 mg | ORAL_TABLET | ORAL | Status: DC | PRN
  Filled 2018-07-10: qty 1

## 2018-07-10 MED ORDER — FUROSEMIDE 40 MG PO TABS
40.0000 mg | ORAL_TABLET | Freq: Every day | ORAL | Status: DC
  Administered 2018-07-10 – 2018-07-11 (×2): 40 mg via ORAL
  Filled 2018-07-10 (×4): qty 1
  Filled 2018-07-10: qty 2

## 2018-07-10 MED ORDER — CARVEDILOL 12.5 MG PO TABS
ORAL_TABLET | ORAL | Status: AC
  Filled 2018-07-10: qty 3

## 2018-07-10 NOTE — Progress Notes (Signed)
Admission Note  Pt is a 48 yo female that presents voluntarily on 07/10/2018 with worsening depression and suicidal thoughts to slit her wrists. Pt has hx of cutting but has in some time. Pt is wearing a hard brace on her R leg with a fracture/break in her foot. Pt is stated as being on weight bearing restrictions. Pt was provided a wheel chair for transport to the dining area. Pt states that she lost her disability and is in the process of losing her two new cars and possibly her house. Pt is tearful when talking about this. Pt states her marriage of 5 years is rocky right now. Pt denies any alcohol/tobacco/drug/Rx abuse. Pt has a hx of HT and DM II. Pt denies any verbal/physical/sexual abuse either past/present. Pt states she doesn't get angry often, but enjoys being alone if she is. Pt states her father is her biggest support system. Pt denies si/hi/ah/vh and verbally agrees to approach staff if these become apparent or before harming herself or others while at [facility].   Consents signed, skin/belongings search completed and patient oriented to unit. Patient stable at this time. Patient given the opportunity to express concerns and ask questions. Patient given toiletries. Will continue to monitor.

## 2018-07-10 NOTE — Progress Notes (Signed)
Patient will need orders for the following (was getting Lantus, Novolog SSI, and Novolog Meal Coverage while waiting for placement in the ED over at [facility]):  1. Lantus 50 units Daily (please give around 6pm today as pt started on Lantus around 6pm yesterday 02/02)  2. Novolog Sensitive Correction Scale/ SSI (0-9 units) TID AC + HS  3. Novolog 4 units TID with meals  (Please add the following Hold Parameters: Hold if pt eats <50% of meal, Hold if pt NPO)    --Will follow patient during hospitalization--  Wyn Quaker RN, MSN, CDE Diabetes Coordinator Inpatient Glycemic Control Team Team Pager: [phone] (8a-5p)

## 2018-07-10 NOTE — Progress Notes (Signed)
D  Pt endorses depression and sadness   She is pleasant on approach and cooperative    She is visible on the milieu and her behavior is appropriate A   Verbal support given    Medications administered and effectiveness monitored    Q 15 min checks R   Pt is safe at this time and receptive to verbal support

## 2018-07-10 NOTE — ED Notes (Signed)
Pt noted with a plastic foot boot at the bedside.

## 2018-07-10 NOTE — BH Assessment (Signed)
[facility] Assessment Progress Note 07/10/18: Per Jyl Heinz, [place] DNP patient continues to meet inpatient criteria as appropriate bed placement is investigated.

## 2018-07-10 NOTE — ED Notes (Signed)
Refused vitals 

## 2018-07-10 NOTE — ED Notes (Signed)
Patient is alert and oriented x 4.  Presents with flat affect and depressed mood.  Denies suicidal thoughts, auditory and visual hallucinations.  Medications given as prescribed.  Routine safety checks maintained every 15 minutes and via security camera.  Discharge instruction and transferred process reviewed and discussed with patient.  Support and encouragement offered as needed.  Patient is safe on the unit.

## 2018-07-10 NOTE — Tx Team (Signed)
Initial Treatment Plan 07/10/2018 4:10 PM GERRE RANUM ITG:[id number]    PATIENT STRESSORS: Financial difficulties Health problems Occupational concerns   PATIENT STRENGTHS: Ability for insight Average or above average intelligence Communication skills General fund of knowledge Motivation for treatment/growth Supportive family/friends   PATIENT IDENTIFIED PROBLEMS: "my anger"  "wearing my heart on my sleeve"                   DISCHARGE CRITERIA:  Ability to meet basic life and health needs Improved stabilization in mood, thinking, and/or behavior Medical problems require only outpatient monitoring  PRELIMINARY DISCHARGE PLAN: Participate in family therapy Return to previous living arrangement  PATIENT/FAMILY INVOLVEMENT: This treatment plan has been presented to and reviewed with the patient, Jasmine [name].  The patient and family have been given the opportunity to ask questions and make suggestions.  Baron Sane, RN 07/10/2018, 4:10 PM

## 2018-07-10 NOTE — Progress Notes (Addendum)
Inpatient Diabetes Program Recommendations  AACE/ADA: New Consensus Statement on Inpatient Glycemic Control (2015)  Target Ranges:  Prepandial:   less than 140 mg/dL      Peak postprandial:   less than 180 mg/dL (1-2 hours)      Critically ill patients:  140 - 180 mg/dL   Results for Jasmine [name], Jasmine [name] (MRN [id number]) as of 07/10/2018 08:52  Ref. Range 07/09/2018 07:13 07/09/2018 07:49 07/09/2018 08:49 07/09/2018 11:31 07/09/2018 16:51 07/09/2018 20:55  Glucose-Capillary Latest Ref Range: 70 - 99 mg/dL 576 (HH) 536 (HH)  10 units NOVOLOG  450 (H) 427 (H)  18 units NOVOLOG  291 (H)  8 units NOVOLOG +  50 units LANTUS at 6pm  363 (H)  5 units NOVOLOG    Results for Jasmine [name], Jasmine [name] (MRN [id number]) as of 07/10/2018 08:52  Ref. Range 07/10/2018 07:41  Glucose-Capillary Latest Ref Range: 70 - 99 mg/dL 228 (H)  9 units NOVOLOG     Admit with: Suicidal/ Depression/ Benzodiazepine Overdose  History: Type 1 Diabetes, CKD  Home DM Meds: Insulin Pump  Current Orders: Lantus 50 units Daily      Novolog Moderate Correction Scale/ SSI (0-15 units) TID AC + HS      Novolog 4 units TID with meals     Endocrinologist: Dr. Iran Planas with [facility] in [place]--last seen 06/06/2018.  I have placed a call to pt's ENDO office today.  Per DM Educator (pump trainer) in the office, patient was instructed to decrease her basal rates on her pump by 20% on 07/03/2018.  Per the DM Educator, these rates would be approximately the following: 12am- 2 units/hr 10am- 2.2 units/hr 7pm- 2.3 units/hr (51.3 total units per 24 hour period)  Last Carb Ratio as of Sept 2019 was 1 unit for every 16 grams CHO  Last Correction Factor as of Sept 2019 was 1 unit for every 50 mg/dl above target CBG  Note Lantus started last PM.  CBG down to 228 mg/dl this AM.       --Will follow patient during hospitalization--  Wyn Quaker RN, MSN, CDE Diabetes  Coordinator Inpatient Glycemic Control Team Team Pager: [phone] (8a-5p)

## 2018-07-10 NOTE — ED Notes (Signed)
Report to [employer].

## 2018-07-11 LAB — CBG MONITORING, ED
GLUCOSE-CAPILLARY: 291 mg/dL — AB (ref 70–99)
Glucose-Capillary: 365 mg/dL — ABNORMAL HIGH (ref 70–99)
Glucose-Capillary: 370 mg/dL — ABNORMAL HIGH (ref 70–99)

## 2018-07-11 LAB — CBC WITH DIFFERENTIAL/PLATELET
ABS IMMATURE GRANULOCYTES: 0.02 10*3/uL (ref 0.00–0.07)
Basophils Absolute: 0 10*3/uL (ref 0.0–0.1)
Basophils Relative: 1 %
Eosinophils Absolute: 0.2 10*3/uL (ref 0.0–0.5)
Eosinophils Relative: 3 %
HCT: 41.8 % (ref 36.0–46.0)
Hemoglobin: 12.8 g/dL (ref 12.0–15.0)
Immature Granulocytes: 0 %
Lymphocytes Relative: 26 %
Lymphs Abs: 1.8 10*3/uL (ref 0.7–4.0)
MCH: 28.1 pg (ref 26.0–34.0)
MCHC: 30.6 g/dL (ref 30.0–36.0)
MCV: 91.9 fL (ref 80.0–100.0)
Monocytes Absolute: 0.6 10*3/uL (ref 0.1–1.0)
Monocytes Relative: 8 %
NEUTROS ABS: 4.3 10*3/uL (ref 1.7–7.7)
Neutrophils Relative %: 62 %
Platelets: 207 10*3/uL (ref 150–400)
RBC: 4.55 MIL/uL (ref 3.87–5.11)
RDW: 14.7 % (ref 11.5–15.5)
WBC: 6.9 10*3/uL (ref 4.0–10.5)
nRBC: 0 % (ref 0.0–0.2)

## 2018-07-11 LAB — BASIC METABOLIC PANEL
Anion gap: 10 (ref 5–15)
BUN: 34 mg/dL — ABNORMAL HIGH (ref 6–20)
CO2: 27 mmol/L (ref 22–32)
Calcium: 9.1 mg/dL (ref 8.9–10.3)
Chloride: 95 mmol/L — ABNORMAL LOW (ref 98–111)
Creatinine, Ser: 2.57 mg/dL — ABNORMAL HIGH (ref 0.44–1.00)
GFR calc Af Amer: 25 mL/min — ABNORMAL LOW (ref >60)
GFR, EST NON AFRICAN AMERICAN: 22 mL/min — AB (ref >60)
Glucose, Bld: 394 mg/dL — ABNORMAL HIGH (ref 70–99)
Potassium: 3.9 mmol/L (ref 3.5–5.1)
SODIUM: 132 mmol/L — AB (ref 135–145)

## 2018-07-11 LAB — URINALYSIS, ROUTINE W REFLEX MICROSCOPIC
BILIRUBIN URINE: NEGATIVE
Glucose, UA: >=500 mg/dL — AB
Hgb urine dipstick: NEGATIVE
Ketones, ur: 5 mg/dL — AB
Leukocytes, UA: NEGATIVE
Nitrite: NEGATIVE
Protein, ur: 30 mg/dL — AB
SPECIFIC GRAVITY, URINE: 1.008 (ref 1.005–1.030)
pH: 6 (ref 5.0–8.0)

## 2018-07-11 LAB — GLUCOSE, CAPILLARY
Glucose-Capillary: 27 mg/dL — CL (ref 70–99)
Glucose-Capillary: 48 mg/dL — ABNORMAL LOW (ref 70–99)
Glucose-Capillary: 53 mg/dL — ABNORMAL LOW (ref 70–99)
Glucose-Capillary: 113 mg/dL — ABNORMAL HIGH (ref 70–99)
Glucose-Capillary: 239 mg/dL — ABNORMAL HIGH (ref 70–99)

## 2018-07-11 LAB — TSH: TSH: 2.169 u[IU]/mL (ref 0.350–4.500)

## 2018-07-11 MED ORDER — INSULIN ASPART 100 UNIT/ML ~~LOC~~ SOLN
0.0000 [IU] | Freq: Three times a day (TID) | SUBCUTANEOUS | Status: DC
  Administered 2018-07-12: 2 [IU] via SUBCUTANEOUS

## 2018-07-11 MED ORDER — CARVEDILOL 12.5 MG PO TABS
37.5000 mg | ORAL_TABLET | Freq: Two times a day (BID) | ORAL | Status: DC
  Filled 2018-07-11 (×3): qty 3

## 2018-07-11 MED ORDER — SODIUM CHLORIDE 0.9 % IV BOLUS
1000.0000 mL | Freq: Once | INTRAVENOUS | Status: AC
  Administered 2018-07-11: 1000 mL via INTRAVENOUS

## 2018-07-11 MED ORDER — INSULIN ASPART 100 UNIT/ML ~~LOC~~ SOLN
8.0000 [IU] | Freq: Once | SUBCUTANEOUS | Status: AC
  Administered 2018-07-11: 8 [IU] via SUBCUTANEOUS
  Filled 2018-07-11: qty 1

## 2018-07-11 MED ORDER — INSULIN GLARGINE 100 UNIT/ML ~~LOC~~ SOLN
40.0000 [IU] | SUBCUTANEOUS | Status: DC
  Administered 2018-07-11 – 2018-07-12 (×2): 40 [IU] via SUBCUTANEOUS
  Filled 2018-07-11: qty 0.4

## 2018-07-11 NOTE — BHH Suicide Risk Assessment (Addendum)
[facility] Admission Suicide Risk Assessment   Nursing information obtained from:  Patient Demographic factors:  Unemployed, Low socioeconomic status, Caucasian Current Mental Status:  Suicidal ideation indicated by patient, Suicide plan, Self-harm thoughts, Intention to act on suicide plan, Plan includes specific time, place, or method, Self-harm behaviors, Belief that plan would result in death Loss Factors:  Decline in physical health Historical Factors:  Prior suicide attempts, Impulsivity Risk Reduction Factors:  Sense of responsibility to family, Living with another person, especially a relative, Positive social support  Total Time spent with patient: 45 minutes Principal Problem: Depression Diagnosis:  Active Problems:   Bipolar 2 disorder, major depressive episode ([place])   MDD (major depressive disorder), recurrent, severe, with psychosis ([place])  Subjective Data:   Continued Clinical Symptoms:  Alcohol Use Disorder Identification Test Final Score (AUDIT): 0 The "Alcohol Use Disorders Identification Test", Guidelines for Use in Primary Care, Second Edition.  World Pharmacologist [facility]). Score between 0-7:  no or low risk or alcohol related problems. Score between 8-15:  moderate risk of alcohol related problems. Score between 16-19:  high risk of alcohol related problems. Score 20 or above:  warrants further diagnostic evaluation for alcohol dependence and treatment.   CLINICAL FACTORS:  40, married, no children, unemployed . Patient presented to ED voluntarily with family members  due to suicidal ideations and worsening depression. Prior to admission had also taken 15 mgrs of Valium ( normally takes 2.5 mgrs or 5 mgrs daily) in an attempt to get some sleep. States she has been taking psychiatric medications as prescribed .  Endorses neurovegetative symptoms- poor sleep, low energy, poor concentration, guilty ruminations. She states she has been hearing unintelligible " whispers ".    Has been facing significant stressors, including recently losing her disability benefits, financial difficulties, unemployment ( states she recently lost job) . Denies history of alcohol or drug abuse . History of prior psychiatric admissions, last time 10 years ago, for depression, suicidal ideations. Reports history of intermittent auditory hallucinations. History of one prior suicide attempt years ago by cutting arm. Has been diagnosed with bipolar disorder II in the past. Outpatient Psychiatrist is Dr. Modesta Messing. Medical History- DM I, Chronic Renal Failure, Hypothyroidism, Fibromyalgia.  Home medications- Wellbutrin XL 300 mgrs QDAY x 3 months. Valium 2.5-5 mgrs QDAY as a PRN for anxiety, states takes 2-3 times per week on average. Cymbalta 60 mgrs BID. Neurontin 300 mgrs QHS. Lamictal 200 mgrs QDAY.  Family History Mother had history of depression and psychiatric admissions , mathernal grandfather committed suicide.    Dx- Bipolar Disorder, II Depressed   Plan- Inpatient admission. Of note, patient had episode of hypoglycemia earlier this a.m. down to 27 at around 5 AM, currently normalized to 113.  She also is noted to have low blood pressure at 90/50, pulse 81.  She does endorse some dizziness on standing.  I have spoken with hospitalist consultant, Dr. Tyrell Antonio, who has made changes to diabetic/insulin dose and who has recommended patient be sent to ED for possible IV hydration and stat labs.     Musculoskeletal: Strength & Muscle Tone: within normal limits Gait & Station: normal Patient leans: N/A  Psychiatric Specialty Exam: Physical Exam  ROS  No headache, no chest pain, no shortness of breath, no nausea, no vomiting, no fever, no rash   Blood pressure (!) 90/50, pulse 81, temperature 97.8 F (36.6 C), resp. rate 16, height 5\' 7"  (1.702 m), weight (!) 137 kg, SpO2 96 %.Body mass index is 47.3  kg/m.  General Appearance: Fairly Groomed  Eye Contact:  Good  Speech:  Normal Rate   Volume:  Decreased  Mood:  reports feeling a " little better".  Affect:  Constricted  Thought Process:  Linear and Descriptions of Associations: Intact  Orientation:  Other:  fully alert and attentive   Thought Content:  no current hallucinations, reports intermittent auditory hallucinations described as unintelligible whispers , but none today. Currently does not appear internally preoccupied. No delusions expressed   Suicidal Thoughts:  No denies suicidal or self injurious ideations today, contracts for safety at this time, no homicidal or violent ideations  Homicidal Thoughts:  No  Memory:  recent and remote grossly intact  Judgement:  Fair  Insight:  Fair  Psychomotor Activity:  Decreased  Concentration:  Concentration: Good and Attention Span: Good  Recall:  Good  Fund of Knowledge:  Good  Language:  Good  Akathisia:  Negative  Handed:  Right  AIMS (if indicated):     Assets:  Communication Skills Desire for Improvement Resilience  ADL's:  Intact  Cognition:  WNL  Sleep:  Number of Hours: 6.5      COGNITIVE FEATURES THAT CONTRIBUTE TO RISK:  Closed-mindedness and Loss of executive function    SUICIDE RISK:   Moderate:  Frequent suicidal ideation with limited intensity, and duration, some specificity in terms of plans, no associated intent, good self-control, limited dysphoria/symptomatology, some risk factors present, and identifiable protective factors, including available and accessible social support.  PLAN OF CARE: Patient will be admitted to inpatient psychiatric unit for stabilization and safety. Will provide and encourage milieu participation. Provide medication management and maked adjustments as needed.  Will follow daily.    I certify that inpatient services furnished can reasonably be expected to improve the patient's condition.   Jenne Campus, MD 07/11/2018, 9:48 AM

## 2018-07-11 NOTE — Progress Notes (Signed)
The patient did not have anything to share with the group since she spent much of the day in the hospital.

## 2018-07-11 NOTE — ED Notes (Signed)
Di Kindle, RN provided patient diet ginger ale and 2 pks of crackers with permission from [place], [place].

## 2018-07-11 NOTE — Progress Notes (Signed)
Pt attended spiritual care group on grief and loss facilitated by chaplain Jerene Pitch   Group opened with brief discussion and psycho-social ed around grief and loss in relationships and in relation to self - identifying life patterns, circumstances, changes that cause losses. Established group norm of speaking from own life experience. Group goal of establishing open and affirming space for members to share loss and experience with grief, normalize grief experience and provide psycho social education and grief support.     Jasmine [name] was present for approx half of group time.  She was pulled from group by care team.  Returned to group and then left group on her own.    Did not engage in group discussion.

## 2018-07-11 NOTE — Progress Notes (Signed)
Patient with initial CBG this AM of "27." This writer admin 1 glucose gel while Healther RN notified Adora Fridge, NP. Patient conscious, conversant, A&O x 4. Recheck at 0607 was "48." Per protocol, 1 4 oz juice given, NP notified. Recheck at 0628 ws "30." Patient remained alert throughout. Recheck at 0644 was "113." Patient resting in bed, safe. Patient states this happens when she does not have her pump.

## 2018-07-11 NOTE — ED Notes (Signed)
Patient's IV infiltrated due to patient moving arm around. RN removed IV and attempted to place another IV and was unable to get a successful IV.

## 2018-07-11 NOTE — Progress Notes (Signed)
Inpatient Diabetes Program Recommendations  AACE/ADA: New Consensus Statement on Inpatient Glycemic Control (2015)  Target Ranges:  Prepandial:   less than 140 mg/dL      Peak postprandial:   less than 180 mg/dL (1-2 hours)      Critically ill patients:  140 - 180 mg/dL   Results for SHAVETTE, SHOAFF (MRN [id number]) as of 07/11/2018 07:17  Ref. Range 07/10/2018 07:41 07/10/2018 11:17 07/10/2018 17:23 07/10/2018 20:49  Glucose-Capillary Latest Ref Range: 70 - 99 mg/dL 228 (H)  9 units NOVOLOG  225 (H)  9 units NOVOLOG  223 (H)  9 units NOVOLOG +  50 units LANTUS given at 6:30pm  258 (H)  3 units NOVOLOG    Results for CHRISTE, TELLEZ (MRN [id number]) as of 07/11/2018 07:17  Ref. Range 07/11/2018 05:52 07/11/2018 06:07 07/11/2018 06:28 07/11/2018 06:44  Glucose-Capillary Latest Ref Range: 70 - 99 mg/dL 27 (LL) 48 (L) 53 (L) 113 (H)    Admit with: Suicidal/ Depression/ Benzodiazepine Overdose  History: Type 1 Diabetes, CKD  Home DM Meds: Insulin Pump  Current Orders: Lantus 50 units Daily                            Novolog Moderate Correction Scale/ SSI (0-15 units) TID AC + HS                            Novolog 4 units TID with meals     MD- Note patient with severe Hypoglycemia this AM.  Recommend the following insulin adjustments:  1. Reduce Lantus to 40 units Daily (give at 6pm) [20% reduction]  2. Reduce Novolog SSI to Sensitive scale (0-9 units) TID AC  3. Stop Bedtime SSI for now (just cover TID [facility])      Endocrinologist: Dr. Iran Planas with [facility] in [place]--last seen 06/06/2018.  Placed a call to pt's ENDO office yest (02/03).  Per DM Educator (pump trainer) in the office, patient was instructed to decrease her basal rates on her pump by 20% on 07/03/2018.  Per the DM Educator, these rates would be approximately the following: 12am- 2 units/hr 10am- 2.2 units/hr 7pm- 2.3 units/hr (51.3 total units per 24 hour  period)  Last Carb Ratio as of Sept 2019 was 1 unit for every 16 grams CHO  Last Correction Factor as of Sept 2019 was 1 unit for every 50 mg/dl above target CBG     --Will follow patient during hospitalization--  Wyn Quaker RN, MSN, CDE Diabetes Coordinator Inpatient Glycemic Control Team Team Pager: [phone] (8a-5p)

## 2018-07-11 NOTE — Progress Notes (Addendum)
Per Dr. Parke Poisson, send pt to the [facility] for fluid replacement per hospitalist d/t am CBG of 27 and b/p of 90/50. Writer called report to [employer], Camera operator at [employer].  Pt will be transported via [facility] along with a [facility] sitter. [facility] transportion called and will pick the pt up in 45 minutes.

## 2018-07-11 NOTE — ED Notes (Signed)
RN called report back to [facility] and RN called [facility] for transport.

## 2018-07-11 NOTE — Progress Notes (Signed)
Called down to [facility] ED.  Was told pt is currently in the waiting area and is having labs drawn.  Was told pt does not have a specific nurse assigned to her at this time and that patient has been triaged and labs are currently pending.    Called over to [facility] and spoke with Sharl Ma, RN who was working with Ms. Pata prior to her transfer to [facility] ED.  Discussed with Sharl Ma that I called [facility] ED to check on patient but was not given much info and was told that pt was in the waiting room.  Discussed with Sharl Ma that if patient comes back to [facility] this evening, that I wanted to make sure that patient gets her reduced dose of Lantus tonight (pt got 50 units Lantus last PM and had severe HYPO this AM--Lantus dose was reduced to 40 units for tonight).  Patient has Type 1 DM and does not make any endogenous insulin and has to have basal insulin on board to prevent DKA.  Asked Patrice to please make sure pt is given her Lantus at 6pm tonight if she gets back on the day shift.  If patient not back until the eveniong shift, asked Patrice to please alert oncoming RN that patient will need her Lantus as soon as she gets back from the [facility] ED.  Patrice to call over to the [facility] ED later this afternoon to make sure patient also gets CBG checked for 4-5pm timing.    --Will follow patient during hospitalization--  Wyn Quaker RN, MSN, CDE Diabetes Coordinator Inpatient Glycemic Control Team Team Pager: [phone] (8a-5p)

## 2018-07-11 NOTE — H&P (Addendum)
Psychiatric Admission Assessment Adult  Patient Identification: Jasmine [name] MRN:  [id number] Date of Evaluation:  07/11/2018 Chief Complaint:  Bipolar II disorder Principal Diagnosis: Bipolar 2 disorder, major depressive episode ([place]) Diagnosis:  Principal Problem:   Bipolar 2 disorder, major depressive episode ([place]) Active Problems:   MDD (major depressive disorder), recurrent, severe, with psychosis ([place])  History of Present Illness: [name] is a 48 year old female with history of type 1 diabetes, CKD, HTN, hypothyroidism, fibrymyalgia, chronic fatigue syndrome, and bipolar II, presenting for treatment of suicidal ideation with thoughts of cutting herself. She reports financial stress as major contributor- her disability payments were cut off in December. Since then she reports filling out ~237 job applications. 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 She also reports relationship issues with her husband, who recently caught her with her boyfriend; she and husband are working to restore marriage. She reports worsening depression with fatigue, insomnia, guilt, impaired focus, and suicidal thoughts to cut self over the last 2 months. Also reports intermittent AH for years of 2 people whispering, last heard 3-4 days ago. She is currently taking Lamictal 200 mg daily, Cymbalta 60 mg BID, Wellbutrin XL 300 daily, gabapentin 300 mg QHS, and Valium 2.5 mg PRN (reports taking 2-3x/week). Reports med compliance. She uses an insulin pump at home. Denies ETOH and substance use. BAL<10. UDS positive for BZDs only. Patient's CBG was 27 this morning (restored to 113 with PO glucose gel and juice), and bp 90/50, hr 81. She reports feeling dizzy while standing. Hospitalist recommends she be sent to [facility] for evaluation.  Associated Signs/Symptoms: Depression Symptoms:  depressed mood, insomnia, fatigue, feelings of  worthlessness/guilt, difficulty concentrating, suicidal thoughts without plan, (Hypo) Manic Symptoms:  denies Anxiety Symptoms:  Excessive Worry, Psychotic Symptoms:  Hallucinations: Auditory PTSD Symptoms: Negative Total Time spent with patient: 45 minutes  Past Psychiatric History: Long history of depression, diagnosed with bipolar disorder. Two prior hospitalizations, with most recent ten years ago for SI. One prior suicide attempt via cutting in early 20s. Per chart review, history of periods with increased energy, activity, euphoria and excessive spending.  Is the patient at risk to self? Yes.    Has the patient been a risk to self in the past 6 months? Yes.    Has the patient been a risk to self within the distant past? Yes.    Is the patient a risk to others? No.  Has the patient been a risk to others in the past 6 months? No.  Has the patient been a risk to others within the distant past? No.   Prior Inpatient Therapy:   Prior Outpatient Therapy:    Alcohol Screening: 1. How often do you have a drink containing alcohol?: Never 2. How many drinks containing alcohol do you have on a typical day when you are drinking?: 1 or 2 3. How often do you have six or more drinks on one occasion?: Never AUDIT-C Score: 0 4. How often during the last year have you found that you were not able to stop drinking once you had started?: Never 5. How often during the last year have you failed to do what was normally expected from you becasue of drinking?: Never 6. How often during the last year have you needed a first drink in the morning to get yourself going after a heavy drinking session?: Never 7.  How often during the last year have you had a feeling of guilt of remorse after drinking?: Never 8. How often during the last year have you been unable to remember what happened the night before because you had been drinking?: Never 9. Have you or someone else been injured as a result of your  drinking?: No 10. Has a relative or friend or a doctor or another health worker been concerned about your drinking or suggested you cut down?: No Alcohol Use Disorder Identification Test Final Score (AUDIT): 0 Substance Abuse History in the last 12 months:  No. Consequences of Substance Abuse: NA Previous Psychotropic Medications: Yes  Psychological Evaluations: No  Past Medical History:  Past Medical History:  Diagnosis Date  . Anxiety   . Bipolar disorder ([place])   . Chronic fatigue   . CKD (chronic kidney disease), stage II   . Depression   . Diabetes mellitus   . DKA, type 1 ([place]) 11/04/2011  . Elevated cholesterol   . Fibromyalgia   . GERD (gastroesophageal reflux disease)   . Headache   . Hyperlipemia   . Hypertension   . Hypothyroidism   . IBS (irritable bowel syndrome)   . Obesity   . Sleep apnea    HAS C -PAP / DOES NOT USE  . Stress incontinence    Pt had surgery to correct this.  . Tachycardia   . Tobacco abuse   . UTI (lower urinary tract infection)     Past Surgical History:  Procedure Laterality Date  . INCONTINENCE SURGERY    . NASAL FRACTURE SURGERY    . ovary removed    . OVARY SURGERY    . PUBOVAGINAL SLING  08/16/2011   Procedure: [name];  Surgeon: Bernestine Amass, MD;  Location: [facility];  Service: Urology;  Laterality: N/A;         . UTERINE FIBROID SURGERY  2001   Family History:  Family History  Problem Relation Age of Onset  . Asthma Mother   . Bipolar disorder Mother   . Heart disease Father   . Lymphoma Father   . Hypertension Father   . Thyroid disease Father   . Hyperlipidemia Father   . Diabetes Father   . Cancer Paternal Grandmother        lung and breast  . Bladder Cancer Paternal Grandfather   . Suicidality Maternal Grandfather   . Thyroid disease Brother    Family Psychiatric  History: Mother was hospitalized twice for depression. Maternal grandfather and uncle both died by suicide. Tobacco Screening:   Social  History:  Social History   Substance and Sexual Activity  Alcohol Use No     Social History   Substance and Sexual Activity  Drug Use No    Additional Social History:                           Allergies:   Allergies  Allergen Reactions  . Ciprofloxacin Swelling and Other (See Comments)    Per pt caused lips swell and nauseous feeling  . Levaquin [Levofloxacin] Swelling and Other (See Comments)    Per pt caused lips swell and nauseous feeling  . Buspar [Buspirone] Other (See Comments)    abd cramping  . Linaclotide Other (See Comments)  . Advair Diskus [Fluticasone-Salmeterol] Other (See Comments)    Thrush   . Biaxin [Clarithromycin] Rash  . Hydroxyzine Palpitations   Lab Results:  Results for orders  placed or performed during the hospital encounter of 07/10/18 (from the past 48 hour(s))  Glucose, capillary     Status: Abnormal   Collection Time: 07/10/18  5:23 PM  Result Value Ref Range   Glucose-Capillary 223 (H) 70 - 99 mg/dL  Glucose, capillary     Status: Abnormal   Collection Time: 07/10/18  8:49 PM  Result Value Ref Range   Glucose-Capillary 258 (H) 70 - 99 mg/dL  Glucose, capillary     Status: Abnormal   Collection Time: 07/11/18  5:52 AM  Result Value Ref Range   Glucose-Capillary 27 (LL) 70 - 99 mg/dL  Glucose, capillary     Status: Abnormal   Collection Time: 07/11/18  6:07 AM  Result Value Ref Range   Glucose-Capillary 48 (L) 70 - 99 mg/dL   Comment 1 Notify RN   Glucose, capillary     Status: Abnormal   Collection Time: 07/11/18  6:28 AM  Result Value Ref Range   Glucose-Capillary 53 (L) 70 - 99 mg/dL   Comment 1 Notify RN   Glucose, capillary     Status: Abnormal   Collection Time: 07/11/18  6:44 AM  Result Value Ref Range   Glucose-Capillary 113 (H) 70 - 99 mg/dL  TSH     Status: None   Collection Time: 07/11/18  6:55 AM  Result Value Ref Range   TSH 2.169 0.350 - 4.500 uIU/mL    Comment: Performed by a 3rd Generation assay with a  functional sensitivity of <=0.01 uIU/mL. Performed at [facility], [place] [address], [place], [place] [address]   CBG monitoring, ED     Status: Abnormal   Collection Time: 07/11/18 11:56 AM  Result Value Ref Range   Glucose-Capillary 365 (H) 70 - 99 mg/dL    Blood Alcohol level:  Lab Results  Component Value Date   ETH <10 07/08/2018   [facility]  05/05/2010    <5        LOWEST DETECTABLE LIMIT FOR SERUM ALCOHOL IS 5 mg/dL FOR MEDICAL PURPOSES ONLY    Metabolic Disorder Labs:  Lab Results  Component Value Date   HGBA1C 9.6 (H) 12/25/2012   MPG 229 (H) 12/25/2012   MPG 235 (H) 09/24/2012   No results found for: PROLACTIN No results found for: CHOL, TRIG, HDL, CHOLHDL, VLDL, LDLCALC  Current Medications: Current Facility-Administered Medications  Medication Dose Route Frequency Provider Last Rate Last Dose  . acetaminophen (TYLENOL) tablet 650 mg  650 mg Oral Q6H PRN Derrill Center, NP      . albuterol (PROVENTIL HFA;VENTOLIN HFA) 108 (90 Base) MCG/ACT inhaler 1-2 puff  1-2 puff Inhalation Q6H PRN Ethelene Hal, NP   2 puff at 07/10/18 2059  . alum & mag hydroxide-simeth (MAALOX/MYLANTA) 200-200-20 MG/5ML suspension 30 mL  30 mL Oral Q4H PRN Derrill Center, NP      . aspirin EC tablet 81 mg  81 mg Oral QHS Ethelene Hal, NP   81 mg at 07/10/18 2100  . buPROPion (WELLBUTRIN XL) 24 hr tablet 300 mg  300 mg Oral Daily Ethelene Hal, NP   300 mg at 07/10/18 2100  . cholecalciferol (VITAMIN D3) tablet 1,000 Units  1,000 Units Oral Daily Ethelene Hal, NP   1,000 Units at 07/11/18 0804  . diclofenac sodium (VOLTAREN) 1 % transdermal gel 1 application  1 application Topical Daily PRN Ethelene Hal, NP      . dicyclomine (BENTYL) capsule 10 mg  10 mg Oral TID PRN Ethelene Hal, NP      . DULoxetine (CYMBALTA) DR capsule 60 mg  60 mg Oral BID Ethelene Hal, NP   60 mg at 07/11/18 0804  . folic acid (FOLVITE) tablet 1  mg  1,000 mcg Oral QPM Ethelene Hal, NP   1 mg at 07/10/18 1830  . gabapentin (NEURONTIN) capsule 300 mg  300 mg Oral QHS Ethelene Hal, NP   300 mg at 07/10/18 2059  . insulin aspart (novoLOG) injection 0-9 Units  0-9 Units Subcutaneous TID WC Regalado, Belkys A, MD      . insulin glargine (LANTUS) injection 40 Units  40 Units Subcutaneous Q24H Regalado, Belkys A, MD      . lamoTRIgine (LAMICTAL) tablet 200 mg  200 mg Oral Daily Ethelene Hal, NP   200 mg at 07/10/18 2100  . levothyroxine (SYNTHROID, LEVOTHROID) tablet 150 mcg  150 mcg Oral Q0600 Ethelene Hal, NP   150 mcg at 07/11/18 9417  . lubiprostone (AMITIZA) capsule 8 mcg  8 mcg Oral PRN Ethelene Hal, NP      . magnesium hydroxide (MILK OF MAGNESIA) suspension 30 mL  30 mL Oral Daily PRN Derrill Center, NP      . ondansetron (ZOFRAN-ODT) disintegrating tablet 4 mg  4 mg Oral Q8H PRN Ethelene Hal, NP      . pantoprazole (PROTONIX) EC tablet 40 mg  40 mg Oral Daily Ethelene Hal, NP   40 mg at 07/11/18 0804  . pravastatin (PRAVACHOL) tablet 80 mg  80 mg Oral QHS Ethelene Hal, NP      . SUMAtriptan [facility]) tablet 50 mg  50 mg Oral PRN Ethelene Hal, NP      . traZODone (DESYREL) tablet 50 mg  50 mg Oral QHS PRN Ethelene Hal, NP   50 mg at 07/10/18 2059   Current Outpatient Medications  Medication Sig Dispense Refill  . acetaminophen (TYLENOL) 500 MG tablet Take 1,000 mg by mouth every 6 (six) hours as needed for moderate pain or headache.     Marland Kitchen aspirin EC 81 MG tablet Take 81 mg by mouth at bedtime.     Marland Kitchen buPROPion (WELLBUTRIN XL) 300 MG 24 hr tablet Take 1 tablet (300 mg total) by mouth daily. 90 tablet 0  . carvedilol (COREG) 25 MG tablet Take 37.5 mg by mouth 2 (two) times daily with a meal.     . cholecalciferol (VITAMIN D) 1000 UNITS tablet Take 1,000 Units by mouth daily.    . diazepam (VALIUM) 5 MG tablet Take 0.5 tablets (2.5 mg total) by mouth  daily as needed for anxiety or muscle spasms. 30 tablet 0  . Diclofenac Sodium (PENNSAID) 2 % SOLN Place 1 application onto the skin daily as needed (for back pain).    Marland Kitchen dicyclomine (BENTYL) 10 MG capsule Take 10 mg by mouth 3 (three) times daily as needed for spasms.    . DULoxetine (CYMBALTA) 60 MG capsule Take 1 capsule (60 mg total) by mouth 2 (two) times daily. 180 capsule 0  . fluticasone (FLONASE) 50 MCG/ACT nasal spray Place 2 sprays into the nose daily as needed for allergies.     . folic acid (FOLVITE) 408 MCG tablet Take 400 mcg by mouth every evening.     . furosemide (LASIX) 40 MG tablet Take 40 mg by mouth daily.     Marland Kitchen gabapentin (NEURONTIN) 300 MG capsule Take 300  mg by mouth at bedtime.     . lamoTRIgine (LAMICTAL) 200 MG tablet Take 1 tablet (200 mg total) by mouth daily. 90 tablet 0  . levothyroxine (SYNTHROID, LEVOTHROID) 150 MCG tablet Take 150 mcg by mouth daily before breakfast.    . lubiprostone (AMITIZA) 8 MCG capsule Take 8 mcg by mouth as needed for constipation.     Marland Kitchen NOVOLOG 100 UNIT/ML injection Inject 2.425-2.95 Units into the skin daily. Via pump 7pm to midnight 2.950 8:30am -7pm2.925 Midnight - 8:30 2.425  2  . Omega-3 Fatty Acids (FISH OIL PO) Take 1 capsule by mouth daily.    Marland Kitchen omeprazole (PRILOSEC) 20 MG capsule Take 20 mg by mouth at bedtime.     . ondansetron (ZOFRAN ODT) 4 MG disintegrating tablet Take 1 tablet (4 mg total) by mouth every 8 (eight) hours as needed. (Patient taking differently: Take 4 mg by mouth every 8 (eight) hours as needed for nausea or vomiting. ) 20 tablet 6  . pravastatin (PRAVACHOL) 80 MG tablet Take 80 mg by mouth at bedtime.    Marland Kitchen PROAIR HFA 108 (90 Base) MCG/ACT inhaler Inhale 1-2 puffs into the lungs every 6 (six) hours as needed for wheezing or shortness of breath.   1  . rizatriptan (MAXALT-MLT) 10 MG disintegrating tablet Take 1 tablet (10 mg total) as needed by mouth. May repeat in 2 hours if needed (Patient taking  differently: Take 10 mg by mouth as needed for migraine. May repeat in 2 hours if needed) 15 tablet 11   PTA Medications: (Not in a hospital admission)   Musculoskeletal: Strength & Muscle Tone: within normal limits Gait & Station: ambulating well with walking boot Patient leans: N/A  Psychiatric Specialty Exam: Physical Exam  Nursing note and vitals reviewed. Constitutional: She is oriented to person, place, and time. She appears well-developed and well-nourished.  Cardiovascular: Normal rate.  Respiratory: Effort normal.  Neurological: She is alert and oriented to person, place, and time.    Review of Systems  Constitutional: Negative.   Psychiatric/Behavioral: Positive for depression, hallucinations and suicidal ideas. Negative for memory loss and substance abuse. The patient is nervous/anxious and has insomnia.     Blood pressure (!) 90/50, pulse 81, temperature 97.8 F (36.6 C), resp. rate 16, height 5' 7"  (1.702 m), weight (!) 137 kg, SpO2 96 %.Body mass index is 47.3 kg/m.  See MD's admission SRA    Treatment Plan Summary: Daily contact with patient to assess and evaluate symptoms and progress in treatment and Medication management   Inpatient hospitalization.  Sent to [facility] for evaluation (hypoglycemia episode this AM, low blood pressure)  See MD's admission SRA for medication management.  Patient will participate in the therapeutic group milieu.  Discharge disposition in progress.   Observation Level/Precautions:  15 minute checks  Laboratory:  Sent to ED for stat labs, evaluation  Psychotherapy:  Group therapy  Medications:  See [facility]  Consultations:  PRN  Discharge Concerns:  Safety and stabilization  Estimated LOS: 3-5 days  Other:     Physician Treatment Plan for Primary Diagnosis: Bipolar 2 disorder, major depressive episode ([place]) Long Term Goal(s): Improvement in symptoms so as ready for discharge  Short Term Goals: Ability to identify changes in  lifestyle to reduce recurrence of condition will improve, Ability to verbalize feelings will improve and Ability to disclose and discuss suicidal ideas  Physician Treatment Plan for Secondary Diagnosis: Principal Problem:   Bipolar 2 disorder, major depressive episode ([place]) Active Problems:  MDD (major depressive disorder), recurrent, severe, with psychosis ([place])  Long Term Goal(s): Improvement in symptoms so as ready for discharge  Short Term Goals: Ability to identify and develop effective coping behaviors will improve and Ability to identify triggers associated with substance abuse/mental health issues will improve  I certify that inpatient services furnished can reasonably be expected to improve the patient's condition.    Connye Burkitt, NP 2/4/20201:46 PM   I have discussed case with NP and have met with patient  Agree with NP note and assessment  9, married, no children, unemployed . Patient presented to ED voluntarily with family members  due to suicidal ideations and worsening depression. Prior to admission had also taken 15 mgrs of Valium ( normally takes 2.5 mgrs or 5 mgrs daily) in an attempt to get some sleep. States she has been taking psychiatric medications as prescribed .  Endorses neurovegetative symptoms- poor sleep, low energy, poor concentration, guilty ruminations. She states she has been hearing unintelligible " whispers ".   Has been facing significant stressors, including recently losing her disability benefits, financial difficulties, unemployment ( states she recently lost job) . Denies history of alcohol or drug abuse . History of prior psychiatric admissions, last time 10 years ago, for depression, suicidal ideations. Reports history of intermittent auditory hallucinations. History of one prior suicide attempt years ago by cutting arm. Has been diagnosed with bipolar disorder II in the past. Outpatient Psychiatrist is Dr. Modesta Messing. Medical History- DM I, Chronic  Renal Failure, Hypothyroidism, Fibromyalgia.  Home medications- Wellbutrin XL 300 mgrs QDAY x 3 months. Valium 2.5-5 mgrs QDAY as a PRN for anxiety, states takes 2-3 times per week on average. Cymbalta 60 mgrs BID. Neurontin 300 mgrs QHS. Lamictal 200 mgrs QDAY.  Family History Mother had history of depression and psychiatric admissions , mathernal grandfather committed suicide.    Dx- Bipolar Disorder, II Depressed   Plan- Inpatient admission. Of note, patient had episode of hypoglycemia earlier this a.m. down to 27 at around 5 AM, currently normalized to 113.  She also is noted to have low blood pressure at 90/50, pulse 81.  She does endorse some dizziness on standing.  I have spoken with hospitalist consultant, Dr. Tyrell Antonio, who has made changes to diabetic/insulin dose and who has recommended patient be sent to ED for possible IV hydration and stat labs.

## 2018-07-11 NOTE — ED Notes (Signed)
Patient was brought to the ED for hypoglycemia. CBG in triage -365. Patient states she had breakfast , but was not given any insulin.

## 2018-07-11 NOTE — ED Notes (Signed)
Bed: [facility] Expected date:  Expected time:  Means of arrival:  Comments: 

## 2018-07-11 NOTE — ED Notes (Signed)
Pt informed that urine collection is needed  

## 2018-07-11 NOTE — Progress Notes (Signed)
Pt rated on her self inventory sheet depression 4/10.  Pt denied SI/HI. Pt reported fair sleep last night. Pt reported having a fair appetite today. Pt stated goal for today "acquire coping skills." Pt compliant with taking meds and denied any side effects to meds.   Medications reviewed with pt. Verbal support provided. Pt encouraged to attend groups. 15 minute checks performed for safety. Pt sent to the ED for fluid replacement per MD.  Pt compliant with tx plan.

## 2018-07-11 NOTE — ED Provider Notes (Signed)
[facility] DEPT Provider Note   CSN: [id number] Arrival date & time: 07/11/18  1149     History   Chief Complaint No chief complaint on file.   HPI Jasmine [name] is a 48 y.o. female.  HPI   Patient is a 48 year old female with history of anxiety, bipolar disorder, CKD, diabetes, type I, GERD, upper lipidemia, hypertension, who presents emergency department today for evaluation of hypoglycemia and hypotension.  Patient was noted to be hypoglycemic with a blood sugar of 27 this a.m.  She then ate breakfast and was not giving her meal dose of insulin.  She was noted to also be hypotensive with a blood pressure in the 90s.  Patient states that her blood pressure does normally run low around the 254 systolic.  She is had lower blood pressures in the past than this.  She states she felt a little bit dizzy when she was noted to have the low blood sugar and low blood pressure.  She was sent to the ED for IV fluids and labs.  Discussed case with Patrice from behavioral health who states that she discussed case with Dr. Parke Poisson who had consulted with hospitalist service.  Patient ultimately was advised to be transferred to the ED for IV fluids and laboratory work.  Reviewed records.  Patient's insulin is being managed by Deanne Coffer who was aware of patient's low blood sugar this a.m. and advised to decrease dose to 40 units tonight.  Past Medical History:  Diagnosis Date  . Anxiety   . Bipolar disorder ([place])   . Chronic fatigue   . CKD (chronic kidney disease), stage II   . Depression   . Diabetes mellitus   . DKA, type 1 ([place]) 11/04/2011  . Elevated cholesterol   . Fibromyalgia   . GERD (gastroesophageal reflux disease)   . Headache   . Hyperlipemia   . Hypertension   . Hypothyroidism   . IBS (irritable bowel syndrome)   . Obesity   . Sleep apnea    HAS C -PAP / DOES NOT USE  . Stress incontinence    Pt had surgery to correct this.  .  Tachycardia   . Tobacco abuse   . UTI (lower urinary tract infection)     Patient Active Problem List   Diagnosis Date Noted  . Bipolar 2 disorder, major depressive episode ([place]) 07/10/2018  . MDD (major depressive disorder), recurrent, severe, with psychosis ([place]) 07/10/2018  . CKD (chronic kidney disease), stage IV ([place]) 07/01/2018  . Transient Hypoglycemia 07/01/2018  . Chronic migraine 01/17/2017  . Diabetic peripheral neuropathy ([place]) 01/17/2017  . OSA (obstructive sleep apnea) 07/27/2016  . Wheezing 07/27/2016  . Hyperglycemia 12/25/2012  . Acute on chronic renal failure ([place]) 12/25/2012  . Pulmonary infiltrates 10/02/2012  . Postnasal drip 10/02/2012  . Hyperkalemia 09/25/2012  . Morbid obesity ([place]) 09/24/2012  . HTN (hypertension) 09/24/2012  . Bipolar II disorder ([place]) 09/24/2012  . URI (upper respiratory infection) 09/24/2012  . DKA, type 1 ([place]) 11/04/2011  . Gastroenteritis 11/03/2011  . DM (diabetes mellitus), type 1, uncontrolled ([place]) 11/03/2011  . Hyponatremia 11/03/2011  . Elevated lipase 11/03/2011  . Hypothyroidism 11/03/2011  . Tobacco abuse 11/03/2011  . SUI (stress urinary incontinence, female) 08/16/2011    Past Surgical History:  Procedure Laterality Date  . INCONTINENCE SURGERY    . NASAL FRACTURE SURGERY    . ovary removed    . OVARY SURGERY    . PUBOVAGINAL SLING  08/16/2011   Procedure: [name];  Surgeon: Bernestine Amass, MD;  Location: [facility] ORS;  Service: Urology;  Laterality: N/A;         . UTERINE FIBROID SURGERY  2001     OB History    Gravida  1   Para      Term      Preterm      AB      Living        SAB      TAB      Ectopic      Multiple      Live Births               Home Medications    Prior to Admission medications   Medication Sig Start Date End Date Taking? Authorizing Provider  acetaminophen (TYLENOL) 500 MG tablet Take 1,000 mg by mouth every 6 (six) hours as needed for moderate pain  or headache.     [provider]  aspirin EC 81 MG tablet Take 81 mg by mouth at bedtime.     [provider]  buPROPion (WELLBUTRIN XL) 300 MG 24 hr tablet Take 1 tablet (300 mg total) by mouth daily. 05/02/18   Norman Clay, MD  carvedilol (COREG) 25 MG tablet Take 37.5 mg by mouth 2 (two) times daily with a meal.     [provider]  cholecalciferol (VITAMIN D) 1000 UNITS tablet Take 1,000 Units by mouth daily.    [provider]  diazepam (VALIUM) 5 MG tablet Take 0.5 tablets (2.5 mg total) by mouth daily as needed for anxiety or muscle spasms. 06/05/18   Norman Clay, MD  Diclofenac Sodium (PENNSAID) 2 % SOLN Place 1 application onto the skin daily as needed (for back pain).    [provider]  dicyclomine (BENTYL) 10 MG capsule Take 10 mg by mouth 3 (three) times daily as needed for spasms.    [provider]  DULoxetine (CYMBALTA) 60 MG capsule Take 1 capsule (60 mg total) by mouth 2 (two) times daily. 05/02/18   Norman Clay, MD  fluticasone (FLONASE) 50 MCG/ACT nasal spray Place 2 sprays into the nose daily as needed for allergies.     [provider]  folic acid (FOLVITE) 782 MCG tablet Take 400 mcg by mouth every evening.     [provider]  furosemide (LASIX) 40 MG tablet Take 40 mg by mouth daily.     [provider]  gabapentin (NEURONTIN) 300 MG capsule Take 300 mg by mouth at bedtime.     [provider]  lamoTRIgine (LAMICTAL) 200 MG tablet Take 1 tablet (200 mg total) by mouth daily. 05/02/18   Norman Clay, MD  levothyroxine (SYNTHROID, LEVOTHROID) 150 MCG tablet Take 150 mcg by mouth daily before breakfast.    [provider]  lubiprostone (AMITIZA) 8 MCG capsule Take 8 mcg by mouth as needed for constipation.     [provider]  NOVOLOG 100 UNIT/ML injection Inject 2.425-2.95 Units into the skin daily. Via pump 7pm to midnight 2.950 8:30am -7pm2.925 Midnight - 8:30  2.425 02/24/17   [provider]  Omega-3 Fatty Acids (FISH OIL PO) Take 1 capsule by mouth daily.    [provider]  omeprazole (PRILOSEC) 20 MG capsule Take 20 mg by mouth at bedtime.     [provider]  ondansetron (ZOFRAN ODT) 4 MG disintegrating tablet Take 1 tablet (4 mg total) by mouth every 8 (eight)  hours as needed. Patient taking differently: Take 4 mg by mouth every 8 (eight) hours as needed for nausea or vomiting.  01/17/17   Marcial Pacas, MD  pravastatin (PRAVACHOL) 80 MG tablet Take 80 mg by mouth at bedtime.    [provider]  PROAIR HFA 108 [phone] Base) MCG/ACT inhaler Inhale 1-2 puffs into the lungs every 6 (six) hours as needed for wheezing or shortness of breath.  12/30/16   [provider]  rizatriptan (MAXALT-MLT) 10 MG disintegrating tablet Take 1 tablet (10 mg total) as needed by mouth. May repeat in 2 hours if needed Patient taking differently: Take 10 mg by mouth as needed for migraine. May repeat in 2 hours if needed 04/20/17   Marcial Pacas, MD    Family History Family History  Problem Relation Age of Onset  . Asthma Mother   . Bipolar disorder Mother   . Heart disease Father   . Lymphoma Father   . Hypertension Father   . Thyroid disease Father   . Hyperlipidemia Father   . Diabetes Father   . Cancer Paternal Grandmother        lung and breast  . Bladder Cancer Paternal Grandfather   . Suicidality Maternal Grandfather   . Thyroid disease Brother     Social History Social History   Tobacco Use  . Smoking status: Former Smoker    Packs/day: 0.75    Years: 20.00    Pack years: 15.00    Types: Cigarettes    Last attempt to quit: 06/07/2012    Years since quitting: 6.0  . Smokeless tobacco: Never Used  Substance Use Topics  . Alcohol use: No  . Drug use: No     Allergies   Ciprofloxacin; Levaquin [levofloxacin]; Buspar [buspirone]; Linaclotide; Advair diskus [fluticasone-salmeterol]; Biaxin [clarithromycin]; and  Hydroxyzine   Review of Systems Review of Systems  Constitutional: Negative for fever.  HENT: Negative for ear pain and sore throat.   Eyes: Negative for visual disturbance.  Respiratory: Negative for cough and shortness of breath.   Cardiovascular: Negative for chest pain.  Gastrointestinal: Negative for abdominal pain, constipation, diarrhea, nausea and vomiting.  Genitourinary: Negative for dysuria and hematuria.  Musculoskeletal: Negative for back pain.  Skin: Negative for rash.  Neurological: Positive for light-headedness (resolved). Negative for weakness, numbness and headaches.  All other systems reviewed and are negative.    Physical Exam Updated Vital Signs BP (!) 109/52 (BP Location: Left Arm)   Pulse 70   Temp 98.7 F (37.1 C)   Resp 16   Ht 5\' 7"  (1.702 m)   Wt (!) 137 kg   SpO2 94%   BMI 47.30 kg/m   Physical Exam Vitals signs and nursing note reviewed.  Constitutional:      General: She is not in acute distress.    Appearance: She is well-developed. She is not ill-appearing or toxic-appearing.  HENT:     Head: Normocephalic and atraumatic.  Eyes:     Conjunctiva/sclera: Conjunctivae normal.  Neck:     Musculoskeletal: Neck supple.  Cardiovascular:     Rate and Rhythm: Normal rate and regular rhythm.     Heart sounds: Normal heart sounds. No murmur.  Pulmonary:     Effort: Pulmonary effort is normal. No respiratory distress.     Breath sounds: Normal breath sounds. No stridor. No wheezing or rhonchi.  Abdominal:     General: Bowel sounds are normal.     Palpations: Abdomen is soft.  Tenderness: There is no abdominal tenderness.  Musculoskeletal: Normal range of motion.  Skin:    General: Skin is warm and dry.  Neurological:     Mental Status: She is alert and oriented to person, place, and time.  Psychiatric:        Mood and Affect: Mood normal.      ED Treatments / Results  Labs (all labs ordered are listed, but only abnormal results  are displayed) Labs Reviewed  GLUCOSE, CAPILLARY - Abnormal; Notable for the following components:      Result Value   Glucose-Capillary 223 (*)    All other components within normal limits  GLUCOSE, CAPILLARY - Abnormal; Notable for the following components:   Glucose-Capillary 258 (*)    All other components within normal limits  GLUCOSE, CAPILLARY - Abnormal; Notable for the following components:   Glucose-Capillary 27 (*)    All other components within normal limits  GLUCOSE, CAPILLARY - Abnormal; Notable for the following components:   Glucose-Capillary 48 (*)    All other components within normal limits  GLUCOSE, CAPILLARY - Abnormal; Notable for the following components:   Glucose-Capillary 53 (*)    All other components within normal limits  GLUCOSE, CAPILLARY - Abnormal; Notable for the following components:   Glucose-Capillary 113 (*)    All other components within normal limits  BASIC METABOLIC PANEL - Abnormal; Notable for the following components:   Sodium 132 (*)    Chloride 95 (*)    Glucose, Bld 394 (*)    BUN 34 (*)    Creatinine, Ser 2.57 (*)    GFR calc non Af Amer 22 (*)    GFR calc Af Amer 25 (*)    All other components within normal limits  URINALYSIS, ROUTINE W REFLEX MICROSCOPIC - Abnormal; Notable for the following components:   Glucose, UA >=500 (*)    Ketones, ur 5 (*)    Protein, ur 30 (*)    Bacteria, UA MANY (*)    All other components within normal limits  GLUCOSE, CAPILLARY - Abnormal; Notable for the following components:   Glucose-Capillary 239 (*)    All other components within normal limits  CBG MONITORING, ED - Abnormal; Notable for the following components:   Glucose-Capillary 365 (*)    All other components within normal limits  CBG MONITORING, ED - Abnormal; Notable for the following components:   Glucose-Capillary 370 (*)    All other components within normal limits  CBG MONITORING, ED - Abnormal; Notable for the following  components:   Glucose-Capillary 291 (*)    All other components within normal limits  TSH  CBC WITH DIFFERENTIAL/PLATELET  PREGNANCY, URINE    EKG None  Radiology No results found.  Procedures Procedures (including critical care time)  Medications Ordered in ED Medications  acetaminophen (TYLENOL) tablet 650 mg (has no administration in time range)  alum & mag hydroxide-simeth (MAALOX/MYLANTA) 200-200-20 MG/5ML suspension 30 mL (has no administration in time range)  magnesium hydroxide (MILK OF MAGNESIA) suspension 30 mL (has no administration in time range)  traZODone (DESYREL) tablet 50 mg (50 mg Oral Given 07/11/18 2128)  albuterol (PROVENTIL HFA;VENTOLIN HFA) 108 (90 Base) MCG/ACT inhaler 1-2 puff (2 puffs Inhalation Given 07/10/18 2059)  aspirin EC tablet 81 mg (81 mg Oral Given 07/11/18 2126)  buPROPion (WELLBUTRIN XL) 24 hr tablet 300 mg (300 mg Oral Given 07/11/18 2126)  cholecalciferol (VITAMIN D3) tablet 1,000 Units (1,000 Units Oral Given 07/11/18 0804)  diclofenac sodium (VOLTAREN)  1 % transdermal gel 1 application (has no administration in time range)  dicyclomine (BENTYL) capsule 10 mg (has no administration in time range)  DULoxetine (CYMBALTA) DR capsule 60 mg (60 mg Oral Given 07/16/54 2130)  folic acid (FOLVITE) tablet 1 mg (1 mg Oral Given 07/11/18 1748)  gabapentin (NEURONTIN) capsule 300 mg (300 mg Oral Given 07/11/18 2126)  lamoTRIgine (LAMICTAL) tablet 200 mg (200 mg Oral Given 07/11/18 2126)  levothyroxine (SYNTHROID, LEVOTHROID) tablet 150 mcg (150 mcg Oral Given 07/11/18 0659)  lubiprostone (AMITIZA) capsule 8 mcg (has no administration in time range)  ondansetron (ZOFRAN-ODT) disintegrating tablet 4 mg (has no administration in time range)  pantoprazole (PROTONIX) EC tablet 40 mg (40 mg Oral Given 07/11/18 0804)  pravastatin (PRAVACHOL) tablet 80 mg (80 mg Oral Given 07/11/18 2126)  SUMAtriptan (IMITREX) tablet 50 mg (has no administration in time range)  insulin glargine  (LANTUS) injection 40 Units (40 Units Subcutaneous Given 07/11/18 1858)  insulin aspart (novoLOG) injection 0-9 Units (0 Units Subcutaneous Not Given 07/11/18 1729)  carvedilol (COREG) 12.5 MG tablet (  Duplicate 01/11/56 8469)  sodium chloride 0.9 % bolus 1,000 mL (0 mLs Intravenous Stopped 07/11/18 1900)  insulin aspart (novoLOG) injection 8 Units (8 Units Subcutaneous Given 07/11/18 1747)     Initial Impression / Assessment and Plan / ED Course  I have reviewed the triage vital signs and the nursing notes.  Pertinent labs & imaging results that were available during my care of the patient were reviewed by me and considered in my medical decision making (see chart for details).      Final Clinical Impressions(s) / ED Diagnoses   Final diagnoses:  Hypotension, unspecified hypotension type  Hypoglycemia   Patient presenting for evaluation of hyperglycemia and hypotension.  She currently is being treated at behavioral health.  She has history of type 1 diabetes.  She normally uses an insulin pump however this was stopped due to her concerns for suicidal ideation.  She has been being treated with sliding scale insulin and long-acting insulin at behavioral health.  This morning she was noted to have a blood sugar of 27.  She is also noted to be hypotensive.  She felt somewhat dizzy at that time but this is since resolved.  She was sent to the ED with request to obtain labs and IV fluids.    Blood sugar is now elevated in the 300s that she did not receive a dose of her mealtime insulin.  On arrival to the ED she is no longer symptomatic and is no longer hypotensive.  Blood pressure is actually elevated during her stay in the ED she has not received her blood pressure medication today.  Labs were obtained.  CBC is normal.  BMP with mildly low sodium at 132.  Blood glucose is elevated at 394.  BUN and creatinine are also elevated but appear to be consistent with prior lab work.  He does not have an elevated  anion gap or low bicarb.  Urinalysis does not show significant evidence of urinary tract infection.  Does show glucosuria and minimal ketones.  Do not feel that patient's laboratory work or clinical presentation are consistent with DKA at this time.  She was given a dose of subcutaneous insulin while in the ED.  She was also given IV fluids.  Reviewed notes in epic which indicate that patient's Lantus has been reduced to 40 units daily instead of 50 units daily which is what she was given yesterday.  This  dose of Lantus was given in the ED.  Patient's blood sugar has improved to 239.  She has remained stable and asymptomatic throughout her several hour stay in the emergency department.  Feel she is safe for transfer back to behavioral health for further treatment of her psychiatric needs given there are no findings of DKA or other acute pathology that would require further admission or work-up at this time.  Patient is comfortable with plan.  Patient transferred in stable condition.  ED Discharge Orders    None       Bishop Dublin 07/12/18 0023    Merrily Pew, MD 07/12/18 Areta Haber

## 2018-07-12 DIAGNOSIS — F3181 Bipolar II disorder: Principal | ICD-10-CM

## 2018-07-12 LAB — GLUCOSE, CAPILLARY
GLUCOSE-CAPILLARY: 200 mg/dL — AB (ref 70–99)
Glucose-Capillary: 182 mg/dL — ABNORMAL HIGH (ref 70–99)
Glucose-Capillary: 306 mg/dL — ABNORMAL HIGH (ref 70–99)
Glucose-Capillary: 204 mg/dL — ABNORMAL HIGH (ref 70–99)
Glucose-Capillary: 70 mg/dL (ref 70–99)

## 2018-07-12 MED ORDER — INSULIN ASPART 100 UNIT/ML ~~LOC~~ SOLN
4.0000 [IU] | Freq: Three times a day (TID) | SUBCUTANEOUS | Status: DC
  Administered 2018-07-12 – 2018-07-13 (×4): 4 [IU] via SUBCUTANEOUS

## 2018-07-12 MED ORDER — DULOXETINE HCL 60 MG PO CPEP
60.0000 mg | ORAL_CAPSULE | Freq: Every day | ORAL | Status: DC
  Filled 2018-07-12 (×2): qty 1

## 2018-07-12 MED ORDER — INSULIN ASPART 100 UNIT/ML ~~LOC~~ SOLN: 4.0000 [IU] | Freq: Three times a day (TID) | SUBCUTANEOUS | Status: DC

## 2018-07-12 MED ORDER — INSULIN ASPART 100 UNIT/ML ~~LOC~~ SOLN
0.0000 [IU] | Freq: Three times a day (TID) | SUBCUTANEOUS | Status: DC
  Administered 2018-07-12: 7 [IU] via SUBCUTANEOUS
  Administered 2018-07-12: 15 [IU] via SUBCUTANEOUS
  Administered 2018-07-13 (×2): 4 [IU] via SUBCUTANEOUS

## 2018-07-12 NOTE — Progress Notes (Signed)
Pt attended wrap-up group this evening. Pt appears depressed/flat in affect and mood. Pt denies SI/HI/AVH/Pain at this time. CBG was 70 at bedtime; Pt was encourage to grab extra snacks in case of hypo s/s during the night. Pt was encourage to push fluids for hypotension. Pt was minimal with interaction. Pt was encourage to use wheelchair/walker when ambulating.Support offered. Will continue with POC.

## 2018-07-12 NOTE — Progress Notes (Signed)
Patient stated she was dizzy this morning, lightheaded.  Patient's VS taken, sitting and laying, then stated she was lightheaded and had a hard time standing up.  MD came and talked to patient and helped her stand.  Patient denied dizzy, lightheaded at that time.  CBG 200.   After CBG patient stated she was lightheaded again. Patient in wheelchair talking to SW at this time.  Respirations even and unlabored.  No signs/symptoms of pain/distress noted on patient's face/body movements.

## 2018-07-12 NOTE — [facility] Group Notes (Signed)
[facility] Group Notes:  (Nursing/MHT/Case Management/Adjunct)  Date:  07/12/2018  Time:  3:15 pm  Type of Therapy:  Psychoeducational Skills  Participation Level:  Active  Participation Quality:  Appropriate  Affect:  Appropriate  Cognitive:  Appropriate  Insight:  Appropriate  Engagement in Group:  Engaged  Modes of Intervention:  Education  Summary of Progress/Problems: Patient alert and involved in group.    Grayland Ormond [place] 07/12/2018, 8:02 PM

## 2018-07-12 NOTE — Progress Notes (Signed)
DAR NOTE: Pt present with flat affect and calm mood in the unit. Pt came back from [facility] after she was sent there due to hypoglycemic and later became hyperglycemic at the ED. CBG check and was 239 mg/dl at her return.  Pt offered a tray and drinks, attended group and took her evening meds with no problems then went to bed, no issues this far. Pt's safety ensured with 15 minute and environmental checks. Pt currently denies SI/HI and A/V hallucinations. Pt verbally agrees to seek staff if SI/HI or A/VH occurs and to consult with staff before acting on these thoughts. Will continue POC.

## 2018-07-12 NOTE — Plan of Care (Signed)
Nurse discussed anxiety, depression, coping skills with patient. 

## 2018-07-12 NOTE — Progress Notes (Signed)
Inpatient Diabetes Program Recommendations  AACE/ADA: New Consensus Statement on Inpatient Glycemic Control (2015)  Target Ranges:  Prepandial:   less than 140 mg/dL      Peak postprandial:   less than 180 mg/dL (1-2 hours)      Critically ill patients:  140 - 180 mg/dL   Results for RYDER, CHESMORE (MRN [id number]) as of 07/12/2018 07:27  Ref. Range 07/11/2018 05:52 07/11/2018 06:07 07/11/2018 06:28 07/11/2018 06:44 07/11/2018 11:56 07/11/2018 16:20 07/11/2018 18:37 07/11/2018 20:49  Glucose-Capillary Latest Ref Range: 70 - 99 mg/dL 27 (LL) 48 (L) 53 (L) 113 (H) 365 (H) 370 (H)  8 units NOVOLOG  291 (H)     40 units LANTUS 239 (H)   Results for RAYLINN, KOSAR (MRN [id number]) as of 07/12/2018 07:27  Ref. Range 07/12/2018 05:37  Glucose-Capillary Latest Ref Range: 70 - 99 mg/dL 182 (H)  2 units NOVOLOG     Admit with:Suicidal/ Depression/ BenzodiazepineOverdose  History:Type 1 Diabetes, CKD  Home DM Meds:Insulin Pump  Current Orders:Lantus 40 units Daily   Novolog Sensitive Correction Scale/ SSI (0-9 units) TID AC       Patient with severe Hypoglycemia yesterday (02/04) AM.  Pt had received 50 units Lantus the evening prior and I suspect this dose may have been the cause of the HYPO event yest AM.  Note that Novolog Meal Coverage was held yesterday (02/04) AM (4 units Novolog for food coverage) and pt's CBG by 12pm was 365 mg/dl.  Patient was transferred to the [facility] ED for treatment for high glucose and low BP.    MD- Note that patient does NOT currently have orders for Novolog Meal Coverage.  Patient has Type 1 Diabetes and does not make any endogenous insulin and needs Insulin with meals to cover her carbohydrates.  Please consider restarting Novolog 4 units TID with meals (Meal Coverage)  (Please add the following Hold Parameters: Hold if pt eats <50% of meal, Hold if pt NPO)      --Will follow patient during hospitalization--  Wyn Quaker RN, MSN, CDE Diabetes Coordinator Inpatient Glycemic Control Team Team Pager: [phone] (8a-5p)

## 2018-07-12 NOTE — Tx Team (Signed)
Interdisciplinary Treatment and Diagnostic Plan Update  07/12/2018 Time of Session:  Jasmine [name] MRN: [id number]  Principal Diagnosis: Bipolar 2 disorder, major depressive episode ([place])  Secondary Diagnoses: Principal Problem:   Bipolar 2 disorder, major depressive episode ([place]) Active Problems:   MDD (major depressive disorder), recurrent, severe, with psychosis ([place])   Current Medications:  Current Facility-Administered Medications  Medication Dose Route Frequency Provider Last Rate Last Dose  . acetaminophen (TYLENOL) tablet 650 mg  650 mg Oral Q6H PRN Derrill Center, NP      . albuterol (PROVENTIL HFA;VENTOLIN HFA) 108 (90 Base) MCG/ACT inhaler 1-2 puff  1-2 puff Inhalation Q6H PRN Ethelene Hal, NP   2 puff at 07/10/18 2059  . alum & mag hydroxide-simeth (MAALOX/MYLANTA) 200-200-20 MG/5ML suspension 30 mL  30 mL Oral Q4H PRN Derrill Center, NP      . aspirin EC tablet 81 mg  81 mg Oral QHS Ethelene Hal, NP   81 mg at 07/11/18 2126  . buPROPion (WELLBUTRIN XL) 24 hr tablet 300 mg  300 mg Oral Daily Ethelene Hal, NP   300 mg at 07/11/18 2126  . cholecalciferol (VITAMIN D3) tablet 1,000 Units  1,000 Units Oral Daily Ethelene Hal, NP   1,000 Units at 07/12/18 [phone]  . diclofenac sodium (VOLTAREN) 1 % transdermal gel 1 application  1 application Topical Daily PRN Ethelene Hal, NP      . dicyclomine (BENTYL) capsule 10 mg  10 mg Oral TID PRN Ethelene Hal, NP      . Derrill Memo ON 07/13/2018] DULoxetine (CYMBALTA) DR capsule 60 mg  60 mg Oral QHS Cobos, Fernando A, MD      . folic acid (FOLVITE) tablet 1 mg  1,000 mcg Oral QPM Ethelene Hal, NP   1 mg at 07/11/18 1748  . gabapentin (NEURONTIN) capsule 300 mg  300 mg Oral QHS Ethelene Hal, NP   300 mg at 07/11/18 2126  . insulin aspart (novoLOG) injection 0-9 Units  0-9 Units Subcutaneous TID WC Regalado, Belkys A, MD   2 Units at 07/12/18 0625  . insulin glargine (LANTUS)  injection 40 Units  40 Units Subcutaneous Q24H Regalado, Belkys A, MD   40 Units at 07/11/18 1858  . lamoTRIgine (LAMICTAL) tablet 200 mg  200 mg Oral Daily Ethelene Hal, NP   200 mg at 07/11/18 2126  . levothyroxine (SYNTHROID, LEVOTHROID) tablet 150 mcg  150 mcg Oral Q0600 Ethelene Hal, NP   150 mcg at 07/12/18 6283  . lubiprostone (AMITIZA) capsule 8 mcg  8 mcg Oral PRN Ethelene Hal, NP      . magnesium hydroxide (MILK OF MAGNESIA) suspension 30 mL  30 mL Oral Daily PRN Derrill Center, NP      . ondansetron (ZOFRAN-ODT) disintegrating tablet 4 mg  4 mg Oral Q8H PRN Ethelene Hal, NP      . pantoprazole (PROTONIX) EC tablet 40 mg  40 mg Oral Daily Ethelene Hal, NP   40 mg at 07/12/18 1517  . pravastatin (PRAVACHOL) tablet 80 mg  80 mg Oral QHS Ethelene Hal, NP   80 mg at 07/11/18 2126  . SUMAtriptan (IMITREX) tablet 50 mg  50 mg Oral PRN Ethelene Hal, NP       PTA Medications: Medications Prior to Admission  Medication Sig Dispense Refill Last Dose  . acetaminophen (TYLENOL) 500 MG tablet Take 1,000 mg by mouth every 6 (six) hours  as needed for moderate pain or headache.    Past Week at Unknown time  . aspirin EC 81 MG tablet Take 81 mg by mouth at bedtime.    07/07/2018 at Unknown time  . buPROPion (WELLBUTRIN XL) 300 MG 24 hr tablet Take 1 tablet (300 mg total) by mouth daily. 90 tablet 0 07/07/2018 at Unknown time  . carvedilol (COREG) 25 MG tablet Take 37.5 mg by mouth 2 (two) times daily with a meal.    07/08/2018 at 9am  . cholecalciferol (VITAMIN D) 1000 UNITS tablet Take 1,000 Units by mouth daily.   07/08/2018 at Unknown time  . diazepam (VALIUM) 5 MG tablet Take 0.5 tablets (2.5 mg total) by mouth daily as needed for anxiety or muscle spasms. 30 tablet 0 07/08/2018 at Unknown time  . Diclofenac Sodium (PENNSAID) 2 % SOLN Place 1 application onto the skin daily as needed (for back pain).   Past Month at Unknown time  . dicyclomine  (BENTYL) 10 MG capsule Take 10 mg by mouth 3 (three) times daily as needed for spasms.   Past Month at Unknown time  . DULoxetine (CYMBALTA) 60 MG capsule Take 1 capsule (60 mg total) by mouth 2 (two) times daily. 180 capsule 0 07/08/2018 at Unknown time  . fluticasone (FLONASE) 50 MCG/ACT nasal spray Place 2 sprays into the nose daily as needed for allergies.    Past Week at Unknown time  . folic acid (FOLVITE) 267 MCG tablet Take 400 mcg by mouth every evening.    07/08/2018 at Unknown time  . furosemide (LASIX) 40 MG tablet Take 40 mg by mouth daily.    07/08/2018 at Unknown time  . gabapentin (NEURONTIN) 300 MG capsule Take 300 mg by mouth at bedtime.    07/07/2018 at Unknown time  . lamoTRIgine (LAMICTAL) 200 MG tablet Take 1 tablet (200 mg total) by mouth daily. 90 tablet 0 07/07/2018 at Unknown time  . levothyroxine (SYNTHROID, LEVOTHROID) 150 MCG tablet Take 150 mcg by mouth daily before breakfast.   07/08/2018 at Unknown time  . lubiprostone (AMITIZA) 8 MCG capsule Take 8 mcg by mouth as needed for constipation.    Past Month at Unknown time  . NOVOLOG 100 UNIT/ML injection Inject 2.425-2.95 Units into the skin daily. Via pump 7pm to midnight 2.950 8:30am -7pm2.925 Midnight - 8:30 2.425  2 07/08/2018 at Unknown time  . Omega-3 Fatty Acids (FISH OIL PO) Take 1 capsule by mouth daily.   07/07/2018 at Unknown time  . omeprazole (PRILOSEC) 20 MG capsule Take 20 mg by mouth at bedtime.    07/07/2018 at Unknown time  . ondansetron (ZOFRAN ODT) 4 MG disintegrating tablet Take 1 tablet (4 mg total) by mouth every 8 (eight) hours as needed. (Patient taking differently: Take 4 mg by mouth every 8 (eight) hours as needed for nausea or vomiting. ) 20 tablet 6 07/07/2018 at Unknown time  . pravastatin (PRAVACHOL) 80 MG tablet Take 80 mg by mouth at bedtime.   07/07/2018 at Unknown time  . PROAIR HFA 108 (90 Base) MCG/ACT inhaler Inhale 1-2 puffs into the lungs every 6 (six) hours as needed for wheezing or shortness of  breath.   1 Past Week at Unknown time  . rizatriptan (MAXALT-MLT) 10 MG disintegrating tablet Take 1 tablet (10 mg total) as needed by mouth. May repeat in 2 hours if needed (Patient taking differently: Take 10 mg by mouth as needed for migraine. May repeat in 2 hours if needed) 15 tablet  11 Past Week at Unknown time    Patient Stressors: Financial difficulties Health problems Occupational concerns  Patient Strengths: Ability for insight Average or above average intelligence Communication skills General fund of knowledge Motivation for treatment/growth Supportive family/friends  Treatment Modalities: Medication Management, Group therapy, Case management,  1 to 1 session with clinician, Psychoeducation, Recreational therapy.   Physician Treatment Plan for Primary Diagnosis: Bipolar 2 disorder, major depressive episode ([place]) Long Term Goal(s): Improvement in symptoms so as ready for discharge Improvement in symptoms so as ready for discharge   Short Term Goals: Ability to identify changes in lifestyle to reduce recurrence of condition will improve Ability to verbalize feelings will improve Ability to disclose and discuss suicidal ideas Ability to identify and develop effective coping behaviors will improve Ability to identify triggers associated with substance abuse/mental health issues will improve  Medication Management: Evaluate patient's response, side effects, and tolerance of medication regimen.  Therapeutic Interventions: 1 to 1 sessions, Unit Group sessions and Medication administration.  Evaluation of Outcomes: Not Met  Physician Treatment Plan for Secondary Diagnosis: Principal Problem:   Bipolar 2 disorder, major depressive episode ([place]) Active Problems:   MDD (major depressive disorder), recurrent, severe, with psychosis ([place])  Long Term Goal(s): Improvement in symptoms so as ready for discharge Improvement in symptoms so as ready for discharge   Short Term Goals:  Ability to identify changes in lifestyle to reduce recurrence of condition will improve Ability to verbalize feelings will improve Ability to disclose and discuss suicidal ideas Ability to identify and develop effective coping behaviors will improve Ability to identify triggers associated with substance abuse/mental health issues will improve     Medication Management: Evaluate patient's response, side effects, and tolerance of medication regimen.  Therapeutic Interventions: 1 to 1 sessions, Unit Group sessions and Medication administration.  Evaluation of Outcomes: Not Met   RN Treatment Plan for Primary Diagnosis: Bipolar 2 disorder, major depressive episode ([place]) Long Term Goal(s): Knowledge of disease and therapeutic regimen to maintain health will improve  Short Term Goals: Ability to participate in decision making will improve, Ability to verbalize feelings will improve, Ability to disclose and discuss suicidal ideas, Ability to identify and develop effective coping behaviors will improve and Compliance with prescribed medications will improve  Medication Management: RN will administer medications as ordered by provider, will assess and evaluate patient's response and provide education to patient for prescribed medication. RN will report any adverse and/or side effects to prescribing provider.  Therapeutic Interventions: 1 on 1 counseling sessions, Psychoeducation, Medication administration, Evaluate responses to treatment, Monitor vital signs and CBGs as ordered, Perform/monitor CIWA, COWS, AIMS and Fall Risk screenings as ordered, Perform wound care treatments as ordered.  Evaluation of Outcomes: Not Met   LCSW Treatment Plan for Primary Diagnosis: Bipolar 2 disorder, major depressive episode ([place]) Long Term Goal(s): Safe transition to appropriate next level of care at discharge, Engage patient in therapeutic group addressing interpersonal concerns.  Short Term Goals: Engage  patient in aftercare planning with referrals and resources  Therapeutic Interventions: Assess for all discharge needs, 1 to 1 time with Social worker, Explore available resources and support systems, Assess for adequacy in community support network, Educate family and significant other(s) on suicide prevention, Complete Psychosocial Assessment, Interpersonal group therapy.  Evaluation of Outcomes: Not Met   Progress in Treatment: Attending groups: Yes. Participating in groups: Yes. Taking medication as prescribed: Yes. Toleration medication: Yes. Family/Significant other contact made: No, will contact:  the patient's husband Patient understands diagnosis:  Yes. Discussing patient identified problems/goals with staff: Yes. Medical problems stabilized or resolved: Yes. Denies suicidal/homicidal ideation: Yes. Issues/concerns per patient self-inventory: No. Other:   New problem(s) identified: None   New Short Term/Long Term Goal(s): medication stabilization, elimination of SI thoughts, development of comprehensive mental wellness plan.   Patient Goals:  Help with my anger. I wear my heart on my sleeve   Discharge Plan or Barriers: Patient plans to discharge home with her husband and father. She reports she will follow up at [facility] with Dr. Armandina Stammer for medication management services. She reports that she also receives therapy services through the same agency.   Reason for Continuation of Hospitalization: Anxiety Depression Medication stabilization Suicidal ideation  Estimated Length of Stay: 07/17/2018  Attendees: Patient: 07/12/2018 10:40 AM  Physician: Dr. Neita Garnet, MD 07/12/2018 10:40 AM  Nursing: Rise Paganini. Raliegh Ip, RN 07/12/2018 10:40 AM  RN Care Manager: 07/12/2018 10:40 AM  Social Worker: Radonna Ricker, [place] 07/12/2018 10:40 AM  Recreational Therapist:  07/12/2018 10:40 AM  Other: Harriett Sine, NP  07/12/2018 10:40 AM  Other:  07/12/2018 10:40 AM  Other: 07/12/2018 10:40 AM     Scribe for Treatment Team: [name], [place] 07/12/2018 10:40 AM

## 2018-07-12 NOTE — Progress Notes (Signed)
D:  Patient's self inventory sheet, patient sleeps good, sleep medication helpful.  Good appetite, low energy level, poor concentration.  Rated depression and hopeless 3, anxiety 2.  Denied withdrawals.  Denied SI.  Physical problems, lightheaded, dizziness.  Denied physical pain.  Goal is self esteem, learning to say.  Plans to work on Radiographer, therapeutic.  No discharge plans. A:  Medications administered per MD orders.  Emotional support and encouragement given patient. R:  Denied SI and HI, contracts for safety.  Denied A/V hallucinations.  Safety maintained with 15 minute checks.

## 2018-07-12 NOTE — Progress Notes (Signed)
[facility] MD Progress Note  07/12/2018 12:02 PM Jasmine [name]  MRN:  [id number] Subjective: Patient reports feeling better than she did on admission.  Describes her mood as a "7/10" with 10 being best.  Denies suicidal ideations.  Currently does not endorse medication side effects.  Objective: I have discussed case with treatment team and have met with patient. 48 year old female, admitted due to worsening depression, suicidal ideations, Valium overdose, also reported unintelligible auditory hallucinations, described as distant whispers.  Patient has been facing significant stressors.  She has a history of chronic medical illnesses to include diabetes mellitus type 1, chronic renal failure, hypothyroidism.  She had been on disability which she states was discontinued/stopped recently, leading to financial stress.  States she attempted to work but was let go after a week because "I simply could not do it". As above, patient reports improving mood, denies suicidal ideations, affect remains vaguely constricted although does smile briefly at times.  At this time does not endorse hallucinations and does not appear internally preoccupied.  Yesterday patient was sent to ED due to hypotension, hypoglycemia.  Today vitals are improved and where rechecked manually.  Sitting BP 139/69 pulse 72 standing BP 122/82 pulse 84.  CBG this a.m. 200.  She does complain of some dizziness, but improved compared to yesterday.  Noted to be ambulating on unit slowly but steadily (has right leg boot).    Principal Problem: Bipolar 2 disorder, major depressive episode (HCC) Diagnosis: Principal Problem:   Bipolar 2 disorder, major depressive episode ([place]) Active Problems:   MDD (major depressive disorder), recurrent, severe, with psychosis ([place])  Total Time spent with patient: 20 minutes  Past Psychiatric History:   Past Medical History:  Past Medical History:  Diagnosis Date  . Anxiety   . Bipolar disorder ([place])   .  Chronic fatigue   . CKD (chronic kidney disease), stage II   . Depression   . Diabetes mellitus   . DKA, type 1 ([place]) 11/04/2011  . Elevated cholesterol   . Fibromyalgia   . GERD (gastroesophageal reflux disease)   . Headache   . Hyperlipemia   . Hypertension   . Hypothyroidism   . IBS (irritable bowel syndrome)   . Obesity   . Sleep apnea    HAS C -PAP / DOES NOT USE  . Stress incontinence    Pt had surgery to correct this.  . Tachycardia   . Tobacco abuse   . UTI (lower urinary tract infection)     Past Surgical History:  Procedure Laterality Date  . INCONTINENCE SURGERY    . NASAL FRACTURE SURGERY    . ovary removed    . OVARY SURGERY    . PUBOVAGINAL SLING  08/16/2011   Procedure: [name];  Surgeon: Bernestine Amass, MD;  Location: [facility];  Service: Urology;  Laterality: N/A;         . UTERINE FIBROID SURGERY  2001   Family History:  Family History  Problem Relation Age of Onset  . Asthma Mother   . Bipolar disorder Mother   . Heart disease Father   . Lymphoma Father   . Hypertension Father   . Thyroid disease Father   . Hyperlipidemia Father   . Diabetes Father   . Cancer Paternal Grandmother        lung and breast  . Bladder Cancer Paternal Grandfather   . Suicidality Maternal Grandfather   . Thyroid disease Brother    Family Psychiatric  History:  Social History:  Social History   Substance and Sexual Activity  Alcohol Use No     Social History   Substance and Sexual Activity  Drug Use No    Social History   Socioeconomic History  . Marital status: Married    Spouse name: Not on file  . Number of children: 0  . Years of education: 55  . Highest education level: Not on file  Occupational History  . Occupation: Disabled  Social Needs  . Financial resource strain: Not on file  . Food insecurity:    Worry: Not on file    Inability: Not on file  . Transportation needs:    Medical: Not on file    Non-medical: Not on file   Tobacco Use  . Smoking status: Former Smoker    Packs/day: 0.75    Years: 20.00    Pack years: 15.00    Types: Cigarettes    Last attempt to quit: 06/07/2012    Years since quitting: 6.0  . Smokeless tobacco: Never Used  Substance and Sexual Activity  . Alcohol use: No  . Drug use: No  . Sexual activity: Yes    Birth control/protection: I.U.D., Post-menopausal  Lifestyle  . Physical activity:    Days per week: Not on file    Minutes per session: Not on file  . Stress: Not on file  Relationships  . Social connections:    Talks on phone: Not on file    Gets together: Not on file    Attends religious service: Not on file    Active member of club or organization: Not on file    Attends meetings of clubs or organizations: Not on file    Relationship status: Not on file  Other Topics Concern  . Not on file  Social History Narrative   Lives at home with husband.   Right-handed.   Occasional caffeine use.   Additional Social History:   Sleep: Improving  Appetite:  Fair  Current Medications: Current Facility-Administered Medications  Medication Dose Route Frequency Provider Last Rate Last Dose  . acetaminophen (TYLENOL) tablet 650 mg  650 mg Oral Q6H PRN Derrill Center, NP      . albuterol (PROVENTIL HFA;VENTOLIN HFA) 108 (90 Base) MCG/ACT inhaler 1-2 puff  1-2 puff Inhalation Q6H PRN Ethelene Hal, NP   2 puff at 07/10/18 2059  . alum & mag hydroxide-simeth (MAALOX/MYLANTA) 200-200-20 MG/5ML suspension 30 mL  30 mL Oral Q4H PRN Derrill Center, NP      . aspirin EC tablet 81 mg  81 mg Oral QHS Ethelene Hal, NP   81 mg at 07/11/18 2126  . buPROPion (WELLBUTRIN XL) 24 hr tablet 300 mg  300 mg Oral Daily Ethelene Hal, NP   300 mg at 07/11/18 2126  . cholecalciferol (VITAMIN D3) tablet 1,000 Units  1,000 Units Oral Daily Ethelene Hal, NP   1,000 Units at 07/12/18 [phone]  . diclofenac sodium (VOLTAREN) 1 % transdermal gel 1 application  1  application Topical Daily PRN Ethelene Hal, NP      . dicyclomine (BENTYL) capsule 10 mg  10 mg Oral TID PRN Ethelene Hal, NP      . Derrill Memo ON 07/13/2018] DULoxetine (CYMBALTA) DR capsule 60 mg  60 mg Oral QHS Cobos, Fernando A, MD      . folic acid (FOLVITE) tablet 1 mg  1,000 mcg Oral QPM Ethelene Hal, NP   1 mg at  07/11/18 1748  . gabapentin (NEURONTIN) capsule 300 mg  300 mg Oral QHS Ethelene Hal, NP   300 mg at 07/11/18 2126  . insulin aspart (novoLOG) injection 0-20 Units  0-20 Units Subcutaneous TID WC Nwoko, Agnes I, NP      . insulin aspart (novoLOG) injection 4 Units  4 Units Subcutaneous TID WC Nwoko, Agnes I, NP      . insulin glargine (LANTUS) injection 40 Units  40 Units Subcutaneous Q24H Regalado, Belkys A, MD   40 Units at 07/11/18 1858  . lamoTRIgine (LAMICTAL) tablet 200 mg  200 mg Oral Daily Ethelene Hal, NP   200 mg at 07/11/18 2126  . levothyroxine (SYNTHROID, LEVOTHROID) tablet 150 mcg  150 mcg Oral Q0600 Ethelene Hal, NP   150 mcg at 07/12/18 0932  . lubiprostone (AMITIZA) capsule 8 mcg  8 mcg Oral PRN Ethelene Hal, NP      . magnesium hydroxide (MILK OF MAGNESIA) suspension 30 mL  30 mL Oral Daily PRN Derrill Center, NP      . ondansetron (ZOFRAN-ODT) disintegrating tablet 4 mg  4 mg Oral Q8H PRN Ethelene Hal, NP      . pantoprazole (PROTONIX) EC tablet 40 mg  40 mg Oral Daily Ethelene Hal, NP   40 mg at 07/12/18 3557  . pravastatin (PRAVACHOL) tablet 80 mg  80 mg Oral QHS Ethelene Hal, NP   80 mg at 07/11/18 2126  . SUMAtriptan (IMITREX) tablet 50 mg  50 mg Oral PRN Ethelene Hal, NP        Lab Results:  Results for orders placed or performed during the hospital encounter of 07/10/18 (from the past 48 hour(s))  Glucose, capillary     Status: Abnormal   Collection Time: 07/10/18  5:23 PM  Result Value Ref Range   Glucose-Capillary 223 (H) 70 - 99 mg/dL  Glucose, capillary      Status: Abnormal   Collection Time: 07/10/18  8:49 PM  Result Value Ref Range   Glucose-Capillary 258 (H) 70 - 99 mg/dL  Glucose, capillary     Status: Abnormal   Collection Time: 07/11/18  5:52 AM  Result Value Ref Range   Glucose-Capillary 27 (LL) 70 - 99 mg/dL  Glucose, capillary     Status: Abnormal   Collection Time: 07/11/18  6:07 AM  Result Value Ref Range   Glucose-Capillary 48 (L) 70 - 99 mg/dL   Comment 1 Notify RN   Glucose, capillary     Status: Abnormal   Collection Time: 07/11/18  6:28 AM  Result Value Ref Range   Glucose-Capillary 53 (L) 70 - 99 mg/dL   Comment 1 Notify RN   Glucose, capillary     Status: Abnormal   Collection Time: 07/11/18  6:44 AM  Result Value Ref Range   Glucose-Capillary 113 (H) 70 - 99 mg/dL  TSH     Status: None   Collection Time: 07/11/18  6:55 AM  Result Value Ref Range   TSH 2.169 0.350 - 4.500 uIU/mL    Comment: Performed by a 3rd Generation assay with a functional sensitivity of <=0.01 uIU/mL. Performed at [facility], [place] [address] [place], [place] [address]   CBG monitoring, ED     Status: Abnormal   Collection Time: 07/11/18 11:56 AM  Result Value Ref Range   Glucose-Capillary 365 (H) 70 - 99 mg/dL  CBC with Differential     Status: None  Collection Time: 07/11/18  4:08 PM  Result Value Ref Range   WBC 6.9 4.0 - 10.5 K/uL   RBC 4.55 3.87 - 5.11 MIL/uL   Hemoglobin 12.8 12.0 - 15.0 g/dL   HCT 41.8 36.0 - 46.0 %   MCV 91.9 80.0 - 100.0 fL   MCH 28.1 26.0 - 34.0 pg   MCHC 30.6 30.0 - 36.0 g/dL   RDW 14.7 11.5 - 15.5 %   Platelets 207 150 - 400 K/uL   nRBC 0.0 0.0 - 0.2 %   Neutrophils Relative % 62 %   Neutro Abs 4.3 1.7 - 7.7 K/uL   Lymphocytes Relative 26 %   Lymphs Abs 1.8 0.7 - 4.0 K/uL   Monocytes Relative 8 %   Monocytes Absolute 0.6 0.1 - 1.0 K/uL   Eosinophils Relative 3 %   Eosinophils Absolute 0.2 0.0 - 0.5 K/uL   Basophils Relative 1 %   Basophils Absolute 0.0 0.0 - 0.1 K/uL    Immature Granulocytes 0 %   Abs Immature Granulocytes 0.02 0.00 - 0.07 K/uL    Comment: Performed at [facility], [place] [address], [place], [place] [address]  Basic metabolic panel     Status: Abnormal   Collection Time: 07/11/18  4:08 PM  Result Value Ref Range   Sodium 132 (L) 135 - 145 mmol/L   Potassium 3.9 3.5 - 5.1 mmol/L   Chloride 95 (L) 98 - 111 mmol/L   CO2 27 22 - 32 mmol/L   Glucose, Bld 394 (H) 70 - 99 mg/dL   BUN 34 (H) 6 - 20 mg/dL   Creatinine, Ser 2.57 (H) 0.44 - 1.00 mg/dL   Calcium 9.1 8.9 - 10.3 mg/dL   GFR calc non Af Amer 22 (L) >60 mL/min   GFR calc Af Amer 25 (L) >60 mL/min   Anion gap 10 5 - 15    Comment: Performed at [facility], [place] [address], [place], [place] [address]  POC CBG, ED     Status: Abnormal   Collection Time: 07/11/18  4:20 PM  Result Value Ref Range   Glucose-Capillary 370 (H) 70 - 99 mg/dL  Urinalysis, Routine w reflex microscopic     Status: Abnormal   Collection Time: 07/11/18  5:24 PM  Result Value Ref Range   Color, Urine YELLOW YELLOW   APPearance CLEAR CLEAR   Specific Gravity, Urine 1.008 1.005 - 1.030   pH 6.0 5.0 - 8.0   Glucose, UA >=500 (A) NEGATIVE mg/dL   Hgb urine dipstick NEGATIVE NEGATIVE   Bilirubin Urine NEGATIVE NEGATIVE   Ketones, ur 5 (A) NEGATIVE mg/dL   Protein, ur 30 (A) NEGATIVE mg/dL   Nitrite NEGATIVE NEGATIVE   Leukocytes, UA NEGATIVE NEGATIVE   WBC, UA 0-5 0 - 5 WBC/hpf   Bacteria, UA MANY (A) NONE SEEN   Squamous Epithelial / LPF 0-5 0 - 5    Comment: Performed at [facility], [place] [address], [place],  [address]  CBG monitoring, ED     Status: Abnormal   Collection Time: 07/11/18  6:37 PM  Result Value Ref Range   Glucose-Capillary 291 (H) 70 - 99 mg/dL  Glucose, capillary     Status: Abnormal   Collection Time: 07/11/18  8:49 PM  Result Value Ref Range   Glucose-Capillary 239 (H) 70 - 99 mg/dL   Comment 1 Notify RN   Glucose,  capillary     Status: Abnormal   Collection  Time: 07/12/18  5:37 AM  Result Value Ref Range   Glucose-Capillary 182 (H) 70 - 99 mg/dL  Glucose, capillary     Status: Abnormal   Collection Time: 07/12/18  9:33 AM  Result Value Ref Range   Glucose-Capillary 200 (H) 70 - 99 mg/dL  Glucose, capillary     Status: Abnormal   Collection Time: 07/12/18 11:55 AM  Result Value Ref Range   Glucose-Capillary 306 (H) 70 - 99 mg/dL   Comment 1 Notify RN    Comment 2 Document in Chart     Blood Alcohol level:  Lab Results  Component Value Date   ETH <10 07/08/2018   [facility]  05/05/2010    <5        LOWEST DETECTABLE LIMIT FOR SERUM ALCOHOL IS 5 mg/dL FOR MEDICAL PURPOSES ONLY    Metabolic Disorder Labs: Lab Results  Component Value Date   HGBA1C 9.6 (H) 12/25/2012   MPG 229 (H) 12/25/2012   MPG 235 (H) 09/24/2012   No results found for: PROLACTIN No results found for: CHOL, TRIG, HDL, CHOLHDL, VLDL, LDLCALC  Physical Findings: AIMS: Facial and Oral Movements Muscles of Facial Expression: None, normal Lips and Perioral Area: None, normal Jaw: None, normal Tongue: None, normal,Extremity Movements Upper (arms, wrists, hands, fingers): None, normal Lower (legs, knees, ankles, toes): None, normal, Trunk Movements Neck, shoulders, hips: None, normal, Overall Severity Severity of abnormal movements (highest score from questions above): None, normal Incapacitation due to abnormal movements: None, normal Patient's awareness of abnormal movements (rate only patient's report): No Awareness, Dental Status Current problems with teeth and/or dentures?: Yes Does patient usually wear dentures?: No  CIWA:    COWS:     Musculoskeletal: Strength & Muscle Tone: within normal limits Gait & Station: normal Patient leans: N/A  Psychiatric Specialty Exam: Physical Exam  ROS no chest pain, no shortness of breath at room air, some persistent dizziness but improved compared to yesterday  Blood  pressure 139/69, pulse 72, temperature 98.1 F (36.7 C), temperature source Oral, resp. rate 18, height 5' 7"  (1.702 m), weight (!) 137 kg, SpO2 94 %.Body mass index is 47.3 kg/m.  General Appearance: Fairly Groomed  Eye Contact:  Good  Speech:  Normal Rate  Volume:  Decreased  Mood:  Reports improvement compared to admission, describes mood as 7/10, does remain depressed although affect is more reactive  Affect:  Constricted but the becomes more reactive during session, smiles briefly/appropriately at times  Thought Process:  Linear and Descriptions of Associations: Intact  Orientation:  Full (Time, Place, and Person)  Thought Content:  Not currently internally preoccupied no delusions, at this time does not endorse auditory hallucinations  Suicidal Thoughts:  No denies current suicidal or self-injurious ideations, contracts for safety on unit  Homicidal Thoughts:  No  Memory:  Recent and remote grossly intact  Judgement:  Fair/improving  Insight:  Fair  Psychomotor Activity:  Decreased, but visible on unit  Concentration:  Concentration: Good and Attention Span: Good  Recall:  Good  Fund of Knowledge:  Good  Language:  Good  Akathisia:  Negative  Handed:  Right  AIMS (if indicated):     Assets:  Communication Skills Desire for Improvement Resilience  ADL's:  Intact  Cognition:  WNL  Sleep:  Number of Hours: 6.5   Assessment: 48 year old female, admitted due to worsening depression, suicidal ideations, Valium overdose, also reported unintelligible auditory hallucinations, described as distant whispers.  Patient has been facing significant stressors.  She has  a history of chronic medical illnesses to include diabetes mellitus type 1, chronic renal failure, hypothyroidism.  She had been on disability which she states was discontinued/stopped recently, leading to financial stress.  States she attempted to work but was let go after a week because "I simply could not do it".  Patient  presents with partially improved mood.  Describes feeling better than she did on admission.  Denies any suicidal ideations.  Future oriented and hopeful for discharge soon.  Affect remains vaguely constricted/sad but tends to improve during session.  Today does not endorse or present with any psychotic symptoms.  No hypoglycemia today.  No significant orthostasis or hypotension noted on most recent vital signs.   As patient describes some dizziness, particularly in a.m., which may be associated with recent Cymbalta titration and AM dosing , will adjust to Cymbalta 60 mg nightly.  Treatment Plan Summary: Daily contact with patient to assess and evaluate symptoms and progress in treatment, Medication management, Plan Inpatient treatment and Medications as below Encourage group and milieu participation to work on coping skills and symptom reduction Treatment team working on disposition planning options Continue Wellbutrin XL 300 mg daily for depression Continue Cymbalta 60 mgrs QHS for depression, pain Continue Neurontin 300 mgrs QHS for anxiety, pain Continue Lamictal 200 mgrs QDAY for mood disorder Continue Synthroid 150 micrograms QDAY for hypothyroidsim Continue Diabetic management . Lantus Insulin 40 units [place] daily, Novolog [place] sliding scale for meal coverage   Jenne Campus, MD 07/12/2018, 12:02 PM

## 2018-07-12 NOTE — Progress Notes (Signed)
Patient stated that she had a good day since she was visited by her pastor. In addition, she mentioned that she was visited by her husband that was both positive and negative. She admits to feeling better. Her goal for tomorrow is to work on her discharge plans.
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
## 2018-07-12 NOTE — Therapy (Signed)
Occupational Therapy Group Note  Date:  07/12/2018 Time:  11:39 AM  Group Topic/Focus:  Self Esteem Action Plan:   The focus of this group is to help patients create a plan to continue to build self-esteem after discharge.  Participation Level:  Minimal  Participation Quality:  Attentive  Affect:  Depressed and Flat  Cognitive:  Disorganized  Insight: Improving  Engagement in Group:  Limited  Modes of Intervention:  Activity, Discussion, Education and Socialization  Additional Comments:    S: No subjective offered this date  O: OT tx with focus on self esteem building this date. Education given on definition of self esteem, with both causes of low and high self esteem identified. Activity given for pt to identify a positive/aspiring trait for each letter of the alphabet. Pt to work with peers to help complete activity and build positive thinking.   A: Pt presents to group with depressed/flat affect, minimally engaged this date. Pt appears attentive throughout discussion and activity, but not willing to share or offer subjective. Pt pulled by provider, then returning to receive activity, but at end of group. Pt did not complete activity in group.  P: Education given on self esteem and how to improve this date. Handouts and activities given to help facilitate skills when reintegrating into community.   Zenovia Jarred, MSOT, OTR/L Behavioral Health OT/ Acute Relief OT PHP Office: [place] 07/12/2018, 11:39 AM

## 2018-07-13 LAB — GLUCOSE, CAPILLARY
GLUCOSE-CAPILLARY: 171 mg/dL — AB (ref 70–99)
Glucose-Capillary: 162 mg/dL — ABNORMAL HIGH (ref 70–99)

## 2018-07-13 MED ORDER — ACETAMINOPHEN 325 MG PO TABS: 650.0000 mg | ORAL_TABLET | Freq: Four times a day (QID) | ORAL | 0 refills | Status: DC | PRN

## 2018-07-13 MED ORDER — BUPROPION HCL ER (XL) 300 MG PO TB24: 300.0000 mg | ORAL_TABLET | Freq: Every day | ORAL | 0 refills | Status: DC

## 2018-07-13 MED ORDER — DULOXETINE HCL 60 MG PO CPEP: 60.0000 mg | ORAL_CAPSULE | Freq: Every day | ORAL | 0 refills | Status: DC

## 2018-07-13 MED ORDER — GABAPENTIN 300 MG PO CAPS: 300.0000 mg | ORAL_CAPSULE | Freq: Every day | ORAL | 0 refills | Status: DC

## 2018-07-13 MED ORDER — LAMOTRIGINE 200 MG PO TABS: 200.0000 mg | ORAL_TABLET | Freq: Every day | ORAL | 0 refills | Status: DC

## 2018-07-13 NOTE — [facility] Suicide Risk Assessment (Signed)
SPE completed with patient, as patient refused to consent to family contact. SPI pamphlet provided to pt and pt was encouraged to share information with support network, ask questions, and talk about any concerns relating to SPE. Patient denies access to guns/firearms and verbalized understanding of information provided. Mobile Crisis information also provided to patient.  Ahilyn Nell, MSW, LCSWA Clinical Social Worker [facility]  Phone: [phone]  

## 2018-07-13 NOTE — Progress Notes (Signed)
Discharge Note:  Patient discharged home with family member.  Patient denied SI and HI.  Denied A/V hallucinations.  Suicide prevention information given and discussed with patient who stated she understood and had no questions.  My3 app information also given to patient.  Survey given to patient to fill out.  Patient stated she received all her belongings, clothing, misc items, toiletries, prescriptions, etc.  Patient stated she appreciated all assistance received from [facility] staff.  All required discharge information given to patient at discharge.

## 2018-07-13 NOTE — Progress Notes (Signed)
  [facility] Adult Case Management Discharge Plan :  Will you be returning to the same living situation after discharge:  Yes,  patient reports she is returning home with her husband and father  At discharge, do you have transportation home?: Yes,  patient will be picked up by a family member Do you have the ability to pay for your medications: Yes,  BCBS  Release of information consent forms completed and in the chart;  Patient's signature needed at discharge.  Patient to Follow up at: Follow-up Information    [facility] Follow up on 07/27/2018.   Specialty:  Behavioral Health Why:  Medication management appointment with Dr. Armandina Stammer is Thursday, 2/20 at 9:00a. Please bring your current medications and discharge paperwork from this hospitalization.  Contact information: [address] [phone]          Next level of care provider has access to [place] and Suicide Prevention discussed: Yes,  with the patient      Has patient been referred to the Quitline?: N/A patient is not a smoker  Patient has been referred for addiction treatment: [place], [place] 07/13/2018, 2:35 PM

## 2018-07-13 NOTE — Progress Notes (Deleted)
Nursing 1:1 Note  D: Pt observed sleeping in bed with eyes closed. RR even and unlabored. No distress noted.  A: 1:1 observation continues for safety. Sitter within arms reach.  R: Pt remains safe.

## 2018-07-13 NOTE — Discharge Summary (Addendum)
Physician Discharge Summary Note  Patient:  Jasmine [name] is an 48 y.o., female MRN:  [id number] DOB:  [birth year] Patient phone:  [phone] (home)  Patient address:   [place] [address],  Total Time spent with patient: 15 minutes  Date of Admission:  07/10/2018 Date of Discharge: 07/13/2018  Reason for Admission:  Suicidal ideation  Principal Problem: Bipolar 2 disorder, major depressive episode [facility]) Discharge Diagnoses: Principal Problem:   Bipolar 2 disorder, major depressive episode ([place]) Active Problems:   MDD (major depressive disorder), recurrent, severe, with psychosis ([place])   Past Psychiatric History: Per admission H&P: Long history of depression, diagnosed with bipolar disorder. Two prior hospitalizations, with most recent ten years ago for SI. One prior suicide attempt via cutting in early 20s. Per chart review, history of periods with increased energy, activity, euphoria and excessive spending.  Past Medical History:  Past Medical History:  Diagnosis Date  . Anxiety   . Bipolar disorder ([place])   . Chronic fatigue   . CKD (chronic kidney disease), stage II   . Depression   . Diabetes mellitus   . DKA, type 1 ([place]) 11/04/2011  . Elevated cholesterol   . Fibromyalgia   . GERD (gastroesophageal reflux disease)   . Headache   . Hyperlipemia   . Hypertension   . Hypothyroidism   . IBS (irritable bowel syndrome)   . Obesity   . Sleep apnea    HAS C -PAP / DOES NOT USE  . Stress incontinence    Pt had surgery to correct this.  . Tachycardia   . Tobacco abuse   . UTI (lower urinary tract infection)     Past Surgical History:  Procedure Laterality Date  . INCONTINENCE SURGERY    . NASAL FRACTURE SURGERY    . ovary removed    . OVARY SURGERY    . PUBOVAGINAL SLING  08/16/2011   Procedure: Jasmine [name];  Surgeon: Jasmine Amass, MD;  Location: [facility];  Service: Urology;  Laterality: N/A;         . UTERINE FIBROID SURGERY  2001    Family History:  Family History  Problem Relation Age of Onset  . Asthma Mother   . Bipolar disorder Mother   . Heart disease Father   . Lymphoma Father   . Hypertension Father   . Thyroid disease Father   . Hyperlipidemia Father   . Diabetes Father   . Cancer Paternal Grandmother        lung and breast  . Bladder Cancer Paternal Grandfather   . Suicidality Maternal Grandfather   . Thyroid disease Brother    Family Psychiatric  History: Per admission H&P: Mother was hospitalized twice for depression. Maternal grandfather and uncle both died by suicide. Social History:  Social History   Substance and Sexual Activity  Alcohol Use No     Social History   Substance and Sexual Activity  Drug Use No    Social History   Socioeconomic History  . Marital status: Married    Spouse name: Not on file  . Number of children: 0  . Years of education: 13  . Highest education level: Not on file  Occupational History  . Occupation: Disabled  Social Needs  . Financial resource strain: Not on file  . Food insecurity:    Worry: Not on file    Inability: Not on file  . Transportation needs:    Medical: Not on file  Non-medical: Not on file  Tobacco Use  . Smoking status: Former Smoker    Packs/day: 0.75    Years: 20.00    Pack years: 15.00    Types: Cigarettes    Last attempt to quit: 06/07/2012    Years since quitting: 6.1  . Smokeless tobacco: Never Used  Substance and Sexual Activity  . Alcohol use: No  . Drug use: No  . Sexual activity: Yes    Birth control/protection: I.U.D., Post-menopausal  Lifestyle  . Physical activity:    Days per week: Not on file    Minutes per session: Not on file  . Stress: Not on file  Relationships  . Social connections:    Talks on phone: Not on file    Gets together: Not on file    Attends religious service: Not on file    Active member of club or organization: Not on file    Attends meetings of clubs or organizations: Not on  file    Relationship status: Not on file  Other Topics Concern  . Not on file  Social History Narrative   Lives at home with husband.   Right-handed.   Occasional caffeine use.    Hospital Course:  Per admission H&P 07/11/2018: Jasmine [name] is a 48 year old female with history of type 1 diabetes, CKD, HTN, hypothyroidism, fibrymyalgia, chronic fatigue syndrome, and bipolar II, presenting for treatment of suicidal ideation with thoughts of cutting herself. She reports financial stress as major contributor- her disability payments were cut off in December. Since then she reports filling out ~951 job applications. 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 She also reports relationship issues with her husband, who recently caught her with her boyfriend; she and husband are working to restore marriage. She reports worsening depression with fatigue, insomnia, guilt, impaired focus, and suicidal thoughts to cut self over the last 2 months. Also reports intermittent AH for years of 2 people whispering, last heard 3-4 days ago. She is currently taking Lamictal 200 mg daily, Cymbalta 60 mg BID, Wellbutrin XL 300 daily, gabapentin 300 mg QHS, and Valium 2.5 mg PRN (reports taking 2-3x/week). Reports med compliance. She uses an insulin pump at home. Denies ETOH and substance use. BAL<10. UDS positive for BZDs only. Patient's CBG was 27 this morning (restored to 113 with PO glucose gel and juice), and bp 90/50, hr 81. She reports feeling dizzy while standing. Hospitalist recommends she be sent to [facility] for evaluation.  Jasmine [name] was admitted for suicidal ideation. She was sent to ED on 07/11/18 for CBG of 27 and low blood pressure. She received IVF, and adjustments to inpatient insulin were made (uses an insulin pump at home), and Coreg and Lasix were discontinued. Patient returned to [facility] on 07/11/18. Lamictal, Wellbutrin, gabapentin, and  Cymbalta were continued. PRN Valium was discontinued. Jasmine [name] remained on [facility] unit for 3 days. She stabilized with medication and therapy. She was discharged on the medications listed below. She has shown improvement with improved mood, affect, sleep, appetite, and interaction. She denies any SI/HI/AVH and contracts for safety. She agrees to follow up at [facility]. She is provided with prescriptions for medications upon discharge.  Physical Findings: AIMS: Facial and Oral Movements Muscles of Facial Expression: None, normal Lips and Perioral Area: None, normal Jaw: None, normal Tongue: None, normal,Extremity Movements Upper (arms, wrists, hands, fingers):  None, normal Lower (legs, knees, ankles, toes): None, normal, Trunk Movements Neck, shoulders, hips: None, normal, Overall Severity Severity of abnormal movements (highest score from questions above): None, normal Incapacitation due to abnormal movements: None, normal Patient's awareness of abnormal movements (rate only patient's report): No Awareness, Dental Status Current problems with teeth and/or dentures?: Yes Does patient usually wear dentures?: No  CIWA:  CIWA-Ar Total: 1 COWS:  COWS Total Score: 1  Musculoskeletal: Strength & Muscle Tone: within normal limits Gait & Station: normal Patient leans: N/A  Psychiatric Specialty Exam: Physical Exam  Nursing note and vitals reviewed. Constitutional: She is oriented to person, place, and time. She appears well-developed and well-nourished.  Cardiovascular: Normal rate.  Respiratory: Effort normal.  Neurological: She is alert and oriented to person, place, and time.    Review of Systems  Constitutional: Negative.   Psychiatric/Behavioral: Positive for depression (improving). Negative for hallucinations, memory loss, substance abuse and suicidal ideas. The patient is not nervous/anxious and does not have insomnia.     Blood pressure (!) 102/57, pulse 87,  temperature 98.7 F (37.1 C), temperature source Oral, resp. rate 18, height 5\' 7"  (1.702 m), weight (!) 137 kg, SpO2 94 %.Body mass index is 47.3 kg/m.  See MD's discharge SRA       Has this patient used any form of tobacco in the last 30 days? (Cigarettes, Smokeless Tobacco, Cigars, and/or Pipes)  No  Blood Alcohol level:  Lab Results  Component Value Date   ETH <10 07/08/2018   [facility]  05/05/2010    <5        LOWEST DETECTABLE LIMIT FOR SERUM ALCOHOL IS 5 mg/dL FOR MEDICAL PURPOSES ONLY    Metabolic Disorder Labs:  Lab Results  Component Value Date   HGBA1C 9.6 (H) 12/25/2012   MPG 229 (H) 12/25/2012   MPG 235 (H) 09/24/2012   No results found for: PROLACTIN No results found for: CHOL, TRIG, HDL, CHOLHDL, VLDL, LDLCALC  See Psychiatric Specialty Exam and Suicide Risk Assessment completed by Attending Physician prior to discharge.  Discharge destination:  Home  Is patient on multiple antipsychotic therapies at discharge:  No   Has Patient had three or more failed trials of antipsychotic monotherapy by history:  No  Recommended Plan for Multiple Antipsychotic Therapies: NA  Discharge Instructions    Discharge instructions   Complete by:  As directed    Patient is instructed to take all prescribed medications as recommended. Report any side effects or adverse reactions to your outpatient psychiatrist. Patient is instructed to abstain from alcohol and illegal drugs while on prescription medications. In the event of worsening symptoms, patient is instructed to call the crisis hotline, 911, or go to the nearest emergency department for evaluation and treatment.     Allergies as of 07/13/2018      Reactions   Ciprofloxacin Swelling, Other (See Comments)   Per pt caused lips swell and nauseous feeling   Levaquin [levofloxacin] Swelling, Other (See Comments)   Per pt caused lips swell and nauseous feeling   Buspar [buspirone] Other (See Comments)   abd cramping    Linaclotide Other (See Comments)   Advair Diskus [fluticasone-salmeterol] Other (See Comments)   Thrush    Biaxin [clarithromycin] Rash   Hydroxyzine Palpitations      Medication List    STOP taking these medications   carvedilol 25 MG tablet Commonly known as:  COREG   diazepam 5 MG tablet Commonly known as:  VALIUM   furosemide 40  MG tablet Commonly known as:  LASIX     TAKE these medications     Indication  acetaminophen 325 MG tablet Commonly known as:  TYLENOL Take 2 tablets (650 mg total) by mouth every 6 (six) hours as needed for mild pain. (May buy over the counter) What changed:    medication strength  how much to take  reasons to take this  additional instructions  Indication:  Pain   aspirin EC 81 MG tablet Take 81 mg by mouth at bedtime.  Indication:  Antiplatelet   buPROPion 300 MG 24 hr tablet Commonly known as:  WELLBUTRIN XL Take 1 tablet (300 mg total) by mouth daily. For mood What changed:  additional instructions  Indication:  Mood   cholecalciferol 1000 units tablet Commonly known as:  VITAMIN D Take 1,000 Units by mouth daily.  Indication:  Supplementation   dicyclomine 10 MG capsule Commonly known as:  BENTYL Take 10 mg by mouth 3 (three) times daily as needed for spasms.  Indication:  Irritable Bowel Syndrome   DULoxetine 60 MG capsule Commonly known as:  CYMBALTA Take 1 capsule (60 mg total) by mouth at bedtime. For mood What changed:    when to take this  additional instructions  Indication:  Mood   FISH OIL PO Take 1 capsule by mouth daily.  Indication:  Supplementation   fluticasone 50 MCG/ACT nasal spray Commonly known as:  FLONASE Place 2 sprays into the nose daily as needed for allergies.  Indication:  Allergic Rhinitis   folic acid 937 MCG tablet Commonly known as:  FOLVITE Take 400 mcg by mouth every evening.  Indication:  Anemia From Inadequate Folic Acid   gabapentin 300 MG capsule Commonly known as:   NEURONTIN Take 1 capsule (300 mg total) by mouth at bedtime. For neuropathy What changed:  additional instructions  Indication:  Diabetes with Nerve Disease   lamoTRIgine 200 MG tablet Commonly known as:  LAMICTAL Take 1 tablet (200 mg total) by mouth daily. For mood What changed:  additional instructions  Indication:  Mood   levothyroxine 150 MCG tablet Commonly known as:  SYNTHROID, LEVOTHROID Take 150 mcg by mouth daily before breakfast.  Indication:  Underactive Thyroid   lubiprostone 8 MCG capsule Commonly known as:  AMITIZA Take 8 mcg by mouth as needed for constipation.  Indication:  Constipation caused by Irritable Bowel Syndrome   NOVOLOG 100 UNIT/ML injection Generic drug:  insulin aspart Inject 2.425-2.95 Units into the skin daily. Via pump 7pm to midnight 2.950 8:30am -7pm2.925 Midnight - 8:30 2.425  Indication:  Insulin-Dependent Diabetes   omeprazole 20 MG capsule Commonly known as:  PRILOSEC Take 20 mg by mouth at bedtime.  Indication:  Gastroesophageal Reflux Disease   ondansetron 4 MG disintegrating tablet Commonly known as:  ZOFRAN ODT Take 1 tablet (4 mg total) by mouth every 8 (eight) hours as needed. What changed:  reasons to take this  Indication:  Nausea and Vomiting   PENNSAID 2 % Soln Generic drug:  Diclofenac Sodium Place 1 application onto the skin daily as needed (for back pain).  Indication:  Pain   pravastatin 80 MG tablet Commonly known as:  PRAVACHOL Take 80 mg by mouth at bedtime.  Indication:  High Amount of Fats in the Blood   PROAIR HFA 108 (90 Base) MCG/ACT inhaler Generic drug:  albuterol Inhale 1-2 puffs into the lungs every 6 (six) hours as needed for wheezing or shortness of breath.  Indication:  Shortness of breath  rizatriptan 10 MG disintegrating tablet Commonly known as:  MAXALT-MLT Take 1 tablet (10 mg total) as needed by mouth. May repeat in 2 hours if needed What changed:  reasons to take this  Indication:   Migraine Headache      Follow-up Information    [facility] Follow up on 07/27/2018.   Specialty:  Behavioral Health Why:  Medication management appointment with Dr. Armandina Stammer is Thursday, 2/20 at 9:00a. Please bring your current medications and discharge paperwork from this hospitalization.  Contact information: [address] [place] [phone]          Follow-up recommendations: Follow up with primary care provider for blood pressure management. Follow up with primary care and endocrinologist for diabetes management. Activity as tolerated. Diet as recommended by primary care physician. Keep all scheduled follow-up appointments as recommended.   Comments:   Patient is instructed to take all prescribed medications as recommended. Report any side effects or adverse reactions to your outpatient psychiatrist. Patient is instructed to abstain from alcohol and illegal drugs while on prescription medications. In the event of worsening symptoms, patient is instructed to call the crisis hotline, 911, or go to the nearest emergency department for evaluation and treatment.  Signed: Connye Burkitt, NP 07/13/2018, 10:33 AM   Patient seen, Suicide Assessment Completed.  Disposition Plan Reviewed

## 2018-07-13 NOTE — Progress Notes (Signed)
D:  Patient's self inventory sheet, patient has fair sleep, no sleep medication.  Good appetite, normal energy level, good concentration.  Rated depression and anxiety 1.  Denied hopeless.  Denied withdrawals.  Denied SI.  Physical pain, broken foot, worst pain #6 in past 24 hours.  No pain medicine.  Goal boundaries, stress.  Plans to learn coping skills.  Does have discharge plans. A:  Medications administered per MD orders.  Emotional support and encouragement given patient. R:  Denied SI and HI, contracts for safety.  Denied A/V hallucinations.  Safety maintained with 15 minute checks.

## 2018-07-13 NOTE — BHH Suicide Risk Assessment (Signed)
[facility] Discharge Suicide Risk Assessment   Principal Problem: Bipolar 2 disorder, major depressive episode ([place]) Discharge Diagnoses: Principal Problem:   Bipolar 2 disorder, major depressive episode ([place]) Active Problems:   MDD (major depressive disorder), recurrent, severe, with psychosis ([place])   Total Time spent with patient: 30 minutes  Musculoskeletal: Strength & Muscle Tone: within normal limits Gait & Station: normal Patient leans: N/A  Psychiatric Specialty Exam: ROS reports mild headache, no chest pain, no shortness of breath, no vomiting    Blood pressure (!) 102/57, pulse 87, temperature 98.7 F (37.1 C), temperature source Oral, resp. rate 18, height 5\' 7"  (1.702 m), weight (!) 137 kg, SpO2 94 %.Body mass index is 47.3 kg/m.  General Appearance: Well Groomed  Engineer, water::  Good  Speech:  Normal Rate409  Volume:  Normal  Mood:  improving mood , states " I feel a lot better", and currently describes mood as 8/10 with 10 being best   Affect:  Appropriate and reactive   Thought Process:  Linear and Descriptions of Associations: Intact  Orientation:  Full (Time, Place, and Person)  Thought Content:  no hallucinations, no delusions, not internally preoccupied - states intermittent hallucinations she had been experiencing prior to admission have resolved.   Suicidal Thoughts:  No denies suicidal or self injurious ideations , denies homicidal ideations  Homicidal Thoughts:  No  Memory:  recent and remote grossly intact   Judgement:  Fair  Insight:  Fair  Psychomotor Activity:  Normal  Concentration:  Good  Recall:  Good  Fund of Knowledge:Good  Language: Good  Akathisia:  Negative  Handed:  Right  AIMS (if indicated):     Assets:  Communication Skills Desire for Improvement Resilience  Sleep:  Number of Hours: 6  Cognition: WNL  ADL's:  Intact   Mental Status Per Nursing Assessment::   On Admission:  Suicidal ideation indicated by patient, Suicide plan,  Self-harm thoughts, Intention to act on suicide plan, Plan includes specific time, place, or method, Self-harm behaviors, Belief that plan would result in death  Demographic Factors:  18, no children, married .   Loss Factors: Reports she lost disability benefits recently, and states she attempted to work but was unable to work ,states she could not handle work pace due to fatigue and pain  Historical Factors: History of psychiatric admissions, last time 10 years ago, has been diagnosed with Bipolar Disorder in the past, history of self cutting. History of chronic medical illnesses-  DM, Chronic Renal Insufficiency .   Risk Reduction Factors:   Living with another person, especially a relative, Positive coping skills or problem solving skills and states she is involved with her church and has a good support system there  Continued Clinical Symptoms:  At this time patient is alert, attentive, well related, describes feeling significantly better than she did at admission, and describes mood as 8/10 at this time, affect becoming more reactive/appropriate. No thought disorder, no suicidal or self injurious ideations , no homicidal or violent ideations. No homicidal or violent ideations. Future oriented , states she has re-filed for disability, and is hopeful she will be reinstated. With her express consent I spoke with her husband via phone, who has visited her on unit, and who corroborates patient is better and agrees with discharge today. Behavior on unit calm and in good control. Denies medication side effects. Side effects discussed, including risk of seizures on Wellbutrin , risk of severe rash on Lamictal   Cognitive Features That Contribute  To Risk:  No gross cognitive deficits noted upon discharge. Is alert , attentive, and oriented x 3   Suicide Risk:  Mild:  Suicidal ideation of limited frequency, intensity, duration, and specificity.  There are no identifiable plans, no associated  intent, mild dysphoria and related symptoms, good self-control (both objective and subjective assessment), few other risk factors, and identifiable protective factors, including available and accessible social support.  Follow-up Information    [facility] Follow up on 07/27/2018.   Specialty:  Behavioral Health Why:  Medication management appointment with Dr. Armandina Stammer is Thursday, 2/20 at 9:00a. Please bring your current medications and discharge paperwork from this hospitalization.  Contact information: [address] [place] [phone]          Plan Of Care/Follow-up recommendations:  Activity:  as tolerated  Diet:  diabetic diet  Tests:  ]NA Other:  See below  Patient is expressing readiness for discharge, and there are no current grounds for involuntary commitment . Leaving unit in good spirits. Plans to return home Plans to follow up as above  Patient has established endocrinologist for DM care , Dr. [name], and a PCP, Dr. Deatra Ina, for medical issues as needed .She also plans to follow up with Orthopedist for foot fracture.  Jenne Campus, MD 07/13/2018, 10:11 AM
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
## 2018-07-26 DIAGNOSIS — Z0289 Encounter for other administrative examinations: Secondary | ICD-10-CM

## 2018-07-27 ENCOUNTER — Ambulatory Visit (INDEPENDENT_AMBULATORY_CARE_PROVIDER_SITE_OTHER): Payer: BLUE CROSS/BLUE SHIELD | Admitting: Psychiatry

## 2018-07-27 ENCOUNTER — Encounter (HOSPITAL_COMMUNITY): Payer: Self-pay | Admitting: Psychiatry

## 2018-07-27 VITALS — BP 115/74 | HR 81 | Ht 67.0 in | Wt 302.0 lb

## 2018-07-27 DIAGNOSIS — F3181 Bipolar II disorder: Secondary | ICD-10-CM | POA: Diagnosis not present

## 2018-07-27 MED ORDER — DULOXETINE HCL 60 MG PO CPEP: 60.0000 mg | ORAL_CAPSULE | Freq: Every day | ORAL | 0 refills | Status: DC

## 2018-07-27 MED ORDER — LAMOTRIGINE 200 MG PO TABS: 200.0000 mg | ORAL_TABLET | Freq: Every day | ORAL | 0 refills | Status: DC

## 2018-07-27 MED ORDER — CLONAZEPAM 0.5 MG PO TABS: 0.5000 mg | ORAL_TABLET | Freq: Two times a day (BID) | ORAL | 0 refills | Status: DC | PRN

## 2018-07-27 MED ORDER — BUPROPION HCL ER (XL) 300 MG PO TB24: 300.0000 mg | ORAL_TABLET | Freq: Every day | ORAL | 0 refills | Status: DC

## 2018-07-27 NOTE — Patient Instructions (Signed)
1. Continue lamotrigine 200 mg daily  2.Continue duloxetine 60 mg twice a day  3.ContinueWellbutrin300 mg daily 4. Discontinue valium 5. Start clonazepam 0.5 mg twice a day as needed for anxiety  6. Return to clinic in one month for 30 mins or sooner as needed

## 2018-08-08 ENCOUNTER — Telehealth (HOSPITAL_COMMUNITY): Payer: Self-pay | Admitting: Psychiatry

## 2018-08-15 ENCOUNTER — Telehealth (HOSPITAL_COMMUNITY): Payer: Self-pay | Admitting: Psychiatry

## 2018-08-15 NOTE — Telephone Encounter (Signed)
D:  Dr. Modesta Messing had previously referred pt to [place].  Pt returned writer's call from 08-08-18.  A:  Oriented pt.  Pt will start on 08-31-18.  Writer approved for pt to attend three days a week d/t transportation issues.  Inform Dr. Modesta Messing.  R:  Pt receptive.
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
## 2018-08-28 ENCOUNTER — Other Ambulatory Visit: Payer: Self-pay

## 2018-08-28 ENCOUNTER — Encounter (HOSPITAL_COMMUNITY): Payer: Self-pay | Admitting: Psychiatry

## 2018-08-28 ENCOUNTER — Encounter (HOSPITAL_COMMUNITY): Payer: Self-pay | Admitting: Emergency Medicine

## 2018-08-28 ENCOUNTER — Emergency Department (EMERGENCY_DEPARTMENT_HOSPITAL)
Admission: EM | Admit: 2018-08-28 | Discharge: 2018-08-30 | Disposition: A | Discharge status: Home / Self Care | Payer: BLUE CROSS/BLUE SHIELD | Source: Home / Self Care | Attending: Emergency Medicine | Admitting: Emergency Medicine

## 2018-08-28 ENCOUNTER — Ambulatory Visit (INDEPENDENT_AMBULATORY_CARE_PROVIDER_SITE_OTHER): Payer: BLUE CROSS/BLUE SHIELD | Admitting: Psychiatry

## 2018-08-28 VITALS — BP 156/86 | HR 74 | Ht 67.0 in | Wt 297.0 lb

## 2018-08-28 DIAGNOSIS — Z818 Family history of other mental and behavioral disorders: Secondary | ICD-10-CM

## 2018-08-28 DIAGNOSIS — I129 Hypertensive chronic kidney disease with stage 1 through stage 4 chronic kidney disease, or unspecified chronic kidney disease: Secondary | ICD-10-CM | POA: Insufficient documentation

## 2018-08-28 DIAGNOSIS — Z90721 Acquired absence of ovaries, unilateral: Secondary | ICD-10-CM

## 2018-08-28 DIAGNOSIS — F430 Acute stress reaction: Secondary | ICD-10-CM | POA: Diagnosis present

## 2018-08-28 DIAGNOSIS — F3181 Bipolar II disorder: Secondary | ICD-10-CM

## 2018-08-28 DIAGNOSIS — R450 Nervousness: Secondary | ICD-10-CM

## 2018-08-28 DIAGNOSIS — K589 Irritable bowel syndrome without diarrhea: Secondary | ICD-10-CM | POA: Diagnosis present

## 2018-08-28 DIAGNOSIS — Z7982 Long term (current) use of aspirin: Secondary | ICD-10-CM | POA: Insufficient documentation

## 2018-08-28 DIAGNOSIS — K219 Gastro-esophageal reflux disease without esophagitis: Secondary | ICD-10-CM | POA: Diagnosis present

## 2018-08-28 DIAGNOSIS — Z87891 Personal history of nicotine dependence: Secondary | ICD-10-CM

## 2018-08-28 DIAGNOSIS — F411 Generalized anxiety disorder: Secondary | ICD-10-CM | POA: Diagnosis present

## 2018-08-28 DIAGNOSIS — E1022 Type 1 diabetes mellitus with diabetic chronic kidney disease: Secondary | ICD-10-CM | POA: Insufficient documentation

## 2018-08-28 DIAGNOSIS — E039 Hypothyroidism, unspecified: Secondary | ICD-10-CM

## 2018-08-28 DIAGNOSIS — Z79890 Hormone replacement therapy (postmenopausal): Secondary | ICD-10-CM

## 2018-08-28 DIAGNOSIS — Z833 Family history of diabetes mellitus: Secondary | ICD-10-CM

## 2018-08-28 DIAGNOSIS — R45851 Suicidal ideations: Secondary | ICD-10-CM | POA: Diagnosis present

## 2018-08-28 DIAGNOSIS — Z883 Allergy status to other anti-infective agents status: Secondary | ICD-10-CM

## 2018-08-28 DIAGNOSIS — N184 Chronic kidney disease, stage 4 (severe): Secondary | ICD-10-CM | POA: Insufficient documentation

## 2018-08-28 DIAGNOSIS — Z79899 Other long term (current) drug therapy: Secondary | ICD-10-CM

## 2018-08-28 DIAGNOSIS — Z915 Personal history of self-harm: Secondary | ICD-10-CM

## 2018-08-28 DIAGNOSIS — Z79891 Long term (current) use of opiate analgesic: Secondary | ICD-10-CM

## 2018-08-28 DIAGNOSIS — F332 Major depressive disorder, recurrent severe without psychotic features: Secondary | ICD-10-CM | POA: Diagnosis not present

## 2018-08-28 DIAGNOSIS — Z7951 Long term (current) use of inhaled steroids: Secondary | ICD-10-CM

## 2018-08-28 DIAGNOSIS — Z8249 Family history of ischemic heart disease and other diseases of the circulatory system: Secondary | ICD-10-CM

## 2018-08-28 DIAGNOSIS — R739 Hyperglycemia, unspecified: Secondary | ICD-10-CM

## 2018-08-28 DIAGNOSIS — M797 Fibromyalgia: Secondary | ICD-10-CM | POA: Diagnosis present

## 2018-08-28 DIAGNOSIS — Z794 Long term (current) use of insulin: Secondary | ICD-10-CM

## 2018-08-28 DIAGNOSIS — G4730 Sleep apnea, unspecified: Secondary | ICD-10-CM | POA: Diagnosis present

## 2018-08-28 DIAGNOSIS — E785 Hyperlipidemia, unspecified: Secondary | ICD-10-CM | POA: Diagnosis present

## 2018-08-28 DIAGNOSIS — E1065 Type 1 diabetes mellitus with hyperglycemia: Secondary | ICD-10-CM

## 2018-08-28 DIAGNOSIS — N182 Chronic kidney disease, stage 2 (mild): Secondary | ICD-10-CM | POA: Diagnosis present

## 2018-08-28 DIAGNOSIS — Z888 Allergy status to other drugs, medicaments and biological substances status: Secondary | ICD-10-CM

## 2018-08-28 DIAGNOSIS — Z881 Allergy status to other antibiotic agents status: Secondary | ICD-10-CM

## 2018-08-28 DIAGNOSIS — G4700 Insomnia, unspecified: Secondary | ICD-10-CM | POA: Diagnosis present

## 2018-08-28 LAB — URINALYSIS, ROUTINE W REFLEX MICROSCOPIC
Bilirubin Urine: NEGATIVE
Glucose, UA: >=500 mg/dL — AB
Hgb urine dipstick: NEGATIVE
Ketones, ur: 20 mg/dL — AB
LEUKOCYTE UA: NEGATIVE
Nitrite: NEGATIVE
Protein, ur: 100 mg/dL — AB
Specific Gravity, Urine: 1.011 (ref 1.005–1.030)
pH: 6 (ref 5.0–8.0)

## 2018-08-28 LAB — RAPID URINE DRUG SCREEN, HOSP PERFORMED
Amphetamines: NOT DETECTED
Barbiturates: NOT DETECTED
Benzodiazepines: POSITIVE — AB
Cocaine: NOT DETECTED
Opiates: NOT DETECTED
Tetrahydrocannabinol: NOT DETECTED

## 2018-08-28 LAB — COMPREHENSIVE METABOLIC PANEL
ALBUMIN: 3.6 g/dL (ref 3.5–5.0)
ALT: 14 U/L (ref 0–44)
AST: 15 U/L (ref 15–41)
Alkaline Phosphatase: 115 U/L (ref 38–126)
Anion gap: 15 (ref 5–15)
BUN: 25 mg/dL — ABNORMAL HIGH (ref 6–20)
CO2: 22 mmol/L (ref 22–32)
Calcium: 9.2 mg/dL (ref 8.9–10.3)
Chloride: 96 mmol/L — ABNORMAL LOW (ref 98–111)
Creatinine, Ser: 2.06 mg/dL — ABNORMAL HIGH (ref 0.44–1.00)
GFR calc Af Amer: 33 mL/min — ABNORMAL LOW (ref >60)
GFR calc non Af Amer: 28 mL/min — ABNORMAL LOW (ref >60)
Glucose, Bld: 517 mg/dL (ref 70–99)
POTASSIUM: 4.8 mmol/L (ref 3.5–5.1)
Sodium: 133 mmol/L — ABNORMAL LOW (ref 135–145)
Total Bilirubin: 0.8 mg/dL (ref 0.3–1.2)
Total Protein: 7 g/dL (ref 6.5–8.1)

## 2018-08-28 LAB — CBG MONITORING, ED: Glucose-Capillary: 456 mg/dL — ABNORMAL HIGH (ref 70–99)

## 2018-08-28 LAB — CBC WITH DIFFERENTIAL/PLATELET
Abs Immature Granulocytes: 0.01 10*3/uL (ref 0.00–0.07)
BASOS ABS: 0.1 10*3/uL (ref 0.0–0.1)
Basophils Relative: 1 %
Eosinophils Absolute: 0.4 10*3/uL (ref 0.0–0.5)
Eosinophils Relative: 4 %
HCT: 42.7 % (ref 36.0–46.0)
Hemoglobin: 13.2 g/dL (ref 12.0–15.0)
IMMATURE GRANULOCYTES: 0 %
Lymphocytes Relative: 20 %
Lymphs Abs: 1.7 10*3/uL (ref 0.7–4.0)
MCH: 27.9 pg (ref 26.0–34.0)
MCHC: 30.9 g/dL (ref 30.0–36.0)
MCV: 90.3 fL (ref 80.0–100.0)
Monocytes Absolute: 0.8 10*3/uL (ref 0.1–1.0)
Monocytes Relative: 9 %
NRBC: 0 % (ref 0.0–0.2)
Neutro Abs: 5.7 10*3/uL (ref 1.7–7.7)
Neutrophils Relative %: 66 %
Platelets: 239 10*3/uL (ref 150–400)
RBC: 4.73 MIL/uL (ref 3.87–5.11)
RDW: 14.5 % (ref 11.5–15.5)
WBC: 8.6 10*3/uL (ref 4.0–10.5)

## 2018-08-28 LAB — ETHANOL: Alcohol, Ethyl (B): <10 mg/dL (ref <10)

## 2018-08-28 LAB — PREGNANCY, URINE: Preg Test, Ur: NEGATIVE

## 2018-08-28 MED ORDER — SODIUM CHLORIDE 0.9 % IV BOLUS
1000.0000 mL | Freq: Once | INTRAVENOUS | Status: AC
  Administered 2018-08-28: 1000 mL via INTRAVENOUS

## 2018-08-28 MED ORDER — QUETIAPINE FUMARATE 25 MG PO TABS: 25.0000 mg | ORAL_TABLET | Freq: Every day | ORAL | 0 refills | Status: DC

## 2018-08-28 MED ORDER — BUPROPION HCL ER (XL) 300 MG PO TB24: 300.0000 mg | ORAL_TABLET | Freq: Every day | ORAL | 0 refills | Status: DC

## 2018-08-28 MED ORDER — INSULIN ASPART 100 UNIT/ML IV SOLN
10.0000 [IU] | Freq: Once | INTRAVENOUS | Status: AC
  Administered 2018-08-28: 10 [IU] via INTRAVENOUS

## 2018-08-28 MED ORDER — INSULIN ASPART 100 UNIT/ML ~~LOC~~ SOLN
SUBCUTANEOUS | Status: AC
  Filled 2018-08-28: qty 1

## 2018-08-28 MED ORDER — LAMOTRIGINE 200 MG PO TABS: 200.0000 mg | ORAL_TABLET | Freq: Every day | ORAL | 0 refills | Status: DC

## 2018-08-28 MED ORDER — CLONAZEPAM 0.5 MG PO TABS: 0.5000 mg | ORAL_TABLET | Freq: Two times a day (BID) | ORAL | 0 refills | Status: DC | PRN

## 2018-08-28 MED ORDER — DULOXETINE HCL 60 MG PO CPEP: 60.0000 mg | ORAL_CAPSULE | Freq: Every day | ORAL | 0 refills | Status: DC

## 2018-08-28 NOTE — ED Notes (Signed)
CRITICAL VALUE ALERT  Critical Value: Glucose 517  Date & Time Notied:  08/28/2018 2320  Provider Notified: Lily Kocher, PA  Orders Received/Actions taken: n/a

## 2018-08-28 NOTE — Patient Instructions (Addendum)
1. Continue lamotrigine 200 mg daily  2.Continue duloxetine 60 mg daily  3.Continue bupropion300 mg daily 4. Start quetiapine 25 mg at night  5. Continue clonazepam 0.5 mg twice a day as needed for anxiety  6. Return to clinic in one month for 30 mins

## 2018-08-28 NOTE — ED Notes (Signed)
Patient was wanded by security

## 2018-08-28 NOTE — ED Triage Notes (Signed)
Pt c/o of high blood sugar, CBG 456 in triage. Pt gave herself 12 units of insulin via her pump. Pt states "she does not want to live anymore." States she been SI on and off for weeks, was in [facility] in February 2020. Started feeling SI yesterday, feels "as if she is a burden."

## 2018-08-28 NOTE — ED Notes (Signed)
TTS speaking with pt.  

## 2018-08-28 NOTE — ED Provider Notes (Signed)
[facility] EMERGENCY DEPARTMENT Provider Note   CSN: [id number] Arrival date & time: 08/28/18  2102    History   Chief Complaint Chief Complaint  Patient presents with  . Hyperglycemia  . SI    HPI Jasmine [name] is a 48 y.o. female.     Patient is a 48 year old female who presents to the emergency department with a complaint of a high blood sugar and suicidal ideations.  The patient states that her sugars have been running in the 200s and lower until today.  She found out approximately 6 PM tonight that her tubing was broken.  She kept noticing that her sugars were going up during the day.  She presents to the emergency department to request assistance with recovery gaining control of her hyperglycemia.  She reports some increase in thirst and some increased frequency of urination, but it is similar to her usual pattern.  Is been no excessive weakness, but the patient has felt more depressed than usual.  She denies blurred vision. The patient states she has a history of bipolar disorder.  She has been treated for problems with depression in the past.  She has had suicidal thoughts for approximately 10 years.  She required hospitalization approximately 10 years ago, and again in February 2020.  She says she is upset that she has no income, and no job.  She feels that she cannot get disability benefits that she feels she deserves, and she feels that she is useless.  She has a plan in which she would cut her wrist. Pt states she ask her husband to bring her to the ED. Patient states she is been married for approximately 6 years, she has no children.  She has been applying for disability over the past 5years.  It is of note that she saw her psychiatrist today.  The psychiatrist wants her to be seen as an outpatient.  The patient states that she cannot afford a trip to [place], and she cannot afford the copayment for each visit.  She states that she is made the psychiatrist aware of  this.  She says this depresses her as well.  The history is provided by the patient.    Past Medical History:  Diagnosis Date  . Anxiety   . Bipolar disorder ([place])   . Chronic fatigue   . CKD (chronic kidney disease), stage II   . Depression   . Diabetes mellitus   . DKA, type 1 ([place]) 11/04/2011  . Elevated cholesterol   . Fibromyalgia   . GERD (gastroesophageal reflux disease)   . Headache   . Hyperlipemia   . Hypertension   . Hypothyroidism   . IBS (irritable bowel syndrome)   . Obesity   . Sleep apnea    HAS C -PAP / DOES NOT USE  . Stress incontinence    Pt had surgery to correct this.  . Tachycardia   . Tobacco abuse   . UTI (lower urinary tract infection)     Patient Active Problem List   Diagnosis Date Noted  . Bipolar 2 disorder, major depressive episode ([place]) 07/10/2018  . MDD (major depressive disorder), recurrent, severe, with psychosis ([place]) 07/10/2018  . CKD (chronic kidney disease), stage IV ([place]) 07/01/2018  . Transient Hypoglycemia 07/01/2018  . Chronic migraine 01/17/2017  . Diabetic peripheral neuropathy ([place]) 01/17/2017  . OSA (obstructive sleep apnea) 07/27/2016  . Wheezing 07/27/2016  . Hyperglycemia 12/25/2012  . Acute on chronic renal failure ([place]) 12/25/2012  .  Pulmonary infiltrates 10/02/2012  . Postnasal drip 10/02/2012  . Hyperkalemia 09/25/2012  . Morbid obesity ([place]) 09/24/2012  . HTN (hypertension) 09/24/2012  . Bipolar II disorder ([place]) 09/24/2012  . URI (upper respiratory infection) 09/24/2012  . DKA, type 1 ([place]) 11/04/2011  . Gastroenteritis 11/03/2011  . DM (diabetes mellitus), type 1, uncontrolled ([place]) 11/03/2011  . Hyponatremia 11/03/2011  . Elevated lipase 11/03/2011  . Hypothyroidism 11/03/2011  . Tobacco abuse 11/03/2011  . SUI (stress urinary incontinence, female) 08/16/2011    Past Surgical History:  Procedure Laterality Date  . INCONTINENCE SURGERY    . NASAL FRACTURE SURGERY    . ovary removed    . OVARY  SURGERY    . PUBOVAGINAL SLING  08/16/2011   Procedure: [name];  Surgeon: Bernestine Amass, MD;  Location: [facility];  Service: Urology;  Laterality: N/A;         . UTERINE FIBROID SURGERY  2001     OB History    Gravida  1   Para      Term      Preterm      AB      Living        SAB      TAB      Ectopic      Multiple      Live Births               Home Medications    Prior to Admission medications   Medication Sig Start Date End Date Taking? Authorizing Provider  buPROPion (WELLBUTRIN XL) 300 MG 24 hr tablet Take 1 tablet (300 mg total) by mouth daily. For mood 08/28/18  Yes Hisada, Elie Goody, MD  carvedilol (COREG) 25 MG tablet Take 25 mg by mouth 2 (two) times daily with a meal.   Yes [provider]  clonazePAM (KLONOPIN) 0.5 MG tablet Take 1 tablet (0.5 mg total) by mouth 2 (two) times daily as needed for anxiety. 08/28/18  Yes Norman Clay, MD  DULoxetine (CYMBALTA) 60 MG capsule Take 1 capsule (60 mg total) by mouth at bedtime. For mood 08/28/18  Yes Hisada, Elie Goody, MD  furosemide (LASIX) 20 MG tablet Take 40 mg by mouth 2 (two) times daily.   Yes [provider]  gabapentin (NEURONTIN) 300 MG capsule Take 1 capsule (300 mg total) by mouth at bedtime. For neuropathy 07/13/18  Yes Connye Burkitt, NP  lamoTRIgine (LAMICTAL) 200 MG tablet Take 1 tablet (200 mg total) by mouth daily. For mood 08/28/18  Yes Hisada, Elie Goody, MD  levothyroxine (SYNTHROID, LEVOTHROID) 150 MCG tablet Take 150 mcg by mouth daily before breakfast.   Yes [provider]  NOVOLOG 100 UNIT/ML injection Inject 2.425-2.95 Units into the skin daily. Via pump 7pm to midnight 2.950 8:30am -7pm2.925 Midnight - 8:30 2.425 02/24/17  Yes [provider]  pravastatin (PRAVACHOL) 80 MG tablet Take 80 mg by mouth at bedtime.   Yes [provider]  PROAIR HFA 108 [phone] Base) MCG/ACT inhaler Inhale 1-2 puffs into the lungs every 6 (six) hours as needed for  wheezing or shortness of breath.  12/30/16  Yes [provider]  QUEtiapine (SEROQUEL) 25 MG tablet Take 1 tablet (25 mg total) by mouth at bedtime. 08/28/18  Yes Hisada, Elie Goody, MD  traMADol (ULTRAM) 50 MG tablet Take 50 mg by mouth 2 (two) times daily.  07/14/18  Yes [provider]  acetaminophen (TYLENOL) 325 MG tablet Take 2 tablets (650 mg total) by mouth  every 6 (six) hours as needed for mild pain. (May buy over the counter) 07/13/18   Connye Burkitt, NP  aspirin EC 81 MG tablet Take 81 mg by mouth at bedtime.     [provider]  Cholecalciferol (VITAMIN D3 SUPER STRENGTH) 50 MCG (2000 UT) TABS Take by mouth. 07/26/18   [provider]  Diclofenac Sodium (PENNSAID) 2 % SOLN Place 1 application onto the skin daily as needed (for back pain).    [provider]  dicyclomine (BENTYL) 10 MG capsule Take 10 mg by mouth 3 (three) times daily as needed for spasms.    [provider]  fluticasone (FLONASE) 50 MCG/ACT nasal spray Place 2 sprays into the nose daily as needed for allergies.     [provider]  folic acid (FOLVITE) 979 MCG tablet Take 400 mcg by mouth every evening.     [provider]  lubiprostone (AMITIZA) 8 MCG capsule Take 8 mcg by mouth as needed for constipation.     [provider]  Omega-3 Fatty Acids (FISH OIL PO) Take 1 capsule by mouth daily.    [provider]  omeprazole (PRILOSEC) 20 MG capsule Take 20 mg by mouth at bedtime.     [provider]  ondansetron (ZOFRAN ODT) 4 MG disintegrating tablet Take 1 tablet (4 mg total) by mouth every 8 (eight) hours as needed. Patient taking differently: Take 4 mg by mouth every 8 (eight) hours as needed for nausea or vomiting.  01/17/17   Marcial Pacas, MD  rizatriptan (MAXALT-MLT) 10 MG disintegrating tablet Take 1 tablet (10 mg total) as needed by mouth. May repeat in 2 hours if needed Patient taking differently: Take 10 mg by mouth as needed for  migraine. May repeat in 2 hours if needed 04/20/17   Marcial Pacas, MD    Family History Family History  Problem Relation Age of Onset  . Asthma Mother   . Bipolar disorder Mother   . Heart disease Father   . Lymphoma Father   . Hypertension Father   . Thyroid disease Father   . Hyperlipidemia Father   . Diabetes Father   . Cancer Paternal Grandmother        lung and breast  . Bladder Cancer Paternal Grandfather   . Suicidality Maternal Grandfather   . Thyroid disease Brother     Social History Social History   Tobacco Use  . Smoking status: Former Smoker    Packs/day: 0.75    Years: 20.00    Pack years: 15.00    Types: Cigarettes    Last attempt to quit: 06/07/2012    Years since quitting: 6.2  . Smokeless tobacco: Never Used  Substance Use Topics  . Alcohol use: No  . Drug use: No     Allergies   Ciprofloxacin; Levaquin [levofloxacin]; Buspar [buspirone]; Linaclotide; Advair diskus [fluticasone-salmeterol]; Biaxin [clarithromycin]; and Hydroxyzine   Review of Systems Review of Systems  Constitutional: Positive for activity change and fatigue.       All ROS Neg except as noted in HPI  HENT: Negative for nosebleeds.   Eyes: Negative for photophobia and discharge.  Respiratory: Negative for cough, shortness of breath and wheezing.   Cardiovascular: Negative for chest pain and palpitations.  Gastrointestinal: Negative for abdominal pain and blood in stool.  Endocrine: Positive for polydipsia.  Genitourinary: Positive for frequency. Negative for dysuria and hematuria.  Musculoskeletal: Negative for arthralgias, back pain and neck pain.  Skin: Negative.  Neurological: Negative for dizziness, seizures and speech difficulty.  Psychiatric/Behavioral: Positive for suicidal ideas. Negative for confusion and hallucinations. The patient is nervous/anxious.      Physical Exam Updated Vital Signs BP (!) 163/89 (BP Location: Right Arm)   Pulse 89   Temp 98.1 F (36.7  C) (Oral)   Resp 18   Ht 5\' 9"  (1.753 m)   Wt 134.3 kg   SpO2 95%   BMI 43.71 kg/m   Physical Exam Vitals signs and nursing note reviewed.  Constitutional:      Appearance: She is well-developed. She is not toxic-appearing.  HENT:     Head: Normocephalic.     Right Ear: Tympanic membrane and external ear normal.     Left Ear: Tympanic membrane and external ear normal.  Eyes:     General: Lids are normal.     Pupils: Pupils are equal, round, and reactive to light.  Neck:     Musculoskeletal: Normal range of motion and neck supple.     Vascular: No carotid bruit.  Cardiovascular:     Rate and Rhythm: Normal rate and regular rhythm.     Pulses: Normal pulses.     Heart sounds: Normal heart sounds.  Pulmonary:     Effort: No respiratory distress.     Breath sounds: Normal breath sounds.  Abdominal:     General: Bowel sounds are normal.     Palpations: Abdomen is soft.     Tenderness: There is no abdominal tenderness. There is no guarding.  Musculoskeletal: Normal range of motion.  Feet:     Comments: Right foot pain to palpation and also with flexion extension of the toes related to Charcot's foot issues. Lymphadenopathy:     Head:     Right side of head: No submandibular adenopathy.     Left side of head: No submandibular adenopathy.     Cervical: No cervical adenopathy.  Skin:    General: Skin is warm and dry.  Neurological:     Mental Status: She is alert and oriented to person, place, and time.     Cranial Nerves: No cranial nerve deficit.     Sensory: No sensory deficit.  Psychiatric:        Mood and Affect: Mood is depressed. Affect is flat.        Speech: Speech normal.        Behavior: Behavior normal.        Thought Content: Thought content includes suicidal ideation. Thought content includes suicidal plan.        Cognition and Memory: Cognition normal.        Judgment: Judgment normal.      ED Treatments / Results  Labs (all labs ordered are listed,  but only abnormal results are displayed) Labs Reviewed  RAPID URINE DRUG SCREEN, HOSP PERFORMED - Abnormal; Notable for the following components:      Result Value   Benzodiazepines POSITIVE (*)    All other components within normal limits  CBG MONITORING, ED - Abnormal; Notable for the following components:   Glucose-Capillary 456 (*)    All other components within normal limits  COMPREHENSIVE METABOLIC PANEL  ETHANOL  CBC WITH DIFFERENTIAL/PLATELET  URINALYSIS, ROUTINE W REFLEX MICROSCOPIC  I-STAT BETA HCG BLOOD, ED ([facility], [facility], [facility] ONLY)    EKG None  Radiology No results found.  Procedures Procedures (including critical care time)  Medications Ordered in ED Medications  insulin aspart (novoLOG) injection 10 Units (has no administration in  time range)  sodium chloride 0.9 % bolus 1,000 mL (1,000 mLs Intravenous New Bag/Given 08/28/18 2211)     Initial Impression / Assessment and Plan / ED Course  I have reviewed the triage vital signs and the nursing notes.  Pertinent labs & imaging results that were available during my care of the patient were reviewed by me and considered in my medical decision making (see chart for details).          Final Clinical Impressions(s) / ED Diagnoses MDM  Pulse oximetry is 95% on room air.  Within normal limits by my interpretation.  The blood pressure is elevated at 163/89, otherwise within normal limits.  CBG reveals a glucose of 456.  IV fluids started.  Patient given 10 units of insulin.  Recheck.  Patient is tolerating IV fluids without problem.  Urine pregnancy test is negative.  Urine analysis shows a clear straw-colored specimen with a specific gravity of 1.011.  The glucose is greater than 500, ketones elevated at 20.  Protein is elevated at 100.  Otherwise within normal limits.  Urine drug screen shows benzos positive.  Otherwise within normal limits. The complete blood count is within normal limits.  The ethanol is less than  10. The comprehensive metabolic panel shows the sodium and chloride to be slightly low.  The glucose is elevated at 517.  BUN is 25, creatinine is 2.06.  The anion gap is normal at 15.  Patient evaluated by the TTS team.  It is their opinion that the patient would benefit from inpatient psych treatment and additional evaluation after the patient is cleared, particularly the hyperglycemia.  Glucose  350 After IV fluids 1 liter and IV insulin. Second liter ordered. Pt will be followed by Dr Tomi Bamberger.   Final diagnoses:  Hyperglycemia  Suicidal ideations  Bipolar II disorder [facility])    ED Discharge Orders    None       Lily Kocher, PA-C 08/29/18 0153    Hayden Rasmussen, MD 08/29/18 [phone]

## 2018-08-29 ENCOUNTER — Encounter (HOSPITAL_COMMUNITY): Payer: Self-pay | Admitting: Registered Nurse

## 2018-08-29 DIAGNOSIS — F332 Major depressive disorder, recurrent severe without psychotic features: Secondary | ICD-10-CM

## 2018-08-29 LAB — BASIC METABOLIC PANEL
Anion gap: 18 — ABNORMAL HIGH (ref 5–15)
BUN: 29 mg/dL — ABNORMAL HIGH (ref 6–20)
CO2: 17 mmol/L — ABNORMAL LOW (ref 22–32)
Calcium: 8.8 mg/dL — ABNORMAL LOW (ref 8.9–10.3)
Chloride: 97 mmol/L — ABNORMAL LOW (ref 98–111)
Creatinine, Ser: 1.86 mg/dL — ABNORMAL HIGH (ref 0.44–1.00)
GFR, EST AFRICAN AMERICAN: 37 mL/min — AB (ref >60)
GFR, EST NON AFRICAN AMERICAN: 32 mL/min — AB (ref >60)
Glucose, Bld: 608 mg/dL (ref 70–99)
Potassium: 5.4 mmol/L — ABNORMAL HIGH (ref 3.5–5.1)
Sodium: 132 mmol/L — ABNORMAL LOW (ref 135–145)

## 2018-08-29 LAB — CBG MONITORING, ED
GLUCOSE-CAPILLARY: 444 mg/dL — AB (ref 70–99)
GLUCOSE-CAPILLARY: 490 mg/dL — AB (ref 70–99)
Glucose-Capillary: 350 mg/dL — ABNORMAL HIGH (ref 70–99)
Glucose-Capillary: 302 mg/dL — ABNORMAL HIGH (ref 70–99)
Glucose-Capillary: 95 mg/dL (ref 70–99)
Glucose-Capillary: 400 mg/dL — ABNORMAL HIGH (ref 70–99)
Glucose-Capillary: 561 mg/dL (ref 70–99)
Glucose-Capillary: 303 mg/dL — ABNORMAL HIGH (ref 70–99)

## 2018-08-29 MED ORDER — SODIUM CHLORIDE 0.9 % IV SOLN
1000.0000 mL | INTRAVENOUS | Status: DC
  Administered 2018-08-29: 1000 mL via INTRAVENOUS

## 2018-08-29 MED ORDER — INSULIN ASPART 100 UNIT/ML ~~LOC~~ SOLN
5.0000 [IU] | Freq: Three times a day (TID) | SUBCUTANEOUS | Status: DC
  Administered 2018-08-29 – 2018-08-30 (×3): 5 [IU] via SUBCUTANEOUS
  Filled 2018-08-29 (×3): qty 1

## 2018-08-29 MED ORDER — SODIUM CHLORIDE 0.9 % IV BOLUS (SEPSIS)
1000.0000 mL | Freq: Once | INTRAVENOUS | Status: AC
  Administered 2018-08-29: 1000 mL via INTRAVENOUS

## 2018-08-29 MED ORDER — QUETIAPINE FUMARATE 25 MG PO TABS
25.0000 mg | ORAL_TABLET | Freq: Every day | ORAL | Status: DC
  Administered 2018-08-29: 25 mg via ORAL
  Filled 2018-08-29: qty 1

## 2018-08-29 MED ORDER — INSULIN GLARGINE 100 UNIT/ML ~~LOC~~ SOLN
40.0000 [IU] | Freq: Every day | SUBCUTANEOUS | Status: DC
  Administered 2018-08-29 – 2018-08-30 (×2): 40 [IU] via SUBCUTANEOUS
  Filled 2018-08-29 (×3): qty 0.4

## 2018-08-29 MED ORDER — BUPROPION HCL ER (XL) 300 MG PO TB24
300.0000 mg | ORAL_TABLET | Freq: Every day | ORAL | Status: DC
  Administered 2018-08-29 – 2018-08-30 (×2): 300 mg via ORAL
  Filled 2018-08-29 (×5): qty 1

## 2018-08-29 MED ORDER — ACETAMINOPHEN 325 MG PO TABS
650.0000 mg | ORAL_TABLET | Freq: Once | ORAL | Status: AC
  Administered 2018-08-29: 650 mg via ORAL
  Filled 2018-08-29: qty 2

## 2018-08-29 MED ORDER — CLONAZEPAM 0.5 MG PO TABS
0.5000 mg | ORAL_TABLET | Freq: Two times a day (BID) | ORAL | Status: DC
  Administered 2018-08-29 – 2018-08-30 (×2): 0.5 mg via ORAL
  Filled 2018-08-29 (×2): qty 1

## 2018-08-29 MED ORDER — INSULIN ASPART 100 UNIT/ML ~~LOC~~ SOLN
0.0000 [IU] | SUBCUTANEOUS | Status: DC
  Administered 2018-08-29: 15 [IU] via SUBCUTANEOUS
  Administered 2018-08-29: 20 [IU] via SUBCUTANEOUS
  Administered 2018-08-30: 7 [IU] via SUBCUTANEOUS
  Administered 2018-08-30: 11 [IU] via SUBCUTANEOUS
  Filled 2018-08-29 (×4): qty 1

## 2018-08-29 MED ORDER — LAMOTRIGINE 25 MG PO TABS
200.0000 mg | ORAL_TABLET | Freq: Every day | ORAL | Status: DC
  Administered 2018-08-29 – 2018-08-30 (×2): 200 mg via ORAL
  Filled 2018-08-29 (×2): qty 8

## 2018-08-29 MED ORDER — INSULIN ASPART 100 UNIT/ML ~~LOC~~ SOLN: 7.0000 [IU] | Freq: Once | SUBCUTANEOUS | Status: DC

## 2018-08-29 MED ORDER — SODIUM CHLORIDE 0.9 % IV BOLUS
1000.0000 mL | Freq: Once | INTRAVENOUS | Status: AC
  Administered 2018-08-29: 1000 mL via INTRAVENOUS

## 2018-08-29 MED ORDER — INSULIN ASPART 100 UNIT/ML IV SOLN
10.0000 [IU] | Freq: Once | INTRAVENOUS | Status: AC
  Administered 2018-08-29: 10 [IU] via INTRAVENOUS

## 2018-08-29 MED ORDER — ONDANSETRON HCL 4 MG/2ML IJ SOLN
4.0000 mg | Freq: Once | INTRAMUSCULAR | Status: AC
  Administered 2018-08-29: 4 mg via INTRAVENOUS
  Filled 2018-08-29: qty 2

## 2018-08-29 MED ORDER — DULOXETINE HCL 30 MG PO CPEP
60.0000 mg | ORAL_CAPSULE | Freq: Every day | ORAL | Status: DC
  Administered 2018-08-29: 60 mg via ORAL
  Filled 2018-08-29: qty 2

## 2018-08-29 NOTE — ED Notes (Signed)
Pharmacy on telephone for [facility] med rec

## 2018-08-29 NOTE — Consult Note (Signed)
  Tele Assessment   Jasmine [name], 48 y.o., female patient presented to [facility] with complaints of suicidal ideation and plan to overdose on medication; stating that she has no purpose and that she is a burden.  Today patient is stating that she is no longer suicidal and is wanting to go home.  Patient seen via telepsych by this provider; chart reviewed and consulted with Dr. Dwyane Dee on 08/29/18.  On evaluation Jasmine [name] reports she came to the hospital because she was having problems with her blood sugar.  Patient states that she was having a relationship on the side that was causing her depression but she ended it to day on the phone and feeling better.  States that her husband doesn't know about the affair.  Patient states that she was having plans to overdose but doesn't feel the same.  Patient states that she does have a prior history of suicide attempt 20 yrs ago.  At this time patient denies suicidal/homicidal ideation, psychosis, and paranoia  Collateral information:  Spoke to the husband and father of patient Jasmine [name] and Jasmine [name]) both state that the patient has made suicidal statements and husband informs that patient attempted to take an overdose 1-2 weeks ago.  Both state that they do not feel that the patient is safe at home and feels that patient will try to kill herself if she is discharged back home.      Recommendations: Inpatient.  Patient will need to have blood sugar under control before she is admitted for psychiatric treatment.  Possible medical admission prior to phychiatric admission recommended.   Disposition:  Recommend psychiatric Inpatient admission when medically cleared.   Spoke with Magda Paganini, RN; informed of above recommendation and disposition; will inform EDP   [name], NP

## 2018-08-29 NOTE — ED Notes (Signed)
[place] on telepsych for patient for consult now.
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
## 2018-08-29 NOTE — ED Notes (Signed)
Pts bookbag wanded and placed in locker.

## 2018-08-29 NOTE — Progress Notes (Signed)
Inpatient Diabetes Program Recommendations  AACE/ADA: New Consensus Statement on Inpatient Glycemic Control (2015)  Target Ranges:  Prepandial:   less than 140 mg/dL      Peak postprandial:   less than 180 mg/dL (1-2 hours)      Critically ill patients:  140 - 180 mg/dL   Lab Results  Component Value Date   GLUCAP 400 (H) 08/29/2018   HGBA1C 9.6 (H) 12/25/2012    Review of Glycemic Control  Diabetes history: DM1 Outpatient Diabetes medications: Insulin pump Current orders for Inpatient glycemic control: Novolog 0-20 units Q4H Received Novolog 10 units IV at 0400.   Spoke with RN regarding pt being Type 1, does not make any endogenous insulin and will need both basal and bolus insulin.No BMET since 3/23 at 2235.   Inpatient Diabetes Program Recommendations:     Please consider Lantus 40 units Q24H - to start now. (kg x 0.3) Decrease Novolog to 0-9 units tidwc and hs Add Novolog 5 units tidwc for meal coverage insulin if pt eats > 50% meal.  BMET now to r/o DKA  Would not restart insulin pump until pt has seen endo.  Pump settings: Per Diabetes Coordinator note 07/11/2018  Placed a call to pt's ENDO office yest (02/03). Per DM Educator (pump trainer) in the office, patient was instructed to decrease her basal rates on her pump by 20% on 07/03/2018. Per the DM Educator, these rates would be approximately the following: 12am- 2 units/hr 10am- 2.2 units/hr 7pm- 2.3 units/hr (51.3 total units per 24 hour period)  Last Carb Ratio as of Sept 2019 was 1 unit for every 16 grams CHO  Last Correction Factor as of Sept 2019 was 1 unit for every 50 mg/dl above target CBG  Will follow.  Thank you. Lorenda Peck, RD, LDN, CDE Inpatient Diabetes Coordinator [phone]

## 2018-08-29 NOTE — ED Notes (Signed)
Pt tried to leave dept.  Pt tearful.  Pt said she is not suicidal.  Pt says she wants to go home.  She said she can have someone stay with her 24hours.  Pt says her sugar is up because she doesn't have her insulin pump.  Per Ria Comment at [facility], pt has a bed at [facility] but can't come until blood sugars are below 350.  Pt says she has an appt today with her psychiatrist at 4pm.  Pt denies SI at this time.  Notified Ria Comment and she will ask a Biomedical scientist to [employer] patient.   Pt notified and agrees with plan.

## 2018-08-29 NOTE — ED Notes (Signed)
Patient verbally upset, stating " I want to leave", patient began going to down hallway pushing IV pole.  Patient was able to be redirected back to room.  Patient tearful.

## 2018-08-29 NOTE — Progress Notes (Signed)
  Pharmacy Consult for insulin   Allergies  Allergen Reactions  . Ciprofloxacin Swelling and Other (See Comments)    Per pt caused lips swell and nauseous feeling  . Levaquin [Levofloxacin] Swelling and Other (See Comments)    Per pt caused lips swell and nauseous feeling  . Buspar [Buspirone] Other (See Comments)    abd cramping  . Linaclotide Other (See Comments)  . Advair Diskus [Fluticasone-Salmeterol] Other (See Comments)    Thrush   . Biaxin [Clarithromycin] Rash  . Hydroxyzine Palpitations    Patient Measurements: Height: 5\' 9"  (175.3 cm) Weight: 296 lb (134.3 kg) IBW/kg (Calculated) : 66.2 Adjusted Body Weight:   Vital Signs: Temp: 98.1 F (36.7 C) (03/23 2114) Temp Source: Oral (03/23 2114) BP: 163/89 (03/23 2114) Pulse Rate: 89 (03/23 2114)  Labs: Recent Labs    08/28/18 2235  WBC 8.6  HGB 13.2  PLT 239  CREATININE 2.06*    Estimated Creatinine Clearance: 50.3 mL/min (A) (by C-G formula based on SCr of 2.06 mg/dL (H)).  No results for input(s): VANCOTROUGH, VANCOPEAK, VANCORANDOM, GENTTROUGH, GENTPEAK, GENTRANDOM, TOBRATROUGH, TOBRAPEAK, TOBRARND, AMIKACINPEAK, AMIKACINTROU, AMIKACIN in the last 72 hours.   Microbiology: No results found for this or any previous visit (from the past 720 hour(s)).  Medical History: Past Medical History:  Diagnosis Date  . Anxiety   . Bipolar disorder ([place])   . Chronic fatigue   . CKD (chronic kidney disease), stage II   . Depression   . Diabetes mellitus   . DKA, type 1 ([place]) 11/04/2011  . Elevated cholesterol   . Fibromyalgia   . GERD (gastroesophageal reflux disease)   . Headache   . Hyperlipemia   . Hypertension   . Hypothyroidism   . IBS (irritable bowel syndrome)   . Obesity   . Sleep apnea    HAS C -PAP / DOES NOT USE  . Stress incontinence    Pt had surgery to correct this.  . Tachycardia   . Tobacco abuse   . UTI (lower urinary tract infection)     Medications:  Scheduled:  . insulin aspart   0-20 Units Subcutaneous Q4H   Infusions:  . sodium chloride 1,000 mL (08/29/18 0158)    Assessment/Plan: Asked to change patient from insulin pump to sliding scale.  Patient in resistant category based on weight. Will order resistant sliding scale insulin based on order set.   Nyra Capes, [facility] 08/29/2018,7:07 AM

## 2018-08-29 NOTE — ED Notes (Signed)
TTS states pt is recommended for inpatient after medically cleared. In order to be medically cleared per their policy, her CBG has to be <350 for 24 hours

## 2018-08-29 NOTE — ED Provider Notes (Signed)
Addendum to patient's care.  Patient's been reassessed by behavioral health.  They feel that she does warrant inpatient care.  Patient attempted to leave earlier will be IVC.  Patient has type 1 diabetes had an insulin pump in the past.  But it malfunction.  Patient's been on a sliding scale.  Blood sugars have been somewhat erratic.  Reviewed by the diabetic nurse and had some recommendations for Lantus 40 units q. 24 to start now and then add 5 units of NovoLog 3 times a day with meals.  Patient also had all her psychiatric meds added in.  Patient refusing medical admission.   Fredia Sorrow, MD 08/29/18 1700

## 2018-08-29 NOTE — ED Notes (Signed)
Date and time results received: 08/29/18 1805 (use smartphrase ".now" to insert current time)  Test: glucose Critical Value: 608  Name of Provider Notified: Dr. Eulis Foster  Orders Received? Or Actions Taken?: none at this time.

## 2018-08-30 ENCOUNTER — Other Ambulatory Visit: Payer: Self-pay

## 2018-08-30 ENCOUNTER — Inpatient Hospital Stay (HOSPITAL_COMMUNITY)
Admission: AD | Admit: 2018-08-30 | Discharge: 2018-09-03 | DRG: 885 | Disposition: A | Discharge status: Home / Self Care | Payer: BLUE CROSS/BLUE SHIELD | Source: Intra-hospital | Attending: Psychiatry | Admitting: Psychiatry

## 2018-08-30 ENCOUNTER — Encounter (HOSPITAL_COMMUNITY): Payer: Self-pay | Admitting: *Deleted

## 2018-08-30 DIAGNOSIS — Z8249 Family history of ischemic heart disease and other diseases of the circulatory system: Secondary | ICD-10-CM | POA: Diagnosis not present

## 2018-08-30 DIAGNOSIS — G4730 Sleep apnea, unspecified: Secondary | ICD-10-CM | POA: Diagnosis present

## 2018-08-30 DIAGNOSIS — F3181 Bipolar II disorder: Secondary | ICD-10-CM | POA: Diagnosis not present

## 2018-08-30 DIAGNOSIS — I129 Hypertensive chronic kidney disease with stage 1 through stage 4 chronic kidney disease, or unspecified chronic kidney disease: Secondary | ICD-10-CM | POA: Diagnosis present

## 2018-08-30 DIAGNOSIS — K219 Gastro-esophageal reflux disease without esophagitis: Secondary | ICD-10-CM | POA: Diagnosis present

## 2018-08-30 DIAGNOSIS — F411 Generalized anxiety disorder: Secondary | ICD-10-CM | POA: Diagnosis present

## 2018-08-30 DIAGNOSIS — E785 Hyperlipidemia, unspecified: Secondary | ICD-10-CM | POA: Diagnosis present

## 2018-08-30 DIAGNOSIS — Z888 Allergy status to other drugs, medicaments and biological substances status: Secondary | ICD-10-CM | POA: Diagnosis not present

## 2018-08-30 DIAGNOSIS — Z818 Family history of other mental and behavioral disorders: Secondary | ICD-10-CM | POA: Diagnosis not present

## 2018-08-30 DIAGNOSIS — Z7982 Long term (current) use of aspirin: Secondary | ICD-10-CM | POA: Diagnosis not present

## 2018-08-30 DIAGNOSIS — G4700 Insomnia, unspecified: Secondary | ICD-10-CM | POA: Diagnosis present

## 2018-08-30 DIAGNOSIS — F332 Major depressive disorder, recurrent severe without psychotic features: Secondary | ICD-10-CM | POA: Diagnosis present

## 2018-08-30 DIAGNOSIS — R45851 Suicidal ideations: Secondary | ICD-10-CM | POA: Diagnosis present

## 2018-08-30 DIAGNOSIS — Z833 Family history of diabetes mellitus: Secondary | ICD-10-CM | POA: Diagnosis not present

## 2018-08-30 DIAGNOSIS — Z881 Allergy status to other antibiotic agents status: Secondary | ICD-10-CM | POA: Diagnosis not present

## 2018-08-30 DIAGNOSIS — K589 Irritable bowel syndrome without diarrhea: Secondary | ICD-10-CM | POA: Diagnosis present

## 2018-08-30 DIAGNOSIS — N182 Chronic kidney disease, stage 2 (mild): Secondary | ICD-10-CM | POA: Diagnosis present

## 2018-08-30 DIAGNOSIS — M797 Fibromyalgia: Secondary | ICD-10-CM | POA: Diagnosis present

## 2018-08-30 DIAGNOSIS — Z87891 Personal history of nicotine dependence: Secondary | ICD-10-CM | POA: Diagnosis not present

## 2018-08-30 DIAGNOSIS — Z883 Allergy status to other anti-infective agents status: Secondary | ICD-10-CM | POA: Diagnosis not present

## 2018-08-30 DIAGNOSIS — E1022 Type 1 diabetes mellitus with diabetic chronic kidney disease: Secondary | ICD-10-CM | POA: Diagnosis present

## 2018-08-30 DIAGNOSIS — Z79890 Hormone replacement therapy (postmenopausal): Secondary | ICD-10-CM | POA: Diagnosis not present

## 2018-08-30 DIAGNOSIS — E039 Hypothyroidism, unspecified: Secondary | ICD-10-CM | POA: Diagnosis present

## 2018-08-30 DIAGNOSIS — F430 Acute stress reaction: Secondary | ICD-10-CM | POA: Diagnosis present

## 2018-08-30 DIAGNOSIS — Z90721 Acquired absence of ovaries, unilateral: Secondary | ICD-10-CM | POA: Diagnosis not present

## 2018-08-30 LAB — BASIC METABOLIC PANEL
Anion gap: 10 (ref 5–15)
BUN: 28 mg/dL — ABNORMAL HIGH (ref 6–20)
CALCIUM: 8.9 mg/dL (ref 8.9–10.3)
CO2: 23 mmol/L (ref 22–32)
CREATININE: 1.64 mg/dL — AB (ref 0.44–1.00)
Chloride: 103 mmol/L (ref 98–111)
GFR calc Af Amer: 43 mL/min — ABNORMAL LOW (ref >60)
GFR calc non Af Amer: 37 mL/min — ABNORMAL LOW (ref >60)
Glucose, Bld: 335 mg/dL — ABNORMAL HIGH (ref 70–99)
Potassium: 5.1 mmol/L (ref 3.5–5.1)
Sodium: 136 mmol/L (ref 135–145)

## 2018-08-30 LAB — CBG MONITORING, ED
Glucose-Capillary: 297 mg/dL — ABNORMAL HIGH (ref 70–99)
Glucose-Capillary: 221 mg/dL — ABNORMAL HIGH (ref 70–99)
Glucose-Capillary: 122 mg/dL — ABNORMAL HIGH (ref 70–99)
Glucose-Capillary: 278 mg/dL — ABNORMAL HIGH (ref 70–99)

## 2018-08-30 LAB — LACTIC ACID, PLASMA: Lactic Acid, Venous: 0.8 mmol/L (ref 0.5–1.9)

## 2018-08-30 LAB — GLUCOSE, CAPILLARY: Glucose-Capillary: 316 mg/dL — ABNORMAL HIGH (ref 70–99)

## 2018-08-30 MED ORDER — BUPROPION HCL ER (XL) 300 MG PO TB24
300.0000 mg | ORAL_TABLET | Freq: Every day | ORAL | Status: DC
  Administered 2018-08-31 – 2018-09-03 (×4): 300 mg via ORAL
  Filled 2018-08-30 (×7): qty 1

## 2018-08-30 MED ORDER — FOLIC ACID 1 MG PO TABS
1000.0000 ug | ORAL_TABLET | Freq: Every evening | ORAL | Status: DC
  Administered 2018-08-31 – 2018-09-02 (×3): 1 mg via ORAL
  Filled 2018-08-30 (×6): qty 1

## 2018-08-30 MED ORDER — INSULIN GLARGINE 100 UNIT/ML ~~LOC~~ SOLN
40.0000 [IU] | Freq: Every day | SUBCUTANEOUS | Status: DC
  Administered 2018-08-31: 40 [IU] via SUBCUTANEOUS

## 2018-08-30 MED ORDER — PANTOPRAZOLE SODIUM 40 MG PO TBEC
40.0000 mg | DELAYED_RELEASE_TABLET | Freq: Every day | ORAL | Status: DC
  Administered 2018-08-31 – 2018-09-03 (×4): 40 mg via ORAL
  Filled 2018-08-30 (×7): qty 1

## 2018-08-30 MED ORDER — QUETIAPINE FUMARATE 25 MG PO TABS
25.0000 mg | ORAL_TABLET | Freq: Every day | ORAL | Status: DC
  Administered 2018-08-30 – 2018-08-31 (×2): 25 mg via ORAL
  Filled 2018-08-30 (×4): qty 1

## 2018-08-30 MED ORDER — GABAPENTIN 300 MG PO CAPS
300.0000 mg | ORAL_CAPSULE | Freq: Every day | ORAL | Status: DC
  Administered 2018-08-30 – 2018-09-02 (×4): 300 mg via ORAL
  Filled 2018-08-30 (×6): qty 1

## 2018-08-30 MED ORDER — ACETAMINOPHEN 325 MG PO TABS
650.0000 mg | ORAL_TABLET | Freq: Once | ORAL | Status: AC
  Administered 2018-08-30: 650 mg via ORAL

## 2018-08-30 MED ORDER — PRAVASTATIN SODIUM 40 MG PO TABS
80.0000 mg | ORAL_TABLET | Freq: Every day | ORAL | Status: DC
  Administered 2018-08-31 – 2018-09-02 (×3): 80 mg via ORAL
  Filled 2018-08-30: qty 1
  Filled 2018-08-30: qty 2
  Filled 2018-08-30 (×2): qty 1
  Filled 2018-08-30: qty 2
  Filled 2018-08-30: qty 1
  Filled 2018-08-30: qty 2

## 2018-08-30 MED ORDER — ACETAMINOPHEN 325 MG PO TABS
ORAL_TABLET | ORAL | Status: AC
  Filled 2018-08-30: qty 2

## 2018-08-30 MED ORDER — LAMOTRIGINE 100 MG PO TABS
200.0000 mg | ORAL_TABLET | Freq: Every day | ORAL | Status: DC
  Administered 2018-08-31 – 2018-09-03 (×4): 200 mg via ORAL
  Filled 2018-08-30 (×3): qty 2
  Filled 2018-08-30 (×3): qty 1
  Filled 2018-08-30 (×3): qty 2

## 2018-08-30 MED ORDER — ACETAMINOPHEN 325 MG PO TABS: 650.0000 mg | ORAL_TABLET | Freq: Four times a day (QID) | ORAL | Status: DC | PRN

## 2018-08-30 MED ORDER — INSULIN ASPART 100 UNIT/ML ~~LOC~~ SOLN
0.0000 [IU] | Freq: Three times a day (TID) | SUBCUTANEOUS | Status: DC
  Administered 2018-08-30: 5 [IU] via SUBCUTANEOUS
  Filled 2018-08-30: qty 1

## 2018-08-30 MED ORDER — MAGNESIUM HYDROXIDE 400 MG/5ML PO SUSP: 30.0000 mL | Freq: Every day | ORAL | Status: DC | PRN

## 2018-08-30 MED ORDER — CARVEDILOL 25 MG PO TABS
25.0000 mg | ORAL_TABLET | Freq: Two times a day (BID) | ORAL | Status: DC
  Administered 2018-08-31 – 2018-09-03 (×7): 25 mg via ORAL
  Filled 2018-08-30 (×9): qty 1

## 2018-08-30 MED ORDER — ALUM & MAG HYDROXIDE-SIMETH 200-200-20 MG/5ML PO SUSP: 30.0000 mL | ORAL | Status: DC | PRN

## 2018-08-30 MED ORDER — DULOXETINE HCL 60 MG PO CPEP
60.0000 mg | ORAL_CAPSULE | Freq: Every day | ORAL | Status: DC
  Administered 2018-08-30 – 2018-09-02 (×4): 60 mg via ORAL
  Filled 2018-08-30 (×6): qty 1

## 2018-08-30 MED ORDER — INSULIN ASPART 100 UNIT/ML ~~LOC~~ SOLN
0.0000 [IU] | Freq: Every day | SUBCUTANEOUS | Status: DC
  Administered 2018-08-30: 4 [IU] via SUBCUTANEOUS
  Administered 2018-09-01: 2 [IU] via SUBCUTANEOUS
  Administered 2018-09-02: 4 [IU] via SUBCUTANEOUS

## 2018-08-30 MED ORDER — ASPIRIN EC 81 MG PO TBEC
81.0000 mg | DELAYED_RELEASE_TABLET | Freq: Every day | ORAL | Status: DC
  Administered 2018-08-30 – 2018-09-02 (×4): 81 mg via ORAL
  Filled 2018-08-30 (×6): qty 1

## 2018-08-30 MED ORDER — INSULIN ASPART 100 UNIT/ML ~~LOC~~ SOLN: 0.0000 [IU] | Freq: Every day | SUBCUTANEOUS | Status: DC

## 2018-08-30 MED ORDER — INSULIN ASPART 100 UNIT/ML ~~LOC~~ SOLN
5.0000 [IU] | Freq: Three times a day (TID) | SUBCUTANEOUS | Status: DC
  Administered 2018-08-31 – 2018-09-03 (×10): 5 [IU] via SUBCUTANEOUS

## 2018-08-30 MED ORDER — LEVOTHYROXINE SODIUM 75 MCG PO TABS
150.0000 ug | ORAL_TABLET | Freq: Every day | ORAL | Status: DC
  Administered 2018-08-31 – 2018-09-03 (×4): 150 ug via ORAL
  Filled 2018-08-30 (×3): qty 1
  Filled 2018-08-30 (×4): qty 2

## 2018-08-30 MED ORDER — INSULIN ASPART 100 UNIT/ML ~~LOC~~ SOLN
0.0000 [IU] | Freq: Three times a day (TID) | SUBCUTANEOUS | Status: DC
  Administered 2018-08-31: 5 [IU] via SUBCUTANEOUS
  Administered 2018-08-31: 3 [IU] via SUBCUTANEOUS
  Administered 2018-08-31: 2 [IU] via SUBCUTANEOUS
  Administered 2018-09-01: 1 [IU] via SUBCUTANEOUS
  Administered 2018-09-01: 3 [IU] via SUBCUTANEOUS
  Administered 2018-09-01: 0 [IU] via SUBCUTANEOUS
  Administered 2018-09-02: 2 [IU] via SUBCUTANEOUS
  Administered 2018-09-02: 1 [IU] via SUBCUTANEOUS
  Administered 2018-09-03: 2 [IU] via SUBCUTANEOUS

## 2018-08-30 NOTE — Progress Notes (Signed)
Jasmine [name] is a 48 year old female pt admitted on involuntary basis from [facility]. On admission, she currently denies SI but reports that she has been feeling suicidal recently and is able to contract for safety while in the hospital. She reports that her husband brought her into the hospital because of the things that she has been saying. She reports that she takes her medications as prescribed. She denies any substance abuse issues. She reports that she lives with her husband and father and reports that she will return there once discharged. Jasmine [name] was cooperative during admission, was oriented to the milieu and safety maintained.

## 2018-08-30 NOTE — BH Assessment (Signed)
TTS met with pt for reassessment.  Pt reporting that she has not had SI since yesterday.  States she is feeling better and wants to be discharged home at this point.  Denies HI/AV as well.  Pt reports she lives with her husband and father but "I have someone else in my life."  Upon further questioning, pt reports she was seeing another man, but broke this relationship off yesterday and thinks things will be OK at home moving forward.    TTS discussed this pt with [name], NP, at [facility].  [place] spoke with pt father and husband yesterday and the plan continues to be for pt to be admitted to [facility] once medically cleared.   TTS spoke to [place], Therapist, sports, at [employer].  MD will be seeing pt at some point and will document medical clearance. Winferd Humphrey, MSW, LCSW Clinical Social Worker 08/30/2018 9:20 AM

## 2018-08-30 NOTE — ED Provider Notes (Signed)
I happened to look at patient's labs and noted that her blood work that was done at around 5 PM was consistent with DKA.  Her lab work was repeated and the DKA has resolved.  Nurses told me she had missed a couple doses of her insulin throughout the day.  She is now back on her regular insulin regimen.    BMP Latest Ref Rng & Units 08/30/2018 08/29/2018 08/28/2018  Glucose 70 - 99 mg/dL 335(H) 608(HH) 517(HH)  BUN 6 - 20 mg/dL 28(H) 29(H) 25(H)  Creatinine 0.44 - 1.00 mg/dL 1.64(H) 1.86(H) 2.06(H)  Sodium 135 - 145 mmol/L 136 132(L) 133(L)  Potassium 3.5 - 5.1 mmol/L 5.1 5.4(H) 4.8  Chloride 98 - 111 mmol/L 103 97(L) 96(L)  CO2 22 - 32 mmol/L 23 17(L) 22  Calcium 8.9 - 10.3 mg/dL 8.9 8.8(L) 9.2        AG 10          AG18                                Rolland Porter, MD 08/30/18 [phone]

## 2018-08-30 NOTE — Progress Notes (Signed)
Inpatient Diabetes Program Recommendations  AACE/ADA: New Consensus Statement on Inpatient Glycemic Control (2015)  Target Ranges:  Prepandial:   less than 140 mg/dL      Peak postprandial:   less than 180 mg/dL (1-2 hours)      Critically ill patients:  140 - 180 mg/dL   Lab Results  Component Value Date   GLUCAP 122 (H) 08/30/2018   HGBA1C 9.6 (H) 12/25/2012    Review of Glycemic Control  DKA resolved. Pt is Type 1 and sensitive to insulin. Will need lower insulin sensitive s/s.  Inpatient Diabetes Program Recommendations:     Decrease Novolog to 0-9 units tidwc and hs. D/C Q4H since pt is eating.  Will speak with pt this am regarding her glucose control at home and insulin pump usage. Would NOT recommend restarting pump until pt has seen Endo.  Continue to follow.   Thank you. Lorenda Peck, RD, LDN, CDE Inpatient Diabetes Coordinator [phone]

## 2018-08-30 NOTE — ED Provider Notes (Signed)
Pt's new insulin regimen continues with improvement in CBG's. Last labs early this morning already improved. Pt medically cleared for further Psych evaluation/placement.    Francine Graven, DO 08/30/18 1221

## 2018-08-30 NOTE — Plan of Care (Addendum)
D: Patient in bed awake on approach. Patient is tearful. Patient is alert and cooperative. Denies SI, HI, AVH, and verbally contracts for safety. Patient denies physical symptoms/pain. Patient states his/her goal is   A: Medications administered per MD order. Support provided. Patient educated on safety on the unit and medications. Routine safety checks every 15 minutes. Patient stated understanding to tell nurse about any new physical symptoms. Patient understands to tell staff of any needs.     R: No adverse drug reactions noted. Patient verbally contracts for safety. Patient remains safe at this time and will continue to monitor.   Problem: Education: Goal: Knowledge of [place] [facility] Education information/materials will improve Outcome: Progressing   Problem: Safety: Goal: Periods of time without injury will increase Outcome: Progressing   Patient oriented to the unit. Patient remains safe and will continue to monitor.

## 2018-08-30 NOTE — Progress Notes (Addendum)
Pt accepted to [facility] on [address], NP is the accepting provider.  Myles Lipps, MD is the attending provider.  Call report to [phone]  [facility] ED notified.   Pt is IVC.  Pt may be transported by [employer] Pt scheduled to arrive at [facility] at 12:30 IF CBG IS UNDER 300.  Areatha Keas. Judi Cong, MSW, [place] Disposition Clinical Social Work [phone] (cell) [phone] (office)

## 2018-08-30 NOTE — Tx Team (Signed)
Initial Treatment Plan 08/30/2018 6:29 PM Jasmine [name] MBO:[id number]    PATIENT STRESSORS: Financial difficulties Marital or family conflict Occupational concerns   PATIENT STRENGTHS: Ability for insight Average or above average intelligence Capable of independent living General fund of knowledge Motivation for treatment/growth   PATIENT IDENTIFIED PROBLEMS: Depression Suicidal thoughts "I need to learn how to handle stress"                     DISCHARGE CRITERIA:  Ability to meet basic life and health needs Improved stabilization in mood, thinking, and/or behavior Reduction of life-threatening or endangering symptoms to within safe limits Verbal commitment to aftercare and medication compliance  PRELIMINARY DISCHARGE PLAN: Attend aftercare/continuing care group Return to previous living arrangement  PATIENT/FAMILY INVOLVEMENT: This treatment plan has been presented to and reviewed with the patient, Jasmine [name], and/or family member, .  The patient and family have been given the opportunity to ask questions and make suggestions.  [place], [place], [place] 08/30/2018, 6:29 PM

## 2018-08-31 DIAGNOSIS — F332 Major depressive disorder, recurrent severe without psychotic features: Principal | ICD-10-CM

## 2018-08-31 LAB — GLUCOSE, CAPILLARY
Glucose-Capillary: 244 mg/dL — ABNORMAL HIGH (ref 70–99)
Glucose-Capillary: 252 mg/dL — ABNORMAL HIGH (ref 70–99)
Glucose-Capillary: 160 mg/dL — ABNORMAL HIGH (ref 70–99)
Glucose-Capillary: 101 mg/dL — ABNORMAL HIGH (ref 70–99)

## 2018-08-31 LAB — LIPID PANEL
Cholesterol: 163 mg/dL (ref 0–200)
HDL: 62 mg/dL (ref >40)
LDL Cholesterol: 79 mg/dL (ref 0–99)
Total CHOL/HDL Ratio: 2.6 RATIO
Triglycerides: 108 mg/dL (ref <150)
VLDL: 22 mg/dL (ref 0–40)

## 2018-08-31 LAB — HEMOGLOBIN A1C
Hgb A1c MFr Bld: 8 % — ABNORMAL HIGH (ref 4.8–5.6)
Mean Plasma Glucose: 182.9 mg/dL

## 2018-08-31 LAB — TSH: TSH: 0.879 u[IU]/mL (ref 0.350–4.500)

## 2018-08-31 MED ORDER — INSULIN GLARGINE 100 UNIT/ML ~~LOC~~ SOLN
50.0000 [IU] | Freq: Every day | SUBCUTANEOUS | Status: DC
  Administered 2018-09-01 – 2018-09-03 (×3): 50 [IU] via SUBCUTANEOUS

## 2018-08-31 NOTE — H&P (Signed)
Psychiatric Admission Assessment Adult  Patient Identification: Jasmine [name] MRN:  [id number] Date of Evaluation:  08/31/2018 Chief Complaint:  MDD Principal Diagnosis: <principal problem not specified> Diagnosis:  Active Problems:   Severe recurrent major depression without psychotic features ([place])  History of Present Illness: Patient is seen and examined.  Patient is a 48 year old female with a past psychiatric history significant for major depression, generalized anxiety disorder and reportedly bipolar disorder type II who presented to the [facility] emergency department on 08/28/2018.  The patient had seen her outpatient psychiatrist ( Dr Modesta Messing) on 3/23.  She told her psychiatrist at that time she had suicidal ideation with plan to overdose.  The patient told her psychiatrist at that time she did not act on it.  On her visit that date she denied any plan or intent of this.  She had mentioned to her husband as well as her father that she was having some suicidal ideation.  She stated that she felt at that time as though "everything that I do is wrong".  She stated that things have gotten worse when her father moved into their home approximately 2 weeks ago.  She stated that since the father is moved into the home the "he is trying to be my daddy again".  She stated her husband is a high functioning autistic patient, and does not get involved in conflict.  She feels that he is loving and supportive, but unable to help her father to understand things.  She also stated that since he had moved and that the house was "correct".  At the end of that visit she was started on Seroquel for mood stability.  She ended up going to the emergency room at [facility] shortly thereafter, and then was admitted to the psychiatric hospital.  She stated in the emergency room for 2 days and felt as though her suicidal thoughts had resolved, but there are notes in the chart that stated that nurse practitioners had spoken  to her family members who felt as though she was still suicidal.  She currently denies suicidality.  She stated that she is frustrated with things at home.  She stated that her medications from her last hospitalization in February of this year were doing well.  She did admit to be under significant psychosocial stressors including financial and social issues as mentioned above.  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  She was admitted to the hospital for evaluation and stabilization.  Associated Signs/Symptoms: Depression Symptoms:  depressed mood, anhedonia, insomnia, psychomotor retardation, fatigue, feelings of worthlessness/guilt, difficulty concentrating, suicidal thoughts without plan, anxiety, loss of energy/fatigue, disturbed sleep, (Hypo) Manic Symptoms:  Impulsivity, Irritable Mood, Anxiety Symptoms:  Excessive Worry, Psychotic Symptoms:  denied PTSD Symptoms: Negative Total Time spent with patient: 30 minutes  Past Psychiatric History: Patient is a longstanding history of bipolar disorder type II.  She has been on multiple medications in the past.  She has a most recent psychiatric hospitalization here at [facility] in February 2020.  Is the patient at risk to self? Yes.    Has the patient been a risk to self in the past 6 months? Yes.    Has the patient been a risk to self within the distant past? No.  Is the patient a risk to others? No.  Has the patient been a risk to others in the past 6 months?  No.  Has the patient been a risk to others within the distant past? No.   Prior Inpatient Therapy:   Prior Outpatient Therapy:    Alcohol Screening: 1. How often do you have a drink containing alcohol?: Never 2. How many drinks containing alcohol do you have on a typical day when you are drinking?: 1 or 2 3. How often do you have six or more drinks on one occasion?:  Never AUDIT-C Score: 0 4. How often during the last year have you found that you were not able to stop drinking once you had started?: Never 5. How often during the last year have you failed to do what was normally expected from you becasue of drinking?: Never 6. How often during the last year have you needed a first drink in the morning to get yourself going after a heavy drinking session?: Never 7. How often during the last year have you had a feeling of guilt of remorse after drinking?: Never 8. How often during the last year have you been unable to remember what happened the night before because you had been drinking?: Never 9. Have you or someone else been injured as a result of your drinking?: No 10. Has a relative or friend or a doctor or another health worker been concerned about your drinking or suggested you cut down?: No Alcohol Use Disorder Identification Test Final Score (AUDIT): 0 Alcohol Brief Interventions/Follow-up: AUDIT Score <7 follow-up not indicated Substance Abuse History in the last 12 months:  No. Consequences of Substance Abuse: Negative Previous Psychotropic Medications: Yes  Psychological Evaluations: Yes  Past Medical History:  Past Medical History:  Diagnosis Date  . Anxiety   . Bipolar disorder ([place])   . Chronic fatigue   . CKD (chronic kidney disease), stage II   . Depression   . Diabetes mellitus   . DKA, type 1 ([place]) 11/04/2011  . Elevated cholesterol   . Fibromyalgia   . GERD (gastroesophageal reflux disease)   . Headache   . Hyperlipemia   . Hypertension   . Hypothyroidism   . IBS (irritable bowel syndrome)   . Obesity   . Sleep apnea    HAS C -PAP / DOES NOT USE  . Stress incontinence    Pt had surgery to correct this.  . Tachycardia   . Tobacco abuse   . UTI (lower urinary tract infection)     Past Surgical History:  Procedure Laterality Date  . INCONTINENCE SURGERY    . NASAL FRACTURE SURGERY    . ovary removed    . OVARY SURGERY     . PUBOVAGINAL SLING  08/16/2011   Procedure: [name];  Surgeon: Bernestine Amass, MD;  Location: [facility];  Service: Urology;  Laterality: N/A;         . UTERINE FIBROID SURGERY  2001   Family History:  Family History  Problem Relation Age of Onset  . Asthma Mother   . Bipolar disorder Mother   . Heart disease Father   . Lymphoma Father   . Hypertension Father   . Thyroid disease Father   . Hyperlipidemia Father   . Diabetes Father   . Cancer Paternal Grandmother        lung and breast  . Bladder Cancer Paternal Grandfather   . Suicidality Maternal Grandfather   . Thyroid disease Brother    Family Psychiatric  History: Noncontributory Tobacco Screening: Have you used any form of tobacco in the last 30 days? (Cigarettes,  Smokeless Tobacco, Cigars, and/or Pipes): No Social History:  Social History   Substance and Sexual Activity  Alcohol Use No     Social History   Substance and Sexual Activity  Drug Use No    Additional Social History:                           Allergies:   Allergies  Allergen Reactions  . Ciprofloxacin Swelling and Other (See Comments)    Per pt caused lips swell and nauseous feeling  . Levaquin [Levofloxacin] Swelling and Other (See Comments)    Per pt caused lips swell and nauseous feeling  . Buspar [Buspirone] Other (See Comments)    abd cramping  . Linaclotide Other (See Comments)  . Advair Diskus [Fluticasone-Salmeterol] Other (See Comments)    Thrush   . Biaxin [Clarithromycin] Rash  . Hydroxyzine Palpitations   Lab Results:  Results for orders placed or performed during the hospital encounter of 08/30/18 (from the past 48 hour(s))  Glucose, capillary     Status: Abnormal   Collection Time: 08/30/18  8:27 PM  Result Value Ref Range   Glucose-Capillary 316 (H) 70 - 99 mg/dL  Glucose, capillary     Status: Abnormal   Collection Time: 08/31/18  6:10 AM  Result Value Ref Range   Glucose-Capillary 244 (H) 70 - 99  mg/dL   Comment 1 QC Due   Hemoglobin A1c     Status: Abnormal   Collection Time: 08/31/18  6:32 AM  Result Value Ref Range   Hgb A1c MFr Bld 8.0 (H) 4.8 - 5.6 %    Comment: (NOTE) Pre diabetes:          5.7%-6.4% Diabetes:              >6.4% Glycemic control for   <7.0% adults with diabetes    Mean Plasma Glucose 182.9 mg/dL    Comment: Performed at [facility], [address], [place], [place] [address]  Lipid panel     Status: None   Collection Time: 08/31/18  6:32 AM  Result Value Ref Range   Cholesterol 163 0 - 200 mg/dL   Triglycerides 108 <150 mg/dL   HDL 62 >40 mg/dL   Total CHOL/HDL Ratio 2.6 RATIO   VLDL 22 0 - 40 mg/dL   LDL Cholesterol 79 0 - 99 mg/dL    Comment:        Total Cholesterol/HDL:CHD Risk Coronary Heart Disease Risk Table                     Men   Women  1/2 Average Risk   3.4   3.3  Average Risk       5.0   4.4  2 X Average Risk   9.6   7.1  3 X Average Risk  23.4   11.0        Use the calculated Patient Ratio above and the CHD Risk Table to determine the patient's CHD Risk.        ATP III CLASSIFICATION (LDL):  <100     mg/dL   Optimal  100-129  mg/dL   Near or Above                    Optimal  130-159  mg/dL   Borderline  160-189  mg/dL   High  >190     mg/dL   Very High Performed  at [facility], [place] [address], [place], [place] [address]   TSH     Status: None   Collection Time: 08/31/18  6:32 AM  Result Value Ref Range   TSH 0.879 0.350 - 4.500 uIU/mL    Comment: Performed by a 3rd Generation assay with a functional sensitivity of <=0.01 uIU/mL. Performed at [facility], [place] [address], [place], [place] [address]   Glucose, capillary     Status: Abnormal   Collection Time: 08/31/18 12:02 PM  Result Value Ref Range   Glucose-Capillary 252 (H) 70 - 99 mg/dL   Comment 1 Notify RN    Comment 2 Document in Chart     Blood Alcohol level:  Lab Results  Component Value Date   ETH <10  08/28/2018   ETH <10 03/23/5101    Metabolic Disorder Labs:  Lab Results  Component Value Date   HGBA1C 8.0 (H) 08/31/2018   MPG 182.9 08/31/2018   MPG 229 (H) 12/25/2012   No results found for: PROLACTIN Lab Results  Component Value Date   CHOL 163 08/31/2018   TRIG 108 08/31/2018   HDL 62 08/31/2018   CHOLHDL 2.6 08/31/2018   VLDL 22 08/31/2018   LDLCALC 79 08/31/2018    Current Medications: Current Facility-Administered Medications  Medication Dose Route Frequency Provider Last Rate Last Dose  . acetaminophen (TYLENOL) tablet 650 mg  650 mg Oral Q6H PRN Lindon Romp A, NP      . alum & mag hydroxide-simeth (MAALOX/MYLANTA) 200-200-20 MG/5ML suspension 30 mL  30 mL Oral Q4H PRN Lindon Romp A, NP      . aspirin EC tablet 81 mg  81 mg Oral QHS Lindon Romp A, NP   81 mg at 08/30/18 2129  . buPROPion (WELLBUTRIN XL) 24 hr tablet 300 mg  300 mg Oral Daily Lindon Romp A, NP   300 mg at 08/31/18 0803  . carvedilol (COREG) tablet 25 mg  25 mg Oral BID WC Lindon Romp A, NP   25 mg at 08/31/18 0803  . DULoxetine (CYMBALTA) DR capsule 60 mg  60 mg Oral QHS Lindon Romp A, NP   60 mg at 58/52/77 8242  . folic acid (FOLVITE) tablet 1 mg  1,000 mcg Oral QPM Lindon Romp A, NP      . gabapentin (NEURONTIN) capsule 300 mg  300 mg Oral QHS Lindon Romp A, NP   300 mg at 08/30/18 2128  . insulin aspart (novoLOG) injection 0-5 Units  0-5 Units Subcutaneous QHS Lindon Romp A, NP   4 Units at 08/30/18 2131  . insulin aspart (novoLOG) injection 0-9 Units  0-9 Units Subcutaneous TID WC Lindon Romp A, NP   5 Units at 08/31/18 1209  . insulin aspart (novoLOG) injection 5 Units  5 Units Subcutaneous TID WC Lindon Romp A, NP   5 Units at 08/31/18 1209  . [START ON 09/01/2018] insulin glargine (LANTUS) injection 50 Units  50 Units Subcutaneous Daily Sharma Covert, MD      . lamoTRIgine (LAMICTAL) tablet 200 mg  200 mg Oral Daily Lindon Romp A, NP   200 mg at 08/31/18 0803  . levothyroxine  (SYNTHROID, LEVOTHROID) tablet 150 mcg  150 mcg Oral Q0600 Lindon Romp A, NP   150 mcg at 08/31/18 3536  . magnesium hydroxide (MILK OF MAGNESIA) suspension 30 mL  30 mL Oral Daily PRN Lindon Romp A, NP      . pantoprazole (PROTONIX) EC tablet 40 mg  40 mg Oral Daily Lindon Romp A, NP   40 mg at 08/31/18 8453  . pravastatin (PRAVACHOL) tablet 80 mg  80 mg Oral QHS Lindon Romp A, NP      . QUEtiapine (SEROQUEL) tablet 25 mg  25 mg Oral QHS Lindon Romp A, NP   25 mg at 08/30/18 2129   PTA Medications: Medications Prior to Admission  Medication Sig Dispense Refill Last Dose  . acetaminophen (TYLENOL) 325 MG tablet Take 2 tablets (650 mg total) by mouth every 6 (six) hours as needed for mild pain. (May buy over the counter) 1 tablet 0 Taking  . aspirin EC 81 MG tablet Take 81 mg by mouth at bedtime.    08/27/2018 at 2100  . buPROPion (WELLBUTRIN XL) 300 MG 24 hr tablet Take 1 tablet (300 mg total) by mouth daily. For mood 30 tablet 0 08/27/2018 at 2100  . carvedilol (COREG) 25 MG tablet Take 25 mg by mouth 2 (two) times daily with a meal.   08/28/2018 at 0900  . Cholecalciferol (VITAMIN D3 SUPER STRENGTH) 50 MCG (2000 UT) TABS Take 1 tablet by mouth daily.    08/28/2018 at 0900  . clonazePAM (KLONOPIN) 0.5 MG tablet Take 1 tablet (0.5 mg total) by mouth 2 (two) times daily as needed for anxiety. 60 tablet 0 08/27/2018 at 2100  . Diclofenac Sodium (PENNSAID) 2 % SOLN Place 1 application onto the skin daily as needed (for back pain).   Taking  . dicyclomine (BENTYL) 10 MG capsule Take 10 mg by mouth 3 (three) times daily as needed for spasms.   Taking  . DULoxetine (CYMBALTA) 60 MG capsule Take 1 capsule (60 mg total) by mouth at bedtime. For mood 30 capsule 0 08/27/2018 at 2100  . fluticasone (FLONASE) 50 MCG/ACT nasal spray Place 2 sprays into the nose daily as needed for allergies.    08/27/2018 at Unknown time  . folic acid (FOLVITE) 646 MCG tablet Take 400 mcg by mouth every evening.    08/28/2018 at  0900  . furosemide (LASIX) 20 MG tablet Take 20 mg by mouth daily.    08/28/2018 at 0900  . gabapentin (NEURONTIN) 300 MG capsule Take 1 capsule (300 mg total) by mouth at bedtime. For neuropathy 30 capsule 0 08/27/2018 at 2100  . lamoTRIgine (LAMICTAL) 200 MG tablet Take 1 tablet (200 mg total) by mouth daily. For mood 30 tablet 0 08/27/2018 at 2100  . levothyroxine (SYNTHROID, LEVOTHROID) 150 MCG tablet Take 150 mcg by mouth daily before breakfast.   08/28/2018 at 0900  . lubiprostone (AMITIZA) 8 MCG capsule Take 8 mcg by mouth as needed for constipation.    Taking  . NOVOLOG 100 UNIT/ML injection Inject 2.425-2.95 Units into the skin continuous. Via pump 7pm to midnight 2.950 8:30am -7pm2.925 Midnight - 8:30 2.425  2 Taking  . Omega-3 Fatty Acids (FISH OIL PO) Take 1 capsule by mouth daily.   08/28/2018 at 0900  . omeprazole (PRILOSEC) 20 MG capsule Take 20 mg by mouth at bedtime.    08/27/2018 at 2100  . ondansetron (ZOFRAN ODT) 4 MG disintegrating tablet Take 1 tablet (4 mg total) by mouth every 8 (eight) hours as needed. (Patient taking differently: Take 4 mg by mouth every 8 (eight) hours as needed for nausea or vomiting. ) 20 tablet 6 Taking  . pravastatin (PRAVACHOL) 80 MG tablet Take 80 mg by mouth at bedtime.   08/27/2018 at 2100  . PROAIR HFA 108 (90 Base) MCG/ACT inhaler Inhale  1-2 puffs into the lungs every 6 (six) hours as needed for wheezing or shortness of breath.   1 Past Week at Unknown time  . QUEtiapine (SEROQUEL) 25 MG tablet Take 1 tablet (25 mg total) by mouth at bedtime. (Patient not taking: Reported on 08/29/2018) 30 tablet 0 Not Taking at Unknown time  . rizatriptan (MAXALT-MLT) 10 MG disintegrating tablet Take 1 tablet (10 mg total) as needed by mouth. May repeat in 2 hours if needed (Patient taking differently: Take 10 mg by mouth as needed for migraine. May repeat in 2 hours if needed) 15 tablet 11 Past Month at Unknown time  . traMADol (ULTRAM) 50 MG tablet Take 50 mg by mouth  2 (two) times daily.    Taking    Musculoskeletal: Strength & Muscle Tone: within normal limits Gait & Station: normal Patient leans: N/A  Psychiatric Specialty Exam: Physical Exam  Nursing note and vitals reviewed. Constitutional: She is oriented to person, place, and time. She appears well-developed and well-nourished.  HENT:  Head: Normocephalic and atraumatic.  Respiratory: Effort normal and breath sounds normal.  Neurological: She is alert and oriented to person, place, and time.    ROS  Blood pressure (!) 92/58, pulse 97, temperature 98 F (36.7 C), temperature source Oral, resp. rate 18, height 5\' 9"  (1.753 m), weight 132.5 kg.Body mass index is 43.12 kg/m.  General Appearance: Disheveled  Eye Contact:  Fair  Speech:  Normal Rate  Volume:  Decreased  Mood:  Anxious and Depressed  Affect:  Congruent  Thought Process:  Coherent and Descriptions of Associations: Intact  Orientation:  Full (Time, Place, and Person)  Thought Content:  Logical  Suicidal Thoughts:  No  Homicidal Thoughts:  No  Memory:  Immediate;   Fair Recent;   Fair Remote;   Fair  Judgement:  Intact  Insight:  Fair  Psychomotor Activity:  Psychomotor Retardation  Concentration:  Concentration: Fair and Attention Span: Fair  Recall:  [employer] of Knowledge:  Fair  Language:  Fair  Akathisia:  Negative  Handed:  Right  AIMS (if indicated):     Assets:  Desire for Improvement Financial Resources/Insurance Housing Intimacy Leisure Time Resilience Social Support  ADL's:  Intact  Cognition:  WNL  Sleep:  Number of Hours: 6.75    Treatment Plan Summary: Daily contact with patient to assess and evaluate symptoms and progress in treatment, Medication management and Plan : Patient is seen and examined.  Patient is a 48 year old female with the above-stated past psychiatric history is admitted secondary to suicidal ideation.  She also has a past medical history significant for hypertension, brittle  diabetes, hyperlipidemia, hypothyroidism.  She will be admitted to the hospital.  Should be integrated into the milieu.  She will be encouraged to attend groups.  She will be continued on her current psychiatric medications.  Her outpatient psychiatrist recommended starting Seroquel 25 mg p.o. nightly.  She received that last night for the first time.  She slept well with that, but is a little hung over this morning.  We will monitor that closely.  She stated that when she is not able to use her insulin pump that her endocrinologist had recommended Lantus insulin at the dosage equivalent to what she had been receiving of her basal insulin.  She was admitted on 40 units subcu of Lantus, and I am going to increase that to 50 units.  We will continue the sliding scale.  Her outpatient psychiatrist also recommended becoming involved  with an intensive outpatient program, and I have already spoken to social work about this.  I have also spoken to the social worker about getting the patient involved in either cognitive behavioral therapy or dialectic behavioral therapy for coping skills training.  We will contact her family for collateral information.  Her blood sugar this morning was 244, and again we will continue the insulin sliding scale as is.  Review of her laboratories revealed her blood sugar to be 244 this morning, her potassium was mildly elevated at 5.1.  On admission her blood sugar was 335, and that has at least gone down.  She has impaired renal function, and her creatinine is at 1.64.  This was actually better than it was 2 days ago.  Her liver function enzymes are normal.  Her hemoglobin hematocrit are normal.  Her hemoglobin A1c from 3/26 was 8.0.  TSH was 0.879.  She did have glucosuria.  Drug screen was positive for benzodiazepines, but she is prescribed this.  Hopefully we can get some of these recommendations implemented quickly.  Observation Level/Precautions:  15 minute checks  Laboratory:   Chemistry Profile  Psychotherapy:    Medications:    Consultations:    Discharge Concerns:    Estimated LOS:  Other:     Physician Treatment Plan for Primary Diagnosis: <principal problem not specified> Long Term Goal(s): Improvement in symptoms so as ready for discharge  Short Term Goals: Ability to identify changes in lifestyle to reduce recurrence of condition will improve, Ability to verbalize feelings will improve, Ability to disclose and discuss suicidal ideas, Ability to demonstrate self-control will improve, Ability to identify and develop effective coping behaviors will improve and Ability to maintain clinical measurements within normal limits will improve  Physician Treatment Plan for Secondary Diagnosis: Active Problems:   Severe recurrent major depression without psychotic features ([place])  Long Term Goal(s): Improvement in symptoms so as ready for discharge  Short Term Goals: Ability to identify changes in lifestyle to reduce recurrence of condition will improve, Ability to verbalize feelings will improve, Ability to disclose and discuss suicidal ideas, Ability to demonstrate self-control will improve, Ability to identify and develop effective coping behaviors will improve and Ability to maintain clinical measurements within normal limits will improve  I certify that inpatient services furnished can reasonably be expected to improve the patient's condition.    Sharma Covert, MD 3/26/202012:17 PM

## 2018-08-31 NOTE — Plan of Care (Signed)
  Problem: Education: Goal: Verbalization of understanding the information provided will improve Outcome: Progressing; Patient able to verbalize need to falls risk precautions and spoke with this writer about her medications this morning.    Problem: Activity: Goal: Sleeping patterns will improve; Patient reported adequate sleep. Outcome: Progressing

## 2018-08-31 NOTE — Progress Notes (Signed)
Patient ID: Jasmine [name], female   DOB: 07/02/1970, 48 y.o.   MRN: [id number] D: Patient observed crying as she leaves room. Pt reports she is not doing well dealing with disappointment after a boyfriend did not pickup her phone call multiples times. Pt reports she needs to learn to be responsible for herself. Pt mood and affect appears depressed and flat. Denies SI/HI/AVH and pain.  A: Support and encouragement offered as needed to express needs. Medications administered as prescribed.  R: Patient is safe and cooperative on unit. Will continue to monitor  for safety and stability.

## 2018-08-31 NOTE — Progress Notes (Signed)
On initial approach, patient is lying in bed, per her high fall risk status. Patient presents with depressed mood. Patient compliant with scheduled medications. Patient currently denies SI/HI/AVH.    Patient is educated about and provided medication per provider's orders. Patient safety maintained with q15 min safety checks and low fall risk precautions. Emotional support given, 1:1 interaction, and active listening provided. Patient encouraged to attend meals, groups, and work on treatment plan and goals. Labs, vital signs and patient behavior monitored throughout shift.    Patient contracts for safety with staff. Patient remains safe on the unit at this time and agrees to come to staff with any issues/concerns. Will continue to support and monitor.
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
## 2018-08-31 NOTE — BHH Suicide Risk Assessment (Signed)
[facility] INPATIENT:  Family/Significant Other Suicide Prevention Education  Suicide Prevention Education:  Education Completed; with husband, Jasmine [name] [phone]) has been identified by the patient as the family member/significant other with whom the patient will be residing, and identified as the person(s) who will aid the patient in the event of a mental health crisis (suicidal ideations/suicide attempt).  With written consent from the patient, the family member/significant other has been provided the following suicide prevention education, prior to the and/or following the discharge of the patient.  The suicide prevention education provided includes the following:  Suicide risk factors  Suicide prevention and interventions  National Suicide Hotline telephone number  [facility] assessment telephone number  [facility] Emergency Assistance [place] and/or Residential Mobile Crisis Unit telephone number  Request made of family/significant other to:  Remove weapons (e.g., guns, rifles, knives), all items previously/currently identified as safety concern.    Remove drugs/medications (over-the-counter, prescriptions, illicit drugs), all items previously/currently identified as a safety concern.  The family member/significant other verbalizes understanding of the suicide prevention education information provided.  The family member/significant other agrees to remove the items of safety concern listed above.  Patient's husband does not have any questions on concerns at this time. He states that he does not have any safety concerns regarding the patient once she returns home. He reports her main stressor is her strained relationship with her father, who recently moved int their home.  Jasmine [name] 08/31/2018, 10:58 AM

## 2018-08-31 NOTE — Progress Notes (Signed)
Adult Psychoeducational Group Note  Date:  08/31/2018 Time:  4:13 AM  Group Topic/Focus:  Wrap-Up Group:   The focus of this group is to help patients review their daily goal of treatment and discuss progress on daily workbooks.  Participation Level:  Active  Participation Quality:  Appropriate  Affect:  Appropriate  Cognitive:  Appropriate  Insight: Appropriate and Good  Engagement in Group:  Engaged  Modes of Intervention:  Discussion  Additional Comments:  Pt said her day was a 27. The one positive thing that happened she ate today and did not get sick afterward.  Lenice Llamas Long 08/31/2018, 4:13 AM
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
## 2018-09-01 LAB — GLUCOSE, CAPILLARY
Glucose-Capillary: 124 mg/dL — ABNORMAL HIGH (ref 70–99)
Glucose-Capillary: 110 mg/dL — ABNORMAL HIGH (ref 70–99)
Glucose-Capillary: 249 mg/dL — ABNORMAL HIGH (ref 70–99)
Glucose-Capillary: 249 mg/dL — ABNORMAL HIGH (ref 70–99)

## 2018-09-01 MED ORDER — ZIPRASIDONE HCL 40 MG PO CAPS
40.0000 mg | ORAL_CAPSULE | Freq: Every day | ORAL | Status: DC
  Administered 2018-09-01 – 2018-09-02 (×2): 40 mg via ORAL
  Filled 2018-09-01 (×3): qty 1

## 2018-09-01 NOTE — Plan of Care (Signed)
  Problem: Education: Goal: Knowledge of [facility] Education information/materials will improve Outcome: Progressing   Problem: Activity: Goal: Interest or engagement in activities will improve Outcome: Progressing   Problem: Health Behavior/Discharge Planning: Goal: Compliance with treatment plan for underlying cause of condition will improve Outcome: Progressing   Problem: Safety: Goal: Periods of time without injury will increase Outcome: Progressing

## 2018-09-01 NOTE — Progress Notes (Signed)
Adult Psychoeducational Group Note  Date:  09/01/2018 Time:  4:44 AM  Group Topic/Focus:  Wrap-Up Group:   The focus of this group is to help patients review their daily goal of treatment and discuss progress on daily workbooks.  Participation Level:  Active  Participation Quality:  Appropriate  Affect:  Appropriate  Cognitive:  Alert and Appropriate  Insight: Appropriate and Good  Engagement in Group:  Engaged  Modes of Intervention:  Discussion  Additional Comments:  Pt said her day was a 7. The one positive thing that happed today her IRS money was deposite into her bank account.  Jasmine [name] Long 09/01/2018, 4:44 AM

## 2018-09-01 NOTE — Progress Notes (Signed)
[facility] MD Progress Note  09/01/2018 10:59 AM Jasmine [name]  MRN:  [id number] Subjective:  Patient is a 48 year old female with a past psychiatric history significant for major depression, generalized anxiety disorder and reportedly bipolar disorder type II who presented to the [facility] emergency department on 08/28/2018. The patient had seen her outpatient psychiatrist ( Dr Vivien Presto 3/23. She told her psychiatrist at that time she had suicidal ideation with plan to overdose.   Objective: Patient is seen and examined.  Patient is a 48 year old female with the above-stated past psychiatric history who is seen in follow-up.  She feels slightly better today.  She was upset last night when her significant other would not answer the telephone, but she has spoken with him this morning.  We had a long discussion about means of communication between herself and her father.  We discussed trying to make things in a non-emotional state, and to consider getting a contract form signed that he would agree to make changes in his behavior.  We discussed going through a prescription of her conversation with her father, and practicing this so that she would not become emotionally overwrought during the discussion.  She denied any suicidal ideation.  She stated that the Seroquel was making her feel significantly hung over even at the 25 mg dose.Marland Kitchen  Her blood sugar this morning has significantly improved.  Her Accu-Chek this morning was 124.  Her creatinine is decreased back down to 1.64.  Her TSH was 0.879.  Her vital signs are stable, she is afebrile.  She slept 6.75 hours last night with the Seroquel.  She stated she had previously been on Geodon, and that was effective.  She stated it was stopped while she was being [facility].  Principal Problem: <principal problem not specified> Diagnosis: Active Problems:   Severe recurrent major depression without psychotic features ([place])  Total Time spent with patient: 20  minutes  Past Psychiatric History: See admission H&P  Past Medical History:  Past Medical History:  Diagnosis Date  . Anxiety   . Bipolar disorder ([place])   . Chronic fatigue   . CKD (chronic kidney disease), stage II   . Depression   . Diabetes mellitus   . DKA, type 1 ([place]) 11/04/2011  . Elevated cholesterol   . Fibromyalgia   . GERD (gastroesophageal reflux disease)   . Headache   . Hyperlipemia   . Hypertension   . Hypothyroidism   . IBS (irritable bowel syndrome)   . Obesity   . Sleep apnea    HAS C -PAP / DOES NOT USE  . Stress incontinence    Pt had surgery to correct this.  . Tachycardia   . Tobacco abuse   . UTI (lower urinary tract infection)     Past Surgical History:  Procedure Laterality Date  . INCONTINENCE SURGERY    . NASAL FRACTURE SURGERY    . ovary removed    . OVARY SURGERY    . PUBOVAGINAL SLING  08/16/2011   Procedure: [name];  Surgeon: Bernestine Amass, MD;  Location: [facility];  Service: Urology;  Laterality: N/A;         . UTERINE FIBROID SURGERY  2001   Family History:  Family History  Problem Relation Age of Onset  . Asthma Mother   . Bipolar disorder Mother   . Heart disease Father   . Lymphoma Father   . Hypertension Father   . Thyroid disease Father   .  Hyperlipidemia Father   . Diabetes Father   . Cancer Paternal Grandmother        lung and breast  . Bladder Cancer Paternal Grandfather   . Suicidality Maternal Grandfather   . Thyroid disease Brother    Family Psychiatric  History: See admission H&P Social History:  Social History   Substance and Sexual Activity  Alcohol Use No     Social History   Substance and Sexual Activity  Drug Use No    Social History   Socioeconomic History  . Marital status: Married    Spouse name: Not on file  . Number of children: 0  . Years of education: 52  . Highest education level: Not on file  Occupational History  . Occupation: Disabled  Social Needs  . Financial  resource strain: Not on file  . Food insecurity:    Worry: Not on file    Inability: Not on file  . Transportation needs:    Medical: Not on file    Non-medical: Not on file  Tobacco Use  . Smoking status: Former Smoker    Packs/day: 0.75    Years: 20.00    Pack years: 15.00    Types: Cigarettes    Last attempt to quit: 06/07/2012    Years since quitting: 6.2  . Smokeless tobacco: Never Used  Substance and Sexual Activity  . Alcohol use: No  . Drug use: No  . Sexual activity: Yes    Birth control/protection: I.U.D., Post-menopausal  Lifestyle  . Physical activity:    Days per week: Not on file    Minutes per session: Not on file  . Stress: Not on file  Relationships  . Social connections:    Talks on phone: Not on file    Gets together: Not on file    Attends religious service: Not on file    Active member of club or organization: Not on file    Attends meetings of clubs or organizations: Not on file    Relationship status: Not on file  Other Topics Concern  . Not on file  Social History Narrative   Lives at home with husband.   Right-handed.   Occasional caffeine use.   Additional Social History:                         Sleep: Good  Appetite:  Fair  Current Medications: Current Facility-Administered Medications  Medication Dose Route Frequency Provider Last Rate Last Dose  . acetaminophen (TYLENOL) tablet 650 mg  650 mg Oral Q6H PRN Lindon Romp A, NP      . alum & mag hydroxide-simeth (MAALOX/MYLANTA) 200-200-20 MG/5ML suspension 30 mL  30 mL Oral Q4H PRN Lindon Romp A, NP      . aspirin EC tablet 81 mg  81 mg Oral QHS Lindon Romp A, NP   81 mg at 08/31/18 2103  . buPROPion (WELLBUTRIN XL) 24 hr tablet 300 mg  300 mg Oral Daily Lindon Romp A, NP   300 mg at 09/01/18 0913  . carvedilol (COREG) tablet 25 mg  25 mg Oral BID WC Lindon Romp A, NP   25 mg at 09/01/18 0913  . DULoxetine (CYMBALTA) DR capsule 60 mg  60 mg Oral QHS Lindon Romp A, NP   60  mg at 08/31/18 2103  . folic acid (FOLVITE) tablet 1 mg  1,000 mcg Oral QPM Lindon Romp A, NP   1 mg at 08/31/18 1736  .  gabapentin (NEURONTIN) capsule 300 mg  300 mg Oral QHS Lindon Romp A, NP   300 mg at 08/31/18 2103  . insulin aspart (novoLOG) injection 0-5 Units  0-5 Units Subcutaneous QHS Lindon Romp A, NP   4 Units at 08/30/18 2131  . insulin aspart (novoLOG) injection 0-9 Units  0-9 Units Subcutaneous TID WC Lindon Romp A, NP   1 Units at 09/01/18 [phone]  . insulin aspart (novoLOG) injection 5 Units  5 Units Subcutaneous TID WC Lindon Romp A, NP   5 Units at 09/01/18 0711  . insulin glargine (LANTUS) injection 50 Units  50 Units Subcutaneous Daily Sharma Covert, MD   50 Units at 09/01/18 [phone]  . lamoTRIgine (LAMICTAL) tablet 200 mg  200 mg Oral Daily Lindon Romp A, NP   200 mg at 09/01/18 0913  . levothyroxine (SYNTHROID, LEVOTHROID) tablet 150 mcg  150 mcg Oral Q0600 Lindon Romp A, NP   150 mcg at 09/01/18 0708  . magnesium hydroxide (MILK OF MAGNESIA) suspension 30 mL  30 mL Oral Daily PRN Lindon Romp A, NP      . pantoprazole (PROTONIX) EC tablet 40 mg  40 mg Oral Daily Lindon Romp A, NP   40 mg at 09/01/18 0913  . pravastatin (PRAVACHOL) tablet 80 mg  80 mg Oral QHS Lindon Romp A, NP   80 mg at 08/31/18 2103  . ziprasidone (GEODON) capsule 40 mg  40 mg Oral Q supper Sharma Covert, MD        Lab Results:  Results for orders placed or performed during the hospital encounter of 08/30/18 (from the past 48 hour(s))  Glucose, capillary     Status: Abnormal   Collection Time: 08/30/18  8:27 PM  Result Value Ref Range   Glucose-Capillary 316 (H) 70 - 99 mg/dL  Glucose, capillary     Status: Abnormal   Collection Time: 08/31/18  6:10 AM  Result Value Ref Range   Glucose-Capillary 244 (H) 70 - 99 mg/dL   Comment 1 QC Due   Hemoglobin A1c     Status: Abnormal   Collection Time: 08/31/18  6:32 AM  Result Value Ref Range   Hgb A1c MFr Bld 8.0 (H) 4.8 - 5.6 %    Comment:  (NOTE) Pre diabetes:          5.7%-6.4% Diabetes:              >6.4% Glycemic control for   <7.0% adults with diabetes    Mean Plasma Glucose 182.9 mg/dL    Comment: Performed at [facility], [address] [place], [place] [address]  Lipid panel     Status: None   Collection Time: 08/31/18  6:32 AM  Result Value Ref Range   Cholesterol 163 0 - 200 mg/dL   Triglycerides 108 <150 mg/dL   HDL 62 >40 mg/dL   Total CHOL/HDL Ratio 2.6 RATIO   VLDL 22 0 - 40 mg/dL   LDL Cholesterol 79 0 - 99 mg/dL    Comment:        Total Cholesterol/HDL:CHD Risk Coronary Heart Disease Risk Table                     Men   Women  1/2 Average Risk   3.4   3.3  Average Risk       5.0   4.4  2 X Average Risk   9.6   7.1  3 X Average Risk  23.4   11.0        Use the calculated Patient Ratio above and the CHD Risk Table to determine the patient's CHD Risk.        ATP III CLASSIFICATION (LDL):  <100     mg/dL   Optimal  100-129  mg/dL   Near or Above                    Optimal  130-159  mg/dL   Borderline  160-189  mg/dL   High  >190     mg/dL   Very High Performed at [facility] [address] [place], [place] [address]   TSH     Status: None   Collection Time: 08/31/18  6:32 AM  Result Value Ref Range   TSH 0.879 0.350 - 4.500 uIU/mL    Comment: Performed by a 3rd Generation assay with a functional sensitivity of <=0.01 uIU/mL. Performed at [facility], [place] [address], [place], [place] [address]   Glucose, capillary     Status: Abnormal   Collection Time: 08/31/18 12:02 PM  Result Value Ref Range   Glucose-Capillary 252 (H) 70 - 99 mg/dL   Comment 1 Notify RN    Comment 2 Document in Chart   Glucose, capillary     Status: Abnormal   Collection Time: 08/31/18  4:58 PM  Result Value Ref Range   Glucose-Capillary 160 (H) 70 - 99 mg/dL   Comment 1 Notify RN    Comment 2 Document in Chart   Glucose, capillary     Status: Abnormal    Collection Time: 08/31/18  9:01 PM  Result Value Ref Range   Glucose-Capillary 101 (H) 70 - 99 mg/dL  Glucose, capillary     Status: Abnormal   Collection Time: 09/01/18  6:19 AM  Result Value Ref Range   Glucose-Capillary 124 (H) 70 - 99 mg/dL    Blood Alcohol level:  Lab Results  Component Value Date   ETH <10 08/28/2018   ETH <10 62/22/9798    Metabolic Disorder Labs: Lab Results  Component Value Date   HGBA1C 8.0 (H) 08/31/2018   MPG 182.9 08/31/2018   MPG 229 (H) 12/25/2012   No results found for: PROLACTIN Lab Results  Component Value Date   CHOL 163 08/31/2018   TRIG 108 08/31/2018   HDL 62 08/31/2018   CHOLHDL 2.6 08/31/2018   VLDL 22 08/31/2018   LDLCALC 79 08/31/2018    Physical Findings: AIMS: Facial and Oral Movements Muscles of Facial Expression: None, normal Lips and Perioral Area: None, normal Jaw: None, normal Tongue: None, normal,Extremity Movements Upper (arms, wrists, hands, fingers): None, normal Lower (legs, knees, ankles, toes): None, normal, Trunk Movements Neck, shoulders, hips: None, normal, Overall Severity Severity of abnormal movements (highest score from questions above): None, normal Incapacitation due to abnormal movements: None, normal Patient's awareness of abnormal movements (rate only patient's report): No Awareness, Dental Status Current problems with teeth and/or dentures?: Yes Does patient usually wear dentures?: No  CIWA:    COWS:     Musculoskeletal: Strength & Muscle Tone: within normal limits Gait & Station: normal Patient leans: N/A  Psychiatric Specialty Exam: Physical Exam  Nursing note and vitals reviewed. Constitutional: She is oriented to person, place, and time. She appears well-developed and well-nourished.  HENT:  Head: Normocephalic and atraumatic.  Respiratory: Effort normal.  Neurological: She is alert and oriented to person, place, and time.    ROS  Blood pressure 93/63, pulse 87, temperature 98  F (36.7 C), temperature source Oral, resp. rate 18, height 5\' 9"  (1.753 m), weight 132.5 kg.Body mass index is 43.12 kg/m.  General Appearance: Casual  Eye Contact:  Fair  Speech:  Normal Rate  Volume:  Decreased  Mood:  Anxious  Affect:  Congruent  Thought Process:  Coherent and Descriptions of Associations: Intact  Orientation:  Full (Time, Place, and Person)  Thought Content:  Logical  Suicidal Thoughts:  No  Homicidal Thoughts:  No  Memory:  Immediate;   Fair Recent;   Fair Remote;   Fair  Judgement:  Intact  Insight:  Fair  Psychomotor Activity:  Normal  Concentration:  Concentration: Fair and Attention Span: Fair  Recall:  [employer] of Knowledge:  Fair  Language:  Good  Akathisia:  Negative  Handed:  Right  AIMS (if indicated):     Assets:  Communication Skills Desire for Improvement Financial Resources/Insurance Housing Intimacy Resilience  ADL's:  Intact  Cognition:  WNL  Sleep:  Number of Hours: 6.75     Treatment Plan Summary: Daily contact with patient to assess and evaluate symptoms and progress in treatment, Medication management and Plan : Patient is seen and examined.  Patient is a 48 year old female with the above-stated past psychiatric history who is seen in follow-up.   Diagnosis: #1 bipolar disorder type II, #2 acute stress reaction, #3 poorly controlled diabetes mellitus, #4 chronic renal disease stage II  Patient's mood is stabilizing to a degree.  She is still frustrated over her life circumstances.  We discussed cognitive ways to be able to deal with this.  She slept better with the Seroquel, but the even the 25 mg dose makes her feel hung over.  We will stop this.  She stated she had previously taken Geodon and it was effective.  She stated [facility] and [facility].  She felt as though it had been effective when she was on it.  The only medication they discharged her on at that time was Lamictal.  I will restart Geodon at  40 mg p.o. every afternoon with food, and see if that assist in her sleep.  She stated she had previously had trazodone, and that was not effective.  I will go on to write for trazodone 50 mg p.o. nightly as needed in case the Geodon at the 40 mg dose is not effective in helping her rest and sleep.  Her blood sugars improved today, and her creatinine is dropped back down to 1.64.  No other changes in her medications at this point. 1.  Continue coated aspirin 81 mg p.o. daily for heart health. 2.  Continue Wellbutrin XL 300 mg p.o. daily for mood and anxiety. 3.  Continue Coreg 25 mg p.o. twice daily for hypertension and heart health. 4.  Continue Cymbalta 60 mg p.o. nightly for mood and anxiety. 5.  Continue folic acid 1 mg p.o. every afternoon for nutritional supplementation. 6.  Continue gabapentin 300 mg p.o. nightly for anxiety and sedation. 7.  Continue sliding scale insulin as well as Lantus 50 units subcu nightly for diabetes mellitus. 8.  Continue Lamictal 200 mg p.o. daily for mood stability. 9.  Continue level thyroxine 150 mcg p.o. daily for hypothyroidism. 10.  Continue Protonix 40 mg p.o. daily for GERD. 11.  Continue pravastatin 80 mg p.o. nightly for hyperlipidemia. 12.  Stop Seroquel. 13.  Start Geodon 40 mg p.o. every afternoon  meal.  This is for mood stability and sedation. 14.  Disposition planning-in progress.  Sharma Covert, MD 09/01/2018, 10:59 AM

## 2018-09-01 NOTE — Tx Team (Signed)
Interdisciplinary Treatment and Diagnostic Plan Update  09/01/2018 Time of Session:  Jasmine [name] MRN: [id number]  Principal Diagnosis: <principal problem not specified>  Secondary Diagnoses: Active Problems:   Severe recurrent major depression without psychotic features (HCC)   Current Medications:  Current Facility-Administered Medications  Medication Dose Route Frequency Provider Last Rate Last Dose  . acetaminophen (TYLENOL) tablet 650 mg  650 mg Oral Q6H PRN Lindon Romp A, NP      . alum & mag hydroxide-simeth (MAALOX/MYLANTA) 200-200-20 MG/5ML suspension 30 mL  30 mL Oral Q4H PRN Lindon Romp A, NP      . aspirin EC tablet 81 mg  81 mg Oral QHS Lindon Romp A, NP   81 mg at 08/31/18 2103  . buPROPion (WELLBUTRIN XL) 24 hr tablet 300 mg  300 mg Oral Daily Lindon Romp A, NP   300 mg at 09/01/18 0913  . carvedilol (COREG) tablet 25 mg  25 mg Oral BID WC Lindon Romp A, NP   25 mg at 09/01/18 0913  . DULoxetine (CYMBALTA) DR capsule 60 mg  60 mg Oral QHS Lindon Romp A, NP   60 mg at 08/31/18 2103  . folic acid (FOLVITE) tablet 1 mg  1,000 mcg Oral QPM Lindon Romp A, NP   1 mg at 08/31/18 1736  . gabapentin (NEURONTIN) capsule 300 mg  300 mg Oral QHS Lindon Romp A, NP   300 mg at 08/31/18 2103  . insulin aspart (novoLOG) injection 0-5 Units  0-5 Units Subcutaneous QHS Lindon Romp A, NP   4 Units at 08/30/18 2131  . insulin aspart (novoLOG) injection 0-9 Units  0-9 Units Subcutaneous TID WC Lindon Romp A, NP   1 Units at 09/01/18 [phone]  . insulin aspart (novoLOG) injection 5 Units  5 Units Subcutaneous TID WC Lindon Romp A, NP   5 Units at 09/01/18 0711  . insulin glargine (LANTUS) injection 50 Units  50 Units Subcutaneous Daily Sharma Covert, MD   50 Units at 09/01/18 [phone]  . lamoTRIgine (LAMICTAL) tablet 200 mg  200 mg Oral Daily Lindon Romp A, NP   200 mg at 09/01/18 0913  . levothyroxine (SYNTHROID, LEVOTHROID) tablet 150 mcg  150 mcg Oral Q0600 Lindon Romp A, NP    150 mcg at 09/01/18 0708  . magnesium hydroxide (MILK OF MAGNESIA) suspension 30 mL  30 mL Oral Daily PRN Lindon Romp A, NP      . pantoprazole (PROTONIX) EC tablet 40 mg  40 mg Oral Daily Lindon Romp A, NP   40 mg at 09/01/18 0913  . pravastatin (PRAVACHOL) tablet 80 mg  80 mg Oral QHS Lindon Romp A, NP   80 mg at 08/31/18 2103  . QUEtiapine (SEROQUEL) tablet 25 mg  25 mg Oral QHS Lindon Romp A, NP   25 mg at 08/31/18 2103   PTA Medications: Medications Prior to Admission  Medication Sig Dispense Refill Last Dose  . acetaminophen (TYLENOL) 325 MG tablet Take 2 tablets (650 mg total) by mouth every 6 (six) hours as needed for mild pain. (May buy over the counter) 1 tablet 0 Taking  . aspirin EC 81 MG tablet Take 81 mg by mouth at bedtime.    08/27/2018 at 2100  . buPROPion (WELLBUTRIN XL) 300 MG 24 hr tablet Take 1 tablet (300 mg total) by mouth daily. For mood 30 tablet 0 08/27/2018 at 2100  . carvedilol (COREG) 25 MG tablet Take 25 mg by mouth 2 (two) times daily with  a meal.   08/28/2018 at 0900  . Cholecalciferol (VITAMIN D3 SUPER STRENGTH) 50 MCG (2000 UT) TABS Take 1 tablet by mouth daily.    08/28/2018 at 0900  . clonazePAM (KLONOPIN) 0.5 MG tablet Take 1 tablet (0.5 mg total) by mouth 2 (two) times daily as needed for anxiety. 60 tablet 0 08/27/2018 at 2100  . Diclofenac Sodium (PENNSAID) 2 % SOLN Place 1 application onto the skin daily as needed (for back pain).   Taking  . dicyclomine (BENTYL) 10 MG capsule Take 10 mg by mouth 3 (three) times daily as needed for spasms.   Taking  . DULoxetine (CYMBALTA) 60 MG capsule Take 1 capsule (60 mg total) by mouth at bedtime. For mood 30 capsule 0 08/27/2018 at 2100  . fluticasone (FLONASE) 50 MCG/ACT nasal spray Place 2 sprays into the nose daily as needed for allergies.    08/27/2018 at Unknown time  . folic acid (FOLVITE) 664 MCG tablet Take 400 mcg by mouth every evening.    08/28/2018 at 0900  . furosemide (LASIX) 20 MG tablet Take 20 mg by  mouth daily.    08/28/2018 at 0900  . gabapentin (NEURONTIN) 300 MG capsule Take 1 capsule (300 mg total) by mouth at bedtime. For neuropathy 30 capsule 0 08/27/2018 at 2100  . lamoTRIgine (LAMICTAL) 200 MG tablet Take 1 tablet (200 mg total) by mouth daily. For mood 30 tablet 0 08/27/2018 at 2100  . levothyroxine (SYNTHROID, LEVOTHROID) 150 MCG tablet Take 150 mcg by mouth daily before breakfast.   08/28/2018 at 0900  . lubiprostone (AMITIZA) 8 MCG capsule Take 8 mcg by mouth as needed for constipation.    Taking  . NOVOLOG 100 UNIT/ML injection Inject 2.425-2.95 Units into the skin continuous. Via pump 7pm to midnight 2.950 8:30am -7pm2.925 Midnight - 8:30 2.425  2 Taking  . Omega-3 Fatty Acids (FISH OIL PO) Take 1 capsule by mouth daily.   08/28/2018 at 0900  . omeprazole (PRILOSEC) 20 MG capsule Take 20 mg by mouth at bedtime.    08/27/2018 at 2100  . ondansetron (ZOFRAN ODT) 4 MG disintegrating tablet Take 1 tablet (4 mg total) by mouth every 8 (eight) hours as needed. (Patient taking differently: Take 4 mg by mouth every 8 (eight) hours as needed for nausea or vomiting. ) 20 tablet 6 Taking  . pravastatin (PRAVACHOL) 80 MG tablet Take 80 mg by mouth at bedtime.   08/27/2018 at 2100  . PROAIR HFA 108 (90 Base) MCG/ACT inhaler Inhale 1-2 puffs into the lungs every 6 (six) hours as needed for wheezing or shortness of breath.   1 Past Week at Unknown time  . QUEtiapine (SEROQUEL) 25 MG tablet Take 1 tablet (25 mg total) by mouth at bedtime. (Patient not taking: Reported on 08/29/2018) 30 tablet 0 Not Taking at Unknown time  . rizatriptan (MAXALT-MLT) 10 MG disintegrating tablet Take 1 tablet (10 mg total) as needed by mouth. May repeat in 2 hours if needed (Patient taking differently: Take 10 mg by mouth as needed for migraine. May repeat in 2 hours if needed) 15 tablet 11 Past Month at Unknown time  . traMADol (ULTRAM) 50 MG tablet Take 50 mg by mouth 2 (two) times daily.    Taking    Patient  Stressors: Financial difficulties Marital or family conflict Occupational concerns  Patient Strengths: Ability for insight Average or above average intelligence Capable of independent living [employer] of knowledge Motivation for treatment/growth  Treatment Modalities: Medication Management, Group  therapy, Case management,  1 to 1 session with clinician, Psychoeducation, Recreational therapy.   Physician Treatment Plan for Primary Diagnosis: <principal problem not specified> Long Term Goal(s): Improvement in symptoms so as ready for discharge Improvement in symptoms so as ready for discharge   Short Term Goals: Ability to identify changes in lifestyle to reduce recurrence of condition will improve Ability to verbalize feelings will improve Ability to disclose and discuss suicidal ideas Ability to demonstrate self-control will improve Ability to identify and develop effective coping behaviors will improve Ability to maintain clinical measurements within normal limits will improve Ability to identify changes in lifestyle to reduce recurrence of condition will improve Ability to verbalize feelings will improve Ability to disclose and discuss suicidal ideas Ability to demonstrate self-control will improve Ability to identify and develop effective coping behaviors will improve Ability to maintain clinical measurements within normal limits will improve  Medication Management: Evaluate patient's response, side effects, and tolerance of medication regimen.  Therapeutic Interventions: 1 to 1 sessions, Unit Group sessions and Medication administration.  Evaluation of Outcomes: Progressing  Physician Treatment Plan for Secondary Diagnosis: Active Problems:   Severe recurrent major depression without psychotic features ([place])  Long Term Goal(s): Improvement in symptoms so as ready for discharge Improvement in symptoms so as ready for discharge   Short Term Goals: Ability to identify  changes in lifestyle to reduce recurrence of condition will improve Ability to verbalize feelings will improve Ability to disclose and discuss suicidal ideas Ability to demonstrate self-control will improve Ability to identify and develop effective coping behaviors will improve Ability to maintain clinical measurements within normal limits will improve Ability to identify changes in lifestyle to reduce recurrence of condition will improve Ability to verbalize feelings will improve Ability to disclose and discuss suicidal ideas Ability to demonstrate self-control will improve Ability to identify and develop effective coping behaviors will improve Ability to maintain clinical measurements within normal limits will improve     Medication Management: Evaluate patient's response, side effects, and tolerance of medication regimen.  Therapeutic Interventions: 1 to 1 sessions, Unit Group sessions and Medication administration.  Evaluation of Outcomes: Progressing   RN Treatment Plan for Primary Diagnosis: <principal problem not specified> Long Term Goal(s): Knowledge of disease and therapeutic regimen to maintain health will improve  Short Term Goals: Ability to participate in decision making will improve, Ability to verbalize feelings will improve, Ability to disclose and discuss suicidal ideas, Ability to identify and develop effective coping behaviors will improve and Compliance with prescribed medications will improve  Medication Management: RN will administer medications as ordered by provider, will assess and evaluate patient's response and provide education to patient for prescribed medication. RN will report any adverse and/or side effects to prescribing provider.  Therapeutic Interventions: 1 on 1 counseling sessions, Psychoeducation, Medication administration, Evaluate responses to treatment, Monitor vital signs and CBGs as ordered, Perform/monitor CIWA, COWS, AIMS and Fall Risk  screenings as ordered, Perform wound care treatments as ordered.  Evaluation of Outcomes: Progressing   LCSW Treatment Plan for Primary Diagnosis: <principal problem not specified> Long Term Goal(s): Safe transition to appropriate next level of care at discharge, Engage patient in therapeutic group addressing interpersonal concerns.  Short Term Goals: Engage patient in aftercare planning with referrals and resources  Therapeutic Interventions: Assess for all discharge needs, 1 to 1 time with Social worker, Explore available resources and support systems, Assess for adequacy in community support network, Educate family and significant other(s) on suicide prevention, Complete Psychosocial Assessment,  Interpersonal group therapy.  Evaluation of Outcomes: Progressing   Progress in Treatment: Attending groups: No. Participating in groups: No. Taking medication as prescribed: Yes. Toleration medication: Yes. Family/Significant other contact made: Yes, individual(s) contacted:  the patient's husband Patient understands diagnosis: Yes. Discussing patient identified problems/goals with staff: Yes. Medical problems stabilized or resolved: Yes. Denies suicidal/homicidal ideation: Yes. Issues/concerns per patient self-inventory: No. Other:   New problem(s) identified: None   New Short Term/Long Term Goal(s): medication stabilization, elimination of SI thoughts, development of comprehensive mental wellness plan.    Patient Goals:  I need to learn how to handle stress  Discharge Plan or Barriers: Patient plans to return home with her husband and father. She plans to follow up with Dr. Modesta Messing for medication management and Dr. Denice Paradise for therapy services. Patient expressed interest in possible PHP or IOP services in addition to her current providers. CSW will continue to follow.   Reason for Continuation of Hospitalization: Anxiety Depression Medication stabilization Suicidal  ideation  Estimated Length of Stay: 09/04/2018  Attendees: Patient: 09/01/2018 9:52 AM  Physician: Dr. Myles Lipps, MD 09/01/2018 9:52 AM  Nursing: Estill Bamberg.Loletha Grayer, RN 09/01/2018 9:52 AM  RN Care Manager: 09/01/2018 9:52 AM  Social Worker: Radonna Ricker, [place] 09/01/2018 9:52 AM  Recreational Therapist:  09/01/2018 9:52 AM  Other:  09/01/2018 9:52 AM  Other:  09/01/2018 9:52 AM  Other: 09/01/2018 9:52 AM    Scribe for Treatment Team: [name], [place] 09/01/2018 9:52 AM

## 2018-09-01 NOTE — Progress Notes (Addendum)
Patient ID: Jasmine [name], female   DOB: 1971-01-24, 48 y.o.   MRN: [id number]  Nursing Progress Note [phone]  On initial approach, patient is seen resting in bed and was called multiple times for morning medications. Patient presents with sullen affect and slow motor activity. Patient states she is "still tired from Seroquel". Patient compliant with scheduled medications. Patient currently denies SI/HI/AVH. Patient denies other concerns for Probation officer.   Patient is educated about and provided medication per provider's orders. Patient safety maintained with q15 min safety checks and low fall risk precautions. Emotional support given, 1:1 interaction, and active listening provided. Patient encouraged to attend meals, groups, and work on treatment plan and goals. Labs, vital signs and patient behavior monitored throughout shift. CBG monitored as ordered and as needed.  Patient contracts for safety with staff. Patient remains safe on the unit at this time and agrees to come to staff with any issues/concerns. Will continue to support and monitor.   Patient's self-inventory sheet Rated Energy Level  Normal  Rated Sleep  Good  Rated Appetite  Good  Rated Anxiety (0-10)  1  Rated Hopelessness (0-10)  1  Rated Depression (0-10)  0  Daily Goal  "how to accept myself and help with interpersonal relationship"  Any Additional Comments:

## 2018-09-01 NOTE — Progress Notes (Signed)
Recreation Therapy Notes  Date:  3.27.20 Time: 0930 Location: [address] Dayroom  Group Topic: Stress Management  Goal Area(s) Addresses:  Patient will identify positive stress management techniques. Patient will identify benefits of using stress management post d/c.  Intervention:  Stress Management  Activity :  Guided Imagery.  LRT introduced the stress management technique of guided imagery.  LRT read a script that lead patients on a journey on the beach at sunset.  Patients were to listen and follow along as script was read to engage in activity.  Education:  Stress Management, Discharge Planning.   Education Outcome: Acknowledges Education  Clinical Observations/Feedback:  Pt did not attend group.     Victorino Sparrow, LRT/CTRS         Victorino Sparrow A 09/01/2018 10:58 AM

## 2018-09-02 DIAGNOSIS — F3181 Bipolar II disorder: Secondary | ICD-10-CM

## 2018-09-02 LAB — GLUCOSE, CAPILLARY
Glucose-Capillary: 131 mg/dL — ABNORMAL HIGH (ref 70–99)
Glucose-Capillary: 182 mg/dL — ABNORMAL HIGH (ref 70–99)
Glucose-Capillary: 116 mg/dL — ABNORMAL HIGH (ref 70–99)
Glucose-Capillary: 298 mg/dL — ABNORMAL HIGH (ref 70–99)
Glucose-Capillary: 339 mg/dL — ABNORMAL HIGH (ref 70–99)

## 2018-09-02 NOTE — Progress Notes (Signed)
[facility] MD Progress Note  09/02/2018 11:05 AM Jasmine [name]  MRN:  [id number]   Subjective:  1/10 depression, denies anxiety, sleep was "alright" as she had a low blood glucose level disrupting it  Objective:  Patient is a 48 year old female with a past psychiatric history significant for major depression, generalized anxiety disorder and reportedly bipolar disorder type II who presented to the [facility] emergency department on 08/28/2018. The patient had seen her outpatient psychiatrist (Dr Vivien Presto 3/23. She told her psychiatrist at that time she had suicidal ideation with plan to overdose.   Patient seen and evaluated in her room.  Reports her depression is low, 1/10 with no suicidal ideations, no anxiety.  Sleep was disruptive due to low blood glucose but only went down to 110, 131 this morning.  States she feels her stress became overwhelming prior to admission that led to her suicidal ideations--father living with her and her husband with issues, lost her disability, "felt the weight of the world was on my shoulders".  Does not feel this anymore and developed new coping skills, plans to talk to her father and make a contract where he agrees to the rules or he will have to move out.  Feels she is ready to discharge tomorrow.  Denies adverse medication effects and somatic issues.    Principal Problem: Bipolar II disorder ([place]) Diagnosis: Principal Problem:   Bipolar II disorder ([place]) Active Problems:   Severe recurrent major depression without psychotic features ([place])  Total Time spent with patient: 30 minutes  Past Psychiatric History: bipolar disorder, anxiety  Past Medical History:  Past Medical History:  Diagnosis Date  . Anxiety   . Bipolar disorder ([place])   . Chronic fatigue   . CKD (chronic kidney disease), stage II   . Depression   . Diabetes mellitus   . DKA, type 1 ([place]) 11/04/2011  . Elevated cholesterol   . Fibromyalgia   . GERD (gastroesophageal reflux disease)    . Headache   . Hyperlipemia   . Hypertension   . Hypothyroidism   . IBS (irritable bowel syndrome)   . Obesity   . Sleep apnea    HAS C -PAP / DOES NOT USE  . Stress incontinence    Pt had surgery to correct this.  . Tachycardia   . Tobacco abuse   . UTI (lower urinary tract infection)     Past Surgical History:  Procedure Laterality Date  . INCONTINENCE SURGERY    . NASAL FRACTURE SURGERY    . ovary removed    . OVARY SURGERY    . PUBOVAGINAL SLING  08/16/2011   Procedure: [name];  Surgeon: Bernestine Amass, MD;  Location: [facility];  Service: Urology;  Laterality: N/A;         . UTERINE FIBROID SURGERY  2001   Family History:  Family History  Problem Relation Age of Onset  . Asthma Mother   . Bipolar disorder Mother   . Heart disease Father   . Lymphoma Father   . Hypertension Father   . Thyroid disease Father   . Hyperlipidemia Father   . Diabetes Father   . Cancer Paternal Grandmother        lung and breast  . Bladder Cancer Paternal Grandfather   . Suicidality Maternal Grandfather   . Thyroid disease Brother    Family Psychiatric  History: see above Social History:  Social History   Substance and Sexual Activity  Alcohol Use No  Social History   Substance and Sexual Activity  Drug Use No    Social History   Socioeconomic History  . Marital status: Married    Spouse name: Not on file  . Number of children: 0  . Years of education: 41  . Highest education level: Not on file  Occupational History  . Occupation: Disabled  Social Needs  . Financial resource strain: Not on file  . Food insecurity:    Worry: Not on file    Inability: Not on file  . Transportation needs:    Medical: Not on file    Non-medical: Not on file  Tobacco Use  . Smoking status: Former Smoker    Packs/day: 0.75    Years: 20.00    Pack years: 15.00    Types: Cigarettes    Last attempt to quit: 06/07/2012    Years since quitting: 6.2  . Smokeless tobacco:  Never Used  Substance and Sexual Activity  . Alcohol use: No  . Drug use: No  . Sexual activity: Yes    Birth control/protection: I.U.D., Post-menopausal  Lifestyle  . Physical activity:    Days per week: Not on file    Minutes per session: Not on file  . Stress: Not on file  Relationships  . Social connections:    Talks on phone: Not on file    Gets together: Not on file    Attends religious service: Not on file    Active member of club or organization: Not on file    Attends meetings of clubs or organizations: Not on file    Relationship status: Not on file  Other Topics Concern  . Not on file  Social History Narrative   Lives at home with husband.   Right-handed.   Occasional caffeine use.   Additional Social History:                         Sleep: Good  Appetite:  Good  Current Medications: Current Facility-Administered Medications  Medication Dose Route Frequency Provider Last Rate Last Dose  . acetaminophen (TYLENOL) tablet 650 mg  650 mg Oral Q6H PRN Lindon Romp A, NP      . alum & mag hydroxide-simeth (MAALOX/MYLANTA) 200-200-20 MG/5ML suspension 30 mL  30 mL Oral Q4H PRN Lindon Romp A, NP      . aspirin EC tablet 81 mg  81 mg Oral QHS Lindon Romp A, NP   81 mg at 09/01/18 2121  . buPROPion (WELLBUTRIN XL) 24 hr tablet 300 mg  300 mg Oral Daily Lindon Romp A, NP   300 mg at 09/02/18 0835  . carvedilol (COREG) tablet 25 mg  25 mg Oral BID WC Lindon Romp A, NP   25 mg at 09/02/18 0834  . DULoxetine (CYMBALTA) DR capsule 60 mg  60 mg Oral QHS Lindon Romp A, NP   60 mg at 55/97/41 6384  . folic acid (FOLVITE) tablet 1 mg  1,000 mcg Oral QPM Lindon Romp A, NP   1 mg at 09/01/18 1743  . gabapentin (NEURONTIN) capsule 300 mg  300 mg Oral QHS Lindon Romp A, NP   300 mg at 09/01/18 2121  . insulin aspart (novoLOG) injection 0-5 Units  0-5 Units Subcutaneous QHS Lindon Romp A, NP   2 Units at 09/01/18 2026  . insulin aspart (novoLOG) injection 0-9 Units   0-9 Units Subcutaneous TID WC Rozetta Nunnery, NP   1 Units at 09/02/18  1610  . insulin aspart (novoLOG) injection 5 Units  5 Units Subcutaneous TID WC Lindon Romp A, NP   5 Units at 09/02/18 [id number]  . insulin glargine (LANTUS) injection 50 Units  50 Units Subcutaneous Daily Sharma Covert, MD   50 Units at 09/02/18 [id number]  . lamoTRIgine (LAMICTAL) tablet 200 mg  200 mg Oral Daily Lindon Romp A, NP   200 mg at 09/02/18 0834  . levothyroxine (SYNTHROID, LEVOTHROID) tablet 150 mcg  150 mcg Oral Q0600 Lindon Romp A, NP   150 mcg at 09/02/18 0640  . magnesium hydroxide (MILK OF MAGNESIA) suspension 30 mL  30 mL Oral Daily PRN Lindon Romp A, NP      . pantoprazole (PROTONIX) EC tablet 40 mg  40 mg Oral Daily Lindon Romp A, NP   40 mg at 09/02/18 0835  . pravastatin (PRAVACHOL) tablet 80 mg  80 mg Oral QHS Lindon Romp A, NP   80 mg at 09/01/18 2121  . ziprasidone (GEODON) capsule 40 mg  40 mg Oral Q supper Sharma Covert, MD   40 mg at 09/01/18 1743    Lab Results:  Results for orders placed or performed during the hospital encounter of 08/30/18 (from the past 48 hour(s))  Glucose, capillary     Status: Abnormal   Collection Time: 08/31/18 12:02 PM  Result Value Ref Range   Glucose-Capillary 252 (H) 70 - 99 mg/dL   Comment 1 Notify RN    Comment 2 Document in Chart   Glucose, capillary     Status: Abnormal   Collection Time: 08/31/18  4:58 PM  Result Value Ref Range   Glucose-Capillary 160 (H) 70 - 99 mg/dL   Comment 1 Notify RN    Comment 2 Document in Chart   Glucose, capillary     Status: Abnormal   Collection Time: 08/31/18  9:01 PM  Result Value Ref Range   Glucose-Capillary 101 (H) 70 - 99 mg/dL  Glucose, capillary     Status: Abnormal   Collection Time: 09/01/18  6:19 AM  Result Value Ref Range   Glucose-Capillary 124 (H) 70 - 99 mg/dL  Glucose, capillary     Status: Abnormal   Collection Time: 09/01/18 12:15 PM  Result Value Ref Range   Glucose-Capillary 110 (H) 70 - 99  mg/dL  Glucose, capillary     Status: Abnormal   Collection Time: 09/01/18  5:10 PM  Result Value Ref Range   Glucose-Capillary 249 (H) 70 - 99 mg/dL   Comment 1 Notify RN    Comment 2 Document in Chart   Glucose, capillary     Status: Abnormal   Collection Time: 09/01/18  8:12 PM  Result Value Ref Range   Glucose-Capillary 249 (H) 70 - 99 mg/dL  Glucose, capillary     Status: Abnormal   Collection Time: 09/02/18  6:27 AM  Result Value Ref Range   Glucose-Capillary 131 (H) 70 - 99 mg/dL   Comment 1 Notify RN     Blood Alcohol level:  Lab Results  Component Value Date   ETH <10 08/28/2018   ETH <10 81/19/1478    Metabolic Disorder Labs: Lab Results  Component Value Date   HGBA1C 8.0 (H) 08/31/2018   MPG 182.9 08/31/2018   MPG 229 (H) 12/25/2012   No results found for: PROLACTIN Lab Results  Component Value Date   CHOL 163 08/31/2018   TRIG 108 08/31/2018   HDL 62 08/31/2018   CHOLHDL 2.6 08/31/2018  VLDL 22 08/31/2018   LDLCALC 79 08/31/2018    Physical Findings: AIMS: Facial and Oral Movements Muscles of Facial Expression: None, normal Lips and Perioral Area: None, normal Jaw: None, normal Tongue: None, normal,Extremity Movements Upper (arms, wrists, hands, fingers): None, normal Lower (legs, knees, ankles, toes): None, normal, Trunk Movements Neck, shoulders, hips: None, normal, Overall Severity Severity of abnormal movements (highest score from questions above): None, normal Incapacitation due to abnormal movements: None, normal Patient's awareness of abnormal movements (rate only patient's report): No Awareness, Dental Status Current problems with teeth and/or dentures?: Yes Does patient usually wear dentures?: No  CIWA:    COWS:     Musculoskeletal: Strength & Muscle Tone: within normal limits Gait & Station: normal Patient leans: N/A  Psychiatric Specialty Exam: Physical Exam  Nursing note and vitals reviewed. Constitutional: She is oriented  to person, place, and time. She appears well-developed and well-nourished.  HENT:  Head: Normocephalic.  Neck: Normal range of motion.  Respiratory: Effort normal.  Musculoskeletal: Normal range of motion.  Neurological: She is alert and oriented to person, place, and time.  Psychiatric: Her speech is normal and behavior is normal. Thought content normal. Cognition and memory are normal. She expresses impulsivity. She exhibits a depressed mood.    Review of Systems  Psychiatric/Behavioral: Positive for depression.  All other systems reviewed and are negative.   Blood pressure 101/62, pulse 75, temperature 98.3 F (36.8 C), resp. rate 16, height 5\' 9"  (1.753 m), weight 132.5 kg.Body mass index is 43.12 kg/m.  General Appearance: Casual  Eye Contact:  Fair  Speech:  Normal Rate  Volume:  Normal  Mood:  Depressed  Affect:  Congruent  Thought Process:  Coherent and Descriptions of Associations: Intact  Orientation:  Full (Time, Place, and Person)  Thought Content:  Rumination  Suicidal Thoughts:  No  Homicidal Thoughts:  No  Memory:  Immediate;   Fair Recent;   Fair Remote;   Fair  Judgement:  Fair  Insight:  Good  Psychomotor Activity:  Decreased  Concentration:  Concentration: Fair and Attention Span: Fair  Recall:  Good  Fund of Knowledge:  Fair  Language:  Good  Akathisia:  No  Handed:  Right  AIMS (if indicated):     Assets:  Housing Intimacy Leisure Time Physical Health Resilience Social Support  ADL's:  Intact  Cognition:  WNL  Sleep:  Number of Hours: 6.75    Treatment Plan Summary: Daily contact with patient to assess and evaluate symptoms and progress in treatment, Medication management and Plan bipolar affective disorder, depressed, severe without psychosis: -Continued Wellbutrin 300 mg daily -Continued Lamictal 200 mg daily -Continued Cymbalta 60 mg daily -Continued Geodon 40 mg at dinner  Anxiety: -Continued gabapentin 300 mg at  bedtime  Diabetes: -Continued insulin  HTN: -Continued aspirin 81 mg daily -Continued Coreg 25 mg BID  Hyperlipidemia: -Continued Pravachol 80 mg daily  Thyroid issues: -Continued synthroid 150 mcg daily  GERD: -Continued Protonix 40 mg daily  1. Will maintain Q 15 minutes observation for safety. Estimated LOS: 5-7 days 2. Patient will participate in group, milieu, and individual therapy.Psychotherapy: Social and Airline pilot, learning based strategies, cognitive behavioral, and family object relations individuation separation intervention psychotherapies can be considered.  3. Will continue to monitor patient's mood and behavior. 4. Social Work involvement to discuss discharge and concerns will also be addressed: Safety, stabilization, and access to medication 5. Discharge date TBD  Waylan Boga, NP 09/02/2018, 11:05 AM

## 2018-09-02 NOTE — Progress Notes (Signed)
Pt on unit in day room.  Pt is interacting with staff and other patients.  Pt sts she feels better since this morning when she was drowsy from Seroquel.  Pts BS was 249 and received coverage.  Pt denies SI, HI and AVH.  Pt denies pain or discomfort.  Pt verbally contracts for safety.  Pt given support and encouragement.  1:1 interaction and medication teaching  Pt remains safe on unit

## 2018-09-02 NOTE — B[facility] Group Notes (Signed)
[facility] Group Notes: (Clinical Social Work)   09/02/2018      Type of Therapy:  Group Therapy   Participation Level:  Did Not Attend - was invited both individually by MHT and by overhead announcement   Selmer Dominion, LCSW 09/02/2018, 1:20 PM

## 2018-09-02 NOTE — Progress Notes (Signed)
Nursing Progress Note: 7p-7a D: Pt currently presents with a anxious/pleasant affect and behavior. Interacting appropriately with the milieu. Pt reports good sleep during the previous night with current medication regimen. Pt did attend wrap-up group.  A: Pt provided with medications per providers orders. Pt's labs and vitals were monitored throughout the night. Pt supported emotionally and encouraged to express concerns and questions. Pt educated on medications.  R: Pt's safety ensured with 15 minute and environmental checks. Pt currently denies SI, HI, and AVH. Pt verbally contracts to seek staff if SI,HI, or AVH occurs and to consult with staff before acting on any harmful thoughts. Will continue to monitor.

## 2018-09-02 NOTE — Progress Notes (Signed)
DAR NOTE: Pt present with flat affect and calm mood in the unit. Pt has been in her room most of the of the time exercising social distancing. Pt reported fair sleep, managing her CBG well.  Pt denies physical pain, took all her meds as scheduled. As per self inventory, pt had a good night sleep, good appetite, normal energy, and good concentration. Pt rate depression at 1, hopeless ness at 0, and anxiety at 0. Pt's safety ensured with 15 minute and environmental checks. Pt currently denies SI/HI and A/V hallucinations. Pt verbally agrees to seek staff if SI/HI or A/VH occurs and to consult with staff before acting on these thoughts. Will continue POC.

## 2018-09-03 LAB — GLUCOSE, CAPILLARY: GLUCOSE-CAPILLARY: 169 mg/dL — AB (ref 70–99)

## 2018-09-03 MED ORDER — BUPROPION HCL ER (XL) 300 MG PO TB24: 300.0000 mg | ORAL_TABLET | Freq: Every day | ORAL | 0 refills | Status: DC

## 2018-09-03 MED ORDER — ZIPRASIDONE HCL 40 MG PO CAPS: 40.0000 mg | ORAL_CAPSULE | Freq: Every day | ORAL | 0 refills | Status: DC

## 2018-09-03 MED ORDER — LAMOTRIGINE 200 MG PO TABS: 200.0000 mg | ORAL_TABLET | Freq: Every day | ORAL | 0 refills | Status: DC

## 2018-09-03 MED ORDER — TRAZODONE HCL 50 MG PO TABS: 50.0000 mg | ORAL_TABLET | Freq: Every day | ORAL | 0 refills | Status: DC

## 2018-09-03 MED ORDER — GABAPENTIN 300 MG PO CAPS: 300.0000 mg | ORAL_CAPSULE | Freq: Every day | ORAL | 0 refills | Status: DC

## 2018-09-03 MED ORDER — TRAZODONE HCL 50 MG PO TABS
50.0000 mg | ORAL_TABLET | Freq: Every day | ORAL | Status: DC
  Administered 2018-09-03: 50 mg via ORAL
  Filled 2018-09-03 (×2): qty 1

## 2018-09-03 MED ORDER — INSULIN GLARGINE 100 UNIT/ML ~~LOC~~ SOLN: 50.0000 [IU] | Freq: Every day | SUBCUTANEOUS | 11 refills | Status: DC

## 2018-09-03 MED ORDER — DULOXETINE HCL 60 MG PO CPEP: 60.0000 mg | ORAL_CAPSULE | Freq: Every day | ORAL | 0 refills | Status: DC

## 2018-09-03 NOTE — [facility] Suicide Risk Assessment (Signed)
[facility] Discharge Suicide Risk Assessment   Principal Problem: Bipolar II disorder [facility]) Discharge Diagnoses: Principal Problem:   Bipolar II disorder ([place]) Active Problems:   Severe recurrent major depression without psychotic features ([place])   Total Time spent with patient: 15 minutes  Musculoskeletal: Strength & Muscle Tone: within normal limits Gait & Station: normal Patient leans: N/A  Psychiatric Specialty Exam: Review of Systems  All other systems reviewed and are negative.   Blood pressure 105/67, pulse 79, temperature 97.8 F (36.6 C), resp. rate 18, height 5\' 9"  (1.753 m), weight 132.5 kg.Body mass index is 43.12 kg/m.  General Appearance: Casual  Eye Contact::  Good  Speech:  Normal Rate409  Volume:  Normal  Mood:  Euthymic  Affect:  Congruent  Thought Process:  Coherent and Descriptions of Associations: Intact  Orientation:  Full (Time, Place, and Person)  Thought Content:  Logical  Suicidal Thoughts:  No  Homicidal Thoughts:  No  Memory:  Immediate;   Fair Recent;   Fair Remote;   Fair  Judgement:  Intact  Insight:  Fair  Psychomotor Activity:  Normal  Concentration:  Fair  Recall:  Good  Fund of Knowledge:Good  Language: Good  Akathisia:  Negative  Handed:  Right  AIMS (if indicated):     Assets:  Communication Skills Desire for Improvement Housing Intimacy Leisure Time Physical Health Resilience Social Support  Sleep:  Number of Hours: 5.25  Cognition: WNL  ADL's:  Intact   Mental Status Per Nursing Assessment::  On Admission:  Self-harm thoughts  Demographic Factors:  Caucasian, Low socioeconomic status and Unemployed  Loss Factors: NA  Historical Factors: Impulsivity  Risk Reduction Factors:   Sense of responsibility to family, Positive social support and Positive therapeutic relationship  Continued Clinical Symptoms:  Depression:   Impulsivity  Cognitive Features That Contribute To Risk:  None    Suicide Risk:  Minimal: No  identifiable suicidal ideation.  Patients presenting with no risk factors but with morbid ruminations; may be classified as minimal risk based on the severity of the depressive symptoms  Follow-up Information    Dr. Clarise Cruz Schneidmiller Follow up on 09/04/2018.   Why:  Therapy appointment is Monday, 3/30 at 3:00p.  At this time, Dr. Raynald Kemp will conduct the session over the telephone.  Contact information: [place] [address] Phone: [phone] Fax: [phone]       [facility] Follow up on 09/28/2018.   Specialty:  Behavioral Health Why:  Medication management appointment with Dr. Modesta Messing is Thursday, 4/23.  Please arrive 15 minutes early.  Contact information: [address] [place] [phone]          Plan Of Care/Follow-up recommendations:  Activity:  ad lib  Sharma Covert, MD 09/03/2018, 7:36 AM

## 2018-09-03 NOTE — Progress Notes (Signed)
  [facility] Adult Case Management Discharge Plan :  Will you be returning to the same living situation after discharge:  Yes,  with husband and father At discharge, do you have transportation home?: Yes,  arranged by patient Do you have the ability to pay for your medications: No.  Has insurance, but no income for co-pays.  Will need assistance  Release of information consent forms completed and turned in to Medical Records by CSW.   Patient to Follow up at: Follow-up Information    Dr. Clarise Cruz Schneidmiller Follow up on 09/04/2018.   Why:  Therapy appointment is Monday, 3/30 at 3:00p.  At this time, Dr. Raynald Kemp will conduct the session over the telephone.  Contact information: [place] [address] Phone: [phone] Fax: [phone]       [facility] Follow up on 09/28/2018.   Specialty:  Behavioral Health Why:  Medication management appointment with Dr. Modesta Messing is Thursday, 4/23.  Please arrive 15 minutes early.  Contact information: [address] [place] [phone]          Next level of care provider has access to [place] and Suicide Prevention discussed: Yes,  with husband  Have you used any form of tobacco in the last 30 days? (Cigarettes, Smokeless Tobacco, Cigars, and/or Pipes): No  Has patient been referred to the Quitline?: N/A patient is not a smoker  Patient has been referred for addiction treatment: N/A  Maretta Los, LCSW 09/03/2018, 8:09 AM

## 2018-09-03 NOTE — Progress Notes (Signed)
Pt denies SI/HI. Pt received both written and verbal discharge instructions. Pt verbalized understanding of discharge instructions. Pt agreed to med regimen and f/u appt. Pt received SRA, AVS, transitional record and prescriptions. Pt gathered belongings from room and locker. Pt safely discharged to the lobby and picked up by her husband.

## 2018-09-03 NOTE — Discharge Summary (Signed)
Physician Discharge Summary Note  Patient:  Jasmine [name] is an 48 y.o., female MRN:  [id number] DOB:  Dec 12, 1970 Patient phone:  [phone] (home)  Patient address:   [place] [address],  Total Time spent with patient: 15 minutes  Date of Admission:  08/30/2018 Date of Discharge: 09/03/2018  Reason for Admission: Depression    Per Assessment note: Jasmine [name], 48 y.o., female patient presented to [facility] with complaints of suicidal ideation and plan to overdose on medication; stating that she has no purpose and that she is a burden.  Today patient is stating that she is no longer suicidal and is wanting to go home.  Patient seen via telepsych by this provider; chart reviewed and consulted with Dr. Dwyane Dee on 08/29/18.  On evaluation Jasmine [name] reports she came to the hospital because she was having problems with her blood sugar.  Patient states that she was having a relationship on the side that was causing her depression but she ended it to day on the phone and feeling better.  States that her husband doesn't know about the affair.  Patient states that she was having plans to overdose but doesn't feel the same.  Patient states that she does have a prior history of suicide attempt 20 yrs ago.  At this time patient denies suicidal/homicidal ideation, psychosis, and paranoia   Principal Problem: Bipolar II disorder [facility]) Discharge Diagnoses: Principal Problem:   Bipolar II disorder ([place]) Active Problems:   Severe recurrent major depression without psychotic features [facility])   Past Psychiatric History:   Past Medical History:  Past Medical History:  Diagnosis Date  . Anxiety   . Bipolar disorder ([place])   . Chronic fatigue   . CKD (chronic kidney disease), stage II   . Depression   . Diabetes mellitus   . DKA, type 1 ([place]) 11/04/2011  . Elevated cholesterol   . Fibromyalgia   . GERD (gastroesophageal reflux disease)   . Headache   . Hyperlipemia   .  Hypertension   . Hypothyroidism   . IBS (irritable bowel syndrome)   . Obesity   . Sleep apnea    HAS C -PAP / DOES NOT USE  . Stress incontinence    Pt had surgery to correct this.  . Tachycardia   . Tobacco abuse   . UTI (lower urinary tract infection)     Past Surgical History:  Procedure Laterality Date  . INCONTINENCE SURGERY    . NASAL FRACTURE SURGERY    . ovary removed    . OVARY SURGERY    . PUBOVAGINAL SLING  08/16/2011   Procedure: [name];  Surgeon: Bernestine Amass, MD;  Location: [facility];  Service: Urology;  Laterality: N/A;         . UTERINE FIBROID SURGERY  2001   Family History:  Family History  Problem Relation Age of Onset  . Asthma Mother   . Bipolar disorder Mother   . Heart disease Father   . Lymphoma Father   . Hypertension Father   . Thyroid disease Father   . Hyperlipidemia Father   . Diabetes Father   . Cancer Paternal Grandmother        lung and breast  . Bladder Cancer Paternal Grandfather   . Suicidality Maternal Grandfather   . Thyroid disease Brother    Family Psychiatric  History: Social History:  Social History   Substance and Sexual Activity  Alcohol Use No  Social History   Substance and Sexual Activity  Drug Use No    Social History   Socioeconomic History  . Marital status: Married    Spouse name: Not on file  . Number of children: 0  . Years of education: 16  . Highest education level: Not on file  Occupational History  . Occupation: Disabled  Social Needs  . Financial resource strain: Not on file  . Food insecurity:    Worry: Not on file    Inability: Not on file  . Transportation needs:    Medical: Not on file    Non-medical: Not on file  Tobacco Use  . Smoking status: Former Smoker    Packs/day: 0.75    Years: 20.00    Pack years: 15.00    Types: Cigarettes    Last attempt to quit: 06/07/2012    Years since quitting: 6.2  . Smokeless tobacco: Never Used  Substance and Sexual Activity   . Alcohol use: No  . Drug use: No  . Sexual activity: Yes    Birth control/protection: I.U.D., Post-menopausal  Lifestyle  . Physical activity:    Days per week: Not on file    Minutes per session: Not on file  . Stress: Not on file  Relationships  . Social connections:    Talks on phone: Not on file    Gets together: Not on file    Attends religious service: Not on file    Active member of club or organization: Not on file    Attends meetings of clubs or organizations: Not on file    Relationship status: Not on file  Other Topics Concern  . Not on file  Social History Narrative   Lives at home with husband.   Right-handed.   Occasional caffeine use.    Hospital Course: Jasmine [name] was admitted for Bipolar II disorder [facility]) and  crisis management.  Pt was treated discharged with the medications listed below under Medication List.  Medical problems were identified and treated as needed.  Home medications were restarted as appropriate.  Improvement was monitored by observation and Jasmine [name] 's daily report of symptom reduction.  Emotional and mental status was monitored by daily self-inventory reports completed by Jasmine [name] and clinical staff.         Jasmine [name] was evaluated by the treatment team for stability and plans for continued recovery upon discharge. Jasmine [name] 's motivation was an integral factor for scheduling further treatment. Employment, transportation, bed availability, health status, family support, and any pending legal issues were also considered during hospital stay. Pt was offered further treatment options upon discharge including but not limited to Residential, Intensive Outpatient, and Outpatient treatment.  Jasmine [name] will follow up with the services as listed below under Follow Up Information.     Upon completion of this admission the patient was both mentally and medically stable for discharge denying  suicidal/homicidal ideation, auditory/visual/tactile hallucinations, delusional thoughts and paranoia.     Jasmine [name] responded well to treatment with Wellbutrin 300  mg, Lamictal 200 mg, Geodon 40mg , Cymbalta 60mg  and trazodone 50 mg without adverse effects. Pt demonstrated improvement without reported or observed adverse effects to the point of stability appropriate for outpatient management. Pertinent labs include: A1C 8.0, TSH (0.879) which outpatient follow-up is necessary for lab recheck as mentioned below. Reviewed CBC, CMP, BAL, and UDS; all unremarkable aside from noted exceptions.   Physical Findings: AIMS: Facial and  Oral Movements Muscles of Facial Expression: None, normal Lips and Perioral Area: None, normal Jaw: None, normal Tongue: None, normal,Extremity Movements Upper (arms, wrists, hands, fingers): None, normal Lower (legs, knees, ankles, toes): None, normal, Trunk Movements Neck, shoulders, hips: None, normal, Overall Severity Severity of abnormal movements (highest score from questions above): None, normal Incapacitation due to abnormal movements: None, normal Patient's awareness of abnormal movements (rate only patient's report): No Awareness, Dental Status Current problems with teeth and/or dentures?: Yes Does patient usually wear dentures?: No  CIWA:    COWS:     Musculoskeletal: Strength & Muscle Tone: within normal limits Gait & Station: normal Patient leans: N/A  Psychiatric Specialty Exam: See SRA by MD  Physical Exam  Nursing note and vitals reviewed.   Review of Systems  Psychiatric/Behavioral: Negative for suicidal ideas. Depression: stable. The patient is not nervous/anxious.   All other systems reviewed and are negative.   Blood pressure 105/67, pulse 79, temperature 97.8 F (36.6 C), resp. rate 18, height 5\' 9"  (1.753 m), weight 132.5 kg.Body mass index is 43.12 kg/m.   Have you used any form of tobacco in the last 30 days? (Cigarettes,  Smokeless Tobacco, Cigars, and/or Pipes): No  Has this patient used any form of tobacco in the last 30 days? (Cigarettes, Smokeless Tobacco, Cigars, and/or Pipes) , No  Blood Alcohol level:  Lab Results  Component Value Date   ETH <10 08/28/2018   ETH <10 79/07/4095    Metabolic Disorder Labs:  Lab Results  Component Value Date   HGBA1C 8.0 (H) 08/31/2018   MPG 182.9 08/31/2018   MPG 229 (H) 12/25/2012   No results found for: PROLACTIN Lab Results  Component Value Date   CHOL 163 08/31/2018   TRIG 108 08/31/2018   HDL 62 08/31/2018   CHOLHDL 2.6 08/31/2018   VLDL 22 08/31/2018   LDLCALC 79 08/31/2018    See Psychiatric Specialty Exam and Suicide Risk Assessment completed by Attending Physician prior to discharge.  Discharge destination:  Home  Is patient on multiple antipsychotic therapies at discharge:  No   Has Patient had three or more failed trials of antipsychotic monotherapy by history:  No  Recommended Plan for Multiple Antipsychotic Therapies: NA  Discharge Instructions    Diet - low sodium heart healthy   Complete by:  As directed    Discharge instructions   Complete by:  As directed    Take all medications as prescribed. Keep all follow-up appointments as scheduled.  Do not consume alcohol or use illegal drugs while on prescription medications. Report any adverse effects from your medications to your primary care provider promptly.  In the event of recurrent symptoms or worsening symptoms, call 911, a crisis hotline, or go to the nearest emergency department for evaluation.   Increase activity slowly   Complete by:  As directed      Allergies as of 09/03/2018      Reactions   Ciprofloxacin Swelling, Other (See Comments)   Per pt caused lips swell and nauseous feeling   Levaquin [levofloxacin] Swelling, Other (See Comments)   Per pt caused lips swell and nauseous feeling   Buspar [buspirone] Other (See Comments)   abd cramping   Linaclotide Other  (See Comments)   Advair Diskus [fluticasone-salmeterol] Other (See Comments)   Thrush    Biaxin [clarithromycin] Rash   Hydroxyzine Palpitations      Medication List    STOP taking these medications   acetaminophen 325 MG tablet Commonly known  as:  TYLENOL   clonazePAM 0.5 MG tablet Commonly known as:  KLONOPIN   Pennsaid 2 % Soln Generic drug:  Diclofenac Sodium     TAKE these medications     Indication  aspirin EC 81 MG tablet Take 81 mg by mouth at bedtime.  Indication:  Antiplatelet   buPROPion 300 MG 24 hr tablet Commonly known as:  WELLBUTRIN XL Take 1 tablet (300 mg total) by mouth daily. Start taking on:  September 04, 2018 What changed:  additional instructions  Indication:  Major Depressive Disorder, Mood   carvedilol 25 MG tablet Commonly known as:  COREG Take 25 mg by mouth 2 (two) times daily with a meal.  Indication:  High Blood Pressure Disorder   dicyclomine 10 MG capsule Commonly known as:  BENTYL Take 10 mg by mouth 3 (three) times daily as needed for spasms.  Indication:  Irritable Bowel Syndrome   DULoxetine 60 MG capsule Commonly known as:  CYMBALTA Take 1 capsule (60 mg total) by mouth at bedtime. What changed:  additional instructions  Indication:  Major Depressive Disorder, Mood   FISH OIL PO Take 1 capsule by mouth daily.  Indication:  Supplementation   fluticasone 50 MCG/ACT nasal spray Commonly known as:  FLONASE Place 2 sprays into the nose daily as needed for allergies.  Indication:  Allergic Rhinitis   folic acid 024 MCG tablet Commonly known as:  FOLVITE Take 400 mcg by mouth every evening.  Indication:  Anemia From Inadequate Folic Acid   furosemide 20 MG tablet Commonly known as:  LASIX Take 20 mg by mouth daily.  Indication:  High Blood Pressure Disorder   gabapentin 300 MG capsule Commonly known as:  NEURONTIN Take 1 capsule (300 mg total) by mouth at bedtime. What changed:  additional instructions  Indication:   Diabetes with Nerve Disease   insulin glargine 100 UNIT/ML injection Commonly known as:  LANTUS Inject 0.5 mLs (50 Units total) into the skin daily. Start taking on:  September 04, 2018  Indication:  Type 2 Diabetes   lamoTRIgine 200 MG tablet Commonly known as:  LAMICTAL Take 1 tablet (200 mg total) by mouth daily. Start taking on:  September 04, 2018 What changed:  additional instructions  Indication:  Mood   levothyroxine 150 MCG tablet Commonly known as:  SYNTHROID, LEVOTHROID Take 150 mcg by mouth daily before breakfast.  Indication:  Underactive Thyroid   lubiprostone 8 MCG capsule Commonly known as:  AMITIZA Take 8 mcg by mouth as needed for constipation.  Indication:  Constipation caused by Irritable Bowel Syndrome   NovoLOG 100 UNIT/ML injection Generic drug:  insulin aspart Inject 2.425-2.95 Units into the skin continuous. Via pump 7pm to midnight 2.950 8:30am -7pm2.925 Midnight - 8:30 2.425  Indication:  Insulin-Dependent Diabetes   omeprazole 20 MG capsule Commonly known as:  PRILOSEC Take 20 mg by mouth at bedtime.  Indication:  Gastroesophageal Reflux Disease   ondansetron 4 MG disintegrating tablet Commonly known as:  Zofran ODT Take 1 tablet (4 mg total) by mouth every 8 (eight) hours as needed. What changed:  reasons to take this  Indication:  Nausea and Vomiting   pravastatin 80 MG tablet Commonly known as:  PRAVACHOL Take 80 mg by mouth at bedtime.  Indication:  High Amount of Fats in the Blood   ProAir HFA 108 (90 Base) MCG/ACT inhaler Generic drug:  albuterol Inhale 1-2 puffs into the lungs every 6 (six) hours as needed for wheezing or shortness of breath.  Indication:  Shortness of breath   QUEtiapine 25 MG tablet Commonly known as:  SEROQUEL Take 1 tablet (25 mg total) by mouth at bedtime.  Indication:  Depressive Phase of Manic-Depression, Major Depressive Disorder   rizatriptan 10 MG disintegrating tablet Commonly known as:   Maxalt-MLT Take 1 tablet (10 mg total) as needed by mouth. May repeat in 2 hours if needed What changed:  reasons to take this  Indication:  Migraine Headache   traMADol 50 MG tablet Commonly known as:  ULTRAM Take 50 mg by mouth 2 (two) times daily.  Indication:  Pain   traZODone 50 MG tablet Commonly known as:  DESYREL Take 1 tablet (50 mg total) by mouth at bedtime.  Indication:  Trouble Sleeping   Vitamin D3 Super Strength 50 MCG (2000 UT) Tabs Generic drug:  Cholecalciferol Take 1 tablet by mouth daily.  Indication:  vit d   ziprasidone 40 MG capsule Commonly known as:  GEODON Take 1 capsule (40 mg total) by mouth daily with supper.  Indication:  Manic-Depression      Follow-up Information    Dr. Clarise Cruz Schneidmiller Follow up on 09/04/2018.   Why:  Therapy appointment is Monday, 3/30 at 3:00p.  At this time, Dr. Raynald Kemp will conduct the session over the telephone.  Contact information: [place] [address] Phone: [phone] Fax: [phone]       [facility] Follow up on 09/28/2018.   Specialty:  Behavioral Health Why:  Medication management appointment with Dr. Modesta Messing is Thursday, 4/23.  Please arrive 15 minutes early.  Contact information: [address] [place] [phone]          Follow-up recommendations:  Activity:  as tolerated Diet:  heart healthy  Comments:  Take all medications as prescribed. Keep all follow-up appointments as scheduled.  Do not consume alcohol or use illegal drugs while on prescription medications. Report any adverse effects from your medications to your primary care provider promptly.  In the event of recurrent symptoms or worsening symptoms, call 911, a crisis hotline, or go to the nearest emergency department for evaluation.   Signed: Derrill Center, NP 09/03/2018, 9:12 AM
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
## 2018-09-26 ENCOUNTER — Encounter: Payer: Self-pay | Admitting: *Deleted

## 2018-09-27 ENCOUNTER — Other Ambulatory Visit: Payer: Self-pay

## 2018-09-27 ENCOUNTER — Ambulatory Visit (INDEPENDENT_AMBULATORY_CARE_PROVIDER_SITE_OTHER): Payer: BLUE CROSS/BLUE SHIELD | Admitting: Neurology

## 2018-09-27 ENCOUNTER — Encounter: Payer: Self-pay | Admitting: Neurology

## 2018-09-27 DIAGNOSIS — R42 Dizziness and giddiness: Secondary | ICD-10-CM | POA: Insufficient documentation

## 2018-09-27 DIAGNOSIS — IMO0002 Reserved for concepts with insufficient information to code with codable children: Secondary | ICD-10-CM

## 2018-09-27 DIAGNOSIS — G43709 Chronic migraine without aura, not intractable, without status migrainosus: Secondary | ICD-10-CM | POA: Diagnosis not present

## 2018-09-27 DIAGNOSIS — Z79899 Other long term (current) drug therapy: Secondary | ICD-10-CM | POA: Diagnosis not present

## 2018-09-27 DIAGNOSIS — H814 Vertigo of central origin: Secondary | ICD-10-CM | POA: Diagnosis not present
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
## 2018-09-28 ENCOUNTER — Other Ambulatory Visit: Payer: Self-pay

## 2018-09-28 ENCOUNTER — Encounter (HOSPITAL_COMMUNITY): Payer: BLUE CROSS/BLUE SHIELD | Admitting: Psychiatry
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
## 2018-09-28 NOTE — Progress Notes (Signed)
This encounter was created in error - please disregard.

## 2018-10-02 ENCOUNTER — Other Ambulatory Visit: Payer: Self-pay

## 2018-10-02 ENCOUNTER — Institutional Professional Consult (permissible substitution): Payer: BLUE CROSS/BLUE SHIELD | Admitting: Neurology

## 2018-10-02 ENCOUNTER — Ambulatory Visit (INDEPENDENT_AMBULATORY_CARE_PROVIDER_SITE_OTHER): Payer: BLUE CROSS/BLUE SHIELD | Admitting: Psychiatry

## 2018-10-02 ENCOUNTER — Encounter (HOSPITAL_COMMUNITY): Payer: Self-pay | Admitting: Psychiatry

## 2018-10-02 DIAGNOSIS — F3181 Bipolar II disorder: Secondary | ICD-10-CM

## 2018-10-02 MED ORDER — BUPROPION HCL ER (XL) 300 MG PO TB24: 300.0000 mg | ORAL_TABLET | Freq: Every day | ORAL | 0 refills | Status: DC

## 2018-10-02 MED ORDER — LAMOTRIGINE 200 MG PO TABS: 200.0000 mg | ORAL_TABLET | Freq: Every day | ORAL | 0 refills | Status: DC

## 2018-10-02 MED ORDER — QUETIAPINE FUMARATE 25 MG PO TABS: 12.5000 mg | ORAL_TABLET | Freq: Every day | ORAL | 0 refills | Status: DC

## 2018-10-02 MED ORDER — DULOXETINE HCL 60 MG PO CPEP: 60.0000 mg | ORAL_CAPSULE | Freq: Every day | ORAL | 0 refills | Status: DC

## 2018-10-02 MED ORDER — LORAZEPAM 0.5 MG PO TABS: 0.5000 mg | ORAL_TABLET | Freq: Two times a day (BID) | ORAL | 0 refills | Status: DC | PRN

## 2018-10-02 NOTE — Patient Instructions (Signed)
1. Continue lamotrigine 200 mg daily  2.Continue duloxetine 60 mgdaily (limited benefit from higher dose) 3.Continuebupropion300 mg daily (maximum dose given her renal function) 4. Reinitiate quetiapine 12.5 mg at night  5. Discontinue clonazepam, Geodon 6. Start lorazepam 0.5 mg twice a day as needed for anxiety 7. Next appointment 5/26 at 3:50 for 30 mins 8. Referral to IOP

## 2018-10-03 ENCOUNTER — Telehealth (HOSPITAL_COMMUNITY): Payer: Self-pay | Admitting: Professional

## 2018-10-03 ENCOUNTER — Telehealth: Payer: Self-pay | Admitting: Neurology

## 2018-10-03 NOTE — Telephone Encounter (Signed)
[employer] Auth: [place] I spoke to [place] Ref # [id number] order sent to [facility]. They will reach out to the pt to schedule.

## 2018-10-04 ENCOUNTER — Ambulatory Visit (HOSPITAL_COMMUNITY): Payer: BLUE CROSS/BLUE SHIELD

## 2018-10-04 ENCOUNTER — Telehealth (HOSPITAL_COMMUNITY): Payer: Self-pay | Admitting: Professional

## 2018-10-05 ENCOUNTER — Telehealth (HOSPITAL_COMMUNITY): Payer: Self-pay | Admitting: Professional

## 2018-10-05 ENCOUNTER — Other Ambulatory Visit (HOSPITAL_COMMUNITY): Payer: BLUE CROSS/BLUE SHIELD | Attending: Psychiatry

## 2018-10-05 ENCOUNTER — Other Ambulatory Visit: Payer: Self-pay

## 2018-10-06 ENCOUNTER — Telehealth (HOSPITAL_COMMUNITY): Payer: Self-pay | Admitting: Professional

## 2018-10-12 ENCOUNTER — Other Ambulatory Visit: Payer: Self-pay

## 2018-10-12 ENCOUNTER — Ambulatory Visit
Admission: RE | Admit: 2018-10-12 | Discharge: 2018-10-12 | Disposition: A | Discharge status: Home / Self Care | Payer: BLUE CROSS/BLUE SHIELD | Source: Ambulatory Visit | Attending: Neurology | Admitting: Neurology

## 2018-10-12 DIAGNOSIS — H814 Vertigo of central origin: Secondary | ICD-10-CM

## 2018-10-25 ENCOUNTER — Telehealth (HOSPITAL_COMMUNITY): Payer: Self-pay | Admitting: Psychiatry

## 2018-10-25 NOTE — Telephone Encounter (Signed)
LVM per provider:  It seems like the patient has not joined PHP yet. Could you contact the patient and advise her to reach [phone]/[place], PHP therapist so that she can join the program? Thanks.
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
## 2018-10-25 NOTE — Telephone Encounter (Signed)
It seems like the patient has not joined PHP yet. Could you contact the patient and advise her to reach [phone]/[place], PHP therapist so that she can join the program? Thanks.

## 2018-10-31 ENCOUNTER — Ambulatory Visit (HOSPITAL_COMMUNITY): Payer: BLUE CROSS/BLUE SHIELD | Admitting: Psychiatry

## 2018-10-31 ENCOUNTER — Other Ambulatory Visit: Payer: Self-pay

## 2018-10-31 ENCOUNTER — Encounter (HOSPITAL_COMMUNITY): Payer: Self-pay | Admitting: Psychiatry

## 2018-10-31 ENCOUNTER — Ambulatory Visit (INDEPENDENT_AMBULATORY_CARE_PROVIDER_SITE_OTHER): Payer: BLUE CROSS/BLUE SHIELD | Admitting: Psychiatry

## 2018-10-31 DIAGNOSIS — F3181 Bipolar II disorder: Secondary | ICD-10-CM | POA: Diagnosis not present

## 2018-10-31 MED ORDER — BUPROPION HCL ER (XL) 300 MG PO TB24: 300.0000 mg | ORAL_TABLET | Freq: Every day | ORAL | 0 refills | Status: DC

## 2018-10-31 MED ORDER — DULOXETINE HCL 60 MG PO CPEP: 60.0000 mg | ORAL_CAPSULE | Freq: Every day | ORAL | 0 refills | Status: DC

## 2018-10-31 MED ORDER — LORAZEPAM 0.5 MG PO TABS: 0.5000 mg | ORAL_TABLET | Freq: Two times a day (BID) | ORAL | 0 refills | Status: DC | PRN

## 2018-10-31 MED ORDER — LAMOTRIGINE 200 MG PO TABS: 200.0000 mg | ORAL_TABLET | Freq: Every day | ORAL | 0 refills | Status: DC

## 2018-10-31 NOTE — Patient Instructions (Addendum)
1. Continue lamotrigine 200 mg daily  2.Continue duloxetine 60 mgdaily  3.Continuebupropion300 mg daily 4. Hold quetiapine 5. Start lorazepam 0.5 mg twice a day as needed for anxiety 7. Please contact PHP [phone] [place] [id number]  Will make referral for ECT

## 2018-11-13 ENCOUNTER — Other Ambulatory Visit: Payer: Self-pay

## 2018-11-13 ENCOUNTER — Ambulatory Visit (INDEPENDENT_AMBULATORY_CARE_PROVIDER_SITE_OTHER): Payer: BLUE CROSS/BLUE SHIELD | Admitting: Psychiatry

## 2018-11-13 DIAGNOSIS — F3181 Bipolar II disorder: Secondary | ICD-10-CM | POA: Diagnosis not present

## 2018-11-13 NOTE — Progress Notes (Signed)
ECT consult: Patient had an ECT consult scheduled as a telephone appointment with me at 1:00.  I called the patient at about 1:10 and the call went to voicemail.  Left a message identifying myself.  Called back about a minute later and again went to voicemail.  Left a message with the office phone number again identifying myself.  I will continue to try and reach her during the scheduled time.  Returned call twice more most recently at 138.  At this time left a message asking her to call back to the office number at [phone] to reschedule appointment if she desires.

## 2018-11-22 ENCOUNTER — Telehealth: Payer: Self-pay | Admitting: Neurology

## 2018-11-22 NOTE — Telephone Encounter (Signed)
Pt has called back and scheduled for 07-29 this is FYI

## 2018-11-22 NOTE — Telephone Encounter (Signed)
Called and left patient a message asking her to call back to office and schedule a follow up with Dr. Krista Blue at the end of July . Thanks Hinton Dyer

## 2018-11-22 NOTE — Telephone Encounter (Signed)
-----   Message from Marcial Pacas, MD sent at 09/27/2018  3:21 PM EDT ----- With Krista Blue in 3 months

## 2018-11-24 ENCOUNTER — Encounter (HOSPITAL_COMMUNITY): Payer: Self-pay | Admitting: Emergency Medicine

## 2018-11-24 ENCOUNTER — Other Ambulatory Visit: Payer: Self-pay

## 2018-11-24 ENCOUNTER — Emergency Department (HOSPITAL_COMMUNITY)
Admission: EM | Admit: 2018-11-24 | Discharge: 2018-11-25 | Disposition: A | Discharge status: Home / Self Care | Payer: BC Managed Care – PPO | Attending: Emergency Medicine | Admitting: Emergency Medicine

## 2018-11-24 DIAGNOSIS — E1065 Type 1 diabetes mellitus with hyperglycemia: Secondary | ICD-10-CM | POA: Insufficient documentation

## 2018-11-24 DIAGNOSIS — Z794 Long term (current) use of insulin: Secondary | ICD-10-CM | POA: Insufficient documentation

## 2018-11-24 DIAGNOSIS — Z87891 Personal history of nicotine dependence: Secondary | ICD-10-CM | POA: Insufficient documentation

## 2018-11-24 DIAGNOSIS — R739 Hyperglycemia, unspecified: Secondary | ICD-10-CM

## 2018-11-24 DIAGNOSIS — Z9641 Presence of insulin pump (external) (internal): Secondary | ICD-10-CM | POA: Diagnosis not present

## 2018-11-24 DIAGNOSIS — R809 Proteinuria, unspecified: Secondary | ICD-10-CM | POA: Insufficient documentation

## 2018-11-24 DIAGNOSIS — I10 Essential (primary) hypertension: Secondary | ICD-10-CM | POA: Insufficient documentation

## 2018-11-24 LAB — COMPREHENSIVE METABOLIC PANEL
ALT: 16 U/L (ref 0–44)
AST: 13 U/L — ABNORMAL LOW (ref 15–41)
Albumin: 3.3 g/dL — ABNORMAL LOW (ref 3.5–5.0)
Alkaline Phosphatase: 109 U/L (ref 38–126)
Anion gap: 13 (ref 5–15)
BUN: 31 mg/dL — ABNORMAL HIGH (ref 6–20)
CO2: 24 mmol/L (ref 22–32)
Calcium: 9.1 mg/dL (ref 8.9–10.3)
Chloride: 95 mmol/L — ABNORMAL LOW (ref 98–111)
Creatinine, Ser: 2.33 mg/dL — ABNORMAL HIGH (ref 0.44–1.00)
GFR calc Af Amer: 28 mL/min — ABNORMAL LOW (ref >60)
GFR calc non Af Amer: 24 mL/min — ABNORMAL LOW (ref >60)
Glucose, Bld: 442 mg/dL — ABNORMAL HIGH (ref 70–99)
Potassium: 4.2 mmol/L (ref 3.5–5.1)
Sodium: 132 mmol/L — ABNORMAL LOW (ref 135–145)
Total Bilirubin: 0.6 mg/dL (ref 0.3–1.2)
Total Protein: 6.3 g/dL — ABNORMAL LOW (ref 6.5–8.1)

## 2018-11-24 LAB — CBC WITH DIFFERENTIAL/PLATELET
Abs Immature Granulocytes: 0.01 10*3/uL (ref 0.00–0.07)
Basophils Absolute: 0.1 10*3/uL (ref 0.0–0.1)
Basophils Relative: 1 %
Eosinophils Absolute: 0.2 10*3/uL (ref 0.0–0.5)
Eosinophils Relative: 3 %
HCT: 39.1 % (ref 36.0–46.0)
Hemoglobin: 12.4 g/dL (ref 12.0–15.0)
Immature Granulocytes: 0 %
Lymphocytes Relative: 31 %
Lymphs Abs: 2.5 10*3/uL (ref 0.7–4.0)
MCH: 28.4 pg (ref 26.0–34.0)
MCHC: 31.7 g/dL (ref 30.0–36.0)
MCV: 89.5 fL (ref 80.0–100.0)
Monocytes Absolute: 0.6 10*3/uL (ref 0.1–1.0)
Monocytes Relative: 8 %
Neutro Abs: 4.6 10*3/uL (ref 1.7–7.7)
Neutrophils Relative %: 57 %
Platelets: 255 10*3/uL (ref 150–400)
RBC: 4.37 MIL/uL (ref 3.87–5.11)
RDW: 14.2 % (ref 11.5–15.5)
WBC: 7.9 10*3/uL (ref 4.0–10.5)
nRBC: 0 % (ref 0.0–0.2)

## 2018-11-24 LAB — CBG MONITORING, ED: Glucose-Capillary: 405 mg/dL — ABNORMAL HIGH (ref 70–99)

## 2018-11-24 LAB — TROPONIN I: Troponin I: <0.03 ng/mL (ref <0.03)

## 2018-11-24 MED ORDER — INSULIN ASPART 100 UNIT/ML IV SOLN: 10.0000 [IU] | Freq: Once | INTRAVENOUS | Status: DC

## 2018-11-24 MED ORDER — SODIUM CHLORIDE 0.9 % IV BOLUS
1000.0000 mL | Freq: Once | INTRAVENOUS | Status: AC
  Administered 2018-11-24: 1000 mL via INTRAVENOUS

## 2018-11-24 MED ORDER — POTASSIUM CHLORIDE 10 MEQ/100ML IV SOLN
10.0000 meq | Freq: Once | INTRAVENOUS | Status: AC
  Administered 2018-11-25: 10 meq via INTRAVENOUS
  Filled 2018-11-24: qty 100

## 2018-11-24 NOTE — ED Triage Notes (Signed)
Pt c/o hyperglycemia all day with inability to bring it down with her insulin pump. CBG 405 in triage

## 2018-11-24 NOTE — ED Provider Notes (Signed)
[facility] EMERGENCY DEPARTMENT Provider Note   CSN: [id number] Arrival date & time: 11/24/18  2219    History   Chief Complaint Chief Complaint  Patient presents with   Hyperglycemia    HPI Jasmine [name] is a 48 y.o. female.     Pt is a 48 y/o female - with DM, has an insulin pump - states it stopped working today when it got dislodged.  She has noted that she tried to replace it but despite this she has been consistently hyperglycemic, she even had a small amount of ketones in her urine at home.  She knows that her pump is working right, she primed it right, she has been on the insulin pump for a long time and is been about diabetic for 20 years following with an endocrinologist closely.  She does have a slight heaviness on her chest but this did not happen until her blood sugar started going up.  She is not short of breath, febrile, she is not vomiting or having diarrhea but does feel nauseated.  Nothing seems to make this better or worse  The history is provided by the patient.  Hyperglycemia   Past Medical History:  Diagnosis Date   Anxiety    Balance problems    Bipolar disorder ([place])    Chronic fatigue    CKD (chronic kidney disease), stage II    Depression    Diabetes mellitus    DKA, type 1 ([place]) 11/04/2011   Elevated cholesterol    Fibromyalgia    GERD (gastroesophageal reflux disease)    Headache    History of suicidal ideation    Hyperlipemia    Hypertension    Hypothyroidism    IBS (irritable bowel syndrome)    Obesity    Sleep apnea    HAS C -PAP / DOES NOT USE   Stress incontinence    Pt had surgery to correct this.   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/10/2018   CKD (chronic kidney disease), stage IV ([place]) 07/01/2018   Transient Hypoglycemia 07/01/2018   Chronic migraine 01/17/2017   Diabetic peripheral neuropathy ([place]) 01/17/2017   OSA (obstructive sleep apnea) 07/27/2016   Wheezing 07/27/2016   Hyperglycemia 12/25/2012   Acute on chronic renal failure ([place]) 12/25/2012   Pulmonary infiltrates 10/02/2012   Postnasal drip 10/02/2012   Hyperkalemia 09/25/2012   Morbid obesity ([place]) 09/24/2012   HTN (hypertension) 09/24/2012   Bipolar II disorder ([place]) 09/24/2012   URI (upper respiratory infection) 09/24/2012   DKA, type 1 ([place]) 11/04/2011   Gastroenteritis 11/03/2011   DM (diabetes mellitus), type 1, uncontrolled ([place]) 11/03/2011   Hyponatremia 11/03/2011   Elevated lipase 11/03/2011   Hypothyroidism 11/03/2011   Tobacco abuse 11/03/2011   SUI (stress urinary incontinence, female) 08/16/2011    Past Surgical History:  Procedure Laterality Date   INCONTINENCE SURGERY     NASAL FRACTURE SURGERY     ovary removed     OVARY SURGERY     PUBOVAGINAL SLING  08/16/2011   Procedure: [name];  Surgeon: Bernestine Amass, MD;  Location: [facility];  Service: Urology;  Laterality: N/A;          UTERINE FIBROID SURGERY  2001     OB History    Gravida  1   Para      Term      Preterm      AB      Living        SAB      TAB      Ectopic      Multiple      Live Births               Home Medications    Prior to Admission medications   Medication Sig Start Date End Date Taking? Authorizing Provider  aspirin EC 81 MG tablet Take 81 mg by mouth at bedtime.     [provider]  buPROPion (WELLBUTRIN XL) 300 MG 24 hr tablet Take 1 tablet (300 mg total) by mouth daily. 10/31/18   Norman Clay, MD  carvedilol (COREG) 25 MG tablet Take 25 mg by mouth 2 (two)  times daily with a meal.    [provider]  Cholecalciferol (VITAMIN D3 SUPER STRENGTH) 50 MCG (2000 UT) TABS Take 1 tablet by mouth daily.  07/26/18   [provider]  dicyclomine (BENTYL) 10 MG capsule Take 10 mg by mouth 3 (three) times daily as needed for spasms.    [provider]  DULoxetine (CYMBALTA) 60 MG capsule Take 1 capsule (60 mg total) by mouth at bedtime. 10/31/18   Norman Clay, MD  fluticasone (FLONASE) 50 MCG/ACT nasal spray Place 2 sprays into the nose daily as needed for allergies.     [provider]  folic acid (FOLVITE) 449 MCG tablet Take 400 mcg by mouth every evening.     [provider]  furosemide (LASIX) 20 MG tablet Take 20 mg by mouth daily.     [provider]  gabapentin (NEURONTIN) 300 MG capsule Take 1 capsule (300 mg total) by mouth at bedtime. 09/03/18   Derrill Center, NP  insulin glargine (LANTUS) 100 UNIT/ML injection Inject 0.5 mLs (50 Units total) into the skin daily. 09/04/18   Derrill Center, NP  lamoTRIgine (LAMICTAL) 200 MG tablet Take 1 tablet (200 mg total) by mouth daily. 10/31/18   Norman Clay, MD  levothyroxine (SYNTHROID, LEVOTHROID) 150 MCG tablet Take 150 mcg by mouth daily before breakfast.    [provider]  LORazepam (ATIVAN) 0.5 MG tablet Take 1 tablet (0.5 mg total) by mouth 2 (two) times daily as needed for anxiety. 10/31/18   Norman Clay, MD  lubiprostone (AMITIZA) 8 MCG capsule Take 8 mcg by mouth as needed for constipation.     [provider]  NOVOLOG 100 UNIT/ML injection Inject 2.425-2.95 Units into the skin continuous. Via pump 7pm to midnight 2.950 8:30am -7pm2.925 Midnight - 8:30 2.425 02/24/17   [provider]  Omega-3 Fatty Acids (FISH OIL PO) Take 1 capsule by mouth daily.    [provider]  omeprazole (PRILOSEC) 20 MG capsule Take 20 mg by mouth at bedtime.     [provider]  ondansetron (ZOFRAN ODT) 4 MG disintegrating  tablet Take 1 tablet (4 mg total) by mouth every 8 (eight) hours as needed. Patient taking differently: Take 4 mg by mouth every 8 (eight) hours as needed for nausea or vomiting.  01/17/17   Marcial Pacas, MD  pravastatin (PRAVACHOL) 80 MG tablet Take 80 mg by mouth at bedtime.  [provider]  PROAIR HFA 108 [phone] Base) MCG/ACT inhaler Inhale 1-2 puffs into the lungs every 6 (six) hours as needed for wheezing or shortness of breath.  12/30/16   [provider]  QUEtiapine (SEROQUEL) 25 MG tablet Take 0.5 tablets (12.5 mg total) by mouth at bedtime. 10/02/18   Norman Clay, MD  rizatriptan (MAXALT-MLT) 10 MG disintegrating tablet Take 1 tablet (10 mg total) as needed by mouth. May repeat in 2 hours if needed Patient taking differently: Take 10 mg by mouth as needed for migraine. May repeat in 2 hours if needed 04/20/17   Marcial Pacas, MD  traMADol (ULTRAM) 50 MG tablet Take 50 mg by mouth 2 (two) times daily.  07/14/18   [provider]    Family History Family History  Problem Relation Age of Onset   Asthma Mother    Bipolar disorder Mother    Heart disease Father    Lymphoma Father    Hypertension Father    Thyroid disease Father    Hyperlipidemia Father    Diabetes Father    Cancer Paternal Grandmother        lung and breast   Bladder Cancer Paternal Grandfather    Suicidality Maternal Grandfather    Thyroid disease Brother     Social History Social History   Tobacco Use   Smoking status: Former Smoker    Packs/day: 0.75    Years: 20.00    Pack years: 15.00    Types: Cigarettes    Quit date: 06/07/2012    Years since quitting: 6.4   Smokeless tobacco: Never Used  Substance Use Topics   Alcohol use: No   Drug use: No     Allergies   Ciprofloxacin, Levaquin [levofloxacin], Buspar [buspirone], Linaclotide, Advair diskus [fluticasone-salmeterol], Biaxin [clarithromycin], and Hydroxyzine   Review of Systems Review of Systems  All  other systems reviewed and are negative.   Physical Exam Updated Vital Signs BP (!) 131/51    Pulse 69    Temp 98.2 F (36.8 C)    Resp 18    Ht 1.753 m (5\' 9" )    Wt (!) 136.6 kg    SpO2 97%    BMI 44.46 kg/m   Physical Exam Vitals signs and nursing note reviewed.  Constitutional:      General: She is not in acute distress.    Appearance: She is well-developed.  HENT:     Head: Normocephalic and atraumatic.     Mouth/Throat:     Pharynx: No oropharyngeal exudate.  Eyes:     General: No scleral icterus.       Right eye: No discharge.        Left eye: No discharge.     Conjunctiva/sclera: Conjunctivae normal.     Pupils: Pupils are equal, round, and reactive to light.  Neck:     Musculoskeletal: Normal range of motion and neck supple.     Thyroid: No thyromegaly.     Vascular: No JVD.  Cardiovascular:     Rate and Rhythm: Normal rate and regular rhythm.     Heart sounds: Normal heart sounds. No murmur. No friction rub. No gallop.   Pulmonary:     Effort: Pulmonary effort is normal. No respiratory distress.     Breath sounds: Normal breath sounds. No wheezing or rales.  Abdominal:     General: Bowel sounds are normal. There is no distension.     Palpations: Abdomen is soft. There is no mass.  Tenderness: There is no abdominal tenderness.  Musculoskeletal: Normal range of motion.        General: No tenderness.  Lymphadenopathy:     Cervical: No cervical adenopathy.  Skin:    General: Skin is warm and dry.     Findings: No erythema or rash.  Neurological:     Mental Status: She is alert.     Coordination: Coordination normal.  Psychiatric:        Behavior: Behavior normal.      ED Treatments / Results  Labs (all labs ordered are listed, but only abnormal results are displayed) Labs Reviewed  COMPREHENSIVE METABOLIC PANEL - Abnormal; Notable for the following components:      Result Value   Sodium 132 (*)    Chloride 95 (*)    Glucose, Bld 442 (*)    BUN  31 (*)    Creatinine, Ser 2.33 (*)    Total Protein 6.3 (*)    Albumin 3.3 (*)    AST 13 (*)    GFR calc non Af Amer 24 (*)    GFR calc Af Amer 28 (*)    All other components within normal limits  URINALYSIS, ROUTINE W REFLEX MICROSCOPIC - Abnormal; Notable for the following components:   Color, Urine STRAW (*)    Glucose, UA >=500 (*)    Protein, ur 30 (*)    Leukocytes,Ua TRACE (*)    Bacteria, UA RARE (*)    All other components within normal limits  CBG MONITORING, ED - Abnormal; Notable for the following components:   Glucose-Capillary 405 (*)    All other components within normal limits  CBG MONITORING, ED - Abnormal; Notable for the following components:   Glucose-Capillary 315 (*)    All other components within normal limits  CBG MONITORING, ED - Abnormal; Notable for the following components:   Glucose-Capillary 125 (*)    All other components within normal limits  CBC WITH DIFFERENTIAL/PLATELET  TROPONIN I  CBG MONITORING, ED    EKG EKG Interpretation  Date/Time:  Friday November 24 2018 23:00:36 EDT Ventricular Rate:  69 PR Interval:    QRS Duration: 101 QT Interval:  414 QTC Calculation: 444 R Axis:   15 Text Interpretation:  Sinus rhythm Low voltage, precordial leads Probable anteroseptal infarct, old ECG OTHERWISE WITHIN NORMAL LIMITS Confirmed by Noemi Chapel [phone]) on 11/24/2018 11:54:25 PM   Radiology No results found.  Procedures Procedures (including critical care time)  Medications Ordered in ED Medications  sodium chloride 0.9 % bolus 1,000 mL (0 mLs Intravenous Stopped 11/25/18 0002)  potassium chloride 10 mEq in 100 mL IVPB (0 mEq Intravenous Stopped 11/25/18 0105)  insulin aspart (novoLOG) injection 5 Units (5 Units Intravenous Given 11/25/18 0114)     Initial Impression / Assessment and Plan / ED Course  I have reviewed the triage vital signs and the nursing notes.  Pertinent labs & imaging results that were available during my care of the  patient were reviewed by me and considered in my medical decision making (see chart for details).  Clinical Course as of Nov 25 302  Fri Nov 24, 2018  2353 Compared with prior labs today the patient has a slight hyponatremia, relative hypokalemia, mild acute kidney injury and no signs of DKA with no anion gap acidosis and a normal carbon dioxide level.  She will get insulin, fluids, potassium and a recheck.  Troponin is negative, EKG is unremarkable, urinalysis pending.   [BM]  Sat Nov 25, 2018  0036 UA neg for ketones, protein and glucose present not surprisingly, no sig WBC or bacteria - doubt UTI.   [BM]  0150 Blood sugar improved significantly with fluids and just after insulin was given, sugar was found to be 125, pt given PO crackers and soda.  Normal MS, no abnormal symptoms at this time.   [BM]    Clinical Course User Index [BM] Noemi Chapel, MD      The patient is well-appearing, vital signs are normal, she is hyperglycemic but does not appear to be septic or toxic or shocky.  At this time will check labs to rule out DKA, she will get IV fluids, will get some insulin once we see labs, patient agreeable.  Need to make sure that the insulin pump is currently working functionally as well  The patient has tolerated oral food and fluids, blood sugar is maintained in a normal range, she ate a sandwich prior to discharge.  She was only given 10 mEq of potassium and 5 units of insulin and did very well.  She has a normal mental status, normal level of alertness and is agreeable to check her sugar every 3 hours for the next 24 hours.  She was also informed that she had protein in her urine and that she needed to follow-up with her family doctor about this.  Final Clinical Impressions(s) / ED Diagnoses   Final diagnoses:  Hyperglycemia  Proteinuria, unspecified type    ED Discharge Orders    None       Noemi Chapel, MD 11/25/18 [phone]

## 2018-11-24 NOTE — ED Notes (Signed)
Informed pt that we need urine sample. Call bell at bedside when pt ready.

## 2018-11-25 LAB — URINALYSIS, ROUTINE W REFLEX MICROSCOPIC
Bilirubin Urine: NEGATIVE
Glucose, UA: >=500 mg/dL — AB
Hgb urine dipstick: NEGATIVE
Ketones, ur: NEGATIVE mg/dL
Nitrite: NEGATIVE
Protein, ur: 30 mg/dL — AB
Specific Gravity, Urine: 1.006 (ref 1.005–1.030)
pH: 7 (ref 5.0–8.0)

## 2018-11-25 LAB — CBG MONITORING, ED
Glucose-Capillary: 125 mg/dL — ABNORMAL HIGH (ref 70–99)
Glucose-Capillary: 315 mg/dL — ABNORMAL HIGH (ref 70–99)
Glucose-Capillary: 80 mg/dL (ref 70–99)
Glucose-Capillary: 81 mg/dL (ref 70–99)

## 2018-11-25 MED ORDER — INSULIN ASPART 100 UNIT/ML IV SOLN
5.0000 [IU] | Freq: Once | INTRAVENOUS | Status: AC
  Administered 2018-11-25: 01:00:00 5 [IU] via INTRAVENOUS

## 2018-11-25 MED ORDER — INSULIN ASPART 100 UNIT/ML ~~LOC~~ SOLN
SUBCUTANEOUS | Status: AC
  Administered 2018-11-25: 5 [IU] via INTRAVENOUS
  Filled 2018-11-25: qty 1

## 2018-11-25 NOTE — Discharge Instructions (Signed)
Please ensure that you are taking your blood sugar about every 3 hours for the next 24 hours to make sure that it does not drop.  You will need to have your endocrinologist recheck your insulin pump to make sure that everything is correlating with your blood sugars.  If you should develop severe high blood sugar or low blood sugar or if you have any other worsening conditions or questions you should return to the emergency department.

## 2018-11-25 NOTE — ED Notes (Signed)
Pt given another soda and sandwich to eat prior to discharge.

## 2018-11-25 NOTE — ED Notes (Signed)
Pt given soda, graham crackers, and peanut butter to eat.
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
## 2018-12-06 ENCOUNTER — Encounter (HOSPITAL_COMMUNITY): Payer: Self-pay | Admitting: Psychiatry

## 2018-12-06 ENCOUNTER — Ambulatory Visit (INDEPENDENT_AMBULATORY_CARE_PROVIDER_SITE_OTHER): Payer: BC Managed Care – PPO | Admitting: Psychiatry

## 2018-12-06 ENCOUNTER — Other Ambulatory Visit: Payer: Self-pay

## 2018-12-06 DIAGNOSIS — F3181 Bipolar II disorder: Secondary | ICD-10-CM | POA: Diagnosis not present

## 2018-12-06 MED ORDER — BUPROPION HCL ER (XL) 300 MG PO TB24: 300.0000 mg | ORAL_TABLET | Freq: Every day | ORAL | 1 refills | Status: DC

## 2018-12-06 MED ORDER — LAMOTRIGINE 200 MG PO TABS: 200.0000 mg | ORAL_TABLET | Freq: Every day | ORAL | 1 refills | Status: DC

## 2018-12-06 MED ORDER — DULOXETINE HCL 60 MG PO CPEP: 60.0000 mg | ORAL_CAPSULE | Freq: Every day | ORAL | 1 refills | Status: DC

## 2018-12-06 MED ORDER — LORAZEPAM 0.5 MG PO TABS: 0.5000 mg | ORAL_TABLET | Freq: Two times a day (BID) | ORAL | 1 refills | Status: DC | PRN

## 2018-12-06 NOTE — Patient Instructions (Signed)
1. Continue lamotrigine 200 mg daily  2.Continue duloxetine 60 mgdaily (limited benefit from higher dose) 3.Continuebupropion300 mg daily (maximum dose given her renal function) 4. Continue lorazepam 0.5 mg twice a day as needed for anxiety 7. Provided number for [facility] [phone]/[place] [facility] ECT [id number] [phone]  Next appointment: 8/20 at 1:30

## 2018-12-07 ENCOUNTER — Telehealth (HOSPITAL_COMMUNITY): Payer: Self-pay | Admitting: Professional

## 2018-12-14 ENCOUNTER — Encounter (HOSPITAL_COMMUNITY): Payer: Self-pay

## 2018-12-14 ENCOUNTER — Other Ambulatory Visit: Payer: Self-pay

## 2018-12-14 ENCOUNTER — Emergency Department (HOSPITAL_COMMUNITY)
Admission: EM | Admit: 2018-12-14 | Discharge: 2018-12-14 | Disposition: A | Discharge status: Home / Self Care | Payer: BC Managed Care – PPO | Attending: Emergency Medicine | Admitting: Emergency Medicine

## 2018-12-14 DIAGNOSIS — E1122 Type 2 diabetes mellitus with diabetic chronic kidney disease: Secondary | ICD-10-CM | POA: Insufficient documentation

## 2018-12-14 DIAGNOSIS — Z79899 Other long term (current) drug therapy: Secondary | ICD-10-CM | POA: Diagnosis not present

## 2018-12-14 DIAGNOSIS — Z7982 Long term (current) use of aspirin: Secondary | ICD-10-CM | POA: Diagnosis not present

## 2018-12-14 DIAGNOSIS — Z794 Long term (current) use of insulin: Secondary | ICD-10-CM | POA: Insufficient documentation

## 2018-12-14 DIAGNOSIS — E039 Hypothyroidism, unspecified: Secondary | ICD-10-CM | POA: Diagnosis not present

## 2018-12-14 DIAGNOSIS — R300 Dysuria: Secondary | ICD-10-CM | POA: Diagnosis present

## 2018-12-14 DIAGNOSIS — N390 Urinary tract infection, site not specified: Secondary | ICD-10-CM | POA: Diagnosis not present

## 2018-12-14 DIAGNOSIS — Z87891 Personal history of nicotine dependence: Secondary | ICD-10-CM | POA: Diagnosis not present

## 2018-12-14 DIAGNOSIS — N182 Chronic kidney disease, stage 2 (mild): Secondary | ICD-10-CM | POA: Diagnosis not present

## 2018-12-14 DIAGNOSIS — I129 Hypertensive chronic kidney disease with stage 1 through stage 4 chronic kidney disease, or unspecified chronic kidney disease: Secondary | ICD-10-CM | POA: Insufficient documentation

## 2018-12-14 DIAGNOSIS — E1140 Type 2 diabetes mellitus with diabetic neuropathy, unspecified: Secondary | ICD-10-CM | POA: Insufficient documentation

## 2018-12-14 LAB — URINALYSIS, ROUTINE W REFLEX MICROSCOPIC
Bilirubin Urine: NEGATIVE
Glucose, UA: NEGATIVE mg/dL
Ketones, ur: NEGATIVE mg/dL
Nitrite: NEGATIVE
Protein, ur: 100 mg/dL — AB
Specific Gravity, Urine: 1.004 — ABNORMAL LOW (ref 1.005–1.030)
pH: 6 (ref 5.0–8.0)

## 2018-12-14 LAB — POC URINE PREG, ED: Preg Test, Ur: NEGATIVE

## 2018-12-14 MED ORDER — CEFTRIAXONE SODIUM 1 G IJ SOLR
1.0000 g | Freq: Once | INTRAMUSCULAR | Status: AC
  Administered 2018-12-14: 1 g via INTRAMUSCULAR
  Filled 2018-12-14: qty 10

## 2018-12-14 MED ORDER — CEPHALEXIN 500 MG PO CAPS: 500.0000 mg | ORAL_CAPSULE | Freq: Two times a day (BID) | ORAL | 0 refills | Status: DC

## 2018-12-14 NOTE — ED Triage Notes (Signed)
Pt reports dysuria, urgency, and frequency that started 2 days ago, pt said, "feels like a UTI, but hasn't been able to see PCP". Today pt started having lower back pain and hematuria.

## 2018-12-14 NOTE — ED Provider Notes (Signed)
[facility] EMERGENCY DEPARTMENT Provider Note   CSN: [id number] Arrival date & time: 12/14/18  0008    History   Chief Complaint Chief Complaint  Patient presents with  . Dysuria  . Hematuria    HPI Jasmine [name] is a 48 y.o. female.     Patient presents to the emergency department for evaluation of urinary symptoms.  Patient reports that she started to notice urinary frequency and burning with urination 2 days ago.  Today she started to notice blood in her urine and has developed low back pain.  She has not had any fever, nausea or vomiting.  She reports that this feels like when she has had a urinary tract infection in the past.     Past Medical History:  Diagnosis Date  . Anxiety   . Balance problems   . Bipolar disorder ([place])   . Chronic fatigue   . CKD (chronic kidney disease), stage II   . Depression   . Diabetes mellitus   . DKA, type 1 ([place]) 11/04/2011  . Elevated cholesterol   . Fibromyalgia   . GERD (gastroesophageal reflux disease)   . Headache   . History of suicidal ideation   . Hyperlipemia   . Hypertension   . Hypothyroidism   . IBS (irritable bowel syndrome)   . Obesity   . Sleep apnea    HAS C -PAP / DOES NOT USE  . Stress incontinence    Pt had surgery to correct this.  . Tachycardia   . Tobacco abuse   . Tremor   . UTI (lower urinary tract infection)     Patient Active Problem List   Diagnosis Date Noted  . Dizziness 09/27/2018  . Polypharmacy 09/27/2018  . Severe recurrent major depression without psychotic features ([place]) 08/30/2018  . Bipolar 2 disorder, major depressive episode ([place]) 07/10/2018  . MDD (major depressive disorder), recurrent, severe, with psychosis ([place]) 07/10/2018  . CKD (chronic kidney disease), stage IV ([place]) 07/01/2018  . Transient Hypoglycemia 07/01/2018  . Chronic migraine 01/17/2017  . Diabetic peripheral neuropathy ([place]) 01/17/2017  . OSA (obstructive sleep apnea) 07/27/2016  . Wheezing 07/27/2016  .  Hyperglycemia 12/25/2012  . Acute on chronic renal failure ([place]) 12/25/2012  . Pulmonary infiltrates 10/02/2012  . Postnasal drip 10/02/2012  . Hyperkalemia 09/25/2012  . Morbid obesity ([place]) 09/24/2012  . HTN (hypertension) 09/24/2012  . Bipolar II disorder ([place]) 09/24/2012  . URI (upper respiratory infection) 09/24/2012  . DKA, type 1 ([place]) 11/04/2011  . Gastroenteritis 11/03/2011  . DM (diabetes mellitus), type 1, uncontrolled ([place]) 11/03/2011  . Hyponatremia 11/03/2011  . Elevated lipase 11/03/2011  . Hypothyroidism 11/03/2011  . Tobacco abuse 11/03/2011  . SUI (stress urinary incontinence, female) 08/16/2011    Past Surgical History:  Procedure Laterality Date  . INCONTINENCE SURGERY    . NASAL FRACTURE SURGERY    . ovary removed    . OVARY SURGERY    . PUBOVAGINAL SLING  08/16/2011   Procedure: [name];  Surgeon: Bernestine Amass, MD;  Location: [facility]S;  Service: Urology;  Laterality: N/A;         . UTERINE FIBROID SURGERY  2001     OB History    Gravida  1   Para      Term      Preterm      AB      Living        SAB      TAB  Ectopic      Multiple      Live Births               Home Medications    Prior to Admission medications   Medication Sig Start Date End Date Taking? Authorizing Provider  aspirin EC 81 MG tablet Take 81 mg by mouth at bedtime.     [provider]  buPROPion (WELLBUTRIN XL) 300 MG 24 hr tablet Take 1 tablet (300 mg total) by mouth daily. 12/06/18   Norman Clay, MD  carvedilol (COREG) 25 MG tablet Take 25 mg by mouth 2 (two) times daily with a meal.    [provider]  Cholecalciferol (VITAMIN D3 SUPER STRENGTH) 50 MCG (2000 UT) TABS Take 1 tablet by mouth daily.  07/26/18   [provider]  dicyclomine (BENTYL) 10 MG capsule Take 10 mg by mouth 3 (three) times daily as needed for spasms.    [provider]  DULoxetine (CYMBALTA) 60 MG capsule Take 1 capsule (60 mg  total) by mouth at bedtime. 12/06/18   Norman Clay, MD  fluticasone (FLONASE) 50 MCG/ACT nasal spray Place 2 sprays into the nose daily as needed for allergies.     [provider]  folic acid (FOLVITE) 909 MCG tablet Take 400 mcg by mouth every evening.     [provider]  furosemide (LASIX) 20 MG tablet Take 20 mg by mouth daily.     [provider]  insulin glargine (LANTUS) 100 UNIT/ML injection Inject 0.5 mLs (50 Units total) into the skin daily. 09/04/18   Derrill Center, NP  lamoTRIgine (LAMICTAL) 200 MG tablet Take 1 tablet (200 mg total) by mouth daily. 12/06/18   Norman Clay, MD  levothyroxine (SYNTHROID, LEVOTHROID) 150 MCG tablet Take 150 mcg by mouth daily before breakfast.    [provider]  LORazepam (ATIVAN) 0.5 MG tablet Take 1 tablet (0.5 mg total) by mouth 2 (two) times daily as needed for anxiety. 12/06/18   Norman Clay, MD  lubiprostone (AMITIZA) 8 MCG capsule Take 8 mcg by mouth as needed for constipation.     [provider]  NOVOLOG 100 UNIT/ML injection Inject 2.425-2.95 Units into the skin continuous. Via pump 7pm to midnight 2.950 8:30am -7pm2.925 Midnight - 8:30 2.425 02/24/17   [provider]  Omega-3 Fatty Acids (FISH OIL PO) Take 1 capsule by mouth daily.    [provider]  omeprazole (PRILOSEC) 20 MG capsule Take 20 mg by mouth at bedtime.     [provider]  ondansetron (ZOFRAN ODT) 4 MG disintegrating tablet Take 1 tablet (4 mg total) by mouth every 8 (eight) hours as needed. Patient taking differently: Take 4 mg by mouth every 8 (eight) hours as needed for nausea or vomiting.  01/17/17   Marcial Pacas, MD  pravastatin (PRAVACHOL) 80 MG tablet Take 80 mg by mouth at bedtime.    [provider]  PROAIR HFA 108 [phone] Base) MCG/ACT inhaler Inhale 1-2 puffs into the lungs every 6 (six) hours as needed for wheezing or shortness of breath.  12/30/16   [provider]  rizatriptan  (MAXALT-MLT) 10 MG disintegrating tablet Take 1 tablet (10 mg total) as needed by mouth. May repeat in 2 hours if needed Patient taking differently: Take 10 mg by mouth as needed for migraine. May repeat in 2 hours if needed 04/20/17   Marcial Pacas, MD  traMADol (ULTRAM) 50 MG tablet Take 50 mg by mouth 2 (two)  times daily.  07/14/18   [provider]    Family History Family History  Problem Relation Age of Onset  . Asthma Mother   . Bipolar disorder Mother   . Heart disease Father   . Lymphoma Father   . Hypertension Father   . Thyroid disease Father   . Hyperlipidemia Father   . Diabetes Father   . Cancer Paternal Grandmother        lung and breast  . Bladder Cancer Paternal Grandfather   . Suicidality Maternal Grandfather   . Thyroid disease Brother     Social History Social History   Tobacco Use  . Smoking status: Former Smoker    Packs/day: 0.75    Years: 20.00    Pack years: 15.00    Types: Cigarettes    Quit date: 06/07/2012    Years since quitting: 6.5  . Smokeless tobacco: Never Used  Substance Use Topics  . Alcohol use: No  . Drug use: No     Allergies   Ciprofloxacin, Levaquin [levofloxacin], Buspar [buspirone], Linaclotide, Advair diskus [fluticasone-salmeterol], Biaxin [clarithromycin], and Hydroxyzine   Review of Systems Review of Systems  Genitourinary: Positive for dysuria, frequency and hematuria.  Musculoskeletal: Positive for back pain.  All other systems reviewed and are negative.    Physical Exam Updated Vital Signs Pulse 70   Temp 98.2 F (36.8 C) (Oral)   Resp 16   Ht 5\' 9"  (1.753 m)   Wt 135.2 kg   SpO2 98%   BMI 44.01 kg/m   Physical Exam Vitals signs and nursing note reviewed.  Constitutional:      General: She is not in acute distress.    Appearance: Normal appearance. She is well-developed.  HENT:     Head: Normocephalic and atraumatic.     Right Ear: Hearing normal.     Left Ear: Hearing normal.     Nose: Nose  normal.  Eyes:     Conjunctiva/sclera: Conjunctivae normal.     Pupils: Pupils are equal, round, and reactive to light.  Neck:     Musculoskeletal: Normal range of motion and neck supple.  Cardiovascular:     Rate and Rhythm: Regular rhythm.     Heart sounds: S1 normal and S2 normal. No murmur. No friction rub. No gallop.   Pulmonary:     Effort: Pulmonary effort is normal. No respiratory distress.     Breath sounds: Normal breath sounds.  Chest:     Chest wall: No tenderness.  Abdominal:     General: Bowel sounds are normal.     Palpations: Abdomen is soft.     Tenderness: There is no abdominal tenderness. There is no right CVA tenderness, left CVA tenderness, guarding or rebound. Negative signs include Murphy's sign and McBurney's sign.     Hernia: No hernia is present.  Musculoskeletal: Normal range of motion.  Skin:    General: Skin is warm and dry.     Findings: No rash.  Neurological:     Mental Status: She is alert and oriented to person, place, and time.     GCS: GCS eye subscore is 4. GCS verbal subscore is 5. GCS motor subscore is 6.     Cranial Nerves: No cranial nerve deficit.     Sensory: No sensory deficit.     Coordination: Coordination normal.  Psychiatric:        Speech: Speech normal.        Behavior: Behavior normal.  Thought Content: Thought content normal.      ED Treatments / Results  Labs (all labs ordered are listed, but only abnormal results are displayed) Labs Reviewed  URINALYSIS, ROUTINE W REFLEX MICROSCOPIC - Abnormal; Notable for the following components:      Result Value   Specific Gravity, Urine 1.004 (*)    Hgb urine dipstick LARGE (*)    Protein, ur 100 (*)    Leukocytes,Ua MODERATE (*)    Bacteria, UA MANY (*)    All other components within normal limits  URINE CULTURE  POC URINE PREG, ED    EKG None  Radiology No results found.  Procedures Procedures (including critical care time)  Medications Ordered in ED  Medications  cefTRIAXone (ROCEPHIN) injection 1 g (has no administration in time range)     Initial Impression / Assessment and Plan / ED Course  I have reviewed the triage vital signs and the nursing notes.  Pertinent labs & imaging results that were available during my care of the patient were reviewed by me and considered in my medical decision making (see chart for details).        Patient presents to the emergency department for evaluation of urinary frequency, urgency, dysuria with hematuria.  She has had urinary tract infections in the past with similar symptoms.  She is complaining of back pain but it is mild and diffuse lower back pain, not unilateral to suggest ureteral colic.  Urinalysis shows signs of infection.  Patient administered Rocephin here in the ER will continue outpatient Keflex.  Final Clinical Impressions(s) / ED Diagnoses   Final diagnoses:  Lower urinary tract infectious disease    ED Discharge Orders    None       Orpah Greek, MD 12/14/18 [phone]

## 2018-12-15 LAB — URINE CULTURE: Culture: >=100000 — AB

## 2018-12-16 ENCOUNTER — Telehealth: Payer: Self-pay | Admitting: Emergency Medicine

## 2018-12-16 NOTE — Telephone Encounter (Signed)
Post ED Visit - Positive Culture Follow-up  Culture report reviewed by antimicrobial stewardship pharmacist: [facility] []  Elenor Quinones, Pharm.D. []  Heide Guile, Pharm.D., BCPS AQ-ID []  Parks Neptune, Pharm.D., BCPS []  Alycia Rossetti, Pharm.D., BCPS []  [place], Pharm.D., BCPS, AAHIVP []  Legrand Como, Pharm.D., BCPS, AAHIVP []  Salome Arnt, PharmD, BCPS []  Johnnette Gourd, PharmD, BCPS [x]  Hughes Better, PharmD, BCPS []  Leeroy Cha, PharmD []  Laqueta Linden, PharmD, BCPS []  Albertina Parr, PharmD  [place] Team []  Leodis Sias, PharmD []  Lindell Spar, PharmD []  Royetta Asal, PharmD []  Graylin Shiver, Rph []  Rema Fendt) Glennon Mac, PharmD []  Arlyn Dunning, PharmD []  Netta Cedars, PharmD []  Dia Sitter, PharmD []  Leone Haven, PharmD []  Gretta Arab, PharmD []  Theodis Shove, PharmD []  Peggyann Juba, PharmD []  Reuel Boom, PharmD   Positive urine culture Treated with Cephalexin, organism sensitive to the same and no further patient follow-up is required at this time.  Jasmine [name] 12/16/2018, 2:43 PM

## 2019-01-02 ENCOUNTER — Telehealth (HOSPITAL_COMMUNITY): Payer: Self-pay

## 2019-01-02 NOTE — Telephone Encounter (Signed)
Patient called very distraught and stated that she does not want to get out of bed and sleeps as much as possible. She also stated that she cries all the time and does not have control over anything including herself. I asked her if she wanted to hurt herself and she stated that she has thought about it but she will not. She stated that she cannot handle it anymore and doesn't know what to do. Please review and advise. Thank you.

## 2019-01-02 NOTE — Telephone Encounter (Signed)
Ask her to have sooner appointment- I am able to see her tomorrow at 8:40 if that works for her.

## 2019-01-03 ENCOUNTER — Telehealth (HOSPITAL_COMMUNITY): Payer: Self-pay | Admitting: Professional

## 2019-01-03 ENCOUNTER — Ambulatory Visit (INDEPENDENT_AMBULATORY_CARE_PROVIDER_SITE_OTHER): Payer: BC Managed Care – PPO | Admitting: Psychiatry

## 2019-01-03 ENCOUNTER — Encounter (HOSPITAL_COMMUNITY): Payer: Self-pay | Admitting: Psychiatry

## 2019-01-03 ENCOUNTER — Ambulatory Visit: Payer: BLUE CROSS/BLUE SHIELD | Admitting: Neurology

## 2019-01-03 ENCOUNTER — Other Ambulatory Visit: Payer: Self-pay

## 2019-01-03 DIAGNOSIS — F3181 Bipolar II disorder: Secondary | ICD-10-CM

## 2019-01-03 NOTE — Patient Instructions (Addendum)
1. Continue lamotrigine 200 mg daily  2.Continue duloxetine 60 mgdaily  3.Continuebupropion300 mg daily 4. Continue lorazepam 0.5 mg twice a day as needed for anxiety 7.Referral for [phone] 8Next appointment: 8/20 at 1:30  CONTACT INFORMATION  What to do if you need to get in touch with someone regarding a psychiatric issue:  1. EMERGENCY: For psychiatric emergencies (if you are suicidal or if there are any other safety issues) call 911 and/or go to your nearest Emergency Room immediately.   2. IF YOU NEED SOMEONE TO TALK TO RIGHT NOW: Given my clinical responsibilities, I may not be able to speak with you over the phone for a prolonged period of time.  a. You may always call The National Suicide Prevention Lifeline at 1-800-273-TALK 831-481-9188).  b. Your county of residence will also have local crisis services. For [facility]: [place] at [phone] ([place])
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
## 2019-01-03 NOTE — Telephone Encounter (Signed)
I had scheduled her an appointment for today (01/03/19) at 8:40am

## 2019-01-08 ENCOUNTER — Telehealth (HOSPITAL_COMMUNITY): Payer: Self-pay | Admitting: Professional

## 2019-01-08 ENCOUNTER — Ambulatory Visit (HOSPITAL_COMMUNITY): Payer: BC Managed Care – PPO | Admitting: Professional

## 2019-01-10 ENCOUNTER — Telehealth (HOSPITAL_COMMUNITY): Payer: Self-pay | Admitting: Psychiatry

## 2019-01-10 NOTE — Telephone Encounter (Signed)
According to [facility], she did not show up to the appointment. Could you contact the patient to have follow up with them- [phone]. Also please remind her to contact for ECT appointment: [phone] (this was discussed at her prior visit.)

## 2019-01-10 NOTE — Telephone Encounter (Signed)
LVM per provider: According to [facility], she did not show up to the appointment. Could you contact the patient to have follow up with them- [phone]. Also please remind her to contact for ECT appointment: [phone] (this was discussed at her prior visit.)

## 2019-01-11 ENCOUNTER — Telehealth (HOSPITAL_COMMUNITY): Payer: Self-pay | Admitting: Professional

## 2019-01-12 ENCOUNTER — Telehealth (HOSPITAL_COMMUNITY): Payer: Self-pay | Admitting: Professional

## 2019-01-12 ENCOUNTER — Telehealth (HOSPITAL_COMMUNITY): Payer: Self-pay | Admitting: Psychiatry

## 2019-01-12 ENCOUNTER — Encounter (HOSPITAL_COMMUNITY): Payer: Self-pay

## 2019-01-12 ENCOUNTER — Other Ambulatory Visit: Payer: Self-pay

## 2019-01-12 ENCOUNTER — Emergency Department (HOSPITAL_COMMUNITY)
Admission: EM | Admit: 2019-01-12 | Discharge: 2019-01-13 | Disposition: A | Discharge status: Other Acute Inpatient Hospital | Payer: BC Managed Care – PPO | Attending: Emergency Medicine | Admitting: Emergency Medicine

## 2019-01-12 DIAGNOSIS — E039 Hypothyroidism, unspecified: Secondary | ICD-10-CM | POA: Diagnosis not present

## 2019-01-12 DIAGNOSIS — R45851 Suicidal ideations: Secondary | ICD-10-CM | POA: Insufficient documentation

## 2019-01-12 DIAGNOSIS — Z046 Encounter for general psychiatric examination, requested by authority: Secondary | ICD-10-CM | POA: Diagnosis not present

## 2019-01-12 DIAGNOSIS — E1022 Type 1 diabetes mellitus with diabetic chronic kidney disease: Secondary | ICD-10-CM | POA: Insufficient documentation

## 2019-01-12 DIAGNOSIS — Z87891 Personal history of nicotine dependence: Secondary | ICD-10-CM | POA: Insufficient documentation

## 2019-01-12 DIAGNOSIS — Z20828 Contact with and (suspected) exposure to other viral communicable diseases: Secondary | ICD-10-CM | POA: Insufficient documentation

## 2019-01-12 DIAGNOSIS — I129 Hypertensive chronic kidney disease with stage 1 through stage 4 chronic kidney disease, or unspecified chronic kidney disease: Secondary | ICD-10-CM | POA: Insufficient documentation

## 2019-01-12 DIAGNOSIS — Z7982 Long term (current) use of aspirin: Secondary | ICD-10-CM | POA: Insufficient documentation

## 2019-01-12 DIAGNOSIS — N184 Chronic kidney disease, stage 4 (severe): Secondary | ICD-10-CM | POA: Diagnosis not present

## 2019-01-12 DIAGNOSIS — F32A Depression, unspecified: Secondary | ICD-10-CM

## 2019-01-12 DIAGNOSIS — Z794 Long term (current) use of insulin: Secondary | ICD-10-CM | POA: Insufficient documentation

## 2019-01-12 DIAGNOSIS — Z79899 Other long term (current) drug therapy: Secondary | ICD-10-CM | POA: Insufficient documentation

## 2019-01-12 DIAGNOSIS — F329 Major depressive disorder, single episode, unspecified: Secondary | ICD-10-CM | POA: Diagnosis not present

## 2019-01-12 LAB — CBC
HCT: 40.1 % (ref 36.0–46.0)
Hemoglobin: 12.5 g/dL (ref 12.0–15.0)
MCH: 28.4 pg (ref 26.0–34.0)
MCHC: 31.2 g/dL (ref 30.0–36.0)
MCV: 91.1 fL (ref 80.0–100.0)
Platelets: 268 10*3/uL (ref 150–400)
RBC: 4.4 MIL/uL (ref 3.87–5.11)
RDW: 13.9 % (ref 11.5–15.5)
WBC: 11 10*3/uL — ABNORMAL HIGH (ref 4.0–10.5)
nRBC: 0 % (ref 0.0–0.2)

## 2019-01-12 LAB — CBG MONITORING, ED
Glucose-Capillary: 125 mg/dL — ABNORMAL HIGH (ref 70–99)
Glucose-Capillary: 266 mg/dL — ABNORMAL HIGH (ref 70–99)
Glucose-Capillary: 474 mg/dL — ABNORMAL HIGH (ref 70–99)
Glucose-Capillary: 480 mg/dL — ABNORMAL HIGH (ref 70–99)

## 2019-01-12 LAB — COMPREHENSIVE METABOLIC PANEL
ALT: 14 U/L (ref 0–44)
AST: 11 U/L — ABNORMAL LOW (ref 15–41)
Albumin: 3.4 g/dL — ABNORMAL LOW (ref 3.5–5.0)
Alkaline Phosphatase: 97 U/L (ref 38–126)
Anion gap: 8 (ref 5–15)
BUN: 23 mg/dL — ABNORMAL HIGH (ref 6–20)
CO2: 26 mmol/L (ref 22–32)
Calcium: 9.3 mg/dL (ref 8.9–10.3)
Chloride: 100 mmol/L (ref 98–111)
Creatinine, Ser: 2.08 mg/dL — ABNORMAL HIGH (ref 0.44–1.00)
GFR calc Af Amer: 32 mL/min — ABNORMAL LOW (ref >60)
GFR calc non Af Amer: 28 mL/min — ABNORMAL LOW (ref >60)
Glucose, Bld: 278 mg/dL — ABNORMAL HIGH (ref 70–99)
Potassium: 4.4 mmol/L (ref 3.5–5.1)
Sodium: 134 mmol/L — ABNORMAL LOW (ref 135–145)
Total Bilirubin: 0.7 mg/dL (ref 0.3–1.2)
Total Protein: 6.7 g/dL (ref 6.5–8.1)

## 2019-01-12 LAB — SALICYLATE LEVEL: Salicylate Lvl: <7 mg/dL (ref 2.8–30.0)

## 2019-01-12 LAB — ETHANOL: Alcohol, Ethyl (B): <10 mg/dL (ref <10)

## 2019-01-12 LAB — RAPID URINE DRUG SCREEN, HOSP PERFORMED
Amphetamines: NOT DETECTED
Barbiturates: NOT DETECTED
Benzodiazepines: NOT DETECTED
Cocaine: NOT DETECTED
Opiates: NOT DETECTED
Tetrahydrocannabinol: NOT DETECTED

## 2019-01-12 LAB — ACETAMINOPHEN LEVEL: Acetaminophen (Tylenol), Serum: <10 ug/mL — ABNORMAL LOW (ref 10–30)

## 2019-01-12 LAB — SARS CORONAVIRUS 2 BY RT PCR (HOSPITAL ORDER, PERFORMED IN ~~LOC~~ HOSPITAL LAB): SARS Coronavirus 2: NEGATIVE

## 2019-01-12 MED ORDER — INSULIN ASPART 100 UNIT/ML ~~LOC~~ SOLN
0.0000 [IU] | SUBCUTANEOUS | Status: DC
  Administered 2019-01-12: 20:00:00 20 [IU] via SUBCUTANEOUS
  Administered 2019-01-13: 06:00:00 15 [IU] via SUBCUTANEOUS
  Filled 2019-01-12 (×2): qty 1

## 2019-01-12 MED ORDER — INSULIN ASPART 100 UNIT/ML ~~LOC~~ SOLN
20.0000 [IU] | Freq: Once | SUBCUTANEOUS | Status: AC
  Administered 2019-01-12: 22:00:00 20 [IU] via SUBCUTANEOUS
  Filled 2019-01-12: qty 1

## 2019-01-12 NOTE — [facility] Counselor (Signed)
Disposition: Vivien Rossetti, NP, recommend inpatient treatment

## 2019-01-12 NOTE — Progress Notes (Signed)
Pt accepted to [facility]; [facility]  Dr. Lynett Grimes is the accepting/attending provider.    Call report to [phone]   [facility] notified.     Pt is voluntary and can be transported by [employer].    Pt may be transported to [facility] as soon as her Covid test results are confirmed negative.   Audree Camel, LCSW, [place] Disposition [facility]/TTS [phone]

## 2019-01-12 NOTE — ED Provider Notes (Addendum)
[facility] EMERGENCY DEPARTMENT Provider Note   CSN: [id number] Arrival date & time: 01/12/19  1436    History   Chief Complaint Chief Complaint  Patient presents with  . Suicidal    HPI Jasmine [name] is a 48 y.o. female.     Level 5 caveat for psychiatric condition.  Patient reports depression and suicidal ideation.  She has an insulin pump and states this was a possible avenue to commit suicide.  2 previous behavioral health admissions.  Patient states multiple stressors at home, but was nonspecific about them.      Past Medical History:  Diagnosis Date  . Anxiety   . Balance problems   . Bipolar disorder ([place])   . Chronic fatigue   . CKD (chronic kidney disease), stage II   . Depression   . Diabetes mellitus   . DKA, type 1 ([place]) 11/04/2011  . Elevated cholesterol   . Fibromyalgia   . GERD (gastroesophageal reflux disease)   . Headache   . History of suicidal ideation   . Hyperlipemia   . Hypertension   . Hypothyroidism   . IBS (irritable bowel syndrome)   . Obesity   . Sleep apnea    HAS C -PAP / DOES NOT USE  . Stress incontinence    Pt had surgery to correct this.  . Tachycardia   . Tobacco abuse   . Tremor   . UTI (lower urinary tract infection)     Patient Active Problem List   Diagnosis Date Noted  . Dizziness 09/27/2018  . Polypharmacy 09/27/2018  . Severe recurrent major depression without psychotic features ([place]) 08/30/2018  . Bipolar 2 disorder, major depressive episode ([place]) 07/10/2018  . MDD (major depressive disorder), recurrent, severe, with psychosis ([place]) 07/10/2018  . CKD (chronic kidney disease), stage IV ([place]) 07/01/2018  . Transient Hypoglycemia 07/01/2018  . Chronic migraine 01/17/2017  . Diabetic peripheral neuropathy ([place]) 01/17/2017  . OSA (obstructive sleep apnea) 07/27/2016  . Wheezing 07/27/2016  . Hyperglycemia 12/25/2012  . Acute on chronic renal failure ([place]) 12/25/2012  . Pulmonary infiltrates 10/02/2012  .  Postnasal drip 10/02/2012  . Hyperkalemia 09/25/2012  . Morbid obesity ([place]) 09/24/2012  . HTN (hypertension) 09/24/2012  . Bipolar II disorder ([place]) 09/24/2012  . URI (upper respiratory infection) 09/24/2012  . DKA, type 1 ([place]) 11/04/2011  . Gastroenteritis 11/03/2011  . DM (diabetes mellitus), type 1, uncontrolled ([place]) 11/03/2011  . Hyponatremia 11/03/2011  . Elevated lipase 11/03/2011  . Hypothyroidism 11/03/2011  . Tobacco abuse 11/03/2011  . SUI (stress urinary incontinence, female) 08/16/2011    Past Surgical History:  Procedure Laterality Date  . INCONTINENCE SURGERY    . NASAL FRACTURE SURGERY    . ovary removed    . OVARY SURGERY    . PUBOVAGINAL SLING  08/16/2011   Procedure: [name];  Surgeon: Bernestine Amass, MD;  Location: [facility];  Service: Urology;  Laterality: N/A;         . UTERINE FIBROID SURGERY  2001     OB History    Gravida  1   Para      Term      Preterm      AB      Living        SAB      TAB      Ectopic      Multiple      Live Births  Home Medications    Prior to Admission medications   Medication Sig Start Date End Date Taking? Authorizing Provider  aspirin EC 81 MG tablet Take 81 mg by mouth at bedtime.     [provider]  buPROPion (WELLBUTRIN XL) 300 MG 24 hr tablet Take 1 tablet (300 mg total) by mouth daily. 12/06/18   Norman Clay, MD  carvedilol (COREG) 25 MG tablet Take 25 mg by mouth 2 (two) times daily with a meal.    [provider]  cephALEXin (KEFLEX) 500 MG capsule Take 1 capsule (500 mg total) by mouth 2 (two) times daily. 12/14/18   Orpah Greek, MD  Cholecalciferol (VITAMIN D3 SUPER STRENGTH) 50 MCG (2000 UT) TABS Take 1 tablet by mouth daily.  07/26/18   [provider]  dicyclomine (BENTYL) 10 MG capsule Take 10 mg by mouth 3 (three) times daily as needed for spasms.    [provider]  DULoxetine (CYMBALTA) 60 MG capsule Take 1  capsule (60 mg total) by mouth at bedtime. 12/06/18   Norman Clay, MD  fluticasone (FLONASE) 50 MCG/ACT nasal spray Place 2 sprays into the nose daily as needed for allergies.     [provider]  folic acid (FOLVITE) A999333 MCG tablet Take 400 mcg by mouth every evening.     [provider]  furosemide (LASIX) 20 MG tablet Take 20 mg by mouth daily.     [provider]  insulin glargine (LANTUS) 100 UNIT/ML injection Inject 0.5 mLs (50 Units total) into the skin daily. 09/04/18   Derrill Center, NP  lamoTRIgine (LAMICTAL) 200 MG tablet Take 1 tablet (200 mg total) by mouth daily. 12/06/18   Norman Clay, MD  levothyroxine (SYNTHROID, LEVOTHROID) 150 MCG tablet Take 150 mcg by mouth daily before breakfast.    [provider]  LORazepam (ATIVAN) 0.5 MG tablet Take 1 tablet (0.5 mg total) by mouth 2 (two) times daily as needed for anxiety. 12/06/18   Norman Clay, MD  lubiprostone (AMITIZA) 8 MCG capsule Take 8 mcg by mouth as needed for constipation.     [provider]  NOVOLOG 100 UNIT/ML injection Inject 2.425-2.95 Units into the skin continuous. Via pump 7pm to midnight 2.950 8:30am -7pm2.925 Midnight - 8:30 2.425 02/24/17   [provider]  Omega-3 Fatty Acids (FISH OIL PO) Take 1 capsule by mouth daily.    [provider]  omeprazole (PRILOSEC) 20 MG capsule Take 20 mg by mouth at bedtime.     [provider]  ondansetron (ZOFRAN ODT) 4 MG disintegrating tablet Take 1 tablet (4 mg total) by mouth every 8 (eight) hours as needed. Patient taking differently: Take 4 mg by mouth every 8 (eight) hours as needed for nausea or vomiting.  01/17/17   Marcial Pacas, MD  pravastatin (PRAVACHOL) 80 MG tablet Take 80 mg by mouth at bedtime.    [provider]  PROAIR HFA 108 [phone] Base) MCG/ACT inhaler Inhale 1-2 puffs into the lungs every 6 (six) hours as needed for wheezing or shortness of breath.  12/30/16   [provider]   rizatriptan (MAXALT-MLT) 10 MG disintegrating tablet Take 1 tablet (10 mg total) as needed by mouth. May repeat in 2 hours if needed Patient taking differently: Take 10 mg by mouth as needed for migraine. May repeat in 2 hours if needed 04/20/17   Marcial Pacas, MD  traMADol (ULTRAM) 50 MG tablet Take 50 mg by mouth 2 (two) times daily.  07/14/18   [provider]    Family History Family History  Problem Relation Age of Onset  . Asthma Mother   . Bipolar disorder Mother   . Heart disease Father   . Lymphoma Father   . Hypertension Father   . Thyroid disease Father   . Hyperlipidemia Father   . Diabetes Father   . Cancer Paternal Grandmother        lung and breast  . Bladder Cancer Paternal Grandfather   . Suicidality Maternal Grandfather   . Thyroid disease Brother     Social History Social History   Tobacco Use  . Smoking status: Former Smoker    Packs/day: 0.75    Years: 20.00    Pack years: 15.00    Types: Cigarettes    Quit date: 06/07/2012    Years since quitting: 6.6  . Smokeless tobacco: Never Used  Substance Use Topics  . Alcohol use: No  . Drug use: No     Allergies   Ciprofloxacin, Levaquin [levofloxacin], Buspar [buspirone], Linaclotide, Advair diskus [fluticasone-salmeterol], Biaxin [clarithromycin], and Hydroxyzine   Review of Systems Review of Systems  Unable to perform ROS: Psychiatric disorder     Physical Exam Updated Vital Signs Ht 5\' 9"  (1.753 m)   Wt 136.1 kg   BMI 44.30 kg/m   Physical Exam Vitals signs and nursing note reviewed.  Constitutional:      Appearance: She is well-developed.     Comments: Elevated BMI.  HENT:     Head: Normocephalic and atraumatic.  Eyes:     Conjunctiva/sclera: Conjunctivae normal.  Neck:     Musculoskeletal: Neck supple.  Cardiovascular:     Rate and Rhythm: Normal rate and regular rhythm.  Pulmonary:     Effort: Pulmonary effort is normal.     Breath sounds: Normal breath sounds.   Abdominal:     General: Bowel sounds are normal.     Palpations: Abdomen is soft.  Musculoskeletal: Normal range of motion.  Skin:    General: Skin is warm and dry.  Neurological:     Mental Status: She is alert and oriented to person, place, and time.  Psychiatric:     Comments: Flat affect, depressed      ED Treatments / Results  Labs (all labs ordered are listed, but only abnormal results are displayed) Labs Reviewed  COMPREHENSIVE METABOLIC PANEL - Abnormal; Notable for the following components:      Result Value   Sodium 134 (*)    Glucose, Bld 278 (*)    BUN 23 (*)    Creatinine, Ser 2.08 (*)    Albumin 3.4 (*)    AST 11 (*)    GFR calc non Af Amer 28 (*)    GFR calc Af Amer 32 (*)    All other components within normal limits  ACETAMINOPHEN LEVEL - Abnormal; Notable for the following components:   Acetaminophen (Tylenol), Serum <10 (*)    All other components within normal limits  CBC - Abnormal; Notable for the following components:   WBC 11.0 (*)    All other components within normal limits  CBG MONITORING, ED - Abnormal; Notable for the following components:   Glucose-Capillary 266 (*)    All other components within normal limits  ETHANOL  SALICYLATE LEVEL  RAPID URINE DRUG SCREEN, HOSP PERFORMED    EKG None  Radiology No results found.  Procedures Procedures (including critical care time)  Medications Ordered in ED Medications  insulin aspart (  novoLOG) injection 0-20 Units (has no administration in time range)     Initial Impression / Assessment and Plan / ED Course  I have reviewed the triage vital signs and the nursing notes.  Pertinent labs & imaging results that were available during my care of the patient were reviewed by me and considered in my medical decision making (see chart for details).        Patient is depressed with suicidal ideation.  Probable psychiatric admission.  Behavioral health consult recommends inpatient  admission.  I ordered insulin sliding scale for her diabetes.  Final Clinical Impressions(s) / ED Diagnoses   Final diagnoses:  Depression, unspecified depression type    ED Discharge Orders    None       [name], MD 01/12/19 1550    [name], MD 01/12/19 1902

## 2019-01-12 NOTE — ED Triage Notes (Signed)
Pt reports she has been suicidal for awhile. Reports she has planned to overdose on insulin, or overdose on benzos. She has many stressors voiced

## 2019-01-12 NOTE — Progress Notes (Signed)
Pt meets inpatient criteria per Sheran Fava, NP. Referral information has been sent to the following hospitals for review:  [facility]  [facility]      Disposition will continue to assist with inpatient placement needs.   Audree Camel, LCSW, [place] Disposition [facility]/TTS [phone]

## 2019-01-12 NOTE — ED Notes (Signed)
Pt removed insulin pump . Placed 2 belongings bag in room

## 2019-01-12 NOTE — Telephone Encounter (Signed)
Cln returned pt call. Pt reports she was unable to send paperwork back and didn't know how to sign on for the appointment on Monday. Cln walks patient through the process of WebEx and sends patient another email to reply with paperwork. Pt agrees to schedule [facility] for Wednesday, 8/12 @ 10a. Cln asks patient if she is having any SI/HI/AVH and pt is silent for about 10 seconds when replies, tearfully, "yes." Pt states "I just feel like everyone would be better off and I wouldn't hurt anymore." Cln asks if pt discussed these thoughts with Dr. Modesta Messing at the appointment yesterday and pt clarifies she saw her therapist, not Dr. Modesta Messing yesterday. Cln asks what pt's therapist recommended and pt reports "that I come to [facility] ([facility]). Cln probes and pt reports she does have a plan; "I would take a handful of benzos and overdose on my Insulin." Pt would not answer if she has intent to follow-through with plan. Cln asks if pt is willing to go to the [facility] hospital or ED? Pt reports "I guess." When asked which, pt states "I guess [facility]." Cln asks who is available to take pt to the ED and pt responds "My Dad or husband." Cln asks where they are currently and pt reports husband is sitting next to her. Cln asks if she can speak with husband and pt agrees.   Cln speaks with husband, Rubicela Fero, and he reports that pt is depressed. Cln explains pt needs to be seen by a provider in person to verify she is safe and can continue to maintain that safety at home. Husband reports understanding. Cln asks husband if he is willing to take pt to the ED and husband states "yeah." Cln again asks where they will go and husband says "[facility]." Cln asks when they will go and husband states "She wants to wait and go later on this afternoon." Cln explains that isn't an option at this point because safety is a real concern and pt needs to go now. Husband agrees. Cln asks again when they will go, husband asks pt, and pt reports "We will go at  lunch time." Cln verifies "lunch time" means 12, noon which is in 45 minutes. Pt and husband agree. Cln tells pt and husband that a [place] check may need to be done by the police if they do not report to the ED to check on safety. Cln explains that pt's safety is our number one priority and this is the best way to ensure that at this time. Pt and husband report understanding. Husband provides his number to reach pt: [phone].   Cln tells husband and pt that she will call to followup with them in about an hour. Pt agrees. Cln asks pt if it is OK to contact Dr. Modesta Messing to tell her what is going on and pt agrees.  Cln again asks if they will be at [facility] in about 45 minutes and pt responds "yeah."  Cln calls Beather Arbour for direct contact information for Dr. Modesta Messing. Raquel Sarna provides number.  Cln calls Dr. Modesta Messing and is able to speak with her once Dr. Modesta Messing quickly returns msg left by cln. Cln explains call. Dr. Modesta Messing reports understanding. Cln cannot IVC if necessary. Dr. Modesta Messing reports she will reach out to patient as well.  Cln will f/u with pt to ensure pt went to [facility].
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
## 2019-01-12 NOTE — Telephone Encounter (Signed)
Received a call from Ms. Jasmine [name], PHP therapist regarding safety concern about the patient. Please see details documented by her note.   Contacted the patient.  She states that she is not doing well. She feels overwhelmed and does not know what to do. She admits having SI of overdosing benzodiazepine to go to sleep. She is planning to go to [facility] after her husband returns home around noon. She feels comfortable staying at home by herself (she states that she is by herself at home) until her husband comes back. She is aware of emergency resources (911, ED, suicide crisis call) as needed. She agrees this Probation officer to contact with her husband.   Contacted her husband, Mr. Jasmine [name] [phone] He states that he is planning to bring her to [facility] as soon as she takes a shower. He is at the house. He is aware of patient SI. He denies any other concern. He is informed of emergency resources if needed.   Notified the ED about this patient.

## 2019-01-12 NOTE — ED Notes (Signed)
Patient's blood sugar is 480/ Dr. Lacinda Axon Notified and stated to give 20 units.

## 2019-01-13 LAB — CBG MONITORING, ED
Glucose-Capillary: 376 mg/dL — ABNORMAL HIGH (ref 70–99)
Glucose-Capillary: 64 mg/dL — ABNORMAL LOW (ref 70–99)
Glucose-Capillary: 79 mg/dL (ref 70–99)

## 2019-01-13 MED ORDER — ACETAMINOPHEN 325 MG PO TABS
650.0000 mg | ORAL_TABLET | Freq: Once | ORAL | Status: AC
  Administered 2019-01-13: 650 mg via ORAL
  Filled 2019-01-13: qty 2

## 2019-01-13 MED ORDER — LAMOTRIGINE 25 MG PO TABS
200.0000 mg | ORAL_TABLET | Freq: Once | ORAL | Status: AC
  Administered 2019-01-13: 01:00:00 200 mg via ORAL
  Filled 2019-01-13: qty 8

## 2019-01-13 NOTE — ED Provider Notes (Signed)
Pt accepted to [facility], Dr. Seward Speck. Will transfer stable.    Francine Graven, DO 01/13/19 [phone]

## 2019-01-13 NOTE — ED Notes (Signed)
Unable to get in touch with [facility]. [place] notified the numbers we have either are not working or no answer. They will get in touch with them to call us for report.

## 2019-01-13 NOTE — ED Notes (Signed)
Pt CBG 64, pt given graham crackers w/peanut butter & orange juice

## 2019-01-17 ENCOUNTER — Telehealth (HOSPITAL_COMMUNITY): Payer: Self-pay | Admitting: Professional

## 2019-01-17 ENCOUNTER — Other Ambulatory Visit: Payer: Self-pay

## 2019-01-17 ENCOUNTER — Other Ambulatory Visit (HOSPITAL_COMMUNITY): Payer: BC Managed Care – PPO | Attending: Psychiatry | Admitting: Licensed Clinical Social Worker

## 2019-01-17 DIAGNOSIS — K219 Gastro-esophageal reflux disease without esophagitis: Secondary | ICD-10-CM | POA: Insufficient documentation

## 2019-01-17 DIAGNOSIS — Z794 Long term (current) use of insulin: Secondary | ICD-10-CM | POA: Insufficient documentation

## 2019-01-17 DIAGNOSIS — Z803 Family history of malignant neoplasm of breast: Secondary | ICD-10-CM | POA: Insufficient documentation

## 2019-01-17 DIAGNOSIS — Z881 Allergy status to other antibiotic agents status: Secondary | ICD-10-CM | POA: Insufficient documentation

## 2019-01-17 DIAGNOSIS — Z818 Family history of other mental and behavioral disorders: Secondary | ICD-10-CM | POA: Insufficient documentation

## 2019-01-17 DIAGNOSIS — N182 Chronic kidney disease, stage 2 (mild): Secondary | ICD-10-CM | POA: Diagnosis not present

## 2019-01-17 DIAGNOSIS — F319 Bipolar disorder, unspecified: Secondary | ICD-10-CM | POA: Insufficient documentation

## 2019-01-17 DIAGNOSIS — Z915 Personal history of self-harm: Secondary | ICD-10-CM | POA: Diagnosis not present

## 2019-01-17 DIAGNOSIS — I129 Hypertensive chronic kidney disease with stage 1 through stage 4 chronic kidney disease, or unspecified chronic kidney disease: Secondary | ICD-10-CM | POA: Insufficient documentation

## 2019-01-17 DIAGNOSIS — Z833 Family history of diabetes mellitus: Secondary | ICD-10-CM | POA: Insufficient documentation

## 2019-01-17 DIAGNOSIS — G4730 Sleep apnea, unspecified: Secondary | ICD-10-CM | POA: Insufficient documentation

## 2019-01-17 DIAGNOSIS — E039 Hypothyroidism, unspecified: Secondary | ICD-10-CM | POA: Diagnosis not present

## 2019-01-17 DIAGNOSIS — Z8249 Family history of ischemic heart disease and other diseases of the circulatory system: Secondary | ICD-10-CM | POA: Diagnosis not present

## 2019-01-17 DIAGNOSIS — Z7982 Long term (current) use of aspirin: Secondary | ICD-10-CM | POA: Diagnosis not present

## 2019-01-17 DIAGNOSIS — Z886 Allergy status to analgesic agent status: Secondary | ICD-10-CM | POA: Insufficient documentation

## 2019-01-17 DIAGNOSIS — E785 Hyperlipidemia, unspecified: Secondary | ICD-10-CM | POA: Insufficient documentation

## 2019-01-17 DIAGNOSIS — E7800 Pure hypercholesterolemia, unspecified: Secondary | ICD-10-CM | POA: Diagnosis not present

## 2019-01-17 DIAGNOSIS — Z87891 Personal history of nicotine dependence: Secondary | ICD-10-CM | POA: Insufficient documentation

## 2019-01-17 DIAGNOSIS — E669 Obesity, unspecified: Secondary | ICD-10-CM | POA: Insufficient documentation

## 2019-01-17 DIAGNOSIS — Z807 Family history of other malignant neoplasms of lymphoid, hematopoietic and related tissues: Secondary | ICD-10-CM | POA: Insufficient documentation

## 2019-01-17 DIAGNOSIS — E1022 Type 1 diabetes mellitus with diabetic chronic kidney disease: Secondary | ICD-10-CM | POA: Diagnosis not present

## 2019-01-17 DIAGNOSIS — Z79890 Hormone replacement therapy (postmenopausal): Secondary | ICD-10-CM | POA: Diagnosis not present

## 2019-01-17 DIAGNOSIS — Z8349 Family history of other endocrine, nutritional and metabolic diseases: Secondary | ICD-10-CM | POA: Insufficient documentation

## 2019-01-17 DIAGNOSIS — Z885 Allergy status to narcotic agent status: Secondary | ICD-10-CM | POA: Insufficient documentation

## 2019-01-17 DIAGNOSIS — Z888 Allergy status to other drugs, medicaments and biological substances status: Secondary | ICD-10-CM | POA: Insufficient documentation

## 2019-01-17 DIAGNOSIS — Z8052 Family history of malignant neoplasm of bladder: Secondary | ICD-10-CM | POA: Insufficient documentation

## 2019-01-17 DIAGNOSIS — Z801 Family history of malignant neoplasm of trachea, bronchus and lung: Secondary | ICD-10-CM | POA: Insufficient documentation

## 2019-01-17 DIAGNOSIS — M797 Fibromyalgia: Secondary | ICD-10-CM | POA: Insufficient documentation

## 2019-01-17 DIAGNOSIS — F3181 Bipolar II disorder: Secondary | ICD-10-CM

## 2019-01-17 DIAGNOSIS — F419 Anxiety disorder, unspecified: Secondary | ICD-10-CM | POA: Diagnosis not present

## 2019-01-18 ENCOUNTER — Telehealth (HOSPITAL_COMMUNITY): Payer: Self-pay | Admitting: Professional

## 2019-01-18 NOTE — Progress Notes (Deleted)
01/18/2019 11:00-12:15 Group met via Web-ex due to COVID-19 precautions  Pt attended spirituality group facilitated by Simone Curia, MD[facility]   Group focused on topic of "self-care."  Patients engaged in facilitated dialog around their perception of topic.  Utilized list of quotes to identify images of self care which which they connected and an image of self care which did not connect.  Engaged in facilitated conversation around their chosen images of self care, influences on self care, and current practices of self care in their life.
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
## 2019-01-22 ENCOUNTER — Other Ambulatory Visit: Payer: Self-pay

## 2019-01-22 ENCOUNTER — Other Ambulatory Visit (HOSPITAL_COMMUNITY): Payer: BC Managed Care – PPO | Admitting: Licensed Clinical Social Worker

## 2019-01-22 DIAGNOSIS — F3181 Bipolar II disorder: Secondary | ICD-10-CM

## 2019-01-23 ENCOUNTER — Other Ambulatory Visit (HOSPITAL_COMMUNITY): Payer: BC Managed Care – PPO | Admitting: Licensed Clinical Social Worker

## 2019-01-23 ENCOUNTER — Encounter (HOSPITAL_COMMUNITY): Payer: Self-pay

## 2019-01-23 ENCOUNTER — Other Ambulatory Visit (HOSPITAL_COMMUNITY): Payer: BC Managed Care – PPO | Admitting: Occupational Therapy

## 2019-01-23 ENCOUNTER — Other Ambulatory Visit: Payer: Self-pay

## 2019-01-23 DIAGNOSIS — F3181 Bipolar II disorder: Secondary | ICD-10-CM

## 2019-01-23 DIAGNOSIS — R4589 Other symptoms and signs involving emotional state: Secondary | ICD-10-CM

## 2019-01-23 NOTE — Psych (Signed)
Virtual Visit via Video Note  I connected with Jasmine [name] on 01/22/19 at  9:00 AM EDT by a video enabled telemedicine application and verified that I am speaking with the correct person using two identifiers.   I discussed the limitations of evaluation and management by telemedicine and the availability of in person appointments. The patient expressed understanding and agreed to proceed.  I discussed the assessment and treatment plan with the patient. The patient was provided an opportunity to ask questions and all were answered. The patient agreed with the plan and demonstrated an understanding of the instructions.   The patient was advised to call back or seek an in-person evaluation if the symptoms worsen or if the condition fails to improve as anticipated.  I provided 10 minutes of non-face-to-face time during this encounter.  Pt verbally agrees to treatment plan.  [place], L[facility], LCASA

## 2019-01-23 NOTE — Progress Notes (Signed)
Virtual Visit via Video Note  I connected with Jasmine [name] on 01/23/19 at  9:00 AM EDT by a video enabled telemedicine application and verified that I am speaking with the correct person using two identifiers.   I discussed the limitations of evaluation and management by telemedicine and the availability of in person appointments. The patient expressed understanding and agreed to proceed.    I discussed the assessment and treatment plan with the patient. The patient was provided an opportunity to ask questions and all were answered. The patient agreed with the plan and demonstrated an understanding of the instructions.   The patient was advised to call back or seek an in-person evaluation if the symptoms worsen or if the condition fails to improve as anticipated.  I provided 00 minutes of non-face-to-face time during this encounter.   Derrill Center, NP    Behavioral Health Partial Program Assessment Note  Date: 01/23/2019 Name: Jasmine [name] MRN: [id number]     HPI: Jasmine [name] t is a 48 y.o. Caucasian female presents with worsening depression and anxiety.  Laurien stated a history of passive suicidal ideations that appear to be chronic in nature.  Reports previous suicide attempts to overdose.  Currently denying intent or plan.  Reports multiple stressors to include her current family situation.  States she is not happy in her marriage.  "  Feels like her roommates."  states her father is currently residing in their household which is causing undue stress.  Rhunette reports some infidelities on her side due to the lack of attention received by her husband.  She reports feeling worthless and frustration with the lack of communicat communication between she and her husband.  Stated the poor communication skills is related to her husband being on the autistic spectrum (undiagnosed)  "  We both believe he is high functioning." reports her husband not interested in attending  marital counseling at this time.  Reports her husband works for most of the day.  Ms. Guymon reported she was recently denied to renew her disability which is put a strain on her finances.  Reports she is currently followed by Dr. Modesta Messing and sees a therapist regularly.  Patient was enrolled in partial psychiatric program on 01/23/19.  Primary complaints include: depression worse, poor concentration and relationship difficulties.  Onset of symptoms was gradual with gradually worsening course since that time. Psychosocial Stressors include the following: family and financial.  Reported previous inpatient admissions.  Reports family history of mental illness.  Reports paternal aunts schizoaffective disorder.  Mother: Bipolar Brother: Schizophrenia  I have reviewed the following documentation dated 01/23/2019: past psychiatric history, past medical history and past social and family history  Complaints of Pain: nonear Past Psychiatric History:  First psychiatric contact, Past psychiatric hospitalizations and Previous suicide attempts  Currently in treatment with Wellbutrin 300 daily.  Lamictal 200 daily Cymbalta 60 mg daily  Substance Abuse History: Use of Alcohol: denied Use of Caffeine: denies use Use of over the counter:   Past Surgical History:  Procedure Laterality Date  . INCONTINENCE SURGERY    . NASAL FRACTURE SURGERY    . ovary removed    . OVARY SURGERY    . PUBOVAGINAL SLING  08/16/2011   Procedure: [name];  Surgeon: Bernestine Amass, MD;  Location: [facility];  Service: Urology;  Laterality: N/A;         . UTERINE FIBROID SURGERY  2001    Past Medical History:  Diagnosis Date  .  Anxiety   . Balance problems   . Bipolar disorder ([place])   . Chronic fatigue   . CKD (chronic kidney disease), stage II   . Depression   . Diabetes mellitus   . DKA, type 1 ([place]) 11/04/2011  . Elevated cholesterol   . Fibromyalgia   . GERD (gastroesophageal reflux disease)   . Headache    . History of suicidal ideation   . Hyperlipemia   . Hypertension   . Hypothyroidism   . IBS (irritable bowel syndrome)   . Obesity   . Sleep apnea    HAS C -PAP / DOES NOT USE  . Stress incontinence    Pt had surgery to correct this.  . Tachycardia   . Tobacco abuse   . Tremor   . UTI (lower urinary tract infection)    Outpatient Encounter Medications as of 01/23/2019  Medication Sig  . aspirin EC 81 MG tablet Take 81 mg by mouth at bedtime.   Marland Kitchen buPROPion (WELLBUTRIN XL) 300 MG 24 hr tablet Take 1 tablet (300 mg total) by mouth daily.  . carvedilol (COREG) 25 MG tablet Take 25 mg by mouth 2 (two) times daily with a meal.  . cephALEXin (KEFLEX) 500 MG capsule Take 1 capsule (500 mg total) by mouth 2 (two) times daily.  . Cholecalciferol (VITAMIN D3 SUPER STRENGTH) 50 MCG (2000 UT) TABS Take 1 tablet by mouth daily.   Marland Kitchen dicyclomine (BENTYL) 10 MG capsule Take 10 mg by mouth 3 (three) times daily as needed for spasms.  . DULoxetine (CYMBALTA) 60 MG capsule Take 1 capsule (60 mg total) by mouth at bedtime.  . fluticasone (FLONASE) 50 MCG/ACT nasal spray Place 2 sprays into the nose daily as needed for allergies.   . folic acid (FOLVITE) A999333 MCG tablet Take 400 mcg by mouth every evening.   . furosemide (LASIX) 20 MG tablet Take 20 mg by mouth daily.   . insulin glargine (LANTUS) 100 UNIT/ML injection Inject 0.5 mLs (50 Units total) into the skin daily.  Marland Kitchen lamoTRIgine (LAMICTAL) 200 MG tablet Take 1 tablet (200 mg total) by mouth daily.  Marland Kitchen levothyroxine (SYNTHROID, LEVOTHROID) 150 MCG tablet Take 150 mcg by mouth daily before breakfast.  . LORazepam (ATIVAN) 0.5 MG tablet Take 1 tablet (0.5 mg total) by mouth 2 (two) times daily as needed for anxiety.  Marland Kitchen lubiprostone (AMITIZA) 8 MCG capsule Take 8 mcg by mouth as needed for constipation.   Marland Kitchen NOVOLOG 100 UNIT/ML injection Inject 2.425-2.95 Units into the skin continuous. Via pump 7pm to midnight 2.950 8:30am -7pm2.925 Midnight - 8:30  2.425  . Omega-3 Fatty Acids (FISH OIL PO) Take 1 capsule by mouth daily.  Marland Kitchen omeprazole (PRILOSEC) 20 MG capsule Take 20 mg by mouth at bedtime.   . ondansetron (ZOFRAN ODT) 4 MG disintegrating tablet Take 1 tablet (4 mg total) by mouth every 8 (eight) hours as needed. (Patient taking differently: Take 4 mg by mouth every 8 (eight) hours as needed for nausea or vomiting. )  . pravastatin (PRAVACHOL) 80 MG tablet Take 80 mg by mouth at bedtime.  Marland Kitchen PROAIR HFA 108 (90 Base) MCG/ACT inhaler Inhale 1-2 puffs into the lungs every 6 (six) hours as needed for wheezing or shortness of breath.   . rizatriptan (MAXALT-MLT) 10 MG disintegrating tablet Take 1 tablet (10 mg total) as needed by mouth. May repeat in 2 hours if needed (Patient taking differently: Take 10 mg by mouth as needed for migraine. May repeat in  2 hours if needed)  . traMADol (ULTRAM) 50 MG tablet Take 50 mg by mouth 2 (two) times daily.    No facility-administered encounter medications on file as of 01/23/2019.    Allergies  Allergen Reactions  . Ciprofloxacin Swelling and Other (See Comments)    Per pt caused lips swell and nauseous feeling  . Levaquin [Levofloxacin] Swelling and Other (See Comments)    Per pt caused lips swell and nauseous feeling  . Buspar [Buspirone] Other (See Comments)    abd cramping  . Linaclotide Other (See Comments)  . Advair Diskus [Fluticasone-Salmeterol] Other (See Comments)    Thrush   . Biaxin [Clarithromycin] Rash  . Hydroxyzine Palpitations    Social History   Tobacco Use  . Smoking status: Former Smoker    Packs/day: 0.75    Years: 20.00    Pack years: 15.00    Types: Cigarettes    Quit date: 06/07/2012    Years since quitting: 6.6  . Smokeless tobacco: Never Used  Substance Use Topics  . Alcohol use: No   Functioning Relationships: strained with spouse or significant others Education: College       Please specify degree: "Some college" Other Pertinent History: None Family History   Problem Relation Age of Onset  . Asthma Mother   . Bipolar disorder Mother   . Heart disease Father   . Lymphoma Father   . Hypertension Father   . Thyroid disease Father   . Hyperlipidemia Father   . Diabetes Father   . Cancer Paternal Grandmother        lung and breast  . Bladder Cancer Paternal Grandfather   . Suicidality Maternal Grandfather   . Thyroid disease Brother      Review of Systems Constitutional: negative  Objective:  There were no vitals filed for this visit.  Physical Exam:   Mental Status Exam: Appearance:  Well groomed Psychomotor::  Within Normal Limits Attention span and concentration: Normal Behavior: calm, cooperative and adequate rapport can be established Speech:  normal volume Mood:  depressed Affect: flat Thought Process:  Coherent Thought Content:  Logical Orientation:  person, place and time/date Cognition:  grossly intact Insight:  Intact Judgment:  Intact Estimate of Intelligence: Average Fund of knowledge: Aware of current events Memory: Recent and remote intact Abnormal movements: None Gait and station: Normal  Assessment:  Diagnosis: No primary diagnosis found. No diagnosis found.  Indications for admission: inpatient care required if not in partial hospital program  Plan: patient enrolled in Partial Hospitalization Program, patient's current medications are to be continued, a comprehensive treatment plan will be developed and side effects of medications have been reviewed with patient  Treatment options and alternatives reviewed with patient and patient understands the above plan.  Treatment plan was reviewed and agreed upon by NP T. Lewis inpatient Rachel Bo Stege need for group services.    Derrill Center, NP

## 2019-01-23 NOTE — Progress Notes (Signed)
Spoke with patient via Webex video call, used 2 identifiers to correctly identify patient. Referred by her psychiatrist Dr. Modesta Messing for anxiety and depression due to marital issues and finances. Her father also lives with her and her husband. She does have a boyfriend as well. On scale of 1-10 as 10 being worst she rates depression at 8 and anxiety at 6. Denies SI/HI but admits to seeing people walking in her home and outside at times. Also hears people talking but can't make out what they are saying. Has a migraine today and gets them 2-3 times a month. Currently dealing with a sinus infection and taking Kelfex. Group "went fine" yesterday and she has actually been interacting which she normally finds difficult. This is her first time in PHP. No side effects from medications or complaints. PHQ9=25. Flat depressed affect.

## 2019-01-24 ENCOUNTER — Other Ambulatory Visit (HOSPITAL_COMMUNITY): Payer: BC Managed Care – PPO | Admitting: Licensed Clinical Social Worker

## 2019-01-24 ENCOUNTER — Other Ambulatory Visit: Payer: Self-pay

## 2019-01-24 ENCOUNTER — Encounter (HOSPITAL_COMMUNITY): Payer: Self-pay

## 2019-01-24 DIAGNOSIS — F3181 Bipolar II disorder: Secondary | ICD-10-CM

## 2019-01-24 NOTE — Therapy (Signed)
[facility] [place], [place], [address] Phone: [phone]   Fax:  [phone]  Occupational Therapy Evaluation  Patient Details  Name: Jasmine [name] MRN: [id number] Date of Birth: 1971/02/29 Referring Provider (OT): Ricky Ala, NP  Virtual Visit via Video Note  I connected with Jasmine [name] on 01/24/19 at  8:00 AM EDT by a video enabled telemedicine application and verified that I am speaking with the correct person using two identifiers.   I discussed the limitations of evaluation and management by telemedicine and the availability of in person appointments. The patient expressed understanding and agreed to proceed.  I discussed the assessment and treatment plan with the patient. The patient was provided an opportunity to ask questions and all were answered. The patient agreed with the plan and demonstrated an understanding of the instructions.   The patient was advised to call back or seek an in-person evaluation if the symptoms worsen or if the condition fails to improve as anticipated.  I provided 60 minutes of non-face-to-face time during this encounter.   Zenovia Jarred, OT    Encounter Date: 01/23/2019  OT End of Session - 01/24/19 1118    Visit Number  1    Number of Visits  12    Date for OT Re-Evaluation  02/21/19    Authorization Type  [employer]    OT Start Time  1100    OT Stop Time  1200    OT Time Calculation (min)  60 min    Activity Tolerance  Patient tolerated treatment well    Behavior During Therapy  WFL for tasks assessed/performed       Past Medical History:  Diagnosis Date  . Anxiety   . Balance problems   . Bipolar disorder ([place])   . Chronic fatigue   . CKD (chronic kidney disease), stage II   . Depression   . Diabetes mellitus   . DKA, type 1 ([place]) 11/04/2011  . Elevated cholesterol   . Fibromyalgia   . GERD (gastroesophageal reflux disease)   . Headache   .  History of suicidal ideation   . Hyperlipemia   . Hypertension   . Hypothyroidism   . IBS (irritable bowel syndrome)   . Obesity   . Sleep apnea    HAS C -PAP / DOES NOT USE  . Stress incontinence    Pt had surgery to correct this.  . Tachycardia   . Tobacco abuse   . Tremor   . UTI (lower urinary tract infection)     Past Surgical History:  Procedure Laterality Date  . INCONTINENCE SURGERY    . NASAL FRACTURE SURGERY    . ovary removed    . OVARY SURGERY    . PUBOVAGINAL SLING  08/16/2011   Procedure: [name];  Surgeon: Bernestine Amass, MD;  Location: [facility];  Service: Urology;  Laterality: N/A;         . UTERINE FIBROID SURGERY  2001    There were no vitals filed for this visit.  Subjective Assessment - 01/24/19 1116    Currently in Pain?  Other (Comment)   chronic foot pain       [facility] OT Assessment - 01/24/19 0001      Assessment   Medical Diagnosis  Bipolar 2 disorder, major depressive disorder    Referring Provider (OT)  Ricky Ala, NP    Onset Date/Surgical Date  01/23/19  Precautions   Precautions  None      Restrictions   Weight Bearing Restrictions  No      Balance Screen   Has the patient fallen in the past 6 months  No    Has the patient had a decrease in activity level because of a fear of falling?   No    Is the patient reluctant to leave their home because of a fear of falling?   No         OT assessment: OCAIRS  Diagnosis: Bipolar 2 disorder  Past medical history/referral information: Pt presents to PHP after worsening depressive symptoms and recent [place] admissions  Living situation: Pt lives with spouse  ADLs/IADLs: Decreased engagement  Work: Unable to work at this time  Leisure: decreased engagement  Social support: decreased engagement  Engineer, building services of Client Scores:  FACILITATES [place]  RESTRICTS PARTICIPATION IN OCCUPATION COMMENTS  ROLES                 X    HABITS                 X    PERSONAL CAUSATION                 X    VALUES                 X    INTERESTS                 X    SKILLS                 X    SHORT TERM GOALS                 X    LONG TERM GOALS                 X    INTERPETATION OF PAST EXPERIENCES                 X    PHYSICAL ENVIRONMENT                 X    SOCIAL ENVIRONMENT                 X    READINESS FOR CHANGE                 X      Need for Occupational Therapy:  4 Shows positive occupational participation, no need for OT.   3 Need for minimal intervention/consultative participation     X 2 Need for OT intervention indicated to restore/improve participation   1 Need for extensive OT intervention indicated to improve participation.  Referral for follow up services also recommended.    Assessment:  Patient demonstrates behavior that inhibits participation in occupation.  Patient will benefit from occupational therapy intervention in order to improve time management, financial management, stress management, job readiness skills, social skills, and health management skills in preparation to return to full time community living and to be a productive community member.    Plan:  Patient will participate in skilled occupational therapy sessions individually or in a group setting to improve coping skills, psychosocial skills, and emotional skills required to return to prior level of function. Treatment will be 4-5 times per week for 3 weeks.  OT TREATMENT   S: My communication skills are poor   O: Education given on verbal communication skills this date. Fair fighting rules discussed in reference to a variety of relationships. Pt asked to apply certain rules to a current situation in life. Video clip of a relationship argument shown for pts to apply fair fighting rules and discuss with group."I statements" worksheet given to provide  education on appropriate communication styles when in emotional situations. Pt asked to apply personal example of I statement at end of session.   A: Pt presents with blunted affect, engaged and participatory throughout session. Pt shares that her communication skills are poor. She mentions how she can apply these skills in her relationship and daily social situations. She overall states her communication skills are poor and seeks to improve them.   P: OT group will be x3 per week while pt in PHP      OT Education - 01/24/19 1117    Education Details  education given on communication skills    Person(s) Educated  Patient    Methods  Explanation;Handout    Comprehension  Verbalized understanding       OT Short Term Goals - 01/24/19 1120      OT SHORT TERM GOAL #1   Title  Pt will be educated on strategies to improve psychosocial skills needed to participate fully in all daily, work, and leisure activities    Time  4    Period  Weeks    Status  New    Target Date  02/21/19      OT SHORT TERM GOAL #2   Title  Pt will apply psychosocial skills and coping mechanisms to daily activities in order to function independently and reintegrate into community    Time  4    Period  Weeks    Status  New      OT SHORT TERM GOAL #3   Title  Pt will recall and/or apply 1-3 sleep hygiene strategies to improve function in BADL routine upon reintegrating into community    Time  4    Period  Weeks    Status  New      OT SHORT TERM GOAL #4   Title  Pt will engage in goal setting to improve functional BADL/IADL routine upon reintegrating into community.    Time  4    Period  Weeks    Status  New               Plan - 01/24/19 1119    OT Occupational Profile and History  Problem Focused Assessment - Including review of records relating to presenting problem    Occupational performance deficits (Please refer to evaluation for details):  ADL's;IADL's;Rest and Sleep;Leisure;Social  Participation    Body Structure / Function / Physical Skills  ADL;IADL    Cognitive Skills  Attention;Energy/Drive    Psychosocial Skills  Optometrist and Behaviors;Habits    Rehab Potential  Good    Clinical Decision Making  Several treatment options, min-mod task modification necessary    Comorbidities Affecting Occupational Performance:  May have comorbidities impacting occupational performance    Modification or Assistance to Complete Evaluation   Min-Moderate modification of tasks or assist with assess necessary to complete eval    OT Frequency  3x / week    OT Duration  4 weeks    OT Treatment/Interventions  Coping strategies training;Psychosocial skills training;Self-care/ADL training;Other (comment)   community reintegration   Consulted and Agree  with Plan of Care  Patient       Patient will benefit from skilled therapeutic intervention in order to improve the following deficits and impairments:   Body Structure / Function / Physical Skills: ADL, IADL Cognitive Skills: Attention, Energy/Drive Psychosocial Skills: Coping Strategies, Routines and Behaviors, Habits   Visit Diagnosis: 1. Bipolar 2 disorder, major depressive episode ([place])   2. Difficulty coping       Problem List Patient Active Problem List   Diagnosis Date Noted  . Dizziness 09/27/2018  . Polypharmacy 09/27/2018  . Severe recurrent major depression without psychotic features ([place]) 08/30/2018  . Bipolar 2 disorder, major depressive episode ([place]) 07/10/2018  . MDD (major depressive disorder), recurrent, severe, with psychosis ([place]) 07/10/2018  . CKD (chronic kidney disease), stage IV ([place]) 07/01/2018  . Transient Hypoglycemia 07/01/2018  . Chronic migraine 01/17/2017  . Diabetic peripheral neuropathy ([place]) 01/17/2017  . OSA (obstructive sleep apnea) 07/27/2016  . Wheezing 07/27/2016  . Hyperglycemia 12/25/2012  . Acute on chronic renal failure ([place]) 12/25/2012  . Pulmonary infiltrates  10/02/2012  . Postnasal drip 10/02/2012  . Hyperkalemia 09/25/2012  . Morbid obesity ([place]) 09/24/2012  . HTN (hypertension) 09/24/2012  . Bipolar II disorder ([place]) 09/24/2012  . URI (upper respiratory infection) 09/24/2012  . DKA, type 1 ([place]) 11/04/2011  . Gastroenteritis 11/03/2011  . DM (diabetes mellitus), type 1, uncontrolled ([place]) 11/03/2011  . Hyponatremia 11/03/2011  . Elevated lipase 11/03/2011  . Hypothyroidism 11/03/2011  . Tobacco abuse 11/03/2011  . SUI (stress urinary incontinence, female) 08/16/2011   Zenovia Jarred, MSOT, OTR/L Behavioral Health OT/ Acute Relief OT PHP Office: [place] 01/24/2019, 11:23 AM  [facility] HOSPITALIZATION PROGRAM [place], [place], [address] Phone: [phone]   Fax:  [phone]  Name: Jasmine [name] MRN: [id number] Date of Birth: 1970-06-09

## 2019-01-25 ENCOUNTER — Other Ambulatory Visit: Payer: Self-pay

## 2019-01-25 ENCOUNTER — Other Ambulatory Visit (HOSPITAL_COMMUNITY): Payer: BC Managed Care – PPO | Admitting: Licensed Clinical Social Worker

## 2019-01-25 ENCOUNTER — Other Ambulatory Visit (HOSPITAL_COMMUNITY): Payer: BC Managed Care – PPO

## 2019-01-25 ENCOUNTER — Ambulatory Visit (HOSPITAL_COMMUNITY): Payer: BC Managed Care – PPO | Admitting: Psychiatry

## 2019-01-25 DIAGNOSIS — F3181 Bipolar II disorder: Secondary | ICD-10-CM

## 2019-01-25 NOTE — Progress Notes (Addendum)
Pt attended spiritual care group 01/24/2019 11:00-12:00.  Group met via web-ex due to COVID-19 precautions.  Group facilitated by Simone Curia, MDiv, [place]   Group focused on topic of "community."  Members reflected on topic in facilitated dialog, identifying responses to topic and notions they hold of community from their previous experience.  Group members utilized value sort cards to identify top qualities they look for in community.  Engaged in facilitated dialog around their value choices, noting origin of these values, how these are realized in their lives, and strategies for engaging these values.    Spiritual care group drew on Motivational Interviewing, Narrative and Adlerian modalities.  Patient Progress: Jasmine [name] [facility]) was present through group.  She presented as somewhat withdrawn with flat affect.  Noted that her idea of community is rooted in [place] and family.  In completing the visual explorer activity, she identified "God's Will" as a value she holds dear in community.  Feels that her faith has "not been strong" and processed this with another group member who identified.     Jerene Pitch, MDiv, [facility]

## 2019-01-26 ENCOUNTER — Other Ambulatory Visit (HOSPITAL_COMMUNITY): Payer: BC Managed Care – PPO

## 2019-01-26 ENCOUNTER — Telehealth (HOSPITAL_COMMUNITY): Payer: Self-pay | Admitting: Professional

## 2019-01-26 ENCOUNTER — Other Ambulatory Visit: Payer: Self-pay

## 2019-01-29 ENCOUNTER — Other Ambulatory Visit (HOSPITAL_COMMUNITY): Payer: BC Managed Care – PPO

## 2019-01-29 ENCOUNTER — Ambulatory Visit (HOSPITAL_COMMUNITY): Payer: BC Managed Care – PPO | Admitting: Psychiatry

## 2019-01-30 ENCOUNTER — Other Ambulatory Visit: Payer: Self-pay

## 2019-01-30 ENCOUNTER — Other Ambulatory Visit (HOSPITAL_COMMUNITY): Payer: BC Managed Care – PPO | Admitting: Licensed Clinical Social Worker

## 2019-01-30 ENCOUNTER — Other Ambulatory Visit (INDEPENDENT_AMBULATORY_CARE_PROVIDER_SITE_OTHER): Payer: BC Managed Care – PPO | Admitting: Occupational Therapy

## 2019-01-30 DIAGNOSIS — F3181 Bipolar II disorder: Secondary | ICD-10-CM

## 2019-01-30 DIAGNOSIS — R4589 Other symptoms and signs involving emotional state: Secondary | ICD-10-CM

## 2019-01-30 NOTE — Progress Notes (Signed)
Spoke with patient via Webex video call, used 2 identifiers to correctly identify patient. Flat, depressed affect, states her weekend was "ok." The relationship with her boyfriend is not going well, he blocked her last night and then unblocked her today. He is very disrespectful to her and she knows its a toxic relationship that she needs to end. Her husband is unaware and doesn't want to work on their relationship because he doesn't see anything wrong. Has a hard time talking in a group setting and does better with individual therapy. Doesn't feel like she is getting much from groups. Has fleeting suicidal thoughts but no plan or intent. Continues to see people walking around. Does not hear voices. Did mention she had a friend tell her about 6 months ago that she has tremors, unknown if its a side effect from a medication. Seems to be more aware of it when she is doing a fine motor skill or when she is anxious. She had an MRI and nothing was found. On scale 1-10 as 10 being worst she rates depression at 6 and anxiety at 5. She would like to try Seroquel for anxiety and poor sleep. She is not sleeping well and is up and down all night long. She was on Seroquel in the past and it helped but she believes her dosage was too high because she was too sedated during the day. Email sent to NP to make her aware. No other issues or complaints.

## 2019-01-30 NOTE — Therapy (Signed)
[place], [place], [address] Phone: [phone]   Fax:  [phone]  Occupational Therapy Treatment  Patient Details  Name: Jasmine [name] MRN: [id number] Date of Birth: [birth year] Referring Provider (OT): Ricky Ala, NP   Encounter Date: 01/30/2019    Past Medical History:  Diagnosis Date  . Anxiety   . Balance problems   . Bipolar disorder ([place])   . Chronic fatigue   . CKD (chronic kidney disease), stage II   . Depression   . Diabetes mellitus   . DKA, type 1 ([place]) 11/04/2011  . Elevated cholesterol   . Fibromyalgia   . GERD (gastroesophageal reflux disease)   . Headache   . History of suicidal ideation   . Hyperlipemia   . Hypertension   . Hypothyroidism   . IBS (irritable bowel syndrome)   . Obesity   . Sleep apnea    HAS C -PAP / DOES NOT USE  . Stress incontinence    Pt had surgery to correct this.  . Tachycardia   . Tobacco abuse   . Tremor   . UTI (lower urinary tract infection)     Past Surgical History:  Procedure Laterality Date  . INCONTINENCE SURGERY    . NASAL FRACTURE SURGERY    . ovary removed    . OVARY SURGERY    . PUBOVAGINAL SLING  08/16/2011   Procedure: [name];  Surgeon: Bernestine Amass, MD;  Location: [facility];  Service: Urology;  Laterality: N/A;         . UTERINE FIBROID SURGERY  2001    There were no vitals filed for this visit.     Pt presented briefly for OT group after meeting with RN. She stayed in group not participating for about 10 minutes and signed off Webex without explanation. MSW notified for follow up.                     OT Short Term Goals - 01/24/19 1120      OT SHORT TERM GOAL #1   Title  Pt will be educated on strategies to improve psychosocial skills needed to participate fully in all daily, work, and leisure activities    Time  4    Period  Weeks    Status  New    Target Date  02/21/19       OT SHORT TERM GOAL #2   Title  Pt will apply psychosocial skills and coping mechanisms to daily activities in order to function independently and reintegrate into community    Time  4    Period  Weeks    Status  New      OT SHORT TERM GOAL #3   Title  Pt will recall and/or apply 1-3 sleep hygiene strategies to improve function in BADL routine upon reintegrating into community    Time  4    Period  Weeks    Status  New      OT SHORT TERM GOAL #4   Title  Pt will engage in goal setting to improve functional BADL/IADL routine upon reintegrating into community.    Time  4    Period  Weeks    Status  New                 Patient will benefit from skilled therapeutic intervention in order to improve the following deficits and impairments:  Visit Diagnosis: Bipolar 2 disorder, major depressive episode ([place])  Difficulty coping    Problem List Patient Active Problem List   Diagnosis Date Noted  . Dizziness 09/27/2018  . Polypharmacy 09/27/2018  . Severe recurrent major depression without psychotic features ([place]) 08/30/2018  . Bipolar 2 disorder, major depressive episode ([place]) 07/10/2018  . MDD (major depressive disorder), recurrent, severe, with psychosis ([place]) 07/10/2018  . CKD (chronic kidney disease), stage IV ([place]) 07/01/2018  . Transient Hypoglycemia 07/01/2018  . Chronic migraine 01/17/2017  . Diabetic peripheral neuropathy ([place]) 01/17/2017  . OSA (obstructive sleep apnea) 07/27/2016  . Wheezing 07/27/2016  . Hyperglycemia 12/25/2012  . Acute on chronic renal failure ([place]) 12/25/2012  . Pulmonary infiltrates 10/02/2012  . Postnasal drip 10/02/2012  . Hyperkalemia 09/25/2012  . Morbid obesity ([place]) 09/24/2012  . HTN (hypertension) 09/24/2012  . Bipolar II disorder ([place]) 09/24/2012  . URI (upper respiratory infection) 09/24/2012  . DKA, type 1 ([place]) 11/04/2011  . Gastroenteritis 11/03/2011  . DM (diabetes mellitus), type 1, uncontrolled  ([place]) 11/03/2011  . Hyponatremia 11/03/2011  . Elevated lipase 11/03/2011  . Hypothyroidism 11/03/2011  . Tobacco abuse 11/03/2011  . SUI (stress urinary incontinence, female) 08/16/2011    Zenovia Jarred 01/30/2019, 5:00 PM  [place], [place], [address] Phone: [phone]   Fax:  [phone]  Name: Jasmine [name] MRN: [id number] Date of Birth: [birth year]

## 2019-01-31 ENCOUNTER — Other Ambulatory Visit (HOSPITAL_COMMUNITY): Payer: BC Managed Care – PPO | Admitting: Licensed Clinical Social Worker

## 2019-01-31 ENCOUNTER — Encounter (HOSPITAL_COMMUNITY): Payer: Self-pay | Admitting: Family

## 2019-01-31 ENCOUNTER — Other Ambulatory Visit: Payer: Self-pay

## 2019-01-31 DIAGNOSIS — F3181 Bipolar II disorder: Secondary | ICD-10-CM

## 2019-01-31 MED ORDER — QUETIAPINE FUMARATE 25 MG PO TABS: 25.0000 mg | ORAL_TABLET | Freq: Two times a day (BID) | ORAL | 0 refills | Status: DC
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
## 2019-02-01 ENCOUNTER — Other Ambulatory Visit (HOSPITAL_COMMUNITY): Payer: BC Managed Care – PPO

## 2019-02-01 ENCOUNTER — Other Ambulatory Visit: Payer: Self-pay

## 2019-02-02 ENCOUNTER — Other Ambulatory Visit (HOSPITAL_COMMUNITY): Payer: BC Managed Care – PPO

## 2019-02-02 ENCOUNTER — Telehealth (HOSPITAL_COMMUNITY): Payer: Self-pay | Admitting: Professional

## 2019-02-02 ENCOUNTER — Other Ambulatory Visit: Payer: Self-pay

## 2019-02-04 NOTE — Psych (Addendum)
Virtual Visit via Video Note  I connected with Jasmine [name] on 01/22/19 at  9:00 AM EDT by a video enabled telemedicine application and verified that I am speaking with the correct person using two identifiers.   I discussed the limitations of evaluation and management by telemedicine and the availability of in person appointments. The patient expressed understanding and agreed to proceed.  I discussed the assessment and treatment plan with the patient. The patient was provided an opportunity to ask questions and all were answered. The patient agreed with the plan and demonstrated an understanding of the instructions.   The patient was advised to call back or seek an in-person evaluation if the symptoms worsen or if the condition fails to improve as anticipated.  Pt was provided 240 minutes of non-face-to-face time during this encounter.   Lorin Glass, LCSW    [facility] PHP THERAPIST PROGRESS NOTE  BERLINDA PARRILL [id number]  Session Time: 9:00 - 10:00  Participation Level: Active  Behavioral Response: CasualAlertDepressed  Type of Therapy: Group Therapy  Treatment Goals addressed: Coping  Interventions: CBT, DBT, Supportive and Reframing  Summary: Clinician led check-in regarding current stressors and situation, and review of patient completed daily inventory. Clinician utilized active listening and empathetic response and validated patient emotions. Clinician facilitated processing group on pertinent issues.   Therapist Response:  Jasmine [name] is a 48 y.o. female who presents with depression symptoms. Patient arrived within time allowed and reports that she is "not feeling well." Patient rates her mood at a 4 on a scale of 1-10 with 10 being great. Pt states she is having issues with her diabetes which is causing her to not feel well physically and she is feeling nervous about this being her first day of group. Pt states her weekend was "not very good" and that she  "argued" with her husband throughout. Pt states she is having "a lot" of issues at home due to her and her husband not getting along and frustrations regarding her dad living with them. Pt reports went to church this weekend which was good for her and she smiled. Pt minimally engaged in discussion.       Session Time: 10:00 -11:00  Participation Level: Active  Behavioral Response: CasualAlertDepressed  Type of Therapy: Group Therapy, psychoeducation, psychotherapy  Treatment Goals addressed: Coping  Interventions: CBT, DBT, Solution Focused, Supportive and Reframing  Summary:  Cln led discussion on ways in which we let other people's reactions affect [place]. Group shared things they do or do not do because of negative reactions from others. Cln offered ways to reframe our perceptions including boundary tenets, protective factors, and work arounds.    Therapist Response: Patient minimally participated in discussion and shares that she is susceptible to other people's reactions and often "caves" if they disagree or want something else.       Session Time: 11:00- 12:00  Participation Level: Active  Behavioral Response: CasualAlertDepressed  Type of Therapy: Group Therapy, Psychoeducation; Psychotherapy  Treatment Goals addressed: Coping  Interventions: CBT; DBT; Solution focused; Supportive; Reframing  Summary: Clinician continued topic of distress tolerance skills and introduced ACCEPTS skills. Group members discussed ways they could utilize the skills in their everyday life.   Therapist Response: Pt participated in discussion and reports understanding of ACCEPTS skills. Pt states she is most likely to use activities.       Session Time: 12:00 -1:00  Participation Level: Active  Behavioral Response: CasualAlertDepressed  Type of Therapy: Group Therapy, Psychoeducation;  Psychotherapy  Treatment Goals addressed: Coping  Interventions: CBT;  DBT; Solution focused; Supportive; Reframing  Summary:12:00 - 12:50:Cln continued topic of distress tolerance skills and led activity on planning ahead to increase ability to utilize skills. Group members completed different emotion cards, brainstormed all options of activities, and made lists of shows, books, music, and podcasts to listen to. Group shared how they would make these plans easily accessible.    12:50 -1:00 Clinician led check-out. Clinician assessed for immediate needs, medication compliance and efficacy, and safety concerns   Therapist Response: 12:00 - 12:50: Patient engaged in activity and identifies she will keep her lists in her notebook. 12:50 - 1:00: At check-out, patient rates her mood at a 6 on a scale of 1-10 with 10 being great. Patient reports afternoon plans of going to a doctor's appointment and maybe having dinner with a friend. Patient demonstrates some progress as evidenced by participating in first group session. Patient denies SI/HI/self-harm at the end of group.      Suicidal/Homicidal: Nowithout intent/plan  Plan: Pt will continue in PHP while working to decrease depression symptoms and increase ability to manage symptoms in a healthy manner.   Diagnosis: Bipolar 2 disorder, major depressive episode ([place]) [F31.81]    1. Bipolar 2 disorder, major depressive episode ([place])       Lorin Glass, LCSW 02/04/2019

## 2019-02-05 ENCOUNTER — Other Ambulatory Visit (HOSPITAL_COMMUNITY): Payer: BC Managed Care – PPO

## 2019-02-05 ENCOUNTER — Other Ambulatory Visit: Payer: Self-pay
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
## 2019-02-05 NOTE — Psych (Signed)
Virtual Visit via Video Note  I connected with Jasmine [name] on 01/23/19 at  9:00 AM EDT by a video enabled telemedicine application and verified that I am speaking with the correct person using two identifiers.   I discussed the limitations of evaluation and management by telemedicine and the availability of in person appointments. The patient expressed understanding and agreed to proceed.  I discussed the assessment and treatment plan with the patient. The patient was provided an opportunity to ask questions and all were answered. The patient agreed with the plan and demonstrated an understanding of the instructions.   The patient was advised to call back or seek an in-person evaluation if the symptoms worsen or if the condition fails to improve as anticipated.  Pt was provided 240 minutes of non-face-to-face time during this encounter.   Lorin Glass, LCSW    [facility] [place] PHP THERAPIST PROGRESS NOTE  Jasmine [name] [id number]  Session Time: 9:00 - 10:00  Participation Level: Active  Behavioral Response: CasualAlertDepressed  Type of Therapy: Group Therapy  Treatment Goals addressed: Coping  Interventions: CBT, DBT, Supportive and Reframing  Summary: Clinician led check-in regarding current stressors and situation, and review of patient completed daily inventory. Clinician utilized active listening and empathetic response and validated patient emotions. Clinician facilitated processing group on pertinent issues.   Therapist Response:  Jasmine [name] is a 48 y.o. female who presents with depression symptoms. Patient arrived within time allowed and reports that she "feels bad." Patient rates her mood at a 2 on a scale of 1-10 with 10 being great. Pt reports her evening was "bad" because she got into an argument with her friend and declined to share further details because she did not want anyone in the house to overhear. Pt states that her appointment went "ok" and that the  doctor made some adjustments that she is hoping make her feel better. Pt minimally engaged in discussion.       Session Time: 10:00 -11:00  Participation Level: Active  Behavioral Response: CasualAlertDepressed  Type of Therapy: Group Therapy, psychoeducation, psychotherapy  Treatment Goals addressed: Coping  Interventions: CBT, DBT, Solution Focused, Supportive and Reframing  Summary: Cln led discussion on ways to ground using distress tolerance skills. Cln discussed ways grounding can be useful for trauma symptoms.   Therapist Response: Patient minimally engaged in discussion.      Session Time: 11:00- 12:00  Participation Level: Active  Behavioral Response: CasualAlertDepressed  Type of Therapy: Group Therapy, Psychoeducation; Psychotherapy  Treatment Goals addressed: Coping  Interventions: CBT; Solution focused; Supportive; Reframing  Summary: Clinician continued topic of distress tolerance skills and introduced Self-Soothe skills. Group members discussed ways they could utilize the skills in their everyday life.      Therapist Response: Pt reports understanding of skill and states smelling candles as ways to practice the skill.       Session Time: 12:00 -1:00  Participation Level:Active  Behavioral Response:CasualAlertDepressed  Type of Therapy: Group Therapy, OT  Treatment Goals addressed: Coping  Interventions:Psychosocial skills training, Supportive,   Summary:12:00 - 12:50:Occupational Therapy group 12:50 -1:00 Clinician led check-out. Clinician assessed for immediate needs, medication compliance and efficacy, and safety concerns   Therapist Response: 12:00 - 12:50: Patient engaged in group. See OT note.  12:50 - 1:00: At check-out, patient rates her mood at a 4 on a scale of 1-10 with 10 being great. Pt states afternoon plans of taking a nap. Patient demonstrates some progress as evidenced by identifying  feelings.  Patient denies SI/HI/self-harm at the end of group.      Suicidal/Homicidal: Nowithout intent/plan  Plan: Pt will continue in PHP while working to decrease depression symptoms and increase ability to manage symptoms in a healthy manner.   Diagnosis: Bipolar 2 disorder, major depressive episode ([place]) [F31.81]    1. Bipolar 2 disorder, major depressive episode ([place])       Lorin Glass, LCSW 02/05/2019

## 2019-02-05 NOTE — Psych (Signed)
Virtual Visit via Video Note  I connected with Jasmine [name] on 01/30/19 at  9:00 AM EDT by a video enabled telemedicine application and verified that I am speaking with the correct person using two identifiers.   I discussed the limitations of evaluation and management by telemedicine and the availability of in person appointments. The patient expressed understanding and agreed to proceed.  I discussed the assessment and treatment plan with the patient. The patient was provided an opportunity to ask questions and all were answered. The patient agreed with the plan and demonstrated an understanding of the instructions.   The patient was advised to call back or seek an in-person evaluation if the symptoms worsen or if the condition fails to improve as anticipated.  Pt was provided 120 minutes of non-face-to-face time during this encounter.   Lorin Glass, LCSW    [facility] [place] PHP THERAPIST PROGRESS NOTE  Jasmine [name] [id number]  Session Time: 9:00 - 10:00  Participation Level: Active  Behavioral Response: CasualAlertDepressed  Type of Therapy: Group Therapy  Treatment Goals addressed: Coping  Interventions: CBT, DBT, Supportive and Reframing  Summary: Clinician led check-in regarding current stressors and situation, and review of patient completed daily inventory. Clinician utilized active listening and empathetic response and validated patient emotions. Clinician facilitated processing group on pertinent issues.   Therapist Response:  Jasmine [name] is a 48 y.o. female who presents with depression symptoms. Patient arrived within time allowed and reports that she feels "worried." Patient rates her mood at a 7 on a scale of 1-10 with 10 being great. Pt reports continued conflict with a partner that is causing her distress. Pt shares the weekend went okay "over all" and she is trying to turn her financial stress over to God and not worry about it. Pt shares continued health  issues re: her diabetes. Pt engaged in discussion.       Session Time: 10:00 -11:00  Participation Level: Active  Behavioral Response: CasualAlertDepressed  Type of Therapy: Group Therapy, psychoeducation, psychotherapy  Treatment Goals addressed: Coping  Interventions: CBT, DBT, Solution Focused, Supportive and Reframing  Summary: Cln led discussion on how to end unhealthy relationships when it is difficult. Group members shared barriers they experience in ending unhealthy dynamics and brainstormed ways to manage the barriers.    Therapist Response: Patient participated in discussion and shares she is in a "toxic" relationship with her partner and knows it is not good for her. Pt states she feels the need to walk on egg shells because when he becomes annoyed or upset with her he will block her from communication for a few days until he unblocks her and acts like it never happened. Pt states this is a weekly cycle. Pt reports knowing it is not healthy and struggling to end it anyway. Pt participates in discussion and at end of group states she stopped listening at some point because she does not expect to actually end things with her partner.   ** Pt abruptly chose to leave group at 11 citing feeling poor physically.         Suicidal/Homicidal: Nowithout intent/plan  Plan: Pt will continue in PHP while working to decrease depression symptoms and increase ability to manage symptoms in a healthy manner.   Diagnosis: Bipolar 2 disorder, major depressive episode ([place]) [F31.81]    1. Bipolar 2 disorder, major depressive episode ([place])       Lorin Glass, LCSW 02/05/2019

## 2019-02-05 NOTE — Psych (Signed)
Virtual Visit via Video Note  I connected with Jasmine [name] on 01/25/19 at  9:00 AM EDT by a video enabled telemedicine application and verified that I am speaking with the correct person using two identifiers.   I discussed the limitations of evaluation and management by telemedicine and the availability of in person appointments. The patient expressed understanding and agreed to proceed.  I discussed the assessment and treatment plan with the patient. The patient was provided an opportunity to ask questions and all were answered. The patient agreed with the plan and demonstrated an understanding of the instructions.   The patient was advised to call back or seek an in-person evaluation if the symptoms worsen or if the condition fails to improve as anticipated.  Pt was provided 105 minutes of non-face-to-face time during this encounter.   Lorin Glass, LCSW    [facility] [place] PHP THERAPIST PROGRESS NOTE  Jasmine [name] [id number]  Session Time: 9:00 - 10:00  Participation Level: Active  Behavioral Response: CasualAlertDepressed  Type of Therapy: Group Therapy  Treatment Goals addressed: Coping  Interventions: CBT, DBT, Supportive and Reframing  Summary: Clinician led check-in regarding current stressors and situation, and review of patient completed daily inventory. Clinician utilized active listening and empathetic response and validated patient emotions. Clinician facilitated processing group on pertinent issues.   Therapist Response:  Jasmine [name] is a 48 y.o. female who presents with depression symptoms. Patient arrived within time allowed and reports that she feels "good." Patient rates her mood at a 7 on a scale of 1-10 with 10 being great. Pt reports she isn't feeling well physically, but her mood is better than it has been. Pt states she didn't wake up low this morning and had a fun dinner with a friend last night.  Pt engaged in discussion.    Session Time:  10:00-11:00  Participation Level:Active  Behavioral Response:CasualAlertDepressed  Type of Therapy: Group Therapy, psychoeducation, psychotherapy  Treatment Goals addressed: Coping  Interventions:CBT, DBT, Solution Focused, Supportive and Reframing  Summary:Cln led discussion on how to discuss absences or altered behaviors due to mental health. Group shared concerns regarding returning to work or answering the question "what have you been up to" as stressors. Cln processed with pt's and brainstormed with group different ways to handle these stressors as they arise.   Therapist Response: Patient participated in discussion andreports feeling uncomfortable anytime she has to discuss her mental health with anyone she is not close to.   **Pt chose to leave group at 10:45 reporting she was struggling with IBS issues.      Suicidal/Homicidal: Nowithout intent/plan  Plan: Pt will continue in PHP while working to decrease depression symptoms and increase ability to manage symptoms in a healthy manner.   Diagnosis: Bipolar 2 disorder, major depressive episode ([place]) [F31.81]    1. Bipolar 2 disorder, major depressive episode ([place])       Lorin Glass, LCSW 02/05/2019

## 2019-02-05 NOTE — Psych (Signed)
Virtual Visit via Video Note  I connected with Jasmine [name] on 01/24/19 at  9:00 AM EDT by a video enabled telemedicine application and verified that I am speaking with the correct person using two identifiers.   I discussed the limitations of evaluation and management by telemedicine and the availability of in person appointments. The Jasmine [name] expressed understanding and agreed to proceed.  I discussed the assessment and treatment plan with the Jasmine [name]. The Jasmine [name] was provided an opportunity to ask questions and all were answered. The Jasmine [name] agreed with the plan and demonstrated an understanding of the instructions.   The Jasmine [name] was advised to call back or seek an in-person evaluation if the symptoms worsen or if the condition fails to improve as anticipated.  Pt was provided 240 minutes of non-face-to-face time during this encounter.   Lorin Glass, LCSW    [facility] [place] PHP THERAPIST PROGRESS NOTE  BRITTHANY WOLAVER [id number]  Session Time: 9:00 - 10:00  Participation Level: Active  Behavioral Response: CasualAlertDepressed  Type of Therapy: Group Therapy  Treatment Goals addressed: Coping  Interventions: CBT, DBT, Supportive and Reframing  Summary: Clinician led check-in regarding current stressors and situation, and review of Jasmine [name] completed daily inventory. Clinician utilized active listening and empathetic response and validated Jasmine [name] emotions. Clinician facilitated processing group on pertinent issues.   Therapist Response:  Jasmine [name] is a 48 y.o. female who presents with depression symptoms. Jasmine [name] arrived within time allowed and reports that she feels "okay." Jasmine [name] rates her mood at a 7 on a scale of 1-10 with 10 being great. Pt reports she "solved a problem" yesterday by ending a friendship and feels positive about it. Pt states she took a long nap and slept well at night which has also improved her mood. Pt engaged in discussion.       Session  Time: 10:00-11:00  Participation Level:Active  Behavioral Response:CasualAlertDepressed  Type of Therapy: Group Therapy, psychoeducation, psychotherapy  Treatment Goals addressed: Coping  Interventions:CBT, DBT, Solution Focused, Supportive and Reframing  Summary:Cln led discussion on increasing motivation. Cln challenged pt's to reframe the way they view motivation and seek to adjust expectations and offer themselves grace versus looking for tips to "force" themselves to do something. Cln encouraged pt's to think about "need" and "want" seriously as a way to decrease judgment for what they are achieving.    Therapist Response: Jasmine [name] participated in discussion and shares struggles with being productive and states she judges herself about it. Pt reports willingness to try to adjust her expectations.       Session Time: 11:00 -12:15  Participation Level: Active  Behavioral Response: CasualAlertDepressed  Type of Therapy: Group Therapy, psychotherapy  Treatment Goals addressed: Coping  Interventions: Strengths based, reframing, Supportive,   Summary:  Spiritual Care group  Therapist Response: Jasmine [name] engaged in group. See chaplain note.        Session Time: 12:00- 1:00  Participation Level: Active  Behavioral Response: CasualAlertDepressed  Type of Therapy: Group Therapy, Psychoeducation  Treatment Goals addressed: Coping  Interventions: relaxation training; Supportive; Reframing  Summary: 12:00 - 12:50: Relaxation group: Cln led group focused on retraining the body's response to stress.   12:50 -1:00 Clinician led check-out. Clinician assessed for immediate needs, medication compliance and efficacy, and safety concerns   Therapist Response: Jasmine [name] engaged in activity and discussion. At [place], Jasmine [name] rates her mood at a 7 on a scale of 1-10 with 10 being great. Jasmine [name] reports afternoon plans of taking a nap. Jasmine [name]  demonstrates some progress as evidenced by improved mood. Jasmine [name] denies SI/HI/self-harm thoughts at the end of group.       Suicidal/Homicidal: Nowithout intent/plan  Plan: Pt will continue in PHP while working to decrease depression symptoms and increase ability to manage symptoms in a healthy manner.   Diagnosis: Bipolar 2 disorder, major depressive episode ([place]) [F31.81]    1. Bipolar 2 disorder, major depressive episode ([place])       Lorin Glass, LCSW 02/05/2019

## 2019-02-06 ENCOUNTER — Other Ambulatory Visit (HOSPITAL_COMMUNITY): Payer: BC Managed Care – PPO

## 2019-02-07 ENCOUNTER — Other Ambulatory Visit (HOSPITAL_COMMUNITY): Payer: BC Managed Care – PPO

## 2019-02-08 ENCOUNTER — Ambulatory Visit (HOSPITAL_COMMUNITY): Payer: BC Managed Care – PPO

## 2019-02-08 ENCOUNTER — Telehealth (HOSPITAL_COMMUNITY): Payer: Self-pay | Admitting: *Deleted

## 2019-02-08 ENCOUNTER — Other Ambulatory Visit (HOSPITAL_COMMUNITY): Payer: BC Managed Care – PPO

## 2019-02-08 NOTE — Telephone Encounter (Signed)
Dr. Alford Highland Schneidmiller  Called back  Still would like to speak with you # [phone]. stated the following concerns: Doesn't feel like she is meeting patient needs out patient on line.  Stated patient is having manic behavior, not sleeping, sexual behaviors, feels no need to sleep & 1st time in many years patient not depressed.  Although mentioned to me when patient called. Patient has accepted the 02/14/2019 appt that I was holding for her.

## 2019-02-08 NOTE — Telephone Encounter (Signed)
VOICE MESSAGE LEFT BY Dr. Alford Highland Schneidmiller concerning patient of Dr Fonnie Jarvis kemp 08/28/2070. meassge requesting to speak with provider about patient  & needing a more higher level of care & medication changes.  I ATTEMPTED 2 CALLS BACK TO THE DR. 1ST CALL LOTS OF BACKGROUND  & FUMBLING OF PHONE  & THEN DISCONNECTED. 2ND CALL IMMEDIATELY AFTER & SPOKE WITH DR TO INFORM THAT PROVIDER IS OUT OF OFFICE TODAY & TO GET MORE INFORMATION ABOUT SITUATION & ONCE AGAIN PHONE DISCONNECTED.  & CALL BY PATIENT stating she was told to call to get her med's increased & that she was at a worsening state of depression & that she needs to talk with the doctor. I was trying to make aware provider is out of office today  & that I would need to check schedule for 1st available. "She replied well what am I suppose to do?" Asked if she had a plan? AND to give Emergency resources which includes 911, ED, suicide crisis line [phone]). "Patient stated she don't have time for this SHIT & then phone disconnects". Attempted call back to inform that a appointment is available for  02/14/2019 @ 4:20 pm. Call went straight to VM did LVM. Will Hold Appt in hopes patient responds

## 2019-02-08 NOTE — Telephone Encounter (Signed)
I think you've done all you can do. Also, patient can go directly to the [facility] in [place] for walk in assessment

## 2019-02-09 ENCOUNTER — Ambulatory Visit (HOSPITAL_COMMUNITY): Payer: BC Managed Care – PPO

## 2019-02-09 ENCOUNTER — Other Ambulatory Visit (HOSPITAL_COMMUNITY): Payer: BC Managed Care – PPO

## 2019-02-13 ENCOUNTER — Other Ambulatory Visit (HOSPITAL_COMMUNITY): Payer: BC Managed Care – PPO

## 2019-02-13 ENCOUNTER — Ambulatory Visit (HOSPITAL_COMMUNITY): Payer: BC Managed Care – PPO

## 2019-02-13 NOTE — Telephone Encounter (Signed)
Called Dr. Raynald Kemp. She will fax the consent from to our office. Dr. Raynald Kemp is concerned that the patient may be displaying more of manic symptoms, although she denies any safety concern (last seen last Thursday). The patient dropped out of group therapy, and Dr. Chauncey Cruel wonders if she may benefit from more structured treatment. Dr. Chauncey Cruel contacted psychosocial rehab at [facility], although the stop will be taken by patient with Medicaid. Dr. Chauncey Cruel will see the patient this Thursday.

## 2019-02-14 ENCOUNTER — Ambulatory Visit (INDEPENDENT_AMBULATORY_CARE_PROVIDER_SITE_OTHER): Payer: BC Managed Care – PPO | Admitting: Psychiatry

## 2019-02-14 ENCOUNTER — Other Ambulatory Visit: Payer: Self-pay

## 2019-02-14 ENCOUNTER — Encounter (HOSPITAL_COMMUNITY): Payer: Self-pay | Admitting: Psychiatry

## 2019-02-14 ENCOUNTER — Other Ambulatory Visit (HOSPITAL_COMMUNITY): Payer: BC Managed Care – PPO

## 2019-02-14 DIAGNOSIS — F3181 Bipolar II disorder: Secondary | ICD-10-CM | POA: Diagnosis not present

## 2019-02-14 MED ORDER — LAMOTRIGINE 200 MG PO TABS: 200.0000 mg | ORAL_TABLET | Freq: Every day | ORAL | 0 refills | Status: DC

## 2019-02-14 MED ORDER — LORAZEPAM 0.5 MG PO TABS: 0.5000 mg | ORAL_TABLET | Freq: Two times a day (BID) | ORAL | 0 refills | Status: DC | PRN

## 2019-02-14 MED ORDER — BUPROPION HCL ER (XL) 300 MG PO TB24: 300.0000 mg | ORAL_TABLET | Freq: Every day | ORAL | 0 refills | Status: DC

## 2019-02-14 NOTE — Patient Instructions (Signed)
1. Continue lamotrigine 200 mg daily  2.Continue duloxetine 60 mgdaily 3.Continuebupropion300 mg daily 4. Start quetiapine 25 mg at night. If it makes you drowsy, try 12.5 mg at night 5. Please ContactECTfor appointment [phone] 6. Will make referral to [facility]  [phone] 7. Continuelorazepam 0.5 mg twice a day as needed for anxiety 8. Next appointment:10/8 at 2 PM
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
## 2019-02-15 ENCOUNTER — Encounter: Payer: Self-pay | Admitting: Neurology

## 2019-02-15 ENCOUNTER — Ambulatory Visit (HOSPITAL_COMMUNITY): Payer: BC Managed Care – PPO

## 2019-02-15 ENCOUNTER — Telehealth (HOSPITAL_COMMUNITY): Payer: Self-pay | Admitting: Professional

## 2019-02-15 ENCOUNTER — Other Ambulatory Visit: Payer: Self-pay

## 2019-02-15 ENCOUNTER — Ambulatory Visit (INDEPENDENT_AMBULATORY_CARE_PROVIDER_SITE_OTHER): Payer: BC Managed Care – PPO | Admitting: Neurology

## 2019-02-15 VITALS — BP 138/78 | HR 71 | Temp 98.0°F | Ht 69.5 in | Wt 300.0 lb

## 2019-02-15 DIAGNOSIS — G43709 Chronic migraine without aura, not intractable, without status migrainosus: Secondary | ICD-10-CM

## 2019-02-15 DIAGNOSIS — G6289 Other specified polyneuropathies: Secondary | ICD-10-CM

## 2019-02-15 DIAGNOSIS — IMO0002 Reserved for concepts with insufficient information to code with codable children: Secondary | ICD-10-CM

## 2019-02-15 DIAGNOSIS — G629 Polyneuropathy, unspecified: Secondary | ICD-10-CM | POA: Insufficient documentation
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
## 2019-02-15 NOTE — Telephone Encounter (Signed)
Cln returned pt call. Pt reports she wants to try again. Cln discusses issues with pt staying for complete sessions. Pt reports she has 3 dr appts coming up but could stay for every other day. PW will be emailed. Cln offers tomorrow as start date; pt declines and asks to start on Monday, 9/14. Cln agrees.

## 2019-02-16 ENCOUNTER — Ambulatory Visit (HOSPITAL_COMMUNITY): Payer: BC Managed Care – PPO

## 2019-02-16 ENCOUNTER — Other Ambulatory Visit (HOSPITAL_COMMUNITY): Payer: BC Managed Care – PPO

## 2019-02-19 ENCOUNTER — Other Ambulatory Visit (HOSPITAL_COMMUNITY): Payer: BC Managed Care – PPO | Attending: Psychiatry | Admitting: Licensed Clinical Social Worker

## 2019-02-19 ENCOUNTER — Telehealth (HOSPITAL_COMMUNITY): Payer: Self-pay | Admitting: Professional

## 2019-02-19 ENCOUNTER — Other Ambulatory Visit: Payer: Self-pay

## 2019-02-19 DIAGNOSIS — E1022 Type 1 diabetes mellitus with diabetic chronic kidney disease: Secondary | ICD-10-CM | POA: Insufficient documentation

## 2019-02-19 DIAGNOSIS — N182 Chronic kidney disease, stage 2 (mild): Secondary | ICD-10-CM | POA: Insufficient documentation

## 2019-02-19 DIAGNOSIS — E785 Hyperlipidemia, unspecified: Secondary | ICD-10-CM | POA: Insufficient documentation

## 2019-02-19 DIAGNOSIS — Z87891 Personal history of nicotine dependence: Secondary | ICD-10-CM | POA: Insufficient documentation

## 2019-02-19 DIAGNOSIS — G4730 Sleep apnea, unspecified: Secondary | ICD-10-CM | POA: Insufficient documentation

## 2019-02-19 DIAGNOSIS — E039 Hypothyroidism, unspecified: Secondary | ICD-10-CM | POA: Insufficient documentation

## 2019-02-19 DIAGNOSIS — E7800 Pure hypercholesterolemia, unspecified: Secondary | ICD-10-CM | POA: Insufficient documentation

## 2019-02-19 DIAGNOSIS — M797 Fibromyalgia: Secondary | ICD-10-CM | POA: Insufficient documentation

## 2019-02-19 DIAGNOSIS — K589 Irritable bowel syndrome without diarrhea: Secondary | ICD-10-CM | POA: Insufficient documentation

## 2019-02-19 DIAGNOSIS — Z7982 Long term (current) use of aspirin: Secondary | ICD-10-CM | POA: Insufficient documentation

## 2019-02-19 DIAGNOSIS — Z818 Family history of other mental and behavioral disorders: Secondary | ICD-10-CM | POA: Insufficient documentation

## 2019-02-19 DIAGNOSIS — F419 Anxiety disorder, unspecified: Secondary | ICD-10-CM | POA: Insufficient documentation

## 2019-02-19 DIAGNOSIS — I129 Hypertensive chronic kidney disease with stage 1 through stage 4 chronic kidney disease, or unspecified chronic kidney disease: Secondary | ICD-10-CM | POA: Insufficient documentation

## 2019-02-19 DIAGNOSIS — K219 Gastro-esophageal reflux disease without esophagitis: Secondary | ICD-10-CM | POA: Insufficient documentation

## 2019-02-19 DIAGNOSIS — R4589 Other symptoms and signs involving emotional state: Secondary | ICD-10-CM | POA: Insufficient documentation

## 2019-02-19 DIAGNOSIS — Z794 Long term (current) use of insulin: Secondary | ICD-10-CM | POA: Insufficient documentation

## 2019-02-19 DIAGNOSIS — F3181 Bipolar II disorder: Secondary | ICD-10-CM | POA: Insufficient documentation

## 2019-02-19 NOTE — Psych (Signed)
Virtual Visit via Video Note  I connected with Jasmine [name] on 02/19/19 at  9:00 AM EDT by a video enabled telemedicine application and verified that I am speaking with the correct person using two identifiers.   I discussed the limitations of evaluation and management by telemedicine and the availability of in person appointments. The patient expressed understanding and agreed to proceed.  I discussed the assessment and treatment plan with the patient. The patient was provided an opportunity to ask questions and all were answered. The patient agreed with the plan and demonstrated an understanding of the instructions.   The patient was advised to call back or seek an in-person evaluation if the symptoms worsen or if the condition fails to improve as anticipated.  Pt was provided 120 minutes of non-face-to-face time during this encounter.   Lorin Glass, LCSW    [facility] PHP THERAPIST PROGRESS NOTE  Jasmine [name] [id number]  Session Time: 9:00 - 10:00  Participation Level: Minimal  Behavioral Response: CasualAlertDepressed  Type of Therapy: Group Therapy  Treatment Goals addressed: Coping  Interventions: CBT, DBT, Supportive and Reframing  Summary: Clinician led check-in regarding current stressors and situation, and review of patient completed daily inventory. Clinician utilized active listening and empathetic response and validated patient emotions. Clinician facilitated processing group on pertinent issues.   Therapist Response:  Jasmine [name] is a 48 y.o. female who presents with depression symptoms. Patient arrived within time allowed and reports that she feels "in the middle." Patient rates her mood at a 5 on a scale of 1-10 with 10 being great. Pt reports her weekend was "boring as usual." Pt states she stayed in and cleaned the house and watched tv and played on her phone. Pt states her blood sugar continues to cause her issues and disrupt her sleep. Pt  minimally engaged in discussion.       Session Time: 10:00 -11:00  Participation Level: Did not participate  Behavioral Response: CasualAlertDepressed  Type of Therapy: Group Therapy, psychoeducation, psychotherapy  Treatment Goals addressed: Coping  Interventions: CBT, DBT, Solution Focused, Supportive and Reframing  Summary: Cln led discussion on struggling with using skills when escalated. Group members shared barriers they experience in utilizing skills. Cln encouraged pt's to gain awareness around triggers and consider ways to shift perspective.   Therapist Response: Pt states she does not have any barriers to implementing skills. Patient did not participate in discussion.    **Pt left group at 11 without notice. Cln W. Phoebe Sharps called to follow up and was unable to connect with pt over the phone.       Suicidal/Homicidal: Nowithout intent/plan  Plan: Pt will continue in PHP while working to decrease depression symptoms and increase ability to manage symptoms in a healthy manner.   Diagnosis: Bipolar 2 disorder, major depressive episode ([place]) [F31.81]    1. Bipolar 2 disorder, major depressive episode ([place])       Lorin Glass, LCSW 02/19/2019

## 2019-02-20 ENCOUNTER — Other Ambulatory Visit: Payer: Self-pay

## 2019-02-20 ENCOUNTER — Encounter (HOSPITAL_COMMUNITY): Payer: Self-pay

## 2019-02-20 ENCOUNTER — Other Ambulatory Visit (HOSPITAL_COMMUNITY): Payer: BC Managed Care – PPO | Admitting: Licensed Clinical Social Worker

## 2019-02-20 ENCOUNTER — Other Ambulatory Visit (HOSPITAL_COMMUNITY): Payer: BC Managed Care – PPO | Admitting: Occupational Therapy

## 2019-02-20 DIAGNOSIS — K219 Gastro-esophageal reflux disease without esophagitis: Secondary | ICD-10-CM | POA: Diagnosis not present

## 2019-02-20 DIAGNOSIS — Z794 Long term (current) use of insulin: Secondary | ICD-10-CM | POA: Diagnosis not present

## 2019-02-20 DIAGNOSIS — I129 Hypertensive chronic kidney disease with stage 1 through stage 4 chronic kidney disease, or unspecified chronic kidney disease: Secondary | ICD-10-CM | POA: Diagnosis not present

## 2019-02-20 DIAGNOSIS — F3181 Bipolar II disorder: Secondary | ICD-10-CM

## 2019-02-20 DIAGNOSIS — E7800 Pure hypercholesterolemia, unspecified: Secondary | ICD-10-CM | POA: Diagnosis not present

## 2019-02-20 DIAGNOSIS — R4589 Other symptoms and signs involving emotional state: Secondary | ICD-10-CM

## 2019-02-20 DIAGNOSIS — Z7982 Long term (current) use of aspirin: Secondary | ICD-10-CM | POA: Diagnosis not present

## 2019-02-20 DIAGNOSIS — E039 Hypothyroidism, unspecified: Secondary | ICD-10-CM | POA: Diagnosis not present

## 2019-02-20 DIAGNOSIS — M797 Fibromyalgia: Secondary | ICD-10-CM | POA: Diagnosis not present

## 2019-02-20 DIAGNOSIS — N182 Chronic kidney disease, stage 2 (mild): Secondary | ICD-10-CM | POA: Diagnosis not present

## 2019-02-20 DIAGNOSIS — Z87891 Personal history of nicotine dependence: Secondary | ICD-10-CM | POA: Diagnosis not present

## 2019-02-20 DIAGNOSIS — E1022 Type 1 diabetes mellitus with diabetic chronic kidney disease: Secondary | ICD-10-CM | POA: Diagnosis not present

## 2019-02-20 DIAGNOSIS — Z818 Family history of other mental and behavioral disorders: Secondary | ICD-10-CM | POA: Diagnosis not present

## 2019-02-20 DIAGNOSIS — G4730 Sleep apnea, unspecified: Secondary | ICD-10-CM | POA: Diagnosis not present

## 2019-02-20 DIAGNOSIS — F419 Anxiety disorder, unspecified: Secondary | ICD-10-CM | POA: Diagnosis not present

## 2019-02-20 DIAGNOSIS — K589 Irritable bowel syndrome without diarrhea: Secondary | ICD-10-CM | POA: Diagnosis not present

## 2019-02-20 DIAGNOSIS — E785 Hyperlipidemia, unspecified: Secondary | ICD-10-CM | POA: Diagnosis not present

## 2019-02-20 NOTE — Progress Notes (Signed)
Spoke with patient via Webex video call, used 2 identifiers to correctly identify patient. She is a returning patient that states she stopped coming to groups because she didn't feel it was helping her. She returned yesterday. She does not feel comfortable talking in a group setting virtually. She decided to come back and try again. She has been taking notes but does not seem hopeful. Flat, depressed affect. On scale of 1-10 as 10 being worst she rates depression at 7 and anxiety at 7. Denies SI/HI or AV hallucinations. PHQ9=22. She still has the boyfriend and her relationship with her husband has not changed for the better. No side effects from medications. States she is taking a medication CoQ10 every night but unsure of dosage. No issues or complaints. She is hoping to continue with groups and gain some insight/help with her depression and anxiety.

## 2019-02-20 NOTE — Psych (Signed)
Virtual Visit via Video Note  I connected with Jasmine [name] on 02/20/19 at  9:00 AM EDT by a video enabled telemedicine application and verified that I am speaking with the correct person using two identifiers.   I discussed the limitations of evaluation and management by telemedicine and the availability of in person appointments. The patient expressed understanding and agreed to proceed.  I discussed the assessment and treatment plan with the patient. The patient was provided an opportunity to ask questions and all were answered. The patient agreed with the plan and demonstrated an understanding of the instructions.   The patient was advised to call back or seek an in-person evaluation if the symptoms worsen or if the condition fails to improve as anticipated.  Pt was provided 240 minutes of non-face-to-face time during this encounter.   Lorin Glass, LCSW    [facility] PHP THERAPIST PROGRESS NOTE  Jasmine [name] [id number]  Session Time: 9:00 - 10:00  Participation Level: Minimal  Behavioral Response: CasualAlertDepressed  Type of Therapy: Group Therapy  Treatment Goals addressed: Coping  Interventions: CBT, DBT, Supportive and Reframing  Summary: Clinician led check-in regarding current stressors and situation, and review of patient completed daily inventory. Clinician utilized active listening and empathetic response and validated patient emotions. Clinician facilitated processing group on pertinent issues.   Therapist Response:  Jasmine [name] is a 48 y.o. female who presents with depression symptoms. Patient arrived within time allowed and reports that she feels "still in the middle." Patient rates her mood at a 5 on a scale of 1-10 with 10 being great. Pt reports she fell asleep during group yesterday and that is why she left. Pt states she slept through group and her physical therapy appointment. Pt reports she woke up to eat and watch tv then went back to bed. Pt  shares she found out she is getting a new insulin pump which relieves a stressor for her. Pt minimally engaged in discussion.       Session Time: 10:00 -11:00  Participation Level: Minimal  Behavioral Response: CasualAlertDepressed  Type of Therapy: Group Therapy, psychoeducation, psychotherapy  Treatment Goals addressed: Coping  Interventions: CBT, DBT, Solution Focused, Supportive and Reframing  Summary: Cln led discussion on recognizing the positive. Group members shared how it feels like nothing positive is happening. Cln offered challenges and brought in topics of mindfulness, irrational thought patterns, and reframing. Cln encouraged pt's to try gratitude activity over the next month as a way to retrain the brain to equalize what it notices in terms of positive and negative.   Therapist Response: Pt reports struggling with concentration and paying attention in group. Pt states this has been an issue for approximately 6 months and he doctor are aware. Pt reports openness to trying mindfulness practices as way to strengthen her focus.        Session Time: 11:00- 12:00  Participation Level: Minimal  Behavioral Response: CasualAlertDepressed  Type of Therapy: Group Therapy, Psychoeducation; Psychotherapy  Treatment Goals addressed: Coping  Interventions: CBT; Solution focused; Supportive; Reframing  Summary: Clinician introduced topic of boundaries. Cln discussed what boundaries are, why we talk about them, and provided education on the 3 boundary styles and types of boundaries. Group members discussed how they feel their boundaries are currently.    Therapist Response: Pt reports understanding of topic discussed. Pt identifies she has mainly porous boundaries.      Session Time: 12:00 -1:00  Participation Level:Active  Behavioral Response:CasualAlertDepressed  Type of Therapy:  Group Therapy, OT  Treatment Goals addressed:  Coping  Interventions:Psychosocial skills training, Supportive,   Summary:12:00 - 12:50:Occupational Therapy group 12:50 -1:00 Clinician led check-out. Clinician assessed for immediate needs, medication compliance and efficacy, and safety concerns   Therapist Response: 12:00 - 12:50: Patient engaged in group. See OT note.  12:50 - 1:00: At check-out, patient rates her mood at a 7 on a scale of 1-10 with 10 being great. Pt states afternoon plans of helping her nephew get to work and making dinner. Patient demonstrates some progress as evidenced by participating in complete session. Patient denies SI/HI/self-harm at the end of group.      Suicidal/Homicidal: Nowithout intent/plan  Plan: Pt will continue in PHP while working to decrease depression symptoms and increase ability to manage symptoms in a healthy manner.   Diagnosis: Bipolar 2 disorder, major depressive episode ([place]) [F31.81]    1. Bipolar 2 disorder, major depressive episode ([place])       Lorin Glass, LCSW 02/20/2019

## 2019-02-21 ENCOUNTER — Other Ambulatory Visit: Payer: Self-pay

## 2019-02-21 ENCOUNTER — Other Ambulatory Visit (HOSPITAL_COMMUNITY): Payer: BC Managed Care – PPO | Admitting: Licensed Clinical Social Worker

## 2019-02-21 ENCOUNTER — Encounter (HOSPITAL_COMMUNITY): Payer: Self-pay

## 2019-02-21 DIAGNOSIS — F3181 Bipolar II disorder: Secondary | ICD-10-CM

## 2019-02-21 NOTE — Therapy (Signed)
[facility] [place], [place], [address] Phone: [phone]   Fax:  [phone]  Occupational Therapy Treatment  Patient Details  Name: Jasmine [name] MRN: [id number] Date of Birth: [birth year] Referring Provider (OT): Ricky Ala, NP  Virtual Visit via Video Note  I connected with Jasmine [name] on 02/21/19 at  8:00 AM EDT by a video enabled telemedicine application and verified that I am speaking with the correct person using two identifiers.   I discussed the limitations of evaluation and management by telemedicine and the availability of in person appointments. The patient expressed understanding and agreed to proceed.   I discussed the assessment and treatment plan with the patient. The patient was provided an opportunity to ask questions and all were answered. The patient agreed with the plan and demonstrated an understanding of the instructions.   The patient was advised to call back or seek an in-person evaluation if the symptoms worsen or if the condition fails to improve as anticipated.  I provided 60 minutes of non-face-to-face time during this encounter.   Zenovia Jarred, OT    Encounter Date: 02/20/2019  OT End of Session - 02/21/19 1139    Visit Number  1    Number of Visits  12    Date for OT Re-Evaluation  03/21/19    Authorization Type  BCBS    OT Start Time  1100    OT Stop Time  1230    OT Time Calculation (min)  90 min    Activity Tolerance  Patient tolerated treatment well    Behavior During Therapy  WFL for tasks assessed/performed       Past Medical History:  Diagnosis Date  . Anxiety   . Balance problems   . Bipolar disorder ([place])   . Chronic fatigue   . CKD (chronic kidney disease), stage II   . Depression   . Diabetes mellitus   . DKA, type 1 ([place]) 11/04/2011  . Elevated cholesterol   . Fibromyalgia   . GERD (gastroesophageal reflux disease)   . Headache   .  History of suicidal ideation   . Hyperlipemia   . Hypertension   . Hypothyroidism   . IBS (irritable bowel syndrome)   . Obesity   . Sleep apnea    HAS C -PAP / DOES NOT USE  . Stress incontinence    Pt had surgery to correct this.  . Tachycardia   . Tobacco abuse   . Tremor   . UTI (lower urinary tract infection)     Past Surgical History:  Procedure Laterality Date  . INCONTINENCE SURGERY    . NASAL FRACTURE SURGERY    . ovary removed    . OVARY SURGERY    . PUBOVAGINAL SLING  08/16/2011   Procedure: [name];  Surgeon: Bernestine Amass, MD;  Location: [facility];  Service: Urology;  Laterality: N/A;         . UTERINE FIBROID SURGERY  2001    There were no vitals filed for this visit.  Subjective Assessment - 02/21/19 1136    Currently in Pain?  Other (Comment)   chronic pain        [facility] OT Assessment - 02/21/19 0001      Assessment   Medical Diagnosis  Bipolar 2 disorder, major depressive disorder    Referring Provider (OT)  Ricky Ala, NP    Onset Date/Surgical Date  02/21/19  Precautions   Precautions  None      Restrictions   Weight Bearing Restrictions  No      Balance Screen   Has the patient fallen in the past 6 months  No    Has the patient had a decrease in activity level because of a fear of falling?   No    Is the patient reluctant to leave their home because of a fear of falling?   No       OT assessment: OCAIRS  Diagnosis: Bipolar 2 disorder, MDD  Past medical history/referral information: Pt with recent admission to [facility], did not return after one OT visit. Returned from recommendation of psychiatrist for continued worsening MH symptoms  Living situation: Pt lives with spouse  ADLs/IADLs: Decreased engagement  Work: Unable to work at this time  Leisure: decreased engagement  Social support: decreased engagement  Engineer, building services of Client Scores:  FACILITATES [place] RESTRICTS [place]                 X    HABITS                 X    PERSONAL CAUSATION                 X    VALUES                 X    INTERESTS                 X    SKILLS                 X    SHORT TERM GOALS                 X    LONG TERM GOALS                 X    INTERPETATION OF PAST EXPERIENCES                 X    PHYSICAL ENVIRONMENT                 X    SOCIAL ENVIRONMENT                 X    READINESS FOR CHANGE                 X    Need for Occupational Therapy:  4 Shows positive occupational participation, no need for OT.   3 Need for minimal intervention/consultative participation     X 2 Need for OT intervention indicated to restore/improve participation   1 Need for extensive OT intervention indicated to improve participation.  Referral for follow up services also recommended.   Assessment:  Patient demonstrates behavior that inhibits participation in occupation.  Patient will benefit from occupational therapy intervention in order to improve time management, financial management, stress management, job readiness skills, social skills, and health management skills in preparation to return to full time community living and to be a productive community member.    Plan:  Patient will participate in skilled occupational therapy sessions individually or in a group setting to improve coping skills, psychosocial skills, and emotional skills required to return to prior level of function. Treatment will be 4-5 times per week for  3 weeks.        S: My self care is poor   O: Education given on self care and its importance in regular BADL/IADL routine. Pt completed self care assessment to identify areas of strength and weakness. Self care assessments covered areas of physical health, psychological health, spiritual health, and  professional health. Pt asked to identifies area of weakness within each area and develop plans for improvement this date. Pt encouraged to brainstorm with other peers to begin goal setting in areas of desired change.   A: Pt presents with flat/depressed affect, engaged and participatory throughout session. Pt shares how her overall self care is poor, and she would like to work on eating more frequent meals. She also shares that she would like to work on her emotional self care and relationship with her husband.  P: OT group will be x3 per week while pt in [place]               OT Education - 02/21/19 1139    Education Details  education given on self care    Person(s) Educated  Patient    Methods  Explanation;Handout    Comprehension  Verbalized understanding       OT Short Term Goals - 02/21/19 1140      OT SHORT TERM GOAL #1   Title  Pt will be educated on strategies to improve psychosocial skills needed to participate fully in all daily, work, and leisure activities    Time  4    Period  Weeks    Status  New    Target Date  03/21/19      OT SHORT TERM GOAL #2   Title  Pt will apply psychosocial skills and coping mechanisms to daily activities in order to function independently and reintegrate into community    Time  4    Period  Weeks    Status  New      OT SHORT TERM GOAL #3   Title  Pt will recall and/or apply 1-3 sleep hygiene strategies to improve function in BADL routine upon reintegrating into community    Time  4    Period  Weeks    Status  New      OT SHORT TERM GOAL #4   Title  Pt will engage in goal setting to improve functional BADL/IADL routine upon reintegrating into community.    Time  4    Period  Weeks    Status  New               Plan - 02/21/19 1140    OT Occupational Profile and History  Problem Focused Assessment - Including review of records relating to presenting problem    Occupational performance deficits (Please refer to  evaluation for details):  ADL's;IADL's;Rest and Sleep;Leisure;Social Participation    Body Structure / Function / Physical Skills  ADL;IADL    Cognitive Skills  Attention;Energy/Drive    Psychosocial Skills  Optometrist and Behaviors;Habits    Rehab Potential  Good    Clinical Decision Making  Several treatment options, min-mod task modification necessary    Comorbidities Affecting Occupational Performance:  May have comorbidities impacting occupational performance    Modification or Assistance to Complete Evaluation   Min-Moderate modification of tasks or assist with assess necessary to complete eval    OT Frequency  3x / week    OT Duration  4 weeks    OT Treatment/Interventions  Coping strategies training;Psychosocial skills  training;Self-care/ADL training;Other (comment)   community reintegration   Consulted and Agree with Plan of Care  Patient       Patient will benefit from skilled therapeutic intervention in order to improve the following deficits and impairments:   Body Structure / Function / Physical Skills: ADL, IADL Cognitive Skills: Attention, Energy/Drive Psychosocial Skills: Coping Strategies, Routines and Behaviors, Habits   Visit Diagnosis: Bipolar 2 disorder, major depressive episode ([place])  Difficulty coping    Problem List Patient Active Problem List   Diagnosis Date Noted  . Peripheral neuropathy 02/15/2019  . Dizziness 09/27/2018  . Polypharmacy 09/27/2018  . Severe recurrent major depression without psychotic features ([place]) 08/30/2018  . Bipolar 2 disorder, major depressive episode ([place]) 07/10/2018  . MDD (major depressive disorder), recurrent, severe, with psychosis ([employer]) 07/10/2018  . CKD (chronic kidney disease), stage IV ([place]) 07/01/2018  . Transient Hypoglycemia 07/01/2018  . Chronic migraine 01/17/2017  . Diabetic peripheral neuropathy ([place]) 01/17/2017  . OSA (obstructive sleep apnea) 07/27/2016  . Wheezing 07/27/2016  .  Hyperglycemia 12/25/2012  . Acute on chronic renal failure ([place]) 12/25/2012  . Pulmonary infiltrates 10/02/2012  . Postnasal drip 10/02/2012  . Hyperkalemia 09/25/2012  . Morbid obesity ([place]) 09/24/2012  . HTN (hypertension) 09/24/2012  . Bipolar II disorder ([place]) 09/24/2012  . URI (upper respiratory infection) 09/24/2012  . DKA, type 1 ([place]) 11/04/2011  . Gastroenteritis 11/03/2011  . DM (diabetes mellitus), type 1, uncontrolled ([place]) 11/03/2011  . Hyponatremia 11/03/2011  . Elevated lipase 11/03/2011  . Hypothyroidism 11/03/2011  . Tobacco abuse 11/03/2011  . SUI (stress urinary incontinence, female) 08/16/2011   Zenovia Jarred, MSOT, OTR/L Behavioral Health OT/ Acute Relief OT PHP Office: [place] 02/21/2019, 11:41 AM  [facility] HOSPITALIZATION PROGRAM [place], [place], [address] Phone: [phone]   Fax:  [phone]  Name: Jasmine [name] MRN: [id number] Date of Birth: 1971/02/05

## 2019-02-21 NOTE — Psych (Signed)
Virtual Visit via Video Note  I connected with Jasmine [name] on 02/21/19 at  9:00 AM EDT by a video enabled telemedicine application and verified that I am speaking with the correct person using two identifiers.   I discussed the limitations of evaluation and management by telemedicine and the availability of in person appointments. The patient expressed understanding and agreed to proceed.  I discussed the assessment and treatment plan with the patient. The patient was provided an opportunity to ask questions and all were answered. The patient agreed with the plan and demonstrated an understanding of the instructions.   The patient was advised to call back or seek an in-person evaluation if the symptoms worsen or if the condition fails to improve as anticipated.  Pt was provided 30 minutes of non-face-to-face time during this encounter.   Lorin Glass, LCSW    [facility] [place] PHP THERAPIST PROGRESS NOTE  TYRIANNA LIGHTLE [phone]  Session Time: 9:00 - 9:30  Participation Level: Minimal  Behavioral Response: CasualAlertDepressed  Type of Therapy: Group Therapy  Treatment Goals addressed: Coping  Interventions: CBT, DBT, Supportive and Reframing  Summary: Clinician led check-in regarding current stressors and situation, and review of patient completed daily inventory. Clinician utilized active listening and empathetic response and validated patient emotions. Clinician facilitated processing group on pertinent issues.   Therapist Response:  CHRYSTEN WOULFE is a 48 y.o. female who presents with depression symptoms. Patient arrived within time allowed and reports that she feels "not good physically." Patient rates her mood at a 5 on a scale of 1-10 with 10 being great. Pt reports her fibromyalgia and chronic fatigue are giving her problems to day and she attributes it to the change in weather. Pt shares her afternoon yesterday went as planned and she watched a lot of YouTube. Pt  presents as brighter in affect today however is still low. Pt is brief in answers. Pt met with program provider approximately 30 minutes into this session and did not return.       Suicidal/Homicidal: Nowithout intent/plan  Plan: Pt will discharge from [facility] per her request, citing she does not want to participate in group therapy.   Diagnosis: Bipolar 2 disorder, major depressive episode ([place]) [F31.81]    1. Bipolar 2 disorder, major depressive episode ([place])       Lorin Glass, LCSW 02/21/2019

## 2019-02-21 NOTE — Progress Notes (Signed)
Spiritual Care Group 02/21/2019  11:00-12:00.  Group met via web-ex due to COVID-19 precautions.  Group facilitated by Simone Curia, MDiv, [place]   Group focused on topic of "community."  Members reflected on topic in facilitated dialog, identifying responses to topic and notions they hold of community from their previous experience.  Group members utilized value sort cards to identify top qualities they look for in community.  Engaged in facilitated dialog around their value choices, noting origin of these values, how these are realized in their lives, and strategies for engaging these values.    Spiritual care group drew on Motivational Interviewing, Narrative and Adlerian modalities.    Jasmine [name] was not present during group due to meeting with other providers

## 2019-02-22 ENCOUNTER — Other Ambulatory Visit (HOSPITAL_COMMUNITY): Payer: BC Managed Care – PPO

## 2019-02-22 ENCOUNTER — Encounter (HOSPITAL_COMMUNITY): Payer: Self-pay | Admitting: Licensed Clinical Social Worker

## 2019-02-22 ENCOUNTER — Ambulatory Visit (HOSPITAL_COMMUNITY): Payer: BC Managed Care – PPO

## 2019-02-22 NOTE — Progress Notes (Signed)
Virtual Visit via Video Note  I connected with Jasmine [name] on 9/16/202 at  9:00 AM EDT by a video enabled telemedicine application and verified that I am speaking with the correct person using two identifiers.   I discussed the limitations of evaluation and management by telemedicine and the availability of in person appointments. The patient expressed understanding and agreed to proceed.    I discussed the assessment and treatment plan with the patient. The patient was provided an opportunity to ask questions and all were answered. The patient agreed with the plan and demonstrated an understanding of the instructions.   The patient was advised to call back or seek an in-person evaluation if the symptoms worsen or if the condition fails to improve as anticipated.  I provided 00 minutes of non-face-to-face time during this encounter.   Derrill Center, NP    Behavioral Health Partial Program Assessment Note  Date: 02/21/2019 Name: Jasmine [name] MRN: [id number]     HPI: Jasmine [name] t is a 48 y.o. Caucasian female presents with worsening depression and anxiety. Patient was re-referred due to patient's reported symptoms. During this assessment, patient continues to report " I don't like sharing in group, this program is not for me."  Jasmine [name] reported a follow-up appointment today at 10:30 am and decided  that she wouldn't return    During patient previous admission- Jasmine [name] stated a history of passive suicidal ideations that appear to be chronic in nature.  Reports previous suicide attempts to overdose.  Currently denying intent or plan.  Reports multiple stressors to include her current family situation.  States she is not happy in her marriage.  "  Feels like her roommates."  states her father is currently residing in their household which is causing undue stress.  Jasmine [name] reports some infidelities on her side due to the lack of attention received by her husband.  She reports  feeling worthless and frustration with the lack of communicat communication between she and her husband.  Stated the poor communication skills is related to her husband being on the autistic spectrum (undiagnosed)  "  We both believe he is high functioning." reports her husband not interested in attending marital counseling at this time.  Reports her husband works for most of the day.  Jasmine [name] reported she was recently denied to renew her disability which is put a strain on her finances.  Reports she is currently followed by Dr. Modesta Messing and sees a therapist regularly.  Patient was enrolled in partial psychiatric program on 01/23/19.  Primary complaints include: depression worse, poor concentration and relationship difficulties.  Onset of symptoms was gradual with gradually worsening course since that time. Psychosocial Stressors include the following: family and financial.  Reported previous inpatient admissions.  Reports family history of mental illness.  Reports paternal aunts schizoaffective disorder.  Mother: Bipolar Brother: Schizophrenia  I have reviewed the following documentation dated 01/23/2019: past psychiatric history, past medical history and past social and family history  Complaints of Pain: nonear Past Psychiatric History:  First psychiatric contact, Past psychiatric hospitalizations and Previous suicide attempts  Currently in treatment with Wellbutrin 300 daily.  Lamictal 200 daily Cymbalta 60 mg daily  Substance Abuse History: Use of Alcohol: denied Use of Caffeine: denies use Use of over the counter:   Past Surgical History:  Procedure Laterality Date  . INCONTINENCE SURGERY    . NASAL FRACTURE SURGERY    . ovary removed    . OVARY SURGERY    .  PUBOVAGINAL SLING  08/16/2011   Procedure: Jasmine [name];  Surgeon: Bernestine Amass, MD;  Location: [facility];  Service: Urology;  Laterality: N/A;         . UTERINE FIBROID SURGERY  2001    Past Medical History:   Diagnosis Date  . Anxiety   . Balance problems   . Bipolar disorder ([place])   . Chronic fatigue   . CKD (chronic kidney disease), stage II   . Depression   . Diabetes mellitus   . DKA, type 1 ([place]) 11/04/2011  . Elevated cholesterol   . Fibromyalgia   . GERD (gastroesophageal reflux disease)   . Headache   . History of suicidal ideation   . Hyperlipemia   . Hypertension   . Hypothyroidism   . IBS (irritable bowel syndrome)   . Obesity   . Sleep apnea    HAS C -PAP / DOES NOT USE  . Stress incontinence    Pt had surgery to correct this.  . Tachycardia   . Tobacco abuse   . Tremor   . UTI (lower urinary tract infection)    Outpatient Encounter Medications as of 01/23/2019  Medication Sig  . aspirin EC 81 MG tablet Take 81 mg by mouth at bedtime.   Marland Kitchen buPROPion (WELLBUTRIN XL) 300 MG 24 hr tablet Take 1 tablet (300 mg total) by mouth daily.  . carvedilol (COREG) 25 MG tablet Take 25 mg by mouth 2 (two) times daily with a meal.  . cephALEXin (KEFLEX) 500 MG capsule Take 1 capsule (500 mg total) by mouth 2 (two) times daily.  . Cholecalciferol (VITAMIN D3 SUPER STRENGTH) 50 MCG (2000 UT) TABS Take 1 tablet by mouth daily.   Marland Kitchen dicyclomine (BENTYL) 10 MG capsule Take 10 mg by mouth 3 (three) times daily as needed for spasms.  . DULoxetine (CYMBALTA) 60 MG capsule Take 1 capsule (60 mg total) by mouth at bedtime.  . fluticasone (FLONASE) 50 MCG/ACT nasal spray Place 2 sprays into the nose daily as needed for allergies.   . folic acid (FOLVITE) A999333 MCG tablet Take 400 mcg by mouth every evening.   . furosemide (LASIX) 20 MG tablet Take 20 mg by mouth daily.   . insulin glargine (LANTUS) 100 UNIT/ML injection Inject 0.5 mLs (50 Units total) into the skin daily.  Marland Kitchen lamoTRIgine (LAMICTAL) 200 MG tablet Take 1 tablet (200 mg total) by mouth daily.  Marland Kitchen levothyroxine (SYNTHROID, LEVOTHROID) 150 MCG tablet Take 150 mcg by mouth daily before breakfast.  . LORazepam (ATIVAN) 0.5 MG tablet Take 1  tablet (0.5 mg total) by mouth 2 (two) times daily as needed for anxiety.  Marland Kitchen lubiprostone (AMITIZA) 8 MCG capsule Take 8 mcg by mouth as needed for constipation.   Marland Kitchen NOVOLOG 100 UNIT/ML injection Inject 2.425-2.95 Units into the skin continuous. Via pump 7pm to midnight 2.950 8:30am -7pm2.925 Midnight - 8:30 2.425  . Omega-3 Fatty Acids (FISH OIL PO) Take 1 capsule by mouth daily.  Marland Kitchen omeprazole (PRILOSEC) 20 MG capsule Take 20 mg by mouth at bedtime.   . ondansetron (ZOFRAN ODT) 4 MG disintegrating tablet Take 1 tablet (4 mg total) by mouth every 8 (eight) hours as needed. (Patient taking differently: Take 4 mg by mouth every 8 (eight) hours as needed for nausea or vomiting. )  . pravastatin (PRAVACHOL) 80 MG tablet Take 80 mg by mouth at bedtime.  Marland Kitchen PROAIR HFA 108 (90 Base) MCG/ACT inhaler Inhale 1-2 puffs into the lungs every 6 (six)  hours as needed for wheezing or shortness of breath.   . rizatriptan (MAXALT-MLT) 10 MG disintegrating tablet Take 1 tablet (10 mg total) as needed by mouth. May repeat in 2 hours if needed (Patient taking differently: Take 10 mg by mouth as needed for migraine. May repeat in 2 hours if needed)  . traMADol (ULTRAM) 50 MG tablet Take 50 mg by mouth 2 (two) times daily.    No facility-administered encounter medications on file as of 01/23/2019.    Allergies  Allergen Reactions  . Ciprofloxacin Swelling and Other (See Comments)    Per pt caused lips swell and nauseous feeling  . Levaquin [Levofloxacin] Swelling and Other (See Comments)    Per pt caused lips swell and nauseous feeling  . Buspar [Buspirone] Other (See Comments)    abd cramping  . Linaclotide Other (See Comments)  . Advair Diskus [Fluticasone-Salmeterol] Other (See Comments)    Thrush   . Biaxin [Clarithromycin] Rash  . Hydroxyzine Palpitations    Social History   Tobacco Use  . Smoking status: Former Smoker    Packs/day: 0.75    Years: 20.00    Pack years: 15.00    Types: Cigarettes     Quit date: 06/07/2012    Years since quitting: 6.6  . Smokeless tobacco: Never Used  Substance Use Topics  . Alcohol use: No   Functioning Relationships: strained with spouse or significant others Education: College       Please specify degree: "Some college" Other Pertinent History: None Family History  Problem Relation Age of Onset  . Asthma Mother   . Bipolar disorder Mother   . Heart disease Father   . Lymphoma Father   . Hypertension Father   . Thyroid disease Father   . Hyperlipidemia Father   . Diabetes Father   . Cancer Paternal Grandmother        lung and breast  . Bladder Cancer Paternal Grandfather   . Suicidality Maternal Grandfather   . Thyroid disease Brother      Review of Systems Constitutional: negative  Objective:  There were no vitals filed for this visit.  Physical Exam:   Mental Status Exam: Appearance:  Well groomed Psychomotor::  Within Normal Limits Attention span and concentration: Normal Behavior: calm, cooperative and adequate rapport can be established Speech:  normal volume Mood:  depressed Affect: flat Thought Process:  Coherent Thought Content:  Logical Orientation:  person, place and time/date Cognition:  grossly intact Insight:  Intact Judgment:  Intact Estimate of Intelligence: Average Fund of knowledge: Aware of current events Memory: Recent and remote intact Abnormal movements: None Gait and station: Normal  Assessment:  Diagnosis: No primary diagnosis found. No diagnosis found.  Indications for admission: inpatient care required if not in partial hospital program  Plan: Will discharge from Partial Hospitalization patient's request.  patient's current medications are to be continued, a comprehensive treatment plan will be developed and side effects of medications have been reviewed with patient  Treatment options and alternatives reviewed with patient and patient understands the above plan.  Treatment plan was  reviewed and agreed upon by NP T. Axten Pascucci inpatient Rachel Bo Bolds need for group services, however has declined at this time.    Derrill Center, NP

## 2019-02-23 ENCOUNTER — Other Ambulatory Visit (HOSPITAL_COMMUNITY): Payer: BC Managed Care – PPO

## 2019-02-23 ENCOUNTER — Ambulatory Visit (HOSPITAL_COMMUNITY): Payer: BC Managed Care – PPO

## 2019-02-26 ENCOUNTER — Other Ambulatory Visit (HOSPITAL_COMMUNITY): Payer: BC Managed Care – PPO

## 2019-02-27 ENCOUNTER — Ambulatory Visit (HOSPITAL_COMMUNITY): Payer: BC Managed Care – PPO

## 2019-02-27 ENCOUNTER — Other Ambulatory Visit (HOSPITAL_COMMUNITY): Payer: BC Managed Care – PPO

## 2019-02-28 ENCOUNTER — Other Ambulatory Visit (HOSPITAL_COMMUNITY): Payer: BC Managed Care – PPO

## 2019-03-01 ENCOUNTER — Other Ambulatory Visit (HOSPITAL_COMMUNITY): Payer: BC Managed Care – PPO

## 2019-03-01 ENCOUNTER — Ambulatory Visit (HOSPITAL_COMMUNITY): Payer: BC Managed Care – PPO

## 2019-03-02 ENCOUNTER — Ambulatory Visit (HOSPITAL_COMMUNITY): Payer: BC Managed Care – PPO

## 2019-03-02 ENCOUNTER — Other Ambulatory Visit (HOSPITAL_COMMUNITY): Payer: BC Managed Care – PPO

## 2019-03-05 ENCOUNTER — Other Ambulatory Visit (HOSPITAL_COMMUNITY): Payer: BC Managed Care – PPO

## 2019-03-06 ENCOUNTER — Other Ambulatory Visit (HOSPITAL_COMMUNITY): Payer: BC Managed Care – PPO

## 2019-03-06 ENCOUNTER — Ambulatory Visit (HOSPITAL_COMMUNITY): Payer: BC Managed Care – PPO

## 2019-03-07 ENCOUNTER — Emergency Department (HOSPITAL_COMMUNITY)
Admission: EM | Admit: 2019-03-07 | Discharge: 2019-03-08 | Disposition: A | Discharge status: Home / Self Care | Payer: BC Managed Care – PPO | Attending: Emergency Medicine | Admitting: Emergency Medicine

## 2019-03-07 ENCOUNTER — Other Ambulatory Visit (HOSPITAL_COMMUNITY): Payer: BC Managed Care – PPO

## 2019-03-07 ENCOUNTER — Other Ambulatory Visit: Payer: Self-pay

## 2019-03-07 ENCOUNTER — Encounter (HOSPITAL_COMMUNITY): Payer: Self-pay | Admitting: Emergency Medicine

## 2019-03-07 DIAGNOSIS — E860 Dehydration: Secondary | ICD-10-CM | POA: Insufficient documentation

## 2019-03-07 DIAGNOSIS — N184 Chronic kidney disease, stage 4 (severe): Secondary | ICD-10-CM | POA: Diagnosis not present

## 2019-03-07 DIAGNOSIS — E1022 Type 1 diabetes mellitus with diabetic chronic kidney disease: Secondary | ICD-10-CM | POA: Insufficient documentation

## 2019-03-07 DIAGNOSIS — E039 Hypothyroidism, unspecified: Secondary | ICD-10-CM | POA: Diagnosis not present

## 2019-03-07 DIAGNOSIS — I129 Hypertensive chronic kidney disease with stage 1 through stage 4 chronic kidney disease, or unspecified chronic kidney disease: Secondary | ICD-10-CM | POA: Diagnosis not present

## 2019-03-07 DIAGNOSIS — Z79899 Other long term (current) drug therapy: Secondary | ICD-10-CM | POA: Insufficient documentation

## 2019-03-07 DIAGNOSIS — Z87891 Personal history of nicotine dependence: Secondary | ICD-10-CM | POA: Insufficient documentation

## 2019-03-07 DIAGNOSIS — Z3202 Encounter for pregnancy test, result negative: Secondary | ICD-10-CM | POA: Diagnosis not present

## 2019-03-07 DIAGNOSIS — Z794 Long term (current) use of insulin: Secondary | ICD-10-CM | POA: Insufficient documentation

## 2019-03-07 DIAGNOSIS — E1065 Type 1 diabetes mellitus with hyperglycemia: Secondary | ICD-10-CM | POA: Diagnosis not present

## 2019-03-07 DIAGNOSIS — R739 Hyperglycemia, unspecified: Secondary | ICD-10-CM

## 2019-03-07 LAB — BASIC METABOLIC PANEL
Anion gap: 10 (ref 5–15)
BUN: 30 mg/dL — ABNORMAL HIGH (ref 6–20)
CO2: 26 mmol/L (ref 22–32)
Calcium: 8.4 mg/dL — ABNORMAL LOW (ref 8.9–10.3)
Chloride: 96 mmol/L — ABNORMAL LOW (ref 98–111)
Creatinine, Ser: 2.67 mg/dL — ABNORMAL HIGH (ref 0.44–1.00)
GFR calc Af Amer: 24 mL/min — ABNORMAL LOW (ref >60)
GFR calc non Af Amer: 20 mL/min — ABNORMAL LOW (ref >60)
Glucose, Bld: 489 mg/dL — ABNORMAL HIGH (ref 70–99)
Potassium: 4.6 mmol/L (ref 3.5–5.1)
Sodium: 132 mmol/L — ABNORMAL LOW (ref 135–145)

## 2019-03-07 LAB — CBC
HCT: 41.1 % (ref 36.0–46.0)
Hemoglobin: 12.7 g/dL (ref 12.0–15.0)
MCH: 28.8 pg (ref 26.0–34.0)
MCHC: 30.9 g/dL (ref 30.0–36.0)
MCV: 93.2 fL (ref 80.0–100.0)
Platelets: 236 10*3/uL (ref 150–400)
RBC: 4.41 MIL/uL (ref 3.87–5.11)
RDW: 13.8 % (ref 11.5–15.5)
WBC: 6.2 10*3/uL (ref 4.0–10.5)
nRBC: 0 % (ref 0.0–0.2)

## 2019-03-07 LAB — CBG MONITORING, ED: Glucose-Capillary: 481 mg/dL — ABNORMAL HIGH (ref 70–99)

## 2019-03-07 MED ORDER — SODIUM CHLORIDE 0.9 % IV BOLUS
500.0000 mL | Freq: Once | INTRAVENOUS | Status: AC
  Administered 2019-03-08: 500 mL via INTRAVENOUS

## 2019-03-07 MED ORDER — SODIUM CHLORIDE 0.9 % IV BOLUS
1000.0000 mL | Freq: Once | INTRAVENOUS | Status: AC
  Administered 2019-03-08: 1000 mL via INTRAVENOUS
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
## 2019-03-07 NOTE — ED Triage Notes (Addendum)
Pt arrives from home via [facility]. Pt uses an insulin pump at home. Pt states after eating tonight her BS was 547. Pt was given 575ml NS en route. Denies N/V or pain.

## 2019-03-08 ENCOUNTER — Other Ambulatory Visit (HOSPITAL_COMMUNITY): Payer: BC Managed Care – PPO

## 2019-03-08 ENCOUNTER — Ambulatory Visit (HOSPITAL_COMMUNITY): Payer: BC Managed Care – PPO

## 2019-03-08 LAB — URINALYSIS, ROUTINE W REFLEX MICROSCOPIC
Bacteria, UA: NONE SEEN
Bilirubin Urine: NEGATIVE
Glucose, UA: >=500 mg/dL — AB
Hgb urine dipstick: NEGATIVE
Ketones, ur: NEGATIVE mg/dL
Leukocytes,Ua: NEGATIVE
Nitrite: NEGATIVE
Protein, ur: 30 mg/dL — AB
Specific Gravity, Urine: 1.008 (ref 1.005–1.030)
pH: 6 (ref 5.0–8.0)

## 2019-03-08 LAB — CBG MONITORING, ED
Glucose-Capillary: 366 mg/dL — ABNORMAL HIGH (ref 70–99)
Glucose-Capillary: 59 mg/dL — ABNORMAL LOW (ref 70–99)
Glucose-Capillary: 90 mg/dL (ref 70–99)
Glucose-Capillary: 195 mg/dL — ABNORMAL HIGH (ref 70–99)
Glucose-Capillary: 215 mg/dL — ABNORMAL HIGH (ref 70–99)

## 2019-03-08 LAB — POC URINE PREG, ED: Preg Test, Ur: NEGATIVE

## 2019-03-08 MED ORDER — INSULIN ASPART 100 UNIT/ML ~~LOC~~ SOLN
8.0000 [IU] | Freq: Once | SUBCUTANEOUS | Status: AC
  Administered 2019-03-08: 01:00:00 8 [IU] via INTRAVENOUS
  Filled 2019-03-08: qty 1

## 2019-03-08 MED ORDER — SODIUM CHLORIDE 0.9 % IV BOLUS
1000.0000 mL | Freq: Once | INTRAVENOUS | Status: AC
  Administered 2019-03-08: 1000 mL via INTRAVENOUS

## 2019-03-08 MED ORDER — INSULIN ASPART 100 UNIT/ML ~~LOC~~ SOLN
5.0000 [IU] | Freq: Once | SUBCUTANEOUS | Status: AC
  Administered 2019-03-08: 03:00:00 5 [IU] via INTRAVENOUS

## 2019-03-08 NOTE — ED Notes (Addendum)
Blood sugar 59 reported to EDP Knapp,  Grape juice given to patient with crackers Heating up meal for patient with diet drink.  Pt alert, oriented and currently comfortable. Pt had not turned off her glucose monitor, pt told to turn off.  Pt states the glucose monitor for home is currently off.

## 2019-03-08 NOTE — Discharge Instructions (Signed)
Please continue to monitor your blood sugar closely.  Please contact your endocrinologist if you have further problems to see if he has any suggestions about your insulin pump rates.  Return to the emergency department if you get worse again.

## 2019-03-08 NOTE — ED Provider Notes (Signed)
[facility] EMERGENCY DEPARTMENT Provider Note   CSN: [id number] Arrival date & time: 03/07/19  2303   Time seen 11:28 PM  History   Chief Complaint Chief Complaint  Patient presents with  . Hyperglycemia    HPI Jasmine [name] is a 48 y.o. female.     HPI patient states she has been diabetic for 27 years and has been hard to control.  She has been using an insulin pump off and on for the past 13 years and currently is wearing 1.  She states her blood sugar was in the 200s this evening when she ate dinner.  She states she ate a frozen dinner with potatoes, corn, and "[place] meat" which she states was pork.  She states after that her blood sugars got up into the 500s.  She states she gave herself 15 units of insulin bolus when she ate.  She states normally after she eats her blood sugars in the 200s.  She states she has not felt well for the past 2 days and describes having no energy.  She also states states today she started feeling thirsty and she started having polyuria and nocturia last night.  She has checked her urine and states there were no ketones.  She has had nausea without vomiting or diarrhea.  She states she did have some upper abdominal discomfort described as aching since this morning but states she also has IBS.  She states she had some chest discomfort and shortness of breath earlier but not now.  She states she is followed by endocrinologist, Dr. Iran Planas with [facility] in [place].  Currently her pump is set at 2.75 units/h.  PCP Aletha Halim., PA-C Endocrinology Dr Iran Planas [facility]  Past Medical History:  Diagnosis Date  . Anxiety   . Balance problems   . Bipolar disorder ([place])   . Chronic fatigue   . CKD (chronic kidney disease), stage II   . Depression   . Diabetes mellitus   . DKA, type 1 ([place]) 11/04/2011  . Elevated cholesterol   . Fibromyalgia   . GERD (gastroesophageal reflux disease)   . Headache   . History of suicidal ideation   .  Hyperlipemia   . Hypertension   . Hypothyroidism   . IBS (irritable bowel syndrome)   . Obesity   . Sleep apnea    HAS C -PAP / DOES NOT USE  . Stress incontinence    Pt had surgery to correct this.  . Tachycardia   . Tobacco abuse   . Tremor   . UTI (lower urinary tract infection)     Patient Active Problem List   Diagnosis Date Noted  . Peripheral neuropathy 02/15/2019  . Dizziness 09/27/2018  . Polypharmacy 09/27/2018  . Severe recurrent major depression without psychotic features ([place]) 08/30/2018  . Bipolar 2 disorder, major depressive episode ([place]) 07/10/2018  . MDD (major depressive disorder), recurrent, severe, with psychosis ([place]) 07/10/2018  . CKD (chronic kidney disease), stage IV ([place]) 07/01/2018  . Transient Hypoglycemia 07/01/2018  . Chronic migraine 01/17/2017  . Diabetic peripheral neuropathy ([place]) 01/17/2017  . OSA (obstructive sleep apnea) 07/27/2016  . Wheezing 07/27/2016  . Hyperglycemia 12/25/2012  . Acute on chronic renal failure ([place]) 12/25/2012  . Pulmonary infiltrates 10/02/2012  . Postnasal drip 10/02/2012  . Hyperkalemia 09/25/2012  . Morbid obesity ([place]) 09/24/2012  . HTN (hypertension) 09/24/2012  . Bipolar II disorder ([place]) 09/24/2012  . URI (upper respiratory infection) 09/24/2012  .  DKA, type 1 ([place]) 11/04/2011  . Gastroenteritis 11/03/2011  . DM (diabetes mellitus), type 1, uncontrolled ([place]) 11/03/2011  . Hyponatremia 11/03/2011  . Elevated lipase 11/03/2011  . Hypothyroidism 11/03/2011  . Tobacco abuse 11/03/2011  . SUI (stress urinary incontinence, female) 08/16/2011    Past Surgical History:  Procedure Laterality Date  . INCONTINENCE SURGERY    . NASAL FRACTURE SURGERY    . ovary removed    . OVARY SURGERY    . PUBOVAGINAL SLING  08/16/2011   Procedure: [name];  Surgeon: Bernestine Amass, MD;  Location: [facility]S;  Service: Urology;  Laterality: N/A;         . UTERINE FIBROID SURGERY  2001     OB History     Gravida  1   Para      Term      Preterm      AB      Living        SAB      TAB      Ectopic      Multiple      Live Births               Home Medications    Prior to Admission medications   Medication Sig Start Date End Date Taking? Authorizing Provider  aspirin EC 81 MG tablet Take 81 mg by mouth at bedtime.     [provider]  buPROPion (WELLBUTRIN XL) 300 MG 24 hr tablet Take 1 tablet (300 mg total) by mouth daily. 02/14/19   Norman Clay, MD  carvedilol (COREG) 25 MG tablet Take 25 mg by mouth 2 (two) times daily with a meal.    [provider]  Cholecalciferol (VITAMIN D3 SUPER STRENGTH) 50 MCG (2000 UT) TABS Take 1 tablet by mouth daily.  07/26/18   [provider]  dicyclomine (BENTYL) 10 MG capsule Take 10 mg by mouth 3 (three) times daily as needed for spasms.    [provider]  DULoxetine (CYMBALTA) 60 MG capsule Take 1 capsule (60 mg total) by mouth at bedtime. 12/06/18   Norman Clay, MD  fluticasone (FLONASE) 50 MCG/ACT nasal spray Place 2 sprays into the nose daily as needed for allergies.     [provider]  folic acid (FOLVITE) A999333 MCG tablet Take 400 mcg by mouth every evening.     [provider]  furosemide (LASIX) 20 MG tablet Take 20 mg by mouth daily.     [provider]  gabapentin (NEURONTIN) 100 MG capsule Take 300 mg by mouth at bedtime.    [provider]  insulin glargine (LANTUS) 100 UNIT/ML injection Inject 0.5 mLs (50 Units total) into the skin daily. 09/04/18   Derrill Center, NP  lamoTRIgine (LAMICTAL) 200 MG tablet Take 1 tablet (200 mg total) by mouth daily. 02/14/19   Norman Clay, MD  levothyroxine (SYNTHROID, LEVOTHROID) 150 MCG tablet Take 150 mcg by mouth daily before breakfast.    [provider]  LORazepam (ATIVAN) 0.5 MG tablet Take 1 tablet (0.5 mg total) by mouth 2 (two) times daily as needed for anxiety. 02/14/19   Norman Clay, MD  lubiprostone  (AMITIZA) 8 MCG capsule Take 8 mcg by mouth as needed for constipation.     [provider]  NOVOLOG 100 UNIT/ML injection Inject 2.425-2.95 Units into the skin continuous. Via pump 7pm to midnight 2.950 8:30am -7pm2.925 Midnight - 8:30 2.425 02/24/17   [provider]  Omega-3  Fatty Acids (FISH OIL PO) Take 1 capsule by mouth daily.    [provider]  omeprazole (PRILOSEC) 20 MG capsule Take 20 mg by mouth at bedtime.     [provider]  ondansetron (ZOFRAN ODT) 4 MG disintegrating tablet Take 1 tablet (4 mg total) by mouth every 8 (eight) hours as needed. Patient taking differently: Take 4 mg by mouth every 8 (eight) hours as needed for nausea or vomiting.  01/17/17   Marcial Pacas, MD  pravastatin (PRAVACHOL) 80 MG tablet Take 80 mg by mouth at bedtime.    [provider]  PROAIR HFA 108 [phone] Base) MCG/ACT inhaler Inhale 1-2 puffs into the lungs every 6 (six) hours as needed for wheezing or shortness of breath.  12/30/16   [provider]  QUEtiapine (SEROQUEL) 25 MG tablet Take 1 tablet (25 mg total) by mouth 2 (two) times daily. 01/31/19 01/31/20  Derrill Center, NP  rizatriptan (MAXALT-MLT) 10 MG disintegrating tablet Take 1 tablet (10 mg total) as needed by mouth. May repeat in 2 hours if needed Patient taking differently: Take 10 mg by mouth as needed for migraine. May repeat in 2 hours if needed 04/20/17   Marcial Pacas, MD  traMADol (ULTRAM) 50 MG tablet Take 50 mg by mouth 2 (two) times daily.  07/14/18   [provider]    Family History Family History  Problem Relation Age of Onset  . Asthma Mother   . Bipolar disorder Mother   . Heart disease Father   . Lymphoma Father   . Hypertension Father   . Thyroid disease Father   . Hyperlipidemia Father   . Diabetes Father   . Cancer Paternal Grandmother        lung and breast  . Bladder Cancer Paternal Grandfather   . Suicidality Maternal Grandfather   . Thyroid disease Brother      Social History Social History   Tobacco Use  . Smoking status: Former Smoker    Packs/day: 0.75    Years: 20.00    Pack years: 15.00    Types: Cigarettes    Quit date: 06/07/2012    Years since quitting: 6.7  . Smokeless tobacco: Never Used  Substance Use Topics  . Alcohol use: No  . Drug use: No     Allergies   Ciprofloxacin, Levaquin [levofloxacin], Buspar [buspirone], Linaclotide, Advair diskus [fluticasone-salmeterol], Biaxin [clarithromycin], and Hydroxyzine   Review of Systems Review of Systems  All other systems reviewed and are negative.    Physical Exam Updated Vital Signs BP 136/72   Pulse 81   Temp 98 F (36.7 C) (Oral)   Resp 18   SpO2 96%   Physical Exam Vitals signs and nursing note reviewed.  Constitutional:      General: She is not in acute distress.    Appearance: Normal appearance. She is well-developed. She is not ill-appearing or toxic-appearing.  HENT:     Head: Normocephalic and atraumatic.     Right Ear: External ear normal.     Left Ear: External ear normal.     Nose: Nose normal. No mucosal edema or rhinorrhea.     Mouth/Throat:     Mouth: Mucous membranes are dry.     Dentition: No dental abscesses.     Pharynx: No oropharyngeal exudate, posterior oropharyngeal erythema or uvula swelling.  Eyes:     Extraocular Movements: Extraocular movements intact.     Conjunctiva/sclera: Conjunctivae normal.     Pupils: Pupils are  equal, round, and reactive to light.  Neck:     Musculoskeletal: Full passive range of motion without pain, normal range of motion and neck supple.  Cardiovascular:     Rate and Rhythm: Normal rate and regular rhythm.     Heart sounds: Normal heart sounds. No murmur. No friction rub. No gallop.   Pulmonary:     Effort: Pulmonary effort is normal. No respiratory distress.     Breath sounds: Normal breath sounds. No wheezing, rhonchi or rales.  Chest:     Chest wall: No tenderness or crepitus.  Abdominal:      General: Bowel sounds are normal. There is no distension.     Palpations: Abdomen is soft.     Tenderness: There is no abdominal tenderness. There is no guarding or rebound.  Musculoskeletal: Normal range of motion.        General: No tenderness.     Comments: Moves all extremities well.  Patient is in a cam walker on her right lower extremity for Charcot's foot  Skin:    General: Skin is warm and dry.     Coloration: Skin is not pale.     Findings: No erythema or rash.  Neurological:     General: No focal deficit present.     Mental Status: She is alert and oriented to person, place, and time.     Cranial Nerves: No cranial nerve deficit.  Psychiatric:        Mood and Affect: Mood normal. Mood is not anxious.        Speech: Speech normal.        Behavior: Behavior normal.        Thought Content: Thought content normal.      ED Treatments / Results  Labs (all labs ordered are listed, but only abnormal results are displayed) Results for orders placed or performed during the hospital encounter of A999333  Basic metabolic panel  Result Value Ref Range   Sodium 132 (L) 135 - 145 mmol/L   Potassium 4.6 3.5 - 5.1 mmol/L   Chloride 96 (L) 98 - 111 mmol/L   CO2 26 22 - 32 mmol/L   Glucose, Bld 489 (H) 70 - 99 mg/dL   BUN 30 (H) 6 - 20 mg/dL   Creatinine, Ser 2.67 (H) 0.44 - 1.00 mg/dL   Calcium 8.4 (L) 8.9 - 10.3 mg/dL   GFR calc non Af Amer 20 (L) >60 mL/min   GFR calc Af Amer 24 (L) >60 mL/min   Anion gap 10 5 - 15  CBC  Result Value Ref Range   WBC 6.2 4.0 - 10.5 K/uL   RBC 4.41 3.87 - 5.11 MIL/uL   Hemoglobin 12.7 12.0 - 15.0 g/dL   HCT 41.1 36.0 - 46.0 %   MCV 93.2 80.0 - 100.0 fL   MCH 28.8 26.0 - 34.0 pg   MCHC 30.9 30.0 - 36.0 g/dL   RDW 13.8 11.5 - 15.5 %   Platelets 236 150 - 400 K/uL   nRBC 0.0 0.0 - 0.2 %  Urinalysis, Routine w reflex microscopic  Result Value Ref Range   Color, Urine STRAW (A) YELLOW   APPearance CLEAR CLEAR   Specific Gravity, Urine  1.008 1.005 - 1.030   pH 6.0 5.0 - 8.0   Glucose, UA >=500 (A) NEGATIVE mg/dL   Hgb urine dipstick NEGATIVE NEGATIVE   Bilirubin Urine NEGATIVE NEGATIVE   Ketones, ur NEGATIVE NEGATIVE mg/dL   Protein, ur 30 (A) NEGATIVE  mg/dL   Nitrite NEGATIVE NEGATIVE   Leukocytes,Ua NEGATIVE NEGATIVE   RBC / HPF 0-5 0 - 5 RBC/hpf   WBC, UA 0-5 0 - 5 WBC/hpf   Bacteria, UA NONE SEEN NONE SEEN   Squamous Epithelial / LPF 0-5 0 - 5  CBG monitoring, ED  Result Value Ref Range   Glucose-Capillary 481 (H) 70 - 99 mg/dL  POC urine preg, ED  Result Value Ref Range   Preg Test, Ur NEGATIVE NEGATIVE  CBG monitoring, ED  Result Value Ref Range   Glucose-Capillary 366 (H) 70 - 99 mg/dL  POC CBG, ED  Result Value Ref Range   Glucose-Capillary 59 (L) 70 - 99 mg/dL  CBG monitoring, ED  Result Value Ref Range   Glucose-Capillary 90 70 - 99 mg/dL  CBG monitoring, ED  Result Value Ref Range   Glucose-Capillary 195 (H) 70 - 99 mg/dL  CBG monitoring, ED  Result Value Ref Range   Glucose-Capillary 215 (H) 70 - 99 mg/dL   Laboratory interpretation all normal except hyperglycemia without metabolic acidosis, renal insufficiency which is slightly worse than her prior in August    EKG None  Radiology No results found.  Procedures Procedures (including critical care time)  Medications Ordered in ED Medications  sodium chloride 0.9 % bolus 1,000 mL (0 mLs Intravenous Stopped 03/08/19 0118)  sodium chloride 0.9 % bolus 500 mL (0 mLs Intravenous Stopped 03/08/19 0118)  insulin aspart (novoLOG) injection 8 Units (8 Units Intravenous Given 03/08/19 0117)  insulin aspart (novoLOG) injection 5 Units (5 Units Intravenous Given 03/08/19 0235)  sodium chloride 0.9 % bolus 1,000 mL (0 mLs Intravenous Stopped 03/08/19 0605)     Initial Impression / Assessment and Plan / ED Course  I have reviewed the triage vital signs and the nursing notes.  Pertinent labs & imaging results that were available during my care of  the patient were reviewed by me and considered in my medical decision making (see chart for details).      Patient was given IV fluids, when I reviewed her blood work she had hyperglycemia without metabolic acidosis.  I gave her a bolus of insulin 8 units IV with her IV fluids.  About an hour after she received her IV insulin her CBG was rechecked and had gone down to 366.  She was given 5 units of regular insulin IV.  I rechecked patient at 240.  She states her tongue does not feel as dry however she still very thirsty.  She was given a another liter of normal saline.  I project or expect to her CBG will be in the 200s the next time it is checked and she states she feels like she will be able to go home at that point.  3:15 AM nurse reports patient's blood sugar is 59.  She is awake and was given some to eat and drink.  Although we had discussed turning off her insulin pump she had not done it.  She was advised to turn off her insulin pump.  Recheck 4:55 AM patient CBG is back up to 195.  Patient states she has restarted her insulin pump.  We are going to observe her for another hour and if her blood sugar remains in the 200s she feels like she can be discharged.  Her CBG at 6 AM is 215.  Patient feels comfortable to go home.  Final Clinical Impressions(s) / ED Diagnoses   Final diagnoses:  Hyperglycemia  Dehydration  ED Discharge Orders    None      Plan discharge  Rolland Porter, MD, Barbette Or, MD 03/08/19 [phone]

## 2019-03-09 ENCOUNTER — Ambulatory Visit (HOSPITAL_COMMUNITY): Payer: BC Managed Care – PPO

## 2019-03-09 ENCOUNTER — Other Ambulatory Visit (HOSPITAL_COMMUNITY): Payer: BC Managed Care – PPO

## 2019-03-12 ENCOUNTER — Other Ambulatory Visit (HOSPITAL_COMMUNITY): Payer: BC Managed Care – PPO

## 2019-03-13 ENCOUNTER — Ambulatory Visit: Payer: BC Managed Care – PPO | Admitting: Neurology

## 2019-03-13 ENCOUNTER — Ambulatory Visit (HOSPITAL_COMMUNITY): Payer: BC Managed Care – PPO

## 2019-03-14 ENCOUNTER — Other Ambulatory Visit (HOSPITAL_COMMUNITY): Payer: BC Managed Care – PPO

## 2019-03-15 ENCOUNTER — Ambulatory Visit (HOSPITAL_COMMUNITY): Payer: BC Managed Care – PPO | Admitting: Psychiatry

## 2019-03-15 ENCOUNTER — Encounter (HOSPITAL_COMMUNITY): Payer: Self-pay | Admitting: Psychiatry

## 2019-03-15 ENCOUNTER — Ambulatory Visit (HOSPITAL_COMMUNITY): Payer: BC Managed Care – PPO

## 2019-03-15 ENCOUNTER — Ambulatory Visit (INDEPENDENT_AMBULATORY_CARE_PROVIDER_SITE_OTHER): Payer: BC Managed Care – PPO | Admitting: Psychiatry

## 2019-03-15 ENCOUNTER — Other Ambulatory Visit (HOSPITAL_COMMUNITY): Payer: BC Managed Care – PPO

## 2019-03-15 ENCOUNTER — Other Ambulatory Visit: Payer: Self-pay

## 2019-03-15 DIAGNOSIS — F3181 Bipolar II disorder: Secondary | ICD-10-CM

## 2019-03-15 MED ORDER — DULOXETINE HCL 60 MG PO CPEP: 60.0000 mg | ORAL_CAPSULE | Freq: Every day | ORAL | 3 refills | Status: DC

## 2019-03-15 MED ORDER — QUETIAPINE FUMARATE 25 MG PO TABS: 25.0000 mg | ORAL_TABLET | Freq: Every day | ORAL | 3 refills | Status: DC

## 2019-03-15 MED ORDER — LORAZEPAM 0.5 MG PO TABS: 0.5000 mg | ORAL_TABLET | Freq: Two times a day (BID) | ORAL | 3 refills | Status: DC | PRN

## 2019-03-15 MED ORDER — LAMOTRIGINE 200 MG PO TABS: 200.0000 mg | ORAL_TABLET | Freq: Every day | ORAL | 3 refills | Status: DC

## 2019-03-15 MED ORDER — BUPROPION HCL ER (XL) 300 MG PO TB24: 300.0000 mg | ORAL_TABLET | Freq: Every day | ORAL | 3 refills | Status: DC

## 2019-03-15 NOTE — Patient Instructions (Addendum)
1. Continue lamotrigine 200 mg daily  2.Continue duloxetine 60 mgdaily 3.Continuebupropion300 mg daily 4. Continue quetiapine 25 mg at night.  5. Continuelorazepam 0.5 mg twice a day as needed for anxiety 6.Please contact for ECT evaluation:[phone] 7Next appointment: in December  CONTACT INFORMATION  What to do if you need to get in touch with someone regarding a psychiatric issue:  1. EMERGENCY: For psychiatric emergencies (if you are suicidal or if there are any other safety issues) call 911 and/or go to your nearest Emergency Room immediately.   2. IF YOU NEED SOMEONE TO TALK TO RIGHT NOW: Given my clinical responsibilities, I may not be able to speak with you over the phone for a prolonged period of time.  a. You may always call The National Suicide Prevention Lifeline at 1-800-273-TALK 510 146 6695).  b. Your county of residence will also have local crisis services. For [facility]: [place] at [phone] ([place])

## 2019-03-16 ENCOUNTER — Other Ambulatory Visit (HOSPITAL_COMMUNITY): Payer: BC Managed Care – PPO

## 2019-03-16 ENCOUNTER — Ambulatory Visit (HOSPITAL_COMMUNITY): Payer: BC Managed Care – PPO

## 2019-03-22 ENCOUNTER — Telehealth: Payer: Self-pay

## 2019-03-22 MED ORDER — BOTOX 100 UNITS IJ SOLR: 200.0000 [IU] | INTRAMUSCULAR | 3 refills | Status: DC

## 2019-03-22 MED ORDER — BOTOX 100 UNITS IJ SOLR: 200.0000 [IU] | INTRAMUSCULAR | 0 refills | Status: DC

## 2019-03-22 NOTE — Telephone Encounter (Signed)
Jasmine [name] could you send script for the patients Botox over to [facility] in [place]?

## 2019-03-22 NOTE — Telephone Encounter (Signed)
Noted, thank you

## 2019-03-26 NOTE — Telephone Encounter (Signed)
I called the patients insurance to check coverage and pre cert requirements for codes [phone] and [phone]. NPR requireed when done out patient Ref#[id number].

## 2019-03-26 NOTE — Telephone Encounter (Signed)
I called to check status of it and they marked it stat for me because it has not been worked.

## 2019-03-28 ENCOUNTER — Encounter: Payer: Self-pay | Admitting: Neurology

## 2019-03-28 ENCOUNTER — Ambulatory Visit (INDEPENDENT_AMBULATORY_CARE_PROVIDER_SITE_OTHER): Payer: BC Managed Care – PPO | Admitting: Neurology

## 2019-03-28 ENCOUNTER — Other Ambulatory Visit: Payer: Self-pay

## 2019-03-28 VITALS — BP 133/73 | HR 80 | Temp 99.3°F | Ht 69.5 in | Wt 305.5 lb

## 2019-03-28 DIAGNOSIS — G43709 Chronic migraine without aura, not intractable, without status migrainosus: Secondary | ICD-10-CM | POA: Diagnosis not present

## 2019-03-28 DIAGNOSIS — IMO0002 Reserved for concepts with insufficient information to code with codable children: Secondary | ICD-10-CM

## 2019-03-28 MED ORDER — ONABOTULINUMTOXINA 100 UNITS IJ SOLR
200.0000 [IU] | Freq: Once | INTRAMUSCULAR | Status: AC
  Administered 2019-03-28: 15:00:00 200 [IU] via INTRAMUSCULAR

## 2019-03-28 NOTE — Progress Notes (Signed)
**  Botox 200 units x 1 vial, [place] [id number], Lot [id number], Exp 11/2021, office supply.//mck,rn**
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
## 2019-04-04 ENCOUNTER — Ambulatory Visit (INDEPENDENT_AMBULATORY_CARE_PROVIDER_SITE_OTHER): Payer: BC Managed Care – PPO | Admitting: Internal Medicine

## 2019-04-04 ENCOUNTER — Other Ambulatory Visit: Payer: Self-pay

## 2019-04-04 ENCOUNTER — Encounter: Payer: Self-pay | Admitting: Internal Medicine

## 2019-04-04 VITALS — BP 120/68 | HR 63 | Temp 97.1°F | Ht 69.5 in | Wt 300.0 lb

## 2019-04-04 DIAGNOSIS — G4733 Obstructive sleep apnea (adult) (pediatric): Secondary | ICD-10-CM

## 2019-04-04 DIAGNOSIS — R0600 Dyspnea, unspecified: Secondary | ICD-10-CM

## 2019-04-04 DIAGNOSIS — R0689 Other abnormalities of breathing: Secondary | ICD-10-CM

## 2019-04-04 DIAGNOSIS — R918 Other nonspecific abnormal finding of lung field: Secondary | ICD-10-CM | POA: Diagnosis not present

## 2019-04-04 NOTE — Progress Notes (Signed)
OV 09/06/2017  Chief Complaint  Patient presents with  . Follow-up    CPST 2/27.  [facility] scheduled 09/12/17.  Pt does have some SOB. Denies any cough or CP.   Follow-up dyspnea and pulmonary nodules  She had cardiopulmonary stress test end of February 2019.  This showed desaturations during exercise.  I spoke to the technician Landis Martins and she categorically stated that desaturation got worse with exercise.  Therefore she has been set up with Dr. Haroldine Laws for a right heart catheterization later this month with a cardiology follow-up towards the end of the month.  At this point in time overall she is stable without any new problems.  Dyspnea on exertion persists.  Walking desaturation test 185 feet x3 laps on room air at normal pace submaximal exertion: Walking desaturation test on 09/06/2017 185 feet x 3 laps on ROOM AIR:  did get dyspenic and fatigued with 3 lap walking and did have mild  Desaturate to > 3% drop but not below 88%. Rest pulse ox was 97%, final pulse ox was 92%. HR response 73/min at rest to 90/min at peak exertion. Patient Jasmine [name]  Did not Desaturate < 88% . [name] yes did  Desaturated </= 3% points. Jerelyn Scott Napoles yes did get tachyardic      OV 04/04/2019  Subjective:  Patient ID: Jasmine [name], female , DOB: 1971/02/16 , age 48 y.o. , MRN: [id number] , ADDRESS: [place] [id number]   04/04/2019 -   Chief Complaint  Patient presents with  . Follow-up    Patient reports that she still has sob with exertion.    Morbidly obese with hypoxemia and shortness of breath  HPI GENEVIE [name] 48 y.o. -returns for follow-up.  Last seen in early 2019.  After that recommended a right heart catheterization for her unexplained hypoxemia but apparently at the she went to get the schedule with Dr. Haroldine Laws but her pregnancy test was false positive and this was canceled.  Since then she has not followed up.  She tells me overall since  then the shortness of breath is better.  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  She has a right [place] on.  But overall shortness of breath is better.  She has underlying history of sleep apnea but she has been lost to follow-up.  She does not wear her CPAP.  Her main reason for being here today is her multiple lung nodules.  I reviewed this in 2019 she had multiple lung nodules with the largest being 6 mm in the right upper lobe stable from 2018.  Radiologist that this was benign and no further follow-up recommended.  However patient tells me that she has a family history of cancer and she is requesting another CT scan.  Otherwise overall stable.     ROS - per HPI     has a past medical history of Anxiety, Balance problems, Bipolar disorder ([place]), Chronic fatigue, CKD (chronic kidney disease), stage II, Depression, Diabetes mellitus, DKA, type 1 ([place]) (11/04/2011), Elevated cholesterol, Fibromyalgia, GERD (gastroesophageal reflux disease), Headache, History of suicidal ideation, Hyperlipemia, Hypertension, Hypothyroidism, IBS (irritable bowel syndrome), Obesity, Sleep apnea, Stress incontinence, Tachycardia, Tobacco abuse, Tremor, and UTI (lower urinary tract infection).   reports that she quit smoking about 6 years ago. Her smoking use included cigarettes. She has a 15.00 pack-year smoking history. She has never used smokeless tobacco.  Past Surgical History:  Procedure Laterality Date  . INCONTINENCE SURGERY    . NASAL FRACTURE SURGERY    . ovary removed    . OVARY SURGERY    . PUBOVAGINAL SLING  08/16/2011   Procedure: [name];  Surgeon: Bernestine Amass, MD;  Location: [facility];  Service: Urology;  Laterality: N/A;         . UTERINE FIBROID SURGERY  2001    Allergies  Allergen Reactions  . Ciprofloxacin Swelling and Other (See Comments)    Per pt caused lips swell and nauseous feeling  . Levaquin [Levofloxacin] Swelling and Other (See Comments)     Per pt caused lips swell and nauseous feeling  . Buspar [Buspirone] Other (See Comments)    abd cramping  . Linaclotide Other (See Comments)  . Advair Diskus [Fluticasone-Salmeterol] Other (See Comments)    Thrush   . Biaxin [Clarithromycin] Rash  . Hydroxyzine Palpitations    Immunization History  Administered Date(s) Administered  . Influenza Split 04/07/2012, 04/07/2013  . Influenza,inj,Quad PF,6+ Mos 03/03/2009, 04/07/2012, 04/07/2013, 03/10/2016, 03/02/2017, 02/23/2018, 03/01/2019  . Influenza,inj,quad, With Preservative 03/05/2019  . Influenza-Unspecified 03/10/2016, 03/10/2017  . Pneumococcal Polysaccharide-23 04/08/2011, 03/02/2017  . Pneumococcal-Unspecified 04/08/2011, 03/10/2017  . Tdap 04/21/2016    Family History  Problem Relation Age of Onset  . Asthma Mother   . Bipolar disorder Mother   . Heart disease Father   . Lymphoma Father   . Hypertension Father   . Thyroid disease Father   . Hyperlipidemia Father   . Diabetes Father   . Cancer Paternal Grandmother        lung and breast  . Bladder Cancer Paternal Grandfather   . Suicidality Maternal Grandfather   . Thyroid disease Brother      Current Outpatient Medications:  .  aspirin EC 81 MG tablet, Take 81 mg by mouth at bedtime. , Disp: , Rfl:  .  botulinum toxin Type A (BOTOX) 100 units SOLR injection, Inject 200 Units into the muscle every 3 (three) months., Disp: 200 Units, Rfl: 3 .  buPROPion (WELLBUTRIN XL) 300 MG 24 hr tablet, Take 1 tablet (300 mg total) by mouth daily., Disp: 30 tablet, Rfl: 3 .  carvedilol (COREG) 25 MG tablet, Take 25 mg by mouth 2 (two) times daily with a meal., Disp: , Rfl:  .  Cholecalciferol (VITAMIN D3 SUPER STRENGTH) 50 MCG (2000 UT) TABS, Take 1 tablet by mouth daily. , Disp: , Rfl:  .  dicyclomine (BENTYL) 10 MG capsule, Take 10 mg by mouth 3 (three) times daily as needed for spasms., Disp: , Rfl:  .  DULoxetine (CYMBALTA) 60 MG capsule, Take 1 capsule (60 mg total) by  mouth at bedtime., Disp: 30 capsule, Rfl: 3 .  fluticasone (FLONASE) 50 MCG/ACT nasal spray, Place 2 sprays into the nose daily as needed for allergies. , Disp: , Rfl:  .  folic acid (FOLVITE) A999333 MCG tablet, Take 400 mcg by mouth every evening. , Disp: , Rfl:  .  furosemide (LASIX) 20 MG tablet, Take 20 mg by mouth daily. , Disp: , Rfl:  .  gabapentin (NEURONTIN) 100 MG capsule, Take 300 mg by mouth at bedtime., Disp: , Rfl:  .  HYDROcodone-acetaminophen (NORCO/VICODIN) 5-325 MG tablet, Take 1 tablet by mouth 2 (two) times daily as needed., Disp: , Rfl:  .  insulin glargine (LANTUS) 100 UNIT/ML injection, Inject 0.5 mLs (50 Units total) into the skin daily. (Patient taking differently: Inject 50 Units into the skin daily. Reports using Levemir -  only if pump is not working.), Disp: 10 mL, Rfl: 11 .  lamoTRIgine (LAMICTAL) 200 MG tablet, Take 1 tablet (200 mg total) by mouth daily., Disp: 30 tablet, Rfl: 3 .  levothyroxine (SYNTHROID, LEVOTHROID) 150 MCG tablet, Take 150 mcg by mouth daily before breakfast., Disp: , Rfl:  .  LORazepam (ATIVAN) 0.5 MG tablet, Take 1 tablet (0.5 mg total) by mouth 2 (two) times daily as needed for anxiety., Disp: 60 tablet, Rfl: 3 .  lubiprostone (AMITIZA) 8 MCG capsule, Take 8 mcg by mouth as needed for constipation. , Disp: , Rfl:  .  NOVOLOG 100 UNIT/ML injection, Inject 2.425-2.95 Units into the skin continuous. Via pump 7pm to midnight 2.950 8:30am -7pm2.925 Midnight - 8:30 2.425, Disp: , Rfl: 2 .  Omega-3 Fatty Acids (FISH OIL PO), Take 1 capsule by mouth daily., Disp: , Rfl:  .  omeprazole (PRILOSEC) 20 MG capsule, Take 20 mg by mouth at bedtime. , Disp: , Rfl:  .  ondansetron (ZOFRAN ODT) 4 MG disintegrating tablet, Take 1 tablet (4 mg total) by mouth every 8 (eight) hours as needed. (Patient taking differently: Take 4 mg by mouth every 8 (eight) hours as needed for nausea or vomiting. ), Disp: 20 tablet, Rfl: 6 .  pravastatin (PRAVACHOL) 80 MG tablet, Take 80  mg by mouth at bedtime., Disp: , Rfl:  .  PROAIR HFA 108 (90 Base) MCG/ACT inhaler, Inhale 1-2 puffs into the lungs every 6 (six) hours as needed for wheezing or shortness of breath. , Disp: , Rfl: 1 .  QUEtiapine (SEROQUEL) 25 MG tablet, Take 1 tablet (25 mg total) by mouth at bedtime., Disp: 30 tablet, Rfl: 3 .  rizatriptan (MAXALT-MLT) 10 MG disintegrating tablet, Take 1 tablet (10 mg total) as needed by mouth. May repeat in 2 hours if needed (Patient taking differently: Take 10 mg by mouth as needed for migraine. May repeat in 2 hours if needed), Disp: 15 tablet, Rfl: 11 .  traMADol (ULTRAM) 50 MG tablet, Take 50 mg by mouth 2 (two) times daily. , Disp: , Rfl:       Objective:   Vitals:   04/04/19 1149  BP: 120/68  Pulse: 63  Temp: (!) 97.1 F (36.2 C)  TempSrc: Temporal  SpO2: 96%  Weight: 300 lb (136.1 kg)  Height: 5' 9.5" (1.765 m)    Estimated body mass index is 43.67 kg/m as calculated from the following:   Height as of this encounter: 5' 9.5" (1.765 m).   Weight as of this encounter: 300 lb (136.1 kg).  @WEIGHTCHANGE @  [employer]   04/04/19 1149  Weight: 300 lb (136.1 kg)     Physical Exam  General Appearance:    Alert, cooperative, no distress, appears stated age - yes , Deconditioned looking - yes , OBESE  - yes, Sitting on Wheelchair -  no  Head:    Normocephalic, without obvious abnormality, atraumatic  Eyes:    PERRL, conjunctiva/corneas clear,  Ears:    Normal TM's and external ear canals, both ears  Nose:   Nares normal, septum midline, mucosa normal, no drainage    or sinus tenderness. OXYGEN ON  - no . Patient is @ ra   Throat:   Lips, mucosa, and tongue normal; teeth and gums normal. Cyanosis on lips - no  Neck:   Supple, symmetrical, trachea midline, no adenopathy;    thyroid:  no enlargement/tenderness/nodules; no carotid   bruit or JVD  Back:     Symmetric, no  curvature, ROM normal, no CVA tenderness  Lungs:     Distress - no , Wheeze no,  Barrell Chest - no, Purse lip breathing - no, Crackles - no   Chest Wall:    No tenderness or deformity.    Heart:    Regular rate and rhythm, S1 and S2 normal, no rub   or gallop, Murmur - no  Breast Exam:    NOT DONE  Abdomen:     Soft, non-tender, bowel sounds active all four quadrants,    no masses, no organomegaly. Visceral obesity - yes  Genitalia:   NOT DONE  Rectal:   NOT DONE  Extremities:   Extremities - normal, Has Cane - no, Clubbing - no, Edema - no RLE in BOOT  Pulses:   2+ and symmetric all extremities  Skin:   Stigmata of Connective Tissue Disease - no  Lymph nodes:   Cervical, supraclavicular, and axillary nodes normal  Psychiatric:  Neurologic:   Pleasant - yes, Anxious - no, Flat affect - yes  CAm-ICU - neg, Alert and Oriented x 3 - yes, Moves all 4s - yes, Speech - normal, Cognition - intact           Assessment:       ICD-10-CM   1. Multiple lung nodules on CT  R91.8   2. Dyspnea and respiratory abnormalities  R06.00    R06.89   3. OSA (obstructive sleep apnea)  G47.33        Plan:     Patient Instructions     ICD-10-CM   1. Multiple lung nodules on CT  R91.8   2. Dyspnea and respiratory abnormalities  R06.00    R06.89   3. OSA (obstructive sleep apnea)  G47.33     Shortness of breath: glad better   Sleep apnea:  - refer Dr Ander Slade or Elsworth Soho  - check abg next few to several weeks  Mutiple lung nodules - largest 75mm RUL in 2019 (stable from 2018) - at your request - do CT chest without contrast   Followup - - next few to several weeks but after getting abg and ct chest (ok to see APP)      SIGNATURE    Dr. Brand Males, M.D., F.C.C.P,  Pulmonary and Critical Care Medicine Staff Physician, [place] Director - Interstitial Lung Disease  Program  Pulmonary [place] [facility] [place], [place], [address]  Pager: [phone], If no answer or between  15:00h - 7:00h: call [phone] Telephone: [phone]  12:45 PM 04/04/2019

## 2019-04-04 NOTE — Patient Instructions (Signed)
ICD-10-CM   1. Multiple lung nodules on CT  R91.8   2. Dyspnea and respiratory abnormalities  R06.00    R06.89   3. OSA (obstructive sleep apnea)  G47.33     Shortness of breath: glad better   Sleep apnea:  - refer Dr Ander Slade or Elsworth Soho  - check abg next few to several weeks  Mutiple lung nodules - largest 46mm RUL in 2019 (stable from 2018) - at your request - do CT chest without contrast   Followup - - next few to several weeks but after getting abg and ct chest (ok to see APP)

## 2019-04-12 ENCOUNTER — Ambulatory Visit (INDEPENDENT_AMBULATORY_CARE_PROVIDER_SITE_OTHER): Payer: BC Managed Care – PPO | Admitting: Pulmonary Disease

## 2019-04-12 ENCOUNTER — Encounter: Payer: Self-pay | Admitting: Pulmonary Disease

## 2019-04-12 ENCOUNTER — Other Ambulatory Visit: Payer: Self-pay

## 2019-04-12 DIAGNOSIS — F3181 Bipolar II disorder: Secondary | ICD-10-CM

## 2019-04-12 DIAGNOSIS — G4733 Obstructive sleep apnea (adult) (pediatric): Secondary | ICD-10-CM

## 2019-04-12 NOTE — Patient Instructions (Signed)
You have moderate degree of sleep apnea, this is severe when you sleep on your back  Avoid sleeping on your back if possible. Weight loss would help. Reach out to your dentist to see if they can help with dental appliance for sleep apnea If you unable to do this, then call us back and we can schedule home sleep test with you wearing the dental device

## 2019-04-12 NOTE — Progress Notes (Signed)
   Subjective:    Patient ID: Jasmine [name], female    DOB: 14-Feb-1971, 48 y.o.   MRN: [id number]  HPI  Chief Complaint  Patient presents with  . Sleep Apnea    Based on office visit with Dr. Chase Caller 04/04/19. Per patient she was diagnosed with OSA, issued CPAP, but insurance will not cover machine.    48 year old obese woman with bipolar disorder presents for management of obstructive sleep apnea. PMH -bipolar, IDDM since age 6, CKD She was seen by me in 2018, home sleep test  Reviewed today which showed moderate OSA, severe in supine position.  She had been started on auto CPAP with full facemask, unfortunately she did not tolerate this well, will drop it off in the night.  Her compliance was noted to be poor and CPAP was discontinued by insurance.  She does not want to try this again.  She reports occasional lapses in memory but this has been ongoing for a while and she is not clear if this is related to her psychiatric issues.  She reports occasional naps but denies overwhelming sleepiness.  Epworth sleepiness score is 10 and I wonder if she is underreporting.  Bedtime is variable between midnight and 3 AM, medication that she takes at night makes her sleepy.  She is out of bed by 8 AM feeling tired with dryness of mouth or headaches Weight is unchanged compared to 2018.  Mild snoring has been noted by family members     Significant tests/ events reviewed  HST 07/2016 - moderate OSA with AHI 25/hour especially worse when supine with AHI 35/hour and lowest desaturation of 65%  Review of Systems neg for any significant sore throat, dysphagia, itching, sneezing, nasal congestion or excess/ purulent secretions, fever, chills, sweats, unintended wt loss, pleuritic or exertional cp, hempoptysis, orthopnea pnd or change in chronic leg swelling. Also denies presyncope, palpitations, heartburn, abdominal pain, nausea, vomiting, diarrhea or change in bowel or urinary habits,  dysuria,hematuria, rash, arthralgias, visual complaints, headache, numbness weakness or ataxia.     Objective:   Physical Exam   Gen. Pleasant, obese, in no distress ENT - no lesions, no post nasal drip Neck: No JVD, no thyromegaly, no carotid bruits Lungs: no use of accessory muscles, no dullness to percussion, decreased without rales or rhonchi  Cardiovascular: Rhythm regular, heart sounds  normal, no murmurs or gallops, no peripheral edema Musculoskeletal: No deformities, no cyanosis or clubbing , no tremors        Assessment & Plan:

## 2019-04-12 NOTE — Assessment & Plan Note (Addendum)
moderate degree of sleep apnea, this is severe when supine She absolutely is not interested in trying CPAP again -we had a long discussion about various measures which we could try to improve her cpap experience .  Unfortunately she would not be a candidate for hypoglossal nerve stimulation  Avoid sleeping on your back if possible -discussed measures to achieve this Weight loss would help. Reach out to your dentist to see if they can help with dental appliance for sleep apnea If you are able to do this, then call us back and we can schedule home sleep test with you wearing the dental device  Advised against medications with sedative side effects Cautioned against driving when sleepy - understanding that sleepiness will vary on a day to day basis

## 2019-04-12 NOTE — Assessment & Plan Note (Signed)
Clearly medication such as Seroquel, Lamictal, gabapentin and lorazepam would also contribute to her daytime sleepiness

## 2019-04-13 ENCOUNTER — Other Ambulatory Visit (HOSPITAL_COMMUNITY): Payer: BC Managed Care – PPO

## 2019-04-13 ENCOUNTER — Other Ambulatory Visit (HOSPITAL_COMMUNITY)
Admission: RE | Admit: 2019-04-13 | Discharge: 2019-04-13 | Disposition: A | Discharge status: Home / Self Care | Payer: BC Managed Care – PPO | Source: Ambulatory Visit | Attending: Internal Medicine | Admitting: Internal Medicine

## 2019-04-13 ENCOUNTER — Other Ambulatory Visit: Payer: Self-pay | Admitting: *Deleted

## 2019-04-13 DIAGNOSIS — Z01812 Encounter for preprocedural laboratory examination: Secondary | ICD-10-CM | POA: Insufficient documentation

## 2019-04-13 DIAGNOSIS — Z20828 Contact with and (suspected) exposure to other viral communicable diseases: Secondary | ICD-10-CM | POA: Diagnosis not present

## 2019-04-13 DIAGNOSIS — Z20822 Contact with and (suspected) exposure to covid-19: Secondary | ICD-10-CM

## 2019-04-13 LAB — SARS CORONAVIRUS 2 (TAT 6-24 HRS): SARS Coronavirus 2: NEGATIVE

## 2019-04-13 NOTE — Addendum Note (Signed)
Addended by: Naquita Nappier R on: 04/13/2019 03:02 PM   Modules accepted: Orders

## 2019-04-16 NOTE — Telephone Encounter (Signed)
Will call back closer to apt in Jan. DW

## 2019-04-16 NOTE — Telephone Encounter (Signed)
[facility] called in and left a voicemail and stated that they need a diagnosis code and insurance information to process botox order  CB# [phone]

## 2019-04-18 ENCOUNTER — Other Ambulatory Visit: Payer: Self-pay

## 2019-04-18 ENCOUNTER — Ambulatory Visit (HOSPITAL_COMMUNITY)
Admission: RE | Admit: 2019-04-18 | Discharge: 2019-04-18 | Disposition: A | Discharge status: Home / Self Care | Payer: BC Managed Care – PPO | Source: Ambulatory Visit | Attending: Internal Medicine | Admitting: Internal Medicine

## 2019-04-18 DIAGNOSIS — G4733 Obstructive sleep apnea (adult) (pediatric): Secondary | ICD-10-CM | POA: Insufficient documentation

## 2019-04-18 DIAGNOSIS — R0689 Other abnormalities of breathing: Secondary | ICD-10-CM | POA: Insufficient documentation

## 2019-04-18 DIAGNOSIS — R0600 Dyspnea, unspecified: Secondary | ICD-10-CM | POA: Diagnosis present

## 2019-04-18 LAB — BLOOD GAS, ARTERIAL
Acid-Base Excess: 3 mmol/L — ABNORMAL HIGH (ref 0.0–2.0)
Bicarbonate: 27.1 mmol/L (ref 20.0–28.0)
Drawn by: 21179
FIO2: 21
O2 Saturation: 94.9 %
Patient temperature: 37
pCO2 arterial: 42.9 mmHg (ref 32.0–48.0)
pH, Arterial: 7.417 (ref 7.350–7.450)
pO2, Arterial: 74.6 mmHg — ABNORMAL LOW (ref 83.0–108.0)

## 2019-04-18 LAB — PULMONARY FUNCTION TEST
DL/VA % pred: 104 %
DL/VA: 4.38 ml/min/mmHg/L
DLCO cor % pred: 74 %
DLCO cor: 18.7 ml/min/mmHg
DLCO unc % pred: 76 %
DLCO unc: 19.3 ml/min/mmHg
FEF 25-75 Post: 3.19 L/sec
FEF 25-75 Pre: 3.2 L/sec
FEF2575-%Change-Post: 0 %
FEF2575-%Pred-Post: 100 %
FEF2575-%Pred-Pre: 100 %
FEV1-%Change-Post: 0 %
FEV1-%Pred-Post: 79 %
FEV1-%Pred-Pre: 79 %
FEV1-Post: 2.67 L
FEV1-Pre: 2.69 L
FEV1FVC-%Change-Post: 3 %
FEV1FVC-%Pred-Pre: 108 %
FEV6-%Change-Post: -5 %
FEV6-%Pred-Post: 70 %
FEV6-%Pred-Pre: 74 %
FEV6-Post: 2.92 L
FEV6-Pre: 3.08 L
FEV6FVC-%Pred-Post: 102 %
FEV6FVC-%Pred-Pre: 102 %
FVC-%Change-Post: -4 %
FVC-%Pred-Post: 69 %
FVC-%Pred-Pre: 72 %
FVC-Post: 2.94 L
FVC-Pre: 3.08 L
Post FEV1/FVC ratio: 91 %
Post FEV6/FVC ratio: 100 %
Pre FEV1/FVC ratio: 87 %
Pre FEV6/FVC Ratio: 100 %
RV % pred: 83 %
RV: 1.65 L
TLC % pred: 82 %
TLC: 4.81 L

## 2019-04-18 MED ORDER — ALBUTEROL SULFATE (2.5 MG/3ML) 0.083% IN NEBU
2.5000 mg | INHALATION_SOLUTION | Freq: Once | RESPIRATORY_TRACT | Status: AC
  Administered 2019-04-18: 2.5 mg via RESPIRATORY_TRACT

## 2019-04-24 ENCOUNTER — Ambulatory Visit (HOSPITAL_COMMUNITY)
Admission: RE | Admit: 2019-04-24 | Discharge: 2019-04-24 | Disposition: A | Discharge status: Home / Self Care | Payer: BC Managed Care – PPO | Source: Ambulatory Visit | Attending: Internal Medicine | Admitting: Internal Medicine

## 2019-04-24 ENCOUNTER — Other Ambulatory Visit: Payer: Self-pay

## 2019-04-24 DIAGNOSIS — R0689 Other abnormalities of breathing: Secondary | ICD-10-CM | POA: Diagnosis present

## 2019-04-24 DIAGNOSIS — R0600 Dyspnea, unspecified: Secondary | ICD-10-CM | POA: Diagnosis present

## 2019-04-24 DIAGNOSIS — R918 Other nonspecific abnormal finding of lung field: Secondary | ICD-10-CM | POA: Diagnosis present

## 2019-04-28 ENCOUNTER — Emergency Department (HOSPITAL_COMMUNITY): Payer: BC Managed Care – PPO

## 2019-04-28 ENCOUNTER — Other Ambulatory Visit: Payer: Self-pay

## 2019-04-28 ENCOUNTER — Emergency Department (HOSPITAL_COMMUNITY)
Admission: EM | Admit: 2019-04-28 | Discharge: 2019-04-28 | Disposition: A | Discharge status: Home / Self Care | Payer: BC Managed Care – PPO | Attending: Emergency Medicine | Admitting: Emergency Medicine

## 2019-04-28 ENCOUNTER — Encounter (HOSPITAL_COMMUNITY): Payer: Self-pay | Admitting: Emergency Medicine

## 2019-04-28 DIAGNOSIS — Y999 Unspecified external cause status: Secondary | ICD-10-CM | POA: Diagnosis not present

## 2019-04-28 DIAGNOSIS — N184 Chronic kidney disease, stage 4 (severe): Secondary | ICD-10-CM | POA: Insufficient documentation

## 2019-04-28 DIAGNOSIS — Y929 Unspecified place or not applicable: Secondary | ICD-10-CM | POA: Insufficient documentation

## 2019-04-28 DIAGNOSIS — S8392XA Sprain of unspecified site of left knee, initial encounter: Secondary | ICD-10-CM | POA: Insufficient documentation

## 2019-04-28 DIAGNOSIS — M25462 Effusion, left knee: Secondary | ICD-10-CM | POA: Insufficient documentation

## 2019-04-28 DIAGNOSIS — E039 Hypothyroidism, unspecified: Secondary | ICD-10-CM | POA: Diagnosis not present

## 2019-04-28 DIAGNOSIS — W0110XA Fall on same level from slipping, tripping and stumbling with subsequent striking against unspecified object, initial encounter: Secondary | ICD-10-CM | POA: Insufficient documentation

## 2019-04-28 DIAGNOSIS — I129 Hypertensive chronic kidney disease with stage 1 through stage 4 chronic kidney disease, or unspecified chronic kidney disease: Secondary | ICD-10-CM | POA: Insufficient documentation

## 2019-04-28 DIAGNOSIS — Y939 Activity, unspecified: Secondary | ICD-10-CM | POA: Diagnosis not present

## 2019-04-28 DIAGNOSIS — Z87891 Personal history of nicotine dependence: Secondary | ICD-10-CM | POA: Insufficient documentation

## 2019-04-28 DIAGNOSIS — E1022 Type 1 diabetes mellitus with diabetic chronic kidney disease: Secondary | ICD-10-CM | POA: Insufficient documentation

## 2019-04-28 DIAGNOSIS — Z79899 Other long term (current) drug therapy: Secondary | ICD-10-CM | POA: Diagnosis not present

## 2019-04-28 DIAGNOSIS — M25562 Pain in left knee: Secondary | ICD-10-CM | POA: Diagnosis present

## 2019-04-28 DIAGNOSIS — E1042 Type 1 diabetes mellitus with diabetic polyneuropathy: Secondary | ICD-10-CM | POA: Diagnosis not present

## 2019-04-28 DIAGNOSIS — S86912A Strain of unspecified muscle(s) and tendon(s) at lower leg level, left leg, initial encounter: Secondary | ICD-10-CM

## 2019-04-28 DIAGNOSIS — Z7982 Long term (current) use of aspirin: Secondary | ICD-10-CM | POA: Insufficient documentation

## 2019-04-28 MED ORDER — HYDROCODONE-ACETAMINOPHEN 5-325 MG PO TABS: ORAL_TABLET | ORAL | 0 refills | Status: DC

## 2019-04-28 NOTE — ED Provider Notes (Signed)
[facility] EMERGENCY DEPARTMENT Provider Note   CSN: [id number] Arrival date & time: 04/28/19  1455     History   Chief Complaint Chief Complaint  Patient presents with   Knee Pain    HPI Jasmine [name] is a 48 y.o. female.  With a past medical history of obesity, diabetes, chronic left knee pain.  Patient states that last night she slipped and fell directly onto her left knee.  She has been able to toe-touch ambulate but states she has had significant pain.  She denies any new clicking, popping, catching or locking.  She states that she has crutches at home but she is "too uncoordinated to use them."  She has been taking Tylenol and applying ice to the affected area.     HPI  Past Medical History:  Diagnosis Date   Anxiety    Balance problems    Bipolar disorder ([place])    Chronic fatigue    CKD (chronic kidney disease), stage II    Depression    Diabetes mellitus    DKA, type 1 ([place]) 11/04/2011   Elevated cholesterol    Fibromyalgia    GERD (gastroesophageal reflux disease)    Headache    History of suicidal ideation    Hyperlipemia    Hypertension    Hypothyroidism    IBS (irritable bowel syndrome)    Obesity    Sleep apnea    HAS C -PAP / DOES NOT USE   Stress incontinence    Pt had surgery to correct this.   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   CKD (chronic kidney disease), stage IV ([place]) 07/01/2018   Transient Hypoglycemia 07/01/2018   Chronic migraine 01/17/2017   Diabetic peripheral neuropathy ([place])  01/17/2017   OSA (obstructive sleep apnea) 07/27/2016   Wheezing 07/27/2016   Hyperglycemia 12/25/2012   Acute on chronic renal failure ([place]) 12/25/2012   Pulmonary infiltrates 10/02/2012   Postnasal drip 10/02/2012   Hyperkalemia 09/25/2012   Morbid obesity ([place]) 09/24/2012   HTN (hypertension) 09/24/2012   Bipolar II disorder ([place]) 09/24/2012   URI (upper respiratory infection) 09/24/2012   DKA, type 1 ([place]) 11/04/2011   Gastroenteritis 11/03/2011   DM (diabetes mellitus), type 1, uncontrolled ([place]) 11/03/2011   Hyponatremia 11/03/2011   Elevated lipase 11/03/2011   Hypothyroidism 11/03/2011   Tobacco abuse 11/03/2011   SUI (stress urinary incontinence, female) 08/16/2011    Past Surgical History:  Procedure Laterality Date   INCONTINENCE SURGERY     NASAL FRACTURE SURGERY     ovary removed     OVARY SURGERY     PUBOVAGINAL SLING  08/16/2011   Procedure: [name];  Surgeon: Bernestine Amass, MD;  Location: [facility];  Service: Urology;  Laterality: N/A;          UTERINE FIBROID SURGERY  2001     OB History    Gravida  1   Para      Term      Preterm      AB      Living  SAB      TAB      Ectopic      Multiple      Live Births               Home Medications    Prior to Admission medications   Medication Sig Start Date End Date Taking? Authorizing Provider  aspirin EC 81 MG tablet Take 81 mg by mouth at bedtime.     [provider]  botulinum toxin Type A (BOTOX) 100 units SOLR injection Inject 200 Units into the muscle every 3 (three) months. 03/22/19   Marcial Pacas, MD  buPROPion (WELLBUTRIN XL) 300 MG 24 hr tablet Take 1 tablet (300 mg total) by mouth daily. 03/15/19   Norman Clay, MD  carvedilol (COREG) 25 MG tablet Take 25 mg by mouth 2 (two) times daily with a meal.    [provider]  Cholecalciferol (VITAMIN D3 SUPER STRENGTH) 50 MCG (2000 UT) TABS Take 1 tablet by mouth daily.   07/26/18   [provider]  dicyclomine (BENTYL) 10 MG capsule Take 10 mg by mouth 3 (three) times daily as needed for spasms.    [provider]  DULoxetine (CYMBALTA) 60 MG capsule Take 1 capsule (60 mg total) by mouth at bedtime. 03/15/19   Norman Clay, MD  fluticasone (FLONASE) 50 MCG/ACT nasal spray Place 2 sprays into the nose daily as needed for allergies.     [provider]  folic acid (FOLVITE) A999333 MCG tablet Take 400 mcg by mouth every evening.     [provider]  furosemide (LASIX) 20 MG tablet Take 20 mg by mouth daily.     [provider]  gabapentin (NEURONTIN) 100 MG capsule Take 300 mg by mouth at bedtime.    [provider]  HYDROcodone-acetaminophen (NORCO/VICODIN) 5-325 MG tablet Take 1 tablet by mouth 2 (two) times daily as needed. 04/03/19   [provider]  insulin glargine (LANTUS) 100 UNIT/ML injection Inject 0.5 mLs (50 Units total) into the skin daily. Patient taking differently: Inject 50 Units into the skin daily. Reports using Levemir - only if pump is not working. 09/04/18   Derrill Center, NP  lamoTRIgine (LAMICTAL) 200 MG tablet Take 1 tablet (200 mg total) by mouth daily. 03/15/19   Norman Clay, MD  levothyroxine (SYNTHROID, LEVOTHROID) 150 MCG tablet Take 150 mcg by mouth daily before breakfast.    [provider]  LORazepam (ATIVAN) 0.5 MG tablet Take 1 tablet (0.5 mg total) by mouth 2 (two) times daily as needed for anxiety. 03/15/19   Norman Clay, MD  lubiprostone (AMITIZA) 8 MCG capsule Take 8 mcg by mouth as needed for constipation.     [provider]  NOVOLOG 100 UNIT/ML injection Inject 2.425-2.95 Units into the skin continuous. Via pump 7pm to midnight 2.950 8:30am -7pm2.925 Midnight - 8:30 2.425 02/24/17   [provider]  Omega-3 Fatty Acids (FISH OIL PO) Take 1 capsule by mouth daily.    [provider]  omeprazole (PRILOSEC) 20 MG capsule Take 20 mg  by mouth at bedtime.     [provider]  ondansetron (ZOFRAN ODT) 4 MG disintegrating tablet Take 1 tablet (4 mg total) by mouth every 8 (eight) hours as needed. Patient taking differently: Take 4 mg by mouth every 8 (eight) hours as needed for nausea or vomiting.  01/17/17   Marcial Pacas, MD  pravastatin (PRAVACHOL) 80 MG tablet Take 80 mg by mouth at bedtime.  [provider]  PROAIR HFA [phone] Base) MCG/ACT inhaler Inhale 1-2 puffs into the lungs every 6 (six) hours as needed for wheezing or shortness of breath.  12/30/16   [provider]  QUEtiapine (SEROQUEL) 25 MG tablet Take 1 tablet (25 mg total) by mouth at bedtime. 03/15/19 07/13/19  Norman Clay, MD  rizatriptan (MAXALT-MLT) 10 MG disintegrating tablet Take 1 tablet (10 mg total) as needed by mouth. May repeat in 2 hours if needed Patient taking differently: Take 10 mg by mouth as needed for migraine. May repeat in 2 hours if needed 04/20/17   Marcial Pacas, MD  traMADol (ULTRAM) 50 MG tablet Take 50 mg by mouth 2 (two) times daily.  07/14/18   [provider]    Family History Family History  Problem Relation Age of Onset   Asthma Mother    Bipolar disorder Mother    Heart disease Father    Lymphoma Father    Hypertension Father    Thyroid disease Father    Hyperlipidemia Father    Diabetes Father    Cancer Paternal Grandmother        lung and breast   Bladder Cancer Paternal Grandfather    Suicidality Maternal Grandfather    Thyroid disease Brother     Social History Social History   Tobacco Use   Smoking status: Former Smoker    Packs/day: 0.75    Years: 20.00    Pack years: 15.00    Types: Cigarettes    Quit date: 06/07/2012    Years since quitting: 6.8   Smokeless tobacco: Never Used  Substance Use Topics   Alcohol use: No   Drug use: No     Allergies   Ciprofloxacin, Levaquin [levofloxacin], Buspar [buspirone], Linaclotide, Advair diskus  [fluticasone-salmeterol], Biaxin [clarithromycin], and Hydroxyzine   Review of Systems Review of Systems Ten systems reviewed and are negative for acute change, except as noted in the HPI.    Physical Exam Updated Vital Signs BP (!) 151/84    Pulse 70    Temp 97.8 F (36.6 C) (Oral)    Resp 18    Ht 5\' 9"  (1.753 m)    Wt 136.1 kg    LMP 03/23/2017 (Approximate)    SpO2 97%    BMI 44.30 kg/m   Physical Exam Vitals signs and nursing note reviewed.  Constitutional:      General: She is not in acute distress.    Appearance: She is well-developed. She is not diaphoretic.  HENT:     Head: Normocephalic and atraumatic.  Eyes:     General: No scleral icterus.    Conjunctiva/sclera: Conjunctivae normal.  Neck:     Musculoskeletal: Normal range of motion.  Cardiovascular:     Rate and Rhythm: Normal rate and regular rhythm.     Heart sounds: Normal heart sounds. No murmur. No friction rub. No gallop.   Pulmonary:     Effort: Pulmonary effort is normal. No respiratory distress.     Breath sounds: Normal breath sounds.  Abdominal:     General: Bowel sounds are normal. There is no distension.     Palpations: Abdomen is soft. There is no mass.     Tenderness: There is no abdominal tenderness. There is no guarding.  Musculoskeletal:        General: Swelling and tenderness present.     Left knee: She exhibits decreased range of motion, swelling and effusion. She exhibits no ecchymosis, no LCL laxity and no  MCL laxity. Tenderness found. Medial joint line and lateral joint line tenderness noted.  Skin:    General: Skin is warm and dry.  Neurological:     Mental Status: She is alert and oriented to person, place, and time.  Psychiatric:        Behavior: Behavior normal.      ED Treatments / Results  Labs (all labs ordered are listed, but only abnormal results are displayed) Labs Reviewed - No data to display  EKG None  Radiology Dg Knee Complete 4 Views Left  Result Date:  04/28/2019 CLINICAL DATA:  Knee pain EXAM: LEFT KNEE - COMPLETE 4+ VIEW COMPARISON:  None. FINDINGS: There is a large joint effusion. There is no definite acute displaced fracture or dislocation. There is slight irregularity of the lateral tibial spine without clear evidence for a displaced fracture. There are degenerative changes primarily within the lateral and patellofemoral compartments. There is soft tissue swelling about the knee. IMPRESSION: 1. Large joint effusion without evidence for a displaced fracture. If there is high clinical suspicion for internal derangement or an occult fracture follow-up with cross-sectional imaging is recommended. 2. Multicompartmental degenerative changes as detailed above. Electronically Signed   By: Constance Holster M.D.   On: 04/28/2019 16:01    Procedures Procedures (including critical care time)  Medications Ordered in ED Medications - No data to display   Initial Impression / Assessment and Plan / ED Course  I have reviewed the triage vital signs and the nursing notes.  Pertinent labs & imaging results that were available during my care of the patient were reviewed by me and considered in my medical decision making (see chart for details).        Patient with knee injury.  I personally reviewed the left knee plain film which showed a large joint effusion recommended potential CT scan.  CT scan reveals also a large joint effusion without evidence of tibial plateau fracture on my interpretation.  Patient given knee sleeve.  She has crutches at home.  Pain medications at discharge.  Discussed outpatient follow-up and return precautions.  Final Clinical Impressions(s) / ED Diagnoses   Final diagnoses:  None    ED Discharge Orders    None       Margarita Mail, PA-C 04/28/19 [id number]    Lucrezia Starch, MD 05/01/19 [phone]

## 2019-04-28 NOTE — Discharge Instructions (Signed)
Contact a health care provider if: °You have pain that gets worse. °The cast, brace, or splint does not fit right. °The cast, brace, or splint gets damaged. °Get help right away if: °You cannot use your injured joint to support any of your body weight (cannot bear weight). °You cannot move the injured joint. °You cannot walk more than a few steps without pain or without your knee buckling. °You have significant pain, swelling, or numbness below the cast, brace, or splint. °

## 2019-04-28 NOTE — ED Triage Notes (Signed)
Pt states her foot got tangled in blanket last night causing her to fall. Injury to left knee with difficulty walking.

## 2019-05-14 ENCOUNTER — Ambulatory Visit (INDEPENDENT_AMBULATORY_CARE_PROVIDER_SITE_OTHER): Payer: BC Managed Care – PPO | Admitting: Internal Medicine

## 2019-05-14 ENCOUNTER — Encounter: Payer: Self-pay | Admitting: Internal Medicine

## 2019-05-14 ENCOUNTER — Other Ambulatory Visit: Payer: Self-pay

## 2019-05-14 VITALS — BP 128/66 | HR 61 | Ht 69.0 in | Wt 309.6 lb

## 2019-05-14 DIAGNOSIS — R0600 Dyspnea, unspecified: Secondary | ICD-10-CM

## 2019-05-14 DIAGNOSIS — R918 Other nonspecific abnormal finding of lung field: Secondary | ICD-10-CM | POA: Diagnosis not present

## 2019-05-14 DIAGNOSIS — R0689 Other abnormalities of breathing: Secondary | ICD-10-CM

## 2019-05-14 NOTE — Progress Notes (Signed)
OV 09/06/2017  Chief Complaint  Patient presents with   Follow-up    CPST 2/27.  Jasmine [name] scheduled 09/12/17.  Pt does have some SOB. Denies any cough or CP.   Follow-up dyspnea and pulmonary nodules  She had cardiopulmonary stress test end of February 2019.  This showed desaturations during exercise.  I spoke to the technician Jasmine [name] and she categorically stated that desaturation got worse with exercise.  Therefore she has been set up with Dr. Haroldine [name] for a right heart catheterization later this month with a cardiology follow-up towards the end of the month.  At this point in time overall she is stable without any new problems.  Dyspnea on exertion persists.  Walking desaturation test 185 feet x3 laps on room air at normal pace submaximal exertion: Walking desaturation test on 09/06/2017 185 feet x 3 laps on ROOM AIR:  did get dyspenic and fatigued with 3 lap walking and did have mild  Desaturate to > 3% drop but not below 88%. Rest pulse ox was 97%, final pulse ox was 92%. HR response 73/min at rest to 90/min at peak exertion. Patient Jasmine [name]  Did not Desaturate < 88% . Jasmine [name] yes did  Desaturated </= 3% points. Jasmine [name] yes did get tachyardic      OV 04/04/2019  Subjective:  Patient ID: Jasmine [name], female , DOB: 24-Oct-1970 , age 70 y.o. , MRN: [id number] , ADDRESS: [name] [id number]   04/04/2019 -   Chief Complaint  Patient presents with   Follow-up    Patient reports that she still has sob with exertion.    Morbidly obese with hypoxemia and shortness of breath  HPI Jasmine [name] 48 y.o. -returns for follow-up.  Last seen in early 2019.  After that recommended a right heart catheterization for her unexplained hypoxemia but apparently at the she went to get the schedule with Dr. Haroldine [name] but her pregnancy test was false positive and this was canceled.  Since then she has not followed up.  She tells me overall since then  the shortness of breath is better.  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  She has a right [place] on.  But overall shortness of breath is better.  She has underlying history of sleep apnea but she has been lost to follow-up.  She does not wear her CPAP.  Her main reason for being here today is her multiple lung nodules.  I reviewed this in 2019 she had multiple lung nodules with the largest being 6 mm in the right upper lobe stable from 2018.  Radiologist that this was benign and no further follow-up recommended.  However patient tells me that she has a family history of cancer and she is requesting another CT scan.  Otherwise overall stable.    OV 05/14/2019  Subjective:  Patient ID: Jasmine [name], female , DOB: 06-03-1971 , age 49 y.o. , MRN: [id number] , ADDRESS: [name] [id number]   05/14/2019 -   Chief Complaint  Patient presents with   Follow-up    Pt is here today to go over recent test results.  Pt states she has been doing okay since last visit. States she has had some mild wheezing and an occ cough. Pt denies any increased SOB.   Morbidly obese female with sleep apnea and dyspnea \\Follow -up multiple pulmonary nodules  HPI Jasmine [name] 48 y.o. -  last visit was around end of October 2020.  At that time she wanted repeat CT scan for benign looking nodules because her brother had nodules that turned into cancer.  She had a CT scan of the chest done around mid November 2020.  The nodules are stable there are no new etiologies.  There is no other pathology.  She had a blood gas that looks normal.  She also saw Dr. Elsworth [name] in the interim and is diagnosed with moderate sleep apnea and severe sleep apnea when she lies on her back.  She said she does not want to wear CPAP and she and he have come to a shared decision making that she would avoid sleeping on her back which she is doing.  She think she will now wear her CPAP.  At this point in time her  dyspnea is improved compared to a year ago and it is only mild.  Is not a pressing issue for her.  There is no wheezing or cough.    ROS - per HPI     has a past medical history of Anxiety, Balance problems, Bipolar disorder ([place]), Chronic fatigue, CKD (chronic kidney disease), stage II, Depression, Diabetes mellitus, DKA, type 1 ([place]) (11/04/2011), Elevated cholesterol, Fibromyalgia, GERD (gastroesophageal reflux disease), Headache, History of suicidal ideation, Hyperlipemia, Hypertension, Hypothyroidism, IBS (irritable bowel syndrome), Obesity, Sleep apnea, Stress incontinence, Tachycardia, Tobacco abuse, Tremor, and UTI (lower urinary tract infection).   reports that she quit smoking about 6 years ago. Her smoking use included cigarettes. She has a 15.00 pack-year smoking history. She has never used smokeless tobacco.  Past Surgical History:  Procedure Laterality Date   INCONTINENCE SURGERY     NASAL FRACTURE SURGERY     ovary removed     OVARY SURGERY     PUBOVAGINAL SLING  08/16/2011   Procedure: [name];  Surgeon: Jasmine Amass, MD;  Location: [facility];  Service: Urology;  Laterality: N/A;          UTERINE FIBROID SURGERY  2001    Allergies  Allergen Reactions   Ciprofloxacin Swelling and Other (See Comments)    Per pt caused lips swell and nauseous feeling   Levaquin [Levofloxacin] Swelling and Other (See Comments)    Per pt caused lips swell and nauseous feeling   Buspar [Buspirone] Other (See Comments)    abd cramping   Linaclotide Other (See Comments)   Advair Diskus [Fluticasone-Salmeterol] Other (See Comments)    Thrush    Biaxin [Clarithromycin] Rash   Hydroxyzine Palpitations    Immunization History  Administered Date(s) Administered   Influenza Split 04/07/2012, 04/07/2013   Influenza,inj,Quad PF,6+ Mos 03/03/2009, 04/07/2012, 04/07/2013, 03/10/2016, 03/02/2017, 02/23/2018, 03/01/2019   Influenza,inj,quad, With Preservative  03/05/2019   Influenza-Unspecified 03/10/2016, 03/10/2017   Pneumococcal Polysaccharide-23 04/08/2011, 03/02/2017   Pneumococcal-Unspecified 04/08/2011, 03/10/2017   Tdap 04/21/2016    Family History  Problem Relation Age of Onset   Asthma Mother    Bipolar disorder Mother    Heart disease Father    Lymphoma Father    Hypertension Father    Thyroid disease Father    Hyperlipidemia Father    Diabetes Father    Cancer Paternal Grandmother        lung and breast   Bladder Cancer Paternal Grandfather    Suicidality Maternal Grandfather    Thyroid disease Brother      Current Outpatient Medications:    aspirin EC 81 MG tablet, Take 81 mg by mouth at bedtime. ,  Disp: , Rfl:    botulinum toxin Type A (BOTOX) 100 units SOLR injection, Inject 200 Units into the muscle every 3 (three) months., Disp: 200 Units, Rfl: 3   buPROPion (WELLBUTRIN XL) 300 MG 24 hr tablet, Take 1 tablet (300 mg total) by mouth daily., Disp: 30 tablet, Rfl: 3   carvedilol (COREG) 25 MG tablet, Take 25 mg by mouth 2 (two) times daily with a meal., Disp: , Rfl:    Cholecalciferol (VITAMIN D3 SUPER STRENGTH) 50 MCG (2000 UT) TABS, Take 1 tablet by mouth daily. , Disp: , Rfl:    Coenzyme Q10 (COQ-10 PO), Take 1 capsule by mouth daily., Disp: , Rfl:    dicyclomine (BENTYL) 10 MG capsule, Take 10 mg by mouth 3 (three) times daily as needed for spasms., Disp: , Rfl:    DULoxetine (CYMBALTA) 60 MG capsule, Take 1 capsule (60 mg total) by mouth at bedtime., Disp: 30 capsule, Rfl: 3   fluticasone (FLONASE) 50 MCG/ACT nasal spray, Place 2 sprays into the nose daily as needed for allergies. , Disp: , Rfl:    folic acid (FOLVITE) A999333 MCG tablet, Take 400 mcg by mouth every evening. , Disp: , Rfl:    furosemide (LASIX) 20 MG tablet, Take 20 mg by mouth daily. , Disp: , Rfl:    gabapentin (NEURONTIN) 100 MG capsule, Take 300 mg by mouth at bedtime., Disp: , Rfl:    HYDROcodone-acetaminophen  (NORCO/VICODIN) 5-325 MG tablet, Take one tab po q 4 hrs prn pain, Disp: 6 tablet, Rfl: 0   lamoTRIgine (LAMICTAL) 200 MG tablet, Take 1 tablet (200 mg total) by mouth daily., Disp: 30 tablet, Rfl: 3   levothyroxine (SYNTHROID, LEVOTHROID) 150 MCG tablet, Take 150 mcg by mouth daily before breakfast., Disp: , Rfl:    LORazepam (ATIVAN) 0.5 MG tablet, Take 1 tablet (0.5 mg total) by mouth 2 (two) times daily as needed for anxiety., Disp: 60 tablet, Rfl: 3   lubiprostone (AMITIZA) 8 MCG capsule, Take 8 mcg by mouth as needed for constipation. , Disp: , Rfl:    NOVOLOG 100 UNIT/ML injection, Inject 2.425-2.95 Units into the skin continuous. Via pump 7pm to midnight 2.950 8:30am -7pm2.925 Midnight - 8:30 2.425, Disp: , Rfl: 2   omeprazole (PRILOSEC) 20 MG capsule, Take 20 mg by mouth at bedtime. , Disp: , Rfl:    ondansetron (ZOFRAN ODT) 4 MG disintegrating tablet, Take 1 tablet (4 mg total) by mouth every 8 (eight) hours as needed. (Patient taking differently: Take 4 mg by mouth every 8 (eight) hours as needed for nausea or vomiting. ), Disp: 20 tablet, Rfl: 6   pravastatin (PRAVACHOL) 80 MG tablet, Take 80 mg by mouth at bedtime., Disp: , Rfl:    PROAIR HFA 108 (90 Base) MCG/ACT inhaler, Inhale 1-2 puffs into the lungs every 6 (six) hours as needed for wheezing or shortness of breath. , Disp: , Rfl: 1   QUEtiapine (SEROQUEL) 25 MG tablet, Take 1 tablet (25 mg total) by mouth at bedtime., Disp: 30 tablet, Rfl: 3   traMADol (ULTRAM) 50 MG tablet, Take 50 mg by mouth 2 (two) times daily. , Disp: , Rfl:    insulin glargine (LANTUS) 100 UNIT/ML injection, Inject 0.5 mLs (50 Units total) into the skin daily. (Patient not taking: Reported on 05/14/2019), Disp: 10 mL, Rfl: 11   Omega-3 Fatty Acids (FISH OIL PO), Take 1 capsule by mouth daily., Disp: , Rfl:    rizatriptan (MAXALT-MLT) 10 MG disintegrating tablet, Take 1 tablet (  10 mg total) as needed by mouth. May repeat in 2 hours if needed (Patient  not taking: Reported on 05/14/2019), Disp: 15 tablet, Rfl: 11      Objective:   Vitals:   05/14/19 1023  BP: 128/66  Pulse: 61  SpO2: 96%  Weight: (!) 309 lb 9.6 oz (140.4 kg)  Height: 5\' 9"  (1.753 m)    Estimated body mass index is 45.72 kg/m as calculated from the following:   Height as of this encounter: 5\' 9"  (1.753 m).   Weight as of this encounter: 309 lb 9.6 oz (140.4 kg).  @WEIGHTCHANGE @  Filed Weights   05/14/19 1023  Weight: (!) 309 lb 9.6 oz (140.4 kg)     Physical Exam Focused exam shows obese woman sitting with a mask.  Lungs are clear to auscultation heart sounds are normal.  There is no chronic edema.  Extremities look intact alert and oriented x3.        Assessment:       ICD-10-CM   1. Multiple lung nodules on CT  R91.8   2. Dyspnea and respiratory abnormalities  R06.00    R06.89        Plan:     Patient Instructions     ICD-10-CM   1. Multiple lung nodules on CT  R91.8   2. Dyspnea and respiratory abnormalities  R06.00    R06.89   3. OSA (obstructive sleep apnea)  G47.33     Shortness of breath: glad better. Likely due to weight. No active followup   Sleep apnea:  -rspect reluctance to wear CPAP - followup per Dr Jasmine [name]  Mutiple lung nodules - largest 28mm RUL in 2019 (stable from 2018) -> stable 2020 - no furhter followup  Followup - - with Dr Jasmine [name] for sleep apnea  - no further followup with Dr Chase Caller unless something changes or you need to      SIGNATURE    Dr. Brand Males, M.D., F.C.C.P,  Pulmonary and Critical Care Medicine Staff Physician, Circle Director - Interstitial Lung Disease  Program  Pulmonary [place] at [place], [place], [address]  Pager: [phone], If no answer or between  15:00h - 7:00h: call [phone] Telephone: [phone]  11:04 AM 05/14/2019

## 2019-05-14 NOTE — Patient Instructions (Signed)
ICD-10-CM   1. Multiple lung nodules on CT  R91.8   2. Dyspnea and respiratory abnormalities  R06.00    R06.89   3. OSA (obstructive sleep apnea)  G47.33     Shortness of breath: glad better. Likely due to weight. No active followup   Sleep apnea:  -rspect reluctance to wear CPAP - followup per Dr Elsworth Soho  Mutiple lung nodules - largest 64mm RUL in 2019 (stable from 2018) -> stable 2020 - no furhter followup  Followup - - with Dr Elsworth Soho for sleep apnea  - no further followup with Dr Chase Caller unless something changes or you need to

## 2019-05-15 ENCOUNTER — Ambulatory Visit: Payer: BC Managed Care – PPO | Admitting: Psychiatry

## 2019-05-15 ENCOUNTER — Ambulatory Visit (HOSPITAL_COMMUNITY): Payer: BC Managed Care – PPO | Admitting: Psychiatry

## 2019-05-16 ENCOUNTER — Ambulatory Visit (INDEPENDENT_AMBULATORY_CARE_PROVIDER_SITE_OTHER): Payer: BC Managed Care – PPO | Admitting: Psychiatry

## 2019-05-16 ENCOUNTER — Other Ambulatory Visit: Payer: Self-pay

## 2019-05-16 ENCOUNTER — Encounter: Payer: Self-pay | Admitting: Psychiatry

## 2019-05-16 DIAGNOSIS — F3181 Bipolar II disorder: Secondary | ICD-10-CM

## 2019-05-16 MED ORDER — LORAZEPAM 0.5 MG PO TABS: 0.5000 mg | ORAL_TABLET | Freq: Two times a day (BID) | ORAL | 1 refills | Status: DC | PRN

## 2019-05-16 MED ORDER — LAMOTRIGINE 200 MG PO TABS: 200.0000 mg | ORAL_TABLET | Freq: Every day | ORAL | 1 refills | Status: DC

## 2019-05-16 MED ORDER — BUPROPION HCL ER (XL) 300 MG PO TB24: 300.0000 mg | ORAL_TABLET | Freq: Every day | ORAL | 1 refills | Status: DC

## 2019-05-16 MED ORDER — DULOXETINE HCL 60 MG PO CPEP: 60.0000 mg | ORAL_CAPSULE | Freq: Two times a day (BID) | ORAL | 1 refills | Status: DC

## 2019-05-16 NOTE — Progress Notes (Signed)
[facility] MD OP Progress Note  I connected with  Jasmine [name] on 05/16/19 by a video enabled telemedicine application and verified that I am speaking with the correct person using two identifiers.   I discussed the limitations of evaluation and management by telemedicine. The patient expressed understanding and agreed to proceed.   05/16/2019 11:53 AM Jasmine [name]  MRN:  [id number]  Chief Complaint:  " I am not doing so well."  HPI: Patient reported that she has been feeling depressed with poor energy levels.  She spends most of her time sleeping or watching TV in the bed.  She feels like she has no motivation to do anything.  She informed that her husband does not listen to her and understand her feelings.  She also reported feeling overwhelmed as her grandmother is very sick and on her death bed in the hospital.  She also informed that her brother is very sick with cancer and her father also has been diagnosed with cancer but is in remission for now. She stated that she has had some passive suicidal thoughts but she distracts herself and denied any intent to hurt herself.  Patient was strongly encouraged to call 911 in case she has suicidal thoughts or was encouraged to talk to her father who lives with her. In the past Dr. Modesta Messing has provided her with the number for ECT services.  Patient was noted to be wearing a glucose monitor on her forearm. A laundry basket of items was noted in the background. She was also observed to be drinking [facility] during the session.   She informed that her blood glucose levels have been above 400 on few occasions in the past few weeks.  She also reported that she did not sleep much at night as she takes a lot of long naps during the day. Patient also informed that she did not find Seroquel to be helpful.  Visit Diagnosis:    ICD-10-CM   1. Bipolar 2 disorder, major depressive episode ([place])  F31.81     Past Psychiatric History: mood disorder  Past  Medical History:  Past Medical History:  Diagnosis Date  . Anxiety   . Balance problems   . Bipolar disorder ([place])   . Chronic fatigue   . CKD (chronic kidney disease), stage II   . Depression   . Diabetes mellitus   . DKA, type 1 ([place]) 11/04/2011  . Elevated cholesterol   . Fibromyalgia   . GERD (gastroesophageal reflux disease)   . Headache   . History of suicidal ideation   . Hyperlipemia   . Hypertension   . Hypothyroidism   . IBS (irritable bowel syndrome)   . Obesity   . Sleep apnea    HAS C -PAP / DOES NOT USE  . Stress incontinence    Pt had surgery to correct this.  . Tachycardia   . Tobacco abuse   . Tremor   . UTI (lower urinary tract infection)     Past Surgical History:  Procedure Laterality Date  . INCONTINENCE SURGERY    . NASAL FRACTURE SURGERY    . ovary removed    . OVARY SURGERY    . PUBOVAGINAL SLING  08/16/2011   Procedure: [name];  Surgeon: Bernestine Amass, MD;  Location: [facility];  Service: Urology;  Laterality: N/A;         . UTERINE FIBROID SURGERY  2001    Family Psychiatric History: see below  Family History:  Family History  Problem Relation Age of Onset  . Asthma Mother   . Bipolar disorder Mother   . Heart disease Father   . Lymphoma Father   . Hypertension Father   . Thyroid disease Father   . Hyperlipidemia Father   . Diabetes Father   . Cancer Paternal Grandmother        lung and breast  . Bladder Cancer Paternal Grandfather   . Suicidality Maternal Grandfather   . Thyroid disease Brother     Social History:  Social History   Socioeconomic History  . Marital status: Married    Spouse name: Not on file  . Number of children: 0  . Years of education: 85  . Highest education level: Not on file  Occupational History  . Occupation: Disabled  Social Needs  . Financial resource strain: Not hard at all  . Food insecurity    Worry: Never true    Inability: Never true  . Transportation needs    Medical:  No    Non-medical: No  Tobacco Use  . Smoking status: Former Smoker    Packs/day: 0.75    Years: 20.00    Pack years: 15.00    Types: Cigarettes    Quit date: 06/07/2012    Years since quitting: 6.9  . Smokeless tobacco: Never Used  Substance and Sexual Activity  . Alcohol use: No  . Drug use: No  . Sexual activity: Yes    Birth control/protection: Post-menopausal  Lifestyle  . Physical activity    Days per week: 0 days    Minutes per session: 0 min  . Stress: Very much  Relationships  . Social Herbalist on phone: Twice a week    Gets together: More than three times a week    Attends religious service: More than 4 times per year    Active member of club or organization: Yes    Attends meetings of clubs or organizations: More than 4 times per year    Relationship status: Married  Other Topics Concern  . Not on file  Social History Narrative   Lives at home with husband.   Right-handed.   Occasional caffeine use.    Allergies:  Allergies  Allergen Reactions  . Ciprofloxacin Swelling and Other (See Comments)    Per pt caused lips swell and nauseous feeling  . Levaquin [Levofloxacin] Swelling and Other (See Comments)    Per pt caused lips swell and nauseous feeling  . Buspar [Buspirone] Other (See Comments)    abd cramping  . Linaclotide Other (See Comments)  . Advair Diskus [Fluticasone-Salmeterol] Other (See Comments)    Thrush   . Biaxin [Clarithromycin] Rash  . Hydroxyzine Palpitations    Metabolic Disorder Labs: Lab Results  Component Value Date   HGBA1C 8.0 (H) 08/31/2018   MPG 182.9 08/31/2018   MPG 229 (H) 12/25/2012   No results found for: PROLACTIN Lab Results  Component Value Date   CHOL 163 08/31/2018   TRIG 108 08/31/2018   HDL 62 08/31/2018   CHOLHDL 2.6 08/31/2018   VLDL 22 08/31/2018   LDLCALC 79 08/31/2018   Lab Results  Component Value Date   TSH 0.879 08/31/2018   TSH 2.169 07/11/2018    Therapeutic Level Labs: No  results found for: LITHIUM No results found for: VALPROATE No components found for:  CBMZ  Current Medications: Current Outpatient Medications  Medication Sig Dispense Refill  . aspirin EC 81 MG tablet Take 81  mg by mouth at bedtime.     . botulinum toxin Type A (BOTOX) 100 units SOLR injection Inject 200 Units into the muscle every 3 (three) months. 200 Units 3  . buPROPion (WELLBUTRIN XL) 300 MG 24 hr tablet Take 1 tablet (300 mg total) by mouth daily. 30 tablet 3  . carvedilol (COREG) 25 MG tablet Take 25 mg by mouth 2 (two) times daily with a meal.    . Cholecalciferol (VITAMIN D3 SUPER STRENGTH) 50 MCG (2000 UT) TABS Take 1 tablet by mouth daily.     . Coenzyme Q10 (COQ-10 PO) Take 1 capsule by mouth daily.    Marland Kitchen dicyclomine (BENTYL) 10 MG capsule Take 10 mg by mouth 3 (three) times daily as needed for spasms.    . DULoxetine (CYMBALTA) 60 MG capsule Take 1 capsule (60 mg total) by mouth at bedtime. 30 capsule 3  . fluticasone (FLONASE) 50 MCG/ACT nasal spray Place 2 sprays into the nose daily as needed for allergies.     . folic acid (FOLVITE) A999333 MCG tablet Take 400 mcg by mouth every evening.     . furosemide (LASIX) 20 MG tablet Take 20 mg by mouth daily.     Marland Kitchen gabapentin (NEURONTIN) 100 MG capsule Take 300 mg by mouth at bedtime.    Marland Kitchen HYDROcodone-acetaminophen (NORCO/VICODIN) 5-325 MG tablet Take one tab po q 4 hrs prn pain 6 tablet 0  . insulin glargine (LANTUS) 100 UNIT/ML injection Inject 0.5 mLs (50 Units total) into the skin daily. (Patient not taking: Reported on 05/14/2019) 10 mL 11  . lamoTRIgine (LAMICTAL) 200 MG tablet Take 1 tablet (200 mg total) by mouth daily. 30 tablet 3  . levothyroxine (SYNTHROID, LEVOTHROID) 150 MCG tablet Take 150 mcg by mouth daily before breakfast.    . LORazepam (ATIVAN) 0.5 MG tablet Take 1 tablet (0.5 mg total) by mouth 2 (two) times daily as needed for anxiety. 60 tablet 3  . lubiprostone (AMITIZA) 8 MCG capsule Take 8 mcg by mouth as needed  for constipation.     Marland Kitchen NOVOLOG 100 UNIT/ML injection Inject 2.425-2.95 Units into the skin continuous. Via pump 7pm to midnight 2.950 8:30am -7pm2.925 Midnight - 8:30 2.425  2  . Omega-3 Fatty Acids (FISH OIL PO) Take 1 capsule by mouth daily.    Marland Kitchen omeprazole (PRILOSEC) 20 MG capsule Take 20 mg by mouth at bedtime.     . ondansetron (ZOFRAN ODT) 4 MG disintegrating tablet Take 1 tablet (4 mg total) by mouth every 8 (eight) hours as needed. (Patient taking differently: Take 4 mg by mouth every 8 (eight) hours as needed for nausea or vomiting. ) 20 tablet 6  . pravastatin (PRAVACHOL) 80 MG tablet Take 80 mg by mouth at bedtime.    Marland Kitchen PROAIR HFA 108 (90 Base) MCG/ACT inhaler Inhale 1-2 puffs into the lungs every 6 (six) hours as needed for wheezing or shortness of breath.   1  . QUEtiapine (SEROQUEL) 25 MG tablet Take 1 tablet (25 mg total) by mouth at bedtime. 30 tablet 3  . rizatriptan (MAXALT-MLT) 10 MG disintegrating tablet Take 1 tablet (10 mg total) as needed by mouth. May repeat in 2 hours if needed (Patient not taking: Reported on 05/14/2019) 15 tablet 11  . traMADol (ULTRAM) 50 MG tablet Take 50 mg by mouth 2 (two) times daily.      No current facility-administered medications for this visit.      Musculoskeletal: Strength & Muscle Tone: unable to assess due  to telemed visit Gait & Station: unable to assess due to telemed visit Patient leans: unable to assess due to telemed visit  Psychiatric Specialty Exam: ROS  Last menstrual period 03/23/2017.There is no height or weight on file to calculate BMI.  General Appearance: Fairly Groomed  Eye Contact:  Good  Speech:  Clear and Coherent and Normal Rate  Volume:  Normal  Mood:  Depressed  Affect:  Congruent  Thought Process:  Goal Directed, Linear and Descriptions of Associations: Intact  Orientation:  Full (Time, Place, and Person)  Thought Content: Logical   Suicidal Thoughts:  No  Homicidal Thoughts:  No  Memory:  Recent;    Good Remote;   Good  Judgement:  Fair  Insight:  Fair  Psychomotor Activity:  Normal  Concentration:  Concentration: Good and Attention Span: Good  Recall:  Good  Fund of Knowledge: Good  Language: Good  Akathisia:  Negative  Handed:  Right  AIMS (if indicated): not done  Assets:  Communication Skills Desire for Improvement Financial Resources/Insurance Social Support  ADL's:  Intact  Cognition: WNL  Sleep:  Fair   Screenings: AIMS     Admission (Discharged) from 08/30/2018 in [facility] ED to Hosp-Admission (Discharged) from 07/10/2018 in [facility]  AIMS Total Score  0  0    AUDIT     Admission (Discharged) from 08/30/2018 in [facility] [facility] ED to Hosp-Admission (Discharged) from 07/10/2018 in [facility]  Alcohol Use Disorder Identification Test Final Score (AUDIT)  0  0    PHQ2-9     Counselor from 02/20/2019 in [facility] Counselor from 01/23/2019 in [facility] [facility]  PHQ-2 Total Score  2  5  PHQ-9 Total Score  22  25       Assessment and Plan: 48 year old female with history of bipolar disorder not doing well currently.  She is denying any active suicidal ideations.  She is agreeable to increasing the dose of Cymbalta for optimal effect.  Will discontinue Seroquel due to lack of effectiveness.  It was noted that patient is taking tramadol for pain and the possibility of risk of serotonin syndrome with combination of Cymbalta with tramadol as discussed.  Will monitor for that.  1. Bipolar 2 disorder, major depressive episode ([facility]) - buPROPion (WELLBUTRIN XL) 300 MG 24 hr tablet; Take 1 tablet (300 mg total) by mouth daily.  Dispense: 30 tablet; Refill: 1 - Increase DULoxetine (CYMBALTA) 60 MG capsule; Take 1 capsule (60 mg total) by mouth 2 (two) times daily.  Dispense: 60  capsule; Refill: 1 - lamoTRIgine (LAMICTAL) 200 MG tablet; Take 1 tablet (200 mg total) by mouth daily.  Dispense: 30 tablet; Refill: 1 - LORazepam (ATIVAN) 0.5 MG tablet; Take 1 tablet (0.5 mg total) by mouth 2 (two) times daily as needed for anxiety.  Dispense: 60 tablet; Refill: 1 - Discontinue Seroquel due to lack of efficacy.  Patient was encouraged to call 911 in case of active suicidal ideations.  She was also encouraged to talk to her father who lives with her. Patient was encouraged to eat healthy and make right dietary options so that she can keep her blood sugars under control.  F/up in 4 weeks for close monitoring.  [place] Crane, MD 05/16/2019, 11:53 AM

## 2019-06-19 ENCOUNTER — Encounter: Payer: Self-pay | Admitting: Psychiatry

## 2019-06-19 ENCOUNTER — Ambulatory Visit (INDEPENDENT_AMBULATORY_CARE_PROVIDER_SITE_OTHER): Payer: BC Managed Care – PPO | Admitting: Psychiatry

## 2019-06-19 ENCOUNTER — Other Ambulatory Visit: Payer: Self-pay

## 2019-06-19 DIAGNOSIS — Z635 Disruption of family by separation and divorce: Secondary | ICD-10-CM | POA: Diagnosis not present

## 2019-06-19 DIAGNOSIS — F3181 Bipolar II disorder: Secondary | ICD-10-CM | POA: Diagnosis not present

## 2019-06-19 MED ORDER — LORAZEPAM 0.5 MG PO TABS: 0.5000 mg | ORAL_TABLET | Freq: Two times a day (BID) | ORAL | 1 refills | Status: DC | PRN

## 2019-06-19 MED ORDER — BUPROPION HCL ER (XL) 300 MG PO TB24: 300.0000 mg | ORAL_TABLET | Freq: Every day | ORAL | 1 refills | Status: DC

## 2019-06-19 MED ORDER — LAMOTRIGINE 200 MG PO TABS: 200.0000 mg | ORAL_TABLET | Freq: Every day | ORAL | 1 refills | Status: DC

## 2019-06-19 MED ORDER — DULOXETINE HCL 60 MG PO CPEP: 60.0000 mg | ORAL_CAPSULE | Freq: Two times a day (BID) | ORAL | 1 refills | Status: DC

## 2019-06-19 NOTE — Progress Notes (Signed)
[facility] OP Progress Note  I connected with  Jasmine [name] on 06/19/19 by a video enabled telemedicine application and verified that I am speaking with the correct person using two identifiers.   I discussed the limitations of evaluation and management by telemedicine. The patient expressed understanding and agreed to proceed.   06/19/2019 11:21 AM Jasmine [name]  MRN:  [id number]  Chief Complaint:  " I am the same."  HPI: Patient reported that she could tell a very small difference after the dose of Cymbalta was increased last time.  She is not sure if the Wellbutrin helps much however she knows that Lamictal does help as when she has accidentally missed a dose here and there she could tell that she could not "stand myself."  She informed that her father is doing better however her brother is still in the terminal stages of cancer.  She also informed that she had to put down her dog that just over a-year-old.  She also informed that she recently found out that her 69 year old grandmother has colon cancer she knows that nobody is going to treat her aggressively.  She stated that it is one thing after another. Regarding her relationship with her husband, she stated that she continues to be cold towards her and prefers to be by himself.  She informed that on Christmas eve she had like her father and her brother and nephews and while they were here he remained in his room after saying he was not feeling well.  The next day when they had company over he did the same thing. However he was absolutely fine she and him went to visit his mother in her house.  She stated that she wants the relationship between them to improve but she cannot do anything unless he wants the same. She reported a poor relationship with her husband as the biggest stressor in her life. She denied having any recent suicidal ideations. She did state that she was able to enjoy herself when she was in the company of her brother,  father and nephews and she also had a good time when she went to visit her mother-in-law at her place. She stated that she used to discuss her relationship with her father-in-law before he passed away.  She does not think her mother in law will do much as she is just like him.  She does not feel comfortable in sharing their relationship issues with her sister-in-law (his sister).  Visit Diagnosis:    ICD-10-CM   1. Bipolar 2 disorder, major depressive episode (HCC)  F31.81 buPROPion (WELLBUTRIN XL) 300 MG 24 hr tablet    DULoxetine (CYMBALTA) 60 MG capsule    LORazepam (ATIVAN) 0.5 MG tablet    lamoTRIgine (LAMICTAL) 200 MG tablet  2. Marital estrangement  Z63.5     Past Psychiatric History: mood disorder  Past Medical History:  Past Medical History:  Diagnosis Date  . Anxiety   . Balance problems   . Bipolar disorder ([place])   . Chronic fatigue   . CKD (chronic kidney disease), stage II   . Depression   . Diabetes mellitus   . DKA, type 1 ([place]) 11/04/2011  . Elevated cholesterol   . Fibromyalgia   . GERD (gastroesophageal reflux disease)   . Headache   . History of suicidal ideation   . Hyperlipemia   . Hypertension   . Hypothyroidism   . IBS (irritable bowel syndrome)   . Obesity   . Sleep  apnea    HAS C -PAP / DOES NOT USE  . Stress incontinence    Pt had surgery to correct this.  . Tachycardia   . Tobacco abuse   . Tremor   . UTI (lower urinary tract infection)     Past Surgical History:  Procedure Laterality Date  . INCONTINENCE SURGERY    . NASAL FRACTURE SURGERY    . ovary removed    . OVARY SURGERY    . PUBOVAGINAL SLING  08/16/2011   Procedure: [name];  Surgeon: Bernestine Amass, MD;  Location: [facility];  Service: Urology;  Laterality: N/A;         . UTERINE FIBROID SURGERY  2001    Family Psychiatric History: see below  Family History:  Family History  Problem Relation Age of Onset  . Asthma Mother   . Bipolar disorder Mother   . Heart  disease Father   . Lymphoma Father   . Hypertension Father   . Thyroid disease Father   . Hyperlipidemia Father   . Diabetes Father   . Cancer Paternal Grandmother        lung and breast  . Bladder Cancer Paternal Grandfather   . Suicidality Maternal Grandfather   . Thyroid disease Brother     Social History:  Social History   Socioeconomic History  . Marital status: Married    Spouse name: Not on file  . Number of children: 0  . Years of education: 40  . Highest education level: Not on file  Occupational History  . Occupation: Disabled  Tobacco Use  . Smoking status: Former Smoker    Packs/day: 0.75    Years: 20.00    Pack years: 15.00    Types: Cigarettes    Quit date: 06/07/2012    Years since quitting: 7.0  . Smokeless tobacco: Never Used  Substance and Sexual Activity  . Alcohol use: No  . Drug use: No  . Sexual activity: Yes    Birth control/protection: Post-menopausal  Other Topics Concern  . Not on file  Social History Narrative   Lives at home with husband.   Right-handed.   Occasional caffeine use.   Social Determinants of Health   Financial Resource Strain: Low Risk   . Difficulty of Paying Living Expenses: Not hard at all  Food Insecurity: No Food Insecurity  . Worried About Charity fundraiser in the Last Year: Never true  . Ran Out of Food in the Last Year: Never true  Transportation Needs: No Transportation Needs  . Lack of Transportation (Medical): No  . Lack of Transportation (Non-Medical): No  Physical Activity: Inactive  . Days of Exercise per Week: 0 days  . Minutes of Exercise per Session: 0 min  Stress: Stress Concern Present  . Feeling of Stress : Very much  Social Connections: Not Isolated  . Frequency of Communication with Friends and Family: Twice a week  . Frequency of Social Gatherings with Friends and Family: More than three times a week  . Attends Religious Services: More than 4 times per year  . Active Member of Clubs or  Organizations: Yes  . Attends Archivist Meetings: More than 4 times per year  . Marital Status: Married    Allergies:  Allergies  Allergen Reactions  . Ciprofloxacin Swelling and Other (See Comments)    Per pt caused lips swell and nauseous feeling  . Levaquin [Levofloxacin] Swelling and Other (See Comments)    Per pt  caused lips swell and nauseous feeling  . Buspar [Buspirone] Other (See Comments)    abd cramping  . Linaclotide Other (See Comments)  . Advair Diskus [Fluticasone-Salmeterol] Other (See Comments)    Thrush   . Biaxin [Clarithromycin] Rash  . Hydroxyzine Palpitations    Metabolic Disorder Labs: Lab Results  Component Value Date   HGBA1C 8.0 (H) 08/31/2018   MPG 182.9 08/31/2018   MPG 229 (H) 12/25/2012   No results found for: PROLACTIN Lab Results  Component Value Date   CHOL 163 08/31/2018   TRIG 108 08/31/2018   HDL 62 08/31/2018   CHOLHDL 2.6 08/31/2018   VLDL 22 08/31/2018   LDLCALC 79 08/31/2018   Lab Results  Component Value Date   TSH 0.879 08/31/2018   TSH 2.169 07/11/2018    Therapeutic Level Labs: No results found for: LITHIUM No results found for: VALPROATE No components found for:  CBMZ  Current Medications: Current Outpatient Medications  Medication Sig Dispense Refill  . aspirin EC 81 MG tablet Take 81 mg by mouth at bedtime.     . botulinum toxin Type A (BOTOX) 100 units SOLR injection Inject 200 Units into the muscle every 3 (three) months. 200 Units 3  . buPROPion (WELLBUTRIN XL) 300 MG 24 hr tablet Take 1 tablet (300 mg total) by mouth daily. 30 tablet 1  . carvedilol (COREG) 25 MG tablet Take 25 mg by mouth 2 (two) times daily with a meal.    . Cholecalciferol (VITAMIN D3 SUPER STRENGTH) 50 MCG (2000 UT) TABS Take 1 tablet by mouth daily.     . Coenzyme Q10 (COQ-10 PO) Take 1 capsule by mouth daily.    Marland Kitchen dicyclomine (BENTYL) 10 MG capsule Take 10 mg by mouth 3 (three) times daily as needed for spasms.    .  DULoxetine (CYMBALTA) 60 MG capsule Take 1 capsule (60 mg total) by mouth 2 (two) times daily. 60 capsule 1  . fluticasone (FLONASE) 50 MCG/ACT nasal spray Place 2 sprays into the nose daily as needed for allergies.     . folic acid (FOLVITE) A999333 MCG tablet Take 400 mcg by mouth every evening.     . furosemide (LASIX) 20 MG tablet Take 20 mg by mouth daily.     Marland Kitchen gabapentin (NEURONTIN) 100 MG capsule Take 300 mg by mouth at bedtime.    Marland Kitchen HYDROcodone-acetaminophen (NORCO/VICODIN) 5-325 MG tablet Take one tab po q 4 hrs prn pain 6 tablet 0  . insulin glargine (LANTUS) 100 UNIT/ML injection Inject 0.5 mLs (50 Units total) into the skin daily. (Patient not taking: Reported on 05/14/2019) 10 mL 11  . lamoTRIgine (LAMICTAL) 200 MG tablet Take 1 tablet (200 mg total) by mouth daily. 30 tablet 1  . levothyroxine (SYNTHROID, LEVOTHROID) 150 MCG tablet Take 150 mcg by mouth daily before breakfast.    . LORazepam (ATIVAN) 0.5 MG tablet Take 1 tablet (0.5 mg total) by mouth 2 (two) times daily as needed for anxiety. 60 tablet 1  . lubiprostone (AMITIZA) 8 MCG capsule Take 8 mcg by mouth as needed for constipation.     Marland Kitchen NOVOLOG 100 UNIT/ML injection Inject 2.425-2.95 Units into the skin continuous. Via pump 7pm to midnight 2.950 8:30am -7pm2.925 Midnight - 8:30 2.425  2  . Omega-3 Fatty Acids (FISH OIL PO) Take 1 capsule by mouth daily.    Marland Kitchen omeprazole (PRILOSEC) 20 MG capsule Take 20 mg by mouth at bedtime.     . ondansetron (ZOFRAN ODT) 4 MG  disintegrating tablet Take 1 tablet (4 mg total) by mouth every 8 (eight) hours as needed. (Patient taking differently: Take 4 mg by mouth every 8 (eight) hours as needed for nausea or vomiting. ) 20 tablet 6  . pravastatin (PRAVACHOL) 80 MG tablet Take 80 mg by mouth at bedtime.    Marland Kitchen PROAIR HFA 108 (90 Base) MCG/ACT inhaler Inhale 1-2 puffs into the lungs every 6 (six) hours as needed for wheezing or shortness of breath.   1  . rizatriptan (MAXALT-MLT) 10 MG  disintegrating tablet Take 1 tablet (10 mg total) as needed by mouth. May repeat in 2 hours if needed (Patient not taking: Reported on 05/14/2019) 15 tablet 11  . traMADol (ULTRAM) 50 MG tablet Take 50 mg by mouth 2 (two) times daily.      No current facility-administered medications for this visit.     Musculoskeletal: Strength & Muscle Tone: unable to assess due to telemed visit Gait & Station: unable to assess due to telemed visit Patient leans: unable to assess due to telemed visit  Psychiatric Specialty Exam: ROS  Last menstrual period 03/23/2017.There is no height or weight on file to calculate BMI.  General Appearance: Fairly Groomed  Eye Contact:  Good  Speech:  Clear and Coherent and Normal Rate  Volume:  Normal  Mood:  Depressed  Affect:  Congruent  Thought Process:  Goal Directed, Linear and Descriptions of Associations: Intact  Orientation:  Full (Time, Place, and Person)  Thought Content: Logical   Suicidal Thoughts:  No  Homicidal Thoughts:  No  Memory:  Recent;   Good Remote;   Good  Judgement:  Fair  Insight:  Fair  Psychomotor Activity:  Normal  Concentration:  Concentration: Good and Attention Span: Good  Recall:  Good  Fund of Knowledge: Good  Language: Good  Akathisia:  Negative  Handed:  Right  AIMS (if indicated): not done  Assets:  Communication Skills Desire for Improvement Financial Resources/Insurance Social Support  ADL's:  Intact  Cognition: WNL  Sleep:  Fair   Screenings: AIMS     Admission (Discharged) from 08/30/2018 in [facility] ED to Hosp-Admission (Discharged) from 07/10/2018 in [facility]  AIMS Total Score  0  0    AUDIT     Admission (Discharged) from 08/30/2018 in [facility] ED to Hosp-Admission (Discharged) from 07/10/2018 in [facility]  Alcohol Use Disorder Identification Test Final Score  (AUDIT)  0  0    PHQ2-9     Counselor from 02/20/2019 in [facility] Counselor from 01/23/2019 in [facility]  PHQ-2 Total Score  2  5  PHQ-9 Total Score  22  25       Assessment and Plan: 49 year old female with history of bipolar disorder who continues to be depressed due to the ongoing dyadic discord with her husband. Her marital issues seem to be the main stressor for her and continue to perpetuate and exacerbate her depressive symptoms. It was noted that patient is taking tramadol for pain and the possibility of risk of serotonin syndrome with combination of Cymbalta with tramadol as discussed.    1. Bipolar 2 disorder, major depressive episode (HCC) - buPROPion (WELLBUTRIN XL) 300 MG 24 hr tablet; Take 1 tablet (300 mg total) by mouth daily.  Dispense: 30 tablet; Refill: 1 - Continue DULoxetine (CYMBALTA) 60 MG capsule; Take 1 capsule (60  mg total) by mouth 2 (two) times daily.  Dispense: 60 capsule; Refill: 1 - lamoTRIgine (LAMICTAL) 200 MG tablet; Take 1 tablet (200 mg total) by mouth daily.  Dispense: 30 tablet; Refill: 1 - LORazepam (ATIVAN) 0.5 MG tablet; Take 1 tablet (0.5 mg total) by mouth 2 (two) times daily as needed for anxiety.  Dispense: 60 tablet; Refill: 1  2. Marital Estrangement  Patient was encouraged to call 911 in case of active suicidal ideations.   Patient was encouraged to eat healthy and make right dietary options so that she can keep her blood sugars under control.  F/up in 6 weeks with dr. Modesta Messing. Continue individual therapy.  [place] Crane, MD 06/19/2019, 11:21 AM

## 2019-07-04 ENCOUNTER — Ambulatory Visit: Payer: BC Managed Care – PPO | Admitting: Neurology
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
## 2019-08-02 ENCOUNTER — Ambulatory Visit (HOSPITAL_COMMUNITY): Payer: BC Managed Care – PPO | Admitting: Psychiatry

## 2019-08-03 ENCOUNTER — Encounter (HOSPITAL_COMMUNITY): Payer: Self-pay | Admitting: Psychiatry

## 2019-08-03 ENCOUNTER — Ambulatory Visit (INDEPENDENT_AMBULATORY_CARE_PROVIDER_SITE_OTHER): Payer: BC Managed Care – PPO | Admitting: Psychiatry

## 2019-08-03 ENCOUNTER — Other Ambulatory Visit: Payer: Self-pay

## 2019-08-03 DIAGNOSIS — F3181 Bipolar II disorder: Secondary | ICD-10-CM | POA: Diagnosis not present

## 2019-08-03 DIAGNOSIS — F419 Anxiety disorder, unspecified: Secondary | ICD-10-CM

## 2019-08-03 MED ORDER — DULOXETINE HCL 60 MG PO CPEP: 60.0000 mg | ORAL_CAPSULE | Freq: Two times a day (BID) | ORAL | 1 refills | Status: DC

## 2019-08-03 MED ORDER — LAMOTRIGINE 200 MG PO TABS: 200.0000 mg | ORAL_TABLET | Freq: Every day | ORAL | 1 refills | Status: DC

## 2019-08-03 MED ORDER — BUPROPION HCL ER (XL) 300 MG PO TB24: 300.0000 mg | ORAL_TABLET | Freq: Every day | ORAL | 1 refills | Status: DC

## 2019-08-03 NOTE — Patient Instructions (Signed)
1. Continue lamotrigine 200 mg daily  2.Continue duloxetine 60 mgdaily  3.Continuebupropion300 mg daily  4. Continuelorazepam 0.5 mg twice a day as needed for anxiety 5.Will make referral for [place] 6Next appointment:3/30 at 2:30

## 2019-08-16 ENCOUNTER — Telehealth: Payer: Self-pay | Admitting: Neurology

## 2019-08-16 NOTE — Telephone Encounter (Signed)
Pt called stating she has been having tremors and has begun having memory issues. Patient states there are times she forgets where she is and the road she is on when she has been on that road plenty of times before. Requested a CB in order to discuss

## 2019-08-20 NOTE — Telephone Encounter (Signed)
LVM advising she see her PCP to be evaluated for possible causes of her new symptoms. Then if PCP feels she needs to see Dr Krista Blue for further evaluation, a referral can be sent over to be seen for new problems. Left office #.
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
## 2019-09-04 ENCOUNTER — Other Ambulatory Visit: Payer: Self-pay

## 2019-09-04 ENCOUNTER — Encounter (HOSPITAL_COMMUNITY): Payer: Self-pay | Admitting: Psychiatry

## 2019-09-04 ENCOUNTER — Ambulatory Visit (INDEPENDENT_AMBULATORY_CARE_PROVIDER_SITE_OTHER): Payer: BC Managed Care – PPO | Admitting: Psychiatry

## 2019-09-04 DIAGNOSIS — F3181 Bipolar II disorder: Secondary | ICD-10-CM | POA: Diagnosis not present

## 2019-09-04 NOTE — Patient Instructions (Signed)
1. Continue lamotrigine 200 mg daily  2.Continue duloxetine 60 mgdaily (limited benefit from higher dose) 3.Continuebupropion300 mg daily (maximum dose given her renal function) 4. Continuelorazepam 0.25-0.5 mg twice a day as needed for anxiety 5Next appointment:4/29 at 3:20 for 30 mins, video 6. Referral to ECT  CONTACT INFORMATION  What to do if you need to get in touch with someone regarding a psychiatric issue:  1. EMERGENCY: For psychiatric emergencies (if you are suicidal or if there are any other safety issues) call 911 and/or go to your nearest Emergency Room immediately.   2. IF YOU NEED SOMEONE TO TALK TO RIGHT NOW: Given my clinical responsibilities, I may not be able to speak with you over the phone for a prolonged period of time.  a. You may always call The National Suicide Prevention Lifeline at 1-800-273-TALK [phone]).  b. Your county of residence will also have local crisis services. For [facility]: [place] at [phone] ([place])

## 2019-09-12 ENCOUNTER — Ambulatory Visit (HOSPITAL_COMMUNITY)
Admission: RE | Admit: 2019-09-12 | Discharge: 2019-09-12 | Disposition: A | Discharge status: Home / Self Care | Payer: BC Managed Care – PPO | Source: Home / Self Care | Attending: Psychiatry | Admitting: Psychiatry

## 2019-09-12 ENCOUNTER — Encounter (HOSPITAL_COMMUNITY): Payer: Self-pay

## 2019-09-12 ENCOUNTER — Emergency Department (HOSPITAL_COMMUNITY)
Admission: EM | Admit: 2019-09-12 | Discharge: 2019-09-12 | Disposition: A | Discharge status: Home / Self Care | Payer: BC Managed Care – PPO | Attending: Emergency Medicine | Admitting: Emergency Medicine

## 2019-09-12 DIAGNOSIS — I129 Hypertensive chronic kidney disease with stage 1 through stage 4 chronic kidney disease, or unspecified chronic kidney disease: Secondary | ICD-10-CM | POA: Insufficient documentation

## 2019-09-12 DIAGNOSIS — E039 Hypothyroidism, unspecified: Secondary | ICD-10-CM | POA: Insufficient documentation

## 2019-09-12 DIAGNOSIS — Z7982 Long term (current) use of aspirin: Secondary | ICD-10-CM | POA: Diagnosis not present

## 2019-09-12 DIAGNOSIS — E1022 Type 1 diabetes mellitus with diabetic chronic kidney disease: Secondary | ICD-10-CM | POA: Diagnosis not present

## 2019-09-12 DIAGNOSIS — F3181 Bipolar II disorder: Secondary | ICD-10-CM | POA: Insufficient documentation

## 2019-09-12 DIAGNOSIS — Z87891 Personal history of nicotine dependence: Secondary | ICD-10-CM | POA: Insufficient documentation

## 2019-09-12 DIAGNOSIS — Z008 Encounter for other general examination: Secondary | ICD-10-CM

## 2019-09-12 DIAGNOSIS — Z20822 Contact with and (suspected) exposure to covid-19: Secondary | ICD-10-CM | POA: Insufficient documentation

## 2019-09-12 DIAGNOSIS — F99 Mental disorder, not otherwise specified: Secondary | ICD-10-CM | POA: Diagnosis present

## 2019-09-12 DIAGNOSIS — N184 Chronic kidney disease, stage 4 (severe): Secondary | ICD-10-CM | POA: Insufficient documentation

## 2019-09-12 DIAGNOSIS — R45851 Suicidal ideations: Secondary | ICD-10-CM | POA: Insufficient documentation

## 2019-09-12 DIAGNOSIS — Z79899 Other long term (current) drug therapy: Secondary | ICD-10-CM | POA: Insufficient documentation

## 2019-09-12 LAB — SALICYLATE LEVEL: Salicylate Lvl: <7 mg/dL — ABNORMAL LOW (ref 7.0–30.0)

## 2019-09-12 LAB — RAPID URINE DRUG SCREEN, HOSP PERFORMED
Amphetamines: NOT DETECTED
Barbiturates: NOT DETECTED
Benzodiazepines: NOT DETECTED
Cocaine: NOT DETECTED
Opiates: NOT DETECTED
Tetrahydrocannabinol: NOT DETECTED

## 2019-09-12 LAB — COMPREHENSIVE METABOLIC PANEL
ALT: 15 U/L (ref 0–44)
AST: 14 U/L — ABNORMAL LOW (ref 15–41)
Albumin: 3.3 g/dL — ABNORMAL LOW (ref 3.5–5.0)
Alkaline Phosphatase: 89 U/L (ref 38–126)
Anion gap: 9 (ref 5–15)
BUN: 31 mg/dL — ABNORMAL HIGH (ref 6–20)
CO2: 25 mmol/L (ref 22–32)
Calcium: 9.2 mg/dL (ref 8.9–10.3)
Chloride: 104 mmol/L (ref 98–111)
Creatinine, Ser: 2.12 mg/dL — ABNORMAL HIGH (ref 0.44–1.00)
GFR calc Af Amer: 31 mL/min — ABNORMAL LOW (ref >60)
GFR calc non Af Amer: 27 mL/min — ABNORMAL LOW (ref >60)
Glucose, Bld: 176 mg/dL — ABNORMAL HIGH (ref 70–99)
Potassium: 3.9 mmol/L (ref 3.5–5.1)
Sodium: 138 mmol/L (ref 135–145)
Total Bilirubin: 0.5 mg/dL (ref 0.3–1.2)
Total Protein: 6.6 g/dL (ref 6.5–8.1)

## 2019-09-12 LAB — CBC
HCT: 39.6 % (ref 36.0–46.0)
Hemoglobin: 12.1 g/dL (ref 12.0–15.0)
MCH: 28.1 pg (ref 26.0–34.0)
MCHC: 30.6 g/dL (ref 30.0–36.0)
MCV: 91.9 fL (ref 80.0–100.0)
Platelets: 255 10*3/uL (ref 150–400)
RBC: 4.31 MIL/uL (ref 3.87–5.11)
RDW: 14.1 % (ref 11.5–15.5)
WBC: 9.4 10*3/uL (ref 4.0–10.5)
nRBC: 0 % (ref 0.0–0.2)

## 2019-09-12 LAB — RESPIRATORY PANEL BY RT PCR (FLU A&B, COVID)
Influenza A by PCR: NEGATIVE
Influenza B by PCR: NEGATIVE
SARS Coronavirus 2 by RT PCR: NEGATIVE

## 2019-09-12 LAB — CBG MONITORING, ED
Glucose-Capillary: 104 mg/dL — ABNORMAL HIGH (ref 70–99)
Glucose-Capillary: 388 mg/dL — ABNORMAL HIGH (ref 70–99)
Glucose-Capillary: 317 mg/dL — ABNORMAL HIGH (ref 70–99)

## 2019-09-12 LAB — ETHANOL: Alcohol, Ethyl (B): <10 mg/dL (ref <10)

## 2019-09-12 LAB — ACETAMINOPHEN LEVEL: Acetaminophen (Tylenol), Serum: <10 ug/mL — ABNORMAL LOW (ref 10–30)

## 2019-09-12 LAB — PREGNANCY, URINE: Preg Test, Ur: NEGATIVE

## 2019-09-12 MED ORDER — FOLIC ACID 1 MG PO TABS
1000.0000 ug | ORAL_TABLET | Freq: Every evening | ORAL | Status: DC
  Administered 2019-09-12: 1 mg via ORAL
  Filled 2019-09-12: qty 1

## 2019-09-12 MED ORDER — LUBIPROSTONE 8 MCG PO CAPS
8.0000 ug | ORAL_CAPSULE | ORAL | Status: DC | PRN
  Filled 2019-09-12: qty 1

## 2019-09-12 MED ORDER — LEVOTHYROXINE SODIUM 50 MCG PO TABS: 150.0000 ug | ORAL_TABLET | Freq: Every day | ORAL | Status: DC

## 2019-09-12 MED ORDER — ALBUTEROL SULFATE HFA 108 (90 BASE) MCG/ACT IN AERS: 1.0000 | INHALATION_SPRAY | Freq: Four times a day (QID) | RESPIRATORY_TRACT | Status: DC | PRN

## 2019-09-12 MED ORDER — CARVEDILOL 25 MG PO TABS
25.0000 mg | ORAL_TABLET | Freq: Two times a day (BID) | ORAL | Status: DC
  Administered 2019-09-12: 25 mg via ORAL
  Filled 2019-09-12 (×2): qty 1

## 2019-09-12 MED ORDER — INSULIN ASPART 100 UNIT/ML ~~LOC~~ SOLN
0.0000 [IU] | Freq: Every day | SUBCUTANEOUS | Status: DC
  Filled 2019-09-12: qty 0.05

## 2019-09-12 MED ORDER — ONDANSETRON 4 MG PO TBDP: 4.0000 mg | ORAL_TABLET | Freq: Three times a day (TID) | ORAL | Status: DC | PRN

## 2019-09-12 MED ORDER — ASPIRIN EC 81 MG PO TBEC: 81.0000 mg | DELAYED_RELEASE_TABLET | Freq: Every day | ORAL | Status: DC

## 2019-09-12 MED ORDER — INSULIN ASPART 100 UNIT/ML ~~LOC~~ SOLN
4.0000 [IU] | Freq: Three times a day (TID) | SUBCUTANEOUS | Status: DC
  Administered 2019-09-12 (×2): 4 [IU] via SUBCUTANEOUS
  Filled 2019-09-12: qty 0.04

## 2019-09-12 MED ORDER — DULOXETINE HCL 30 MG PO CPEP
60.0000 mg | ORAL_CAPSULE | Freq: Two times a day (BID) | ORAL | Status: DC
  Administered 2019-09-12: 60 mg via ORAL
  Filled 2019-09-12: qty 2

## 2019-09-12 MED ORDER — GABAPENTIN 300 MG PO CAPS: 300.0000 mg | ORAL_CAPSULE | Freq: Every day | ORAL | Status: DC

## 2019-09-12 MED ORDER — BUPROPION HCL ER (XL) 150 MG PO TB24
300.0000 mg | ORAL_TABLET | Freq: Every day | ORAL | Status: DC
  Administered 2019-09-12: 300 mg via ORAL
  Filled 2019-09-12: qty 2

## 2019-09-12 MED ORDER — PSYLLIUM 95 % PO PACK
1.0000 | PACK | Freq: Every day | ORAL | Status: DC
  Administered 2019-09-12: 1 via ORAL
  Filled 2019-09-12: qty 1

## 2019-09-12 MED ORDER — FUROSEMIDE 20 MG PO TABS
20.0000 mg | ORAL_TABLET | Freq: Every day | ORAL | Status: DC
  Administered 2019-09-12: 20 mg via ORAL
  Filled 2019-09-12: qty 1

## 2019-09-12 MED ORDER — VITAMIN D 25 MCG (1000 UNIT) PO TABS
2000.0000 [IU] | ORAL_TABLET | Freq: Every day | ORAL | Status: DC
  Administered 2019-09-12: 2000 [IU] via ORAL
  Filled 2019-09-12: qty 2

## 2019-09-12 MED ORDER — HYDROCODONE-ACETAMINOPHEN 5-325 MG PO TABS: 0.5000 | ORAL_TABLET | ORAL | Status: DC | PRN

## 2019-09-12 MED ORDER — LAMOTRIGINE 100 MG PO TABS
200.0000 mg | ORAL_TABLET | Freq: Every day | ORAL | Status: DC
  Administered 2019-09-12: 200 mg via ORAL
  Filled 2019-09-12: qty 2

## 2019-09-12 MED ORDER — PRAVASTATIN SODIUM 20 MG PO TABS: 80.0000 mg | ORAL_TABLET | Freq: Every day | ORAL | Status: DC

## 2019-09-12 MED ORDER — INSULIN GLARGINE 100 UNIT/ML ~~LOC~~ SOLN
25.0000 [IU] | Freq: Two times a day (BID) | SUBCUTANEOUS | Status: DC
  Administered 2019-09-12: 25 [IU] via SUBCUTANEOUS
  Filled 2019-09-12 (×2): qty 0.25

## 2019-09-12 MED ORDER — INSULIN ASPART 100 UNIT/ML ~~LOC~~ SOLN
2.4250 [IU] | SUBCUTANEOUS | Status: DC
  Filled 2019-09-12: qty 0.03

## 2019-09-12 MED ORDER — INSULIN GLARGINE 100 UNIT/ML ~~LOC~~ SOLN
50.0000 [IU] | Freq: Every day | SUBCUTANEOUS | Status: DC
  Filled 2019-09-12: qty 0.5

## 2019-09-12 MED ORDER — INSULIN ASPART 100 UNIT/ML ~~LOC~~ SOLN
0.0000 [IU] | Freq: Three times a day (TID) | SUBCUTANEOUS | Status: DC
  Administered 2019-09-12: 11 [IU] via SUBCUTANEOUS
  Administered 2019-09-12: 15 [IU] via SUBCUTANEOUS
  Filled 2019-09-12: qty 0.15

## 2019-09-12 MED ORDER — PANTOPRAZOLE SODIUM 40 MG PO TBEC
40.0000 mg | DELAYED_RELEASE_TABLET | Freq: Every day | ORAL | Status: DC
  Administered 2019-09-12: 40 mg via ORAL
  Filled 2019-09-12: qty 2

## 2019-09-12 MED ORDER — LORAZEPAM 0.5 MG PO TABS: 0.5000 mg | ORAL_TABLET | Freq: Two times a day (BID) | ORAL | Status: DC | PRN

## 2019-09-12 NOTE — BH Assessment (Signed)
[facility] Assessment Progress Note  Per Shuvon Rankin, FNP, this pt does not require psychiatric hospitalization at this time.  Pt is to be discharged from [facility] with recommendation to continue treatment with her current outpatient providers, both in the [facility] system.  This has been included in pt's discharge instructions.  Pt's nurse, Tilda Franco, has been notified.  Jalene Mullet, [place] Triage Specialist [phone]

## 2019-09-12 NOTE — Progress Notes (Signed)
Inpatient Diabetes Program Recommendations  AACE/ADA: New Consensus Statement on Inpatient Glycemic Control (2015)  Target Ranges:  Prepandial:   less than 140 mg/dL      Peak postprandial:   less than 180 mg/dL (1-2 hours)      Critically ill patients:  140 - 180 mg/dL   Lab Results  Component Value Date   GLUCAP 388 (H) 09/12/2019   HGBA1C 8.0 (H) 08/31/2018    Review of Glycemic Control  Diabetes history: DM1 Outpatient Diabetes medications: T-Slim (see settings below) Current orders for Inpatient glycemic control: Lantus 50 units QHS, Novolog 0-15 units tidwc and hs + 2-3 units tidwc  Pump settings: Total basal: 60 units, 1:12 CHO ratio, CF 45 with goal of 120   HgbA1C - 8.0% - needs tighter control with insulin pump  Inpatient Diabetes Program Recommendations:     Lantus 25 units bid Novolog 0-9 units tidwc and hs Novolog 4 units tidwc (will likely need to increase to 6 units tidwc if pt eating regular meals)  Pt tearful when I spoke with her about her diabetes control. States "I just want to go home." Does not have extra pump insertion sets with her. Will not be able to restart pump until home. Not appropriate at present time to discuss HgbA1C.  Discussed above with Dr Roslynn Amble and RN.  Thank you. Lorenda Peck, RD, LDN, CDE Inpatient Diabetes Coordinator [phone]

## 2019-09-12 NOTE — ED Notes (Addendum)
Requested patient to remove insulin pump. Insulin pump locked up with security in box 13. Paper work and key attached and with patient belongings.   Requested Roslynn Amble, MD to order sliding scale insulin and long acting.

## 2019-09-12 NOTE — Progress Notes (Signed)
09/12/2019  1740  Insulin pump given to patient.

## 2019-09-12 NOTE — ED Triage Notes (Signed)
Pt states that she feels like she has no purpose Pt has been assessed at [facility] but they have no appropriate beds because pt is on an insulin pump

## 2019-09-12 NOTE — ED Notes (Signed)
Patient given meal tray.

## 2019-09-12 NOTE — BHH Suicide Risk Assessment (Cosign Needed)
Suicide Risk Assessment  Discharge Assessment   [facility] Discharge Suicide Risk Assessment   Principal Problem: <principal problem not specified> Discharge Diagnoses: Active Problems:   * No active hospital problems. *   Total Time spent with patient: 30 minutes  Musculoskeletal: Strength & Muscle Tone: within normal limits Gait & Station: normal Patient leans: N/A  Psychiatric Specialty Exam:   Blood pressure 138/80, pulse 69, temperature 97.6 F (36.4 C), temperature source Oral, resp. rate 16, last menstrual period 03/23/2017, SpO2 94 %.There is no height or weight on file to calculate BMI.  General Appearance: Casual  Eye Contact::  Good  Speech:  Blocked and Normal Rate  Volume:  Normal  Mood:  Depressed  Affect:  Appropriate and Congruent  Thought Process:  Coherent, Goal Directed and Descriptions of Associations: Intact  Orientation:  Full (Time, Place, and Person)  Thought Content:  WDL  Suicidal Thoughts:  No  Homicidal Thoughts:  No  Memory:  Immediate;   Good Recent;   Good  Judgement:  Intact  Insight:  Present  Psychomotor Activity:  Normal  Concentration:  Good  Recall:  Good  Fund of Knowledge:Good  Language: Good  Akathisia:  No  Handed:  Right  AIMS (if indicated):     Assets:  Communication Skills Desire for Improvement Financial Resources/Insurance Housing Social Support  Sleep:     Cognition: WNL  ADL's:  Intact   Mental Status Per Nursing Assessment::   On Admission:    Jasmine [name], 49 y.o., female patient seen via tele psych by this provider, Dr. Dwyane Dee; and chart reviewed on 09/12/19.  On evaluation Jasmine [name] reports she has been feeling stressed that she is unable to contribute to finances.  States that she was recently taken off of disability.  At this time patient denies suicidal/self-harm/homicidal ideations, psychosis, paranoia.  States she lives with her father and her husband were both supportive although her father tells her  at times " that nothing is wrong with me and I just need to get over it.  He does not understand."  Patient reports she has outpatient psychiatric services with Dr. Modesta Messing and she is scheduled into or 3 weeks to follow up with a Dr. Weber Cooks for ECT.  Patient reports she feels safe to go home.  Patient gave permission to speak to her father for collateral information. During evaluation Jasmine [name] is alert/oriented x 4; calm/cooperative; and mood is congruent with affect.  She does not appear to be responding to internal/external stimuli or delusional thoughts.  Patient denies suicidal/self-harm/homicidal ideation, psychosis, and paranoia.  Patient answered question appropriately.    Collateral information: Spoke with patient's father [name]) states that he feel patient is safe to come home that he just wants her to get the help she needs so that she will feel better so that she is not depressed as often.  Discussed patient's follow-up appointments with Dr. Modesta Messing and Dr. Weber Cooks.  States that he will talk to patient's husband about picking her up from hospital.  Demographic Factors:  Caucasian  Loss Factors: NA  Historical Factors: Impulsivity  Risk Reduction Factors:   Sense of responsibility to family, Religious beliefs about death, Living with another person, especially a relative, Positive social support and Positive therapeutic relationship  Continued Clinical Symptoms:  Previous Psychiatric Diagnoses and Treatments  Cognitive Features That Contribute To Risk:  None    Suicide Risk:  Minimal: No identifiable suicidal ideation.  Patients presenting with no risk factors  but with morbid ruminations; may be classified as minimal risk based on the severity of the depressive symptoms    Plan Of Care/Follow-up recommendations:  Activity:  As tolerated Diet:  Heart healthy and car modified     Discharge Instructions     For your behavioral health needs, you are advised to  continue treatment with your current outpatient providers:       Norman Clay, MD      [facility] [place]. [address] [place], [place] [address]      [phone]       Alethia Berthold, MD      [facility]      [address] [place], [place] [address]      [phone]     Disposition: Patient psychiatrically cleared No evidence of imminent risk to self or others at present.   Patient does not meet criteria for psychiatric inpatient admission. Supportive therapy provided about ongoing stressors. Discussed crisis plan, support from social network, calling 911, coming to the Emergency Department, and calling Suicide Hotline.  Roseanne Juenger, NP 09/12/2019, 4:03 PM

## 2019-09-12 NOTE — ED Provider Notes (Signed)
[facility] DEPT Provider Note   CSN: [id number] Arrival date & time: 09/12/19  0225     History Chief Complaint  Patient presents with  . Suicidal    Jasmine [name] is a 49 y.o. female.  The history is provided by the patient and medical records.    49 year old female with history of anxiety, bipolar disorder, chronic kidney disease, type 1 diabetes, fibromyalgia, GERD, hyperlipidemia, hypertension, hypothyroidism, obesity, sleep apnea, presenting to the ED from behavioral health for medical clearance.  Patient went to their facility voluntarily today due to some passive suicidal thoughts and worsening depression.  She feels like "everyone would be better off without her".  States she does not have a good support system and does not feel other people understand her.  She is currently followed by psychiatry and has been taking her medications as prescribed.  She denies any homicidal ideation.  No hallucinations.  Patient was recommended for inpatient treatment, however no beds available currently so will seek outside placement.  Past Medical History:  Diagnosis Date  . Anxiety   . Balance problems   . Bipolar disorder ([place])   . Chronic fatigue   . CKD (chronic kidney disease), stage II   . Depression   . Diabetes mellitus   . DKA, type 1 ([place]) 11/04/2011  . Elevated cholesterol   . Fibromyalgia   . GERD (gastroesophageal reflux disease)   . Headache   . History of suicidal ideation   . Hyperlipemia   . Hypertension   . Hypothyroidism   . IBS (irritable bowel syndrome)   . Obesity   . Sleep apnea    HAS C -PAP / DOES NOT USE  . Stress incontinence    Pt had surgery to correct this.  . Tachycardia   . Tobacco abuse   . Tremor   . UTI (lower urinary tract infection)     Patient Active Problem List   Diagnosis Date Noted  . Marital estrangement 06/19/2019  . Peripheral neuropathy 02/15/2019  . Dizziness 09/27/2018  . Polypharmacy  09/27/2018  . Severe recurrent major depression without psychotic features ([place]) 08/30/2018  . Bipolar 2 disorder, major depressive episode ([place]) 07/10/2018  . MDD (major depressive disorder), recurrent, severe, with psychosis ([place]) 07/10/2018  . CKD (chronic kidney disease), stage IV ([place]) 07/01/2018  . Transient Hypoglycemia 07/01/2018  . Chronic migraine 01/17/2017  . Diabetic peripheral neuropathy ([place]) 01/17/2017  . OSA (obstructive sleep apnea) 07/27/2016  . Wheezing 07/27/2016  . Hyperglycemia 12/25/2012  . Acute on chronic renal failure ([place]) 12/25/2012  . Pulmonary infiltrates 10/02/2012  . Postnasal drip 10/02/2012  . Hyperkalemia 09/25/2012  . Morbid obesity ([place]) 09/24/2012  . HTN (hypertension) 09/24/2012  . Bipolar II disorder ([place]) 09/24/2012  . URI (upper respiratory infection) 09/24/2012  . DKA, type 1 ([place]) 11/04/2011  . Gastroenteritis 11/03/2011  . DM (diabetes mellitus), type 1, uncontrolled ([place]) 11/03/2011  . Hyponatremia 11/03/2011  . Elevated lipase 11/03/2011  . Hypothyroidism 11/03/2011  . Tobacco abuse 11/03/2011  . SUI (stress urinary incontinence, female) 08/16/2011    Past Surgical History:  Procedure Laterality Date  . INCONTINENCE SURGERY    . NASAL FRACTURE SURGERY    . ovary removed    . OVARY SURGERY    . PUBOVAGINAL SLING  08/16/2011   Procedure: [name];  Surgeon: Bernestine Amass, MD;  Location: [facility];  Service: Urology;  Laterality: N/A;         . UTERINE FIBROID  SURGERY  2001     OB History    Gravida  1   Para      Term      Preterm      AB      Living        SAB      TAB      Ectopic      Multiple      Live Births              Family History  Problem Relation Age of Onset  . Asthma Mother   . Bipolar disorder Mother   . Heart disease Father   . Lymphoma Father   . Hypertension Father   . Thyroid disease Father   . Hyperlipidemia Father   . Diabetes Father   . Cancer Paternal  Grandmother        lung and breast  . Bladder Cancer Paternal Grandfather   . Suicidality Maternal Grandfather   . Thyroid disease Brother     Social History   Tobacco Use  . Smoking status: Former Smoker    Packs/day: 0.75    Years: 20.00    Pack years: 15.00    Types: Cigarettes    Quit date: 06/07/2012    Years since quitting: 7.2  . Smokeless tobacco: Never Used  Substance Use Topics  . Alcohol use: No  . Drug use: No    Home Medications Prior to Admission medications   Medication Sig Start Date End Date Taking? Authorizing Provider  aspirin EC 81 MG tablet Take 81 mg by mouth at bedtime.     [provider]  botulinum toxin Type A (BOTOX) 100 units SOLR injection Inject 200 Units into the muscle every 3 (three) months. 03/22/19   Marcial Pacas, MD  buPROPion (WELLBUTRIN XL) 300 MG 24 hr tablet Take 1 tablet (300 mg total) by mouth daily. 08/16/19   Norman Clay, MD  carvedilol (COREG) 25 MG tablet Take 25 mg by mouth 2 (two) times daily with a meal.    [provider]  Cholecalciferol (VITAMIN D3 SUPER STRENGTH) 50 MCG (2000 UT) TABS Take 1 tablet by mouth daily.  07/26/18   [provider]  Coenzyme Q10 (COQ-10 PO) Take 1 capsule by mouth daily.    [provider]  dicyclomine (BENTYL) 10 MG capsule Take 10 mg by mouth 3 (three) times daily as needed for spasms.    [provider]  DULoxetine (CYMBALTA) 60 MG capsule Take 1 capsule (60 mg total) by mouth 2 (two) times daily. 08/15/19 10/14/19  Norman Clay, MD  fluticasone (FLONASE) 50 MCG/ACT nasal spray Place 2 sprays into the nose daily as needed for allergies.     [provider]  folic acid (FOLVITE) 440 MCG tablet Take 400 mcg by mouth every evening.     [provider]  furosemide (LASIX) 20 MG tablet Take 20 mg by mouth daily.     [provider]  gabapentin (NEURONTIN) 100 MG capsule Take 300 mg by mouth at bedtime.    [provider]    HYDROcodone-acetaminophen (NORCO/VICODIN) 5-325 MG tablet Take one tab po q 4 hrs prn pain Patient not taking: Reported on 08/03/2019 04/28/19   Triplett, Tammy, PA-C  insulin glargine (LANTUS) 100 UNIT/ML injection Inject 0.5 mLs (50 Units total) into the skin daily. Patient not taking: Reported on 05/14/2019 09/04/18   Derrill Center, NP  lamoTRIgine (LAMICTAL) 200 MG tablet Take 1 tablet (200  mg total) by mouth daily. 08/17/19   Norman Clay, MD  levothyroxine (SYNTHROID, LEVOTHROID) 150 MCG tablet Take 150 mcg by mouth daily before breakfast.    [provider]  LORazepam (ATIVAN) 0.5 MG tablet Take 1 tablet (0.5 mg total) by mouth 2 (two) times daily as needed for anxiety. 06/19/19   [place]ev[place] Crane, MD  lubiprostone (AMITIZA) 8 MCG capsule Take 8 mcg by mouth as needed for constipation.     [provider]  NOVOLOG 100 UNIT/ML injection Inject 2.425-2.95 Units into the skin continuous. Via pump 7pm to midnight 2.950 8:30am -7pm2.925 Midnight - 8:30 2.425 02/24/17   [provider]  Omega-3 Fatty Acids (FISH OIL PO) Take 1 capsule by mouth daily.    [provider]  omeprazole (PRILOSEC) 20 MG capsule Take 20 mg by mouth at bedtime.     [provider]  ondansetron (ZOFRAN ODT) 4 MG disintegrating tablet Take 1 tablet (4 mg total) by mouth every 8 (eight) hours as needed. Patient taking differently: Take 4 mg by mouth every 8 (eight) hours as needed for nausea or vomiting.  01/17/17   Marcial Pacas, MD  pravastatin (PRAVACHOL) 80 MG tablet Take 80 mg by mouth at bedtime.    [provider]  PROAIR HFA 108 [phone] Base) MCG/ACT inhaler Inhale 1-2 puffs into the lungs every 6 (six) hours as needed for wheezing or shortness of breath.  12/30/16   [provider]  rizatriptan (MAXALT-MLT) 10 MG disintegrating tablet Take 1 tablet (10 mg total) as needed by mouth. May repeat in 2 hours if needed Patient not taking: Reported on 05/14/2019 04/20/17    Marcial Pacas, MD  traMADol (ULTRAM) 50 MG tablet Take 50 mg by mouth 2 (two) times daily.  07/14/18   [provider]    Allergies    Ciprofloxacin, Levaquin [levofloxacin], Buspar [buspirone], Linaclotide, Advair diskus [fluticasone-salmeterol], Biaxin [clarithromycin], and Hydroxyzine  Review of Systems   Review of Systems  Psychiatric/Behavioral: Positive for suicidal ideas.  All other systems reviewed and are negative.   Physical Exam Updated Vital Signs BP 118/72 (BP Location: Left Arm)   Pulse 70   Temp 98.1 F (36.7 C) (Oral)   Resp 18   LMP 03/23/2017 (Approximate)   SpO2 94%   Physical Exam Vitals and nursing note reviewed.  Constitutional:      Appearance: She is well-developed.     Comments: Head resting on bedside table, crying  HENT:     Head: Normocephalic and atraumatic.  Eyes:     Conjunctiva/sclera: Conjunctivae normal.     Pupils: Pupils are equal, round, and reactive to light.  Cardiovascular:     Rate and Rhythm: Normal rate and regular rhythm.     Heart sounds: Normal heart sounds.  Pulmonary:     Effort: Pulmonary effort is normal.     Breath sounds: Normal breath sounds.  Abdominal:     General: Bowel sounds are normal.     Palpations: Abdomen is soft.     Tenderness: There is no abdominal tenderness. There is no left CVA tenderness.  Musculoskeletal:        General: Normal range of motion.     Cervical back: Normal range of motion.  Skin:    General: Skin is warm and dry.  Neurological:     Mental Status: She is alert and oriented to person, place, and time.     ED Results / Procedures / Treatments   Labs (all labs ordered  are listed, but only abnormal results are displayed) Labs Reviewed  COMPREHENSIVE METABOLIC PANEL - Abnormal; Notable for the following components:      Result Value   Glucose, Bld 176 (*)    BUN 31 (*)    Creatinine, Ser 2.12 (*)    Albumin 3.3 (*)    AST 14 (*)    GFR calc non Af Amer 27 (*)    GFR calc  Af Amer 31 (*)    All other components within normal limits  SALICYLATE LEVEL - Abnormal; Notable for the following components:   Salicylate Lvl <5.8 (*)    All other components within normal limits  ACETAMINOPHEN LEVEL - Abnormal; Notable for the following components:   Acetaminophen (Tylenol), Serum <10 (*)    All other components within normal limits  RESPIRATORY PANEL BY RT PCR (FLU A&B, COVID)  ETHANOL  CBC  RAPID URINE DRUG SCREEN, HOSP PERFORMED  PREGNANCY, URINE    EKG None  Radiology No results found.  Procedures Procedures (including critical care time)  Medications Ordered in ED Medications - No data to display  ED Course  I have reviewed the triage vital signs and the nursing notes.  Pertinent labs & imaging results that were available during my care of the patient were reviewed by me and considered in my medical decision making (see chart for details).    MDM Rules/Calculators/A&P  49 year old female presenting to the ED from behavioral health for medical clearance and holding for psychiatric admission.  She presented there today due to worsening depression and suicidal thoughts.  She does express hopelessness and lack of support.  She denies any homicidal ideation.  No hallucinations.  Denies any physical complaints.  She is a type I diabetic with insulin pump.  Labs as above, chronic kidney disease appears at her baseline.  Her sugar is 176 but with normal anion gap of 9.  No concern for DKA at this time.  Patient is medically cleared.  Her rapid Covid screen is negative.  Home meds have been ordered.  She is awaiting psychiatric placement.  Final Clinical Impression(s) / ED Diagnoses Final diagnoses:  Suicidal ideation  Medical clearance for psychiatric admission    Rx / DC Orders ED Discharge Orders    None       Larene Pickett, PA-C 09/12/19 3094    Merryl Hacker, MD 09/12/19 [phone]
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
## 2019-09-12 NOTE — Discharge Instructions (Signed)
For your behavioral health needs, you are advised to continue treatment with your current outpatient providers:       Norman Clay, MD      [facility]. [address], [address] [place], [place] [address]      [phone]       Alethia Berthold, MD      [facility]      [address] [place], [place], [place] [address]      [phone]

## 2019-09-12 NOTE — H&P (Signed)
Behavioral Health Medical Screening Exam  Jasmine [name] is an 49 y.o. female presents to [facility] with MDD. Patient has been feeling extremely depressed for the past few weeks and has been feeling suicidal.  Patient does see a therapist weekly and has a psychiatrist who prescribes her psychiatric medications.  At this time, patient cannot contract for safety.  Patient has a foot brace, wears oxygen all day and has a diabetic pump.  Total Time spent with patient: 45 minutes  Psychiatric Specialty Exam: Physical Exam  Nursing note and vitals reviewed. Constitutional: She is oriented to person, place, and time. She appears well-developed and well-nourished.  HENT:  Head: Normocephalic.  Respiratory: Effort normal and breath sounds normal.  Musculoskeletal:        General: Normal range of motion.  Neurological: She is alert and oriented to person, place, and time.  Skin: Skin is warm and dry.  Psychiatric: Her mood appears anxious. Her speech is delayed. She is withdrawn. Cognition and memory are normal. She expresses impulsivity and inappropriate judgment. She exhibits a depressed mood. She expresses suicidal ideation.    Review of Systems  Psychiatric/Behavioral: Positive for dysphoric mood and suicidal ideas. Negative for confusion and hallucinations. The patient is nervous/anxious. The patient is not hyperactive.   All other systems reviewed and are negative.   Blood pressure 126/74, pulse 74, temperature 98.6 F (37 C), resp. rate 20, last menstrual period 03/23/2017, SpO2 97 %.There is no height or weight on file to calculate BMI.  General Appearance: Casual  Eye Contact:  Minimal  Speech:  Normal Rate  Volume:  Decreased  Mood:  Depressed and Dysphoric  Affect:  Congruent  Thought Process:  Coherent and Descriptions of Associations: Intact  Orientation:  Full (Time, Place, and Person)  Thought Content:  WDL  Suicidal Thoughts:  Yes.  without intent/plan  Homicidal Thoughts:  No   Memory:  Immediate;   Good  Judgement:  Impaired  Insight:  Lacking  Psychomotor Activity:  Normal  Concentration: Concentration: Fair  Recall:  [place] of Knowledge:Fair  Language: Fair  Akathisia:  NA  Handed:  Right  AIMS (if indicated):     Assets:  Desire for Improvement Social Support  Sleep:   pt not sleeping     Musculoskeletal: Strength & Muscle Tone: within normal limits Gait & Station: Patient has a right foot brace Patient leans: N/A  Blood pressure 126/74, pulse 74, temperature 98.6 F (37 C), resp. rate 20, last menstrual period 03/23/2017, SpO2 97 %.  Recommendations:  Based on my evaluation the patient does not appear to have an emergency medical condition. recommend psychiatric inpatient. patient will be sent to [facility] long due to [facility] not having appropriate beds  Deloria Lair, NP 09/12/2019, 1:53 AM

## 2019-09-13 ENCOUNTER — Ambulatory Visit (INDEPENDENT_AMBULATORY_CARE_PROVIDER_SITE_OTHER): Payer: BC Managed Care – PPO | Admitting: Adult Health

## 2019-09-13 ENCOUNTER — Other Ambulatory Visit: Payer: Self-pay

## 2019-09-13 DIAGNOSIS — G4733 Obstructive sleep apnea (adult) (pediatric): Secondary | ICD-10-CM

## 2019-09-13 DIAGNOSIS — R0689 Other abnormalities of breathing: Secondary | ICD-10-CM

## 2019-09-13 DIAGNOSIS — R0600 Dyspnea, unspecified: Secondary | ICD-10-CM

## 2019-09-13 NOTE — Patient Instructions (Signed)
Continue on oxygen at 3 L Portable oxygen concentrator order sent to your local DME Emergency room records have been requested Activity as tolerated O2 saturation goal is to be greater than 90% Follow-up in the office in 4 weeks with Dr. Elsworth Soho and As needed   Please contact office for sooner follow up if symptoms do not improve or worsen or seek emergency care

## 2019-09-13 NOTE — Progress Notes (Signed)
Virtual Visit via Telephone Note  I connected with Jasmine [name] on 09/13/19 at  3:30 PM EDT by telephone and verified that I am speaking with the correct person using two identifiers.  Location: Patient: Home  Provider: Home   I discussed the limitations, risks, security and privacy concerns of performing an evaluation and management service by telephone and the availability of in person appointments. I also discussed with the patient that there may be a patient responsible charge related to this service. The patient expressed understanding and agreed to proceed.   History of Present Illness: 49 year old female former  smoker followed for dyspnea and pulmonary nodules Medical history significant for obstructive sleep apnea CPAP intolerant    Today's televisit is a follow-up from recent emergency room visit Patient says she was seen at local emergency room for elevated blood sugars and was noticed to have some low oxygen levels.  She was started on oxygen from the emergency room she is currently on 3 L of oxygen.  She would like a portable oxygen concentrator to be sent because it is hard for her to lift the heavy oxygen tanks.  She says it is helping with her shortness of breath.  She said this has been an ongoing problem for her for the last 10 to 12 years.  She has been on oxygen in the past.  She denies any increased cough congestion or leg swelling.  She has not received her Covid vaccine yet.  Patient education was given  Patient does have underlying sleep apnea but is CPAP intolerant.  Patient Active Problem List   Diagnosis Date Noted  . Marital estrangement 06/19/2019  . Peripheral neuropathy 02/15/2019  . Dizziness 09/27/2018  . Polypharmacy 09/27/2018  . Severe recurrent major depression without psychotic features ([place]) 08/30/2018  . Bipolar 2 disorder, major depressive episode ([place]) 07/10/2018  . MDD (major depressive disorder), recurrent, severe, with psychosis ([place])  07/10/2018  . CKD (chronic kidney disease), stage IV ([place]) 07/01/2018  . Transient Hypoglycemia 07/01/2018  . Chronic migraine 01/17/2017  . Diabetic peripheral neuropathy ([place]) 01/17/2017  . OSA (obstructive sleep apnea) 07/27/2016  . Wheezing 07/27/2016  . Hyperglycemia 12/25/2012  . Acute on chronic renal failure ([place]) 12/25/2012  . Pulmonary infiltrates 10/02/2012  . Postnasal drip 10/02/2012  . Hyperkalemia 09/25/2012  . Morbid obesity ([place]) 09/24/2012  . HTN (hypertension) 09/24/2012  . Bipolar II disorder ([place]) 09/24/2012  . URI (upper respiratory infection) 09/24/2012  . DKA, type 1 ([place]) 11/04/2011  . Gastroenteritis 11/03/2011  . DM (diabetes mellitus), type 1, uncontrolled ([place]) 11/03/2011  . Hyponatremia 11/03/2011  . Elevated lipase 11/03/2011  . Hypothyroidism 11/03/2011  . Tobacco abuse 11/03/2011  . SUI (stress urinary incontinence, female) 08/16/2011    Current Outpatient Medications on File Prior to Visit  Medication Sig Dispense Refill  . APPLE CIDER VINEGAR PO Take 1 tablet by mouth daily.    Marland Kitchen aspirin EC 81 MG tablet Take 81 mg by mouth at bedtime.     Marland Kitchen BIOTIN PO Take 1 tablet by mouth daily.    . botulinum toxin Type A (BOTOX) 100 units SOLR injection Inject 200 Units into the muscle every 3 (three) months. 200 Units 3  . buPROPion (WELLBUTRIN XL) 300 MG 24 hr tablet Take 1 tablet (300 mg total) by mouth daily. 30 tablet 1  . carvedilol (COREG) 25 MG tablet Take 25 mg by mouth 2 (two) times daily with a meal.    . Cholecalciferol (VITAMIN D3 SUPER  STRENGTH) 50 MCG (2000 UT) TABS Take 1 tablet by mouth daily.     . Coenzyme Q10 (COQ-10 PO) Take 1 capsule by mouth daily.    Marland Kitchen dicyclomine (BENTYL) 10 MG capsule Take 10 mg by mouth 3 (three) times daily as needed for spasms.    . DULoxetine (CYMBALTA) 60 MG capsule Take 1 capsule (60 mg total) by mouth 2 (two) times daily. 60 capsule 1  . fluticasone (FLONASE) 50 MCG/ACT nasal spray Place 2 sprays into the nose  daily as needed for allergies.     . folic acid (FOLVITE) 644 MCG tablet Take 400 mcg by mouth every evening.     . furosemide (LASIX) 20 MG tablet Take 20 mg by mouth daily.     Marland Kitchen gabapentin (NEURONTIN) 100 MG capsule Take 300 mg by mouth at bedtime.    Marland Kitchen HYDROcodone-acetaminophen (NORCO/VICODIN) 5-325 MG tablet Take one tab po q 4 hrs prn pain (Patient taking differently: Take 0.5 tablets by mouth every 4 (four) hours as needed for moderate pain. ) 6 tablet 0  . insulin glargine (LANTUS) 100 UNIT/ML injection Inject 0.5 mLs (50 Units total) into the skin daily. (Patient taking differently: Inject 50 Units into the skin daily as needed (when pump is not working). ) 10 mL 11  . lamoTRIgine (LAMICTAL) 200 MG tablet Take 1 tablet (200 mg total) by mouth daily. 30 tablet 1  . levothyroxine (SYNTHROID, LEVOTHROID) 150 MCG tablet Take 150 mcg by mouth daily before breakfast.    . LORazepam (ATIVAN) 0.5 MG tablet Take 1 tablet (0.5 mg total) by mouth 2 (two) times daily as needed for anxiety. 60 tablet 1  . lubiprostone (AMITIZA) 8 MCG capsule Take 8 mcg by mouth as needed for constipation.     Marland Kitchen NOVOLOG 100 UNIT/ML injection Inject 2.425-2.95 Units into the skin continuous. Via pump 7pm to midnight 2.950 8:30am -7pm2.925 Midnight - 8:30 2.425  2  . Omega-3 Fatty Acids (FISH OIL PO) Take 1 capsule by mouth daily.    Marland Kitchen omeprazole (PRILOSEC) 20 MG capsule Take 20 mg by mouth at bedtime.     . ondansetron (ZOFRAN ODT) 4 MG disintegrating tablet Take 1 tablet (4 mg total) by mouth every 8 (eight) hours as needed. (Patient taking differently: Take 4 mg by mouth every 8 (eight) hours as needed for nausea or vomiting. ) 20 tablet 6  . pravastatin (PRAVACHOL) 80 MG tablet Take 80 mg by mouth at bedtime.    Marland Kitchen PROAIR HFA 108 (90 Base) MCG/ACT inhaler Inhale 1-2 puffs into the lungs every 6 (six) hours as needed for wheezing or shortness of breath.   1  . psyllium (REGULOID) 0.52 g capsule Take 0.52 g by mouth  daily.    . traMADol (ULTRAM) 50 MG tablet Take 50 mg by mouth 2 (two) times daily.     . TURMERIC PO Take 1 tablet by mouth daily.     No current facility-administered medications on file prior to visit.     Observations/Objective: CT chest April 24, 2019 showed stable bilateral pulmonary nodules measuring up to 6 mm in the right middle lobe unchanged from April 2018 consistent with a benign etiology   Assessment and Plan: New onset hypoxemia questionable etiology.  Records will be requested from local emergency room visit.  Patient is to continue on oxygen at 3 L.  We will have her follow back up in the office in a few weeks to evaluate oxygen needs and adjustments if needed.  O2 saturation goals are greater than 90%.. ER records requested  Obstructive sleep apnea CPAP intolerant  Plan  Patient Instructions  Continue on oxygen at 3 L Portable oxygen concentrator order sent to your local DME Emergency room records have been requested Activity as tolerated O2 saturation goal is to be greater than 90% Follow-up in the office in 4 weeks with Dr. Elsworth Soho and As needed   Please contact office for sooner follow up if symptoms do not improve or worsen or seek emergency care        Follow Up Instructions:    I discussed the assessment and treatment plan with the patient. The patient was provided an opportunity to ask questions and all were answered. The patient agreed with the plan and demonstrated an understanding of the instructions.   The patient was advised to call back or seek an in-person evaluation if the symptoms worsen or if the condition fails to improve as anticipated.  I provided 23 minutes of non-face-to-face time during this encounter.   Rexene Edison, NP

## 2019-09-14 ENCOUNTER — Telehealth: Payer: Self-pay | Admitting: Adult Health

## 2019-09-14 DIAGNOSIS — R0600 Dyspnea, unspecified: Secondary | ICD-10-CM

## 2019-09-14 NOTE — Telephone Encounter (Signed)
Pt had a televisit with Jasmine [name] on 09/13/2019. Per pt's AVS instructions, she is to follow up with Dr. Elsworth Soho in 4 weeks. An order needs to placed for a POC as well. We need to know who the pt's DME company is.  LMTCB x1 for pt.

## 2019-09-14 NOTE — Telephone Encounter (Signed)
Pt called back and uses [employer].  Please advise.

## 2019-09-14 NOTE — Telephone Encounter (Signed)
POC order has been placed. Jasmine [name] has scheduled the ROV.

## 2019-09-17 ENCOUNTER — Other Ambulatory Visit: Payer: Self-pay

## 2019-09-17 ENCOUNTER — Encounter: Payer: Self-pay | Admitting: Psychiatry

## 2019-09-17 ENCOUNTER — Ambulatory Visit (INDEPENDENT_AMBULATORY_CARE_PROVIDER_SITE_OTHER): Payer: BC Managed Care – PPO | Admitting: Psychiatry

## 2019-09-17 DIAGNOSIS — F3181 Bipolar II disorder: Secondary | ICD-10-CM | POA: Diagnosis not present

## 2019-09-17 NOTE — Progress Notes (Signed)
ECT consult: This was an ECT consultation for this 49 year old woman with chronic and recurrent depression.  Patient was referred for consultation by her outpatient psychiatrist Dr. Modesta Messing.  Patient was reached by telephone at 1:00.  Patient was ready for the appointment expecting the appointment and appropriate in the interview.  I also reviewed the chart and cross checked some details with the patient.  Patient reports that she has chronic depression which has been diagnosed as part of bipolar disorder.  Current symptoms include feeling sad negative and unmotivated essentially all of the time.  She feels like her energy level is poor constantly.  She feels she has little interest or motivation in daily activities.  She reports having trouble staying asleep and usually sleeping only about 4 hours a night.  Appetite is stable.  Patient denies hallucinations.  She does report intermittent suicidal ideation without any current plan or intention to act on it.  Patient feels like she has difficulty concentrating.  Additionally she complains of anxiety attacks with occasional panic-like features unpredictable.  Patient is currently seeing an outpatient psychiatrist and speaks to a therapist regularly.  She is on psychiatric medicine currently consisting of bupropion, Cymbalta, lamotrigine and a small amount of Ativan.  Life stresses include what sounds like some chronic tension in her home life, and the burden of multiple chronic illnesses she suffers.  Patient denies alcohol or drug abuse.  She says she thinks that of all the medicine she has taken her current lamotrigine has worked the best but it is still providing only slight improvement and she remains very disabled.  Past psychiatric history of mood symptoms going back to adolescent years.  She says that she has episodes of what she describes as mania.  During those she says she will engage in behaviors that she later regrets.  Looking at the chart she tells me  that she was having a mania all through January but there are notes from January but do not describe a severe or psychotic mania.  Most likely diagnosis sounds like it may be bipolar disorder type II.  Patient reports she has been on multiple medications including Geodon, Seroquel, multiple antidepressants mostly serotonin reuptake inhibitors, amitriptyline, Valium.  She is currently on bupropion and Cymbalta lamotrigine and Ativan.  Patient says she had a serious suicide attempt about 20 years ago.  She has had hospitalizations in the past and had recently been evaluated in the emergency room but ultimately not admitted.  She denies any history of violence.  She has never had ECT in the past.  Medical history notable for type 1 diabetes.  Patient is on an insulin pump.  Additionally she is oxygen dependent for somewhat unclear reasons possibly related to benign lung nodules.  She has no history of heart attack or stroke.  No history of surgery on her head or brain.  She does have a history of having had general anesthesia several times in the past without complication.  Social history: Patient is married lives with her husband and her father.  No children.  Patient does not work during the day and mostly spends her time doing very little at home.  Mental status exam: Patient was alert and appropriate throughout the interview.  She asked appropriate questions and followed along with the description.  She reports depressed mood and has a somewhat flat tone to her voice.  There was no sign of loosening of association or bizarre or delusional thinking.  She denied any current suicidal  or homicidal thought.  Patient appears to have adequate short-term memory to the conversation and reasonable appropriate ability to make decisions about her care.  This is a 49 year old woman with chronic recurrent depression possible bipolar 2 who is currently having multiple symptoms of severe depression which are impairing to  her functioning.  She is on appropriate medication treatment with only partial response.  Also receives psychotherapy compatible with what is usually available in the community.  Patient has been referred by her outpatient psychiatrist for consideration of ECT.  I described ECT both in general and in the specifics of our clinic to the patient.  We discussed potential side effects including both immediate headache nausea and confusion as well as the risk small though it may be of arrhythmia or cardiac arrest or stroke that could conceivably come from the increased blood pressure and pulse.  We discussed the side effect of short-term memory loss and how it is typically self-limited after the end of treatment but varies in how long it takes before it returns.  Patient was told that the chance of response to ECTs perhaps is in the neighborhood of 50% given her chronic recurrent depression.  She was given ample opportunity to ask questions about the treatment.  Patient states that she would like to pursue ECT.  We have made a tentative plan to try to begin treatment if possible next Monday the 19th.  I have faxed the order form for the usual laboratory testing to preadmission labs and also explained to the patient she needs a coronavirus test.  I will be sending a message to our nursing staff about the current plan.  Patient aware that she can call back if she has any questions or changes her mind.

## 2019-09-18 ENCOUNTER — Telehealth: Payer: Self-pay | Admitting: Internal Medicine

## 2019-09-18 NOTE — Telephone Encounter (Signed)
Called and spoke with pt letting her know the info stated from Dr. Valeta Harms and stated to her that the appt that is scheduled for her to see Dr. Elsworth Soho in [place] on 4/30 is the best appropriate appt she needs to keep and pt verbalized understanding.nothing further needed.

## 2019-09-18 NOTE — Telephone Encounter (Signed)
This is fine 

## 2019-09-18 NOTE — Telephone Encounter (Signed)
MR, please advise if you are okay with pt switching to Dr. Valeta Harms and Dr. Valeta Harms, please also verify that you are also fine with this switch.

## 2019-09-18 NOTE — Telephone Encounter (Signed)
She has OSA and is appropriately scheduled to see a sleep doc, Dr. Elsworth Soho already Thanks Garner Nash, DO [facility] 09/18/2019 4:20 PM

## 2019-09-19 ENCOUNTER — Other Ambulatory Visit: Payer: Self-pay

## 2019-09-19 ENCOUNTER — Encounter
Admission: RE | Admit: 2019-09-19 | Discharge: 2019-09-19 | Disposition: A | Discharge status: Home / Self Care | Payer: BC Managed Care – PPO | Source: Ambulatory Visit | Attending: Psychiatry | Admitting: Psychiatry

## 2019-09-19 ENCOUNTER — Telehealth: Payer: Self-pay

## 2019-09-19 HISTORY — DX: Unspecified asthma, uncomplicated: J45.909

## 2019-09-19 HISTORY — DX: Charcot's joint, unspecified ankle and foot: M14.679

## 2019-09-19 HISTORY — DX: Unspecified osteoarthritis, unspecified site: M19.90

## 2019-09-19 HISTORY — DX: Chronic kidney disease, unspecified: N18.9

## 2019-09-20 ENCOUNTER — Inpatient Hospital Stay: Admission: RE | Admit: 2019-09-20 | Payer: BC Managed Care – PPO | Source: Ambulatory Visit

## 2019-09-20 ENCOUNTER — Other Ambulatory Visit: Payer: BC Managed Care – PPO

## 2019-09-21 ENCOUNTER — Telehealth: Payer: Self-pay

## 2019-09-24 ENCOUNTER — Telehealth: Payer: Self-pay
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
## 2019-10-01 ENCOUNTER — Other Ambulatory Visit: Payer: Self-pay

## 2019-10-01 ENCOUNTER — Encounter: Payer: Self-pay | Admitting: Neurology

## 2019-10-01 ENCOUNTER — Ambulatory Visit (INDEPENDENT_AMBULATORY_CARE_PROVIDER_SITE_OTHER): Payer: BC Managed Care – PPO | Admitting: Neurology

## 2019-10-01 VITALS — BP 134/69 | HR 61 | Temp 97.6°F | Ht 69.0 in | Wt 314.0 lb

## 2019-10-01 DIAGNOSIS — E1142 Type 2 diabetes mellitus with diabetic polyneuropathy: Secondary | ICD-10-CM | POA: Diagnosis not present

## 2019-10-01 DIAGNOSIS — F32A Depression, unspecified: Secondary | ICD-10-CM | POA: Insufficient documentation

## 2019-10-01 DIAGNOSIS — F329 Major depressive disorder, single episode, unspecified: Secondary | ICD-10-CM

## 2019-10-01 DIAGNOSIS — G43709 Chronic migraine without aura, not intractable, without status migrainosus: Secondary | ICD-10-CM

## 2019-10-01 DIAGNOSIS — R413 Other amnesia: Secondary | ICD-10-CM

## 2019-10-01 DIAGNOSIS — IMO0002 Reserved for concepts with insufficient information to code with codable children: Secondary | ICD-10-CM

## 2019-10-01 DIAGNOSIS — Z79899 Other long term (current) drug therapy: Secondary | ICD-10-CM

## 2019-10-01 MED ORDER — AIMOVIG 70 MG/ML ~~LOC~~ SOAJ: 70.0000 mg | SUBCUTANEOUS | 11 refills | Status: DC
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
## 2019-10-02 ENCOUNTER — Telehealth: Payer: Self-pay | Admitting: Neurology

## 2019-10-02 LAB — HGB A1C W/O EAG: Hgb A1c MFr Bld: 7.6 % — ABNORMAL HIGH (ref 4.8–5.6)

## 2019-10-02 LAB — C-REACTIVE PROTEIN: CRP: 2 mg/L (ref 0–10)

## 2019-10-02 LAB — FOLATE: Folate: >20 ng/mL (ref >3.0)

## 2019-10-02 LAB — TSH: TSH: 4.71 u[IU]/mL — ABNORMAL HIGH (ref 0.450–4.500)

## 2019-10-02 LAB — SEDIMENTATION RATE: Sed Rate: 81 mm/hr — ABNORMAL HIGH (ref 0–32)

## 2019-10-02 LAB — ANA W/REFLEX IF POSITIVE: Anti Nuclear Antibody (ANA): NEGATIVE

## 2019-10-02 LAB — HIV ANTIBODY (ROUTINE TESTING W REFLEX): HIV Screen 4th Generation wRfx: NONREACTIVE

## 2019-10-02 LAB — VITAMIN B12: Vitamin B-12: 606 pg/mL (ref 232–1245)

## 2019-10-02 LAB — RPR: RPR Ser Ql: NONREACTIVE

## 2019-10-02 NOTE — Telephone Encounter (Signed)
Please call patient, laboratory evaluation showed A1c 7, consistent with her history of diabetes,  Significantly elevated ESR of 81, normal C-reactive protein of 2, ask her if she had any signs of active infection such as sinus infection, upper respiratory, UTI, if she has signs of infection, she should talk with her primary care physician for further evaluation, I have faxed the laboratory result to her primary care physician May consider repeat laboratory evaluation later  Rest of the laboratory evaluation showed no significant abnormality,

## 2019-10-02 NOTE — Telephone Encounter (Signed)
I spoke to the patient and he has been informed of her lab results. Denies any signs or symptoms of infections. She plans to follow with her PCP and endocrinologist.

## 2019-10-04 ENCOUNTER — Telehealth (INDEPENDENT_AMBULATORY_CARE_PROVIDER_SITE_OTHER): Payer: BC Managed Care – PPO | Admitting: Psychiatry

## 2019-10-04 ENCOUNTER — Encounter (HOSPITAL_COMMUNITY): Payer: Self-pay | Admitting: Psychiatry

## 2019-10-04 ENCOUNTER — Telehealth (HOSPITAL_COMMUNITY): Payer: Self-pay | Admitting: Psychiatry

## 2019-10-04 ENCOUNTER — Other Ambulatory Visit: Payer: Self-pay

## 2019-10-04 DIAGNOSIS — F419 Anxiety disorder, unspecified: Secondary | ICD-10-CM | POA: Diagnosis not present

## 2019-10-04 DIAGNOSIS — Z560 Unemployment, unspecified: Secondary | ICD-10-CM | POA: Diagnosis not present

## 2019-10-04 DIAGNOSIS — F3181 Bipolar II disorder: Secondary | ICD-10-CM

## 2019-10-04 DIAGNOSIS — R45851 Suicidal ideations: Secondary | ICD-10-CM

## 2019-10-04 DIAGNOSIS — Z630 Problems in relationship with spouse or partner: Secondary | ICD-10-CM

## 2019-10-04 NOTE — Patient Instructions (Addendum)
1. Continue lamotrigine 200 mg daily  2.Continue duloxetine 60 mgdaily  3.Continuebupropion300 mg daily 4.Continuelorazepam 0.25-0.5 mg twice a day as needed for anxiety 5Next appointment:5/13 at 8:40   CONTACT INFORMATION  What to do if you need to get in touch with someone regarding a psychiatric issue:  1. EMERGENCY: For psychiatric emergencies (if you are suicidal or if there are any other safety issues) call 911 and/or go to your nearest Emergency Room immediately.   2. IF YOU NEED SOMEONE TO TALK TO RIGHT NOW: Given my clinical responsibilities, I may not be able to speak with you over the phone for a prolonged period of time.  a. You may always call The National Suicide Prevention Lifeline at 1-800-273-TALK (680)510-9269).  b. Your county of residence will also have local crisis services. For [facility]: [place] at [phone] ([place])

## 2019-10-04 NOTE — Telephone Encounter (Signed)
Sent link for video visit through Epic. Patient did not sign in. Called the patient  for appointment scheduled today. The patient did not answer the phone. Left voice message to contact the office.  

## 2019-10-05 ENCOUNTER — Other Ambulatory Visit: Payer: Self-pay

## 2019-10-05 ENCOUNTER — Ambulatory Visit (INDEPENDENT_AMBULATORY_CARE_PROVIDER_SITE_OTHER): Payer: BC Managed Care – PPO | Admitting: Pulmonary Disease

## 2019-10-05 ENCOUNTER — Telehealth: Payer: Self-pay

## 2019-10-05 ENCOUNTER — Encounter: Payer: Self-pay | Admitting: Pulmonary Disease

## 2019-10-05 VITALS — BP 132/72 | HR 61 | Temp 97.4°F | Ht 69.0 in | Wt 314.0 lb

## 2019-10-05 DIAGNOSIS — J9611 Chronic respiratory failure with hypoxia: Secondary | ICD-10-CM | POA: Diagnosis not present

## 2019-10-05 DIAGNOSIS — G4733 Obstructive sleep apnea (adult) (pediatric): Secondary | ICD-10-CM | POA: Diagnosis not present

## 2019-10-05 NOTE — Addendum Note (Signed)
Addended by: Norman Clay on: 10/05/2019 09:47 AM   Modules accepted: Level of Service

## 2019-10-05 NOTE — Assessment & Plan Note (Signed)
Does not seem to have significant COPD Does not  have hypercarbia from previous ABG, so this is not obesity hypoventilation. Likely related to fluid issues, take Lasix 20 mg daily We will reassess her oxygen status on future visits, meanwhile okay to use portable oxygen concentrator

## 2019-10-05 NOTE — Assessment & Plan Note (Signed)
She is willing to undertake CPAP now We will initiate repeat home sleep testing and based on results schedule CPAP titration study.  This will allow [place] an attempt at mask desensitization and also find out if she needs oxygen blended into CPAP
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
## 2019-10-05 NOTE — Patient Instructions (Signed)
  Ambulatory saturation Home sleep test - based on this , we will proceed with an in -lab titration study to find the right mask for you & see if you need oxygen

## 2019-10-08 ENCOUNTER — Other Ambulatory Visit: Payer: Self-pay

## 2019-10-08 ENCOUNTER — Ambulatory Visit
Admission: RE | Admit: 2019-10-08 | Discharge: 2019-10-08 | Disposition: A | Discharge status: Home / Self Care | Payer: BC Managed Care – PPO | Source: Ambulatory Visit | Attending: Psychiatry | Admitting: Psychiatry

## 2019-10-08 ENCOUNTER — Other Ambulatory Visit (INDEPENDENT_AMBULATORY_CARE_PROVIDER_SITE_OTHER): Payer: BC Managed Care – PPO | Admitting: Psychiatry

## 2019-10-08 ENCOUNTER — Ambulatory Visit: Payer: BC Managed Care – PPO

## 2019-10-08 ENCOUNTER — Ambulatory Visit: Admission: RE | Admit: 2019-10-08 | Payer: BC Managed Care – PPO | Source: Ambulatory Visit

## 2019-10-08 ENCOUNTER — Encounter
Admission: RE | Admit: 2019-10-08 | Discharge: 2019-10-08 | Disposition: A | Discharge status: Home / Self Care | Payer: BC Managed Care – PPO | Source: Ambulatory Visit | Attending: Psychiatry | Admitting: Psychiatry

## 2019-10-08 DIAGNOSIS — I10 Essential (primary) hypertension: Secondary | ICD-10-CM | POA: Insufficient documentation

## 2019-10-08 DIAGNOSIS — E119 Type 2 diabetes mellitus without complications: Secondary | ICD-10-CM

## 2019-10-08 DIAGNOSIS — J45909 Unspecified asthma, uncomplicated: Secondary | ICD-10-CM | POA: Insufficient documentation

## 2019-10-08 DIAGNOSIS — F3181 Bipolar II disorder: Secondary | ICD-10-CM | POA: Diagnosis not present

## 2019-10-08 NOTE — Progress Notes (Signed)
Patient arrived today to receive and ECG and X-ray as requested by Dr. Weber Cooks. During the visit this nurse reviewed the booklet What You Should Know About Electroconvulsive Therapy. I also discussed The New Patient Instructions and left her with a copy to take home. She verbalized understanding of the instructions and was able to point out the office phone number [phone] if she needed to cancel or reschedule for any reason. At the end of the conversation the patient and family member ( father) had no questions. She understands that she is to set up an appointment to receive a COVID test at [facility] on Friday May 14. MD notified.

## 2019-10-10 ENCOUNTER — Telehealth: Payer: Self-pay

## 2019-10-11 ENCOUNTER — Ambulatory Visit: Payer: BC Managed Care – PPO | Admitting: Internal Medicine
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
## 2019-10-12 ENCOUNTER — Other Ambulatory Visit: Payer: Self-pay | Admitting: Psychiatry

## 2019-10-12 ENCOUNTER — Telehealth: Payer: Self-pay

## 2019-10-17 ENCOUNTER — Other Ambulatory Visit: Payer: Self-pay

## 2019-10-17 ENCOUNTER — Ambulatory Visit: Payer: BC Managed Care – PPO | Attending: Internal Medicine

## 2019-10-17 DIAGNOSIS — Z20822 Contact with and (suspected) exposure to covid-19: Secondary | ICD-10-CM

## 2019-10-18 ENCOUNTER — Telehealth (HOSPITAL_COMMUNITY): Payer: BC Managed Care – PPO | Admitting: Psychiatry

## 2019-10-18 LAB — SARS-COV-2, NAA 2 DAY TAT

## 2019-10-18 LAB — NOVEL CORONAVIRUS, NAA: SARS-CoV-2, NAA: NOT DETECTED
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
## 2019-10-22 ENCOUNTER — Encounter: Payer: Self-pay | Admitting: Anesthesiology

## 2019-10-22 ENCOUNTER — Telehealth: Payer: Self-pay

## 2019-10-22 ENCOUNTER — Inpatient Hospital Stay: Admission: RE | Admit: 2019-10-22 | Payer: BC Managed Care – PPO | Source: Ambulatory Visit

## 2019-10-23 ENCOUNTER — Telehealth (HOSPITAL_COMMUNITY): Payer: BC Managed Care – PPO | Admitting: Psychiatry

## 2019-10-25 ENCOUNTER — Telehealth (INDEPENDENT_AMBULATORY_CARE_PROVIDER_SITE_OTHER): Payer: BC Managed Care – PPO | Admitting: Psychiatry

## 2019-10-25 ENCOUNTER — Encounter (HOSPITAL_COMMUNITY): Payer: Self-pay | Admitting: Psychiatry

## 2019-10-25 ENCOUNTER — Other Ambulatory Visit: Payer: Self-pay

## 2019-10-25 DIAGNOSIS — F3181 Bipolar II disorder: Secondary | ICD-10-CM

## 2019-10-25 DIAGNOSIS — F419 Anxiety disorder, unspecified: Secondary | ICD-10-CM

## 2019-10-25 MED ORDER — DULOXETINE HCL 60 MG PO CPEP: 60.0000 mg | ORAL_CAPSULE | Freq: Every day | ORAL | 1 refills | Status: DC

## 2019-10-25 MED ORDER — BUPROPION HCL ER (XL) 300 MG PO TB24: 300.0000 mg | ORAL_TABLET | Freq: Every day | ORAL | 1 refills | Status: DC

## 2019-10-25 MED ORDER — LAMOTRIGINE 200 MG PO TABS: 200.0000 mg | ORAL_TABLET | Freq: Every day | ORAL | 1 refills | Status: DC

## 2019-10-25 MED ORDER — LORAZEPAM 0.5 MG PO TABS: 0.5000 mg | ORAL_TABLET | Freq: Every day | ORAL | 1 refills | Status: DC | PRN

## 2019-10-25 NOTE — Patient Instructions (Signed)
1. Continue lamotrigine 200 mg daily  2.Continue duloxetine 60 mgdaily  3.Continuebupropion300 mg daily  4.Continuelorazepam 0.5mg  daily as needed for anxiety 5Next appointment:7/1 at 1 PM

## 2019-10-26 ENCOUNTER — Telehealth: Payer: Self-pay | Admitting: Adult Health

## 2019-10-26 ENCOUNTER — Telehealth: Payer: Self-pay | Admitting: Pulmonary Disease

## 2019-10-26 ENCOUNTER — Telehealth: Payer: Self-pay

## 2019-10-26 NOTE — Telephone Encounter (Signed)
Order was placed last month.

## 2019-10-26 NOTE — Telephone Encounter (Signed)
This looks like one Rodena Piety has pulled.

## 2019-10-29 NOTE — Telephone Encounter (Signed)
See response from [place] with [employer]  Theodora Blow, Ferd Hibbs, Georges Mouse; 1 other   Per our warehouse manager:   At this time we cannot submit for approval. [employer] was enrolled and credit card declined twice and still has not placed card on file for homefill.   Thanks!

## 2019-10-29 NOTE — Telephone Encounter (Signed)
See below [place] from [employer]'s response  Bobby Rumpf, Gabrielle Dare, Demetra Shiner, [place]; Verl Blalock; 1 other   POC's are on back order, however I have reached out to the warehouse manager checking on the status to see if we have received any in so we can supply one to this patient.

## 2019-10-29 NOTE — Telephone Encounter (Signed)
Jasmine [name] has been scheduled to pick up HST machine 10/30/2019 @ 11:00am and she is aware to come to the [facility]

## 2019-10-29 NOTE — Telephone Encounter (Signed)
LVM for Patient to return call to schedule this HST

## 2019-10-29 NOTE — Telephone Encounter (Signed)
I sent urgent message to [employer] asking about this  02 POC order when available. I have called the patient letting her know I have sent message to [employer]

## 2019-10-30 NOTE — Telephone Encounter (Signed)
Called and spoke with pt letting her know the info stated by Rodena Piety from [place]. Pt stated that she called [employer] yesterday 5/24 and gave them a credit card number.  Rodena Piety, can you please recheck with [employer] to see if all has been taken care of to see if pt might now be able to be approved for a POC or if they are still going to deny her.

## 2019-10-30 NOTE — Telephone Encounter (Signed)
Please see response from [place] with [employer]  Horald Chestnut   Thanks Rodena Piety for the information, I will let the manager know.

## 2019-10-31 NOTE — Telephone Encounter (Signed)
Sent message to [employer] to see if this has been taken care of Jasmine [name]

## 2019-11-01 NOTE — Telephone Encounter (Signed)
This POC issue is being worked on by Fish farm manager pt will be notified as soon as they know when she will be able to get it Joellen [place]

## 2019-11-06 ENCOUNTER — Other Ambulatory Visit: Payer: Self-pay

## 2019-11-06 ENCOUNTER — Ambulatory Visit: Payer: BC Managed Care – PPO | Attending: Internal Medicine

## 2019-11-06 DIAGNOSIS — Z20822 Contact with and (suspected) exposure to covid-19: Secondary | ICD-10-CM

## 2019-11-07 ENCOUNTER — Telehealth (HOSPITAL_COMMUNITY): Payer: Self-pay | Admitting: *Deleted

## 2019-11-07 LAB — SARS-COV-2, NAA 2 DAY TAT

## 2019-11-07 LAB — NOVEL CORONAVIRUS, NAA: SARS-CoV-2, NAA: NOT DETECTED

## 2019-11-07 NOTE — Telephone Encounter (Signed)
Patient called stating that her she don't know what to do. Per pt her brother just passed away last week at the age of 65 of cancer and her grandmother passed away 2019/11/13. Per pt her anxiety med is not working and she is having problems sleeping. Per pt she is not of danger to herself or anyone around her.

## 2019-11-08 ENCOUNTER — Other Ambulatory Visit (HOSPITAL_COMMUNITY): Payer: Self-pay | Admitting: Psychiatry

## 2019-11-08 DIAGNOSIS — F3181 Bipolar II disorder: Secondary | ICD-10-CM

## 2019-11-08 MED ORDER — LORAZEPAM 0.5 MG PO TABS: 0.5000 mg | ORAL_TABLET | Freq: Three times a day (TID) | ORAL | 1 refills | Status: DC

## 2019-11-08 NOTE — Telephone Encounter (Signed)
I have sent in extra prescription for ativan 0.5 mg so she can take it up to tid as needed.Please get her in with Dr. Modesta Messing next week. Has she made an appt with her therapist?

## 2019-11-08 NOTE — Telephone Encounter (Signed)
Spoke with patient and informed her with what provider stated and she verbalized understanding.

## 2019-11-08 NOTE — Telephone Encounter (Signed)
LMOM

## 2019-11-09 ENCOUNTER — Telehealth: Payer: Self-pay

## 2019-11-09 ENCOUNTER — Ambulatory Visit: Payer: BC Managed Care – PPO

## 2019-11-09 ENCOUNTER — Other Ambulatory Visit: Payer: Self-pay | Admitting: Psychiatry

## 2019-11-09 ENCOUNTER — Other Ambulatory Visit: Payer: Self-pay

## 2019-11-09 DIAGNOSIS — G4733 Obstructive sleep apnea (adult) (pediatric): Secondary | ICD-10-CM

## 2019-11-12 ENCOUNTER — Encounter: Payer: Self-pay | Admitting: Anesthesiology

## 2019-11-12 ENCOUNTER — Encounter
Admission: RE | Admit: 2019-11-12 | Discharge: 2019-11-12 | Disposition: A | Discharge status: Home / Self Care | Payer: BC Managed Care – PPO | Source: Ambulatory Visit | Attending: Psychiatry | Admitting: Psychiatry

## 2019-11-12 ENCOUNTER — Other Ambulatory Visit: Payer: Self-pay

## 2019-11-12 DIAGNOSIS — E1042 Type 1 diabetes mellitus with diabetic polyneuropathy: Secondary | ICD-10-CM | POA: Insufficient documentation

## 2019-11-12 DIAGNOSIS — R7989 Other specified abnormal findings of blood chemistry: Secondary | ICD-10-CM | POA: Diagnosis not present

## 2019-11-12 DIAGNOSIS — E039 Hypothyroidism, unspecified: Secondary | ICD-10-CM | POA: Diagnosis not present

## 2019-11-12 DIAGNOSIS — N184 Chronic kidney disease, stage 4 (severe): Secondary | ICD-10-CM | POA: Diagnosis not present

## 2019-11-12 DIAGNOSIS — G4733 Obstructive sleep apnea (adult) (pediatric): Secondary | ICD-10-CM | POA: Insufficient documentation

## 2019-11-12 DIAGNOSIS — E871 Hypo-osmolality and hyponatremia: Secondary | ICD-10-CM | POA: Insufficient documentation

## 2019-11-12 DIAGNOSIS — F332 Major depressive disorder, recurrent severe without psychotic features: Secondary | ICD-10-CM | POA: Diagnosis present

## 2019-11-12 DIAGNOSIS — R569 Unspecified convulsions: Secondary | ICD-10-CM | POA: Diagnosis not present

## 2019-11-12 DIAGNOSIS — E1022 Type 1 diabetes mellitus with diabetic chronic kidney disease: Secondary | ICD-10-CM | POA: Diagnosis not present

## 2019-11-12 LAB — GLUCOSE, CAPILLARY: Glucose-Capillary: 159 mg/dL — ABNORMAL HIGH (ref 70–99)

## 2019-11-12 MED ORDER — SODIUM CHLORIDE 0.9 % IV SOLN: 500.0000 mL | Freq: Once | INTRAVENOUS | Status: DC

## 2019-11-12 MED ORDER — SODIUM CHLORIDE 0.9 % IV SOLN: INTRAVENOUS | Status: DC | PRN

## 2019-11-12 MED ORDER — DEXTROSE 5 % IV SOLN: 500.0000 mL | Freq: Once | INTRAVENOUS | Status: DC

## 2019-11-12 MED ORDER — METHOHEXITAL SODIUM 100 MG/10ML IV SOSY
PREFILLED_SYRINGE | INTRAVENOUS | Status: DC | PRN
  Administered 2019-11-12: 140 mg via INTRAVENOUS

## 2019-11-12 MED ORDER — SUCCINYLCHOLINE CHLORIDE 200 MG/10ML IV SOSY
PREFILLED_SYRINGE | INTRAVENOUS | Status: AC
  Filled 2019-11-12: qty 10

## 2019-11-12 MED ORDER — ONDANSETRON HCL 4 MG/2ML IJ SOLN: 4.0000 mg | Freq: Once | INTRAMUSCULAR | Status: DC | PRN

## 2019-11-12 MED ORDER — MIDAZOLAM HCL 2 MG/2ML IJ SOLN
INTRAMUSCULAR | Status: DC | PRN
  Administered 2019-11-12: 2 mg via INTRAVENOUS

## 2019-11-12 MED ORDER — METHOHEXITAL SODIUM 0.5 G IJ SOLR
INTRAMUSCULAR | Status: AC
  Filled 2019-11-12: qty 500

## 2019-11-12 MED ORDER — SUCCINYLCHOLINE CHLORIDE 20 MG/ML IJ SOLN
INTRAMUSCULAR | Status: DC | PRN
  Administered 2019-11-12: 140 mg via INTRAVENOUS

## 2019-11-12 MED ORDER — MIDAZOLAM HCL 2 MG/2ML IJ SOLN
INTRAMUSCULAR | Status: AC
  Filled 2019-11-12: qty 2

## 2019-11-12 NOTE — Anesthesia Postprocedure Evaluation (Signed)
Anesthesia Post Note  Patient: RION SCHNITZER  Procedure(s) Performed: ECT TX  Patient location during evaluation: PACU Anesthesia Type: General Level of consciousness: awake and alert Pain management: pain level controlled Vital Signs Assessment: post-procedure vital signs reviewed and stable Respiratory status: spontaneous breathing, nonlabored ventilation and respiratory function stable ( ) Cardiovascular status: blood pressure returned to baseline and stable Postop Assessment: no apparent nausea or vomiting Anesthetic complications: no Comments:  Frequent brief desats to 70s and 80s while lying in semi-recumbent position.  After sitting up to chair and becoming more alert, maintaining SpO2 mid 90's.  Pt wears O2 at home while sleeping and PRN for shortness of breath.  States she will put oxygen on when she gets home if she is feeling sleepy.  Stable for PACU discharge.     Last Vitals:  Vitals:   11/12/19 1215 11/12/19 1230  BP:  120/72  Pulse: 62 62  Resp: 20 20  Temp:  36.7 C  SpO2: 96%     Last Pain:  Vitals:   11/12/19 1230  TempSrc: Oral  PainSc: 0-No pain                 Brett Canales Brendin Situ

## 2019-11-12 NOTE — H&P (Signed)
Jasmine [name] is an 48 y.o. female.   Chief Complaint: recurrent depression HPI: tired, depressed but not suicidal  Past Medical History:  Diagnosis Date  . Anxiety   . Arthritis   . Asthma   . Balance problems   . Bipolar disorder ([place])   . Charcot ankle   . Chronic fatigue   . Chronic kidney disease    STAGE 3-4  . Depression   . Diabetes mellitus   . DKA, type 1 (Newborn) 11/04/2011  . Elevated cholesterol   . Fibromyalgia   . GERD (gastroesophageal reflux disease)   . Headache   . History of suicidal ideation   . Hyperlipemia   . Hypertension   . Hypothyroidism   . IBS (irritable bowel syndrome)   . Memory changes   . Obesity   . Sleep apnea    HAS C -PAP / DOES NOT USE  . Stress incontinence    Pt had surgery to correct this.  . Tachycardia   . Tobacco abuse   . Tremor   . UTI (lower urinary tract infection)     Past Surgical History:  Procedure Laterality Date  . INCONTINENCE SURGERY    . NASAL FRACTURE SURGERY    . ovary removed    . OVARY SURGERY    . PUBOVAGINAL SLING  08/16/2011   Procedure: [name];  Surgeon: Bernestine Amass, MD;  Location: [facility];  Service: Urology;  Laterality: N/A;         . UTERINE FIBROID SURGERY  2001    Family History  Problem Relation Age of Onset  . Asthma Mother   . Bipolar disorder Mother   . Heart disease Father   . Lymphoma Father   . Hypertension Father   . Thyroid disease Father   . Hyperlipidemia Father   . Diabetes Father   . Cancer Paternal Grandmother        lung and breast  . Bladder Cancer Paternal Grandfather   . Suicidality Maternal Grandfather   . Thyroid disease Brother    Social History:  reports that she quit smoking about 8 years ago. Her smoking use included cigarettes. She has a 15.00 pack-year smoking history. She has never used smokeless tobacco. She reports that she does not drink alcohol or use drugs.  Allergies:  Allergies  Allergen Reactions  . Ciprofloxacin Swelling and  Other (See Comments)    Per pt caused lips swell and nauseous feeling  . Levaquin [Levofloxacin] Swelling and Other (See Comments)    Per pt caused lips swell and nauseous feeling  . Buspar [Buspirone] Other (See Comments)    abd cramping  . Linaclotide Other (See Comments)  . Advair Diskus [Fluticasone-Salmeterol] Other (See Comments)    Thrush   . Biaxin [Clarithromycin] Rash  . Hydroxyzine Palpitations    (Not in a hospital admission)   No results found for this or any previous visit (from the past 48 hour(s)). No results found.  Review of Systems  Constitutional: Negative.   HENT: Negative.   Eyes: Negative.   Respiratory: Negative.   Cardiovascular: Negative.   Gastrointestinal: Negative.   Musculoskeletal: Negative.   Skin: Negative.   Neurological: Negative.   Psychiatric/Behavioral: Positive for dysphoric mood. Negative for suicidal ideas.    Blood pressure 118/61, pulse 94, temperature 97.8 F (36.6 C), temperature source Oral, resp. rate 18, height 5\' 9"  (1.753 m), weight 136.1 kg, last menstrual period 03/23/2017, SpO2 97 %. Physical Exam  Nursing note and  vitals reviewed. Constitutional: She appears well-developed and well-nourished.  HENT:  Head: Normocephalic and atraumatic.  Eyes: Pupils are equal, round, and reactive to light. Conjunctivae are normal.  Cardiovascular: Regular rhythm and normal heart sounds.  Respiratory: Effort normal.  GI: Soft.  Musculoskeletal:        General: Normal range of motion.     Cervical back: Normal range of motion.  Neurological: She is alert.  Skin: Skin is warm and dry.  Psychiatric: Judgment normal. Her affect is blunt. Her speech is delayed. She is slowed. Cognition and memory are normal. She exhibits a depressed mood. She expresses no homicidal and no suicidal ideation.     Assessment/Plan Begin RUL ect today  Alethia Berthold, MD 11/12/2019, 10:01 AM

## 2019-11-12 NOTE — Transfer of Care (Signed)
Immediate Anesthesia Transfer of Care Note  Patient: Jasmine [name]  Procedure(s) Performed: ECT TX  Patient Location: PACU  Anesthesia Type:General  Level of Consciousness: drowsy and patient cooperative  Airway & Oxygen Therapy: Patient Spontanous Breathing and Patient connected to nasal cannula oxygen  Post-op Assessment: Report given to RN and Post -op Vital signs reviewed and stable  Post vital signs: Reviewed and stable  Last Vitals:  Vitals Value Taken Time  BP 172/93 11/12/19 1039  Temp    Pulse    Resp 20 11/12/19 1039  SpO2 90 % 11/12/19 1039    Last Pain:  Vitals:   11/12/19 0929  TempSrc:   PainSc: 0-No pain         Complications: No apparent anesthesia complications

## 2019-11-12 NOTE — Anesthesia Preprocedure Evaluation (Addendum)
Anesthesia Evaluation  Patient identified by MRN, date of birth, ID band Patient awake    Reviewed: Allergy & Precautions, H&P , NPO status , Patient's Chart, lab work & pertinent test results  Airway Mallampati: III  TM Distance: >3 FB Neck ROM: full    Dental  (+) Poor Dentition   Pulmonary shortness of breath and with exertion, asthma , sleep apnea and Oxygen sleep apnea , former smoker,  -Chronic respiratory failure with hypoxia - followed by Pulmonology, home O2 at night and as needed -Awaiting repeat sleep study results, does not currently use CPAP   breath sounds clear to auscultation       Cardiovascular hypertension,  Rhythm:regular Rate:Normal     Neuro/Psych  Headaches, PSYCHIATRIC DISORDERS Anxiety Depression Bipolar Disorder    GI/Hepatic Neg liver ROS, GERD  Controlled,  Endo/Other  diabetesHypothyroidism Morbid obesity  Renal/GU CRFRenal disease  negative genitourinary   Musculoskeletal   Abdominal   Peds  Hematology negative hematology ROS (+)   Anesthesia Other Findings Past Medical History: No date: Anxiety No date: Arthritis No date: Asthma No date: Balance problems No date: Bipolar disorder ([facility]) No date: Charcot ankle No date: Chronic fatigue No date: Chronic kidney disease     Comment:  STAGE 3-4 No date: Depression No date: Diabetes mellitus 11/04/2011: DKA, type 1 ([place]) No date: Elevated cholesterol No date: Fibromyalgia No date: GERD (gastroesophageal reflux disease) No date: Headache No date: History of suicidal ideation No date: Hyperlipemia No date: Hypertension No date: Hypothyroidism No date: IBS (irritable bowel syndrome) No date: Memory changes No date: Obesity No date: Sleep apnea     Comment:  HAS C -PAP / DOES NOT USE No date: Stress incontinence     Comment:  Pt had surgery to correct this. No date: Tachycardia No date: Tobacco abuse No date: Tremor No date: UTI  (lower urinary tract infection)  Past Surgical History: No date: INCONTINENCE SURGERY No date: NASAL FRACTURE SURGERY No date: ovary removed No date: OVARY SURGERY 08/16/2011: PUBOVAGINAL SLING     Comment:  Procedure: [name];  Surgeon: Bernestine Amass,              MD;  Location: [facility];  Service: Urology;  Laterality:               N/A;        2001: UTERINE FIBROID SURGERY  BMI    Body Mass Index: 44.30 kg/m      Reproductive/Obstetrics negative OB ROS                           Anesthesia Physical Anesthesia Plan  ASA: III  Anesthesia Plan: General   Post-op Pain Management:    Induction:   PONV Risk Score and Plan: Ondansetron  Airway Management Planned: Mask  Additional Equipment:   Intra-op Plan:   Post-operative Plan:   Informed Consent: I have reviewed the patients History and Physical, chart, labs and discussed the procedure including the risks, benefits and alternatives for the proposed anesthesia with the patient or authorized representative who has indicated his/her understanding and acceptance.     Dental Advisory Given  Plan Discussed with: Anesthesiologist, CRNA and Surgeon  Anesthesia Plan Comments:         Anesthesia Quick Evaluation

## 2019-11-12 NOTE — Discharge Instructions (Signed)
1)  The drugs that you have been given will stay in your system until tomorrow so for the       next 24 hours you should not:  A. Drive an automobile  B. Make any legal decisions  C. Drink any alcoholic beverages  2)  You may resume your regular meals upon return home.  3)  A responsible adult must take you home.  Someone should stay with you for a few          hours, then be available by phone for the remainder of the treatment day.  4)  You May experience any of the following symptoms:  Headache, Nausea and a dry mouth (due to the medications you were given),  temporary memory loss and some confusion, or sore muscles (a warm bath  should help this).  If you you experience any of these symptoms let us know on                your return visit.  5)  Report any of the following: any acute discomfort, severe headache, or temperature        greater than 100.5 F.   Also report any unusual redness, swelling, drainage, or pain         at your IV site.    You may report Symptoms to:  [place] [facility]          Phone: [phone], ECT Department           or Dr. Prescott Gum office [phone]  6)  Your next ECT Treatment is Wednesday June 9 at 8:30   We will call 2 days prior to your scheduled appointment for arrival times.  7)  Nothing to eat or drink after midnight the night before your procedure.  8)  Take      With a sip of water the morning of your procedure.  9)  Other Instructions: Call [phone] to cancel the morning of your procedure due         to illness or emergency.  10) We will call within 72 hours to assess how you are feeling.

## 2019-11-12 NOTE — OR Nursing (Signed)
Dr. Lubertha Basque at bedside. Notified of continuing fluctuating SAO2 75-97% without stimulation to take a deep breath. Patient states has home O2 and instructed that she will need to wear it today. Continues to fall asleep easily. Will assist to recliner and increase stimulation

## 2019-11-12 NOTE — Procedures (Signed)
[facility] Physicians Interval Evaluation & Treatment Note  Patient Identification: Jasmine [name] MRN:  [id number] Date of Evaluation:  11/12/2019 TX #: 1  MADRS:   MMSE:   P.E. Findings:  Overweight.  Right foot in a splint-like device.  Otherwise no specific findings  Psychiatric Interval Note:  Chronic depression.  Sad.  Feeling up especially bad after a couple of recent deaths in the family.  Subjective:  Patient is a 49 y.o. female seen for evaluation for Electroconvulsive Therapy. Depressed dysphoric low energy.  No suicidal thoughts no evidence of psychosis.  Treatment Summary:   [x]   Right Unilateral             []  Bilateral   % Energy : 0.3 ms 50%   Impedance: 1710 ohms  Seizure Energy Index: 8731 V squared  Postictal Suppression Index: 64%  Seizure Concordance Index: 95%  Medications  Pre Shock: 140 mg Brevital 140 mg succinylcholine  Post Shock: Versed 2 mg  Seizure Duration: 17 seconds EMG 24 seconds EEG   Comments: Follow-up next treatment on Wednesday with increased energy.  Lungs:  [x]   Clear to auscultation               []  Other:   Heart:    [x]   Regular rhythm             []  irregular rhythm    [x]   Previous H&P reviewed, patient examined and there are NO CHANGES                 []   Previous H&P reviewed, patient examined and there are changes noted.   Alethia Berthold, MD 6/7/20212:38 PM

## 2019-11-13 ENCOUNTER — Other Ambulatory Visit: Payer: Self-pay | Admitting: Psychiatry

## 2019-11-14 ENCOUNTER — Inpatient Hospital Stay: Admission: RE | Admit: 2019-11-14 | Payer: BC Managed Care – PPO | Source: Ambulatory Visit

## 2019-11-14 ENCOUNTER — Telehealth: Payer: Self-pay

## 2019-11-14 ENCOUNTER — Encounter: Payer: Self-pay | Admitting: Certified Registered Nurse Anesthetist

## 2019-11-16 ENCOUNTER — Telehealth: Payer: Self-pay

## 2019-11-20 ENCOUNTER — Telehealth: Payer: Self-pay | Admitting: Pulmonary Disease

## 2019-11-20 DIAGNOSIS — G4733 Obstructive sleep apnea (adult) (pediatric): Secondary | ICD-10-CM | POA: Diagnosis not present

## 2019-11-20 NOTE — Telephone Encounter (Signed)
Advised pt of results. Pt understood and nothing further is needed.   CPAP titration ordered.

## 2019-11-20 NOTE — Telephone Encounter (Signed)
HST showed severe OSA - worse than before - avg 37 events/ hr with drop in O2 levels Proceed with CPAP titration study

## 2019-11-22 ENCOUNTER — Other Ambulatory Visit (HOSPITAL_BASED_OUTPATIENT_CLINIC_OR_DEPARTMENT_OTHER): Payer: Self-pay

## 2019-11-28 ENCOUNTER — Encounter (HOSPITAL_COMMUNITY): Payer: Self-pay | Admitting: *Deleted

## 2019-11-28 ENCOUNTER — Telehealth (HOSPITAL_COMMUNITY): Payer: Self-pay | Admitting: *Deleted

## 2019-11-28 NOTE — Telephone Encounter (Signed)
Patient called and stated that her therapist Dr. Raynald Kemp will be going on vacation for 8 weeks and she instructed her to call Dr. Modesta Messing to see if she can find her a therapist during that time. Patient number is [phone].

## 2019-11-28 NOTE — Telephone Encounter (Signed)
Informed patient with information and she verbalized understanding.  

## 2019-11-28 NOTE — Telephone Encounter (Signed)
I would not recommend it as it would be more beneficial for her to have long term therapy especially given she already has an established therapy relationship with Dr. Raynald Kemp. I believe our therapists would not have availability that soon anyway. I see that she has a follow up with me in a few weeks. Hopefully we can address it more at the next visit.

## 2019-11-28 NOTE — Telephone Encounter (Signed)
Noted. Message sent to pt mychart. And she is aware.
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
## 2019-12-05 ENCOUNTER — Telehealth (HOSPITAL_COMMUNITY): Payer: Self-pay | Admitting: *Deleted

## 2019-12-05 ENCOUNTER — Encounter (HOSPITAL_COMMUNITY): Payer: Self-pay | Admitting: *Deleted

## 2019-12-05 NOTE — Telephone Encounter (Signed)
Informed patient with what provider stated and she verbalized understanding and stated she is interested.

## 2019-12-05 NOTE — Telephone Encounter (Signed)
I would like Jasmine [name] to receive specialized treatment/DBT if she were to seek a new therapist. Could you ask her if she is interested in being see at [facility] in [place]? It seems like they are offering virtual visit. If interested, advise her to contact this number [phone] to make an appointment.

## 2019-12-05 NOTE — Telephone Encounter (Signed)
LMOM

## 2019-12-05 NOTE — Telephone Encounter (Signed)
Dr. Raynald Kemp called to inform Dr. Modesta Messing she do not know when she will be returning to office due to a horseback riding accident. Per Dr. Raynald Kemp she wants patient to be followed by another therapist and she worry's more about this patient not having therapy. Staff asked provider to please send notes to help our therapist at this office know how to better help this patient in her absents instead of starting from scratch. Per Dr. Denice Paradise she do not have any notes to send and is in the hospital and do not have anymore to bring her laptop to her to be able to send notes. Per provider she needs this patient to be seen by another therapist and is concern about patient well being and Dr. Modesta Messing knows patient situation. Dr. Raynald Kemp number is [phone]. Per Dr. Raynald Kemp, in previous conversation with patient, pt informed her with what Dr. Modesta Messing response was and she dont know when she will return to office due to her fractures

## 2019-12-06 ENCOUNTER — Telehealth (INDEPENDENT_AMBULATORY_CARE_PROVIDER_SITE_OTHER): Payer: BC Managed Care – PPO | Admitting: Psychiatry

## 2019-12-06 ENCOUNTER — Encounter (HOSPITAL_COMMUNITY): Payer: Self-pay | Admitting: *Deleted

## 2019-12-06 ENCOUNTER — Encounter (HOSPITAL_COMMUNITY): Payer: Self-pay | Admitting: Psychiatry

## 2019-12-06 ENCOUNTER — Other Ambulatory Visit: Payer: Self-pay

## 2019-12-06 DIAGNOSIS — F419 Anxiety disorder, unspecified: Secondary | ICD-10-CM | POA: Diagnosis not present

## 2019-12-06 DIAGNOSIS — F3181 Bipolar II disorder: Secondary | ICD-10-CM

## 2019-12-06 MED ORDER — BUPROPION HCL ER (XL) 300 MG PO TB24: 300.0000 mg | ORAL_TABLET | Freq: Every day | ORAL | 1 refills | Status: DC

## 2019-12-06 MED ORDER — DULOXETINE HCL 60 MG PO CPEP: 60.0000 mg | ORAL_CAPSULE | Freq: Every day | ORAL | 1 refills | Status: DC

## 2019-12-11 ENCOUNTER — Other Ambulatory Visit (HOSPITAL_COMMUNITY)
Admission: RE | Admit: 2019-12-11 | Discharge: 2019-12-11 | Disposition: A | Discharge status: Home / Self Care | Payer: BC Managed Care – PPO | Source: Ambulatory Visit | Attending: Pulmonary Disease | Admitting: Pulmonary Disease

## 2019-12-12 ENCOUNTER — Telehealth (HOSPITAL_COMMUNITY): Payer: Self-pay | Admitting: Psychiatry

## 2019-12-12 NOTE — Telephone Encounter (Signed)
Could you advise her to contact either of the therapist below, who is specialized in [place] (they are recommended by one of our therapists here). Although I believe they practice in [place],  hopefully they provide virtual visit.   [place] , [place] Christianne Dolin, [place]

## 2019-12-13 ENCOUNTER — Encounter (HOSPITAL_COMMUNITY): Payer: Self-pay | Admitting: *Deleted

## 2019-12-18 ENCOUNTER — Other Ambulatory Visit: Payer: Self-pay

## 2019-12-18 ENCOUNTER — Ambulatory Visit: Payer: BC Managed Care – PPO | Attending: Pulmonary Disease | Admitting: Pulmonary Disease

## 2019-12-18 DIAGNOSIS — G4733 Obstructive sleep apnea (adult) (pediatric): Secondary | ICD-10-CM | POA: Insufficient documentation

## 2019-12-18 DIAGNOSIS — Z79899 Other long term (current) drug therapy: Secondary | ICD-10-CM | POA: Diagnosis not present

## 2019-12-18 DIAGNOSIS — Z7982 Long term (current) use of aspirin: Secondary | ICD-10-CM | POA: Diagnosis not present

## 2019-12-20 NOTE — Telephone Encounter (Signed)
Spoke with patient and she stated she received the message about the counseling to call but she found one of her own and it is a christian one and do not know the names right now.

## 2019-12-21 ENCOUNTER — Telehealth: Payer: Self-pay | Admitting: Pulmonary Disease

## 2019-12-21 DIAGNOSIS — G4733 Obstructive sleep apnea (adult) (pediatric): Secondary | ICD-10-CM

## 2019-12-21 NOTE — Telephone Encounter (Signed)
Called and spoke with patient about CPAP recommendations from Dr Elsworth Soho. Patient expressed understanding. Order placed for CPAP. Nothing further needed at this time.

## 2019-12-21 NOTE — Procedures (Signed)
Patient Name: Jasmine, [name] Date: 12/18/2019 Gender: Female D.O.B: 1971-06-04 Age (years): 45 Referring Provider: Kara Mead MD, ABSM Height (inches): 69 Interpreting Physician: Kara Mead MD, ABSM Weight (lbs): 214 RPSGT: Jasmine [name] BMI: 32 MRN: [id number] Neck Size: 16.50 <br> <br> CLINICAL INFORMATION The patient is referred for a CPAP titration to treat sleep apnea.    Date of HST: 11/2019 showed severe OSA - AHI 37 events/ hr   SLEEP STUDY TECHNIQUE As per the AASM Manual for the Scoring of Sleep and Associated Events v2.3 (April 2016) with a hypopnea requiring 4% desaturations.  The channels recorded and monitored were frontal, central and occipital EEG, electrooculogram (EOG), submentalis EMG (chin), nasal and oral airflow, thoracic and abdominal wall motion, anterior tibialis EMG, snore microphone, electrocardiogram, and pulse oximetry. Continuous positive airway pressure (CPAP) was initiated at the beginning of the study and titrated to treat sleep-disordered breathing.  MEDICATIONS Medications self-administered by patient taken the night of the study : PRAVASTATIN, COREG, BUPROPION, ATIVAN, LAMICTAL, ACV, ASPIRIN, GABAPENTIN, OMEPRAZOLE, COQ-10, MELATONIN, OMEGA-3, TUMERIC CURCUMIN, FIBER TAB, BIOTIN  TECHNICIAN COMMENTS Comments added by technician: Patient tolerated CPAP therapy very well. Therapy started at 4 cm of H2O and increased to 15 cm of H2O due to events and snoring. Therapy started w/o oxygen supplement, which continued throughout study. Optimal pressure obtained due to REM-supine sleep observed. Paitent did have some difficulties with keeping mouth closed during therapy, causing significant leaks. Patient awaken startled and pulled mask off only once. Patient stated her surgar level was low and began eating crackers. Comments added by scorer: N/A   RESPIRATORY PARAMETERS Optimal PAP Pressure (cm): 15 AHI at Optimal Pressure (/hr): 3.6 Overall  Minimal O2 (%): 68.0 Supine % at Optimal Pressure (%): 32 Minimal O2 at Optimal Pressure (%): 80.0   SLEEP ARCHITECTURE The study was initiated at 9:53:18 PM and ended at 5:12:04 AM.  Sleep onset time was 4.3 minutes and the sleep efficiency was 87.3%%. The total sleep time was 383 minutes.  The patient spent 3.0%% of the night in stage N1 sleep, 64.9%% in stage N2 sleep, 24.3%% in stage N3 and 7.8% in REM.Stage REM latency was 257.0 minutes  Wake after sleep onset was 51.5. Alpha intrusion was absent. Supine sleep was 7.83%.  CARDIAC DATA The 2 lead EKG demonstrated sinus rhythm. The mean heart rate was 69.7 beats per minute. Other EKG findings include: PVCs.   LEG MOVEMENT DATA The total Periodic Limb Movements of Sleep (PLMS) were 13. The PLMS index was 2.0. A PLMS index of <15 is considered normal in adults.  IMPRESSIONS - The optimal PAP pressure was 15 cm of water. - Central sleep apnea was not noted during this titration (CAI = 0.3/h). - Severe oxygen desaturations were observed during this titration (min O2 = 68.0%). - The patient snored with moderate snoring volume during this titration study. - 2-lead EKG demonstrated: PVCs - Clinically significant periodic limb movements were not noted during this study. LMs were frequent but Arousals associated with LMs were rare.   DIAGNOSIS - Obstructive Sleep Apnea (G47.33)   RECOMMENDATIONS - Trial of CPAP therapy on 15 cm H2O with a Small size Resmed Full Face Mask AirFit F30 mask and heated humidification. - Avoid alcohol, sedatives and other CNS depressants that may worsen sleep apnea and disrupt normal sleep architecture. - Sleep hygiene should be reviewed to assess factors that may improve sleep quality. - Weight management and regular exercise should be initiated or continued. - Return to Sleep  Center for re-evaluation after 4 weeks of therapy   Kara Mead MD Board Certified in [place]

## 2019-12-21 NOTE — Telephone Encounter (Signed)
Based on titration study, please send RX for  Auto CPAP 10- 15 cm H2O with a Small size Resmed Full Face Mask AirFit F30 mask and heated humidification. OV in 6 wks with APP/ me

## 2019-12-26 ENCOUNTER — Ambulatory Visit: Payer: BC Managed Care – PPO | Admitting: Physical Therapy

## 2019-12-27 ENCOUNTER — Other Ambulatory Visit: Payer: Self-pay

## 2019-12-27 ENCOUNTER — Emergency Department (HOSPITAL_COMMUNITY)
Admission: EM | Admit: 2019-12-27 | Discharge: 2019-12-27 | Discharge status: Against Medical Advice | Payer: BC Managed Care – PPO

## 2019-12-31 ENCOUNTER — Ambulatory Visit: Payer: BC Managed Care – PPO | Admitting: Physical Therapy
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
## 2020-01-10 ENCOUNTER — Telehealth (INDEPENDENT_AMBULATORY_CARE_PROVIDER_SITE_OTHER): Payer: BC Managed Care – PPO | Admitting: Psychiatry

## 2020-01-10 ENCOUNTER — Other Ambulatory Visit: Payer: Self-pay

## 2020-01-10 ENCOUNTER — Encounter: Payer: Self-pay | Admitting: Pulmonary Disease

## 2020-01-10 ENCOUNTER — Ambulatory Visit (INDEPENDENT_AMBULATORY_CARE_PROVIDER_SITE_OTHER): Payer: BC Managed Care – PPO | Admitting: Pulmonary Disease

## 2020-01-10 ENCOUNTER — Encounter (HOSPITAL_COMMUNITY): Payer: Self-pay | Admitting: Psychiatry

## 2020-01-10 DIAGNOSIS — G4733 Obstructive sleep apnea (adult) (pediatric): Secondary | ICD-10-CM | POA: Diagnosis not present

## 2020-01-10 DIAGNOSIS — F419 Anxiety disorder, unspecified: Secondary | ICD-10-CM | POA: Diagnosis not present

## 2020-01-10 DIAGNOSIS — J9611 Chronic respiratory failure with hypoxia: Secondary | ICD-10-CM

## 2020-01-10 DIAGNOSIS — F3181 Bipolar II disorder: Secondary | ICD-10-CM

## 2020-01-10 MED ORDER — RISPERIDONE 0.5 MG PO TABS: 0.5000 mg | ORAL_TABLET | Freq: Every day | ORAL | 1 refills | Status: DC

## 2020-01-10 NOTE — Assessment & Plan Note (Signed)
Etiology is unclear, attributing it to pulmonary hypertension and diastolic heart failure Pulmonary hypertension was only mild on previous echo in 2018. We will see if hypoxia improves after CPAP use 6 to 8 weeks.  If does not then may need repeat echo and right heart cath to assess, does not seem to have significant hypercarbia on daytime ABG No evidence of airway obstruction on PFTs

## 2020-01-10 NOTE — Assessment & Plan Note (Signed)
Severe OSA on retesting. Prescription for auto CPAP 10 to 15 cm has been sent to DME and she is awaiting Emphasized compliance Weight loss encouraged, compliance with goal of at least 4-6 hrs every night is the expectation. Advised against medications with sedative side effects Cautioned against driving when sleepy - understanding that sleepiness will vary on a day to day basis

## 2020-01-10 NOTE — Patient Instructions (Signed)
1. Continue lamotrigine 200 mg daily  2.Continue duloxetine 60 mgdaily  3.Continuebupropion300 mg daily 4. Start risperidone 0.5 mg at night  4.Continuelorazepam 0.5mg dailyas needed for anxiety 5Next appointment: 9/9 at 1:20

## 2020-01-10 NOTE — Progress Notes (Signed)
   Subjective:    Patient ID: Jasmine [name], female    DOB: 1970-09-06, 49 y.o.   MRN: [id number]  HPI   49 yo IDDM  for FU of OSA &  hypoxia and shortness of breath.  PMH -IDDM on insulin pump, CKD stage III, multiple lung nodules stable from 20 18-20 20 She reports longstanding history of asthma for which she takes albuterol MDI as needed.  This is worse during the pollen season and she needs albuterol MDI daily  Chief Complaint  Patient presents with  . Follow-up    Patient has not recieved CPAP machine she is supposed to get CPAP machine next week. Wears 3 liters oxygen at night when she sleeps and when she feels like she needs it during the day. Patient has been having a lot of acid reflux at night it has been waking her up and she feels sick to her stomach. States that when she is laying down her oxygen is in the upper 80s to lower 90s    She has not received her oxygen concentrator.  Oxygen level still drops when she lies down and she uses 2 to 3 L of oxygen.  She only uses this on severe activity when she goes outside in the daytime.  She has not received POC and is annoyed with her DME. Home sleep study results were discussed, she is awaiting CPAP. Compliant with Lasix, reports 1+ pedal edema  We also reviewed prior CT and PFTs. Oxygen saturation 93% today, she was noted to drop to 88 percent on last office visit  Significant tests/ events reviewed Ambulation 10/05/19 OV >> dropped to 88%  04/2019 ABG 7.42/43/ 75    CT chest 04/2019 no emphysema, stable nodules  PFTs 04/2019-no airway obstruction, ratio 87, FEV1 79%, FVC 72%, TLC 82%, DLCO 76%  Echo 04/2017 normal LVEF, RVSP 38   CPAP titration >> 15 cm, Sm FF mask HST showed severe OSA - worse than before - avg 37 events/ hr with drop in O2 levels HST2/2018 -moderate OSA with AHI 25/hour especially worse when supine with AHI 35/hour and lowest desaturation of 65%  Review of Systems     Objective:   Physical  Exam        Assessment & Plan:

## 2020-01-10 NOTE — Patient Instructions (Signed)
  Low oxygen level may be related to pulmonary hypertension and fluid buildup  Stay on the Lasix. Let us reassess your oxygen levels after using CPAP for 4 to 6 weeks and decide if you need a portable oxygen concentrator

## 2020-01-15 ENCOUNTER — Telehealth: Payer: Self-pay | Admitting: Pulmonary Disease

## 2020-01-15 ENCOUNTER — Ambulatory Visit: Payer: BC Managed Care – PPO | Admitting: Physical Therapy

## 2020-01-15 NOTE — Telephone Encounter (Signed)
Msg sent to [facility] 

## 2020-01-15 NOTE — Telephone Encounter (Signed)
I received this from [employer]  Waymon Budge, [place]; Karoline Caldwell,   I have reached out to the manager about this. I will let you know once he gets back to me.   Thanks!

## 2020-01-17 ENCOUNTER — Ambulatory Visit: Payer: BC Managed Care – PPO | Admitting: Physical Therapy
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
## 2020-02-14 ENCOUNTER — Telehealth (HOSPITAL_COMMUNITY): Payer: BC Managed Care – PPO | Admitting: Psychiatry

## 2020-02-14 ENCOUNTER — Other Ambulatory Visit: Payer: Self-pay

## 2020-02-15 ENCOUNTER — Ambulatory Visit: Payer: BC Managed Care – PPO | Admitting: Physical Therapy

## 2020-02-25 ENCOUNTER — Other Ambulatory Visit (HOSPITAL_COMMUNITY): Payer: Self-pay | Admitting: Psychiatry

## 2020-02-25 DIAGNOSIS — F3181 Bipolar II disorder: Secondary | ICD-10-CM

## 2020-02-26 ENCOUNTER — Other Ambulatory Visit (HOSPITAL_COMMUNITY): Payer: Self-pay | Admitting: Psychiatry

## 2020-02-26 ENCOUNTER — Telehealth: Payer: Self-pay | Admitting: Pulmonary Disease

## 2020-02-26 DIAGNOSIS — F3181 Bipolar II disorder: Secondary | ICD-10-CM

## 2020-02-26 MED ORDER — LAMOTRIGINE 200 MG PO TABS: 200.0000 mg | ORAL_TABLET | Freq: Every day | ORAL | 1 refills | Status: DC

## 2020-02-26 MED ORDER — LORAZEPAM 0.5 MG PO TABS: 0.5000 mg | ORAL_TABLET | Freq: Three times a day (TID) | ORAL | 1 refills | Status: DC

## 2020-02-26 MED ORDER — DULOXETINE HCL 60 MG PO CPEP: 60.0000 mg | ORAL_CAPSULE | Freq: Every day | ORAL | 1 refills | Status: DC

## 2020-02-27 ENCOUNTER — Ambulatory Visit: Payer: BC Managed Care – PPO | Admitting: Physical Therapy

## 2020-02-27 NOTE — Telephone Encounter (Signed)
I have called Melissa to check on this

## 2020-02-27 NOTE — Telephone Encounter (Signed)
I spoke to [facility] they are going to call the patient and get this set up

## 2020-02-27 NOTE — Telephone Encounter (Signed)
Routing to [facility] per protocol order placed in April 2021 for POC
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
## 2020-03-11 ENCOUNTER — Telehealth (INDEPENDENT_AMBULATORY_CARE_PROVIDER_SITE_OTHER): Payer: BC Managed Care – PPO | Admitting: Psychiatry

## 2020-03-11 ENCOUNTER — Encounter (HOSPITAL_COMMUNITY): Payer: Self-pay | Admitting: Psychiatry

## 2020-03-11 ENCOUNTER — Ambulatory Visit: Payer: BC Managed Care – PPO | Attending: Chiropractic Medicine | Admitting: Physical Therapy

## 2020-03-11 ENCOUNTER — Other Ambulatory Visit: Payer: Self-pay

## 2020-03-11 DIAGNOSIS — F3181 Bipolar II disorder: Secondary | ICD-10-CM | POA: Diagnosis not present

## 2020-03-11 DIAGNOSIS — F419 Anxiety disorder, unspecified: Secondary | ICD-10-CM

## 2020-03-11 MED ORDER — RISPERIDONE 0.5 MG PO TABS: 0.5000 mg | ORAL_TABLET | Freq: Every day | ORAL | 1 refills | Status: DC

## 2020-03-11 MED ORDER — BUPROPION HCL ER (XL) 300 MG PO TB24: 300.0000 mg | ORAL_TABLET | Freq: Every day | ORAL | 1 refills | Status: DC

## 2020-03-11 NOTE — Patient Instructions (Addendum)
1. Continue lamotrigine 200 mg daily  2.Continue duloxetine 60 mgdaily  3.Continuebupropion300 mg daily  4. Continue risperidone 0.5 mg at night  5. Hold lorazepam given she is on opioid 5Next appointment: 11/23 at 2:50  CONTACT INFORMATION  What to do if you need to get in touch with someone regarding a psychiatric issue:  1. EMERGENCY: For psychiatric emergencies (if you are suicidal or if there are any other safety issues) call 911 and/or go to your nearest Emergency Room immediately.   2. IF YOU NEED SOMEONE TO TALK TO RIGHT NOW: Given my clinical responsibilities, I may not be able to speak with you over the phone for a prolonged period of time.  A. You may always call The National Suicide Prevention Lifeline at 1-800-273-TALK 2232725523).  B. You may walk in to [facility]  Address: [address] [place], [place] [address], Phone: [phone].  Open 24/7, No appointment required. [place] of residence will also have local crisis services. For [facility]: [place] at [phone] ([place])

## 2020-03-24 ENCOUNTER — Ambulatory Visit (INDEPENDENT_AMBULATORY_CARE_PROVIDER_SITE_OTHER): Payer: BC Managed Care – PPO

## 2020-03-24 ENCOUNTER — Ambulatory Visit (INDEPENDENT_AMBULATORY_CARE_PROVIDER_SITE_OTHER): Payer: BC Managed Care – PPO | Admitting: Primary Care

## 2020-03-24 ENCOUNTER — Encounter: Payer: Self-pay | Admitting: Primary Care

## 2020-03-24 ENCOUNTER — Other Ambulatory Visit: Payer: Self-pay

## 2020-03-24 VITALS — BP 128/80 | HR 72 | Temp 97.4°F | Ht 69.5 in | Wt 325.8 lb

## 2020-03-24 DIAGNOSIS — I5030 Unspecified diastolic (congestive) heart failure: Secondary | ICD-10-CM | POA: Insufficient documentation

## 2020-03-24 DIAGNOSIS — N184 Chronic kidney disease, stage 4 (severe): Secondary | ICD-10-CM | POA: Diagnosis not present

## 2020-03-24 DIAGNOSIS — I5032 Chronic diastolic (congestive) heart failure: Secondary | ICD-10-CM

## 2020-03-24 DIAGNOSIS — G4733 Obstructive sleep apnea (adult) (pediatric): Secondary | ICD-10-CM | POA: Diagnosis not present

## 2020-03-24 DIAGNOSIS — J9611 Chronic respiratory failure with hypoxia: Secondary | ICD-10-CM

## 2020-03-24 NOTE — Assessment & Plan Note (Addendum)
-   Lasix discontinued during hospitalization d/t kidney failure - Checking BMET and BNP today - Following with nephrology, has an apt in 1-2 weeks

## 2020-03-24 NOTE — Assessment & Plan Note (Addendum)
-   Patient is 83% compliant with CPAP use  - Pressure 10-15cm h20; residual AHI 2.5 - Continue to wear CPAP with 3L oxygen every night for 4-6 hours or more

## 2020-03-24 NOTE — Progress Notes (Signed)
@Patient  ID: Jasmine [name], female    DOB: 1971/04/12, 49 y.o.   MRN: [id number]  Chief Complaint  Patient presents with  . Hospitalization Follow-up    Pt states she has been doing okay since she has been out of the hospital, states that she still feels week. Pt is on CPAP with 3L O2 bled into machine and states she wears it most nights. DME: [employer].    Referring provider: Aletha Halim., PA-C  HPI: 49 year old female, former smoker quit in 2013 (15 pack year hx). PMH significant for OSA, pulmonary infiltrates, stable pulmonary nodules, chronic respiratory failure with hypoxia, HTN, type 1 diabetes, hypothyroidism, CKD stage 5, bipolar 2 disorder. Patient of Dr. Elsworth Soho, last seen on 01/10/20. She has severe OSA, plan start auto CPAP 10-15cm h20. Etiology of respiratory failure is attributed to pulmonary hypertension and diastolic heart failure. Plan to see if hypoxia improves after using CPAP for 6-8 weeks. If it does not may need repeat echo and right heart cath to assess. No evidence of airway obstruction on PFTs and she does not have significant hypercarbia on daytime ABGs.   Hospital course 03/16/20-03/20/20: Admitted to [facility] health via EMS. She was optundant on arrival. She has not felt well for 2 days. She was prescribe prednisone for RA which is a new diagnosed.    03/24/2020- Interim hx Patient presents today for hospital follow-up. She is feeling better, has some residual weakness/fatigue. She has a cough with slight production. Mucus is mostly clear. Some wheezing. She uses Albuterol inhaler 4-5 times a week esp during allergy season. No shortness of breath. She uses flonase nasal spray. She is not currently on prednisone. Lasix was discontinued after she was hospitalized, she has taken it once or LE/ankle edema. She has an apt with PCP and nephrology in 1-2 weeks. She has been compliant with CPAP, confirmed with [employer]. She continues to wear 3L oxygen at night blended in  with her CPAP. She was originally put on oxygen 24/7. She has not been wearing oxygen during the day, her O2 level typically runs around 92% RA with lowest she has seen being 89% She received CPAP machine 1 month ago. She uses a nasal pillow mask. She is not having difficulty with mask and denies significant air leakage. DME company is [employer]. She put her mask on every night but does not always gets 4 hours. She had sinus infection in September so during this time she was unable to wear her mask. She also did not wear CPAP a week in October when she was hospitalized for renal failure.    Airview download 02/23/20-03/23/20: Usage 25/30 days used (83%); 24 days (80%) > 4 hours Average usagage 5 huors 25 mins Pressure 10-15cm h20 Airleaks 20.7L/min AHI 2.5    Significant tests/ events reviewed Ambulation 10/05/19 OV >> dropped to 88%  04/2019 ABG 7.42/43/ 75  CT chest 04/2019 no emphysema, stable nodules  PFTs 04/2019-no airway obstruction, ratio 87, FEV1 79%, FVC 72%, TLC 82%, DLCO 76%  Echo 04/2017 normal LVEF, RVSP 38 Echo 03/20/20 [facility] health)- EF 65-70%, LA mild-moderately dilated. Grade 1 DD. Unable to assess PA pressure    CPAP titration >> 15 cm, Sm FF mask HST showed severe OSA - worse than before - avg 37 events/ hr with drop in O2 levels HST2/2018 -moderate OSA with AHI 25/hour especially worse when supine with AHI 35/hour and lowest desaturation of 65%  Allergies  Allergen Reactions  . Ciprofloxacin Swelling  and Other (See Comments)    Per pt caused lips swell and nauseous feeling  . Levaquin [Levofloxacin] Swelling and Other (See Comments)    Per pt caused lips swell and nauseous feeling  . Buspar [Buspirone] Other (See Comments)    abd cramping  . Linaclotide Other (See Comments)  . Advair Diskus [Fluticasone-Salmeterol] Other (See Comments)    Thrush   . Biaxin [Clarithromycin] Rash  . Hydroxyzine Palpitations    Immunization History  Administered Date(s)  Administered  . Influenza Split 08/07/2010, 04/07/2012, 04/07/2013  . Influenza,inj,Quad PF,6+ Mos 03/03/2009, 04/07/2012, 04/07/2013, 03/10/2016, 03/02/2017, 02/23/2018, 03/01/2019, 03/03/2020  . Influenza,inj,quad, With Preservative 03/05/2019  . Influenza-Unspecified 08/07/2010, 03/10/2016, 03/10/2017  . Pneumococcal Polysaccharide-23 08/07/2010, 08/07/2010, 04/08/2011, 03/02/2017  . Pneumococcal-Unspecified 04/08/2011, 03/10/2017  . Tdap 04/21/2016    Past Medical History:  Diagnosis Date  . Anxiety   . Arthritis   . Asthma   . Balance problems   . Bipolar disorder ([place])   . Charcot ankle   . Chronic fatigue   . Chronic kidney disease    STAGE 3-4  . Depression   . Diabetes mellitus   . DKA, type 1 ([place]) 11/04/2011  . Elevated cholesterol   . Fibromyalgia   . GERD (gastroesophageal reflux disease)   . Headache   . History of suicidal ideation   . Hyperlipemia   . Hypertension   . Hypothyroidism   . IBS (irritable bowel syndrome)   . Memory changes   . Obesity   . Sleep apnea    HAS C -PAP / DOES NOT USE  . Stress incontinence    Pt had surgery to correct this.  . Tachycardia   . Tobacco abuse   . Tremor   . UTI (lower urinary tract infection)     Tobacco History: Social History   Tobacco Use  Smoking Status Former Smoker  . Packs/day: 0.75  . Years: 20.00  . Pack years: 15.00  . Types: Cigarettes  . Quit date: 06/08/2011  . Years since quitting: 8.8  Smokeless Tobacco Never Used   Counseling given: Not Answered   Outpatient Medications Prior to Visit  Medication Sig Dispense Refill  . APPLE CIDER VINEGAR PO Take 1 tablet by mouth daily.    Marland Kitchen aspirin EC 81 MG tablet Take 81 mg by mouth at bedtime.     Marland Kitchen buPROPion (WELLBUTRIN XL) 300 MG 24 hr tablet Take 1 tablet (300 mg total) by mouth daily. 30 tablet 1  . carvedilol (COREG) 25 MG tablet Take 25 mg by mouth 2 (two) times daily with a meal.    . Cholecalciferol (VITAMIN D3 SUPER STRENGTH) 50 MCG  (2000 UT) TABS Take 1 tablet by mouth daily.     . Coenzyme Q10 (COQ-10 PO) Take 1 capsule by mouth daily.    . Continuous Blood Gluc Sensor MISC 1 each by Does not apply route as directed. Use as directed every 14 days. May dispense FreeStyle [employer] or similar.    . dicyclomine (BENTYL) 10 MG capsule Take 10 mg by mouth 3 (three) times daily as needed for spasms.    . DULoxetine (CYMBALTA) 60 MG capsule Take 1 capsule (60 mg total) by mouth daily. 30 capsule 1  . Erenumab-aooe (AIMOVIG) 70 MG/ML SOAJ Inject 70 mg into the skin every 30 (thirty) days. 1 pen 11  . fluticasone (FLONASE) 50 MCG/ACT nasal spray Place 2 sprays into the nose daily as needed for allergies.     . folic  acid (FOLVITE) 400 MCG tablet Take 400 mcg by mouth every evening.     . furosemide (LASIX) 20 MG tablet Take 20 mg by mouth daily.     Marland Kitchen gabapentin (NEURONTIN) 100 MG capsule Take 300 mg by mouth at bedtime.    Marland Kitchen HYDROcodone-acetaminophen (NORCO/VICODIN) 5-325 MG tablet Take one tab po q 4 hrs prn pain (Patient taking differently: Take 0.5 tablets by mouth every 4 (four) hours as needed for moderate pain. ) 6 tablet 0  . insulin glargine (LANTUS) 100 UNIT/ML injection Inject 0.5 mLs (50 Units total) into the skin daily. (Patient taking differently: Inject 50 Units into the skin daily as needed (when pump is not working). ) 10 mL 11  . lamoTRIgine (LAMICTAL) 200 MG tablet Take 1 tablet (200 mg total) by mouth daily. 30 tablet 1  . levothyroxine (SYNTHROID, LEVOTHROID) 150 MCG tablet Take 150 mcg by mouth daily before breakfast.    . LORazepam (ATIVAN) 0.5 MG tablet Take 1 tablet (0.5 mg total) by mouth 3 (three) times daily. 30 tablet 1  . lubiprostone (AMITIZA) 8 MCG capsule Take 8 mcg by mouth daily.     Marland Kitchen NOVOLOG 100 UNIT/ML injection Inject 2.425-2.95 Units into the skin continuous. Via pump 7pm to midnight 2.950 8:30am -7pm2.925 Midnight - 8:30 2.425  2  . Omega-3 Fatty Acids (FISH OIL PO) Take 1 capsule  by mouth daily.    Marland Kitchen omeprazole (PRILOSEC) 20 MG capsule Take 20 mg by mouth at bedtime.     . ondansetron (ZOFRAN ODT) 4 MG disintegrating tablet Take 1 tablet (4 mg total) by mouth every 8 (eight) hours as needed. (Patient taking differently: Take 4 mg by mouth every 8 (eight) hours as needed for nausea or vomiting. ) 20 tablet 6  . OXYGEN Inhale 3 L into the lungs as needed.    . pravastatin (PRAVACHOL) 80 MG tablet Take 80 mg by mouth at bedtime.    Marland Kitchen PROAIR HFA 108 (90 Base) MCG/ACT inhaler Inhale 1-2 puffs into the lungs every 6 (six) hours as needed for wheezing or shortness of breath.   1  . psyllium (REGULOID) 0.52 g capsule Take 0.52 g by mouth in the morning and at bedtime.     . risperiDONE (RISPERDAL) 0.5 MG tablet Take 1 tablet (0.5 mg total) by mouth at bedtime. 30 tablet 1  . rizatriptan (MAXALT) 5 MG tablet Take 5 mg by mouth as needed for migraine. May repeat in 2 hours if needed    . traMADol (ULTRAM) 50 MG tablet Take 50 mg by mouth as needed.     . TURMERIC PO Take 1 tablet by mouth daily.     No facility-administered medications prior to visit.   Review of Systems  Review of Systems  Constitutional: Positive for fatigue.  HENT: Negative.   Respiratory: Positive for cough and wheezing. Negative for shortness of breath.   Cardiovascular: Positive for leg swelling.  Neurological: Positive for weakness.   Physical Exam  BP 128/80 (BP Location: Left Arm, Cuff Size: Large)   Pulse 72   Temp (!) 97.4 F (36.3 C) (Other (Comment)) Comment (Src): wrist  Ht 5' 9.5" (1.765 m)   Wt (!) 325 lb 12.8 oz (147.8 kg)   LMP 03/23/2017 (Approximate)   SpO2 98%   BMI 47.42 kg/m  Physical Exam Constitutional:      General: She is not in acute distress.    Appearance: Normal appearance. She is obese. She is not ill-appearing.  HENT:  Mouth/Throat:     Comments: Deferred d/t masking Cardiovascular:     Rate and Rhythm: Normal rate and regular rhythm.     Comments: +1-2  BLE edema Pulmonary:     Effort: Pulmonary effort is normal. No respiratory distress.     Breath sounds: No stridor. No wheezing, rhonchi or rales.     Comments: O2 98% RA Neurological:     General: No focal deficit present.     Mental Status: She is alert and oriented to person, place, and time. Mental status is at baseline.  Psychiatric:        Mood and Affect: Mood normal.        Behavior: Behavior normal.        Thought Content: Thought content normal.        Judgment: Judgment normal.      Lab Results:  CBC    Component Value Date/Time   WBC 9.4 09/12/2019 0413   RBC 4.31 09/12/2019 0413   HGB 12.1 09/12/2019 0413   HCT 39.6 09/12/2019 0413   PLT 255 09/12/2019 0413   MCV 91.9 09/12/2019 0413   MCH 28.1 09/12/2019 0413   MCHC 30.6 09/12/2019 0413   RDW 14.1 09/12/2019 0413   LYMPHSABS 2.5 11/24/2018 2250   MONOABS 0.6 11/24/2018 2250   EOSABS 0.2 11/24/2018 2250   BASOSABS 0.1 11/24/2018 2250    BMET    Component Value Date/Time   NA 138 03/24/2020 1616   K 4.7 03/24/2020 1616   CL 103 03/24/2020 1616   CO2 29 03/24/2020 1616   GLUCOSE 53 (L) 03/24/2020 1616   BUN 29 (H) 03/24/2020 1616   CREATININE 1.98 (H) 03/24/2020 1616   CALCIUM 8.6 03/24/2020 1616   GFRNONAA 27 (L) 09/12/2019 0413   GFRAA 31 (L) 09/12/2019 0413    BNP No results found for: BNP  ProBNP    Component Value Date/Time   PROBNP 168.0 (H) 03/24/2020 1616    Imaging: DG Chest 2 View  Result Date: 03/25/2020 CLINICAL DATA:  Chronic respiratory failure hypoxia, diastolic heart failure EXAM: CHEST - 2 VIEW COMPARISON:  03/19/2020 FINDINGS: Normal heart size, mediastinal contours, and pulmonary vascularity. Lungs clear. No pulmonary infiltrate, pleural effusion or pneumothorax. Eventration of anterior RIGHT diaphragm unchanged. Osseous structures unremarkable. IMPRESSION: No acute abnormalities. Electronically Signed   By: Lavonia Dana M.D.   On: 03/25/2020 08:41     Assessment &  Plan:   OSA (obstructive sleep apnea) - Patient is 83% compliant with CPAP use  - Pressure 10-15cm h20; residual AHI 2.5 - Continue to wear CPAP with 3L oxygen every night for 4-6 hours or more   Chronic respiratory failure with hypoxia (HCC) - Unclear etiology, felt to be related to possible mild pulmonary HTN and OSA. She has been compliant with CPAP for approx 4 weeks, small breaks in therapy d/t recent hospitalization. S - Ambulatory O2 low 92% RA after 1 lap, stopped d/t fatigue - Echo 03/20/20 [facility] health)- EF 65-70%, LA mild-moderately dilated. Grade 1 DD. Unable to deterimine PA pressure  - She will likely need right heart cath to further assess, will discuss with Dr. Elsworth Soho. She has an apt with him in November   Diastolic heart failure ([place]) - Lasix discontinued during hospitalization d/t kidney failure - Checking BMET and BNP today - Following with nephrology, has an apt in 1-2 weeks     Martyn Ehrich, NP 03/25/2020

## 2020-03-24 NOTE — Patient Instructions (Addendum)
  Recommendations: - Continue to hold lasix unless directed otherwise  - Continue to wear CPAP with 3L oxygen every night for 4-6 hours or more (great work, looks good on our end!) - Monitor O2 levels on exertion, if consistently <88-90% recommend resuming 1-2L oxygen during the day  Orders: - CXR and labs (checking fluid level and kidney function)   Follow-up: - Keep apt with PCP and nephrology as scheduled  - Apt scheduled for 04/28/20 at 9:15 am with Dr. Elsworth Soho Linna Hoff)

## 2020-03-24 NOTE — Assessment & Plan Note (Addendum)
-   Unclear etiology, felt to be related to possible mild pulmonary HTN and OSA. She has been compliant with CPAP for approx 4 weeks, small breaks in therapy d/t recent hospitalization. S - Ambulatory O2 low 92% RA after 1 lap, stopped d/t fatigue - Echo 03/20/20 [facility] health)- EF 65-70%, LA mild-moderately dilated. Grade 1 DD. Unable to deterimine PA pressure  - She will likely need right heart cath to further assess, will discuss with Dr. Elsworth Soho. She has an apt with him in November

## 2020-03-25 ENCOUNTER — Encounter: Payer: Self-pay | Admitting: Primary Care

## 2020-03-25 LAB — BASIC METABOLIC PANEL
BUN: 29 mg/dL — ABNORMAL HIGH (ref 6–23)
CO2: 29 mEq/L (ref 19–32)
Calcium: 8.6 mg/dL (ref 8.4–10.5)
Chloride: 103 mEq/L (ref 96–112)
Creatinine, Ser: 1.98 mg/dL — ABNORMAL HIGH (ref 0.40–1.20)
GFR: 29.09 mL/min — ABNORMAL LOW (ref >60.00)
Glucose, Bld: 53 mg/dL — ABNORMAL LOW (ref 70–99)
Potassium: 4.7 mEq/L (ref 3.5–5.1)
Sodium: 138 mEq/L (ref 135–145)

## 2020-03-25 LAB — BRAIN NATRIURETIC PEPTIDE: Pro B Natriuretic peptide (BNP): 168 pg/mL — ABNORMAL HIGH (ref 0.0–100.0)

## 2020-03-25 NOTE — Progress Notes (Signed)
Please let patient know her BNP was just slightly elevated, kidney function has improved some but would still recommend holding lasix until she follows up with PCP. Sent My chart message

## 2020-03-25 NOTE — Progress Notes (Signed)
Please let patient know her CXR looked unremarkable. No acute abnormalities. Lungs were clear.

## 2020-04-04 ENCOUNTER — Telehealth: Payer: Self-pay

## 2020-04-04 NOTE — Telephone Encounter (Signed)
NOTES ON FILE FROM [facility] [phone], SENT REFERRAL TO SCHEDULING °

## 2020-04-14 ENCOUNTER — Other Ambulatory Visit (HOSPITAL_COMMUNITY): Payer: Self-pay | Admitting: Obstetrics & Gynecology

## 2020-04-14 DIAGNOSIS — Z1231 Encounter for screening mammogram for malignant neoplasm of breast: Secondary | ICD-10-CM

## 2020-04-16 ENCOUNTER — Inpatient Hospital Stay (HOSPITAL_COMMUNITY): Admission: RE | Admit: 2020-04-16 | Payer: BC Managed Care – PPO | Source: Ambulatory Visit

## 2020-04-16 DIAGNOSIS — Z1231 Encounter for screening mammogram for malignant neoplasm of breast: Secondary | ICD-10-CM

## 2020-04-22 ENCOUNTER — Ambulatory Visit: Payer: BC Managed Care – PPO | Attending: Chiropractic Medicine | Admitting: Physical Therapy

## 2020-04-22 ENCOUNTER — Ambulatory Visit (INDEPENDENT_AMBULATORY_CARE_PROVIDER_SITE_OTHER): Payer: BC Managed Care – PPO | Admitting: Cardiology

## 2020-04-22 ENCOUNTER — Encounter: Payer: Self-pay | Admitting: Cardiology

## 2020-04-22 ENCOUNTER — Other Ambulatory Visit: Payer: Self-pay

## 2020-04-22 VITALS — BP 140/82 | HR 68 | Ht 69.0 in | Wt 318.8 lb

## 2020-04-22 DIAGNOSIS — N184 Chronic kidney disease, stage 4 (severe): Secondary | ICD-10-CM

## 2020-04-22 DIAGNOSIS — I4589 Other specified conduction disorders: Secondary | ICD-10-CM

## 2020-04-22 DIAGNOSIS — G8929 Other chronic pain: Secondary | ICD-10-CM

## 2020-04-22 DIAGNOSIS — E1022 Type 1 diabetes mellitus with diabetic chronic kidney disease: Secondary | ICD-10-CM

## 2020-04-22 DIAGNOSIS — I5032 Chronic diastolic (congestive) heart failure: Secondary | ICD-10-CM

## 2020-04-22 DIAGNOSIS — G4730 Sleep apnea, unspecified: Secondary | ICD-10-CM

## 2020-04-22 DIAGNOSIS — J9611 Chronic respiratory failure with hypoxia: Secondary | ICD-10-CM

## 2020-04-22 DIAGNOSIS — I10 Essential (primary) hypertension: Secondary | ICD-10-CM | POA: Diagnosis not present

## 2020-04-22 DIAGNOSIS — M5442 Lumbago with sciatica, left side: Secondary | ICD-10-CM | POA: Diagnosis not present

## 2020-04-22 DIAGNOSIS — Z7189 Other specified counseling: Secondary | ICD-10-CM

## 2020-04-22 NOTE — Therapy (Signed)
[facility] [place], [place], [address] Phone: [phone]   Fax:  [phone]  Physical Therapy Evaluation  Patient Details  Name: Jasmine [name] MRN: [id number] Date of Birth: 03-Sep-1970 Referring Provider (PT): Levy Pupa MD   Encounter Date: 04/22/2020   PT End of Session - 04/22/20 1134    Visit Number 1    Number of Visits 12    Date for PT Re-Evaluation 05/20/20    PT Start Time 1030    PT Stop Time 1113    PT Time Calculation (min) 43 min           Past Medical History:  Diagnosis Date  . Anxiety   . Arthritis   . Asthma   . Balance problems   . Bipolar disorder ([place])   . Charcot ankle   . Chronic fatigue   . Chronic kidney disease    STAGE 3-4  . Depression   . Diabetes mellitus   . DKA, type 1 ([place]) 11/04/2011  . Elevated cholesterol   . Fibromyalgia   . GERD (gastroesophageal reflux disease)   . Headache   . History of suicidal ideation   . Hyperlipemia   . Hypertension   . Hypothyroidism   . IBS (irritable bowel syndrome)   . Memory changes   . Obesity   . Sleep apnea    HAS C -PAP / DOES NOT USE  . Stress incontinence    Pt had surgery to correct this.  . Tachycardia   . Tobacco abuse   . Tremor   . UTI (lower urinary tract infection)     Past Surgical History:  Procedure Laterality Date  . INCONTINENCE SURGERY    . NASAL FRACTURE SURGERY    . ovary removed    . OVARY SURGERY    . PUBOVAGINAL SLING  08/16/2011   Procedure: [name];  Surgeon: Bernestine Amass, MD;  Location: [facility] ORS;  Service: Urology;  Laterality: N/A;         . UTERINE FIBROID SURGERY  2001    There were no vitals filed for this visit.    Subjective Assessment - 04/22/20 1135    Subjective COVID-19 screen performed prior to patient entering clinic.  The patient presents to the clinic today with chronic low back pain wiht radiation down her posterior thigh to her left foot which goes  numb.  She had an injection last week and felt very good for a few days but the pain has returned.  rest decreases her pain but walking and prolonged sitting increases her pain.    Pertinent History Bi-polar, left knee pain, Charcot ankle, depression, Fibromyalgia, HTN.    How long can you sit comfortably? Varies.    Patient Stated Goals Decrease pain.              [facility] PT Assessment - 04/22/20 0001      Assessment   Medical Diagnosis Radiculopathy of lumbar region.    Referring Provider (PT) Levy Pupa MD    Onset Date/Surgical Date --   2 years+     Precautions   Precautions None      Restrictions   Weight Bearing Restrictions No      Balance Screen   Has the patient fallen in the past 6 months Yes    How many times? --   3.   Has the patient had a decrease in activity level because of a fear of falling?  Yes  Is the patient reluctant to leave their home because of a fear of falling?  --   'Somedays".     [place] residence      Prior Function   Level of Independence Independent      Posture/Postural Control   Posture/Postural Control Postural limitations    Postural Limitations Rounded Shoulders;Forward head    Posture Comments Bilateral Genu Valgum.      Deep Tendon Reflexes   DTR Assessment Site Patella;Achilles    Patella DTR 0    Achilles DTR 0      ROM / Strength   AROM / PROM / Strength AROM;Strength   Crepitus of left knee.     AROM   Overall AROM Comments Active lumbar flexion limited by 50% percent and painful and active extension to 20 degrees and less painful.      Strength   Overall Strength Comments LE strength generally normal though left knee pain prohibits patient from giving max effort during [place].      Palpation   Palpation comment Tender to palpation left upper gluteal region.      Special Tests   Other special tests Positive left SLR testing.      Ambulation/Gait   Gait Comments Very  antalgic gait pattern.                      Objective measurements completed on examination: See above findings.       [facility] Adult PT Treatment/Exercise - 04/22/20 0001      Modalities   Modalities Electrical Stimulation;Moist Heat      Moist Heat Therapy   Number Minutes Moist Heat 15 Minutes    Moist Heat Location Lumbar Spine      Electrical Stimulation   Electrical Stimulation Location Left low back/upper gluteal region.    Electrical Stimulation Action Pre-mod.    Electrical Stimulation Parameters 80-150 Hz x 15 minutes.    Electrical Stimulation Goals Tone;Pain                       PT Long Term Goals - 04/22/20 1153      PT LONG TERM GOAL #1   Title patient to be independent with HEP    Time 6    Status New      PT LONG TERM GOAL #2   Title Patient able to stand to do chores such as dishes with 2/10 pain or less.    Time 6    Period Weeks    Status New      PT LONG TERM GOAL #3   Title Patient able to perform ADLS with 7-5/64 pain or less.    Time 6    Period Weeks    Status New      PT LONG TERM GOAL #4   Title Patient to report ability to stand/walk for > 30 minutes with no increase in pain.    Time 6    Period Weeks    Status New      PT LONG TERM GOAL #5   Title Sit 30 minutes with pain not > 3/10.    Time 6    Period Weeks    Status New      PT LONG TERM GOAL #6   Title Eliminate left LE symptoms.    Time 6    Period Weeks    Status New  Plan - 04/22/20 1147    Clinical Impression Statement The patient presents to OPPT with c/o chronic low back pain and radiation of pain into her left posterior thigh and foot numbness.  She has limited lumbar flexion which is painful. Extension is less painful.  Her functional mobiliity is impaired due to pain.  She demonstrates a positive left SLR.  Patient will benefit from skilled physical therapy intervention to address deficits and pain.    Personal  Factors and Comorbidities Comorbidity 1;Comorbidity 2;Comorbidity 3+    Comorbidities Bi-polar, left knee pain, Charcot ankle, depression, Fibromyalgia, HTN.    Examination-Activity Limitations Locomotion Level;Sit;Other    Examination-Participation Restrictions Other    Stability/Clinical Decision Making Evolving/Moderate complexity    Clinical Decision Making Low    Rehab Potential Good    PT Frequency 2x / week    PT Duration 6 weeks    PT Treatment/Interventions ADLs/Self Care Home Management;Cryotherapy;Electrical Stimulation;Ultrasound;Traction;Moist Heat;Therapeutic activities;Therapeutic exercise;Functional mobility training;Manual techniques;Patient/family education;Passive range of motion;Dry needling;Spinal Manipulations    PT Next Visit Plan Modalites and STW/M to left low back/upper glteal/Piriformis region.  Extension-biased exercise.    Consulted and Agree with Plan of Care Patient           Patient will benefit from skilled therapeutic intervention in order to improve the following deficits and impairments:  Abnormal gait, Pain, Decreased activity tolerance, Decreased range of motion, Postural dysfunction  Visit Diagnosis: Chronic left-sided low back pain with left-sided sciatica - Plan: PT plan of care cert/re-cert     Problem List Patient Active Problem List   Diagnosis Date Noted  . Diastolic heart failure ([place]) 03/24/2020  . Chronic respiratory failure with hypoxia ([place]) 10/05/2019  . Depression 10/01/2019  . Memory loss 10/01/2019  . Marital estrangement 06/19/2019  . Peripheral neuropathy 02/15/2019  . Dizziness 09/27/2018  . Polypharmacy 09/27/2018  . Severe recurrent major depression without psychotic features ([place]) 08/30/2018  . Bipolar 2 disorder, major depressive episode ([place]) 07/10/2018  . MDD (major depressive disorder), recurrent, severe, with psychosis ([place]) 07/10/2018  . CKD (chronic kidney disease), stage IV ([place]) 07/01/2018  . Transient  Hypoglycemia 07/01/2018  . Chronic migraine 01/17/2017  . Diabetic peripheral neuropathy ([place]) 01/17/2017  . OSA (obstructive sleep apnea) 07/27/2016  . Wheezing 07/27/2016  . Hyperglycemia 12/25/2012  . Acute on chronic renal failure ([place]) 12/25/2012  . Pulmonary infiltrates 10/02/2012  . Postnasal drip 10/02/2012  . Hyperkalemia 09/25/2012  . Morbid obesity ([place]) 09/24/2012  . HTN (hypertension) 09/24/2012  . Bipolar II disorder ([place]) 09/24/2012  . DKA, type 1 ([place]) 11/04/2011  . Gastroenteritis 11/03/2011  . DM (diabetes mellitus), type 1, uncontrolled ([place]) 11/03/2011  . Hyponatremia 11/03/2011  . Elevated lipase 11/03/2011  . Hypothyroidism 11/03/2011  . Tobacco abuse 11/03/2011  . SUI (stress urinary incontinence, female) 08/16/2011    Mayrani Khamis, [place] MPT 04/22/2020, 11:58 AM  [facility] [place], [place], [address] Phone: [phone]   Fax:  [phone]  Name: Jasmine [name] MRN: [id number] Date of Birth: [birth year]

## 2020-04-22 NOTE — Patient Instructions (Addendum)
Medication Instructions:  Your Physician recommend you continue on your current medication as directed.    *If you need a refill on your cardiac medications before your next appointment, please call your pharmacy*   Lab Work: None   Testing/Procedures: None   Follow-Up: At [facility], you and your health needs are our priority.  As part of our continuing mission to provide you with exceptional heart care, we have created designated Provider Care Teams.  These Care Teams include your primary Cardiologist (physician) and Advanced Practice Providers (APPs -  Physician Assistants and Nurse Practitioners) who all work together to provide you with the care you need, when you need it.  We recommend signing up for the patient portal called "MyChart".  Sign up information is provided on this After Visit Summary.  MyChart is used to connect with patients for Virtual Visits (Telemedicine).  Patients are able to view lab/test results, encounter notes, upcoming appointments, etc.  Non-urgent messages can be sent to your provider as well.   To learn more about what you can do with MyChart, go to [email or web address].    Your next appointment:   1 month(s)  The format for your next appointment:   In Person  Provider:   Buford Dresser, MD   Other Instructions Try to keep a log of heart rates, oxygen numbers, and blood pressures. Take some at rest/when you are feeling normal, some after you have been active, and then also when you feel poorly. We will look over the log at the next visit to see if we can find a pattern. If not, we will discuss possible right heart catheterization.

## 2020-04-22 NOTE — Progress Notes (Signed)
Cardiology Office Note:    Date:  04/22/2020   ID:  Jasmine [name], DOB 04-Aug-1970, MRN [id number]  PCP:  Aletha Halim., PA-C  Cardiologist:  Buford Dresser, MD  Referring MD: Aletha Halim., PA-C   CC: new patient consultation for diastolic dysfunction  History of Present Illness:    Jasmine [name] is a 49 y.o. female with a hx of severe OSA, chronic respiratory failure with hypoxia, hypertension, type I diabetes, hypothyroidism, CKD stage 4, prior tobacco use, rheumatoid arthritis who is seen as a new consult at the request of Jasmine [name], Kristen W., PA-C for the evaluation and management of diastolic dysfunction.  Note reviewed from 03/24/20 with Jasmine Pitter, NP. Noted that chronic hypoxic respiratory attributed to pulmonary hypertension and diastolic heart failure.  On my review, echo 04/11/2017 showed normal LV systolic and diastolic function. Normal RV. Normal IVC, RAP 3 mmHg. PASP 38 mmHg. Had a CPX 08/03/2017, notable for immediate desaturations with exercise to 87%, improved during recovery. Mild to moderate functional limitation, no cardiovascular limitation. There was chronotropic incompetence, with peak heart rate of 81 bpm.  Echo done at [facility] 03/20/20 notes normal LV systolic function, grade 1 diastolic dysfunction, normal RV. Pulm pressures not estimated due to insufficient TR jet. IVC not well visualized. E/A 0.8, E/e' 12.8. TR peak velocity noted at 3 m/s, which could be extrapolated to 36 mmHg. TAPSE normal.  Today: Has anxiety attacks nearly every day, has chest pressure at the time she has these. Lasts 1-2 minutes. No clear aggravating/alleviating factors. Feels short of breath with them.   Has chronic kidney disease, most recent Cr 1.98 but has been as high as 2.67 in recent draws. Follows with Dr. Olivia Mackie at [facility] for her kidneys.   We reviewed her echo and CPX results at length. To me, this does not clearly fit with either diastolic heart  failure or pulmonary hypertension.  Uses CPAP and O2 at night. Doesn't routinely use O2 during the day.   Reviewed reported chronotropic incompetence on CPX. Has a fitness tracker on, not sure of her heart rate pattern. Never seen fast heart rates, not sure of her heart rate with exercise.  Does get LE edema intermittently for the last several years. Doesn't weight herself at home. Was told during recent admission to [facility] that her ankles were swollen due to heart failure. On review of notes, they tried a dose of lasix without significant improvement.   Jasmine [name] had MI, Jasmine [name] had MI, father has enlarged heart.   Past Medical History:  Diagnosis Date  . Anxiety   . Arthritis   . Asthma   . Balance problems   . Bipolar disorder ([place])   . Charcot ankle   . Chronic fatigue   . Chronic kidney disease    STAGE 3-4  . Depression   . Diabetes mellitus   . DKA, type 1 ([place]) 11/04/2011  . Elevated cholesterol   . Fibromyalgia   . GERD (gastroesophageal reflux disease)   . Headache   . History of suicidal ideation   . Hyperlipemia   . Hypertension   . Hypothyroidism   . IBS (irritable bowel syndrome)   . Memory changes   . Obesity   . Sleep apnea    HAS C -PAP / DOES NOT USE  . Stress incontinence    Pt had surgery to correct this.  . Tachycardia   . Tobacco abuse   . Tremor   . UTI (lower  urinary tract infection)     Past Surgical History:  Procedure Laterality Date  . INCONTINENCE SURGERY    . NASAL FRACTURE SURGERY    . ovary removed    . OVARY SURGERY    . PUBOVAGINAL SLING  08/16/2011   Procedure: [name];  Surgeon: Bernestine Amass, MD;  Location: [facility] ORS;  Service: Urology;  Laterality: N/A;         . UTERINE FIBROID SURGERY  2001    Current Medications: Current Outpatient Medications on File Prior to Visit  Medication Sig  . APPLE CIDER VINEGAR PO Take 1 tablet by mouth daily.  Marland Kitchen aspirin EC 81 MG tablet Take 81 mg by mouth at bedtime.   Marland Kitchen buPROPion  (WELLBUTRIN XL) 300 MG 24 hr tablet Take 1 tablet (300 mg total) by mouth daily.  . carvedilol (COREG) 25 MG tablet Take 25 mg by mouth 2 (two) times daily with a meal.  . Cholecalciferol (VITAMIN D3 SUPER STRENGTH) 50 MCG (2000 UT) TABS Take 1 tablet by mouth daily.   . Coenzyme Q10 (COQ-10 PO) Take 1 capsule by mouth daily.  . Continuous Blood Gluc Sensor MISC 1 each by Does not apply route as directed. Use as directed every 14 days. May dispense FreeStyle [employer] or similar.  . DULoxetine (CYMBALTA) 60 MG capsule Take 1 capsule (60 mg total) by mouth daily.  . fluticasone (FLONASE) 50 MCG/ACT nasal spray Place 2 sprays into the nose daily as needed for allergies.   . folic acid (FOLVITE) 563 MCG tablet Take 400 mcg by mouth every evening.   . furosemide (LASIX) 20 MG tablet Take 20 mg by mouth daily.   Marland Kitchen glucagon (GLUCAGEN HYPOKIT) 1 MG SOLR injection GlucaGen HypoKit 1 mg Injection  . HYDROcodone-acetaminophen (NORCO/VICODIN) 5-325 MG tablet Take one tab po q 4 hrs prn pain (Patient taking differently: Take 0.5 tablets by mouth every 4 (four) hours as needed for moderate pain. )  . insulin glargine (LANTUS) 100 UNIT/ML injection Inject 0.5 mLs (50 Units total) into the skin daily. (Patient taking differently: Inject 50 Units into the skin daily as needed (when pump is not working). )  . lamoTRIgine (LAMICTAL) 200 MG tablet Take 1 tablet (200 mg total) by mouth daily.  Marland Kitchen levothyroxine (SYNTHROID, LEVOTHROID) 150 MCG tablet Take 150 mcg by mouth daily before breakfast.  . LORazepam (ATIVAN) 0.5 MG tablet Take 1 tablet (0.5 mg total) by mouth 3 (three) times daily.  Marland Kitchen lubiprostone (AMITIZA) 8 MCG capsule Take 8 mcg by mouth daily.   Marland Kitchen NOVOLOG 100 UNIT/ML injection Inject 2.425-2.95 Units into the skin continuous. Via pump 7pm to midnight 2.950 8:30am -7pm2.925 Midnight - 8:30 2.425  . Omega-3 Fatty Acids (FISH OIL PO) Take 1 capsule by mouth daily.  Marland Kitchen omeprazole (PRILOSEC) 20 MG  capsule Take 20 mg by mouth at bedtime.   . ondansetron (ZOFRAN ODT) 4 MG disintegrating tablet Take 1 tablet (4 mg total) by mouth every 8 (eight) hours as needed. (Patient taking differently: Take 4 mg by mouth every 8 (eight) hours as needed for nausea or vomiting. )  . OXYGEN Inhale 3 L into the lungs as needed.  . pravastatin (PRAVACHOL) 80 MG tablet Take 80 mg by mouth at bedtime.  Marland Kitchen PROAIR HFA 108 (90 Base) MCG/ACT inhaler Inhale 1-2 puffs into the lungs every 6 (six) hours as needed for wheezing or shortness of breath.   . psyllium (REGULOID) 0.52 g capsule Take 0.52 g by mouth  in the morning and at bedtime.   . risperiDONE (RISPERDAL) 0.5 MG tablet Take 1 tablet (0.5 mg total) by mouth at bedtime.  . rizatriptan (MAXALT) 5 MG tablet Take 5 mg by mouth as needed for migraine. May repeat in 2 hours if needed  . baclofen (LIORESAL) 10 MG tablet Take 10 mg by mouth 3 (three) times daily as needed.  . dicyclomine (BENTYL) 10 MG capsule Take 10 mg by mouth 3 (three) times daily as needed for spasms. (Patient not taking: Reported on 04/22/2020)  . gabapentin (NEURONTIN) 300 MG capsule Take 300 mg by mouth 3 (three) times daily.  . simethicone (MYLICON) 80 MG chewable tablet Chew by mouth.   No current facility-administered medications on file prior to visit.     Allergies:   Ciprofloxacin, Levaquin [levofloxacin], Buspar [buspirone], Linaclotide, Advair diskus [fluticasone-salmeterol], Biaxin [clarithromycin], and Hydroxyzine   Social History   Tobacco Use  . Smoking status: Former Smoker    Packs/day: 0.75    Years: 20.00    Pack years: 15.00    Types: Cigarettes    Quit date: 06/08/2011    Years since quitting: 8.8  . Smokeless tobacco: Never Used  Vaping Use  . Vaping Use: Never used  Substance Use Topics  . Alcohol use: No  . Drug use: No    Family History: family history includes Asthma in her mother; Bipolar disorder in her mother; Bladder Cancer in her paternal  grandfather; Cancer in her paternal grandmother; Diabetes in her father; Heart disease in her father; Hyperlipidemia in her father; Hypertension in her father; Lymphoma in her father; Suicidality in her maternal grandfather; Thyroid disease in her brother and father.  ROS:   Please see the history of present illness.  Additional pertinent ROS: Constitutional: Negative for chills, fever, night sweats, unintentional weight loss  HENT: Negative for ear pain and hearing loss.   Eyes: Negative for loss of vision and eye pain.  Respiratory: Negative for cough, sputum, wheezing.   Cardiovascular: See HPI. Gastrointestinal: Negative for abdominal pain, melena, and hematochezia.  Genitourinary: Negative for dysuria and hematuria.  Musculoskeletal: Negative for falls and myalgias.  Skin: Negative for itching and rash.  Neurological: Negative for focal weakness, focal sensory changes and loss of consciousness.  Endo/Heme/Allergies: Does not bruise/bleed easily.     EKGs/Labs/Other Studies Reviewed:    The following studies were reviewed today: CPX 08/21/17 Conclusion: Exercise testing with gas exchange demonstrates mild to moderate functional limitation when compared to matched sedentary norms. Pre-exercise spirometry demonstrates mild restrictive properties which are likely related to her specific distribution of weight. There is no clear cardiovascular limitation or exercise-induced bronchospasm. Patient is most likely limited due to severe chronotropic incompetence and poor saturations with exertion.   Echo 04/11/2017 - Left ventricle: The cavity size was normal. Systolic function was  normal. The estimated ejection fraction was in the range of 60%  to 65%. Wall motion was normal; there were no regional wall  motion abnormalities. Left ventricular diastolic function  parameters were normal.  - Pulmonary arteries: PA peak pressure: 38 mm Hg (S).   EKG:  EKG is personally reviewed.  The  ekg ordered today demonstrates NSR at 68 bpm  Recent Labs: 09/12/2019: ALT 15; Hemoglobin 12.1; Platelets 255 10/01/2019: TSH 4.710 03/24/2020: BUN 29; Creatinine, Ser 1.98; Potassium 4.7; Pro B Natriuretic peptide (BNP) 168.0; Sodium 138  Recent Lipid Panel    Component Value Date/Time   CHOL 163 08/31/2018 0632   TRIG 108 08/31/2018 7341  HDL 62 08/31/2018 0632   CHOLHDL 2.6 08/31/2018 0632   VLDL 22 08/31/2018 0632   LDLCALC 79 08/31/2018 0632    Physical Exam:    VS:  BP 140/82   Pulse 68   Ht 5\' 9"  (1.753 m)   Wt (!) 318 lb 12.8 oz (144.6 kg)   LMP 03/23/2017 (Approximate)   BMI 47.08 kg/m     Wt Readings from Last 3 Encounters:  04/22/20 (!) 318 lb 12.8 oz (144.6 kg)  03/24/20 (!) 325 lb 12.8 oz (147.8 kg)  01/10/20 300 lb (136.1 kg)    GEN: Well nourished, well developed in no acute distress HEENT: Normal, moist mucous membranes NECK: No JVD CARDIAC: regular rhythm, normal S1 and S2, no rubs or gallops. No murmurs. VASCULAR: Radial and DP pulses 2+ bilaterally. No carotid bruits RESPIRATORY:  Clear to auscultation without rales, wheezing or rhonchi  ABDOMEN: Soft, non-tender, non-distended MUSCULOSKELETAL:  Ambulates independently SKIN: Warm and dry, no edema NEUROLOGIC:  Alert and oriented x 3. No focal neuro deficits noted. PSYCHIATRIC:  Normal affect    ASSESSMENT:    1. Chronic respiratory failure with hypoxia (HCC)   2. Sleep apnea, unspecified type   3. Chronotropic incompetence   4. Essential hypertension   5. Type 1 diabetes mellitus with stage 4 chronic kidney disease (HCC)   6. Cardiac risk counseling   7. Counseling on health promotion and disease prevention    PLAN:    Chronic respiratory failure with hypoxia, on O2 at night, with severe sleep apnea Morbid obesity, BMI 47 -her chronic respiratory failure has been attributed to both chronic diastolic heart failure and pulmonary hypertension. However, I can find little evidence for these based  on available data -reviewed her prior echo and CPX with her today -we discussed options. She will log symptoms, vitals, etc and we will follow up closely  Chronotropic incompetence -per CPX report -minimally active currently, but her symptoms have not ben exertional. Monitor, but does not appear to be largely contributing factor at this time. -first step would be to cut back carvedilol, as she is currently on 25 mg BID  Hypertension: -slightly above goal today, but reports typically well controlled -for now continue carvedilol 25mg  BID, furosemide 20 mg daily  Type I diabetes, with stage 4 chronic kidney disease -last GFR 29 -continue pravastatin, aspirin for primary prevention -last A1c 7.6 -management per PCP/endocrinology  Cardiac risk counseling and prevention recommendations: -recommend heart healthy/Mediterranean diet, with whole grains, fruits, vegetable, fish, lean meats, nuts, and olive oil. Limit salt. -recommend moderate walking, 3-5 times/week for 30-50 minutes each session. Aim for at least 150 minutes.week. Goal should be pace of 3 miles/hours, or walking 1.5 miles in 30 minutes -recommend avoidance of tobacco products. Avoid excess alcohol. -ASCVD risk score: The 10-year ASCVD risk score Mikey Bussing DC Brooke Bonito., et al., 2013) is: 1.3%   Values used to calculate the score:     Age: 59 years     Sex: Female     Is Non-Hispanic African American: No     Diabetic: Yes     Tobacco smoker: No     Systolic Blood Pressure: 119 mmHg     Is BP treated: Yes     HDL Cholesterol: 94 MG/DL     Total Cholesterol: 176 MG/DL    Plan for follow up: 1 month, she will chart carefully and we will discuss RHC if indicated at that time.  Buford Dresser, MD, PhD [facility]  Medication Adjustments/Labs and Tests Ordered: Current medicines are reviewed at length with the patient today.  Concerns regarding medicines are outlined above.  Orders Placed This Encounter   Procedures  . EKG 12-Lead   No orders of the defined types were placed in this encounter.   Patient Instructions  Medication Instructions:  Your Physician recommend you continue on your current medication as directed.    *If you need a refill on your cardiac medications before your next appointment, please call your pharmacy*   Lab Work: None   Testing/Procedures: None   Follow-Up: At [facility], you and your health needs are our priority.  As part of our continuing mission to provide you with exceptional heart care, we have created designated Provider Care Teams.  These Care Teams include your primary Cardiologist (physician) and Advanced Practice Providers (APPs -  Physician Assistants and Nurse Practitioners) who all work together to provide you with the care you need, when you need it.  We recommend signing up for the patient portal called "MyChart".  Sign up information is provided on this After Visit Summary.  MyChart is used to connect with patients for Virtual Visits (Telemedicine).  Patients are able to view lab/test results, encounter notes, upcoming appointments, etc.  Non-urgent messages can be sent to your provider as well.   To learn more about what you can do with MyChart, go to [email or web address].    Your next appointment:   1 month(s)  The format for your next appointment:   In Person  Provider:   Buford Dresser, MD   Other Instructions Try to keep a log of heart rates, oxygen numbers, and blood pressures. Take some at rest/when you are feeling normal, some after you have been active, and then also when you feel poorly. We will look over the log at the next visit to see if we can find a pattern. If not, we will discuss possible right heart catheterization.   Signed, Buford Dresser, MD PhD 04/22/2020  [place]
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
## 2020-04-24 ENCOUNTER — Ambulatory Visit: Payer: BC Managed Care – PPO | Admitting: *Deleted

## 2020-04-28 ENCOUNTER — Ambulatory Visit: Payer: BC Managed Care – PPO | Admitting: Pulmonary Disease

## 2020-04-29 ENCOUNTER — Other Ambulatory Visit: Payer: Self-pay

## 2020-04-29 ENCOUNTER — Encounter: Payer: Self-pay | Admitting: Psychiatry

## 2020-04-29 ENCOUNTER — Telehealth (HOSPITAL_COMMUNITY): Payer: BC Managed Care – PPO | Admitting: Psychiatry

## 2020-04-29 ENCOUNTER — Telehealth (INDEPENDENT_AMBULATORY_CARE_PROVIDER_SITE_OTHER): Payer: BC Managed Care – PPO | Admitting: Psychiatry

## 2020-04-29 DIAGNOSIS — F3181 Bipolar II disorder: Secondary | ICD-10-CM | POA: Diagnosis not present

## 2020-04-29 DIAGNOSIS — F419 Anxiety disorder, unspecified: Secondary | ICD-10-CM | POA: Diagnosis not present

## 2020-04-29 MED ORDER — RISPERIDONE 0.5 MG PO TABS
0.5000 mg | ORAL_TABLET | Freq: Every day | ORAL | 1 refills | Status: DC
Start: 2020-04-29 — End: 2020-06-10

## 2020-04-29 MED ORDER — DULOXETINE HCL 60 MG PO CPEP: 60.0000 mg | ORAL_CAPSULE | Freq: Every day | ORAL | 1 refills | Status: DC

## 2020-04-29 MED ORDER — BUPROPION HCL ER (XL) 300 MG PO TB24: 300.0000 mg | ORAL_TABLET | Freq: Every day | ORAL | 1 refills | Status: DC

## 2020-04-29 NOTE — Patient Instructions (Signed)
1. Continue lamotrigine 200 mg daily  2.Continue duloxetine 60 mgdaily  3.Continuebupropion300 mg daily  4. Continue risperidone 0.5 mg at night 5. Continue lorazepam 0.5 mg daily as needed for anxiety- one refill left from Dr. Harrington Challenger 6Next appointment:1/4 at 3 PM

## 2020-05-05 ENCOUNTER — Encounter: Payer: BC Managed Care – PPO | Admitting: Physical Therapy

## 2020-05-06 ENCOUNTER — Other Ambulatory Visit (HOSPITAL_COMMUNITY): Payer: Self-pay | Admitting: Psychiatry

## 2020-05-06 DIAGNOSIS — F3181 Bipolar II disorder: Secondary | ICD-10-CM

## 2020-05-07 ENCOUNTER — Telehealth: Payer: Self-pay

## 2020-05-07 ENCOUNTER — Other Ambulatory Visit: Payer: Self-pay | Admitting: Psychiatry

## 2020-05-07 DIAGNOSIS — F3181 Bipolar II disorder: Secondary | ICD-10-CM

## 2020-05-07 MED ORDER — LAMOTRIGINE 200 MG PO TABS: 200.0000 mg | ORAL_TABLET | Freq: Every day | ORAL | 1 refills | Status: DC

## 2020-05-07 NOTE — Telephone Encounter (Signed)
received fax requesting a refill on the lamotrigine 200mg  pt last seen on 04-29-20 and next appt  06-10-20   lamoTRIgine (LAMICTAL) 200 MG tablet Medication Date: 02/26/2020 Department: [facility] Ordering/Authorizing: Cloria Spring, MD  Order Providers  Prescribing Provider Encounter Provider  Cloria Spring, MD Cloria Spring, MD  Outpatient Medication Detail   Disp Refills Start End   lamoTRIgine (LAMICTAL) 200 MG tablet 30 tablet 1 02/26/2020    Sig - Route: Take 1 tablet (200 mg total) by mouth daily. - Oral   Sent to pharmacy as: lamoTRIgine (LAMICTAL) 200 MG tablet   E-Prescribing Status: Receipt confirmed by pharmacy (02/26/2020 1:50 PM EDT)   Associated Diagnoses  Bipolar 2 disorder, major depressive episode ([place])

## 2020-05-19 ENCOUNTER — Other Ambulatory Visit: Payer: Self-pay

## 2020-05-19 ENCOUNTER — Encounter: Payer: Self-pay | Admitting: Cardiology

## 2020-05-19 ENCOUNTER — Ambulatory Visit (INDEPENDENT_AMBULATORY_CARE_PROVIDER_SITE_OTHER): Payer: BC Managed Care – PPO | Admitting: Cardiology

## 2020-05-19 VITALS — BP 136/86 | HR 70 | Ht 69.0 in | Wt 326.0 lb

## 2020-05-19 DIAGNOSIS — G4733 Obstructive sleep apnea (adult) (pediatric): Secondary | ICD-10-CM | POA: Diagnosis not present

## 2020-05-19 DIAGNOSIS — I10 Essential (primary) hypertension: Secondary | ICD-10-CM | POA: Diagnosis not present

## 2020-05-19 DIAGNOSIS — J9611 Chronic respiratory failure with hypoxia: Secondary | ICD-10-CM | POA: Diagnosis not present

## 2020-05-19 DIAGNOSIS — E1022 Type 1 diabetes mellitus with diabetic chronic kidney disease: Secondary | ICD-10-CM

## 2020-05-19 DIAGNOSIS — N184 Chronic kidney disease, stage 4 (severe): Secondary | ICD-10-CM

## 2020-05-19 NOTE — Progress Notes (Signed)
Cardiology Office Note:    Date:  05/19/2020   ID:  Jasmine [name], DOB [birth year], MRN [id number]  PCP:  Aletha Halim., PA-C  Cardiologist:  Buford Dresser, MD  Referring MD: Aletha Halim., PA-C   CC: follow up  History of Present Illness:    Jasmine [name] is a 49 y.o. female with a hx of severe OSA, chronic respiratory failure with hypoxia, hypertension, type I diabetes, hypothyroidism, CKD stage 5, prior tobacco use, rheumatoid arthritis who is seen for follow up today. I initially saw her 04/22/20 as a new consult at the request of Aletha Halim., PA-C for the evaluation and management of diastolic dysfunction.  Note reviewed from 03/24/20 with Jasmine Pitter, NP. Noted that chronic hypoxic respiratory attributed to pulmonary hypertension and diastolic heart failure.  On my review, echo 04/11/2017 showed normal LV systolic and diastolic function. Normal RV. Normal IVC, RAP 3 mmHg. PASP 38 mmHg. Had a CPX 08/03/2017, notable for immediate desaturations with exercise to 87%, improved during recovery. Mild to moderate functional limitation, no cardiovascular limitation. There was chronotropic incompetence, with peak heart rate of 81 bpm.  Echo done at [facility] 03/20/20 notes normal LV systolic function, grade 1 diastolic dysfunction, normal RV. Pulm pressures not estimated due to insufficient TR jet. IVC not well visualized. E/A 0.8, E/e' 12.8. TR peak velocity noted at 3 m/s, which could be extrapolated to 36 mmHg. TAPSE normal.  Today: Tearful in the office today. Has been charting O2, BP, HR since out last visit, all normal. Breathing has been normal. Had mild Covid in November. Main symptom was loss of taste/smell. Father was hospitalized with Covid at the same time.   We discussed options, including RHC to formally assess for pulmonary hypertension vs continued monitoring. Discussed RHC procedure. She would like to hold off on RHC at this time.   Denies chest  pain, changes in shortness of breath. No PND, orthopnea, LE edema or unexpected weight gain. No syncope or palpitations.  Past Medical History:  Diagnosis Date  . Anxiety   . Arthritis   . Asthma   . Balance problems   . Bipolar disorder ([place])   . Charcot ankle   . Chronic fatigue   . Chronic kidney disease    STAGE 3-4  . Depression   . Diabetes mellitus   . DKA, type 1 ([place]) 11/04/2011  . Elevated cholesterol   . Fibromyalgia   . GERD (gastroesophageal reflux disease)   . Headache   . History of suicidal ideation   . Hyperlipemia   . Hypertension   . Hypothyroidism   . IBS (irritable bowel syndrome)   . Memory changes   . Obesity   . Sleep apnea    HAS C -PAP / DOES NOT USE  . Stress incontinence    Pt had surgery to correct this.  . Tachycardia   . Tobacco abuse   . Tremor   . UTI (lower urinary tract infection)     Past Surgical History:  Procedure Laterality Date  . INCONTINENCE SURGERY    . NASAL FRACTURE SURGERY    . ovary removed    . OVARY SURGERY    . PUBOVAGINAL SLING  08/16/2011   Procedure: [name];  Surgeon: Bernestine Amass, MD;  Location: [facility];  Service: Urology;  Laterality: N/A;         . UTERINE FIBROID SURGERY  2001    Current Medications: Current Outpatient Medications on File Prior to  Visit  Medication Sig  . APPLE CIDER VINEGAR PO Take 1 tablet by mouth daily.  Marland Kitchen aspirin EC 81 MG tablet Take 81 mg by mouth at bedtime.   . baclofen (LIORESAL) 10 MG tablet Take 10 mg by mouth 3 (three) times daily as needed.  Marland Kitchen buPROPion (WELLBUTRIN XL) 300 MG 24 hr tablet Take 1 tablet (300 mg total) by mouth daily.  . carvedilol (COREG) 25 MG tablet Take 25 mg by mouth 2 (two) times daily with a meal.  . Cholecalciferol 50 MCG (2000 UT) TABS Take 1 tablet by mouth daily.   . Coenzyme Q10 (COQ-10 PO) Take 1 capsule by mouth daily.  . Continuous Blood Gluc Sensor MISC 1 each by Does not apply route as directed. Use as directed every 14 days.  May dispense FreeStyle [employer] or similar.  . dicyclomine (BENTYL) 10 MG capsule Take 10 mg by mouth 3 (three) times daily as needed for spasms.  . DULoxetine (CYMBALTA) 60 MG capsule Take 1 capsule (60 mg total) by mouth daily.  . fluticasone (FLONASE) 50 MCG/ACT nasal spray Place 2 sprays into the nose daily as needed for allergies.   . folic acid (FOLVITE) 536 MCG tablet Take 400 mcg by mouth every evening.   . furosemide (LASIX) 20 MG tablet Take 20 mg by mouth daily.   Marland Kitchen gabapentin (NEURONTIN) 300 MG capsule Take 300 mg by mouth 3 (three) times daily.  Marland Kitchen glucagon (GLUCAGEN HYPOKIT) 1 MG SOLR injection GlucaGen HypoKit 1 mg Injection  . HYDROcodone-acetaminophen (NORCO/VICODIN) 5-325 MG tablet Take one tab po q 4 hrs prn pain (Patient taking differently: Take 0.5 tablets by mouth every 4 (four) hours as needed for moderate pain.)  . insulin glargine (LANTUS) 100 UNIT/ML injection Inject 0.5 mLs (50 Units total) into the skin daily. (Patient taking differently: Inject 50 Units into the skin daily as needed (when pump is not working).)  . lamoTRIgine (LAMICTAL) 200 MG tablet Take 1 tablet (200 mg total) by mouth daily.  Marland Kitchen leflunomide (ARAVA) 10 MG tablet Take 10 mg by mouth daily.  Marland Kitchen levothyroxine (SYNTHROID, LEVOTHROID) 150 MCG tablet Take 150 mcg by mouth daily before breakfast.  . LORazepam (ATIVAN) 0.5 MG tablet Take 1 tablet (0.5 mg total) by mouth 3 (three) times daily.  Marland Kitchen lubiprostone (AMITIZA) 8 MCG capsule Take 8 mcg by mouth daily.  Marland Kitchen NOVOLOG 100 UNIT/ML injection Inject 2.425-2.95 Units into the skin continuous. Via pump 7pm to midnight 2.950 8:30am -7pm2.925 Midnight - 8:30 2.425  . Omega-3 Fatty Acids (FISH OIL PO) Take 1 capsule by mouth daily.  Marland Kitchen omeprazole (PRILOSEC) 20 MG capsule Take 20 mg by mouth at bedtime.   . ondansetron (ZOFRAN ODT) 4 MG disintegrating tablet Take 1 tablet (4 mg total) by mouth every 8 (eight) hours as needed. (Patient taking differently:  Take 4 mg by mouth every 8 (eight) hours as needed for nausea or vomiting.)  . OXYGEN Inhale 3 L into the lungs as needed.  . pravastatin (PRAVACHOL) 80 MG tablet Take 80 mg by mouth at bedtime.  Marland Kitchen PROAIR HFA 108 (90 Base) MCG/ACT inhaler Inhale 1-2 puffs into the lungs every 6 (six) hours as needed for wheezing or shortness of breath.   . psyllium (REGULOID) 0.52 g capsule Take 0.52 g by mouth in the morning and at bedtime.   . risperiDONE (RISPERDAL) 0.5 MG tablet Take 1 tablet (0.5 mg total) by mouth at bedtime.  . rizatriptan (MAXALT) 5 MG tablet Take 5  mg by mouth as needed for migraine. May repeat in 2 hours if needed  . simethicone (MYLICON) 80 MG chewable tablet Chew by mouth.   No current facility-administered medications on file prior to visit.     Allergies:   Ciprofloxacin, Levaquin [levofloxacin], Buspar [buspirone], Linaclotide, Advair diskus [fluticasone-salmeterol], Biaxin [clarithromycin], and Hydroxyzine   Social History   Tobacco Use  . Smoking status: Former Smoker    Packs/day: 0.75    Years: 20.00    Pack years: 15.00    Types: Cigarettes    Quit date: 06/08/2011    Years since quitting: 8.9  . Smokeless tobacco: Never Used  Vaping Use  . Vaping Use: Never used  Substance Use Topics  . Alcohol use: No  . Drug use: No    Family History: family history includes Asthma in her mother; Bipolar disorder in her mother; Bladder Cancer in her paternal grandfather; Cancer in her paternal grandmother; Diabetes in her father; Heart disease in her father; Hyperlipidemia in her father; Hypertension in her father; Lymphoma in her father; Suicidality in her maternal grandfather; Thyroid disease in her brother and father. Gena Fray had MI, Mat Gma had MI, father has enlarged heart.   ROS:   Please see the history of present illness.  Additional pertinent ROS otherwise unremarkable.    EKGs/Labs/Other Studies Reviewed:    The following studies were reviewed today: See  summary in HPI  EKG:  EKG is personally reviewed.  The ekg ordered 04/22/20 demonstrates nsr at 68 bpm  Recent Labs: 09/12/2019: ALT 15; Hemoglobin 12.1; Platelets 255 10/01/2019: TSH 4.710 03/24/2020: BUN 29; Creatinine, Ser 1.98; Potassium 4.7; Pro B Natriuretic peptide (BNP) 168.0; Sodium 138  Recent Lipid Panel    Component Value Date/Time   CHOL 163 08/31/2018 0632   TRIG 108 08/31/2018 0632   HDL 62 08/31/2018 0632   CHOLHDL 2.6 08/31/2018 0632   VLDL 22 08/31/2018 0632   LDLCALC 79 08/31/2018 0632    Physical Exam:    VS:  BP 136/86   Pulse 70   Ht 5\' 9"  (1.753 m)   Wt (!) 326 lb (147.9 kg)   LMP 03/23/2017 (Approximate)   SpO2 97%   BMI 48.14 kg/m     Wt Readings from Last 3 Encounters:  05/19/20 (!) 326 lb (147.9 kg)  04/22/20 (!) 318 lb 12.8 oz (144.6 kg)  03/24/20 (!) 325 lb 12.8 oz (147.8 kg)    GEN: Well nourished, well developed in no acute distress HEENT: Normal, moist mucous membranes NECK: No JVD CARDIAC: regular rhythm, normal S1 and S2, no rubs or gallops. No murmur. VASCULAR: Radial and DP pulses 2+ bilaterally. No carotid bruits RESPIRATORY:  Clear to auscultation without rales, wheezing or rhonchi  ABDOMEN: Soft, non-tender, non-distended MUSCULOSKELETAL:  Ambulates independently SKIN: Warm and dry, no edema NEUROLOGIC:  Alert and oriented x 3. No focal neuro deficits noted. PSYCHIATRIC:  Normal affect   ASSESSMENT:    1. Chronic respiratory failure with hypoxia (HCC)   2. Primary hypertension   3. OSA (obstructive sleep apnea)   4. Type 1 diabetes mellitus with stage 4 chronic kidney disease (HCC)    PLAN:    Chronic respiratory failure with hypoxia, on O2 at night, with severe sleep apnea -this has been attributed to both chronic diastolic heart failure and pulmonary hypertension. However, I can find little evidence for these based on available data -she has not noted a pattern to her symptoms over the last month. Feels that things  are  stable -we discussed RHC as a next step. She would like to give things more time before deciding on this  Chronotropic incompetence -per CPX report -minimally active currently, but her symptoms have not ben exertional. Monitor, but does not appear to be largely contributing factor at this time. -first step would be to cut back beta blocker  Hypertension: -slightly above goal today, but she is tearful in the office, under significant stress -for now continue carvedilol 25mg  BID, furosemide 20 mg daily  Type I diabetes, with stage 4 chronic kidney disease -continue pravastatin, aspirin for primary prevention -last A1c 7.6  Cardiac risk counseling and prevention recommendations: -recommend heart healthy/Mediterranean diet, with whole grains, fruits, vegetable, fish, lean meats, nuts, and olive oil. Limit salt. -recommend moderate walking, 3-5 times/week for 30-50 minutes each session. Aim for at least 150 minutes.week. Goal should be pace of 3 miles/hours, or walking 1.5 miles in 30 minutes -recommend avoidance of tobacco products. Avoid excess alcohol. -Additional risk factor control:  -Lipids: per [facility], last LDL 79  -Weight: BMI 48, morbid obesity -ASCVD risk score: The 10-year ASCVD risk score Mikey Bussing DC Brooke Bonito., et al., 2013) is: 1.2%   Values used to calculate the score:     Age: 48 years     Sex: Female     Is Non-Hispanic African American: No     Diabetic: Yes     Tobacco smoker: No     Systolic Blood Pressure: 440 mmHg     Is BP treated: Yes     HDL Cholesterol: 94 MG/DL     Total Cholesterol: 176 MG/DL    Plan for follow up: 3 mos or sooner as needed  Buford Dresser, MD, PhD [place]  [facility]    Medication Adjustments/Labs and Tests Ordered: Current medicines are reviewed at length with the patient today.  Concerns regarding medicines are outlined above.  No orders of the defined types were placed in this encounter.  No orders of the defined types were  placed in this encounter.   Patient Instructions  Medication Instructions:  Your Physician recommend you continue on your current medication as directed.    *If you need a refill on your cardiac medications before your next appointment, please call your pharmacy*   Lab Work: None   Testing/Procedures: None   Follow-Up: At [facility], you and your health needs are our priority.  As part of our continuing mission to provide you with exceptional heart care, we have created designated Provider Care Teams.  These Care Teams include your primary Cardiologist (physician) and Advanced Practice Providers (APPs -  Physician Assistants and Nurse Practitioners) who all work together to provide you with the care you need, when you need it.  We recommend signing up for the patient portal called "MyChart".  Sign up information is provided on this After Visit Summary.  MyChart is used to connect with patients for Virtual Visits (Telemedicine).  Patients are able to view lab/test results, encounter notes, upcoming appointments, etc.  Non-urgent messages can be sent to your provider as well.   To learn more about what you can do with MyChart, go to [email or web address].    Your next appointment:   3 month(s)  The format for your next appointment:   In Person  Provider:   Buford Dresser, MD       Signed, Buford Dresser, MD PhD 05/19/2020 6:22 PM    [place]

## 2020-05-19 NOTE — Patient Instructions (Signed)

## 2020-05-27 ENCOUNTER — Ambulatory Visit (INDEPENDENT_AMBULATORY_CARE_PROVIDER_SITE_OTHER): Payer: BC Managed Care – PPO | Admitting: Pulmonary Disease

## 2020-05-27 ENCOUNTER — Encounter: Payer: Self-pay | Admitting: Pulmonary Disease

## 2020-05-27 ENCOUNTER — Other Ambulatory Visit: Payer: Self-pay

## 2020-05-27 DIAGNOSIS — J9611 Chronic respiratory failure with hypoxia: Secondary | ICD-10-CM | POA: Diagnosis not present

## 2020-05-27 DIAGNOSIS — G4733 Obstructive sleep apnea (adult) (pediatric): Secondary | ICD-10-CM

## 2020-05-27 NOTE — Patient Instructions (Signed)
Oxygen level appears better today.  CPAP is working well on current settings, continue using at least 4 to 6 hours every night If leak increases, consider using nasal mask instead of pillows.  We will check nocturnal oximetry on CPAP/room air prior to next visit to see if you still need oxygen

## 2020-05-27 NOTE — Assessment & Plan Note (Signed)
Oxygen level appears better today. We will check nocturnal oximetry on CPAP/room air prior to next visit to see if you still need oxygen  Attributed to pulmonary hypertension, recent Covid infection is confounding but overall seems to have improved.  If persistent hypoxia may need repeat right heart cath but given improvement this may not be necessary

## 2020-05-27 NOTE — Assessment & Plan Note (Signed)
CPAP download was reviewed which confirms good compliance more than 5 hours average per night, good control of events and large leak, average pressure 13 cm maximum pressure 14  Weight loss encouraged, compliance with goal of at least 4-6 hrs every night is the expectation. Advised against medications with sedative side effects Cautioned against driving when sleepy - understanding that sleepiness will vary on a day to day basis
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
## 2020-05-27 NOTE — Progress Notes (Signed)
Subjective:    Patient ID: Jasmine [name], female    DOB: [birth year], 49 y.o.   MRN: [id number]  HPI  49 yo IDDM  for FU of OSA &  hypoxia and shortness of breath, mild pulmonary hypertension.  PMH -IDDM on insulin pump, CKD stage III,multiple lung nodules stable from 20 18-20 20 RA -diagnosed 02/2020 ([facility]) - on arava She reports longstanding history of asthma for which she takes albuterol MDI as needed. CKD stage III,multiple lung nodules stable from 20 18-20 20 RA -diagnosed 02/2020 ([facility]) - on arava She reports longstanding history of asthma for which she takes albuterol MDI as needed. This is worse during the pollen season  Last office visit with me was 01/2020, reviewed last APP visit 03/2020. Since then she has received CPAP machine and is using it every night, she has settled with nasal pillows, denies any problems with mask or pressure.  She has been diagnosed with rheumatoid arthritis, was given [place] which is not working, prednisone prescription but she did not fill this due to side effects  She had Covid infection 3 weeks ago, husband was sick as was her father, all unvaccinated she only had a mild illness and did not require hospitalization now has a cough productive of clear sputum  Chest x-ray 03/24/2020 independently reviewed, no infiltrates  Significant tests/ events reviewed  Ambulation 10/05/19 OV >> dropped to 88%  04/2019 ABG 7.42/43/ 75   CT chest 04/2019 no emphysema, stable nodules  PFTs 04/2019-no airway obstruction, ratio 87, FEV1 79%, FVC 72%, TLC 82%, DLCO 76%  Echo 04/2017 normal LVEF, RVSP 38   CPAP titration >> 15 cm, Sm FF mask HST showed severe OSA - worse than before - avg 37 events/ hr with drop in O2 levels HST2/2018 -moderate OSA with AHI 25/hour especially worse when supine with AHI 35/hour and lowest desaturation of 65%  Past Medical History:  Diagnosis Date  . Anxiety   . Arthritis   . Asthma   . Balance problems   . Bipolar disorder ([place])   . Charcot ankle   . Chronic fatigue   . Chronic kidney disease    STAGE 3-4  . Depression   . Diabetes mellitus   . DKA, type 1 ([place]) 11/04/2011  . Elevated  cholesterol   . Fibromyalgia   . GERD (gastroesophageal reflux disease)   . Headache   . History of suicidal ideation   . Hyperlipemia   . Hypertension   . Hypothyroidism   . IBS (irritable bowel syndrome)   . Memory changes   . Obesity   . Sleep apnea    HAS C -PAP / DOES NOT USE  . Stress incontinence    Pt had surgery to correct this.  . Tachycardia   . Tobacco abuse   . Tremor   . UTI (lower urinary tract infection)     Review of Systems  neg for any significant sore throat, dysphagia, itching, sneezing, nasal congestion or excess/ purulent secretions, fever, chills, sweats, unintended wt loss, pleuritic or exertional cp, hempoptysis, orthopnea pnd or change in chronic leg swelling. Also denies presyncope, palpitations, heartburn, abdominal pain, nausea, vomiting, diarrhea or change in bowel or urinary habits, dysuria,hematuria, rash, arthralgias, visual complaints, headache, numbness weakness or ataxia.     Objective:   Physical Exam  Gen. Pleasant, obese, in no distress ENT - no lesions, no post nasal drip Neck: No JVD, no thyromegaly, no carotid bruits Lungs: no use of accessory muscles, no dullness to percussion, decreased without rales or rhonchi  Cardiovascular: Rhythm regular, heart sounds  normal, no murmurs or gallops, no peripheral edema Musculoskeletal: No deformities, no cyanosis or clubbing ,  no tremors       Assessment & Plan:

## 2020-06-10 ENCOUNTER — Encounter: Payer: Self-pay | Admitting: Psychiatry

## 2020-06-10 ENCOUNTER — Other Ambulatory Visit: Payer: Self-pay

## 2020-06-10 ENCOUNTER — Telehealth (INDEPENDENT_AMBULATORY_CARE_PROVIDER_SITE_OTHER): Payer: BC Managed Care – PPO | Admitting: Psychiatry

## 2020-06-10 DIAGNOSIS — F3181 Bipolar II disorder: Secondary | ICD-10-CM

## 2020-06-10 DIAGNOSIS — F419 Anxiety disorder, unspecified: Secondary | ICD-10-CM | POA: Diagnosis not present

## 2020-06-10 MED ORDER — DULOXETINE HCL 60 MG PO CPEP: 60.0000 mg | ORAL_CAPSULE | Freq: Every day | ORAL | 3 refills | Status: DC

## 2020-06-10 MED ORDER — LORAZEPAM 0.5 MG PO TABS: 0.5000 mg | ORAL_TABLET | Freq: Three times a day (TID) | ORAL | 0 refills | Status: DC

## 2020-06-10 MED ORDER — LAMOTRIGINE 200 MG PO TABS: 200.0000 mg | ORAL_TABLET | Freq: Every day | ORAL | 2 refills | Status: DC

## 2020-06-10 MED ORDER — BUPROPION HCL ER (XL) 300 MG PO TB24: 300.0000 mg | ORAL_TABLET | Freq: Every day | ORAL | 3 refills | Status: DC

## 2020-06-10 MED ORDER — RISPERIDONE 0.5 MG PO TABS
0.5000 mg | ORAL_TABLET | Freq: Every day | ORAL | 0 refills | Status: DC
Start: 2020-06-29 — End: 2020-07-29

## 2020-06-10 NOTE — Patient Instructions (Signed)
1. Continue lamotrigine 200 mg daily  2.Continue duloxetine 60 mgdaily  3.Continuebupropion300 mg daily  4.Continuerisperidone 0.5 mg at night 5. Continue lorazepam 0.5 mg daily as needed for anxiety 6Next appointment:2/22 at

## 2020-06-11 ENCOUNTER — Other Ambulatory Visit (HOSPITAL_COMMUNITY): Payer: Self-pay | Admitting: Obstetrics & Gynecology

## 2020-06-11 DIAGNOSIS — Z1231 Encounter for screening mammogram for malignant neoplasm of breast: Secondary | ICD-10-CM

## 2020-06-12 ENCOUNTER — Inpatient Hospital Stay (HOSPITAL_COMMUNITY): Admission: RE | Admit: 2020-06-12 | Payer: Medicare Other | Source: Ambulatory Visit

## 2020-06-12 DIAGNOSIS — Z1231 Encounter for screening mammogram for malignant neoplasm of breast: Secondary | ICD-10-CM

## 2020-06-27 ENCOUNTER — Telehealth: Payer: Self-pay | Admitting: Pulmonary Disease

## 2020-06-27 NOTE — Telephone Encounter (Signed)
RA these papers are in your look at folder. FYI

## 2020-07-08 NOTE — Telephone Encounter (Signed)
Done

## 2020-07-08 NOTE — Telephone Encounter (Signed)
[name], do you know if these were completed and faxed back? Thanks.
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
## 2020-07-27 ENCOUNTER — Encounter: Payer: Self-pay | Admitting: Cardiology

## 2020-07-29 ENCOUNTER — Encounter: Payer: Self-pay | Admitting: Psychiatry

## 2020-07-29 ENCOUNTER — Telehealth (INDEPENDENT_AMBULATORY_CARE_PROVIDER_SITE_OTHER): Payer: BC Managed Care – PPO | Admitting: Psychiatry

## 2020-07-29 ENCOUNTER — Other Ambulatory Visit: Payer: Self-pay

## 2020-07-29 DIAGNOSIS — F3181 Bipolar II disorder: Secondary | ICD-10-CM

## 2020-07-29 DIAGNOSIS — F411 Generalized anxiety disorder: Secondary | ICD-10-CM

## 2020-07-29 MED ORDER — RISPERIDONE 0.5 MG PO TABS: 0.5000 mg | ORAL_TABLET | Freq: Every day | ORAL | 1 refills | Status: DC

## 2020-08-05 ENCOUNTER — Other Ambulatory Visit: Payer: Self-pay | Admitting: Orthopedic Surgery

## 2020-08-05 DIAGNOSIS — M25562 Pain in left knee: Secondary | ICD-10-CM

## 2020-08-11 ENCOUNTER — Other Ambulatory Visit: Payer: Medicare Other

## 2020-08-22 ENCOUNTER — Ambulatory Visit: Payer: BC Managed Care – PPO | Admitting: Cardiology

## 2020-09-01 ENCOUNTER — Other Ambulatory Visit (HOSPITAL_COMMUNITY): Payer: Self-pay | Admitting: Psychiatry

## 2020-09-01 DIAGNOSIS — F3181 Bipolar II disorder: Secondary | ICD-10-CM

## 2020-09-15 ENCOUNTER — Ambulatory Visit: Payer: Medicare Other | Admitting: Pulmonary Disease

## 2020-09-16 ENCOUNTER — Telehealth: Payer: Self-pay

## 2020-09-16 NOTE — Telephone Encounter (Signed)
She will have enough lorazepam until the next visit. Will hold for another order at this time.

## 2020-09-16 NOTE — Telephone Encounter (Signed)
Pharmacy notified.

## 2020-09-16 NOTE — Telephone Encounter (Signed)
call from [facility] from [facility]. she states that pt changed pharmacy to then and that they need a rx sent for the lorazepam to be placed on hold. pt just got rx filled on 08-29-20
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
## 2020-09-23 ENCOUNTER — Encounter: Payer: Self-pay | Admitting: Psychiatry

## 2020-09-23 ENCOUNTER — Telehealth (INDEPENDENT_AMBULATORY_CARE_PROVIDER_SITE_OTHER): Payer: BC Managed Care – PPO | Admitting: Psychiatry

## 2020-09-23 ENCOUNTER — Other Ambulatory Visit: Payer: Self-pay

## 2020-09-23 DIAGNOSIS — F3181 Bipolar II disorder: Secondary | ICD-10-CM | POA: Diagnosis not present

## 2020-09-23 MED ORDER — LAMOTRIGINE 200 MG PO TABS
200.0000 mg | ORAL_TABLET | Freq: Every day | ORAL | 3 refills | Status: DC
Start: 2020-09-30 — End: 2020-11-04

## 2020-09-23 MED ORDER — RISPERIDONE 1 MG PO TABS: 1.0000 mg | ORAL_TABLET | Freq: Every day | ORAL | 1 refills | Status: DC

## 2020-09-23 NOTE — Patient Instructions (Signed)
1. Continue lamotrigine 200 mg daily  2.Continue duloxetine 60 mgdaily 3.Continuebupropion300 mg daily  4.Inreaserisperidone 1 mg at night 5.Discontinue lorazepam 6Next appointment:5/31 at 9:30

## 2020-10-06 ENCOUNTER — Telehealth: Payer: Self-pay

## 2020-10-06 ENCOUNTER — Ambulatory Visit: Payer: BC Managed Care – PPO | Admitting: Cardiology

## 2020-10-06 NOTE — Telephone Encounter (Signed)
Pharmacy notified.

## 2020-10-06 NOTE — Telephone Encounter (Signed)
Received fax requesting a refill on the lorazepam    LORazepam (ATIVAN) 0.5 MG tablet Medication Date: 06/10/2020 Department: [facility] Ordering/Authorizing: Norman Clay, MD    Order Providers  Prescribing Provider Encounter Provider  Norman Clay, MD Norman Clay, MD   Outpatient Medication Detail   Disp Refills Start End   LORazepam (ATIVAN) 0.5 MG tablet 30 tablet 0 07/04/2020    Sig - Route: Take 1 tablet (0.5 mg total) by mouth 3 (three) times daily. - Oral   Sent to pharmacy as: LORazepam (ATIVAN) 0.5 MG tablet   Notes to Pharmacy: Fill after 1/23   E-Prescribing Status: Receipt confirmed by pharmacy (06/10/2020 3:22 PM EST)    Associated Diagnoses  Bipolar 2 disorder, major depressive episode [facility])      Pharmacy  [place], [facility]

## 2020-10-06 NOTE — Telephone Encounter (Signed)
Decline- this medication was discontinued

## 2020-10-16 ENCOUNTER — Ambulatory Visit: Payer: BC Managed Care – PPO | Admitting: Student
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
## 2020-11-04 ENCOUNTER — Other Ambulatory Visit: Payer: Self-pay

## 2020-11-04 ENCOUNTER — Other Ambulatory Visit: Payer: Self-pay | Admitting: Psychiatry

## 2020-11-04 ENCOUNTER — Telehealth: Payer: Medicare Other | Admitting: Psychiatry

## 2020-11-04 DIAGNOSIS — F3181 Bipolar II disorder: Secondary | ICD-10-CM

## 2020-11-04 MED ORDER — RISPERIDONE 1 MG PO TABS: 1.0000 mg | ORAL_TABLET | Freq: Every day | ORAL | 0 refills | Status: DC

## 2020-11-04 MED ORDER — LAMOTRIGINE 200 MG PO TABS: 200.0000 mg | ORAL_TABLET | Freq: Every day | ORAL | 3 refills | Status: DC

## 2020-11-04 MED ORDER — BUPROPION HCL ER (XL) 300 MG PO TB24: 300.0000 mg | ORAL_TABLET | Freq: Every day | ORAL | 3 refills | Status: DC

## 2020-11-04 MED ORDER — DULOXETINE HCL 60 MG PO CPEP: 60.0000 mg | ORAL_CAPSULE | Freq: Every day | ORAL | 3 refills | Status: DC

## 2020-11-27 ENCOUNTER — Other Ambulatory Visit: Payer: Self-pay

## 2020-11-27 ENCOUNTER — Encounter: Payer: Self-pay | Admitting: Psychiatry

## 2020-11-27 ENCOUNTER — Telehealth (INDEPENDENT_AMBULATORY_CARE_PROVIDER_SITE_OTHER): Payer: Medicare Other | Admitting: Psychiatry

## 2020-11-27 DIAGNOSIS — F3181 Bipolar II disorder: Secondary | ICD-10-CM

## 2020-11-27 DIAGNOSIS — F411 Generalized anxiety disorder: Secondary | ICD-10-CM

## 2020-11-27 MED ORDER — LORAZEPAM 0.5 MG PO TABS
0.5000 mg | ORAL_TABLET | Freq: Every day | ORAL | 1 refills | Status: DC | PRN
Start: 2020-11-27 — End: 2021-01-26

## 2020-11-27 MED ORDER — LORAZEPAM 0.5 MG PO TABS
0.5000 mg | ORAL_TABLET | Freq: Every day | ORAL | 1 refills | Status: DC | PRN
Start: 2020-11-27 — End: 2020-11-27

## 2020-11-27 MED ORDER — RISPERIDONE 1 MG PO TABS: 1.0000 mg | ORAL_TABLET | Freq: Every day | ORAL | 1 refills | Status: DC
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
## 2020-11-27 NOTE — Patient Instructions (Signed)
1. Continue lamotrigine 200 mg daily 2. Continue duloxetine 60 mg daily  3. Continue bupropion 300 mg daily   4. Continue risperidone 1 mg at night  5. Restart lorazepam 0.5 mg daily as needed for anxiety 6  Next appointment: 8/22 at 11:30

## 2020-12-07 NOTE — Progress Notes (Deleted)
Cardiology Office Note:    Date:  12/07/2020   ID:  Jasmine [name], DOB [birth year], MRN [id number]  PCP:  Aletha Halim., PA-C  Cardiologist:  Buford Dresser, MD  Electrophysiologist:  None   Referring MD: Aletha Halim., PA-C   Chief Complaint: follow-up of mild diastolic dysfunction and chronotropic incompetence   History of Present Illness:    Jasmine [name] is a 50 y.o. female with a history of chronotropic incompetence on CPX in 2019, chronic hypoxic respiratory failure, severe obstructive sleep apnea on CPAP, hypertension, hyperlipidemia, type 1 diabetes mellitus, hypothyroidism, CKD stage IV, fibromyalgia, rheumatoid arthritis, bipolar disorder, and prior tobacco abuse who is followed by Dr. Harrell Gave and presents today for routine follow-up.   Patient was referred to Dr. Harrell Gave in 11/021 for further evaluation and management of diastolic dysfunction. Patient has a history of chronic hypoxic respiratory that had been felt to be secondary to pulmonary hypertension and diastolic CHF. She is followed by Pulmonology. Prior Echo in 04/2017 showed LVEF of 60-65% with normal diastolic function and mild pulmonary hypertension with PASP of 38 mmHg. CPX in 07/2017 was notable for immediate desaturations with exercise to 87% that improved during recovery and mild to moderate functional limitation with no cardiovascular limitation. She was also noted to have chronotropic incompetence with peak heart rate of 81 bpm. Echo in 03/2020 at [facility] showed normal LV function with grade 1 diastolic dysfunction. RV was normal but pulmonary pressures were not estimated due to insufficient TR jet. Patient was last seen by Dr. Harrell Gave in 05/2020 at which time patient's breathing was stable and she denied any chest pain or signs of CHF.Right heart catheterization was offered for definitive evaluation of pulmonary hypertension but patient preferred to hold off on this for now.   Chronic  Hypoxic Respiratory Failure on Home O2 at Night Severe Obstructive Sleep Apnea on CPAP Mild Pulmonary Hypertension Grade 1 Diastolic Dysfunction - Patient has chronic hypoxic respiratory failure which has  been felt to be attributed to both pulmonary hypertension and chronic diastolic CHF from Pulmonology. However, there has bee little evidence of significant CHF. Only grade 1 diastolic dysfunction noted on Echo in 03/2020 at [facility].  - Symptoms stable. *** Appears euvolemic on exam. *** - Continue Lasix '20mg'$  daily. Dosing per Nephrology. - Continue CPAP for sleep apnea. Managed by Pulmonology. - Right heart catheterization has been offered in the past. ***  Chronotropic Incompetence - Noted on CPX in 2019.  - Patient is minimally active at baseline but symptoms have not been exertional. Therefore, chronotropic incompetence has not been felt to be a largely contributing factor.  - Continue to monitor at this time. If symptoms become more exertional, considering decreasing Coreg.  Hypertension - *** - Continue Coreg '25mg'$  twice daily.   Hyperlipidemia - LDL 53 in 03/2020.  - Continue Pravastatin '80mg'$  daily.  Type 1 Diabetes - Most recent hemoglobin A1c 6.8 in 10/2020.  - Managed by Endocrinology at [facility].  CKD Stage IV - Most recent creatinine 2.9 in 10/2020. GFR 19.  - Followed by Nephrology at [facility].   Past Medical History:  Diagnosis Date   Anxiety    Arthritis    Asthma    Balance problems    Bipolar disorder ([place])    Charcot ankle    Chronic fatigue    Chronic kidney disease    STAGE 3-4   Depression    Diabetes mellitus    DKA, type 1 ([place]) 11/04/2011  Elevated cholesterol    Fibromyalgia    GERD (gastroesophageal reflux disease)    Headache    History of suicidal ideation    Hyperlipemia    Hypertension    Hypothyroidism    IBS (irritable bowel syndrome)    Memory changes    Obesity    Sleep apnea    HAS C -PAP / DOES NOT USE   Stress  incontinence    Pt had surgery to correct this.   Tachycardia    Tobacco abuse    Tremor    UTI (lower urinary tract infection)     Past Surgical History:  Procedure Laterality Date   INCONTINENCE SURGERY     NASAL FRACTURE SURGERY     ovary removed     OVARY SURGERY     PUBOVAGINAL SLING  08/16/2011   Procedure: [name];  Surgeon: Bernestine Amass, MD;  Location: [facility];  Service: Urology;  Laterality: N/A;          UTERINE FIBROID SURGERY  2001    Current Medications: No outpatient medications have been marked as taking for the 12/15/20 encounter (Appointment) with Darreld Mclean, PA-C.     Allergies:   Ciprofloxacin, Levaquin [levofloxacin], Buspar [buspirone], Linaclotide, Advair diskus [fluticasone-salmeterol], Biaxin [clarithromycin], and Hydroxyzine   Social History   Socioeconomic History   Marital status: Married    Spouse name: Not on file   Number of children: 0   Years of education: 12   Highest education level: Not on file  Occupational History   Occupation: Disabled  Tobacco Use   Smoking status: Former    Packs/day: 0.75    Years: 20.00    Pack years: 15.00    Types: Cigarettes    Quit date: 06/08/2011    Years since quitting: 9.5   Smokeless tobacco: Never  Vaping Use   Vaping Use: Never used  Substance and Sexual Activity   Alcohol use: No   Drug use: No   Sexual activity: Yes    Birth control/protection: Post-menopausal  Other Topics Concern   Not on file  Social History Narrative   Lives at home with husband.   Right-handed.   Occasional caffeine use.   Social Determinants of Health   Financial Resource Strain: Not on file  Food Insecurity: Not on file  Transportation Needs: Not on file  Physical Activity: Not on file  Stress: Not on file  Social Connections: Not on file     Family History: The patient's family history includes Asthma in her mother; Bipolar disorder in her mother; Bladder Cancer in her paternal  grandfather; Cancer in her paternal grandmother; Diabetes in her father; Heart disease in her father; Hyperlipidemia in her father; Hypertension in her father; Lymphoma in her father; Suicidality in her maternal grandfather; Thyroid disease in her brother and father.  ROS:   Please see the history of present illness.     EKGs/Labs/Other Studies Reviewed:    The following studies were reviewed today:  Echocardiogram 04/11/2017: Study Conclusions: - Left ventricle: The cavity size was normal. Systolic function was    normal. The estimated ejection fraction was in the range of 60%    to 65%. Wall motion was normal; there were no regional wall    motion abnormalities. Left ventricular diastolic function    parameters were normal.  - Pulmonary arteries: PA peak pressure: 38 mm Hg (S).   Impressions:  - The right ventricular systolic pressure was increased consistent  with mild pulmonary hypertension.  _______________  CPX 08/03/2017: Conclusion: Exercise testing with gas exchange demonstrates mild to moderate functional limitation when compared to matched sedentary norms. Pre-exercise spirometry demonstrates mild restrictive properties which are likely related to her specific distribution of weight. There is no clear cardiovascular limitation or exercise-induced bronchospasm. Patient is most likely limited due to severe chronotropic incompetence and poor saturations with exertion.  _______________  Echocardiogram 03/20/2020 ([facility]): Summary:   1. The left ventricle is normal in size with normal wall thickness.    2. The left ventricular systolic function is normal, LVEF is visually estimated at 65-70%.    3. There is grade I diastolic dysfunction (impaired relaxation).    4. The left atrium is mildly to moderately dilated in size.    5. The right ventricle is normal in size, with normal systolic function.    EKG:  EKG not ordered today.  Recent Labs: 03/24/2020: BUN 29; Creatinine,  Ser 1.98; Potassium 4.7; Pro B Natriuretic peptide (BNP) 168.0; Sodium 138  Recent Lipid Panel    Component Value Date/Time   CHOL 163 08/31/2018 0632   TRIG 108 08/31/2018 0632   HDL 62 08/31/2018 0632   CHOLHDL 2.6 08/31/2018 0632   VLDL 22 08/31/2018 0632   LDLCALC 79 08/31/2018 0632    Physical Exam:    Vital Signs: LMP 03/23/2017 (Approximate)     Wt Readings from Last 3 Encounters:  05/27/20 (!) 325 lb 6.4 oz (147.6 kg)  05/19/20 (!) 326 lb (147.9 kg)  04/22/20 (!) 318 lb 12.8 oz (144.6 kg)     General: 50 y.o. female in no acute distress. HEENT: Normocephalic and atraumatic. Sclera clear. EOMs intact. Neck: Supple. No carotid bruits. No JVD. Heart: *** RRR. Distinct S1 and S2. No murmurs, gallops, or rubs. Radial and distal pedal pulses 2+ and equal bilaterally. Lungs: No increased work of breathing. Clear to ausculation bilaterally. No wheezes, rhonchi, or rales.  Abdomen: Soft, non-distended, and non-tender to palpation. Bowel sounds present in all 4 quadrants.  MSK: Normal strength and tone for age. *** Extremities: No lower extremity edema.    Skin: Warm and dry. Neuro: Alert and oriented x3. No focal deficits. Psych: Normal affect. Responds appropriately.   Assessment:    No diagnosis found.  Plan:     Disposition: Follow up in ***   Medication Adjustments/Labs and Tests Ordered: Current medicines are reviewed at length with the patient today.  Concerns regarding medicines are outlined above.  No orders of the defined types were placed in this encounter.  No orders of the defined types were placed in this encounter.   There are no Patient Instructions on file for this visit.   Signed, Darreld Mclean, PA-C  12/07/2020 1:09 PM    [facility]

## 2020-12-09 ENCOUNTER — Ambulatory Visit (INDEPENDENT_AMBULATORY_CARE_PROVIDER_SITE_OTHER): Payer: BC Managed Care – PPO | Admitting: Pulmonary Disease

## 2020-12-09 ENCOUNTER — Other Ambulatory Visit: Payer: Self-pay

## 2020-12-09 ENCOUNTER — Encounter: Payer: Self-pay | Admitting: Pulmonary Disease

## 2020-12-09 DIAGNOSIS — J9611 Chronic respiratory failure with hypoxia: Secondary | ICD-10-CM

## 2020-12-09 DIAGNOSIS — G4733 Obstructive sleep apnea (adult) (pediatric): Secondary | ICD-10-CM

## 2020-12-09 DIAGNOSIS — I5032 Chronic diastolic (congestive) heart failure: Secondary | ICD-10-CM

## 2020-12-09 NOTE — Progress Notes (Signed)
   Subjective:    Patient ID: Jasmine [name], female    DOB: 1971-02-15, 50 y.o.   MRN: [id number]  HPI  50 yo IDDM  for FU of OSA &  hypoxia and shortness of breath, mild pulmonary hypertension.   PMH -IDDM on insulin pump, CKD stage III, multiple lung nodules stable from 20 18-20 20 RA -diagnosed 02/2020 ([place])  She reports longstanding history of asthma for which she takes albuterol MDI as needed.  This is worse during the pollen season Covid infection 05/2020   Chief Complaint  Patient presents with   Follow-up    Patient feeling good, sleeping good, having back pain/problems. Has not done ONO but has checked oxygen at home and it runs low.    She self discontinued her leflunomide because did not feel this was helping, she still complains of's joint stiffness in the mornings. Oxygen level seems to drop when she lies down occasionally, she does have oxygen blended into her CPAP machine.  Oxygen saturation 95% today. She has back pain and arrives in a wheelchair today -she has used epidural injections and dry needling and chiropractor without any benefit.  Not a candidate for surgery She is compliant with her CPAP and denies any problems with mask or pressure.  She denies excessive tiredness or sleepiness in the daytime   Significant tests/ events reviewed  Ambulation 10/05/19 OV >> dropped to 88%  04/2019 ABG 7.42/43/ 75     CT chest 04/2019 no emphysema, stable nodules   PFTs 04/2019-no airway obstruction, ratio 87, FEV1 79%, FVC 72%, TLC 82%, DLCO 76%   Echo 04/2017 normal LVEF, RVSP 38     CPAP titration >> 15 cm, Sm FF mask HST showed severe OSA - worse than before - avg 37 events/ hr with drop in O2 levels HST 07/2016 - moderate OSA with AHI 25/hour especially worse when supine with AHI 35/hour and lowest desaturation of 65%   Review of Systems  neg for any significant sore throat, dysphagia, itching, sneezing, nasal congestion or excess/ purulent secretions,  fever, chills, sweats, unintended wt loss, pleuritic or exertional cp, hempoptysis, orthopnea pnd or change in chronic leg swelling. Also denies presyncope, palpitations, heartburn, abdominal pain, nausea, vomiting, diarrhea or change in bowel or urinary habits, dysuria,hematuria, rash, arthralgias, visual complaints, headache, numbness weakness or ataxia.     Objective:   Physical Exam  Gen. Pleasant, obese, in no distress ENT - no lesions, no post nasal drip Neck: No JVD, no thyromegaly, no carotid bruits Lungs: no use of accessory muscles, no dullness to percussion, decreased without rales or rhonchi  Cardiovascular: Rhythm regular, heart sounds  normal, no murmurs or gallops, no peripheral edema Musculoskeletal: No deformities, no cyanosis or clubbing , no tremors       Assessment & Plan:

## 2020-12-09 NOTE — Assessment & Plan Note (Signed)
She still has oxygen.  Prior CPAP titration has not shown need for oxygen.  Unable to ambulate her today due to her severe back pain but her oxygen saturation at rest is okay.  I will asked her to continue using oxygen as needed for now

## 2020-12-09 NOTE — Patient Instructions (Addendum)
CPAP is working well on current settings We may have to reassess O2 in the future Please get covid vaccine

## 2020-12-09 NOTE — Assessment & Plan Note (Signed)
CPAP download was reviewed which shows good compliance about 5.5 hours every night with no missed nights, leak has improved significantly over the past week, good control of events on auto settings with average pressure of 13 cm. CPAP is certainly helped improve her daytime somnolence and fatigue  Weight loss encouraged, compliance with goal of at least 4-6 hrs every night is the expectation. Advised against medications with sedative side effects Cautioned against driving when sleepy - understanding that sleepiness will vary on a day to day basis

## 2020-12-09 NOTE — Assessment & Plan Note (Addendum)
Very mild pulmonary hypertension was noted on previous echo in 2018, this will need follow-up in the future but appears secondary I once again encouraged her to get COVID vaccinated given her multiple medical problems

## 2020-12-15 ENCOUNTER — Ambulatory Visit: Payer: BC Managed Care – PPO | Admitting: Student

## 2021-01-18 ENCOUNTER — Ambulatory Visit
Admission: RE | Admit: 2021-01-18 | Discharge: 2021-01-18 | Disposition: A | Discharge status: Home / Self Care | Payer: Medicare Other | Source: Ambulatory Visit | Attending: Orthopedic Surgery | Admitting: Orthopedic Surgery

## 2021-01-18 DIAGNOSIS — M25562 Pain in left knee: Secondary | ICD-10-CM

## 2021-01-19 ENCOUNTER — Ambulatory Visit: Payer: Medicare Other | Admitting: Physical Therapy

## 2021-01-22 ENCOUNTER — Ambulatory Visit: Payer: Medicare Other | Admitting: Physical Therapy
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
## 2021-01-26 ENCOUNTER — Telehealth (INDEPENDENT_AMBULATORY_CARE_PROVIDER_SITE_OTHER): Payer: Medicare Other | Admitting: Psychiatry

## 2021-01-26 ENCOUNTER — Encounter: Payer: Self-pay | Admitting: Psychiatry

## 2021-01-26 ENCOUNTER — Other Ambulatory Visit: Payer: Self-pay

## 2021-01-26 DIAGNOSIS — F411 Generalized anxiety disorder: Secondary | ICD-10-CM

## 2021-01-26 DIAGNOSIS — F3181 Bipolar II disorder: Secondary | ICD-10-CM

## 2021-01-26 MED ORDER — LAMOTRIGINE 200 MG PO TABS: 200.0000 mg | ORAL_TABLET | Freq: Every day | ORAL | 3 refills | Status: DC

## 2021-01-26 MED ORDER — BUPROPION HCL ER (XL) 300 MG PO TB24: 300.0000 mg | ORAL_TABLET | Freq: Every day | ORAL | 3 refills | Status: DC

## 2021-01-26 MED ORDER — LORAZEPAM 0.5 MG PO TABS: 0.5000 mg | ORAL_TABLET | Freq: Every day | ORAL | 1 refills | Status: DC | PRN

## 2021-01-26 MED ORDER — DULOXETINE HCL 60 MG PO CPEP
60.0000 mg | ORAL_CAPSULE | Freq: Every day | ORAL | 3 refills | Status: DC
Start: 2021-03-05 — End: 2021-05-29

## 2021-01-26 NOTE — Patient Instructions (Signed)
1. Continue lamotrigine 200 mg daily 2. Continue duloxetine 60 mg daily  3. Continue bupropion 300 mg daily   4. Hold risperidone  5. Continue lorazepam 0.5 mg daily as needed for anxiety 6  Next appointment: 10/31 at 10 Am, in person 7. Please contact [facility] for DBT (therapy)  [phone]. Referral to IOP  The next visit will be in person visit. Please arrive 15 mins before the scheduled time.   [facility]  Address: [address], [place], [place] [address]

## 2021-01-28 ENCOUNTER — Telehealth (HOSPITAL_COMMUNITY): Payer: Self-pay | Admitting: Psychiatry

## 2021-01-28 ENCOUNTER — Ambulatory Visit: Payer: Medicare Other | Admitting: Physical Therapy

## 2021-01-28 NOTE — Telephone Encounter (Signed)
D:  Dr. Modesta Messing referred pt to [facility].  A:  Placed call to orient pt, but there was no answer.  Left vm requesting pt to give case manager a call.  Inform Dr. Modesta Messing.

## 2021-02-03 ENCOUNTER — Ambulatory Visit (HOSPITAL_BASED_OUTPATIENT_CLINIC_OR_DEPARTMENT_OTHER): Payer: Medicare Other | Admitting: Cardiology

## 2021-02-10 ENCOUNTER — Ambulatory Visit: Payer: BC Managed Care – PPO | Admitting: Neurology

## 2021-02-11 ENCOUNTER — Ambulatory Visit: Payer: BC Managed Care – PPO | Admitting: Neurology

## 2021-02-12 ENCOUNTER — Ambulatory Visit (HOSPITAL_BASED_OUTPATIENT_CLINIC_OR_DEPARTMENT_OTHER): Payer: Medicare Other | Admitting: Cardiology

## 2021-03-05 ENCOUNTER — Telehealth: Payer: Self-pay | Admitting: Emergency Medicine

## 2021-03-05 NOTE — Telephone Encounter (Signed)
Received call from a Dr. Chrissie Noa from [employer] and stated he had the patient on the other line.  Stated patient was having a difficult time standing up and needed to be seen ASAP today.  I explained that we do not do emergency visits and patient would best be seen by the ER where they can diagnose and treat.  Dr. Chrissie Noa agreed and told the patient to go to the emergency room.

## 2021-03-23 ENCOUNTER — Ambulatory Visit (INDEPENDENT_AMBULATORY_CARE_PROVIDER_SITE_OTHER): Payer: BC Managed Care – PPO | Admitting: Neurology

## 2021-03-23 ENCOUNTER — Encounter: Payer: Self-pay | Admitting: Neurology

## 2021-03-23 VITALS — BP 190/80 | HR 67

## 2021-03-23 DIAGNOSIS — G43709 Chronic migraine without aura, not intractable, without status migrainosus: Secondary | ICD-10-CM | POA: Diagnosis not present

## 2021-03-23 DIAGNOSIS — E1142 Type 2 diabetes mellitus with diabetic polyneuropathy: Secondary | ICD-10-CM

## 2021-03-23 DIAGNOSIS — Z79899 Other long term (current) drug therapy: Secondary | ICD-10-CM

## 2021-03-23 MED ORDER — RIZATRIPTAN BENZOATE 10 MG PO TABS: 10.0000 mg | ORAL_TABLET | ORAL | 11 refills | Status: DC | PRN

## 2021-03-23 MED ORDER — ONDANSETRON 4 MG PO TBDP: 4.0000 mg | ORAL_TABLET | Freq: Three times a day (TID) | ORAL | 6 refills | Status: DC | PRN
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
## 2021-04-01 ENCOUNTER — Telehealth: Payer: Self-pay

## 2021-04-01 ENCOUNTER — Other Ambulatory Visit: Payer: Self-pay | Admitting: Psychiatry

## 2021-04-01 DIAGNOSIS — F3181 Bipolar II disorder: Secondary | ICD-10-CM

## 2021-04-01 MED ORDER — LORAZEPAM 0.5 MG PO TABS: 0.5000 mg | ORAL_TABLET | Freq: Every day | ORAL | 0 refills | Status: DC | PRN

## 2021-04-01 NOTE — Telephone Encounter (Signed)
Ordered
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
## 2021-04-01 NOTE — Telephone Encounter (Signed)
received fax requesting a refill on the lorazepam

## 2021-04-06 ENCOUNTER — Encounter: Payer: Self-pay | Admitting: Psychiatry

## 2021-04-06 ENCOUNTER — Ambulatory Visit (INDEPENDENT_AMBULATORY_CARE_PROVIDER_SITE_OTHER): Payer: Medicare Other | Admitting: Psychiatry

## 2021-04-06 ENCOUNTER — Other Ambulatory Visit: Payer: Self-pay

## 2021-04-06 VITALS — BP 189/98 | HR 70

## 2021-04-06 DIAGNOSIS — F3181 Bipolar II disorder: Secondary | ICD-10-CM | POA: Diagnosis not present

## 2021-04-06 DIAGNOSIS — F411 Generalized anxiety disorder: Secondary | ICD-10-CM

## 2021-04-06 MED ORDER — LORAZEPAM 0.5 MG PO TABS: 0.5000 mg | ORAL_TABLET | Freq: Every day | ORAL | 1 refills | Status: DC | PRN

## 2021-04-06 NOTE — Addendum Note (Signed)
Addended by: Norman Clay on: 04/06/2021 10:22 AM   Modules accepted: Orders

## 2021-04-06 NOTE — Patient Instructions (Addendum)
Continue lamotrigine 200 mg daily Continue duloxetine 60 mg daily  Continue bupropion 300 mg daily   Continue lorazepam 0.5 mg daily as needed for anxiety  Next appointment: 1/23 at 11 AM for 30 mins, video Please contact [facility] for DBT (therapy)  [phone] Referral to IOP  CONTACT INFORMATION  What to do if you need to get in touch with someone regarding a psychiatric issue:  1. EMERGENCY: For psychiatric emergencies (if you are suicidal or if there are any other safety issues) call 911 and/or go to your nearest Emergency Room immediately.   2. IF YOU NEED SOMEONE TO TALK TO RIGHT NOW: Given my clinical responsibilities, I may not be able to speak with you over the phone for a prolonged period of time.  A. You may always call The National Suicide Prevention Lifeline at 1-800-273-TALK [phone]).  [place] of residence will also have local crisis services. For [facility]: [place] at [phone] ([place])

## 2021-04-07 ENCOUNTER — Telehealth (HOSPITAL_COMMUNITY): Payer: Self-pay | Admitting: Psychiatry

## 2021-04-07 NOTE — Telephone Encounter (Signed)
D:  Dr. Modesta Messing referred pt to [facility] again.  A:  Placed call to re-orient pt again, but there was no answer.  Left vm for pt to call case manager back to get scheduled.  Inform Dr. Modesta Messing.

## 2021-04-15 ENCOUNTER — Encounter (HOSPITAL_BASED_OUTPATIENT_CLINIC_OR_DEPARTMENT_OTHER): Payer: Self-pay | Admitting: Cardiology

## 2021-04-15 ENCOUNTER — Other Ambulatory Visit: Payer: Self-pay

## 2021-04-15 ENCOUNTER — Ambulatory Visit (INDEPENDENT_AMBULATORY_CARE_PROVIDER_SITE_OTHER): Payer: BC Managed Care – PPO | Admitting: Cardiology

## 2021-04-15 VITALS — BP 156/86 | HR 61 | Ht 69.0 in | Wt 322.4 lb

## 2021-04-15 DIAGNOSIS — N184 Chronic kidney disease, stage 4 (severe): Secondary | ICD-10-CM

## 2021-04-15 DIAGNOSIS — E1022 Type 1 diabetes mellitus with diabetic chronic kidney disease: Secondary | ICD-10-CM | POA: Diagnosis not present

## 2021-04-15 DIAGNOSIS — G4733 Obstructive sleep apnea (adult) (pediatric): Secondary | ICD-10-CM

## 2021-04-15 DIAGNOSIS — I10 Essential (primary) hypertension: Secondary | ICD-10-CM | POA: Diagnosis not present

## 2021-04-15 DIAGNOSIS — Z7189 Other specified counseling: Secondary | ICD-10-CM

## 2021-04-15 MED ORDER — AMLODIPINE BESYLATE 5 MG PO TABS: 5.0000 mg | ORAL_TABLET | Freq: Every day | ORAL | 3 refills | Status: DC

## 2021-04-15 NOTE — Patient Instructions (Signed)
Medication Instructions:  Start Amlodipine 5 mg daily  *If you need a refill on your cardiac medications before your next appointment, please call your pharmacy*   Lab Work: None ordered today   Testing/Procedures: None ordered today   Follow-Up: At [facility], you and your health needs are our priority.  As part of our continuing mission to provide you with exceptional heart care, we have created designated Provider Care Teams.  These Care Teams include your primary Cardiologist (physician) and Advanced Practice Providers (APPs -  Physician Assistants and Nurse Practitioners) who all work together to provide you with the care you need, when you need it.  We recommend signing up for the patient portal called "MyChart".  Sign up information is provided on this After Visit Summary.  MyChart is used to connect with patients for Virtual Visits (Telemedicine).  Patients are able to view lab/test results, encounter notes, upcoming appointments, etc.  Non-urgent messages can be sent to your provider as well.   To learn more about what you can do with MyChart, go to [email or web address].    Your next appointment:   2-3 month(s)  The format for your next appointment:   In Person  Provider:   Buford Dresser, MD{

## 2021-04-15 NOTE — Progress Notes (Signed)
Cardiology Office Note:    Date:  04/15/2021   ID:  Jasmine [name], DOB February 21, 1971, MRN [id number]  PCP:  Aletha Halim., PA-C  Cardiologist:  Buford Dresser, MD  Referring MD: Aletha Halim., PA-C   CC: follow up  History of Present Illness:    Jasmine [name] is a 50 y.o. female with a hx of severe OSA, chronic respiratory failure with hypoxia, hypertension, type I diabetes, hypothyroidism, CKD stage 5, prior tobacco use, rheumatoid arthritis who is seen for follow up today. I initially saw her 04/22/20 as a new consult at the request of Aletha Halim., PA-C for the evaluation and management of diastolic dysfunction.  Pertinent cards history: On my review, echo 04/11/2017 showed normal LV systolic and diastolic function. Normal RV. Normal IVC, RAP 3 mmHg. PASP 38 mmHg. Had a CPX 08/03/2017, notable for immediate desaturations with exercise to 87%, improved during recovery. Mild to moderate functional limitation, no cardiovascular limitation. There was chronotropic incompetence, with peak heart rate of 81 bpm.  Echo done at [facility] 03/20/20 notes normal LV systolic function, grade 1 diastolic dysfunction, normal RV. Pulm pressures not estimated due to insufficient TR jet. IVC not well visualized. E/A 0.8, E/e' 12.8. TR peak velocity noted at 3 m/s, which could be extrapolated to 36 mmHg. TAPSE normal.  Today: She is accompanied by a family member. Overall she has been feeling okay, but notes her blood pressure had been low and she had some dizziness. After carvedilol was cut in half, her dizziness resolved but her blood pressure has been higher. Usually her blood pressure was averaging in the 110s/70-80s, but at other times in clinic it has been as high as 200/100.  Lately she has been feeling short of breath "quite a bit." She was previously infected with Covid 04/2020.  She denies any palpitations, or chest pain. No headaches, syncope, orthopnea, PND, or lower extremity  edema.   Past Medical History:  Diagnosis Date   Anxiety    Arthritis    Asthma    Balance problems    Bipolar disorder ([place])    Charcot ankle    Chronic fatigue    Chronic kidney disease    STAGE 3-4   Depression    Diabetes mellitus    DKA, type 1 ([place]) 11/04/2011   Elevated cholesterol    Fibromyalgia    GERD (gastroesophageal reflux disease)    Headache    History of suicidal ideation    Hyperlipemia    Hypertension    Hypothyroidism    IBS (irritable bowel syndrome)    Memory changes    Obesity    Sleep apnea    HAS C -PAP / DOES NOT USE   Stress incontinence    Pt had surgery to correct this.   Tachycardia    Tobacco abuse    Tremor    UTI (lower urinary tract infection)     Past Surgical History:  Procedure Laterality Date   INCONTINENCE SURGERY     NASAL FRACTURE SURGERY     ovary removed     OVARY SURGERY     PUBOVAGINAL SLING  08/16/2011   Procedure: [name];  Surgeon: Bernestine Amass, MD;  Location: [facility];  Service: Urology;  Laterality: N/A;          UTERINE FIBROID SURGERY  2001    Current Medications: Current Outpatient Medications on File Prior to Visit  Medication Sig   APPLE CIDER VINEGAR PO Take 1  tablet by mouth daily.   aspirin EC 81 MG tablet Take 81 mg by mouth at bedtime.    buPROPion (WELLBUTRIN XL) 300 MG 24 hr tablet Take 1 tablet (300 mg total) by mouth daily.   carvedilol (COREG) 25 MG tablet Take 12.5 mg by mouth 2 (two) times daily with a meal.   Cholecalciferol 50 MCG (2000 UT) TABS Take 1 tablet by mouth daily.    Coenzyme Q10 (COQ-10 PO) Take 1 capsule by mouth daily.   Continuous Blood Gluc Sensor MISC 1 each by Does not apply route as directed. Use as directed every 14 days. May dispense FreeStyle [employer] or similar.   dicyclomine (BENTYL) 10 MG capsule Take 10 mg by mouth 3 (three) times daily as needed for spasms.   DULoxetine (CYMBALTA) 60 MG capsule Take 1 capsule (60 mg total) by mouth daily.    fluticasone (FLONASE) 50 MCG/ACT nasal spray Place 2 sprays into the nose daily as needed for allergies.    folic acid (FOLVITE) 485 MCG tablet Take 400 mcg by mouth every evening.    furosemide (LASIX) 20 MG tablet Take 20 mg by mouth daily.    gabapentin (NEURONTIN) 300 MG capsule Take 300 mg by mouth 3 (three) times daily.   glucagon (GLUCAGEN HYPOKIT) 1 MG SOLR injection GlucaGen HypoKit 1 mg Injection   insulin glargine (LANTUS) 100 UNIT/ML injection Inject 0.5 mLs (50 Units total) into the skin daily. (Patient taking differently: Inject 50 Units into the skin daily as needed (when pump is not working).)   lamoTRIgine (LAMICTAL) 200 MG tablet Take 1 tablet (200 mg total) by mouth daily.   levothyroxine (SYNTHROID, LEVOTHROID) 150 MCG tablet Take 150 mcg by mouth daily before breakfast.   [START ON 05/01/2021] LORazepam (ATIVAN) 0.5 MG tablet Take 1 tablet (0.5 mg total) by mouth daily as needed for anxiety.   NOVOLOG 100 UNIT/ML injection Inject 2.425-2.95 Units into the skin continuous. Via pump 7pm to midnight 2.950 8:30am -7pm2.925 Midnight - 8:30 2.425   Omega-3 Fatty Acids (FISH OIL PO) Take 1 capsule by mouth daily.   omeprazole (PRILOSEC) 20 MG capsule Take 20 mg by mouth at bedtime.    ondansetron (ZOFRAN ODT) 4 MG disintegrating tablet Take 1 tablet (4 mg total) by mouth every 8 (eight) hours as needed.   oxyCODONE (OXY IR/ROXICODONE) 5 MG immediate release tablet Take 5 mg by mouth 4 (four) times daily as needed.   pravastatin (PRAVACHOL) 80 MG tablet Take 80 mg by mouth at bedtime.   PROAIR HFA 108 (90 Base) MCG/ACT inhaler Inhale 1-2 puffs into the lungs every 6 (six) hours as needed for wheezing or shortness of breath.    psyllium (REGULOID) 0.52 g capsule Take 0.52 g by mouth in the morning and at bedtime.    simethicone (MYLICON) 80 MG chewable tablet Chew by mouth.   OXYGEN Inhale 3 L into the lungs as needed.   rizatriptan (MAXALT) 10 MG tablet Take 1 tablet (10 mg  total) by mouth as needed for migraine. May repeat in 2 hours if needed   No current facility-administered medications on file prior to visit.     Allergies:   Ciprofloxacin, Levaquin [levofloxacin], Buspar [buspirone], Linaclotide, Advair diskus [fluticasone-salmeterol], Biaxin [clarithromycin], and Hydroxyzine   Social History   Tobacco Use   Smoking status: Former    Packs/day: 0.75    Years: 20.00    Pack years: 15.00    Types: Cigarettes    Quit date: 06/08/2011  Years since quitting: 9.8   Smokeless tobacco: Never  Vaping Use   Vaping Use: Never used  Substance Use Topics   Alcohol use: No   Drug use: No    Family History: family history includes Asthma in her mother; Bipolar disorder in her mother; Bladder Cancer in her paternal grandfather; Cancer in her paternal grandmother; Diabetes in her father; Heart disease in her father; Hyperlipidemia in her father; Hypertension in her father; Lymphoma in her father; Suicidality in her maternal grandfather; Thyroid disease in her brother and father. Gena Fray had MI, Mat Gma had MI, father has enlarged heart.   ROS:   Please see the history of present illness.   (+) Shortness of breath. Additional pertinent ROS otherwise unremarkable.    EKGs/Labs/Other Studies Reviewed:    The following studies were reviewed today: See summary in HPI  EKG:  EKG is personally reviewed.   04/15/2021: NSR at 61 bpm, PRWP 04/22/20: nsr at 68 bpm  Recent Labs: No results found for requested labs within last 8760 hours.   Recent Lipid Panel    Component Value Date/Time   CHOL 163 08/31/2018 0632   TRIG 108 08/31/2018 0632   HDL 62 08/31/2018 0632   CHOLHDL 2.6 08/31/2018 0632   VLDL 22 08/31/2018 0632   LDLCALC 79 08/31/2018 0632    Physical Exam:    VS:  BP (!) 156/86 (BP Location: Right Arm, Patient Position: Sitting, Cuff Size: Large)   Pulse 61   Ht 5\' 9"  (1.753 m)   Wt (!) 322 lb 6.4 oz (146.2 kg)   LMP 03/23/2017  (Approximate)   SpO2 98%   BMI 47.61 kg/m     Wt Readings from Last 3 Encounters:  04/15/21 (!) 322 lb 6.4 oz (146.2 kg)  12/09/20 (!) 319 lb (144.7 kg)  05/27/20 (!) 325 lb 6.4 oz (147.6 kg)    GEN: Well nourished, well developed in no acute distress. In wheelchair. HEENT: Normal, moist mucous membranes NECK: No JVD at 90 degrees CARDIAC: regular rhythm, normal S1 and S2, no rubs or gallops. No murmur. VASCULAR: Radial and DP pulses 2+ bilaterally. No carotid bruits RESPIRATORY:  Clear to auscultation without rales, wheezing or rhonchi  ABDOMEN: Soft, non-tender, non-distended MUSCULOSKELETAL:  Ambulates independently SKIN: Warm and dry, no edema NEUROLOGIC:  Alert and oriented x 3. No focal neuro deficits noted. PSYCHIATRIC:  Normal affect   ASSESSMENT:    1. Primary hypertension   2. Type 1 diabetes mellitus with stage 4 chronic kidney disease ([place])   3. OSA (obstructive sleep apnea)   4. Cardiac risk counseling     PLAN:    Chronic respiratory failure with hypoxia, on O2 at night, with severe sleep apnea -this has been attributed to both chronic diastolic heart failure and pulmonary hypertension. However, I can find little evidence for these based on available data -we again discussed today signs/symptoms that would be concerning for right heart failure. She has no JVD, no swelling, and is saturating well on room air. No indication for RHC at this time.  Chronotropic incompetence -per CPX report -suspect this was due to high dose beta blocker. Asymptomatic at this time. Carvedilol has been decreased  Hypertension: -blood pressure has been rising since carvedilol decreased -currently taking carvedilol 12.5 mg BID, furosemide 20 mg daily -discussed options, limited due to CKD. Will start amlodipine 5 mg daily. Monitor LE edema, home blood pressures  Type I diabetes, with stage 4 chronic kidney disease -continue pravastatin, aspirin for  primary prevention -last A1c  7.6, Cr 1.98 per [facility]  Cardiac risk counseling and prevention recommendations: -recommend heart healthy/Mediterranean diet, with whole grains, fruits, vegetable, fish, lean meats, nuts, and olive oil. Limit salt. -recommend moderate walking, 3-5 times/week for 30-50 minutes each session. Aim for at least 150 minutes.week. Goal should be pace of 3 miles/hours, or walking 1.5 miles in 30 minutes -recommend avoidance of tobacco products. Avoid excess alcohol. -Additional risk factor control:  -Lipids: per [facility], last LDL 79  -Weight: BMI 47, morbid obesity -ASCVD risk score: The 10-year ASCVD risk score (Arnett DK, et al., 2019) is: 1.8%   Values used to calculate the score:     Age: 77 years     Sex: Female     Is Non-Hispanic African American: No     Diabetic: Yes     Tobacco smoker: No     Systolic Blood Pressure: 979 mmHg     Is BP treated: Yes     HDL Cholesterol: 94 MG/DL     Total Cholesterol: 176 MG/DL    Plan for follow up: 3 mos or sooner as needed  Buford Dresser, MD, PhD [place]  [facility]    Medication Adjustments/Labs and Tests Ordered: Current medicines are reviewed at length with the patient today.  Concerns regarding medicines are outlined above.   Orders Placed This Encounter  Procedures   EKG 12-Lead    Meds ordered this encounter  Medications   amLODipine (NORVASC) 5 MG tablet    Sig: Take 1 tablet (5 mg total) by mouth daily.    Dispense:  90 tablet    Refill:  3    Patient Instructions  Medication Instructions:  Start Amlodipine 5 mg daily  *If you need a refill on your cardiac medications before your next appointment, please call your pharmacy*   Lab Work: None ordered today   Testing/Procedures: None ordered today   Follow-Up: At [facility], you and your health needs are our priority.  As part of our continuing mission to provide you with exceptional heart care, we have created designated Provider Care Teams.  These  Care Teams include your primary Cardiologist (physician) and Advanced Practice Providers (APPs -  Physician Assistants and Nurse Practitioners) who all work together to provide you with the care you need, when you need it.  We recommend signing up for the patient portal called "MyChart".  Sign up information is provided on this After Visit Summary.  MyChart is used to connect with patients for Virtual Visits (Telemedicine).  Patients are able to view lab/test results, encounter notes, upcoming appointments, etc.  Non-urgent messages can be sent to your provider as well.   To learn more about what you can do with MyChart, go to [email or web address].    Your next appointment:   2-3 month(s)  The format for your next appointment:   In Person  Provider:   Buford Dresser, MD{       I,Mathew Stumpf,acting as a scribe for Buford Dresser, MD.,have documented all relevant documentation on the behalf of Buford Dresser, MD,as directed by  Buford Dresser, MD while in the presence of Buford Dresser, MD.  I, Buford Dresser, MD, have reviewed all documentation for this visit. The documentation on 04/15/21 for the exam, diagnosis, procedures, and orders are all accurate and complete.   Signed, Buford Dresser, MD PhD 04/15/2021 10:41 AM    [place]

## 2021-05-29 ENCOUNTER — Other Ambulatory Visit: Payer: Self-pay | Admitting: Psychiatry

## 2021-05-29 DIAGNOSIS — F3181 Bipolar II disorder: Secondary | ICD-10-CM

## 2021-06-01 ENCOUNTER — Other Ambulatory Visit: Payer: Self-pay | Admitting: Psychiatry

## 2021-06-01 DIAGNOSIS — F3181 Bipolar II disorder: Secondary | ICD-10-CM

## 2021-06-16 ENCOUNTER — Ambulatory Visit (HOSPITAL_BASED_OUTPATIENT_CLINIC_OR_DEPARTMENT_OTHER): Payer: Medicare Other | Admitting: Cardiology

## 2021-06-18 ENCOUNTER — Ambulatory Visit: Payer: BC Managed Care – PPO | Admitting: Neurology

## 2021-06-22 ENCOUNTER — Other Ambulatory Visit: Payer: Self-pay | Admitting: Psychiatry

## 2021-06-22 DIAGNOSIS — F3181 Bipolar II disorder: Secondary | ICD-10-CM
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
## 2021-06-29 ENCOUNTER — Encounter: Payer: Self-pay | Admitting: Psychiatry

## 2021-06-29 ENCOUNTER — Other Ambulatory Visit: Payer: Self-pay

## 2021-06-29 ENCOUNTER — Telehealth (INDEPENDENT_AMBULATORY_CARE_PROVIDER_SITE_OTHER): Payer: Medicare Other | Admitting: Psychiatry

## 2021-06-29 DIAGNOSIS — F411 Generalized anxiety disorder: Secondary | ICD-10-CM | POA: Diagnosis not present

## 2021-06-29 DIAGNOSIS — F3181 Bipolar II disorder: Secondary | ICD-10-CM

## 2021-06-29 MED ORDER — DESIPRAMINE HCL 25 MG PO TABS: 25.0000 mg | ORAL_TABLET | Freq: Every day | ORAL | 0 refills | Status: DC

## 2021-06-29 MED ORDER — BUPROPION HCL ER (XL) 300 MG PO TB24: 300.0000 mg | ORAL_TABLET | Freq: Every day | ORAL | 5 refills | Status: DC

## 2021-06-29 MED ORDER — LORAZEPAM 0.5 MG PO TABS: 0.5000 mg | ORAL_TABLET | Freq: Every day | ORAL | 2 refills | Status: DC | PRN

## 2021-06-29 NOTE — Patient Instructions (Signed)
Continue lamotrigine 200 mg daily Start desipramine 25 mg daily Continue duloxetine 60 mg daily  Continue bupropion 300 mg daily   Continue lorazepam 0.5 mg daily as needed for anxiety  Next appointment: 2/20 at 3:30

## 2021-07-06 ENCOUNTER — Telehealth: Payer: Self-pay

## 2021-07-06 ENCOUNTER — Ambulatory Visit (HOSPITAL_BASED_OUTPATIENT_CLINIC_OR_DEPARTMENT_OTHER): Payer: Medicare Other | Admitting: Cardiology

## 2021-07-06 ENCOUNTER — Encounter (HOSPITAL_BASED_OUTPATIENT_CLINIC_OR_DEPARTMENT_OTHER): Payer: Self-pay

## 2021-07-06 NOTE — Telephone Encounter (Signed)
I am sorry for her loss. Please remind the patient that desipramine was started at her last visit, and it would help for her mood such as depression/anxiety. It would take up to several weeks for the medication to be fully effective. Please advise her to stay on this medication to see how it helps unless she has any side effect. Please also advise her to continue to see her therapist. Please review emergency resources if needed.

## 2021-07-06 NOTE — Telephone Encounter (Signed)
pt called states that her father passed and that she is not handling it so good can you call in something to help medication she has is not enough.

## 2021-07-06 NOTE — Progress Notes (Incomplete)
Cardiology Office Note:    Date:  07/06/2021   ID:  Jasmine [name], DOB [birth year], MRN [id number]  PCP:  Jasmine [name]., PA-C  Cardiologist:  Jasmine Dresser, MD  Referring MD: Jasmine [name]., PA-C   CC: follow up  History of Present Illness:    Jasmine [name] is a 51 y.o. female with a hx of severe OSA, chronic respiratory failure with hypoxia, hypertension, type I diabetes, hypothyroidism, CKD stage 5, prior tobacco use, rheumatoid arthritis who is seen for follow up today. I initially saw her 04/22/20 as a new consult at the request of Jasmine [name]., PA-C for the evaluation and management of diastolic dysfunction.  Pertinent cards history: On my review, echo 04/11/2017 showed normal LV systolic and diastolic function. Normal RV. Normal IVC, RAP 3 mmHg. PASP 38 mmHg. Had a CPX 08/03/2017, notable for immediate desaturations with exercise to 87%, improved during recovery. Mild to moderate functional limitation, no cardiovascular limitation. There was chronotropic incompetence, with peak heart rate of 81 bpm.  Echo done at [facility] 03/20/20 notes normal LV systolic function, grade 1 diastolic dysfunction, normal RV. Pulm pressures not estimated due to insufficient TR jet. IVC not well visualized. E/A 0.8, E/e' 12.8. TR peak velocity noted at 3 m/s, which could be extrapolated to 36 mmHg. TAPSE normal.  Today: She is accompanied by a family member. Overall she has been feeling okay, but notes her blood pressure had been low and she had some dizziness. After carvedilol was cut in half, her dizziness resolved but her blood pressure has been higher. Usually her blood pressure was averaging in the 110s/70-80s, but at other times in clinic it has been as high as 200/100.  Lately she has been feeling short of breath "quite a bit." She was previously infected with Covid 04/2020.  Today: Overall,  She denies any palpitations, chest pain, or shortness of breath. No  lightheadedness, headaches, syncope, orthopnea, PND, lower extremity edema or exertional symptoms.  (+)   Past Medical History:  Diagnosis Date   Anxiety    Arthritis    Asthma    Balance problems    Bipolar disorder ([place])    Charcot ankle    Chronic fatigue    Chronic kidney disease    STAGE 3-4   Depression    Diabetes mellitus    DKA, type 1 ([place]) 11/04/2011   Elevated cholesterol    Fibromyalgia    GERD (gastroesophageal reflux disease)    Headache    History of suicidal ideation    Hyperlipemia    Hypertension    Hypothyroidism    IBS (irritable bowel syndrome)    Memory changes    Obesity    Sleep apnea    HAS C -PAP / DOES NOT USE   Stress incontinence    Pt had surgery to correct this.   Tachycardia    Tobacco abuse    Tremor    UTI (lower urinary tract infection)     Past Surgical History:  Procedure Laterality Date   INCONTINENCE SURGERY     NASAL FRACTURE SURGERY     ovary removed     OVARY SURGERY     PUBOVAGINAL SLING  08/16/2011   Procedure: Jasmine [name];  Surgeon: Bernestine Amass, MD;  Location: [facility];  Service: Urology;  Laterality: N/A;          UTERINE FIBROID SURGERY  2001    Current Medications: Current Outpatient Medications on File Prior to Visit  Medication Sig   amLODipine (NORVASC) 5 MG tablet Take 1 tablet (5 mg total) by mouth daily.   APPLE CIDER VINEGAR PO Take 1 tablet by mouth daily.   aspirin EC 81 MG tablet Take 81 mg by mouth at bedtime.    buPROPion (WELLBUTRIN XL) 300 MG 24 hr tablet Take 1 tablet (300 mg total) by mouth daily.   carvedilol (COREG) 25 MG tablet Take 12.5 mg by mouth 2 (two) times daily with a meal.   Cholecalciferol 50 MCG (2000 UT) TABS Take 1 tablet by mouth daily.    Coenzyme Q10 (COQ-10 PO) Take 1 capsule by mouth daily.   Continuous Blood Gluc Sensor MISC 1 each by Does not apply route as directed. Use as directed every 14 days. May dispense FreeStyle [employer] or similar.    desipramine (NORPRAMIN) 25 MG tablet Take 1 tablet (25 mg total) by mouth daily.   dicyclomine (BENTYL) 10 MG capsule Take 10 mg by mouth 3 (three) times daily as needed for spasms.   DULoxetine (CYMBALTA) 60 MG capsule Take 1 capsule (60 mg total) by mouth daily.   fluticasone (FLONASE) 50 MCG/ACT nasal spray Place 2 sprays into the nose daily as needed for allergies.    folic acid (FOLVITE) 025 MCG tablet Take 400 mcg by mouth every evening.    furosemide (LASIX) 20 MG tablet Take 20 mg by mouth daily.    gabapentin (NEURONTIN) 300 MG capsule Take 300 mg by mouth 3 (three) times daily.   glucagon (GLUCAGEN HYPOKIT) 1 MG SOLR injection GlucaGen HypoKit 1 mg Injection   insulin glargine (LANTUS) 100 UNIT/ML injection Inject 0.5 mLs (50 Units total) into the skin daily. (Patient taking differently: Inject 50 Units into the skin daily as needed (when pump is not working).)   lamoTRIgine (LAMICTAL) 200 MG tablet Take 1 tablet (200 mg total) by mouth daily.   levothyroxine (SYNTHROID, LEVOTHROID) 150 MCG tablet Take 150 mcg by mouth daily before breakfast.   LORazepam (ATIVAN) 0.5 MG tablet Take 1 tablet (0.5 mg total) by mouth daily as needed for anxiety.   NOVOLOG 100 UNIT/ML injection Inject 2.425-2.95 Units into the skin continuous. Via pump 7pm to midnight 2.950 8:30am -7pm2.925 Midnight - 8:30 2.425   Omega-3 Fatty Acids (FISH OIL PO) Take 1 capsule by mouth daily.   omeprazole (PRILOSEC) 20 MG capsule Take 20 mg by mouth at bedtime.    ondansetron (ZOFRAN ODT) 4 MG disintegrating tablet Take 1 tablet (4 mg total) by mouth every 8 (eight) hours as needed.   oxyCODONE (OXY IR/ROXICODONE) 5 MG immediate release tablet Take 5 mg by mouth 4 (four) times daily as needed.   OXYGEN Inhale 3 L into the lungs as needed.   pravastatin (PRAVACHOL) 80 MG tablet Take 80 mg by mouth at bedtime.   PROAIR HFA 108 (90 Base) MCG/ACT inhaler Inhale 1-2 puffs into the lungs every 6 (six) hours as needed for  wheezing or shortness of breath.    psyllium (REGULOID) 0.52 g capsule Take 0.52 g by mouth in the morning and at bedtime.    rizatriptan (MAXALT) 10 MG tablet Take 1 tablet (10 mg total) by mouth as needed for migraine. May repeat in 2 hours if needed   simethicone (MYLICON) 80 MG chewable tablet Chew by mouth.   No current facility-administered medications on file prior to visit.     Allergies:   Ciprofloxacin, Levaquin [levofloxacin], Buspar [buspirone], Linaclotide, Advair diskus [fluticasone-salmeterol], Biaxin [clarithromycin], and Hydroxyzine  Social History   Tobacco Use   Smoking status: Former    Packs/day: 0.75    Years: 20.00    Pack years: 15.00    Types: Cigarettes    Quit date: 06/08/2011    Years since quitting: 10.0   Smokeless tobacco: Never  Vaping Use   Vaping Use: Never used  Substance Use Topics   Alcohol use: No   Drug use: No    Family History: family history includes Asthma in her mother; Bipolar disorder in her mother; Bladder Cancer in her paternal grandfather; Cancer in her paternal grandmother; Diabetes in her father; Heart disease in her father; Hyperlipidemia in her father; Hypertension in her father; Lymphoma in her father; Suicidality in her maternal grandfather; Thyroid disease in her brother and father. Gena Fray had MI, Mat Gma had MI, father has enlarged heart.   ROS:   Please see the history of present illness.    Additional pertinent ROS otherwise unremarkable.    EKGs/Labs/Other Studies Reviewed:    The following studies were reviewed today: See summary in HPI  EKG:  EKG is personally reviewed.   07/06/2020: *** 04/15/2021: NSR at 61 bpm, PRWP 04/22/20: nsr at 68 bpm  Recent Labs: No results found for requested labs within last 8760 hours.   Recent Lipid Panel    Component Value Date/Time   CHOL 163 08/31/2018 0632   TRIG 108 08/31/2018 0632   HDL 62 08/31/2018 0632   CHOLHDL 2.6 08/31/2018 0632   VLDL 22 08/31/2018 0632    LDLCALC 79 08/31/2018 0632    Physical Exam:    VS:  LMP 03/23/2017 (Approximate)     Wt Readings from Last 3 Encounters:  04/15/21 (!) 322 lb 6.4 oz (146.2 kg)  12/09/20 (!) 319 lb (144.7 kg)  05/27/20 (!) 325 lb 6.4 oz (147.6 kg)    GEN: Well nourished, well developed in no acute distress. In wheelchair. HEENT: Normal, moist mucous membranes NECK: No JVD at 90 degrees*** CARDIAC: regular rhythm, normal S1 and S2, no rubs or gallops. No murmur. VASCULAR: Radial and DP pulses 2+ bilaterally. No carotid bruits RESPIRATORY:  Clear to auscultation without rales, wheezing or rhonchi  ABDOMEN: Soft, non-tender, non-distended MUSCULOSKELETAL:  Ambulates independently SKIN: Warm and dry, no edema NEUROLOGIC:  Alert and oriented x 3. No focal neuro deficits noted. PSYCHIATRIC:  Normal affect   ASSESSMENT:    No diagnosis found.   PLAN:    Chronic respiratory failure with hypoxia, on O2 at night, with severe sleep apnea -this has been attributed to both chronic diastolic heart failure and pulmonary hypertension. However, I can find little evidence for these based on available data -we again discussed today signs/symptoms that would be concerning for right heart failure. She has no JVD, no swelling, and is saturating well on room air. No indication for RHC at this time.  Chronotropic incompetence -per CPX report -suspect this was due to high dose beta blocker. Asymptomatic at this time. Carvedilol has been decreased  Hypertension: -blood pressure has been rising since carvedilol decreased -currently taking carvedilol 12.5 mg BID, furosemide 20 mg daily -discussed options, limited due to CKD. Will start amlodipine 5 mg daily. Monitor LE edema, home blood pressures  Type I diabetes, with stage 4 chronic kidney disease -continue pravastatin, aspirin for primary prevention -last A1c 7.6, Cr 1.98 per [facility]  Cardiac risk counseling and prevention recommendations: -recommend heart  healthy/Mediterranean diet, with whole grains, fruits, vegetable, fish, lean meats, nuts, and olive oil. Limit  salt. -recommend moderate walking, 3-5 times/week for 30-50 minutes each session. Aim for at least 150 minutes.week. Goal should be pace of 3 miles/hours, or walking 1.5 miles in 30 minutes -recommend avoidance of tobacco products. Avoid excess alcohol. -Additional risk factor control:  -Lipids: per [facility], last LDL 79  -Weight: BMI 47, morbid obesity -ASCVD risk score: The 10-year ASCVD risk score (Arnett DK, et al., 2019) is: 0.8%   Values used to calculate the score:     Age: 77 years     Sex: Female     Is Non-Hispanic African American: No     Diabetic: Yes     Tobacco smoker: No     Systolic Blood Pressure: 500 mmHg     Is BP treated: Yes     HDL Cholesterol: 94 MG/DL     Total Cholesterol: 176 MG/DL    Plan for follow up: ***3 mos or sooner as needed  Jasmine Dresser, MD, PhD [place]   [facility]    Medication Adjustments/Labs and Tests Ordered: Current medicines are reviewed at length with the patient today.  Concerns regarding medicines are outlined above.   No orders of the defined types were placed in this encounter.  No orders of the defined types were placed in this encounter.  There are no Patient Instructions on file for this visit.   I,Mathew Stumpf,acting as a Education administrator for [employer], MD.,have documented all relevant documentation on the behalf of Jasmine Dresser, MD,as directed by  Jasmine Dresser, MD while in the presence of Jasmine Dresser, MD.  I, Madelin Rear, have reviewed all documentation for this visit. The documentation on 07/06/21 for the exam, diagnosis, procedures, and orders are all accurate and complete.   Signed, Jasmine Dresser, MD PhD 07/06/2021 8:35 AM    [place]

## 2021-07-17 ENCOUNTER — Ambulatory Visit: Payer: Medicare Other | Admitting: Pulmonary Disease
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
## 2021-07-27 ENCOUNTER — Telehealth (INDEPENDENT_AMBULATORY_CARE_PROVIDER_SITE_OTHER): Payer: Medicare Other | Admitting: Psychiatry

## 2021-07-27 ENCOUNTER — Other Ambulatory Visit: Payer: Self-pay

## 2021-07-27 ENCOUNTER — Encounter: Payer: Self-pay | Admitting: Psychiatry

## 2021-07-27 DIAGNOSIS — F3181 Bipolar II disorder: Secondary | ICD-10-CM

## 2021-07-27 DIAGNOSIS — F419 Anxiety disorder, unspecified: Secondary | ICD-10-CM | POA: Diagnosis not present

## 2021-07-27 MED ORDER — DESIPRAMINE HCL 25 MG PO TABS: 25.0000 mg | ORAL_TABLET | Freq: Every day | ORAL | 0 refills | Status: DC

## 2021-07-27 NOTE — Patient Instructions (Signed)
Continue lamotrigine 200 mg daily Continue desipramine 25 mg daily Continue duloxetine 60 mg daily Continue bupropion 300 mg daily   Continue lorazepam 0.5 mg daily as needed for anxiety  Next appointment: 3/24 at 10 AM

## 2021-07-30 ENCOUNTER — Other Ambulatory Visit: Payer: Self-pay | Admitting: Psychiatry

## 2021-08-11 ENCOUNTER — Telehealth: Payer: Self-pay
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
## 2021-08-11 NOTE — Telephone Encounter (Signed)
No. I am not able to do that for him.

## 2021-08-17 NOTE — Telephone Encounter (Signed)
Pt was already called and told this info.  ?

## 2021-08-25 NOTE — Progress Notes (Signed)
Virtual Visit via Video Note ? ?I connected with Jasmine [name] on 08/28/21 at 10:00 AM EDT by a video enabled telemedicine application and verified that I am speaking with the correct person using two identifiers. ? ?Location: ?Patient: home ?Provider: office ?Persons participated in the visit- patient, provider  ?  ?I discussed the limitations of evaluation and management by telemedicine and the availability of in person appointments. The patient expressed understanding and agreed to proceed. ?I discussed the assessment and treatment plan with the patient. The patient was provided an opportunity to ask questions and all were answered. The patient agreed with the plan and demonstrated an understanding of the instructions. ?  ?The patient was advised to call back or seek an in-person evaluation if the symptoms worsen or if the condition fails to improve as anticipated. ? ?I provided 20 minutes of non-face-to-face time during this encounter. ? ? ?Norman Clay, MD ? ? ? ?BH MD/PA/NP OP Progress Note ? ?08/28/2021 10:36 AM ?Jasmine [name]  ?MRN:  [id number] ? ?Chief Complaint:  ?Chief Complaint  ?Patient presents with  ? Follow-up  ? Other  ? ?HPI:  ?This is a follow-up appointment for bipolar disorder and an anxiety.  ?She states that she continues to feel depressed.  She misses her father.  She also cried when she watched video of her parents.  She tends to stay in the house.  She also feels dizzy, and she has an upcoming appointment with her neurologist.  She also thinks that her legs has been weak since she had injury. Her pattern aunt calls her once a week to check in with her.  She agrees to be at the present moment/maintaining connection with who she has while validating her loss.  She states that the relationship with her husband has not been changed.  She was told by her husband to think about nursing home, although she is unsure if he was making a joke.  She felt upset by this.  She has been trying to do  bike paddle.  She also tries to take a walk when she is able.  Although she continues to feel anxious, it has been better, and she has been trying to manage it.  She has fair sleep.  She had weight gain since being started on desipramine; this subsided after she discontinued this medication.  She denies SI.  She takes lorazepam at night for anxiety.  She denies alcohol use or drug use.  She feels comfortable to stay on the current medication regimen at this time.  ? ? ? ?Visit Diagnosis:  ?  ICD-10-CM   ?1. Anxiety state  F41.1   ?  ?2. Bipolar 2 disorder, major depressive episode (HCC)  F31.81 DULoxetine (CYMBALTA) 60 MG capsule  ?  lamoTRIgine (LAMICTAL) 200 MG tablet  ?  ? ? ?Past Psychiatric History: Please see initial evaluation for full details. I have reviewed the history. No updates at this time.  ?  ? ?Past Medical History:  ?Past Medical History:  ?Diagnosis Date  ? Anxiety   ? Arthritis   ? Asthma   ? Balance problems   ? Bipolar disorder ([place])   ? Charcot ankle   ? Chronic fatigue   ? 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 Depression   ? Diabetes mellitus   ? DKA, type 1 ([place]) 11/04/2011  ? Elevated cholesterol   ? Fibromyalgia   ? GERD (gastroesophageal reflux disease)   ? Headache   ?  History of suicidal ideation   ? Hyperlipemia   ? Hypertension   ? Hypothyroidism   ? IBS (irritable bowel syndrome)   ? Memory changes   ? Obesity   ? Sleep apnea   ? HAS C -PAP / DOES NOT USE  ? Stress incontinence   ? Pt had surgery to correct this.  ? Tachycardia   ? Tobacco abuse   ? Tremor   ? UTI (lower urinary tract infection)   ?  ?Past Surgical History:  ?Procedure Laterality Date  ? INCONTINENCE SURGERY    ? NASAL FRACTURE SURGERY    ? ovary removed    ? OVARY SURGERY    ? PUBOVAGINAL SLING  08/16/2011  ? Procedure: [name];  Surgeon: Bernestine Amass, MD;  Location: [facility];  Service: Urology;  Laterality: N/A;   ? ? ? ? ?  ? [place]  2001  ? ? ?Family Psychiatric History: Please see  initial evaluation for full details. I have reviewed the history. No updates at this time.  ?  ? ?Family History:  ?Family History  ?Problem Relation Age of Onset  ? Asthma Mother   ? Bipolar disorder Mother   ? Heart disease Father   ? Lymphoma Father   ? Hypertension Father   ? Thyroid disease Father   ? Hyperlipidemia Father   ? Diabetes Father   ? Cancer Paternal Grandmother   ?     lung and breast  ? Bladder Cancer Paternal Grandfather   ? Suicidality Maternal Grandfather   ? Thyroid disease Brother   ? ? ?Social History:  ?Social History  ? ?Socioeconomic History  ? Marital status: Married  ?  Spouse name: Not on file  ? Number of children: 0  ? Years of education: 66  ? Highest education level: Not on file  ?Occupational History  ? Occupation: Disabled  ?Tobacco Use  ? Smoking status: Former  ?  Packs/day: 0.75  ?  Years: 20.00  ?  Pack years: 15.00  ?  Types: Cigarettes  ?  Quit date: 06/08/2011  ?  Years since quitting: 10.2  ? Smokeless tobacco: Never  ?Vaping Use  ? Vaping Use: Never used  ?Substance and Sexual Activity  ? Alcohol use: No  ? Drug use: No  ? Sexual activity: Yes  ?  Birth control/protection: Post-menopausal  ?Other Topics Concern  ? Not on file  ?Social History Narrative  ? Lives at home with husband.  ? Right-handed.  ? Occasional caffeine use.  ? ?Social Determinants of Health  ? ?Financial Resource Strain: Not on file  ?Food Insecurity: Not on file  ?Transportation Needs: Not on file  ?Physical Activity: Not on file  ?Stress: Not on file  ?Social Connections: Not on file  ? ? ?Allergies:  ?Allergies  ?Allergen Reactions  ? Ciprofloxacin Swelling and Other (See Comments)  ?  Per pt caused lips swell and nauseous feeling  ? Levaquin [Levofloxacin] Swelling and Other (See Comments)  ?  Per pt caused lips swell and nauseous feeling  ? Buspar [Buspirone] Other (See Comments)  ?  abd cramping  ? Linaclotide Other (See Comments)  ? Advair Diskus [Fluticasone-Salmeterol] Other (See Comments)  ?   Thrush   ? Biaxin [Clarithromycin] Rash  ? Hydroxyzine Palpitations  ? ? ?Metabolic Disorder Labs: ?Lab Results  ?Component Value Date  ? HGBA1C 7.6 (H) 10/01/2019  ? MPG 182.9 08/31/2018  ? MPG 229 (H) 12/25/2012  ? ?No results  found for: PROLACTIN ?Lab Results  ?Component Value Date  ? CHOL 163 08/31/2018  ? TRIG 108 08/31/2018  ? HDL 62 08/31/2018  ? CHOLHDL 2.6 08/31/2018  ? VLDL 22 08/31/2018  ? [place] 79 08/31/2018  ? ?Lab Results  ?Component Value Date  ? TSH 4.710 (H) 10/01/2019  ? TSH 0.879 08/31/2018  ? ? ?Therapeutic Level Labs: ?No results found for: LITHIUM ?No results found for: VALPROATE ?No components found for:  CBMZ ? ?Current Medications: ?Current Outpatient Medications  ?Medication Sig Dispense Refill  ? amLODipine (NORVASC) 5 MG tablet Take 1 tablet (5 mg total) by mouth daily. 90 tablet 3  ? APPLE CIDER VINEGAR PO Take 1 tablet by mouth daily.    ? aspirin EC 81 MG tablet Take 81 mg by mouth at bedtime.     ? buPROPion (WELLBUTRIN XL) 300 MG 24 hr tablet Take 1 tablet (300 mg total) by mouth daily. 30 tablet 5  ? carvedilol (COREG) 25 MG tablet Take 12.5 mg by mouth 2 (two) times daily with a meal.    ? Cholecalciferol 50 MCG (2000 UT) TABS Take 1 tablet by mouth daily.     ? Coenzyme Q10 (COQ-10 PO) Take 1 capsule by mouth daily.    ? Continuous Blood Gluc Sensor MISC 1 each by Does not apply route as directed. Use as directed every 14 days. May dispense FreeStyle [employer] or similar.    ? desipramine (NORPRAMIN) 25 MG tablet Take 1 tablet (25 mg total) by mouth daily. 30 tablet 0  ? dicyclomine (BENTYL) 10 MG capsule Take 10 mg by mouth 3 (three) times daily as needed for spasms.    ? [START ON 09/27/2021] DULoxetine (CYMBALTA) 60 MG capsule Take 1 capsule (60 mg total) by mouth daily. 30 capsule 3  ? fluticasone (FLONASE) 50 MCG/ACT nasal spray Place 2 sprays into the nose daily as needed for allergies.     ? folic acid (FOLVITE) 338 MCG tablet Take 400 mcg by mouth every evening.      ? furosemide (LASIX) 20 MG tablet Take 20 mg by mouth daily.     ? gabapentin (NEURONTIN) 300 MG capsule Take 300 mg by mouth 3 (three) times daily.    ? glucagon (GLUCAGEN HYPOKIT) 1 MG SOLR inject

## 2021-08-28 ENCOUNTER — Other Ambulatory Visit: Payer: Self-pay | Admitting: Psychiatry

## 2021-08-28 ENCOUNTER — Encounter: Payer: Self-pay | Admitting: Psychiatry

## 2021-08-28 ENCOUNTER — Other Ambulatory Visit: Payer: Self-pay

## 2021-08-28 ENCOUNTER — Telehealth (INDEPENDENT_AMBULATORY_CARE_PROVIDER_SITE_OTHER): Payer: BLUE CROSS/BLUE SHIELD | Admitting: Psychiatry

## 2021-08-28 DIAGNOSIS — F3181 Bipolar II disorder: Secondary | ICD-10-CM | POA: Diagnosis not present

## 2021-08-28 DIAGNOSIS — F411 Generalized anxiety disorder: Secondary | ICD-10-CM

## 2021-08-28 MED ORDER — DULOXETINE HCL 60 MG PO CPEP: 60.0000 mg | ORAL_CAPSULE | Freq: Every day | ORAL | 3 refills | Status: DC

## 2021-08-28 MED ORDER — LAMOTRIGINE 200 MG PO TABS: 200.0000 mg | ORAL_TABLET | Freq: Every day | ORAL | 3 refills | Status: DC

## 2021-08-28 NOTE — Patient Instructions (Signed)
Continue lamotrigine 200 mg daily ?Hold desipramine  ?Continue duloxetine 60 mg daily  ?Continue bupropion 300 mg daily ?Continue lorazepam 0.5 mg daily as needed for anxiety ? Next appointment: 5/18 at 3 PM ?

## 2021-08-31 ENCOUNTER — Ambulatory Visit (HOSPITAL_BASED_OUTPATIENT_CLINIC_OR_DEPARTMENT_OTHER): Payer: Medicare Other | Admitting: Cardiology

## 2021-08-31 NOTE — Progress Notes (Incomplete)
?Cardiology Office Note:   ? ?Date:  08/31/2021  ? ?ID:  Jasmine [name], DOB 07-20-1970, MRN [id number] ? ?PCP:  Aletha Halim., PA-C  ?Cardiologist:  Buford Dresser, MD ? ?Referring MD: Aletha Halim., PA-C  ? ?CC: follow up ? ?History of Present Illness:   ? ?Jasmine [name] is a 51 y.o. female with a hx of severe OSA, chronic respiratory failure with hypoxia, hypertension, type I diabetes, hypothyroidism, CKD stage 5, prior tobacco use, rheumatoid arthritis who is seen for follow up today. I initially saw her 04/22/20 as a new consult at the request of Aletha Halim., PA-C for the evaluation and management of diastolic dysfunction. ? ?Pertinent cards history: On my review, echo 04/11/2017 showed normal LV systolic and diastolic function. Normal RV. Normal IVC, RAP 3 mmHg. PASP 38 mmHg. Had a CPX 08/03/2017, notable for immediate desaturations with exercise to 87%, improved during recovery. Mild to moderate functional limitation, no cardiovascular limitation. There was chronotropic incompetence, with peak heart rate of 81 bpm.  Echo done at [facility] 03/20/20 notes normal LV systolic function, grade 1 diastolic dysfunction, normal RV. Pulm pressures not estimated due to insufficient TR jet. IVC not well visualized. E/A 0.8, E/e' 12.8. TR peak velocity noted at 3 m/s, which could be extrapolated to 36 mmHg. TAPSE normal. ? ?Today: ?She is accompanied by a family member. Overall she has been feeling okay, but notes her blood pressure had been low and she had some dizziness. After carvedilol was cut in half, her dizziness resolved but her blood pressure has been higher. Usually her blood pressure was averaging in the 110s/70-80s, but at other times in clinic it has been as high as 200/100. ? ?Lately she has been feeling short of breath "quite a bit." She was previously infected with Covid 04/2020. ? ?Today: ?Overall, ? ?She denies any palpitations, chest pain, shortness of breath, or peripheral  edema. No lightheadedness, headaches, syncope, orthopnea, or PND. ? ?(+) ? ? ?Past Medical History:  ?Diagnosis Date  ? Anxiety   ? Arthritis   ? Asthma   ? Balance problems   ? Bipolar disorder ([place])   ? Charcot ankle   ? Chronic fatigue   ? 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 Depression   ? Diabetes mellitus   ? DKA, type 1 ([place]) 11/04/2011  ? Elevated cholesterol   ? Fibromyalgia   ? GERD (gastroesophageal reflux disease)   ? Headache   ? History of suicidal ideation   ? Hyperlipemia   ? Hypertension   ? Hypothyroidism   ? IBS (irritable bowel syndrome)   ? Memory changes   ? Obesity   ? Sleep apnea   ? HAS C -PAP / DOES NOT USE  ? Stress incontinence   ? Pt had surgery to correct this.  ? Tachycardia   ? Tobacco abuse   ? Tremor   ? UTI (lower urinary tract infection)   ? ? ?Past Surgical History:  ?Procedure Laterality Date  ? INCONTINENCE SURGERY    ? NASAL FRACTURE SURGERY    ? ovary removed    ? OVARY SURGERY    ? PUBOVAGINAL SLING  08/16/2011  ? Procedure: [name];  Surgeon: Bernestine Amass, MD;  Location: [facility];  Service: Urology;  Laterality: N/A;   ? ? ? ? ?  ? [place]  2001  ? ? ?Current Medications: ?Current Outpatient Medications on File Prior to Visit  ?Medication Sig  ?  amLODipine (NORVASC) 5 MG tablet Take 1 tablet (5 mg total) by mouth daily.  ? APPLE CIDER VINEGAR PO Take 1 tablet by mouth daily.  ? aspirin EC 81 MG tablet Take 81 mg by mouth at bedtime.   ? buPROPion (WELLBUTRIN XL) 300 MG 24 hr tablet Take 1 tablet (300 mg total) by mouth daily.  ? carvedilol (COREG) 25 MG tablet Take 12.5 mg by mouth 2 (two) times daily with a meal.  ? Cholecalciferol 50 MCG (2000 UT) TABS Take 1 tablet by mouth daily.   ? Coenzyme Q10 (COQ-10 PO) Take 1 capsule by mouth daily.  ? Continuous Blood Gluc Sensor MISC 1 each by Does not apply route as directed. Use as directed every 14 days. May dispense FreeStyle [employer] or similar.  ? desipramine (NORPRAMIN) 25 MG tablet  Take 1 tablet (25 mg total) by mouth daily.  ? dicyclomine (BENTYL) 10 MG capsule Take 10 mg by mouth 3 (three) times daily as needed for spasms.  ? [START ON 09/27/2021] DULoxetine (CYMBALTA) 60 MG capsule Take 1 capsule (60 mg total) by mouth daily.  ? fluticasone (FLONASE) 50 MCG/ACT nasal spray Place 2 sprays into the nose daily as needed for allergies.   ? folic acid (FOLVITE) 027 MCG tablet Take 400 mcg by mouth every evening.   ? furosemide (LASIX) 20 MG tablet Take 20 mg by mouth daily.   ? gabapentin (NEURONTIN) 300 MG capsule Take 300 mg by mouth 3 (three) times daily.  ? glucagon (GLUCAGEN HYPOKIT) 1 MG SOLR injection GlucaGen HypoKit 1 mg Injection  ? insulin glargine (LANTUS) 100 UNIT/ML injection Inject 0.5 mLs (50 Units total) into the skin daily. (Patient taking differently: Inject 50 Units into the skin daily as needed (when pump is not working).)  ? [START ON 09/27/2021] lamoTRIgine (LAMICTAL) 200 MG tablet Take 1 tablet (200 mg total) by mouth daily.  ? levothyroxine (SYNTHROID, LEVOTHROID) 150 MCG tablet Take 150 mcg by mouth daily before breakfast.  ? LORazepam (ATIVAN) 0.5 MG tablet Take 1 tablet (0.5 mg total) by mouth daily as needed for anxiety.  ? NOVOLOG 100 UNIT/ML injection Inject 2.425-2.95 Units into the skin continuous. Via pump ?7pm to midnight 2.950 ?8:30am -7pm2.925 ?Midnight - 8:30 2.425  ? Omega-3 Fatty Acids (FISH OIL PO) Take 1 capsule by mouth daily.  ? omeprazole (PRILOSEC) 20 MG capsule Take 20 mg by mouth at bedtime.   ? ondansetron (ZOFRAN ODT) 4 MG disintegrating tablet Take 1 tablet (4 mg total) by mouth every 8 (eight) hours as needed.  ? oxyCODONE (OXY IR/ROXICODONE) 5 MG immediate release tablet Take 5 mg by mouth 4 (four) times daily as needed.  ? OXYGEN Inhale 3 L into the lungs as needed.  ? pravastatin (PRAVACHOL) 80 MG tablet Take 80 mg by mouth at bedtime.  ? PROAIR HFA 108 (90 Base) MCG/ACT inhaler Inhale 1-2 puffs into the lungs every 6 (six) hours as needed for  wheezing or shortness of breath.   ? psyllium (REGULOID) 0.52 g capsule Take 0.52 g by mouth in the morning and at bedtime.   ? rizatriptan (MAXALT) 10 MG tablet Take 1 tablet (10 mg total) by mouth as needed for migraine. May repeat in 2 hours if needed  ? simethicone (MYLICON) 80 MG chewable tablet Chew by mouth.  ? ?No current facility-administered medications on file prior to visit.  ?  ? ?Allergies:   Ciprofloxacin, Levaquin [levofloxacin], Buspar [buspirone], Linaclotide, Advair diskus [fluticasone-salmeterol], Biaxin [clarithromycin], and  Hydroxyzine  ? ?Social History  ? ?Tobacco Use  ? Smoking status: Former  ?  Packs/day: 0.75  ?  Years: 20.00  ?  Pack years: 15.00  ?  Types: Cigarettes  ?  Quit date: 06/08/2011  ?  Years since quitting: 10.2  ? Smokeless tobacco: Never  ?Vaping Use  ? Vaping Use: Never used  ?Substance Use Topics  ? Alcohol use: No  ? Drug use: No  ? ? ?Family History: ?family history includes Asthma in her mother; Bipolar disorder in her mother; Bladder Cancer in her paternal grandfather; Cancer in her paternal grandmother; Diabetes in her father; Heart disease in her father; Hyperlipidemia in her father; Hypertension in her father; Lymphoma in her father; Suicidality in her maternal grandfather; Thyroid disease in her brother and father. Gena Fray had MI, Mat Gma had MI, father has enlarged heart.  ? ?ROS:   ?Please see the history of present illness.   ? ?Additional pertinent ROS otherwise unremarkable.  ? ? ?EKGs/Labs/Other Studies Reviewed:   ? ?The following studies were reviewed today: ?See summary in HPI ? ?EKG:  EKG is personally reviewed.   ?08/31/2021: EKG was not ordered. ?04/15/2021: NSR at 61 bpm, PRWP ?04/22/20: nsr at 68 bpm ? ?Recent Labs: ?No results found for requested labs within last 8760 hours.  ? ?Recent Lipid Panel ?   ?Component Value Date/Time  ? CHOL 163 08/31/2018 0632  ? TRIG 108 08/31/2018 0632  ? HDL 62 08/31/2018 0632  ? CHOLHDL 2.6 08/31/2018 7680  ? VLDL 22  08/31/2018 0632  ? [place] 79 08/31/2018 0632  ? ? ?Physical Exam:   ? ?VS:  LMP 03/23/2017 (Approximate)    ? ?Wt Readings from Last 3 Encounters:  ?04/15/21 (!) 322 lb 6.4 oz (146.2 kg)  ?12/09/20 (!) 319

## 2021-09-01 ENCOUNTER — Ambulatory Visit: Payer: Medicare Other | Admitting: Neurology

## 2021-09-04 ENCOUNTER — Ambulatory Visit (HOSPITAL_BASED_OUTPATIENT_CLINIC_OR_DEPARTMENT_OTHER): Payer: Medicare Other | Admitting: Cardiology

## 2021-09-08 ENCOUNTER — Other Ambulatory Visit: Payer: Self-pay

## 2021-09-08 ENCOUNTER — Emergency Department (HOSPITAL_COMMUNITY): Payer: BC Managed Care – PPO

## 2021-09-08 ENCOUNTER — Encounter (HOSPITAL_COMMUNITY): Payer: Self-pay

## 2021-09-08 ENCOUNTER — Inpatient Hospital Stay (HOSPITAL_COMMUNITY)
Admission: EM | Admit: 2021-09-08 | Discharge: 2021-09-12 | DRG: 637 | Disposition: A | Discharge status: Home / Self Care | Payer: BC Managed Care – PPO | Attending: Internal Medicine | Admitting: Internal Medicine

## 2021-09-08 DIAGNOSIS — E869 Volume depletion, unspecified: Secondary | ICD-10-CM | POA: Diagnosis present

## 2021-09-08 DIAGNOSIS — M1990 Unspecified osteoarthritis, unspecified site: Secondary | ICD-10-CM | POA: Diagnosis present

## 2021-09-08 DIAGNOSIS — Z881 Allergy status to other antibiotic agents status: Secondary | ICD-10-CM

## 2021-09-08 DIAGNOSIS — E66813 Obesity, class 3: Secondary | ICD-10-CM

## 2021-09-08 DIAGNOSIS — R519 Headache, unspecified: Secondary | ICD-10-CM

## 2021-09-08 DIAGNOSIS — Z87891 Personal history of nicotine dependence: Secondary | ICD-10-CM

## 2021-09-08 DIAGNOSIS — I160 Hypertensive urgency: Secondary | ICD-10-CM | POA: Diagnosis present

## 2021-09-08 DIAGNOSIS — E871 Hypo-osmolality and hyponatremia: Secondary | ICD-10-CM | POA: Diagnosis present

## 2021-09-08 DIAGNOSIS — Z794 Long term (current) use of insulin: Secondary | ICD-10-CM

## 2021-09-08 DIAGNOSIS — G4733 Obstructive sleep apnea (adult) (pediatric): Secondary | ICD-10-CM | POA: Diagnosis present

## 2021-09-08 DIAGNOSIS — R42 Dizziness and giddiness: Principal | ICD-10-CM

## 2021-09-08 DIAGNOSIS — M797 Fibromyalgia: Secondary | ICD-10-CM | POA: Diagnosis present

## 2021-09-08 DIAGNOSIS — Z8052 Family history of malignant neoplasm of bladder: Secondary | ICD-10-CM

## 2021-09-08 DIAGNOSIS — Z888 Allergy status to other drugs, medicaments and biological substances status: Secondary | ICD-10-CM

## 2021-09-08 DIAGNOSIS — Z825 Family history of asthma and other chronic lower respiratory diseases: Secondary | ICD-10-CM

## 2021-09-08 DIAGNOSIS — Z79899 Other long term (current) drug therapy: Secondary | ICD-10-CM

## 2021-09-08 DIAGNOSIS — N185 Chronic kidney disease, stage 5: Secondary | ICD-10-CM | POA: Diagnosis present

## 2021-09-08 DIAGNOSIS — Z807 Family history of other malignant neoplasms of lymphoid, hematopoietic and related tissues: Secondary | ICD-10-CM

## 2021-09-08 DIAGNOSIS — Z818 Family history of other mental and behavioral disorders: Secondary | ICD-10-CM

## 2021-09-08 DIAGNOSIS — I120 Hypertensive chronic kidney disease with stage 5 chronic kidney disease or end stage renal disease: Secondary | ICD-10-CM | POA: Diagnosis present

## 2021-09-08 DIAGNOSIS — R7989 Other specified abnormal findings of blood chemistry: Secondary | ICD-10-CM

## 2021-09-08 DIAGNOSIS — E7849 Other hyperlipidemia: Secondary | ICD-10-CM

## 2021-09-08 DIAGNOSIS — E782 Mixed hyperlipidemia: Secondary | ICD-10-CM | POA: Diagnosis present

## 2021-09-08 DIAGNOSIS — E1110 Type 2 diabetes mellitus with ketoacidosis without coma: Secondary | ICD-10-CM

## 2021-09-08 DIAGNOSIS — I248 Other forms of acute ischemic heart disease: Secondary | ICD-10-CM | POA: Diagnosis not present

## 2021-09-08 DIAGNOSIS — I10 Essential (primary) hypertension: Secondary | ICD-10-CM | POA: Diagnosis present

## 2021-09-08 DIAGNOSIS — R778 Other specified abnormalities of plasma proteins: Secondary | ICD-10-CM

## 2021-09-08 DIAGNOSIS — N179 Acute kidney failure, unspecified: Secondary | ICD-10-CM

## 2021-09-08 DIAGNOSIS — N184 Chronic kidney disease, stage 4 (severe): Secondary | ICD-10-CM

## 2021-09-08 DIAGNOSIS — G9341 Metabolic encephalopathy: Secondary | ICD-10-CM

## 2021-09-08 DIAGNOSIS — E1165 Type 2 diabetes mellitus with hyperglycemia: Secondary | ICD-10-CM

## 2021-09-08 DIAGNOSIS — Z20822 Contact with and (suspected) exposure to covid-19: Secondary | ICD-10-CM | POA: Diagnosis present

## 2021-09-08 DIAGNOSIS — R110 Nausea: Secondary | ICD-10-CM | POA: Diagnosis not present

## 2021-09-08 DIAGNOSIS — G43709 Chronic migraine without aura, not intractable, without status migrainosus: Secondary | ICD-10-CM | POA: Diagnosis present

## 2021-09-08 DIAGNOSIS — E039 Hypothyroidism, unspecified: Secondary | ICD-10-CM | POA: Diagnosis present

## 2021-09-08 DIAGNOSIS — Z8349 Family history of other endocrine, nutritional and metabolic diseases: Secondary | ICD-10-CM

## 2021-09-08 DIAGNOSIS — Z7982 Long term (current) use of aspirin: Secondary | ICD-10-CM

## 2021-09-08 DIAGNOSIS — Z833 Family history of diabetes mellitus: Secondary | ICD-10-CM

## 2021-09-08 DIAGNOSIS — I169 Hypertensive crisis, unspecified: Secondary | ICD-10-CM | POA: Diagnosis present

## 2021-09-08 DIAGNOSIS — Z79890 Hormone replacement therapy (postmenopausal): Secondary | ICD-10-CM

## 2021-09-08 DIAGNOSIS — Z83438 Family history of other disorder of lipoprotein metabolism and other lipidemia: Secondary | ICD-10-CM

## 2021-09-08 DIAGNOSIS — E1010 Type 1 diabetes mellitus with ketoacidosis without coma: Secondary | ICD-10-CM | POA: Diagnosis not present

## 2021-09-08 DIAGNOSIS — Z8249 Family history of ischemic heart disease and other diseases of the circulatory system: Secondary | ICD-10-CM

## 2021-09-08 DIAGNOSIS — Z6841 Body Mass Index (BMI) 40.0 and over, adult: Secondary | ICD-10-CM

## 2021-09-08 DIAGNOSIS — F3181 Bipolar II disorder: Secondary | ICD-10-CM | POA: Diagnosis present

## 2021-09-08 DIAGNOSIS — K219 Gastro-esophageal reflux disease without esophagitis: Secondary | ICD-10-CM

## 2021-09-08 LAB — CBC WITH DIFFERENTIAL/PLATELET
Abs Immature Granulocytes: 0.06 10*3/uL (ref 0.00–0.07)
Basophils Absolute: 0.1 10*3/uL (ref 0.0–0.1)
Basophils Relative: 1 %
Eosinophils Absolute: 0.4 10*3/uL (ref 0.0–0.5)
Eosinophils Relative: 4 %
HCT: 43.5 % (ref 36.0–46.0)
Hemoglobin: 13.8 g/dL (ref 12.0–15.0)
Immature Granulocytes: 1 %
Lymphocytes Relative: 17 %
Lymphs Abs: 1.6 10*3/uL (ref 0.7–4.0)
MCH: 27.8 pg (ref 26.0–34.0)
MCHC: 31.7 g/dL (ref 30.0–36.0)
MCV: 87.7 fL (ref 80.0–100.0)
Monocytes Absolute: 0.8 10*3/uL (ref 0.1–1.0)
Monocytes Relative: 9 %
Neutro Abs: 6.5 10*3/uL (ref 1.7–7.7)
Neutrophils Relative %: 68 %
Platelets: 297 10*3/uL (ref 150–400)
RBC: 4.96 MIL/uL (ref 3.87–5.11)
RDW: 14.3 % (ref 11.5–15.5)
WBC: 9.5 10*3/uL (ref 4.0–10.5)
nRBC: 0 % (ref 0.0–0.2)

## 2021-09-08 LAB — COMPREHENSIVE METABOLIC PANEL
ALT: 13 U/L (ref 0–44)
AST: 16 U/L (ref 15–41)
Albumin: 3.2 g/dL — ABNORMAL LOW (ref 3.5–5.0)
Alkaline Phosphatase: 99 U/L (ref 38–126)
Anion gap: 10 (ref 5–15)
BUN: 26 mg/dL — ABNORMAL HIGH (ref 6–20)
CO2: 26 mmol/L (ref 22–32)
Calcium: 11.4 mg/dL — ABNORMAL HIGH (ref 8.9–10.3)
Chloride: 100 mmol/L (ref 98–111)
Creatinine, Ser: 3.33 mg/dL — ABNORMAL HIGH (ref 0.44–1.00)
GFR, Estimated: 16 mL/min — ABNORMAL LOW (ref >60)
Glucose, Bld: 217 mg/dL — ABNORMAL HIGH (ref 70–99)
Potassium: 4.1 mmol/L (ref 3.5–5.1)
Sodium: 136 mmol/L (ref 135–145)
Total Bilirubin: 0.5 mg/dL (ref 0.3–1.2)
Total Protein: 7 g/dL (ref 6.5–8.1)

## 2021-09-08 LAB — URINALYSIS, ROUTINE W REFLEX MICROSCOPIC
Bilirubin Urine: NEGATIVE
Glucose, UA: 50 mg/dL — AB
Hgb urine dipstick: NEGATIVE
Ketones, ur: NEGATIVE mg/dL
Leukocytes,Ua: NEGATIVE
Nitrite: NEGATIVE
Protein, ur: >=300 mg/dL — AB
Specific Gravity, Urine: 1.01 (ref 1.005–1.030)
pH: 9 — ABNORMAL HIGH (ref 5.0–8.0)

## 2021-09-08 LAB — CBG MONITORING, ED
Glucose-Capillary: 116 mg/dL — ABNORMAL HIGH (ref 70–99)
Glucose-Capillary: 82 mg/dL (ref 70–99)

## 2021-09-08 MED ORDER — MECLIZINE HCL 12.5 MG PO TABS
25.0000 mg | ORAL_TABLET | Freq: Three times a day (TID) | ORAL | Status: DC | PRN
  Administered 2021-09-09: 25 mg via ORAL
  Filled 2021-09-08: qty 2

## 2021-09-08 MED ORDER — CARVEDILOL 12.5 MG PO TABS
12.5000 mg | ORAL_TABLET | Freq: Two times a day (BID) | ORAL | Status: DC
  Administered 2021-09-09 (×2): 12.5 mg via ORAL
  Filled 2021-09-08 (×2): qty 1

## 2021-09-08 MED ORDER — DIPHENHYDRAMINE HCL 50 MG/ML IJ SOLN
12.5000 mg | Freq: Once | INTRAMUSCULAR | Status: AC
  Administered 2021-09-08: 12.5 mg via INTRAVENOUS
  Filled 2021-09-08: qty 1

## 2021-09-08 MED ORDER — SODIUM CHLORIDE 0.9 % IV BOLUS
1000.0000 mL | Freq: Once | INTRAVENOUS | Status: AC
  Administered 2021-09-09: 1000 mL via INTRAVENOUS

## 2021-09-08 MED ORDER — SUMATRIPTAN SUCCINATE 6 MG/0.5ML ~~LOC~~ SOLN
6.0000 mg | SUBCUTANEOUS | Status: DC | PRN
  Filled 2021-09-08: qty 0.5

## 2021-09-08 MED ORDER — SODIUM CHLORIDE 0.9 % IV BOLUS
1000.0000 mL | Freq: Once | INTRAVENOUS | Status: AC
  Administered 2021-09-08: 1000 mL via INTRAVENOUS

## 2021-09-08 MED ORDER — LORAZEPAM 2 MG/ML IJ SOLN
1.0000 mg | Freq: Once | INTRAMUSCULAR | Status: AC
  Administered 2021-09-08: 1 mg via INTRAVENOUS
  Filled 2021-09-08: qty 1

## 2021-09-08 MED ORDER — AMLODIPINE BESYLATE 5 MG PO TABS
5.0000 mg | ORAL_TABLET | Freq: Every day | ORAL | Status: DC
  Administered 2021-09-08 – 2021-09-12 (×5): 5 mg via ORAL
  Filled 2021-09-08 (×5): qty 1

## 2021-09-08 MED ORDER — HYDRALAZINE HCL 20 MG/ML IJ SOLN
10.0000 mg | Freq: Once | INTRAMUSCULAR | Status: AC
  Administered 2021-09-08: 10 mg via INTRAVENOUS
  Filled 2021-09-08: qty 1

## 2021-09-08 MED ORDER — METOCLOPRAMIDE HCL 5 MG/ML IJ SOLN
10.0000 mg | Freq: Once | INTRAMUSCULAR | Status: AC
Start: 2021-09-08 — End: 2021-09-08
  Administered 2021-09-08: 10 mg via INTRAVENOUS
  Filled 2021-09-08: qty 2

## 2021-09-08 MED ORDER — METOPROLOL TARTRATE 5 MG/5ML IV SOLN
5.0000 mg | Freq: Once | INTRAVENOUS | Status: AC
  Administered 2021-09-08: 5 mg via INTRAVENOUS
  Filled 2021-09-08: qty 5

## 2021-09-08 MED ORDER — NICARDIPINE HCL IN NACL 20-0.86 MG/200ML-% IV SOLN
3.0000 mg/h | INTRAVENOUS | Status: DC
  Administered 2021-09-09 (×2): 5 mg/h via INTRAVENOUS
  Administered 2021-09-10: 3.5 mg/h via INTRAVENOUS
  Administered 2021-09-10: 5 mg/h via INTRAVENOUS
  Filled 2021-09-08 (×3): qty 200

## 2021-09-08 NOTE — ED Provider Notes (Signed)
?[facility] ?Provider Note ? ? ?CSN: [id number] ?Arrival date & time: 09/08/21  1223 ? ?  ? ?History ? ?Chief Complaint  ?Patient presents with  ? Dizziness  ? ? ?Jasmine [name] is a 51 y.o. female with a history significant for migraine headaches, CHF, chronic kidney disease, insulin-dependent diabetes, hypertension presenting for evaluation of a 24-hour history of right-sided frontal headache in association with lightheadedness and nausea which started yesterday evening.  She states her headache is similar to prior migraine headaches.  She has had no fevers or chills, denies visual changes, denies diarrhea, chest pain or shortness of breath, also denies focal numbness or weakness.  She does have light sensitivity.  She has been unable to tolerate p.o. intake today including her blood pressure medications.  She states she missed her Coreg yesterday morning and this morning as well, she was able to take her Norvasc yesterday evening however.  She has found no alleviators for headache. ? ?The history is provided by the patient.  ? ?  ? ?Home Medications ?Prior to Admission medications   ?Medication Sig Start Date End Date Taking? Authorizing Provider  ?amLODipine (NORVASC) 5 MG tablet Take 1 tablet (5 mg total) by mouth daily. 04/15/21 04/10/22 Yes Buford Dresser, MD  ?APPLE CIDER VINEGAR PO Take 1 tablet by mouth daily.   Yes [provider]  ?aspirin EC 81 MG tablet Take 81 mg by mouth at bedtime.    Yes [provider]  ?buPROPion (WELLBUTRIN XL) 300 MG 24 hr tablet Take 1 tablet (300 mg total) by mouth daily. 06/29/21 12/26/21 Yes Norman Clay, MD  ?carvedilol (COREG) 25 MG tablet Take 12.5 mg by mouth 2 (two) times daily with a meal.   Yes [provider]  ?Cholecalciferol 50 MCG (2000 UT) TABS Take 1 tablet by mouth daily.  07/26/18  Yes [provider]  ?Coenzyme Q10 (COQ-10 PO) Take 1 capsule by mouth daily.   Yes [provider]   ?dicyclomine (BENTYL) 10 MG capsule Take 10 mg by mouth 3 (three) times daily as needed for spasms.   Yes [provider]  ?DULoxetine (CYMBALTA) 60 MG capsule Take 1 capsule (60 mg total) by mouth daily. 09/27/21 01/25/22 Yes Norman Clay, MD  ?fluticasone (FLONASE) 50 MCG/ACT nasal spray Place 2 sprays into the nose daily as needed for allergies.    Yes [provider]  ?folic acid (FOLVITE) 876 MCG tablet Take 400 mcg by mouth every evening.    Yes [provider]  ?furosemide (LASIX) 20 MG tablet Take 20 mg by mouth daily.    Yes [provider]  ?gabapentin (NEURONTIN) 300 MG capsule Take 300 mg by mouth 2 (two) times daily. One capsule in am and 2 capsules in evening 02/25/20  Yes [provider]  ?insulin glargine (LANTUS) 100 UNIT/ML injection Inject 0.5 mLs (50 Units total) into the skin daily. ?Patient taking differently: Inject 50 Units into the skin daily as needed (when pump is not working). 09/04/18  Yes Derrill Center, NP  ?lamoTRIgine (LAMICTAL) 200 MG tablet Take 1 tablet (200 mg total) by mouth daily. 09/27/21 01/25/22 Yes Hisada, Elie Goody, MD  ?levothyroxine (SYNTHROID, LEVOTHROID) 150 MCG tablet Take 150 mcg by mouth daily before breakfast.   Yes [provider]  ?LORazepam (ATIVAN) 0.5 MG tablet Take 1 tablet (0.5 mg total) by mouth daily as needed for anxiety. 07/02/21 09/30/21 Yes Hisada, Elie Goody, MD  ?NOVOLOG 100 UNIT/ML injection Inject 2.425-2.95 Units into the  skin continuous. Via pump ?7pm to midnight 2.950 ?8:30am -7pm2.925 ?Midnight - 8:30 2.425 02/24/17  Yes [provider]  ?Omega-3 Fatty Acids (FISH OIL PO) Take 1 capsule by mouth daily.   Yes [provider]  ?omeprazole (PRILOSEC) 20 MG capsule Take 20 mg by mouth at bedtime.    Yes [provider]  ?ondansetron (ZOFRAN ODT) 4 MG disintegrating tablet Take 1 tablet (4 mg total) by mouth every 8 (eight) hours as needed. 03/23/21  Yes Marcial Pacas, MD  ?oxyCODONE  (OXY IR/ROXICODONE) 5 MG immediate release tablet Take 5 mg by mouth 4 (four) times daily as needed. 11/27/20  Yes [provider]  ?OXYGEN Inhale 3 L into the lungs as needed.   Yes [provider]  ?pravastatin (PRAVACHOL) 80 MG tablet Take 80 mg by mouth at bedtime.   Yes [provider]  ?PROAIR HFA 108 (90 Base) MCG/ACT inhaler Inhale 1-2 puffs into the lungs every 6 (six) hours as needed for wheezing or shortness of breath.  12/30/16  Yes [provider]  ?Continuous Blood Gluc Sensor MISC 1 each by Does not apply route as directed. Use as directed every 14 days. May dispense FreeStyle [employer] or similar.    [provider]  ?desipramine (NORPRAMIN) 25 MG tablet Take 1 tablet (25 mg total) by mouth daily. 07/30/21 08/29/21  Norman Clay, MD  ?glucagon (GLUCAGEN HYPOKIT) 1 MG SOLR injection GlucaGen HypoKit 1 mg Injection 03/13/20   [provider]  ?rizatriptan (MAXALT) 10 MG tablet Take 1 tablet (10 mg total) by mouth as needed for migraine. May repeat in 2 hours if needed 03/23/21   Marcial Pacas, MD  ?   ? ?Allergies    ?Ciprofloxacin, Levaquin [levofloxacin], Buspar [buspirone], Linaclotide, Advair diskus [fluticasone-salmeterol], Biaxin [clarithromycin], and Hydroxyzine   ? ?Review of Systems   ?Review of Systems  ?Constitutional:  Negative for chills and fever.  ?HENT:  Negative for congestion and sore throat.   ?Eyes:  Positive for photophobia. Negative for visual disturbance.  ?Respiratory:  Negative for chest tightness and shortness of breath.   ?Cardiovascular:  Negative for chest pain.  ?Gastrointestinal:  Negative for abdominal pain, nausea and vomiting.  ?Genitourinary: Negative.   ?Musculoskeletal:  Negative for arthralgias, joint swelling and neck pain.  ?Skin: Negative.  Negative for rash and wound.  ?Neurological:  Positive for light-headedness and headaches. Negative for dizziness, seizures, weakness and numbness.   ?Psychiatric/Behavioral: Negative.    ? ?Physical Exam ?Updated Vital Signs ?BP (!) 181/85   Pulse 74   Temp 98 ?F (36.7 ?C) (Oral)   Resp 19   Ht 5\' 9"  (1.753 m)   Wt (!) 145.2 kg   LMP 03/23/2017 (Approximate)   SpO2 94%   BMI 47.26 kg/m?  ?Physical Exam ?Vitals and nursing note reviewed.  ?Constitutional:   ?   Appearance: She is well-developed.  ?   Comments: Uncomfortable appearing  ?HENT:  ?   Head: Normocephalic and atraumatic.  ?   Right Ear: Tympanic membrane normal.  ?   Left Ear: Tympanic membrane normal.  ?Eyes:  ?   Extraocular Movements: Extraocular movements intact.  ?   Pupils: Pupils are equal, round, and reactive to light.  ?Cardiovascular:  ?   Rate and Rhythm: Normal rate.  ?   Heart sounds: Normal heart sounds.  ?Pulmonary:  ?   Effort: Pulmonary effort is normal.  ?Abdominal:  ?   Palpations: Abdomen is soft.  ?   Tenderness:  There is no abdominal tenderness.  ?Musculoskeletal:     ?   General: Normal range of motion.  ?   Cervical back: Normal range of motion and neck supple.  ?Lymphadenopathy:  ?   Cervical: No cervical adenopathy.  ?Skin: ?   General: Skin is warm and dry.  ?   Findings: No rash.  ?Neurological:  ?   General: No focal deficit present.  ?   Mental Status: She is alert and oriented to person, place, and time.  ?   GCS: GCS eye subscore is 4. GCS verbal subscore is 5. GCS motor subscore is 6.  ?   Cranial Nerves: No cranial nerve deficit.  ?   Sensory: No sensory deficit.  ?   Coordination: Coordination normal.  ?   Gait: Gait normal.  ?   Deep Tendon Reflexes: Reflexes normal.  ?   Comments:  normal rapid alternating movements. Cranial nerves III-XII intact.  No pronator drift.    ?Psychiatric:     ?   Speech: Speech normal.     ?   Behavior: Behavior normal.     ?   Thought Content: Thought content normal.  ? ? ?ED Results / Procedures / Treatments   ?Labs ?(all labs ordered are listed, but only abnormal results are displayed) ?Labs Reviewed  ?URINALYSIS, ROUTINE W  REFLEX MICROSCOPIC - Abnormal; Notable for the following components:  ?    Result Value  ? APPearance CLOUDY (*)   ? pH 9.0 (*)   ? Glucose, UA 50 (*)   ? Protein, ur >=300 (*)   ? Bacteria, UA RARE (*)   ? All other components within normal

## 2021-09-08 NOTE — ED Triage Notes (Signed)
Patient via EMS with complaints of dizziness and nausea that started the previous night.  ?

## 2021-09-08 NOTE — ED Provider Notes (Signed)
Pt's care asummed at 7:30 pm.  Pt has an elevated blood pressure and migraine headache.  Pt did not take her blood pressure medications this am.  Pt given hydralazine earlier. Blood pressure remains elevated.  I ordered pt's home medications and blood pressure improved however pt reports increased dizziness an headache.  Current blood pressure is 165/84.  Pulse ox is 97%.  Pt has documented low reading because monitor slides down her finger.  I ordered second liter of fluids.  I will consult hospitalist for admission  ? ?  ?Fransico Meadow, [place] ?09/08/21 2308 ? ?  ?Margette Fast, MD ?09/09/21 1211 ? ?

## 2021-09-08 NOTE — ED Notes (Signed)
Pt c/o dizziness and nausea x 3 days. ?

## 2021-09-09 DIAGNOSIS — R778 Other specified abnormalities of plasma proteins: Secondary | ICD-10-CM | POA: Diagnosis not present

## 2021-09-09 DIAGNOSIS — E871 Hypo-osmolality and hyponatremia: Secondary | ICD-10-CM

## 2021-09-09 DIAGNOSIS — Z20822 Contact with and (suspected) exposure to covid-19: Secondary | ICD-10-CM | POA: Diagnosis present

## 2021-09-09 DIAGNOSIS — N179 Acute kidney failure, unspecified: Secondary | ICD-10-CM | POA: Diagnosis present

## 2021-09-09 DIAGNOSIS — I169 Hypertensive crisis, unspecified: Secondary | ICD-10-CM

## 2021-09-09 DIAGNOSIS — E869 Volume depletion, unspecified: Secondary | ICD-10-CM | POA: Diagnosis present

## 2021-09-09 DIAGNOSIS — E039 Hypothyroidism, unspecified: Secondary | ICD-10-CM | POA: Diagnosis present

## 2021-09-09 DIAGNOSIS — N185 Chronic kidney disease, stage 5: Secondary | ICD-10-CM | POA: Diagnosis present

## 2021-09-09 DIAGNOSIS — E7849 Other hyperlipidemia: Secondary | ICD-10-CM

## 2021-09-09 DIAGNOSIS — M1990 Unspecified osteoarthritis, unspecified site: Secondary | ICD-10-CM | POA: Diagnosis present

## 2021-09-09 DIAGNOSIS — I120 Hypertensive chronic kidney disease with stage 5 chronic kidney disease or end stage renal disease: Secondary | ICD-10-CM | POA: Diagnosis present

## 2021-09-09 DIAGNOSIS — G43709 Chronic migraine without aura, not intractable, without status migrainosus: Secondary | ICD-10-CM | POA: Diagnosis present

## 2021-09-09 DIAGNOSIS — E0810 Diabetes mellitus due to underlying condition with ketoacidosis without coma: Secondary | ICD-10-CM | POA: Diagnosis not present

## 2021-09-09 DIAGNOSIS — E782 Mixed hyperlipidemia: Secondary | ICD-10-CM | POA: Diagnosis present

## 2021-09-09 DIAGNOSIS — Z6841 Body Mass Index (BMI) 40.0 and over, adult: Secondary | ICD-10-CM | POA: Diagnosis not present

## 2021-09-09 DIAGNOSIS — E1065 Type 1 diabetes mellitus with hyperglycemia: Secondary | ICD-10-CM | POA: Diagnosis not present

## 2021-09-09 DIAGNOSIS — M797 Fibromyalgia: Secondary | ICD-10-CM | POA: Diagnosis present

## 2021-09-09 DIAGNOSIS — R110 Nausea: Secondary | ICD-10-CM | POA: Diagnosis present

## 2021-09-09 DIAGNOSIS — I248 Other forms of acute ischemic heart disease: Secondary | ICD-10-CM | POA: Diagnosis not present

## 2021-09-09 DIAGNOSIS — I160 Hypertensive urgency: Secondary | ICD-10-CM | POA: Diagnosis present

## 2021-09-09 DIAGNOSIS — Z8249 Family history of ischemic heart disease and other diseases of the circulatory system: Secondary | ICD-10-CM | POA: Diagnosis not present

## 2021-09-09 DIAGNOSIS — K219 Gastro-esophageal reflux disease without esophagitis: Secondary | ICD-10-CM

## 2021-09-09 DIAGNOSIS — Z87891 Personal history of nicotine dependence: Secondary | ICD-10-CM | POA: Diagnosis not present

## 2021-09-09 DIAGNOSIS — G9341 Metabolic encephalopathy: Secondary | ICD-10-CM | POA: Diagnosis present

## 2021-09-09 DIAGNOSIS — F3181 Bipolar II disorder: Secondary | ICD-10-CM

## 2021-09-09 DIAGNOSIS — E1010 Type 1 diabetes mellitus with ketoacidosis without coma: Secondary | ICD-10-CM | POA: Diagnosis present

## 2021-09-09 DIAGNOSIS — E0811 Diabetes mellitus due to underlying condition with ketoacidosis with coma: Secondary | ICD-10-CM

## 2021-09-09 DIAGNOSIS — E1165 Type 2 diabetes mellitus with hyperglycemia: Secondary | ICD-10-CM

## 2021-09-09 DIAGNOSIS — Z825 Family history of asthma and other chronic lower respiratory diseases: Secondary | ICD-10-CM | POA: Diagnosis not present

## 2021-09-09 DIAGNOSIS — E1110 Type 2 diabetes mellitus with ketoacidosis without coma: Secondary | ICD-10-CM

## 2021-09-09 DIAGNOSIS — Z794 Long term (current) use of insulin: Secondary | ICD-10-CM

## 2021-09-09 DIAGNOSIS — N184 Chronic kidney disease, stage 4 (severe): Secondary | ICD-10-CM

## 2021-09-09 DIAGNOSIS — G4733 Obstructive sleep apnea (adult) (pediatric): Secondary | ICD-10-CM | POA: Diagnosis present

## 2021-09-09 LAB — BASIC METABOLIC PANEL
Anion gap: 18 — ABNORMAL HIGH (ref 5–15)
Anion gap: 13 (ref 5–15)
Anion gap: 9 (ref 5–15)
BUN: 40 mg/dL — ABNORMAL HIGH (ref 6–20)
BUN: 40 mg/dL — ABNORMAL HIGH (ref 6–20)
BUN: 39 mg/dL — ABNORMAL HIGH (ref 6–20)
CO2: 17 mmol/L — ABNORMAL LOW (ref 22–32)
CO2: 21 mmol/L — ABNORMAL LOW (ref 22–32)
CO2: 24 mmol/L (ref 22–32)
Calcium: 10.5 mg/dL — ABNORMAL HIGH (ref 8.9–10.3)
Calcium: 10.3 mg/dL (ref 8.9–10.3)
Calcium: 10.5 mg/dL — ABNORMAL HIGH (ref 8.9–10.3)
Chloride: 94 mmol/L — ABNORMAL LOW (ref 98–111)
Chloride: 98 mmol/L (ref 98–111)
Chloride: 99 mmol/L (ref 98–111)
Creatinine, Ser: 3.82 mg/dL — ABNORMAL HIGH (ref 0.44–1.00)
Creatinine, Ser: 3.55 mg/dL — ABNORMAL HIGH (ref 0.44–1.00)
Creatinine, Ser: 3.62 mg/dL — ABNORMAL HIGH (ref 0.44–1.00)
GFR, Estimated: 14 mL/min — ABNORMAL LOW (ref >60)
GFR, Estimated: 15 mL/min — ABNORMAL LOW (ref >60)
GFR, Estimated: 15 mL/min — ABNORMAL LOW (ref >60)
Glucose, Bld: 668 mg/dL (ref 70–99)
Glucose, Bld: 390 mg/dL — ABNORMAL HIGH (ref 70–99)
Glucose, Bld: 169 mg/dL — ABNORMAL HIGH (ref 70–99)
Potassium: 4.9 mmol/L (ref 3.5–5.1)
Potassium: 4.4 mmol/L (ref 3.5–5.1)
Potassium: 4.2 mmol/L (ref 3.5–5.1)
Sodium: 129 mmol/L — ABNORMAL LOW (ref 135–145)
Sodium: 132 mmol/L — ABNORMAL LOW (ref 135–145)
Sodium: 132 mmol/L — ABNORMAL LOW (ref 135–145)

## 2021-09-09 LAB — CBG MONITORING, ED
Glucose-Capillary: >600 mg/dL (ref 70–99)
Glucose-Capillary: >600 mg/dL (ref 70–99)
Glucose-Capillary: 514 mg/dL (ref 70–99)
Glucose-Capillary: 515 mg/dL (ref 70–99)
Glucose-Capillary: 542 mg/dL (ref 70–99)
Glucose-Capillary: 370 mg/dL — ABNORMAL HIGH (ref 70–99)
Glucose-Capillary: 428 mg/dL — ABNORMAL HIGH (ref 70–99)
Glucose-Capillary: 281 mg/dL — ABNORMAL HIGH (ref 70–99)
Glucose-Capillary: 201 mg/dL — ABNORMAL HIGH (ref 70–99)
Glucose-Capillary: 209 mg/dL — ABNORMAL HIGH (ref 70–99)

## 2021-09-09 LAB — GLUCOSE, CAPILLARY
Glucose-Capillary: 143 mg/dL — ABNORMAL HIGH (ref 70–99)
Glucose-Capillary: 205 mg/dL — ABNORMAL HIGH (ref 70–99)
Glucose-Capillary: 146 mg/dL — ABNORMAL HIGH (ref 70–99)
Glucose-Capillary: 169 mg/dL — ABNORMAL HIGH (ref 70–99)
Glucose-Capillary: 159 mg/dL — ABNORMAL HIGH (ref 70–99)
Glucose-Capillary: 176 mg/dL — ABNORMAL HIGH (ref 70–99)

## 2021-09-09 LAB — COMPREHENSIVE METABOLIC PANEL
ALT: 15 U/L (ref 0–44)
AST: 13 U/L — ABNORMAL LOW (ref 15–41)
Albumin: 3 g/dL — ABNORMAL LOW (ref 3.5–5.0)
Alkaline Phosphatase: 99 U/L (ref 38–126)
Anion gap: 16 — ABNORMAL HIGH (ref 5–15)
BUN: 36 mg/dL — ABNORMAL HIGH (ref 6–20)
CO2: 18 mmol/L — ABNORMAL LOW (ref 22–32)
Calcium: 10.2 mg/dL (ref 8.9–10.3)
Chloride: 94 mmol/L — ABNORMAL LOW (ref 98–111)
Creatinine, Ser: 3.57 mg/dL — ABNORMAL HIGH (ref 0.44–1.00)
GFR, Estimated: 15 mL/min — ABNORMAL LOW (ref >60)
Glucose, Bld: 796 mg/dL (ref 70–99)
Potassium: 5.9 mmol/L — ABNORMAL HIGH (ref 3.5–5.1)
Sodium: 128 mmol/L — ABNORMAL LOW (ref 135–145)
Total Bilirubin: 1.3 mg/dL — ABNORMAL HIGH (ref 0.3–1.2)
Total Protein: 6.5 g/dL (ref 6.5–8.1)

## 2021-09-09 LAB — BLOOD GAS, VENOUS
Acid-base deficit: 8.4 mmol/L — ABNORMAL HIGH (ref 0.0–2.0)
Bicarbonate: 17.9 mmol/L — ABNORMAL LOW (ref 20.0–28.0)
Drawn by: 533361
FIO2: 21 %
O2 Saturation: 68 %
Patient temperature: 36.8
pCO2, Ven: 39 mmHg — ABNORMAL LOW (ref 44–60)
pH, Ven: 7.27 (ref 7.25–7.43)
pO2, Ven: 40 mmHg (ref 32–45)

## 2021-09-09 LAB — RESP PANEL BY RT-PCR (FLU A&B, COVID) ARPGX2
Influenza A by PCR: NEGATIVE
Influenza B by PCR: NEGATIVE
SARS Coronavirus 2 by RT PCR: NEGATIVE

## 2021-09-09 LAB — HIV ANTIBODY (ROUTINE TESTING W REFLEX): HIV Screen 4th Generation wRfx: NONREACTIVE

## 2021-09-09 LAB — CBC WITH DIFFERENTIAL/PLATELET
Abs Immature Granulocytes: 0.06 10*3/uL (ref 0.00–0.07)
Basophils Absolute: 0.1 10*3/uL (ref 0.0–0.1)
Basophils Relative: 1 %
Eosinophils Absolute: 0 10*3/uL (ref 0.0–0.5)
Eosinophils Relative: 0 %
HCT: 41.4 % (ref 36.0–46.0)
Hemoglobin: 12.7 g/dL (ref 12.0–15.0)
Immature Granulocytes: 0 %
Lymphocytes Relative: 7 %
Lymphs Abs: 1 10*3/uL (ref 0.7–4.0)
MCH: 28 pg (ref 26.0–34.0)
MCHC: 30.7 g/dL (ref 30.0–36.0)
MCV: 91.2 fL (ref 80.0–100.0)
Monocytes Absolute: 0.5 10*3/uL (ref 0.1–1.0)
Monocytes Relative: 3 %
Neutro Abs: 13.6 10*3/uL — ABNORMAL HIGH (ref 1.7–7.7)
Neutrophils Relative %: 89 %
Platelets: 300 10*3/uL (ref 150–400)
RBC: 4.54 MIL/uL (ref 3.87–5.11)
RDW: 14.4 % (ref 11.5–15.5)
WBC: 15.3 10*3/uL — ABNORMAL HIGH (ref 4.0–10.5)
nRBC: 0 % (ref 0.0–0.2)

## 2021-09-09 LAB — TSH: TSH: 10.434 u[IU]/mL — ABNORMAL HIGH (ref 0.350–4.500)

## 2021-09-09 LAB — HEMOGLOBIN A1C
Hgb A1c MFr Bld: 6.7 % — ABNORMAL HIGH (ref 4.8–5.6)
Mean Plasma Glucose: 146 mg/dL

## 2021-09-09 LAB — MAGNESIUM: Magnesium: 1.8 mg/dL (ref 1.7–2.4)

## 2021-09-09 LAB — MRSA NEXT GEN BY PCR, NASAL: MRSA by PCR Next Gen: NOT DETECTED

## 2021-09-09 LAB — OSMOLALITY: Osmolality: 326 mOsm/kg (ref 275–295)

## 2021-09-09 MED ORDER — INSULIN ASPART 100 UNIT/ML IJ SOLN: 0.0000 [IU] | Freq: Every day | INTRAMUSCULAR | Status: DC

## 2021-09-09 MED ORDER — ALBUTEROL SULFATE HFA 108 (90 BASE) MCG/ACT IN AERS: 1.0000 | INHALATION_SPRAY | Freq: Four times a day (QID) | RESPIRATORY_TRACT | Status: DC | PRN

## 2021-09-09 MED ORDER — ALBUTEROL SULFATE (2.5 MG/3ML) 0.083% IN NEBU: 2.5000 mg | INHALATION_SOLUTION | Freq: Four times a day (QID) | RESPIRATORY_TRACT | Status: DC | PRN

## 2021-09-09 MED ORDER — DULOXETINE HCL 60 MG PO CPEP: 60.0000 mg | ORAL_CAPSULE | Freq: Every day | ORAL | Status: DC

## 2021-09-09 MED ORDER — LAMOTRIGINE 100 MG PO TABS: 200.0000 mg | ORAL_TABLET | Freq: Every day | ORAL | Status: DC

## 2021-09-09 MED ORDER — GABAPENTIN 300 MG PO CAPS
600.0000 mg | ORAL_CAPSULE | Freq: Every day | ORAL | Status: DC
  Administered 2021-09-09: 600 mg via ORAL
  Filled 2021-09-09: qty 2

## 2021-09-09 MED ORDER — INSULIN GLARGINE-YFGN 100 UNIT/ML ~~LOC~~ SOLN
30.0000 [IU] | Freq: Every day | SUBCUTANEOUS | Status: DC
  Administered 2021-09-09 – 2021-09-11 (×3): 30 [IU] via SUBCUTANEOUS
  Filled 2021-09-09 (×4): qty 0.3

## 2021-09-09 MED ORDER — DEXTROSE 50 % IV SOLN
0.0000 mL | INTRAVENOUS | Status: DC | PRN
  Administered 2021-09-10: 50 mL via INTRAVENOUS
  Filled 2021-09-09: qty 50

## 2021-09-09 MED ORDER — ONDANSETRON HCL 4 MG/2ML IJ SOLN
4.0000 mg | Freq: Four times a day (QID) | INTRAMUSCULAR | Status: DC | PRN
Start: 2021-09-09 — End: 2021-09-12

## 2021-09-09 MED ORDER — ACETAMINOPHEN 325 MG PO TABS
650.0000 mg | ORAL_TABLET | Freq: Four times a day (QID) | ORAL | Status: DC | PRN
  Administered 2021-09-11 – 2021-09-12 (×3): 650 mg via ORAL
  Filled 2021-09-09 (×3): qty 2

## 2021-09-09 MED ORDER — DICYCLOMINE HCL 10 MG PO CAPS: 10.0000 mg | ORAL_CAPSULE | Freq: Three times a day (TID) | ORAL | Status: DC | PRN

## 2021-09-09 MED ORDER — INSULIN ASPART 100 UNIT/ML IJ SOLN
0.0000 [IU] | Freq: Three times a day (TID) | INTRAMUSCULAR | Status: DC
  Administered 2021-09-09: 15 [IU] via SUBCUTANEOUS
  Filled 2021-09-09: qty 1

## 2021-09-09 MED ORDER — HYDRALAZINE HCL 20 MG/ML IJ SOLN
10.0000 mg | Freq: Four times a day (QID) | INTRAMUSCULAR | Status: DC | PRN
  Administered 2021-09-09 – 2021-09-11 (×3): 10 mg via INTRAVENOUS
  Filled 2021-09-09 (×4): qty 1

## 2021-09-09 MED ORDER — LACTATED RINGERS IV SOLN: INTRAVENOUS | Status: DC

## 2021-09-09 MED ORDER — LORAZEPAM 0.5 MG PO TABS
0.5000 mg | ORAL_TABLET | Freq: Every day | ORAL | Status: DC | PRN
  Administered 2021-09-09: 0.5 mg via ORAL
  Filled 2021-09-09 (×3): qty 1

## 2021-09-09 MED ORDER — OXYCODONE HCL 5 MG PO TABS
5.0000 mg | ORAL_TABLET | Freq: Four times a day (QID) | ORAL | Status: DC | PRN
  Administered 2021-09-09 (×2): 5 mg via ORAL
  Filled 2021-09-09 (×3): qty 1

## 2021-09-09 MED ORDER — INSULIN GLARGINE-YFGN 100 UNIT/ML ~~LOC~~ SOLN
30.0000 [IU] | Freq: Every day | SUBCUTANEOUS | Status: DC
  Filled 2021-09-09: qty 0.3

## 2021-09-09 MED ORDER — INSULIN ASPART 100 UNIT/ML IJ SOLN
0.0000 [IU] | Freq: Three times a day (TID) | INTRAMUSCULAR | Status: DC
  Administered 2021-09-10: 11 [IU] via SUBCUTANEOUS
  Administered 2021-09-11: 3 [IU] via SUBCUTANEOUS
  Administered 2021-09-11: 15 [IU] via SUBCUTANEOUS
  Administered 2021-09-11: 2 [IU] via SUBCUTANEOUS
  Administered 2021-09-12: 8 [IU] via SUBCUTANEOUS
  Administered 2021-09-12: 5 [IU] via SUBCUTANEOUS

## 2021-09-09 MED ORDER — ASPIRIN EC 81 MG PO TBEC
81.0000 mg | DELAYED_RELEASE_TABLET | Freq: Every day | ORAL | Status: DC
  Administered 2021-09-09 – 2021-09-11 (×3): 81 mg via ORAL
  Filled 2021-09-09 (×3): qty 1

## 2021-09-09 MED ORDER — INSULIN ASPART 100 UNIT/ML IJ SOLN: 0.0000 [IU] | Freq: Three times a day (TID) | INTRAMUSCULAR | Status: DC

## 2021-09-09 MED ORDER — INSULIN REGULAR(HUMAN) IN NACL 100-0.9 UT/100ML-% IV SOLN
INTRAVENOUS | Status: DC
  Administered 2021-09-09: 8 [IU]/h via INTRAVENOUS
  Filled 2021-09-09: qty 100

## 2021-09-09 MED ORDER — GABAPENTIN 300 MG PO CAPS
300.0000 mg | ORAL_CAPSULE | Freq: Two times a day (BID) | ORAL | Status: DC
  Filled 2021-09-09: qty 1

## 2021-09-09 MED ORDER — SODIUM CHLORIDE 0.9 % IV SOLN
INTRAVENOUS | Status: DC
Start: 2021-09-09 — End: 2021-09-12

## 2021-09-09 MED ORDER — DEXTROSE IN LACTATED RINGERS 5 % IV SOLN: INTRAVENOUS | Status: DC

## 2021-09-09 MED ORDER — LACTATED RINGERS IV BOLUS
20.0000 mL/kg | Freq: Once | INTRAVENOUS | Status: AC
  Administered 2021-09-09: 2904 mL via INTRAVENOUS

## 2021-09-09 MED ORDER — INSULIN ASPART 100 UNIT/ML IJ SOLN
4.0000 [IU] | Freq: Three times a day (TID) | INTRAMUSCULAR | Status: DC
  Administered 2021-09-10 – 2021-09-12 (×7): 4 [IU] via SUBCUTANEOUS

## 2021-09-09 MED ORDER — BUPROPION HCL ER (XL) 150 MG PO TB24
300.0000 mg | ORAL_TABLET | Freq: Every day | ORAL | Status: DC
  Administered 2021-09-09 – 2021-09-12 (×4): 300 mg via ORAL
  Filled 2021-09-09 (×4): qty 2

## 2021-09-09 MED ORDER — ACETAMINOPHEN 650 MG RE SUPP
650.0000 mg | Freq: Four times a day (QID) | RECTAL | Status: DC | PRN
  Filled 2021-09-09: qty 1

## 2021-09-09 MED ORDER — HEPARIN SODIUM (PORCINE) 5000 UNIT/ML IJ SOLN
5000.0000 [IU] | Freq: Three times a day (TID) | INTRAMUSCULAR | Status: DC
  Administered 2021-09-09 – 2021-09-10 (×4): 5000 [IU] via SUBCUTANEOUS
  Filled 2021-09-09 (×4): qty 1

## 2021-09-09 MED ORDER — LEVOTHYROXINE SODIUM 50 MCG PO TABS: 150.0000 ug | ORAL_TABLET | Freq: Every day | ORAL | Status: DC

## 2021-09-09 MED ORDER — CHLORHEXIDINE GLUCONATE CLOTH 2 % EX PADS
6.0000 | MEDICATED_PAD | Freq: Every day | CUTANEOUS | Status: DC
  Administered 2021-09-10 – 2021-09-12 (×3): 6 via TOPICAL

## 2021-09-09 MED ORDER — ONDANSETRON HCL 4 MG PO TABS
4.0000 mg | ORAL_TABLET | Freq: Four times a day (QID) | ORAL | Status: DC | PRN
  Administered 2021-09-11: 4 mg via ORAL
  Filled 2021-09-09: qty 1

## 2021-09-09 MED ORDER — GABAPENTIN 300 MG PO CAPS
300.0000 mg | ORAL_CAPSULE | Freq: Every day | ORAL | Status: DC
  Administered 2021-09-09 – 2021-09-10 (×2): 300 mg via ORAL
  Filled 2021-09-09 (×2): qty 1

## 2021-09-09 MED ORDER — LEVOTHYROXINE SODIUM 75 MCG PO TABS
150.0000 ug | ORAL_TABLET | Freq: Every day | ORAL | Status: DC
  Administered 2021-09-09 – 2021-09-12 (×4): 150 ug via ORAL
  Filled 2021-09-09: qty 2
  Filled 2021-09-09: qty 3
  Filled 2021-09-09 (×2): qty 2

## 2021-09-09 MED ORDER — PANTOPRAZOLE SODIUM 40 MG PO TBEC
40.0000 mg | DELAYED_RELEASE_TABLET | Freq: Every day | ORAL | Status: DC
  Administered 2021-09-09 – 2021-09-12 (×4): 40 mg via ORAL
  Filled 2021-09-09 (×4): qty 1

## 2021-09-09 MED ORDER — PRAVASTATIN SODIUM 40 MG PO TABS
80.0000 mg | ORAL_TABLET | Freq: Every day | ORAL | Status: DC
  Administered 2021-09-09 – 2021-09-11 (×3): 80 mg via ORAL
  Filled 2021-09-09 (×3): qty 2

## 2021-09-09 NOTE — Progress Notes (Signed)
?  ?       ?PROGRESS NOTE ? ?Jasmine [name] QBH:[id number] DOB: 29-Sep-1970 DOA: 09/08/2021 ?PCP: Aletha Halim., PA-C ? ?Brief History:  ?51 year old female with a history of insulin-dependent diabetes mellitus, CKD stage IV, migraine headaches, fibromyalgia, bipolar type II disorder, hyperlipidemia, hypothyroidism presenting with 1 day history of nausea, vomiting, and dizziness.  The patient also had an associated bifrontal headache.  She stated that she was not able to tolerate her antihypertensive medications.  However she stated that she last took her amlodipine on 09/07/2021.  In the emergency department, the patient was noted to be hypertensive and initially given intravenous metoprolol and hydralazine.  Although her BP initially improved, it climbed back up for which the patient was started on intravenous Cardene.  She was initially given 2 L of IV fluids.  routine blood work subsequently showed that the patient had a serum glucose of 796 with anion gap of 16.  She was started on IV fluids and IV insulin. ?The patient is a difficult historian.  However, she stated that she has not been completely compliant with her antihypertensive medications nor her insulin.  She is not able to elaborate how often she misses her medications.  She denies any chest pain, shortness breath, abdominal pain, diarrhea, hematemesis, hematochezia, melena. ?Blood work showed sodium 120, potassium 5.9, bicarbonate 18, serum creatinine 3.57.  LFTs were unremarkable.  WBC 15.3, hemoglobin 12.7, platelets 200,000.  ? ? ? ?Assessment and Plan: ?DKA (diabetic ketoacidosis) ([place]) ?-patient started on IV insulin with q 1 hour CBG check and q 4 hour BMPs ?-pt started on aggressive fluid resuscitation ?-Electrolytes were monitored and repleted ?-transitioned to [place] insulin once anion gap closed ?-diet was advanced once anion gap closed ?-HbA1C-- ? ? ?Acute renal failure superimposed on stage 4 chronic kidney disease ([place]) ?Baseline creatinine  2.7-3.0 ?Presented with serum creatinine 3.57 ?Secondary to volume depletion ?Continue IV fluids ? ? ?Uncontrolled diabetes mellitus with hyperglycemia, with long-term current use of insulin ([name]) ?Check hemoglobin A1c ?Transition to [facility] once the patient's DKA is resolved ? ?HTN (hypertension) ?Restart carvedilol, amlodipine ? ?GERD (gastroesophageal reflux disease) ?Continue Protonix ? 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 ?Bipolar II disorder ([place]) ?Continue Cymbalta, Lamictal, bupropion ?Continue Ativan as needed ? ?Hypothyroidism ?TSH 10.434 ?Given the patient's questionable compliance, continue current dose of Synthroid ? ?Hyponatremia ?Secondary to volume depletion and hyperglycemia ?-Continue IV fluids ? ? ? ? ? ? ? ? ?Status is: Observation ?The patient will require care spanning > 2 midnights and should be moved to inpatient because: severity of illness requiring IV insulin drip ? ? ? ?Family Communication:  no Family at bedside ? ?Consultants:  none ? ?Code Status:  FULL ? ?DVT Prophylaxis:  [place] Heparin ? ? ?Procedures: ?As Listed in Progress Note Above ? ?Antibiotics: ?None ? ?Total time spent 50 minutes.  Greater than 50% spent face to face counseling and coordinating care. ? ? ? ? ?Subjective: ?Patient denies fevers, chills, headache, chest pain, dyspnea, nausea, vomiting, diarrhea, abdominal pain, dysuria, hematuria,  ? ? ?Objective: ?Vitals:  ? 09/09/21 0116 09/09/21 0330 09/09/21 0500 09/09/21 0700  ?BP: (!) 110/58 131/72 (!) 146/92 (!) 143/63  ?Pulse: 78 91 91 97  ?Resp: (!) 26 17  19   ?Temp:      ?TempSrc:      ?SpO2: 95% 92% 95% 91%  ?Weight:      ?Height:      ? ? ?  Intake/Output Summary (Last 24 hours) at 09/09/2021 0713 ?Last data filed at 09/09/2021 0702 ?Gross per 24 hour  ?Intake 171.89 ml  ?Output --  ?Net 171.89 ml  ? ?Weight change:  ?Exam: ? ?General:  Pt is alert, follows commands  appropriately, not in acute distress ?HEENT: No icterus, No thrush, No neck mass, [place]/AT ?Cardiovascular: RRR, S1/S2, no rubs, no gallops ?Respiratory: CTA bilaterally, no wheezing, no crackles, no rhonchi ?Abdomen: Soft/+BS, non tender, non distended, no guarding ?Extremities: No edema, No lymphangitis, No petechiae, No rashes, no synovitis ? ? ?Data Reviewed: ?I have personally reviewed following labs and imaging studies ?Basic Metabolic Panel: ?Recent Labs  ?Lab 09/08/21 ?1557 09/09/21 ?4854 09/09/21 ?6270  ?NA 136 128* 129*  ?K 4.1 5.9* 4.9  ?CL 100 94* 94*  ?CO2 26 18* 17*  ?GLUCOSE 217* 796* 668*  ?BUN 26* 36* 40*  ?CREATININE 3.33* 3.57* 3.82*  ?CALCIUM 11.4* 10.2 10.5*  ?MG  --  1.8  --   ? ?Liver Function Tests: ?Recent Labs  ?Lab 09/08/21 ?1557 09/09/21 ?0340  ?AST 16 13*  ?ALT 13 15  ?ALKPHOS 99 99  ?BILITOT 0.5 1.3*  ?PROT 7.0 6.5  ?ALBUMIN 3.2* 3.0*  ? ?No results for input(s): LIPASE, AMYLASE in the last 168 hours. ?No results for input(s): AMMONIA in the last 168 hours. ?Coagulation Profile: ?No results for input(s): INR, PROTIME in the last 168 hours. ?CBC: ?Recent Labs  ?Lab 09/08/21 ?1557 09/09/21 ?0340  ?WBC 9.5 15.3*  ?NEUTROABS 6.5 13.6*  ?HGB 13.8 12.7  ?HCT 43.5 41.4  ?MCV 87.7 91.2  ?PLT 297 300  ? ?Cardiac Enzymes: ?No results for input(s): CKTOTAL, CKMB, CKMBINDEX, TROPONINI in the last 168 hours. ?BNP: ?Invalid input(s): POCBNP ?CBG: ?Recent Labs  ?Lab 09/08/21 ?1605 09/08/21 ?3500 09/09/21 ?0410 09/09/21 ?0416  ?GLUCAP 116* 82 >600* >600*  ? ?HbA1C: ?No results for input(s): HGBA1C in the last 72 hours. ?Urine analysis: ?   ?Component Value Date/Time  ? COLORURINE YELLOW 09/08/2021 1528  ? APPEARANCEUR CLOUDY (A) 09/08/2021 1528  ? LABSPEC 1.010 09/08/2021 1528  ? PHURINE 9.0 (H) 09/08/2021 1528  ? GLUCOSEU 50 (A) 09/08/2021 1528  ? HGBUR NEGATIVE 09/08/2021 1528  ? [place] NEGATIVE 09/08/2021 1528  ? [place] NEGATIVE 09/08/2021 1528  ? PROTEINUR >=300 (A) 09/08/2021 1528  ?  UROBILINOGEN 0.2 03/19/2015 1525  ? NITRITE NEGATIVE 09/08/2021 1528  ? LEUKOCYTESUR NEGATIVE 09/08/2021 1528  ? ?Sepsis Labs: ?@[email or web address] (procalcitonin:4,lacticidven:4) ?)No results found for this or any previous visit (from the past 240 hour(s)).  ? ?Scheduled Meds: ? amLODipine  5 mg Oral Daily  ? aspirin EC  81 mg Oral QHS  ? buPROPion  300 mg Oral Daily  ? carvedilol  12.5 mg Oral BID WC  ? [START ON 09/27/2021] DULoxetine  60 mg Oral Daily  ? gabapentin  300 mg Oral Daily  ? gabapentin  600 mg Oral QHS  ? heparin  5,000 Units Subcutaneous Q8H  ? [START ON 09/27/2021] lamoTRIgine  200 mg Oral Daily  ? levothyroxine  150 mcg Oral Q0600  ? pantoprazole  40 mg Oral Daily  ? pravastatin  80 mg Oral QHS  ? ?Continuous Infusions: ? dextrose 5% lactated ringers    ? insulin 8 Units/hr (09/09/21 9381)  ? lactated ringers    ? niCARDipine Stopped (09/09/21 0555)  ? ? ?Procedures/Studies: ?CT Head Wo Contrast ? ?Result Date: 09/08/2021 ?CLINICAL DATA:  Dizziness and nausea since yesterday. EXAM: CT HEAD WITHOUT CONTRAST TECHNIQUE: Contiguous axial images were obtained from  the base of the skull through the vertex without intravenous contrast. RADIATION DOSE REDUCTION: This exam was performed according to the departmental dose-optimization program which includes automated exposure control, adjustment of the mA and/or kV according to patient size and/or use of iterative reconstruction technique. COMPARISON:  MRI Oct 12, 2018 and head CT March 16, 2020. FINDINGS: Brain: Global parenchymal volume loss with ex vacuo dilatation of the ventricular system similar prior. Similar burden of chronic microvascular ischemic white matter disease. No acute intracranial hemorrhage. No acute large vessel territory infarction. No mass lesion or midline shift. No extra-axial fluid collection. No hydrocephalus. Vascular: No hyperdense vessel. Atherosclerotic calcifications of the internal carotid arteries at the skull base. Skull: Normal.  Negative for fracture or focal lesion. Sinuses/Orbits: Visualized portions of the paranasal sinuses and ethmoid air cells are predominantly clear. Orbits are grossly unremarkable. Other: Mastoid air cells are predominantly cl

## 2021-09-09 NOTE — Assessment & Plan Note (Addendum)
Restart carvedilol, amlodipine ?-increase coreg to 25 mg bid ?-wean cardene drip for SBP <160 ?-add hydralazine 10 mg tid ?

## 2021-09-09 NOTE — ED Notes (Signed)
Pt provided with water and repositioned.  ?

## 2021-09-09 NOTE — ED Notes (Signed)
Patient does not have dexicom or insulin pump on at this time.  Nothing visibly attached to her.  Patient is confused and reports she had it on this am but unsure where it is at this tinme ?

## 2021-09-09 NOTE — Progress Notes (Signed)
Secure chat MD re: midline, patient is CKD4 and CrCl 21. Patient not a candidate for midline and PICC. PIV started via [place] on LFA. RN aware. ?

## 2021-09-09 NOTE — Assessment & Plan Note (Addendum)
-  patient started on IV insulin with q 1 hour CBG check and q 4 hour BMPs ?-pt started on aggressive fluid resuscitation ?-Electrolytes were monitored and repleted ?-transitioned to [place] insulin once anion gap closed ?-diet was advanced once anion gap closed ?09/09/21-HbA1C--6.7 ? ?

## 2021-09-09 NOTE — H&P (Signed)
?History and Physical  ? ? ?Patient: Jasmine [name] WGN:[id number] DOB: 1970/11/20 ?DOA: 09/08/2021 ?DOS: the patient was seen and examined on 09/09/2021 ?PCP: Aletha Halim., PA-C  ?Patient coming from: Home ? ?Chief Complaint:  ?Chief Complaint  ?Patient presents with  ? Dizziness  ? ?HPI: Jasmine [name] is a 51 y.o. female with medical history significant of with history of diabetes mellitus, hypertension, fibromyalgia, GERD, hypothyroidism, stress incontinence, former smoker, and more presents ED with a chief complaint of dizziness.  This started the day prior to presentation.  He feels like the room is spinning around her.  No history of vertigo.  The headache started at the same time.  Straight across the front of her forehead.  She is not sure when her last BP meds dose was, but she thinks it was 2 nights ago.  Patient reports that she cannot describe her headache.  She has had no blurry vision, no change in hearing, no asymmetric weakness.  No ringing in her ears.  Patient denies chest pain and palpitations. ? ?Patient has no other complaints. ?Review of Systems: As mentioned in the history of present illness. All other systems reviewed and are negative. ?Past Medical History:  ?Diagnosis Date  ? Anxiety   ? Arthritis   ? Asthma   ? Balance problems   ? Bipolar disorder ([place])   ? Charcot ankle   ? Chronic fatigue   ? 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 Depression   ? Diabetes mellitus   ? DKA, type 1 ([place]) 11/04/2011  ? Elevated cholesterol   ? Fibromyalgia   ? GERD (gastroesophageal reflux disease)   ? Headache   ? History of suicidal ideation   ? Hyperlipemia   ? Hypertension   ? Hypothyroidism   ? IBS (irritable bowel syndrome)   ? Memory changes   ? Obesity   ? Sleep apnea   ? HAS C -PAP / DOES NOT USE  ? Stress incontinence   ? Pt had surgery to correct this.  ? Tachycardia   ? Tobacco abuse   ? Tremor   ? UTI (lower urinary tract infection)   ? ?Past Surgical History:  ?Procedure Laterality  Date  ? INCONTINENCE SURGERY    ? NASAL FRACTURE SURGERY    ? ovary removed    ? OVARY SURGERY    ? PUBOVAGINAL SLING  08/16/2011  ? Procedure: [name];  Surgeon: Bernestine Amass, MD;  Location: [facility];  Service: Urology;  Laterality: N/A;   ? ? ? ? ?  ? [place]  2001  ? ?Social History:  reports that she quit smoking about 10 years ago. Her smoking use included cigarettes. She has a 15.00 pack-year smoking history. She has never used smokeless tobacco. She reports that she does not drink alcohol and does not use drugs. ? ?Allergies  ?Allergen Reactions  ? Ciprofloxacin Swelling and Other (See Comments)  ?  Per pt caused lips swell and nauseous feeling  ? Levaquin [Levofloxacin] Swelling and Other (See Comments)  ?  Per pt caused lips swell and nauseous feeling  ? Buspar [Buspirone] Other (See Comments)  ?  abd cramping  ? Linaclotide Other (See Comments)  ? Advair Diskus [Fluticasone-Salmeterol] Other (See Comments)  ?  Thrush   ? Biaxin [Clarithromycin] Rash  ? Hydroxyzine Palpitations  ? ? ?Family History  ?Problem Relation Age of Onset  ? Asthma Mother   ? Bipolar disorder Mother   ?  Heart disease Father   ? Lymphoma Father   ? Hypertension Father   ? Thyroid disease Father   ? Hyperlipidemia Father   ? Diabetes Father   ? Cancer Paternal Grandmother   ?     lung and breast  ? Bladder Cancer Paternal Grandfather   ? Suicidality Maternal Grandfather   ? Thyroid disease Brother   ? ? ?Prior to Admission medications   ?Medication Sig Start Date End Date Taking? Authorizing Provider  ?amLODipine (NORVASC) 5 MG tablet Take 1 tablet (5 mg total) by mouth daily. 04/15/21 04/10/22 Yes Buford Dresser, MD  ?APPLE CIDER VINEGAR PO Take 1 tablet by mouth daily.   Yes [provider]  ?aspirin EC 81 MG tablet Take 81 mg by mouth at bedtime.    Yes [provider]  ?buPROPion (WELLBUTRIN XL) 300 MG 24 hr tablet Take 1 tablet (300 mg total) by mouth daily. 06/29/21 12/26/21 Yes  Norman Clay, MD  ?carvedilol (COREG) 25 MG tablet Take 12.5 mg by mouth 2 (two) times daily with a meal.   Yes [provider]  ?Cholecalciferol 50 MCG (2000 UT) TABS Take 1 tablet by mouth daily.  07/26/18  Yes [provider]  ?Coenzyme Q10 (COQ-10 PO) Take 1 capsule by mouth daily.   Yes [provider]  ?dicyclomine (BENTYL) 10 MG capsule Take 10 mg by mouth 3 (three) times daily as needed for spasms.   Yes [provider]  ?DULoxetine (CYMBALTA) 60 MG capsule Take 1 capsule (60 mg total) by mouth daily. 09/27/21 01/25/22 Yes Norman Clay, MD  ?fluticasone (FLONASE) 50 MCG/ACT nasal spray Place 2 sprays into the nose daily as needed for allergies.    Yes [provider]  ?folic acid (FOLVITE) 882 MCG tablet Take 400 mcg by mouth every evening.    Yes [provider]  ?furosemide (LASIX) 20 MG tablet Take 20 mg by mouth daily.    Yes [provider]  ?gabapentin (NEURONTIN) 300 MG capsule Take 300 mg by mouth 2 (two) times daily. One capsule in am and 2 capsules in evening 02/25/20  Yes [provider]  ?insulin glargine (LANTUS) 100 UNIT/ML injection Inject 0.5 mLs (50 Units total) into the skin daily. ?Patient taking differently: Inject 50 Units into the skin daily as needed (when pump is not working). 09/04/18  Yes Derrill Center, NP  ?lamoTRIgine (LAMICTAL) 200 MG tablet Take 1 tablet (200 mg total) by mouth daily. 09/27/21 01/25/22 Yes Hisada, Elie Goody, MD  ?levothyroxine (SYNTHROID, LEVOTHROID) 150 MCG tablet Take 150 mcg by mouth daily before breakfast.   Yes [provider]  ?LORazepam (ATIVAN) 0.5 MG tablet Take 1 tablet (0.5 mg total) by mouth daily as needed for anxiety. 07/02/21 09/30/21 Yes Hisada, Elie Goody, MD  ?NOVOLOG 100 UNIT/ML injection Inject 2.425-2.95 Units into the skin continuous. Via pump ?7pm to midnight 2.950 ?8:30am -7pm2.925 ?Midnight - 8:30 2.425 02/24/17  Yes [provider]  ?Omega-3 Fatty Acids (FISH OIL  PO) Take 1 capsule by mouth daily.   Yes [provider]  ?omeprazole (PRILOSEC) 20 MG capsule Take 20 mg by mouth at bedtime.    Yes [provider]  ?ondansetron (ZOFRAN ODT) 4 MG disintegrating tablet Take 1 tablet (4 mg total) by mouth every 8 (eight) hours as needed. 03/23/21  Yes Marcial Pacas, MD  ?oxyCODONE (OXY IR/ROXICODONE) 5 MG immediate release tablet Take 5 mg by mouth 4 (four) times daily as needed. 11/27/20  Yes [provider]  ?  OXYGEN Inhale 3 L into the lungs as needed.   Yes [provider]  ?pravastatin (PRAVACHOL) 80 MG tablet Take 80 mg by mouth at bedtime.   Yes [provider]  ?PROAIR HFA 108 (90 Base) MCG/ACT inhaler Inhale 1-2 puffs into the lungs every 6 (six) hours as needed for wheezing or shortness of breath.  12/30/16  Yes [provider]  ?Continuous Blood Gluc Sensor MISC 1 each by Does not apply route as directed. Use as directed every 14 days. May dispense FreeStyle [employer] or similar.    [provider]  ?desipramine (NORPRAMIN) 25 MG tablet Take 1 tablet (25 mg total) by mouth daily. 07/30/21 08/29/21  Norman Clay, MD  ?glucagon (GLUCAGEN HYPOKIT) 1 MG SOLR injection GlucaGen HypoKit 1 mg Injection 03/13/20   [provider]  ?rizatriptan (MAXALT) 10 MG tablet Take 1 tablet (10 mg total) by mouth as needed for migraine. May repeat in 2 hours if needed 03/23/21   Marcial Pacas, MD  ? ? ?Physical Exam: ?Vitals:  ? 09/08/21 2000 09/09/21 0030 09/09/21 0050 09/09/21 0116  ?BP: (!) 165/84 (!) 179/76 (!) 153/77 (!) 110/58  ?Pulse: 81 87 89 78  ?Resp: 17 (!) 25 (!) 26 (!) 26  ?Temp:      ?TempSrc:      ?SpO2: (!) 87% 95% 95% 95%  ?Weight:      ?Height:      ? ?1.  General: ?Patient lying supine in bed,  no acute distress ?  ?2. Psychiatric: ?Alert and oriented x 3, mood and behavior normal for situation, pleasant and cooperative with exam ?  ?3. Neurologic: ?Speech and language are normal, face is symmetric,  moves all 4 extremities voluntarily, at baseline without acute deficits on limited exam ?  ?4. HEENMT:  ?Head is atraumatic, normocephalic, pupils reactive to light, neck is supple, trachea is midline, muco

## 2021-09-09 NOTE — Assessment & Plan Note (Signed)
Continue Protonix °

## 2021-09-09 NOTE — Assessment & Plan Note (Signed)
Reduce basal insulin from 50 units to 30 ?Sliding scale coverage ?Carb modified diet ?Update hemoglobin A1c ?Continue to monitor ?

## 2021-09-09 NOTE — Assessment & Plan Note (Addendum)
TSH 10.434 ?Increase synthroid to 175 mcg daily ?

## 2021-09-09 NOTE — Assessment & Plan Note (Signed)
Start Imitrex ?Blood pressure control ?Decrease stimuli ?Continue to monitor ?

## 2021-09-09 NOTE — Assessment & Plan Note (Addendum)
Secondary to volume depletion and hyperglycemia ?-Continue IV fluids>>improved ?

## 2021-09-09 NOTE — Assessment & Plan Note (Addendum)
Holding Maxalt temporarily secondary to elevated blood pressure ?4/6--no headache presently ?

## 2021-09-09 NOTE — ED Notes (Signed)
Pt linens and gown have been changed. Pt helped to reposition in bed.  ?

## 2021-09-09 NOTE — Progress Notes (Signed)
Inpatient Diabetes Program Recommendations ? ?AACE/ADA: New Consensus Statement on Inpatient Glycemic Control  ?Target Ranges:  Prepandial:   less than 140 mg/dL ?     Peak postprandial:   less than 180 mg/dL (1-2 hours) ?     Critically ill patients:  140 - 180 mg/dL  ? ? Latest Reference Range & Units 09/09/21 04:10 09/09/21 04:16 09/09/21 06:37 09/09/21 07:18 09/09/21 07:54  ?Glucose-Capillary 70 - 99 mg/dL >600 (HH) >600 (HH) 514 (HH) 515 (HH) 542 (HH)  ? ? Latest Reference Range & Units 09/09/21 03:40  ?CO2 22 - 32 mmol/L 18 (L)  ?Glucose 70 - 99 mg/dL 796 (HH)  ?Anion gap 5 - 15  16 (H)  ? ?Review of Glycemic Control ? ?Diabetes history: DM ?Outpatient Diabetes medications: Insulin Pump with Novolog ?Current orders for Inpatient glycemic control: IV insulin per DKA ? ?Inpatient Diabetes Program Recommendations:   ? ?Insulin: IV insulin should be continued until acidosis has completely resolved. Once acidosis is resolved and provider is ready to transition from IV to SQ insulin, please consider ordering Levemir 20 units BID, CBGs Q4H, Novolog 0-15 units Q4H. ? ?NOTE: Patient is currently in the ED and being admitted with DKA; initial lab glucose 796 mg/dl on 09/09/21 on IV insulin drip. Per chart review, patient uses an insulin pump with Novolog for DM control. Patient sees Dr. [name] ([facility]) and was last seen 10/07/20. Per office note on 10/07/20, patient uses a T-slim insulin pump with Dexcom CGM and A1C was 6.8% on 10/07/20. Per current home medication list insulin pump basal rates are: ?12A      2.425 ?8:30A   2.925 ?7P        2.950 ?Total Basal: 66.075 units/day ? ?Tried to call patient over her cell phone and on room phone in ED room but no answer. Communicated with Jinny Blossom, RN and she has checked patient to be sure patient does not have insulin pump or Dexcom CGM on at this time. RN reports that patient is confused and lethargic and would not be able to have discussion over the phone.  Will continue to follow and will plan to speak with patient when appropriate. ?Thanks, ?Barnie Alderman, RN, MSN, CDE ?Diabetes Coordinator ?Inpatient Diabetes Program ?[phone] (Team Pager from 8am to 5pm) ? ? ?

## 2021-09-09 NOTE — Assessment & Plan Note (Addendum)
Continue Cymbalta, Lamictal, bupropion ?Continue Ativan as needed ?Hold cymbalta and lamictal temporarily>>restarted ?

## 2021-09-09 NOTE — Assessment & Plan Note (Addendum)
Baseline creatinine 2.7-3.0 ?Presented with serum creatinine 3.57 ?Secondary to volume depletion ?Continue IV fluids ?-suspect patient has new baseline now 3.5-3.8 ?-renal US--no hydronephrosis ?-outpt nephrology follow up ?

## 2021-09-09 NOTE — Hospital Course (Addendum)
51 year old female with a history of insulin-dependent diabetes mellitus, CKD stage IV, migraine headaches, fibromyalgia, bipolar type II disorder, hyperlipidemia, hypothyroidism presenting with 1 day history of nausea, vomiting, and dizziness.  The patient also had an associated bifrontal headache.  She stated that she was not able to tolerate her antihypertensive medications.  However she stated that she last took her amlodipine on 09/07/2021.  In the emergency department, the patient was noted to be hypertensive and initially given intravenous metoprolol and hydralazine.  Although her BP initially improved, it climbed back up for which the patient was started on intravenous Cardene.  She was initially given 2 L of IV fluids.  routine blood work subsequently showed that the patient had a serum glucose of 796 with anion gap of 16.  She was started on IV fluids and IV insulin. ?The patient is a difficult historian.  However, she stated that she has not been completely compliant with her antihypertensive medications nor her insulin.  She is not able to elaborate how often she misses her medications.  She denies any chest pain, shortness breath, abdominal pain, diarrhea, hematemesis, hematochezia, melena. ?Blood work showed sodium 120, potassium 5.9, bicarbonate 18, serum creatinine 3.57.  LFTs were unremarkable.  WBC 15.3, hemoglobin 12.7, platelets 200,000.  She was started on IVF and IV insulin.  BP meds adjustments instituted to wean off cardene drip. ?

## 2021-09-09 NOTE — Assessment & Plan Note (Signed)
Blood pressure as high as 197/85 ?Restart home medications ?Start Cardene drip ?Patient is symptomatic with headache, dizziness, AKI ?Continue to monitor ?

## 2021-09-09 NOTE — Progress Notes (Signed)
Page for point of glucose reading greater than 600.  Stat BMP ordered to confirm.  It was checked on glucometer again and still read over 600.  15 units NovoLog given.  Stat BMP shows glucose 796, bicarb 18, gap 16.  Patient started on insulin drip, VBG ordered to assess pH.  Very likely contributing to her dizziness and headache.  Initial BMP only showed a glucose of 217. ?

## 2021-09-09 NOTE — Assessment & Plan Note (Addendum)
4/5 hemoglobin A1c--6.7 ?Transition to [facility]  ?Asked patient to have spouse bring in insulin pump and Dexcom CGM ?Moderate novolog sliding scale ?After discussion with patient/spouse and DM coordinator, ptspouse both appear to have poor insight regarding ability to use insulin pump ?Also patient has not follow up with endocrine since May 2022 ?As such, pt will d/c home with levemir 20 units bid with lispro sliding scale ?

## 2021-09-10 ENCOUNTER — Inpatient Hospital Stay (HOSPITAL_COMMUNITY): Payer: BC Managed Care – PPO

## 2021-09-10 DIAGNOSIS — E0810 Diabetes mellitus due to underlying condition with ketoacidosis without coma: Secondary | ICD-10-CM | POA: Diagnosis not present

## 2021-09-10 DIAGNOSIS — R519 Headache, unspecified: Secondary | ICD-10-CM

## 2021-09-10 DIAGNOSIS — N179 Acute kidney failure, unspecified: Secondary | ICD-10-CM | POA: Diagnosis not present

## 2021-09-10 DIAGNOSIS — E871 Hypo-osmolality and hyponatremia: Secondary | ICD-10-CM | POA: Diagnosis not present

## 2021-09-10 LAB — URINALYSIS, COMPLETE (UACMP) WITH MICROSCOPIC
Bilirubin Urine: NEGATIVE
Glucose, UA: >=500 mg/dL — AB
Hgb urine dipstick: NEGATIVE
Ketones, ur: NEGATIVE mg/dL
Leukocytes,Ua: NEGATIVE
Nitrite: NEGATIVE
Protein, ur: >=300 mg/dL — AB
Renal Epithelial: 1
Specific Gravity, Urine: 1.01 (ref 1.005–1.030)
pH: 8 (ref 5.0–8.0)

## 2021-09-10 LAB — VITAMIN D 25 HYDROXY (VIT D DEFICIENCY, FRACTURES): Vit D, 25-Hydroxy: 39.83 ng/mL (ref 30–100)

## 2021-09-10 LAB — COMPREHENSIVE METABOLIC PANEL
ALT: 15 U/L (ref 0–44)
AST: 16 U/L (ref 15–41)
Albumin: 2.8 g/dL — ABNORMAL LOW (ref 3.5–5.0)
Alkaline Phosphatase: 82 U/L (ref 38–126)
Anion gap: 7 (ref 5–15)
BUN: 44 mg/dL — ABNORMAL HIGH (ref 6–20)
CO2: 20 mmol/L — ABNORMAL LOW (ref 22–32)
Calcium: 10.2 mg/dL (ref 8.9–10.3)
Chloride: 107 mmol/L (ref 98–111)
Creatinine, Ser: 3.96 mg/dL — ABNORMAL HIGH (ref 0.44–1.00)
GFR, Estimated: 13 mL/min — ABNORMAL LOW (ref >60)
Glucose, Bld: 109 mg/dL — ABNORMAL HIGH (ref 70–99)
Potassium: 4 mmol/L (ref 3.5–5.1)
Sodium: 134 mmol/L — ABNORMAL LOW (ref 135–145)
Total Bilirubin: 0.6 mg/dL (ref 0.3–1.2)
Total Protein: 5.7 g/dL — ABNORMAL LOW (ref 6.5–8.1)

## 2021-09-10 LAB — BLOOD GAS, ARTERIAL
Acid-base deficit: 1.7 mmol/L (ref 0.0–2.0)
Bicarbonate: 24.7 mmol/L (ref 20.0–28.0)
Drawn by: 41977
FIO2: 36 %
O2 Saturation: 98.2 %
Patient temperature: 37
pCO2 arterial: 48 mmHg (ref 32–48)
pH, Arterial: 7.32 — ABNORMAL LOW (ref 7.35–7.45)
pO2, Arterial: 80 mmHg — ABNORMAL LOW (ref 83–108)

## 2021-09-10 LAB — AMMONIA: Ammonia: 25 umol/L (ref 9–35)

## 2021-09-10 LAB — VITAMIN B12: Vitamin B-12: 423 pg/mL (ref 180–914)

## 2021-09-10 LAB — CBC WITH DIFFERENTIAL/PLATELET
Abs Immature Granulocytes: 0.01 10*3/uL (ref 0.00–0.07)
Basophils Absolute: 0.1 10*3/uL (ref 0.0–0.1)
Basophils Relative: 2 %
Eosinophils Absolute: 0.4 10*3/uL (ref 0.0–0.5)
Eosinophils Relative: 6 %
HCT: 36 % (ref 36.0–46.0)
Hemoglobin: 11.5 g/dL — ABNORMAL LOW (ref 12.0–15.0)
Immature Granulocytes: 0 %
Lymphocytes Relative: 23 %
Lymphs Abs: 1.7 10*3/uL (ref 0.7–4.0)
MCH: 28.6 pg (ref 26.0–34.0)
MCHC: 31.9 g/dL (ref 30.0–36.0)
MCV: 89.6 fL (ref 80.0–100.0)
Monocytes Absolute: 0.9 10*3/uL (ref 0.1–1.0)
Monocytes Relative: 13 %
Neutro Abs: 4.2 10*3/uL (ref 1.7–7.7)
Neutrophils Relative %: 56 %
Platelets: 278 10*3/uL (ref 150–400)
RBC: 4.02 MIL/uL (ref 3.87–5.11)
RDW: 14.6 % (ref 11.5–15.5)
WBC: 7.4 10*3/uL (ref 4.0–10.5)
nRBC: 0 % (ref 0.0–0.2)

## 2021-09-10 LAB — GLUCOSE, CAPILLARY
Glucose-Capillary: 417 mg/dL — ABNORMAL HIGH (ref 70–99)
Glucose-Capillary: 303 mg/dL — ABNORMAL HIGH (ref 70–99)
Glucose-Capillary: 121 mg/dL — ABNORMAL HIGH (ref 70–99)
Glucose-Capillary: 33 mg/dL — CL (ref 70–99)
Glucose-Capillary: 117 mg/dL — ABNORMAL HIGH (ref 70–99)
Glucose-Capillary: 195 mg/dL — ABNORMAL HIGH (ref 70–99)

## 2021-09-10 LAB — MAGNESIUM: Magnesium: 1.9 mg/dL (ref 1.7–2.4)

## 2021-09-10 LAB — TROPONIN I (HIGH SENSITIVITY)
Troponin I (High Sensitivity): 1366 ng/L (ref <18)
Troponin I (High Sensitivity): 1217 ng/L (ref <18)

## 2021-09-10 LAB — HEMOGLOBIN A1C
Hgb A1c MFr Bld: 6.5 % — ABNORMAL HIGH (ref 4.8–5.6)
Mean Plasma Glucose: 140 mg/dL

## 2021-09-10 LAB — FOLATE: Folate: 37.4 ng/mL (ref >5.9)

## 2021-09-10 MED ORDER — HEPARIN (PORCINE) 25000 UT/250ML-% IV SOLN
1300.0000 [IU]/h | INTRAVENOUS | Status: DC
  Administered 2021-09-10: 1300 [IU]/h via INTRAVENOUS
  Filled 2021-09-10: qty 250

## 2021-09-10 MED ORDER — HEPARIN BOLUS VIA INFUSION
4000.0000 [IU] | Freq: Once | INTRAVENOUS | Status: AC
  Administered 2021-09-10: 4000 [IU] via INTRAVENOUS
  Filled 2021-09-10: qty 4000

## 2021-09-10 MED ORDER — CARVEDILOL 12.5 MG PO TABS
25.0000 mg | ORAL_TABLET | Freq: Two times a day (BID) | ORAL | Status: DC
  Administered 2021-09-10 – 2021-09-12 (×5): 25 mg via ORAL
  Filled 2021-09-10 (×5): qty 2

## 2021-09-10 MED ORDER — INSULIN ASPART 100 UNIT/ML IJ SOLN
20.0000 [IU] | Freq: Once | INTRAMUSCULAR | Status: AC
  Administered 2021-09-10: 20 [IU] via SUBCUTANEOUS

## 2021-09-10 NOTE — Progress Notes (Signed)
?  ?       ?PROGRESS NOTE ? ?Jasmine [name] EQA:[id number] DOB: December 04, 1970 DOA: 09/08/2021 ?PCP: Aletha Halim., PA-C ? ?Brief History:  ?51 year old female with a history of insulin-dependent diabetes mellitus, CKD stage IV, migraine headaches, fibromyalgia, bipolar type II disorder, hyperlipidemia, hypothyroidism presenting with 1 day history of nausea, vomiting, and dizziness.  The patient also had an associated bifrontal headache.  She stated that she was not able to tolerate her antihypertensive medications.  However she stated that she last took her amlodipine on 09/07/2021.  In the emergency department, the patient was noted to be hypertensive and initially given intravenous metoprolol and hydralazine.  Although her BP initially improved, it climbed back up for which the patient was started on intravenous Cardene.  She was initially given 2 L of IV fluids.  routine blood work subsequently showed that the patient had a serum glucose of 796 with anion gap of 16.  She was started on IV fluids and IV insulin. ?The patient is a difficult historian.  However, she stated that she has not been completely compliant with her antihypertensive medications nor her insulin.  She is not able to elaborate how often she misses her medications.  She denies any chest pain, shortness breath, abdominal pain, diarrhea, hematemesis, hematochezia, melena. ?Blood work showed sodium 120, potassium 5.9, bicarbonate 18, serum creatinine 3.57.  LFTs were unremarkable.  WBC 15.3, hemoglobin 12.7, platelets 200,000.  She was started on IVF and IV insulin.  BP meds adjustments instituted to wean off cardene drip.  ? ? ? ?Assessment and Plan: ?DKA (diabetic ketoacidosis) ([place]) ?-patient started on IV insulin with q 1 hour CBG check and q 4 hour BMPs ?-pt started on aggressive fluid resuscitation ?-Electrolytes were monitored and repleted ?-transitioned to [place] insulin once anion gap closed ?-diet was advanced once anion gap  closed ?09/09/21-HbA1C--6.7 ? ? ?Acute renal failure superimposed on stage 4 chronic kidney disease ([place]) ?Baseline creatinine 2.7-3.0 ?Presented with serum creatinine 3.57 ?Secondary to volume depletion ?Continue IV fluids ?-suspect patient has new baseline now ?-renal US ? ?Uncontrolled diabetes mellitus with hyperglycemia, with long-term current use of insulin (H[facility]) ?4/5 hemoglobin A1c--6.7 ?Transition to [facility]  ?Asked patient to have spouse bring in insulin pump and Dexcom CGM ?Moderate novolog sliding scale ? ?HTN (hypertension) ?Restart carvedilol, amlodipine ?-increase coreg to 25 mg bid ?-wean cardene drip for SBP <160 ? ?Hypercalcemia ?Intact PTH ?Corrected calcium 11.0 at time of admission ?25 vitamin D ?1,25 vit D ? ?Mixed hyperlipidemia ?Continue statin ? ?GERD (gastroesophageal reflux disease) ?Continue Protonix ? ?Chronic migraine w/o aura w/o status migrainosus, not intractable ?Holding Maxalt temporarily secondary to elevated blood pressure ?4/6--no headache presently ? ?Bipolar II disorder ([place]) ?Continue Cymbalta, Lamictal, bupropion ?Continue Ativan as needed ? ?Obesity, Class III, BMI 40-49.9 (morbid obesity) ([place]) ?BMI 48.09 ?Lifestyle modification ? ?Hypothyroidism ?TSH 10.434 ?Given the patient's questionable compliance, continue current dose of Synthroid ? ?Hyponatremia ?Secondary to volume depletion and hyperglycemia ?-Continue IV fluids>>improved ? ? ? ?Status is: inpatient ?The patient will require care spanning > 2 midnights and should be moved to inpatient because: severity of illness requiring IV insulin drip ?  ?  ?  ?Family Communication:  no Family at bedside ?  ?Consultants:  none ?  ?Code Status:  FULL ?  ?DVT Prophylaxis:  [place] Heparin ?  ?  ?Procedures: ?As Listed in Progress Note Above ?  ?Antibiotics: ?None ?  ? ? ? ? ? ? ? ? ? ? ?Subjective: ?Patient  denies fevers, chills, headache, chest pain, dyspnea, nausea, vomiting, diarrhea, abdominal pain, dysuria, hematuria,  hematochezia, and melena. ? ? ?Objective: ?Vitals:  ? 09/10/21 0630 09/10/21 0645 09/10/21 0700 09/10/21 0746  ?BP: (!) 135/59 (!) 130/53 127/65   ?Pulse: 84 81 85   ?Resp: 17 17 (!) 25   ?Temp:    98.4 ?F (36.9 ?C)  ?TempSrc:    Oral  ?SpO2: 92% (!) 87% (!) 89%   ?Weight:      ?Height:      ? ? ?Intake/Output Summary (Last 24 hours) at 09/10/2021 4098 ?Last data filed at 09/09/2021 2200 ?Gross per 24 hour  ?Intake 2904 ml  ?Output 700 ml  ?Net 2204 ml  ? ?Weight change: -1.051 kg ?Exam: ? ?General:  Pt is alert, follows commands appropriately, not in acute distress ?HEENT: No icterus, No thrush, No neck mass, [place]/AT ?Cardiovascular: RRR, S1/S2, no rubs, no gallops ?Respiratory: bibasilar rales. No wheeze ?Abdomen: Soft/+BS, non tender, non distended, no guarding ?Extremities: No edema, No lymphangitis, No petechiae, No rashes, no synovitis ? ? ?Data Reviewed: ?I have personally reviewed following labs and imaging studies ?Basic Metabolic Panel: ?Recent Labs  ?Lab 09/08/21 ?1557 09/09/21 ?1191 09/09/21 ?4782 09/09/21 ?9562 09/09/21 ?1517  ?NA 136 128* 129* 132* 132*  ?K 4.1 5.9* 4.9 4.4 4.2  ?CL 100 94* 94* 98 99  ?CO2 26 18* 17* 21* 24  ?GLUCOSE 217* 796* 668* 390* 169*  ?BUN 26* 36* 40* 40* 39*  ?CREATININE 3.33* 3.57* 3.82* 3.55* 3.62*  ?CALCIUM 11.4* 10.2 10.5* 10.3 10.5*  ?MG  --  1.8  --   --   --   ? ?Liver Function Tests: ?Recent Labs  ?Lab 09/08/21 ?1557 09/09/21 ?0340  ?AST 16 13*  ?ALT 13 15  ?ALKPHOS 99 99  ?BILITOT 0.5 1.3*  ?PROT 7.0 6.5  ?ALBUMIN 3.2* 3.0*  ? ?No results for input(s): LIPASE, AMYLASE in the last 168 hours. ?No results for input(s): AMMONIA in the last 168 hours. ?Coagulation Profile: ?No results for input(s): INR, PROTIME in the last 168 hours. ?CBC: ?Recent Labs  ?Lab 09/08/21 ?1557 09/09/21 ?0340  ?WBC 9.5 15.3*  ?NEUTROABS 6.5 13.6*  ?HGB 13.8 12.7  ?HCT 43.5 41.4  ?MCV 87.7 91.2  ?PLT 297 300  ? ?Cardiac Enzymes: ?No results for input(s): CKTOTAL, CKMB, CKMBINDEX, TROPONINI in the last  168 hours. ?BNP: ?Invalid input(s): POCBNP ?CBG: ?Recent Labs  ?Lab 09/09/21 ?1539 09/09/21 ?1737 09/09/21 ?1919 09/09/21 ?2148 09/10/21 ?0747  ?GLUCAP 146* 169* 159* 176* 417*  ? ?HbA1C: ?Recent Labs  ?  09/09/21 ?1308 09/09/21 ?6578  ?HGBA1C 6.5* 6.7*  ? ?Urine analysis: ?   ?Component Value Date/Time  ? COLORURINE YELLOW 09/08/2021 1528  ? APPEARANCEUR CLOUDY (A) 09/08/2021 1528  ? LABSPEC 1.010 09/08/2021 1528  ? PHURINE 9.0 (H) 09/08/2021 1528  ? GLUCOSEU 50 (A) 09/08/2021 1528  ? HGBUR NEGATIVE 09/08/2021 1528  ? [place] NEGATIVE 09/08/2021 1528  ? [place] NEGATIVE 09/08/2021 1528  ? PROTEINUR >=300 (A) 09/08/2021 1528  ? UROBILINOGEN 0.2 03/19/2015 1525  ? NITRITE NEGATIVE 09/08/2021 1528  ? LEUKOCYTESUR NEGATIVE 09/08/2021 1528  ? ?Sepsis Labs: ?@[email or web address] (procalcitonin:4,lacticidven:4) ?) ?Recent Results (from the past 240 hour(s))  ?Resp Panel by RT-PCR (Flu A&B, Covid) Nasopharyngeal Swab     Status: None  ? Collection Time: 09/09/21 10:38 AM  ? Specimen: Nasopharyngeal Swab; Nasopharyngeal(NP) swabs in vial transport medium  ?Result Value Ref Range Status  ? SARS Coronavirus 2 by RT PCR NEGATIVE NEGATIVE Final  ?  Comment: (NOTE) ?SARS-CoV-2 target nucleic acids are NOT DETECTED. ? ?The SARS-CoV-2 RNA is generally detectable in upper respiratory ?specimens during the acute phase of infection. The lowest ?concentration of SARS-CoV-2 viral copies this assay can detect is ?138 copies/mL. A negative result does not preclude SARS-Cov-2 ?infection and should not be used as the sole basis for treatment or ?other patient management decisions. A negative result may occur with  ?improper specimen collection/handling, submission of specimen other ?than nasopharyngeal swab, presence of viral mutation(s) within the ?areas targeted by this assay, and inadequate number of viral ?copies(<138 copies/mL). A negative result must be combined with ?clinical observations, patient history, and  epidemiological ?information. The expected result is Negative. ? ?Fact Sheet for Patients:  ?EntrepreneurPulse.com.au ? ?Fact Sheet for Healthcare Providers:  ?IncredibleEmployment.be ? ?This test is no t yet approved or

## 2021-09-10 NOTE — Progress Notes (Addendum)
Responded to nursing call:  altered mental status ?Initially CBG was 39.  D50 given with improvement to 121 ? ?Subjective: ?Patient arousable and answers questions appropriately but falls asleep without verbal stimulation.  She denies cp, sob, abd pain, headache. ? ?Vitals:  ? 09/10/21 0800 09/10/21 0900 09/10/21 1100 09/10/21 1600  ?BP: (!) 148/86 (!) 149/122    ?Pulse: 88 93    ?Resp: 18     ?Temp:   98.4 ?F (36.9 ?C) 98 ?F (36.7 ?C)  ?TempSrc:   Oral Axillary  ?SpO2: 91% 99%    ?Weight:      ?Height:      ? ?CV--RRR ?Lung--bibasilar crackles. ?Abd--soft+BS/NT ?Neuro:  CN II-XII intact, strength 3/5 in RUE, RLE, strength 3/5 LUE, LLE; sensation intact bilateral; no dysmetria; babinski equivocal ? ? ? ?Assessment/Plan: ?Acute encephalopathy ?-check ABG ?-BMP ?-ammonia ?-EKG--personally reviewed--sinus, nonspecific TWI V1 and V2 ?-CBC ?-UA/culture ?-CT brain ? ? ? ? ?Orson Eva, DO ?[facility] ? ?

## 2021-09-10 NOTE — Assessment & Plan Note (Addendum)
Intact PTH--10 ?Corrected calcium 11.0 at time of admission ?25 vitamin D--39.83 ?1,25 vit D--54.8 ?Outpatient endocrine follow up ?

## 2021-09-10 NOTE — Progress Notes (Addendum)
ANTICOAGULATION CONSULT NOTE - Initial Consult ? ?Pharmacy Consult for heparin ?Indication: chest pain/ACS ? ?Allergies  ?Allergen Reactions  ? Ciprofloxacin Swelling and Other (See Comments)  ?  Per pt caused lips swell and nauseous feeling  ? Levaquin [Levofloxacin] Swelling and Other (See Comments)  ?  Per pt caused lips swell and nauseous feeling  ? Buspar [Buspirone] Other (See Comments)  ?  abd cramping  ? Linaclotide Other (See Comments)  ? Advair Diskus [Fluticasone-Salmeterol] Other (See Comments)  ?  Thrush   ? Biaxin [Clarithromycin] Rash  ? Hydroxyzine Palpitations  ? ? ?Patient Measurements: ?Height: 5\' 9"  (175.3 cm) ?Weight: (!) 147.7 kg (325 lb 9.9 oz) ?IBW/kg (Calculated) : 66.2 ?Heparin Dosing Weight: 101kg ? ?Vital Signs: ?Temp: 98 ?F (36.7 ?C) (04/06 1600) ?Temp Source: Axillary (04/06 1600) ?BP: 149/122 (04/06 0900) ?Pulse Rate: 93 (04/06 0900) ? ?Labs: ?Recent Labs  ?  09/08/21 ?1557 09/09/21 ?3664 09/09/21 ?4034 09/09/21 ?7425 09/09/21 ?1517 09/10/21 ?1701  ?HGB 13.8 12.7  --   --   --  11.5*  ?HCT 43.5 41.4  --   --   --  36.0  ?PLT 297 300  --   --   --  278  ?CREATININE 3.33* 3.57*   < > 3.55* 3.62* 3.96*  ?TROPONINIHS  --   --   --   --   --  1,366*  ? < > = values in this interval not displayed.  ? ? ?Estimated Creatinine Clearance: 26.8 mL/min (A) (by C-G formula based on SCr of 3.96 mg/dL (H)). ? ? ?Medical History: ?Past Medical History:  ?Diagnosis Date  ? Anxiety   ? Arthritis   ? Asthma   ? Balance problems   ? Bipolar disorder ([place])   ? Charcot ankle   ? Chronic fatigue   ? 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 Depression   ? Diabetes mellitus   ? DKA, type 1 ([place]) 11/04/2011  ? Elevated cholesterol   ? Fibromyalgia   ? GERD (gastroesophageal reflux disease)   ? Headache   ? History of suicidal ideation   ? Hyperlipemia   ? Hypertension   ? Hypothyroidism   ? IBS (irritable bowel syndrome)   ? Memory changes   ? Obesity   ? Sleep apnea   ? HAS C -PAP / DOES NOT USE  ? Stress  incontinence   ? Pt had surgery to correct this.  ? Tachycardia   ? Tobacco abuse   ? Tremor   ? UTI (lower urinary tract infection)   ? ?  ?Assessment: ?51 yo W with elevated troponin and TWI but no chest pain or SOB. No anticoagulation prior to admission. Pharmacy consulted for heparin.   ? ?Last received subcutaneous heparin at 5am  ? ?Goal of Therapy:  ?Heparin level 0.3-0.7 units/ml ?Monitor platelets by anticoagulation protocol: Yes ?  ?Plan:  ?Heparin 4000 unit bolus x1 then 1300 units/hr ?Monitor daily heparin level, CBC ?Monitor for signs/symptoms of bleeding  ? ? ?Jasmine [name], PharmD, BCPS, BCCP ?Clinical Pharmacist ? ?Please check AMION for all [facility] phone numbers ?After 10:00 PM, call [place] [phone] ? ? ?

## 2021-09-10 NOTE — Progress Notes (Signed)
?  Transition of Care (TOC) Screening Note ? ? ?Patient Details  ?Name: Jasmine [name] ?Date of Birth: 26-May-1971 ? ? ?Transition of Care (TOC) CM/SW Contact:    ?Ashleen Demma D, LCSW ?Phone Number: ?09/10/2021, 3:55 PM ? ? ? ?Transition of Care Department [facility]) has reviewed patient and no TOC needs have been identified at this time. We will continue to monitor patient advancement through interdisciplinary progression rounds. If new patient transition needs arise, please place a TOC consult. ?  ?

## 2021-09-10 NOTE — Progress Notes (Signed)
Obstructive sleep apnea on CPAP ?RN called due to patient requiring for CPAP due to history of OSA and use of CPAP at home.  CPAP was ordered. ?

## 2021-09-10 NOTE — Assessment & Plan Note (Signed)
BMI 48.09 ?Lifestyle modification ?

## 2021-09-10 NOTE — Progress Notes (Signed)
Urine sample obtained via I&O cath as ordered, 370mL urine return, sample collected & taken to lab  ?

## 2021-09-10 NOTE — Progress Notes (Signed)
Date and time results received: 09/10/21 1819 ?(use smartphrase ".now" to insert current time) ? ?Test: Troponin ?Critical Value: 1,366 ? ?Name of Provider Notified: TAT,MD. ? ?Orders Received? Or Actions Taken?:  Awaiting further orders. ?

## 2021-09-10 NOTE — Assessment & Plan Note (Signed)
Continue statin. 

## 2021-09-10 NOTE — Progress Notes (Signed)
ABG successfully drawn and delivered to lab at 1643. ?

## 2021-09-10 NOTE — Progress Notes (Signed)
Notified by RN about troponin 1366 ?Patient denies cp, sob ?Personally reviewed EKG--sinus, nonspecific TWI V1-V2 ?Pt is poor cath candidate with CKD4 ?Started IV heparin ?Trend troponins ?Echo in am ?Discussed with cardiology, Dr. Johney Frame ? ?Orson Eva, DO ?

## 2021-09-10 NOTE — Progress Notes (Signed)
CBG 33, patient able to arouse & reported that she was ok, D50 given via IV,MD notified  ?

## 2021-09-10 NOTE — Progress Notes (Signed)
Per MD notified patient's husband to request the the bring patient's insulin pump to the patient & to wean patient off Cardene gtt when SBP maintains < 160 ?

## 2021-09-10 NOTE — Progress Notes (Addendum)
Inpatient Diabetes Program Recommendations ? ?AACE/ADA: New Consensus Statement on Inpatient Glycemic Control  ? ?Target Ranges:  Prepandial:   less than 140 mg/dL ?     Peak postprandial:   less than 180 mg/dL (1-2 hours) ?     Critically ill patients:  140 - 180 mg/dL  ? ? Latest Reference Range & Units 09/10/21 07:47  ?Glucose-Capillary 70 - 99 mg/dL 417 (H)  ? ? Latest Reference Range & Units 09/09/21 14:29 09/09/21 15:39 09/09/21 17:37 09/09/21 19:19  ?Glucose-Capillary 70 - 99 mg/dL 143 (H) 146 (H) 169 (H) 159 (H)  ? ?Review of Glycemic Control ? ?Diabetes history: DM ?Outpatient Diabetes medications: Insulin Pump with Novolog ?Current orders for Inpatient glycemic control: Semglee 30 units QHS, Novolog 0-15 units TID with meals, Novolog 0-5 units QHS, Novolog 4 units TID with meals ? ?Inpatient Diabetes Program Recommendations:   ? ?Insulin: Please consider ordering one time Levemir 10 units x1 now and increasing Semglee to 40 units QHS. ? ?NOTE: Patient was transitioned from IV to SQ insulin yesterday; received Semglee 30 units at 19:18 and IV insulin drip stopped at 20:35 on 09/09/21. CBG 176 mg/dl at 21:48 on 09/09/21 and no Novolog correction given since IV insulin was stopped. Glucose this morning is 417 mg/dl. ? ?Addendum 09/10/21@11 :05-Spoke with patient and her husband at bedside. Patient lying in bed with insulin pump in hand. Patient states she is feeling confused like she can't think and she is having difficulty getting her pump restarted. Patient's husband reports that patient does everything with her DM and insulin pump herself and she usually "knows this inside and out". Patient's husband is concerned that patient can't remember what to do with her insulin pump. Reviewed insulin pump settings which are: ?12A Basal: 2.3 units/hr; I:CR 1:20 grams; I:SF 1:45 mg/dl, Target glucose 110 ?6A   Basal: 2.3 units/hr; I:CR 1:20 grams; I:SF 1:45 mg/dl, Target glucose 110 ?12P Basal: 2.7 units/hr; I:CR 1:20 grams;  I:SF 1:45 mg/dl, Target glucose 110 ?7P   Basal: 2.95 units/hr; I:CR 1:20 grams; I:SF 1:45 mg/dl, Target glucose 110 ?Patient confirms that Dr. [name] is her Endocrinologist and that she has not seen him since May 2022. Patient reports she has an appointment with Dr. [name] this month for follow up. Informed patient that given her confusion with getting insulin pump restarted, it would be recommended that she be continued on SQ insulin for now. Patient states she can not remember much of anything from yesterday and not really sure what she last remembers from the past few days. Patient verbalized understanding of the recommendation to continue SQ insulin at this time. Discussed with Dr. Carles Collet and Donella Stade, RN. ? ?Thanks, ?Jasmine Alderman, RN, MSN, CDE ?Diabetes Coordinator ?Inpatient Diabetes Program ?[phone] (Team Pager from 8am to 5pm) ? ? ?

## 2021-09-11 ENCOUNTER — Inpatient Hospital Stay (HOSPITAL_COMMUNITY): Payer: BC Managed Care – PPO

## 2021-09-11 DIAGNOSIS — R778 Other specified abnormalities of plasma proteins: Secondary | ICD-10-CM

## 2021-09-11 DIAGNOSIS — G9341 Metabolic encephalopathy: Secondary | ICD-10-CM

## 2021-09-11 DIAGNOSIS — N185 Chronic kidney disease, stage 5: Secondary | ICD-10-CM

## 2021-09-11 DIAGNOSIS — E0810 Diabetes mellitus due to underlying condition with ketoacidosis without coma: Secondary | ICD-10-CM | POA: Diagnosis not present

## 2021-09-11 DIAGNOSIS — N179 Acute kidney failure, unspecified: Secondary | ICD-10-CM | POA: Diagnosis not present

## 2021-09-11 DIAGNOSIS — I160 Hypertensive urgency: Secondary | ICD-10-CM | POA: Diagnosis not present

## 2021-09-11 DIAGNOSIS — E782 Mixed hyperlipidemia: Secondary | ICD-10-CM

## 2021-09-11 LAB — ECHOCARDIOGRAM COMPLETE
AR max vel: 2.38 cm2
AV Area VTI: 2.52 cm2
AV Area mean vel: 2.53 cm2
AV Mean grad: 8.7 mmHg
AV Peak grad: 17.6 mmHg
Ao pk vel: 2.1 m/s
Area-P 1/2: 2.22 cm2
Calc EF: 55.1 %
Height: 69 in
MV VTI: 2.47 cm2
S' Lateral: 2.5 cm
Single Plane A2C EF: 58.9 %
Single Plane A4C EF: 53.3 %
Weight: 5209.91 oz

## 2021-09-11 LAB — COMPREHENSIVE METABOLIC PANEL
ALT: 16 U/L (ref 0–44)
AST: 14 U/L — ABNORMAL LOW (ref 15–41)
Albumin: 2.6 g/dL — ABNORMAL LOW (ref 3.5–5.0)
Alkaline Phosphatase: 81 U/L (ref 38–126)
Anion gap: 10 (ref 5–15)
BUN: 46 mg/dL — ABNORMAL HIGH (ref 6–20)
CO2: 18 mmol/L — ABNORMAL LOW (ref 22–32)
Calcium: 9.7 mg/dL (ref 8.9–10.3)
Chloride: 104 mmol/L (ref 98–111)
Creatinine, Ser: 3.92 mg/dL — ABNORMAL HIGH (ref 0.44–1.00)
GFR, Estimated: 13 mL/min — ABNORMAL LOW (ref >60)
Glucose, Bld: 400 mg/dL — ABNORMAL HIGH (ref 70–99)
Potassium: 4.9 mmol/L (ref 3.5–5.1)
Sodium: 132 mmol/L — ABNORMAL LOW (ref 135–145)
Total Bilirubin: 0.7 mg/dL (ref 0.3–1.2)
Total Protein: 5.5 g/dL — ABNORMAL LOW (ref 6.5–8.1)

## 2021-09-11 LAB — GLUCOSE, CAPILLARY
Glucose-Capillary: 127 mg/dL — ABNORMAL HIGH (ref 70–99)
Glucose-Capillary: 197 mg/dL — ABNORMAL HIGH (ref 70–99)
Glucose-Capillary: 352 mg/dL — ABNORMAL HIGH (ref 70–99)
Glucose-Capillary: 384 mg/dL — ABNORMAL HIGH (ref 70–99)
Glucose-Capillary: 87 mg/dL (ref 70–99)

## 2021-09-11 LAB — HEPARIN LEVEL (UNFRACTIONATED)
Heparin Unfractionated: 0.34 IU/mL (ref 0.30–0.70)
Heparin Unfractionated: 0.38 IU/mL (ref 0.30–0.70)

## 2021-09-11 LAB — CALCITRIOL (1,25 DI-OH VIT D): Vit D, 1,25-Dihydroxy: 54.8 pg/mL (ref 24.8–81.5)

## 2021-09-11 LAB — PARATHYROID HORMONE, INTACT (NO CA): PTH: 10 pg/mL — ABNORMAL LOW (ref 15–65)

## 2021-09-11 MED ORDER — ORAL CARE MOUTH RINSE
15.0000 mL | Freq: Two times a day (BID) | OROMUCOSAL | Status: DC
  Administered 2021-09-11 – 2021-09-12 (×2): 15 mL via OROMUCOSAL

## 2021-09-11 MED ORDER — HEPARIN SODIUM (PORCINE) 5000 UNIT/ML IJ SOLN
5000.0000 [IU] | Freq: Three times a day (TID) | INTRAMUSCULAR | Status: DC
  Administered 2021-09-11 – 2021-09-12 (×4): 5000 [IU] via SUBCUTANEOUS
  Filled 2021-09-11 (×4): qty 1

## 2021-09-11 MED ORDER — CHLORHEXIDINE GLUCONATE 0.12 % MT SOLN
15.0000 mL | Freq: Two times a day (BID) | OROMUCOSAL | Status: DC
  Administered 2021-09-11 – 2021-09-12 (×3): 15 mL via OROMUCOSAL
  Filled 2021-09-11 (×3): qty 15

## 2021-09-11 NOTE — Progress Notes (Signed)
ANTICOAGULATION CONSULT NOTE ?Pharmacy Consult for heparin ?Indication: chest pain/ACS ?Brief A/P: Heparin level within goal range Continue Heparin at current rate  ? ?Allergies  ?Allergen Reactions  ? Ciprofloxacin Swelling and Other (See Comments)  ?  Per pt caused lips swell and nauseous feeling  ? Levaquin [Levofloxacin] Swelling and Other (See Comments)  ?  Per pt caused lips swell and nauseous feeling  ? Buspar [Buspirone] Other (See Comments)  ?  abd cramping  ? Linaclotide Other (See Comments)  ? Advair Diskus [Fluticasone-Salmeterol] Other (See Comments)  ?  Thrush   ? Biaxin [Clarithromycin] Rash  ? Hydroxyzine Palpitations  ? ? ?Patient Measurements: ?Height: 5\' 9"  (175.3 cm) ?Weight: (!) 147.7 kg (325 lb 9.9 oz) ?IBW/kg (Calculated) : 66.2 ?Heparin Dosing Weight: 101kg ? ?Vital Signs: ?Temp: 97.8 ?F (36.6 ?C) (04/07 0735) ?Temp Source: Axillary (04/07 0735) ?BP: 163/61 (04/07 1019) ?Pulse Rate: 71 (04/07 0900) ? ?Labs: ?Recent Labs  ?  09/08/21 ?1557 09/09/21 ?2707 09/09/21 ?8675 09/09/21 ?1517 09/10/21 ?1701 09/10/21 ?1857 09/11/21 ?4492 09/11/21 ?0100  ?HGB 13.8 12.7  --   --  11.5*  --   --   --   ?HCT 43.5 41.4  --   --  36.0  --   --   --   ?PLT 297 300  --   --  278  --   --   --   ?HEPARINUNFRC  --   --   --   --   --   --  0.34 0.38  ?CREATININE 3.33* 3.57*   < > 3.62* 3.96*  --  3.92*  --   ?TROPONINIHS  --   --   --   --  1,366* 1,217*  --   --   ? < > = values in this interval not displayed.  ? ? ? ?Estimated Creatinine Clearance: 27.1 mL/min (A) (by C-G formula based on SCr of 3.92 mg/dL (H)). ? ? ?Assessment: ?51 y.o. female with elevated troponin, possible ACS, for heparin ? ?Two therapeutic HL, most recent 0.38  ? ?Goal of Therapy:  ?Heparin level 0.3-0.7 units/ml ?Monitor platelets by anticoagulation protocol: Yes ?  ?Plan:  ?Continue Heparin IV at 1300units/hr ?Recheck HL with AM labs  ? ?Donna Christen Latyra Jaye, PharmD, MBA ?Clinical Pharmacist ? ? ?

## 2021-09-11 NOTE — Evaluation (Signed)
Physical Therapy Evaluation ?Patient Details ?Name: Jasmine [name] ?MRN: [id number] ?DOB: 02/07/1971 ?Today's Date: 09/11/2021 ? ?History of Present Illness ? Jasmine [name] is a 51 y.o. female with medical history significant of with history of diabetes mellitus, hypertension, fibromyalgia, GERD, hypothyroidism, stress incontinence, former smoker, and more presents ED with a chief complaint of dizziness.  This started the day prior to presentation.  He feels like the room is spinning around her.  No history of vertigo.  The headache started at the same time.  Straight across the front of her forehead.  She is not sure when her last BP meds dose was, but she thinks it was 2 nights ago.  Patient reports that she cannot describe her headache.  She has had no blurry vision, no change in hearing, no asymmetric weakness.  No ringing in her ears.  Patient denies chest pain and palpitations. ?  ?Clinical Impression ? Patient presents up in chair (assisted by nursing staff) and agreeable for therapy.  Patient has difficulty completing sit to stands mostly due to left knee pain when weightbearing, able to take a steps at bedside without loss of balance, but limited mostly due to c/o dizziness and put back to bed after therapy.  Patient will benefit from continued skilled physical therapy in hospital and recommended venue below to increase strength, balance, endurance for safe ADLs and gait.    ?   ? ?Recommendations for follow up therapy are one component of a multi-disciplinary discharge planning process, led by the attending physician.  Recommendations may be updated based on patient status, additional functional criteria and insurance authorization. ? ?Follow Up Recommendations Home health PT ? ?  ?Assistance Recommended at Discharge Set up Supervision/Assistance  ?Patient can return home with the following ? A little help with walking and/or transfers;A little help with bathing/dressing/bathroom;Help with stairs or  ramp for entrance;Assistance with cooking/housework ? ?  ?Equipment Recommendations None recommended by PT  ?Recommendations for Other Services ?    ?  ?Functional Status Assessment Patient has had a recent decline in their functional status and demonstrates the ability to make significant improvements in function in a reasonable and predictable amount of time.  ? ?  ?Precautions / Restrictions Precautions ?Precautions: Fall ?Restrictions ?Weight Bearing Restrictions: No  ? ?  ? ?Mobility ? Bed Mobility ?Overal bed mobility: Needs Assistance ?Bed Mobility: Sit to Supine ?  ?  ?  ?Sit to supine: Min guard, Min assist ?  ?General bed mobility comments: Patient presents seated in chair (assisted by nursing staff), requires assistance to move legs when getting back in bed ?  ? ?Transfers ?Overall transfer level: Needs assistance ?Equipment used: Rolling walker (2 wheels) ?Transfers: Sit to/from Stand, Bed to chair/wheelchair/BSC ?Sit to Stand: Min assist ?  ?Step pivot transfers: Supervision, Min guard ?  ?  ?  ?General transfer comment: has diffiuclty completing sit to stands due to left knee pain ?  ? ?Ambulation/Gait ?Ambulation/Gait assistance: Min assist ?Gait Distance (Feet): 8 Feet ?Assistive device: Rolling walker (2 wheels) ?Gait Pattern/deviations: Decreased step length - right, Decreased step length - left, Decreased stride length ?Gait velocity: decreased ?  ?  ?General Gait Details: limited to few slow labored steps using RW mostly due to c/o dizziness ? ?Stairs ?  ?  ?  ?  ?  ? ?Wheelchair Mobility ?  ? ?Modified Rankin (Stroke Patients Only) ?  ? ?  ? ?Balance Overall balance assessment: Needs assistance ?Sitting-balance support: Feet supported, No upper  extremity supported ?Sitting balance-Leahy Scale: Good ?Sitting balance - Comments: seated at EOB ?  ?Standing balance support: During functional activity, Bilateral upper extremity supported ?Standing balance-Leahy Scale: Fair ?Standing balance comment:  fair/good using RW ?  ?  ?  ?  ?  ?  ?  ?  ?  ?  ?  ?   ? ? ? ?Pertinent Vitals/Pain Pain Assessment ?Pain Assessment: Faces ?Faces Pain Scale: Hurts little more ?Pain Location: left knee when weightbearing ?Pain Descriptors / Indicators: Sore, Grimacing, Guarding ?Pain Intervention(s): Limited activity within patient's tolerance, Monitored during session, Repositioned  ? ? ?Home Living Family/patient expects to be discharged to:: Private residence ?Living Arrangements: Spouse/significant other ?Available Help at Discharge: Family;Available PRN/intermittently ?Type of Home: House ?Home Access: Stairs to enter ?Entrance Stairs-Rails: None ?Entrance Stairs-Number of Steps: 1 ?  ?Home Layout: One level ?Home Equipment: Conservation officer, nature (2 wheels);Cane - single point;Electric scooter;Wheelchair - manual;Shower seat;BSC/3in1 ?   ?  ?Prior Function Prior Level of Function : Independent/Modified Independent ?  ?  ?  ?  ?  ?  ?Mobility Comments: household ambulator without AD most of time, occasionally uses SPC or RW, electric scooter for community distances ?ADLs Comments: assisted by family ?  ? ? ?Hand Dominance  ?   ? ?  ?Extremity/Trunk Assessment  ? Upper Extremity Assessment ?Upper Extremity Assessment: Overall WFL for tasks assessed ?  ? ?Lower Extremity Assessment ?Lower Extremity Assessment: Generalized weakness ?  ? ?Cervical / Trunk Assessment ?Cervical / Trunk Assessment: Normal  ?Communication  ? Communication: No difficulties  ?Cognition Arousal/Alertness: Awake/alert ?Behavior During Therapy: [facility] for tasks assessed/performed ?Overall Cognitive Status: Within Functional Limits for tasks assessed ?  ?  ?  ?  ?  ?  ?  ?  ?  ?  ?  ?  ?  ?  ?  ?  ?  ?  ?  ? ?  ?General Comments   ? ?  ?Exercises    ? ?Assessment/Plan  ?  ?PT Assessment Patient needs continued PT services  ?PT Problem List Decreased strength;Decreased activity tolerance;Decreased balance;Decreased mobility ? ?   ?  ?PT Treatment Interventions DME  instruction;Gait training;Stair training;Functional mobility training;Therapeutic activities;Therapeutic exercise;Patient/family education;Balance training   ? ?PT Goals (Current goals can be found in the Care Plan section)  ?Acute Rehab PT Goals ?Patient Stated Goal: return home with family to assist ?PT Goal Formulation: With patient ?Time For Goal Achievement: 09/25/21 ?Potential to Achieve Goals: Good ? ?  ?Frequency Min 3X/week ?  ? ? ?Co-evaluation   ?  ?  ?  ?  ? ? ?  ?AM-PAC PT "6 Clicks" Mobility  ?Outcome Measure Help needed turning from your back to your side while in a flat bed without using bedrails?: None ?Help needed moving from lying on your back to sitting on the side of a flat bed without using bedrails?: A Little ?Help needed moving to and from a bed to a chair (including a wheelchair)?: A Little ?Help needed standing up from a chair using your arms (e.g., wheelchair or bedside chair)?: A Little ?Help needed to walk in hospital room?: A Little ?Help needed climbing 3-5 steps with a railing? : A Lot ?6 Click Score: 18 ? ?  ?End of Session   ?Activity Tolerance: Patient tolerated treatment well;Patient limited by fatigue ?Patient left: in bed;with call bell/phone within reach ?Nurse Communication: Mobility status ?PT Visit Diagnosis: Unsteadiness on feet (R26.81);Other abnormalities of gait and mobility (R26.89);Muscle weakness (generalized) (  M62.81) ?  ? ?Time: 6568-1275 ?PT Time Calculation (min) (ACUTE ONLY): 25 min ? ? ?Charges:   PT Evaluation ?$PT Eval Moderate Complexity: 1 Mod ?PT Treatments ?$Therapeutic Activity: 23-37 mins ?  ?   ? ? ?2:49 PM, 09/11/21 ?Lonell Grandchild, MPT ?Physical Therapist with [facility] ?[facility] ?[phone] office ?[phone] mobile phone ? ? ?

## 2021-09-11 NOTE — Progress Notes (Signed)
Inpatient Diabetes Program Recommendations ? ?AACE/ADA: New Consensus Statement on Inpatient Glycemic Control  ? ?Target Ranges:  Prepandial:   less than 140 mg/dL ?     Peak postprandial:   less than 180 mg/dL (1-2 hours) ?     Critically ill patients:  140 - 180 mg/dL  ? ? ? ?Review of Glycemic Control ? Latest Reference Range & Units 09/10/21 07:47 09/10/21 11:20 09/10/21 15:58 09/10/21 16:20 09/10/21 17:57 09/10/21 20:48 09/11/21 00:17 09/11/21 07:53  ?Glucose-Capillary 70 - 99 mg/dL 417 (H) ? ?Novolog 24 units ?Given late at 91 34 (H) ? ?Novolog 15 units 33 (LL) 121 (H) 117 (H) 195 (H) ? ? ? ? ? ? ?Semglee 30 units 352 (H) 384 (H) ? ?Novolog 19 units  ? ?Diabetes history: DM ?Outpatient Diabetes medications: Insulin Pump with Novolog ?Current orders for Inpatient glycemic control: Semglee 30 units QHS, Novolog 0-15 units TID with meals, Novolog 0-5 units QHS, Novolog 4 units TID with meals ? ?Inpatient Diabetes Program Recommendations:   ? ?NOTE: Hypoglycemia yesterday due to Novolog sensitivity and large dose for a fasting glucose of 417. ? ?-   Consider ordering one time Levemir 10 units x1 now and increasing Semglee to 40 units QHS. ?-  Reduce Novolog to Sensitive Correction 0-9 units tid + hs ?  ?Thanks, ?Tama Headings RN, MSN, BC-ADM ?Inpatient Diabetes Coordinator ?Team Pager [phone] (8a-5p) ? ? ?

## 2021-09-11 NOTE — Progress Notes (Addendum)
PROGRESS NOTE  Jasmine [name]:[id number] DOB: 1970/08/28 DOA: 09/08/2021 PCP: Richmond Campbell., PA-C  Brief History:  51 year old female with a history of insulin-dependent diabetes mellitus, CKD stage IV, migraine headaches, fibromyalgia, bipolar type II disorder, hyperlipidemia, hypothyroidism presenting with 1 day history of nausea, vomiting, and dizziness.  The patient also had an associated bifrontal headache.  She stated that she was not able to tolerate her antihypertensive medications.  However she stated that she last took her amlodipine on 09/07/2021.  In the emergency department, the patient was noted to be hypertensive and initially given intravenous metoprolol and hydralazine.  Although her BP initially improved, it climbed back up for which the patient was started on intravenous Cardene.  She was initially given 2 L of IV fluids.  routine blood work subsequently showed that the patient had a serum glucose of 796 with anion gap of 16.  She was started on IV fluids and IV insulin. The patient is a difficult historian.  However, she stated that she has not been completely compliant with her antihypertensive medications nor her insulin.  She is not able to elaborate how often she misses her medications.  She denies any chest pain, shortness breath, abdominal pain, diarrhea, hematemesis, hematochezia, melena. Blood work showed sodium 120, potassium 5.9, bicarbonate 18, serum creatinine 3.57.  LFTs were unremarkable.  WBC 15.3, hemoglobin 12.7, platelets 200,000.  She was started on IVF and IV insulin.  BP meds adjustments instituted to wean off cardene drip.    Assessment and Plan: DKA (diabetic ketoacidosis) (HCC) -patient started on IV insulin with q 1 hour CBG check and q 4 hour BMPs -pt started on aggressive fluid resuscitation -Electrolytes were monitored and repleted -transitioned to [place] insulin once anion gap closed -diet was advanced once anion gap  closed 09/09/21-HbA1C--6.7   Acute renal failure superimposed on stage 4 chronic kidney disease (HCC) Baseline creatinine 2.7-3.0 Presented with serum creatinine 3.57 Secondary to volume depletion Continue IV fluids -suspect patient has new baseline now -renal US--no hydronephrosis  Uncontrolled diabetes mellitus with hyperglycemia, with long-term current use of insulin (HCC) 4/5 hemoglobin A1c--6.7 Transition to [facility]  Asked patient to have spouse bring in insulin pump and Dexcom CGM Moderate novolog sliding scale  HTN (hypertension) Restart carvedilol, amlodipine -increase coreg to 25 mg bid -wean cardene drip for SBP <160  Elevated troponin Troponin peaked 1366>>1217 IV heparin started 4/6 evening>>continue Cardiology consult Echo  Acute metabolic encephalopathy Multifactorial including hypercalcemia, hyponatremia, renal failure, meds, hypoglycemia UA--neg for pyurina Ammonia 25 B12--423 Folate--37.4 TSH 10.434 CT brain--no acute finding Hold cymbalta and gabapentin 4/7--more alert  Hypercalcemia Intact PTH--pending Corrected calcium 11.0 at time of admission 25 vitamin D--39.83 1,25 vit D--pending  Mixed hyperlipidemia Continue statin  GERD (gastroesophageal reflux disease) Continue Protonix  Chronic migraine w/o aura w/o status migrainosus, not intractable Holding Maxalt temporarily secondary to elevated blood pressure 4/6--no headache presently  Bipolar II disorder (HCC) Continue Cymbalta, Lamictal, bupropion Continue Ativan as needed Hold cymbalta and lamictal temporarily  Obesity, Class III, BMI 40-49.9 (morbid obesity) (HCC) BMI 48.09 Lifestyle modification  Hypothyroidism TSH 10.434 Increase synthroid to 175 mcg daily  Hyponatremia Secondary to volume depletion and hyperglycemia -Continue IV fluids>>improved         Status is: Inpatient Remains inpatient appropriate because: severity of illness requiring IVF and IV heparin.   Worsening renal function    Family Communication:   spouse updated 4/6 at bedside  Consultants:  cardiology  Code Status:  FULL  DVT Prophylaxis:  IV Heparin    Procedures: As Listed in Progress Note Above  Antibiotics: None       Subjective: Patient denies fevers, chills, headache, chest pain, dyspnea, nausea, vomiting, diarrhea, abdominal pain, dysuria, hematuria, hematochezia, and melena.   Objective: Vitals:   09/11/21 0735 09/11/21 0800 09/11/21 0900 09/11/21 1019  BP: (!) 150/57 133/80 (!) 146/52 (!) 163/61  Pulse: 78 68 71   Resp: 11 12 17    Temp: 97.8 F (36.6 C)     TempSrc: Axillary     SpO2: 100% 100% 96%   Weight:      Height:        Intake/Output Summary (Last 24 hours) at 09/11/2021 1116 Last data filed at 09/10/2021 1800 Gross per 24 hour  Intake --  Output 800 ml  Net -800 ml   Weight change:  Exam:  General:  Pt is alert, follows commands appropriately, not in acute distress HEENT: No icterus, No thrush, No neck mass, [place]/AT Cardiovascular: RRR, S1/S2, no rubs, no gallops Respiratory: CTA bilaterally, no wheezing, no crackles, no rhonchi Abdomen: Soft/+BS, non tender, non distended, no guarding Extremities: trace LE edema, No lymphangitis, No petechiae, No rashes, no synovitis   Data Reviewed: I have personally reviewed following labs and imaging studies Basic Metabolic Panel: Recent Labs  Lab 09/09/21 0340 09/09/21 0613 09/09/21 0916 09/09/21 1517 09/10/21 1701 09/11/21 0156  NA 128* 129* 132* 132* 134* 132*  K 5.9* 4.9 4.4 4.2 4.0 4.9  CL 94* 94* 98 99 107 104  CO2 18* 17* 21* 24 20* 18*  GLUCOSE 796* 668* 390* 169* 109* 400*  BUN 36* 40* 40* 39* 44* 46*  CREATININE 3.57* 3.82* 3.55* 3.62* 3.96* 3.92*  CALCIUM 10.2 10.5* 10.3 10.5* 10.2 9.7  MG 1.8  --   --   --  1.9  --    Liver Function Tests: Recent Labs  Lab 09/08/21 1557 09/09/21 0340 09/10/21 1701 09/11/21 0156  AST 16 13* 16 14*  ALT 13 15 15 16   ALKPHOS 99  99 82 81  BILITOT 0.5 1.3* 0.6 0.7  PROT 7.0 6.5 5.7* 5.5*  ALBUMIN 3.2* 3.0* 2.8* 2.6*   No results for input(s): LIPASE, AMYLASE in the last 168 hours. Recent Labs  Lab 09/10/21 1701  AMMONIA 25   Coagulation Profile: No results for input(s): INR, PROTIME in the last 168 hours. CBC: Recent Labs  Lab 09/08/21 1557 09/09/21 0340 09/10/21 1701  WBC 9.5 15.3* 7.4  NEUTROABS 6.5 13.6* 4.2  HGB 13.8 12.7 11.5*  HCT 43.5 41.4 36.0  MCV 87.7 91.2 89.6  PLT 297 300 278   Cardiac Enzymes: No results for input(s): CKTOTAL, CKMB, CKMBINDEX, TROPONINI in the last 168 hours. BNP: Invalid input(s): POCBNP CBG: Recent Labs  Lab 09/10/21 1620 09/10/21 1757 09/10/21 2048 09/11/21 0017 09/11/21 0753  GLUCAP 121* 117* 195* 352* 384*   HbA1C: Recent Labs    09/09/21 0340 09/09/21 0613  HGBA1C 6.5* 6.7*   Urine analysis:    Component Value Date/Time   COLORURINE YELLOW 09/10/2021 1800   APPEARANCEUR HAZY (A) 09/10/2021 1800   LABSPEC 1.010 09/10/2021 1800   PHURINE 8.0 09/10/2021 1800   GLUCOSEU >=500 (A) 09/10/2021 1800   HGBUR NEGATIVE 09/10/2021 1800   BILIRUBINUR NEGATIVE 09/10/2021 1800   KETONESUR NEGATIVE 09/10/2021 1800   PROTEINUR >=300 (A) 09/10/2021 1800   UROBILINOGEN 0.2 03/19/2015 1525   NITRITE NEGATIVE 09/10/2021 1800   LEUKOCYTESUR NEGATIVE  09/10/2021 1800   Sepsis Labs: @LABRCNTIP (procalcitonin:4,lacticidven:4) ) Recent Results (from the past 240 hour(s))  Resp Panel by RT-PCR (Flu A&B, Covid) Nasopharyngeal Swab     Status: None   Collection Time: 09/09/21 10:38 AM   Specimen: Nasopharyngeal Swab; Nasopharyngeal(NP) swabs in vial transport medium  Result Value Ref Range Status   SARS Coronavirus 2 by RT PCR NEGATIVE NEGATIVE Final    Comment: (NOTE) SARS-CoV-2 target nucleic acids are NOT DETECTED.  The SARS-CoV-2 RNA is generally detectable in upper respiratory specimens during the acute phase of infection. The lowest concentration of  SARS-CoV-2 viral copies this assay can detect is 138 copies/mL. A negative result does not preclude SARS-Cov-2 infection and should not be used as the sole basis for treatment or other patient management decisions. A negative result may occur with  improper specimen collection/handling, submission of specimen other than nasopharyngeal swab, presence of viral mutation(s) within the areas targeted by this assay, and inadequate number of viral copies(<138 copies/mL). A negative result must be combined with clinical observations, patient history, and epidemiological information. The expected result is Negative.  Fact Sheet for Patients:  BloggerCourse.com  Fact Sheet for Healthcare Providers:  SeriousBroker.it  This test is no t yet approved or cleared by the [place] FDA and  has been authorized for detection and/or diagnosis of SARS-CoV-2 by FDA under an Emergency Use Authorization (EUA). This EUA will remain  in effect (meaning this test can be used) for the duration of the COVID-19 declaration under Section 564(b)(1) of the Act, 21 U.S.C.section 360bbb-3(b)(1), unless the authorization is terminated  or revoked sooner.       Influenza A by PCR NEGATIVE NEGATIVE Final   Influenza B by PCR NEGATIVE NEGATIVE Final    Comment: (NOTE) The Xpert Xpress SARS-CoV-2/FLU/RSV plus assay is intended as an aid in the diagnosis of influenza from Nasopharyngeal swab specimens and should not be used as a sole basis for treatment. Nasal washings and aspirates are unacceptable for Xpert Xpress SARS-CoV-2/FLU/RSV testing.  Fact Sheet for Patients: BloggerCourse.com  Fact Sheet for Healthcare Providers: SeriousBroker.it  This test is not yet approved or cleared by the [place] FDA and has been authorized for detection and/or diagnosis of SARS-CoV-2 by FDA under an Emergency Use  Authorization (EUA). This EUA will remain in effect (meaning this test can be used) for the duration of the COVID-19 declaration under Section 564(b)(1) of the Act, 21 U.S.C. section 360bbb-3(b)(1), unless the authorization is terminated or revoked.  Performed at [facility], [address], [place], [place] [address]   MRSA Next Gen by PCR, Nasal     Status: None   Collection Time: 09/09/21 12:37 PM   Specimen: Nasal Mucosa; Nasal Swab  Result Value Ref Range Status   MRSA by PCR Next Gen NOT DETECTED NOT DETECTED Final    Comment: (NOTE) The GeneXpert MRSA Assay (FDA approved for NASAL specimens only), is one component of a comprehensive MRSA colonization surveillance program. It is not intended to diagnose MRSA infection nor to guide or monitor treatment for MRSA infections. Test performance is not FDA approved in patients less than 106 years old. Performed at [facility], [address], [place], [place] [address]      Scheduled Meds:  amLODipine  5 mg Oral Daily   aspirin EC  81 mg Oral QHS   buPROPion  300 mg Oral Daily   carvedilol  25 mg Oral BID WC   Chlorhexidine Gluconate Cloth  6 each Topical Q0600   [  START ON 09/27/2021] DULoxetine  60 mg Oral Daily   insulin aspart  0-15 Units Subcutaneous TID WC   insulin aspart  0-5 Units Subcutaneous QHS   insulin aspart  4 Units Subcutaneous TID WC   insulin glargine-yfgn  30 Units Subcutaneous QHS   [START ON 09/27/2021] lamoTRIgine  200 mg Oral Daily   levothyroxine  150 mcg Oral Q0600   pantoprazole  40 mg Oral Daily   pravastatin  80 mg Oral QHS   Continuous Infusions:  sodium chloride 125 mL/hr at 09/11/21 0518   heparin 1,300 Units/hr (09/10/21 1945)   niCARDipine Stopped (09/10/21 0946)    Procedures/Studies: CT HEAD WO CONTRAST ( )  Result Date: 09/10/2021 CLINICAL DATA:  Dizziness and mental status changes. EXAM: CT HEAD WITHOUT CONTRAST TECHNIQUE: Contiguous axial images were obtained from the base of the  skull through the vertex without intravenous contrast. RADIATION DOSE REDUCTION: This exam was performed according to the departmental dose-optimization program which includes automated exposure control, adjustment of the mA and/or kV according to patient size and/or use of iterative reconstruction technique. COMPARISON:  CT head 03/16/2020, CT head recently 09/08/2021 FINDINGS: Brain: Age-advanced moderate cerebral atrophy and atrophic ventriculomegaly with moderate hypoattenuating white matter disease. Pattern and distribution of the white matter disease suggests chronic microvascular white matter disease but given age could also have a component in demyelinating disease. Cerebellum and brainstem are normal in attenuation and volume. Study is somewhat motion limited but no obvious cortical based acute infarct, hemorrhage, mass effect or midline shift are suspected. Basal cisterns are clear. Vascular: Age advanced calcification of the carotid siphons, distal left vertebral artery. No hyperdense central vasculature. Skull: The calvarium, skull base and orbits are intact. No skull lesion is seen. Sinuses/Orbits: No acute finding. Other: None. IMPRESSION: 1. Stable moderate age-advanced cerebral atrophy and hypoattenuating white matter disease, the latter most likely reflecting small-vessel disease, demyelinating disease also possible given age. Please correlate clinically. 2. Motion limited exam with no obvious acute intracranial CT abnormality. Follow-up with MRI if there is concern for occult infarct. 3. Age advanced carotid atherosclerosis. Electronically Signed   By: Almira Bar M.D.   On: 09/10/2021 20:49   CT Head Wo Contrast  Result Date: 09/08/2021 CLINICAL DATA:  Dizziness and nausea since yesterday. EXAM: CT HEAD WITHOUT CONTRAST TECHNIQUE: Contiguous axial images were obtained from the base of the skull through the vertex without intravenous contrast. RADIATION DOSE REDUCTION: This exam was performed  according to the departmental dose-optimization program which includes automated exposure control, adjustment of the mA and/or kV according to patient size and/or use of iterative reconstruction technique. COMPARISON:  MRI Oct 12, 2018 and head CT March 16, 2020. FINDINGS: Brain: Global parenchymal volume loss with ex vacuo dilatation of the ventricular system similar prior. Similar burden of chronic microvascular ischemic white matter disease. No acute intracranial hemorrhage. No acute large vessel territory infarction. No mass lesion or midline shift. No extra-axial fluid collection. No hydrocephalus. Vascular: No hyperdense vessel. Atherosclerotic calcifications of the internal carotid arteries at the skull base. Skull: Normal. Negative for fracture or focal lesion. Sinuses/Orbits: Visualized portions of the paranasal sinuses and ethmoid air cells are predominantly clear. Orbits are grossly unremarkable. Other: Mastoid air cells are predominantly clear. IMPRESSION: 1. No acute intracranial abnormality on this slightly motion degraded examination. 2. Similar burden of chronic microvascular ischemic white matter disease. Electronically Signed   By: Maudry Mayhew M.D.   On: 09/08/2021 17:25   US RENAL  Result Date: 09/10/2021 CLINICAL  DATA:  Acute kidney injury, history diabetes mellitus, hypertension EXAM: RENAL / URINARY TRACT ULTRASOUND COMPLETE COMPARISON:  CT abdomen and pelvis 04/07/2011 FINDINGS: Right Kidney: Renal measurements: 11.6 x 5.4 x 4.7 cm = volume: 153 mL. Normal cortical thickness and echogenicity. Mildly lobulated contour. No mass, hydronephrosis, or shadowing calcification. Left Kidney: Renal measurements: 11.8 x 6.0 x 5.0 cm = volume: 185 mL. Normal cortical thickness and echogenicity. Mildly lobulated contour. No mass, hydronephrosis, or shadowing calcification. Bladder: Distended measuring 15.2 x 9.2 x 12.6 cm (volume = 920 cm^3). Layering debris. No gross mass or wall thickening. Other:  N/A IMPRESSION: Distended urinary bladder. Otherwise negative exam. Electronically Signed   By: Ulyses Southward M.D.   On: 09/10/2021 11:50    Catarina Hartshorn, DO  [facility]  If 7PM-7AM, please contact night-coverage [email or web address] Password [email or web address] 09/11/2021, 11:16 AM   LOS: 2 days

## 2021-09-11 NOTE — Assessment & Plan Note (Addendum)
Troponin peaked 1366>>1217 ?IV heparin started 4/6 evening>>d/ced after 24 hours and remained stable ?Cardiology consult appreciated--felt this was demand ischemia ?Echo EF > 75%, no WMA ?

## 2021-09-11 NOTE — Progress Notes (Signed)
ANTICOAGULATION CONSULT NOTE ?Pharmacy Consult for heparin ?Indication: chest pain/ACS ?Brief A/P: Heparin level within goal range Continue Heparin at current rate  ? ?Allergies  ?Allergen Reactions  ? Ciprofloxacin Swelling and Other (See Comments)  ?  Per pt caused lips swell and nauseous feeling  ? Levaquin [Levofloxacin] Swelling and Other (See Comments)  ?  Per pt caused lips swell and nauseous feeling  ? Buspar [Buspirone] Other (See Comments)  ?  abd cramping  ? Linaclotide Other (See Comments)  ? Advair Diskus [Fluticasone-Salmeterol] Other (See Comments)  ?  Thrush   ? Biaxin [Clarithromycin] Rash  ? Hydroxyzine Palpitations  ? ? ?Patient Measurements: ?Height: 5\' 9"  (175.3 cm) ?Weight: (!) 147.7 kg (325 lb 9.9 oz) ?IBW/kg (Calculated) : 66.2 ?Heparin Dosing Weight: 101kg ? ?Vital Signs: ?Temp: 97.7 ?F (36.5 ?C) (04/07 0021) ?Temp Source: Axillary (04/07 0021) ?BP: 164/73 (04/07 0200) ?Pulse Rate: 79 (04/07 0200) ? ?Labs: ?Recent Labs  ?  09/08/21 ?1557 09/09/21 ?4715 09/09/21 ?9539 09/09/21 ?1517 09/10/21 ?1701 09/10/21 ?1857 09/11/21 ?0156  ?HGB 13.8 12.7  --   --  11.5*  --   --   ?HCT 43.5 41.4  --   --  36.0  --   --   ?PLT 297 300  --   --  278  --   --   ?HEPARINUNFRC  --   --   --   --   --   --  0.34  ?CREATININE 3.33* 3.57*   < > 3.62* 3.96*  --  3.92*  ?TROPONINIHS  --   --   --   --  1,366* 1,217*  --   ? < > = values in this interval not displayed.  ? ? ? ?Estimated Creatinine Clearance: 27.1 mL/min (A) (by C-G formula based on SCr of 3.92 mg/dL (H)). ? ? ?Assessment: ?51 y.o. female with elevated troponin, possible ACS, for heparin ? ?Goal of Therapy:  ?Heparin level 0.3-0.7 units/ml ?Monitor platelets by anticoagulation protocol: Yes ?  ?Plan:  ?Continue Heparin at current rate  ? ?Phillis Knack, PharmD, BCPS ? ? ? ?

## 2021-09-11 NOTE — Progress Notes (Signed)
PT recommends HHPT. Patient is agreeable to HHPT. Tommi Rumps with [facility] is accepting of patient's insurance and accepts referral.  ? ? ?Violetta Lavalle D, LCSW  ?

## 2021-09-11 NOTE — Assessment & Plan Note (Addendum)
Multifactorial including hypercalcemia, hyponatremia, renal failure, meds, hypoglycemia ?UA--neg for pyurina ?Ammonia 25 ?B12--423 ?Folate--37.4 ?TSH 10.434 ?CT brain--no acute finding ?Hold cymbalta and gabapentin ?4/7--more alert ?4/8--back to baseline ?

## 2021-09-11 NOTE — Consult Note (Addendum)
?Cardiology Consultation:  ? ?Patient ID: Jasmine [name] ?MRN: [id number]; DOB: 12/19/1970 ? ?Admit date: 09/08/2021 ?Date of Consult: 09/11/2021 ? ?PCP:  Aletha Halim., PA-C ?  ?[facility] Providers ?Cardiologist:  Buford Dresser, MD      ? ? ?Patient Profile:  ? ?Jasmine [name] is a 51 y.o. female with a hx of Type 1 DM, HTN, HLD, RA, severe OSA, chronic hypoxic respiratory failure and Stage 5 CKD who is being seen 09/11/2021 for the evaluation of elevated troponin values at the request of Dr. Carles Collet. ? ?History of Present Illness:  ? ?Ms. Jasmine [name] was last examined by Dr. Harrell Gave in 04/2021 and reported having chronic dyspnea but no acute change in symptoms. She previously had bradycardia with higher doses of Coreg, therefore she was continued on Coreg 12.5 mg twice daily but was started on Amlodipine 5 mg daily given her elevated BP. ? ?She presented to [facility] ED on 09/08/2021 for evaluation of worsening dizziness and nausea which started the evening prior to arrival. She was found to have hypertensive urgency with BP at 197/85 while in the ED. She was started on a Cardene drip. Was also found to have an AKI with creatinine at 3.33 on admission (listed as AKI but creatinine was at 3.39 in 02/2021 by Care Everywhere and 2.74 in 05/2021) and to be in DKA and started on aggressive fluid resuscitation and IV Insulin. ? ?On 09/10/2021, she had AMS and CBG was initially at 39 and improved to 121 with D50. Further work-up was obtained and EKG showed NSR, HR 65 with slight TWI along V1 and V2. CT Head showed stable moderate age-advanced cerebral atrophy and white matter disease and possible demyelinating disease with follow-up MRI recommended. Hs Troponin values were obtained and found to be elevated to 1366 and 1217. She denied any chest pain and her case was discussed with Dr. Johney Frame who recommended starting IV Heparin and obtaining an echocardiogram.   ? ?Echo is pending for this AM. Labs this AM  show Na+ is slightly low at 132. Glucose remains elevated at 400 and creatinine is at 3.92. ? ?In talking with the patient today, she denies any recent chest pain yesterday or today. No recent pain prior to admission as well. She does have baseline dyspnea on exertion and uses supplemental oxygen as needed and reports good compliance with her CPAP at night. She does have baseline orthopnea but denies any specific PND. She does experience intermittent lower extremity edema and takes Lasix 20 mg daily at home. ? ?She is unaware of any personal history of CAD, CHF or cardiac arrhythmias.  Reports her dad had a heart attack in his 22's. ? ? ? ?Past Medical History:  ?Diagnosis Date  ? Anxiety   ? Arthritis   ? Asthma   ? Balance problems   ? Bipolar disorder ([place])   ? Charcot ankle   ? Chronic fatigue   ? 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 Depression   ? Diabetes mellitus   ? DKA, type 1 ([place]) 11/04/2011  ? Elevated cholesterol   ? Fibromyalgia   ? GERD (gastroesophageal reflux disease)   ? Headache   ? History of suicidal ideation   ? Hyperlipemia   ? Hypertension   ? Hypothyroidism   ? IBS (irritable bowel syndrome)   ? Memory changes   ? Obesity   ? Sleep apnea   ? HAS C -PAP / DOES NOT USE  ? Stress  incontinence   ? Pt had surgery to correct this.  ? Tachycardia   ? Tobacco abuse   ? Tremor   ? UTI (lower urinary tract infection)   ? ? ?Past Surgical History:  ?Procedure Laterality Date  ? INCONTINENCE SURGERY    ? NASAL FRACTURE SURGERY    ? ovary removed    ? OVARY SURGERY    ? PUBOVAGINAL SLING  08/16/2011  ? Procedure: [name];  Surgeon: Bernestine Amass, MD;  Location: [facility];  Service: Urology;  Laterality: N/A;   ? ? ? ? ?  ? [place]  2001  ?  ? ?Home Medications:  ?Prior to Admission medications   ?Medication Sig Start Date End Date Taking? Authorizing Provider  ?amLODipine (NORVASC) 5 MG tablet Take 1 tablet (5 mg total) by mouth daily. 04/15/21 04/10/22 Yes Buford Dresser,  MD  ?APPLE CIDER VINEGAR PO Take 1 tablet by mouth daily.   Yes [provider]  ?aspirin EC 81 MG tablet Take 81 mg by mouth at bedtime.    Yes [provider]  ?buPROPion (WELLBUTRIN XL) 300 MG 24 hr tablet Take 1 tablet (300 mg total) by mouth daily. 06/29/21 12/26/21 Yes Norman Clay, MD  ?carvedilol (COREG) 25 MG tablet Take 12.5 mg by mouth 2 (two) times daily with a meal.   Yes [provider]  ?Cholecalciferol 50 MCG (2000 UT) TABS Take 1 tablet by mouth daily.  07/26/18  Yes [provider]  ?Coenzyme Q10 (COQ-10 PO) Take 1 capsule by mouth daily.   Yes [provider]  ?dicyclomine (BENTYL) 10 MG capsule Take 10 mg by mouth 3 (three) times daily as needed for spasms.   Yes [provider]  ?DULoxetine (CYMBALTA) 60 MG capsule Take 1 capsule (60 mg total) by mouth daily. 09/27/21 01/25/22 Yes Norman Clay, MD  ?fluticasone (FLONASE) 50 MCG/ACT nasal spray Place 2 sprays into the nose daily as needed for allergies.    Yes [provider]  ?folic acid (FOLVITE) 858 MCG tablet Take 400 mcg by mouth every evening.    Yes [provider]  ?furosemide (LASIX) 20 MG tablet Take 20 mg by mouth daily.    Yes [provider]  ?gabapentin (NEURONTIN) 300 MG capsule Take 300 mg by mouth 2 (two) times daily. One capsule in am and 2 capsules in evening 02/25/20  Yes [provider]  ?insulin glargine (LANTUS) 100 UNIT/ML injection Inject 0.5 mLs (50 Units total) into the skin daily. ?Patient taking differently: Inject 50 Units into the skin daily as needed (when pump is not working). 09/04/18  Yes Derrill Center, NP  ?lamoTRIgine (LAMICTAL) 200 MG tablet Take 1 tablet (200 mg total) by mouth daily. 09/27/21 01/25/22 Yes Hisada, Elie Goody, MD  ?levothyroxine (SYNTHROID, LEVOTHROID) 150 MCG tablet Take 150 mcg by mouth daily before breakfast.   Yes [provider]  ?LORazepam (ATIVAN) 0.5 MG tablet Take 1 tablet (0.5 mg total) by mouth  daily as needed for anxiety. 07/02/21 09/30/21 Yes Hisada, Elie Goody, MD  ?NOVOLOG 100 UNIT/ML injection Inject 2.425-2.95 Units into the skin continuous. Via pump ?7pm to midnight 2.950 ?8:30am -7pm2.925 ?Midnight - 8:30 2.425 02/24/17  Yes [provider]  ?Omega-3 Fatty Acids (FISH OIL PO) Take 1 capsule by mouth daily.   Yes [provider]  ?omeprazole (PRILOSEC) 20 MG capsule Take 20 mg by mouth at bedtime.    Yes [provider]  ?ondansetron (ZOFRAN ODT) 4 MG disintegrating tablet  Take 1 tablet (4 mg total) by mouth every 8 (eight) hours as needed. 03/23/21  Yes Marcial Pacas, MD  ?oxyCODONE (OXY IR/ROXICODONE) 5 MG immediate release tablet Take 5 mg by mouth 4 (four) times daily as needed. 11/27/20  Yes [provider]  ?OXYGEN Inhale 3 L into the lungs as needed.   Yes [provider]  ?pravastatin (PRAVACHOL) 80 MG tablet Take 80 mg by mouth at bedtime.   Yes [provider]  ?PROAIR HFA 108 (90 Base) MCG/ACT inhaler Inhale 1-2 puffs into the lungs every 6 (six) hours as needed for wheezing or shortness of breath.  12/30/16  Yes [provider]  ?Continuous Blood Gluc Sensor MISC 1 each by Does not apply route as directed. Use as directed every 14 days. May dispense FreeStyle [employer] or similar.    [provider]  ?desipramine (NORPRAMIN) 25 MG tablet Take 1 tablet (25 mg total) by mouth daily. 07/30/21 08/29/21  Norman Clay, MD  ?glucagon (GLUCAGEN HYPOKIT) 1 MG SOLR injection GlucaGen HypoKit 1 mg Injection 03/13/20   [provider]  ?rizatriptan (MAXALT) 10 MG tablet Take 1 tablet (10 mg total) by mouth as needed for migraine. May repeat in 2 hours if needed 03/23/21   Marcial Pacas, MD  ? ? ?Inpatient Medications: ?Scheduled Meds: ? amLODipine  5 mg Oral Daily  ? aspirin EC  81 mg Oral QHS  ? buPROPion  300 mg Oral Daily  ? carvedilol  25 mg Oral BID WC  ? Chlorhexidine Gluconate Cloth  6 each Topical Q0600  ? [START ON  09/27/2021] DULoxetine  60 mg Oral Daily  ? insulin aspart  0-15 Units Subcutaneous TID WC  ? insulin aspart  0-5 Units Subcutaneous QHS  ? insulin aspart  4 Units Subcutaneous TID WC  ? insulin glargine-y

## 2021-09-11 NOTE — Progress Notes (Signed)
*  PRELIMINARY RESULTS* ?Echocardiogram ?2D Echocardiogram has been performed. ? ?Jasmine [name] ?09/11/2021, 9:39 AM ?

## 2021-09-11 NOTE — Plan of Care (Signed)
?  Problem: Acute Rehab PT Goals(only PT should resolve) ?Goal: Pt Will Go Supine/Side To Sit ?Outcome: Progressing ?Flowsheets (Taken 09/11/2021 1449) ?Pt will go Supine/Side to Sit: ? with modified independence ? with supervision ?Goal: Patient Will Transfer Sit To/From Stand ?Outcome: Progressing ?Flowsheets (Taken 09/11/2021 1449) ?Patient will transfer sit to/from stand: ? with modified independence ? with supervision ?Goal: Pt Will Transfer Bed To Chair/Chair To Bed ?Outcome: Progressing ?Flowsheets (Taken 09/11/2021 1449) ?Pt will Transfer Bed to Chair/Chair to Bed: ? with modified independence ? with supervision ?Goal: Pt Will Ambulate ?Outcome: Progressing ?Flowsheets (Taken 09/11/2021 1449) ?Pt will Ambulate: ? 75 feet ? with modified independence ? with supervision ? with rolling walker ?  ?2:50 PM, 09/11/21 ?Lonell Grandchild, MPT ?Physical Therapist with [place] ?[facility] ?[phone] office ?[phone] mobile phone ? ?

## 2021-09-12 DIAGNOSIS — E0810 Diabetes mellitus due to underlying condition with ketoacidosis without coma: Secondary | ICD-10-CM | POA: Diagnosis not present

## 2021-09-12 DIAGNOSIS — R519 Headache, unspecified: Secondary | ICD-10-CM

## 2021-09-12 DIAGNOSIS — R778 Other specified abnormalities of plasma proteins: Secondary | ICD-10-CM

## 2021-09-12 DIAGNOSIS — G9341 Metabolic encephalopathy: Secondary | ICD-10-CM | POA: Diagnosis not present

## 2021-09-12 DIAGNOSIS — F3181 Bipolar II disorder: Secondary | ICD-10-CM | POA: Diagnosis not present

## 2021-09-12 DIAGNOSIS — N179 Acute kidney failure, unspecified: Secondary | ICD-10-CM | POA: Diagnosis not present

## 2021-09-12 LAB — CBC
HCT: 35 % — ABNORMAL LOW (ref 36.0–46.0)
Hemoglobin: 10.5 g/dL — ABNORMAL LOW (ref 12.0–15.0)
MCH: 27.1 pg (ref 26.0–34.0)
MCHC: 30 g/dL (ref 30.0–36.0)
MCV: 90.4 fL (ref 80.0–100.0)
Platelets: 221 10*3/uL (ref 150–400)
RBC: 3.87 MIL/uL (ref 3.87–5.11)
RDW: 14.5 % (ref 11.5–15.5)
WBC: 5.9 10*3/uL (ref 4.0–10.5)
nRBC: 0 % (ref 0.0–0.2)

## 2021-09-12 LAB — URINE CULTURE: Culture: NO GROWTH

## 2021-09-12 LAB — LIPID PANEL
Cholesterol: 212 mg/dL — ABNORMAL HIGH (ref 0–200)
HDL: 66 mg/dL (ref >40)
LDL Cholesterol: 118 mg/dL — ABNORMAL HIGH (ref 0–99)
Total CHOL/HDL Ratio: 3.2 RATIO
Triglycerides: 138 mg/dL (ref <150)
VLDL: 28 mg/dL (ref 0–40)

## 2021-09-12 LAB — COMPREHENSIVE METABOLIC PANEL
ALT: 15 U/L (ref 0–44)
AST: 13 U/L — ABNORMAL LOW (ref 15–41)
Albumin: 2.6 g/dL — ABNORMAL LOW (ref 3.5–5.0)
Alkaline Phosphatase: 77 U/L (ref 38–126)
Anion gap: 7 (ref 5–15)
BUN: 42 mg/dL — ABNORMAL HIGH (ref 6–20)
CO2: 19 mmol/L — ABNORMAL LOW (ref 22–32)
Calcium: 9.7 mg/dL (ref 8.9–10.3)
Chloride: 109 mmol/L (ref 98–111)
Creatinine, Ser: 3.81 mg/dL — ABNORMAL HIGH (ref 0.44–1.00)
GFR, Estimated: 14 mL/min — ABNORMAL LOW (ref >60)
Glucose, Bld: 213 mg/dL — ABNORMAL HIGH (ref 70–99)
Potassium: 4.4 mmol/L (ref 3.5–5.1)
Sodium: 135 mmol/L (ref 135–145)
Total Bilirubin: 0.2 mg/dL — ABNORMAL LOW (ref 0.3–1.2)
Total Protein: 5.6 g/dL — ABNORMAL LOW (ref 6.5–8.1)

## 2021-09-12 LAB — GLUCOSE, CAPILLARY
Glucose-Capillary: 164 mg/dL — ABNORMAL HIGH (ref 70–99)
Glucose-Capillary: 227 mg/dL — ABNORMAL HIGH (ref 70–99)
Glucose-Capillary: 258 mg/dL — ABNORMAL HIGH (ref 70–99)

## 2021-09-12 MED ORDER — INSULIN DETEMIR 100 UNIT/ML ~~LOC~~ SOLN
20.0000 [IU] | Freq: Two times a day (BID) | SUBCUTANEOUS | Status: DC
  Filled 2021-09-12 (×2): qty 0.2

## 2021-09-12 MED ORDER — CARVEDILOL 25 MG PO TABS: 25.0000 mg | ORAL_TABLET | Freq: Two times a day (BID) | ORAL | 1 refills | Status: DC

## 2021-09-12 MED ORDER — INSULIN PEN NEEDLE 31G X 5 MM MISC: 0 refills | Status: DC

## 2021-09-12 MED ORDER — HYDRALAZINE HCL 10 MG PO TABS: 10.0000 mg | ORAL_TABLET | Freq: Three times a day (TID) | ORAL | 1 refills | Status: DC

## 2021-09-12 MED ORDER — INSULIN DETEMIR 100 UNIT/ML FLEXPEN
20.0000 [IU] | PEN_INJECTOR | Freq: Two times a day (BID) | SUBCUTANEOUS | 2 refills | Status: DC
Start: 2021-09-12 — End: 2023-04-01

## 2021-09-12 MED ORDER — INSULIN LISPRO 200 UNIT/ML ~~LOC~~ SOPN: PEN_INJECTOR | SUBCUTANEOUS | 2 refills | Status: DC

## 2021-09-12 MED ORDER — HYDRALAZINE HCL 10 MG PO TABS
10.0000 mg | ORAL_TABLET | Freq: Three times a day (TID) | ORAL | Status: DC
  Administered 2021-09-12: 10 mg via ORAL
  Filled 2021-09-12: qty 1

## 2021-09-12 NOTE — Discharge Summary (Signed)
?Physician Discharge Summary ?  ?Patient: Jasmine [name] MRN: [id number] DOB: June 24, 1970  ?Admit date:     09/08/2021  ?Discharge date: 09/12/21  ?Discharge Physician: Shanon Brow Gerturde Kuba  ? ?PCP: Aletha Halim., PA-C  ? ?Recommendations at discharge:  ? Please follow up with primary care provider within 1-2 weeks ? Please repeat BMP and CBC in one week ? ? ? ?Hospital Course: ?51 year old female with a history of insulin-dependent diabetes mellitus, CKD stage IV, migraine headaches, fibromyalgia, bipolar type II disorder, hyperlipidemia, hypothyroidism presenting with 1 day history of nausea, vomiting, and dizziness.  The patient also had an associated bifrontal headache.  She stated that she was not able to tolerate her antihypertensive medications.  However she stated that she last took her amlodipine on 09/07/2021.  In the emergency department, the patient was noted to be hypertensive and initially given intravenous metoprolol and hydralazine.  Although her BP initially improved, it climbed back up for which the patient was started on intravenous Cardene.  She was initially given 2 L of IV fluids.  routine blood work subsequently showed that the patient had a serum glucose of 796 with anion gap of 16.  She was started on IV fluids and IV insulin. ?The patient is a difficult historian.  However, she stated that she has not been completely compliant with her antihypertensive medications nor her insulin.  She is not able to elaborate how often she misses her medications.  She denies any chest pain, shortness breath, abdominal pain, diarrhea, hematemesis, hematochezia, melena. ?Blood work showed sodium 120, potassium 5.9, bicarbonate 18, serum creatinine 3.57.  LFTs were unremarkable.  WBC 15.3, hemoglobin 12.7, platelets 200,000.  She was started on IVF and IV insulin.  BP meds adjustments instituted to wean off cardene drip. ? ?Assessment and Plan: ?DKA (diabetic ketoacidosis) ([place]) ?-patient started on IV insulin with q  1 hour CBG check and q 4 hour BMPs ?-pt started on aggressive fluid resuscitation ?-Electrolytes were monitored and repleted ?-transitioned to [place] insulin once anion gap closed ?-diet was advanced once anion gap closed ?09/09/21-HbA1C--6.7 ? ? ?Acute renal failure superimposed on stage 4 chronic kidney disease ([place]) ?Baseline creatinine 2.7-3.0 ?Presented with serum creatinine 3.57 ?Secondary to volume depletion ?Continue IV fluids ?-suspect patient has new baseline now 3.5-3.8 ?-renal US--no hydronephrosis ?-outpt nephrology follow up ? ?Uncontrolled diabetes mellitus with hyperglycemia, with long-term current use of insulin ([facility]) ?4/5 hemoglobin A1c--6.7 ?Transition to [facility]  ?Asked patient to have spouse bring in insulin pump and Dexcom CGM ?Moderate novolog sliding scale ?After discussion with patient/spouse and DM coordinator, ptspouse both appear to have poor insight regarding ability to use insulin pump ?Also patient has not follow up with endocrine since May 2022 ?As such, pt will d/c home with levemir 20 units bid with lispro sliding scale ? ?HTN (hypertension) ?Restart carvedilol, amlodipine ?-increase coreg to 25 mg bid ?-wean cardene drip for SBP <160 ?-add hydralazine 10 mg tid ? ?Elevated troponin ?Troponin peaked 1366>>1217 ?IV heparin started 4/6 evening>>d/ced after 24 hours and remained stable ?Cardiology consult appreciated--felt this was demand ischemia ?Echo EF > 75%, no WMA ? ?Acute metabolic encephalopathy ?Multifactorial including hypercalcemia, hyponatremia, renal failure, meds, hypoglycemia ?UA--neg for pyurina ?Ammonia 25 ?B12--423 ?Folate--37.4 ?TSH 10.434 ?CT brain--no acute finding ?Hold cymbalta and gabapentin ?4/7--more alert ?4/8--back to baseline ? ?Hypercalcemia ?Intact PTH--10 ?Corrected calcium 11.0 at time of admission ?25 vitamin D--39.83 ?1,25 vit D--54.8 ?Outpatient endocrine follow up ? ?Mixed hyperlipidemia ?Continue statin ? ?GERD (gastroesophageal reflux disease) ?Continue  Protonix ? ?Chronic migraine w/o aura w/o status migrainosus, not intractable ?Holding Maxalt temporarily secondary to elevated blood pressure ?4/6--no headache presently ? ?Bipolar II disorder ([place]) ?Continue Cymbalta, Lamictal, bupropion ?Continue Ativan as needed ?Hold cymbalta and lamictal temporarily>>restarted ? ?Obesity, Class III, BMI 40-49.9 (morbid obesity) ([place]) ?BMI 48.09 ?Lifestyle modification ? ?Hypothyroidism ?TSH 10.434 ?Increase synthroid to 175 mcg daily ? ?Hyponatremia ?Secondary to volume depletion and hyperglycemia ?-Continue IV fluids>>improved ? ? ? ? ?  ? ? ?Consultants: none ?Procedures performed: none  ?Disposition: Home ?Diet recommendation:  ?Carb modified diet ?DISCHARGE MEDICATION: ?Allergies as of 09/12/2021   ? ?   Reactions  ? Ciprofloxacin Swelling, Other (See Comments)  ? Per pt caused lips swell and nauseous feeling  ? Levaquin [levofloxacin] Swelling, Other (See Comments)  ? Per pt caused lips swell and nauseous feeling  ? Buspar [buspirone] Other (See Comments)  ? abd cramping  ? Linaclotide Other (See Comments)  ? Advair Diskus [fluticasone-salmeterol] Other (See Comments)  ? Thrush   ? Biaxin [clarithromycin] Rash  ? Hydroxyzine Palpitations  ? ?  ? ?  ?Medication List  ?  ? ?STOP taking these medications   ? ?desipramine 25 MG tablet ?Commonly known as: NORPRAMIN ?  ?insulin glargine 100 UNIT/ML injection ?Commonly known as: LANTUS ?  ?NovoLOG 100 UNIT/ML injection ?Generic drug: insulin aspart ?  ? ?  ? ?TAKE these medications   ? ?amLODipine 5 MG tablet ?Commonly known as: NORVASC ?Take 1 tablet (5 mg total) by mouth daily. ?  ?APPLE CIDER VINEGAR PO ?Take 1 tablet by mouth daily. ?  ?aspirin EC 81 MG tablet ?Take 81 mg by mouth at bedtime. ?  ?buPROPion 300 MG 24 hr tablet ?Commonly known as: WELLBUTRIN XL ?Take 1 tablet (300 mg total) by mouth daily. ?  ?carvedilol 25 MG tablet ?Commonly known as: COREG ?Take 1 tablet (25 mg total) by mouth 2 (two) times daily with a  meal. ?What changed: how much to take ?  ?Cholecalciferol 50 MCG (2000 UT) Tabs ?Take 1 tablet by mouth daily. ?  ?Continuous Blood Gluc Sensor Misc ?1 each by Does not apply route as directed. Use as directed every 14 days. May dispense FreeStyle [employer] or similar. ?  ?COQ-10 PO ?Take 1 capsule by mouth daily. ?  ?dicyclomine 10 MG capsule ?Commonly known as: BENTYL ?Take 10 mg by mouth 3 (three) times daily as needed for spasms. ?  ?DULoxetine 60 MG capsule ?Commonly known as: CYMBALTA ?Take 1 capsule (60 mg total) by mouth daily. ?Start taking on: September 27, 2021 ?  ?FISH OIL PO ?Take 1 capsule by mouth daily. ?  ?fluticasone 50 MCG/ACT nasal spray ?Commonly known as: FLONASE ?Place 2 sprays into the nose daily as needed for allergies. ?  ?folic acid 382 MCG tablet ?Commonly known as: FOLVITE ?Take 400 mcg by mouth every evening. ?  ?furosemide 20 MG tablet ?Commonly known as: LASIX ?Take 20 mg by mouth daily. ?  ?gabapentin 300 MG capsule ?Commonly known as: NEURONTIN ?Take 300 mg by mouth 2 (two) times daily. One capsule in am and 2 capsules in evening ?  ?GlucaGen HypoKit 1 MG Solr injection ?Generic drug: glucagon ?GlucaGen HypoKit 1 mg Injection ?  ?hydrALAZINE 10 MG tablet ?Commonly known as: APRESOLINE ?Take 1 tablet (10 mg total) by mouth every 8 (eight) hours. ?  ?insulin detemir 100 UNIT/ML FlexPen ?Commonly known as: LEVEMIR ?Inject 20 Units into the skin 2 (two) times daily. ?  ?insulin lispro  200 UNIT/ML KwikPen ?Commonly known as: HUMALOG ?Sugar 70-120--no units; 121 to 150--2 units; 151 to 200--3 units;  201 to 250--5 units; 251 to 300--8 units; 301 to 350--11 units;  351 to 400--15 units ?Check AC/HS ?  ?Insulin Pen Needle 31G X 5 MM Misc ?Use with insulin pens to dispense insulin as directed ?  ?lamoTRIgine 200 MG tablet ?Commonly known as: LAMICTAL ?Take 1 tablet (200 mg total) by mouth daily. ?Start taking on: September 27, 2021 ?  ?levothyroxine 150 MCG tablet ?Commonly known as:  SYNTHROID ?Take 150 mcg by mouth daily before breakfast. ?  ?LORazepam 0.5 MG tablet ?Commonly known as: ATIVAN ?Take 1 tablet (0.5 mg total) by mouth daily as needed for anxiety. ?  ?omeprazole 20 MG capsule

## 2021-09-13 ENCOUNTER — Encounter (HOSPITAL_COMMUNITY): Payer: Self-pay | Admitting: Emergency Medicine

## 2021-09-13 ENCOUNTER — Other Ambulatory Visit: Payer: Self-pay

## 2021-09-13 ENCOUNTER — Emergency Department (HOSPITAL_COMMUNITY)
Admission: EM | Admit: 2021-09-13 | Discharge: 2021-09-13 | Disposition: A | Discharge status: Home / Self Care | Payer: BC Managed Care – PPO | Attending: Emergency Medicine | Admitting: Emergency Medicine

## 2021-09-13 DIAGNOSIS — Z5321 Procedure and treatment not carried out due to patient leaving prior to being seen by health care provider: Secondary | ICD-10-CM | POA: Diagnosis not present

## 2021-09-13 DIAGNOSIS — R109 Unspecified abdominal pain: Secondary | ICD-10-CM | POA: Diagnosis present

## 2021-09-13 DIAGNOSIS — R110 Nausea: Secondary | ICD-10-CM | POA: Diagnosis not present

## 2021-09-13 LAB — COMPREHENSIVE METABOLIC PANEL
ALT: 20 U/L (ref 0–44)
AST: 18 U/L (ref 15–41)
Albumin: 2.9 g/dL — ABNORMAL LOW (ref 3.5–5.0)
Alkaline Phosphatase: 96 U/L (ref 38–126)
Anion gap: 6 (ref 5–15)
BUN: 31 mg/dL — ABNORMAL HIGH (ref 6–20)
CO2: 22 mmol/L (ref 22–32)
Calcium: 10.9 mg/dL — ABNORMAL HIGH (ref 8.9–10.3)
Chloride: 107 mmol/L (ref 98–111)
Creatinine, Ser: 2.93 mg/dL — ABNORMAL HIGH (ref 0.44–1.00)
GFR, Estimated: 19 mL/min — ABNORMAL LOW (ref >60)
Glucose, Bld: 131 mg/dL — ABNORMAL HIGH (ref 70–99)
Potassium: 4 mmol/L (ref 3.5–5.1)
Sodium: 135 mmol/L (ref 135–145)
Total Bilirubin: 0.5 mg/dL (ref 0.3–1.2)
Total Protein: 6.4 g/dL — ABNORMAL LOW (ref 6.5–8.1)

## 2021-09-13 LAB — LIPASE, BLOOD: Lipase: 23 U/L (ref 11–51)

## 2021-09-13 LAB — CBC
HCT: 39.8 % (ref 36.0–46.0)
Hemoglobin: 13 g/dL (ref 12.0–15.0)
MCH: 28.3 pg (ref 26.0–34.0)
MCHC: 32.7 g/dL (ref 30.0–36.0)
MCV: 86.7 fL (ref 80.0–100.0)
Platelets: 295 10*3/uL (ref 150–400)
RBC: 4.59 MIL/uL (ref 3.87–5.11)
RDW: 14.2 % (ref 11.5–15.5)
WBC: 11 10*3/uL — ABNORMAL HIGH (ref 4.0–10.5)
nRBC: 0 % (ref 0.0–0.2)

## 2021-09-13 MED ORDER — ONDANSETRON 4 MG PO TBDP
4.0000 mg | ORAL_TABLET | Freq: Once | ORAL | Status: AC | PRN
Start: 2021-09-13 — End: 2021-09-13
  Administered 2021-09-13: 4 mg via ORAL
  Filled 2021-09-13: qty 1

## 2021-09-13 NOTE — ED Triage Notes (Signed)
Pt c/o LUQ.  Was just released yesterday from here due to dehydration and low oxygen per pt. Arrived a/o. Mildly pale. Cbg in route 174. This abd pain x 2 weeks now but today really bad. Nausea. Denies v/d. Lnbm 5-7 days ago ?

## 2021-09-17 ENCOUNTER — Ambulatory Visit (INDEPENDENT_AMBULATORY_CARE_PROVIDER_SITE_OTHER): Payer: BC Managed Care – PPO | Admitting: Pulmonary Disease

## 2021-09-17 ENCOUNTER — Telehealth: Payer: Self-pay | Admitting: Pulmonary Disease

## 2021-09-17 ENCOUNTER — Encounter: Payer: Self-pay | Admitting: Pulmonary Disease

## 2021-09-17 VITALS — BP 128/78 | HR 60 | Temp 98.0°F | Ht 69.0 in | Wt 331.0 lb

## 2021-09-17 DIAGNOSIS — G4733 Obstructive sleep apnea (adult) (pediatric): Secondary | ICD-10-CM

## 2021-09-17 DIAGNOSIS — I5032 Chronic diastolic (congestive) heart failure: Secondary | ICD-10-CM

## 2021-09-17 DIAGNOSIS — J9611 Chronic respiratory failure with hypoxia: Secondary | ICD-10-CM

## 2021-09-17 DIAGNOSIS — Z0289 Encounter for other administrative examinations: Secondary | ICD-10-CM

## 2021-09-17 NOTE — Telephone Encounter (Signed)
Called and notified patient that FMLA papers were completed. She voiced understanding and asked for them to be faxed to [facility] fax # [phone]. She would like for papers to  be left up front for her husband to pick up today or tomorrow. Copies made of FMLA documents and faxed over to [facility]. Papers left up front with Tonya for pick up.  ?

## 2021-09-17 NOTE — Progress Notes (Signed)
? ?  Subjective:  ? ? Patient ID: TEZRA MAHR, female    DOB: June 11, 1970, 51 y.o.   MRN: [id number] ? ?HPI ? ?51 yo IDDM  for FU of OSA & asthma, mild pulmonary hypertension. ?She reports longstanding history of asthma for which she takes albuterol MDI as needed.  This is worse during the pollen season ?  ?PMH -IDDM on insulin pump, CKD stage III, multiple lung nodules stable from 20 18-20 20 ?RA -diagnosed 02/2020 ([place])  ?Covid infection 05/2020 ? ?Chief Complaint  ?Patient presents with  ? Follow-up  ?  FMLA papers  ?Handicap Placard  ?CPAP working well but patient states sometimes she takes off during night   ? ?She arrives in a wheelchair today accompanied by her husband. ?She would like [place] to fill out handicap request and FMLA papers for her husband. ?She was recently hospitalized 4/4 to 4/8 for hypertension and hyperglycemia, CBG was 796.  ABG was 7.3 2/48/80 ?On discharge BUN/creatinine on 4/9 was 31/2.9 ?She required oxygen transiently.  In the past her presentation has been similar with transient hypoxia needing oxygen during hospitalizations. ?She does have her dad's oxygen machine which she uses at home. ? ?She denies any problems with CPAP mask or pressure.  She has a nasal mask and admits to an intermittent leak ?Husband has to take time off for her multiple doctor visits and hospitalizations.  For some reason PCP would not fill out his.  FMLA papers since "she sees too many specialist" ? ? ?Significant tests/ events reviewed ?Ambulation 10/05/19 OV >> dropped to 88%  ?04/2019 ABG 7.42/43/ 75   ?  ?CT chest 04/2019 no emphysema, stable nodules ?  ?PFTs 04/2019-no airway obstruction, ratio 87, FEV1 79%, FVC 72%, TLC 82%, DLCO 76% ?  ?Echo 04/2017 normal LVEF, RVSP 38 ?  ?  ?CPAP titration >> 15 cm, Sm FF mask ?HST showed severe OSA - worse than before - avg 37 events/ hr with drop in O2 levels ?HST 07/2016 - moderate OSA with AHI 25/hour especially worse when supine with AHI 35/hour and lowest  desaturation of 65% ? ?Review of Systems ?neg for any significant sore throat, dysphagia, itching, sneezing, nasal congestion or excess/ purulent secretions, fever, chills, sweats, unintended wt loss, pleuritic or exertional cp, hempoptysis, orthopnea pnd or change in chronic leg swelling. Also denies presyncope, palpitations, heartburn, abdominal pain, nausea, vomiting, diarrhea or change in bowel or urinary habits, dysuria,hematuria, rash, arthralgias, visual complaints, headache, numbness weakness or ataxia. ? ?   ?Objective:  ? Physical Exam ? ? ?Gen. Pleasant, obese, in no distress ?ENT - no lesions, no post nasal drip ?Neck: No JVD, no thyromegaly, no carotid bruits ?Lungs: no use of accessory muscles, no dullness to percussion, decreased without rales or rhonchi  ?Cardiovascular: Rhythm regular, heart sounds  normal, no murmurs or gallops, 2+ peripheral edema ?Musculoskeletal: No deformities, no cyanosis or clubbing , no tremors ? ? ? ? ?   ?Assessment & Plan:  ? ? ?

## 2021-09-17 NOTE — Patient Instructions (Signed)
?  X ONO on CPAP/RA ? ?Try to use CPAP at least 4 h every night ? ?Handicap papers filled out ?FMLA papers by tmoro ? ? ?

## 2021-09-17 NOTE — Assessment & Plan Note (Signed)
CPAP download was reviewed which shows good control of events on auto settings with average pressure of 13 cm with moderate leak.  Compliance is sporadic with occasional missed night but does average about 4 hours per night ?I advised her about improving her compliance ? ?Weight loss encouraged, compliance with goal of at least 4-6 hrs every night is the expectation. ?Advised against medications with sedative side effects ?Cautioned against driving when sleepy - understanding that sleepiness will vary on a day to day basis ? ?

## 2021-09-17 NOTE — Telephone Encounter (Signed)
Addressed during patients ov today. Patient aware we will call her when FMLA forms are completed. Was given handicap placard form before leaving.  ?

## 2021-09-17 NOTE — Assessment & Plan Note (Signed)
Lasix has been increased to 20 every 12 but may need further increase due to CKD, this is being managed by nephrology. ?FMLA papers will be filled out for her husband ?

## 2021-09-17 NOTE — Assessment & Plan Note (Addendum)
Transient hypoxia which is now resolved ?She has her dad's oxygen machine which she uses.  Previously she has noted to be transiently hypoxic during hospitalizations and has been attributed to diastolic heart failure ?She is debilitated due to knee pain and back pain and arrives in a wheelchair today.  Handicap placard will be filled out for 6 months, temporary and can be reconsidered at that point ?

## 2021-09-17 NOTE — Telephone Encounter (Signed)
Forms have been faxed, Meagan has placed copy in scan. ?

## 2021-09-24 ENCOUNTER — Ambulatory Visit: Payer: Medicare Other | Admitting: Neurology

## 2021-10-02 ENCOUNTER — Other Ambulatory Visit: Payer: Self-pay | Admitting: Psychiatry

## 2021-10-02 DIAGNOSIS — F3181 Bipolar II disorder: Secondary | ICD-10-CM

## 2021-10-03 ENCOUNTER — Other Ambulatory Visit: Payer: Self-pay | Admitting: Psychiatry

## 2021-10-05 ENCOUNTER — Ambulatory Visit: Payer: Medicare Other | Admitting: Neurology

## 2021-10-13 ENCOUNTER — Ambulatory Visit: Payer: Medicare Other | Admitting: Neurology

## 2021-10-19 ENCOUNTER — Ambulatory Visit (HOSPITAL_BASED_OUTPATIENT_CLINIC_OR_DEPARTMENT_OTHER): Payer: Medicare Other | Admitting: Cardiology
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
## 2021-10-22 ENCOUNTER — Telehealth (INDEPENDENT_AMBULATORY_CARE_PROVIDER_SITE_OTHER): Payer: BC Managed Care – PPO | Admitting: Psychiatry

## 2021-10-22 ENCOUNTER — Encounter: Payer: Self-pay | Admitting: Psychiatry

## 2021-10-22 DIAGNOSIS — F419 Anxiety disorder, unspecified: Secondary | ICD-10-CM

## 2021-10-22 DIAGNOSIS — F3181 Bipolar II disorder: Secondary | ICD-10-CM | POA: Diagnosis not present

## 2021-10-22 NOTE — Patient Instructions (Signed)
Continue lamotrigine 200 mg daily Continue duloxetine 60 mg daily  Continue bupropion 300 mg daily   Continue lorazepam 0.5 mg daily as needed for anxiety  Next appointment: 7/10 at 10 Am, in person

## 2021-10-26 ENCOUNTER — Encounter: Payer: Self-pay | Admitting: Pulmonary Disease

## 2021-10-26 NOTE — Telephone Encounter (Signed)
ONO results received from [employer]. Please advise

## 2021-11-06 NOTE — Telephone Encounter (Signed)
ONO on CPAP/room air showed desaturation less than 88% for 2 hours. Continue oxygen blended into CPAP

## 2021-11-07 ENCOUNTER — Other Ambulatory Visit: Payer: Self-pay | Admitting: Psychiatry

## 2021-11-07 DIAGNOSIS — F3181 Bipolar II disorder: Secondary | ICD-10-CM

## 2021-11-23 ENCOUNTER — Ambulatory Visit: Payer: Medicare Other | Admitting: Neurology

## 2021-11-25 ENCOUNTER — Ambulatory Visit (HOSPITAL_BASED_OUTPATIENT_CLINIC_OR_DEPARTMENT_OTHER): Payer: Medicare Other | Admitting: Cardiology

## 2021-12-01 ENCOUNTER — Ambulatory Visit: Payer: Medicare Other | Admitting: Neurology

## 2021-12-09 ENCOUNTER — Other Ambulatory Visit: Payer: Self-pay | Admitting: Psychiatry

## 2021-12-09 DIAGNOSIS — F3181 Bipolar II disorder: Secondary | ICD-10-CM
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
## 2021-12-14 ENCOUNTER — Encounter: Payer: Self-pay | Admitting: Psychiatry

## 2021-12-14 ENCOUNTER — Ambulatory Visit (INDEPENDENT_AMBULATORY_CARE_PROVIDER_SITE_OTHER): Payer: BC Managed Care – PPO | Admitting: Psychiatry

## 2021-12-14 VITALS — BP 126/74 | HR 58 | Temp 98.7°F | Wt 297.2 lb

## 2021-12-14 DIAGNOSIS — F411 Generalized anxiety disorder: Secondary | ICD-10-CM

## 2021-12-14 DIAGNOSIS — F3181 Bipolar II disorder: Secondary | ICD-10-CM | POA: Diagnosis not present

## 2021-12-14 MED ORDER — LAMOTRIGINE 200 MG PO TABS: 200.0000 mg | ORAL_TABLET | Freq: Every day | ORAL | 3 refills | Status: DC

## 2021-12-14 MED ORDER — BUPROPION HCL ER (XL) 300 MG PO TB24: 300.0000 mg | ORAL_TABLET | Freq: Every day | ORAL | 5 refills | Status: DC

## 2021-12-14 MED ORDER — DULOXETINE HCL 60 MG PO CPEP: 60.0000 mg | ORAL_CAPSULE | Freq: Every day | ORAL | 3 refills | Status: DC

## 2021-12-14 MED ORDER — LORAZEPAM 0.5 MG PO TABS: 0.5000 mg | ORAL_TABLET | Freq: Every day | ORAL | 1 refills | Status: DC | PRN

## 2021-12-14 NOTE — Patient Instructions (Signed)
Continue lamotrigine 200 mg daily Continue duloxetine 60 mg daily  Continue bupropion 300 mg daily  Continue lorazepam 0.5 mg daily as needed for anxiety- refills left Consider contacting [facility] for therapy [phone]    Next appointment: 9/13 at 2:30, video

## 2022-01-18 ENCOUNTER — Other Ambulatory Visit (HOSPITAL_BASED_OUTPATIENT_CLINIC_OR_DEPARTMENT_OTHER): Payer: Self-pay | Admitting: Cardiology

## 2022-01-18 DIAGNOSIS — I10 Essential (primary) hypertension: Secondary | ICD-10-CM

## 2022-01-18 NOTE — Telephone Encounter (Signed)
Rx(s) sent to pharmacy electronically.  

## 2022-01-22 ENCOUNTER — Ambulatory Visit (HOSPITAL_BASED_OUTPATIENT_CLINIC_OR_DEPARTMENT_OTHER): Payer: Medicare Other | Admitting: Cardiology

## 2022-02-02 ENCOUNTER — Ambulatory Visit: Payer: Medicare Other | Admitting: Neurology

## 2022-02-05 ENCOUNTER — Other Ambulatory Visit: Payer: Self-pay | Admitting: Psychiatry

## 2022-02-05 DIAGNOSIS — F3181 Bipolar II disorder: Secondary | ICD-10-CM
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
## 2022-02-17 ENCOUNTER — Telehealth (INDEPENDENT_AMBULATORY_CARE_PROVIDER_SITE_OTHER): Payer: BC Managed Care – PPO | Admitting: Psychiatry

## 2022-02-17 ENCOUNTER — Encounter: Payer: Self-pay | Admitting: Psychiatry

## 2022-02-17 DIAGNOSIS — F3181 Bipolar II disorder: Secondary | ICD-10-CM

## 2022-02-17 DIAGNOSIS — F411 Generalized anxiety disorder: Secondary | ICD-10-CM

## 2022-02-17 MED ORDER — LORAZEPAM 0.5 MG PO TABS: 0.5000 mg | ORAL_TABLET | Freq: Every day | ORAL | 2 refills | Status: DC | PRN

## 2022-03-01 ENCOUNTER — Ambulatory Visit (HOSPITAL_BASED_OUTPATIENT_CLINIC_OR_DEPARTMENT_OTHER): Payer: Medicare Other | Admitting: Cardiology

## 2022-03-15 ENCOUNTER — Encounter: Payer: Self-pay | Admitting: Neurology

## 2022-03-15 ENCOUNTER — Ambulatory Visit (INDEPENDENT_AMBULATORY_CARE_PROVIDER_SITE_OTHER): Payer: BC Managed Care – PPO | Admitting: Neurology

## 2022-03-15 VITALS — BP 182/92 | HR 71 | Ht 69.0 in

## 2022-03-15 DIAGNOSIS — E1142 Type 2 diabetes mellitus with diabetic polyneuropathy: Secondary | ICD-10-CM | POA: Diagnosis not present

## 2022-03-15 DIAGNOSIS — I679 Cerebrovascular disease, unspecified: Secondary | ICD-10-CM

## 2022-03-15 DIAGNOSIS — G43709 Chronic migraine without aura, not intractable, without status migrainosus: Secondary | ICD-10-CM

## 2022-03-15 DIAGNOSIS — I951 Orthostatic hypotension: Secondary | ICD-10-CM

## 2022-03-15 MED ORDER — PYRIDOSTIGMINE BROMIDE 60 MG PO TABS: 60.0000 mg | ORAL_TABLET | Freq: Three times a day (TID) | ORAL | 6 refills | Status: DC
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
## 2022-03-16 LAB — IRON,TIBC AND FERRITIN PANEL
Ferritin: 29 ng/mL (ref 15–150)
Iron Saturation: 19 % (ref 15–55)
Iron: 62 ug/dL (ref 27–159)
Total Iron Binding Capacity: 329 ug/dL (ref 250–450)
UIBC: 267 ug/dL (ref 131–425)

## 2022-03-16 LAB — VITAMIN B12: Vitamin B-12: 487 pg/mL (ref 232–1245)

## 2022-03-16 LAB — FOLATE: Folate: >20 ng/mL (ref >3.0)

## 2022-03-18 ENCOUNTER — Telehealth: Payer: Self-pay | Admitting: Neurology

## 2022-03-18 NOTE — Telephone Encounter (Signed)
[employer] via [place] sent to GI [phone]

## 2022-03-22 ENCOUNTER — Ambulatory Visit (INDEPENDENT_AMBULATORY_CARE_PROVIDER_SITE_OTHER): Payer: BC Managed Care – PPO | Admitting: Cardiology

## 2022-03-22 ENCOUNTER — Encounter (HOSPITAL_BASED_OUTPATIENT_CLINIC_OR_DEPARTMENT_OTHER): Payer: Self-pay | Admitting: Cardiology

## 2022-03-22 VITALS — BP 138/76 | HR 67 | Ht 69.0 in | Wt 289.2 lb

## 2022-03-22 DIAGNOSIS — R0789 Other chest pain: Secondary | ICD-10-CM

## 2022-03-22 DIAGNOSIS — I10 Essential (primary) hypertension: Secondary | ICD-10-CM

## 2022-03-22 DIAGNOSIS — J9611 Chronic respiratory failure with hypoxia: Secondary | ICD-10-CM

## 2022-03-22 DIAGNOSIS — G4733 Obstructive sleep apnea (adult) (pediatric): Secondary | ICD-10-CM | POA: Diagnosis not present

## 2022-03-22 DIAGNOSIS — E1022 Type 1 diabetes mellitus with diabetic chronic kidney disease: Secondary | ICD-10-CM | POA: Diagnosis not present

## 2022-03-22 DIAGNOSIS — N185 Chronic kidney disease, stage 5: Secondary | ICD-10-CM

## 2022-03-22 MED ORDER — AMLODIPINE BESYLATE 10 MG PO TABS: 10.0000 mg | ORAL_TABLET | Freq: Every day | ORAL | 3 refills | Status: DC

## 2022-03-22 NOTE — Patient Instructions (Addendum)
Medication Instructions:  INCREASE: Amlodipine 10 mg daily  *If you need a refill on your cardiac medications before your next appointment, please call your pharmacy*   Lab Work: None ordered today   Testing/Procedures: Cardiac PET [facility]   Follow-Up: At [facility], you and your health needs are our priority.  As part of our continuing mission to provide you with exceptional heart care, we have created designated Provider Care Teams.  These Care Teams include your primary Cardiologist (physician) and Advanced Practice Providers (APPs -  Physician Assistants and Nurse Practitioners) who all work together to provide you with the care you need, when you need it.  We recommend signing up for the patient portal called "MyChart".  Sign up information is provided on this After Visit Summary.  MyChart is used to connect with patients for Virtual Visits (Telemedicine).  Patients are able to view lab/test results, encounter notes, upcoming appointments, etc.  Non-urgent messages can be sent to your provider as well.   To learn more about what you can do with MyChart, go to [email or web address].    Your next appointment:   4 month(s)  The format for your next appointment:   In Person  Provider:   Buford Dresser, MD    How to Prepare for Your Cardiac PET/CT Stress Test:  1. Please do not take these medications before your test:   Medications that may interfere with the cardiac pharmacological stress agent (ex. nitrates - including erectile dysfunction medications or beta-blockers) the day of the exam. Hold Carvedilol morning of the test  Your remaining medications may be taken with water.  2. Nothing to eat or drink, except water, 3 hours prior to arrival time.   NO caffeine/decaffeinated products, or chocolate 12 hours prior to arrival.  3. NO perfume, cologne or lotion  4. Total time is 1 to 2 hours; you may want to bring reading material for the  waiting time.  5. Please report to Admitting at the [facility] Main Entrance 60 minutes early for your test.  [place], [place] [address]  Diabetic Preparation:  Hold oral medications. You may take NPH and Lantus insulin. Do not take Humalog or Humulin R (Regular Insulin) the day of your test. Check blood sugars prior to leaving the house. If able to eat breakfast prior to 3 hour fasting, you may take all medications, including your insulin, Do not worry if you miss your breakfast dose of insulin - start at your next meal.  IF YOU THINK YOU MAY BE PREGNANT, OR ARE NURSING PLEASE INFORM THE TECHNOLOGIST.  In preparation for your appointment, medication and supplies will be purchased.  Appointment availability is limited, so if you need to cancel or reschedule, please call the Radiology Department at [phone]  24 hours in advance to avoid a cancellation fee of $100.00  What to Expect After you Arrive:  Once you arrive and check in for your appointment, you will be taken to a preparation room within the Radiology Department.  A technologist or Nurse will obtain your medical history, verify that you are correctly prepped for the exam, and explain the procedure.  Afterwards,  an IV will be started in your arm and electrodes will be placed on your skin for EKG monitoring during the stress portion of the exam. Then you will be escorted to the PET/CT scanner.  There, staff will get you positioned on the scanner and obtain a blood pressure and EKG.  During the exam, you will continue to be connected to the EKG and blood pressure machines.  A small, safe amount of a radioactive tracer will be injected in your IV to obtain a series of pictures of your heart along with an injection of a stress agent.    After your Exam:  It is recommended that you eat a meal and drink a caffeinated beverage to counter act any effects of the stress agent.  Drink plenty of fluids for the  remainder of the day and urinate frequently for the first couple of hours after the exam.  Your doctor will inform you of your test results within 7-10 business days.  For questions about your test or how to prepare for your test, please call: Marchia Bond, Cardiac Imaging Nurse Navigator  Gordy Clement, Cardiac Imaging Nurse Navigator Office: [phone]

## 2022-03-22 NOTE — Progress Notes (Signed)
Cardiology Office Note:    Date:  03/22/2022   ID:  Jasmine [name], DOB 16-Jul-1970, MRN [id number]  PCP:  Aletha Halim., PA-C  Cardiologist:  Buford Dresser, MD  Referring MD: Aletha Halim., PA-C   CC: follow up  History of Present Illness:    Jasmine [name] is a 51 y.o. female with a hx of severe OSA, chronic respiratory failure with hypoxia, hypertension, type I diabetes, hypothyroidism, CKD stage 5, prior tobacco use, rheumatoid arthritis who is seen for follow up today. I initially saw her 04/22/20 as a new consult at the request of Aletha Halim., PA-C for the evaluation and management of diastolic dysfunction.  Pertinent cards history: On my review, echo 04/11/2017 showed normal LV systolic and diastolic function. Normal RV. Normal IVC, RAP 3 mmHg. PASP 38 mmHg. Had a CPX 08/03/2017, notable for immediate desaturations with exercise to 87%, improved during recovery. Mild to moderate functional limitation, no cardiovascular limitation. There was chronotropic incompetence, with peak heart rate of 81 bpm.  Echo done at [facility] 03/20/20 notes normal LV systolic function, grade 1 diastolic dysfunction, normal RV. Pulm pressures not estimated due to insufficient TR jet. IVC not well visualized. E/A 0.8, E/e' 12.8. TR peak velocity noted at 3 m/s, which could be extrapolated to 36 mmHg. TAPSE normal.  She presented to the ED and was admitted to the hospital on 09/08/2021 with the complaint of dizziness. She reported this dizziness began a day prior to her visit, as well as a headache. She was unsure of when her last blood pressure medication dose, but she believed it was 2 nights prior. Her blood pressure was elevated at 197/85 and she was recommended to restart her home medications.   Today:  She is accompanied by a family member. She says she has been okay.  She reports that sometimes she will feel chest pressure and tightness "like someone is sitting on my chest." She  has been experiencing this chest pressure for a few months now. Sometimes when she walks for her exercise, she will feel this chest pressure. Most often it occurs when she is laying in bed at night. She has attributed it to anxiety. She does not usually perform much strenuous activity besides house cleaning.   Her blood pressure has been elevated and she was recently in the hospital because of it. She states that 130/90 is usually the lowest reading she sees at home. Last week her blood pressure was around 735 systolic.   She denies any palpitations, shortness of breath, or peripheral edema. No lightheadedness, headaches, syncope, orthopnea, or PND.  Past Medical History:  Diagnosis Date   Anxiety    Arthritis    Asthma    Balance problems    Bipolar disorder ([place])    Charcot ankle    Chronic fatigue    Chronic kidney disease    STAGE 3-4   Depression    Diabetes mellitus    DKA, type 1 ([place]) 11/04/2011   Elevated cholesterol    Fibromyalgia    GERD (gastroesophageal reflux disease)    Headache    History of suicidal ideation    Hyperlipemia    Hypertension    Hypothyroidism    IBS (irritable bowel syndrome)    Memory changes    Obesity    Sleep apnea    HAS C -PAP / DOES NOT USE   Stress incontinence    Pt had surgery to correct this.   Tachycardia  Tobacco abuse    Tremor    UTI (lower urinary tract infection)     Past Surgical History:  Procedure Laterality Date   INCONTINENCE SURGERY     NASAL FRACTURE SURGERY     ovary removed     OVARY SURGERY     PUBOVAGINAL SLING  08/16/2011   Procedure: [name];  Surgeon: Bernestine Amass, MD;  Location: [facility];  Service: Urology;  Laterality: N/A;          UTERINE FIBROID SURGERY  2001    Current Medications: Current Outpatient Medications on File Prior to Visit  Medication Sig   APPLE CIDER VINEGAR PO Take 1 tablet by mouth daily.   aspirin EC 81 MG tablet Take 81 mg by mouth at bedtime.    buPROPion  (WELLBUTRIN XL) 300 MG 24 hr tablet Take 1 tablet (300 mg total) by mouth daily.   carvedilol (COREG) 3.125 MG tablet Take 1 tablet by mouth 2 (two) times daily.   Cholecalciferol 50 MCG (2000 UT) TABS Take 1 tablet by mouth daily.    Coenzyme Q10 (COQ-10 PO) Take 1 capsule by mouth daily.   Continuous Blood Gluc Sensor MISC 1 each by Does not apply route as directed. Use as directed every 14 days. May dispense FreeStyle [employer] or similar.   dicyclomine (BENTYL) 10 MG capsule Take 10 mg by mouth 3 (three) times daily as needed for spasms.   DULoxetine (CYMBALTA) 60 MG capsule Take 1 capsule (60 mg total) by mouth daily.   fluticasone (FLONASE) 50 MCG/ACT nasal spray Place 2 sprays into the nose daily as needed for allergies.    folic acid (FOLVITE) 967 MCG tablet Take 400 mcg by mouth every evening.    furosemide (LASIX) 20 MG tablet Take 20 mg by mouth daily.    gabapentin (NEURONTIN) 300 MG capsule Take 300 mg by mouth 2 (two) times daily. One capsule in am and 2 capsules in evening   glucagon (GLUCAGEN HYPOKIT) 1 MG SOLR injection GlucaGen HypoKit 1 mg Injection   hydrALAZINE (APRESOLINE) 10 MG tablet Take 1 tablet (10 mg total) by mouth every 8 (eight) hours.   insulin detemir (LEVEMIR) 100 UNIT/ML FlexPen Inject 20 Units into the skin 2 (two) times daily. (Patient taking differently: Inject 20 Units into the skin as needed.)   Insulin Pen Needle 31G X 5 MM MISC Use with insulin pens to dispense insulin as directed   lamoTRIgine (LAMICTAL) 200 MG tablet Take 1 tablet (200 mg total) by mouth daily.   levothyroxine (SYNTHROID) 175 MCG tablet TAKE ONE TABLET BY MOUTH DAILY AT 6am   LORazepam (ATIVAN) 0.5 MG tablet Take 1 tablet (0.5 mg total) by mouth daily as needed for anxiety.   NOVOLOG 100 UNIT/ML injection Inject into the skin. Sliding Scale   Omega-3 Fatty Acids (FISH OIL PO) Take 1 capsule by mouth daily.   omeprazole (PRILOSEC) 20 MG capsule Take 20 mg by mouth at bedtime.     ondansetron (ZOFRAN ODT) 4 MG disintegrating tablet Take 1 tablet (4 mg total) by mouth every 8 (eight) hours as needed.   oxyCODONE (OXY IR/ROXICODONE) 5 MG immediate release tablet Take 5 mg by mouth 4 (four) times daily as needed.   OXYGEN Inhale 3 L into the lungs as needed.   PROAIR HFA 108 (90 Base) MCG/ACT inhaler Inhale 1-2 puffs into the lungs every 6 (six) hours as needed for wheezing or shortness of breath.    rizatriptan (MAXALT) 10  MG tablet Take 1 tablet (10 mg total) by mouth as needed for migraine. May repeat in 2 hours if needed   pyridostigmine (MESTINON) 60 MG tablet Take 1 tablet (60 mg total) by mouth 3 (three) times daily. (Patient not taking: Reported on 03/22/2022)   No current facility-administered medications on file prior to visit.     Allergies:   Ciprofloxacin, Levaquin [levofloxacin], Buspar [buspirone], Linaclotide, Advair diskus [fluticasone-salmeterol], Biaxin [clarithromycin], and Hydroxyzine   Social History   Tobacco Use   Smoking status: Former    Packs/day: 0.75    Years: 20.00    Total pack years: 15.00    Types: Cigarettes    Quit date: 06/08/2011    Years since quitting: 10.7   Smokeless tobacco: Never  Vaping Use   Vaping Use: Never used  Substance Use Topics   Alcohol use: No   Drug use: No    Family History: family history includes Asthma in her mother; Bipolar disorder in her mother; Bladder Cancer in her paternal grandfather; Cancer in her paternal grandmother; Diabetes in her father; Heart disease in her father; Hyperlipidemia in her father; Hypertension in her father; Lymphoma in her father; Suicidality in her maternal grandfather; Thyroid disease in her brother and father. Gena Fray had MI, Mat Gma had MI, father has enlarged heart.   ROS:   Please see the history of present illness.   (+) Chest pressure  Additional pertinent ROS otherwise unremarkable.    EKGs/Labs/Other Studies Reviewed:    The following studies were reviewed  today:  Echo 09/11/2021: Sonographer Comments: Patient is morbidly obese. Image acquisition  challenging due to respiratory motion.   IMPRESSIONS    1. Left ventricular ejection fraction, by estimation, is >75%. The left  ventricle has hyperdynamic function. The left ventricle has no regional  wall motion abnormalities. The left ventricular internal cavity size was  mildly dilated. There is mild left  ventricular hypertrophy. Left ventricular diastolic parameters were  normal.   2. Right ventricular systolic function is normal. The right ventricular  size is normal. There is mildly elevated pulmonary artery systolic  pressure.   3. The mitral valve is normal in structure. Mild mitral valve  regurgitation.   4. The aortic valve is tricuspid. Aortic valve regurgitation is not  visualized.    CT Head 09/10/2021: FINDINGS: Brain: Age-advanced moderate cerebral atrophy and atrophic ventriculomegaly with moderate hypoattenuating white matter disease. Pattern and distribution of the white matter disease suggests chronic microvascular white matter disease but given age could also have a component in demyelinating disease.   Cerebellum and brainstem are normal in attenuation and volume. Study is somewhat motion limited but no obvious cortical based acute infarct, hemorrhage, mass effect or midline shift are suspected. Basal cisterns are clear.   Vascular: Age advanced calcification of the carotid siphons, distal left vertebral artery. No hyperdense central vasculature.   Skull: The calvarium, skull base and orbits are intact. No skull lesion is seen.   Sinuses/Orbits: No acute finding.   Other: None.   IMPRESSION: 1. Stable moderate age-advanced cerebral atrophy and hypoattenuating white matter disease, the latter most likely reflecting small-vessel disease, demyelinating disease also possible given age. Please correlate clinically. 2. Motion limited exam with no obvious acute  intracranial CT abnormality. Follow-up with MRI if there is concern for occult infarct. 3. Age advanced carotid atherosclerosis.   EKG:  EKG is personally reviewed.   03/22/22: NSR at 67 bpm, PRWP 04/15/2021: NSR at 61 bpm, PRWP 04/22/20: nsr at  68 bpm  Recent Labs: 09/09/2021: TSH 10.434 09/10/2021: Magnesium 1.9 09/13/2021: ALT 20; BUN 31; Creatinine, Ser 2.93; Hemoglobin 13.0; Platelets 295; Potassium 4.0; Sodium 135   Recent Lipid Panel    Component Value Date/Time   CHOL 212 (H) 09/12/2021 0338   TRIG 138 09/12/2021 0338   HDL 66 09/12/2021 0338   CHOLHDL 3.2 09/12/2021 0338   VLDL 28 09/12/2021 0338   LDLCALC 118 (H) 09/12/2021 0338    Physical Exam:    VS:  BP 138/76 (BP Location: Left Arm, Patient Position: Sitting, Cuff Size: Large)   Pulse 67   Ht 5\' 9"  (1.753 m)   Wt 289 lb 3.2 oz (131.2 kg)   LMP 03/23/2017 (Approximate)   BMI 42.71 kg/m     Wt Readings from Last 3 Encounters:  03/22/22 289 lb 3.2 oz (131.2 kg)  09/17/21 (!) 331 lb (150.1 kg)  09/10/21 (!) 325 lb 9.9 oz (147.7 kg)    GEN: Well nourished, well developed in no acute distress. In wheelchair. HEENT: Normal, moist mucous membranes NECK: No JVD at 90 degrees CARDIAC: regular rhythm, normal S1 and S2, no rubs or gallops. No murmur. VASCULAR: Radial and DP pulses 2+ bilaterally. No carotid bruits RESPIRATORY:  Clear to auscultation without rales, wheezing or rhonchi  ABDOMEN: Soft, non-tender, non-distended MUSCULOSKELETAL:  Ambulates independently SKIN: Warm and dry, no edema NEUROLOGIC:  Alert and oriented x 3. No focal neuro deficits noted. PSYCHIATRIC:  Normal affect   ASSESSMENT:    1. Chest tightness   2. Primary hypertension   3. Type 1 diabetes mellitus with stage 5 chronic kidney disease not on chronic dialysis ([place])   4. OSA (obstructive sleep apnea)   5. Chronic respiratory failure with hypoxia ([facility])     PLAN:    Chest tightness: both exertional and nonexertional. Reports as  stable, mild. May be noncardiac, but given type II diabetes, needs further evaluation. Unfortunately, cannot have contrast due to CKD stage 5. Body habitus limits utility of myoview. Best option is likely cardiac PET. She is on an insulin pump. Unclear to me how this affects rubidium. Will reach out to PET team for management instructions. -reviewed red flag warning signs that need immediate medical attention  Chronic respiratory failure with hypoxia, on O2 at night, with severe sleep apnea -this has been attributed to both chronic diastolic heart failure and pulmonary hypertension. However, I can find little evidence for these based on available data -see analysis of her prior echo above -we again discussed today signs/symptoms that would be concerning for right heart failure. She has no JVD, no swelling, and is saturating well on room air. No indication for RHC at this time.  Chronotropic incompetence -per CPX report -suspect this was due to high dose beta blocker. Asymptomatic at this time. Carvedilol has been decreased  Hypertension: -blood pressure has been rising since carvedilol decreased -currently taking carvedilol 3.125 mg BID, furosemide 20 mg daily, amlodipine 5 mg daily, hydralazine 10 mg (usually BID) -increase amlodipine to 10 mg daily. Monitor LE edema, home blood pressures  Type I diabetes, with stage 5 chronic kidney disease not on dialysis -continue pravastatin, aspirin for primary prevention -last Cr 3.82  Morbid obesity -BMI 42  Cardiac risk counseling and prevention recommendations: -recommend heart healthy/Mediterranean diet, with whole grains, fruits, vegetable, fish, lean meats, nuts, and olive oil. Limit salt. -recommend moderate walking, 3-5 times/week for 30-50 minutes each session. Aim for at least 150 minutes.week. Goal should be pace of 3 miles/hours,  or walking 1.5 miles in 30 minutes -recommend avoidance of tobacco products. Avoid excess alcohol. -ASCVD  risk score: The 10-year ASCVD risk score (Arnett DK, et al., 2019) is: 3.2%   Values used to calculate the score:     Age: 44 years     Sex: Female     Is Non-Hispanic African American: No     Diabetic: Yes     Tobacco smoker: No     Systolic Blood Pressure: 166 mmHg     Is BP treated: Yes     HDL Cholesterol: 66 mg/dL     Total Cholesterol: 212 mg/dL   Plan for follow up: 4 months.  Buford Dresser, MD, PhD [place]  [facility]    Medication Adjustments/Labs and Tests Ordered: Current medicines are reviewed at length with the patient today.  Concerns regarding medicines are outlined above.   Orders Placed This Encounter  Procedures   EKG 12-Lead   Meds ordered this encounter  Medications   amLODipine (NORVASC) 10 MG tablet    Sig: Take 1 tablet (10 mg total) by mouth daily.    Dispense:  90 tablet    Refill:  3    This prescription was filled on 01/18/2022. Any refills authorized will be placed on file.   Patient Instructions  Medication Instructions:  INCREASE: Amlodipine 10 mg daily  *If you need a refill on your cardiac medications before your next appointment, please call your pharmacy*   Lab Work: None ordered today   Testing/Procedures: Cardiac PET [facility]   Follow-Up: At [facility], you and your health needs are our priority.  As part of our continuing mission to provide you with exceptional heart care, we have created designated Provider Care Teams.  These Care Teams include your primary Cardiologist (physician) and Advanced Practice Providers (APPs -  Physician Assistants and Nurse Practitioners) who all work together to provide you with the care you need, when you need it.  We recommend signing up for the patient portal called "MyChart".  Sign up information is provided on this After Visit Summary.  MyChart is used to connect with patients for Virtual Visits (Telemedicine).  Patients are able to view lab/test results,  encounter notes, upcoming appointments, etc.  Non-urgent messages can be sent to your provider as well.   To learn more about what you can do with MyChart, go to [email or web address].    Your next appointment:   4 month(s)  The format for your next appointment:   In Person  Provider:   Buford Dresser, MD    How to Prepare for Your Cardiac PET/CT Stress Test:  1. Please do not take these medications before your test:   Medications that may interfere with the cardiac pharmacological stress agent (ex. nitrates - including erectile dysfunction medications or beta-blockers) the day of the exam. Hold Carvedilol morning of the test  Your remaining medications may be taken with water.  2. Nothing to eat or drink, except water, 3 hours prior to arrival time.   NO caffeine/decaffeinated products, or chocolate 12 hours prior to arrival.  3. NO perfume, cologne or lotion  4. Total time is 1 to 2 hours; you may want to bring reading material for the waiting time.  5. Please report to Admitting at the [facility] Main Entrance 60 minutes early for your test.  [place], [place] [id number]  Diabetic Preparation:  Hold oral medications. You may take NPH and Lantus  insulin. Do not take Humalog or Humulin R (Regular Insulin) the day of your test. Check blood sugars prior to leaving the house. If able to eat breakfast prior to 3 hour fasting, you may take all medications, including your insulin, Do not worry if you miss your breakfast dose of insulin - start at your next meal.  IF YOU THINK YOU MAY BE PREGNANT, OR ARE NURSING PLEASE INFORM THE TECHNOLOGIST.  In preparation for your appointment, medication and supplies will be purchased.  Appointment availability is limited, so if you need to cancel or reschedule, please call the Radiology Department at [phone]  24 hours in advance to avoid a cancellation fee of $100.00  What to Expect After you  Arrive:  Once you arrive and check in for your appointment, you will be taken to a preparation room within the Radiology Department.  A technologist or Nurse will obtain your medical history, verify that you are correctly prepped for the exam, and explain the procedure.  Afterwards,  an IV will be started in your arm and electrodes will be placed on your skin for EKG monitoring during the stress portion of the exam. Then you will be escorted to the PET/CT scanner.  There, staff will get you positioned on the scanner and obtain a blood pressure and EKG.  During the exam, you will continue to be connected to the EKG and blood pressure machines.  A small, safe amount of a radioactive tracer will be injected in your IV to obtain a series of pictures of your heart along with an injection of a stress agent.    After your Exam:  It is recommended that you eat a meal and drink a caffeinated beverage to counter act any effects of the stress agent.  Drink plenty of fluids for the remainder of the day and urinate frequently for the first couple of hours after the exam.  Your doctor will inform you of your test results within 7-10 business days.  For questions about your test or how to prepare for your test, please call: Marchia Bond, Cardiac Imaging Nurse Navigator  Gordy Clement, Cardiac Imaging Nurse Navigator Office: [phone]           I,Breanna Adamick,acting as a scribe for Buford Dresser, MD.,have documented all relevant documentation on the behalf of Buford Dresser, MD,as directed by  Buford Dresser, MD while in the presence of Buford Dresser, MD.  I, Buford Dresser, MD, have reviewed all documentation for this visit. The documentation on 03/22/22 for the exam, diagnosis, procedures, and orders are all accurate and complete.   Signed, Buford Dresser, MD PhD 03/22/2022 9:29 AM    [place]

## 2022-03-26 ENCOUNTER — Encounter (HOSPITAL_BASED_OUTPATIENT_CLINIC_OR_DEPARTMENT_OTHER): Payer: Self-pay

## 2022-03-29 ENCOUNTER — Ambulatory Visit (HOSPITAL_COMMUNITY): Payer: BC Managed Care – PPO

## 2022-03-29 NOTE — Telephone Encounter (Signed)
Please advise 

## 2022-03-31 ENCOUNTER — Ambulatory Visit
Admission: RE | Admit: 2022-03-31 | Discharge: 2022-03-31 | Disposition: A | Discharge status: Home / Self Care | Payer: Medicare Other | Source: Ambulatory Visit | Attending: Neurology | Admitting: Neurology

## 2022-03-31 DIAGNOSIS — E1142 Type 2 diabetes mellitus with diabetic polyneuropathy: Secondary | ICD-10-CM

## 2022-03-31 DIAGNOSIS — I951 Orthostatic hypotension: Secondary | ICD-10-CM

## 2022-03-31 DIAGNOSIS — I679 Cerebrovascular disease, unspecified: Secondary | ICD-10-CM

## 2022-03-31 DIAGNOSIS — G43709 Chronic migraine without aura, not intractable, without status migrainosus: Secondary | ICD-10-CM

## 2022-04-05 ENCOUNTER — Encounter: Payer: Self-pay | Admitting: Neurology

## 2022-04-09 ENCOUNTER — Ambulatory Visit (INDEPENDENT_AMBULATORY_CARE_PROVIDER_SITE_OTHER): Payer: BC Managed Care – PPO | Admitting: Pulmonary Disease

## 2022-04-09 ENCOUNTER — Encounter: Payer: Self-pay | Admitting: Pulmonary Disease

## 2022-04-09 VITALS — BP 132/84 | HR 72 | Temp 97.6°F | Ht 69.0 in | Wt 279.0 lb

## 2022-04-09 DIAGNOSIS — G4733 Obstructive sleep apnea (adult) (pediatric): Secondary | ICD-10-CM

## 2022-04-09 DIAGNOSIS — J9611 Chronic respiratory failure with hypoxia: Secondary | ICD-10-CM | POA: Diagnosis not present

## 2022-04-09 NOTE — Assessment & Plan Note (Signed)
We reviewed nocturnal oximetry which showed significant desaturation. She will continue on oxygen blended into CPAP

## 2022-04-09 NOTE — Assessment & Plan Note (Signed)
CPAP download was reviewed which shows excellent control of events on auto settings 10 to 15 cm with average pressure of 12 and max pressure of 13 cm.  She has a large leak.  Compliance has definitely improved more than 5.5 hours per night.  CPAP is only helped improve her daytime somnolence and fatigue  Weight loss encouraged, compliance with goal of at least 4-6 hrs every night is the expectation. Advised against medications with sedative side effects Cautioned against driving when sleepy - understanding that sleepiness will vary on a day to day basis

## 2022-04-09 NOTE — Patient Instructions (Signed)
X Rx for nasal pillows

## 2022-04-09 NOTE — Progress Notes (Signed)
   Subjective:    Patient ID: Jasmine [name], female    DOB: 1971-02-20, 51 y.o.   MRN: [id number]  HPI  51  yo IDDM  for FU of OSA & asthma, mild pulmonary hypertension. She reports longstanding history of asthma for which she takes albuterol MDI as needed.  This is worse during the pollen season   PMH -IDDM on insulin pump, CKD stage III, multiple lung nodules stable from 20 18-20 20 RA -diagnosed 02/2020 ([place])  Covid infection 05/2020  hospitalized 09/2021 for hypertension and hyperglycemia, CBG was 796.  ABG was 7.32/48/80 On discharge BUN/creatinine on 4/9 was 31/2.9 She required oxygen transiently.   Chief Complaint  Patient presents with   Follow-up    Cpap working well would like to try nasal pillows    90-month follow-up visit.  Breathing is okay. She arrives in a wheelchair her knees are  giving her trouble, she is following up with orthopedics. She uses albuterol as needed during change of season. She is compliant with CPAP machine and this certainly helps improve her daytime somnolence and fatigue. She could not tolerate a full facemask and compliance improved after switching to a nasal mask.  But this is now hurting nostrils and she would like to try nasal pillows.  Pressure is okay and she does not complain of significant dryness. We reviewed nocturnal oximetry and she is continuing on oxygen       Significant tests/ events reviewed  10/2021 ONO on CPAP/RA >> desat >2h  Ambulation 10/05/19 OV >> dropped to 88%  04/2019 ABG 7.42/43/ 75     CT chest 04/2019 no emphysema, stable nodules   PFTs 04/2019-no airway obstruction, ratio 87, FEV1 79%, FVC 72%, TLC 82%, DLCO 76%   Echo 04/2017 normal LVEF, RVSP 38     CPAP titration >> 15 cm, Sm FF mask HST showed severe OSA - worse than before - avg 37 events/ hr with drop in O2 levels HST 07/2016 - moderate OSA with AHI 25/hour especially worse when supine with AHI 35/hour and lowest desaturation of 65%  Review of  Systems  neg for any significant sore throat, dysphagia, itching, sneezing, nasal congestion or excess/ purulent secretions, fever, chills, sweats, unintended wt loss, pleuritic or exertional cp, hempoptysis, orthopnea pnd or change in chronic leg swelling. Also denies presyncope, palpitations, heartburn, abdominal pain, nausea, vomiting, diarrhea or change in bowel or urinary habits, dysuria,hematuria, rash, arthralgias, visual complaints, headache, numbness weakness or ataxia.     Objective:   Physical Exam  Gen. Pleasant, obese, in no distress ENT - no lesions, no post nasal drip Neck: No JVD, no thyromegaly, no carotid bruits Lungs: no use of accessory muscles, no dullness to percussion, decreased without rales or rhonchi  Cardiovascular: Rhythm regular, heart sounds  normal, no murmurs or gallops, no peripheral edema Musculoskeletal: No deformities, no cyanosis or clubbing , no tremors         Assessment & Plan:   Asthma -mild intermittent, continue albuterol on as-needed basis especially during change of season. Flu vaccination up-to-date

## 2022-04-09 NOTE — Addendum Note (Signed)
Addended by: Fritzi Mandes D on: 04/09/2022 12:28 PM   Modules accepted: Orders

## 2022-04-17 ENCOUNTER — Other Ambulatory Visit: Payer: Self-pay | Admitting: Neurology

## 2022-05-03 ENCOUNTER — Emergency Department (HOSPITAL_COMMUNITY)
Admission: EM | Admit: 2022-05-03 | Discharge: 2022-05-03 | Discharge status: Against Medical Advice | Payer: BC Managed Care – PPO

## 2022-05-03 ENCOUNTER — Ambulatory Visit (HOSPITAL_COMMUNITY): Payer: Medicare Other

## 2022-05-10 ENCOUNTER — Ambulatory Visit (HOSPITAL_COMMUNITY): Admission: RE | Admit: 2022-05-10 | Payer: BC Managed Care – PPO | Source: Ambulatory Visit
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
## 2022-05-12 ENCOUNTER — Telehealth (INDEPENDENT_AMBULATORY_CARE_PROVIDER_SITE_OTHER): Payer: BC Managed Care – PPO | Admitting: Psychiatry

## 2022-05-12 ENCOUNTER — Encounter: Payer: Self-pay | Admitting: Psychiatry

## 2022-05-12 DIAGNOSIS — F3181 Bipolar II disorder: Secondary | ICD-10-CM

## 2022-05-12 DIAGNOSIS — F411 Generalized anxiety disorder: Secondary | ICD-10-CM | POA: Diagnosis not present

## 2022-05-12 MED ORDER — LORAZEPAM 0.5 MG PO TABS: 0.5000 mg | ORAL_TABLET | Freq: Every day | ORAL | 1 refills | Status: DC | PRN

## 2022-05-12 MED ORDER — DULOXETINE HCL 60 MG PO CPEP: 60.0000 mg | ORAL_CAPSULE | Freq: Every day | ORAL | 3 refills | Status: DC

## 2022-05-12 MED ORDER — LAMOTRIGINE 200 MG PO TABS: 200.0000 mg | ORAL_TABLET | Freq: Every day | ORAL | 4 refills | Status: DC

## 2022-05-12 NOTE — Patient Instructions (Addendum)
Continue lamotrigine 200 mg daily Continue duloxetine 60 mg daily  Continue bupropion 300 mg daily   Continue lorazepam 0.5 mg daily as needed for anxiety  Next appointment: 2/14 at 10:30

## 2022-05-13 ENCOUNTER — Ambulatory Visit (HOSPITAL_COMMUNITY)
Admission: RE | Admit: 2022-05-13 | Discharge: 2022-05-13 | Disposition: A | Discharge status: Home / Self Care | Payer: BC Managed Care – PPO | Source: Ambulatory Visit | Attending: Neurology | Admitting: Neurology

## 2022-05-13 DIAGNOSIS — E1142 Type 2 diabetes mellitus with diabetic polyneuropathy: Secondary | ICD-10-CM | POA: Diagnosis not present

## 2022-05-13 DIAGNOSIS — I679 Cerebrovascular disease, unspecified: Secondary | ICD-10-CM | POA: Diagnosis not present

## 2022-05-13 DIAGNOSIS — I951 Orthostatic hypotension: Secondary | ICD-10-CM | POA: Insufficient documentation

## 2022-05-13 DIAGNOSIS — G43709 Chronic migraine without aura, not intractable, without status migrainosus: Secondary | ICD-10-CM | POA: Insufficient documentation

## 2022-05-13 NOTE — Progress Notes (Signed)
VASCULAR LAB    Carotid duplex has been performed.  See CV proc for preliminary results.   Jawad Wiacek, RVT 05/13/2022, 9:53 AM

## 2022-07-15 ENCOUNTER — Other Ambulatory Visit: Payer: Self-pay | Admitting: Psychiatry

## 2022-07-15 ENCOUNTER — Other Ambulatory Visit (HOSPITAL_BASED_OUTPATIENT_CLINIC_OR_DEPARTMENT_OTHER): Payer: Self-pay | Admitting: Cardiology

## 2022-07-15 DIAGNOSIS — F3181 Bipolar II disorder: Secondary | ICD-10-CM

## 2022-07-15 DIAGNOSIS — I10 Essential (primary) hypertension: Secondary | ICD-10-CM
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
## 2022-07-21 ENCOUNTER — Telehealth (INDEPENDENT_AMBULATORY_CARE_PROVIDER_SITE_OTHER): Payer: BC Managed Care – PPO | Admitting: Psychiatry

## 2022-07-21 ENCOUNTER — Encounter: Payer: Self-pay | Admitting: Psychiatry

## 2022-07-21 DIAGNOSIS — F411 Generalized anxiety disorder: Secondary | ICD-10-CM | POA: Diagnosis not present

## 2022-07-21 DIAGNOSIS — F3181 Bipolar II disorder: Secondary | ICD-10-CM | POA: Diagnosis not present

## 2022-07-21 MED ORDER — LORAZEPAM 0.5 MG PO TABS: 0.5000 mg | ORAL_TABLET | Freq: Every day | ORAL | 1 refills | Status: DC | PRN

## 2022-07-21 MED ORDER — BUPROPION HCL ER (XL) 300 MG PO TB24: 300.0000 mg | ORAL_TABLET | Freq: Every day | ORAL | 5 refills | Status: DC

## 2022-07-21 NOTE — Patient Instructions (Signed)
Continue lamotrigine 200 mg daily Continue duloxetine 60 mg daily  Continue bupropion 300 mg daily  Continue lorazepam 0.5 mg daily as needed for anxiety  Next appointment: 4/17 at 11 am

## 2022-07-30 ENCOUNTER — Ambulatory Visit (HOSPITAL_BASED_OUTPATIENT_CLINIC_OR_DEPARTMENT_OTHER): Payer: BC Managed Care – PPO | Admitting: Cardiology

## 2022-08-06 ENCOUNTER — Encounter (HOSPITAL_BASED_OUTPATIENT_CLINIC_OR_DEPARTMENT_OTHER): Payer: Self-pay | Admitting: Cardiology

## 2022-08-06 ENCOUNTER — Ambulatory Visit (INDEPENDENT_AMBULATORY_CARE_PROVIDER_SITE_OTHER): Payer: BC Managed Care – PPO | Admitting: Cardiology

## 2022-08-06 VITALS — BP 128/72 | HR 64 | Ht 69.0 in | Wt 278.5 lb

## 2022-08-06 DIAGNOSIS — I10 Essential (primary) hypertension: Secondary | ICD-10-CM

## 2022-08-06 DIAGNOSIS — E1022 Type 1 diabetes mellitus with diabetic chronic kidney disease: Secondary | ICD-10-CM | POA: Diagnosis not present

## 2022-08-06 DIAGNOSIS — G4733 Obstructive sleep apnea (adult) (pediatric): Secondary | ICD-10-CM | POA: Diagnosis not present

## 2022-08-06 DIAGNOSIS — R0789 Other chest pain: Secondary | ICD-10-CM

## 2022-08-06 DIAGNOSIS — N185 Chronic kidney disease, stage 5: Secondary | ICD-10-CM

## 2022-08-06 NOTE — Progress Notes (Signed)
Cardiology Office Note:    Date:  08/06/2022   ID:  Jasmine [name], DOB September 28, 1970, MRN [id number]  PCP:  Aletha Halim., PA-C  Cardiologist:  Buford Dresser, MD  Referring MD: Aletha Halim., PA-C   CC: follow up  History of Present Illness:    Jasmine [name] is a 52 y.o. female with a hx of severe OSA, chronic respiratory failure with hypoxia, hypertension, type I diabetes, hypothyroidism, CKD stage 5, prior tobacco use, rheumatoid arthritis who is seen for follow up today. I initially saw her 04/22/20 as a new consult at the request of Aletha Halim., PA-C for the evaluation and management of diastolic dysfunction.  Pertinent cards history: On my review, echo 04/11/2017 showed normal LV systolic and diastolic function. Normal RV. Normal IVC, RAP 3 mmHg. PASP 38 mmHg. Had a CPX 08/03/2017, notable for immediate desaturations with exercise to 87%, improved during recovery. Mild to moderate functional limitation, no cardiovascular limitation. There was chronotropic incompetence, with peak heart rate of 81 bpm.  Echo done at [facility] 03/20/20 notes normal LV systolic function, grade 1 diastolic dysfunction, normal RV. Pulm pressures not estimated due to insufficient TR jet. IVC not well visualized. E/A 0.8, E/e' 12.8. TR peak velocity noted at 3 m/s, which could be extrapolated to 36 mmHg. TAPSE normal.  Today, she is not feeling well. She has been told that she will be on peritoneal dialysis soon.  Lately her most strenuous activities have been her normal daily routines. She states she can't walk very far without some kind of aid. She is mostly limited by her legs. No significant LE edema.  Her intermittent chest pressure is unchanged from prior, most common when laying down at night.  She denies any palpitations, shortness of breath, or peripheral edema. No lightheadedness, headaches, syncope, orthopnea, or PND.   Past Medical History:  Diagnosis Date   Anxiety     Arthritis    Asthma    Balance problems    Bipolar disorder ([place])    Charcot ankle    Chronic fatigue    Chronic kidney disease    STAGE 3-4   Depression    Diabetes mellitus    DKA, type 1 ([name]) 11/04/2011   Elevated cholesterol    Fibromyalgia    GERD (gastroesophageal reflux disease)    Headache    History of suicidal ideation    Hyperlipemia    Hypertension    Hypothyroidism    IBS (irritable bowel syndrome)    Memory changes    Obesity    Sleep apnea    HAS C -PAP / DOES NOT USE   Stress incontinence    Pt had surgery to correct this.   Tachycardia    Tobacco abuse    Tremor    UTI (lower urinary tract infection)     Past Surgical History:  Procedure Laterality Date   INCONTINENCE SURGERY     NASAL FRACTURE SURGERY     ovary removed     OVARY SURGERY     PUBOVAGINAL SLING  08/16/2011   Procedure: [name];  Surgeon: Bernestine Amass, MD;  Location: [facility];  Service: Urology;  Laterality: N/A;          UTERINE FIBROID SURGERY  2001    Current Medications: Current Outpatient Medications on File Prior to Visit  Medication Sig   amLODipine (NORVASC) 5 MG tablet Take 5 mg by mouth daily.   APPLE CIDER VINEGAR PO Take 1 tablet  by mouth daily.   aspirin EC 81 MG tablet Take 81 mg by mouth at bedtime.    buPROPion (WELLBUTRIN XL) 300 MG 24 hr tablet Take 1 tablet (300 mg total) by mouth daily.   carvedilol (COREG) 3.125 MG tablet Take 1 tablet by mouth 2 (two) times daily.   Cholecalciferol 50 MCG (2000 UT) TABS Take 1 tablet by mouth daily.    Coenzyme Q10 (COQ-10 PO) Take 1 capsule by mouth daily.   Continuous Blood Gluc Sensor MISC 1 each by Does not apply route as directed. Use as directed every 14 days. May dispense FreeStyle [employer] or similar.   dicyclomine (BENTYL) 10 MG capsule Take 10 mg by mouth 3 (three) times daily as needed for spasms.   DULoxetine (CYMBALTA) 60 MG capsule Take 1 capsule (60 mg total) by mouth daily.    fluticasone (FLONASE) 50 MCG/ACT nasal spray Place 2 sprays into the nose daily as needed for allergies.    folic acid (FOLVITE) A999333 MCG tablet Take 400 mcg by mouth every evening.    furosemide (LASIX) 20 MG tablet Take 20 mg by mouth daily.    gabapentin (NEURONTIN) 300 MG capsule Take 100 mg by mouth daily.   glucagon (GLUCAGEN HYPOKIT) 1 MG SOLR injection GlucaGen HypoKit 1 mg Injection   hydrALAZINE (APRESOLINE) 10 MG tablet Take 1 tablet (10 mg total) by mouth every 8 (eight) hours.   insulin detemir (LEVEMIR) 100 UNIT/ML FlexPen Inject 20 Units into the skin 2 (two) times daily. (Patient taking differently: Inject 20 Units into the skin as needed.)   Insulin Pen Needle 31G X 5 MM MISC Use with insulin pens to dispense insulin as directed   lamoTRIgine (LAMICTAL) 200 MG tablet Take 1 tablet (200 mg total) by mouth daily.   levothyroxine (SYNTHROID) 175 MCG tablet TAKE ONE TABLET BY MOUTH DAILY AT 6am   [START ON 08/14/2022] LORazepam (ATIVAN) 0.5 MG tablet Take 1 tablet (0.5 mg total) by mouth daily as needed for anxiety.   NOVOLOG 100 UNIT/ML injection Inject into the skin. Sliding Scale   Omega-3 Fatty Acids (FISH OIL PO) Take 1 capsule by mouth daily.   omeprazole (PRILOSEC) 20 MG capsule Take 20 mg by mouth at bedtime.    ondansetron (ZOFRAN ODT) 4 MG disintegrating tablet Take 1 tablet (4 mg total) by mouth every 8 (eight) hours as needed.   oxyCODONE-acetaminophen (PERCOCET/ROXICET) 5-325 MG tablet Take by mouth 4 (four) times daily as needed for severe pain.   PROAIR HFA 108 (90 Base) MCG/ACT inhaler Inhale 1-2 puffs into the lungs every 6 (six) hours as needed for wheezing or shortness of breath.    rizatriptan (MAXALT) 10 MG tablet Take 1 tablet (10 mg total) by mouth as needed for migraine. May repeat in 2 hours if needed   OXYGEN Inhale 3 L into the lungs as needed.   No current facility-administered medications on file prior to visit.     Allergies:   Ciprofloxacin, Levaquin  [levofloxacin], Buspar [buspirone], Linaclotide, Advair diskus [fluticasone-salmeterol], Biaxin [clarithromycin], and Hydroxyzine   Social History   Tobacco Use   Smoking status: Former    Packs/day: 0.75    Years: 20.00    Total pack years: 15.00    Types: Cigarettes    Quit date: 06/08/2011    Years since quitting: 11.1   Smokeless tobacco: Never  Vaping Use   Vaping Use: Never used  Substance Use Topics   Alcohol use: No   Drug use:  No    Family History: family history includes Asthma in her mother; Bipolar disorder in her mother; Bladder Cancer in her paternal grandfather; Cancer in her paternal grandmother; Diabetes in her father; Heart disease in her father; Hyperlipidemia in her father; Hypertension in her father; Lymphoma in her father; Suicidality in her maternal grandfather; Thyroid disease in her brother and father. Gena Fray had MI, Mat Gma had MI, father has enlarged heart.   ROS:   Please see the history of present illness.   Additional pertinent ROS otherwise unremarkable.    EKGs/Labs/Other Studies Reviewed:    The following studies were reviewed today:  Bilateral Carotid Dopplers  05/13/2022: Summary:  Right Carotid: The extracranial vessels were near-normal with only minimal  wall thickening or plaque.   Left Carotid: The extracranial vessels were near-normal with only minimal  wall thickening or plaque.   Vertebrals:  Bilateral vertebral arteries demonstrate antegrade flow.  Subclavians: Normal flow hemodynamics were seen in bilateral subclavian               arteries.   Echo 09/11/2021: Sonographer Comments: Patient is morbidly obese. Image acquisition  challenging due to respiratory motion.   IMPRESSIONS    1. Left ventricular ejection fraction, by estimation, is >75%. The left  ventricle has hyperdynamic function. The left ventricle has no regional  wall motion abnormalities. The left ventricular internal cavity size was  mildly dilated. There is  mild left  ventricular hypertrophy. Left ventricular diastolic parameters were  normal.   2. Right ventricular systolic function is normal. The right ventricular  size is normal. There is mildly elevated pulmonary artery systolic  pressure.   3. The mitral valve is normal in structure. Mild mitral valve  regurgitation.   4. The aortic valve is tricuspid. Aortic valve regurgitation is not  visualized.    CT Head 09/10/2021: FINDINGS: Brain: Age-advanced moderate cerebral atrophy and atrophic ventriculomegaly with moderate hypoattenuating white matter disease. Pattern and distribution of the white matter disease suggests chronic microvascular white matter disease but given age could also have a component in demyelinating disease.   Cerebellum and brainstem are normal in attenuation and volume. Study is somewhat motion limited but no obvious cortical based acute infarct, hemorrhage, mass effect or midline shift are suspected. Basal cisterns are clear.   Vascular: Age advanced calcification of the carotid siphons, distal left vertebral artery. No hyperdense central vasculature.   Skull: The calvarium, skull base and orbits are intact. No skull lesion is seen.   Sinuses/Orbits: No acute finding.   Other: None.   IMPRESSION: 1. Stable moderate age-advanced cerebral atrophy and hypoattenuating white matter disease, the latter most likely reflecting small-vessel disease, demyelinating disease also possible given age. Please correlate clinically. 2. Motion limited exam with no obvious acute intracranial CT abnormality. Follow-up with MRI if there is concern for occult infarct. 3. Age advanced carotid atherosclerosis.   EKG:  EKG is personally reviewed.   08/06/2022:  EKG was not ordered. 03/22/22: NSR at 67 bpm, PRWP 04/15/2021: NSR at 61 bpm, PRWP 04/22/20: nsr at 68 bpm  Recent Labs: 09/09/2021: TSH 10.434 09/10/2021: Magnesium 1.9 09/13/2021: ALT 20; BUN 31; Creatinine, Ser 2.93;  Hemoglobin 13.0; Platelets 295; Potassium 4.0; Sodium 135   Recent Lipid Panel    Component Value Date/Time   CHOL 212 (H) 09/12/2021 0338   TRIG 138 09/12/2021 0338   HDL 66 09/12/2021 0338   CHOLHDL 3.2 09/12/2021 0338   VLDL 28 09/12/2021 0338   LDLCALC 118 (H)  09/12/2021 0338    Physical Exam:    VS:  BP 128/72   Pulse 64   Ht '5\' 9"'$  (1.753 m)   Wt 278 lb 8 oz (126.3 kg)   LMP 03/23/2017 (Approximate)   BMI 41.13 kg/m     Wt Readings from Last 3 Encounters:  08/06/22 278 lb 8 oz (126.3 kg)  04/09/22 279 lb (126.6 kg)  03/22/22 289 lb 3.2 oz (131.2 kg)    GEN: Well nourished, well developed in no acute distress.  HEENT: Normal, moist mucous membranes NECK: No JVD at 90 degrees CARDIAC: regular rhythm, normal S1 and S2, no rubs or gallops. No murmur. VASCULAR: Radial and DP pulses 2+ bilaterally. No carotid bruits RESPIRATORY:  Clear to auscultation without rales, wheezing or rhonchi  ABDOMEN: Soft, non-tender, non-distended MUSCULOSKELETAL:  Ambulates independently SKIN: Warm and dry, no edema NEUROLOGIC:  Alert and oriented x 3. No focal neuro deficits noted. PSYCHIATRIC:  Normal affect   ASSESSMENT:    1. Chest tightness   2. Type 1 diabetes mellitus with stage 5 chronic kidney disease not on chronic dialysis ([place])   3. Primary hypertension   4. OSA (obstructive sleep apnea)     PLAN:    Chest tightness: stable. Previously discussed cardiac PET. Given stability/atypical nature of symptoms, will hold on stress test at this time as she will likely require evaluation as part of the kidney transplant process. If her symptoms change she will contact me -reviewed red flag warning signs that need immediate medical attention  Chronic respiratory failure with hypoxia, on O2 at night, with severe sleep apnea -this has been attributed to both chronic diastolic heart failure and pulmonary hypertension. However, I can find little evidence for these based on available  data -see analysis of her prior echo above -we again discussed today signs/symptoms that would be concerning for right heart failure. She has no JVD, no swelling, and is saturating well on room air. No indication for RHC at this time.  Chronotropic incompetence -per CPX report -suspect this was due to high dose beta blocker. Asymptomatic at this time. Carvedilol has been decreased  Hypertension: -at goal today, continue current meds  Type I diabetes, with stage 5 chronic kidney disease now pending start of peritoneal dialysis -continue pravastatin, aspirin for primary prevention -followed closely by nephrology -has started renal transplant evaluation process with [facility]  Morbid obesity -BMI 41  Cardiac risk counseling and prevention recommendations: -recommend heart healthy/Mediterranean diet, with whole grains, fruits, vegetable, fish, lean meats, nuts, and olive oil. Limit salt. -recommend moderate walking, 3-5 times/week for 30-50 minutes each session. Aim for at least 150 minutes.week. Goal should be pace of 3 miles/hours, or walking 1.5 miles in 30 minutes -recommend avoidance of tobacco products. Avoid excess alcohol. -ASCVD risk score: The 10-year ASCVD risk score (Arnett DK, et al., 2019) is: 2.8%   Values used to calculate the score:     Age: 27 years     Sex: Female     Is Non-Hispanic African American: No     Diabetic: Yes     Tobacco smoker: No     Systolic Blood Pressure: 0000000 mmHg     Is BP treated: Yes     HDL Cholesterol: 66 mg/dL     Total Cholesterol: 212 mg/dL    Plan for follow up:  3 months or sooner as needed.  Buford Dresser, MD, PhD [place]  [facility]    Medication Adjustments/Labs and Tests Ordered: Current medicines  are reviewed at length with the patient today.  Concerns regarding medicines are outlined above.   No orders of the defined types were placed in this encounter.  No orders of the defined types were placed in this  encounter.  Patient Instructions  Medication Instructions:  Your physician recommends that you continue on your current medications as directed. Please refer to the Current Medication list given to you today.  *If you need a refill on your cardiac medications before your next appointment, please call your pharmacy*  Lab Work: NONE  Testing/Procedures: NONE  Follow-Up: At [facility], you and your health needs are our priority.  As part of our continuing mission to provide you with exceptional heart care, we have created designated Provider Care Teams.  These Care Teams include your primary Cardiologist (physician) and Advanced Practice Providers (APPs -  Physician Assistants and Nurse Practitioners) who all work together to provide you with the care you need, when you need it.  We recommend signing up for the patient portal called "MyChart".  Sign up information is provided on this After Visit Summary.  MyChart is used to connect with patients for Virtual Visits (Telemedicine).  Patients are able to view lab/test results, encounter notes, upcoming appointments, etc.  Non-urgent messages can be sent to your provider as well.   To learn more about what you can do with MyChart, go to [email or web address].    Your next appointment:   3 month(s)  The format for your next appointment:   In Person  Provider:   Buford Dresser, MD      [facility] Stumpf,acting as a scribe for Buford Dresser, MD.,have documented all relevant documentation on the behalf of Buford Dresser, MD,as directed by  Buford Dresser, MD while in the presence of Buford Dresser, MD.  I, Buford Dresser, MD, have reviewed all documentation for this visit. The documentation on 08/06/22 for the exam, diagnosis, procedures, and orders are all accurate and complete.   Signed, Buford Dresser, MD PhD 08/06/2022 1:50 PM    [facility]

## 2022-08-06 NOTE — Patient Instructions (Signed)
Medication Instructions:  Your physician recommends that you continue on your current medications as directed. Please refer to the Current Medication list given to you today.  *If you need a refill on your cardiac medications before your next appointment, please call your pharmacy*  Lab Work: NONE  Testing/Procedures: NONE  Follow-Up: At [facility], you and your health needs are our priority.  As part of our continuing mission to provide you with exceptional heart care, we have created designated Provider Care Teams.  These Care Teams include your primary Cardiologist (physician) and Advanced Practice Providers (APPs -  Physician Assistants and Nurse Practitioners) who all work together to provide you with the care you need, when you need it.  We recommend signing up for the patient portal called "MyChart".  Sign up information is provided on this After Visit Summary.  MyChart is used to connect with patients for Virtual Visits (Telemedicine).  Patients are able to view lab/test results, encounter notes, upcoming appointments, etc.  Non-urgent messages can be sent to your provider as well.   To learn more about what you can do with MyChart, go to [email or web address].    Your next appointment:   3 month(s)  The format for your next appointment:   In Person  Provider:   Buford Dresser, MD

## 2022-09-22 ENCOUNTER — Telehealth: Payer: Medicare Other | Admitting: Psychiatry
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
## 2022-09-28 ENCOUNTER — Other Ambulatory Visit: Payer: Self-pay | Admitting: Gastroenterology

## 2022-09-28 ENCOUNTER — Ambulatory Visit: Payer: Medicare Other | Admitting: Family Medicine

## 2022-09-28 DIAGNOSIS — I679 Cerebrovascular disease, unspecified: Secondary | ICD-10-CM

## 2022-09-28 DIAGNOSIS — I951 Orthostatic hypotension: Secondary | ICD-10-CM

## 2022-09-28 DIAGNOSIS — G43709 Chronic migraine without aura, not intractable, without status migrainosus: Secondary | ICD-10-CM

## 2022-09-28 DIAGNOSIS — Z1211 Encounter for screening for malignant neoplasm of colon: Secondary | ICD-10-CM

## 2022-09-28 DIAGNOSIS — E1142 Type 2 diabetes mellitus with diabetic polyneuropathy: Secondary | ICD-10-CM
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
## 2022-10-05 ENCOUNTER — Other Ambulatory Visit: Payer: Self-pay | Admitting: Psychiatry

## 2022-10-05 DIAGNOSIS — F3181 Bipolar II disorder: Secondary | ICD-10-CM

## 2022-10-06 ENCOUNTER — Encounter: Payer: Self-pay | Admitting: Psychiatry

## 2022-10-06 ENCOUNTER — Telehealth (INDEPENDENT_AMBULATORY_CARE_PROVIDER_SITE_OTHER): Payer: BC Managed Care – PPO | Admitting: Psychiatry

## 2022-10-06 DIAGNOSIS — F411 Generalized anxiety disorder: Secondary | ICD-10-CM | POA: Diagnosis not present

## 2022-10-06 DIAGNOSIS — F3181 Bipolar II disorder: Secondary | ICD-10-CM

## 2022-10-06 DIAGNOSIS — F603 Borderline personality disorder: Secondary | ICD-10-CM | POA: Diagnosis not present

## 2022-10-06 DIAGNOSIS — G4700 Insomnia, unspecified: Secondary | ICD-10-CM | POA: Diagnosis not present

## 2022-10-06 MED ORDER — DULOXETINE HCL 60 MG PO CPEP: 60.0000 mg | ORAL_CAPSULE | Freq: Every day | ORAL | 3 refills | Status: DC

## 2022-10-06 MED ORDER — RAMELTEON 8 MG PO TABS: 8.0000 mg | ORAL_TABLET | Freq: Every day | ORAL | 1 refills | Status: DC

## 2022-10-06 NOTE — Patient Instructions (Signed)
Continue lamotrigine 200 mg daily  Continue duloxetine 60 mg daily  Continue bupropion 300 mg daily  Continue lorazepam 0.5 mg daily as needed for anxiety  Start ramelteon 8 mg at night as needed for insomnia  Next appointment: 6/20 at 4 pm

## 2022-10-15 ENCOUNTER — Encounter (HOSPITAL_BASED_OUTPATIENT_CLINIC_OR_DEPARTMENT_OTHER): Payer: Self-pay

## 2022-10-15 ENCOUNTER — Ambulatory Visit (HOSPITAL_BASED_OUTPATIENT_CLINIC_OR_DEPARTMENT_OTHER): Payer: BC Managed Care – PPO | Admitting: Cardiology

## 2022-10-25 ENCOUNTER — Other Ambulatory Visit: Payer: Self-pay | Admitting: Psychiatry

## 2022-10-25 DIAGNOSIS — F3181 Bipolar II disorder: Secondary | ICD-10-CM

## 2022-11-04 ENCOUNTER — Other Ambulatory Visit: Payer: Self-pay | Admitting: Psychiatry

## 2022-11-04 DIAGNOSIS — F3181 Bipolar II disorder: Secondary | ICD-10-CM

## 2022-11-05 ENCOUNTER — Ambulatory Visit: Payer: BC Managed Care – PPO

## 2022-11-19 ENCOUNTER — Ambulatory Visit: Payer: BC Managed Care – PPO
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
## 2022-11-25 ENCOUNTER — Encounter: Payer: Self-pay | Admitting: Psychiatry

## 2022-11-25 ENCOUNTER — Telehealth (INDEPENDENT_AMBULATORY_CARE_PROVIDER_SITE_OTHER): Payer: BC Managed Care – PPO | Admitting: Psychiatry

## 2022-11-25 DIAGNOSIS — F3181 Bipolar II disorder: Secondary | ICD-10-CM | POA: Diagnosis not present

## 2022-11-25 DIAGNOSIS — F411 Generalized anxiety disorder: Secondary | ICD-10-CM

## 2022-11-25 DIAGNOSIS — G4700 Insomnia, unspecified: Secondary | ICD-10-CM | POA: Diagnosis not present

## 2022-11-25 DIAGNOSIS — F603 Borderline personality disorder: Secondary | ICD-10-CM

## 2022-11-25 MED ORDER — LAMOTRIGINE 200 MG PO TABS
200.0000 mg | ORAL_TABLET | Freq: Every day | ORAL | 4 refills | Status: DC
Start: 2022-11-25 — End: 2023-04-20

## 2022-11-25 MED ORDER — LORAZEPAM 0.5 MG PO TABS
0.5000 mg | ORAL_TABLET | Freq: Every day | ORAL | 1 refills | Status: AC | PRN
Start: 2022-12-04 — End: 2023-02-02

## 2023-01-03 ENCOUNTER — Ambulatory Visit (HOSPITAL_BASED_OUTPATIENT_CLINIC_OR_DEPARTMENT_OTHER): Payer: BC Managed Care – PPO | Admitting: Family

## 2023-01-03 NOTE — Progress Notes (Deleted)
Cardiology Office Note:  .   Date:  01/03/2023  ID:  Jasmine [name], DOB Oct 26, 1970, MRN [id number] PCP: Richmond Campbell., PA-C  [facility] Providers Cardiologist:  Jodelle Red, MD { Click to update primary MD,subspecialty MD or APP then REFRESH:1}   History of Present Illness: .   Jasmine [name] is a 52 y.o. female with hx of severe OSA, HFrEF, chronic respiratory failure with hypoxia, HTN, DM1, hypothyroidism, CKD V, prior tobacco use , RA.   Prior echo 04/2017 normal LVEF and diastolic function.  CPX 07/2017 noted ball for immediate desaturations with exercise to 87%, mild to moderate functional limitation, no cardiovascular limitation.  There was chronotropic incompetence with peak heart rate of 81 bpm.  Echo at [facility] 03/2020 normal LVEF,gr1dd, normal RV.   Last seen by Dr. Cristal Deer 08/06/22 noting to start peritoneal dialysis soon.  Stable intermittent chest pressure Miss, lying down at night.  Cardiac PET discussed but as symptoms are stable and atypical was deferred.  12/18/2022 ED visit migraine and hyperglycemia in setting of insulin pump malfunction.  Given 2 L IVF and insulin pump corrected.  7/17-7/20/24 with bilateral pneumonia and constipation. Readmitted 7/21-7/22/24 with increase in perihilar opacities and hypoxia treated with abx, bronchodilators, solu-medrol. Echo 12/22/22 LVEF 35-40%, gr1dd, mild MR, mild pHTN. She was then transferred to [facility] and discharged 12/28/20.   She presents today for follow up.  ***new HFrEF  ROS: Please see the history of present illness.    All other systems reviewed and are negative.   Studies Reviewed: .        Cardiac Studies & Procedures       ECHOCARDIOGRAM  ECHOCARDIOGRAM COMPLETE 09/11/2021  Narrative ECHOCARDIOGRAM REPORT    Patient Name:   Jasmine [name] Date of Exam: 09/11/2021 Medical Rec #:  [id number]         Height:       69.0 in Accession #:    [id number]        Weight:       325.6 lb Date of  Birth:  February 28, 1971         BSA:          2.542 m Patient Age:    50 years          BP:           150/57 mmHg Patient Gender: F                 HR:           73 bpm. Exam [facility]  Procedure: 2D Echo, Cardiac Doppler and Color Doppler  Indications:    Acute MI  History:        Patient has prior history of Echocardiogram examinations, most recent 04/11/2017. CHF; Risk Factors:Hypertension, Diabetes and Dyslipidemia.  Sonographer:    Mikki Harbor Referring Phys: [phone] DAVID TAT   Sonographer Comments: Patient is morbidly obese. Image acquisition challenging due to respiratory motion. IMPRESSIONS   1. Left ventricular ejection fraction, by estimation, is >75%. The left ventricle has hyperdynamic function. The left ventricle has no regional wall motion abnormalities. The left ventricular internal cavity size was mildly dilated. There is mild left ventricular hypertrophy. Left ventricular diastolic parameters were normal. 2. Right ventricular systolic function is normal. The right ventricular size is normal. There is mildly elevated pulmonary artery systolic pressure. 3. The mitral valve is normal in structure. Mild mitral valve regurgitation. 4. The aortic valve is tricuspid. Aortic  valve regurgitation is not visualized.  FINDINGS Left Ventricle: Left ventricular ejection fraction, by estimation, is >75%. The left ventricle has hyperdynamic function. The left ventricle has no regional wall motion abnormalities. The left ventricular internal cavity size was mildly dilated. There is mild left ventricular hypertrophy. Left ventricular diastolic parameters were normal.  Right Ventricle: The right ventricular size is normal. Right vetricular wall thickness was not assessed. Right ventricular systolic function is normal. There is mildly elevated pulmonary artery systolic pressure. The tricuspid regurgitant velocity is 2.71 m/s, and with an assumed right atrial pressure of 8 mmHg,  the estimated right ventricular systolic pressure is 37.4 mmHg.  Left Atrium: Left atrial size was normal in size.  Right Atrium: Right atrial size was normal in size.  Pericardium: There is no evidence of pericardial effusion.  Mitral Valve: The mitral valve is normal in structure. Mild mitral valve regurgitation. MV peak gradient, 7.0 mmHg. The mean mitral valve gradient is 4.0 mmHg.  Tricuspid Valve: The tricuspid valve is normal in structure. Tricuspid valve regurgitation is trivial.  Aortic Valve: The aortic valve is tricuspid. Aortic valve regurgitation is not visualized. Aortic valve mean gradient measures 8.7 mmHg. Aortic valve peak gradient measures 17.6 mmHg. Aortic valve area, by VTI measures 2.52 cm.  Pulmonic Valve: The pulmonic valve was normal in structure. Pulmonic valve regurgitation is mild.  Aorta: The aortic root and ascending aorta are structurally normal, with no evidence of dilitation.  IAS/Shunts: No atrial level shunt detected by color flow Doppler.   LEFT VENTRICLE PLAX 2D LVIDd:         5.50 cm     Diastology LVIDs:         2.50 cm     LV e' medial:    8.27 cm/s LV PW:         1.10 cm     LV E/e' medial:  12.5 LV IVS:        1.20 cm     LV e' lateral:   9.36 cm/s LVOT diam:     2.00 cm     LV E/e' lateral: 11.0 LV SV:         110 LV SV Index:   43 LVOT Area:     3.14 cm  LV Volumes (MOD) LV vol d, MOD A2C: 44.8 ml LV vol d, MOD A4C: 79.5 ml LV vol s, MOD A2C: 18.4 ml LV vol s, MOD A4C: 37.1 ml LV SV MOD A2C:     26.4 ml LV SV MOD A4C:     79.5 ml LV SV MOD BP:      33.2 ml  RIGHT VENTRICLE RV Basal diam:  3.65 cm RV Mid diam:    3.60 cm RV S prime:     13.60 cm/s TAPSE (M-mode): 2.0 cm  LEFT ATRIUM             Index        RIGHT ATRIUM           Index LA diam:        4.40 cm 1.73 cm/m   RA Area:     18.20 cm LA Vol (A2C):   62.9 ml 24.75 ml/m  RA Volume:   52.80 ml  20.77 ml/m LA Vol (A4C):   65.7 ml 25.85 ml/m LA Biplane Vol: 65.2  ml 25.65 ml/m AORTIC VALVE                     PULMONIC VALVE  AV Area (Vmax):    2.38 cm      PV Vmax:       1.04 m/s AV Area (Vmean):   2.53 cm      PV Peak grad:  4.3 mmHg AV Area (VTI):     2.52 cm AV Vmax:           210.00 cm/s AV Vmean:          134.000 cm/s AV VTI:            0.435 m AV Peak Grad:      17.6 mmHg AV Mean Grad:      8.7 mmHg LVOT Vmax:         159.00 cm/s LVOT Vmean:        108.000 cm/s LVOT VTI:          0.349 m LVOT/AV VTI ratio: 0.80  AORTA Ao Root diam: 2.70 cm Ao Asc diam:  3.10 cm  MITRAL VALVE                TRICUSPID VALVE MV Area (PHT): 2.22 cm     TR Peak grad:   29.4 mmHg MV Area VTI:   2.47 cm     TR Vmax:        271.00 cm/s MV Peak grad:  7.0 mmHg MV Mean grad:  4.0 mmHg     SHUNTS MV Vmax:       1.32 m/s     Systemic VTI:  0.35 m MV Vmean:      88.8 cm/s    Systemic Diam: 2.00 cm MV Decel Time: 341 msec MV E velocity: 103.00 cm/s MV A velocity: 108.00 cm/s MV E/A ratio:  0.95  Dietrich Pates MD Electronically signed by Dietrich Pates MD Signature Date/Time: 09/11/2021/4:01:20 PM    Final             Risk Assessment/Calculations:   {Does this patient have ATRIAL FIBRILLATION?[phone]} No BP recorded.  {Refresh Note OR Click here to enter BP  :1}***       Physical Exam:   VS:  LMP 03/23/2017 (Approximate)    Wt Readings from Last 3 Encounters:  08/06/22 278 lb 8 oz (126.3 kg)  04/09/22 279 lb (126.6 kg)  03/22/22 289 lb 3.2 oz (131.2 kg)    GEN: Well nourished, well developed in no acute distress NECK: No JVD; No carotid bruits CARDIAC: ***RRR, no murmurs, rubs, gallops RESPIRATORY:  Clear to auscultation without rales, wheezing or rhonchi  ABDOMEN: Soft, non-tender, non-distended EXTREMITIES:  No edema; No deformity   ASSESSMENT AND PLAN: .    Chest tightness-  HFrEF -   Chronic respiratory failure with hypoxia on O2 at night/severe OSA-  Chronotropic incompetence-pursue BX report.  Likely related to high-dose  beta-blocker.  Carvedilol dose previously decreased.  HTN-  Type 1 diabetes-  CKD V -     {Are you ordering a CV Procedure (e.g. stress test, cath, DCCV, TEE, etc)?   Press F2        :[id number]}  Dispo: ***  Signed, Alver Sorrow, NP
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
## 2023-01-19 ENCOUNTER — Telehealth: Payer: Medicare Other | Admitting: Psychiatry

## 2023-01-25 ENCOUNTER — Inpatient Hospital Stay: Payer: BC Managed Care – PPO | Admitting: Primary Care

## 2023-02-02 ENCOUNTER — Other Ambulatory Visit: Payer: Self-pay | Admitting: Psychiatry

## 2023-02-08 ENCOUNTER — Encounter (HOSPITAL_BASED_OUTPATIENT_CLINIC_OR_DEPARTMENT_OTHER): Payer: Self-pay

## 2023-02-18 ENCOUNTER — Other Ambulatory Visit: Payer: Self-pay | Admitting: Psychiatry

## 2023-02-21 ENCOUNTER — Other Ambulatory Visit: Payer: Self-pay | Admitting: Psychiatry

## 2023-02-21 DIAGNOSIS — F3181 Bipolar II disorder: Secondary | ICD-10-CM

## 2023-02-22 ENCOUNTER — Telehealth: Payer: Self-pay | Admitting: Cardiology

## 2023-02-22 MED ORDER — CLOPIDOGREL BISULFATE 75 MG PO TABS: 75.0000 mg | ORAL_TABLET | Freq: Every day | ORAL | 5 refills | Status: DC

## 2023-02-22 NOTE — Telephone Encounter (Signed)
Pt is requesting a callback regarding her being in the [facility] for a month back in July or August and they prescribed her Plavix while she was there but now need a refill and the hospital will no refill it so she'd like to speak with a nurse to be advised on what to do. Please advise

## 2023-02-22 NOTE — Telephone Encounter (Signed)
From Care Everywhere, ". Cardiology was consulted. NM stress test 7/31 showed no significant ischemia but did note a large, moderately severe, fixed perfusion deficit involving the apical segments felt to be consistent with possible LAD disease v stress cardiomyopathy. Given severe CKD as above, cardiology recommended medical management with outpatient follow-up in about 1 month (around 8/30); if wall motion abnormalities persist, will need further ischemic workup now that she will be on HD. She was started on clopidogrel and continued on home carvedilol and statin which we'll continue at discharge "  Will refill Plavix and make her aware.   Alver Sorrow, NP

## 2023-02-25 ENCOUNTER — Encounter: Payer: Self-pay | Admitting: Primary Care

## 2023-02-25 ENCOUNTER — Ambulatory Visit (INDEPENDENT_AMBULATORY_CARE_PROVIDER_SITE_OTHER): Payer: BC Managed Care – PPO | Admitting: Primary Care

## 2023-02-25 DIAGNOSIS — N184 Chronic kidney disease, stage 4 (severe): Secondary | ICD-10-CM | POA: Diagnosis not present

## 2023-02-25 DIAGNOSIS — J9611 Chronic respiratory failure with hypoxia: Secondary | ICD-10-CM | POA: Diagnosis not present

## 2023-02-25 DIAGNOSIS — G4733 Obstructive sleep apnea (adult) (pediatric): Secondary | ICD-10-CM

## 2023-02-25 MED ORDER — PROAIR HFA 108 (90 BASE) MCG/ACT IN AERS: 1.0000 | INHALATION_SPRAY | Freq: Four times a day (QID) | RESPIRATORY_TRACT | 1 refills | Status: DC | PRN

## 2023-02-25 NOTE — Patient Instructions (Signed)
No changes today Airview download showed excellent compliance, sleep apnea is well-controlled on current pressure settings Continue to wear CPAP with oxygen nightly 4-6 hours or longer  Use albuterol 2 puffs every 4-6 hours as needed for breakthrough shortness of breath or wheezing  Follow-up Keep appointment in November with Dr. Vassie Loll

## 2023-02-25 NOTE — Progress Notes (Signed)
@Patient  ID: Jasmine [name], female    DOB: 10/11/1970, 52 y.o.   MRN: [id number]  Chief Complaint  Patient presents with   Hospitalization Follow-up    Referring provider: Richmond Campbell., PA-C  HPI:  52  yo IDDM  for FU of OSA & asthma, mild pulmonary hypertension. She reports longstanding history of asthma for which she takes albuterol MDI as needed.  This is worse during the pollen season   PMH -IDDM on insulin pump, CKD stage III, multiple lung nodules stable from 20 18-20 20 RA -diagnosed 02/2020 ([facility])  Covid infection 05/2020  hospitalized 09/2021 for hypertension and hyperglycemia, CBG was 796.  ABG was 7.32/48/80 On discharge BUN/creatinine on 4/9 was 31/2.9 She required oxygen transiently.   Chief Complaint  Patient presents with   Follow-up    Cpap working well would like to try nasal pillows    4-month follow-up visit.  Breathing is okay. She arrives in a wheelchair her knees are  giving her trouble, she is following up with orthopedics. She uses albuterol as needed during change of season. She is compliant with CPAP machine and this certainly helps improve her daytime somnolence and fatigue. She could not tolerate a full facemask and compliance improved after switching to a nasal mask.  But this is now hurting nostrils and she would like to try nasal pillows.  Pressure is okay and she does not complain of significant dryness. We reviewed nocturnal oximetry and she is continuing on oxygen   02/25/2023 Patient presents today for hospital follow-up in July for heart failure. She is followed by our office for mild asthma and OSA. No issues with CPAP, sleeping ok at night. Breathing has been fine. No active asthma symptoms.  No cough, mucus production or fevers. No recent use of Albuterol.   Stress test showed no significant ischemia but did not large moderately severe fixed perfusion deficit involving the apical segments felt to be consitent with possible LAD  disease vs stress cardiomyopathy. Plan outpatient follow-up with cardiology (around 8/30); If wall motion abnormalities persist, will need further ischemic workup now that she will be on HD. She was started on clopidogrel and continued on home carvedilol and statin and advised to continue at discharge    Severe CKD, undergoes HD Tuesday, Thursday and Saturday.  Weight is trenging down.    Airview download 01/25/2023 - 02/23/2023 Usage days 29/30 days (97%) Average usage 8 hours 41 minutes Pressure 10 to 15 cm H2O (12.5 cm H2O-95%) Air leaks 28.6 L/min (95%) AHI 1.6   Significant tests/ events reviewed  10/2021 ONO on CPAP/RA >> desat >2h  Ambulation 10/05/19 OV >> dropped to 88%  04/2019 ABG 7.42/43/ 75     CT chest 04/2019 no emphysema, stable nodules   PFTs 04/2019-no airway obstruction, ratio 87, FEV1 79%, FVC 72%, TLC 82%, DLCO 76%   Echo 04/2017 normal LVEF, RVSP 38     CPAP titration >> 15 cm, Sm FF mask HST showed severe OSA - worse than before - avg 37 events/ hr with drop in O2 levels HST 07/2016 - moderate OSA with AHI 25/hour especially worse when supine with AHI 35/hour and lowest desaturation of 65%      Allergies  Allergen Reactions   Ciprofloxacin Swelling and Other (See Comments)    Per pt caused lips swell and nauseous feeling   Levaquin [Levofloxacin] Swelling and Other (See Comments)    Per pt caused lips swell and nauseous feeling   Buspar [Buspirone]  Other (See Comments)    abd cramping   Linaclotide Other (See Comments)   Advair Diskus [Fluticasone-Salmeterol] Other (See Comments)    Thrush    Biaxin [Clarithromycin] Rash   Hydroxyzine Palpitations    Immunization History  Administered Date(s) Administered   Influenza Split 08/07/2010, 04/07/2012, 04/07/2013, 03/03/2020   Influenza,inj,Quad PF,6+ Mos 03/03/2009, 04/07/2012, 04/07/2013, 03/10/2016, 03/02/2017, 02/23/2018, 03/01/2019, 03/03/2020, 04/10/2021, 04/02/2022   Influenza,inj,quad, With  Preservative 03/05/2019, 03/07/2020   Influenza-Unspecified 03/03/2009, 08/07/2010, 04/07/2012, 04/07/2013, 03/10/2016, 03/10/2017, 03/03/2020   Pneumococcal Polysaccharide-23 08/07/2010, 08/07/2010, 04/08/2011, 03/02/2017   Pneumococcal-Unspecified 04/08/2011, 03/10/2017   Tdap 04/21/2016    Past Medical History:  Diagnosis Date   Anxiety    Arthritis    Asthma    Balance problems    Bipolar disorder (HCC)    Charcot ankle    Chronic fatigue    Chronic kidney disease    STAGE 3-4   Depression    Diabetes mellitus    DKA, type 1 (HCC) 11/04/2011   Elevated cholesterol    Fibromyalgia    GERD (gastroesophageal reflux disease)    Headache    History of suicidal ideation    Hyperlipemia    Hypertension    Hypothyroidism    IBS (irritable bowel syndrome)    Memory changes    Obesity    Sleep apnea    HAS C -PAP / DOES NOT USE   Stress incontinence    Pt had surgery to correct this.   Tachycardia    Tobacco abuse    Tremor    UTI (lower urinary tract infection)     Tobacco History: Social History   Tobacco Use  Smoking Status Former   Current packs/day: 0.00   Average packs/day: 0.8 packs/day for 20.0 years (15.0 ttl pk-yrs)   Types: Cigarettes   Start date: 06/08/1991   Quit date: 06/08/2011   Years since quitting: 11.7  Smokeless Tobacco Never   Counseling given: Not Answered   Outpatient Medications Prior to Visit  Medication Sig Dispense Refill   aspirin EC 81 MG tablet Take 81 mg by mouth at bedtime.      [START ON 03/23/2023] buPROPion (WELLBUTRIN XL) 300 MG 24 hr tablet Take 1 tablet (300 mg total) by mouth daily. 30 tablet 5   carvedilol (COREG) 12.5 MG tablet Take 12.5 mg by mouth 2 (two) times daily with a meal.     Cholecalciferol 50 MCG (2000 UT) TABS Take 1 tablet by mouth daily.      clopidogrel (PLAVIX) 75 MG tablet Take 1 tablet (75 mg total) by mouth daily. 30 tablet 5   Continuous Blood Gluc Sensor MISC 1 each by Does not apply route as  directed. Use as directed every 14 days. May dispense FreeStyle [employer] or similar.     dicyclomine (BENTYL) 10 MG capsule Take 10 mg by mouth 3 (three) times daily as needed for spasms.     folic acid (FOLVITE) 400 MCG tablet Take 400 mcg by mouth every evening.      glucagon (GLUCAGEN HYPOKIT) 1 MG SOLR injection GlucaGen HypoKit 1 mg Injection     hydrALAZINE (APRESOLINE) 10 MG tablet Take 1 tablet (10 mg total) by mouth every 8 (eight) hours. 90 tablet 1   insulin detemir (LEVEMIR) 100 UNIT/ML FlexPen Inject 20 Units into the skin 2 (two) times daily. (Patient taking differently: Inject 20 Units into the skin as needed.) 15 mL 2   Insulin Pen Needle 31G X 5 MM MISC  Use with insulin pens to dispense insulin as directed 100 each 0   lamoTRIgine (LAMICTAL) 200 MG tablet Take 1 tablet (200 mg total) by mouth daily. 30 tablet 4   levothyroxine (SYNTHROID) 175 MCG tablet TAKE ONE TABLET BY MOUTH DAILY AT 6am     NOVOLOG 100 UNIT/ML injection Inject into the skin. Sliding Scale     Omega-3 Fatty Acids (FISH OIL PO) Take 1 capsule by mouth daily.     ondansetron (ZOFRAN ODT) 4 MG disintegrating tablet Take 1 tablet (4 mg total) by mouth every 8 (eight) hours as needed. 20 tablet 6   oxyCODONE-acetaminophen (PERCOCET/ROXICET) 5-325 MG tablet Take by mouth 4 (four) times daily as needed for severe pain.     OXYGEN Inhale 3 L into the lungs as needed.     rizatriptan (MAXALT) 10 MG tablet Take 1 tablet (10 mg total) by mouth as needed for migraine. May repeat in 2 hours if needed 12 tablet 11   Torsemide 40 MG TABS Take 40 mg by mouth daily.     DULoxetine (CYMBALTA) 60 MG capsule Take 1 capsule (60 mg total) by mouth daily. 30 capsule 3   omeprazole (PRILOSEC) 20 MG capsule Take 20 mg by mouth at bedtime.      amLODipine (NORVASC) 5 MG tablet Take 5 mg by mouth daily. (Patient not taking: Reported on 02/25/2023)     APPLE CIDER VINEGAR PO Take 1 tablet by mouth daily. (Patient not taking:  Reported on 02/25/2023)     carvedilol (COREG) 3.125 MG tablet Take 1 tablet by mouth 2 (two) times daily. (Patient not taking: Reported on 02/25/2023)     Coenzyme Q10 (COQ-10 PO) Take 1 capsule by mouth daily.     fluticasone (FLONASE) 50 MCG/ACT nasal spray Place 2 sprays into the nose daily as needed for allergies.      furosemide (LASIX) 20 MG tablet Take 20 mg by mouth daily.      gabapentin (NEURONTIN) 300 MG capsule Take 100 mg by mouth daily.     PROAIR HFA 108 (90 Base) MCG/ACT inhaler Inhale 1-2 puffs into the lungs every 6 (six) hours as needed for wheezing or shortness of breath.   1   ramelteon (ROZEREM) 8 MG tablet Take 1 tablet (8 mg total) by mouth at bedtime. 30 tablet 1   No facility-administered medications prior to visit.   Review of Systems  Review of Systems  Constitutional: Negative.   Respiratory: Negative.  Negative for cough and shortness of breath.    Physical Exam  BP 130/78 (BP Location: Left Arm, Patient Position: Sitting, Cuff Size: Large)   Pulse 74   Temp 98.1 F (36.7 C) (Oral)   Ht 5\' 9"  (1.753 m)   Wt 216 lb (98 kg)   LMP 03/23/2017 (Approximate)   SpO2 96%   BMI 31.90 kg/m  Physical Exam Constitutional:      General: She is not in acute distress.    Appearance: Normal appearance. She is not ill-appearing.  HENT:     Head: Normocephalic and atraumatic.  Cardiovascular:     Rate and Rhythm: Normal rate and regular rhythm.  Pulmonary:     Effort: Pulmonary effort is normal.     Breath sounds: Normal breath sounds. No wheezing or rhonchi.  Skin:    General: Skin is warm and dry.  Neurological:     General: No focal deficit present.     Mental Status: She is alert and oriented to person, place,  and time. Mental status is at baseline.  Psychiatric:        Mood and Affect: Mood normal.        Behavior: Behavior normal.        Thought Content: Thought content normal.        Judgment: Judgment normal.      Lab Results:  CBC     Component Value Date/Time   WBC 11.0 (H) 09/13/2021 2128   RBC 4.59 09/13/2021 2128   HGB 13.0 09/13/2021 2128   HCT 39.8 09/13/2021 2128   PLT 295 09/13/2021 2128   MCV 86.7 09/13/2021 2128   MCH 28.3 09/13/2021 2128   MCHC 32.7 09/13/2021 2128   RDW 14.2 09/13/2021 2128   LYMPHSABS 1.7 09/10/2021 1701   MONOABS 0.9 09/10/2021 1701   EOSABS 0.4 09/10/2021 1701   BASOSABS 0.1 09/10/2021 1701    BMET    Component Value Date/Time   NA 135 09/13/2021 2128   K 4.0 09/13/2021 2128   CL 107 09/13/2021 2128   CO2 22 09/13/2021 2128   GLUCOSE 131 (H) 09/13/2021 2128   BUN 31 (H) 09/13/2021 2128   CREATININE 2.93 (H) 09/13/2021 2128   CALCIUM 10.9 (H) 09/13/2021 2128   GFRNONAA 19 (L) 09/13/2021 2128   GFRAA 31 (L) 09/12/2019 0413    BNP No results found for: "BNP"  ProBNP    Component Value Date/Time   PROBNP 168.0 (H) 03/24/2020 1616    Imaging: No results found.   Assessment & Plan:   OSA (obstructive sleep apnea) - Airview download showed excellent compliance, sleep apnea is well-controlled on current pressure settings Continue to wear CPAP with oxygen nightly 4-6 hours or longer   Chronic respiratory failure with hypoxia (HCC) - Continue oxygen with CPAP at bedtime  - FU in November with Dr. Vassie Loll   Asthma - Stable; No acutely exacerbated - Use albuterol 2 puffs every 4-6 hours as needed for breakthrough shortness of breath or wheezing  CKD (chronic kidney disease), stage IV (HCC) - Continues HD Tues, Thursday, Saturday   Diastolic heart failure (HCC) - Stress test showed no significant ischemia but did not large moderately severe fixed perfusion deficit involving the apical segments felt to be consitent with possible LAD disease vs stress cardiomyopathy.  - Started on Plavix; Continues on carvedilol and statin  - Continue HD with nephrology  - Following with cardiology    Glenford Bayley, NP 03/07/2023

## 2023-03-04 ENCOUNTER — Other Ambulatory Visit (HOSPITAL_COMMUNITY): Payer: Self-pay

## 2023-03-07 DIAGNOSIS — J45909 Unspecified asthma, uncomplicated: Secondary | ICD-10-CM | POA: Insufficient documentation

## 2023-03-07 DIAGNOSIS — I2510 Atherosclerotic heart disease of native coronary artery without angina pectoris: Secondary | ICD-10-CM | POA: Insufficient documentation

## 2023-03-07 NOTE — Assessment & Plan Note (Signed)
-   Airview download showed excellent compliance, sleep apnea is well-controlled on current pressure settings Continue to wear CPAP with oxygen nightly 4-6 hours or longer

## 2023-03-07 NOTE — Assessment & Plan Note (Addendum)
-   Stress test showed no significant ischemia but did not large moderately severe fixed perfusion deficit involving the apical segments felt to be consitent with possible LAD disease vs stress cardiomyopathy.  - Started on Plavix; Continues on carvedilol and statin  - Continue HD with nephrology  - Following with cardiology

## 2023-03-07 NOTE — Assessment & Plan Note (Signed)
-   Continue oxygen with CPAP at bedtime  - FU in November with Dr. Vassie Loll

## 2023-03-07 NOTE — Assessment & Plan Note (Signed)
-   Continues HD Tues, Thursday, Saturday

## 2023-03-07 NOTE — Assessment & Plan Note (Signed)
-   Stable; No acutely exacerbated - Use albuterol 2 puffs every 4-6 hours as needed for breakthrough shortness of breath or wheezing

## 2023-03-08 ENCOUNTER — Other Ambulatory Visit: Payer: Self-pay | Admitting: *Deleted

## 2023-03-08 DIAGNOSIS — N184 Chronic kidney disease, stage 4 (severe): Secondary | ICD-10-CM

## 2023-03-22 ENCOUNTER — Encounter (HOSPITAL_COMMUNITY): Payer: BC Managed Care – PPO

## 2023-03-22 ENCOUNTER — Ambulatory Visit (HOSPITAL_BASED_OUTPATIENT_CLINIC_OR_DEPARTMENT_OTHER): Payer: BC Managed Care – PPO | Admitting: Cardiology

## 2023-03-22 ENCOUNTER — Other Ambulatory Visit (HOSPITAL_COMMUNITY): Payer: BC Managed Care – PPO

## 2023-03-22 ENCOUNTER — Encounter: Payer: BC Managed Care – PPO | Admitting: Vascular Surgery

## 2023-03-25 ENCOUNTER — Ambulatory Visit (INDEPENDENT_AMBULATORY_CARE_PROVIDER_SITE_OTHER): Payer: BC Managed Care – PPO | Admitting: Family

## 2023-03-25 ENCOUNTER — Encounter (HOSPITAL_BASED_OUTPATIENT_CLINIC_OR_DEPARTMENT_OTHER): Payer: Self-pay | Admitting: Family

## 2023-03-25 VITALS — BP 128/86 | HR 70 | Ht 69.0 in | Wt 261.0 lb

## 2023-03-25 DIAGNOSIS — I5020 Unspecified systolic (congestive) heart failure: Secondary | ICD-10-CM | POA: Diagnosis not present

## 2023-03-25 DIAGNOSIS — R9439 Abnormal result of other cardiovascular function study: Secondary | ICD-10-CM | POA: Diagnosis not present

## 2023-03-25 DIAGNOSIS — N185 Chronic kidney disease, stage 5: Secondary | ICD-10-CM

## 2023-03-25 DIAGNOSIS — I10 Essential (primary) hypertension: Secondary | ICD-10-CM | POA: Diagnosis not present

## 2023-03-25 NOTE — Patient Instructions (Signed)
Medication Instructions:  Your physician recommends that you continue on your current medications as directed. Please refer to the Current Medication list given to you today.   *If you need a refill on your cardiac medications before your next appointment, please call your pharmacy*   Testing/Procedures: How to Prepare for Your Cardiac PET/CT Stress Test:  1. Please do not take these medications before your test:   Medications that may interfere with the cardiac pharmacological stress agent (ex. nitrates - including erectile dysfunction medications, isosorbide mononitrate, tamulosin or beta-blockers) the day of the exam. (Erectile dysfunction medication should be held for at least 72 hrs prior to test) Theophylline containing medications for 12 hours. Dipyridamole 48 hours prior to the test. Your remaining medications may be taken with water.  2. Nothing to eat or drink, except water, 3 hours prior to arrival time.   NO caffeine/decaffeinated products, or chocolate 12 hours prior to arrival.  3. NO perfume, cologne or lotion on chest or abdomen area.          - FEMALES - Please avoid wearing dresses to this appointment.  4. Total time is 1 to 2 hours; you may want to bring reading material for the waiting time.  5. Please report to Radiology at the [facility] Main Entrance 30 minutes early for your test.  [address] [place], [place] [address]  6. Please report to Radiology at [facility] Main Entrance, medical mall, 30 mins prior to your test.  [address]  [place], [place]  [phone]  Diabetic Preparation:  Hold oral medications. You may take NPH and Lantus insulin. Do not take Humalog or Humulin R (Regular Insulin) the day of your test. Check blood sugars prior to leaving the house. If able to eat breakfast prior to 3 hour fasting, you may take all medications, including your insulin, Do not worry if you miss your breakfast  dose of insulin - start at your next meal. Patients who wear a continuous glucose monitor MUST remove the device prior to scanning.  IF YOU THINK YOU MAY BE PREGNANT, OR ARE NURSING PLEASE INFORM THE TECHNOLOGIST.  In preparation for your appointment, medication and supplies will be purchased.  Appointment availability is limited, so if you need to cancel or reschedule, please call the Radiology Department at [phone] [facility]) OR [phone] [facility])  24 hours in advance to avoid a cancellation fee of $100.00  What to Expect After you Arrive:  Once you arrive and check in for your appointment, you will be taken to a preparation room within the Radiology Department.  A technologist or Nurse will obtain your medical history, verify that you are correctly prepped for the exam, and explain the procedure.  Afterwards,  an IV will be started in your arm and electrodes will be placed on your skin for EKG monitoring during the stress portion of the exam. Then you will be escorted to the PET/CT scanner.  There, staff will get you positioned on the scanner and obtain a blood pressure and EKG.  During the exam, you will continue to be connected to the EKG and blood pressure machines.  A small, safe amount of a radioactive tracer will be injected in your IV to obtain a series of pictures of your heart along with an injection of a stress agent.    After your Exam:  It is recommended that you eat a meal and drink a caffeinated beverage to counter act any effects  of the stress agent.  Drink plenty of fluids for the remainder of the day and urinate frequently for the first couple of hours after the exam.  Your doctor will inform you of your test results within 7-10 business days.  For more information and frequently asked questions, please visit our website : [email or web address]  For questions about your test or how to prepare for your test, please call: Cardiac Imaging Nurse  [facility] Office: [phone]    Follow-Up: At [facility], you and your health needs are our priority.  As part of our continuing mission to provide you with exceptional heart care, we have created designated Provider Care Teams.  These Care Teams include your primary Cardiologist (physician) and Advanced Practice Providers (APPs -  Physician Assistants and Nurse Practitioners) who all work together to provide you with the care you need, when you need it.  We recommend signing up for the patient portal called "MyChart".  Sign up information is provided on this After Visit Summary.  MyChart is used to connect with patients for Virtual Visits (Telemedicine).  Patients are able to view lab/test results, encounter notes, upcoming appointments, etc.  Non-urgent messages can be sent to your provider as well.   To learn more about what you can do with MyChart, go to [email or web address].    Your next appointment:   Follow up in 3 months with Dr. Cristal Deer or Gillian Shields, NP

## 2023-03-25 NOTE — Progress Notes (Signed)
Cardiology Office Note:  .   Date:  03/25/2023  ID:  Jasmine [name], DOB 1970/10/27, MRN [id number] PCP: Jasmine [name]., PA-C  [facility] Providers Cardiologist:  Jasmine Red, MD    History of Present Illness: .   Jasmine [name] is a 52 y.o. 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  Prior CPX with report of chronotropic incompetence while on high-dose beta-blocker and carvedilol had been decreased.  Prior echo 03/2020 at [facility] normal LVEF, grade 1 diastolic dysfunction, normal RV.  She was last seen 08/06/2022.  She had stable chest tightness predominantly when laying that was overall atypical for angina.  Cardiac PET had been previously discussed but as symptoms are stable was deferred.  Admitted 7/22 - 01/17/2023 for acute hypoxemic respiratory failure and DKA requiring transfer to [facility] in the setting of CHF exacerbation and ultimately required new start dialysis.  Echo during admission 12/22/22 LVEF 35-40%,gr1dd, mild MR, mild pHTN. Repeat echo LVEF 45%, apical wall motion abnoralities and thinning and akinesis. NM stress 01/05/23 with no significant ischemia but did not large, moderately severe, fixed perfusion deficit involving apical segments (LAD disease vs stress cardiomyopathy). She was recommended for medical management and follow up in 1 month to consider outpatient ischemic evaluation. Plavix added, Coreg and statin continued.  Amlodipine stopped due to hypotension.   Presents today for follow-up with versus been.  Presently participating in hemodialysis Tuesday/Thursday/Saturday with chest port.  Has had episodes of low blood pressure and dizziness at hemodialysis and hydralazine is presently on hold.  This has been improved.  She is planning to transition to PD but  this has been [place]  Feeling overall well and gradually increasing physical activity.  Does often feel tired.  We discussed possible referral to outpatient physical therapy and she prefers to get through some upcoming appointments prior to starting.  ROS: Please see the history of present illness.    All other systems reviewed and are negative.   Studies Reviewed: Jasmine [name] Kitchen   EKG Interpretation Date/Time:  Friday March 25 2023 14:08:15 EDT Ventricular Rate:  70 PR Interval:  146 QRS Duration:  94 QT Interval:  416 QTC Calculation: 449 R Axis:   25  Text Interpretation: Normal sinus rhythm  No acute ST/T wave changes. Confirmed by Jasmine [name] ([id number]) on 03/25/2023 2:15:40 PM    Myoview 01/06/23 IMPRESSIONS:  __________________________________________________________________  - Abnormal myocardial perfusion study  - No significant ischemia noted  - There is a large, moderately severe, fixed perfusion defect involving  the apical, apical anterior, apical septal, apical lateral and mid  anterior segments. This is consistent with possible scar or in this  situation could be caused by stress cardiomyopathy Jasmine [name]).  - The LVEF is overall normal. There is marked akinesis of the apical  segments and hyperdynamic function of the basal segments. EF > 55%  - Minimal coronary calcifications were noted on the attenuation CT. Mitral  annular calcifications are noted  - Diffuse likely significant pulmonary edema noted. Small right sided  pleural effusion noted on attenuation CT  Echo 01/03/23   1. The left ventricle is normal in size with normal wall thickness.    2. The left ventricular systolic function is mildly decreased, LVEF is  visually estimated  at 45%.    3. There is apical and anteroseptal hypokinesis.    4. The right ventricle is normal in size, with probably normal systolic  function.  Echo  12/22/22  1. The left ventricle is mildly dilated in size with normal wall thickness.    2. The left ventricular systolic function is mildly to moderately decreased,  LVEF is visually estimated at 35-40% (akinetic apex).    3. There is grade I diastolic dysfunction (impaired relaxation).    4. There is mild mitral valve regurgitation.    5. The left atrium is moderately dilated in size.    6. The right ventricle is normal in size, with normal systolic function.    7. There is mild pulmonary hypertension.    8. IVC size and inspiratory change suggest normal right atrial pressure.  (0-5 mmHg).  Risk Assessment/Calculations:             Physical Exam:   VS:  BP 128/86   Pulse 70   Ht 5\' 9"  (1.753 m)   Wt 261 lb (118.4 kg)   LMP 03/23/2017 (Approximate)   SpO2 96%   BMI 38.54 kg/m    Wt Readings from Last 3 Encounters:  03/25/23 261 lb (118.4 kg)  02/25/23 216 lb (98 kg)  08/06/22 278 lb 8 oz (126.3 kg)    GEN: Well nourished, well developed in no acute distress NECK: No JVD; No carotid bruits CARDIAC: RRR, no murmurs, rubs, gallops RESPIRATORY:  Clear to auscultation without rales, wheezing or rhonchi  ABDOMEN: Soft, non-tender, non-distended EXTREMITIES:  No edema; No deformity   ASSESSMENT AND PLAN: .    HFrEF - Euvolemic and well compensated on exam.  Continue torsemide per nephrology, still making urine.  EF noted to be reduced in the setting of acute hypoxemic respiratory failure.  Will update cardiac PET to reassess LVEF.  If LVEF still reduced consider discussion regarding possible addition of Jardiance with her nephrologist.  GDMT includes carvedilol, torsemide.  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  Abnormal myoview - NM stress 01/05/23 with no significant ischemia but did not large, moderately severe, fixed perfusion deficit involving apical segments (LAD disease vs stress cardiomyopathy). She was recommended for medical  management and follow up in 1 month to consider outpatient ischemic evaluation. Plan for cardiac PET to assess for CAD.  HTN -hydralazine presently on hold due to hypotension at hemodialysis.  Continue carvedilol 12.5 mg twice daily, torsemide 40 mg daily (distal [place]).  She will contact if she has persistent hypotension and could consider reducing dose of carvedilol.    Informed Consent   Shared Decision Making/Informed Consent The risks [chest pain, shortness of breath, cardiac arrhythmias, dizziness, blood pressure fluctuations, myocardial infarction, stroke/transient ischemic attack, nausea, vomiting, allergic reaction, radiation exposure, metallic taste sensation and life-threatening complications (estimated to be 1 in 10,000)], benefits (risk stratification, diagnosing coronary artery disease, treatment guidance) and alternatives of a cardiac PET stress test were discussed in detail with Jasmine [name] and she agrees to proceed.     Dispo: follow up in 3 mos  Signed, Alver Sorrow, NP

## 2023-03-28 ENCOUNTER — Inpatient Hospital Stay (HOSPITAL_COMMUNITY)
Admission: EM | Admit: 2023-03-28 | Discharge: 2023-03-31 | DRG: 919 | Disposition: A | Discharge status: Home / Self Care | Payer: BC Managed Care – PPO | Attending: Family Medicine | Admitting: Family Medicine

## 2023-03-28 ENCOUNTER — Other Ambulatory Visit: Payer: Self-pay

## 2023-03-28 ENCOUNTER — Emergency Department (HOSPITAL_COMMUNITY): Payer: BC Managed Care – PPO

## 2023-03-28 DIAGNOSIS — Z881 Allergy status to other antibiotic agents status: Secondary | ICD-10-CM

## 2023-03-28 DIAGNOSIS — I2510 Atherosclerotic heart disease of native coronary artery without angina pectoris: Secondary | ICD-10-CM | POA: Diagnosis present

## 2023-03-28 DIAGNOSIS — E1010 Type 1 diabetes mellitus with ketoacidosis without coma: Secondary | ICD-10-CM | POA: Diagnosis not present

## 2023-03-28 DIAGNOSIS — N2581 Secondary hyperparathyroidism of renal origin: Secondary | ICD-10-CM | POA: Diagnosis present

## 2023-03-28 DIAGNOSIS — I132 Hypertensive heart and chronic kidney disease with heart failure and with stage 5 chronic kidney disease, or end stage renal disease: Secondary | ICD-10-CM | POA: Diagnosis present

## 2023-03-28 DIAGNOSIS — Z807 Family history of other malignant neoplasms of lymphoid, hematopoietic and related tissues: Secondary | ICD-10-CM

## 2023-03-28 DIAGNOSIS — Z8249 Family history of ischemic heart disease and other diseases of the circulatory system: Secondary | ICD-10-CM

## 2023-03-28 DIAGNOSIS — R5382 Chronic fatigue, unspecified: Secondary | ICD-10-CM | POA: Diagnosis present

## 2023-03-28 DIAGNOSIS — N186 End stage renal disease: Secondary | ICD-10-CM | POA: Diagnosis present

## 2023-03-28 DIAGNOSIS — Z83438 Family history of other disorder of lipoprotein metabolism and other lipidemia: Secondary | ICD-10-CM

## 2023-03-28 DIAGNOSIS — N390 Urinary tract infection, site not specified: Secondary | ICD-10-CM | POA: Diagnosis present

## 2023-03-28 DIAGNOSIS — J45909 Unspecified asthma, uncomplicated: Secondary | ICD-10-CM | POA: Diagnosis present

## 2023-03-28 DIAGNOSIS — R7989 Other specified abnormal findings of blood chemistry: Secondary | ICD-10-CM

## 2023-03-28 DIAGNOSIS — E441 Mild protein-calorie malnutrition: Secondary | ICD-10-CM | POA: Diagnosis present

## 2023-03-28 DIAGNOSIS — E039 Hypothyroidism, unspecified: Secondary | ICD-10-CM | POA: Diagnosis present

## 2023-03-28 DIAGNOSIS — N250 Renal osteodystrophy: Secondary | ICD-10-CM | POA: Diagnosis present

## 2023-03-28 DIAGNOSIS — I10 Essential (primary) hypertension: Secondary | ICD-10-CM | POA: Diagnosis not present

## 2023-03-28 DIAGNOSIS — Z87891 Personal history of nicotine dependence: Secondary | ICD-10-CM

## 2023-03-28 DIAGNOSIS — M069 Rheumatoid arthritis, unspecified: Secondary | ICD-10-CM | POA: Diagnosis present

## 2023-03-28 DIAGNOSIS — Y742 Prosthetic and other implants, materials and accessory general hospital and personal-use devices associated with adverse incidents: Secondary | ICD-10-CM | POA: Diagnosis present

## 2023-03-28 DIAGNOSIS — E1110 Type 2 diabetes mellitus with ketoacidosis without coma: Secondary | ICD-10-CM | POA: Diagnosis present

## 2023-03-28 DIAGNOSIS — Z79890 Hormone replacement therapy (postmenopausal): Secondary | ICD-10-CM

## 2023-03-28 DIAGNOSIS — T85614A Breakdown (mechanical) of insulin pump, initial encounter: Principal | ICD-10-CM | POA: Diagnosis present

## 2023-03-28 DIAGNOSIS — G9341 Metabolic encephalopathy: Secondary | ICD-10-CM | POA: Diagnosis present

## 2023-03-28 DIAGNOSIS — E8809 Other disorders of plasma-protein metabolism, not elsewhere classified: Secondary | ICD-10-CM | POA: Diagnosis present

## 2023-03-28 DIAGNOSIS — Z79899 Other long term (current) drug therapy: Secondary | ICD-10-CM

## 2023-03-28 DIAGNOSIS — Z8052 Family history of malignant neoplasm of bladder: Secondary | ICD-10-CM

## 2023-03-28 DIAGNOSIS — E1022 Type 1 diabetes mellitus with diabetic chronic kidney disease: Secondary | ICD-10-CM | POA: Diagnosis present

## 2023-03-28 DIAGNOSIS — I5042 Chronic combined systolic (congestive) and diastolic (congestive) heart failure: Secondary | ICD-10-CM | POA: Diagnosis present

## 2023-03-28 DIAGNOSIS — G4733 Obstructive sleep apnea (adult) (pediatric): Secondary | ICD-10-CM | POA: Diagnosis present

## 2023-03-28 DIAGNOSIS — D72823 Leukemoid reaction: Secondary | ICD-10-CM | POA: Diagnosis present

## 2023-03-28 DIAGNOSIS — Z91128 Patient's intentional underdosing of medication regimen for other reason: Secondary | ICD-10-CM

## 2023-03-28 DIAGNOSIS — Z825 Family history of asthma and other chronic lower respiratory diseases: Secondary | ICD-10-CM

## 2023-03-28 DIAGNOSIS — K219 Gastro-esophageal reflux disease without esophagitis: Secondary | ICD-10-CM | POA: Diagnosis present

## 2023-03-28 DIAGNOSIS — Z833 Family history of diabetes mellitus: Secondary | ICD-10-CM

## 2023-03-28 DIAGNOSIS — E0810 Diabetes mellitus due to underlying condition with ketoacidosis without coma: Secondary | ICD-10-CM | POA: Diagnosis not present

## 2023-03-28 DIAGNOSIS — I5022 Chronic systolic (congestive) heart failure: Secondary | ICD-10-CM

## 2023-03-28 DIAGNOSIS — D631 Anemia in chronic kidney disease: Secondary | ICD-10-CM | POA: Diagnosis present

## 2023-03-28 DIAGNOSIS — F3181 Bipolar II disorder: Secondary | ICD-10-CM | POA: Diagnosis present

## 2023-03-28 DIAGNOSIS — Z8349 Family history of other endocrine, nutritional and metabolic diseases: Secondary | ICD-10-CM

## 2023-03-28 DIAGNOSIS — E782 Mixed hyperlipidemia: Secondary | ICD-10-CM | POA: Diagnosis present

## 2023-03-28 DIAGNOSIS — Z992 Dependence on renal dialysis: Secondary | ICD-10-CM

## 2023-03-28 DIAGNOSIS — J9610 Chronic respiratory failure, unspecified whether with hypoxia or hypercapnia: Secondary | ICD-10-CM | POA: Diagnosis present

## 2023-03-28 DIAGNOSIS — E46 Unspecified protein-calorie malnutrition: Secondary | ICD-10-CM

## 2023-03-28 DIAGNOSIS — I953 Hypotension of hemodialysis: Secondary | ICD-10-CM | POA: Diagnosis not present

## 2023-03-28 DIAGNOSIS — I161 Hypertensive emergency: Secondary | ICD-10-CM | POA: Diagnosis not present

## 2023-03-28 DIAGNOSIS — G43709 Chronic migraine without aura, not intractable, without status migrainosus: Secondary | ICD-10-CM | POA: Diagnosis present

## 2023-03-28 DIAGNOSIS — E66812 Obesity, class 2: Secondary | ICD-10-CM | POA: Diagnosis present

## 2023-03-28 DIAGNOSIS — M797 Fibromyalgia: Secondary | ICD-10-CM | POA: Diagnosis present

## 2023-03-28 DIAGNOSIS — Z7902 Long term (current) use of antithrombotics/antiplatelets: Secondary | ICD-10-CM

## 2023-03-28 DIAGNOSIS — Z818 Family history of other mental and behavioral disorders: Secondary | ICD-10-CM

## 2023-03-28 DIAGNOSIS — E871 Hypo-osmolality and hyponatremia: Secondary | ICD-10-CM | POA: Diagnosis present

## 2023-03-28 DIAGNOSIS — E869 Volume depletion, unspecified: Secondary | ICD-10-CM | POA: Diagnosis present

## 2023-03-28 DIAGNOSIS — I340 Nonrheumatic mitral (valve) insufficiency: Secondary | ICD-10-CM | POA: Diagnosis present

## 2023-03-28 DIAGNOSIS — I272 Pulmonary hypertension, unspecified: Secondary | ICD-10-CM | POA: Diagnosis present

## 2023-03-28 DIAGNOSIS — Z6839 Body mass index (BMI) 39.0-39.9, adult: Secondary | ICD-10-CM

## 2023-03-28 DIAGNOSIS — Z888 Allergy status to other drugs, medicaments and biological substances status: Secondary | ICD-10-CM

## 2023-03-28 LAB — I-STAT CHEM 8, ED
BUN: 34 mg/dL — ABNORMAL HIGH (ref 6–20)
Calcium, Ion: 1.31 mmol/L (ref 1.15–1.40)
Chloride: 93 mmol/L — ABNORMAL LOW (ref 98–111)
Creatinine, Ser: 3.5 mg/dL — ABNORMAL HIGH (ref 0.44–1.00)
Glucose, Bld: 598 mg/dL (ref 70–99)
HCT: 31 % — ABNORMAL LOW (ref 36.0–46.0)
Hemoglobin: 10.5 g/dL — ABNORMAL LOW (ref 12.0–15.0)
Potassium: 4.7 mmol/L (ref 3.5–5.1)
Sodium: 129 mmol/L — ABNORMAL LOW (ref 135–145)
TCO2: 19 mmol/L — ABNORMAL LOW (ref 22–32)

## 2023-03-28 LAB — CBC WITH DIFFERENTIAL/PLATELET
Abs Immature Granulocytes: 0.03 10*3/uL (ref 0.00–0.07)
Basophils Absolute: 0.1 10*3/uL (ref 0.0–0.1)
Basophils Relative: 1 %
Eosinophils Absolute: 0 10*3/uL (ref 0.0–0.5)
Eosinophils Relative: 0 %
HCT: 36.7 % (ref 36.0–46.0)
Hemoglobin: 11.5 g/dL — ABNORMAL LOW (ref 12.0–15.0)
Immature Granulocytes: 0 %
Lymphocytes Relative: 11 %
Lymphs Abs: 0.9 10*3/uL (ref 0.7–4.0)
MCH: 30 pg (ref 26.0–34.0)
MCHC: 31.3 g/dL (ref 30.0–36.0)
MCV: 95.8 fL (ref 80.0–100.0)
Monocytes Absolute: 0.4 10*3/uL (ref 0.1–1.0)
Monocytes Relative: 5 %
Neutro Abs: 6.8 10*3/uL (ref 1.7–7.7)
Neutrophils Relative %: 83 %
Platelets: 205 10*3/uL (ref 150–400)
RBC: 3.83 MIL/uL — ABNORMAL LOW (ref 3.87–5.11)
RDW: 13 % (ref 11.5–15.5)
WBC: 8.3 10*3/uL (ref 4.0–10.5)
nRBC: 0 % (ref 0.0–0.2)

## 2023-03-28 LAB — CBG MONITORING, ED
Glucose-Capillary: 193 mg/dL — ABNORMAL HIGH (ref 70–99)
Glucose-Capillary: 225 mg/dL — ABNORMAL HIGH (ref 70–99)
Glucose-Capillary: 258 mg/dL — ABNORMAL HIGH (ref 70–99)
Glucose-Capillary: 360 mg/dL — ABNORMAL HIGH (ref 70–99)
Glucose-Capillary: 390 mg/dL — ABNORMAL HIGH (ref 70–99)
Glucose-Capillary: 413 mg/dL — ABNORMAL HIGH (ref 70–99)
Glucose-Capillary: 433 mg/dL — ABNORMAL HIGH (ref 70–99)
Glucose-Capillary: 566 mg/dL (ref 70–99)
Glucose-Capillary: >600 mg/dL (ref 70–99)

## 2023-03-28 LAB — COMPREHENSIVE METABOLIC PANEL
ALT: 14 U/L (ref 0–44)
AST: 20 U/L (ref 15–41)
Albumin: 3.3 g/dL — ABNORMAL LOW (ref 3.5–5.0)
Alkaline Phosphatase: 104 U/L (ref 38–126)
Anion gap: 13 (ref 5–15)
BUN: 37 mg/dL — ABNORMAL HIGH (ref 6–20)
CO2: 22 mmol/L (ref 22–32)
Calcium: 9.7 mg/dL (ref 8.9–10.3)
Chloride: 95 mmol/L — ABNORMAL LOW (ref 98–111)
Creatinine, Ser: 3.21 mg/dL — ABNORMAL HIGH (ref 0.44–1.00)
GFR, Estimated: 17 mL/min — ABNORMAL LOW (ref >60)
Glucose, Bld: 490 mg/dL — ABNORMAL HIGH (ref 70–99)
Potassium: 4.8 mmol/L (ref 3.5–5.1)
Sodium: 130 mmol/L — ABNORMAL LOW (ref 135–145)
Total Bilirubin: 1.1 mg/dL (ref 0.3–1.2)
Total Protein: 6.6 g/dL (ref 6.5–8.1)

## 2023-03-28 LAB — BLOOD GAS, VENOUS
Acid-base deficit: 3.5 mmol/L — ABNORMAL HIGH (ref 0.0–2.0)
Bicarbonate: 22.7 mmol/L (ref 20.0–28.0)
Drawn by: 4237
O2 Saturation: 77.7 %
Patient temperature: 36.6
pCO2, Ven: 43 mm[Hg] — ABNORMAL LOW (ref 44–60)
pH, Ven: 7.33 (ref 7.25–7.43)
pO2, Ven: 45 mm[Hg] (ref 32–45)

## 2023-03-28 LAB — BETA-HYDROXYBUTYRIC ACID: Beta-Hydroxybutyric Acid: 4.07 mmol/L — ABNORMAL HIGH (ref 0.05–0.27)

## 2023-03-28 MED ORDER — ALUM & MAG HYDROXIDE-SIMETH 200-200-20 MG/5ML PO SUSP
30.0000 mL | Freq: Once | ORAL | Status: AC
  Administered 2023-03-28: 30 mL via ORAL
  Filled 2023-03-28: qty 30

## 2023-03-28 MED ORDER — POTASSIUM CHLORIDE 10 MEQ/100ML IV SOLN
10.0000 meq | INTRAVENOUS | Status: AC
  Administered 2023-03-28 (×2): 10 meq via INTRAVENOUS
  Filled 2023-03-28 (×2): qty 100

## 2023-03-28 MED ORDER — DEXTROSE IN LACTATED RINGERS 5 % IV SOLN: INTRAVENOUS | Status: AC

## 2023-03-28 MED ORDER — ONDANSETRON HCL 4 MG/2ML IJ SOLN
4.0000 mg | Freq: Once | INTRAMUSCULAR | Status: AC
  Administered 2023-03-28: 4 mg via INTRAVENOUS
  Filled 2023-03-28: qty 2

## 2023-03-28 MED ORDER — LACTATED RINGERS IV BOLUS
20.0000 mL/kg | Freq: Once | INTRAVENOUS | Status: AC
Start: 2023-03-28 — End: 2023-03-28
  Administered 2023-03-28: 2368 mL via INTRAVENOUS

## 2023-03-28 MED ORDER — DEXTROSE 50 % IV SOLN
0.0000 mL | INTRAVENOUS | Status: DC | PRN
Start: 2023-03-28 — End: 2023-03-29

## 2023-03-28 MED ORDER — INSULIN REGULAR(HUMAN) IN NACL 100-0.9 UT/100ML-% IV SOLN
INTRAVENOUS | Status: DC
  Administered 2023-03-28: 8 [IU]/h via INTRAVENOUS
  Filled 2023-03-28: qty 100

## 2023-03-28 NOTE — ED Triage Notes (Signed)
Pt arrived via [facility] c/o cbg reading greater than 600 and reading high all night. Hx of T1D and CHF, also a dialysis pt T, TH, Saturday. C/o nausea, 4 mg of zofran given by EMS as well as NaCl bolus. Pt states she has been giving herself novolog to try and bring it down but it hasn't come down

## 2023-03-28 NOTE — ED Notes (Signed)
ED Provider at bedside. 

## 2023-03-28 NOTE — ED Notes (Signed)
MD made aware of cbg over 600

## 2023-03-28 NOTE — H&P (Signed)
History and Physical    Patient: Jasmine [name] UVO:[id number] DOB: 12-24-1970 DOA: 03/28/2023 DOS: the patient was seen and examined on 03/29/2023 PCP: Richmond Campbell., PA-C  Patient coming from: Home  Chief Complaint:  Chief Complaint  Patient presents with   Hyperglycemia   HPI: Jasmine [name] is a 52 y.o. female with medical history significant of type 1 diabetes mellitus, OSA on CPAP, CKD stage IV, hypertension, HFrEF, mild pHTN presents to the emergency department who presents to the emergency department via EMS due to feeling sick since last night, she complained of nausea and weakness, she states that her Dexcom was not working right due to low battery and apparently has not been getting enough insulin through insulin pump.  On checking her blood glucose level, it was around 600.  EMS was activated and on arrival of EMS team IV Zofran 4 mg x 1 was given, NS 500 mL was also given.  She states that she was given Nancyi of NovoLog at home, but the blood glucose level did not come down.  She was sent to the ED for further evaluation and management.  ED Course:  In the emergency department, BP was 128/57, O2 sat was 98% on supplemental oxygen via [place] at 3 LPM, other vital signs were within normal range.  Workup in the ED showed normocytic anemia, BMP showed hyponatremia with sodium of 130, chloride 95, blood glucose 190, BUN/creatinine 37/3.21, albumin 3.3, EGFR 17, beta-hydroxybutyrate acid 4.07 Chest x-ray showed vascular congestion likely related to volume overload. Patient was started on insulin drip per Endo tool.  Hospitalist was asked to admit patient for further evaluation and management.  Review of Systems: Review of systems as noted in the HPI. All other systems reviewed and are negative.   Past Medical History:  Diagnosis Date   Anxiety    Arthritis    Asthma    Balance problems    Bipolar disorder (HCC)    Charcot ankle    Chronic fatigue    Chronic kidney  disease    STAGE 3-4   Depression    Diabetes mellitus    DKA, type 1 (HCC) 11/04/2011   Elevated cholesterol    Fibromyalgia    GERD (gastroesophageal reflux disease)    Headache    History of suicidal ideation    Hyperlipemia    Hypertension    Hypothyroidism    IBS (irritable bowel syndrome)    Memory changes    Obesity    Sleep apnea    HAS C -PAP / DOES NOT USE   Stress incontinence    Pt had surgery to correct this.   Tachycardia    Tobacco abuse    Tremor    UTI (lower urinary tract infection)    Past Surgical History:  Procedure Laterality Date   INCONTINENCE SURGERY     NASAL FRACTURE SURGERY     ovary removed     OVARY SURGERY     PUBOVAGINAL SLING  08/16/2011   Procedure: Leonides Grills;  Surgeon: Valetta Fuller, MD;  Location: [facility];  Service: Urology;  Laterality: N/A;          UTERINE FIBROID SURGERY  2001    Social History:  reports that she quit smoking about 11 years ago. Her smoking use included cigarettes. She started smoking about 31 years ago. She has a 15 pack-year smoking history. She has never used smokeless tobacco. She reports that she does not drink alcohol and does  not use drugs.   Allergies  Allergen Reactions   Ciprofloxacin Swelling and Other (See Comments)    Per pt caused lips swell and nauseous feeling   Levaquin [Levofloxacin] Swelling and Other (See Comments)    Per pt caused lips swell and nauseous feeling   Buspar [Buspirone] Other (See Comments)    abd cramping   Linaclotide Other (See Comments)   Advair Diskus [Fluticasone-Salmeterol] Other (See Comments)    Thrush    Biaxin [Clarithromycin] Rash   Hydroxyzine Palpitations    Family History  Problem Relation Age of Onset   Asthma Mother    Bipolar disorder Mother    Heart disease Father    Lymphoma Father    Hypertension Father    Thyroid disease Father    Hyperlipidemia Father    Diabetes Father    Cancer Paternal Grandmother        lung and breast    Bladder Cancer Paternal Grandfather    Suicidality Maternal Grandfather    Thyroid disease Brother      Prior to Admission medications   Medication Sig Start Date End Date Taking? Authorizing Provider  aspirin EC 81 MG tablet Take 81 mg by mouth at bedtime.     [provider]  buPROPion (WELLBUTRIN XL) 300 MG 24 hr tablet Take 1 tablet (300 mg total) by mouth daily. 03/23/23 09/19/23  Neysa Hotter, MD  carvedilol (COREG) 12.5 MG tablet Take 12.5 mg by mouth 2 (two) times daily with a meal. 01/17/23 01/17/24  [provider]  Cholecalciferol 50 MCG (2000 UT) TABS Take 1 tablet by mouth daily.  07/26/18   [provider]  clopidogrel (PLAVIX) 75 MG tablet Take 1 tablet (75 mg total) by mouth daily. 02/22/23   Alver Sorrow, NP  Continuous Blood Gluc Sensor MISC 1 each by Does not apply route as directed. Use as directed every 14 days. May dispense FreeStyle [employer] or similar.    [provider]  dicyclomine (BENTYL) 10 MG capsule Take 10 mg by mouth 3 (three) times daily as needed for spasms.    [provider]  DULoxetine (CYMBALTA) 60 MG capsule Take 1 capsule (60 mg total) by mouth daily. 10/06/22 02/03/23  Neysa Hotter, MD  folic acid (FOLVITE) 400 MCG tablet Take 400 mcg by mouth every evening.     [provider]  glucagon (GLUCAGEN HYPOKIT) 1 MG SOLR injection GlucaGen HypoKit 1 mg Injection 03/13/20   [provider]  hydrALAZINE (APRESOLINE) 10 MG tablet Take 1 tablet (10 mg total) by mouth every 8 (eight) hours. Patient not taking: Reported on 03/25/2023 09/12/21   Catarina Hartshorn, MD  insulin detemir (LEVEMIR) 100 UNIT/ML FlexPen Inject 20 Units into the skin 2 (two) times daily. Patient taking differently: Inject 20 Units into the skin as needed. 09/12/21   Catarina Hartshorn, MD  Insulin Pen Needle 31G X 5 MM MISC Use with insulin pens to dispense insulin as directed 09/12/21   Tat, Onalee Hua, MD  lamoTRIgine (LAMICTAL) 200 MG tablet  Take 1 tablet (200 mg total) by mouth daily. 11/25/22 04/24/23  Neysa Hotter, MD  levothyroxine (SYNTHROID) 175 MCG tablet TAKE ONE TABLET BY MOUTH DAILY AT 6am 11/20/21   [provider]  NOVOLOG 100 UNIT/ML injection Inject into the skin. Sliding Scale 03/15/22   [provider]  Omega-3 Fatty Acids (FISH OIL PO) Take 1 capsule by mouth daily.    [provider]  ondansetron (ZOFRAN ODT) 4 MG disintegrating  tablet Take 1 tablet (4 mg total) by mouth every 8 (eight) hours as needed. 03/23/21   Levert Feinstein, MD  oxyCODONE-acetaminophen (PERCOCET/ROXICET) 5-325 MG tablet Take by mouth 4 (four) times daily as needed for severe pain.    [provider]  OXYGEN Inhale 3 L into the lungs as needed.    [provider]  PROAIR HFA 108 [phone] Base) MCG/ACT inhaler Inhale 1-2 puffs into the lungs every 6 (six) hours as needed for wheezing or shortness of breath. 02/25/23   Glenford Bayley, NP  rizatriptan (MAXALT) 10 MG tablet Take 1 tablet (10 mg total) by mouth as needed for migraine. May repeat in 2 hours if needed 04/21/22   Levert Feinstein, MD  Torsemide 40 MG TABS Take 40 mg by mouth daily. 01/18/23   [provider]    Physical Exam: BP (!) 247/110   Pulse 78   Temp 98.3 F (36.8 C) (Oral)   Resp (!) 28   Ht 5\' 9"  (1.753 m)   Wt 119.8 kg   LMP 03/23/2017 (Approximate)   SpO2 100%   BMI 39.00 kg/m   General: 52 y.o. year-old female well developed well nourished, on CPAP and in no acute distress.  Alert and oriented x3. HEENT: NCAT, EOMI Neck: Supple, trachea medial Cardiovascular: Regular rate and rhythm with no rubs or gallops.  No thyromegaly or JVD noted.  No lower extremity edema. 2/4 pulses in all 4 extremities. Respiratory: Clear to auscultation with no wheezes or rales. Good inspiratory effort. Abdomen: Soft, nontender nondistended with normal bowel sounds x4 quadrants. Muskuloskeletal: No cyanosis, clubbing or edema noted  bilaterally Neuro: CN II-XII intact, strength 5/5 x 4, sensation, reflexes intact Skin: No ulcerative lesions noted or rashes Psychiatry: Judgement and insight appear normal. Mood is appropriate for condition and setting          Labs on Admission:  Basic Metabolic Panel: Recent Labs  Lab 03/28/23 1704 03/28/23 1825 03/29/23 0319  NA 129* 130* 129*  K 4.7 4.8 4.7  CL 93* 95* 96*  CO2  --  22 23  GLUCOSE 598* 490* 210*  BUN 34* 37* 37*  CREATININE 3.50* 3.21* 2.85*  CALCIUM  --  9.7 9.7   Liver Function Tests: Recent Labs  Lab 03/28/23 1825  AST 20  ALT 14  ALKPHOS 104  BILITOT 1.1  PROT 6.6  ALBUMIN 3.3*   No results for input(s): "LIPASE", "AMYLASE" in the last 168 hours. No results for input(s): "AMMONIA" in the last 168 hours. CBC: Recent Labs  Lab 03/28/23 1704 03/28/23 1825  WBC  --  8.3  NEUTROABS  --  6.8  HGB 10.5* 11.5*  HCT 31.0* 36.7  MCV  --  95.8  PLT  --  205   Cardiac Enzymes: No results for input(s): "CKTOTAL", "CKMB", "CKMBINDEX", "TROPONINI" in the last 168 hours.  BNP (last 3 results) No results for input(s): "BNP" in the last 8760 hours.  ProBNP (last 3 results) No results for input(s): "PROBNP" in the last 8760 hours.  CBG: Recent Labs  Lab 03/28/23 2118 03/28/23 2236 03/28/23 2348 03/29/23 0155 03/29/23 0305  GLUCAP 258* 225* 193* 217* 212*    Radiological Exams on Admission: DG Chest Portable 1 View  Result Date: 03/28/2023 CLINICAL DATA:  Shortness of breath EXAM: PORTABLE CHEST 1 VIEW COMPARISON:  12/27/2022 FINDINGS: Cardiac shadow is stable. Vascular congestion is noted likely related to volume overload. Dialysis catheter is noted in place. No sizable effusion is seen.  No focal infiltrate is noted. IMPRESSION: Vascular congestion likely related to volume overload. No other focal abnormality is noted. Electronically Signed   By: Alcide Clever M.D.   On: 03/28/2023 19:38    EKG: I independently viewed the EKG done and  my findings are as followed: EKG was not done in the ED  Assessment/Plan Present on Admission:  DKA (diabetic ketoacidosis) (HCC)  Essential hypertension  Acquired hypothyroidism  Principal Problem:   DKA (diabetic ketoacidosis) (HCC) Active Problems:   Essential hypertension   Acquired hypothyroidism   OSA on CPAP   Hypertensive emergency   Pseudohyponatremia   Hypoalbuminemia due to protein-calorie malnutrition (HCC)   End-stage renal disease on hemodialysis (HCC)   Chronic HFrEF (heart failure with reduced ejection fraction) (HCC)  Diabetic ketoacidosis type I Hyperglycemia secondary to poorly controlled type 1 diabetes mellitus Continue insulin drip, IV LR with IV potassium per DKA protocol Transition IV LR to D5 LR when serum glucose reaches 250mg /dL Continue serial BMP and VBG Continue to monitor for anion gap closure prior to transitioning patient to subcu insulin Continue NPO  Hypertensive emergency Essential hypertension  IV hydralazine 10 mg was given without any improvement in BP Patient was started on IV nicardipine drip  Pseudohyponatremia Na 130, corrected sodium based on CBG ( >600) = 138 Continue to monitor sodium levels  Hypoalbuminemia secondary to mild protein calorie malnutrition Albumin 3.3, protein supplement will be provided  ESRD on HD (TTS) Patient still forms urine Nephrology will be consulted for maintenance dialysis  OSA on CPAP Continue CPAP  Chronic HFrEF  Echocardiogram on 01/03/2023 showed LVEF of 45%, but this appeared to have improved in the LV done on 01/06/2023 with an EF greater than 55% per medical record Continue home meds  Acquired hypothyroidism Continue Synthroid   DVT prophylaxis: Heparin subcu   Advance Care Planning: CODE STATUS:Full code  Consults: None  Family Communication: None at bedside  Severity of Illness: The appropriate patient status for this patient is OBSERVATION. Observation status is judged to be  reasonable and necessary in order to provide the required intensity of service to ensure the patient's safety. The patient's presenting symptoms, physical exam findings, and initial radiographic and laboratory data in the context of their medical condition is felt to place them at decreased risk for further clinical deterioration. Furthermore, it is anticipated that the patient will be medically stable for discharge from the hospital within 2 midnights of admission.   Author: Frankey Shown, DO 03/29/2023 3:53 AM  For on call review [email or web address].

## 2023-03-28 NOTE — ED Provider Notes (Signed)
[place] EMERGENCY DEPARTMENT AT [facility] Provider Note   CSN: [id number] Arrival date & time: 03/28/23  1550     History {Add pertinent medical, surgical, social history, OB history to HPI:1} Chief Complaint  Patient presents with   Hyperglycemia    Jasmine [name] is a 52 y.o. female.  She is diabetic.  She has been feeling bad since last night.  She has had nausea and weakness.  Her insulin drip has not been working correctly so she has not been getting enough insulin.   Weakness Severity:  Moderate Onset quality:  Sudden Timing:  Constant      Home Medications Prior to Admission medications   Medication Sig Start Date End Date Taking? Authorizing Provider  aspirin EC 81 MG tablet Take 81 mg by mouth at bedtime.     [provider]  buPROPion (WELLBUTRIN XL) 300 MG 24 hr tablet Take 1 tablet (300 mg total) by mouth daily. 03/23/23 09/19/23  Neysa Hotter, MD  carvedilol (COREG) 12.5 MG tablet Take 12.5 mg by mouth 2 (two) times daily with a meal. 01/17/23 01/17/24  [provider]  Cholecalciferol 50 MCG (2000 UT) TABS Take 1 tablet by mouth daily.  07/26/18   [provider]  clopidogrel (PLAVIX) 75 MG tablet Take 1 tablet (75 mg total) by mouth daily. 02/22/23   Alver Sorrow, NP  Continuous Blood Gluc Sensor MISC 1 each by Does not apply route as directed. Use as directed every 14 days. May dispense FreeStyle [employer] or similar.    [provider]  dicyclomine (BENTYL) 10 MG capsule Take 10 mg by mouth 3 (three) times daily as needed for spasms.    [provider]  DULoxetine (CYMBALTA) 60 MG capsule Take 1 capsule (60 mg total) by mouth daily. 10/06/22 02/03/23  Neysa Hotter, MD  folic acid (FOLVITE) 400 MCG tablet Take 400 mcg by mouth every evening.     [provider]  glucagon (GLUCAGEN HYPOKIT) 1 MG SOLR injection GlucaGen HypoKit 1 mg Injection 03/13/20   [provider]   hydrALAZINE (APRESOLINE) 10 MG tablet Take 1 tablet (10 mg total) by mouth every 8 (eight) hours. Patient not taking: Reported on 03/25/2023 09/12/21   Catarina Hartshorn, MD  insulin detemir (LEVEMIR) 100 UNIT/ML FlexPen Inject 20 Units into the skin 2 (two) times daily. Patient taking differently: Inject 20 Units into the skin as needed. 09/12/21   Catarina Hartshorn, MD  Insulin Pen Needle 31G X 5 MM MISC Use with insulin pens to dispense insulin as directed 09/12/21   Tat, Onalee Hua, MD  lamoTRIgine (LAMICTAL) 200 MG tablet Take 1 tablet (200 mg total) by mouth daily. 11/25/22 04/24/23  Neysa Hotter, MD  levothyroxine (SYNTHROID) 175 MCG tablet TAKE ONE TABLET BY MOUTH DAILY AT 6am 11/20/21   [provider]  NOVOLOG 100 UNIT/ML injection Inject into the skin. Sliding Scale 03/15/22   [provider]  Omega-3 Fatty Acids (FISH OIL PO) Take 1 capsule by mouth daily.    [provider]  ondansetron (ZOFRAN ODT) 4 MG disintegrating tablet Take 1 tablet (4 mg total) by mouth every 8 (eight) hours as needed. 03/23/21   Levert Feinstein, MD  oxyCODONE-acetaminophen (PERCOCET/ROXICET) 5-325 MG tablet Take by mouth 4 (four) times daily as needed for severe pain.    [provider]  OXYGEN Inhale 3 L into the lungs as needed.    [provider]  PROAIR HFA 108 [phone] Base) MCG/ACT  inhaler Inhale 1-2 puffs into the lungs every 6 (six) hours as needed for wheezing or shortness of breath. 02/25/23   Glenford Bayley, NP  rizatriptan (MAXALT) 10 MG tablet Take 1 tablet (10 mg total) by mouth as needed for migraine. May repeat in 2 hours if needed 04/21/22   Levert Feinstein, MD  Torsemide 40 MG TABS Take 40 mg by mouth daily. 01/18/23   [provider]      Allergies    Ciprofloxacin, Levaquin [levofloxacin], Buspar [buspirone], Linaclotide, Advair diskus [fluticasone-salmeterol], Biaxin [clarithromycin], and Hydroxyzine    Review of Systems   Review of Systems  Neurological:  Positive for  weakness.    Physical Exam Updated Vital Signs BP (!) 148/61   Pulse 76   Temp 97.9 F (36.6 C) (Oral)   Resp 19   Ht 5\' 9"  (1.753 m)   Wt 118.4 kg   LMP 03/23/2017 (Approximate)   SpO2 96%   BMI 38.55 kg/m  Physical Exam  ED Results / Procedures / Treatments   Labs (all labs ordered are listed, but only abnormal results are displayed) Labs Reviewed  COMPREHENSIVE METABOLIC PANEL - Abnormal; Notable for the following components:      Result Value   Sodium 130 (*)    Chloride 95 (*)    Glucose, Bld 490 (*)    BUN 37 (*)    Creatinine, Ser 3.21 (*)    Albumin 3.3 (*)    GFR, Estimated 17 (*)    All other components within normal limits  CBC WITH DIFFERENTIAL/PLATELET - Abnormal; Notable for the following components:   RBC 3.83 (*)    Hemoglobin 11.5 (*)    All other components within normal limits  BLOOD GAS, VENOUS - Abnormal; Notable for the following components:   pCO2, Ven 43 (*)    Acid-base deficit 3.5 (*)    All other components within normal limits  BETA-HYDROXYBUTYRIC ACID - Abnormal; Notable for the following components:   Beta-Hydroxybutyric Acid 4.07 (*)    All other components within normal limits  CBG MONITORING, ED - Abnormal; Notable for the following components:   Glucose-Capillary >600 (*)    All other components within normal limits  I-STAT CHEM 8, ED - Abnormal; Notable for the following components:   Sodium 129 (*)    Chloride 93 (*)    BUN 34 (*)    Creatinine, Ser 3.50 (*)    Glucose, Bld 598 (*)    TCO2 19 (*)    Hemoglobin 10.5 (*)    HCT 31.0 (*)    All other components within normal limits  CBG MONITORING, ED - Abnormal; Notable for the following components:   Glucose-Capillary 566 (*)    All other components within normal limits  CBG MONITORING, ED - Abnormal; Notable for the following components:   Glucose-Capillary 433 (*)    All other components within normal limits  CBG MONITORING, ED - Abnormal; Notable for the following  components:   Glucose-Capillary 413 (*)    All other components within normal limits  CBG MONITORING, ED - Abnormal; Notable for the following components:   Glucose-Capillary 390 (*)    All other components within normal limits  CBG MONITORING, ED - Abnormal; Notable for the following components:   Glucose-Capillary 360 (*)    All other components within normal limits  CBG MONITORING, ED - Abnormal; Notable for the following components:   Glucose-Capillary 258 (*)    All other components within normal limits  CBG MONITORING, ED - Abnormal; Notable for the following components:   Glucose-Capillary 225 (*)    All other components within normal limits  URINALYSIS, ROUTINE W REFLEX MICROSCOPIC    EKG None  Radiology DG Chest Portable 1 View  Result Date: 03/28/2023 CLINICAL DATA:  Shortness of breath EXAM: PORTABLE CHEST 1 VIEW COMPARISON:  12/27/2022 FINDINGS: Cardiac shadow is stable. Vascular congestion is noted likely related to volume overload. Dialysis catheter is noted in place. No sizable effusion is seen. No focal infiltrate is noted. IMPRESSION: Vascular congestion likely related to volume overload. No other focal abnormality is noted. Electronically Signed   By: Alcide Clever M.D.   On: 03/28/2023 19:38    Procedures Procedures  {Document cardiac monitor, telemetry assessment procedure when appropriate:1}  Medications Ordered in ED Medications  insulin regular, human (MYXREDLIN) 100 units/ 100 mL infusion (4 Units/hr Intravenous Infusion Verify 03/28/23 2128)  dextrose 50 % solution 0-50 mL (has no administration in time range)  dextrose 5 % in lactated ringers infusion (0 mLs Intravenous Hold 03/28/23 1631)  lactated ringers bolus 2,368 mL (0 mLs Intravenous Stopped 03/28/23 2041)  potassium chloride 10 mEq in 100 mL IVPB (0 mEq Intravenous Stopped 03/28/23 2040)  ondansetron (ZOFRAN) injection 4 mg (4 mg Intravenous Given 03/28/23 1729)  alum & mag hydroxide-simeth  (MAALOX/MYLANTA) 200-200-20 MG/5ML suspension 30 mL (30 mLs Oral Given 03/28/23 2050)    ED Course/ Medical Decision Making/ A&P  CRITICAL CARE Performed by: Bethann Berkshire Total critical care time: 45 minutes Critical care time was exclusive of separately billable procedures and treating other patients. Critical care was necessary to treat or prevent imminent or life-threatening deterioration. Critical care was time spent personally by me on the following activities: development of treatment plan with patient and/or surrogate as well as nursing, discussions with consultants, evaluation of patient's response to treatment, examination of patient, obtaining history from patient or surrogate, ordering and performing treatments and interventions, ordering and review of laboratory studies, ordering and review of radiographic studies, pulse oximetry and re-evaluation of patient's condition.  {   Click here for ABCD2, HEART and other calculatorsREFRESH Note before signing :1}                              Medical Decision Making Amount and/or Complexity of Data Reviewed Labs: ordered. Radiology: ordered.  Risk OTC drugs. Prescription drug management. Decision regarding hospitalization.   Patient with early DKA.  She is admitted to medicine on insulin drip  {Document critical care time when appropriate:1} {Document review of labs and clinical decision tools ie heart score, Chads2Vasc2 etc:1}  {Document your independent review of radiology images, and any outside records:1} {Document your discussion with family members, caretakers, and with consultants:1} {Document social determinants of health affecting pt's care:1} {Document your decision making why or why not admission, treatments were needed:1} Final Clinical Impression(s) / ED Diagnoses Final diagnoses:  Diabetic ketoacidosis without coma associated with diabetes mellitus due to underlying condition (HCC)    Rx / DC Orders ED  Discharge Orders     None

## 2023-03-29 ENCOUNTER — Inpatient Hospital Stay (HOSPITAL_COMMUNITY): Payer: BC Managed Care – PPO

## 2023-03-29 DIAGNOSIS — E871 Hypo-osmolality and hyponatremia: Secondary | ICD-10-CM | POA: Diagnosis present

## 2023-03-29 DIAGNOSIS — Z992 Dependence on renal dialysis: Secondary | ICD-10-CM | POA: Diagnosis not present

## 2023-03-29 DIAGNOSIS — N390 Urinary tract infection, site not specified: Secondary | ICD-10-CM | POA: Diagnosis present

## 2023-03-29 DIAGNOSIS — J9610 Chronic respiratory failure, unspecified whether with hypoxia or hypercapnia: Secondary | ICD-10-CM | POA: Diagnosis present

## 2023-03-29 DIAGNOSIS — E1022 Type 1 diabetes mellitus with diabetic chronic kidney disease: Secondary | ICD-10-CM | POA: Diagnosis present

## 2023-03-29 DIAGNOSIS — G9341 Metabolic encephalopathy: Secondary | ICD-10-CM | POA: Diagnosis present

## 2023-03-29 DIAGNOSIS — E8809 Other disorders of plasma-protein metabolism, not elsewhere classified: Secondary | ICD-10-CM | POA: Insufficient documentation

## 2023-03-29 DIAGNOSIS — I161 Hypertensive emergency: Secondary | ICD-10-CM

## 2023-03-29 DIAGNOSIS — N186 End stage renal disease: Secondary | ICD-10-CM

## 2023-03-29 DIAGNOSIS — E039 Hypothyroidism, unspecified: Secondary | ICD-10-CM | POA: Diagnosis present

## 2023-03-29 DIAGNOSIS — T85614A Breakdown (mechanical) of insulin pump, initial encounter: Secondary | ICD-10-CM | POA: Diagnosis present

## 2023-03-29 DIAGNOSIS — E1010 Type 1 diabetes mellitus with ketoacidosis without coma: Secondary | ICD-10-CM | POA: Diagnosis present

## 2023-03-29 DIAGNOSIS — E441 Mild protein-calorie malnutrition: Secondary | ICD-10-CM | POA: Diagnosis present

## 2023-03-29 DIAGNOSIS — I340 Nonrheumatic mitral (valve) insufficiency: Secondary | ICD-10-CM | POA: Diagnosis present

## 2023-03-29 DIAGNOSIS — I272 Pulmonary hypertension, unspecified: Secondary | ICD-10-CM | POA: Diagnosis present

## 2023-03-29 DIAGNOSIS — E782 Mixed hyperlipidemia: Secondary | ICD-10-CM | POA: Diagnosis present

## 2023-03-29 DIAGNOSIS — I5042 Chronic combined systolic (congestive) and diastolic (congestive) heart failure: Secondary | ICD-10-CM | POA: Diagnosis present

## 2023-03-29 DIAGNOSIS — R7989 Other specified abnormal findings of blood chemistry: Secondary | ICD-10-CM | POA: Insufficient documentation

## 2023-03-29 DIAGNOSIS — J45909 Unspecified asthma, uncomplicated: Secondary | ICD-10-CM | POA: Diagnosis present

## 2023-03-29 DIAGNOSIS — F3181 Bipolar II disorder: Secondary | ICD-10-CM | POA: Diagnosis present

## 2023-03-29 DIAGNOSIS — I5022 Chronic systolic (congestive) heart failure: Secondary | ICD-10-CM | POA: Diagnosis not present

## 2023-03-29 DIAGNOSIS — D631 Anemia in chronic kidney disease: Secondary | ICD-10-CM | POA: Diagnosis present

## 2023-03-29 DIAGNOSIS — Z6839 Body mass index (BMI) 39.0-39.9, adult: Secondary | ICD-10-CM | POA: Diagnosis not present

## 2023-03-29 DIAGNOSIS — E0810 Diabetes mellitus due to underlying condition with ketoacidosis without coma: Secondary | ICD-10-CM | POA: Diagnosis not present

## 2023-03-29 DIAGNOSIS — G4733 Obstructive sleep apnea (adult) (pediatric): Secondary | ICD-10-CM | POA: Diagnosis not present

## 2023-03-29 DIAGNOSIS — I132 Hypertensive heart and chronic kidney disease with heart failure and with stage 5 chronic kidney disease, or end stage renal disease: Secondary | ICD-10-CM | POA: Diagnosis present

## 2023-03-29 DIAGNOSIS — N2581 Secondary hyperparathyroidism of renal origin: Secondary | ICD-10-CM | POA: Diagnosis present

## 2023-03-29 DIAGNOSIS — Y742 Prosthetic and other implants, materials and accessory general hospital and personal-use devices associated with adverse incidents: Secondary | ICD-10-CM | POA: Diagnosis present

## 2023-03-29 DIAGNOSIS — M069 Rheumatoid arthritis, unspecified: Secondary | ICD-10-CM | POA: Diagnosis present

## 2023-03-29 DIAGNOSIS — E46 Unspecified protein-calorie malnutrition: Secondary | ICD-10-CM | POA: Insufficient documentation

## 2023-03-29 LAB — URINALYSIS, W/ REFLEX TO CULTURE (INFECTION SUSPECTED)
Bilirubin Urine: NEGATIVE
Glucose, UA: 150 mg/dL — AB
Ketones, ur: 5 mg/dL — AB
Leukocytes,Ua: NEGATIVE
Nitrite: NEGATIVE
Protein, ur: >=300 mg/dL — AB
Specific Gravity, Urine: 1.026 (ref 1.005–1.030)
pH: 5 (ref 5.0–8.0)

## 2023-03-29 LAB — VITAMIN B12: Vitamin B-12: 362 pg/mL (ref 180–914)

## 2023-03-29 LAB — GLUCOSE, CAPILLARY
Glucose-Capillary: 217 mg/dL — ABNORMAL HIGH (ref 70–99)
Glucose-Capillary: 212 mg/dL — ABNORMAL HIGH (ref 70–99)
Glucose-Capillary: 253 mg/dL — ABNORMAL HIGH (ref 70–99)
Glucose-Capillary: 212 mg/dL — ABNORMAL HIGH (ref 70–99)
Glucose-Capillary: 225 mg/dL — ABNORMAL HIGH (ref 70–99)
Glucose-Capillary: 203 mg/dL — ABNORMAL HIGH (ref 70–99)
Glucose-Capillary: 209 mg/dL — ABNORMAL HIGH (ref 70–99)
Glucose-Capillary: 216 mg/dL — ABNORMAL HIGH (ref 70–99)
Glucose-Capillary: 206 mg/dL — ABNORMAL HIGH (ref 70–99)
Glucose-Capillary: 199 mg/dL — ABNORMAL HIGH (ref 70–99)
Glucose-Capillary: 191 mg/dL — ABNORMAL HIGH (ref 70–99)
Glucose-Capillary: 164 mg/dL — ABNORMAL HIGH (ref 70–99)
Glucose-Capillary: 305 mg/dL — ABNORMAL HIGH (ref 70–99)
Glucose-Capillary: 388 mg/dL — ABNORMAL HIGH (ref 70–99)

## 2023-03-29 LAB — HEMOGLOBIN A1C
Hgb A1c MFr Bld: 5.6 % (ref 4.8–5.6)
Mean Plasma Glucose: 114.02 mg/dL

## 2023-03-29 LAB — URINALYSIS, ROUTINE W REFLEX MICROSCOPIC
Bilirubin Urine: NEGATIVE
Glucose, UA: >=500 mg/dL — AB
Ketones, ur: 20 mg/dL — AB
Leukocytes,Ua: NEGATIVE
Nitrite: NEGATIVE
Protein, ur: >=300 mg/dL — AB
Specific Gravity, Urine: 1.01 (ref 1.005–1.030)
pH: 6 (ref 5.0–8.0)

## 2023-03-29 LAB — BASIC METABOLIC PANEL
Anion gap: 10 (ref 5–15)
Anion gap: 11 (ref 5–15)
Anion gap: 13 (ref 5–15)
Anion gap: 12 (ref 5–15)
BUN: 37 mg/dL — ABNORMAL HIGH (ref 6–20)
BUN: 39 mg/dL — ABNORMAL HIGH (ref 6–20)
BUN: 39 mg/dL — ABNORMAL HIGH (ref 6–20)
BUN: 20 mg/dL (ref 6–20)
CO2: 23 mmol/L (ref 22–32)
CO2: 23 mmol/L (ref 22–32)
CO2: 21 mmol/L — ABNORMAL LOW (ref 22–32)
CO2: 25 mmol/L (ref 22–32)
Calcium: 9.7 mg/dL (ref 8.9–10.3)
Calcium: 9.7 mg/dL (ref 8.9–10.3)
Calcium: 9.8 mg/dL (ref 8.9–10.3)
Calcium: 8.8 mg/dL — ABNORMAL LOW (ref 8.9–10.3)
Chloride: 96 mmol/L — ABNORMAL LOW (ref 98–111)
Chloride: 96 mmol/L — ABNORMAL LOW (ref 98–111)
Chloride: 97 mmol/L — ABNORMAL LOW (ref 98–111)
Chloride: 96 mmol/L — ABNORMAL LOW (ref 98–111)
Creatinine, Ser: 2.85 mg/dL — ABNORMAL HIGH (ref 0.44–1.00)
Creatinine, Ser: 2.98 mg/dL — ABNORMAL HIGH (ref 0.44–1.00)
Creatinine, Ser: 3.1 mg/dL — ABNORMAL HIGH (ref 0.44–1.00)
Creatinine, Ser: 1.75 mg/dL — ABNORMAL HIGH (ref 0.44–1.00)
GFR, Estimated: 19 mL/min — ABNORMAL LOW (ref >60)
GFR, Estimated: 18 mL/min — ABNORMAL LOW (ref >60)
GFR, Estimated: 18 mL/min — ABNORMAL LOW (ref >60)
GFR, Estimated: 35 mL/min — ABNORMAL LOW (ref >60)
Glucose, Bld: 210 mg/dL — ABNORMAL HIGH (ref 70–99)
Glucose, Bld: 225 mg/dL — ABNORMAL HIGH (ref 70–99)
Glucose, Bld: 218 mg/dL — ABNORMAL HIGH (ref 70–99)
Glucose, Bld: 240 mg/dL — ABNORMAL HIGH (ref 70–99)
Potassium: 4.7 mmol/L (ref 3.5–5.1)
Potassium: 4.1 mmol/L (ref 3.5–5.1)
Potassium: 4.2 mmol/L (ref 3.5–5.1)
Potassium: 4 mmol/L (ref 3.5–5.1)
Sodium: 129 mmol/L — ABNORMAL LOW (ref 135–145)
Sodium: 130 mmol/L — ABNORMAL LOW (ref 135–145)
Sodium: 131 mmol/L — ABNORMAL LOW (ref 135–145)
Sodium: 133 mmol/L — ABNORMAL LOW (ref 135–145)

## 2023-03-29 LAB — FOLATE: Folate: 19.4 ng/mL (ref >5.9)

## 2023-03-29 LAB — RAPID URINE DRUG SCREEN, HOSP PERFORMED
Amphetamines: NOT DETECTED
Barbiturates: NOT DETECTED
Benzodiazepines: NOT DETECTED
Cocaine: NOT DETECTED
Opiates: NOT DETECTED
Tetrahydrocannabinol: NOT DETECTED

## 2023-03-29 LAB — HIV ANTIBODY (ROUTINE TESTING W REFLEX): HIV Screen 4th Generation wRfx: NONREACTIVE

## 2023-03-29 LAB — AMMONIA: Ammonia: 27 umol/L (ref 9–35)

## 2023-03-29 LAB — CBC
HCT: 40 % (ref 36.0–46.0)
Hemoglobin: 13.2 g/dL (ref 12.0–15.0)
MCH: 30.1 pg (ref 26.0–34.0)
MCHC: 33 g/dL (ref 30.0–36.0)
MCV: 91.3 fL (ref 80.0–100.0)
Platelets: 214 10*3/uL (ref 150–400)
RBC: 4.38 MIL/uL (ref 3.87–5.11)
RDW: 12.9 % (ref 11.5–15.5)
WBC: 15.7 10*3/uL — ABNORMAL HIGH (ref 4.0–10.5)
nRBC: 0 % (ref 0.0–0.2)

## 2023-03-29 LAB — BETA-HYDROXYBUTYRIC ACID
Beta-Hydroxybutyric Acid: 2 mmol/L — ABNORMAL HIGH (ref 0.05–0.27)
Beta-Hydroxybutyric Acid: 2.85 mmol/L — ABNORMAL HIGH (ref 0.05–0.27)

## 2023-03-29 LAB — PHOSPHORUS: Phosphorus: 3.3 mg/dL (ref 2.5–4.6)

## 2023-03-29 LAB — TSH: TSH: 0.018 u[IU]/mL — ABNORMAL LOW (ref 0.350–4.500)

## 2023-03-29 LAB — HEPATITIS B SURFACE ANTIGEN: Hepatitis B Surface Ag: NONREACTIVE

## 2023-03-29 LAB — MRSA NEXT GEN BY PCR, NASAL: MRSA by PCR Next Gen: NOT DETECTED

## 2023-03-29 LAB — MAGNESIUM: Magnesium: 2 mg/dL (ref 1.7–2.4)

## 2023-03-29 LAB — PROCALCITONIN: Procalcitonin: 7.54 ng/mL

## 2023-03-29 MED ORDER — DEXTROSE 50 % IV SOLN: 0.0000 mL | INTRAVENOUS | Status: DC | PRN

## 2023-03-29 MED ORDER — HEPARIN SODIUM (PORCINE) 1000 UNIT/ML DIALYSIS
1000.0000 [IU] | INTRAMUSCULAR | Status: DC | PRN
  Administered 2023-03-29: 4100 [IU]

## 2023-03-29 MED ORDER — INSULIN REGULAR(HUMAN) IN NACL 100-0.9 UT/100ML-% IV SOLN
INTRAVENOUS | Status: DC
  Administered 2023-03-29: 3.2 [IU]/h via INTRAVENOUS
  Filled 2023-03-29: qty 100

## 2023-03-29 MED ORDER — INSULIN ASPART 100 UNIT/ML IJ SOLN
0.0000 [IU] | INTRAMUSCULAR | Status: DC
  Administered 2023-03-29: 2 [IU] via SUBCUTANEOUS

## 2023-03-29 MED ORDER — ACETAMINOPHEN 325 MG PO TABS
650.0000 mg | ORAL_TABLET | Freq: Four times a day (QID) | ORAL | Status: DC | PRN
  Administered 2023-03-29 – 2023-03-31 (×3): 650 mg via ORAL
  Filled 2023-03-29 (×3): qty 2

## 2023-03-29 MED ORDER — GLUCERNA SHAKE PO LIQD: 237.0000 mL | Freq: Three times a day (TID) | ORAL | Status: DC

## 2023-03-29 MED ORDER — CHLORHEXIDINE GLUCONATE CLOTH 2 % EX PADS
6.0000 | MEDICATED_PAD | Freq: Every day | CUTANEOUS | Status: DC
Start: 2023-03-29 — End: 2023-04-01
  Administered 2023-03-29 – 2023-03-30 (×3): 6 via TOPICAL

## 2023-03-29 MED ORDER — ALTEPLASE 2 MG IJ SOLR: 2.0000 mg | Freq: Once | INTRAMUSCULAR | Status: DC | PRN

## 2023-03-29 MED ORDER — POTASSIUM CHLORIDE 10 MEQ/100ML IV SOLN: 10.0000 meq | INTRAVENOUS | Status: DC

## 2023-03-29 MED ORDER — HEPARIN SODIUM (PORCINE) 5000 UNIT/ML IJ SOLN
5000.0000 [IU] | Freq: Three times a day (TID) | INTRAMUSCULAR | Status: DC
  Administered 2023-03-29 – 2023-03-30 (×5): 5000 [IU] via SUBCUTANEOUS
  Filled 2023-03-29 (×5): qty 1

## 2023-03-29 MED ORDER — INSULIN GLARGINE-YFGN 100 UNIT/ML ~~LOC~~ SOLN
35.0000 [IU] | Freq: Every day | SUBCUTANEOUS | Status: DC
  Administered 2023-03-29: 35 [IU] via SUBCUTANEOUS
  Filled 2023-03-29 (×3): qty 0.35

## 2023-03-29 MED ORDER — CARVEDILOL 12.5 MG PO TABS
12.5000 mg | ORAL_TABLET | Freq: Two times a day (BID) | ORAL | Status: DC
  Administered 2023-03-29 – 2023-03-31 (×5): 12.5 mg via ORAL
  Filled 2023-03-29 (×6): qty 1

## 2023-03-29 MED ORDER — ALBUTEROL SULFATE HFA 108 (90 BASE) MCG/ACT IN AERS
1.0000 | INHALATION_SPRAY | Freq: Four times a day (QID) | RESPIRATORY_TRACT | Status: DC | PRN
Start: 2023-03-29 — End: 2023-03-29

## 2023-03-29 MED ORDER — ACETAMINOPHEN 650 MG RE SUPP: 650.0000 mg | Freq: Four times a day (QID) | RECTAL | Status: DC | PRN

## 2023-03-29 MED ORDER — PRAVASTATIN SODIUM 40 MG PO TABS
80.0000 mg | ORAL_TABLET | Freq: Every day | ORAL | Status: DC
  Administered 2023-03-29 – 2023-03-30 (×2): 80 mg via ORAL
  Filled 2023-03-29 (×3): qty 2

## 2023-03-29 MED ORDER — LEVOTHYROXINE SODIUM 75 MCG PO TABS
175.0000 ug | ORAL_TABLET | Freq: Every day | ORAL | Status: DC
  Administered 2023-03-29 – 2023-03-31 (×3): 175 ug via ORAL
  Filled 2023-03-29 (×3): qty 1

## 2023-03-29 MED ORDER — INSULIN ASPART 100 UNIT/ML IJ SOLN
0.0000 [IU] | INTRAMUSCULAR | Status: DC
  Administered 2023-03-29: 9 [IU] via SUBCUTANEOUS
  Administered 2023-03-29: 7 [IU] via SUBCUTANEOUS
  Administered 2023-03-30: 3 [IU] via SUBCUTANEOUS
  Administered 2023-03-30: 7 [IU] via SUBCUTANEOUS
  Administered 2023-03-30: 3 [IU] via SUBCUTANEOUS

## 2023-03-29 MED ORDER — ORAL CARE MOUTH RINSE: 15.0000 mL | OROMUCOSAL | Status: DC | PRN

## 2023-03-29 MED ORDER — NICARDIPINE HCL IN NACL 20-0.86 MG/200ML-% IV SOLN
3.0000 mg/h | INTRAVENOUS | Status: DC
Start: 2023-03-29 — End: 2023-03-30
  Administered 2023-03-29 (×4): 5 mg/h via INTRAVENOUS
  Administered 2023-03-30 (×2): 3 mg/h via INTRAVENOUS
  Filled 2023-03-29 (×6): qty 200

## 2023-03-29 MED ORDER — ALUM & MAG HYDROXIDE-SIMETH 200-200-20 MG/5ML PO SUSP
30.0000 mL | ORAL | Status: DC | PRN
  Administered 2023-03-29 – 2023-03-31 (×4): 30 mL via ORAL
  Filled 2023-03-29 (×4): qty 30

## 2023-03-29 MED ORDER — ONDANSETRON HCL 4 MG/2ML IJ SOLN
4.0000 mg | Freq: Four times a day (QID) | INTRAMUSCULAR | Status: DC | PRN
  Administered 2023-03-29 – 2023-03-30 (×3): 4 mg via INTRAVENOUS
  Filled 2023-03-29 (×3): qty 2

## 2023-03-29 MED ORDER — ALBUTEROL SULFATE (2.5 MG/3ML) 0.083% IN NEBU: 2.5000 mg | INHALATION_SOLUTION | Freq: Four times a day (QID) | RESPIRATORY_TRACT | Status: DC | PRN

## 2023-03-29 MED ORDER — SODIUM CHLORIDE 0.9 % IV SOLN
2.0000 g | INTRAVENOUS | Status: DC
  Administered 2023-03-29 – 2023-03-30 (×2): 2 g via INTRAVENOUS
  Filled 2023-03-29 (×3): qty 20

## 2023-03-29 MED ORDER — CHLORHEXIDINE GLUCONATE CLOTH 2 % EX PADS
6.0000 | MEDICATED_PAD | Freq: Every day | CUTANEOUS | Status: DC
  Administered 2023-03-30 – 2023-03-31 (×2): 6 via TOPICAL

## 2023-03-29 MED ORDER — NEPRO/CARBSTEADY PO LIQD: 237.0000 mL | ORAL | Status: DC | PRN

## 2023-03-29 MED ORDER — FOLIC ACID 1 MG PO TABS
1.0000 mg | ORAL_TABLET | Freq: Every day | ORAL | Status: DC
  Administered 2023-03-29 – 2023-03-31 (×3): 1 mg via ORAL
  Filled 2023-03-29 (×3): qty 1

## 2023-03-29 MED ORDER — CLOPIDOGREL BISULFATE 75 MG PO TABS
75.0000 mg | ORAL_TABLET | Freq: Every day | ORAL | Status: DC
  Administered 2023-03-29 – 2023-03-31 (×3): 75 mg via ORAL
  Filled 2023-03-29 (×4): qty 1

## 2023-03-29 MED ORDER — DOXYCYCLINE HYCLATE 100 MG PO TABS
100.0000 mg | ORAL_TABLET | Freq: Two times a day (BID) | ORAL | Status: DC
  Administered 2023-03-29 – 2023-03-31 (×4): 100 mg via ORAL
  Filled 2023-03-29 (×4): qty 1

## 2023-03-29 MED ORDER — ASPIRIN 81 MG PO TBEC
81.0000 mg | DELAYED_RELEASE_TABLET | Freq: Every day | ORAL | Status: DC
  Administered 2023-03-29 – 2023-03-30 (×2): 81 mg via ORAL
  Filled 2023-03-29 (×2): qty 1

## 2023-03-29 MED ORDER — HYDRALAZINE HCL 20 MG/ML IJ SOLN
10.0000 mg | Freq: Four times a day (QID) | INTRAMUSCULAR | Status: DC | PRN
  Administered 2023-03-29: 10 mg via INTRAVENOUS
  Filled 2023-03-29: qty 1

## 2023-03-29 MED ORDER — ONDANSETRON HCL 4 MG PO TABS: 4.0000 mg | ORAL_TABLET | Freq: Four times a day (QID) | ORAL | Status: DC | PRN

## 2023-03-29 NOTE — Hospital Course (Addendum)
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  The patient states that her Dexcom glucose monitor was not working properly secondary to low battery.  There was also a malfunction with her insulin pump.  She checked her blood sugars and it was 600.  She tried giving herself some subcutaneous insulin without much improvement.  As result, she presented for further evaluation treatment. At the time of my evaluation in the morning of 03/29/2023, the patient remains somnolent.  She falls asleep without any stimulation.  She states that her CGM and insulin pump were not working properly.  She unable to clarify exactly what is going on with the insulin pump and feels that the battery and transmitter to his CGM is not working properly.  She endorses compliance with dialysis.  Last dialysis was on 10/19/20224.  She denies any fevers, chills, chest pain, shortness breath, cough, hemoptysis, abdominal pain.  She feels nauseous.  There is no diarrhea.  Notably, the patient had a recent hospital admission to [facility] from 12/27/2022 to 01/17/2023.  During that hospital admission, the patient was admitted for acute respiratory failure secondary to volume overload resulting from heart failure.  Echocardiogram at that time showed EF of 35-40%, grade 1 DD, mild MR, mild pulmonary hypertension.  Repeat echocardiogram was done during hospitalization showed EF of 45% with apical wall motion abnormalities and thinning and akinesis.  Cardiology was consulted and nuclear medicine stress test on 01/05/2023 showed no significant ischemia but did show a large moderately severe fixed perfusion deficit involving the apical segment consistent with possible LAD disease versus stress-induced cardiomyopathy.  Given the patient's CKD, cardiology  recommended medical management and outpatient follow-up.  The patient was started on clopidogrel and continued on her home carvedilol and statin.  She was discharged on her usual home nighttime oxygen.  The patient was initially diuresed with furosemide and metolazone.  Ultimately, decision was made to initiate the patient on HD.  She had a right-sided tunneled catheter placed and HD was initiated on 01/13/2023.

## 2023-03-29 NOTE — Plan of Care (Signed)

## 2023-03-29 NOTE — ED Notes (Signed)
ED TO INPATIENT HANDOFF REPORT  ED Nurse Name and Phone #: Ephriam Knuckles MEDIC   S Name/Age/Gender Jasmine [name] 52 y.o. female Room/Bed: APA05/APA05  Code Status   Code Status: Prior  Home/SNF/Other Home A&O x4 Is this baseline? Yes   Triage Complete: Triage complete  Chief Complaint DKA (diabetic ketoacidosis) (HCC) [E11.10]  Triage Note Pt arrived via [facility] c/o cbg reading greater than 600 and reading high all night. Hx of T1D and CHF, also a dialysis pt T, [facility], Saturday. C/o nausea, 4 mg of zofran given by EMS as well as NaCl bolus. Pt states she has been giving herself novolog to try and bring it down but it hasn't come down    Allergies Allergies  Allergen Reactions   Ciprofloxacin Swelling and Other (See Comments)    Per pt caused lips swell and nauseous feeling   Levaquin [Levofloxacin] Swelling and Other (See Comments)    Per pt caused lips swell and nauseous feeling   Buspar [Buspirone] Other (See Comments)    abd cramping   Linaclotide Other (See Comments)   Advair Diskus [Fluticasone-Salmeterol] Other (See Comments)    Thrush    Biaxin [Clarithromycin] Rash   Hydroxyzine Palpitations    Level of Care/Admitting Diagnosis ED Disposition     ED Disposition  Admit   Condition  --   Comment  Hospital Area: [facility] [[id number]]  Level of Care: Stepdown [14]  Covid Evaluation: Asymptomatic - no recent exposure (last 10 days) testing not required  Diagnosis: DKA (diabetic ketoacidosis) (HCC) [161096]  Admitting Physician: Frankey Shown [[id number]]  Attending Physician: Frankey Shown [[id number]]          B Medical/Surgery History Past Medical History:  Diagnosis Date   Anxiety    Arthritis    Asthma    Balance problems    Bipolar disorder (HCC)    Charcot ankle    Chronic fatigue    Chronic kidney disease    STAGE 3-4   Depression    Diabetes mellitus    DKA, type 1 (HCC) 11/04/2011   Elevated cholesterol    Fibromyalgia     GERD (gastroesophageal reflux disease)    Headache    History of suicidal ideation    Hyperlipemia    Hypertension    Hypothyroidism    IBS (irritable bowel syndrome)    Memory changes    Obesity    Sleep apnea    HAS C -PAP / DOES NOT USE   Stress incontinence    Pt had surgery to correct this.   Tachycardia    Tobacco abuse    Tremor    UTI (lower urinary tract infection)    Past Surgical History:  Procedure Laterality Date   INCONTINENCE SURGERY     NASAL FRACTURE SURGERY     ovary removed     OVARY SURGERY     PUBOVAGINAL SLING  08/16/2011   Procedure: Leonides Grills;  Surgeon: Valetta Fuller, MD;  Location: [facility];  Service: Urology;  Laterality: N/A;          UTERINE FIBROID SURGERY  2001     A IV Location/Drains/Wounds Patient Lines/Drains/Airways Status     Active Line/Drains/Airways     Name Placement date Placement time Site Days   Peripheral IV 03/28/23 18 G 1" Right Antecubital 03/28/23  1615  Antecubital  1   Peripheral IV 03/28/23 20 G 1" Left Antecubital 03/28/23  1616  Antecubital  1   Urethral Catheter  Elizabeth NT Latex;Straight-tip 14 Fr. 03/28/23  2343  Latex;Straight-tip  1            Intake/Output Last 24 hours  Intake/Output Summary (Last 24 hours) at 03/29/2023 0023 Last data filed at 03/28/2023 2128 Gross per 24 hour  Intake 2496.6 ml  Output --  Net 2496.6 ml    Labs/Imaging Results for orders placed or performed during the hospital encounter of 03/28/23 (from the past 48 hour(s))  CBG monitoring, ED     Status: Abnormal   Collection Time: 03/28/23  3:52 PM  Result Value Ref Range   Glucose-Capillary >600 (HH) 70 - 99 mg/dL    Comment: Glucose reference range applies only to samples taken after fasting for at least 8 hours.  I-stat chem 8, ED (not at [facility], [facility] or [facility])     Status: Abnormal   Collection Time: 03/28/23  5:04 PM  Result Value Ref Range   Sodium 129 (L) 135 - 145 mmol/L   Potassium 4.7 3.5 - 5.1 mmol/L    Chloride 93 (L) 98 - 111 mmol/L   BUN 34 (H) 6 - 20 mg/dL   Creatinine, Ser 4.09 (H) 0.44 - 1.00 mg/dL   Glucose, Bld 811 (HH) 70 - 99 mg/dL    Comment: Glucose reference range applies only to samples taken after fasting for at least 8 hours.   Calcium, Ion 1.31 1.15 - 1.40 mmol/L   TCO2 19 (L) 22 - 32 mmol/L   Hemoglobin 10.5 (L) 12.0 - 15.0 g/dL   HCT 91.4 (L) 78.2 - 95.6 %  CBG monitoring, ED     Status: Abnormal   Collection Time: 03/28/23  5:23 PM  Result Value Ref Range   Glucose-Capillary 566 (HH) 70 - 99 mg/dL    Comment: Glucose reference range applies only to samples taken after fasting for at least 8 hours.   Comment 1 Notify RN   CBG monitoring, ED     Status: Abnormal   Collection Time: 03/28/23  6:04 PM  Result Value Ref Range   Glucose-Capillary 433 (H) 70 - 99 mg/dL    Comment: Glucose reference range applies only to samples taken after fasting for at least 8 hours.  Comprehensive metabolic panel     Status: Abnormal   Collection Time: 03/28/23  6:25 PM  Result Value Ref Range   Sodium 130 (L) 135 - 145 mmol/L   Potassium 4.8 3.5 - 5.1 mmol/L   Chloride 95 (L) 98 - 111 mmol/L   CO2 22 22 - 32 mmol/L   Glucose, Bld 490 (H) 70 - 99 mg/dL    Comment: Glucose reference range applies only to samples taken after fasting for at least 8 hours.   BUN 37 (H) 6 - 20 mg/dL   Creatinine, Ser 2.13 (H) 0.44 - 1.00 mg/dL   Calcium 9.7 8.9 - 08.6 mg/dL   Total Protein 6.6 6.5 - 8.1 g/dL   Albumin 3.3 (L) 3.5 - 5.0 g/dL   AST 20 15 - 41 U/L   ALT 14 0 - 44 U/L   Alkaline Phosphatase 104 38 - 126 U/L   Total Bilirubin 1.1 0.3 - 1.2 mg/dL   GFR, Estimated 17 (L) >60 mL/min    Comment: (NOTE) Calculated using the CKD-EPI Creatinine Equation (2021)    Anion gap 13 5 - 15    Comment: Performed at [facility], [address], [place], [place] [address]  CBC with Differential     Status: Abnormal  Collection Time: 03/28/23  6:25 PM  Result Value Ref Range   WBC 8.3 4.0 -  10.5 K/uL   RBC 3.83 (L) 3.87 - 5.11 MIL/uL   Hemoglobin 11.5 (L) 12.0 - 15.0 g/dL   HCT 52.8 41.3 - 24.4 %   MCV 95.8 80.0 - 100.0 fL   MCH 30.0 26.0 - 34.0 pg   MCHC 31.3 30.0 - 36.0 g/dL   RDW 01.0 27.2 - 53.6 %   Platelets 205 150 - 400 K/uL   nRBC 0.0 0.0 - 0.2 %   Neutrophils Relative % 83 %   Neutro Abs 6.8 1.7 - 7.7 K/uL   Lymphocytes Relative 11 %   Lymphs Abs 0.9 0.7 - 4.0 K/uL   Monocytes Relative 5 %   Monocytes Absolute 0.4 0.1 - 1.0 K/uL   Eosinophils Relative 0 %   Eosinophils Absolute 0.0 0.0 - 0.5 K/uL   Basophils Relative 1 %   Basophils Absolute 0.1 0.0 - 0.1 K/uL   Immature Granulocytes 0 %   Abs Immature Granulocytes 0.03 0.00 - 0.07 K/uL    Comment: Performed at [facility], [address] [place], [place] [address]  Blood gas, venous (at [facility])     Status: Abnormal   Collection Time: 03/28/23  6:25 PM  Result Value Ref Range   pH, Ven 7.33 7.25 - 7.43   pCO2, Ven 43 (L) 44 - 60 mmHg   pO2, Ven 45 32 - 45 mmHg   Bicarbonate 22.7 20.0 - 28.0 mmol/L   Acid-base deficit 3.5 (H) 0.0 - 2.0 mmol/L   O2 Saturation 77.7 %   Patient temperature 36.6    Collection site BLOOD LEFT HAND    Drawn by [id number]     Comment: Performed at [facility], [address] [place], [place] [address]  Beta-hydroxybutyric acid     Status: Abnormal   Collection Time: 03/28/23  6:25 PM  Result Value Ref Range   Beta-Hydroxybutyric Acid 4.07 (H) 0.05 - 0.27 mmol/L    Comment: Performed at [facility], [address], [place], [place] [address]  CBG monitoring, ED     Status: Abnormal   Collection Time: 03/28/23  6:37 PM  Result Value Ref Range   Glucose-Capillary 413 (H) 70 - 99 mg/dL    Comment: Glucose reference range applies only to samples taken after fasting for at least 8 hours.  CBG monitoring, ED     Status: Abnormal   Collection Time: 03/28/23  7:17 PM  Result Value Ref Range   Glucose-Capillary 390 (H) 70 - 99 mg/dL    Comment: Glucose reference range applies  only to samples taken after fasting for at least 8 hours.  CBG monitoring, ED     Status: Abnormal   Collection Time: 03/28/23  8:16 PM  Result Value Ref Range   Glucose-Capillary 360 (H) 70 - 99 mg/dL    Comment: Glucose reference range applies only to samples taken after fasting for at least 8 hours.  CBG monitoring, ED     Status: Abnormal   Collection Time: 03/28/23  9:18 PM  Result Value Ref Range   Glucose-Capillary 258 (H) 70 - 99 mg/dL    Comment: Glucose reference range applies only to samples taken after fasting for at least 8 hours.  CBG monitoring, ED     Status: Abnormal   Collection Time: 03/28/23 10:36 PM  Result Value Ref Range   Glucose-Capillary 225 (H) 70 - 99 mg/dL  Comment: Glucose reference range applies only to samples taken after fasting for at least 8 hours.  CBG monitoring, ED     Status: Abnormal   Collection Time: 03/28/23 11:48 PM  Result Value Ref Range   Glucose-Capillary 193 (H) 70 - 99 mg/dL    Comment: Glucose reference range applies only to samples taken after fasting for at least 8 hours.   DG Chest Portable 1 View  Result Date: 03/28/2023 CLINICAL DATA:  Shortness of breath EXAM: PORTABLE CHEST 1 VIEW COMPARISON:  12/27/2022 FINDINGS: Cardiac shadow is stable. Vascular congestion is noted likely related to volume overload. Dialysis catheter is noted in place. No sizable effusion is seen. No focal infiltrate is noted. IMPRESSION: Vascular congestion likely related to volume overload. No other focal abnormality is noted. Electronically Signed   By: Alcide Clever M.D.   On: 03/28/2023 19:38    Pending Labs Unresulted Labs (From admission, onward)     Start     Ordered   03/28/23 1619  Urinalysis, Routine w reflex microscopic -Urine, Clean Catch  (Diabetes Ketoacidosis (DKA))  ONCE - STAT,   URGENT       Question:  Specimen Source  Answer:  Urine, Clean Catch   03/28/23 1620            Vitals/Pain Today's Vitals   03/28/23 1930 03/28/23  2015 03/28/23 2115 03/28/23 2245  BP: (!) 158/67 (!) 182/105 (!) 189/94 (!) 148/61  Pulse: 70 71 71 76  Resp: 20 13 16 19   Temp:      TempSrc:      SpO2: 98% 97% 100% 96%  Weight:      Height:      PainSc:        Isolation Precautions No active isolations  Medications Medications  insulin regular, human (MYXREDLIN) 100 units/ 100 mL infusion (4 Units/hr Intravenous Infusion Verify 03/28/23 2128)  dextrose 50 % solution 0-50 mL (has no administration in time range)  dextrose 5 % in lactated ringers infusion ( Intravenous New Bag/Given 03/28/23 2359)  lactated ringers bolus 2,368 mL (0 mLs Intravenous Stopped 03/28/23 2041)  potassium chloride 10 mEq in 100 mL IVPB (0 mEq Intravenous Stopped 03/28/23 2040)  ondansetron (ZOFRAN) injection 4 mg (4 mg Intravenous Given 03/28/23 1729)  alum & mag hydroxide-simeth (MAALOX/MYLANTA) 200-200-20 MG/5ML suspension 30 mL (30 mLs Oral Given 03/28/23 2050)    Mobility Normally walks without assistance. In ED needed assistance to bathroom.      Focused Assessments    R Recommendations: See Admitting Provider Note  Report given to: Namira   Additional Notes: Patient has foley placed. Dialysis Tuesday, Thursday, Saturday.

## 2023-03-29 NOTE — Progress Notes (Signed)
   HEMODIALYSIS TREATMENT NOTE:  Uneventful 3 hour treatment completed using RIJ TDC - exit site is unremarkable. Goal met: 2.3 liters removed without interruption in UF.  All blood was returned.    03/29/23 1530  Vitals  BP (!) 96/50  MAP (mmHg) 67  BP Location Left Wrist  BP Method Automatic  Patient Position (if appropriate) Lying  Pulse Rate 86  Pulse Rate Source Monitor  ECG Heart Rate 89  Resp 16  Oxygen Therapy  SpO2 93 %  O2 Device Nasal Cannula  O2 Flow Rate (L/min) 2 L/min  Hepatitis B Pre Treatment Patient Checks  Hepatitis B Surface Antigen Results Pending (labs drawn, awaiting results)  Date Hepatitis B Surface Antigen Drawn 03/29/23  Hep B Antibody Quant/Post still pending  Date Hep B Antibody Quant/Post Drawn 03/29/23  Patient's Immunity Status Pending (labs drawn, awaiting results)  Isolation Initiated Unknown Hepatitis status (Tablo 3 for chemical disinfection)  Post Treatment  Dialyzer Clearance Lightly streaked  Hemodialysis Intake (mL) 0 mL  Liters Processed 71.9  Fluid Removed (mL) 2300 mL  Tolerated HD Treatment Yes  Post-Hemodialysis Comments Goal met  Hemodialysis Catheter Right Internal jugular Double lumen Permanent (Tunneled)  No placement date or time found.   Placed prior to admission: Yes  Orientation: Right  Access Location: Internal jugular  Hemodialysis Catheter Type: Double lumen Permanent (Tunneled)  Site Condition No complications  Blue Lumen Status Flushed;Heparin locked;Dead end cap in place  Red Lumen Status Flushed;Heparin locked;Dead end cap in place  Purple Lumen Status N/A  Catheter fill solution Heparin 1000 units/ml  Catheter fill volume (Arterial) 2 cc  Catheter fill volume (Venous) 2.1  Dressing Type Transparent;Tube stabilization device  Dressing Status Antimicrobial disc in place  Interventions New dressing  Drainage Description None  Dressing Change Due 04/05/23  Post treatment catheter status Capped and Clamped     Arman Filter, RN AP [address]

## 2023-03-29 NOTE — Progress Notes (Addendum)
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  The patient states that her Dexcom glucose monitor was not working properly secondary to low battery.  There was also a malfunction with her insulin pump.  She checked her blood sugars and it was 600.  She tried giving herself some subcutaneous insulin without much improvement.  As result, she presented for further evaluation treatment. At the time of my evaluation in the morning of 03/29/2023, the patient remains somnolent.  She falls asleep without any stimulation.  She states that her CGM and insulin pump were not working properly.  She unable to clarify exactly what is going on with the insulin pump and feels that the battery and transmitter to his CGM is not working properly.  She endorses compliance with dialysis.  Last dialysis was on 10/19/20224.  She denies any fevers, chills, chest pain, shortness breath, cough, hemoptysis, abdominal pain.  She feels nauseous.  There is no diarrhea.  Notably, the patient had a recent hospital admission to [facility] from 12/27/2022 to 01/17/2023.  During that hospital admission, the patient was admitted for acute respiratory failure secondary to volume overload resulting from heart failure.  Echocardiogram at that time showed EF of 35-40%, grade 1 DD, mild MR, mild pulmonary hypertension.  Repeat echocardiogram was done during hospitalization showed EF of 45% with apical wall motion abnormalities and thinning and akinesis.  Cardiology was consulted and nuclear medicine stress test on 01/05/2023 showed no significant ischemia but did show a large moderately severe fixed perfusion  deficit involving the apical segment consistent with possible LAD disease versus stress-induced cardiomyopathy.  Given the patient's CKD, cardiology recommended medical management and outpatient follow-up.  The patient was started on clopidogrel and continued on her home carvedilol and statin.  She was discharged on her usual home nighttime oxygen.  The patient was initially diuresed with furosemide and metolazone.  Ultimately, decision was made to initiate the patient on HD.  She had a right-sided tunneled catheter placed and HD was initiated on 01/13/2023.      Assessment/Plan: DKA (diabetic ketoacidosis) (HCC) -patient started on IV insulin with q 1 hour CBG check and q 4 hour BMPs -pt started on aggressive fluid resuscitation -Electrolytes were monitored and repleted -transitioned to [facility] insulin once anion gap closed-as there is concern that her insulin pump is not working properly -diet was advanced once anion gap closed -12/2022 A1C--6.6  Leukemoid reaction -Check PCT -Blood cultures x 2 sets -Personally reviewed chest x-ray--vascular congestion, no consolidation   Acute metabolic encephalopathy 03/29/2023--patient is awake, answers questions slow, falls asleep without stimulation -Multifactorial including hyponatremia, possible infection, DKA -Check ammonia -CT brain -B12 -TSH -obtain UA -Hold cymbalta lorazepam, and lamictal temporarily  ESRD -Patient dialyzes on Tuesday, Thursday, Saturday -Nephrology consulted for maintenance dialysis -She has residual renal function and continues on torsemide 40 mg daily  HTN Urgency Restart carvedilol -Taken off hydralazine secondary to hypotension on dialysis -Wean off nicardipine drip -wean cardene drip for SBP <160  Bipolar II disorder (HCC) Hold ativan temporarily Hold cymbalta and lamictal temporarily   Mixed hyperlipidemia Continue statin   Chronic migraine w/o aura w/o status migrainosus, not intractable Holding Maxalt  temporarily  secondary to elevated blood pressure   Class 2 obesity BMI 39.00 Lifestyle modification   Hypothyroidism continue synthroid to 175 mcg daily   Hyponatremia Secondary to volume depletion and hyperglycemia  OSA -continue CPAP   History of BJ:YNWGNFAOZ 2021, patient reports she is on disability for this and previously followed with rheum but stopped as the medications were not helping   Family Communication: no  Family at bedside  Consultants:  renal  Code Status:  FULL   DVT Prophylaxis:  [place] Heparin    Procedures: As Listed in Progress Note Above  Antibiotics: None       Subjective: Patient denies fevers, chills, headache, chest pain, dyspnea, nausea, vomiting, diarrhea, abdominal pain, dysuria, hematuria, hematochezia, and melena.   Objective: Vitals:   03/29/23 0645 03/29/23 0700 03/29/23 0800 03/29/23 0833  BP: (!) 145/31 (!) 149/34 (!) 161/61   Pulse: 86 85 85 92  Resp: 14 18 (!) 23 (!) 25  Temp:      TempSrc:      SpO2: 100% 100% 100% 95%  Weight:      Height:        Intake/Output Summary (Last 24 hours) at 03/29/2023 0920 Last data filed at 03/29/2023 3086 Gross per 24 hour  Intake 2744.4 ml  Output 2300 ml  Net 444.4 ml   Weight change:  Exam:  General:  Pt is alert, follows commands appropriately, not in acute distress HEENT: No icterus, No thrush, No neck mass, [place]/AT Cardiovascular: RRR, S1/S2, no rubs, no gallops Respiratory: Bibasal crackles. No wheeze Abdomen: Soft/+BS, non tender, non distended, no guarding Extremities: No edema, No lymphangitis, No petechiae, No rashes, no synovitis   Data Reviewed: I have personally reviewed following labs and imaging studies Basic Metabolic Panel: Recent Labs  Lab 03/28/23 1704 03/28/23 1825 03/29/23 0318 03/29/23 0319 03/29/23 0738  NA 129* 130*  --  129* 130*  K 4.7 4.8  --  4.7 4.1  CL 93* 95*  --  96* 96*  CO2  --  22  --  23 23  GLUCOSE 598* 490*  --  210* 225*  BUN  34* 37*  --  37* 39*  CREATININE 3.50* 3.21*  --  2.85* 2.98*  CALCIUM  --  9.7  --  9.7 9.7  MG  --   --  2.0  --   --   PHOS  --   --  3.3  --   --    Liver Function Tests: Recent Labs  Lab 03/28/23 1825  AST 20  ALT 14  ALKPHOS 104  BILITOT 1.1  PROT 6.6  ALBUMIN 3.3*   No results for input(s): "LIPASE", "AMYLASE" in the last 168 hours. No results for input(s): "AMMONIA" in the last 168 hours. Coagulation Profile: No results for input(s): "INR", "PROTIME" in the last 168 hours. CBC: Recent Labs  Lab 03/28/23 1704 03/28/23 1825 03/29/23 0318  WBC  --  8.3 15.7*  NEUTROABS  --  6.8  --   HGB 10.5* 11.5* 13.2  HCT 31.0* 36.7 40.0  MCV  --  95.8 91.3  PLT  --  205 214   Cardiac Enzymes: No results for input(s): "CKTOTAL", "CKMB", "CKMBINDEX", "TROPONINI" in the last 168 hours. BNP: Invalid input(s): "POCBNP" CBG: Recent Labs  Lab 03/29/23 0519 03/29/23 0627 03/29/23 0723 03/29/23 0821 03/29/23 0918  GLUCAP 212* 225* 216* 209* 206*   HbA1C: Recent Labs    03/28/23 1825  HGBA1C 5.6   Urine analysis:  Component Value Date/Time   COLORURINE STRAW (A) 03/28/2023 2341   APPEARANCEUR CLEAR 03/28/2023 2341   LABSPEC 1.010 03/28/2023 2341   PHURINE 6.0 03/28/2023 2341   GLUCOSEU >=500 (A) 03/28/2023 2341   HGBUR SMALL (A) 03/28/2023 2341   BILIRUBINUR NEGATIVE 03/28/2023 2341   KETONESUR 20 (A) 03/28/2023 2341   PROTEINUR >=300 (A) 03/28/2023 2341   UROBILINOGEN 0.2 03/19/2015 1525   NITRITE NEGATIVE 03/28/2023 2341   LEUKOCYTESUR NEGATIVE 03/28/2023 2341   Sepsis Labs: @LABRCNTIP (procalcitonin:4,lacticidven:4) ) Recent Results (from the past 240 hour(s))  MRSA Next Gen by PCR, Nasal     Status: None   Collection Time: 03/29/23 12:35 AM   Specimen: Nasal Mucosa; Nasal Swab  Result Value Ref Range Status   MRSA by PCR Next Gen NOT DETECTED NOT DETECTED Final    Comment: (NOTE) The GeneXpert MRSA Assay (FDA approved for NASAL specimens only), is  one component of a comprehensive MRSA colonization surveillance program. It is not intended to diagnose MRSA infection nor to guide or monitor treatment for MRSA infections. Test performance is not FDA approved in patients less than 58 years old. Performed at [facility], [address], [place], [place] [address]      Scheduled Meds:  aspirin EC  81 mg Oral QHS   carvedilol  12.5 mg Oral BID WC   Chlorhexidine Gluconate Cloth  6 each Topical Daily   clopidogrel  75 mg Oral Daily   feeding supplement (GLUCERNA SHAKE)  237 mL Oral TID BM   folic acid  1 mg Oral Daily   heparin  5,000 Units Subcutaneous Q8H   insulin aspart  0-9 Units Subcutaneous Q4H   insulin glargine-yfgn  35 Units Subcutaneous Daily   levothyroxine  175 mcg Oral Q0600   pravastatin  80 mg Oral q1800   Continuous Infusions:  dextrose 5% lactated ringers 125 mL/hr at 03/29/23 0823   insulin 3.2 Units/hr (03/29/23 0526)   niCARDipine 5 mg/hr (03/29/23 0343)    Procedures/Studies: DG Chest Portable 1 View  Result Date: 03/28/2023 CLINICAL DATA:  Shortness of breath EXAM: PORTABLE CHEST 1 VIEW COMPARISON:  12/27/2022 FINDINGS: Cardiac shadow is stable. Vascular congestion is noted likely related to volume overload. Dialysis catheter is noted in place. No sizable effusion is seen. No focal infiltrate is noted. IMPRESSION: Vascular congestion likely related to volume overload. No other focal abnormality is noted. Electronically Signed   By: Alcide Clever M.D.   On: 03/28/2023 19:38    Catarina Hartshorn, DO  [facility]  If 7PM-7AM, please contact night-coverage [email or web address] Password [email or web address] 03/29/2023, 9:20 AM   LOS: 0 days

## 2023-03-29 NOTE — Progress Notes (Signed)
   03/29/23 1319  TOC Brief Assessment  Insurance and Status Reviewed  Patient has primary care physician Yes  Home environment has been reviewed from home  Prior level of function: independent  Prior/Current Home Services No current home services  Social Determinants of Health Reivew SDOH reviewed no interventions necessary  Readmission risk has been reviewed Yes  Transition of care needs no transition of care needs at this time    Transition of Care Department [facility]) has reviewed patient and no TOC needs have been identified at this time. We will continue to monitor patient advancement through interdisciplinary progression rounds. If new patient transition needs arise, please place a TOC consult.

## 2023-03-29 NOTE — Inpatient Diabetes Management (Addendum)
Inpatient Diabetes Program Recommendations  AACE/ADA: New Consensus Statement on Inpatient Glycemic Control   Target Ranges:  Prepandial:   less than 140 mg/dL      Peak postprandial:   less than 180 mg/dL (1-2 hours)      Critically ill patients:  140 - 180 mg/dL    Latest Reference Range & Units 03/29/23 00:52 03/29/23 01:55 03/29/23 03:05 03/29/23 04:17 03/29/23 05:19 03/29/23 06:27 03/29/23 07:23  Glucose-Capillary 70 - 99 mg/dL 621 (H) 308 (H) 657 (H) 253 (H) 212 (H) 225 (H) 216 (H)    Latest Reference Range & Units 03/28/23 17:04 03/28/23 18:25  CO2 22 - 32 mmol/L  22  Glucose 70 - 99 mg/dL 846 (HH) 962 (H)  Anion gap 5 - 15   13    Latest Reference Range & Units 03/28/23 18:25 03/29/23 03:19  Beta-Hydroxybutyric Acid 0.05 - 0.27 mmol/L 4.07 (H) 2.00 (H)   Review of Glycemic Control  Diabetes history: DM1 (does not make any insulin; requires basal, correction, and carbohydrate coverage) Outpatient Diabetes medications: Levemir 20 units daily, Novolog via insulin pump Current orders for Inpatient glycemic control: IV insulin  Inpatient Diabetes Program Recommendations:    Insulin: Once acidosis resolved and provider ready to transition from IV to SQ insulin, please consider ordering Semglee 35 units Q24H, CBGs Q4H, Novolog 0-9 units Q4H, and ordering Novolog 3 units TID with meals for meal coverage if patient eats at least 50% of meals.  NOTE: Patient has hx of DM1 and uses an insulin pump for DM control. Per H&P on 03/28/23 patient reported her Dexcom G6 CGM was not working and she has not been getting enough insulin via insulin pump. In reviewing chart, noted patient seen Dr. Katrinka Blazing ([facility]) on 02/18/23. Per office note on 02/18/23 patient was using T-Slim insulin pump and Dexcom G6 CGM for DM control. No pump settings noted in office visit note. Patient presented to ED on 03/28/23 with glucose of 598 mg/dl and was started on IV insulin for DKA which is still  currently ordered. Called patient's room phone but no answer. Will plan to speak with patient today.   Addendum 03/29/23@15 :20-Spoke with patient and husband at bedside regarding DM. Patient states that her Dexcom G6 CGM sensor stopped working and her glucose went up. Patient states that she was not alerted from her Dexcom G6 app that it had stopped working. Patient states by the time she noted it wasn't working her glucose was reading "HI" on glucometer. Patient reports that she applied a new Dexcom (currently on right lateral thigh and reading 258 mg/dl) and a new infusion site. Patient states she usually uses the Control IQ mode (auto mode) on pump so it adjust insulin delivery based on glucose trends.   Patient states that she has additional Dexcom G6 sensors at home and [employer] is in the process of sending her a new transmitter and sensor to replace the one that stopped working. Patient uses the steel needle infusion sites with T-Slim insulin pump. The site she had on when she presented to the ED has been removed and in patient belonging bag at bedside. Reviewed insulin pump settings which are as follows:  Basal 12A 2.0 units/hr 12P 2.0 units/hr 7P 2.0 units/hr Total Basal: 48 units per 24 hours  Insulin to Carb Ratio 12A 1:30 grams 12P 1:25 grams 7P    1:25 grams  Insulin Sensitivity 12A  1:50 mg/dl 95M  8:41 mg/dl 7P    3:24 mg/dl  Patient states that she does not currently have any new insulin pump supplies at bedside but her husband will plan to bring new pump supplies to the hospital to have available so patient can resume insulin pump prior to discharge. Discussed that she received Semglee 35 units at 10:15 am today which will be working for 24 hours. Discussed that if insulin pump will be resumed prior to discharge, it will need to be resumed about 24 hours from last dose of basal insulin given. Informed patient that diabetes coordinator will plan to reach out to her in the morning to  find out if she has all insulin pump supplies and if so, will communicate with attending provider to see if pump could be resumed. Patient reports she has all needed DM medications and supplies at home.  Patient verbalized understanding and has no questions at this time.   Thanks, Orlando Penner, RN, MSN, CDCES Diabetes Coordinator Inpatient Diabetes Program [phone] (Team Pager from 8am to 5pm)

## 2023-03-29 NOTE — Procedures (Signed)
I was present at this dialysis session. I have reviewed the session itself and made appropriate changes.   Vital signs in last 24 hours:  Temp:  [97.5 F (36.4 C)-98.3 F (36.8 C)] 97.7 F (36.5 C) (10/22 1200) Pulse Rate:  [69-92] 77 (10/22 1215) Resp:  [5-28] 23 (10/22 1215) BP: (128-253)/(30-115) 133/37 (10/22 1215) SpO2:  [93 %-100 %] 94 % (10/22 1200) Weight:  [118.4 kg-119.8 kg] 119.8 kg (10/22 0055) Weight change:  Filed Weights   03/28/23 1553 03/29/23 0055  Weight: 118.4 kg 119.8 kg    Recent Labs  Lab 03/29/23 0318 03/29/23 0319 03/29/23 1025  NA  --    < > 131*  K  --    < > 4.2  CL  --    < > 97*  CO2  --    < > 21*  GLUCOSE  --    < > 218*  BUN  --    < > 39*  CREATININE  --    < > 3.10*  CALCIUM  --    < > 9.8  PHOS 3.3  --   --    < > = values in this interval not displayed.    Recent Labs  Lab 03/28/23 1704 03/28/23 1825 03/29/23 0318  WBC  --  8.3 15.7*  NEUTROABS  --  6.8  --   HGB 10.5* 11.5* 13.2  HCT 31.0* 36.7 40.0  MCV  --  95.8 91.3  PLT  --  205 214    Scheduled Meds:  aspirin EC  81 mg Oral QHS   carvedilol  12.5 mg Oral BID WC   Chlorhexidine Gluconate Cloth  6 each Topical Daily   Chlorhexidine Gluconate Cloth  6 each Topical Q0600   clopidogrel  75 mg Oral Daily   feeding supplement (GLUCERNA SHAKE)  237 mL Oral TID BM   folic acid  1 mg Oral Daily   heparin  5,000 Units Subcutaneous Q8H   insulin aspart  0-9 Units Subcutaneous Q4H   insulin glargine-yfgn  35 Units Subcutaneous Daily   levothyroxine  175 mcg Oral Q0600   pravastatin  80 mg Oral q1800   Continuous Infusions:  dextrose 5% lactated ringers 125 mL/hr at 03/29/23 0823   insulin 3.2 Units/hr (03/29/23 0526)   niCARDipine 5 mg/hr (03/29/23 1037)   PRN Meds:.acetaminophen **OR** acetaminophen, albuterol, alteplase, dextrose, feeding supplement (NEPRO CARB STEADY), heparin, ondansetron **OR** ondansetron (ZOFRAN) IV, mouth rinse   Irena Cords,   MD 03/29/2023, 12:43 PM

## 2023-03-29 NOTE — Consult Note (Signed)
[facility] Renal Consultation Note    Indication for Consultation:  Management of ESRD/hemodialysis; anemia, hypertension/volume and secondary hyperparathyroidism  HPI: Jasmine [name] is a 52 y.o. 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  She checked her CBG and was 600.  In the ED, VSS, Spo2 98% on 3 LPM, labs notable for Na 129, K 4.7, Cl 95, Co2 22, glucose >600, venous pH 7.33, beta-hydroxybutyric acid 4.07.  CXR with vascular congestion.  She was admitted for management of DKA and we were consulted to provide dialysis during her hospitalization.    Of note, she was admitted to [facility] on 7/22-8/12/24 with new onset HFrEF and had DKA at that time as well.  Past Medical History:  Diagnosis Date   Anxiety    Arthritis    Asthma    Balance problems    Bipolar disorder (HCC)    Charcot ankle    Chronic fatigue    Chronic kidney disease    STAGE 3-4   Depression    Diabetes mellitus    DKA, type 1 (HCC) 11/04/2011   Elevated cholesterol    Fibromyalgia    GERD (gastroesophageal reflux disease)    Headache    History of suicidal ideation    Hyperlipemia    Hypertension    Hypothyroidism    IBS (irritable bowel syndrome)    Memory changes    Obesity    Sleep apnea    HAS C -PAP / DOES NOT USE   Stress incontinence    Pt had surgery to correct this.   Tachycardia    Tobacco abuse    Tremor    UTI (lower urinary tract infection)    Past Surgical History:  Procedure Laterality Date   INCONTINENCE SURGERY     NASAL FRACTURE SURGERY     ovary removed     OVARY SURGERY     PUBOVAGINAL SLING  08/16/2011   Procedure: Leonides Grills;  Surgeon: Valetta Fuller, MD;  Location: [facility] ORS;  Service: Urology;  Laterality: N/A;          UTERINE FIBROID SURGERY  2001   Family History:   Family History  Problem Relation Age of Onset   Asthma Mother    Bipolar disorder Mother    Heart disease Father    Lymphoma Father    Hypertension Father    Thyroid disease Father    Hyperlipidemia Father    Diabetes Father    Cancer Paternal Grandmother        lung and breast   Bladder Cancer Paternal Grandfather    Suicidality Maternal Grandfather    Thyroid disease Brother    Social History:  reports that she quit smoking about 11 years ago. Her smoking use included cigarettes. She started smoking about 31 years ago. She has a 15 pack-year smoking history. She has never used smokeless tobacco. She reports that she does not drink alcohol and does not use drugs. Allergies  Allergen Reactions   Ciprofloxacin Swelling and Other (See Comments)    Per pt caused lips swell and nauseous feeling   Levaquin [Levofloxacin] Swelling and Other (See Comments)    Per pt caused lips  swell and nauseous feeling   Buspar [Buspirone] Other (See Comments)    abd cramping   Linaclotide Other (See Comments)   Advair Diskus [Fluticasone-Salmeterol] Other (See Comments)    Thrush    Biaxin [Clarithromycin] Rash   Hydroxyzine Palpitations   Prior to Admission medications   Medication Sig Start Date End Date Taking? Authorizing Provider  aspirin EC 81 MG tablet Take 81 mg by mouth at bedtime.     [provider]  buPROPion (WELLBUTRIN XL) 300 MG 24 hr tablet Take 1 tablet (300 mg total) by mouth daily. 03/23/23 09/19/23  Neysa Hotter, MD  carvedilol (COREG) 12.5 MG tablet Take 12.5 mg by mouth 2 (two) times daily with a meal. 01/17/23 01/17/24  [provider]  Cholecalciferol 50 MCG (2000 UT) TABS Take 1 tablet by mouth daily.  07/26/18   [provider]  clopidogrel (PLAVIX) 75 MG tablet Take 1 tablet (75 mg total) by  mouth daily. 02/22/23   Alver Sorrow, NP  Continuous Blood Gluc Sensor MISC 1 each by Does not apply route as directed. Use as directed every 14 days. May dispense FreeStyle [employer] or similar.    [provider]  dicyclomine (BENTYL) 10 MG capsule Take 10 mg by mouth 3 (three) times daily as needed for spasms.    [provider]  DULoxetine (CYMBALTA) 60 MG capsule Take 1 capsule (60 mg total) by mouth daily. 10/06/22 02/03/23  Neysa Hotter, MD  folic acid (FOLVITE) 400 MCG tablet Take 400 mcg by mouth every evening.     [provider]  glucagon (GLUCAGEN HYPOKIT) 1 MG SOLR injection GlucaGen HypoKit 1 mg Injection 03/13/20   [provider]  hydrALAZINE (APRESOLINE) 10 MG tablet Take 1 tablet (10 mg total) by mouth every 8 (eight) hours. Patient not taking: Reported on 03/25/2023 09/12/21   Catarina Hartshorn, MD  insulin detemir (LEVEMIR) 100 UNIT/ML FlexPen Inject 20 Units into the skin 2 (two) times daily. Patient taking differently: Inject 20 Units into the skin as needed. 09/12/21   Catarina Hartshorn, MD  Insulin Pen Needle 31G X 5 MM MISC Use with insulin pens to dispense insulin as directed 09/12/21   Tat, Onalee Hua, MD  lamoTRIgine (LAMICTAL) 200 MG tablet Take 1 tablet (200 mg total) by mouth daily. 11/25/22 04/24/23  Neysa Hotter, MD  levothyroxine (SYNTHROID) 175 MCG tablet TAKE ONE TABLET BY MOUTH DAILY AT 6am 11/20/21   [provider]  NOVOLOG 100 UNIT/ML injection Inject into the skin. Sliding Scale 03/15/22   [provider]  Omega-3 Fatty Acids (FISH OIL PO) Take 1 capsule by mouth daily.    [provider]  ondansetron (ZOFRAN ODT) 4 MG disintegrating tablet Take 1 tablet (4 mg total) by mouth every 8 (eight) hours as needed. 03/23/21   Levert Feinstein, MD  oxyCODONE-acetaminophen (PERCOCET/ROXICET) 5-325 MG tablet Take by mouth 4 (four) times daily as needed for severe pain.    [provider]  OXYGEN Inhale 3 L into the  lungs as needed.    [provider]  PROAIR HFA 108 [phone] Base) MCG/ACT inhaler Inhale 1-2 puffs into the lungs every 6 (six) hours as needed for wheezing or shortness of breath. 02/25/23   Glenford Bayley, NP  rizatriptan (MAXALT) 10 MG tablet Take 1 tablet (10 mg total) by mouth as needed for migraine. May repeat in 2 hours if needed 04/21/22   Levert Feinstein, MD  Torsemide 40 MG TABS Take 40 mg by  mouth daily. 01/18/23   [provider]   Current Facility-Administered Medications  Medication Dose Route Frequency Provider Last Rate Last Admin   acetaminophen (TYLENOL) tablet 650 mg  650 mg Oral Q6H PRN Adefeso, Oladapo, DO       Or   acetaminophen (TYLENOL) suppository 650 mg  650 mg Rectal Q6H PRN Adefeso, Oladapo, DO       albuterol (PROVENTIL) (2.5 MG/3ML) 0.083% nebulizer solution 2.5 mg  2.5 mg Nebulization Q6H PRN Adefeso, Oladapo, DO       aspirin EC tablet 81 mg  81 mg Oral QHS Adefeso, Oladapo, DO       carvedilol (COREG) tablet 12.5 mg  12.5 mg Oral BID WC Adefeso, Oladapo, DO   12.5 mg at 03/29/23 0919   Chlorhexidine Gluconate Cloth 2 % PADS 6 each  6 each Topical Daily Adefeso, Oladapo, DO   6 each at 03/29/23 0919   clopidogrel (PLAVIX) tablet 75 mg  75 mg Oral Daily Adefeso, Oladapo, DO   75 mg at 03/29/23 0919   dextrose 5 % in lactated ringers infusion   Intravenous Continuous Adefeso, Oladapo, DO 125 mL/hr at 03/29/23 0823 New Bag at 03/29/23 0823   dextrose 50 % solution 0-50 mL  0-50 mL Intravenous PRN Adefeso, Oladapo, DO       feeding supplement (GLUCERNA SHAKE) (GLUCERNA SHAKE) liquid 237 mL  237 mL Oral TID BM Adefeso, Oladapo, DO       folic acid (FOLVITE) tablet 1 mg  1 mg Oral Daily Tat, David, MD   1 mg at 03/29/23 0926   heparin injection 5,000 Units  5,000 Units Subcutaneous Q8H Adefeso, Oladapo, DO   5,000 Units at 03/29/23 0526   insulin aspart (novoLOG) injection 0-9 Units  0-9 Units Subcutaneous Q4H Tat, Onalee Hua, MD       insulin glargine-yfgn  (SEMGLEE) injection 35 Units  35 Units Subcutaneous Daily Tat, David, MD       insulin regular, human (MYXREDLIN) 100 units/ 100 mL infusion   Intravenous Continuous Adefeso, Oladapo, DO 3.2 mL/hr at 03/29/23 0526 3.2 Units/hr at 03/29/23 0526   levothyroxine (SYNTHROID) tablet 175 mcg  175 mcg Oral Q0600 Adefeso, Oladapo, DO   175 mcg at 03/29/23 0526   nicardipine (CARDENE) 20mg  in 0.86% saline IV infusion (0.1 mg/ml)  3-15 mg/hr Intravenous Continuous Adefeso, Oladapo, DO 50 mL/hr at 03/29/23 0343 5 mg/hr at 03/29/23 0343   ondansetron (ZOFRAN) tablet 4 mg  4 mg Oral Q6H PRN Adefeso, Oladapo, DO       Or   ondansetron (ZOFRAN) injection 4 mg  4 mg Intravenous Q6H PRN Adefeso, Oladapo, DO       Oral care mouth rinse  15 mL Mouth Rinse PRN Adefeso, Oladapo, DO       pravastatin (PRAVACHOL) tablet 80 mg  80 mg Oral q1800 Catarina Hartshorn, MD       Labs: Basic Metabolic Panel: Recent Labs  Lab 03/28/23 1825 03/29/23 0318 03/29/23 0319 03/29/23 0738  NA 130*  --  129* 130*  K 4.8  --  4.7 4.1  CL 95*  --  96* 96*  CO2 22  --  23 23  GLUCOSE 490*  --  210* 225*  BUN 37*  --  37* 39*  CREATININE 3.21*  --  2.85* 2.98*  CALCIUM 9.7  --  9.7 9.7  PHOS  --  3.3  --   --    Liver Function Tests: Recent Labs  Lab 03/28/23  1825  AST 20  ALT 14  ALKPHOS 104  BILITOT 1.1  PROT 6.6  ALBUMIN 3.3*   No results for input(s): "LIPASE", "AMYLASE" in the last 168 hours. No results for input(s): "AMMONIA" in the last 168 hours. CBC: Recent Labs  Lab 03/28/23 1704 03/28/23 1825 03/29/23 0318  WBC  --  8.3 15.7*  NEUTROABS  --  6.8  --   HGB 10.5* 11.5* 13.2  HCT 31.0* 36.7 40.0  MCV  --  95.8 91.3  PLT  --  205 214   Cardiac Enzymes: No results for input(s): "CKTOTAL", "CKMB", "CKMBINDEX", "TROPONINI" in the last 168 hours. CBG: Recent Labs  Lab 03/29/23 0519 03/29/23 0627 03/29/23 0723 03/29/23 0821 03/29/23 0918  GLUCAP 212* 225* 216* 209* 206*   Iron Studies: No  results for input(s): "IRON", "TIBC", "TRANSFERRIN", "FERRITIN" in the last 72 hours. Studies/Results: DG Chest Portable 1 View  Result Date: 03/28/2023 CLINICAL DATA:  Shortness of breath EXAM: PORTABLE CHEST 1 VIEW COMPARISON:  12/27/2022 FINDINGS: Cardiac shadow is stable. Vascular congestion is noted likely related to volume overload. Dialysis catheter is noted in place. No sizable effusion is seen. No focal infiltrate is noted. IMPRESSION: Vascular congestion likely related to volume overload. No other focal abnormality is noted. Electronically Signed   By: Alcide Clever M.D.   On: 03/28/2023 19:38    ROS: Pertinent items are noted in HPI. Physical Exam: Vitals:   03/29/23 0645 03/29/23 0700 03/29/23 0800 03/29/23 0833  BP: (!) 145/31 (!) 149/34 (!) 161/61   Pulse: 86 85 85 92  Resp: 14 18 (!) 23 (!) 25  Temp:      TempSrc:      SpO2: 100% 100% 100% 95%  Weight:      Height:          Weight change:   Intake/Output Summary (Last 24 hours) at 03/29/2023 1006 Last data filed at 03/29/2023 1914 Gross per 24 hour  Intake 2744.4 ml  Output 2300 ml  Net 444.4 ml   BP (!) 161/61   Pulse 92   Temp 98.3 F (36.8 C) (Oral)   Resp (!) 25   Ht 5\' 9"  (1.753 m)   Wt 119.8 kg   LMP 03/23/2017 (Approximate)   SpO2 95%   BMI 39.00 kg/m  General appearance: no distress and slowed mentation Head: Normocephalic, without obvious abnormality, atraumatic Resp: rales bibasilar Cardio: regular rate and rhythm, S1, S2 normal, no murmur, click, rub or gallop GI: soft, non-tender; bowel sounds normal; no masses,  no organomegaly Extremities: extremities normal, atraumatic, no cyanosis or edema Dialysis Access:  Dialysis Orders: Center: [facility]  on MWF . EDW 117.5kg HD Bath 3K/2.5Ca  Time 4:00 Heparin none. Access TDC BFR 400 DFR 500    No micera or venofer  Assessment/Plan:  DKA - started on insulin drip per primary svc.  Decrease IVF's as she is ESRD and has pulmonary edema and h/o  HFrEF.  ESRD -  for HD today off schedule.  Will eventually get back on her outpatient schedule.  Hypertension/volume  - came in with hypertensive urgency.  UF as tolerated.  Bp improving with nicardipine drip.  Anemia  - Hgb stable at 13.2, no ESA  Metabolic bone disease -   continue with home meds  Nutrition -  renal diet, carb modified   Chronic combined systolic and diastolic CHF - UF with HD as above.   OSA - continue with CPAP.  Irena Cords, MD [facility], [facility] 03/29/2023, 10:06 AM

## 2023-03-30 ENCOUNTER — Inpatient Hospital Stay (HOSPITAL_COMMUNITY): Payer: BC Managed Care – PPO

## 2023-03-30 ENCOUNTER — Ambulatory Visit (HOSPITAL_COMMUNITY): Payer: BC Managed Care – PPO

## 2023-03-30 ENCOUNTER — Encounter: Payer: BC Managed Care – PPO | Admitting: Vascular Surgery

## 2023-03-30 DIAGNOSIS — E039 Hypothyroidism, unspecified: Secondary | ICD-10-CM | POA: Diagnosis not present

## 2023-03-30 DIAGNOSIS — N186 End stage renal disease: Secondary | ICD-10-CM | POA: Diagnosis not present

## 2023-03-30 DIAGNOSIS — I5022 Chronic systolic (congestive) heart failure: Secondary | ICD-10-CM | POA: Diagnosis not present

## 2023-03-30 DIAGNOSIS — E0810 Diabetes mellitus due to underlying condition with ketoacidosis without coma: Secondary | ICD-10-CM | POA: Diagnosis not present

## 2023-03-30 LAB — CBC
HCT: 34.1 % — ABNORMAL LOW (ref 36.0–46.0)
Hemoglobin: 11.3 g/dL — ABNORMAL LOW (ref 12.0–15.0)
MCH: 30.7 pg (ref 26.0–34.0)
MCHC: 33.1 g/dL (ref 30.0–36.0)
MCV: 92.7 fL (ref 80.0–100.0)
Platelets: 318 10*3/uL (ref 150–400)
RBC: 3.68 MIL/uL — ABNORMAL LOW (ref 3.87–5.11)
RDW: 13.7 % (ref 11.5–15.5)
WBC: 17.3 10*3/uL — ABNORMAL HIGH (ref 4.0–10.5)
nRBC: 0 % (ref 0.0–0.2)

## 2023-03-30 LAB — URINE CULTURE: Culture: NO GROWTH

## 2023-03-30 LAB — RENAL FUNCTION PANEL
Albumin: 3.2 g/dL — ABNORMAL LOW (ref 3.5–5.0)
Anion gap: 11 (ref 5–15)
BUN: 27 mg/dL — ABNORMAL HIGH (ref 6–20)
CO2: 24 mmol/L (ref 22–32)
Calcium: 9.3 mg/dL (ref 8.9–10.3)
Chloride: 99 mmol/L (ref 98–111)
Creatinine, Ser: 2.93 mg/dL — ABNORMAL HIGH (ref 0.44–1.00)
GFR, Estimated: 19 mL/min — ABNORMAL LOW (ref >60)
Glucose, Bld: 228 mg/dL — ABNORMAL HIGH (ref 70–99)
Phosphorus: 2.8 mg/dL (ref 2.5–4.6)
Potassium: 3.8 mmol/L (ref 3.5–5.1)
Sodium: 134 mmol/L — ABNORMAL LOW (ref 135–145)

## 2023-03-30 LAB — GLUCOSE, CAPILLARY
Glucose-Capillary: 115 mg/dL — ABNORMAL HIGH (ref 70–99)
Glucose-Capillary: 217 mg/dL — ABNORMAL HIGH (ref 70–99)
Glucose-Capillary: 225 mg/dL — ABNORMAL HIGH (ref 70–99)
Glucose-Capillary: 230 mg/dL — ABNORMAL HIGH (ref 70–99)
Glucose-Capillary: 331 mg/dL — ABNORMAL HIGH (ref 70–99)
Glucose-Capillary: 87 mg/dL (ref 70–99)

## 2023-03-30 MED ORDER — TORSEMIDE 20 MG PO TABS
40.0000 mg | ORAL_TABLET | Freq: Two times a day (BID) | ORAL | Status: DC
  Administered 2023-03-30 – 2023-03-31 (×3): 40 mg via ORAL
  Filled 2023-03-30 (×3): qty 2

## 2023-03-30 MED ORDER — INSULIN PUMP
Freq: Three times a day (TID) | SUBCUTANEOUS | Status: DC
  Filled 2023-03-30: qty 1

## 2023-03-30 MED ORDER — BUPROPION HCL ER (XL) 300 MG PO TB24
300.0000 mg | ORAL_TABLET | Freq: Every day | ORAL | Status: DC
  Administered 2023-03-30 – 2023-03-31 (×2): 300 mg via ORAL
  Filled 2023-03-30: qty 1
  Filled 2023-03-30: qty 2

## 2023-03-30 MED ORDER — MELATONIN 3 MG PO TABS
9.0000 mg | ORAL_TABLET | Freq: Every day | ORAL | Status: DC
  Administered 2023-03-30: 9 mg via ORAL
  Filled 2023-03-30: qty 3

## 2023-03-30 MED ORDER — LAMOTRIGINE 100 MG PO TABS
200.0000 mg | ORAL_TABLET | Freq: Every day | ORAL | Status: DC
  Administered 2023-03-30 – 2023-03-31 (×2): 200 mg via ORAL
  Filled 2023-03-30 (×2): qty 2

## 2023-03-30 MED ORDER — LORAZEPAM 0.5 MG PO TABS
0.5000 mg | ORAL_TABLET | Freq: Every day | ORAL | Status: DC | PRN
  Administered 2023-03-31: 0.5 mg via ORAL
  Filled 2023-03-30: qty 1

## 2023-03-30 MED ORDER — HYDRALAZINE HCL 10 MG PO TABS
10.0000 mg | ORAL_TABLET | Freq: Three times a day (TID) | ORAL | Status: DC
  Administered 2023-03-30 – 2023-03-31 (×4): 10 mg via ORAL
  Filled 2023-03-30 (×4): qty 1

## 2023-03-30 NOTE — Progress Notes (Signed)
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  The patient states that her Dexcom glucose monitor was not working properly secondary to low battery.  There was also a malfunction with her insulin pump.  She checked her blood sugars and it was 600.  She tried giving herself some subcutaneous insulin without much improvement.  As result, she presented for further evaluation treatment. At the time of my evaluation in the morning of 03/29/2023, the patient remains somnolent.  She falls asleep without any stimulation.  She states that her CGM and insulin pump were not working properly.  She unable to clarify exactly what is going on with the insulin pump and feels that the battery and transmitter to his CGM is not working properly.  She endorses compliance with dialysis.  Last dialysis was on 10/19/20224.  She denies any fevers, chills, chest pain, shortness breath, cough, hemoptysis, abdominal pain.  She feels nauseous.  There is no diarrhea.  Notably, the patient had a recent hospital admission to [facility] from 12/27/2022 to 01/17/2023.  During that hospital admission, the patient was admitted for acute respiratory failure secondary to volume overload resulting from heart failure.  Echocardiogram at that time showed EF of 35-40%, grade 1 DD, mild MR, mild pulmonary hypertension.  Repeat echocardiogram was done during hospitalization showed EF of 45% with apical wall motion abnormalities and thinning and akinesis.  Cardiology was consulted and nuclear medicine stress test on 01/05/2023 showed no significant ischemia but did show a large moderately severe fixed perfusion  deficit involving the apical segment consistent with possible LAD disease versus stress-induced cardiomyopathy.  Given the patient's CKD, cardiology recommended medical management and outpatient follow-up.  The patient was started on clopidogrel and continued on her home carvedilol and statin.  She was discharged on her usual home nighttime oxygen.  The patient was initially diuresed with furosemide and metolazone.  Ultimately, decision was made to initiate the patient on HD.  She had a right-sided tunneled catheter placed and HD was initiated on 01/13/2023.    Assessment/Plan:  DKA (diabetic ketoacidosis) - Treated and Resolved  -patient started on IV insulin  -Electrolytes were monitored and repleted -transitioned back to insulin pump on 10/23.  Follow closely per insulin pump orderset.  -advance diet as tolerated -12/2022 A1C--6.6  Leukemoid reaction -Check PCT - 7.54, recheck in AM  -Blood cultures x 2 sets -Personally reviewed chest x-ray--vascular congestion, no consolidation   Acute metabolic encephalopathy - RESOLVED  -pt seems back to baseline, no further confusion  ESRD -Patient dialyzes on Tuesday, Thursday, Saturday -Nephrology consulted for maintenance dialysis -She has residual renal function and continues on torsemide 40 mg daily -HD received 10/22, 10/23   HTN Urgency -continue carvedilol -hydralazine for non HD days  -weaned cardene infusion  Bipolar II disorder  Resume ativan Resume home cymbalta and lamictal temporarily   Mixed hyperlipidemia Continue statin   Chronic migraine w/o aura w/o status migrainosus, not intractable Holding Maxalt temporarily secondary to elevated blood pressure   Class 2 obesity BMI 39.00 Lifestyle modification   Hypothyroidism continue synthroid to 175 mcg daily   Hyponatremia Secondary to volume depletion and hyperglycemia  OSA -continue CPAP  History of RA -Diagnosed 2021, patient reports she is on disability for  this and previously followed with rheum but stopped as the medications were not helping   Family Communication: patient updated at bedside  Consultants:  renal  Code Status:  FULL   DVT Prophylaxis:  [place] Heparin    Procedures: As Listed in Progress Note Above  Antibiotics: None   Subjective: Pt reports that she is feeling better but poor appetite. No emesis.   Objective: Vitals:   03/30/23 1215 03/30/23 1300 03/30/23 1306 03/30/23 1338  BP:  (!) 176/79 (!) 176/79 130/63  Pulse: 83 80  80  Resp: 20 20  20   Temp:    (!) 96.6 F (35.9 C)  TempSrc:    Oral  SpO2: 95% 96%  96%  Weight:      Height:        Intake/Output Summary (Last 24 hours) at 03/30/2023 1559 Last data filed at 03/30/2023 0622 Gross per 24 hour  Intake 1428.49 ml  Output 150 ml  Net 1278.49 ml   Weight change: 1.5 kg Exam:  General:  awake, alert, cooperative, NAD.  HEENT: NCAT, neck supple, no JVD;  Cardiovascular: normal s1, s2 sounds, no HSM.  Respiratory: BBS clear to auscultation.  Abdomen: ND/NT, no HSM; normal BS heard.  Extremities: no c/c/E   Data Reviewed: I have personally reviewed following labs and imaging studies Basic Metabolic Panel: Recent Labs  Lab 03/29/23 0318 03/29/23 0319 03/29/23 0738 03/29/23 1025 03/29/23 1452 03/30/23 0511  NA  --  129* 130* 131* 133* 134*  K  --  4.7 4.1 4.2 4.0 3.8  CL  --  96* 96* 97* 96* 99  CO2  --  23 23 21* 25 24  GLUCOSE  --  210* 225* 218* 240* 228*  BUN  --  37* 39* 39* 20 27*  CREATININE  --  2.85* 2.98* 3.10* 1.75* 2.93*  CALCIUM  --  9.7 9.7 9.8 8.8* 9.3  MG 2.0  --   --   --   --   --   PHOS 3.3  --   --   --   --  2.8   Liver Function Tests: Recent Labs  Lab 03/28/23 1825 03/30/23 0511  AST 20  --   ALT 14  --   ALKPHOS 104  --   BILITOT 1.1  --   PROT 6.6  --   ALBUMIN 3.3* 3.2*   No results for input(s): "LIPASE", "AMYLASE" in the last 168 hours. Recent Labs  Lab 03/29/23 1025  AMMONIA 27   Coagulation  Profile: No results for input(s): "INR", "PROTIME" in the last 168 hours. CBC: Recent Labs  Lab 03/28/23 1704 03/28/23 1825 03/29/23 0318 03/30/23 0511  WBC  --  8.3 15.7* 17.3*  NEUTROABS  --  6.8  --   --   HGB 10.5* 11.5* 13.2 11.3*  HCT 31.0* 36.7 40.0 34.1*  MCV  --  95.8 91.3 92.7  PLT  --  205 214 318   Cardiac Enzymes: No results for input(s): "CKTOTAL", "CKMB", "CKMBINDEX", "TROPONINI" in the last 168 hours. BNP: Invalid input(s): "POCBNP" CBG: Recent Labs  Lab 03/29/23 2152 03/30/23 0020 03/30/23 0418 03/30/23 0711 03/30/23 1121  GLUCAP 388* 331* 217* 225* 230*   HbA1C: Recent Labs    03/28/23 1825  HGBA1C 5.6   Urine analysis:    Component Value Date/Time   COLORURINE AMBER (A) 03/29/2023 0931   APPEARANCEUR CLOUDY (A) 03/29/2023 9147  LABSPEC 1.026 03/29/2023 0931   PHURINE 5.0 03/29/2023 0931   GLUCOSEU 150 (A) 03/29/2023 0931   HGBUR SMALL (A) 03/29/2023 0931   BILIRUBINUR NEGATIVE 03/29/2023 0931   KETONESUR 5 (A) 03/29/2023 0931   PROTEINUR >=300 (A) 03/29/2023 0931   UROBILINOGEN 0.2 03/19/2015 1525   NITRITE NEGATIVE 03/29/2023 0931   LEUKOCYTESUR NEGATIVE 03/29/2023 0931    Recent Results (from the past 240 hour(s))  MRSA Next Gen by PCR, Nasal     Status: None   Collection Time: 03/29/23 12:35 AM   Specimen: Nasal Mucosa; Nasal Swab  Result Value Ref Range Status   MRSA by PCR Next Gen NOT DETECTED NOT DETECTED Final    Comment: (NOTE) The GeneXpert MRSA Assay (FDA approved for NASAL specimens only), is one component of a comprehensive MRSA colonization surveillance program. It is not intended to diagnose MRSA infection nor to guide or monitor treatment for MRSA infections. Test performance is not FDA approved in patients less than 68 years old. Performed at [facility], [address] [place], [place] [address]   Culture, blood (Routine X 2) w Reflex to ID Panel     Status: None (Preliminary result)   Collection Time: 03/29/23  10:19 AM   Specimen: BLOOD  Result Value Ref Range Status   Specimen Description BLOOD BLOOD RIGHT ARM ac  Final   Special Requests   Final    BOTTLES DRAWN AEROBIC AND ANAEROBIC Blood Culture adequate volume   Culture   Final    NO GROWTH < 24 HOURS Performed at [facility], [address], [place], [place] [address]    Report Status PENDING  Incomplete  Culture, blood (Routine X 2) w Reflex to ID Panel     Status: None (Preliminary result)   Collection Time: 03/29/23 10:25 AM   Specimen: BLOOD  Result Value Ref Range Status   Specimen Description BLOOD BLOOD LEFT ARM wrist  Final   Special Requests   Final    BOTTLES DRAWN AEROBIC ONLY Blood Culture adequate volume   Culture   Final    NO GROWTH < 24 HOURS Performed at [facility], [address], [place], [place] [address]    Report Status PENDING  Incomplete     Scheduled Meds:  aspirin EC  81 mg Oral QHS   buPROPion  300 mg Oral Daily   carvedilol  12.5 mg Oral BID WC   Chlorhexidine Gluconate Cloth  6 each Topical Daily   Chlorhexidine Gluconate Cloth  6 each Topical Q0600   clopidogrel  75 mg Oral Daily   doxycycline  100 mg Oral Q12H   feeding supplement (GLUCERNA SHAKE)  237 mL Oral TID BM   folic acid  1 mg Oral Daily   heparin  5,000 Units Subcutaneous Q8H   hydrALAZINE  10 mg Oral Q8H   insulin pump   Subcutaneous TID WC, HS, 0200   lamoTRIgine  200 mg Oral Daily   levothyroxine  175 mcg Oral Q0600   melatonin  9 mg Oral QHS   pravastatin  80 mg Oral q1800   torsemide  40 mg Oral BID   Continuous Infusions:  cefTRIAXone (ROCEPHIN)  IV 2 g (03/29/23 2020)    Procedures/Studies: DG CHEST PORT 1 VIEW  Result Date: 03/30/2023 CLINICAL DATA:  Shortness of breath. EXAM: PORTABLE CHEST 1 VIEW COMPARISON:  Chest radiograph 03/28/2023 FINDINGS: The cardiomediastinal silhouette is within normal limits. A right jugular dialysis catheter remains in place. Lung volumes are low with  similar appearance of pulmonary  vascular congestion. No confluent airspace opacity, sizeable pleural effusion, or pneumothorax is identified. No acute osseous abnormality is seen. IMPRESSION: Low lung volumes with pulmonary vascular congestion. Electronically Signed   By: Sebastian Ache M.D.   On: 03/30/2023 14:50   CT HEAD WO CONTRAST ( )  Result Date: 03/30/2023 CLINICAL DATA:  52 year old female with hyperglycemia. Altered mental status. EXAM: CT HEAD WITHOUT CONTRAST TECHNIQUE: Contiguous axial images were obtained from the base of the skull through the vertex without intravenous contrast. RADIATION DOSE REDUCTION: This exam was performed according to the departmental dose-optimization program which includes automated exposure control, adjustment of the mA and/or kV according to patient size and/or use of iterative reconstruction technique. COMPARISON:  Brain MRI 03/31/2022.  Head CT 09/10/2021. FINDINGS: Brain: Stable cerebral volume. No midline shift, ventriculomegaly, mass effect, evidence of mass lesion, intracranial hemorrhage or evidence of cortically based acute infarction. Chronic patchy periventricular white matter hypodensity is moderate for age, stable from the MRI last year. Otherwise maintained gray-white differentiation. No cortical encephalomalacia identified. Vascular: No suspicious intracranial vascular hyperdensity. Calcified atherosclerosis at the skull base. Skull: No acute osseous abnormality identified. Sinuses/Orbits: Visualized paranasal sinuses and mastoids are stable and well aerated. Other: No acute orbit or scalp soft tissue finding. Postoperative changes to both globes since last year. IMPRESSION: 1. No acute intracranial abnormality. 2. Moderate for age chronic white matter changes are stable, most commonly due to small vessel disease. Electronically Signed   By: Odessa Fleming M.D.   On: 03/30/2023 04:58   DG Chest Portable 1 View  Result Date: 03/28/2023 CLINICAL DATA:  Shortness of breath EXAM: PORTABLE  CHEST 1 VIEW COMPARISON:  12/27/2022 FINDINGS: Cardiac shadow is stable. Vascular congestion is noted likely related to volume overload. Dialysis catheter is noted in place. No sizable effusion is seen. No focal infiltrate is noted. IMPRESSION: Vascular congestion likely related to volume overload. No other focal abnormality is noted. Electronically Signed   By: Alcide Clever M.D.   On: 03/28/2023 19:38    Angelise Petrich Laural Benes, MD  [facility]  How to contact the [facility] Attending or Consulting provider 7A - 7P or covering provider during after hours 7P -7A, for this patient?  Check the care team in [facility] and look for a) attending/consulting TR[facility] provider listed and b) the [facility] team listed Log into [email or web address] and use [place]'s universal password to access. If you do not have the password, please contact the hospital operator. Locate the [facility] provider you are looking for under [facility] and page to a number that you can be directly reached. If you still have difficulty reaching the provider, please page the [facility] (Director on Call) for the Hospitalists listed on amion for assistance.  03/30/2023, 3:59 PM   LOS: 1 day

## 2023-03-30 NOTE — Progress Notes (Signed)
Received report from Aura Fey RN. Pt transferred from ICU at 1300. Pt has been resting with visitor at bedside. Nad. Requested maalox earlier and was given which helped "a little" per pt. Pt cpap placed at 1840 per pt request. Pt ambulatory to bathroom.

## 2023-03-30 NOTE — Inpatient Diabetes Management (Addendum)
Inpatient Diabetes Program Recommendations  AACE/ADA: New Consensus Statement on Inpatient Glycemic Control   Target Ranges:  Prepandial:   less than 140 mg/dL      Peak postprandial:   less than 180 mg/dL (1-2 hours)      Critically ill patients:  140 - 180 mg/dL    Latest Reference Range & Units 03/30/23 00:20 03/30/23 04:18 03/30/23 07:11  Glucose-Capillary 70 - 99 mg/dL 102 (H) 725 (H) 366 (H)    Latest Reference Range & Units 03/29/23 09:18 03/29/23 10:13 03/29/23 11:29 03/29/23 12:29 03/29/23 16:04 03/29/23 21:52  Glucose-Capillary 70 - 99 mg/dL 440 (H) 347 (H) 425 (H) 164 (H) 305 (H) 388 (H)   Review of Glycemic Control  Diabetes history: DM1 (does not make any insulin; requires basal, correction, and carbohydrate coverage) Outpatient Diabetes medications: Levemir 20 units daily (if not on pump), Novolog via T-slim insulin pump, Dexcom G6 CGM Current orders for Inpatient glycemic control: Semglee 35 units daily, Novolog 0-9 units Q4H  Inpatient Diabetes Program Recommendations:    Insulin: If patient is not transitioned to her insulin pump today, please consider increasing Semglee to 45 units daily and adding Novolog 4 units TID with meals for meal coverage.  Insulin Pump: Pt's husband is to bring insulin pump supplies to the hospital this morning. If patient is allowed to resume her insulin pump please discontinue Semglee and Novolog orders and use Insulin Pump order set to order CBGs Q4H and Insulin Pump Q4H.  NOTE: Spoke with patient over the phone regarding DM control and insulin pump. Patient states that she does not have her pump supplies yet but her husband is bring them to the hospital this morning. Patient states that she did not eat much yesterday (some grapes and peanut butter crakers) due to nausea and acid reflux. Patient reports that her Dexcom G6 CGM is working and showing her current glucose is 304 mg/dl. Patient states she has only drank water this morning. Patient  reports she is feeling better today and she feels up to resuming her insulin pump if okay with attending provider. Discussed glucose trends and explained that if pump is not resumed today then SQ insulin will need to be increased. Explained that when attending provider rounds today he should discuss plan regarding DM control and he will let her know if he wants her to resume her insulin pump today or not.  Thanks, Orlando Penner, RN, MSN, CDCES Diabetes Coordinator Inpatient Diabetes Program [phone] (Team Pager from 8am to 5pm)

## 2023-03-30 NOTE — Progress Notes (Signed)
Patient ID: Jasmine [name], female   DOB: 1971/04/10, 52 y.o.   MRN: [id number] S: Feeling better this morning O:BP 106/67   Pulse 86   Temp 98.5 F (36.9 C) (Oral)   Resp 19   Ht 5\' 9"  (1.753 m)   Wt 118.1 kg   LMP 03/23/2017 (Approximate)   SpO2 96%   BMI 38.45 kg/m   Intake/Output Summary (Last 24 hours) at 03/30/2023 1124 Last data filed at 03/30/2023 0622 Gross per 24 hour  Intake 1428.49 ml  Output 2450 ml  Net -1021.51 ml   Intake/Output: I/O last 3 completed shifts: In: 4172.9 [P.O.:240; I.V.:1364.9; IV Piggyback:2568] Out: 4750 [Urine:2450; Other:2300]  Intake/Output this shift:  No intake/output data recorded. Weight change: 1.5 kg Gen:NAD CVS: RRR Resp:CTA Abd: +BS, soft, NT/ND Ext: no edema  Recent Labs  Lab 03/28/23 1704 03/28/23 1825 03/29/23 0318 03/29/23 0319 03/29/23 0738 03/29/23 1025 03/29/23 1452 03/30/23 0511  NA 129* 130*  --  129* 130* 131* 133* 134*  K 4.7 4.8  --  4.7 4.1 4.2 4.0 3.8  CL 93* 95*  --  96* 96* 97* 96* 99  CO2  --  22  --  23 23 21* 25 24  GLUCOSE 598* 490*  --  210* 225* 218* 240* 228*  BUN 34* 37*  --  37* 39* 39* 20 27*  CREATININE 3.50* 3.21*  --  2.85* 2.98* 3.10* 1.75* 2.93*  ALBUMIN  --  3.3*  --   --   --   --   --  3.2*  CALCIUM  --  9.7  --  9.7 9.7 9.8 8.8* 9.3  PHOS  --   --  3.3  --   --   --   --  2.8  AST  --  20  --   --   --   --   --   --   ALT  --  14  --   --   --   --   --   --    Liver Function Tests: Recent Labs  Lab 03/28/23 1825 03/30/23 0511  AST 20  --   ALT 14  --   ALKPHOS 104  --   BILITOT 1.1  --   PROT 6.6  --   ALBUMIN 3.3* 3.2*   No results for input(s): "LIPASE", "AMYLASE" in the last 168 hours. Recent Labs  Lab 03/29/23 1025  AMMONIA 27   CBC: Recent Labs  Lab 03/28/23 1825 03/29/23 0318 03/30/23 0511  WBC 8.3 15.7* 17.3*  NEUTROABS 6.8  --   --   HGB 11.5* 13.2 11.3*  HCT 36.7 40.0 34.1*  MCV 95.8 91.3 92.7  PLT 205 214 318   Cardiac Enzymes: No results  for input(s): "CKTOTAL", "CKMB", "CKMBINDEX", "TROPONINI" in the last 168 hours. CBG: Recent Labs  Lab 03/29/23 1604 03/29/23 2152 03/30/23 0020 03/30/23 0418 03/30/23 0711  GLUCAP 305* 388* 331* 217* 225*    Iron Studies: No results for input(s): "IRON", "TIBC", "TRANSFERRIN", "FERRITIN" in the last 72 hours. Studies/Results: CT HEAD WO CONTRAST ( )  Result Date: 03/30/2023 CLINICAL DATA:  52 year old female with hyperglycemia. Altered mental status. EXAM: CT HEAD WITHOUT CONTRAST TECHNIQUE: Contiguous axial images were obtained from the base of the skull through the vertex without intravenous contrast. RADIATION DOSE REDUCTION: This exam was performed according to the departmental dose-optimization program which includes automated exposure control, adjustment of the mA and/or kV according to patient size and/or use of  iterative reconstruction technique. COMPARISON:  Brain MRI 03/31/2022.  Head CT 09/10/2021. FINDINGS: Brain: Stable cerebral volume. No midline shift, ventriculomegaly, mass effect, evidence of mass lesion, intracranial hemorrhage or evidence of cortically based acute infarction. Chronic patchy periventricular white matter hypodensity is moderate for age, stable from the MRI last year. Otherwise maintained gray-white differentiation. No cortical encephalomalacia identified. Vascular: No suspicious intracranial vascular hyperdensity. Calcified atherosclerosis at the skull base. Skull: No acute osseous abnormality identified. Sinuses/Orbits: Visualized paranasal sinuses and mastoids are stable and well aerated. Other: No acute orbit or scalp soft tissue finding. Postoperative changes to both globes since last year. IMPRESSION: 1. No acute intracranial abnormality. 2. Moderate for age chronic white matter changes are stable, most commonly due to small vessel disease. Electronically Signed   By: Odessa Fleming M.D.   On: 03/30/2023 04:58   DG Chest Portable 1 View  Result Date:  03/28/2023 CLINICAL DATA:  Shortness of breath EXAM: PORTABLE CHEST 1 VIEW COMPARISON:  12/27/2022 FINDINGS: Cardiac shadow is stable. Vascular congestion is noted likely related to volume overload. Dialysis catheter is noted in place. No sizable effusion is seen. No focal infiltrate is noted. IMPRESSION: Vascular congestion likely related to volume overload. No other focal abnormality is noted. Electronically Signed   By: Alcide Clever M.D.   On: 03/28/2023 19:38    aspirin EC  81 mg Oral QHS   buPROPion  300 mg Oral Daily   carvedilol  12.5 mg Oral BID WC   Chlorhexidine Gluconate Cloth  6 each Topical Daily   Chlorhexidine Gluconate Cloth  6 each Topical Q0600   clopidogrel  75 mg Oral Daily   doxycycline  100 mg Oral Q12H   feeding supplement (GLUCERNA SHAKE)  237 mL Oral TID BM   folic acid  1 mg Oral Daily   heparin  5,000 Units Subcutaneous Q8H   hydrALAZINE  10 mg Oral Q8H   insulin aspart  0-9 Units Subcutaneous Q4H   insulin pump   Subcutaneous TID WC, HS, 0200   lamoTRIgine  200 mg Oral Daily   levothyroxine  175 mcg Oral Q0600   melatonin  9 mg Oral QHS   pravastatin  80 mg Oral q1800   torsemide  40 mg Oral BID    BMET    Component Value Date/Time   NA 134 (L) 03/30/2023 0511   K 3.8 03/30/2023 0511   CL 99 03/30/2023 0511   CO2 24 03/30/2023 0511   GLUCOSE 228 (H) 03/30/2023 0511   BUN 27 (H) 03/30/2023 0511   CREATININE 2.93 (H) 03/30/2023 0511   CALCIUM 9.3 03/30/2023 0511   GFRNONAA 19 (L) 03/30/2023 0511   GFRAA 31 (L) 09/12/2019 0413   CBC    Component Value Date/Time   WBC 17.3 (H) 03/30/2023 0511   RBC 3.68 (L) 03/30/2023 0511   HGB 11.3 (L) 03/30/2023 0511   HCT 34.1 (L) 03/30/2023 0511   PLT 318 03/30/2023 0511   MCV 92.7 03/30/2023 0511   MCH 30.7 03/30/2023 0511   MCHC 33.1 03/30/2023 0511   RDW 13.7 03/30/2023 0511   LYMPHSABS 0.9 03/28/2023 1825   MONOABS 0.4 03/28/2023 1825   EOSABS 0.0 03/28/2023 1825   BASOSABS 0.1 03/28/2023 1825       Dialysis Orders: Center: [facility]  on MWF . EDW 117.5kg HD Bath 3K/2.5Ca  Time 4:00 Heparin none. Access TDC BFR 400 DFR 500    No micera or venofer   Assessment/Plan:  DKA - improved off  of insulin drip per primary svc.  No IVF's as she is ESRD and has pulmonary edema and h/o HFrEF.  ESRD -  for HD today off schedule.  Will eventually get back on her outpatient schedule and may be able to wait until Friday pending labs and bp.  Will re-evaluate tomorrow morning.  Hypertension/volume  - came in with hypertensive urgency.  UF as tolerated.  Bp improving UF and po meds.  Off of cardene drip.  Anemia  - Hgb stable at 11.3, no ESA  Metabolic bone disease -   continue with home meds  Nutrition -  renal diet, carb modified   Chronic combined systolic and diastolic CHF - UF with HD as above.   OSA - continue with CPAP.  Irena Cords, MD [facility]

## 2023-03-30 NOTE — Plan of Care (Signed)
  Problem: Education: Goal: Knowledge of General Education information will improve Description: Including pain rating scale, medication(s)/side effects and non-pharmacologic comfort measures Outcome: Progressing   Problem: Health Behavior/Discharge Planning: Goal: Ability to manage health-related needs will improve Outcome: Progressing   Problem: Clinical Measurements: Goal: Ability to maintain clinical measurements within normal limits will improve Outcome: Progressing Goal: Will remain free from infection Outcome: Progressing Goal: Diagnostic test results will improve Outcome: Progressing Goal: Respiratory complications will improve Outcome: Progressing Goal: Cardiovascular complication will be avoided Outcome: Progressing   Problem: Activity: Goal: Risk for activity intolerance will decrease Outcome: Progressing   Problem: Nutrition: Goal: Adequate nutrition will be maintained Outcome: Progressing   Problem: Coping: Goal: Level of anxiety will decrease Outcome: Progressing   Problem: Elimination: Goal: Will not experience complications related to bowel motility Outcome: Progressing Goal: Will not experience complications related to urinary retention Outcome: Progressing   Problem: Pain Managment: Goal: General experience of comfort will improve Outcome: Progressing   Problem: Safety: Goal: Ability to remain free from injury will improve Outcome: Progressing   Problem: Skin Integrity: Goal: Risk for impaired skin integrity will decrease Outcome: Progressing   Problem: Education: Goal: Ability to describe self-care measures that may prevent or decrease complications (Diabetes Survival Skills Education) will improve Outcome: Progressing Goal: Individualized Educational Video(s) Outcome: Progressing   Problem: Coping: Goal: Ability to adjust to condition or change in health will improve Outcome: Progressing   Problem: Fluid Volume: Goal: Ability to  maintain a balanced intake and output will improve Outcome: Progressing   Problem: Health Behavior/Discharge Planning: Goal: Ability to identify and utilize available resources and services will improve Outcome: Progressing Goal: Ability to manage health-related needs will improve Outcome: Progressing   Problem: Metabolic: Goal: Ability to maintain appropriate glucose levels will improve Outcome: Progressing   Problem: Nutritional: Goal: Maintenance of adequate nutrition will improve Outcome: Progressing Goal: Progress toward achieving an optimal weight will improve Outcome: Progressing   Problem: Skin Integrity: Goal: Risk for impaired skin integrity will decrease Outcome: Progressing   Problem: Tissue Perfusion: Goal: Adequacy of tissue perfusion will improve Outcome: Progressing   Problem: Education: Goal: Ability to describe self-care measures that may prevent or decrease complications (Diabetes Survival Skills Education) will improve Outcome: Progressing Goal: Individualized Educational Video(s) Outcome: Progressing   Problem: Cardiac: Goal: Ability to maintain an adequate cardiac output will improve Outcome: Progressing

## 2023-03-31 ENCOUNTER — Encounter (HOSPITAL_COMMUNITY): Payer: Self-pay | Admitting: Internal Medicine

## 2023-03-31 DIAGNOSIS — I5022 Chronic systolic (congestive) heart failure: Secondary | ICD-10-CM | POA: Diagnosis not present

## 2023-03-31 DIAGNOSIS — G4733 Obstructive sleep apnea (adult) (pediatric): Secondary | ICD-10-CM | POA: Diagnosis not present

## 2023-03-31 DIAGNOSIS — E1010 Type 1 diabetes mellitus with ketoacidosis without coma: Secondary | ICD-10-CM | POA: Diagnosis not present

## 2023-03-31 DIAGNOSIS — N186 End stage renal disease: Secondary | ICD-10-CM | POA: Diagnosis not present

## 2023-03-31 LAB — BASIC METABOLIC PANEL
Anion gap: 7 (ref 5–15)
BUN: 31 mg/dL — ABNORMAL HIGH (ref 6–20)
CO2: 29 mmol/L (ref 22–32)
Calcium: 8.9 mg/dL (ref 8.9–10.3)
Chloride: 99 mmol/L (ref 98–111)
Creatinine, Ser: 3.61 mg/dL — ABNORMAL HIGH (ref 0.44–1.00)
GFR, Estimated: 15 mL/min — ABNORMAL LOW (ref >60)
Glucose, Bld: 87 mg/dL (ref 70–99)
Potassium: 3.5 mmol/L (ref 3.5–5.1)
Sodium: 135 mmol/L (ref 135–145)

## 2023-03-31 LAB — CBC WITH DIFFERENTIAL/PLATELET
Abs Immature Granulocytes: 0.03 10*3/uL (ref 0.00–0.07)
Basophils Absolute: 0.1 10*3/uL (ref 0.0–0.1)
Basophils Relative: 1 %
Eosinophils Absolute: 0 10*3/uL (ref 0.0–0.5)
Eosinophils Relative: 0 %
HCT: 32.3 % — ABNORMAL LOW (ref 36.0–46.0)
Hemoglobin: 10.1 g/dL — ABNORMAL LOW (ref 12.0–15.0)
Immature Granulocytes: 0 %
Lymphocytes Relative: 15 %
Lymphs Abs: 1.8 10*3/uL (ref 0.7–4.0)
MCH: 29.9 pg (ref 26.0–34.0)
MCHC: 31.3 g/dL (ref 30.0–36.0)
MCV: 95.6 fL (ref 80.0–100.0)
Monocytes Absolute: 0.9 10*3/uL (ref 0.1–1.0)
Monocytes Relative: 8 %
Neutro Abs: 9.3 10*3/uL — ABNORMAL HIGH (ref 1.7–7.7)
Neutrophils Relative %: 76 %
Platelets: 229 10*3/uL (ref 150–400)
RBC: 3.38 MIL/uL — ABNORMAL LOW (ref 3.87–5.11)
RDW: 13.5 % (ref 11.5–15.5)
WBC: 12.1 10*3/uL — ABNORMAL HIGH (ref 4.0–10.5)
nRBC: 0 % (ref 0.0–0.2)

## 2023-03-31 LAB — PROCALCITONIN: Procalcitonin: 6.15 ng/mL

## 2023-03-31 LAB — GLUCOSE, CAPILLARY
Glucose-Capillary: 96 mg/dL (ref 70–99)
Glucose-Capillary: 121 mg/dL — ABNORMAL HIGH (ref 70–99)

## 2023-03-31 LAB — HEPATITIS B SURFACE ANTIBODY, QUANTITATIVE: Hep B S AB Quant (Post): <3.5 m[IU]/mL — ABNORMAL LOW

## 2023-03-31 MED ORDER — DOXYCYCLINE HYCLATE 100 MG PO TABS: 100.0000 mg | ORAL_TABLET | Freq: Two times a day (BID) | ORAL | 0 refills | Status: AC

## 2023-03-31 MED ORDER — GLUCAGEN HYPOKIT 1 MG IJ SOLR: 1.0000 mg | Freq: Once | INTRAMUSCULAR | 3 refills | Status: AC | PRN

## 2023-03-31 MED ORDER — TORSEMIDE 40 MG PO TABS: 40.0000 mg | ORAL_TABLET | Freq: Every day | ORAL | Status: DC

## 2023-03-31 MED ORDER — CEFDINIR 300 MG PO CAPS: 300.0000 mg | ORAL_CAPSULE | Freq: Every day | ORAL | 0 refills | Status: AC

## 2023-03-31 MED ORDER — TORSEMIDE 20 MG PO TABS: 40.0000 mg | ORAL_TABLET | Freq: Every day | ORAL | Status: DC

## 2023-03-31 NOTE — Progress Notes (Addendum)
Pt tolerated tx well and goal met.  03/31/23 1932  Vitals  Temp 98.6 F (37 C)  Temp Source Oral  BP 132/65  BP Location Left Arm  BP Method Automatic  Patient Position (if appropriate) Lying  Pulse Rate 76  Resp 16  Oxygen Therapy  SpO2 100 %  O2 Device Room Air  During Treatment Monitoring  Intra-Hemodialysis Comments Tx completed  Post Treatment  Dialyzer Clearance Lightly streaked  Hemodialysis Intake (mL) 0 mL  Liters Processed 76  Fluid Removed (mL) 3000 mL  Tolerated HD Treatment Yes  Post-Hemodialysis Comments Pt goal met.  Hemodialysis Catheter Right Internal jugular Double lumen Permanent (Tunneled)  No placement date or time found.   Placed prior to admission: Yes  Orientation: Right  Access Location: Internal jugular  Hemodialysis Catheter Type: Double lumen Permanent (Tunneled)  Site Condition No complications  Blue Lumen Status Heparin locked  Red Lumen Status Heparin locked  Catheter fill solution Heparin 1000 units/ml  Catheter fill volume (Arterial) 2 cc  Catheter fill volume (Venous) 2  Dressing Type Transparent  Dressing Status Antimicrobial disc in place;Clean, Dry, Intact  Interventions New dressing  Drainage Description None  Dressing Change Due 04/05/23  Post treatment catheter status Capped and Clamped

## 2023-03-31 NOTE — Discharge Instructions (Signed)
IMPORTANT INFORMATION: PAY CLOSE ATTENTION  ° °PHYSICIAN DISCHARGE INSTRUCTIONS ° °Follow with Primary care provider  Kaplan, Kristen W., PA-C  and other consultants as instructed by your Hospitalist Physician ° °SEEK MEDICAL CARE OR RETURN TO EMERGENCY ROOM IF SYMPTOMS COME BACK, WORSEN OR NEW PROBLEM DEVELOPS  ° °Please note: °You were cared for by a hospitalist during your hospital stay. Every effort will be made to forward records to your primary care provider.  You can request that your primary care provider send for your hospital records if they have not received them.  Once you are discharged, your primary care physician will handle any further medical issues. Please note that NO REFILLS for any discharge medications will be authorized once you are discharged, as it is imperative that you return to your primary care physician (or establish a relationship with a primary care physician if you do not have one) for your post hospital discharge needs so that they can reassess your need for medications and monitor your lab values. ° °Please get a complete blood count and chemistry panel checked by your Primary MD at your next visit, and again as instructed by your Primary MD. ° °Get Medicines reviewed and adjusted: °Please take all your medications with you for your next visit with your Primary MD ° °Laboratory/radiological data: °Please request your Primary MD to go over all hospital tests and procedure/radiological results at the follow up, please ask your primary care provider to get all Hospital records sent to his/her office. ° °In some cases, they will be blood work, cultures and biopsy results pending at the time of your discharge. Please request that your primary care provider follow up on these results. ° °If you are diabetic, please bring your blood sugar readings with you to your follow up appointment with primary care.   ° °Please call and make your follow up appointments as soon as possible.   ° °Also  Note the following: °If you experience worsening of your admission symptoms, develop shortness of breath, life threatening emergency, suicidal or homicidal thoughts you must seek medical attention immediately by calling 911 or calling your MD immediately  if symptoms less severe. ° °You must read complete instructions/literature along with all the possible adverse reactions/side effects for all the Medicines you take and that have been prescribed to you. Take any new Medicines after you have completely understood and accpet all the possible adverse reactions/side effects.  ° °Do not drive when taking Pain medications or sleeping medications (Benzodiazepines) ° °Do not take more than prescribed Pain, Sleep and Anxiety Medications. It is not advisable to combine anxiety,sleep and pain medications without talking with your primary care practitioner ° °Special Instructions: If you have smoked or chewed Tobacco  in the last 2 yrs please stop smoking, stop any regular Alcohol  and or any Recreational drug use. ° °Wear Seat belts while driving.  Do not drive if taking any narcotic, mind altering or controlled substances or recreational drugs or alcohol.  ° ° ° ° ° °

## 2023-03-31 NOTE — Progress Notes (Signed)
Pt D/C home with husband. IV and telemetry removed. Discharge paperwork provided, no questions at this time.

## 2023-03-31 NOTE — Plan of Care (Signed)
  Problem: Education: Goal: Knowledge of General Education information will improve Description Including pain rating scale, medication(s)/side effects and non-pharmacologic comfort measures Outcome: Progressing   Problem: Health Behavior/Discharge Planning: Goal: Ability to manage health-related needs will improve Outcome: Progressing   

## 2023-03-31 NOTE — Plan of Care (Signed)

## 2023-03-31 NOTE — Progress Notes (Signed)
[facility] Progress Note   Assessment/ Plan:   Dialysis Orders: Center: [facility]  on TTS- confirmed with pt today EDW 117.5kg HD Bath 3K/2.5Ca  Time 4:00 Heparin none. Access TDC BFR 400 DFR 500    No micera or venofer   Assessment/Plan:  DKA - improved off of insulin drip per primary svc.  No IVF's as she is ESRD and has pulmonary edema and h/o HFrEF.  ESRD -  for HD today on schedule.  Confirmed with pt that she is TTS schedule.  Orders written for today  Hypertension/volume  - came in with hypertensive urgency.  UF as tolerated.  Bp improving UF and po meds.  Off of cardene drip.  Anemia  - Hgb stable at 11.3, no ESA  Metabolic bone disease -   continue with home meds  Nutrition -  renal diet, carb modified   Chronic combined systolic and diastolic CHF - UF with HD as above.   OSA - continue with CPAP.  Subjective:    Seen in room.  No complaints.  Today is HD day- she is TTS   Objective:   BP (!) 144/58 (BP Location: Left Arm)   Pulse 77   Temp 98.3 F (36.8 C) (Skin)   Resp 18   Ht 5\' 9"  (1.753 m)   Wt 118.1 kg   LMP 03/23/2017 (Approximate)   SpO2 92%   BMI 38.45 kg/m   Physical Exam: Gen:NAD, lying in bed, + nasal pillow CPAP CVS:  RRR Resp: unlabored Abd: soft Ext: no LE edema ACCESS: [facility]  Labs: BMET Recent Labs  Lab 03/28/23 1825 03/29/23 0318 03/29/23 0319 03/29/23 0738 03/29/23 1025 03/29/23 1452 03/30/23 0511 03/31/23 0418  NA 130*  --  129* 130* 131* 133* 134* 135  K 4.8  --  4.7 4.1 4.2 4.0 3.8 3.5  CL 95*  --  96* 96* 97* 96* 99 99  CO2 22  --  23 23 21* 25 24 29   GLUCOSE 490*  --  210* 225* 218* 240* 228* 87  BUN 37*  --  37* 39* 39* 20 27* 31*  CREATININE 3.21*  --  2.85* 2.98* 3.10* 1.75* 2.93* 3.61*  CALCIUM 9.7  --  9.7 9.7 9.8 8.8* 9.3 8.9  PHOS  --  3.3  --   --   --   --  2.8  --    CBC Recent Labs  Lab 03/28/23 1825 03/29/23 0318 03/30/23 0511 03/31/23 0418  WBC 8.3 15.7* 17.3* 12.1*  NEUTROABS 6.8  --    --  9.3*  HGB 11.5* 13.2 11.3* 10.1*  HCT 36.7 40.0 34.1* 32.3*  MCV 95.8 91.3 92.7 95.6  PLT 205 214 318 229      Medications:     aspirin EC  81 mg Oral QHS   buPROPion  300 mg Oral Daily   carvedilol  12.5 mg Oral BID WC   Chlorhexidine Gluconate Cloth  6 each Topical Daily   Chlorhexidine Gluconate Cloth  6 each Topical Q0600   clopidogrel  75 mg Oral Daily   doxycycline  100 mg Oral Q12H   feeding supplement (GLUCERNA SHAKE)  237 mL Oral TID BM   folic acid  1 mg Oral Daily   heparin  5,000 Units Subcutaneous Q8H   hydrALAZINE  10 mg Oral Q8H   insulin pump   Subcutaneous TID WC, HS, 0200   lamoTRIgine  200 mg Oral Daily   levothyroxine  175 mcg Oral  Q0600   melatonin  9 mg Oral QHS   pravastatin  80 mg Oral q1800   [START ON 04/01/2023] torsemide  40 mg Oral Daily    Bufford Buttner, MD 03/31/2023, 11:22 AM

## 2023-03-31 NOTE — Care Management Important Message (Signed)
Important Message  Patient Details  Name: Jasmine [name] MRN: [id number] Date of Birth: July 07, 1970   Important Message Given:  N/A - LOS <3 / Initial given by admissions     Corey Harold 03/31/2023, 12:26 PM

## 2023-03-31 NOTE — Discharge Summary (Signed)
Physician Discharge Summary  Jasmine [name] QMV:[id number] DOB: 04/13/1971 DOA: 03/28/2023  PCP: Richmond Campbell., PA-C  Admit date: 03/28/2023 Discharge date: 03/31/2023  Admitted From:  Home  Disposition: Home  Recommendations for Outpatient Follow-up:  Follow up with PCP in 1 weeks  Discharge Condition: STABLE   CODE STATUS: FULL DIET: renal/carb modified    Brief Hospitalization Summary: Please see all hospital notes, images, labs for full details of the hospitalization. 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  The patient states that her Dexcom glucose monitor was not working properly secondary to low battery.  There was also a malfunction with her insulin pump.  She checked her blood sugars and it was 600.  She tried giving herself some subcutaneous insulin without much improvement.  As result, she presented for further evaluation treatment. At the time of my evaluation in the morning of 03/29/2023, the patient remains somnolent.  She falls asleep without any stimulation.  She states that her CGM and insulin pump were not working properly.  She unable to clarify exactly what is going on with the insulin pump and feels that the battery and transmitter to his CGM is not working properly.  She endorses compliance with dialysis.  Last dialysis was on 10/19/20224.  She denies any fevers, chills, chest pain, shortness breath, cough, hemoptysis, abdominal pain.  She feels nauseous.  There is no diarrhea.  Notably, the patient had a recent hospital admission to [facility] from 12/27/2022 to 01/17/2023.  During that hospital admission, the patient was admitted for acute respiratory failure secondary to volume overload resulting from heart failure.  Echocardiogram  at that time showed EF of 35-40%, grade 1 DD, mild MR, mild pulmonary hypertension.  Repeat echocardiogram was done during hospitalization showed EF of 45% with apical wall motion abnormalities and thinning and akinesis.  Cardiology was consulted and nuclear medicine stress test on 01/05/2023 showed no significant ischemia but did show a large moderately severe fixed perfusion deficit involving the apical segment consistent with possible LAD disease versus stress-induced cardiomyopathy.  Given the patient's CKD, cardiology recommended medical management and outpatient follow-up.  The patient was started on clopidogrel and continued on her home carvedilol and statin.  She was discharged on her usual home nighttime oxygen.  The patient was initially diuresed with furosemide and metolazone.  Ultimately, decision was made to initiate the patient on HD.  She had a right-sided tunneled catheter placed and HD was initiated on 01/13/2023.   Hospital Course by problem list  DKA (diabetic ketoacidosis) - Treated and Resolved  -patient started on IV insulin  -Electrolytes were monitored and repleted -transitioned back to insulin pump on 10/23.  Follow closely per insulin pump orderset.  -advance diet as tolerated -12/2022 A1C--6.6   Leukemoid reaction -Check PCT - peaked at 7.54, now trending down  -Blood cultures x 2 sets -Personally reviewed chest x-ray--vascular congestion, no consolidation -WBC trending down with treatments   Presumed UTI - presented with high procalcitonin, now trending down - follow culture: no growth to date - treated with IV ceftriaxone and will DC on oral cefdinir x 3 d   Acute metabolic encephalopathy - RESOLVED  -pt seems back to baseline, no further confusion   ESRD -Patient dialyzes on Tuesday, Thursday, Saturday -Nephrology consulted for maintenance dialysis -She has residual renal function and  continues on torsemide 40 mg daily -HD received 10/22, 10/24 -pt to  discharge after HD treatment 10/24    HTN Urgency -continue carvedilol -hydralazine for non HD days  -weaned cardene infusion off and back on oral BP meds   Bipolar II disorder  Resume ativan Resume home cymbalta and lamictal temporarily   Mixed hyperlipidemia Continue statin   Chronic migraine w/o aura w/o status migrainosus, not intractable Holding Maxalt temporarily secondary to elevated blood pressure   Class 2 obesity BMI 39.00 Lifestyle modification   Hypothyroidism continue synthroid to 175 mcg daily   Hyponatremia Secondary to volume depletion and hyperglycemia   OSA -continue CPAP   History of RA -Diagnosed 2021, patient reports she is on disability for this and previously followed with rheum but stopped as the medications were not helping  Discharge Diagnoses:  Principal Problem:   DKA (diabetic ketoacidosis) (HCC) Active Problems:   Acquired hypothyroidism   Essential hypertension   OSA on CPAP   Hypertensive emergency   Pseudohyponatremia   Hypoalbuminemia due to protein-calorie malnutrition (HCC)   End-stage renal disease on hemodialysis (HCC)   Chronic HFrEF (heart failure with reduced ejection fraction) (HCC)   DKA, type 1 (HCC)   Discharge Instructions:  Allergies as of 03/31/2023       Reactions   Ciprofloxacin Swelling, Other (See Comments)   Per pt caused lips swell and nauseous feeling   Levaquin [levofloxacin] Swelling, Other (See Comments)   Per pt caused lips swell and nauseous feeling   Buspar [buspirone] Other (See Comments)   abd cramping   Linaclotide Other (See Comments)   Advair Diskus [fluticasone-salmeterol] Other (See Comments)   Thrush    Biaxin [clarithromycin] Rash   Hydroxyzine Palpitations        Medication List     STOP taking these medications    insulin detemir 100 UNIT/ML FlexPen Commonly known as: LEVEMIR   Insulin Pen Needle 31G X 5 MM Misc       TAKE these medications    buPROPion 300 MG 24  hr tablet Commonly known as: WELLBUTRIN XL Take 1 tablet (300 mg total) by mouth daily.   carvedilol 12.5 MG tablet Commonly known as: COREG Take 12.5 mg by mouth 2 (two) times daily with a meal.   cefdinir 300 MG capsule Commonly known as: OMNICEF Take 1 capsule (300 mg total) by mouth daily for 3 days.   Cholecalciferol 50 MCG (2000 UT) Tabs Take 1 tablet by mouth daily.   clopidogrel 75 MG tablet Commonly known as: PLAVIX Take 1 tablet (75 mg total) by mouth daily.   Continuous Blood Gluc Sensor Misc 1 each by Does not apply route as directed. Use as directed every 14 days. May dispense FreeStyle [employer] or similar.   doxycycline 100 MG tablet Commonly known as: VIBRA-TABS Take 1 tablet (100 mg total) by mouth every 12 (twelve) hours for 4 days.   FISH OIL PO Take 1 capsule by mouth daily.   folic acid 1 MG tablet Commonly known as: FOLVITE Take 1 mg by mouth daily.   GlucaGen HypoKit 1 MG Solr injection Generic drug: glucagon Inject 1 mg into the skin once as needed for up to 1 dose for low blood sugar. GlucaGen HypoKit 1 mg Injection What changed: See the new instructions.   hydrALAZINE 10 MG tablet Commonly known as: APRESOLINE Take 1 tablet (10 mg total) by mouth every 8 (eight) hours.   lamoTRIgine 200 MG tablet Commonly known as: LAMICTAL  Take 1 tablet (200 mg total) by mouth daily.   levothyroxine 175 MCG tablet Commonly known as: SYNTHROID TAKE ONE TABLET BY MOUTH DAILY AT 6am   LORazepam 0.5 MG tablet Commonly known as: ATIVAN Take 0.5 mg by mouth daily as needed for anxiety.   Melatonin 10 MG Tabs Take 1 tablet by mouth at bedtime.   NovoLOG 100 UNIT/ML injection Generic drug: insulin aspart Inject into the skin continuous. Sliding Scale   ondansetron 4 MG disintegrating tablet Commonly known as: Zofran ODT Take 1 tablet (4 mg total) by mouth every 8 (eight) hours as needed.   oxyCODONE-acetaminophen 5-325 MG tablet Commonly  known as: PERCOCET/ROXICET Take by mouth 4 (four) times daily as needed for severe pain.   OXYGEN Inhale 3 L into the lungs as needed.   pravastatin 80 MG tablet Commonly known as: PRAVACHOL Take 80 mg by mouth at bedtime.   ProAir HFA 108 (90 Base) MCG/ACT inhaler Generic drug: albuterol Inhale 1-2 puffs into the lungs every 6 (six) hours as needed for wheezing or shortness of breath.   Torsemide 40 MG Tabs Take 40 mg by mouth daily. What changed: when to take this        Follow-up Information     Richmond Campbell., PA-C. Schedule an appointment as soon as possible for a visit in 1 week(s).   Specialty: Family Medicine Why: Hospital Follow Up Contact information: [address] [place] [address] [phone]                Allergies  Allergen Reactions   Ciprofloxacin Swelling and Other (See Comments)    Per pt caused lips swell and nauseous feeling   Levaquin [Levofloxacin] Swelling and Other (See Comments)    Per pt caused lips swell and nauseous feeling   Buspar [Buspirone] Other (See Comments)    abd cramping   Linaclotide Other (See Comments)   Advair Diskus [Fluticasone-Salmeterol] Other (See Comments)    Thrush    Biaxin [Clarithromycin] Rash   Hydroxyzine Palpitations   Allergies as of 03/31/2023       Reactions   Ciprofloxacin Swelling, Other (See Comments)   Per pt caused lips swell and nauseous feeling   Levaquin [levofloxacin] Swelling, Other (See Comments)   Per pt caused lips swell and nauseous feeling   Buspar [buspirone] Other (See Comments)   abd cramping   Linaclotide Other (See Comments)   Advair Diskus [fluticasone-salmeterol] Other (See Comments)   Thrush    Biaxin [clarithromycin] Rash   Hydroxyzine Palpitations        Medication List     STOP taking these medications    insulin detemir 100 UNIT/ML FlexPen Commonly known as: LEVEMIR   Insulin Pen Needle 31G X 5 MM Misc       TAKE these medications     buPROPion 300 MG 24 hr tablet Commonly known as: WELLBUTRIN XL Take 1 tablet (300 mg total) by mouth daily.   carvedilol 12.5 MG tablet Commonly known as: COREG Take 12.5 mg by mouth 2 (two) times daily with a meal.   cefdinir 300 MG capsule Commonly known as: OMNICEF Take 1 capsule (300 mg total) by mouth daily for 3 days.   Cholecalciferol 50 MCG (2000 UT) Tabs Take 1 tablet by mouth daily.   clopidogrel 75 MG tablet Commonly known as: PLAVIX Take 1 tablet (75 mg total) by mouth daily.   Continuous Blood Gluc Sensor Misc 1 each by Does not apply route as directed.  Use as directed every 14 days. May dispense FreeStyle [employer] or similar.   doxycycline 100 MG tablet Commonly known as: VIBRA-TABS Take 1 tablet (100 mg total) by mouth every 12 (twelve) hours for 4 days.   FISH OIL PO Take 1 capsule by mouth daily.   folic acid 1 MG tablet Commonly known as: FOLVITE Take 1 mg by mouth daily.   GlucaGen HypoKit 1 MG Solr injection Generic drug: glucagon Inject 1 mg into the skin once as needed for up to 1 dose for low blood sugar. GlucaGen HypoKit 1 mg Injection What changed: See the new instructions.   hydrALAZINE 10 MG tablet Commonly known as: APRESOLINE Take 1 tablet (10 mg total) by mouth every 8 (eight) hours.   lamoTRIgine 200 MG tablet Commonly known as: LAMICTAL Take 1 tablet (200 mg total) by mouth daily.   levothyroxine 175 MCG tablet Commonly known as: SYNTHROID TAKE ONE TABLET BY MOUTH DAILY AT 6am   LORazepam 0.5 MG tablet Commonly known as: ATIVAN Take 0.5 mg by mouth daily as needed for anxiety.   Melatonin 10 MG Tabs Take 1 tablet by mouth at bedtime.   NovoLOG 100 UNIT/ML injection Generic drug: insulin aspart Inject into the skin continuous. Sliding Scale   ondansetron 4 MG disintegrating tablet Commonly known as: Zofran ODT Take 1 tablet (4 mg total) by mouth every 8 (eight) hours as needed.   oxyCODONE-acetaminophen  5-325 MG tablet Commonly known as: PERCOCET/ROXICET Take by mouth 4 (four) times daily as needed for severe pain.   OXYGEN Inhale 3 L into the lungs as needed.   pravastatin 80 MG tablet Commonly known as: PRAVACHOL Take 80 mg by mouth at bedtime.   ProAir HFA 108 (90 Base) MCG/ACT inhaler Generic drug: albuterol Inhale 1-2 puffs into the lungs every 6 (six) hours as needed for wheezing or shortness of breath.   Torsemide 40 MG Tabs Take 40 mg by mouth daily. What changed: when to take this        Procedures/Studies: DG CHEST PORT 1 VIEW  Result Date: 03/30/2023 CLINICAL DATA:  Shortness of breath. EXAM: PORTABLE CHEST 1 VIEW COMPARISON:  Chest radiograph 03/28/2023 FINDINGS: The cardiomediastinal silhouette is within normal limits. A right jugular dialysis catheter remains in place. Lung volumes are low with similar appearance of pulmonary vascular congestion. No confluent airspace opacity, sizeable pleural effusion, or pneumothorax is identified. No acute osseous abnormality is seen. IMPRESSION: Low lung volumes with pulmonary vascular congestion. Electronically Signed   By: Sebastian Ache M.D.   On: 03/30/2023 14:50   CT HEAD WO CONTRAST ( )  Result Date: 03/30/2023 CLINICAL DATA:  52 year old female with hyperglycemia. Altered mental status. EXAM: CT HEAD WITHOUT CONTRAST TECHNIQUE: Contiguous axial images were obtained from the base of the skull through the vertex without intravenous contrast. RADIATION DOSE REDUCTION: This exam was performed according to the departmental dose-optimization program which includes automated exposure control, adjustment of the mA and/or kV according to patient size and/or use of iterative reconstruction technique. COMPARISON:  Brain MRI 03/31/2022.  Head CT 09/10/2021. FINDINGS: Brain: Stable cerebral volume. No midline shift, ventriculomegaly, mass effect, evidence of mass lesion, intracranial hemorrhage or evidence of cortically based acute  infarction. Chronic patchy periventricular white matter hypodensity is moderate for age, stable from the MRI last year. Otherwise maintained gray-white differentiation. No cortical encephalomalacia identified. Vascular: No suspicious intracranial vascular hyperdensity. Calcified atherosclerosis at the skull base. Skull: No acute osseous abnormality identified. Sinuses/Orbits: Visualized paranasal sinuses and mastoids  are stable and well aerated. Other: No acute orbit or scalp soft tissue finding. Postoperative changes to both globes since last year. IMPRESSION: 1. No acute intracranial abnormality. 2. Moderate for age chronic white matter changes are stable, most commonly due to small vessel disease. Electronically Signed   By: Odessa Fleming M.D.   On: 03/30/2023 04:58   DG Chest Portable 1 View  Result Date: 03/28/2023 CLINICAL DATA:  Shortness of breath EXAM: PORTABLE CHEST 1 VIEW COMPARISON:  12/27/2022 FINDINGS: Cardiac shadow is stable. Vascular congestion is noted likely related to volume overload. Dialysis catheter is noted in place. No sizable effusion is seen. No focal infiltrate is noted. IMPRESSION: Vascular congestion likely related to volume overload. No other focal abnormality is noted. Electronically Signed   By: Alcide Clever M.D.   On: 03/28/2023 19:38     Subjective: Pt reports her appetite is improving and she is feeling better, she was concerned that she is due for HD today to stay on her TTS schedule.  Nephrology made arrangements for HD today.    Discharge Exam: Vitals:   03/30/23 2032 03/31/23 0523  BP: (!) 154/75 (!) 144/58  Pulse: 71 77  Resp:    Temp: 98.3 F (36.8 C) 98.3 F (36.8 C)  SpO2: 100% 92%   Vitals:   03/30/23 1824 03/30/23 1954 03/30/23 2032 03/31/23 0523  BP: (!) 140/60  (!) 154/75 (!) 144/58  Pulse: 72 70 71 77  Resp:  18    Temp:   98.3 F (36.8 C) 98.3 F (36.8 C)  TempSrc:   Oral Skin  SpO2: 91% 94% 100% 92%  Weight:      Height:       General: Pt  is alert, awake, not in acute distress Cardiovascular: normal S1/S2 +, no rubs, no gallops Respiratory: CTA bilaterally, no wheezing, no rhonchi Abdominal: Soft, NT, ND, bowel sounds + Extremities: no edema, no cyanosis   The results of significant diagnostics from this hospitalization (including imaging, microbiology, ancillary and laboratory) are listed below for reference.     Microbiology: Recent Results (from the past 240 hour(s))  MRSA Next Gen by PCR, Nasal     Status: None   Collection Time: 03/29/23 12:35 AM   Specimen: Nasal Mucosa; Nasal Swab  Result Value Ref Range Status   MRSA by PCR Next Gen NOT DETECTED NOT DETECTED Final    Comment: (NOTE) The GeneXpert MRSA Assay (FDA approved for NASAL specimens only), is one component of a comprehensive MRSA colonization surveillance program. It is not intended to diagnose MRSA infection nor to guide or monitor treatment for MRSA infections. Test performance is not FDA approved in patients less than 27 years old. Performed at [facility], [address] [place], [place] [address]   Urine Culture     Status: None   Collection Time: 03/29/23  9:31 AM   Specimen: Urine, Random  Result Value Ref Range Status   Specimen Description   Final    URINE, RANDOM Performed at [facility], [address] [place], [place] [address]    Special Requests   Final    NONE Reflexed from [id number] Performed at [facility], [address], [place], [place] [address]    Culture   Final    NO GROWTH Performed at [facility], [address], [place], [place] [address]    Report Status 03/30/2023 FINAL  Final  Culture, blood (Routine X 2) w Reflex to ID Panel  Status: None (Preliminary result)   Collection Time: 03/29/23 10:19 AM   Specimen: BLOOD  Result Value Ref Range Status   Specimen Description BLOOD BLOOD RIGHT ARM ac  Final   Special Requests   Final    BOTTLES DRAWN AEROBIC AND ANAEROBIC Blood Culture adequate volume    Culture   Final    NO GROWTH 2 DAYS Performed at [facility], [address] [place], [place] [address]    Report Status PENDING  Incomplete  Culture, blood (Routine X 2) w Reflex to ID Panel     Status: None (Preliminary result)   Collection Time: 03/29/23 10:25 AM   Specimen: BLOOD  Result Value Ref Range Status   Specimen Description BLOOD BLOOD LEFT ARM wrist  Final   Special Requests   Final    BOTTLES DRAWN AEROBIC ONLY Blood Culture adequate volume   Culture   Final    NO GROWTH 2 DAYS Performed at [facility], [address], [place], [place] [address]    Report Status PENDING  Incomplete     Labs: BNP (last 3 results) No results for input(s): "BNP" in the last 8760 hours. Basic Metabolic Panel: Recent Labs  Lab 03/29/23 0318 03/29/23 0319 03/29/23 0738 03/29/23 1025 03/29/23 1452 03/30/23 0511 03/31/23 0418  NA  --    < > 130* 131* 133* 134* 135  K  --    < > 4.1 4.2 4.0 3.8 3.5  CL  --    < > 96* 97* 96* 99 99  CO2  --    < > 23 21* 25 24 29   GLUCOSE  --    < > 225* 218* 240* 228* 87  BUN  --    < > 39* 39* 20 27* 31*  CREATININE  --    < > 2.98* 3.10* 1.75* 2.93* 3.61*  CALCIUM  --    < > 9.7 9.8 8.8* 9.3 8.9  MG 2.0  --   --   --   --   --   --   PHOS 3.3  --   --   --   --  2.8  --    < > = values in this interval not displayed.   Liver Function Tests: Recent Labs  Lab 03/28/23 1825 03/30/23 0511  AST 20  --   ALT 14  --   ALKPHOS 104  --   BILITOT 1.1  --   PROT 6.6  --   ALBUMIN 3.3* 3.2*   No results for input(s): "LIPASE", "AMYLASE" in the last 168 hours. Recent Labs  Lab 03/29/23 1025  AMMONIA 27   CBC: Recent Labs  Lab 03/28/23 1704 03/28/23 1825 03/29/23 0318 03/30/23 0511 03/31/23 0418  WBC  --  8.3 15.7* 17.3* 12.1*  NEUTROABS  --  6.8  --   --  9.3*  HGB 10.5* 11.5* 13.2 11.3* 10.1*  HCT 31.0* 36.7 40.0 34.1* 32.3*  MCV  --  95.8 91.3 92.7 95.6  PLT  --  205 214 318 229   Cardiac Enzymes: No results for input(s):  "CKTOTAL", "CKMB", "CKMBINDEX", "TROPONINI" in the last 168 hours. BNP: Invalid input(s): "POCBNP" CBG: Recent Labs  Lab 03/30/23 1121 03/30/23 1628 03/30/23 2057 03/31/23 0715 03/31/23 1121  GLUCAP 230* 115* 87 96 121*   D-Dimer No results for input(s): "DDIMER" in the last 72 hours. Hgb A1c Recent Labs    03/28/23 1825  HGBA1C 5.6   Lipid Profile No results for  input(s): "CHOL", "HDL", "LDLCALC", "TRIG", "CHOLHDL", "LDLDIRECT" in the last 72 hours. Thyroid function studies Recent Labs    03/29/23 1030  TSH 0.018*   Anemia work up Recent Labs    03/29/23 1030  VITAMINB12 362  FOLATE 19.4   Urinalysis    Component Value Date/Time   COLORURINE AMBER (A) 03/29/2023 0931   APPEARANCEUR CLOUDY (A) 03/29/2023 0931   LABSPEC 1.026 03/29/2023 0931   PHURINE 5.0 03/29/2023 0931   GLUCOSEU 150 (A) 03/29/2023 0931   HGBUR SMALL (A) 03/29/2023 0931   BILIRUBINUR NEGATIVE 03/29/2023 0931   KETONESUR 5 (A) 03/29/2023 0931   PROTEINUR >=300 (A) 03/29/2023 0931   UROBILINOGEN 0.2 03/19/2015 1525   NITRITE NEGATIVE 03/29/2023 0931   LEUKOCYTESUR NEGATIVE 03/29/2023 0931   Sepsis Labs Recent Labs  Lab 03/28/23 1825 03/29/23 0318 03/30/23 0511 03/31/23 0418  WBC 8.3 15.7* 17.3* 12.1*   Microbiology Recent Results (from the past 240 hour(s))  MRSA Next Gen by PCR, Nasal     Status: None   Collection Time: 03/29/23 12:35 AM   Specimen: Nasal Mucosa; Nasal Swab  Result Value Ref Range Status   MRSA by PCR Next Gen NOT DETECTED NOT DETECTED Final    Comment: (NOTE) The GeneXpert MRSA Assay (FDA approved for NASAL specimens only), is one component of a comprehensive MRSA colonization surveillance program. It is not intended to diagnose MRSA infection nor to guide or monitor treatment for MRSA infections. Test performance is not FDA approved in patients less than 61 years old. Performed at [facility], [address] [place], [place] [address]   Urine Culture      Status: None   Collection Time: 03/29/23  9:31 AM   Specimen: Urine, Random  Result Value Ref Range Status   Specimen Description   Final    URINE, RANDOM Performed at [facility], [address], [place], [place] [address]    Special Requests   Final    NONE Reflexed from [id number] Performed at [facility], [address] [place], [place] [address]    Culture   Final    NO GROWTH Performed at [facility], [address] [place], [place] [address]    Report Status 03/30/2023 FINAL  Final  Culture, blood (Routine X 2) w Reflex to ID Panel     Status: None (Preliminary result)   Collection Time: 03/29/23 10:19 AM   Specimen: BLOOD  Result Value Ref Range Status   Specimen Description BLOOD BLOOD RIGHT ARM ac  Final   Special Requests   Final    BOTTLES DRAWN AEROBIC AND ANAEROBIC Blood Culture adequate volume   Culture   Final    NO GROWTH 2 DAYS Performed at [facility], [address] [place], [place] [address]    Report Status PENDING  Incomplete  Culture, blood (Routine X 2) w Reflex to ID Panel     Status: None (Preliminary result)   Collection Time: 03/29/23 10:25 AM   Specimen: BLOOD  Result Value Ref Range Status   Specimen Description BLOOD BLOOD LEFT ARM wrist  Final   Special Requests   Final    BOTTLES DRAWN AEROBIC ONLY Blood Culture adequate volume   Culture   Final    NO GROWTH 2 DAYS Performed at [facility], [address], [place], [place] [address]    Report Status PENDING  Incomplete   Time coordinating discharge: 43 mins   SIGNED:  Standley Dakins, MD  [facility] 03/31/2023, 11:55  AM How to contact the [facility] Attending or Consulting provider 7A - 7P or covering provider during after hours 7P -7A, for this patient?  Check the care team in [facility] and look for a) attending/consulting [facility] provider listed and b) the [facility] team listed Log into [email or web address] and use [place]'s universal password to access. If you do not have the password,  please contact the hospital operator. Locate the [facility] provider you are looking for under [facility] and page to a number that you can be directly reached. If you still have difficulty reaching the provider, please page the [facility] (Director on Call) for the Hospitalists listed on amion for assistance.

## 2023-04-03 LAB — CULTURE, BLOOD (ROUTINE X 2)
Culture: NO GROWTH
Culture: NO GROWTH
Special Requests: ADEQUATE
Special Requests: ADEQUATE

## 2023-04-05 ENCOUNTER — Other Ambulatory Visit: Payer: Self-pay | Admitting: Psychiatry

## 2023-04-05 ENCOUNTER — Other Ambulatory Visit: Payer: Self-pay | Admitting: Primary Care
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
## 2023-04-19 ENCOUNTER — Other Ambulatory Visit: Payer: BC Managed Care – PPO

## 2023-04-20 ENCOUNTER — Encounter: Payer: Self-pay | Admitting: Psychiatry

## 2023-04-20 ENCOUNTER — Encounter (HOSPITAL_BASED_OUTPATIENT_CLINIC_OR_DEPARTMENT_OTHER): Payer: Self-pay

## 2023-04-20 ENCOUNTER — Telehealth (INDEPENDENT_AMBULATORY_CARE_PROVIDER_SITE_OTHER): Payer: BC Managed Care – PPO | Admitting: Psychiatry

## 2023-04-20 DIAGNOSIS — F3181 Bipolar II disorder: Secondary | ICD-10-CM | POA: Diagnosis not present

## 2023-04-20 DIAGNOSIS — G4700 Insomnia, unspecified: Secondary | ICD-10-CM | POA: Diagnosis not present

## 2023-04-20 DIAGNOSIS — F603 Borderline personality disorder: Secondary | ICD-10-CM | POA: Diagnosis not present

## 2023-04-20 DIAGNOSIS — F411 Generalized anxiety disorder: Secondary | ICD-10-CM | POA: Diagnosis not present

## 2023-04-20 MED ORDER — DULOXETINE HCL 30 MG PO CPEP: 30.0000 mg | ORAL_CAPSULE | Freq: Every day | ORAL | 0 refills | Status: DC

## 2023-04-20 MED ORDER — LORAZEPAM 0.5 MG PO TABS: 0.5000 mg | ORAL_TABLET | Freq: Every day | ORAL | 1 refills | Status: DC | PRN

## 2023-04-20 MED ORDER — LAMOTRIGINE 200 MG PO TABS: 200.0000 mg | ORAL_TABLET | Freq: Every day | ORAL | 5 refills | Status: DC

## 2023-04-20 MED ORDER — VILAZODONE HCL 10 MG PO TABS: ORAL_TABLET | ORAL | 1 refills | Status: DC

## 2023-04-20 NOTE — Patient Instructions (Signed)
Continue lamotrigine 200 mg daily  Decrease duloxetine 30 mg daily for one week, then discontinue Start Viibryd 5 mg at night for one week, then 10 mg at night  Continue bupropion 300 mg daily   Continue lorazepam 0.5 mg daily as needed for anxiety  Discontinue ramelteon  Next appointment: 1/15 at 10 am

## 2023-04-21 ENCOUNTER — Other Ambulatory Visit: Payer: Self-pay | Admitting: Neurology

## 2023-04-25 ENCOUNTER — Ambulatory Visit (INDEPENDENT_AMBULATORY_CARE_PROVIDER_SITE_OTHER)
Admission: RE | Admit: 2023-04-25 | Discharge: 2023-04-25 | Disposition: A | Discharge status: Home / Self Care | Payer: BC Managed Care – PPO | Source: Ambulatory Visit | Attending: Vascular Surgery

## 2023-04-25 ENCOUNTER — Encounter: Payer: Self-pay | Admitting: Surgery

## 2023-04-25 ENCOUNTER — Ambulatory Visit (INDEPENDENT_AMBULATORY_CARE_PROVIDER_SITE_OTHER): Payer: BC Managed Care – PPO | Admitting: Surgery

## 2023-04-25 ENCOUNTER — Ambulatory Visit (HOSPITAL_COMMUNITY)
Admission: RE | Admit: 2023-04-25 | Discharge: 2023-04-25 | Disposition: A | Discharge status: Home / Self Care | Payer: BC Managed Care – PPO | Source: Ambulatory Visit | Attending: Vascular Surgery | Admitting: Vascular Surgery

## 2023-04-25 VITALS — BP 161/84 | HR 63 | Temp 98.2°F | Resp 20 | Ht 69.0 in | Wt 253.0 lb

## 2023-04-25 DIAGNOSIS — N184 Chronic kidney disease, stage 4 (severe): Secondary | ICD-10-CM

## 2023-04-25 DIAGNOSIS — Z992 Dependence on renal dialysis: Secondary | ICD-10-CM

## 2023-04-25 DIAGNOSIS — N186 End stage renal disease: Secondary | ICD-10-CM | POA: Diagnosis not present

## 2023-04-25 NOTE — Progress Notes (Signed)
[facility] [place]  Patient name: Jasmine [name] MRN: [id number] DOB: July 10, 1970 Sex: female   REQUESTING PROVIDER:    Dr. Cherylann Ratel   REASON FOR CONSULT:    Dialysis  HISTORY OF PRESENT ILLNESS:   Jasmine [name] is a 52 y.o. female, who is referred for dialysis access.  Her renal failure secondary to diabetes and hypertension.  She is right-handed.  She does not have a pacemaker or defibrillator.  Patient suffers from diastolic heart failure.  She suffered a acute hypoxic respiratory failure and DKA that ultimately led to her starting dialysis and August 2024.  She allegedly has an appointment for peritoneal dialysis catheter insertion on December 4 at [facility].  Her only prior abdominal surgery is a oophorectomy     PAST MEDICAL HISTORY    Past Medical History:  Diagnosis Date   Anxiety    Arthritis    Asthma    Balance problems    Bipolar disorder (HCC)    Charcot ankle    Chronic fatigue    Chronic kidney disease    STAGE 3-4   Depression    Diabetes mellitus    DKA, type 1 (HCC) 11/04/2011   Elevated cholesterol    Fibromyalgia    GERD (gastroesophageal reflux disease)    Headache    History of suicidal ideation    Hyperlipemia    Hypertension    Hypothyroidism    IBS (irritable bowel syndrome)    Memory changes    Obesity    Sleep apnea    HAS C -PAP / DOES NOT USE   Stress incontinence    Pt had surgery to correct this.   Tachycardia    Tobacco abuse    Tremor    UTI (lower urinary tract infection)      FAMILY HISTORY   Family History  Problem Relation Age of Onset   Asthma Mother    Bipolar disorder Mother    Heart disease Father    Lymphoma Father    Hypertension Father    Thyroid disease Father    Hyperlipidemia Father    Diabetes Father    Cancer Paternal Grandmother        lung and breast   Bladder Cancer Paternal Grandfather    Suicidality Maternal Grandfather     Thyroid disease Brother     SOCIAL HISTORY:   Social History   Socioeconomic History   Marital status: Married    Spouse name: Not on file   Number of children: 0   Years of education: 12   Highest education level: Not on file  Occupational History   Occupation: Disabled  Tobacco Use   Smoking status: Former    Current packs/day: 0.00    Average packs/day: 0.8 packs/day for 20.0 years (15.0 ttl pk-yrs)    Types: Cigarettes    Start date: 06/08/1991    Quit date: 06/08/2011    Years since quitting: 11.8   Smokeless tobacco: Never  Vaping Use   Vaping status: Never Used  Substance and Sexual Activity   Alcohol use: No   Drug use: No   Sexual activity: Yes    Birth control/protection: Post-menopausal  Other Topics Concern   Not on file  Social History Narrative   Lives at home with husband.   Right-handed.   Occasional caffeine use.   Social Determinants of Health   Financial Resource Strain: Low Risk  (12/22/2022)   Received from [facility]  Overall Financial Resource Strain (CARDIA)    Difficulty of Paying Living Expenses: Not hard at all  Food Insecurity: No Food Insecurity (03/29/2023)   Hunger Vital Sign    Worried About Running Out of Food in the Last Year: Never true    Ran Out of Food in the Last Year: Never true  Transportation Needs: No Transportation Needs (03/29/2023)   PRAPARE - Administrator, Civil Service (Medical): No    Lack of Transportation (Non-Medical): No  Physical Activity: Inactive (01/23/2019)   Exercise Vital Sign    Days of Exercise per Week: 0 days    Minutes of Exercise per Session: 0 min  Stress: Stress Concern Present (01/23/2019)   [employer] of Occupational Health - Occupational Stress Questionnaire    Feeling of Stress : Very much  Social Connections: Unknown (10/19/2021)   Received from [facility]   Social Network    Social Network: Not on file  Intimate Partner Violence: Not At Risk  (03/29/2023)   Humiliation, Afraid, Rape, and Kick questionnaire    Fear of Current or Ex-Partner: No    Emotionally Abused: No    Physically Abused: No    Sexually Abused: No    ALLERGIES:    Allergies  Allergen Reactions   Ciprofloxacin Swelling and Other (See Comments)    Per pt caused lips swell and nauseous feeling   Levaquin [Levofloxacin] Swelling and Other (See Comments)    Per pt caused lips swell and nauseous feeling   Buspar [Buspirone] Other (See Comments)    abd cramping   Linaclotide Other (See Comments)   Advair Diskus [Fluticasone-Salmeterol] Other (See Comments)    Thrush    Biaxin [Clarithromycin] Rash   Hydroxyzine Palpitations    CURRENT MEDICATIONS:    Current Outpatient Medications  Medication Sig Dispense Refill   albuterol (VENTOLIN HFA) 108 (90 Base) MCG/ACT inhaler Inhale 1-2 puffs into the lungs every 6 (six) hours as needed for wheezing or shortness of breath. 6.7 g 1   buPROPion (WELLBUTRIN XL) 300 MG 24 hr tablet Take 1 tablet (300 mg total) by mouth daily. 30 tablet 5   carvedilol (COREG) 12.5 MG tablet Take 12.5 mg by mouth 2 (two) times daily with a meal.     Cholecalciferol 50 MCG (2000 UT) TABS Take 1 tablet by mouth daily.      clopidogrel (PLAVIX) 75 MG tablet Take 1 tablet (75 mg total) by mouth daily. 30 tablet 5   Continuous Blood Gluc Sensor MISC 1 each by Does not apply route as directed. Use as directed every 14 days. May dispense FreeStyle [employer] or similar.     DULoxetine (CYMBALTA) 30 MG capsule Take 1 capsule (30 mg total) by mouth daily for 7 days. 7 capsule 0   folic acid (FOLVITE) 1 MG tablet Take 1 mg by mouth daily.     glucagon (GLUCAGEN HYPOKIT) 1 MG SOLR injection Inject 1 mg into the skin once as needed for up to 1 dose for low blood sugar. GlucaGen HypoKit 1 mg Injection 1 each 3   hydrALAZINE (APRESOLINE) 10 MG tablet Take 1 tablet (10 mg total) by mouth every 8 (eight) hours. 90 tablet 1   lamoTRIgine  (LAMICTAL) 200 MG tablet Take 1 tablet (200 mg total) by mouth daily. 30 tablet 5   levothyroxine (SYNTHROID) 175 MCG tablet TAKE ONE TABLET BY MOUTH DAILY AT 6am     [START ON 05/05/2023] LORazepam (ATIVAN) 0.5 MG tablet  Take 1 tablet (0.5 mg total) by mouth daily as needed for anxiety. 30 tablet 1   Melatonin 10 MG TABS Take 1 tablet by mouth at bedtime.     NOVOLOG 100 UNIT/ML injection Inject into the skin continuous. Sliding Scale     Omega-3 Fatty Acids (FISH OIL PO) Take 1 capsule by mouth daily.     ondansetron (ZOFRAN ODT) 4 MG disintegrating tablet Take 1 tablet (4 mg total) by mouth every 8 (eight) hours as needed. 20 tablet 6   oxyCODONE-acetaminophen (PERCOCET/ROXICET) 5-325 MG tablet Take by mouth 4 (four) times daily as needed for severe pain.     OXYGEN Inhale 3 L into the lungs as needed.     pravastatin (PRAVACHOL) 80 MG tablet Take 80 mg by mouth at bedtime.     Torsemide 40 MG TABS Take 40 mg by mouth daily.     Vilazodone HCl (VIIBRYD) 10 MG TABS 5 mg daily for one week, then 10 mg daily 30 tablet 1   No current facility-administered medications for this visit.    REVIEW OF SYSTEMS:   [X]  denotes positive finding, [ ]  denotes negative finding Cardiac  Comments:  Chest pain or chest pressure:    Shortness of breath upon exertion:    Short of breath when lying flat:    Irregular heart rhythm:        Vascular    Pain in calf, thigh, or hip brought on by ambulation:    Pain in feet at night that wakes you up from your sleep:     Blood clot in your veins:    Leg swelling:         Pulmonary    Oxygen at home:    Productive cough:     Wheezing:         Neurologic    Sudden weakness in arms or legs:     Sudden numbness in arms or legs:     Sudden onset of difficulty speaking or slurred speech:    Temporary loss of vision in one eye:     Problems with dizziness:         Gastrointestinal    Blood in stool:      Vomited blood:         Genitourinary     Burning when urinating:     Blood in urine:        Psychiatric    Major depression:         Hematologic    Bleeding problems:    Problems with blood clotting too easily:        Skin    Rashes or ulcers:        Constitutional    Fever or chills:     PHYSICAL EXAM:   Vitals:   04/25/23 1348  BP: (!) 161/84  Pulse: 63  Resp: 20  Temp: 98.2 F (36.8 C)  SpO2: 97%  Weight: 253 lb (114.8 kg)  Height: 5\' 9"  (1.753 m)    GENERAL: The patient is a well-nourished female, in no acute distress. The vital signs are documented above. CARDIAC: There is a regular rate and rhythm.  VASCULAR: Palpable radial pulse bilaterally PULMONARY: Nonlabored respirations ABDOMEN: Soft and non-tender  MUSCULOSKELETAL: There are no major deformities or cyanosis. NEUROLOGIC: No focal weakness or paresthesias are detected. SKIN: There are no ulcers or rashes noted. PSYCHIATRIC: The patient has a normal affect.  STUDIES:   I have reviewed the following: -----------------+-------------+----------+--------+  Left Cephalic    Diameter (cm)Depth (cm)Findings  +-----------------+-------------+----------+--------+  Shoulder            0.31                         +-----------------+-------------+----------+--------+  Prox upper arm       0.33                         +-----------------+-------------+----------+--------+  Mid upper arm        0.33                         +-----------------+-------------+----------+--------+  Dist upper arm       0.37                         +-----------------+-------------+----------+--------+  Antecubital fossa    0.42                         +-----------------+-------------+----------+--------+  Prox forearm         0.29                         +-----------------+-------------+----------+--------+  Mid forearm          0.25                         +-----------------+-------------+----------+--------+  Dist forearm          0.18                         +-----------------+-------------+----------+--------+   +-----------------+-------------+----------+--------+  Left Basilic     Diameter (cm)Depth (cm)Findings  +-----------------+-------------+----------+--------+  Mid upper arm        0.51                         +-----------------+-------------+----------+--------+  Dist upper arm       0.43                         +-----------------+-------------+----------+--------+  Antecubital fossa    0.24                          ASSESSMENT and PLAN   End-stage renal disease: I discussed with the patient that based on vein mapping, she would be an appropriate candidate for a left upper arm fistula, likely a brachiocephalic fistula although she potentially could need a basilic vein procedure.  I talked about the possibility of additional procedures, the risk of not maturity, and the risk of steal syndrome.  All questions were answered.  I also discussed placing a graft if her veins turned out to not be very healthy.  She tells me that she is scheduled to have a peritoneal dialysis catheter placed at [facility] on December 4.  I told her that it would be nice to do both procedures in the same setting or only do the peritoneal dialysis catheter to minimize her time under anesthesia.  She is going to speak with her nephrologist and figure out the most appropriate course of action.  If she needs the fistula and the peritoneal catheter placed simultaneously I would be happy to do so alternatively this could be performed  at Sandi Mealy, MD, FACS [facility] [phone] Pager [phone]

## 2023-04-26 ENCOUNTER — Encounter: Payer: BC Managed Care – PPO | Admitting: Vascular Surgery

## 2023-04-27 ENCOUNTER — Encounter (HOSPITAL_BASED_OUTPATIENT_CLINIC_OR_DEPARTMENT_OTHER): Payer: Self-pay | Admitting: Pulmonary Disease

## 2023-04-27 ENCOUNTER — Encounter (HOSPITAL_BASED_OUTPATIENT_CLINIC_OR_DEPARTMENT_OTHER): Payer: Self-pay

## 2023-04-27 ENCOUNTER — Ambulatory Visit (HOSPITAL_BASED_OUTPATIENT_CLINIC_OR_DEPARTMENT_OTHER): Payer: BC Managed Care – PPO | Admitting: Pulmonary Disease

## 2023-04-28 ENCOUNTER — Telehealth: Payer: Self-pay

## 2023-04-28 NOTE — Telephone Encounter (Signed)
Attempted to reach patient to schedule for a left arm fistula vs graft. Left VM for patient to return call.

## 2023-04-28 NOTE — Telephone Encounter (Addendum)
Patient returned call stating, "Dr. Cherylann Ratel told me on Tuesday that I do not need to be scheduled for dialysis surgery in my arm, if I am having the catheter placed in my stomach." Patient is scheduled for PD catheter insertion on 12/4 with [facility]. Advised patient, I will pass this information along to Dr. Myra Gianotti. She voiced understanding.

## 2023-05-04 ENCOUNTER — Other Ambulatory Visit: Payer: Self-pay | Admitting: Psychiatry

## 2023-05-04 ENCOUNTER — Encounter: Payer: BC Managed Care – PPO | Admitting: Vascular Surgery

## 2023-05-04 ENCOUNTER — Telehealth: Payer: Self-pay

## 2023-05-04 ENCOUNTER — Other Ambulatory Visit (HOSPITAL_COMMUNITY): Payer: BC Managed Care – PPO

## 2023-05-04 ENCOUNTER — Encounter (HOSPITAL_COMMUNITY): Payer: BC Managed Care – PPO

## 2023-05-04 MED ORDER — DULOXETINE HCL 30 MG PO CPEP: 30.0000 mg | ORAL_CAPSULE | Freq: Every day | ORAL | 1 refills | Status: DC

## 2023-05-04 NOTE — Telephone Encounter (Signed)
left message notifying pt

## 2023-05-04 NOTE — Telephone Encounter (Signed)
Considering her current renal function, please recommend her to restarting and maintaining duloxetine at 30 mg daily for now. Medication is ordered.

## 2023-05-04 NOTE — Telephone Encounter (Signed)
pt states that she can not afford the new rx she states she wants to go back on the cymbalta. pt was last seen on 11-13 next appt 1-15

## 2023-05-09 ENCOUNTER — Ambulatory Visit: Payer: BC Managed Care – PPO | Admitting: Physician Assistant

## 2023-05-10 NOTE — Telephone Encounter (Signed)
Per Dr. Myra Gianotti, "[facility], please make sure its posted for and consented for only PD cath."  Spoke with patient and she reports her PD catheter surgery has been postponed for now due to needing additional studies for her heart, but she plans to proceed with surgery afterwards with [facility].

## 2023-05-16 ENCOUNTER — Encounter (HOSPITAL_COMMUNITY): Payer: Self-pay

## 2023-05-18 ENCOUNTER — Encounter (HOSPITAL_COMMUNITY): Admission: RE | Admit: 2023-05-18 | Payer: BC Managed Care – PPO | Source: Ambulatory Visit

## 2023-05-25 ENCOUNTER — Other Ambulatory Visit (HOSPITAL_COMMUNITY): Payer: BC Managed Care – PPO

## 2023-06-01 ENCOUNTER — Emergency Department (HOSPITAL_COMMUNITY)
Admission: EM | Admit: 2023-06-01 | Discharge: 2023-06-01 | Disposition: A | Discharge status: Home / Self Care | Payer: BC Managed Care – PPO | Attending: Emergency Medicine | Admitting: Emergency Medicine

## 2023-06-01 ENCOUNTER — Encounter (HOSPITAL_COMMUNITY): Payer: Self-pay

## 2023-06-01 ENCOUNTER — Other Ambulatory Visit: Payer: Self-pay

## 2023-06-01 DIAGNOSIS — I120 Hypertensive chronic kidney disease with stage 5 chronic kidney disease or end stage renal disease: Secondary | ICD-10-CM | POA: Diagnosis present

## 2023-06-01 DIAGNOSIS — R519 Headache, unspecified: Secondary | ICD-10-CM

## 2023-06-01 DIAGNOSIS — N186 End stage renal disease: Secondary | ICD-10-CM | POA: Insufficient documentation

## 2023-06-01 DIAGNOSIS — R0989 Other specified symptoms and signs involving the circulatory and respiratory systems: Secondary | ICD-10-CM

## 2023-06-01 DIAGNOSIS — Z79899 Other long term (current) drug therapy: Secondary | ICD-10-CM | POA: Diagnosis not present

## 2023-06-01 DIAGNOSIS — E1122 Type 2 diabetes mellitus with diabetic chronic kidney disease: Secondary | ICD-10-CM | POA: Insufficient documentation

## 2023-06-01 DIAGNOSIS — Z992 Dependence on renal dialysis: Secondary | ICD-10-CM | POA: Insufficient documentation

## 2023-06-01 MED ORDER — ACETAMINOPHEN 325 MG PO TABS
650.0000 mg | ORAL_TABLET | Freq: Once | ORAL | Status: AC
  Administered 2023-06-01: 650 mg via ORAL
  Filled 2023-06-01: qty 2

## 2023-06-01 NOTE — ED Notes (Signed)
Per [facility], "Pt was in a verbal argument with her significant other at home and called 911 to get away and get out of the house."

## 2023-06-01 NOTE — ED Provider Notes (Signed)
[place] EMERGENCY DEPARTMENT AT [facility] Provider Note   CSN: [id number] Arrival date & time: 06/01/23  0000     History  Chief Complaint  Patient presents with   Hypertension    Jasmine [name] is a 52 y.o. female.  The history is provided by the patient.  Hypertension  She has history of hypertension, diabetes, hyperlipidemia, end-stage renal disease on hemodialysis and comes in because her blood pressure was very high at home.  Blood pressure was greater than 200/100.  She took her evening hydralazine, but blood pressure did not come down.  She has also developed a bifrontal headache tonight.  This is not unusual for her.  She denies any blurred vision, nausea, vomiting.  She usually takes acetaminophen when she gets similar headaches.  She denies chest pain, dyspnea.   Home Medications Prior to Admission medications   Medication Sig Start Date End Date Taking? Authorizing Provider  albuterol (VENTOLIN HFA) 108 (90 Base) MCG/ACT inhaler Inhale 1-2 puffs into the lungs every 6 (six) hours as needed for wheezing or shortness of breath. 04/07/23   Oretha Milch, MD  buPROPion (WELLBUTRIN XL) 300 MG 24 hr tablet Take 1 tablet (300 mg total) by mouth daily. 03/23/23 09/19/23  Neysa Hotter, MD  carvedilol (COREG) 12.5 MG tablet Take 12.5 mg by mouth 2 (two) times daily with a meal. 01/17/23 01/17/24  [provider]  Cholecalciferol 50 MCG (2000 UT) TABS Take 1 tablet by mouth daily.  07/26/18   [provider]  clopidogrel (PLAVIX) 75 MG tablet Take 1 tablet (75 mg total) by mouth daily. 02/22/23   Alver Sorrow, NP  Continuous Blood Gluc Sensor MISC 1 each by Does not apply route as directed. Use as directed every 14 days. May dispense FreeStyle [employer] or similar.    [provider]  DULoxetine (CYMBALTA) 30 MG capsule Take 1 capsule (30 mg total) by mouth daily. 05/04/23 07/03/23  Neysa Hotter, MD  folic acid (FOLVITE) 1 MG  tablet Take 1 mg by mouth daily.    [provider]  glucagon (GLUCAGEN HYPOKIT) 1 MG SOLR injection Inject 1 mg into the skin once as needed for up to 1 dose for low blood sugar. GlucaGen HypoKit 1 mg Injection 03/31/23   Johnson, Clanford L, MD  hydrALAZINE (APRESOLINE) 10 MG tablet Take 1 tablet (10 mg total) by mouth every 8 (eight) hours. 09/12/21   Catarina Hartshorn, MD  lamoTRIgine (LAMICTAL) 200 MG tablet Take 1 tablet (200 mg total) by mouth daily. 04/24/23 10/21/23  Neysa Hotter, MD  levothyroxine (SYNTHROID) 175 MCG tablet TAKE ONE TABLET BY MOUTH DAILY AT 6am 11/20/21   [provider]  LORazepam (ATIVAN) 0.5 MG tablet Take 1 tablet (0.5 mg total) by mouth daily as needed for anxiety. 05/05/23 07/04/23  Neysa Hotter, MD  Melatonin 10 MG TABS Take 1 tablet by mouth at bedtime.    [provider]  NOVOLOG 100 UNIT/ML injection Inject into the skin continuous. Sliding Scale 03/15/22   [provider]  Omega-3 Fatty Acids (FISH OIL PO) Take 1 capsule by mouth daily.    [provider]  ondansetron (ZOFRAN ODT) 4 MG disintegrating tablet Take 1 tablet (4 mg total) by mouth every 8 (eight) hours as needed. 03/23/21   Levert Feinstein, MD  oxyCODONE-acetaminophen (PERCOCET/ROXICET) 5-325 MG tablet Take by mouth 4 (four) times daily as needed for severe pain.    [provider]  OXYGEN Inhale 3  L into the lungs as needed.    [provider]  pravastatin (PRAVACHOL) 80 MG tablet Take 80 mg by mouth at bedtime. 09/04/15   [provider]  Torsemide 40 MG TABS Take 40 mg by mouth daily. 03/31/23   Johnson, Clanford L, MD      Allergies    Ciprofloxacin, Levaquin [levofloxacin], Buspar [buspirone], Linaclotide, Advair diskus [fluticasone-salmeterol], Biaxin [clarithromycin], and Hydroxyzine    Review of Systems   Review of Systems  All other systems reviewed and are negative.   Physical Exam Updated Vital Signs BP (!) 158/66 (BP  Location: Left Arm)   Pulse 62   Temp 97.7 F (36.5 C) (Oral)   Resp 16   Ht 5\' 9"  (1.753 m)   Wt 115 kg   LMP 03/23/2017 (Approximate)   SpO2 93%   BMI 37.44 kg/m  Physical Exam Vitals and nursing note reviewed.   52 year old female, resting comfortably and in no acute distress. Vital signs are significant for elevated blood pressure. Oxygen saturation is 93%, which is normal. Head is normocephalic and atraumatic. PERRLA, EOMI. Oropharynx is clear.  There is mild tenderness to palpation over the temporalis muscles bilaterally, no tenderness over the insertion of the paracervical muscles. Neck is nontender and supple without adenopathy or JVD. Back is nontender and there is no CVA tenderness. Lungs are clear without rales, wheezes, or rhonchi. Chest is nontender.  Dialysis access catheters are present on the right. Heart has regular rate and rhythm without murmur. Abdomen is soft, flat, nontender. Extremities have no cyanosis or edema, full range of motion is present. Skin is warm and dry without rash. Neurologic: Mental status is normal, cranial nerves are intact, moves all extremities equally.  ED Results / Procedures / Treatments   Labs (all labs ordered are listed, but only abnormal results are displayed) Labs Reviewed - No data to display  EKG None  Radiology No results found.  Procedures Procedures    Medications Ordered in ED Medications - No data to display  ED Course/ Medical Decision Making/ A&P                                 Medical Decision Making Risk OTC drugs.   Severe hypertension at home which is only mild in the emergency department.  Headache likely muscle contraction headache.  No red flags to suggest more serious headache pathology such as subarachnoid hemorrhage, temporal arteritis and no ancillary findings to suggest migraine.  Since blood pressure is at a reasonable level, I do not feel she needs any additional workup.  I have ordered a  dose of acetaminophen for her headache and will observe her for in the emergency department.  Following administration of acetaminophen, headache is completely resolved.  Blood pressure is actually back down to normal.  No indication for acute intervention.  I have advised the patient to continue monitoring her blood pressure at home and work with her physicians to adjust her antihypertensive medication as needed.  Final Clinical Impression(s) / ED Diagnoses Final diagnoses:  Labile hypertension  Acute nonintractable headache, unspecified headache type  End-stage renal disease on hemodialysis [facility])    Rx / DC Orders ED Discharge Orders     None         Dione Booze, MD 06/01/23 [phone]

## 2023-06-01 NOTE — ED Triage Notes (Signed)
Pt did receive dialysis yesterday.

## 2023-06-01 NOTE — ED Triage Notes (Signed)
Pt arrived from home via [facility] for a sick call. Pt endorses headache, and high blood pressure. PTA, Pt did take her BP meds at home. Per ems, Pts last BP reading was 150/90.

## 2023-06-01 NOTE — Discharge Instructions (Addendum)
Continue to monitor your blood pressure at home, work with your physicians to adjust your blood pressure medications as needed.

## 2023-06-02 ENCOUNTER — Other Ambulatory Visit: Payer: Self-pay | Admitting: Psychiatry

## 2023-06-13 ENCOUNTER — Encounter (HOSPITAL_COMMUNITY): Payer: Self-pay

## 2023-06-14 ENCOUNTER — Telehealth (HOSPITAL_COMMUNITY): Payer: Self-pay | Admitting: *Deleted

## 2023-06-14 NOTE — Telephone Encounter (Signed)
 Attempted to call patient regarding upcoming cardiac PET appointment. Left message on voicemail with name and callback number  Larey Brick RN Navigator Cardiac Imaging [facility] [phone] Office [phone] Cell  Reminder to avoid caffeine 12 hours prior to her appt.

## 2023-06-14 NOTE — Telephone Encounter (Signed)
 Patient returning call upcoming cardiac imaging study; pt verbalizes understanding of appt date/time, parking situation and where to check in, pre-test NPO status and verified current allergies; name and call back number provided for further questions should they arise  Chantal Requena RN Navigator Cardiac Imaging [facility] [phone] office [phone] cell  Patient aware to avoid caffeine 12 hours prior to her cardiac PET study.

## 2023-06-15 ENCOUNTER — Encounter (HOSPITAL_COMMUNITY)
Admission: RE | Admit: 2023-06-15 | Discharge: 2023-06-15 | Disposition: A | Discharge status: Home / Self Care | Payer: BC Managed Care – PPO | Source: Ambulatory Visit | Attending: Family | Admitting: Family

## 2023-06-15 DIAGNOSIS — I5020 Unspecified systolic (congestive) heart failure: Secondary | ICD-10-CM | POA: Diagnosis present

## 2023-06-15 MED ORDER — REGADENOSON 0.4 MG/5ML IV SOLN
0.4000 mg | Freq: Once | INTRAVENOUS | Status: AC
  Administered 2023-06-15: 0.4 mg via INTRAVENOUS

## 2023-06-15 MED ORDER — RUBIDIUM RB82 GENERATOR (RUBYFILL)
25.0000 | PACK | Freq: Once | INTRAVENOUS | Status: AC
  Administered 2023-06-15: 29.81 via INTRAVENOUS

## 2023-06-15 MED ORDER — REGADENOSON 0.4 MG/5ML IV SOLN
INTRAVENOUS | Status: AC
Start: 2023-06-15 — End: ?
  Filled 2023-06-15: qty 5

## 2023-06-15 MED ORDER — RUBIDIUM RB82 GENERATOR (RUBYFILL)
25.0000 | PACK | Freq: Once | INTRAVENOUS | Status: AC
  Administered 2023-06-15: 29.78 via INTRAVENOUS

## 2023-06-16 LAB — NM PET CT CARDIAC PERFUSION MULTI W/ABSOLUTE BLOODFLOW
MBFR: 1.54
Nuc Rest EF: 56 %
Nuc Stress EF: 60 %
Rest MBF: 0.63 ml/g/min
Rest Nuclear Isotope Dose: 29.8 mCi
ST Depression (mm): 0 mm
Stress MBF: 0.97 ml/g/min
Stress Nuclear Isotope Dose: 29.8 mCi
TID: 1.09

## 2023-06-17 ENCOUNTER — Telehealth (HOSPITAL_BASED_OUTPATIENT_CLINIC_OR_DEPARTMENT_OTHER): Payer: Self-pay | Admitting: Family

## 2023-06-17 NOTE — Telephone Encounter (Signed)
 Follow up:     Patient says she is returning a call to caitlin Walker.

## 2023-06-17 NOTE — Telephone Encounter (Signed)
 Called - went straight to VM (likely call blocker in place).  Will re-attempt Monday due to office closure today 06/17/23.  Alver Sorrow, NP
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
## 2023-06-20 ENCOUNTER — Telehealth (HOSPITAL_BASED_OUTPATIENT_CLINIC_OR_DEPARTMENT_OTHER): Payer: Self-pay

## 2023-06-20 NOTE — Telephone Encounter (Signed)
 Called and spoke to patient - appointment with Dr. Cristal Deer has been verified.

## 2023-06-20 NOTE — Telephone Encounter (Signed)
-----   Message from Reche GORMAN Finder sent at 06/17/2023  2:29 PM EST ----- Cardiac PET shows heart pumping function is normal which is good!  There is evidence of ischemia (reduced blood flow) through one of the heart arteries which makes the test high risk. This indicates that further testing such as cardiac catheterization is likely needed however not emergent requiring hospitalization.   Sooner OV scheduled for 06/24/23 at 10:40 AM.   Called and left VM requesting call back. Will additionally send Mychart message.  If she does not read MyChart - triage team please call and make sure she is aware of results. If unable to come to 1/17 visit, okay to leave 1/24 as scheduled.

## 2023-06-20 NOTE — Telephone Encounter (Signed)
 Called and spoke to patient to verify appt with Dr. Cristal Deer on 1/17. Pt stated appt time is okay.

## 2023-06-22 ENCOUNTER — Telehealth (INDEPENDENT_AMBULATORY_CARE_PROVIDER_SITE_OTHER): Payer: BC Managed Care – PPO | Admitting: Psychiatry

## 2023-06-22 ENCOUNTER — Encounter: Payer: Self-pay | Admitting: Psychiatry

## 2023-06-22 DIAGNOSIS — F411 Generalized anxiety disorder: Secondary | ICD-10-CM

## 2023-06-22 DIAGNOSIS — F3181 Bipolar II disorder: Secondary | ICD-10-CM

## 2023-06-22 DIAGNOSIS — F603 Borderline personality disorder: Secondary | ICD-10-CM | POA: Diagnosis not present

## 2023-06-22 DIAGNOSIS — G4700 Insomnia, unspecified: Secondary | ICD-10-CM

## 2023-06-22 MED ORDER — DULOXETINE HCL 30 MG PO CPEP: 30.0000 mg | ORAL_CAPSULE | Freq: Every day | ORAL | 3 refills | Status: DC

## 2023-06-22 MED ORDER — LORAZEPAM 0.5 MG PO TABS: 0.5000 mg | ORAL_TABLET | Freq: Every day | ORAL | 2 refills | Status: DC | PRN

## 2023-06-22 NOTE — Patient Instructions (Addendum)
 Continue lamotrigine  200 mg daily Continue duloxetine  30 mg daily  Continue bupropion  300 mg daily   Continue lorazepam  0.5 mg daily as needed for anxiety - one refill left  Next appointment: 2/28 at 9 30

## 2023-06-24 ENCOUNTER — Ambulatory Visit (HOSPITAL_BASED_OUTPATIENT_CLINIC_OR_DEPARTMENT_OTHER): Payer: BC Managed Care – PPO | Admitting: Cardiology

## 2023-06-24 ENCOUNTER — Encounter (HOSPITAL_BASED_OUTPATIENT_CLINIC_OR_DEPARTMENT_OTHER): Payer: Self-pay

## 2023-06-27 ENCOUNTER — Encounter (HOSPITAL_BASED_OUTPATIENT_CLINIC_OR_DEPARTMENT_OTHER): Payer: Self-pay | Admitting: Cardiology
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
## 2023-07-01 ENCOUNTER — Ambulatory Visit: Payer: BC Managed Care – PPO | Attending: General Practice | Admitting: General Practice

## 2023-07-01 ENCOUNTER — Ambulatory Visit (HOSPITAL_BASED_OUTPATIENT_CLINIC_OR_DEPARTMENT_OTHER): Payer: BC Managed Care – PPO | Admitting: Cardiology

## 2023-07-01 ENCOUNTER — Encounter: Payer: Self-pay | Admitting: General Practice

## 2023-07-01 VITALS — BP 136/70 | HR 93 | Ht 69.5 in | Wt 267.0 lb

## 2023-07-01 DIAGNOSIS — I2510 Atherosclerotic heart disease of native coronary artery without angina pectoris: Secondary | ICD-10-CM

## 2023-07-01 DIAGNOSIS — E782 Mixed hyperlipidemia: Secondary | ICD-10-CM

## 2023-07-01 DIAGNOSIS — I10 Essential (primary) hypertension: Secondary | ICD-10-CM

## 2023-07-01 DIAGNOSIS — I5020 Unspecified systolic (congestive) heart failure: Secondary | ICD-10-CM

## 2023-07-01 DIAGNOSIS — N185 Chronic kidney disease, stage 5: Secondary | ICD-10-CM

## 2023-07-01 NOTE — Patient Instructions (Addendum)
Medication Instructions:  The current medical regimen is effective;  continue present plan and medications as directed. Please refer to the Current Medication list given to you today.  *If you need a refill on your cardiac medications before your next appointment, please call your pharmacy*   Lab Work: CBC AND BMET TODAY If you have labs (blood work) drawn today and your tests are completely normal, you will receive your results only by:  MyChart Message (if you have MyChart) OR  A paper copy in the mail If you have any lab test that is abnormal or we need to change your treatment, we will call you to review the results.  Other Instructions LEFT HEART CATH ON 07-15-2023      Cardiac Catheterization   You are scheduled for a Cardiac Catheterization on Friday, February 7 with Dr. Alverda Skeans.  1. Please arrive at the [facility] (Main Entrance A) at [facility]: [address] [place], [place] [address] at 5:30 AM (This time is 2 hour(s) before your procedure to ensure your preparation).   Free valet parking service is available. You will check in at ADMITTING. The support person will be asked to wait in the waiting room.  It is OK to have someone drop you off and come back when you are ready to be discharged.        Special note: Every effort is made to have your procedure done on time. Please understand that emergencies sometimes delay scheduled procedures.  2. Diet: Do not eat solid foods after midnight.  You may have clear liquids until 5 AM the day of the procedure.  3. Labs: You will need to have blood drawn on TODAY  4. Medication instructions in preparation for your procedure:  DO NOT TAKE TORSEMIDE THE MORNING OF THE PROCEDURE   On the morning of your procedure, take Plavix/Clopidogrel and any morning medicines NOT listed above.  You may use sips of water.  5. Plan to go home the same day, you will only stay overnight if medically necessary. 6. You MUST have a  responsible adult to drive you home. 7. An adult MUST be with you the first 24 hours after you arrive home. 8. Bring a current list of your medications, and the last time and date medication taken. 9. Bring ID and current insurance cards. 10.Please wear clothes that are easy to get on and off and wear slip-on shoes.  Thank you for allowing [place] to care for you!   -- [facility]   Follow-Up: At [facility], you and your health needs are our priority.  As part of our continuing mission to provide you with exceptional heart care, we have created designated Provider Care Teams.  These Care Teams include your primary Cardiologist (physician) and Advanced Practice Providers (APPs -  Physician Assistants and Nurse Practitioners) who all work together to provide you with the care you need, when you need it.  Your next appointment:   POST CATH  Provider:  Cristal Deer

## 2023-07-02 LAB — BASIC METABOLIC PANEL
BUN/Creatinine Ratio: 8 — ABNORMAL LOW (ref 9–23)
BUN: 33 mg/dL — ABNORMAL HIGH (ref 6–24)
CO2: 24 mmol/L (ref 20–29)
Calcium: 9.8 mg/dL (ref 8.7–10.2)
Chloride: 93 mmol/L — ABNORMAL LOW (ref 96–106)
Creatinine, Ser: 4.05 mg/dL — ABNORMAL HIGH (ref 0.57–1.00)
Glucose: 130 mg/dL — ABNORMAL HIGH (ref 70–99)
Potassium: 5.1 mmol/L (ref 3.5–5.2)
Sodium: 134 mmol/L (ref 134–144)
eGFR: 13 mL/min/{1.73_m2} — ABNORMAL LOW (ref >59)

## 2023-07-02 LAB — CBC
Hematocrit: 39.8 % (ref 34.0–46.6)
Hemoglobin: 13 g/dL (ref 11.1–15.9)
MCH: 30.7 pg (ref 26.6–33.0)
MCHC: 32.7 g/dL (ref 31.5–35.7)
MCV: 94 fL (ref 79–97)
Platelets: 257 10*3/uL (ref 150–450)
RBC: 4.24 x10E6/uL (ref 3.77–5.28)
RDW: 12.5 % (ref 11.7–15.4)
WBC: 6.3 10*3/uL (ref 3.4–10.8)

## 2023-07-09 ENCOUNTER — Other Ambulatory Visit: Payer: Self-pay

## 2023-07-09 ENCOUNTER — Emergency Department (HOSPITAL_COMMUNITY)
Admission: EM | Admit: 2023-07-09 | Discharge: 2023-07-09 | Disposition: A | Discharge status: Home / Self Care | Payer: BC Managed Care – PPO | Attending: Emergency Medicine | Admitting: Emergency Medicine

## 2023-07-09 ENCOUNTER — Encounter (HOSPITAL_COMMUNITY): Payer: Self-pay | Admitting: *Deleted

## 2023-07-09 ENCOUNTER — Emergency Department (HOSPITAL_COMMUNITY): Payer: BC Managed Care – PPO

## 2023-07-09 DIAGNOSIS — R739 Hyperglycemia, unspecified: Secondary | ICD-10-CM

## 2023-07-09 DIAGNOSIS — E1165 Type 2 diabetes mellitus with hyperglycemia: Secondary | ICD-10-CM | POA: Diagnosis not present

## 2023-07-09 DIAGNOSIS — E1122 Type 2 diabetes mellitus with diabetic chronic kidney disease: Secondary | ICD-10-CM | POA: Diagnosis not present

## 2023-07-09 DIAGNOSIS — N186 End stage renal disease: Secondary | ICD-10-CM | POA: Diagnosis not present

## 2023-07-09 DIAGNOSIS — Z20822 Contact with and (suspected) exposure to covid-19: Secondary | ICD-10-CM | POA: Insufficient documentation

## 2023-07-09 DIAGNOSIS — Z992 Dependence on renal dialysis: Secondary | ICD-10-CM | POA: Insufficient documentation

## 2023-07-09 DIAGNOSIS — J069 Acute upper respiratory infection, unspecified: Secondary | ICD-10-CM | POA: Insufficient documentation

## 2023-07-09 DIAGNOSIS — Z794 Long term (current) use of insulin: Secondary | ICD-10-CM | POA: Insufficient documentation

## 2023-07-09 DIAGNOSIS — R0602 Shortness of breath: Secondary | ICD-10-CM | POA: Diagnosis present

## 2023-07-09 LAB — COMPREHENSIVE METABOLIC PANEL
ALT: 21 U/L (ref 0–44)
AST: 19 U/L (ref 15–41)
Albumin: 3.1 g/dL — ABNORMAL LOW (ref 3.5–5.0)
Alkaline Phosphatase: 94 U/L (ref 38–126)
Anion gap: 12 (ref 5–15)
BUN: 36 mg/dL — ABNORMAL HIGH (ref 6–20)
CO2: 23 mmol/L (ref 22–32)
Calcium: 9.3 mg/dL (ref 8.9–10.3)
Chloride: 97 mmol/L — ABNORMAL LOW (ref 98–111)
Creatinine, Ser: 5.2 mg/dL — ABNORMAL HIGH (ref 0.44–1.00)
GFR, Estimated: 9 mL/min — ABNORMAL LOW (ref >60)
Glucose, Bld: 356 mg/dL — ABNORMAL HIGH (ref 70–99)
Potassium: 4.9 mmol/L (ref 3.5–5.1)
Sodium: 132 mmol/L — ABNORMAL LOW (ref 135–145)
Total Bilirubin: 0.7 mg/dL (ref 0.0–1.2)
Total Protein: 6.1 g/dL — ABNORMAL LOW (ref 6.5–8.1)

## 2023-07-09 LAB — CBC
HCT: 35.3 % — ABNORMAL LOW (ref 36.0–46.0)
Hemoglobin: 11.6 g/dL — ABNORMAL LOW (ref 12.0–15.0)
MCH: 31 pg (ref 26.0–34.0)
MCHC: 32.9 g/dL (ref 30.0–36.0)
MCV: 94.4 fL (ref 80.0–100.0)
Platelets: 237 10*3/uL (ref 150–400)
RBC: 3.74 MIL/uL — ABNORMAL LOW (ref 3.87–5.11)
RDW: 13.3 % (ref 11.5–15.5)
WBC: 7.4 10*3/uL (ref 4.0–10.5)
nRBC: 0 % (ref 0.0–0.2)

## 2023-07-09 LAB — RESP PANEL BY RT-PCR (RSV, FLU A&B, COVID)  RVPGX2
Influenza A by PCR: NEGATIVE
Influenza B by PCR: NEGATIVE
Resp Syncytial Virus by PCR: NEGATIVE
SARS Coronavirus 2 by RT PCR: NEGATIVE

## 2023-07-09 LAB — SARS CORONAVIRUS 2 BY RT PCR: SARS Coronavirus 2 by RT PCR: NEGATIVE

## 2023-07-09 LAB — CBG MONITORING, ED
Glucose-Capillary: 350 mg/dL — ABNORMAL HIGH (ref 70–99)
Glucose-Capillary: 57 mg/dL — ABNORMAL LOW (ref 70–99)

## 2023-07-09 LAB — HEPATITIS B SURFACE ANTIGEN: Hepatitis B Surface Ag: NONREACTIVE

## 2023-07-09 LAB — LACTIC ACID, PLASMA
Lactic Acid, Venous: 1.6 mmol/L (ref 0.5–1.9)
Lactic Acid, Venous: 1.4 mmol/L (ref 0.5–1.9)

## 2023-07-09 MED ORDER — LACTATED RINGERS IV BOLUS
500.0000 mL | Freq: Once | INTRAVENOUS | Status: AC
  Administered 2023-07-09: 500 mL via INTRAVENOUS

## 2023-07-09 MED ORDER — HEPARIN SODIUM (PORCINE) 1000 UNIT/ML IJ SOLN
INTRAMUSCULAR | Status: AC
  Filled 2023-07-09: qty 6

## 2023-07-09 MED ORDER — ALTEPLASE 2 MG IJ SOLR
2.0000 mg | Freq: Once | INTRAMUSCULAR | Status: DC | PRN
Start: 2023-07-09 — End: 2023-07-10

## 2023-07-09 MED ORDER — HEPARIN SODIUM (PORCINE) 1000 UNIT/ML DIALYSIS: 1000.0000 [IU] | INTRAMUSCULAR | Status: DC | PRN

## 2023-07-09 MED ORDER — CHLORHEXIDINE GLUCONATE CLOTH 2 % EX PADS
6.0000 | MEDICATED_PAD | Freq: Every day | CUTANEOUS | Status: DC
Start: 2023-07-10 — End: 2023-07-10

## 2023-07-09 NOTE — ED Notes (Signed)
 Pt transported to dialysis

## 2023-07-09 NOTE — ED Triage Notes (Signed)
Pt with SOB and dizziness since yesterday.  Pt states her BP have been low.  Pt states she is suppose to have dialysis this morning. Denies any pain.

## 2023-07-09 NOTE — Discharge Instructions (Signed)
We have been able to get you your dialysis, you need to follow-up with your doctor in the next couple of days, ER for worsening symptoms

## 2023-07-09 NOTE — Progress Notes (Signed)
Was called by the ER for this dialysis patient who was sent to the ER by her dialysis unit for sob.   Work up in ER-  CXR possibly consistent with mild edema- WBC normal-  not hypoxic  The dialysis RN is very busy with other treatments today and tomorrow but says she will try to squeeze in later-  might be overnight-  will write orders for a short treatment so that she can make it to her next HD on Tuesday.  Hoping that is that she will not need to stay in the hospital for other reason once her work up is done   [employer]

## 2023-07-09 NOTE — ED Provider Notes (Signed)
[place] EMERGENCY DEPARTMENT AT [facility] Provider Note   CSN: [id number] Arrival date & time: 07/09/23  1116     History  Chief Complaint  Patient presents with   Shortness of Breath    Jasmine [name] is a 53 y.o. female.  HPI   53 yo female esrd on dialysis, t/th/Saturday presents with cough congestion sinus drainage that began this week. She went to dialysis today and told to come to ED. No fever, some chills or hot flashes.  No sick contacts and had flu shot.  Covid test negative home test today.  BS this am 201 and over 400 coming in 350 at triage.  Insulin pump in and working and took bolus as per app.  BP 108/40 this am, no dialysis.Ate bagel this am. Dialysis sent because bs high and dizzy with relatively low bp   Home Medications Prior to Admission medications   Medication Sig Start Date End Date Taking? Authorizing Provider  albuterol (VENTOLIN HFA) 108 (90 Base) MCG/ACT inhaler Inhale 1-2 puffs into the lungs every 6 (six) hours as needed for wheezing or shortness of breath. 04/07/23   Oretha Milch, MD  buPROPion (WELLBUTRIN XL) 300 MG 24 hr tablet Take 1 tablet (300 mg total) by mouth daily. 03/23/23 09/19/23  Neysa Hotter, MD  carvedilol (COREG) 12.5 MG tablet Take 12.5 mg by mouth 2 (two) times daily with a meal. 01/17/23 01/17/24  [provider]  Cholecalciferol 50 MCG (2000 UT) TABS Take 1 tablet by mouth daily.  07/26/18   [provider]  clopidogrel (PLAVIX) 75 MG tablet Take 1 tablet (75 mg total) by mouth daily. 02/22/23   Alver Sorrow, NP  Continuous Blood Gluc Sensor MISC 1 each by Does not apply route as directed. Use as directed every 14 days. May dispense FreeStyle [employer] or similar.    [provider]  DULoxetine (CYMBALTA) 30 MG capsule Take 1 capsule (30 mg total) by mouth daily. 07/03/23 10/31/23  Neysa Hotter, MD  folic acid (FOLVITE) 1 MG tablet Take 1 mg by mouth daily.    [provider]  glucagon (GLUCAGEN HYPOKIT) 1 MG SOLR injection Inject 1 mg into the skin once as needed for up to 1 dose for low blood sugar. GlucaGen HypoKit 1 mg Injection 03/31/23   Johnson, Clanford L, MD  hydrALAZINE (APRESOLINE) 10 MG tablet Take 1 tablet (10 mg total) by mouth every 8 (eight) hours. Patient not taking: Reported on 07/01/2023 09/12/21   Catarina Hartshorn, MD  hydrALAZINE (APRESOLINE) 25 MG tablet Take by mouth. 05/03/23   [provider]  lamoTRIgine (LAMICTAL) 200 MG tablet Take 1 tablet (200 mg total) by mouth daily. 04/24/23 10/21/23  Neysa Hotter, MD  levothyroxine (SYNTHROID) 175 MCG tablet TAKE ONE TABLET BY MOUTH DAILY AT 6am 11/20/21   [provider]  LORazepam (ATIVAN) 0.5 MG tablet Take 1 tablet (0.5 mg total) by mouth daily as needed for anxiety. 07/04/23 10/02/23  Neysa Hotter, MD  Melatonin 10 MG TABS Take 1 tablet by mouth at bedtime.    [provider]  NOVOLOG 100 UNIT/ML injection Inject into the skin continuous. Sliding Scale 03/15/22   [provider]  Omega-3 Fatty Acids (FISH OIL PO) Take 1 capsule by mouth daily.    [provider]  ondansetron (ZOFRAN ODT) 4 MG disintegrating tablet Take 1 tablet (4 mg total) by mouth every 8 (eight) hours as needed. 03/23/21   Levert Feinstein, MD  oxyCODONE-acetaminophen (PERCOCET/ROXICET) 5-325 MG tablet Take by mouth 4 (four) times daily as needed for severe pain.    [provider]  OXYGEN Inhale 3 L into the lungs as needed.    [provider]  pravastatin (PRAVACHOL) 80 MG tablet Take 80 mg by mouth at bedtime. 09/04/15   [provider]  rizatriptan (MAXALT-MLT) 5 MG disintegrating tablet Take 5 mg by mouth as needed for migraine. May repeat in 2 hours if needed    [provider]  Torsemide 40 MG TABS Take 40 mg by mouth daily. 03/31/23   Johnson, Clanford L, MD      Allergies    Ciprofloxacin, Levaquin [levofloxacin], Buspar [buspirone],  Linaclotide, Advair diskus [fluticasone-salmeterol], Biaxin [clarithromycin], and Hydroxyzine    Review of Systems   Review of Systems  Physical Exam Updated Vital Signs BP (!) 146/58   Pulse 75   Temp 97.9 F (36.6 C) (Oral)   Resp 18   Ht 1.753 m (5\' 9" )   Wt 116.6 kg   LMP 03/23/2017 (Approximate)   SpO2 100%   BMI 37.95 kg/m  Physical Exam  ED Results / Procedures / Treatments   Labs (all labs ordered are listed, but only abnormal results are displayed) Labs Reviewed  CBC - Abnormal; Notable for the following components:      Result Value   RBC 3.74 (*)    Hemoglobin 11.6 (*)    HCT 35.3 (*)    All other components within normal limits  COMPREHENSIVE METABOLIC PANEL - Abnormal; Notable for the following components:   Sodium 132 (*)    Chloride 97 (*)    Glucose, Bld 356 (*)    BUN 36 (*)    Creatinine, Ser 5.20 (*)    Total Protein 6.1 (*)    Albumin 3.1 (*)    GFR, Estimated 9 (*)    All other components within normal limits  CBG MONITORING, ED - Abnormal; Notable for the following components:   Glucose-Capillary 350 (*)    All other components within normal limits  SARS CORONAVIRUS 2 BY RT PCR  CULTURE, BLOOD (ROUTINE X 2)  CULTURE, BLOOD (ROUTINE X 2)  RESP PANEL BY RT-PCR (RSV, FLU A&B, COVID)  [id number]  LACTIC ACID, PLASMA  LACTIC ACID, PLASMA  HEPATITIS B SURFACE ANTIGEN  HEPATITIS B SURFACE ANTIBODY, QUANTITATIVE    EKG EKG Interpretation Date/Time:  Saturday July 09 2023 11:41:17 EST Ventricular Rate:  67 PR Interval:    QRS Duration:  98 QT Interval:  422 QTC Calculation: 445 R Axis:   39  Text Interpretation: Undetermined rhythm Septal infarct (cited on or before 01-Jul-2023) Abnormal ECG When compared with ECG of 01-Jul-2023 15:04, Current undetermined rhythm precludes rhythm comparison, needs review T wave inversion no longer evident in Lateral leads Confirmed by Margarita Grizzle [id number]) on 07/09/2023 12:14:23 PM  Radiology DG Chest 2  View Result Date: 07/09/2023 CLINICAL DATA:  Shortness of breath and dizziness EXAM: CHEST - 2 VIEW COMPARISON:  03/30/2023 FINDINGS: Right IJ dialysis catheter noted, distal tip at the right atrium. Hazy suprahilar confluent interstitial opacity potentially from mild edema, less likely atypical pneumonia. Low lung volumes are present, causing crowding of the pulmonary vasculature. IMPRESSION: 1. Hazy suprahilar confluent interstitial opacity potentially from mild edema, less likely atypical pneumonia. 2. Low lung volumes. 3. Right IJ dialysis catheter. Electronically Signed   By: Gaylyn Rong M.D.   On: 07/09/2023 12:28    Procedures Procedures    Medications Ordered in  ED Medications  Chlorhexidine Gluconate Cloth 2 % PADS 6 each (has no administration in time range)  lactated ringers bolus 500 mL (500 mLs Intravenous Bolus 07/09/23 1302)    ED Course/ Medical Decision Making/ A&P                                 Medical Decision Making Amount and/or Complexity of Data Reviewed Labs: ordered. Radiology: ordered.    3:47 PM Discussed with Dr. Kathrene Bongo on-call for nephrology  53 year old female end-stage renal disease on dialysis, diabetes, presents today with nasal congestion, cough, generalized lysed she was due for dialysis today went to dialysis and was told that she should come to the ED.  She had some dizziness there.  She felt a little lightheaded reports that she was hypotensive there.  She reports that her blood sugar was high and she bolused herself per her app.  1-Generalized malaise, chills, nasal congestion.  Initial COVID negative have reordered her with appropriate flu and RSV. Chest x-Jennet Scroggin obtained and hazy suprahilar opacities that could represent some edema yet although less likely atypical pneumonia.  White blood cell count is normal.  Oxygen saturations normal respiratory rate normal.  Doubt acute This will need to be reevaluated after dialysis 2  hyperglycemia patient with blood sugar 356 here.  She received some IV fluids.  Will need reevaluation after dialysis  Care discussed with Dr. Hyacinth Meeker who will reevaluate after dialysis        Final Clinical Impression(s) / ED Diagnoses Final diagnoses:  Upper respiratory tract infection, unspecified type  ESRD (end stage renal disease) (HCC)  Hyperglycemia    Rx / DC Orders ED Discharge Orders     None         Margarita Grizzle, MD 07/09/23 1547

## 2023-07-09 NOTE — Progress Notes (Signed)
   HEMODIALYSIS TREATMENT NOTE:   Uneventful 2.5 hour heparin-free treatment completed using right internal jugular tDC. Cath exit site is unremarkable. Net UF 1.2 Liters.  All blood was returned.    07/09/23 2145  Vitals  Temp 98 F (36.7 C)  Temp Source Oral  BP (!) 155/56  MAP (mmHg) 84  BP Location Left Arm  BP Method Automatic  Patient Position (if appropriate) Lying  Pulse Rate 68  Pulse Rate Source Monitor  ECG Heart Rate 67  Resp 18  Oxygen Therapy  SpO2 98 %  O2 Device Room Air  Hepatitis B Pre Treatment Patient Checks  Hepatitis B Surface Antigen Results Pending (labs drawn, awaiting results)  Date Hepatitis B Surface Antigen Drawn 07/09/23  Hep B Antibody Quant/Post still pending  Date Hep B Antibody Quant/Post Drawn 07/09/23  Patient's Immunity Status Pending (labs drawn, awaiting results)  Isolation Initiated Unknown Hepatitis status  Post Treatment  Dialyzer Clearance Lightly streaked  Hemodialysis Intake (mL) 0 mL  Liters Processed 51.4  Fluid Removed (mL) 1200 mL  Tolerated HD Treatment Yes  Post-Hemodialysis Comments 1.2 L removed.  Hemodynamically stable  Hemodialysis Catheter Right Internal jugular Double lumen Permanent (Tunneled)  No placement date or time found.   Placed prior to admission: Yes  Orientation: Right  Access Location: Internal jugular  Hemodialysis Catheter Type: Double lumen Permanent (Tunneled)  Site Condition No complications  Blue Lumen Status Flushed;Heparin locked;Dead end cap in place  Red Lumen Status Flushed;Heparin locked;Dead end cap in place  Purple Lumen Status N/A  Catheter fill solution Heparin 1000 units/ml  Catheter fill volume (Arterial) 2 cc  Catheter fill volume (Venous) 2.1  Dressing Type Transparent;Tube stabilization device  Dressing Status Antimicrobial disc/dressing in place;Clean, Dry, Intact  Interventions New dressing  Drainage Description None  Dressing Change Due 07/16/23  Post treatment catheter  status Capped and Clamped    Pt was transported back to ED to await disposition.   Arman Filter, RN AP [facility]

## 2023-07-10 LAB — HEPATITIS B SURFACE ANTIBODY, QUANTITATIVE: Hep B S AB Quant (Post): <3.5 m[IU]/mL — ABNORMAL LOW

## 2023-07-11 ENCOUNTER — Encounter (HOSPITAL_BASED_OUTPATIENT_CLINIC_OR_DEPARTMENT_OTHER): Payer: Self-pay

## 2023-07-11 LAB — CBG MONITORING, ED: Glucose-Capillary: 90 mg/dL (ref 70–99)

## 2023-07-11 NOTE — Telephone Encounter (Signed)
Called pt and discussed Left heart cath directions. She will hold her Torsemide. Pt states that she threw away her AVS that was given to her that was given to her at last visit 07-01-2023 this is why she said that she didn't get CATH directions. She will hold her Torsemide the morning of the procedure. She will take all other medications.  Diet: Do not eat solid foods after midnight.  You may have clear liquids until 5 AM the day of the procedure.  Pt verbalized understanding of all the above directions.

## 2023-07-12 ENCOUNTER — Other Ambulatory Visit: Payer: Self-pay

## 2023-07-12 DIAGNOSIS — I10 Essential (primary) hypertension: Secondary | ICD-10-CM

## 2023-07-12 DIAGNOSIS — I2510 Atherosclerotic heart disease of native coronary artery without angina pectoris: Secondary | ICD-10-CM

## 2023-07-12 DIAGNOSIS — I5020 Unspecified systolic (congestive) heart failure: Secondary | ICD-10-CM

## 2023-07-12 DIAGNOSIS — N185 Chronic kidney disease, stage 5: Secondary | ICD-10-CM

## 2023-07-12 DIAGNOSIS — E782 Mixed hyperlipidemia: Secondary | ICD-10-CM

## 2023-07-12 NOTE — Telephone Encounter (Signed)
Addressed via separate encounter.   Alver Sorrow, NP

## 2023-07-14 ENCOUNTER — Telehealth: Payer: Self-pay | Admitting: *Deleted

## 2023-07-14 LAB — CULTURE, BLOOD (ROUTINE X 2): Special Requests: ADEQUATE

## 2023-07-14 NOTE — Telephone Encounter (Signed)
 Cardiac Catheterization scheduled at [facility] for: Friday July 15, 2023 7:30 AM Arrival time [facility] Main Entrance A at: 5:30 AM  Nothing to eat after midnight prior to procedure, clear liquids until 5 AM day of procedure.  Medication instructions: -Hold:  Insulin  pump-pt will manage  Torsemide -AM of procedure -Other usual morning medications can be taken with sips of water  including aspirin  81 mg and Plavix  75 mg  Plan to go home the same day, you will only stay overnight if medically necessary.  You must have responsible adult to drive you home.  Someone must be with you the first 24 hours after you arrive home.  Reviewed procedure instructions with patient. TTS dialysis schedule.

## 2023-07-15 ENCOUNTER — Ambulatory Visit (HOSPITAL_BASED_OUTPATIENT_CLINIC_OR_DEPARTMENT_OTHER): Payer: BC Managed Care – PPO

## 2023-07-15 ENCOUNTER — Other Ambulatory Visit: Payer: Self-pay

## 2023-07-15 ENCOUNTER — Encounter (HOSPITAL_COMMUNITY): Payer: Self-pay | Admitting: Internal Medicine

## 2023-07-15 ENCOUNTER — Encounter (HOSPITAL_COMMUNITY)
Admission: RE | Disposition: A | Discharge status: Home / Self Care | Payer: BC Managed Care – PPO | Source: Home / Self Care | Attending: Internal Medicine

## 2023-07-15 ENCOUNTER — Ambulatory Visit (HOSPITAL_COMMUNITY)
Admission: RE | Admit: 2023-07-15 | Discharge: 2023-07-15 | Disposition: A | Discharge status: Home / Self Care | Payer: BC Managed Care – PPO | Attending: Internal Medicine | Admitting: Internal Medicine

## 2023-07-15 DIAGNOSIS — N186 End stage renal disease: Secondary | ICD-10-CM | POA: Diagnosis not present

## 2023-07-15 DIAGNOSIS — Z87891 Personal history of nicotine dependence: Secondary | ICD-10-CM | POA: Diagnosis not present

## 2023-07-15 DIAGNOSIS — G4733 Obstructive sleep apnea (adult) (pediatric): Secondary | ICD-10-CM | POA: Insufficient documentation

## 2023-07-15 DIAGNOSIS — I272 Pulmonary hypertension, unspecified: Secondary | ICD-10-CM | POA: Diagnosis not present

## 2023-07-15 DIAGNOSIS — E1022 Type 1 diabetes mellitus with diabetic chronic kidney disease: Secondary | ICD-10-CM | POA: Insufficient documentation

## 2023-07-15 DIAGNOSIS — J9611 Chronic respiratory failure with hypoxia: Secondary | ICD-10-CM | POA: Insufficient documentation

## 2023-07-15 DIAGNOSIS — Z79899 Other long term (current) drug therapy: Secondary | ICD-10-CM | POA: Insufficient documentation

## 2023-07-15 DIAGNOSIS — J45909 Unspecified asthma, uncomplicated: Secondary | ICD-10-CM | POA: Diagnosis not present

## 2023-07-15 DIAGNOSIS — I2510 Atherosclerotic heart disease of native coronary artery without angina pectoris: Secondary | ICD-10-CM | POA: Insufficient documentation

## 2023-07-15 DIAGNOSIS — I132 Hypertensive heart and chronic kidney disease with heart failure and with stage 5 chronic kidney disease, or end stage renal disease: Secondary | ICD-10-CM | POA: Insufficient documentation

## 2023-07-15 DIAGNOSIS — Z01810 Encounter for preprocedural cardiovascular examination: Secondary | ICD-10-CM

## 2023-07-15 DIAGNOSIS — E785 Hyperlipidemia, unspecified: Secondary | ICD-10-CM | POA: Insufficient documentation

## 2023-07-15 DIAGNOSIS — R9439 Abnormal result of other cardiovascular function study: Secondary | ICD-10-CM | POA: Diagnosis not present

## 2023-07-15 DIAGNOSIS — I5022 Chronic systolic (congestive) heart failure: Secondary | ICD-10-CM

## 2023-07-15 DIAGNOSIS — Z7902 Long term (current) use of antithrombotics/antiplatelets: Secondary | ICD-10-CM | POA: Insufficient documentation

## 2023-07-15 DIAGNOSIS — Z992 Dependence on renal dialysis: Secondary | ICD-10-CM | POA: Insufficient documentation

## 2023-07-15 DIAGNOSIS — I5042 Chronic combined systolic (congestive) and diastolic (congestive) heart failure: Secondary | ICD-10-CM | POA: Diagnosis not present

## 2023-07-15 DIAGNOSIS — Z794 Long term (current) use of insulin: Secondary | ICD-10-CM | POA: Diagnosis not present

## 2023-07-15 HISTORY — PX: LEFT HEART CATH AND CORONARY ANGIOGRAPHY: CATH118249

## 2023-07-15 LAB — ECHOCARDIOGRAM COMPLETE
AR max vel: 2.44 cm2
AV Area VTI: 2.37 cm2
AV Area mean vel: 2.42 cm2
AV Mean grad: 3.5 mm[Hg]
AV Peak grad: 6.2 mm[Hg]
Ao pk vel: 1.24 m/s
Area-P 1/2: 2.73 cm2
Est EF: 55
Height: 69 in
S' Lateral: 3.4 cm
Weight: 4112 [oz_av]

## 2023-07-15 LAB — BASIC METABOLIC PANEL
Anion gap: 12 (ref 5–15)
BUN: 17 mg/dL (ref 6–20)
CO2: 25 mmol/L (ref 22–32)
Calcium: 9.5 mg/dL (ref 8.9–10.3)
Chloride: 100 mmol/L (ref 98–111)
Creatinine, Ser: 3.46 mg/dL — ABNORMAL HIGH (ref 0.44–1.00)
GFR, Estimated: 15 mL/min — ABNORMAL LOW (ref >60)
Glucose, Bld: 77 mg/dL (ref 70–99)
Potassium: 4 mmol/L (ref 3.5–5.1)
Sodium: 137 mmol/L (ref 135–145)

## 2023-07-15 LAB — GLUCOSE, CAPILLARY
Glucose-Capillary: 71 mg/dL (ref 70–99)
Glucose-Capillary: 97 mg/dL (ref 70–99)

## 2023-07-15 SURGERY — LEFT HEART CATH AND CORONARY ANGIOGRAPHY
Anesthesia: LOCAL

## 2023-07-15 MED ORDER — HEPARIN (PORCINE) IN NACL 1000-0.9 UT/500ML-% IV SOLN
INTRAVENOUS | Status: DC | PRN
  Administered 2023-07-15: 1000 mL

## 2023-07-15 MED ORDER — CLOPIDOGREL BISULFATE 75 MG PO TABS
75.0000 mg | ORAL_TABLET | Freq: Once | ORAL | Status: AC
  Administered 2023-07-15: 75 mg via ORAL
  Filled 2023-07-15: qty 1

## 2023-07-15 MED ORDER — FENTANYL CITRATE (PF) 100 MCG/2ML IJ SOLN
INTRAMUSCULAR | Status: AC
  Filled 2023-07-15: qty 2

## 2023-07-15 MED ORDER — FENTANYL CITRATE (PF) 100 MCG/2ML IJ SOLN
INTRAMUSCULAR | Status: DC | PRN
  Administered 2023-07-15: 25 ug via INTRAVENOUS

## 2023-07-15 MED ORDER — HEPARIN SODIUM (PORCINE) 1000 UNIT/ML IJ SOLN
INTRAMUSCULAR | Status: DC | PRN
  Administered 2023-07-15: 5000 [IU] via INTRA_ARTERIAL

## 2023-07-15 MED ORDER — HEPARIN SODIUM (PORCINE) 1000 UNIT/ML IJ SOLN
INTRAMUSCULAR | Status: AC
  Filled 2023-07-15: qty 10

## 2023-07-15 MED ORDER — VERAPAMIL HCL 2.5 MG/ML IV SOLN
INTRAVENOUS | Status: DC | PRN
  Administered 2023-07-15: 5 mL via INTRA_ARTERIAL

## 2023-07-15 MED ORDER — MIDAZOLAM HCL 2 MG/2ML IJ SOLN
INTRAMUSCULAR | Status: DC | PRN
  Administered 2023-07-15: 1 mg via INTRAVENOUS

## 2023-07-15 MED ORDER — SODIUM CHLORIDE 0.9 % IV SOLN: INTRAVENOUS | Status: DC

## 2023-07-15 MED ORDER — VERAPAMIL HCL 2.5 MG/ML IV SOLN
INTRAVENOUS | Status: AC
  Filled 2023-07-15: qty 2

## 2023-07-15 MED ORDER — MIDAZOLAM HCL 2 MG/2ML IJ SOLN
INTRAMUSCULAR | Status: AC
  Filled 2023-07-15: qty 2

## 2023-07-15 MED ORDER — IOHEXOL 350 MG/ML SOLN
INTRAVENOUS | Status: DC | PRN
  Administered 2023-07-15: 38 mL

## 2023-07-15 MED ORDER — ASPIRIN 81 MG PO CHEW
81.0000 mg | CHEWABLE_TABLET | ORAL | Status: AC
  Administered 2023-07-15: 81 mg via ORAL
  Filled 2023-07-15: qty 1

## 2023-07-15 MED ORDER — LIDOCAINE HCL (PF) 1 % IJ SOLN
INTRAMUSCULAR | Status: AC
  Filled 2023-07-15: qty 30

## 2023-07-15 MED ORDER — LIDOCAINE HCL (PF) 1 % IJ SOLN
INTRAMUSCULAR | Status: DC | PRN
  Administered 2023-07-15: 2 mL

## 2023-07-15 SURGICAL SUPPLY — 12 items
CATH INFINITI 5FR ANG PIGTAIL (CATHETERS) IMPLANT
CATH INFINITI AMBI 6FR TG (CATHETERS) IMPLANT
CATH INFINITI JR4 5F (CATHETERS) IMPLANT
DEVICE RAD COMP TR BAND LRG (VASCULAR PRODUCTS) IMPLANT
GLIDESHEATH SLEND SS 6F .021 (SHEATH) IMPLANT
GUIDEWIRE INQWIRE 1.5J.035X260 (WIRE) IMPLANT
INQWIRE 1.5J .035X260CM (WIRE) ×1 IMPLANT
KIT SYRINGE INJ CVI SPIKEX1 (MISCELLANEOUS) IMPLANT
PACK CARDIAC CATHETERIZATION (CUSTOM PROCEDURE TRAY) ×1 IMPLANT
PROTECTION STATION PRESSURIZED (MISCELLANEOUS) ×1 IMPLANT
STATION PROTECTION PRESSURIZED (MISCELLANEOUS) IMPLANT
WIRE EMERALD 3MM-J .035X260CM (WIRE) IMPLANT

## 2023-07-15 NOTE — Interval H&P Note (Signed)
 History and Physical Interval Note:  07/15/2023 6:48 AM  Jasmine [name]  has presented today for surgery, with the diagnosis of abnormal pet scan.  The various methods of treatment have been discussed with the patient and family. After consideration of risks, benefits and other options for treatment, the patient has consented to  Procedure(s): LEFT HEART CATH AND CORONARY ANGIOGRAPHY (N/A) as a surgical intervention.  The patient's history has been reviewed, patient examined, no change in status, stable for surgery.  I have reviewed the patient's chart and labs.  Questions were answered to the patient's satisfaction.     Jasmine [name]

## 2023-07-15 NOTE — Progress Notes (Signed)
*  PRELIMINARY RESULTS* Echocardiogram 2D Echocardiogram has been performed.  Jasmine [name] 07/15/2023, 10:35 AM

## 2023-07-21 ENCOUNTER — Encounter: Payer: BC Managed Care – PPO | Admitting: Thoracic Surgery (Cardiothoracic Vascular Surgery)

## 2023-07-22 ENCOUNTER — Institutional Professional Consult (permissible substitution) (INDEPENDENT_AMBULATORY_CARE_PROVIDER_SITE_OTHER): Payer: BC Managed Care – PPO | Admitting: Thoracic Surgery (Cardiothoracic Vascular Surgery)

## 2023-07-22 VITALS — BP 110/67 | HR 64 | Resp 18 | Ht 69.0 in | Wt 257.0 lb

## 2023-07-22 DIAGNOSIS — I2510 Atherosclerotic heart disease of native coronary artery without angina pectoris: Secondary | ICD-10-CM

## 2023-07-22 NOTE — Progress Notes (Signed)
[address]       [place] [address]             [phone]        AMMIE WARRICK [facility] Medical Record #[id number] Date of Birth: [birth year]  Referring: Orbie Pyo, MD Primary Care: Richmond Campbell., PA-C Primary Cardiologist:Bridgette Cristal Deer, MD  Chief Complaint:    Chief Complaint  Patient presents with   Coronary Artery Disease    Cath 2/7, ECHO 2/4    History of Present Illness:     Jasmine [name] 53 y.o. female presents for surgical evaluation of two-vessel coronary artery disease.  In August 2024 she was admitted to [facility] with congestive heart failure, and renal insufficiency.  Since that point she has been wheelchair dependent and on dialysis.  She recently had a left heart cath which showed two-vessel disease.  In regards to her symptoms she only has some shortness of breath, but denies any anginal symptoms.  She does have some reflux but this is not associated with exertion.   Creatinine:  Lab Results  Component Value Date   CREATININE 3.46 (H) 07/15/2023   CREATININE 5.20 (H) 07/09/2023   CREATININE 4.05 (H) 07/01/2023     Past Medical History:  Diagnosis Date   Anxiety    Arthritis    Asthma    Balance problems    Bipolar disorder (HCC)    Charcot ankle    Chronic fatigue    Chronic kidney disease    STAGE 3-4   Depression    Diabetes mellitus    DKA, type 1 (HCC) 11/04/2011   Elevated cholesterol    Fibromyalgia    GERD (gastroesophageal reflux disease)    Headache    History of suicidal ideation    Hyperlipemia    Hypertension    Hypothyroidism    IBS (irritable bowel syndrome)    Memory changes    Obesity    Sleep apnea    HAS C -PAP / DOES NOT USE   Stress incontinence    Pt had surgery to correct this.   Tachycardia    Tobacco abuse    Tremor    UTI (lower urinary tract infection)     Past Surgical History:  Procedure Laterality Date   INCONTINENCE SURGERY     LEFT HEART CATH  AND CORONARY ANGIOGRAPHY N/A 07/15/2023   Procedure: LEFT HEART CATH AND CORONARY ANGIOGRAPHY;  Surgeon: Orbie Pyo, MD;  Location: [facility] INVASIVE [facility];  Service: Cardiovascular;  Laterality: N/A;   NASAL FRACTURE SURGERY     ovary removed     OVARY SURGERY     PUBOVAGINAL SLING  08/16/2011   Procedure: Leonides Grills;  Surgeon: Valetta Fuller, MD;  Location: [facility] ORS;  Service: Urology;  Laterality: N/A;          UTERINE FIBROID SURGERY  2001    Social History:  Social History   Tobacco Use  Smoking Status Former   Current packs/day: 0.00   Average packs/day: 0.8 packs/day for 20.0 years (15.0 ttl pk-yrs)   Types: Cigarettes   Start date: 06/08/1991   Quit date: 06/08/2011   Years since quitting: 12.1  Smokeless Tobacco Never    Social History   Substance and Sexual Activity  Alcohol Use No     Allergies  Allergen Reactions   Ciprofloxacin Swelling and Other (See Comments)    Per pt caused lips swell and nauseous feeling  Levaquin [Levofloxacin] Swelling and Other (See Comments)    Per pt caused lips swell and nauseous feeling   Buspar [Buspirone] Other (See Comments)    abd cramping   Linaclotide Other (See Comments)   Advair Diskus [Fluticasone-Salmeterol] Other (See Comments)    Thrush    Biaxin [Clarithromycin] Rash   Hydroxyzine Palpitations      Current Outpatient Medications  Medication Sig Dispense Refill   albuterol (VENTOLIN HFA) 108 (90 Base) MCG/ACT inhaler Inhale 1-2 puffs into the lungs every 6 (six) hours as needed for wheezing or shortness of breath. 6.7 g 1   Ascorbic Acid (VITAMIN C) 1000 MG tablet Take 1,000 mg by mouth daily.     aspirin EC 81 MG tablet Take 81 mg by mouth daily. Swallow whole.     buPROPion (WELLBUTRIN XL) 300 MG 24 hr tablet Take 1 tablet (300 mg total) by mouth daily. 30 tablet 5   carvedilol (COREG) 12.5 MG tablet Take 12.5 mg by mouth 2 (two) times daily with a meal.     Cholecalciferol 50 MCG (2000 UT) TABS Take  2,000 Units by mouth in the morning.     clopidogrel (PLAVIX) 75 MG tablet Take 1 tablet (75 mg total) by mouth daily. 30 tablet 5   Continuous Blood Gluc Sensor MISC 1 each by Does not apply route as directed. Use as directed every 14 days. May dispense FreeStyle [employer] or similar.     DULoxetine (CYMBALTA) 30 MG capsule Take 1 capsule (30 mg total) by mouth daily. 30 capsule 3   folic acid (FOLVITE) 1 MG tablet Take 1 mg by mouth daily.     glucagon (GLUCAGEN HYPOKIT) 1 MG SOLR injection Inject 1 mg into the skin once as needed for up to 1 dose for low blood sugar. GlucaGen HypoKit 1 mg Injection 1 each 3   hydrALAZINE (APRESOLINE) 25 MG tablet Take 25 mg by mouth 2 (two) times daily.     lamoTRIgine (LAMICTAL) 200 MG tablet Take 1 tablet (200 mg total) by mouth daily. 30 tablet 5   levothyroxine (SYNTHROID) 175 MCG tablet TAKE ONE TABLET BY MOUTH DAILY AT 6am     LORazepam (ATIVAN) 0.5 MG tablet Take 1 tablet (0.5 mg total) by mouth daily as needed for anxiety. (Patient taking differently: Take 0.5 mg by mouth daily as needed for anxiety or sleep.) 30 tablet 2   Melatonin 10 MG TABS Take 10 mg by mouth at bedtime.     NOVOLOG 100 UNIT/ML injection Inject into the skin continuous. Sliding Scale Insulin pump 2.5 units basal Bolus with meal depending on the size     OVER THE COUNTER MEDICATION Apply 1 application  topically at bedtime as needed (Sleep). CBD oil     oxyCODONE-acetaminophen (PERCOCET/ROXICET) 5-325 MG tablet Take 1 tablet by mouth 4 (four) times daily as needed for severe pain (pain score 7-10).     OXYGEN Inhale 3 L into the lungs at bedtime.     pravastatin (PRAVACHOL) 80 MG tablet Take 80 mg by mouth at bedtime.     rizatriptan (MAXALT-MLT) 5 MG disintegrating tablet Take 5 mg by mouth as needed for migraine. May repeat in 2 hours if needed     sodium chloride (OCEAN) 0.65 % SOLN nasal spray Place 1 spray into both nostrils daily as needed for congestion.      Torsemide 40 MG TABS Take 40 mg by mouth daily. (Patient taking differently: Take 40 mg by mouth See admin  instructions. Take on Mon., Wed., Fri., and Sunday)     No current facility-administered medications for this visit.    (Not in a hospital admission)   Family History  Problem Relation Age of Onset   Asthma Mother    Bipolar disorder Mother    Heart disease Father    Lymphoma Father    Hypertension Father    Thyroid disease Father    Hyperlipidemia Father    Diabetes Father    Cancer Paternal Grandmother        lung and breast   Bladder Cancer Paternal Grandfather    Suicidality Maternal Grandfather    Thyroid disease Brother      Review of Systems:   Review of Systems  Constitutional:  Positive for malaise/fatigue.  Respiratory:  Positive for shortness of breath.   Cardiovascular:  Negative for chest pain.  Gastrointestinal:  Positive for heartburn. Negative for nausea.  Neurological:  Positive for weakness.      Physical Exam: BP 110/67 (BP Location: Left Arm)   Pulse 64   Resp 18   Ht 5\' 9"  (1.753 m)   Wt 257 lb (116.6 kg)   LMP 03/23/2017 (Approximate)   SpO2 97% Comment: RA  BMI 37.95 kg/m  Physical Exam Constitutional:      General: She is not in acute distress.    Appearance: She is not ill-appearing.  HENT:     Head: Normocephalic and atraumatic.  Eyes:     Extraocular Movements: Extraocular movements intact.  Cardiovascular:     Rate and Rhythm: Normal rate.  Pulmonary:     Effort: Pulmonary effort is normal. No respiratory distress.  Musculoskeletal:     Cervical back: Normal range of motion.     Comments: Presents in a wheelchair.  Skin:    General: Skin is warm and dry.  Neurological:     General: No focal deficit present.     Mental Status: She is alert and oriented to person, place, and time.       Diagnostic Studies & Laboratory data:    Left Heart Catherization:  Intervention Echo: IMPRESSIONS     1. Left ventricular  ejection fraction, by estimation, is 55%. The left  ventricle has normal function. The left ventricle has no regional wall  motion abnormalities. Left ventricular diastolic parameters are consistent  with Grade I diastolic dysfunction  (impaired relaxation).   2. Right ventricular systolic function is normal. The right ventricular  size is normal. There is normal pulmonary artery systolic pressure. The  estimated right ventricular systolic pressure is 26.6 mmHg.   3. The mitral valve is normal in structure. No evidence of mitral valve  regurgitation. No evidence of mitral stenosis.   4. The aortic valve is tricuspid. Aortic valve regurgitation is not  visualized. No aortic stenosis is present.   5. The inferior vena cava is normal in size with greater than 50%  respiratory variability, suggesting right atrial pressure of 3 mmHg.   6. Technically difficult study with poor acoustic windows.     I have independently reviewed the above radiologic studies and discussed with the patient   Recent Lab Findings: Lab Results  Component Value Date   WBC 7.4 07/09/2023   HGB 11.6 (L) 07/09/2023   HCT 35.3 (L) 07/09/2023   PLT 237 07/09/2023   GLUCOSE 77 07/15/2023   CHOL 212 (H) 09/12/2021   TRIG 138 09/12/2021   HDL 66 09/12/2021   LDLCALC 118 (H) 09/12/2021   ALT 21  07/09/2023   AST 19 07/09/2023   NA 137 07/15/2023   K 4.0 07/15/2023   CL 100 07/15/2023   CREATININE 3.46 (H) 07/15/2023   BUN 17 07/15/2023   CO2 25 07/15/2023   TSH 0.018 (L) 03/29/2023   HGBA1C 5.6 03/28/2023      Assessment / Plan:   53 year old female with two-vessel coronary artery disease.  She also has a history of congestive heart failure, but on her most recent echocardiogram her LV function is back up to 55%.  From a symptomatic standpoint she only complains of some shortness of breath but denies any chest pain.  She does have a history of COPD and is currently on dialysis.  Additionally she is wheelchair  dependent for long distances and is quite frail.  On review of her left heart cath her LAD is diffusely diseased, and likely would not be a good target.  If aorta bypass is likely would place a vein graft on this.  The diagonal branch is a better target, and would be more suitable for LIMA.  She also has a good target in her distal RCA/PDA.  Given her current functional status and lack of anginal symptoms, I think that her case should be discussed in cath conference for consensus plan.  I will follow-up with her with formal recommendations.  Small LAD, Diag, and PDA/dRCA   I  spent 40 minutes counseling the patient face to face.   Corliss Skains 07/22/2023 4:04 PM

## 2023-07-29 ENCOUNTER — Telehealth: Payer: BC Managed Care – PPO | Admitting: Thoracic Surgery (Cardiothoracic Vascular Surgery)

## 2023-07-29 ENCOUNTER — Encounter (HOSPITAL_BASED_OUTPATIENT_CLINIC_OR_DEPARTMENT_OTHER): Payer: Self-pay | Admitting: Cardiology

## 2023-07-29 ENCOUNTER — Ambulatory Visit (HOSPITAL_BASED_OUTPATIENT_CLINIC_OR_DEPARTMENT_OTHER): Payer: BC Managed Care – PPO | Admitting: Cardiology

## 2023-07-29 VITALS — BP 112/62 | HR 53 | Ht 69.0 in | Wt 257.9 lb

## 2023-07-29 DIAGNOSIS — G4733 Obstructive sleep apnea (adult) (pediatric): Secondary | ICD-10-CM

## 2023-07-29 DIAGNOSIS — I2510 Atherosclerotic heart disease of native coronary artery without angina pectoris: Secondary | ICD-10-CM | POA: Diagnosis not present

## 2023-07-29 DIAGNOSIS — I10 Essential (primary) hypertension: Secondary | ICD-10-CM | POA: Diagnosis not present

## 2023-07-29 DIAGNOSIS — E1022 Type 1 diabetes mellitus with diabetic chronic kidney disease: Secondary | ICD-10-CM | POA: Diagnosis not present

## 2023-07-29 DIAGNOSIS — N186 End stage renal disease: Secondary | ICD-10-CM

## 2023-07-29 DIAGNOSIS — Z992 Dependence on renal dialysis: Secondary | ICD-10-CM

## 2023-07-29 MED ORDER — ROSUVASTATIN CALCIUM 10 MG PO TABS: 10.0000 mg | ORAL_TABLET | Freq: Every day | ORAL | 3 refills | Status: DC

## 2023-07-29 NOTE — Patient Instructions (Signed)
Medication Instructions:  Your physician recommends that you continue on your current medications as directed. Please refer to the Current Medication list given to you today.  *If you need a refill on your cardiac medications before your next appointment, please call your pharmacy*  Lab Work: NONE  Testing/Procedures: NONE  Follow-Up: At [facility], you and your health needs are our priority.  As part of our continuing mission to provide you with exceptional heart care, we have created designated Provider Care Teams.  These Care Teams include your primary Cardiologist (physician) and Advanced Practice Providers (APPs -  Physician Assistants and Nurse Practitioners) who all work together to provide you with the care you need, when you need it.  We recommend signing up for the patient portal called "MyChart".  Sign up information is provided on this After Visit Summary.  MyChart is used to connect with patients for Virtual Visits (Telemedicine).  Patients are able to view lab/test results, encounter notes, upcoming appointments, etc.  Non-urgent messages can be sent to your provider as well.   To learn more about what you can do with MyChart, go to [email or web address].    Your next appointment:   3 month(s)  The format for your next appointment:   In Person  Provider:   Jodelle Red, MD or Ronn Melena NP

## 2023-07-29 NOTE — Progress Notes (Signed)
 Cardiology Office Note:  .   Date:  07/29/2023  ID:  Jasmine [name], DOB 1971/02/13, MRN [id number] PCP: Richmond Campbell., PA-C  [facility] Providers Cardiologist:  Jodelle Red, MD {  History of Present Illness: .   Jasmine [name] is a 53 y.o. female with a hx of CAD, severe OSA, chronic respiratory failure with hypoxia, hypertension, type I diabetes, hypothyroidism, ESRD on HD, prior tobacco use, rheumatoid arthritis who is seen for follow up today. I initially saw her 04/22/20 as a new consult at the request of Richmond Campbell., PA-C for the evaluation and management of diastolic dysfunction.   Pertinent cards history: CPX 08/03/2017, notable for immediate desaturations with exercise to 87%, improved during recovery. Mild to moderate functional limitation, no cardiovascular limitation. There was chronotropic incompetence, with peak heart rate of 81 bpm.  Cardiac PET 06/2023 with reversible perfusion defect in anterior wall consistent with ischemia. Cath 07/2023 with 80% stenosis in long segment of mid to distal LAD. D1 has 80% stenosis. Prox to mid RCA with 70% stenosis. Echo 07/2023 with EF 55%, grade 1 DD. Normal RV size/function/pressure. No significant valve disease.  Today: Reviewed recent cath/echo. Saw Dr. Cliffton Asters 07/22/23, and the plan is to have her case discussed in heart team meeting. No chest pain, occasional shortness of breath both sitting still and with occasional exertion.  Pending PD catheter insertion. Unclear how long this will be/what type of anesthesia it would be, but she thinks likely conscious sedation.  ROS: Denies chest pain. No PND, orthopnea, LE edema or unexpected weight gain. No syncope or palpitations. ROS otherwise negative except as noted.   Studies Reviewed: Marland Kitchen    EKG:       Physical Exam:   VS:  BP 112/62   Pulse (!) 53   Ht 5\' 9"  (1.753 m)   Wt 257 lb 14.4 oz (117 kg)   LMP 03/23/2017 (Approximate)   SpO2 98%   BMI 38.09  kg/m    Wt Readings from Last 3 Encounters:  07/29/23 257 lb 14.4 oz (117 kg)  07/22/23 257 lb (116.6 kg)  07/15/23 257 lb (116.6 kg)    GEN: Well nourished, well developed in no acute distress HEENT: Normal, moist mucous membranes NECK: No JVD CARDIAC: regular rhythm, normal S1 and S2, no rubs or gallops. No murmur. VASCULAR: Radial and DP pulses 2+ bilaterally. No carotid bruits RESPIRATORY:  Clear to auscultation without rales, wheezing or rhonchi  ABDOMEN: Soft, non-tender, non-distended MUSCULOSKELETAL:  Ambulates independently SKIN: Warm and dry, no edema NEUROLOGIC:  Alert and oriented x 3. No focal neuro deficits noted. PSYCHIATRIC:  Normal affect    ASSESSMENT AND PLAN: .    2 Vessel CAD -reviewed cath and echo results -pending evaluation by heart team for recommendations (CABG vs PCI vs medical management) ADDENDED TO ADD: Dr. Cliffton Asters shared that heart team recommended PCI over CABG -has only ever been on pravastatin. Has trouble swallowing the size of the pill (which makes atorvastatin not a good option) -discussed rosuvastatin 10 mg daily (max recommended dose based on renal function) -last lipids from 12/28/22 showed Tchol 148, HDL 69, LDL 62, TG 87 -continue aspirin, clopidogrel  Hypertension: -occasional hypotension with dialysis but overall stable -continue carvedilol 12.5 mg BID, hydralazine 25 mg BID, torsemide 40 mf daily -she holds the above meds on dialysis days   Type I diabetes, with ESRD on dialysis -followed closely by nephrology -has started renal transplant evaluation process with [facility]  Morbid obesity -BMI 41  Severe sleep apnea -on CPAP and O2  Chronotropic incompetence -per CPX report -suspect this was due to high dose beta blocker. Asymptomatic at this time. Carvedilol has been decreased  Dispo: 3 mos  Signed, Jodelle Red, MD   Jodelle Red, MD, PhD, [facility]  [facility]  [place]  [facility]  Vascular at [facility] at [facility] [address], [address] [place], [place] [address] [phone]

## 2023-07-31 NOTE — Progress Notes (Unsigned)
 @Patient  ID: Jasmine [name], female    DOB: 10-Aug-1970, 53 y.o.   MRN: [id number]  No chief complaint on file.   Referring provider: Richmond Campbell., PA-C  HPI: 53  yo IDDM  for FU of OSA & asthma, mild pulmonary hypertension. She reports longstanding history of asthma for which she takes albuterol MDI as needed.  This is worse during the pollen season   PMH -IDDM on insulin pump, CKD stage III, multiple lung nodules stable from 20 18-20 20 RA -diagnosed 02/2020 ([facility])  Covid infection 05/2020  hospitalized 09/2021 for hypertension and hyperglycemia, CBG was 796.  ABG was 7.32/48/80 On discharge BUN/creatinine on 4/9 was 31/2.9 She required oxygen transiently.   Chief Complaint  Patient presents with   Follow-up    Cpap working well would like to try nasal pillows    70-month follow-up visit.  Breathing is okay. She arrives in a wheelchair her knees are  giving her trouble, she is following up with orthopedics. She uses albuterol as needed during change of season. She is compliant with CPAP machine and this certainly helps improve her daytime somnolence and fatigue. She could not tolerate a full facemask and compliance improved after switching to a nasal mask.  But this is now hurting nostrils and she would like to try nasal pillows.  Pressure is okay and she does not complain of significant dryness. We reviewed nocturnal oximetry and she is continuing on oxygen   02/25/2023 Patient presents today for hospital follow-up in July for heart failure. She is followed by our office for mild asthma and OSA. No issues with CPAP, sleeping ok at night. Breathing has been fine. No active asthma symptoms.  No cough, mucus production or fevers. No recent use of Albuterol.   Stress test showed no significant ischemia but did not large moderately severe fixed perfusion deficit involving the apical segments felt to be consitent with possible LAD disease vs stress cardiomyopathy. Plan  outpatient follow-up with cardiology (around 8/30); If wall motion abnormalities persist, will need further ischemic workup now that she will be on HD. She was started on clopidogrel and continued on home carvedilol and statin and advised to continue at discharge    Severe CKD, undergoes HD Tuesday, Thursday and Saturday.  Weight is trenging down.    Airview download 01/25/2023 - 02/23/2023 Usage days 29/30 days (97%) Average usage 8 hours 41 minutes Pressure 10 to 15 cm H2O (12.5 cm H2O-95%) Air leaks 28.6 L/min (95%) AHI 1.6  08/01/2023- interim Patient presents today for 4-6 month folow-up. She was last seen in September. Follows with our office for history of asthma, OSA and mild pulmonary HTN. She has severe CKD, undergoes HD Tuesday, Thursday and Saturday.           Significant tests/ events reviewed  10/2021 ONO on CPAP/RA >> desat >2h  Ambulation 10/05/19 OV >> dropped to 88%  04/2019 ABG 7.42/43/ 75     CT chest 04/2019 no emphysema, stable nodules   PFTs 04/2019-no airway obstruction, ratio 87, FEV1 79%, FVC 72%, TLC 82%, DLCO 76%   Echo 04/2017 normal LVEF, RVSP 38     CPAP titration >> 15 cm, Sm FF mask HST showed severe OSA - worse than before - avg 37 events/ hr with drop in O2 levels HST 07/2016 - moderate OSA with AHI 25/hour especially worse when supine with AHI 35/hour and lowest desaturation of 65%      Allergies  Allergen Reactions   Ciprofloxacin  Swelling and Other (See Comments)    Per pt caused lips swell and nauseous feeling   Levaquin [Levofloxacin] Swelling and Other (See Comments)    Per pt caused lips swell and nauseous feeling   Buspar [Buspirone] Other (See Comments)    abd cramping   Linaclotide Other (See Comments)   Advair Diskus [Fluticasone-Salmeterol] Other (See Comments)    Thrush    Biaxin [Clarithromycin] Rash   Hydroxyzine Palpitations    Immunization History  Administered Date(s) Administered   Influenza Split  08/07/2010, 04/07/2012, 04/07/2013, 03/03/2020   Influenza,inj,Quad PF,6+ Mos 03/03/2009, 04/07/2012, 04/07/2013, 03/10/2016, 03/02/2017, 02/23/2018, 03/01/2019, 03/03/2020, 04/10/2021, 04/02/2022   Influenza,inj,quad, With Preservative 03/05/2019, 03/07/2020   Influenza-Unspecified 03/03/2009, 08/07/2010, 04/07/2012, 04/07/2013, 03/10/2016, 03/10/2017, 03/03/2020   Pneumococcal Polysaccharide-23 08/07/2010, 08/07/2010, 04/08/2011, 03/02/2017   Pneumococcal-Unspecified 04/08/2011, 03/10/2017   Tdap 04/21/2016    Past Medical History:  Diagnosis Date   Anxiety    Arthritis    Asthma    Balance problems    Bipolar disorder (HCC)    Charcot ankle    Chronic fatigue    Chronic kidney disease    STAGE 3-4   Depression    Diabetes mellitus    DKA, type 1 (HCC) 11/04/2011   Elevated cholesterol    Fibromyalgia    GERD (gastroesophageal reflux disease)    Headache    History of suicidal ideation    Hyperlipemia    Hypertension    Hypothyroidism    IBS (irritable bowel syndrome)    Memory changes    Obesity    Sleep apnea    HAS C -PAP / DOES NOT USE   Stress incontinence    Pt had surgery to correct this.   Tachycardia    Tobacco abuse    Tremor    UTI (lower urinary tract infection)     Tobacco History: Social History   Tobacco Use  Smoking Status Former   Current packs/day: 0.00   Average packs/day: 0.8 packs/day for 20.0 years (15.0 ttl pk-yrs)   Types: Cigarettes   Start date: 06/08/1991   Quit date: 06/08/2011   Years since quitting: 12.1  Smokeless Tobacco Never   Counseling given: Not Answered   Outpatient Medications Prior to Visit  Medication Sig Dispense Refill   albuterol (VENTOLIN HFA) 108 (90 Base) MCG/ACT inhaler Inhale 1-2 puffs into the lungs every 6 (six) hours as needed for wheezing or shortness of breath. 6.7 g 1   Ascorbic Acid (VITAMIN C) 1000 MG tablet Take 1,000 mg by mouth daily.     aspirin EC 81 MG tablet Take 81 mg by mouth daily. Swallow  whole.     buPROPion (WELLBUTRIN XL) 300 MG 24 hr tablet Take 1 tablet (300 mg total) by mouth daily. 30 tablet 5   carvedilol (COREG) 12.5 MG tablet Take 12.5 mg by mouth 2 (two) times daily with a meal.     Cholecalciferol 50 MCG (2000 UT) TABS Take 2,000 Units by mouth in the morning.     clopidogrel (PLAVIX) 75 MG tablet Take 1 tablet (75 mg total) by mouth daily. 30 tablet 5   Continuous Blood Gluc Sensor MISC 1 each by Does not apply route as directed. Use as directed every 14 days. May dispense FreeStyle [employer] or similar.     DULoxetine (CYMBALTA) 30 MG capsule Take 1 capsule (30 mg total) by mouth daily. 30 capsule 3   folic acid (FOLVITE) 1 MG tablet Take 1 mg by mouth daily.  glucagon (GLUCAGEN HYPOKIT) 1 MG SOLR injection Inject 1 mg into the skin once as needed for up to 1 dose for low blood sugar. GlucaGen HypoKit 1 mg Injection 1 each 3   hydrALAZINE (APRESOLINE) 25 MG tablet Take 25 mg by mouth 2 (two) times daily.     lamoTRIgine (LAMICTAL) 200 MG tablet Take 1 tablet (200 mg total) by mouth daily. 30 tablet 5   levothyroxine (SYNTHROID) 175 MCG tablet TAKE ONE TABLET BY MOUTH DAILY AT 6am     LORazepam (ATIVAN) 0.5 MG tablet Take 1 tablet (0.5 mg total) by mouth daily as needed for anxiety. (Patient taking differently: Take 0.5 mg by mouth daily as needed for anxiety or sleep.) 30 tablet 2   Melatonin 10 MG TABS Take 10 mg by mouth at bedtime.     NOVOLOG 100 UNIT/ML injection Inject into the skin continuous. Sliding Scale Insulin pump 2.5 units basal Bolus with meal depending on the size     OVER THE COUNTER MEDICATION Apply 1 application  topically at bedtime as needed (Sleep). CBD oil     oxyCODONE-acetaminophen (PERCOCET/ROXICET) 5-325 MG tablet Take 1 tablet by mouth 4 (four) times daily as needed for severe pain (pain score 7-10).     OXYGEN Inhale 3 L into the lungs at bedtime.     rizatriptan (MAXALT-MLT) 5 MG disintegrating tablet Take 5 mg by mouth as  needed for migraine. May repeat in 2 hours if needed     rosuvastatin (CRESTOR) 10 MG tablet Take 1 tablet (10 mg total) by mouth daily. 90 tablet 3   sevelamer carbonate (RENVELA) 800 MG tablet Take 1,600 mg by mouth 3 (three) times daily.     sodium chloride (OCEAN) 0.65 % SOLN nasal spray Place 1 spray into both nostrils daily as needed for congestion.     Torsemide 40 MG TABS Take 40 mg by mouth daily. (Patient taking differently: Take 40 mg by mouth See admin instructions. Take on Mon., Wed., Fri., and Sunday)     No facility-administered medications prior to visit.      Review of Systems  Review of Systems   Physical Exam  LMP 03/23/2017 (Approximate)  Physical Exam   Lab Results:  CBC    Component Value Date/Time   WBC 7.4 07/09/2023 1258   RBC 3.74 (L) 07/09/2023 1258   HGB 11.6 (L) 07/09/2023 1258   HGB 13.0 07/01/2023 1554   HCT 35.3 (L) 07/09/2023 1258   HCT 39.8 07/01/2023 1554   PLT 237 07/09/2023 1258   PLT 257 07/01/2023 1554   MCV 94.4 07/09/2023 1258   MCV 94 07/01/2023 1554   MCH 31.0 07/09/2023 1258   MCHC 32.9 07/09/2023 1258   RDW 13.3 07/09/2023 1258   RDW 12.5 07/01/2023 1554   LYMPHSABS 1.8 03/31/2023 0418   MONOABS 0.9 03/31/2023 0418   EOSABS 0.0 03/31/2023 0418   BASOSABS 0.1 03/31/2023 0418    BMET    Component Value Date/Time   NA 137 07/15/2023 0606   NA 134 07/01/2023 1554   K 4.0 07/15/2023 0606   CL 100 07/15/2023 0606   CO2 25 07/15/2023 0606   GLUCOSE 77 07/15/2023 0606   BUN 17 07/15/2023 0606   BUN 33 (H) 07/01/2023 1554   CREATININE 3.46 (H) 07/15/2023 0606   CALCIUM 9.5 07/15/2023 0606   GFRNONAA 15 (L) 07/15/2023 0606   GFRAA 31 (L) 09/12/2019 0413    BNP No results found for: "BNP"  ProBNP  Component Value Date/Time   PROBNP 168.0 (H) 03/24/2020 1616    Imaging: ECHOCARDIOGRAM COMPLETE Result Date: 07/15/2023    ECHOCARDIOGRAM REPORT   Patient Name:   Jasmine [name] Date of Exam: 07/15/2023 Medical  Rec #:  [id number]         Height:       69.0 in Accession #:    [id number]        Weight:       257.0 lb Date of Birth:  [birth year]         BSA:          2.298 m Patient Age:    52 years          BP:           136/70 mmHg Patient Gender: F                 HR:           71 bpm. Exam Location:  Inpatient Procedure: 2D Echo, Cardiac Doppler and Color Doppler Indications:    Preoperative evaluation  History:        Patient has prior history of Echocardiogram examinations. Risk                 Factors:Diabetes, Hypertension, Dyslipidemia and Former Smoker.  Sonographer:    Dondra Prader RVT RCS Referring Phys: [id number] SHENG L HALEY  Sonographer Comments: ECHO was done with patient in a chair. IMPRESSIONS  1. Left ventricular ejection fraction, by estimation, is 55%. The left ventricle has normal function. The left ventricle has no regional wall motion abnormalities. Left ventricular diastolic parameters are consistent with Grade I diastolic dysfunction (impaired relaxation).  2. Right ventricular systolic function is normal. The right ventricular size is normal. There is normal pulmonary artery systolic pressure. The estimated right ventricular systolic pressure is 26.6 mmHg.  3. The mitral valve is normal in structure. No evidence of mitral valve regurgitation. No evidence of mitral stenosis.  4. The aortic valve is tricuspid. Aortic valve regurgitation is not visualized. No aortic stenosis is present.  5. The inferior vena cava is normal in size with greater than 50% respiratory variability, suggesting right atrial pressure of 3 mmHg.  6. Technically difficult study with poor acoustic windows. FINDINGS  Left Ventricle: Left ventricular ejection fraction, by estimation, is 55%. The left ventricle has normal function. The left ventricle has no regional wall motion abnormalities. The left ventricular internal cavity size was normal in size. There is no left ventricular hypertrophy. Left ventricular diastolic parameters are  consistent with Grade I diastolic dysfunction (impaired relaxation). Right Ventricle: The right ventricular size is normal. No increase in right ventricular wall thickness. Right ventricular systolic function is normal. There is normal pulmonary artery systolic pressure. The tricuspid regurgitant velocity is 2.43 m/s, and  with an assumed right atrial pressure of 3 mmHg, the estimated right ventricular systolic pressure is 26.6 mmHg. Left Atrium: Left atrial size was normal in size. Right Atrium: Right atrial size was normal in size. Pericardium: There is no evidence of pericardial effusion. Mitral Valve: The mitral valve is normal in structure. Mild mitral annular calcification. No evidence of mitral valve regurgitation. No evidence of mitral valve stenosis. Tricuspid Valve: The tricuspid valve is normal in structure. Tricuspid valve regurgitation is trivial. Aortic Valve: The aortic valve is tricuspid. Aortic valve regurgitation is not visualized. No aortic stenosis is present. Aortic valve mean gradient measures 3.5 mmHg. Aortic valve peak gradient measures 6.2 mmHg. Aortic  valve area, by VTI measures 2.37 cm. Pulmonic Valve: The pulmonic valve was normal in structure. Pulmonic valve regurgitation is not visualized. Aorta: The aortic root is normal in size and structure. Venous: The inferior vena cava is normal in size with greater than 50% respiratory variability, suggesting right atrial pressure of 3 mmHg. IAS/Shunts: No atrial level shunt detected by color flow Doppler.  LEFT VENTRICLE PLAX 2D LVIDd:         4.80 cm   Diastology LVIDs:         3.40 cm   LV e' medial:    5.55 cm/s LV PW:         1.10 cm   LV E/e' medial:  18.2 LV IVS:        1.20 cm   LV e' lateral:   8.70 cm/s LVOT diam:     1.80 cm   LV E/e' lateral: 11.6 LV SV:         60 LV SV Index:   26 LVOT Area:     2.54 cm  RIGHT VENTRICLE             IVC RV S prime:     10.60 cm/s  IVC diam: 1.70 cm LEFT ATRIUM             Index        RIGHT ATRIUM           Index LA diam:        4.10 cm 1.78 cm/m   RA Area:     7.33 cm LA Vol (A2C):   64.8 ml 28.19 ml/m  RA Volume:   13.00 ml 5.66 ml/m LA Vol (A4C):   66.8 ml 29.06 ml/m LA Biplane Vol: 68.3 ml 29.72 ml/m  AORTIC VALVE                    PULMONIC VALVE AV Area (Vmax):    2.44 cm     PV Vmax:       1.29 m/s AV Area (Vmean):   2.42 cm     PV Peak grad:  6.7 mmHg AV Area (VTI):     2.37 cm AV Vmax:           124.00 cm/s AV Vmean:          82.600 cm/s AV VTI:            0.254 m AV Peak Grad:      6.2 mmHg AV Mean Grad:      3.5 mmHg LVOT Vmax:         119.00 cm/s LVOT Vmean:        78.450 cm/s LVOT VTI:          0.236 m LVOT/AV VTI ratio: 0.93  AORTA Ao Root diam: 2.50 cm Ao Asc diam:  3.30 cm MITRAL VALVE                TRICUSPID VALVE MV Area (PHT): 2.73 cm     TR Peak grad:   23.6 mmHg MV Decel Time: 278 msec     TR Vmax:        243.00 cm/s MV E velocity: 101.00 cm/s MV A velocity: 118.00 cm/s  SHUNTS MV E/A ratio:  0.86         Systemic VTI:  0.24 m  Systemic Diam: 1.80 cm Dalton McleanMD Electronically signed by Wilfred Lacy Signature Date/Time: 07/15/2023/10:42:43 AM    Final    CARDIAC CATHETERIZATION Result Date: 07/15/2023   Mid LAD to Dist LAD lesion is 80% stenosed.   1st Diag lesion is 80% stenosed.   Prox RCA to Mid RCA lesion is 70% stenosed. 1.  Multivessel disease consisting involving left main, large bifurcating diagonal, and right coronary artery.  2.  LVEDP of 8 mmHg. Recommendation: Given history of type 1 diabetes with multivessel disease, a cardiothoracic surgical opinion will be pursued.   DG Chest 2 View Result Date: 07/09/2023 CLINICAL DATA:  Shortness of breath and dizziness EXAM: CHEST - 2 VIEW COMPARISON:  03/30/2023 FINDINGS: Right IJ dialysis catheter noted, distal tip at the right atrium. Hazy suprahilar confluent interstitial opacity potentially from mild edema, less likely atypical pneumonia. Low lung volumes are present, causing crowding of  the pulmonary vasculature. IMPRESSION: 1. Hazy suprahilar confluent interstitial opacity potentially from mild edema, less likely atypical pneumonia. 2. Low lung volumes. 3. Right IJ dialysis catheter. Electronically Signed   By: Gaylyn Rong M.D.   On: 07/09/2023 12:28     Assessment & Plan:   No problem-specific Assessment & Plan notes found for this encounter.     Glenford Bayley, NP 07/31/2023

## 2023-08-01 ENCOUNTER — Encounter: Payer: Self-pay | Admitting: Primary Care

## 2023-08-01 ENCOUNTER — Telehealth: Payer: Self-pay | Admitting: Primary Care

## 2023-08-01 ENCOUNTER — Ambulatory Visit (INDEPENDENT_AMBULATORY_CARE_PROVIDER_SITE_OTHER): Payer: BC Managed Care – PPO | Admitting: Primary Care

## 2023-08-01 VITALS — BP 115/74 | HR 64 | Ht 69.0 in | Wt 263.3 lb

## 2023-08-01 DIAGNOSIS — J9611 Chronic respiratory failure with hypoxia: Secondary | ICD-10-CM

## 2023-08-01 DIAGNOSIS — G4733 Obstructive sleep apnea (adult) (pediatric): Secondary | ICD-10-CM

## 2023-08-01 MED ORDER — AZELASTINE HCL 0.1 % NA SOLN: 2.0000 | Freq: Two times a day (BID) | NASAL | 2 refills | Status: DC

## 2023-08-01 NOTE — Telephone Encounter (Signed)
 Patient made aware per Beth's suggestion re: Xyzal vs 2 nasal sprays and to call back if not improvement for course of Prednisone.

## 2023-08-01 NOTE — Telephone Encounter (Signed)
 Please let patient know there is a contraindication with xyzal and kidney disease/dialysis patients. She should use the two nasal sprays I recommended. If symptoms do not improve we can try a course of prednisone. Call office

## 2023-08-01 NOTE — Patient Instructions (Signed)
 -  OBSTRUCTIVE SLEEP APNEA: Obstructive sleep apnea is a condition where your breathing stops and starts during sleep. Your symptoms are well controlled with your current CPAP settings, and you recently changed your mask for a better fit. Continue using your CPAP as directed.  -SINUS CONGESTION: Sinus congestion involves a buildup of mucus in your sinuses, causing headaches and sneezing. You have been experiencing this for about a month. We recommend trying Xyzal and Flonase for better symptom management. If your symptoms persist, consider using Astelin nasal spray.  -ASTHMA: Asthma is a condition where your airways narrow and swell, making it difficult to breathe. Your asthma is stable, and you are using your ProAir inhaler about twice a week. Continue with your current management plan.  -CHRONIC KIDNEY DISEASE: Chronic kidney disease means your kidneys are damaged and can't filter blood as well as they should. You are continuing with hemodialysis three times a week. Keep following your current treatment plan.  -DIASTOLIC HEART FAILURE: Diastolic heart failure occurs when the heart has difficulty relaxing and filling with blood. You are managing this condition with torsemide on non-dialysis days. Continue with your current management plan.  -CORONARY ARTERY DISEASE: Coronary artery disease is caused by the buildup of plaque in the heart's arteries, leading to blockages. You have three blockages and are currently on aspirin and Plavix. You will be switching from pravastatin to Crestor for cholesterol management. Follow your cardiologist's guidance for ongoing care.  Follow-up Please schedule Follow-up in three months with Dr. Vassie Loll.

## 2023-08-05 ENCOUNTER — Telehealth: Payer: Self-pay | Admitting: Psychiatry

## 2023-08-05 ENCOUNTER — Ambulatory Visit: Payer: BC Managed Care – PPO | Admitting: Thoracic Surgery (Cardiothoracic Vascular Surgery)

## 2023-08-05 DIAGNOSIS — I2510 Atherosclerotic heart disease of native coronary artery without angina pectoris: Secondary | ICD-10-CM

## 2023-08-05 NOTE — Progress Notes (Signed)
     [address]       [place] [address]             [phone]       Patient: Home Provider: Office Consent for Telemedicine visit obtained.  Today's visit was completed via a real-time telehealth (see specific modality noted below). The patient/authorized person provided oral consent at the time of the visit to engage in a telemedicine encounter with the present provider at [facility]. The patient/authorized person was informed of the potential benefits, limitations, and risks of telemedicine. The patient/authorized person expressed understanding that the laws that protect confidentiality also apply to telemedicine. The patient/authorized person acknowledged understanding that telemedicine does not provide emergency services and that he or she would need to call 911 or proceed to the nearest hospital for help if such a need arose.   Total time spent in the clinical discussion 10 minutes.  Telehealth Modality: Phone visit (audio only)  I had a telephone visit with Jasmine [name].  We discussed the decisions from cath conference to proceed with PCI if needed.  No plans for surgery at this point.  [name]

## 2023-08-07 ENCOUNTER — Encounter (HOSPITAL_BASED_OUTPATIENT_CLINIC_OR_DEPARTMENT_OTHER): Payer: Self-pay | Admitting: Cardiology

## 2023-08-08 NOTE — H&P (View-Only) (Signed)
 Cardiology Office Note:   Date:  08/15/2023  ID:  Jasmine [name], DOB Jun 06, 1971, MRN [id number] PCP:  Richmond Campbell., PA-C  [facility] Providers Cardiologist:  Alverda Skeans, MD Referring MD: Richmond Campbell., PA-C  Chief Complaint/Reason for Referral: Management of coronary artery disease ASSESSMENT:    1. Coronary artery disease involving native coronary artery of native heart without angina pectoris   2. Type 1 diabetes mellitus with complications (HCC)   3. Hyperlipidemia associated with type 2 diabetes mellitus (HCC)   4. Aortic atherosclerosis (HCC)   5. ESRD (end stage renal disease) on dialysis (HCC)   6. OSA (obstructive sleep apnea)   7. BMI 38.0-38.9,adult   8. Pre-procedure lab exam     PLAN:   In order of problems listed above: Coronary artery disease: I discussed percutaneous coronary invention with the patient given her poor capacity to rehabilitate per Dr. Cliffton Asters.  The patient is on chronic Plavix and aspirin already.  Will obtain labs and refer the patient for planned PCI of the LAD, RCA and possibly diagonal for March 25. Type 1 diabetes mellitus: Continue aspirin 81 mg daily, Crestor 10 mg daily.  Not a candidate for SGLT2 inhibitor given end-stage renal disease and type 1 diabetes. Hyperlipidemia: Continue Crestor 10 mg daily.  Check lipids, LFTs, and LP(a) today. Aortic atherosclerosis: Continue aspirin 81 mg and Crestor 10 mg.  Strict blood pressure control. End-stage renal disease: Continue peritoneal dialysis. Obstructive sleep apnea: Continue CPAP. Elevated BMI: Not a candidate for GLP-1 receptor agonist therapy given type 1 diabetes.            Dispo:  Return in about 3 months (around 11/15/2023).      Medication Adjustments/Labs and Tests Ordered: Current medicines are reviewed at length with the patient today.  Concerns regarding medicines are outlined above.  The following changes have been made:  no change   Labs/tests  ordered: Orders Placed This Encounter  Procedures   CBC   Comprehensive metabolic panel   Lipid panel   Lipoprotein A (LPA)   EKG 12-Lead    Medication Changes: No orders of the defined types were placed in this encounter.   Current medicines are reviewed at length with the patient today.  The patient does not have concerns regarding medicines.  I spent 38 minutes reviewing all clinical data during and prior to this visit including all relevant imaging studies, laboratories, clinical information from other health systems and prior notes from both Cardiology and other specialties, interviewing the patient, conducting a complete physical examination, and coordinating care in order to formulate a comprehensive and personalized evaluation and treatment plan.   History of Present Illness:      FOCUSED PROBLEM LIST:   CAD Mid to distal LAD, diagonal, and proximal to mid RCA disease coronary angiography February 2025 T1DM ESRD On HD Being considered for transplantation Hyperlipidemia Aortic atherosclerosis PET stress test 2025 BMI 38 OSA On CPAP  March 2025:  Patient consents to use of AI scribe. The patient is here to discuss management of her coronary artery disease.  I performed cardiac catheterization on the patient in February due to a high risk stress test.  This demonstrated LAD, diagonal, and right coronary artery disease.  She was seen by cardiac surgery and given her recent foot fracture was thought to be not an ideal candidate for surgical revascularization.  She was presented at the heart team meeting and consensus opinion given these issues was that PCI should be pursued.  She is here to discuss this.  No recent chest pain since her last visit. Breathing is stable, with no significant shortness of breath, only experiencing allergy-related issues. She is currently on Plavix, which was started last year when she began dialysis.  She is in the preparatory phase for  peritoneal dialysis, with surgery scheduled for catheter placement. She currently undergoes dialysis on Tuesdays, Thursdays, and Saturdays.  No recent lightheadedness or blacking out, although she occasionally feels lightheaded upon standing, which she attributes to orthostatic changes.  She uses a wheelchair due to fatigue and difficulty walking long distances, attributed to knee and back issues. She reports that she 'gives out easy' due to these problems.          Current Medications: Current Meds  Medication Sig   albuterol (VENTOLIN HFA) 108 (90 Base) MCG/ACT inhaler Inhale 1-2 puffs into the lungs every 6 (six) hours as needed for wheezing or shortness of breath.   Ascorbic Acid (VITAMIN C) 1000 MG tablet Take 1,000 mg by mouth daily.   aspirin EC 81 MG tablet Take 81 mg by mouth daily. Swallow whole.   azelastine (ASTELIN) 0.1 % nasal spray Place 2 sprays into both nostrils 2 (two) times daily. Use in each nostril as directed   buPROPion (WELLBUTRIN XL) 300 MG 24 hr tablet Take 1 tablet (300 mg total) by mouth daily.   carvedilol (COREG) 12.5 MG tablet Take 12.5 mg by mouth 2 (two) times daily with a meal.   Cholecalciferol 50 MCG (2000 UT) TABS Take 2,000 Units by mouth in the morning.   clopidogrel (PLAVIX) 75 MG tablet Take 1 tablet (75 mg total) by mouth daily.   Continuous Blood Gluc Sensor MISC 1 each by Does not apply route as directed. Use as directed every 14 days. May dispense FreeStyle [employer] or similar.   diclofenac Sodium (VOLTAREN) 1 % GEL Apply topically.   dicyclomine (BENTYL) 10 MG capsule Take by mouth.   DULoxetine (CYMBALTA) 30 MG capsule Take 1 capsule (30 mg total) by mouth daily.   folic acid (FOLVITE) 1 MG tablet Take 1 mg by mouth daily.   glucagon (GLUCAGEN HYPOKIT) 1 MG SOLR injection Inject 1 mg into the skin once as needed for up to 1 dose for low blood sugar. GlucaGen HypoKit 1 mg Injection   hydrALAZINE (APRESOLINE) 25 MG tablet Take 25 mg  by mouth 2 (two) times daily.   insulin detemir (LEVEMIR) 100 UNIT/ML injection Inject into the skin.   lamoTRIgine (LAMICTAL) 200 MG tablet Take 1 tablet (200 mg total) by mouth daily.   levothyroxine (SYNTHROID) 175 MCG tablet TAKE ONE TABLET BY MOUTH DAILY AT 6am   LORazepam (ATIVAN) 0.5 MG tablet Take 1 tablet (0.5 mg total) by mouth daily as needed for anxiety. (Patient taking differently: Take 0.5 mg by mouth daily as needed for anxiety or sleep.)   Melatonin 10 MG TABS Take 10 mg by mouth at bedtime.   NOVOLOG 100 UNIT/ML injection Inject into the skin continuous. Sliding Scale Insulin pump 2.5 units basal Bolus with meal depending on the size   ondansetron (ZOFRAN-ODT) 4 MG disintegrating tablet Dissolve 1 tablet (4 mg total) by mouth every 8 (eight) hours as needed.   OVER THE COUNTER MEDICATION Apply 1 application  topically at bedtime as needed (Sleep). CBD oil   oxyCODONE-acetaminophen (PERCOCET/ROXICET) 5-325 MG tablet Take 1 tablet by mouth 4 (four) times daily as needed for severe pain (pain score 7-10).   OXYGEN Inhale 3  L into the lungs at bedtime.   rizatriptan (MAXALT-MLT) 5 MG disintegrating tablet Take 5 mg by mouth as needed for migraine. May repeat in 2 hours if needed   rosuvastatin (CRESTOR) 10 MG tablet Take 1 tablet (10 mg total) by mouth daily.   sevelamer carbonate (RENVELA) 800 MG tablet Take 1,600 mg by mouth 3 (three) times daily.   sodium chloride (OCEAN) 0.65 % SOLN nasal spray Place 1 spray into both nostrils daily as needed for congestion.   torsemide (DEMADEX) 20 MG tablet TAKE TWO TABLETS BY MOUTH AS DIRECTED ON non dialysis DAYS     Review of Systems:   Please see the history of present illness.    All other systems reviewed and are negative.     EKGs/Labs/Other Test Reviewed:   EKG: EKG from fibber 2025 demonstrates ectopic atrial rhythm with anteroseptal MI pattern  EKG Interpretation Date/Time:  Monday August 15 2023 16:14:45 EDT Ventricular  Rate:  60 PR Interval:  158 QRS Duration:  98 QT Interval:  438 QTC Calculation: 438 R Axis:   28  Text Interpretation: Normal sinus rhythm Septal infarct , age undetermined When compared with ECG of 09-Jul-2023 13:05, PREVIOUS ECG IS PRESENT Confirmed by Alverda Skeans ([id number]) on 08/15/2023 4:16:09 PM         Risk Assessment/Calculations:          Physical Exam:   VS:  BP 138/70   Pulse 72   Ht 5\' 9"  (1.753 m)   Wt 263 lb 6.4 oz (119.5 kg)   LMP 03/23/2017 (Approximate)   SpO2 96%   BMI 38.90 kg/m        Wt Readings from Last 3 Encounters:  08/15/23 263 lb 6.4 oz (119.5 kg)  08/01/23 263 lb 4.8 oz (119.4 kg)  07/29/23 257 lb 14.4 oz (117 kg)      GENERAL:  No apparent distress, AOx3 HEENT:  No carotid bruits, +2 carotid impulses, no scleral icterus CAR: RRR no murmurs, gallops, rubs, or thrills RES:  Clear to auscultation bilaterally ABD:  Soft, nontender, nondistended, positive bowel sounds x 4 VASC:  +2 radial pulses, +2 carotid pulses NEURO:  CN 2-12 grossly intact; motor and sensory grossly intact PSYCH:  No active depression or anxiety EXT:  No edema, ecchymosis, or cyanosis  Signed, Orbie Pyo, MD  08/15/2023 4:30 PM    [facility] [address] [place], [place], [place]  [address] Phone: [phone]; Fax: [phone]   Note:  This document was prepared using Dragon voice recognition software and may include unintentional dictation errors.

## 2023-08-08 NOTE — Progress Notes (Signed)
 Cardiology Office Note:   Date:  08/15/2023  ID:  Jasmine [name], DOB Jun 06, 1971, MRN [id number] PCP:  Richmond Campbell., PA-C  [facility] Providers Cardiologist:  Alverda Skeans, MD Referring MD: Richmond Campbell., PA-C  Chief Complaint/Reason for Referral: Management of coronary artery disease ASSESSMENT:    1. Coronary artery disease involving native coronary artery of native heart without angina pectoris   2. Type 1 diabetes mellitus with complications (HCC)   3. Hyperlipidemia associated with type 2 diabetes mellitus (HCC)   4. Aortic atherosclerosis (HCC)   5. ESRD (end stage renal disease) on dialysis (HCC)   6. OSA (obstructive sleep apnea)   7. BMI 38.0-38.9,adult   8. Pre-procedure lab exam     PLAN:   In order of problems listed above: Coronary artery disease: I discussed percutaneous coronary invention with the patient given her poor capacity to rehabilitate per Dr. Cliffton Asters.  The patient is on chronic Plavix and aspirin already.  Will obtain labs and refer the patient for planned PCI of the LAD, RCA and possibly diagonal for March 25. Type 1 diabetes mellitus: Continue aspirin 81 mg daily, Crestor 10 mg daily.  Not a candidate for SGLT2 inhibitor given end-stage renal disease and type 1 diabetes. Hyperlipidemia: Continue Crestor 10 mg daily.  Check lipids, LFTs, and LP(a) today. Aortic atherosclerosis: Continue aspirin 81 mg and Crestor 10 mg.  Strict blood pressure control. End-stage renal disease: Continue peritoneal dialysis. Obstructive sleep apnea: Continue CPAP. Elevated BMI: Not a candidate for GLP-1 receptor agonist therapy given type 1 diabetes.            Dispo:  Return in about 3 months (around 11/15/2023).      Medication Adjustments/Labs and Tests Ordered: Current medicines are reviewed at length with the patient today.  Concerns regarding medicines are outlined above.  The following changes have been made:  no change   Labs/tests  ordered: Orders Placed This Encounter  Procedures   CBC   Comprehensive metabolic panel   Lipid panel   Lipoprotein A (LPA)   EKG 12-Lead    Medication Changes: No orders of the defined types were placed in this encounter.   Current medicines are reviewed at length with the patient today.  The patient does not have concerns regarding medicines.  I spent 38 minutes reviewing all clinical data during and prior to this visit including all relevant imaging studies, laboratories, clinical information from other health systems and prior notes from both Cardiology and other specialties, interviewing the patient, conducting a complete physical examination, and coordinating care in order to formulate a comprehensive and personalized evaluation and treatment plan.   History of Present Illness:      FOCUSED PROBLEM LIST:   CAD Mid to distal LAD, diagonal, and proximal to mid RCA disease coronary angiography February 2025 T1DM ESRD On HD Being considered for transplantation Hyperlipidemia Aortic atherosclerosis PET stress test 2025 BMI 38 OSA On CPAP  March 2025:  Patient consents to use of AI scribe. The patient is here to discuss management of her coronary artery disease.  I performed cardiac catheterization on the patient in February due to a high risk stress test.  This demonstrated LAD, diagonal, and right coronary artery disease.  She was seen by cardiac surgery and given her recent foot fracture was thought to be not an ideal candidate for surgical revascularization.  She was presented at the heart team meeting and consensus opinion given these issues was that PCI should be pursued.  She is here to discuss this.  No recent chest pain since her last visit. Breathing is stable, with no significant shortness of breath, only experiencing allergy-related issues. She is currently on Plavix, which was started last year when she began dialysis.  She is in the preparatory phase for  peritoneal dialysis, with surgery scheduled for catheter placement. She currently undergoes dialysis on Tuesdays, Thursdays, and Saturdays.  No recent lightheadedness or blacking out, although she occasionally feels lightheaded upon standing, which she attributes to orthostatic changes.  She uses a wheelchair due to fatigue and difficulty walking long distances, attributed to knee and back issues. She reports that she 'gives out easy' due to these problems.          Current Medications: Current Meds  Medication Sig   albuterol (VENTOLIN HFA) 108 (90 Base) MCG/ACT inhaler Inhale 1-2 puffs into the lungs every 6 (six) hours as needed for wheezing or shortness of breath.   Ascorbic Acid (VITAMIN C) 1000 MG tablet Take 1,000 mg by mouth daily.   aspirin EC 81 MG tablet Take 81 mg by mouth daily. Swallow whole.   azelastine (ASTELIN) 0.1 % nasal spray Place 2 sprays into both nostrils 2 (two) times daily. Use in each nostril as directed   buPROPion (WELLBUTRIN XL) 300 MG 24 hr tablet Take 1 tablet (300 mg total) by mouth daily.   carvedilol (COREG) 12.5 MG tablet Take 12.5 mg by mouth 2 (two) times daily with a meal.   Cholecalciferol 50 MCG (2000 UT) TABS Take 2,000 Units by mouth in the morning.   clopidogrel (PLAVIX) 75 MG tablet Take 1 tablet (75 mg total) by mouth daily.   Continuous Blood Gluc Sensor MISC 1 each by Does not apply route as directed. Use as directed every 14 days. May dispense FreeStyle [employer] or similar.   diclofenac Sodium (VOLTAREN) 1 % GEL Apply topically.   dicyclomine (BENTYL) 10 MG capsule Take by mouth.   DULoxetine (CYMBALTA) 30 MG capsule Take 1 capsule (30 mg total) by mouth daily.   folic acid (FOLVITE) 1 MG tablet Take 1 mg by mouth daily.   glucagon (GLUCAGEN HYPOKIT) 1 MG SOLR injection Inject 1 mg into the skin once as needed for up to 1 dose for low blood sugar. GlucaGen HypoKit 1 mg Injection   hydrALAZINE (APRESOLINE) 25 MG tablet Take 25 mg  by mouth 2 (two) times daily.   insulin detemir (LEVEMIR) 100 UNIT/ML injection Inject into the skin.   lamoTRIgine (LAMICTAL) 200 MG tablet Take 1 tablet (200 mg total) by mouth daily.   levothyroxine (SYNTHROID) 175 MCG tablet TAKE ONE TABLET BY MOUTH DAILY AT 6am   LORazepam (ATIVAN) 0.5 MG tablet Take 1 tablet (0.5 mg total) by mouth daily as needed for anxiety. (Patient taking differently: Take 0.5 mg by mouth daily as needed for anxiety or sleep.)   Melatonin 10 MG TABS Take 10 mg by mouth at bedtime.   NOVOLOG 100 UNIT/ML injection Inject into the skin continuous. Sliding Scale Insulin pump 2.5 units basal Bolus with meal depending on the size   ondansetron (ZOFRAN-ODT) 4 MG disintegrating tablet Dissolve 1 tablet (4 mg total) by mouth every 8 (eight) hours as needed.   OVER THE COUNTER MEDICATION Apply 1 application  topically at bedtime as needed (Sleep). CBD oil   oxyCODONE-acetaminophen (PERCOCET/ROXICET) 5-325 MG tablet Take 1 tablet by mouth 4 (four) times daily as needed for severe pain (pain score 7-10).   OXYGEN Inhale 3  L into the lungs at bedtime.   rizatriptan (MAXALT-MLT) 5 MG disintegrating tablet Take 5 mg by mouth as needed for migraine. May repeat in 2 hours if needed   rosuvastatin (CRESTOR) 10 MG tablet Take 1 tablet (10 mg total) by mouth daily.   sevelamer carbonate (RENVELA) 800 MG tablet Take 1,600 mg by mouth 3 (three) times daily.   sodium chloride (OCEAN) 0.65 % SOLN nasal spray Place 1 spray into both nostrils daily as needed for congestion.   torsemide (DEMADEX) 20 MG tablet TAKE TWO TABLETS BY MOUTH AS DIRECTED ON non dialysis DAYS     Review of Systems:   Please see the history of present illness.    All other systems reviewed and are negative.     EKGs/Labs/Other Test Reviewed:   EKG: EKG from fibber 2025 demonstrates ectopic atrial rhythm with anteroseptal MI pattern  EKG Interpretation Date/Time:  Monday August 15 2023 16:14:45 EDT Ventricular  Rate:  60 PR Interval:  158 QRS Duration:  98 QT Interval:  438 QTC Calculation: 438 R Axis:   28  Text Interpretation: Normal sinus rhythm Septal infarct , age undetermined When compared with ECG of 09-Jul-2023 13:05, PREVIOUS ECG IS PRESENT Confirmed by Alverda Skeans ([id number]) on 08/15/2023 4:16:09 PM         Risk Assessment/Calculations:          Physical Exam:   VS:  BP 138/70   Pulse 72   Ht 5\' 9"  (1.753 m)   Wt 263 lb 6.4 oz (119.5 kg)   LMP 03/23/2017 (Approximate)   SpO2 96%   BMI 38.90 kg/m        Wt Readings from Last 3 Encounters:  08/15/23 263 lb 6.4 oz (119.5 kg)  08/01/23 263 lb 4.8 oz (119.4 kg)  07/29/23 257 lb 14.4 oz (117 kg)      GENERAL:  No apparent distress, AOx3 HEENT:  No carotid bruits, +2 carotid impulses, no scleral icterus CAR: RRR no murmurs, gallops, rubs, or thrills RES:  Clear to auscultation bilaterally ABD:  Soft, nontender, nondistended, positive bowel sounds x 4 VASC:  +2 radial pulses, +2 carotid pulses NEURO:  CN 2-12 grossly intact; motor and sensory grossly intact PSYCH:  No active depression or anxiety EXT:  No edema, ecchymosis, or cyanosis  Signed, Orbie Pyo, MD  08/15/2023 4:30 PM    [facility] [address] [place], [place], [place]  [address] Phone: [phone]; Fax: [phone]   Note:  This document was prepared using Dragon voice recognition software and may include unintentional dictation errors.

## 2023-08-09 ENCOUNTER — Ambulatory Visit: Payer: Self-pay | Admitting: Emergency Medicine

## 2023-08-09 NOTE — Heart Team MDD (Signed)
   Heart Team Multi-Disciplinary Discussion  Patient: Jasmine [name]  DOB: 1971/05/08  MRN: [id number]   Date: 08/09/2023  10:43 AM    Attendees: Interventional Cardiology: Dorothyann Peng, MD Yates Decamp, MD Bryan Lemma, MD Truett Mainland, MD Lorine Bears, MD Alverda Skeans, MD  Cardiothoracic Surgery: Brynda Greathouse, MD     Patient History: 53 y.o. female with a hx of CAD, severe OSA, chronic respiratory failure with hypoxia, hypertension, type I diabetes, hypothyroidism, ESRD on HD, prior tobacco use, rheumatoid arthritis with c/o exertional dyspnea. Pending PD catheter insertion.     Risk Factors: Diabetes Mellitus Chronic Kidney Disease Hypertension Additional Risk Factors:    Review of Prior Angiography and PCI Procedures: Left heart cath and coronary angiography from 07/15/23 images were reviewed and discussed in detail including: 80% stenosis in long segment of mid to distal LAD. D1 has 80% stenosis. Prox to mid RCA with 70% stenosis. Additionally, the echo from 07/2023 was reviewed and discussed in detail including: EF 55%, grade 1 DD. Normal RV size/function/pressure. No significant valve disease.      Discussion: After presentation, consideration of treatment options occurred including 2 vessel CABG versus PCI. With thorough discussion, team consensus was for PCI of LAD and RCA.    Recommendations: PCI      Alison Murray, RN  08/09/2023 10:43 AM

## 2023-08-15 ENCOUNTER — Ambulatory Visit: Payer: BC Managed Care – PPO | Attending: Internal Medicine | Admitting: Internal Medicine

## 2023-08-15 ENCOUNTER — Ambulatory Visit (HOSPITAL_BASED_OUTPATIENT_CLINIC_OR_DEPARTMENT_OTHER): Payer: BC Managed Care – PPO | Admitting: Pulmonary Disease

## 2023-08-15 ENCOUNTER — Encounter: Payer: Self-pay | Admitting: Internal Medicine

## 2023-08-15 VITALS — BP 138/70 | HR 72 | Ht 69.0 in | Wt 263.4 lb

## 2023-08-15 DIAGNOSIS — G4733 Obstructive sleep apnea (adult) (pediatric): Secondary | ICD-10-CM

## 2023-08-15 DIAGNOSIS — I700 Atherosclerosis of aorta: Secondary | ICD-10-CM | POA: Diagnosis not present

## 2023-08-15 DIAGNOSIS — E785 Hyperlipidemia, unspecified: Secondary | ICD-10-CM | POA: Diagnosis present

## 2023-08-15 DIAGNOSIS — Z01812 Encounter for preprocedural laboratory examination: Secondary | ICD-10-CM | POA: Diagnosis present

## 2023-08-15 DIAGNOSIS — Z6838 Body mass index (BMI) 38.0-38.9, adult: Secondary | ICD-10-CM | POA: Diagnosis present

## 2023-08-15 DIAGNOSIS — N186 End stage renal disease: Secondary | ICD-10-CM

## 2023-08-15 DIAGNOSIS — I2510 Atherosclerotic heart disease of native coronary artery without angina pectoris: Secondary | ICD-10-CM

## 2023-08-15 DIAGNOSIS — Z992 Dependence on renal dialysis: Secondary | ICD-10-CM | POA: Insufficient documentation

## 2023-08-15 DIAGNOSIS — E108 Type 1 diabetes mellitus with unspecified complications: Secondary | ICD-10-CM

## 2023-08-15 DIAGNOSIS — E1169 Type 2 diabetes mellitus with other specified complication: Secondary | ICD-10-CM | POA: Diagnosis present

## 2023-08-15 NOTE — Patient Instructions (Addendum)
 Medication Instructions:  Your physician recommends that you continue on your current medications as directed. Please refer to the Current Medication list given to you today.  *If you need a refill on your cardiac medications before your next appointment, please call your pharmacy*  Lab Work: TODAY: CBC, Lipid panel, LP(a), CMP If you have labs (blood work) drawn today and your tests are completely normal, you will receive your results only by: MyChart Message (if you have MyChart) OR A paper copy in the mail If you have any lab test that is abnormal or we need to change your treatment, we will call you to review the results.  Testing/Procedures: Your physician has requested that you have a cardiac catheterization. Cardiac catheterization is used to diagnose and/or treat various heart conditions. Doctors may recommend this procedure for a number of different reasons. The most common reason is to evaluate chest pain. Chest pain can be a symptom of coronary artery disease (CAD), and cardiac catheterization can show whether plaque is narrowing or blocking your heart's arteries. This procedure is also used to evaluate the valves, as well as measure the blood flow and oxygen levels in different parts of your heart. For further information please visit [email or web address] Please follow instruction sheet, as given.  Follow-Up: At [facility], you and your health needs are our priority.  As part of our continuing mission to provide you with exceptional heart care, we have created designated Provider Care Teams.  These Care Teams include your primary Cardiologist (physician) and Advanced Practice Providers (APPs -  Physician Assistants and Nurse Practitioners) who all work together to provide you with the care you need, when you need it.  Your next appointment:   3 month(s)  The format for your next appointment:   In Person  Provider:   Jodelle Red, MD {  Other Instructions      Cardiac/Peripheral Catheterization   You are scheduled for a Cardiac Catheterization on Tuesday, March 25 with Dr. Alverda Skeans.  1. Please arrive at the [facility] (Main Entrance A) at [facility]: [address] [place], [place] [address] at 8:30 AM (This time is 2 hour(s) before your procedure to ensure your preparation).   Free valet parking service is available. You will check in at ADMITTING. The support person will be asked to wait in the waiting room.  It is OK to have someone drop you off and come back when you are ready to be discharged.        Special note: Every effort is made to have your procedure done on time. Please understand that emergencies sometimes delay scheduled procedures.  2. Diet: Do not eat solid foods after midnight.  You may have clear liquids until 5 AM the day of the procedure.  3. Labs: TODAY (CMP, Lipid panel, LP(a), CBC)  4. Medication instructions in preparation for your procedure:   Contrast Allergy: No  Stop taking, Torsemide (Demadex) Monday, March 24, and Wednesday March 26  If you take your Levemir at night, take half of your regular dose.  For your insulin pump: set to half of your basal rate (or place in "activity mode" if applicable) at midnight (12:00 AM on Tuesday March 25th).  On the morning of your procedure, take Aspirin 81 mg and Plavix/Clopidogrel and any morning medicines NOT listed above.  You may use sips of water.  5. Plan to go home the same day, you will only stay overnight if medically necessary. 6. You MUST have  a responsible adult to drive you home. 7. An adult MUST be with you the first 24 hours after you arrive home. 8. Bring a current list of your medications, and the last time and date medication taken. 9. Bring ID and current insurance cards. 10.Please wear clothes that are easy to get on and off and wear slip-on shoes.  Thank you for allowing [place] to care for you!   -- [facility]

## 2023-08-17 ENCOUNTER — Telehealth: Payer: Self-pay

## 2023-08-17 ENCOUNTER — Encounter: Payer: Self-pay | Admitting: Internal Medicine

## 2023-08-17 LAB — COMPREHENSIVE METABOLIC PANEL
ALT: 10 IU/L (ref 0–32)
AST: 11 IU/L (ref 0–40)
Albumin: 3.9 g/dL (ref 3.8–4.9)
Alkaline Phosphatase: 146 IU/L — ABNORMAL HIGH (ref 44–121)
BUN/Creatinine Ratio: 6 — ABNORMAL LOW (ref 9–23)
BUN: 30 mg/dL — ABNORMAL HIGH (ref 6–24)
Bilirubin Total: <0.2 mg/dL (ref 0.0–1.2)
CO2: 24 mmol/L (ref 20–29)
Calcium: 9.4 mg/dL (ref 8.7–10.2)
Chloride: 99 mmol/L (ref 96–106)
Creatinine, Ser: 4.78 mg/dL — ABNORMAL HIGH (ref 0.57–1.00)
Globulin, Total: 2 g/dL (ref 1.5–4.5)
Glucose: 80 mg/dL (ref 70–99)
Potassium: 4.5 mmol/L (ref 3.5–5.2)
Sodium: 138 mmol/L (ref 134–144)
Total Protein: 5.9 g/dL — ABNORMAL LOW (ref 6.0–8.5)
eGFR: 10 mL/min/{1.73_m2} — ABNORMAL LOW (ref >59)

## 2023-08-17 LAB — CBC
Hematocrit: 35.8 % (ref 34.0–46.6)
Hemoglobin: 11.5 g/dL (ref 11.1–15.9)
MCH: 29.9 pg (ref 26.6–33.0)
MCHC: 32.1 g/dL (ref 31.5–35.7)
MCV: 93 fL (ref 79–97)
Platelets: 254 10*3/uL (ref 150–450)
RBC: 3.85 x10E6/uL (ref 3.77–5.28)
RDW: 12.2 % (ref 11.7–15.4)
WBC: 6.2 10*3/uL (ref 3.4–10.8)

## 2023-08-17 LAB — LIPID PANEL
Chol/HDL Ratio: 1.9 ratio (ref 0.0–4.4)
Cholesterol, Total: 151 mg/dL (ref 100–199)
HDL: 79 mg/dL (ref >39)
LDL Chol Calc (NIH): 59 mg/dL (ref 0–99)
Triglycerides: 67 mg/dL (ref 0–149)
VLDL Cholesterol Cal: 13 mg/dL (ref 5–40)

## 2023-08-17 LAB — LIPOPROTEIN A (LPA): Lipoprotein (a): 15.5 nmol/L (ref <75.0)

## 2023-08-17 NOTE — Telephone Encounter (Signed)
 Triage/Advice:   Ander Slade from [facility] in [place] called stating pt will require permanent access that she had vein mapping in November but didn't know how to proceed.   -reviewed chart & spoke to Joy who stated she was never abe to have a PD cath put in back in [place] b/c she required cardiac clearance and now is requiring a cardiac stent scheduled with Dr. Lynnette Caffey for 08/30/23.  She currently has a tunneled CVC access that is in good condition  - sent message for Dr. Myra Gianotti to inquiring if she needs redo vein mapping & will she likely have to wait 6 months post stent before she can have this done?   -reply pending.  Joy @ [facility] updated

## 2023-08-29 ENCOUNTER — Telehealth: Payer: Self-pay | Admitting: *Deleted

## 2023-08-29 NOTE — Telephone Encounter (Signed)
 Patient reports usual dialysis schedule [facility]. Patient tells me she is going to dialysis today (Monday 3/24) and dialysis center is aware she is scheduled for procedure tomorrow. Patient also tells me Dr Lynnette Caffey told her she should plan to stay overnight at the hospital after the procedure 3/25.

## 2023-08-29 NOTE — Telephone Encounter (Addendum)
 Coronary Stent scheduled at [facility] for: Tuesday August 30, 2023 10:30 AM Arrival time [facility] Main Entrance A at: 8:30 AM  Nothing to eat after midnight prior to procedure, clear liquids until 5 AM day of procedure.  Medication instructions: -Hold:  Torsemide-AM of procedure  Insulin pump-pt will manage -Other usual morning medications can be taken with sips of water including aspirin 81 mg and Plavix 75 mg.  Plan to go home the same day, you will only stay overnight if medically necessary.  You must have responsible adult to drive you home.  Someone must be with you the first 24 hours after you arrive home.  Reviewed procedure instructions with patient.

## 2023-08-30 ENCOUNTER — Other Ambulatory Visit: Payer: Self-pay

## 2023-08-30 ENCOUNTER — Ambulatory Visit (HOSPITAL_COMMUNITY)
Admission: RE | Disposition: A | Discharge status: Home / Self Care | Payer: Self-pay | Source: Home / Self Care | Attending: Internal Medicine

## 2023-08-30 ENCOUNTER — Encounter (HOSPITAL_COMMUNITY): Payer: Self-pay | Admitting: Internal Medicine

## 2023-08-30 ENCOUNTER — Ambulatory Visit (HOSPITAL_COMMUNITY)
Admission: RE | Admit: 2023-08-30 | Discharge: 2023-08-30 | Disposition: A | Discharge status: Home / Self Care | Attending: Internal Medicine | Admitting: Internal Medicine

## 2023-08-30 DIAGNOSIS — I25118 Atherosclerotic heart disease of native coronary artery with other forms of angina pectoris: Secondary | ICD-10-CM | POA: Diagnosis present

## 2023-08-30 DIAGNOSIS — N186 End stage renal disease: Secondary | ICD-10-CM | POA: Diagnosis not present

## 2023-08-30 DIAGNOSIS — Z794 Long term (current) use of insulin: Secondary | ICD-10-CM | POA: Diagnosis not present

## 2023-08-30 DIAGNOSIS — Z7902 Long term (current) use of antithrombotics/antiplatelets: Secondary | ICD-10-CM | POA: Diagnosis not present

## 2023-08-30 DIAGNOSIS — Z79899 Other long term (current) drug therapy: Secondary | ICD-10-CM | POA: Insufficient documentation

## 2023-08-30 DIAGNOSIS — Z955 Presence of coronary angioplasty implant and graft: Secondary | ICD-10-CM

## 2023-08-30 DIAGNOSIS — Z992 Dependence on renal dialysis: Secondary | ICD-10-CM | POA: Diagnosis not present

## 2023-08-30 DIAGNOSIS — Z7982 Long term (current) use of aspirin: Secondary | ICD-10-CM | POA: Insufficient documentation

## 2023-08-30 DIAGNOSIS — I700 Atherosclerosis of aorta: Secondary | ICD-10-CM | POA: Insufficient documentation

## 2023-08-30 DIAGNOSIS — G4733 Obstructive sleep apnea (adult) (pediatric): Secondary | ICD-10-CM | POA: Diagnosis not present

## 2023-08-30 DIAGNOSIS — I5022 Chronic systolic (congestive) heart failure: Secondary | ICD-10-CM | POA: Insufficient documentation

## 2023-08-30 DIAGNOSIS — E1022 Type 1 diabetes mellitus with diabetic chronic kidney disease: Secondary | ICD-10-CM | POA: Insufficient documentation

## 2023-08-30 DIAGNOSIS — E785 Hyperlipidemia, unspecified: Secondary | ICD-10-CM | POA: Insufficient documentation

## 2023-08-30 DIAGNOSIS — I2510 Atherosclerotic heart disease of native coronary artery without angina pectoris: Secondary | ICD-10-CM | POA: Diagnosis present

## 2023-08-30 HISTORY — PX: LEFT HEART CATH AND CORONARY ANGIOGRAPHY: CATH118249

## 2023-08-30 HISTORY — PX: CORONARY STENT INTERVENTION: CATH118234

## 2023-08-30 HISTORY — PX: CORONARY IMAGING/OCT: CATH118326

## 2023-08-30 LAB — POCT I-STAT 7, (LYTES, BLD GAS, ICA,H+H)
Acid-base deficit: 8 mmol/L — ABNORMAL HIGH (ref 0.0–2.0)
Bicarbonate: 21.1 mmol/L (ref 20.0–28.0)
Calcium, Ion: 1.11 mmol/L — ABNORMAL LOW (ref 1.15–1.40)
HCT: 37 % (ref 36.0–46.0)
Hemoglobin: 12.6 g/dL (ref 12.0–15.0)
O2 Saturation: 91 %
Potassium: 4.2 mmol/L (ref 3.5–5.1)
Sodium: 110 mmol/L — CL (ref 135–145)
TCO2: 23 mmol/L (ref 22–32)
pCO2 arterial: 55.3 mmHg — ABNORMAL HIGH (ref 32–48)
pH, Arterial: 7.188 — CL (ref 7.35–7.45)
pO2, Arterial: 76 mmHg — ABNORMAL LOW (ref 83–108)

## 2023-08-30 LAB — GLUCOSE, CAPILLARY
Glucose-Capillary: 218 mg/dL — ABNORMAL HIGH (ref 70–99)
Glucose-Capillary: 516 mg/dL (ref 70–99)
Glucose-Capillary: 404 mg/dL — ABNORMAL HIGH (ref 70–99)

## 2023-08-30 LAB — POCT ACTIVATED CLOTTING TIME: Activated Clotting Time: 222 s

## 2023-08-30 SURGERY — CORONARY STENT INTERVENTION
Anesthesia: LOCAL

## 2023-08-30 MED ORDER — SODIUM CHLORIDE 0.9 % IV SOLN: INTRAVENOUS | Status: DC

## 2023-08-30 MED ORDER — SODIUM CHLORIDE 0.9 % IV SOLN: 250.0000 mL | INTRAVENOUS | Status: DC | PRN

## 2023-08-30 MED ORDER — ONDANSETRON HCL 4 MG/2ML IJ SOLN: 4.0000 mg | Freq: Four times a day (QID) | INTRAMUSCULAR | Status: DC | PRN

## 2023-08-30 MED ORDER — SODIUM CHLORIDE 0.9% FLUSH: 3.0000 mL | Freq: Two times a day (BID) | INTRAVENOUS | Status: DC

## 2023-08-30 MED ORDER — LABETALOL HCL 5 MG/ML IV SOLN: 10.0000 mg | INTRAVENOUS | Status: DC | PRN

## 2023-08-30 MED ORDER — SODIUM CHLORIDE 0.9% FLUSH: 3.0000 mL | INTRAVENOUS | Status: DC | PRN

## 2023-08-30 MED ORDER — HEPARIN SODIUM (PORCINE) 1000 UNIT/ML IJ SOLN
INTRAMUSCULAR | Status: AC
  Filled 2023-08-30: qty 10

## 2023-08-30 MED ORDER — FENTANYL CITRATE (PF) 100 MCG/2ML IJ SOLN
INTRAMUSCULAR | Status: AC
  Filled 2023-08-30: qty 2

## 2023-08-30 MED ORDER — ASPIRIN 81 MG PO CHEW: 81.0000 mg | CHEWABLE_TABLET | ORAL | Status: DC

## 2023-08-30 MED ORDER — IOHEXOL 350 MG/ML SOLN
INTRAVENOUS | Status: DC | PRN
  Administered 2023-08-30: 230 mL

## 2023-08-30 MED ORDER — MIDAZOLAM HCL 2 MG/2ML IJ SOLN
INTRAMUSCULAR | Status: AC
  Filled 2023-08-30: qty 2

## 2023-08-30 MED ORDER — LIDOCAINE HCL (PF) 1 % IJ SOLN
INTRAMUSCULAR | Status: AC
  Filled 2023-08-30: qty 30

## 2023-08-30 MED ORDER — HEPARIN SODIUM (PORCINE) 1000 UNIT/ML IJ SOLN
INTRAMUSCULAR | Status: DC | PRN
  Administered 2023-08-30: 8000 [IU] via INTRAVENOUS
  Administered 2023-08-30: 1000 [IU] via INTRAVENOUS

## 2023-08-30 MED ORDER — NITROGLYCERIN 1 MG/10 ML FOR IR/CATH LAB
INTRA_ARTERIAL | Status: AC
  Filled 2023-08-30: qty 10

## 2023-08-30 MED ORDER — VERAPAMIL HCL 2.5 MG/ML IV SOLN
INTRAVENOUS | Status: AC
  Filled 2023-08-30: qty 2

## 2023-08-30 MED ORDER — CLOPIDOGREL BISULFATE 75 MG PO TABS: 75.0000 mg | ORAL_TABLET | Freq: Once | ORAL | Status: DC

## 2023-08-30 MED ORDER — CLOPIDOGREL BISULFATE 75 MG PO TABS: 75.0000 mg | ORAL_TABLET | ORAL | Status: DC

## 2023-08-30 MED ORDER — ASPIRIN 81 MG PO CHEW: 81.0000 mg | CHEWABLE_TABLET | Freq: Every day | ORAL | Status: DC

## 2023-08-30 MED ORDER — FENTANYL CITRATE (PF) 100 MCG/2ML IJ SOLN
INTRAMUSCULAR | Status: DC | PRN
  Administered 2023-08-30: 25 ug via INTRAVENOUS

## 2023-08-30 MED ORDER — LIDOCAINE HCL (PF) 1 % IJ SOLN
INTRAMUSCULAR | Status: DC | PRN
  Administered 2023-08-30: 5 mL via INTRADERMAL

## 2023-08-30 MED ORDER — ACETAMINOPHEN 325 MG PO TABS: 650.0000 mg | ORAL_TABLET | ORAL | Status: DC | PRN

## 2023-08-30 MED ORDER — VERAPAMIL HCL 2.5 MG/ML IV SOLN
INTRAVENOUS | Status: DC | PRN
  Administered 2023-08-30: 10 mL via INTRA_ARTERIAL

## 2023-08-30 MED ORDER — CLOPIDOGREL BISULFATE 75 MG PO TABS: 75.0000 mg | ORAL_TABLET | Freq: Every day | ORAL | Status: DC

## 2023-08-30 MED ORDER — HEPARIN (PORCINE) IN NACL 1000-0.9 UT/500ML-% IV SOLN
INTRAVENOUS | Status: DC | PRN
  Administered 2023-08-30 (×2): 500 mL

## 2023-08-30 MED ORDER — HEPARIN SODIUM (PORCINE) 1000 UNIT/ML IJ SOLN
INTRAMUSCULAR | Status: DC | PRN
  Administered 2023-08-30: 5000 [IU] via INTRAVENOUS

## 2023-08-30 MED ORDER — MIDAZOLAM HCL 2 MG/2ML IJ SOLN
INTRAMUSCULAR | Status: DC | PRN
  Administered 2023-08-30: 1 mg via INTRAVENOUS

## 2023-08-30 MED ORDER — HYDRALAZINE HCL 20 MG/ML IJ SOLN: 10.0000 mg | INTRAMUSCULAR | Status: DC | PRN

## 2023-08-30 SURGICAL SUPPLY — 35 items
BALL SAPPHIRE NC24 2.75X8 (BALLOONS) ×1 IMPLANT
BALL SAPPHIRE NC24 3.75X12 (BALLOONS) ×1 IMPLANT
BALLN EMERGE MR 2.0X12 (BALLOONS) ×1 IMPLANT
BALLN EMERGE MR 2.0X15 (BALLOONS) ×1 IMPLANT
BALLN EMERGE MR 2.5X15 (BALLOONS) ×1 IMPLANT
BALLN ~~LOC~~ EMERGE MR 2.75X20 (BALLOONS) ×1 IMPLANT
BALLOON EMERGE MR 2.0X12 (BALLOONS) IMPLANT
BALLOON EMERGE MR 2.0X15 (BALLOONS) IMPLANT
BALLOON EMERGE MR 2.5X15 (BALLOONS) IMPLANT
BALLOON SAPPHIRE NC24 2.75X8 (BALLOONS) IMPLANT
BALLOON SAPPHIRE NC24 3.75X12 (BALLOONS) IMPLANT
BALLOON ~~LOC~~ EMERGE MR 2.75X20 (BALLOONS) IMPLANT
CATH DRAGONFLY OPSTAR (CATHETERS) IMPLANT
CATH INFINITI 5FR ANG PIGTAIL (CATHETERS) IMPLANT
CATH VISTA GUIDE 6FR JR4 ECOPK (CATHETERS) IMPLANT
CATH VISTA GUIDE 6FR XB3.5 EPK (CATHETERS) IMPLANT
DEVICE RAD COMP TR BAND LRG (VASCULAR PRODUCTS) IMPLANT
GLIDESHEATH SLEND SS 6F .021 (SHEATH) IMPLANT
GUIDEWIRE VAS SION BLUE 190 (WIRE) IMPLANT
KIT ENCORE 26 ADVANTAGE (KITS) IMPLANT
KIT HEMO VALVE WATCHDOG (MISCELLANEOUS) IMPLANT
PACK CARDIAC CATHETERIZATION (CUSTOM PROCEDURE TRAY) ×1 IMPLANT
SET ATX-X65L (MISCELLANEOUS) IMPLANT
SHEATH 6FR 75 DEST SLENDER (SHEATH) IMPLANT
STENT SYNERGY XD 2.25X12 (Permanent Stent) IMPLANT
STENT SYNERGY XD 2.50X12 (Permanent Stent) IMPLANT
STENT SYNERGY XD 2.50X38 (Permanent Stent) IMPLANT
STENT SYNERGY XD 3.50X16 (Permanent Stent) IMPLANT
SYNERGY XD 2.25X12 (Permanent Stent) ×1 IMPLANT
SYNERGY XD 2.50X12 (Permanent Stent) ×1 IMPLANT
SYNERGY XD 2.50X38 (Permanent Stent) ×1 IMPLANT
SYNERGY XD 3.50X16 (Permanent Stent) ×1 IMPLANT
TUBING CIL FLEX 10 FLL-RA (TUBING) IMPLANT
WIRE ASAHI PROWATER 180CM (WIRE) IMPLANT
WIRE EMERALD 3MM-J .035X260CM (WIRE) IMPLANT

## 2023-08-30 NOTE — Discharge Instructions (Signed)

## 2023-08-30 NOTE — Interval H&P Note (Signed)
 History and Physical Interval Note:  08/30/2023 9:08 AM  Jasmine [name]  has presented today for surgery, with the diagnosis of CAD.  The various methods of treatment have been discussed with the patient and family. After consideration of risks, benefits and other options for treatment, the patient has consented to  Procedure(s): CORONARY STENT INTERVENTION (N/A) as a surgical intervention.  The patient's history has been reviewed, patient examined, no change in status, stable for surgery.  I have reviewed the patient's chart and labs.  Questions were answered to the patient's satisfaction.     Orbie Pyo
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
## 2023-08-30 NOTE — Progress Notes (Signed)
 CARDIAC REHAB PHASE I     Post stent education including site care, restrictions, risk factors, exercise guidelines, NTG use, antiplatelet therapy importance, heart healthy diabetic diet and CRP2 reviewed. All questions and concerns addressed. Will refer to AP or CRP2. Plan for home later today.    [id number] Woodroe Chen, RN BSN 08/30/2023 2:45 PM

## 2023-08-31 MED FILL — Nitroglycerin IV Soln 100 MCG/ML in D5W: INTRA_ARTERIAL | Qty: 10 | Status: AC

## 2023-09-01 ENCOUNTER — Encounter: Payer: Self-pay | Admitting: Physician Assistant

## 2023-09-01 LAB — POCT ACTIVATED CLOTTING TIME
Activated Clotting Time: 372 s
Activated Clotting Time: 475 s
Activated Clotting Time: 326 s
Activated Clotting Time: 504 s

## 2023-09-05 ENCOUNTER — Emergency Department (HOSPITAL_COMMUNITY)

## 2023-09-05 ENCOUNTER — Other Ambulatory Visit: Payer: Self-pay

## 2023-09-05 ENCOUNTER — Inpatient Hospital Stay (HOSPITAL_COMMUNITY)
Admission: EM | Admit: 2023-09-05 | Discharge: 2023-09-11 | DRG: 871 | Disposition: A | Discharge status: Home / Self Care | Attending: Internal Medicine | Admitting: Internal Medicine

## 2023-09-05 ENCOUNTER — Encounter (HOSPITAL_COMMUNITY): Payer: Self-pay | Admitting: Emergency Medicine

## 2023-09-05 DIAGNOSIS — A4102 Sepsis due to Methicillin resistant Staphylococcus aureus: Secondary | ICD-10-CM | POA: Diagnosis not present

## 2023-09-05 DIAGNOSIS — F3181 Bipolar II disorder: Secondary | ICD-10-CM | POA: Diagnosis present

## 2023-09-05 DIAGNOSIS — B9562 Methicillin resistant Staphylococcus aureus infection as the cause of diseases classified elsewhere: Secondary | ICD-10-CM | POA: Diagnosis not present

## 2023-09-05 DIAGNOSIS — Z794 Long term (current) use of insulin: Secondary | ICD-10-CM

## 2023-09-05 DIAGNOSIS — Z7982 Long term (current) use of aspirin: Secondary | ICD-10-CM

## 2023-09-05 DIAGNOSIS — L03115 Cellulitis of right lower limb: Secondary | ICD-10-CM | POA: Diagnosis present

## 2023-09-05 DIAGNOSIS — I5A Non-ischemic myocardial injury (non-traumatic): Secondary | ICD-10-CM | POA: Diagnosis present

## 2023-09-05 DIAGNOSIS — E1022 Type 1 diabetes mellitus with diabetic chronic kidney disease: Secondary | ICD-10-CM | POA: Diagnosis present

## 2023-09-05 DIAGNOSIS — Z83438 Family history of other disorder of lipoprotein metabolism and other lipidemia: Secondary | ICD-10-CM

## 2023-09-05 DIAGNOSIS — Z807 Family history of other malignant neoplasms of lymphoid, hematopoietic and related tissues: Secondary | ICD-10-CM

## 2023-09-05 DIAGNOSIS — G4733 Obstructive sleep apnea (adult) (pediatric): Secondary | ICD-10-CM | POA: Diagnosis present

## 2023-09-05 DIAGNOSIS — R6520 Severe sepsis without septic shock: Secondary | ICD-10-CM

## 2023-09-05 DIAGNOSIS — I361 Nonrheumatic tricuspid (valve) insufficiency: Secondary | ICD-10-CM | POA: Diagnosis not present

## 2023-09-05 DIAGNOSIS — Z9641 Presence of insulin pump (external) (internal): Secondary | ICD-10-CM | POA: Diagnosis present

## 2023-09-05 DIAGNOSIS — M797 Fibromyalgia: Secondary | ICD-10-CM | POA: Diagnosis present

## 2023-09-05 DIAGNOSIS — R7989 Other specified abnormal findings of blood chemistry: Secondary | ICD-10-CM | POA: Diagnosis present

## 2023-09-05 DIAGNOSIS — R7881 Bacteremia: Secondary | ICD-10-CM | POA: Diagnosis present

## 2023-09-05 DIAGNOSIS — K2100 Gastro-esophageal reflux disease with esophagitis, without bleeding: Secondary | ICD-10-CM | POA: Diagnosis not present

## 2023-09-05 DIAGNOSIS — I10 Essential (primary) hypertension: Secondary | ICD-10-CM | POA: Diagnosis not present

## 2023-09-05 DIAGNOSIS — K219 Gastro-esophageal reflux disease without esophagitis: Secondary | ICD-10-CM | POA: Diagnosis present

## 2023-09-05 DIAGNOSIS — G43709 Chronic migraine without aura, not intractable, without status migrainosus: Secondary | ICD-10-CM | POA: Diagnosis present

## 2023-09-05 DIAGNOSIS — N2581 Secondary hyperparathyroidism of renal origin: Secondary | ICD-10-CM | POA: Diagnosis present

## 2023-09-05 DIAGNOSIS — E1010 Type 1 diabetes mellitus with ketoacidosis without coma: Secondary | ICD-10-CM | POA: Diagnosis present

## 2023-09-05 DIAGNOSIS — Z8349 Family history of other endocrine, nutritional and metabolic diseases: Secondary | ICD-10-CM

## 2023-09-05 DIAGNOSIS — Z8249 Family history of ischemic heart disease and other diseases of the circulatory system: Secondary | ICD-10-CM

## 2023-09-05 DIAGNOSIS — Z825 Family history of asthma and other chronic lower respiratory diseases: Secondary | ICD-10-CM

## 2023-09-05 DIAGNOSIS — Z6839 Body mass index (BMI) 39.0-39.9, adult: Secondary | ICD-10-CM | POA: Diagnosis not present

## 2023-09-05 DIAGNOSIS — Z1152 Encounter for screening for COVID-19: Secondary | ICD-10-CM | POA: Diagnosis not present

## 2023-09-05 DIAGNOSIS — E7800 Pure hypercholesterolemia, unspecified: Secondary | ICD-10-CM | POA: Diagnosis present

## 2023-09-05 DIAGNOSIS — Z992 Dependence on renal dialysis: Secondary | ICD-10-CM | POA: Diagnosis not present

## 2023-09-05 DIAGNOSIS — J45909 Unspecified asthma, uncomplicated: Secondary | ICD-10-CM | POA: Diagnosis present

## 2023-09-05 DIAGNOSIS — E876 Hypokalemia: Secondary | ICD-10-CM | POA: Diagnosis present

## 2023-09-05 DIAGNOSIS — J9601 Acute respiratory failure with hypoxia: Secondary | ICD-10-CM | POA: Diagnosis present

## 2023-09-05 DIAGNOSIS — Z833 Family history of diabetes mellitus: Secondary | ICD-10-CM

## 2023-09-05 DIAGNOSIS — E039 Hypothyroidism, unspecified: Secondary | ICD-10-CM | POA: Diagnosis present

## 2023-09-05 DIAGNOSIS — Z7902 Long term (current) use of antithrombotics/antiplatelets: Secondary | ICD-10-CM

## 2023-09-05 DIAGNOSIS — I2510 Atherosclerotic heart disease of native coronary artery without angina pectoris: Secondary | ICD-10-CM | POA: Diagnosis present

## 2023-09-05 DIAGNOSIS — D631 Anemia in chronic kidney disease: Secondary | ICD-10-CM | POA: Diagnosis present

## 2023-09-05 DIAGNOSIS — Z79899 Other long term (current) drug therapy: Secondary | ICD-10-CM

## 2023-09-05 DIAGNOSIS — N186 End stage renal disease: Secondary | ICD-10-CM | POA: Diagnosis present

## 2023-09-05 DIAGNOSIS — M898X9 Other specified disorders of bone, unspecified site: Secondary | ICD-10-CM | POA: Diagnosis present

## 2023-09-05 DIAGNOSIS — Z87891 Personal history of nicotine dependence: Secondary | ICD-10-CM

## 2023-09-05 DIAGNOSIS — I2489 Other forms of acute ischemic heart disease: Secondary | ICD-10-CM | POA: Diagnosis present

## 2023-09-05 DIAGNOSIS — Z955 Presence of coronary angioplasty implant and graft: Secondary | ICD-10-CM

## 2023-09-05 DIAGNOSIS — Z881 Allergy status to other antibiotic agents status: Secondary | ICD-10-CM

## 2023-09-05 DIAGNOSIS — Z79890 Hormone replacement therapy (postmenopausal): Secondary | ICD-10-CM

## 2023-09-05 DIAGNOSIS — Z8052 Family history of malignant neoplasm of bladder: Secondary | ICD-10-CM

## 2023-09-05 DIAGNOSIS — L02415 Cutaneous abscess of right lower limb: Secondary | ICD-10-CM | POA: Diagnosis present

## 2023-09-05 DIAGNOSIS — Z888 Allergy status to other drugs, medicaments and biological substances status: Secondary | ICD-10-CM

## 2023-09-05 DIAGNOSIS — E7849 Other hyperlipidemia: Secondary | ICD-10-CM | POA: Diagnosis not present

## 2023-09-05 DIAGNOSIS — I132 Hypertensive heart and chronic kidney disease with heart failure and with stage 5 chronic kidney disease, or end stage renal disease: Secondary | ICD-10-CM | POA: Diagnosis present

## 2023-09-05 DIAGNOSIS — A419 Sepsis, unspecified organism: Secondary | ICD-10-CM | POA: Diagnosis not present

## 2023-09-05 DIAGNOSIS — E66812 Obesity, class 2: Secondary | ICD-10-CM | POA: Diagnosis present

## 2023-09-05 DIAGNOSIS — J189 Pneumonia, unspecified organism: Secondary | ICD-10-CM

## 2023-09-05 DIAGNOSIS — J9811 Atelectasis: Secondary | ICD-10-CM | POA: Diagnosis present

## 2023-09-05 DIAGNOSIS — Z818 Family history of other mental and behavioral disorders: Secondary | ICD-10-CM

## 2023-09-05 DIAGNOSIS — R531 Weakness: Secondary | ICD-10-CM | POA: Diagnosis present

## 2023-09-05 HISTORY — DX: Dependence on renal dialysis: N18.6

## 2023-09-05 LAB — URINALYSIS, W/ REFLEX TO CULTURE (INFECTION SUSPECTED)
Bilirubin Urine: NEGATIVE
Glucose, UA: 50 mg/dL — AB
Hgb urine dipstick: NEGATIVE
Ketones, ur: NEGATIVE mg/dL
Leukocytes,Ua: NEGATIVE
Nitrite: NEGATIVE
Protein, ur: >=300 mg/dL — AB
Specific Gravity, Urine: 1.01 (ref 1.005–1.030)
pH: 8 (ref 5.0–8.0)

## 2023-09-05 LAB — RESP PANEL BY RT-PCR (RSV, FLU A&B, COVID)  RVPGX2
Influenza A by PCR: NEGATIVE
Influenza B by PCR: NEGATIVE
Resp Syncytial Virus by PCR: NEGATIVE
SARS Coronavirus 2 by RT PCR: NEGATIVE

## 2023-09-05 LAB — CBC WITH DIFFERENTIAL/PLATELET
Abs Immature Granulocytes: 0.1 10*3/uL — ABNORMAL HIGH (ref 0.00–0.07)
Basophils Absolute: 0 10*3/uL (ref 0.0–0.1)
Basophils Relative: 0 %
Eosinophils Absolute: 0 10*3/uL (ref 0.0–0.5)
Eosinophils Relative: 0 %
HCT: 34 % — ABNORMAL LOW (ref 36.0–46.0)
Hemoglobin: 10.8 g/dL — ABNORMAL LOW (ref 12.0–15.0)
Immature Granulocytes: 1 %
Lymphocytes Relative: 2 %
Lymphs Abs: 0.3 10*3/uL — ABNORMAL LOW (ref 0.7–4.0)
MCH: 30.3 pg (ref 26.0–34.0)
MCHC: 31.8 g/dL (ref 30.0–36.0)
MCV: 95.2 fL (ref 80.0–100.0)
Monocytes Absolute: 0.9 10*3/uL (ref 0.1–1.0)
Monocytes Relative: 6 %
Neutro Abs: 13.1 10*3/uL — ABNORMAL HIGH (ref 1.7–7.7)
Neutrophils Relative %: 91 %
Platelets: 203 10*3/uL (ref 150–400)
RBC: 3.57 MIL/uL — ABNORMAL LOW (ref 3.87–5.11)
RDW: 13.2 % (ref 11.5–15.5)
WBC: 14.4 10*3/uL — ABNORMAL HIGH (ref 4.0–10.5)
nRBC: 0 % (ref 0.0–0.2)

## 2023-09-05 LAB — COMPREHENSIVE METABOLIC PANEL WITH GFR
ALT: 38 U/L (ref 0–44)
AST: 73 U/L — ABNORMAL HIGH (ref 15–41)
Albumin: 2.9 g/dL — ABNORMAL LOW (ref 3.5–5.0)
Alkaline Phosphatase: 118 U/L (ref 38–126)
Anion gap: 13 (ref 5–15)
BUN: 34 mg/dL — ABNORMAL HIGH (ref 6–20)
CO2: 23 mmol/L (ref 22–32)
Calcium: 9.3 mg/dL (ref 8.9–10.3)
Chloride: 97 mmol/L — ABNORMAL LOW (ref 98–111)
Creatinine, Ser: 5.55 mg/dL — ABNORMAL HIGH (ref 0.44–1.00)
GFR, Estimated: 9 mL/min — ABNORMAL LOW (ref >60)
Glucose, Bld: 155 mg/dL — ABNORMAL HIGH (ref 70–99)
Potassium: 3.8 mmol/L (ref 3.5–5.1)
Sodium: 133 mmol/L — ABNORMAL LOW (ref 135–145)
Total Bilirubin: 0.6 mg/dL (ref 0.0–1.2)
Total Protein: 6.4 g/dL — ABNORMAL LOW (ref 6.5–8.1)

## 2023-09-05 LAB — PROTIME-INR
INR: 1 (ref 0.8–1.2)
Prothrombin Time: 13.8 s (ref 11.4–15.2)

## 2023-09-05 LAB — TROPONIN I (HIGH SENSITIVITY)
Troponin I (High Sensitivity): 273 ng/L (ref <18)
Troponin I (High Sensitivity): 626 ng/L (ref <18)
Troponin I (High Sensitivity): 1152 ng/L (ref <18)

## 2023-09-05 LAB — MRSA NEXT GEN BY PCR, NASAL: MRSA by PCR Next Gen: DETECTED — AB

## 2023-09-05 LAB — GLUCOSE, CAPILLARY
Glucose-Capillary: 105 mg/dL — ABNORMAL HIGH (ref 70–99)
Glucose-Capillary: 374 mg/dL — ABNORMAL HIGH (ref 70–99)
Glucose-Capillary: 429 mg/dL — ABNORMAL HIGH (ref 70–99)

## 2023-09-05 LAB — STREP PNEUMONIAE URINARY ANTIGEN: Strep Pneumo Urinary Antigen: NEGATIVE

## 2023-09-05 LAB — LACTIC ACID, PLASMA: Lactic Acid, Venous: 1.2 mmol/L (ref 0.5–1.9)

## 2023-09-05 LAB — APTT: aPTT: 22 s — ABNORMAL LOW (ref 24–36)

## 2023-09-05 LAB — CBG MONITORING, ED: Glucose-Capillary: 162 mg/dL — ABNORMAL HIGH (ref 70–99)

## 2023-09-05 LAB — PROCALCITONIN: Procalcitonin: 11.17 ng/mL

## 2023-09-05 MED ORDER — OXYCODONE-ACETAMINOPHEN 5-325 MG PO TABS
1.0000 | ORAL_TABLET | Freq: Every day | ORAL | Status: DC | PRN
  Administered 2023-09-09 – 2023-09-10 (×2): 1 via ORAL
  Filled 2023-09-05 (×3): qty 1

## 2023-09-05 MED ORDER — LEVOTHYROXINE SODIUM 75 MCG PO TABS
175.0000 ug | ORAL_TABLET | Freq: Every day | ORAL | Status: DC
  Administered 2023-09-06 – 2023-09-11 (×5): 175 ug via ORAL
  Filled 2023-09-05 (×6): qty 1

## 2023-09-05 MED ORDER — MELATONIN 3 MG PO TABS
9.0000 mg | ORAL_TABLET | Freq: Every day | ORAL | Status: DC
  Administered 2023-09-06 – 2023-09-10 (×5): 9 mg via ORAL
  Filled 2023-09-05 (×7): qty 3

## 2023-09-05 MED ORDER — CLOPIDOGREL BISULFATE 75 MG PO TABS
75.0000 mg | ORAL_TABLET | Freq: Every day | ORAL | Status: DC
  Administered 2023-09-05 – 2023-09-11 (×7): 75 mg via ORAL
  Filled 2023-09-05 (×7): qty 1

## 2023-09-05 MED ORDER — MUPIROCIN 2 % EX OINT
1.0000 | TOPICAL_OINTMENT | Freq: Two times a day (BID) | CUTANEOUS | Status: AC
  Administered 2023-09-05 – 2023-09-10 (×10): 1 via NASAL
  Filled 2023-09-05 (×4): qty 22

## 2023-09-05 MED ORDER — LORAZEPAM 0.5 MG PO TABS
0.5000 mg | ORAL_TABLET | Freq: Every day | ORAL | Status: DC | PRN
  Administered 2023-09-06: 0.5 mg via ORAL
  Filled 2023-09-05: qty 1

## 2023-09-05 MED ORDER — SODIUM CHLORIDE 0.9 % IV SOLN
1.0000 g | INTRAVENOUS | Status: DC
  Administered 2023-09-06: 1 g via INTRAVENOUS
  Filled 2023-09-05: qty 10

## 2023-09-05 MED ORDER — ONDANSETRON HCL 4 MG/2ML IJ SOLN
4.0000 mg | Freq: Four times a day (QID) | INTRAMUSCULAR | Status: DC | PRN
  Administered 2023-09-06 – 2023-09-10 (×5): 4 mg via INTRAVENOUS
  Filled 2023-09-05 (×5): qty 2

## 2023-09-05 MED ORDER — ASPIRIN 81 MG PO TBEC
81.0000 mg | DELAYED_RELEASE_TABLET | Freq: Every day | ORAL | Status: DC
  Administered 2023-09-05 – 2023-09-10 (×6): 81 mg via ORAL
  Filled 2023-09-05 (×6): qty 1

## 2023-09-05 MED ORDER — BUPROPION HCL ER (XL) 150 MG PO TB24
300.0000 mg | ORAL_TABLET | Freq: Every evening | ORAL | Status: DC
  Administered 2023-09-05 – 2023-09-10 (×6): 300 mg via ORAL
  Filled 2023-09-05 (×2): qty 2
  Filled 2023-09-05: qty 1
  Filled 2023-09-05 (×3): qty 2
  Filled 2023-09-05: qty 1

## 2023-09-05 MED ORDER — LAMOTRIGINE 100 MG PO TABS
200.0000 mg | ORAL_TABLET | Freq: Every evening | ORAL | Status: DC
  Administered 2023-09-05 – 2023-09-10 (×6): 200 mg via ORAL
  Filled 2023-09-05 (×7): qty 2

## 2023-09-05 MED ORDER — DOXYCYCLINE HYCLATE 100 MG PO TABS
100.0000 mg | ORAL_TABLET | Freq: Two times a day (BID) | ORAL | Status: DC
  Administered 2023-09-05: 100 mg via ORAL
  Filled 2023-09-05 (×2): qty 1

## 2023-09-05 MED ORDER — ACETAMINOPHEN 650 MG RE SUPP: 650.0000 mg | Freq: Four times a day (QID) | RECTAL | Status: DC | PRN

## 2023-09-05 MED ORDER — SODIUM CHLORIDE 0.9% FLUSH
3.0000 mL | Freq: Two times a day (BID) | INTRAVENOUS | Status: DC
  Administered 2023-09-05 – 2023-09-11 (×12): 3 mL via INTRAVENOUS

## 2023-09-05 MED ORDER — SODIUM CHLORIDE 0.9 % IV SOLN
100.0000 mg | Freq: Once | INTRAVENOUS | Status: AC
  Administered 2023-09-05: 100 mg via INTRAVENOUS
  Filled 2023-09-05: qty 100

## 2023-09-05 MED ORDER — INSULIN ASPART 100 UNIT/ML IJ SOLN
0.0000 [IU] | INTRAMUSCULAR | Status: DC
  Administered 2023-09-05: 20 [IU] via SUBCUTANEOUS
  Administered 2023-09-06 (×2): 7 [IU] via SUBCUTANEOUS

## 2023-09-05 MED ORDER — HEPARIN SODIUM (PORCINE) 5000 UNIT/ML IJ SOLN
5000.0000 [IU] | Freq: Three times a day (TID) | INTRAMUSCULAR | Status: AC
  Administered 2023-09-05 – 2023-09-09 (×13): 5000 [IU] via SUBCUTANEOUS
  Filled 2023-09-05 (×13): qty 1

## 2023-09-05 MED ORDER — FOLIC ACID 1 MG PO TABS
1.0000 mg | ORAL_TABLET | Freq: Every morning | ORAL | Status: DC
  Administered 2023-09-06 – 2023-09-11 (×6): 1 mg via ORAL
  Filled 2023-09-05 (×7): qty 1

## 2023-09-05 MED ORDER — LIDOCAINE-EPINEPHRINE (PF) 2 %-1:200000 IJ SOLN
20.0000 mL | Freq: Once | INTRAMUSCULAR | Status: AC
  Administered 2023-09-05: 20 mL via INTRADERMAL
  Filled 2023-09-05: qty 20

## 2023-09-05 MED ORDER — ALBUTEROL SULFATE (2.5 MG/3ML) 0.083% IN NEBU: 3.0000 mL | INHALATION_SOLUTION | Freq: Four times a day (QID) | RESPIRATORY_TRACT | Status: DC | PRN

## 2023-09-05 MED ORDER — INSULIN PUMP
Freq: Three times a day (TID) | SUBCUTANEOUS | Status: DC
  Filled 2023-09-05: qty 1

## 2023-09-05 MED ORDER — SODIUM CHLORIDE 0.9 % IV SOLN
1.0000 g | Freq: Once | INTRAVENOUS | Status: AC
  Administered 2023-09-05: 1 g via INTRAVENOUS
  Filled 2023-09-05: qty 10

## 2023-09-05 MED ORDER — ONDANSETRON HCL 4 MG PO TABS
4.0000 mg | ORAL_TABLET | Freq: Four times a day (QID) | ORAL | Status: DC | PRN
  Administered 2023-09-06 – 2023-09-11 (×3): 4 mg via ORAL
  Filled 2023-09-05 (×3): qty 1

## 2023-09-05 MED ORDER — ACETAMINOPHEN 325 MG PO TABS
650.0000 mg | ORAL_TABLET | Freq: Four times a day (QID) | ORAL | Status: DC | PRN
  Administered 2023-09-05 – 2023-09-10 (×5): 650 mg via ORAL
  Filled 2023-09-05 (×5): qty 2

## 2023-09-05 MED ORDER — ROSUVASTATIN CALCIUM 5 MG PO TABS
10.0000 mg | ORAL_TABLET | Freq: Every evening | ORAL | Status: DC
  Administered 2023-09-05 – 2023-09-10 (×6): 10 mg via ORAL
  Filled 2023-09-05: qty 1
  Filled 2023-09-05: qty 2
  Filled 2023-09-05 (×3): qty 1
  Filled 2023-09-05: qty 2
  Filled 2023-09-05: qty 1

## 2023-09-05 MED ORDER — DICLOFENAC SODIUM 1 % EX GEL
2.0000 g | Freq: Four times a day (QID) | CUTANEOUS | Status: DC | PRN
Start: 2023-09-05 — End: 2023-09-11

## 2023-09-05 MED ORDER — SEVELAMER CARBONATE 800 MG PO TABS
1600.0000 mg | ORAL_TABLET | Freq: Three times a day (TID) | ORAL | Status: DC
  Administered 2023-09-05 – 2023-09-09 (×7): 1600 mg via ORAL
  Filled 2023-09-05 (×12): qty 2

## 2023-09-05 MED ORDER — CHLORHEXIDINE GLUCONATE CLOTH 2 % EX PADS
6.0000 | MEDICATED_PAD | Freq: Every day | CUTANEOUS | Status: DC
  Administered 2023-09-06 – 2023-09-09 (×3): 6 via TOPICAL

## 2023-09-05 MED ORDER — ACETAMINOPHEN 500 MG PO TABS
1000.0000 mg | ORAL_TABLET | Freq: Once | ORAL | Status: AC
  Administered 2023-09-05: 1000 mg via ORAL
  Filled 2023-09-05: qty 2

## 2023-09-05 NOTE — H&P (Signed)
 History and Physical    Patient: Jasmine [name] GEX:[id number] DOB: 1970/09/27 DOA: 09/05/2023 DOS: the patient was seen and examined on 09/05/2023 PCP: Richmond Campbell., PA-C  Patient coming from: Home  Chief Complaint:  Chief Complaint  Patient presents with   Weakness   HPI: Jasmine [name] is a 53 y.o. female with a history of T1DM on insulin pump, CAD s/p DES x4 on 08/30/2023, ESRD (on HD TTS thru right IJ TDC, OSA on 3L nocturnal O2, obesity, HTN, HLD, bipolar disorder, IBS and recent I&D of right thigh abscess (3/28) who presented by EMD from home with feeling ill, weak, fatigued starting the night before associated with fever of 101.51F and shaking chills. She also has a new cough, though no shortness of breath or chest pain. She also has a headache. No sick contacts. Symptoms have been constant and worsening at home.   In the ED she was febrile with leukocytosis (WBC 14.4k; PMN 13.1k with leftward shift), tachypneic to 24/min, placed on supplemental oxygen (not on this at baseline). HR and BP were stable. Lactic acid normal at 1.2. Initial troponin 273, delta pending, though pt denies chest pain and ECG shows no ST deviations. CXR demonstrated a right hilar opacity.   IV antibiotics were started, blood cultures drawn, and admission requested. The patient is also noted to have mild residual abscess and cellulitis of an area on the right thigh for which EDP is planning repeat I&D.   Review of Systems: As mentioned in the history of present illness. All other systems reviewed and are negative. Past Medical History:  Diagnosis Date   Anxiety    Arthritis    Asthma    Balance problems    Bipolar disorder (HCC)    Charcot ankle    Chronic fatigue    Chronic kidney disease    STAGE 3-4   Depression    Diabetes mellitus    DKA, type 1 (HCC) 11/04/2011   Elevated cholesterol    Fibromyalgia    GERD (gastroesophageal reflux disease)    Headache    History of suicidal ideation     Hyperlipemia    Hypertension    Hypothyroidism    IBS (irritable bowel syndrome)    Memory changes    Obesity    Sleep apnea    HAS C -PAP / DOES NOT USE   Stress incontinence    Pt had surgery to correct this.   Tachycardia    Tobacco abuse    Tremor    UTI (lower urinary tract infection)    Past Surgical History:  Procedure Laterality Date   CORONARY IMAGING/OCT N/A 08/30/2023   Procedure: CORONARY IMAGING/OCT;  Surgeon: Orbie Pyo, MD;  [facility]: [facility];  Service: Cardiovascular;  Laterality: N/A;   CORONARY STENT INTERVENTION N/A 08/30/2023   Procedure: CORONARY STENT INTERVENTION;  Surgeon: Orbie Pyo, MD;  Location: [facility];  Service: Cardiovascular;  Laterality: N/A;   INCONTINENCE SURGERY     LEFT HEART CATH AND CORONARY ANGIOGRAPHY N/A 07/15/2023   Procedure: LEFT HEART CATH AND CORONARY ANGIOGRAPHY;  Surgeon: Orbie Pyo, MD;  [facility]: [facility];  Service: Cardiovascular;  Laterality: N/A;   LEFT HEART CATH AND CORONARY ANGIOGRAPHY N/A 08/30/2023   Procedure: LEFT HEART CATH AND CORONARY ANGIOGRAPHY;  Surgeon: Orbie Pyo, MD;  [facility]: [facility];  Service: Cardiovascular;  Laterality: N/A;   NASAL FRACTURE SURGERY     ovary removed  OVARY SURGERY     PUBOVAGINAL SLING  08/16/2011   Procedure: Leonides Grills;  Surgeon: Valetta Fuller, MD;  Location: [facility];  Service: Urology;  Laterality: N/A;          UTERINE FIBROID SURGERY  2001   Social History:  reports that she quit smoking about 12 years ago. Her smoking use included cigarettes. She started smoking about 32 years ago. She has a 15 pack-year smoking history. She has never used smokeless tobacco. She reports that she does not drink alcohol and does not use drugs.  Allergies  Allergen Reactions   Ciprofloxacin Swelling and Other (See Comments)    Per pt caused lips swell and nauseous feeling   Levaquin [Levofloxacin] Swelling and Other (See  Comments)    Per pt caused lips swell and nauseous feeling   Buspar [Buspirone] Other (See Comments)    abd cramping   Promethazine Other (See Comments)    Completely wipes out/fatigue   Linaclotide Other (See Comments)    Cause severe dehydration    Advair Diskus [Fluticasone-Salmeterol] Other (See Comments)    Thrush    Biaxin [Clarithromycin] Rash   Hydroxyzine Palpitations    Family History  Problem Relation Age of Onset   Asthma Mother    Bipolar disorder Mother    Heart disease Father    Lymphoma Father    Hypertension Father    Thyroid disease Father    Hyperlipidemia Father    Diabetes Father    Cancer Paternal Grandmother        lung and breast   Bladder Cancer Paternal Grandfather    Suicidality Maternal Grandfather    Thyroid disease Brother     Prior to Admission medications   Medication Sig Start Date End Date Taking? Authorizing Provider  acetaminophen (TYLENOL) 500 MG tablet Take 500-1,000 mg by mouth every 6 (six) hours as needed (pain.).    [provider]  albuterol (VENTOLIN HFA) 108 (90 Base) MCG/ACT inhaler Inhale 1-2 puffs into the lungs every 6 (six) hours as needed for wheezing or shortness of breath. 04/07/23   Oretha Milch, MD  Ascorbic Acid (VITAMIN C PO) Take 1 tablet by mouth in the morning.    [provider]  aspirin EC 81 MG tablet Take 81 mg by mouth at bedtime. Swallow whole.    [provider]  azelastine (ASTELIN) 0.1 % nasal spray Place 2 sprays into both nostrils 2 (two) times daily. Use in each nostril as directed Patient taking differently: Place 2 sprays into both nostrils in the morning. Use in each nostril as directed 08/01/23   Glenford Bayley, NP  buPROPion (WELLBUTRIN XL) 300 MG 24 hr tablet Take 1 tablet (300 mg total) by mouth daily. Patient taking differently: Take 300 mg by mouth every evening. 03/23/23 09/19/23  Neysa Hotter, MD  carvedilol (COREG) 12.5 MG tablet Take 12.5 mg by mouth 2 (two)  times daily with a meal. 01/17/23 01/17/24  [provider]  Cholecalciferol (VITAMIN D-3 PO) Take 1 tablet by mouth in the morning.    [provider]  clopidogrel (PLAVIX) 75 MG tablet Take 1 tablet (75 mg total) by mouth daily. 02/22/23   Alver Sorrow, NP  Continuous Blood Gluc Sensor MISC 1 each by Does not apply route as directed. Use as directed every 14 days. May dispense FreeStyle [employer] or similar.    [provider]  diclofenac Sodium (VOLTAREN) 1 % GEL Apply 2 g topically  4 (four) times daily as needed (pain.).    [provider]  dicyclomine (BENTYL) 10 MG capsule Take 10 mg by mouth 2 (two) times daily as needed for spasms. 12/25/22   [provider]  DULoxetine (CYMBALTA) 30 MG capsule Take 1 capsule (30 mg total) by mouth daily. 07/03/23 10/31/23  Neysa Hotter, MD  DULoxetine (CYMBALTA) 60 MG capsule Take 60 mg by mouth in the morning.    [provider]  folic acid (FOLVITE) 800 MCG tablet Take 800 mcg by mouth in the morning.    [provider]  glucagon (GLUCAGEN HYPOKIT) 1 MG SOLR injection Inject 1 mg into the skin once as needed for up to 1 dose for low blood sugar. GlucaGen HypoKit 1 mg Injection 03/31/23   Johnson, Clanford L, MD  hydrALAZINE (APRESOLINE) 25 MG tablet Take 25 mg by mouth 2 (two) times daily. 05/03/23   [provider]  insulin detemir (LEVEMIR) 100 UNIT/ML injection Inject 27 Units into the skin 2 (two) times daily as needed (if insulin pump inoperable). 04/04/23   [provider]  lamoTRIgine (LAMICTAL) 200 MG tablet Take 1 tablet (200 mg total) by mouth daily. Patient taking differently: Take 200 mg by mouth every evening. 04/24/23 10/21/23  Neysa Hotter, MD  levothyroxine (SYNTHROID) 175 MCG tablet Take 1.5 tablets (mcg) by mouth on Sundays in the mornings & take 1 tablet (175 mcg) by mouth on all other days. 11/20/21   [provider]  LORazepam (ATIVAN) 0.5  MG tablet Take 1 tablet (0.5 mg total) by mouth daily as needed for anxiety. Patient taking differently: Take 0.5 mg by mouth daily as needed for anxiety or sleep. 07/04/23 10/02/23  Neysa Hotter, MD  Melatonin 10 MG TABS Take 10 mg by mouth at bedtime.    [provider]  NOVOLOG 100 UNIT/ML injection Inject into the skin continuous. Sliding Scale Insulin pump 2.5 units basal Bolus with meal depending on the size 03/15/22   [provider]  ondansetron (ZOFRAN-ODT) 4 MG disintegrating tablet Dissolve 1 tablet (4 mg total) by mouth every 8 (eight) hours as needed. 03/17/22   [provider]  OVER THE COUNTER MEDICATION Apply 1 application  topically at bedtime as needed (Sleep). CBD oil    [provider]  oxyCODONE-acetaminophen (PERCOCET/ROXICET) 5-325 MG tablet Take 1 tablet by mouth daily as needed (pain.).    [provider]  OXYGEN Inhale 3 L into the lungs at bedtime.    [provider]  rizatriptan (MAXALT) 10 MG tablet Take 10 mg by mouth as needed for migraine. May repeat in 2 hours if needed    [provider]  rosuvastatin (CRESTOR) 10 MG tablet Take 1 tablet (10 mg total) by mouth daily. Patient taking differently: Take 10 mg by mouth every evening. 07/29/23 10/27/23  Jodelle Red, MD  sevelamer carbonate (RENVELA) 800 MG tablet Take 1,600 mg by mouth 3 (three) times daily with meals. 07/28/23   [provider]  torsemide (DEMADEX) 20 MG tablet TAKE TWO TABLETS BY MOUTH AS DIRECTED ON non dialysis DAYS    [provider]    Physical Exam: Vitals:   09/05/23 1145 09/05/23 1200 09/05/23 1350 09/05/23 1407  BP: (!) 112/59 (!) 106/56 (!) 75/36 (!) 110/53  Pulse: 86 84 71 70  Resp:   18 19  Temp:   98.2 F (36.8 C)   TempSrc:   Oral   SpO2: 90% 97% 95% 96%  Weight:  Height:      Gen: No distress, resting quietly Pulm: No wheezing or crackles, SpO2 low 90%'s on 4L O2, still tachypneic with  normal respiratory effort and intermittent dry sounding cough.   CV: RRR, no MRG, no pitting edema or JVD. GI: Soft, NT, ND, +BS Neuro: Alert and oriented. No new focal deficits. Ext: Warm, no deformities.  Skin: Right TDC site is nontender without discharge, bleeding or erythema. Right medial thigh has ~5cm area of tender induration and central punctum with slough and partial eschar, mild fluctuance, with surrounding erythema. Right radial site is healed.   Data Reviewed: WBC 14.4k, hgb 10.8g/dl, plt 629B Troponin 284 > 626 LA 1.2 Na 133, K 3.8, bicarb 23, BUN 34, Cr 5.55 AST 73 Albumin 2.9 Flu, RSV, covid-19 PCR: Negative CXR: Right hilar fullness/opacity.  ECG: Initial w/NSR, submillimeter ST depressions particularly in middle precordial leads, stable TWI in aVF. Repeat: No ST segment deviations.   Assessment and Plan: Sepsis due to pneumonia: Fever, leukocytosis, pulmonary opacity, cough, hypoxemia.  - Continue Doxycycline, ceftriaxone  - Send urine strep and legionella Ag if urine is produced - Send sputum culture if specimen can be expectorated - Monitor blood cultures - Re: alternative sources, abscess/cellulitis does not currently appear significant enough to cause sepsis but will be watched closely. No other localizing symptoms/signs, TDC looks good.  - Tylenol prn  CAD s/p DES x4 08/30/2023 with troponin elevation, HTN, HLD:  - Repeat ECG > personally interpreted, the ST changes in initial ECG are resolved, no ischemic features, though troponin rising, so will trend and consult cardiology. No angina. - Continue DAPT - Continue statin - Hold BB, hydralazine given softening BP. Will admit to SDU, consider small volume fluid if hypotensive.  - Note history of migraine, pt specifically says she hasn't taken maxalt. This should be discontinued in light of her ischemic coronary artery disease.  - Jari Favre, PA follow up 4/9 at 10:55am  Acute on chronic hypoxic respiratory  failure (nocturnal O2 chronically):  - Continue supplemental oxygen to maintain normal WOB and SpO2 >92%.  - Abx as above.  ESRD: Had normal HD Saturday on schedule, no urgent needs.  - Nephrology notified of admission, will need routine HD TTS while admitted. Mention of opacity possibly being pulmonary edema, though does not appear overloaded, will monitor.  - Hold non-HD days torsemide with sepsis as above  Right thigh abscess:  - Repeat I&D, hopefully can send culture, in ED per EDP.  - Will follow up culture results from urgent care I&D 3/28 (none available in care everywhere at this time).   T1DM: Last HbA1c < 6%.  - Pt able to continue insulin pump and monitor CBGs. We will check CBGs regularly  Bipolar disorder: Quiescent.  - Continue home medications including lamictal, wellbutrin, cymbalta, prn ativan  Hypothyroidism:  - Continue home custom synthroid dosing.      Advance Care Planning: Full code  Consults: Cardiology, nephrology  Family Communication: None at bedside  Severity of Illness: The appropriate patient status for this patient is INPATIENT. Inpatient status is judged to be reasonable and necessary in order to provide the required intensity of service to ensure the patient's safety. The patient's presenting symptoms, physical exam findings, and initial radiographic and laboratory data in the context of their chronic comorbidities is felt to place them at high risk for further clinical deterioration. Furthermore, it is not anticipated that the patient will be medically stable for discharge from the hospital within 2  midnights of admission.   * I certify that at the point of admission it is my clinical judgment that the patient will require inpatient hospital care spanning beyond 2 midnights from the point of admission due to high intensity of service, high risk for further deterioration and high frequency of surveillance required.*  Author: Tyrone Nine,  MD 09/05/2023 2:47 PM  For on call review [email or web address].

## 2023-09-05 NOTE — ED Notes (Signed)
 Date and time results received: 09/05/23 11:48 AM  Test: troponin  Critical Value: 273  Name of Provider Notified: dr.[email or web address]  Orders Received? Or Actions Taken?: md notified

## 2023-09-05 NOTE — Consult Note (Addendum)
 CARDIOLOGY CONSULT NOTE    Patient ID: Jasmine [name]; [id number]; 04/29/1971   Admit date: 09/05/2023 Date of Consult: 09/05/2023  Primary Care Provider: Richmond [name]., PA-C Primary Cardiologist:  Primary Electrophysiologist:    History of Present Illness:   [name] is a 53 year old F known to have CAD s/p LAD PCI, RCA PCI, Degele PCI on 08/30/2023 with normal LVEF, HLD.  ESRD DD, diabetes mellitus type 1, OSA, presented to ER with the chief complaint of " feeling sick".  She reported having fever and nausea. Hypoxic upon arrival to the ER.  She has thigh abscess that was drained today. She never complained of any chest pains. Hs troponins are elevated, 273>> 626.  EKG showed NSR, no new ischemia.  She had PCI recently on 08/30/2023, did not miss any doses of DAPT since then.  No angina since then. Normal LVEF on echocardiogram from February 2025.  Chest x-ray on admission showed pulmonary vascular congestion, dialysis patient.  Past Medical History:  Diagnosis Date   Anxiety    Arthritis    Asthma    Balance problems    Bipolar disorder (HCC)    Charcot ankle    Chronic fatigue    Chronic kidney disease    STAGE 3-4   Depression    Diabetes mellitus    DKA, type 1 (HCC) 11/04/2011   Elevated cholesterol    Fibromyalgia    GERD (gastroesophageal reflux disease)    Headache    History of suicidal ideation    Hyperlipemia    Hypertension    Hypothyroidism    IBS (irritable bowel syndrome)    Memory changes    Obesity    Sleep apnea    HAS C -PAP / DOES NOT USE   Stress incontinence    Pt had surgery to correct this.   Tachycardia    Tobacco abuse    Tremor    UTI (lower urinary tract infection)     Past Surgical History:  Procedure Laterality Date   CORONARY IMAGING/OCT N/A 08/30/2023   Procedure: CORONARY IMAGING/OCT;  Surgeon: Orbie Pyo, MD;  [facility];  Service: Cardiovascular;  Laterality: N/A;   CORONARY STENT INTERVENTION  N/A 08/30/2023   Procedure: CORONARY STENT INTERVENTION;  Surgeon: Orbie Pyo, MD;  [facility];  Service: Cardiovascular;  Laterality: N/A;   INCONTINENCE SURGERY     LEFT HEART CATH AND CORONARY ANGIOGRAPHY N/A 07/15/2023   Procedure: LEFT HEART CATH AND CORONARY ANGIOGRAPHY;  Surgeon: Orbie Pyo, MD;  [facility];  Service: Cardiovascular;  Laterality: N/A;   LEFT HEART CATH AND CORONARY ANGIOGRAPHY N/A 08/30/2023   Procedure: LEFT HEART CATH AND CORONARY ANGIOGRAPHY;  Surgeon: Orbie Pyo, MD;  [facility]: [facility];  Service: Cardiovascular;  Laterality: N/A;   NASAL FRACTURE SURGERY     ovary removed     OVARY SURGERY     PUBOVAGINAL SLING  08/16/2011   Procedure: Leonides Grills;  Surgeon: Valetta Fuller, MD;  Location: [facility];  Service: Urology;  Laterality: N/A;          UTERINE FIBROID SURGERY  2001       Inpatient Medications: Scheduled Meds:  aspirin EC  81 mg Oral QHS   buPROPion  300 mg Oral QPM   [START ON 09/06/2023] Chlorhexidine Gluconate Cloth  6 each Topical Q0600   clopidogrel  75 mg Oral Daily   doxycycline  100 mg Oral Q12H   [START ON 09/06/2023] folic acid  1 mg Oral q AM   heparin  5,000 Units Subcutaneous Q8H   insulin pump   Subcutaneous TID WC, HS, 0200   lamoTRIgine  200 mg Oral QPM   [START ON 09/06/2023] levothyroxine  175 mcg Oral Q0600   melatonin  9 mg Oral QHS   rosuvastatin  10 mg Oral QPM   sevelamer carbonate  1,600 mg Oral TID WC   sodium chloride flush  3 mL Intravenous Q12H   Continuous Infusions:  [START ON 09/06/2023] cefTRIAXone (ROCEPHIN)  IV     PRN Meds: acetaminophen **OR** acetaminophen, albuterol, diclofenac Sodium, LORazepam, ondansetron **OR** ondansetron (ZOFRAN) IV, oxyCODONE-acetaminophen  Allergies:    Allergies  Allergen Reactions   Ciprofloxacin Swelling and Other (See Comments)    Per pt caused lips swell and nauseous feeling   Levaquin [Levofloxacin] Swelling  and Other (See Comments)    Per pt caused lips swell and nauseous feeling   Buspar [Buspirone] Other (See Comments)    abd cramping   Promethazine Other (See Comments)    Completely wipes out/fatigue   Linaclotide Other (See Comments)    Cause severe dehydration    Advair Diskus [Fluticasone-Salmeterol] Other (See Comments)    Thrush    Biaxin [Clarithromycin] Rash   Hydroxyzine Palpitations    Social History:   Social History   Socioeconomic History   Marital status: Married    Spouse name: Not on file   Number of children: 0   Years of education: 12   Highest education level: Not on file  Occupational History   Occupation: Disabled  Tobacco Use   Smoking status: Former    Current packs/day: 0.00    Average packs/day: 0.8 packs/day for 20.0 years (15.0 ttl pk-yrs)    Types: Cigarettes    Start date: 06/08/1991    Quit date: 06/08/2011    Years since quitting: 12.2   Smokeless tobacco: Never  Vaping Use   Vaping status: Never Used  Substance and Sexual Activity   Alcohol use: No   Drug use: No   Sexual activity: Yes    Birth control/protection: Post-menopausal  Other Topics Concern   Not on file  Social History Narrative   Lives at home with husband.   Right-handed.   Occasional caffeine use.   Social Drivers of Corporate investment banker Strain: Low Risk  (12/22/2022)   Received from [facility]   Overall Financial Resource Strain (CARDIA)    Difficulty of Paying Living Expenses: Not hard at all  Food Insecurity: No Food Insecurity (09/05/2023)   Hunger Vital Sign    Worried About Running Out of Food in the Last Year: Never true    Ran Out of Food in the Last Year: Never true  Transportation Needs: No Transportation Needs (09/05/2023)   PRAPARE - Administrator, Civil Service (Medical): No    Lack of Transportation (Non-Medical): No  Physical Activity: Inactive (01/23/2019)   Exercise Vital Sign    Days of Exercise per Week: 0 days    Minutes  of Exercise per Session: 0 min  Stress: Stress Concern Present (01/23/2019)   [employer] of Occupational Health - Occupational Stress Questionnaire    Feeling of Stress : Very much  Social Connections: Unknown (10/19/2021)   Received from [facility]   Social Network    Social Network: Not on file  Intimate Partner Violence: Not At Risk (  09/05/2023)   Humiliation, Afraid, Rape, and Kick questionnaire    Fear of Current or Ex-Partner: No    Emotionally Abused: No    Physically Abused: No    Sexually Abused: No    Family History:    Family History  Problem Relation Age of Onset   Asthma Mother    Bipolar disorder Mother    Heart disease Father    Lymphoma Father    Hypertension Father    Thyroid disease Father    Hyperlipidemia Father    Diabetes Father    Cancer Paternal Grandmother        lung and breast   Bladder Cancer Paternal Grandfather    Suicidality Maternal Grandfather    Thyroid disease Brother      ROS:  Please see the history of present illness.  ROS  All other ROS reviewed and negative.     Physical Exam/Data:   Vitals:   09/05/23 1407 09/05/23 1445 09/05/23 1500 09/05/23 1600  BP: (!) 110/53  (!) 136/53 (!) 140/67  Pulse: 70  71 73  Resp: 19  (!) 24 17  Temp:      TempSrc:      SpO2: 96% 100% 92% 100%  Weight:      Height:       No intake or output data in the 24 hours ending 09/05/23 1642 Filed Weights   09/05/23 1014  Weight: 120.7 kg   Body mass index is 39.28 kg/m.  General:  Well nourished, well developed, in no acute distress HEENT: normal Lymph: no adenopathy Neck: no JVD Endocrine:  No thryomegaly Vascular: No carotid bruits; FA pulses 2+ bilaterally without bruits  Cardiac:  normal S1, S2; RRR; no murmur  Lungs:  clear to auscultation bilaterally, no wheezing, rhonchi or rales  Abd: soft, nontender, no hepatomegaly  Ext: no edema Musculoskeletal:  No deformities, BUE and BLE strength normal and  equal Skin: warm and dry  Neuro:  CNs 2-12 intact, no focal abnormalities noted Psych:  Normal affect   EKG:  The EKG was personally reviewed and demonstrates:   Telemetry:  Telemetry was personally reviewed and demonstrates:    Relevant CV Studies:   Laboratory Data:  Chemistry Recent Labs  Lab 08/30/23 1050 09/05/23 1057  NA 110* 133*  K 4.2 3.8  CL  --  97*  CO2  --  23  GLUCOSE  --  155*  BUN  --  34*  CREATININE  --  5.55*  CALCIUM  --  9.3  GFRNONAA  --  9*  ANIONGAP  --  13    Recent Labs  Lab 09/05/23 1057  PROT 6.4*  ALBUMIN 2.9*  AST 73*  ALT 38  ALKPHOS 118  BILITOT 0.6   Hematology Recent Labs  Lab 08/30/23 1050 09/05/23 1057  WBC  --  14.4*  RBC  --  3.57*  HGB 12.6 10.8*  HCT 37.0 34.0*  MCV  --  95.2  MCH  --  30.3  MCHC  --  31.8  RDW  --  13.2  PLT  --  203   Cardiac EnzymesNo results for input(s): "TROPONINI" in the last 168 hours. No results for input(s): "TROPIPOC" in the last 168 hours.  BNPNo results for input(s): "BNP", "PROBNP" in the last 168 hours.  DDimer No results for input(s): "DDIMER" in the last 168 hours.  Radiology/Studies:  DG Chest 2 View Result Date: 09/05/2023 CLINICAL DATA:  Cough, hypoxia EXAM: CHEST - 2 VIEW  COMPARISON:  Radiograph 07/09/2023 FINDINGS: Large-bore central venous line unchanged. Stable cardiac silhouette. There is interval increase in perihilar airspace densities. Low lung volumes. No pneumothorax. IMPRESSION: New perihilar airspace densities suggest pulmonary edema. Electronically Signed   By: Genevive Bi M.D.   On: 09/05/2023 12:24    Assessment and Plan:    Myocardial injury secondary to demand ischemia from thigh abscess:  Hs troponins elevated, 273>> 626.  She never complained of any chest pain.  Unclear why troponins were checked.  Please stop checking troponins.  EKG showed NSR, no new  ischemia (has nonspecific ST-T wave changes at baseline).  No further cardiac workup is indicated  at this time. I&D done today.  Multivessel CAD s/p LAD PCI, diagonal PCI and RCA PCI on 08/30/2023: No interval angina.  Did not miss any doses of DAPT.  Continue DAPT, aspirin 81 mg once daily and Plavix 75 mg once daily.  Continue high intensity statin, rosuvastatin 10 mg nightly.  Echo from February 2025 showed normal LVEF.  HLD, at goal: Continue rosuvastatin 10 mg nightly.  DM 1, OSA-management per primary team.  Thigh abscess-I&D performed today.  Management per primary team.   [facility] will sign off.   Medication Recommendations: Continue home medications Other recommendations (labs, testing, etc): None Follow up as an outpatient: Already has cardiology appointments in place.    For questions or updates, please contact [facility] Please consult [email or web address] for contact info under Cardiology/STEMI.   Signed, Herbert Deaner, MD 09/05/2023 4:42 PM

## 2023-09-05 NOTE — ED Provider Notes (Signed)
 [place] EMERGENCY DEPARTMENT AT [facility] Provider Note   CSN: [id number] Arrival date & time: 09/05/23  1003     History  Chief Complaint  Patient presents with   Weakness    Jasmine [name] is a 53 y.o. female.  HPI 53 year old female presents with generalized weakness.  Patient has a history significant for type 1 diabetes, ESRD on dialysis, CHF and CAD.  She started feeling bad yesterday with all over lethargy and weakness.  Was found to have a fever when picked up with EMS.  No meds have been given today.  She has had a cough for couple days but denies chest pain, shortness of breath, focal weakness or abdominal pain or vomiting.  She denies any pain around her dialysis access site in her chest.  Less than a week ago she had 3 stents placed. She is hypoxic on room air.  Home Medications Prior to Admission medications   Medication Sig Start Date End Date Taking? Authorizing Provider  acetaminophen (TYLENOL) 500 MG tablet Take 500-1,000 mg by mouth every 6 (six) hours as needed (pain.).    [provider]  albuterol (VENTOLIN HFA) 108 (90 Base) MCG/ACT inhaler Inhale 1-2 puffs into the lungs every 6 (six) hours as needed for wheezing or shortness of breath. 04/07/23   Oretha Milch, MD  Ascorbic Acid (VITAMIN C PO) Take 1 tablet by mouth in the morning.    [provider]  aspirin EC 81 MG tablet Take 81 mg by mouth at bedtime. Swallow whole.    [provider]  azelastine (ASTELIN) 0.1 % nasal spray Place 2 sprays into both nostrils 2 (two) times daily. Use in each nostril as directed Patient taking differently: Place 2 sprays into both nostrils in the morning. Use in each nostril as directed 08/01/23   Glenford Bayley, NP  buPROPion (WELLBUTRIN XL) 300 MG 24 hr tablet Take 1 tablet (300 mg total) by mouth daily. Patient taking differently: Take 300 mg by mouth every evening. 03/23/23 09/19/23  Neysa Hotter, MD  carvedilol (COREG) 12.5  MG tablet Take 12.5 mg by mouth 2 (two) times daily with a meal. 01/17/23 01/17/24  [provider]  Cholecalciferol (VITAMIN D-3 PO) Take 1 tablet by mouth in the morning.    [provider]  clopidogrel (PLAVIX) 75 MG tablet Take 1 tablet (75 mg total) by mouth daily. 02/22/23   Alver Sorrow, NP  Continuous Blood Gluc Sensor MISC 1 each by Does not apply route as directed. Use as directed every 14 days. May dispense FreeStyle [employer] or similar.    [provider]  diclofenac Sodium (VOLTAREN) 1 % GEL Apply 2 g topically 4 (four) times daily as needed (pain.).    [provider]  dicyclomine (BENTYL) 10 MG capsule Take 10 mg by mouth 2 (two) times daily as needed for spasms. 12/25/22   [provider]  DULoxetine (CYMBALTA) 30 MG capsule Take 1 capsule (30 mg total) by mouth daily. 07/03/23 10/31/23  Neysa Hotter, MD  DULoxetine (CYMBALTA) 60 MG capsule Take 60 mg by mouth in the morning.    [provider]  folic acid (FOLVITE) 800 MCG tablet Take 800 mcg by mouth in the morning.    [provider]  glucagon (GLUCAGEN HYPOKIT) 1 MG SOLR injection Inject 1 mg into the skin once as needed for up to 1 dose for low blood sugar. GlucaGen HypoKit 1 mg Injection 03/31/23  Johnson, Clanford L, MD  hydrALAZINE (APRESOLINE) 25 MG tablet Take 25 mg by mouth 2 (two) times daily. 05/03/23   [provider]  insulin detemir (LEVEMIR) 100 UNIT/ML injection Inject 27 Units into the skin 2 (two) times daily as needed (if insulin pump inoperable). 04/04/23   [provider]  lamoTRIgine (LAMICTAL) 200 MG tablet Take 1 tablet (200 mg total) by mouth daily. Patient taking differently: Take 200 mg by mouth every evening. 04/24/23 10/21/23  Neysa Hotter, MD  levothyroxine (SYNTHROID) 175 MCG tablet Take 1.5 tablets (mcg) by mouth on Sundays in the mornings & take 1 tablet (175 mcg) by mouth on all other days. 11/20/21   [provider]  LORazepam (ATIVAN) 0.5 MG tablet Take 1 tablet (0.5 mg total) by mouth daily as needed for anxiety. Patient taking differently: Take 0.5 mg by mouth daily as needed for anxiety or sleep. 07/04/23 10/02/23  Neysa Hotter, MD  Melatonin 10 MG TABS Take 10 mg by mouth at bedtime.    [provider]  NOVOLOG 100 UNIT/ML injection Inject into the skin continuous. Sliding Scale Insulin pump 2.5 units basal Bolus with meal depending on the size 03/15/22   [provider]  ondansetron (ZOFRAN-ODT) 4 MG disintegrating tablet Dissolve 1 tablet (4 mg total) by mouth every 8 (eight) hours as needed. 03/17/22   [provider]  OVER THE COUNTER MEDICATION Apply 1 application  topically at bedtime as needed (Sleep). CBD oil    [provider]  oxyCODONE-acetaminophen (PERCOCET/ROXICET) 5-325 MG tablet Take 1 tablet by mouth daily as needed (pain.).    [provider]  OXYGEN Inhale 3 L into the lungs at bedtime.    [provider]  rosuvastatin (CRESTOR) 10 MG tablet Take 1 tablet (10 mg total) by mouth daily. Patient taking differently: Take 10 mg by mouth every evening. 07/29/23 10/27/23  Jodelle Red, MD  sevelamer carbonate (RENVELA) 800 MG tablet Take 1,600 mg by mouth 3 (three) times daily with meals. 07/28/23   [provider]  torsemide (DEMADEX) 20 MG tablet TAKE TWO TABLETS BY MOUTH AS DIRECTED ON non dialysis DAYS    [provider]      Allergies    Ciprofloxacin, Levaquin [levofloxacin], Buspar [buspirone], Promethazine, Linaclotide, Advair diskus [fluticasone-salmeterol], Biaxin [clarithromycin], and Hydroxyzine    Review of Systems   Review of Systems  Constitutional:  Positive for fatigue and fever.  HENT:  Negative for sore throat.   Respiratory:  Positive for cough. Negative for shortness of breath.   Cardiovascular:  Negative for chest pain.  Gastrointestinal:  Negative for abdominal pain and  vomiting.  Neurological:  Negative for headaches.    Physical Exam Updated Vital Signs BP (!) 110/53   Pulse 70   Temp 98.2 F (36.8 C) (Oral)   Resp 19   Ht 5\' 9"  (1.753 m)   Wt 120.7 kg   LMP 03/23/2017 (Approximate)   SpO2 96%   BMI 39.28 kg/m  Physical Exam Vitals and nursing note reviewed.  Constitutional:      General: She is not in acute distress.    Appearance: She is well-developed. She is obese. She is not ill-appearing or diaphoretic.  HENT:     Head: Normocephalic and atraumatic.  Cardiovascular:     Rate and Rhythm: Normal rate and regular rhythm.     Heart sounds: Normal heart sounds.  Pulmonary:     Effort: Pulmonary effort is normal.     Breath sounds:  Rales (at bases) present.  Abdominal:     General: There is no distension.     Palpations: Abdomen is soft.     Tenderness: There is no abdominal tenderness.  Musculoskeletal:     Comments: Superficial abscess with mild cellulitis in the right medial and middle thigh.  Around 4 cm in length.  Skin:    General: Skin is warm and dry.  Neurological:     Mental Status: She is alert.     Comments: CN 3-12 grossly intact. 5/5 strength in all 4 extremities. Grossly normal sensation.     ED Results / Procedures / Treatments   Labs (all labs ordered are listed, but only abnormal results are displayed) Labs Reviewed  COMPREHENSIVE METABOLIC PANEL WITH GFR - Abnormal; Notable for the following components:      Result Value   Sodium 133 (*)    Chloride 97 (*)    Glucose, Bld 155 (*)    BUN 34 (*)    Creatinine, Ser 5.55 (*)    Total Protein 6.4 (*)    Albumin 2.9 (*)    AST 73 (*)    GFR, Estimated 9 (*)    All other components within normal limits  CBC WITH DIFFERENTIAL/PLATELET - Abnormal; Notable for the following components:   WBC 14.4 (*)    RBC 3.57 (*)    Hemoglobin 10.8 (*)    HCT 34.0 (*)    Neutro Abs 13.1 (*)    Lymphs Abs 0.3 (*)    Abs Immature Granulocytes 0.10 (*)    All other  components within normal limits  APTT - Abnormal; Notable for the following components:   aPTT 22 (*)    All other components within normal limits  CBG MONITORING, ED - Abnormal; Notable for the following components:   Glucose-Capillary 162 (*)    All other components within normal limits  TROPONIN I (HIGH SENSITIVITY) - Abnormal; Notable for the following components:   Troponin I (High Sensitivity) 273 (*)    All other components within normal limits  TROPONIN I (HIGH SENSITIVITY) - Abnormal; Notable for the following components:   Troponin I (High Sensitivity) 626 (*)    All other components within normal limits  RESP PANEL BY RT-PCR (RSV, FLU A&B, COVID)  RVPGX2  CULTURE, BLOOD (ROUTINE X 2)  CULTURE, BLOOD (ROUTINE X 2)  AEROBIC CULTURE W GRAM STAIN (SUPERFICIAL SPECIMEN)  MRSA NEXT GEN BY PCR, NASAL  EXPECTORATED SPUTUM ASSESSMENT W GRAM STAIN, RFLX TO RESP C  LACTIC ACID, PLASMA  PROTIME-INR  URINALYSIS, W/ REFLEX TO CULTURE (INFECTION SUSPECTED)  PROCALCITONIN  LEGIONELLA PNEUMOPHILA SEROGP 1 UR AG  STREP PNEUMONIAE URINARY ANTIGEN    EKG EKG Interpretation Date/Time:  Monday September 05 2023 10:28:35 EDT Ventricular Rate:  95 PR Interval:  151 QRS Duration:  109 QT Interval:  359 QTC Calculation: 452 R Axis:   4  Text Interpretation: Sinus rhythm Low voltage, precordial leads Probable anteroseptal infarct, old rate is faster, otherwise no significant change since Mar 2025 Confirmed by Pricilla Loveless [phone]) on 09/05/2023 10:42:04 AM  Radiology DG Chest 2 View Result Date: 09/05/2023 CLINICAL DATA:  Cough, hypoxia EXAM: CHEST - 2 VIEW COMPARISON:  Radiograph 07/09/2023 FINDINGS: Large-bore central venous line unchanged. Stable cardiac silhouette. There is interval increase in perihilar airspace densities. Low lung volumes. No pneumothorax. IMPRESSION: New perihilar airspace densities suggest pulmonary edema. Electronically Signed   By: Genevive Bi M.D.   On:  09/05/2023 12:24  Procedures .Incision and Drainage  Date/Time: 09/05/2023 1:56 PM  Performed by: Pricilla Loveless, MD Authorized by: Pricilla Loveless, MD   Consent:    Consent obtained:  Verbal   Consent given by:  Patient   Risks, benefits, and alternatives were discussed: yes   Location:    Type:  Abscess   Size:  4 cm   Location:  Lower extremity   Lower extremity location:  Leg   Leg location:  R upper leg Pre-procedure details:    Skin preparation:  Povidone-iodine Sedation:    Sedation type:  None Anesthesia:    Anesthesia method:  Local infiltration   Local anesthetic:  Lidocaine 2% WITH epi Procedure type:    Complexity:  Simple Procedure details:    Incision types:  Elliptical   Incision depth:  Dermal   Wound management:  Probed and deloculated   Drainage:  Purulent   Drainage amount:  Moderate   Wound treatment:  Wound left open   Packing materials:  None Post-procedure details:    Procedure completion:  Tolerated well, no immediate complications .Critical Care  Performed by: Pricilla Loveless, MD Authorized by: Pricilla Loveless, MD   Critical care provider statement:    Critical care time (minutes):  30   Critical care time was exclusive of:  Separately billable procedures and treating other patients   Critical care was necessary to treat or prevent imminent or life-threatening deterioration of the following conditions:  Respiratory failure and sepsis   Critical care was time spent personally by me on the following activities:  Development of treatment plan with patient or surrogate, discussions with consultants, evaluation of patient's response to treatment, examination of patient, ordering and review of laboratory studies, ordering and review of radiographic studies, ordering and performing treatments and interventions, pulse oximetry, re-evaluation of patient's condition and review of old charts     Medications Ordered in ED Medications  doxycycline  (VIBRAMYCIN) 100 mg in sodium chloride 0.9 % 250 mL IVPB (100 mg Intravenous New Bag/Given 09/05/23 1336)  Chlorhexidine Gluconate Cloth 2 % PADS 6 each (has no administration in time range)  aspirin EC tablet 81 mg (has no administration in time range)  oxyCODONE-acetaminophen (PERCOCET/ROXICET) 5-325 MG per tablet 1 tablet (has no administration in time range)  rosuvastatin (CRESTOR) tablet 10 mg (has no administration in time range)  buPROPion (WELLBUTRIN XL) 24 hr tablet 300 mg (has no administration in time range)  LORazepam (ATIVAN) tablet 0.5 mg (has no administration in time range)  levothyroxine (SYNTHROID) tablet 175 mcg (has no administration in time range)  sevelamer carbonate (RENVELA) tablet 1,600 mg (has no administration in time range)  clopidogrel (PLAVIX) tablet 75 mg (has no administration in time range)  Melatonin TABS 10 mg (has no administration in time range)  folic acid (FOLVITE) tablet 800 mcg (has no administration in time range)  lamoTRIgine (LAMICTAL) tablet 200 mg (has no administration in time range)  albuterol (VENTOLIN HFA) 108 (90 Base) MCG/ACT inhaler 1-2 puff (has no administration in time range)  diclofenac Sodium (VOLTAREN) 1 % topical gel 2 g (has no administration in time range)  insulin pump (has no administration in time range)  cefTRIAXone (ROCEPHIN) 1 g in sodium chloride 0.9 % 100 mL IVPB (has no administration in time range)  doxycycline (VIBRA-TABS) tablet 100 mg (has no administration in time range)  heparin injection 5,000 Units (has no administration in time range)  sodium chloride flush (NS) 0.9 % injection 3 mL (has no administration in time  range)  acetaminophen (TYLENOL) tablet 650 mg (has no administration in time range)    Or  acetaminophen (TYLENOL) suppository 650 mg (has no administration in time range)  ondansetron (ZOFRAN) tablet 4 mg (has no administration in time range)    Or  ondansetron (ZOFRAN) injection 4 mg (has no  administration in time range)  acetaminophen (TYLENOL) tablet 1,000 mg (1,000 mg Oral Given 09/05/23 1031)  cefTRIAXone (ROCEPHIN) 1 g in sodium chloride 0.9 % 100 mL IVPB (0 g Intravenous Stopped 09/05/23 1338)  lidocaine-EPINEPHrine (XYLOCAINE W/EPI) 2 %-1:200000 (PF) injection 20 mL (20 mLs Intradermal Given by Other 09/05/23 1343)    ED Course/ Medical Decision Making/ A&P                                 Medical Decision Making Amount and/or Complexity of Data Reviewed Labs: ordered.    Details: Leukocytosis Radiology: ordered and independent interpretation performed.    Details: Pneumonia ECG/medicine tests: ordered and independent interpretation performed.    Details: No ischemia  Risk OTC drugs. Prescription drug management. Decision regarding hospitalization.   Patient was treated with IV antibiotics for presumed pneumonia.  She did transiently drop her blood pressures but then this came up.  She was a little more lethargic on arrival but I think this was all the fever.  She is currently requiring oxygen and so she will certainly need admission.  Chart review does show she also had a right thigh abscess that was drained at urgent care a few days ago but when talking to patient she notes there was not much change and it does appear that she has an abscess on clinical exam.  There was some purulent drainage after incising as above.  I do not think this is the source of her fever or likely to cause hypoxia however and I think she will still need admission.  Discussed with Dr. Jarvis Newcomer.        Final Clinical Impression(s) / ED Diagnoses Final diagnoses:  Acute respiratory failure with hypoxia (HCC)  Community acquired pneumonia, unspecified laterality  Abscess of right thigh    Rx / DC Orders ED Discharge Orders     None         Pricilla Loveless, MD 09/05/23 1451

## 2023-09-05 NOTE — Plan of Care (Signed)
  Problem: Education: Goal: Knowledge of General Education information will improve Description: Including pain rating scale, medication(s)/side effects and non-pharmacologic comfort measures Outcome: Progressing   Problem: Clinical Measurements: Goal: Ability to maintain clinical measurements within normal limits will improve Outcome: Progressing   Problem: Clinical Measurements: Goal: Diagnostic test results will improve Outcome: Progressing   

## 2023-09-05 NOTE — ED Triage Notes (Signed)
 Pt arrived by [facility] for weakness and lethargy that started lastnight. Ems states pt has had fever of 101.3 at home. Pt is a dialysis pt and has not misses any treatments. Pt did have 3 cardiac stents placed last week.

## 2023-09-06 DIAGNOSIS — R6520 Severe sepsis without septic shock: Secondary | ICD-10-CM | POA: Diagnosis not present

## 2023-09-06 DIAGNOSIS — B9562 Methicillin resistant Staphylococcus aureus infection as the cause of diseases classified elsewhere: Secondary | ICD-10-CM | POA: Diagnosis not present

## 2023-09-06 DIAGNOSIS — J9601 Acute respiratory failure with hypoxia: Secondary | ICD-10-CM | POA: Diagnosis not present

## 2023-09-06 DIAGNOSIS — A419 Sepsis, unspecified organism: Secondary | ICD-10-CM | POA: Diagnosis not present

## 2023-09-06 DIAGNOSIS — A4102 Sepsis due to Methicillin resistant Staphylococcus aureus: Secondary | ICD-10-CM | POA: Diagnosis not present

## 2023-09-06 LAB — CBC
HCT: 30.6 % — ABNORMAL LOW (ref 36.0–46.0)
Hemoglobin: 9.4 g/dL — ABNORMAL LOW (ref 12.0–15.0)
MCH: 29.8 pg (ref 26.0–34.0)
MCHC: 30.7 g/dL (ref 30.0–36.0)
MCV: 97.1 fL (ref 80.0–100.0)
Platelets: 165 10*3/uL (ref 150–400)
RBC: 3.15 MIL/uL — ABNORMAL LOW (ref 3.87–5.11)
RDW: 13.4 % (ref 11.5–15.5)
WBC: 17.9 10*3/uL — ABNORMAL HIGH (ref 4.0–10.5)
nRBC: 0 % (ref 0.0–0.2)

## 2023-09-06 LAB — BLOOD CULTURE ID PANEL (REFLEXED) - BCID2

## 2023-09-06 LAB — GLUCOSE, CAPILLARY
Glucose-Capillary: 366 mg/dL — ABNORMAL HIGH (ref 70–99)
Glucose-Capillary: 225 mg/dL — ABNORMAL HIGH (ref 70–99)
Glucose-Capillary: 93 mg/dL (ref 70–99)
Glucose-Capillary: 152 mg/dL — ABNORMAL HIGH (ref 70–99)
Glucose-Capillary: 237 mg/dL — ABNORMAL HIGH (ref 70–99)
Glucose-Capillary: 159 mg/dL — ABNORMAL HIGH (ref 70–99)
Glucose-Capillary: 146 mg/dL — ABNORMAL HIGH (ref 70–99)

## 2023-09-06 LAB — BASIC METABOLIC PANEL WITH GFR
Anion gap: 10 (ref 5–15)
BUN: 48 mg/dL — ABNORMAL HIGH (ref 6–20)
CO2: 25 mmol/L (ref 22–32)
Calcium: 9.3 mg/dL (ref 8.9–10.3)
Chloride: 96 mmol/L — ABNORMAL LOW (ref 98–111)
Creatinine, Ser: 6.35 mg/dL — ABNORMAL HIGH (ref 0.44–1.00)
GFR, Estimated: 7 mL/min — ABNORMAL LOW (ref >60)
Glucose, Bld: 114 mg/dL — ABNORMAL HIGH (ref 70–99)
Potassium: 4.1 mmol/L (ref 3.5–5.1)
Sodium: 131 mmol/L — ABNORMAL LOW (ref 135–145)

## 2023-09-06 LAB — HEPATITIS B SURFACE ANTIGEN: Hepatitis B Surface Ag: NONREACTIVE

## 2023-09-06 MED ORDER — VANCOMYCIN HCL 2000 MG/400ML IV SOLN
2000.0000 mg | Freq: Once | INTRAVENOUS | Status: AC
  Administered 2023-09-06: 2000 mg via INTRAVENOUS
  Filled 2023-09-06: qty 400

## 2023-09-06 MED ORDER — VANCOMYCIN HCL IN DEXTROSE 1-5 GM/200ML-% IV SOLN
1000.0000 mg | INTRAVENOUS | Status: DC
  Administered 2023-09-06: 1000 mg via INTRAVENOUS
  Filled 2023-09-06: qty 200

## 2023-09-06 MED ORDER — INSULIN ASPART 100 UNIT/ML IJ SOLN
0.0000 [IU] | INTRAMUSCULAR | Status: DC
  Administered 2023-09-06: 2 [IU] via SUBCUTANEOUS
  Administered 2023-09-06 – 2023-09-07 (×4): 1 [IU] via SUBCUTANEOUS
  Administered 2023-09-08: 9 [IU] via SUBCUTANEOUS
  Administered 2023-09-08 (×3): 2 [IU] via SUBCUTANEOUS
  Administered 2023-09-09: 5 [IU] via SUBCUTANEOUS
  Administered 2023-09-09: 3 [IU] via SUBCUTANEOUS
  Administered 2023-09-09: 1 [IU] via SUBCUTANEOUS
  Administered 2023-09-09: 3 [IU] via SUBCUTANEOUS
  Administered 2023-09-09: 5 [IU] via SUBCUTANEOUS
  Administered 2023-09-10: 9 [IU] via SUBCUTANEOUS
  Administered 2023-09-10: 3 [IU] via SUBCUTANEOUS
  Administered 2023-09-10: 5 [IU] via SUBCUTANEOUS
  Administered 2023-09-10: 3 [IU] via SUBCUTANEOUS
  Administered 2023-09-10: 7 [IU] via SUBCUTANEOUS
  Administered 2023-09-11: 0 [IU] via SUBCUTANEOUS
  Administered 2023-09-11: 3 [IU] via SUBCUTANEOUS
  Administered 2023-09-11: 7 [IU] via SUBCUTANEOUS

## 2023-09-06 MED ORDER — INSULIN ASPART 100 UNIT/ML IJ SOLN
3.0000 [IU] | Freq: Three times a day (TID) | INTRAMUSCULAR | Status: DC
  Administered 2023-09-06 – 2023-09-07 (×3): 3 [IU] via SUBCUTANEOUS

## 2023-09-06 MED ORDER — CHLORHEXIDINE GLUCONATE CLOTH 2 % EX PADS
6.0000 | MEDICATED_PAD | Freq: Every day | CUTANEOUS | Status: DC
Start: 2023-09-06 — End: 2023-09-11
  Administered 2023-09-06 – 2023-09-11 (×5): 6 via TOPICAL

## 2023-09-06 MED ORDER — INSULIN ASPART 100 UNIT/ML IJ SOLN
15.0000 [IU] | Freq: Once | INTRAMUSCULAR | Status: AC
  Administered 2023-09-06: 15 [IU] via SUBCUTANEOUS

## 2023-09-06 MED ORDER — INSULIN GLARGINE-YFGN 100 UNIT/ML ~~LOC~~ SOLN
22.0000 [IU] | Freq: Two times a day (BID) | SUBCUTANEOUS | Status: DC
  Administered 2023-09-06 – 2023-09-07 (×4): 22 [IU] via SUBCUTANEOUS
  Filled 2023-09-06 (×7): qty 0.22

## 2023-09-06 MED ORDER — VANCOMYCIN VARIABLE DOSE PER UNSTABLE RENAL FUNCTION (PHARMACIST DOSING): Status: DC

## 2023-09-06 MED ORDER — ALUM & MAG HYDROXIDE-SIMETH 200-200-20 MG/5ML PO SUSP
15.0000 mL | Freq: Two times a day (BID) | ORAL | Status: DC | PRN
  Administered 2023-09-06 – 2023-09-09 (×2): 15 mL via ORAL
  Filled 2023-09-06 (×2): qty 30

## 2023-09-06 NOTE — Progress Notes (Signed)
  IR BRIEF NOTE:  IR was requested for tunneled HD cath removal for line holiday after HD today. Unfortunately, there is no APP available at [facility] today. Patient currently undergoing HD. Will defer TDC line removal until tomorrow, when APP is available.  Electronically Signed: Sable Feil, PA-C 09/06/2023, 2:49 PM

## 2023-09-06 NOTE — Progress Notes (Signed)
 Date and time results received: 09/06/23 0112   Test: Blood cultures for Aerobic and Anaerobic bottles Critical Value: Gram Positive Cocci  Name of Provider Notified: Mansy  Orders Received? Or Actions Taken?: Awaiting further orders.  [place] RN

## 2023-09-06 NOTE — Progress Notes (Signed)
 Pt completed tx without issue.Pt goal met . Vancomycin 1 g IV given at last 1 hour.  09/06/23 1800  Vitals  Temp 99.5 F (37.5 C)  Temp Source Oral  BP (!) 141/56  MAP (mmHg) 75  BP Location Left Arm  BP Method Automatic  Patient Position (if appropriate) Lying  Pulse Rate 79  ECG Heart Rate 77  Resp 20  Oxygen Therapy  SpO2 100 %  O2 Device Nasal Cannula  O2 Flow Rate (L/min) 2 L/min  During Treatment Monitoring  Blood Flow Rate (mL/min) 399 mL/min  Arterial Pressure (mmHg) -213.12 mmHg  Venous Pressure (mmHg) 178.98 mmHg  TMP (mmHg) 5.86 mmHg  Ultrafiltration Rate (mL/min) 829 mL/min  Dialysate Flow Rate (mL/min) 299 ml/min  Duration of HD Treatment -hour(s) 3.44 hour(s)  Cumulative Fluid Removed (mL) per Treatment  1952.37  HD Safety Checks Performed Yes  Intra-Hemodialysis Comments Tx completed  Post Treatment  Dialyzer Clearance Lightly streaked  Hemodialysis Intake (mL) 0 mL  Liters Processed 82  Fluid Removed (mL) 2000 mL  Tolerated HD Treatment Yes  Post-Hemodialysis Comments Pt goal met.

## 2023-09-06 NOTE — Progress Notes (Signed)
 [facility] PROGRESS NOTE  Jasmine [name] (DOB: 1970/11/22) NWG:[id number] PCP: Richmond Campbell., PA-C  Brief Narrative: Jasmine [name] is a 53 y.o. female with a history of T1DM on insulin pump, CAD s/p DES x4 on 08/30/2023, ESRD (on HD TTS thru right IJ TDC), OSA on 3L nocturnal O2, obesity, HTN, HLD, bipolar disorder, IBS and recent I&D of right thigh abscess (3/28) who presented by EMS from home with feeling ill, weak, fatigued starting the night before associated with fever of 101.67F and shaking chills.  In the ED she was febrile with leukocytosis, tachypneic to 24/min, placed on supplemental oxygen (not on this at baseline). HR and BP were stable. Lactic acid normal at 1.2. Initial troponin 273, rising subsequently though had no dynamic ECG changes nor chest pain, felt per cardiology to be demand myocardial ischemia. CXR demonstrated a right hilar opacity.    IV antibiotics were started, blood cultures drawn, and the patient was admitted to SDU. Blood cultures have grown MRSA.   Subjective: Feels ok, still generally lousy, but no specific complaints. No chest pain or dyspnea specifically. T max 100.8F. Right thigh wound not bothering her.   Objective: BP (!) 108/42   Pulse 77   Temp 98.3 F (36.8 C) (Oral)   Resp 13   Ht 5\' 9"  (1.753 m)   Wt 120.7 kg   LMP 03/23/2017 (Approximate)   SpO2 96%   BMI 39.28 kg/m   Gen: Tired-appearing female in no acute distress Pulm: Clear, nonlabored  CV: Regular, NSR, no MRG.  GI: Soft, NT, ND, +BS  Neuro: Alert and oriented. No new focal deficits. Ext: Warm, no deformities. Skin: Right chest tunneled catheter site is c/d/I without exudate, erythema, tenderness. Right thigh wound not reexamined this morning.   Assessment & Plan: Sepsis due to MRSA bacteremia:   - Changed doxycycline, ceftriaxone to vancomycin. Will continue this dosed per pharmacy.  - TTE ordered, would need TEE if negative.  - Pt has dialysis catheter. Will  consult IR for removal, ideally right after dialysis to allow for maximal line holiday.  - Repeat blood cultures - ID consulted, Discussed briefly with Dr. Renold Don.  - Remain in SDU for now - Tylenol prn   CAD s/p DES x4 08/30/2023 with troponin elevation, HTN, HLD:  - Continue DAPT - Continue statin - Hold BB, hydralazine given softening BP.   - Note history of migraine, pt specifically says she hasn't taken maxalt. This should be discontinued in light of her ischemic coronary artery disease.  - Cardiology consulted, recommending no further work up or treatment, pt remains without anginal symptoms. Has a scheduled follow up with Jari Favre, PA 4/9 at 10:55am   Acute on chronic hypoxic respiratory failure (nocturnal O2 chronically):  - Continue supplemental oxygen to maintain normal WOB and SpO2 >92%.  - Abx as above.   ESRD: Had normal HD Saturday on schedule, no urgent needs.  - Nephrology notified of admission, will need routine HD TTS while admitted. Mention of opacity possibly being pulmonary edema, though does not appear overloaded, will monitor.  - Hold non-HD days torsemide with sepsis as above   Right thigh abscess: Culture grew S. aureus - Repeat I&D in ED 3/31, continue basic local wound care and abx as above.   T1DM: Last HbA1c < 6%.  - Insulin pump ran out of insulin, so started on bolus sliding scale. Will add basal insulin based on pump rate, give mealtime novolog coverage and check CBGs/give SSI q4h.  Bipolar disorder: Quiescent.  - Continue home medications including lamictal, wellbutrin, cymbalta, prn ativan   Hypothyroidism:  - Continue synthroid at dosing, typically takes 1.5x dose on Sundays.   Tyrone Nine, MD [facility] [email or web address] 09/06/2023, 12:47 PM

## 2023-09-06 NOTE — Plan of Care (Signed)
   Problem: Education: Goal: Knowledge of General Education information will improve Description Including pain rating scale, medication(s)/side effects and non-pharmacologic comfort measures Outcome: Progressing

## 2023-09-06 NOTE — Progress Notes (Signed)
 Pharmacy Antibiotic Note  Jasmine [name] is a 53 y.o. female admitted on 09/05/2023 with weakness and found to have gram positive cocci in 4/4 blood cultures. Patient has had recent I&D of right thigh abscess growing gram positive cocci in pairs. Pharmacy has been consulted for vancomycin dosing.  -PMH: T1DM, CAD s/p DES x4 (08/2023), ESRD (HD TTS) -WBC 14, Tmax at arrival 102.2  -Has received ceftriaxone/doxy  Plan: -Vancomycin 2g IV x1 -Vancomycin 1g IV after every HD session -Follow up HD schedule inpatient -Recommend d/c'ing ceftriaxone/doxycyline -Follow up signs of clinical improvement, LOT, de-escalation of antibiotics   Height: 5\' 9"  (175.3 cm) Weight: 120.7 kg (266 lb) IBW/kg (Calculated) : 66.2  Temp (24hrs), Avg:99.8 F (37.7 C), Min:98.2 F (36.8 C), Max:102.2 F (39 C)  Recent Labs  Lab 09/05/23 1057  WBC 14.4*  CREATININE 5.55*  LATICACIDVEN 1.2    Estimated Creatinine Clearance: 16.7 mL/min (A) (by C-G formula based on SCr of 5.55 mg/dL (H)).    Allergies  Allergen Reactions   Ciprofloxacin Swelling and Other (See Comments)    Per pt caused lips swell and nauseous feeling   Levaquin [Levofloxacin] Swelling and Other (See Comments)    Per pt caused lips swell and nauseous feeling   Linaclotide Other (See Comments)    Cause severe dehydration   Promethazine Other (See Comments)    Completely wipes out/fatigue   Buspar [Buspirone] Other (See Comments)    abd cramping   Advair Diskus [Fluticasone-Salmeterol] Other (See Comments)    Thrush    Biaxin [Clarithromycin] Rash   Hydroxyzine Palpitations    Antimicrobials this admission: Vancomycin 4/1 >>  Microbiology results: 3/31 BCx: GPC 3/31 Wound Cx: GPC in pairs 3/31 MRSA PCR: +  Thank you for allowing pharmacy to be a part of this patient's care.  Arabella Merles, PharmD. Clinical Pharmacist 09/06/2023 1:38 AM

## 2023-09-06 NOTE — Consult Note (Addendum)
 [facility]    Date of Admission:  09/05/2023     Reason for Consult: mrsa bsi    Referring Provider: Claudia Pollock     Lines:  03/2023-c right internal jugular hd catheter  Abx: 4/1-c vanc  3/31 ceftriaxone/doxy        Assessment: Mrsa bsi Right thigh abscess  troponinemia Esrd on iHD via hd catheter   3/31 bcx mrsa 3/31 bedside I&D right thigh abscess cx abundant staph aureus 4/01 bcx in progress  Source maybe thigh or occult bacteremia from dialysis that seeded the right thigh. High risk endocarditis/concern for line infection and will need hd catheter removal and tte/tee  No prosthetic joints or indwelling device otherwise  Troponin up but EKG unremarkable, recent DES 08/30/23. Less likely endocardial involvement but something to think about with staph aureus bsi  Plan: Please discuss with renal to remove hd line and give 3 day line holiday Please send catheter tip for culture and also draw repeat blood culture the same day, after hd line removal Continue vancomycin Please get tte and TEE  Please evaluate with imaging any midline back pain or peripheral joint pain Contact isolation precaution Discussed with primary team     ------------------------------------------------ Principal Problem:   Sepsis (HCC) Active Problems:   Acquired hypothyroidism   Essential hypertension   Bipolar II disorder (HCC)   Chronic migraine w/o aura w/o status migrainosus, not intractable   GERD (gastroesophageal reflux disease)   Other hyperlipidemia   Elevated troponin   Asthma   CAD (coronary artery disease)   End-stage renal disease on hemodialysis (HCC)   DKA, type 1 (HCC)   Myocardial injury   Abscess of right thigh    HPI: Jasmine [name] is a 53 y.o. female with dm1, esrd on iHD TTS via hd catheter, cad s/p des 08/30/23, htn, hlp, obesity, osa, bipolqar, hypothyroidism, recent right thigh abscess s/p ED drainage  on 3/28, admitted 3/31 with malaise and sepsis found to have mrsa bsi  Chart reviewed Patient had right thigh abscess and recent I&D 3/28 She was BIBA from home for fever, shaking chill, malaise/generalized weakness along with new dry cough No chest pain   On presentation  Tachypneic and ?hypoxemic requiring supplemental o2 Wbc 14 Trop 273 EKG no st changes or heart block Cxr perihilar opacity  Admission abx quickly returned with mrsa   Exam in ed noted to have right thigh cellulitis and residual abscess s/p repeat I&D bedside in ED   Started on CAP coverage but changed to vancomycin when bcx results     Family History  Problem Relation Age of Onset   Asthma Mother    Bipolar disorder Mother    Heart disease Father    Lymphoma Father    Hypertension Father    Thyroid disease Father    Hyperlipidemia Father    Diabetes Father    Cancer Paternal Grandmother        lung and breast   Bladder Cancer Paternal Grandfather    Suicidality Maternal Grandfather    Thyroid disease Brother     Social History   Tobacco Use   Smoking status: Former    Current packs/day: 0.00    Average packs/day: 0.8 packs/day for 20.0 years (15.0 ttl pk-yrs)    Types: Cigarettes    Start date: 06/08/1991    Quit date: 06/08/2011    Years since quitting: 12.2   Smokeless tobacco: Never  Vaping Use   Vaping status: Never Used  Substance Use Topics   Alcohol use: No   Drug use: No    Allergies  Allergen Reactions   Ciprofloxacin Swelling and Other (See Comments)    Per pt caused lips swell and nauseous feeling   Levaquin [Levofloxacin] Swelling and Other (See Comments)    Per pt caused lips swell and nauseous feeling   Linaclotide Other (See Comments)    Cause severe dehydration   Promethazine Other (See Comments)    Completely wipes out/fatigue   Buspar [Buspirone] Other (See Comments)    abd cramping   Advair Diskus [Fluticasone-Salmeterol] Other (See Comments)    Thrush     Biaxin [Clarithromycin] Rash   Hydroxyzine Palpitations    Review of Systems: ROS All Other ROS was negative, except mentioned above   Past Medical History:  Diagnosis Date   Anxiety    Arthritis    Asthma    Balance problems    Bipolar disorder (HCC)    Charcot ankle    Chronic fatigue    Chronic kidney disease    STAGE 3-4   Depression    Diabetes mellitus    DKA, type 1 (HCC) 11/04/2011   Elevated cholesterol    Fibromyalgia    GERD (gastroesophageal reflux disease)    Headache    History of suicidal ideation    Hyperlipemia    Hypertension    Hypothyroidism    IBS (irritable bowel syndrome)    Memory changes    Obesity    Sleep apnea    HAS C -PAP / DOES NOT USE   Stress incontinence    Pt had surgery to correct this.   Tachycardia    Tobacco abuse    Tremor    UTI (lower urinary tract infection)        Scheduled Meds:  aspirin EC  81 mg Oral QHS   buPROPion  300 mg Oral QPM   Chlorhexidine Gluconate Cloth  6 each Topical Q0600   Chlorhexidine Gluconate Cloth  6 each Topical Q0600   clopidogrel  75 mg Oral Daily   folic acid  1 mg Oral q AM   heparin  5,000 Units Subcutaneous Q8H   insulin aspart  0-9 Units Subcutaneous Q4H   insulin aspart  3 Units Subcutaneous TID WC   insulin glargine-yfgn  22 Units Subcutaneous BID   lamoTRIgine  200 mg Oral QPM   levothyroxine  175 mcg Oral Q0600   melatonin  9 mg Oral QHS   mupirocin ointment  1 Application Nasal BID   rosuvastatin  10 mg Oral QPM   sevelamer carbonate  1,600 mg Oral TID WC   sodium chloride flush  3 mL Intravenous Q12H   Continuous Infusions:  vancomycin     PRN Meds:.acetaminophen **OR** acetaminophen, albuterol, alum & mag hydroxide-simeth, diclofenac Sodium, LORazepam, ondansetron **OR** ondansetron (ZOFRAN) IV, oxyCODONE-acetaminophen   OBJECTIVE: Blood pressure (!) 108/42, pulse 77, temperature 98.3 F (36.8 C), temperature source Oral, resp. rate 13, height 5\' 9"  (1.753 m),  weight 120.7 kg, last menstrual period 03/23/2017, SpO2 96%.  Physical Exam Reviewed exam finding from h&P    Lab Results Lab Results  Component Value Date   WBC 17.9 (H) 09/06/2023   HGB 9.4 (L) 09/06/2023   HCT 30.6 (L) 09/06/2023   MCV 97.1 09/06/2023   PLT 165 09/06/2023    Lab Results  Component Value Date   CREATININE 6.35 (H) 09/06/2023   BUN 48 (  H) 09/06/2023   NA 131 (L) 09/06/2023   K 4.1 09/06/2023   CL 96 (L) 09/06/2023   CO2 25 09/06/2023    Lab Results  Component Value Date   ALT 38 09/05/2023   AST 73 (H) 09/05/2023   ALKPHOS 118 09/05/2023   BILITOT 0.6 09/05/2023      Microbiology: Recent Results (from the past 240 hours)  Resp panel by RT-PCR (RSV, Flu A&B, Covid) Anterior Nasal Swab     Status: None   Collection Time: 09/05/23 10:32 AM   Specimen: Anterior Nasal Swab  Result Value Ref Range Status   SARS Coronavirus 2 by RT PCR NEGATIVE NEGATIVE Final    Comment: (NOTE) SARS-CoV-2 target nucleic acids are NOT DETECTED.  The SARS-CoV-2 RNA is generally detectable in upper respiratory specimens during the acute phase of infection. The lowest concentration of SARS-CoV-2 viral copies this assay can detect is 138 copies/mL. A negative result does not preclude SARS-Cov-2 infection and should not be used as the sole basis for treatment or other patient management decisions. A negative result may occur with  improper specimen collection/handling, submission of specimen other than nasopharyngeal swab, presence of viral mutation(s) within the areas targeted by this assay, and inadequate number of viral copies(<138 copies/mL). A negative result must be combined with clinical observations, patient history, and epidemiological information. The expected result is Negative.  Fact Sheet for Patients:  BloggerCourse.com  Fact Sheet for Healthcare Providers:  SeriousBroker.it  This test is no t yet  approved or cleared by the [place] FDA and  has been authorized for detection and/or diagnosis of SARS-CoV-2 by FDA under an Emergency Use Authorization (EUA). This EUA will remain  in effect (meaning this test can be used) for the duration of the COVID-19 declaration under Section 564(b)(1) of the Act, 21 U.S.C.section 360bbb-3(b)(1), unless the authorization is terminated  or revoked sooner.       Influenza A by PCR NEGATIVE NEGATIVE Final   Influenza B by PCR NEGATIVE NEGATIVE Final    Comment: (NOTE) The Xpert Xpress SARS-CoV-2/FLU/RSV plus assay is intended as an aid in the diagnosis of influenza from Nasopharyngeal swab specimens and should not be used as a sole basis for treatment. Nasal washings and aspirates are unacceptable for Xpert Xpress SARS-CoV-2/FLU/RSV testing.  Fact Sheet for Patients: BloggerCourse.com  Fact Sheet for Healthcare Providers: SeriousBroker.it  This test is not yet approved or cleared by the [place] FDA and has been authorized for detection and/or diagnosis of SARS-CoV-2 by FDA under an Emergency Use Authorization (EUA). This EUA will remain in effect (meaning this test can be used) for the duration of the COVID-19 declaration under Section 564(b)(1) of the Act, 21 U.S.C. section 360bbb-3(b)(1), unless the authorization is terminated or revoked.     Resp Syncytial Virus by PCR NEGATIVE NEGATIVE Final    Comment: (NOTE) Fact Sheet for Patients: BloggerCourse.com  Fact Sheet for Healthcare Providers: SeriousBroker.it  This test is not yet approved or cleared by the [place] FDA and has been authorized for detection and/or diagnosis of SARS-CoV-2 by FDA under an Emergency Use Authorization (EUA). This EUA will remain in effect (meaning this test can be used) for the duration of the COVID-19 declaration under Section 564(b)(1) of  the Act, 21 U.S.C. section 360bbb-3(b)(1), unless the authorization is terminated or revoked.  Performed at [facility], [address], [place], [place] [address]   Blood Culture (routine x 2)     Status: None (Preliminary result)  Collection Time: 09/05/23 10:57 AM   Specimen: BLOOD  Result Value Ref Range Status   Specimen Description   Final    BLOOD BLOOD LEFT ARM LAC Performed at [facility], [address], [place], [place] [address]    Special Requests   Final    BOTTLES DRAWN AEROBIC AND ANAEROBIC Blood Culture adequate volume Performed at [facility], [address], [place], [place] [address]    Culture  Setup Time   Final    GRAM POSITIVE COCCI IN BOTH AEROBIC AND ANAEROBIC BOTTLES Gram Stain Report Called to,Read Back By and Verified With: Mitzie Na [phone], VIRAY,J CRITICAL RESULT CALLED TO, READ BACK BY AND VERIFIED WITH: B SUPER,RN@0513  09/06/23 MK Performed at [facility], [address], [place], [place] [address]    Culture GRAM POSITIVE COCCI  Final   Report Status PENDING  Incomplete  Blood Culture (routine x 2)     Status: None (Preliminary result)   Collection Time: 09/05/23 10:57 AM   Specimen: BLOOD  Result Value Ref Range Status   Specimen Description BLOOD BLOOD LEFT HAND  Final   Special Requests   Final    BOTTLES DRAWN AEROBIC AND ANAEROBIC Blood Culture adequate volume   Culture  Setup Time   Final    GRAM POSITIVE COCCI IN BOTH AEROBIC AND ANAEROBIC BOTTLES Gram Stain Report Called to,Read Back By and Verified With: Mitzie Na [phone], Darrin Nipper Performed at [facility], [address], [place], [place] [address]    Culture GRAM POSITIVE COCCI  Final   Report Status PENDING  Incomplete  Blood Culture ID Panel (Reflexed)     Status: Abnormal   Collection Time: 09/05/23 10:57 AM  Result Value Ref Range Status   Enterococcus faecalis NOT DETECTED NOT DETECTED Final   Enterococcus Faecium NOT DETECTED NOT DETECTED Final    Listeria monocytogenes NOT DETECTED NOT DETECTED Final   Staphylococcus species DETECTED (A) NOT DETECTED Final    Comment: CRITICAL RESULT CALLED TO, READ BACK BY AND VERIFIED WITH: B SUPER,RN@0513  09/06/23 MK    Staphylococcus aureus (BCID) DETECTED (A) NOT DETECTED Final    Comment: Methicillin (oxacillin)-resistant Staphylococcus aureus (MRSA). MRSA is predictably resistant to beta-lactam antibiotics (except ceftaroline). Preferred therapy is vancomycin unless clinically contraindicated. Patient requires contact precautions if  hospitalized. CRITICAL RESULT CALLED TO, READ BACK BY AND VERIFIED WITH: B SUPER,RN@0513  09/06/23 MK    Staphylococcus epidermidis NOT DETECTED NOT DETECTED Final   Staphylococcus lugdunensis NOT DETECTED NOT DETECTED Final   Streptococcus species NOT DETECTED NOT DETECTED Final   Streptococcus agalactiae NOT DETECTED NOT DETECTED Final   Streptococcus pneumoniae NOT DETECTED NOT DETECTED Final   Streptococcus pyogenes NOT DETECTED NOT DETECTED Final   A.calcoaceticus-baumannii NOT DETECTED NOT DETECTED Final   Bacteroides fragilis NOT DETECTED NOT DETECTED Final   Enterobacterales NOT DETECTED NOT DETECTED Final   Enterobacter cloacae complex NOT DETECTED NOT DETECTED Final   Escherichia coli NOT DETECTED NOT DETECTED Final   Klebsiella aerogenes NOT DETECTED NOT DETECTED Final   Klebsiella oxytoca NOT DETECTED NOT DETECTED Final   Klebsiella pneumoniae NOT DETECTED NOT DETECTED Final   Proteus species NOT DETECTED NOT DETECTED Final   Salmonella species NOT DETECTED NOT DETECTED Final   Serratia marcescens NOT DETECTED NOT DETECTED Final   Haemophilus influenzae NOT DETECTED NOT DETECTED Final   Neisseria meningitidis NOT DETECTED NOT DETECTED Final   Pseudomonas aeruginosa NOT DETECTED NOT DETECTED Final   Stenotrophomonas maltophilia NOT DETECTED NOT DETECTED  Final   Candida albicans NOT DETECTED NOT DETECTED Final   Candida auris NOT DETECTED NOT  DETECTED Final   Candida glabrata NOT DETECTED NOT DETECTED Final   Candida krusei NOT DETECTED NOT DETECTED Final   Candida parapsilosis NOT DETECTED NOT DETECTED Final   Candida tropicalis NOT DETECTED NOT DETECTED Final   Cryptococcus neoformans/gattii NOT DETECTED NOT DETECTED Final   Meth resistant mecA/C and MREJ DETECTED (A) NOT DETECTED Final    Comment: CRITICAL RESULT CALLED TO, READ BACK BY AND VERIFIED WITH: B SUPER,RN@0513  09/06/23 MK Performed at [facility], [address] [place], [place] [address]   Aerobic Culture w Gram Stain (superficial specimen)     Status: None (Preliminary result)   Collection Time: 09/05/23  1:54 PM   Specimen: Thigh  Result Value Ref Range Status   Specimen Description   Final    THIGH Performed at [facility], [address], [place], [place] [address]    Special Requests   Final    NONE Performed at [facility], [address], [place], [place] [address]    Gram Stain NO WBC SEEN RARE GRAM POSITIVE COCCI IN PAIRS   Final   Culture   Final    ABUNDANT STAPHYLOCOCCUS AUREUS SUSCEPTIBILITIES TO FOLLOW Performed at [facility], [address] [place], [place] [address]    Report Status PENDING  Incomplete  MRSA Next Gen by PCR, Nasal     Status: Abnormal   Collection Time: 09/05/23  3:00 PM   Specimen: Nasal Mucosa; Nasal Swab  Result Value Ref Range Status   MRSA by PCR Next Gen DETECTED (A) NOT DETECTED Final    Comment: RESULT CALLED TO, READ BACK BY AND VERIFIED WITHTomma Rakers RN [id number] K FORSYTH (NOTE) The GeneXpert MRSA Assay (FDA approved for NASAL specimens only), is one component of a comprehensive MRSA colonization surveillance program. It is not intended to diagnose MRSA infection nor to guide or monitor treatment for MRSA infections. Test performance is not FDA approved in patients less than 60 years old. Performed at [facility], [address] [place], [place] [address]       Serology:    Imaging: If present, new imagings (plain films, ct scans, and mri) have been personally visualized and interpreted; radiology reports have been reviewed. Decision making incorporated into the Impression / Recommendations.  3/31 cxr New perihilar airspace densities suggest pulmonary edema.      Raymondo Band, MD [facility] [phone] pager    09/06/2023, 2:07 PM

## 2023-09-06 NOTE — Progress Notes (Signed)
 Latest Reference Range & Units 09/05/23 23:23  Glucose-Capillary 70 - 99 mg/dL 161 (H)  (H): Data is abnormally high  This RN administered 20u of novolog subcutaneously. Dr Arville Care was notified. Advised to recheck 1 hour post insulin admin.  Kellogg RN

## 2023-09-06 NOTE — Consult Note (Signed)
 [facility] Renal Consultation Note    Indication for Consultation:  Management of ESRD/hemodialysis; anemia, hypertension/volume and secondary hyperparathyroidism  HPI: Jasmine [name] is a 53 y.o. 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  She presented to [facility] ED via EMS on 09/05/23 with weakness and lethargy.  EMS reported temp of 101.3.  In the ED, temp 102.2, Bp 112/59, HR 86, RR 21, SpO2 90%.  Labs notable for WBC 14.4, Hgb 10.8, Na 133, K 3.8, Cl 97, gluc 155, alb 2.9.  She was admitted under sepsis protocol (from pneumonia or cellulitis/abscess of right thigh).  We were consulted to provide dialysis during her hospitalization.   Her blood cultures and wound cultures are + for MRSA.   Past Medical History:  Diagnosis Date   Anxiety    Arthritis    Asthma    Balance problems    Bipolar disorder (HCC)    Charcot ankle    Chronic fatigue    Chronic kidney disease    STAGE 3-4   Depression    Diabetes mellitus    DKA, type 1 (HCC) 11/04/2011   Elevated cholesterol    Fibromyalgia    GERD (gastroesophageal reflux disease)    Headache    History of suicidal ideation    Hyperlipemia    Hypertension    Hypothyroidism    IBS (irritable bowel syndrome)    Memory changes    Obesity    Sleep apnea    HAS C -PAP / DOES NOT USE   Stress incontinence    Pt had surgery to correct this.   Tachycardia    Tobacco abuse    Tremor    UTI (lower urinary tract infection)    Past Surgical History:  Procedure Laterality Date   CORONARY IMAGING/OCT N/A 08/30/2023   Procedure: CORONARY IMAGING/OCT;  Surgeon: Orbie Pyo, MD;  Location: [facility]  INVASIVE CV LAB;  Service: Cardiovascular;  Laterality: N/A;   CORONARY STENT INTERVENTION N/A 08/30/2023   Procedure: CORONARY STENT INTERVENTION;  Surgeon: Orbie Pyo, MD;  Location: [facility] INVASIVE CV LAB;  Service: Cardiovascular;  Laterality: N/A;   INCONTINENCE SURGERY     LEFT HEART CATH AND CORONARY ANGIOGRAPHY N/A 07/15/2023   Procedure: LEFT HEART CATH AND CORONARY ANGIOGRAPHY;  Surgeon: Orbie Pyo, MD;  Location: [facility] INVASIVE CV LAB;  Service: Cardiovascular;  Laterality: N/A;   LEFT HEART CATH AND CORONARY ANGIOGRAPHY N/A 08/30/2023   Procedure: LEFT HEART CATH AND CORONARY ANGIOGRAPHY;  Surgeon: Orbie Pyo, MD;  Location: [facility] INVASIVE CV LAB;  Service: Cardiovascular;  Laterality: N/A;   NASAL FRACTURE SURGERY     ovary removed     OVARY SURGERY     PUBOVAGINAL SLING  08/16/2011   Procedure: Leonides Grills;  Surgeon: Valetta Fuller, MD;  Location: [facility] ORS;  Service: Urology;  Laterality: N/A;          UTERINE FIBROID SURGERY  2001   Family History:   Family History  Problem Relation Age of Onset   Asthma Mother    Bipolar disorder Mother    Heart disease Father    Lymphoma Father  Hypertension Father    Thyroid disease Father    Hyperlipidemia Father    Diabetes Father    Cancer Paternal Grandmother        lung and breast   Bladder Cancer Paternal Grandfather    Suicidality Maternal Grandfather    Thyroid disease Brother    Social History:  reports that she quit smoking about 12 years ago. Her smoking use included cigarettes. She started smoking about 32 years ago. She has a 15 pack-year smoking history. She has never used smokeless tobacco. She reports that she does not drink alcohol and does not use drugs. Allergies  Allergen Reactions   Ciprofloxacin Swelling and Other (See Comments)    Per pt caused lips swell and nauseous feeling   Levaquin [Levofloxacin] Swelling and Other (See Comments)    Per pt caused lips swell and nauseous feeling    Linaclotide Other (See Comments)    Cause severe dehydration   Promethazine Other (See Comments)    Completely wipes out/fatigue   Buspar [Buspirone] Other (See Comments)    abd cramping   Advair Diskus [Fluticasone-Salmeterol] Other (See Comments)    Thrush    Biaxin [Clarithromycin] Rash   Hydroxyzine Palpitations   Prior to Admission medications   Medication Sig Start Date End Date Taking? Authorizing Provider  acetaminophen (TYLENOL) 500 MG tablet Take 500-1,000 mg by mouth every 6 (six) hours as needed (pain.).   Yes [provider]  Ascorbic Acid (VITAMIN C PO) Take 1 tablet by mouth in the morning.   Yes [provider]  aspirin EC 81 MG tablet Take 81 mg by mouth at bedtime. Swallow whole.   Yes [provider]  azelastine (ASTELIN) 0.1 % nasal spray Place 2 sprays into both nostrils 2 (two) times daily. Use in each nostril as directed Patient taking differently: Place 2 sprays into both nostrils in the morning. Use in each nostril as directed 08/01/23  Yes Glenford Bayley, NP  buPROPion (WELLBUTRIN XL) 300 MG 24 hr tablet Take 1 tablet (300 mg total) by mouth daily. Patient taking differently: Take 300 mg by mouth every evening. 03/23/23 09/19/23 Yes Hisada, Barbee Cough, MD  carvedilol (COREG) 12.5 MG tablet Take 12.5 mg by mouth 2 (two) times daily with a meal. 01/17/23 01/17/24 Yes [provider]  Cholecalciferol (VITAMIN D-3 PO) Take 1 tablet by mouth in the morning.   Yes [provider]  clopidogrel (PLAVIX) 75 MG tablet Take 1 tablet (75 mg total) by mouth daily. 02/22/23  Yes Alver Sorrow, NP  diclofenac Sodium (VOLTAREN) 1 % GEL Apply 2 g topically 4 (four) times daily as needed (pain.).   Yes [provider]  dicyclomine (BENTYL) 10 MG capsule Take 10 mg by mouth 2 (two) times daily as needed for spasms. 12/25/22  Yes [provider]  DULoxetine (CYMBALTA) 30 MG capsule Take 1 capsule (30 mg total) by mouth daily.  07/03/23 10/31/23 Yes Neysa Hotter, MD  folic acid (FOLVITE) 800 MCG tablet Take 800 mcg by mouth in the morning.   Yes [provider]  glucagon (GLUCAGEN HYPOKIT) 1 MG SOLR injection Inject 1 mg into the skin once as needed for up to 1 dose for low blood sugar. GlucaGen HypoKit 1 mg Injection 03/31/23  Yes Johnson, Clanford L, MD  hydrALAZINE (APRESOLINE) 25 MG tablet Take 25 mg by mouth 2 (two) times daily. 05/03/23  Yes [provider]  insulin detemir (LEVEMIR) 100 UNIT/ML injection Inject 27 Units into the skin  2 (two) times daily as needed (if insulin pump inoperable). 04/04/23  Yes [provider]  lamoTRIgine (LAMICTAL) 200 MG tablet Take 1 tablet (200 mg total) by mouth daily. Patient taking differently: Take 200 mg by mouth every evening. 04/24/23 10/21/23 Yes Neysa Hotter, MD  levothyroxine (SYNTHROID) 175 MCG tablet Take 1.5 tablets (mcg) by mouth on Sundays in the mornings & take 1 tablet (175 mcg) by mouth on all other days. 11/20/21  Yes [provider]  LORazepam (ATIVAN) 0.5 MG tablet Take 1 tablet (0.5 mg total) by mouth daily as needed for anxiety. Patient taking differently: Take 0.25 mg by mouth daily as needed for anxiety or sleep. 07/04/23 10/02/23 Yes Hisada, Barbee Cough, MD  Melatonin 10 MG TABS Take 10 mg by mouth at bedtime.   Yes [provider]  NOVOLOG 100 UNIT/ML injection Inject into the skin continuous. Sliding Scale Insulin pump 2.5 units basal Bolus with meal depending on the size 03/15/22  Yes [provider]  ondansetron (ZOFRAN-ODT) 4 MG disintegrating tablet Take 4 mg by mouth every 8 (eight) hours as needed for nausea or vomiting. 03/17/22  Yes [provider]  OVER THE COUNTER MEDICATION Apply 1 application  topically at bedtime as needed (Sleep). CBD oil   Yes [provider]  OXYGEN Inhale 3 L into the lungs at bedtime.   Yes [provider]  rosuvastatin (CRESTOR) 10 MG tablet Take 1  tablet (10 mg total) by mouth daily. Patient taking differently: Take 10 mg by mouth every evening. 07/29/23 10/27/23 Yes Jodelle Red, MD  sevelamer carbonate (RENVELA) 800 MG tablet Take 1,600 mg by mouth 3 (three) times daily with meals. 07/28/23  Yes [provider]  torsemide (DEMADEX) 20 MG tablet TAKE TWO TABLETS BY MOUTH AS DIRECTED ON non dialysis DAYS   Yes [provider]  Continuous Blood Gluc Sensor MISC 1 each by Does not apply route as directed. Use as directed every 14 days. May dispense FreeStyle [employer] or similar.    [provider]   Current Facility-Administered Medications  Medication Dose Route Frequency Provider Last Rate Last Admin   acetaminophen (TYLENOL) tablet 650 mg  650 mg Oral Q6H PRN Tyrone Nine, MD   650 mg at 09/05/23 2330   Or   acetaminophen (TYLENOL) suppository 650 mg  650 mg Rectal Q6H PRN Tyrone Nine, MD       albuterol (PROVENTIL) (2.5 MG/3ML) 0.083% nebulizer solution 3 mL  3 mL Inhalation Q6H PRN Tyrone Nine, MD       aspirin EC tablet 81 mg  81 mg Oral QHS Tyrone Nine, MD   81 mg at 09/05/23 2317   buPROPion (WELLBUTRIN XL) 24 hr tablet 300 mg  300 mg Oral QPM Hazeline Junker B, MD   300 mg at 09/05/23 1703   Chlorhexidine Gluconate Cloth 2 % PADS 6 each  6 each Topical Q0600 Tyrone Nine, MD   6 each at 09/06/23 0524   Chlorhexidine Gluconate Cloth 2 % PADS 6 each  6 each Topical Q0600 [name], MD       clopidogrel (PLAVIX) tablet 75 mg  75 mg Oral Daily Tyrone Nine, MD   75 mg at 09/06/23 2130   diclofenac Sodium (VOLTAREN) 1 % topical gel 2 g  2 g Topical QID PRN Tyrone Nine, MD       folic acid (FOLVITE) tablet 1 mg  1 mg Oral q AM Hazeline Junker  B, MD   1 mg at 09/06/23 0616   heparin injection 5,000 Units  5,000 Units Subcutaneous Q8H Tyrone Nine, MD   5,000 Units at 09/06/23 4098   insulin aspart (novoLOG) injection 0-20 Units  0-20 Units Subcutaneous Q4H Mansy, Jan A, MD   7 Units  at 09/06/23 1191   lamoTRIgine (LAMICTAL) tablet 200 mg  200 mg Oral QPM Tyrone Nine, MD   200 mg at 09/05/23 1704   levothyroxine (SYNTHROID) tablet 175 mcg  175 mcg Oral Q0600 Tyrone Nine, MD   175 mcg at 09/06/23 4782   LORazepam (ATIVAN) tablet 0.5 mg  0.5 mg Oral Daily PRN Tyrone Nine, MD       melatonin tablet 9 mg  9 mg Oral QHS Tyrone Nine, MD       mupirocin ointment (BACTROBAN) 2 % 1 Application  1 Application Nasal BID Tyrone Nine, MD   1 Application at 09/06/23 0909   ondansetron (ZOFRAN) tablet 4 mg  4 mg Oral Q6H PRN Tyrone Nine, MD       Or   ondansetron [facility]) injection 4 mg  4 mg Intravenous Q6H PRN Tyrone Nine, MD       oxyCODONE-acetaminophen (PERCOCET/ROXICET) 5-325 MG per tablet 1 tablet  1 tablet Oral Daily PRN Tyrone Nine, MD       rosuvastatin (CRESTOR) tablet 10 mg  10 mg Oral QPM Tyrone Nine, MD   10 mg at 09/05/23 1704   sevelamer carbonate (RENVELA) tablet 1,600 mg  1,600 mg Oral TID WC Hazeline Junker B, MD   1,600 mg at 09/05/23 1703   sodium chloride flush (NS) 0.9 % injection 3 mL  3 mL Intravenous Q12H Tyrone Nine, MD   3 mL at 09/06/23 0906   vancomycin (VANCOCIN) IVPB 1000 mg/200 mL premix  1,000 mg Intravenous Q T,Th,Sa-HD Arabella Merles, [facility]       Labs: Basic Metabolic Panel: Recent Labs  Lab 08/30/23 1050 09/05/23 1057 09/06/23 0512  NA 110* 133* 131*  K 4.2 3.8 4.1  CL  --  97* 96*  CO2  --  23 25  GLUCOSE  --  155* 114*  BUN  --  34* 48*  CREATININE  --  5.55* 6.35*  CALCIUM  --  9.3 9.3   Liver Function Tests: Recent Labs  Lab 09/05/23 1057  AST 73*  ALT 38  ALKPHOS 118  BILITOT 0.6  PROT 6.4*  ALBUMIN 2.9*   No results for input(s): "LIPASE", "AMYLASE" in the last 168 hours. No results for input(s): "AMMONIA" in the last 168 hours. CBC: Recent Labs  Lab 08/30/23 1050 09/05/23 1057 09/06/23 0512  WBC  --  14.4* 17.9*  NEUTROABS  --  13.1*  --   HGB 12.6 10.8* 9.4*  HCT 37.0 34.0* 30.6*  MCV  --  95.2 97.1   PLT  --  203 165   Cardiac Enzymes: No results for input(s): "CKTOTAL", "CKMB", "CKMBINDEX", "TROPONINI" in the last 168 hours. CBG: Recent Labs  Lab 09/05/23 2323 09/06/23 0104 09/06/23 0300 09/06/23 0744 09/06/23 0908  GLUCAP 429* 366* 225* 93 152*   Iron Studies: No results for input(s): "IRON", "TIBC", "TRANSFERRIN", "FERRITIN" in the last 72 hours. Studies/Results: DG Chest 2 View Result Date: 09/05/2023 CLINICAL DATA:  Cough, hypoxia EXAM: CHEST - 2 VIEW COMPARISON:  Radiograph 07/09/2023 FINDINGS: Large-bore central venous line unchanged. Stable cardiac silhouette. There is interval increase in perihilar airspace densities.  Low lung volumes. No pneumothorax. IMPRESSION: New perihilar airspace densities suggest pulmonary edema. Electronically Signed   By: Genevive Bi M.D.   On: 09/05/2023 12:24    ROS: Pertinent items are noted in HPI. Physical Exam: Vitals:   09/06/23 0400 09/06/23 0600 09/06/23 0700 09/06/23 0800  BP: (!) 122/50 (!) 100/55 (!) 113/54   Pulse: 72 69 69 72  Resp: (!) 22 19  16   Temp:    97.9 F (36.6 C)  TempSrc:    Oral  SpO2: 99% 99% 96% 100%  Weight:      Height:          Weight change:   Intake/Output Summary (Last 24 hours) at 09/06/2023 0911 Last data filed at 09/06/2023 0810 Gross per 24 hour  Intake 283.01 ml  Output 1000 ml  Net -716.99 ml   BP (!) 113/54   Pulse 72   Temp 97.9 F (36.6 C) (Oral)   Resp 16   Ht 5\' 9"  (1.753 m)   Wt 120.7 kg   LMP 03/23/2017 (Approximate)   SpO2 100%   BMI 39.28 kg/m  General appearance: fatigued, no distress, and slowed mentation Head: Normocephalic, without obvious abnormality, atraumatic Eyes: negative findings: lids and lashes normal, conjunctivae and sclerae normal, and corneas clear Resp: clear to auscultation bilaterally Cardio: regular rate and rhythm, S1, S2 normal, no murmur, click, rub or gallop GI: soft, non-tender; bowel sounds normal; no masses,  no organomegaly Extremities:  extremities normal, atraumatic, no cyanosis or edema and abscess on the back of her right leg with dressing covering. Dialysis Access:  Dialysis Orders: Center: [facility]  on TTS . EDW 117.5kg HD Bath 3K/2.5Ca  Time 4:00 Heparin 2500. Access TDC BFR 400 DFR 500    Micera 75 mcg IV/HD every 4 weeks (due 09/06/23) Venofer  50 mg IV every week    Assessment/Plan:  Sepsis - MRSA in blood and right leg abscess.  Abx per primary svc.  Hopefully we can treat through this and not have to replace her TDC.  ESRD -  continue with HD on TTS schedule as HD census allows.  Plan for HD today  Hypertension/volume  - stable and will UF as tolerated to edw as bp tolerates.   Anemia  -  hgb dropping.  Will dose ESA as she was due today.  Transfuse for Hgb <7, hold off on IV iron given bacteremia.  Metabolic bone disease -  continue with home meds  Nutrition -  renal diet, carb modified.   DM type 1 - per primary  CAD s/p DES x 3 - Cardiology following.  Dual antiplatelet therapy for 6 months.  Acute on chronic hypoxic respiratory failure - will follow after HD and UF.  She is 3 kg above edw.  Irena Cords, MD [facility], [facility], [place] 09/06/2023, 9:11 AM

## 2023-09-06 NOTE — TOC Initial Note (Signed)
 Transition of Care [facility]) - Initial/Assessment Note    Patient Details  Name: Jasmine [name] MRN: [id number] Date of Birth: 1970/10/25  Transition of Care [facility] CM/SW Contact:    Karn Cassis, LCSW Phone Number: 09/06/2023, 8:20 AM  Clinical Narrative: Pt admitted with sepsis due to pneumonia. Assessment completed due to high risk readmission score. Pt reports she lives with her husband who assists with ADLs. She has cane, walker, wheelchair, and home O2 ([employer]). Pt's dialysis is TTS at [facility]. Her husband provides transportation to appointments. Pt plans to return home when medically stable. TOC will follow.                   Expected Discharge Plan: Home/Self Care Barriers to Discharge: Continued Medical Work up   Patient Goals and CMS Choice Patient states their goals for this hospitalization and ongoing recovery are:: return home   Choice offered to / list presented to : Patient Harper ownership interest in [facility]provided to::  (n/a)    Expected Discharge Plan and Services In-house Referral: Clinical Social Work     Living arrangements for the past 2 months: Single Family Home                                      Prior Living Arrangements/Services Living arrangements for the past 2 months: Single Family Home Lives with:: Spouse Patient language and need for interpreter reviewed:: Yes Do you feel safe going back to the place where you live?: Yes        Care giver support system in place?: Yes (comment) Current home services: DME (cane, walker, wheelchair, home O2) Criminal Activity/Legal Involvement Pertinent to Current Situation/Hospitalization: No - Comment as needed  Activities of Daily Living   ADL Screening (condition at time of admission) Independently performs ADLs?: Yes (appropriate for developmental age) Is the patient deaf or have difficulty hearing?: No Does the patient have difficulty seeing, even when wearing  glasses/contacts?: No Does the patient have difficulty concentrating, remembering, or making decisions?: No  Permission Sought/Granted                  Emotional Assessment     Affect (typically observed): Appropriate Orientation: : Oriented to Self, Oriented to Place, Oriented to  Time, Oriented to Situation Alcohol / Substance Use: Not Applicable Psych Involvement: No (comment)  Admission diagnosis:  Acute respiratory failure with hypoxia (HCC) [J96.01] Abscess of right thigh [L02.415] Sepsis (HCC) [A41.9] Community acquired pneumonia, unspecified laterality [J18.9] Patient Active Problem List   Diagnosis Date Noted   Sepsis (HCC) 09/05/2023   Myocardial injury 09/05/2023   Abscess of right thigh 09/05/2023   Hypertensive emergency 03/29/2023   Pseudohyponatremia 03/29/2023   Hypoalbuminemia due to protein-calorie malnutrition (HCC) 03/29/2023   End-stage renal disease on hemodialysis (HCC) 03/29/2023   Chronic HFrEF (heart failure with reduced ejection fraction) (HCC) 03/29/2023   DKA, type 1 (HCC) 03/29/2023   Asthma 03/07/2023   CAD (coronary artery disease) 03/07/2023   Orthostatic hypotension 03/15/2022   Cerebral vascular disease 03/15/2022   Nonintractable headache    Elevated troponin 09/11/2021   Hypercalcemia 09/10/2021   GERD (gastroesophageal reflux disease) 09/09/2021   DKA (diabetic ketoacidosis) (HCC) 09/09/2021   Other hyperlipidemia 09/09/2021   Chronic migraine w/o aura w/o status migrainosus, not intractable 03/23/2021   Diastolic heart failure (HCC) 03/24/2020   Chronic respiratory failure with hypoxia (  HCC) 10/05/2019   Depression 10/01/2019   Memory loss 10/01/2019   Marital estrangement 06/19/2019   Peripheral neuropathy 02/15/2019   Dizziness 09/27/2018   Polypharmacy 09/27/2018   Severe recurrent major depression without psychotic features (HCC) 08/30/2018   Bipolar 2 disorder, major depressive episode (HCC) 07/10/2018   MDD (major  depressive disorder), recurrent, severe, 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/10/2018   CKD (chronic kidney disease), stage IV (HCC) 07/01/2018   Transient Hypoglycemia 07/01/2018   Diabetic peripheral neuropathy (HCC) 01/17/2017   OSA on CPAP 07/27/2016   Wheezing 07/27/2016   Hyperglycemia 12/25/2012   Acute renal failure superimposed on stage 4 chronic kidney disease (HCC) 12/25/2012   Postnasal drip 10/02/2012   Hyperkalemia 09/25/2012   Obesity, Class III, BMI 40-49.9 (morbid obesity) (HCC) 09/24/2012   Essential hypertension 09/24/2012   Bipolar II disorder (HCC) 09/24/2012   Uncontrolled diabetes mellitus with hyperglycemia, with long-term current use of insulin (HCC) 11/04/2011   Gastroenteritis 11/03/2011   Hyponatremia 11/03/2011   Elevated lipase 11/03/2011   Acquired hypothyroidism 11/03/2011   Tobacco abuse 11/03/2011   SUI (stress urinary incontinence, female) 08/16/2011   PCP:  Richmond Campbell PA-C Pharmacy:   [facility] [place], [place] - [address] [place] [address] Phone: [phone] Fax: [phone]  [facility] [address] [place] [address] Phone: [phone] Fax: [phone]     Social Drivers of Health (SDOH) Social History: SDOH Screenings   Food Insecurity: No Food Insecurity (09/05/2023)  Housing: Low Risk  (09/05/2023)  Transportation Needs: No Transportation Needs (09/05/2023)  Utilities: Not At Risk (09/05/2023)  Alcohol Screen: Low Risk  (08/30/2018)  Depression (PHQ2-9): High Risk (12/14/2021)  Financial Resource Strain: Low Risk  (12/22/2022)   Received from [facility]  Physical Activity: Inactive (01/23/2019)  Social Connections: Unknown (10/19/2021)   [facility] from [facility]  Stress: Stress Concern Present (01/23/2019)  Tobacco Use: Medium Risk (09/05/2023)   SDOH Interventions:     Readmission Risk Interventions    09/06/2023    8:18 AM   Readmission Risk Prevention Plan  Transportation Screening Complete  HRI or Home Care Consult Complete  Social Work Consult for Recovery Care Planning/Counseling Complete  Palliative Care Screening Not Applicable  Medication Review Oceanographer) Complete

## 2023-09-06 NOTE — Plan of Care (Signed)

## 2023-09-06 NOTE — Inpatient Diabetes Management (Signed)
 Inpatient Diabetes Program Recommendations  AACE/ADA: New Consensus Statement on Inpatient Glycemic Control (2015)  Target Ranges:  Prepandial:   less than 140 mg/dL      Peak postprandial:   less than 180 mg/dL (1-2 hours)      Critically ill patients:  140 - 180 mg/dL   Lab Results  Component Value Date   GLUCAP 152 (H) 09/06/2023   HGBA1C 5.6 03/28/2023    Review of Glycemic Control  Latest Reference Range & Units 09/05/23 23:23 09/06/23 01:04 09/06/23 03:00 09/06/23 07:44 09/06/23 09:08  Glucose-Capillary 70 - 99 mg/dL 956 (H) 213 (H) 086 (H) 93 152 (H)   Diabetes history: DM 1 Outpatient Diabetes medications:  Tandem Mobi insulin pump with Dexcom sensor Uses automode Current orders for Inpatient glycemic control:  Novolog 0-20 units q 4 hours  Inpatient Diabetes Program Recommendations:    Note history of Type 1 DM. Per patient, her off pump basal insulin plan is 22 units bid (Levemir).   Spoke to patient by phone and she states that insulin pump ran out of insulin overnight.  Patient received Novolog 20 units x1 at 2332 (429), Novolog 15 units x1 at 0131 (366) and Novolog 7 units at 0336 (225).   Please reduce Novolog correction to sensitive (0-9 units q 4 hours), add Lantus 22 units bid, and Novolog 3 units tid with meals (hold if patient eats less than 50% or NPO).   Thanks,  Lorenza Cambridge, RN, BC-ADM Inpatient Diabetes Coordinator Pager [phone]  (8a-5p)

## 2023-09-06 NOTE — Progress Notes (Signed)
 Date and time results received: 09/06/23 0514  Maureen from [facility] calling to report positive blood cultures.  Test: All 4 blood culture bottles Critical Value: + staph aureus  + Mec A and MRSA   Name of Provider Notified: Mansy  Orders Received? Or Actions Taken?: MD informed.   [place]ogg RN

## 2023-09-07 ENCOUNTER — Inpatient Hospital Stay (HOSPITAL_COMMUNITY)

## 2023-09-07 ENCOUNTER — Other Ambulatory Visit (HOSPITAL_COMMUNITY): Payer: Self-pay | Admitting: *Deleted

## 2023-09-07 DIAGNOSIS — I2510 Atherosclerotic heart disease of native coronary artery without angina pectoris: Secondary | ICD-10-CM | POA: Diagnosis not present

## 2023-09-07 DIAGNOSIS — A4102 Sepsis due to Methicillin resistant Staphylococcus aureus: Secondary | ICD-10-CM | POA: Diagnosis not present

## 2023-09-07 DIAGNOSIS — F3181 Bipolar II disorder: Secondary | ICD-10-CM

## 2023-09-07 DIAGNOSIS — L02415 Cutaneous abscess of right lower limb: Secondary | ICD-10-CM | POA: Diagnosis not present

## 2023-09-07 DIAGNOSIS — I10 Essential (primary) hypertension: Secondary | ICD-10-CM

## 2023-09-07 DIAGNOSIS — R7881 Bacteremia: Secondary | ICD-10-CM

## 2023-09-07 LAB — ECHOCARDIOGRAM COMPLETE
AR max vel: 2.71 cm2
AV Area VTI: 2.61 cm2
AV Area mean vel: 2.57 cm2
AV Mean grad: 7 mmHg
AV Peak grad: 13.2 mmHg
Ao pk vel: 1.82 m/s
Area-P 1/2: 3.12 cm2
Height: 69 in
S' Lateral: 2.8 cm
Weight: 4186.98 [oz_av]

## 2023-09-07 LAB — BASIC METABOLIC PANEL WITH GFR
Anion gap: 11 (ref 5–15)
BUN: 28 mg/dL — ABNORMAL HIGH (ref 6–20)
CO2: 28 mmol/L (ref 22–32)
Calcium: 8.9 mg/dL (ref 8.9–10.3)
Chloride: 94 mmol/L — ABNORMAL LOW (ref 98–111)
Creatinine, Ser: 3.98 mg/dL — ABNORMAL HIGH (ref 0.44–1.00)
GFR, Estimated: 13 mL/min — ABNORMAL LOW (ref >60)
Glucose, Bld: 106 mg/dL — ABNORMAL HIGH (ref 70–99)
Potassium: 3.7 mmol/L (ref 3.5–5.1)
Sodium: 133 mmol/L — ABNORMAL LOW (ref 135–145)

## 2023-09-07 LAB — CBC
HCT: 30.7 % — ABNORMAL LOW (ref 36.0–46.0)
Hemoglobin: 9.7 g/dL — ABNORMAL LOW (ref 12.0–15.0)
MCH: 30.3 pg (ref 26.0–34.0)
MCHC: 31.6 g/dL (ref 30.0–36.0)
MCV: 95.9 fL (ref 80.0–100.0)
Platelets: 198 10*3/uL (ref 150–400)
RBC: 3.2 MIL/uL — ABNORMAL LOW (ref 3.87–5.11)
RDW: 13.2 % (ref 11.5–15.5)
WBC: 10.6 10*3/uL — ABNORMAL HIGH (ref 4.0–10.5)
nRBC: 0 % (ref 0.0–0.2)

## 2023-09-07 LAB — AEROBIC CULTURE W GRAM STAIN (SUPERFICIAL SPECIMEN): Gram Stain: NONE SEEN

## 2023-09-07 LAB — LEGIONELLA PNEUMOPHILA SEROGP 1 UR AG: L. pneumophila Serogp 1 Ur Ag: NEGATIVE

## 2023-09-07 LAB — HEPATITIS B CORE ANTIBODY, TOTAL: HEP B CORE AB: NEGATIVE

## 2023-09-07 LAB — GLUCOSE, CAPILLARY
Glucose-Capillary: 103 mg/dL — ABNORMAL HIGH (ref 70–99)
Glucose-Capillary: 112 mg/dL — ABNORMAL HIGH (ref 70–99)
Glucose-Capillary: 113 mg/dL — ABNORMAL HIGH (ref 70–99)
Glucose-Capillary: 126 mg/dL — ABNORMAL HIGH (ref 70–99)
Glucose-Capillary: 138 mg/dL — ABNORMAL HIGH (ref 70–99)
Glucose-Capillary: 145 mg/dL — ABNORMAL HIGH (ref 70–99)
Glucose-Capillary: 157 mg/dL — ABNORMAL HIGH (ref 70–99)

## 2023-09-07 LAB — HEPATITIS B E ANTIGEN: Hep B E Ag: NEGATIVE

## 2023-09-07 MED ORDER — LIDOCAINE HCL (PF) 1 % IJ SOLN
INTRAMUSCULAR | Status: AC
  Filled 2023-09-07: qty 5

## 2023-09-07 MED ORDER — PERFLUTREN LIPID MICROSPHERE
1.0000 mL | INTRAVENOUS | Status: AC | PRN
  Administered 2023-09-07: 3 mL via INTRAVENOUS

## 2023-09-07 MED ORDER — LIDOCAINE HCL (PF) 1 % IJ SOLN
5.0000 mL | Freq: Once | INTRAMUSCULAR | Status: AC
  Administered 2023-09-07: 5 mL via INTRADERMAL

## 2023-09-07 NOTE — Progress Notes (Signed)
*  PRELIMINARY RESULTS* Echocardiogram 2D Echocardiogram has been performed with Definity.  Stacey Drain 09/07/2023, 3:27 PM

## 2023-09-07 NOTE — Progress Notes (Addendum)
 [facility] PROGRESS NOTE  Jasmine [name] (DOB: 04/22/1971) ZOX:[id number] PCP: Richmond Campbell., PA-C  Brief Narrative: Jasmine [name] is a 53 y.o. female with a history of T1DM on insulin pump, CAD s/p DES x4 on 08/30/2023, ESRD (on HD TTS thru right IJ TDC), OSA on 3L nocturnal O2, obesity, HTN, HLD, bipolar disorder, IBS and recent I&D of right thigh abscess (3/28) who presented by EMS from home with feeling ill, weak, fatigued starting the night before associated with fever of 101.36F and shaking chills.  In the ED she was febrile with leukocytosis, tachypneic to 24/min, placed on supplemental oxygen (not on this at baseline). HR and BP were stable. Lactic acid normal at 1.2. Initial troponin 273, rising subsequently though had no dynamic ECG changes nor chest pain, felt per cardiology to be demand myocardial ischemia. CXR demonstrated a right hilar opacity.    IV antibiotics were started, blood cultures drawn, and the patient was admitted to SDU. Blood cultures have grown MRSA.   Subjective: -No further fevers -Complains of fatigue and generalized weakness  Objective: BP (!) 159/54   Pulse 62   Temp 97.9 F (36.6 C) (Oral)   Resp 19   Ht 5\' 9"  (1.753 m)   Wt 118.7 kg   LMP 03/23/2017 (Approximate)   SpO2 94%   BMI 38.64 kg/m    Physical Exam  Gen:- Awake Alert, in no acute distress  HEENT:- [place].AT, No sclera icterus, right HD catheter removed on 09/07/2023 Nose- [place] 3L/min Neck-Supple Neck,No JVD,.  Lungs-improving air movement, no wheezing  CV- S1, S2 normal, RRR Abd-  +ve B.Sounds, Abd Soft, No tenderness,    Extremity/Skin:- No  edema,   good pedal pulses  Psych-affect is appropriate, oriented x3 Neuro-generalized weakness no new focal deficits, no tremors   Assessment & Plan: 1)MRSA bacteremia and sepsis -in the setting of right thigh abscess -received Rocephin and doxycycline initially -Dr. Jarvis Newcomer previously discussed case with ID physician Dr.  Renold Don 09/07/23 -TTE-from 09/07/2023 with EF of 55 to 60%, no diastolic dysfunction, no regional wall motion abnormalities, no aortic stenosis, no mitral stenosis, no evidence of endocarditis, -TEE scheduled for 09/08/2023 --Currently receiving IV vancomycin with hemodialysis started on 09/06/2023 -Right internal jugular tunneled HD catheter removed on 09/07/2023 to allow for line holiday - Repeat blood cultures blood cultures from 09/06/2018 2504 07/28/2023 NGTD-    2)CAD s/p DES x4 08/30/2023 with troponin elevation, HTN, HLD:  - Continue DAPT - Continue statin - Hold BB, hydralazine given softening BP.   --- Cardiology consulted, recommending no further work up or treatment, pt remains without anginal symptoms. Has a scheduled follow up with Jari Favre, PA 09/14/23 at 10:55am   3)Acute on chronic hypoxic respiratory failure (nocturnal O2 chronically):  - Continue supplemental oxygen to maintain normal WOB and SpO2 >92%.  - Abx as above.   4)ESRD: TTS schedule -Nephrology consult appreciated --discussed with Dr. Arrie Aran on 09/07/2023 - Hold non-HD days torsemide with sepsis as above -Last HD session on 09/06/2023 -Right internal jugular tunneled HD catheter removed on 09/07/2023 to allow for line holiday -Plan is for general surgery consult for temporary HD catheter placement on Saturday, 09/10/2023 if cultures are still negative   5)MRSA Right thigh abscess:   - Repeat I&D in ED 3/31, continue basic local wound care and abx as above. -Antibiotics as above #1   6)DM1--last A1c 5.6 reflecting excellent diabetic control PTA - Insulin pump ran out of insulin,  -Continue Semglee 22 units Use  Novolog/Humalog Sliding scale insulin with Accu-Cheks/Fingersticks as ordered   7)Bipolar disorder: Quiescent.  - Continue home medications including lamictal, wellbutrin, cymbalta, prn ativan   8)Hypothyroidism:  - Continue synthroid at dosing, typically takes 1.5x dose on Sundays.   Shon Hale,  MD [facility] [email or web address] 09/07/2023, 6:02 PM

## 2023-09-07 NOTE — Plan of Care (Signed)

## 2023-09-07 NOTE — Progress Notes (Signed)
   [facility] has been requested to perform a transesophageal echocardiogram on [name] for Bacteremia.    Hgb 9.7, Plt 198  Patient is currently on [place] O2 3L. She is only on O2 at home via CPAP for sleep apnea. She reports CPAP compliance.    The patient does NOT have any absolute or relative contraindications to a Transesophageal Echocardiogram (TEE).  The patient has: Current Oxygen Requirement    After careful review of history and examination, the risks and benefits of transesophageal echocardiogram have been explained including risks of esophageal damage, perforation (1:10,000 risk), bleeding, pharyngeal hematoma as well as other potential complications associated with conscious sedation including aspiration, arrhythmia, respiratory failure and death. Alternatives to treatment were discussed, questions were answered. Patient is willing to proceed.   Signed, Basilio Cairo, PA-C  09/07/2023 2:23 PM

## 2023-09-07 NOTE — Procedures (Addendum)
 Pre procedural Dx: ESRD Post procedural Dx: Same  Successful removal of tunneled right chest HD catheter. Catheter and cuff removed in tact. Hemostasis achieved with manual compression and sterile dressing applied.  Tip sent for culture as requested by ordering provider.  EBL < 5 mL No immediate complications.  Please see imaging section of Epic for full dictation.  - Dressing changes every day or PRN if soiled/detached - May shower after 72 hours. Do not submerge x 7 days. - If bleeding from site occurs, sit patient up to 90 degrees and hold very firm pressure over right internal jugular and right chest exit site x 20 minutes. If bleeding persists repeat for additional 20 minutes. When bleeding has stopped redress and have patient remain at 90 degrees for additional 30 minutes. If unable to sufficiently control bleeding with manual pressure consider administration of DDAVP 0.3 mcg/kg and contact IR.  If patient requires placement of new tunneled HD catheter please contact [facility] surgical team to see if they are able to place in house, otherwise patient will require round trip to [facility] for IR placement.   Villa Herb PA-C 09/07/2023 11:37 AM

## 2023-09-07 NOTE — Progress Notes (Signed)
 Patient ID: Jasmine [name], female   DOB: 01-14-1971, 53 y.o.   MRN: [id number] S: No events overnight and tolerated HD well. O:BP (!) 143/58   Pulse 82   Temp 98.3 F (36.8 C) (Axillary)   Resp 16   Ht 5\' 9"  (1.753 m)   Wt 118.7 kg   LMP 03/23/2017 (Approximate)   SpO2 96%   BMI 38.64 kg/m   Intake/Output Summary (Last 24 hours) at 09/07/2023 0949 Last data filed at 09/07/2023 6295 Gross per 24 hour  Intake 483 ml  Output 2600 ml  Net -2117 ml   Intake/Output: I/O last 3 completed shifts: In: 763 [P.O.:720; I.V.:3; IV Piggyback:40] Out: 3000 [Urine:1000; Other:2000]  Intake/Output this shift:  Total I/O In: 3 [I.V.:3] Out: -  Weight change: -1.957 kg Gen: NAD CVS: RRR Resp:CTA Abd: +BS, soft, NT/ND Ext: no edema  Recent Labs  Lab 09/05/23 1057 09/06/23 0512 09/07/23 0431  NA 133* 131* 133*  K 3.8 4.1 3.7  CL 97* 96* 94*  CO2 23 25 28   GLUCOSE 155* 114* 106*  BUN 34* 48* 28*  CREATININE 5.55* 6.35* 3.98*  ALBUMIN 2.9*  --   --   CALCIUM 9.3 9.3 8.9  AST 73*  --   --   ALT 38  --   --    Liver Function Tests: Recent Labs  Lab 09/05/23 1057  AST 73*  ALT 38  ALKPHOS 118  BILITOT 0.6  PROT 6.4*  ALBUMIN 2.9*   No results for input(s): "LIPASE", "AMYLASE" in the last 168 hours. No results for input(s): "AMMONIA" in the last 168 hours. CBC: Recent Labs  Lab 09/05/23 1057 09/06/23 0512 09/07/23 0431  WBC 14.4* 17.9* 10.6*  NEUTROABS 13.1*  --   --   HGB 10.8* 9.4* 9.7*  HCT 34.0* 30.6* 30.7*  MCV 95.2 97.1 95.9  PLT 203 165 198   Cardiac Enzymes: No results for input(s): "CKTOTAL", "CKMB", "CKMBINDEX", "TROPONINI" in the last 168 hours. CBG: Recent Labs  Lab 09/06/23 1550 09/06/23 2002 09/07/23 0011 09/07/23 0341 09/07/23 0740  GLUCAP 159* 146* 126* 103* 138*    Iron Studies: No results for input(s): "IRON", "TIBC", "TRANSFERRIN", "FERRITIN" in the last 72 hours. Studies/Results: DG Chest 2 View Result Date: 09/05/2023 CLINICAL  DATA:  Cough, hypoxia EXAM: CHEST - 2 VIEW COMPARISON:  Radiograph 07/09/2023 FINDINGS: Large-bore central venous line unchanged. Stable cardiac silhouette. There is interval increase in perihilar airspace densities. Low lung volumes. No pneumothorax. IMPRESSION: New perihilar airspace densities suggest pulmonary edema. Electronically Signed   By: Genevive Bi M.D.   On: 09/05/2023 12:24    aspirin EC  81 mg Oral QHS   buPROPion  300 mg Oral QPM   Chlorhexidine Gluconate Cloth  6 each Topical Q0600   Chlorhexidine Gluconate Cloth  6 each Topical Q0600   clopidogrel  75 mg Oral Daily   folic acid  1 mg Oral q AM   heparin  5,000 Units Subcutaneous Q8H   insulin aspart  0-9 Units Subcutaneous Q4H   insulin aspart  3 Units Subcutaneous TID WC   insulin glargine-yfgn  22 Units Subcutaneous BID   lamoTRIgine  200 mg Oral QPM   levothyroxine  175 mcg Oral Q0600   melatonin  9 mg Oral QHS   mupirocin ointment  1 Application Nasal BID   rosuvastatin  10 mg Oral QPM   sevelamer carbonate  1,600 mg Oral TID WC   sodium chloride flush  3 mL Intravenous Q12H  BMET    Component Value Date/Time   NA 133 (L) 09/07/2023 0431   NA 138 08/15/2023 0000   K 3.7 09/07/2023 0431   CL 94 (L) 09/07/2023 0431   CO2 28 09/07/2023 0431   GLUCOSE 106 (H) 09/07/2023 0431   BUN 28 (H) 09/07/2023 0431   BUN 30 (H) 08/15/2023 0000   CREATININE 3.98 (H) 09/07/2023 0431   CALCIUM 8.9 09/07/2023 0431   GFRNONAA 13 (L) 09/07/2023 0431   GFRAA 31 (L) 09/12/2019 0413   CBC    Component Value Date/Time   WBC 10.6 (H) 09/07/2023 0431   RBC 3.20 (L) 09/07/2023 0431   HGB 9.7 (L) 09/07/2023 0431   HGB 11.5 08/15/2023 0000   HCT 30.7 (L) 09/07/2023 0431   HCT 35.8 08/15/2023 0000   PLT 198 09/07/2023 0431   PLT 254 08/15/2023 0000   MCV 95.9 09/07/2023 0431   MCV 93 08/15/2023 0000   MCH 30.3 09/07/2023 0431   MCHC 31.6 09/07/2023 0431   RDW 13.2 09/07/2023 0431   RDW 12.2 08/15/2023 0000    LYMPHSABS 0.3 (L) 09/05/2023 1057   MONOABS 0.9 09/05/2023 1057   EOSABS 0.0 09/05/2023 1057   BASOSABS 0.0 09/05/2023 1057    Dialysis Orders: Center: [facility]  on TTS . EDW 117.5kg HD [place] 3K/2.5Ca  Time 4:00 Heparin 2500. Access TDC BFR 400 DFR 500    Micera 75 mcg IV/HD every 4 weeks (due 09/06/23) Venofer  50 mg IV every week      Assessment/Plan:  Sepsis - MRSA in blood and right leg abscess.  Abx per primary svc.  ID recommends line holiday.  Will have IR remove TDC today and then surgery can replace on Fri or Saturday this week.    ESRD -  completed HD yesterday but will require line holiday.  IR to remove [facility] today and then plan is for surgery to place new HD catheter on Saturday followed by dialysis.   Hypertension/volume  - stable and will UF as tolerated to edw as bp tolerates.   Anemia  -  hgb dropping.  Will dose ESA as she was due today.  Transfuse for Hgb <7, hold off on IV iron given bacteremia.  Metabolic bone disease -  continue with home meds  Nutrition -  renal diet, carb modified.   DM type 1 - per primary  CAD s/p DES x 3 - Cardiology following.  Dual antiplatelet therapy for 6 months.  Acute on chronic hypoxic respiratory failure - will follow after HD and UF.  She is 1 kg above edw.  Irena Cords, MD [facility]

## 2023-09-08 ENCOUNTER — Encounter (HOSPITAL_COMMUNITY)
Admission: EM | Disposition: A | Discharge status: Home / Self Care | Payer: Self-pay | Source: Home / Self Care | Attending: Family Medicine

## 2023-09-08 ENCOUNTER — Inpatient Hospital Stay (HOSPITAL_COMMUNITY)

## 2023-09-08 ENCOUNTER — Inpatient Hospital Stay (HOSPITAL_COMMUNITY): Admitting: Certified Registered"

## 2023-09-08 ENCOUNTER — Encounter (HOSPITAL_COMMUNITY): Payer: Self-pay | Admitting: Family Medicine

## 2023-09-08 DIAGNOSIS — K2100 Gastro-esophageal reflux disease with esophagitis, without bleeding: Secondary | ICD-10-CM

## 2023-09-08 DIAGNOSIS — R7881 Bacteremia: Secondary | ICD-10-CM

## 2023-09-08 DIAGNOSIS — N186 End stage renal disease: Secondary | ICD-10-CM

## 2023-09-08 DIAGNOSIS — A4102 Sepsis due to Methicillin resistant Staphylococcus aureus: Secondary | ICD-10-CM | POA: Diagnosis not present

## 2023-09-08 DIAGNOSIS — E039 Hypothyroidism, unspecified: Secondary | ICD-10-CM | POA: Diagnosis not present

## 2023-09-08 DIAGNOSIS — I361 Nonrheumatic tricuspid (valve) insufficiency: Secondary | ICD-10-CM

## 2023-09-08 DIAGNOSIS — Z992 Dependence on renal dialysis: Secondary | ICD-10-CM

## 2023-09-08 HISTORY — PX: TEE WITHOUT CARDIOVERSION: SHX5443

## 2023-09-08 LAB — CULTURE, BLOOD (ROUTINE X 2)
Special Requests: ADEQUATE
Special Requests: ADEQUATE

## 2023-09-08 LAB — GLUCOSE, CAPILLARY
Glucose-Capillary: 100 mg/dL — ABNORMAL HIGH (ref 70–99)
Glucose-Capillary: 106 mg/dL — ABNORMAL HIGH (ref 70–99)
Glucose-Capillary: 160 mg/dL — ABNORMAL HIGH (ref 70–99)
Glucose-Capillary: 177 mg/dL — ABNORMAL HIGH (ref 70–99)
Glucose-Capillary: 244 mg/dL — ABNORMAL HIGH (ref 70–99)
Glucose-Capillary: 376 mg/dL — ABNORMAL HIGH (ref 70–99)
Glucose-Capillary: 66 mg/dL — ABNORMAL LOW (ref 70–99)

## 2023-09-08 LAB — HEPATITIS B SURFACE ANTIBODY, QUANTITATIVE: Hep B S AB Quant (Post): <3.5 m[IU]/mL — ABNORMAL LOW

## 2023-09-08 LAB — ECHO TEE

## 2023-09-08 SURGERY — TRANSESOPHAGEAL ECHOCARDIOGRAM (TEE) (CATHLAB)
Anesthesia: Monitor Anesthesia Care

## 2023-09-08 SURGERY — ECHOCARDIOGRAM, TRANSESOPHAGEAL
Anesthesia: General

## 2023-09-08 MED ORDER — BUTAMBEN-TETRACAINE-BENZOCAINE 2-2-14 % EX AERO
INHALATION_SPRAY | CUTANEOUS | Status: AC
  Filled 2023-09-08: qty 5

## 2023-09-08 MED ORDER — SODIUM CHLORIDE 0.9 % IV SOLN: 500.0000 mL | Freq: Once | INTRAVENOUS | Status: AC

## 2023-09-08 MED ORDER — INSULIN ASPART 100 UNIT/ML IJ SOLN
2.0000 [IU] | Freq: Three times a day (TID) | INTRAMUSCULAR | Status: DC
  Administered 2023-09-08 – 2023-09-09 (×3): 2 [IU] via SUBCUTANEOUS

## 2023-09-08 MED ORDER — INSULIN GLARGINE-YFGN 100 UNIT/ML ~~LOC~~ SOLN
20.0000 [IU] | Freq: Two times a day (BID) | SUBCUTANEOUS | Status: DC
  Administered 2023-09-08 – 2023-09-09 (×2): 20 [IU] via SUBCUTANEOUS
  Filled 2023-09-08 (×4): qty 0.2

## 2023-09-08 MED ORDER — DEXTROSE 50 % IV SOLN
12.5000 g | INTRAVENOUS | Status: AC
  Administered 2023-09-08: 12.5 g via INTRAVENOUS

## 2023-09-08 MED ORDER — LIDOCAINE HCL (PF) 2 % IJ SOLN
INTRAMUSCULAR | Status: DC | PRN
  Administered 2023-09-08: 100 mg via INTRADERMAL

## 2023-09-08 MED ORDER — DARBEPOETIN ALFA 60 MCG/0.3ML IJ SOSY
60.0000 ug | PREFILLED_SYRINGE | INTRAMUSCULAR | Status: DC
  Administered 2023-09-09: 60 ug via SUBCUTANEOUS
  Filled 2023-09-08 (×2): qty 0.3

## 2023-09-08 MED ORDER — BUTAMBEN-TETRACAINE-BENZOCAINE 2-2-14 % EX AERO
INHALATION_SPRAY | CUTANEOUS | Status: DC | PRN
  Administered 2023-09-08: 2 via TOPICAL

## 2023-09-08 MED ORDER — MIDAZOLAM HCL 2 MG/2ML IJ SOLN: 2.0000 mg | Freq: Once | INTRAMUSCULAR | Status: DC

## 2023-09-08 MED ORDER — CHLORHEXIDINE GLUCONATE 0.12 % MT SOLN
15.0000 mL | Freq: Once | OROMUCOSAL | Status: DC
  Filled 2023-09-08: qty 15

## 2023-09-08 MED ORDER — ORAL CARE MOUTH RINSE: 15.0000 mL | OROMUCOSAL | Status: DC | PRN

## 2023-09-08 MED ORDER — LACTATED RINGERS IV SOLN: INTRAVENOUS | Status: DC | PRN

## 2023-09-08 MED ORDER — ORAL CARE MOUTH RINSE: 15.0000 mL | Freq: Once | OROMUCOSAL | Status: DC

## 2023-09-08 MED ORDER — VANCOMYCIN HCL IN DEXTROSE 1-5 GM/200ML-% IV SOLN
1000.0000 mg | INTRAVENOUS | Status: DC
  Administered 2023-09-11: 1000 mg via INTRAVENOUS

## 2023-09-08 MED ORDER — PROPOFOL 500 MG/50ML IV EMUL
INTRAVENOUS | Status: DC | PRN
  Administered 2023-09-08: 150 ug/kg/min via INTRAVENOUS
  Administered 2023-09-08: 100 mg via INTRAVENOUS
  Administered 2023-09-08: 100 ug/kg/min via INTRAVENOUS

## 2023-09-08 MED ORDER — INSULIN GLARGINE-YFGN 100 UNIT/ML ~~LOC~~ SOLN: 20.0000 [IU] | Freq: Two times a day (BID) | SUBCUTANEOUS | Status: DC

## 2023-09-08 MED ORDER — DEXTROSE 50 % IV SOLN
INTRAVENOUS | Status: AC
  Filled 2023-09-08: qty 50

## 2023-09-08 NOTE — Progress Notes (Signed)
 [facility] PROGRESS NOTE  Jasmine [name] (DOB: 02-May-1971) VWU:[id number] PCP: Richmond Campbell., PA-C  Brief Narrative: Jasmine [name] is a 53 y.o. female with a history of T1DM on insulin pump, CAD s/p DES x4 on 08/30/2023, ESRD (on HD TTS thru right IJ TDC), OSA on 3L nocturnal O2, obesity, HTN, HLD, bipolar disorder, IBS and recent I&D of right thigh abscess (3/28) who presented by EMS from home with feeling ill, weak, fatigued starting the night before associated with fever of 101.15F and shaking chills.  In the ED she was febrile with leukocytosis, tachypneic to 24/min, placed on supplemental oxygen (not on this at baseline). HR and BP were stable. Lactic acid normal at 1.2. Initial troponin 273, rising subsequently though had no dynamic ECG changes nor chest pain, felt per cardiology to be demand myocardial ischemia. CXR demonstrated a right hilar opacity.    IV antibiotics were started, blood cultures drawn, and the patient was admitted to SDU. Blood cultures have grown MRSA.   Subjective: -Tolerated TEE well -Resting without a TEE No chest pains No fever  Or chills   Objective: BP (!) 172/80 (BP Location: Left Wrist)   Pulse 67   Temp 99.3 F (37.4 C) (Oral)   Resp 20   Ht 5\' 9"  (1.753 m)   Wt 118.7 kg   LMP 03/23/2017 (Approximate)   SpO2 92%   BMI 38.64 kg/m    Physical Exam  Gen:- Awake Alert, in no acute distress  HEENT:- [place].AT, No sclera icterus, right HD catheter removed on 09/07/2023 Nose- [place] 3L/min Neck-Supple Neck,No JVD,.  Lungs-improving air movement, no wheezing  CV- S1, S2 normal, RRR Abd-  +ve B.Sounds, Abd Soft, No tenderness,    Extremity/Skin:- No  edema,   good pedal pulses  Psych-affect is appropriate, oriented x3 Neuro-generalized weakness no new focal deficits, no tremors   Assessment & Plan: 1)MRSA bacteremia and sepsis -in the setting of right thigh abscess -received Rocephin and doxycycline initially -Dr. Jarvis Newcomer previously  discussed case with ID physician Dr. Renold Don 09/08/23 -TTE-from 09/07/2023 with EF of 55 to 60%, no diastolic dysfunction, no regional wall motion abnormalities, no aortic stenosis, no mitral stenosis, no evidence of endocarditis, -TEE scheduled on 09/08/2023 without vegetations --Currently receiving IV vancomycin with hemodialysis started on 09/06/2023 -Right internal jugular tunneled HD catheter removed on 09/07/2023 to allow for line holiday - Repeat blood cultures blood cultures from 09/06/2023 and 09/07/23 NGTD-    2)CAD s/p DES x4 08/30/2023 with troponin elevation, HTN, HLD:  - Continue DAPT - Continue statin - Hold BB, hydralazine given softening BP.   --- Cardiology consulted, recommending no further work up or treatment, pt remains without anginal symptoms. Has a scheduled follow up with Jari Favre, PA 09/14/23 at 10:55am   3)Acute on chronic hypoxic respiratory failure (nocturnal O2 chronically):  - Continue supplemental oxygen to maintain normal WOB and SpO2 >92%.  - Abx as above.   4)ESRD: TTS schedule -Nephrology consult appreciated --discussed with Dr. Arrie Aran on 09/07/2023 - Hold non-HD days torsemide with sepsis as above -Last HD session on 09/06/2023 -Right internal jugular tunneled HD catheter removed on 09/07/2023 to allow for line holiday -Plan is for general surgery consult for temporary HD catheter placement on Saturday, 09/10/2023 if cultures are still negative   5)MRSA Right thigh abscess:   - Repeat I&D in ED 3/31, continue basic local wound care and abx as above. -Antibiotics as above #1   6)DM1--last A1c 5.6 reflecting excellent diabetic control PTA -  Insulin pump ran out of insulin,  -Continue Semglee 22 units Use Novolog/Humalog Sliding scale insulin with Accu-Cheks/Fingersticks as ordered   7)Bipolar disorder: Quiescent.  - Continue home medications including lamictal, wellbutrin, cymbalta, prn ativan   8)Hypothyroidism:  - Continue synthroid at dosing, typically  takes 1.5x dose on Sundays.   Shon Hale, MD [facility] [email or web address] 09/08/2023, 7:08 PM

## 2023-09-08 NOTE — Anesthesia Preprocedure Evaluation (Signed)
 Anesthesia Evaluation  Patient identified by MRN, date of birth, ID band Patient awake    Reviewed: Allergy & Precautions, H&P , NPO status , Patient's Chart, lab work & pertinent test results  Airway Mallampati: III  TM Distance: >3 FB Neck ROM: full    Dental  (+) Poor Dentition   Pulmonary shortness of breath and with exertion, asthma , sleep apnea and Oxygen sleep apnea , former smoker -Chronic respiratory failure with hypoxia - followed by Pulmonology, home O2 at night and as needed -Awaiting repeat sleep study results, does not currently use CPAP   Pulmonary exam normal breath sounds clear to auscultation       Cardiovascular hypertension, + CAD and + Cardiac Stents  Normal cardiovascular exam Rhythm:regular Rate:Normal  EF 55% yesterday   Neuro/Psych  Headaches PSYCHIATRIC DISORDERS Anxiety Depression Bipolar Disorder    Neuromuscular disease    GI/Hepatic Neg liver ROS,GERD  Controlled,,  Endo/Other  diabetesHypothyroidism  Class 3 obesity  Renal/GU ESRF and DialysisRenal disease  negative genitourinary   Musculoskeletal  (+) Arthritis , Osteoarthritis,  Fibromyalgia -  Abdominal   Peds  Hematology  (+) Blood dyscrasia, anemia Hct 30   Anesthesia Other Findings Past Medical History: No date: Anxiety No date: Arthritis No date: Asthma No date: Balance problems No date: Bipolar disorder (HCC) No date: Charcot ankle No date: Chronic fatigue No date: Chronic kidney disease     Comment:  STAGE 3-4 No date: Depression No date: Diabetes mellitus 11/04/2011: DKA, type 1 (HCC) No date: Elevated cholesterol No date: Fibromyalgia No date: GERD (gastroesophageal reflux disease) No date: Headache No date: History of suicidal ideation No date: Hyperlipemia No date: Hypertension No date: Hypothyroidism No date: IBS (irritable bowel syndrome) No date: Memory changes No date: Obesity No date: Sleep apnea      Comment:  HAS C -PAP / DOES NOT USE No date: Stress incontinence     Comment:  Pt had surgery to correct this. No date: Tachycardia No date: Tobacco abuse No date: Tremor No date: UTI (lower urinary tract infection)  Past Surgical History: No date: INCONTINENCE SURGERY No date: NASAL FRACTURE SURGERY No date: ovary removed No date: OVARY SURGERY 08/16/2011: PUBOVAGINAL SLING     Comment:  Procedure: Leonides Grills;  Surgeon: Valetta Fuller,              MD;  Location: [facility] ORS;  Service: Urology;  Laterality:               N/A;        2001: UTERINE FIBROID SURGERY  BMI    Body Mass Index: 44.30 kg/m      Reproductive/Obstetrics negative OB ROS                             Anesthesia Physical Anesthesia Plan  ASA: 4  Anesthesia Plan: General   Post-op Pain Management: Minimal or no pain anticipated   Induction: Intravenous  PONV Risk Score and Plan: Propofol infusion  Airway Management Planned: Nasal Cannula and Natural Airway  Additional Equipment: None  Intra-op Plan:   Post-operative Plan:   Informed Consent: I have reviewed the patients History and Physical, chart, labs and discussed the procedure including the risks, benefits and alternatives for the proposed anesthesia with the patient or authorized representative who has indicated his/her understanding and acceptance.     Dental Advisory Given  Plan Discussed with: CRNA  Anesthesia Plan Comments:  Anesthesia Quick Evaluation

## 2023-09-08 NOTE — CV Procedure (Signed)
 CV Procedure Note  Procedure: Transesophageal echocardiogram Physician: Dr Dina Rich MD Indication: Bacteremia   Patient was brought to the procedure suite after appropriate consent was obtained. The posterior oropharynx was anesthesized with 2% viscous lidocaine and bite block placed. The patient was placed in the left lateral decubitus position. Sedation achieved with the assistance of anesthesiology, for details please refer to there documentation. TEE probe intubated into the esophagus without difficulty and several images obtained. For full findings please refer to full TEE report. In summary no evidence of endocarditis. Cardiopulmonary monitoring performed throughout the procedure, she tolerated well without complications.   Dina Rich MD

## 2023-09-08 NOTE — Inpatient Diabetes Management (Signed)
 Inpatient Diabetes Program Recommendations  AACE/ADA: New Consensus Statement on Inpatient Glycemic Control (2015)  Target Ranges:  Prepandial:   less than 140 mg/dL      Peak postprandial:   less than 180 mg/dL (1-2 hours)      Critically ill patients:  140 - 180 mg/dL   Lab Results  Component Value Date   GLUCAP 100 (H) 09/08/2023   HGBA1C 5.6 03/28/2023    Review of Glycemic Control  Latest Reference Range & Units 09/08/23 05:01 09/08/23 05:35 09/08/23 07:34  Glucose-Capillary 70 - 99 mg/dL 66 (L) 960 (H) 454 (H)   Diabetes history: DM 2 Outpatient Diabetes medications:  Levemir 27 units bid (if patient is having issues with insulin pump) Tandem Mobi insulin pump with Dexcom sensor Uses automode Current orders for Inpatient glycemic control:  Novolog 0-9 units q 4 hours Novolog 3 units tid with meals Semglee 22 units bid  Inpatient Diabetes Program Recommendations:    Note patient is NPO for TEE today.  Please consider reducing Novolog correction to "very sensitive" and reduce Semglee slightly to 20 units bid.    Thanks, Lorenza Cambridge, RN, BC-ADM Inpatient Diabetes Coordinator Pager [phone]  (8a-5p)

## 2023-09-08 NOTE — Progress Notes (Signed)
 Pharmacy Antibiotic Note  Jasmine [name] is a 53 y.o. female admitted on 09/05/2023 with weakness and found to have gram positive cocci in 4/4 blood cultures. Patient has had recent I&D of right thigh abscess growing gram positive cocci in pairs. Pharmacy has been consulted for vancomycin dosing.  Patient is ESRD TTS - last HD was 4/1 then HD cath removed 4/2 for line holiday. Next HD is planned for 4/5 after HD cath replaced. Will remove order for vancomycin today.   Plan: -Vancomycin 1g IV after every HD session - next 4/5 -Follow up HD schedule inpatient -TEE planned for today 4/3  Height: 5\' 9"  (175.3 cm) Weight: 118.7 kg (261 lb 11 oz) IBW/kg (Calculated) : 66.2  Temp (24hrs), Avg:97.9 F (36.6 C), Min:97.8 F (36.6 C), Max:98.1 F (36.7 C)  Recent Labs  Lab 09/05/23 1057 09/06/23 0512 09/07/23 0431  WBC 14.4* 17.9* 10.6*  CREATININE 5.55* 6.35* 3.98*  LATICACIDVEN 1.2  --   --     Estimated Creatinine Clearance: 23 mL/min (A) (by C-G formula based on SCr of 3.98 mg/dL (H)).    Allergies  Allergen Reactions   Ciprofloxacin Swelling and Other (See Comments)    Per pt caused lips swell and nauseous feeling   Levaquin [Levofloxacin] Swelling and Other (See Comments)    Per pt caused lips swell and nauseous feeling   Linaclotide Other (See Comments)    Cause severe dehydration   Promethazine Other (See Comments)    Completely wipes out/fatigue   Buspar [Buspirone] Other (See Comments)    abd cramping   Advair Diskus [Fluticasone-Salmeterol] Other (See Comments)    Thrush    Biaxin [Clarithromycin] Rash   Hydroxyzine Palpitations    Antimicrobials this admission: Vancomycin 4/1 >>  Microbiology results: 4/2 Bcx: ngtd 4/1 Bcx: ngtd 3/31 Bcx: GPC BCID: MRSA 3/31 Wound Cx: MRSA 3/31 MRSA: +  Thank you for allowing pharmacy to be a part of this patient's care.  Sheppard Coil PharmD., BCPS Clinical Pharmacist 09/08/2023 10:25 AM

## 2023-09-08 NOTE — Progress Notes (Addendum)
 [facility] NEPHROLOGY PROGRESS NOTE  Assessment/ Plan: Pt is a 53 y.o. yo female   Dialysis Orders: Center: [facility]  on TTS . EDW 117.5kg HD Bath 3K/2.5Ca  Time 4:00 Heparin 2500. Access TDC BFR 400 DFR 500    Micera 75 mcg IV/HD every 4 weeks (due 09/06/23) Venofer  50 mg IV every week  # Sepsis - MRSA in blood and right leg abscess.  On Iv Vancomycin. ID recommends line holiday.   -TDC removed on 4/2.  -Consulted surgeon Dr.Pappayliou who can place only temporary HD catheter.  Blood culture from 4/2 remains negative.  We will plan for line holiday at least 48 to 72 hours.  If cultures are negative then I would suggest placing tunneled catheter on Saturday, consult IR.  If IR is not able to place a line over the weekend then the patient will need temporary HD catheter by the surgeon.  Discussed with primary team, surgeon and dialysis nurse.  # ESRD -last HD on 4/1.  IR removed HD catheter on 4/2.  Plan for next dialysis likely on Saturday after placing new HD catheter.    # Hypertension/volume  - stable and will UF as tolerated to edw as bp tolerates.   # Anemia  -  hgb dropping.  Continue erythropoietin.  Transfuse for Hgb <7, hold off on IV iron given bacteremia.  # Metabolic bone disease -  continue with home meds  # Acute on chronic hypoxic respiratory failure -UF with the HD and may need to lower dry weight.  Subjective: Seen and examined at bedside.  Denies nausea, vomiting, chest pain, shortness of breath.  No new event except removal of tunneled HD catheter yesterday. Objective Vital signs in last 24 hours: Vitals:   09/07/23 1636 09/07/23 1812 09/07/23 1941 09/08/23 0500  BP:  (!) 140/85 129/61 134/66  Pulse:  74 70 76  Resp:   20   Temp: 97.9 F (36.6 C) 97.8 F (36.6 C) 97.9 F (36.6 C) 98.1 F (36.7 C)  TempSrc: Oral Oral Oral   SpO2:  98% 97% 97%  Weight:      Height:       Weight change:   Intake/Output Summary (Last 24 hours) at 09/08/2023  0914 Last data filed at 09/08/2023 0500 Gross per 24 hour  Intake 676.76 ml  Output --  Net 676.76 ml       Labs: RENAL PANEL Recent Labs  Lab 09/05/23 1057 09/06/23 0512 09/07/23 0431  NA 133* 131* 133*  K 3.8 4.1 3.7  CL 97* 96* 94*  CO2 23 25 28   GLUCOSE 155* 114* 106*  BUN 34* 48* 28*  CREATININE 5.55* 6.35* 3.98*  CALCIUM 9.3 9.3 8.9  ALBUMIN 2.9*  --   --     Liver Function Tests: Recent Labs  Lab 09/05/23 1057  AST 73*  ALT 38  ALKPHOS 118  BILITOT 0.6  PROT 6.4*  ALBUMIN 2.9*   No results for input(s): "LIPASE", "AMYLASE" in the last 168 hours. No results for input(s): "AMMONIA" in the last 168 hours. CBC: Recent Labs    03/29/23 1030 03/30/23 0511 08/15/23 0000 08/30/23 1050 09/05/23 1057 09/06/23 0512 09/07/23 0431  HGB  --    < > 11.5 12.6 10.8* 9.4* 9.7*  MCV  --    < > 93  --  95.2 97.1 95.9  VITAMINB12 362  --   --   --   --   --   --  FOLATE 19.4  --   --   --   --   --   --    < > = values in this interval not displayed.    Cardiac Enzymes: No results for input(s): "CKTOTAL", "CKMB", "CKMBINDEX", "TROPONINI" in the last 168 hours. CBG: Recent Labs  Lab 09/07/23 1935 09/07/23 2338 09/08/23 0501 09/08/23 0535 09/08/23 0734  GLUCAP 112* 157* 66* 106* 100*    Iron Studies: No results for input(s): "IRON", "TIBC", "TRANSFERRIN", "FERRITIN" in the last 72 hours. Studies/Results: ECHOCARDIOGRAM COMPLETE Result Date: 09/07/2023    ECHOCARDIOGRAM REPORT   Patient Name:   Jasmine [name] Date of Exam: 09/07/2023 Medical Rec #:  [id number]         Height:       69.0 in Accession #:    [id number]        Weight:       261.7 lb Date of Birth:  1970/06/22         BSA:          2.316 m Patient Age:    52 years          BP:           108/42 mmHg Patient Gender: F                 HR:           77 bpm. [facility] Procedure: 2D Echo, Cardiac Doppler, Color Doppler and Intracardiac            Opacification Agent (Both Spectral and  Color Flow Doppler were            utilized during procedure). Indications:    Bacteremia R78.81  History:        Patient has prior history of Echocardiogram examinations, most                 recent 07/15/2023. CAD; Risk Factors:Hypertension, Diabetes,                 Dyslipidemia and Former Smoker. ESRD on dailysis.  Sonographer:    Celesta Gentile RCS Referring Phys: [phone] [place] IMPRESSIONS  1. Left ventricular ejection fraction, by estimation, is 55 to 60%. The left ventricle has normal function. The left ventricle has no regional wall motion abnormalities. Left ventricular diastolic parameters were normal.  2. Right ventricular systolic function is normal. The right ventricular size is normal.  3. The mitral valve is normal in structure. Trivial mitral valve regurgitation. No evidence of mitral stenosis.  4. The tricuspid valve is abnormal.  5. The aortic valve has an indeterminant number of cusps. Aortic valve regurgitation is not visualized. No aortic stenosis is present.  6. The inferior vena cava is normal in size with greater than 50% respiratory variability, suggesting right atrial pressure of 3 mmHg. FINDINGS  Left Ventricle: Left ventricular ejection fraction, by estimation, is 55 to 60%. The left ventricle has normal function. The left ventricle has no regional wall motion abnormalities. Definity contrast agent was given IV to delineate the left ventricular  endocardial borders. The left ventricular internal cavity size was normal in size. There is no left ventricular hypertrophy. Left ventricular diastolic parameters were normal. Right Ventricle: The right ventricular size is normal. Right vetricular wall thickness was not well visualized. Right ventricular systolic function is normal. Left Atrium: Left atrial size was normal in size. Right Atrium: Right atrial size was normal in size. Pericardium: There is  no evidence of pericardial effusion. Mitral Valve: The mitral valve is normal in structure.  There is mild thickening of the mitral valve leaflet(s). There is mild calcification of the mitral valve leaflet(s). Mild mitral annular calcification. Trivial mitral valve regurgitation. No evidence  of mitral valve stenosis. Tricuspid Valve: The tricuspid valve is abnormal. Tricuspid valve regurgitation is mild . No evidence of tricuspid stenosis. Aortic Valve: The aortic valve has an indeterminant number of cusps. Aortic valve regurgitation is not visualized. No aortic stenosis is present. Aortic valve mean gradient measures 7.0 mmHg. Aortic valve peak gradient measures 13.2 mmHg. Aortic valve area, by VTI measures 2.61 cm. Pulmonic Valve: The pulmonic valve was not well visualized. Pulmonic valve regurgitation is not visualized. No evidence of pulmonic stenosis. Aorta: The aortic root is normal in size and structure and the ascending aorta was not well visualized. Venous: The inferior vena cava is normal in size with greater than 50% respiratory variability, suggesting right atrial pressure of 3 mmHg. IAS/Shunts: No atrial level shunt detected by color flow Doppler.  LEFT VENTRICLE PLAX 2D LVIDd:         5.10 cm   Diastology LVIDs:         2.80 cm   LV e' medial:    7.29 cm/s LV PW:         1.00 cm   LV E/e' medial:  19.5 LV IVS:        1.00 cm   LV e' lateral:   12.30 cm/s LVOT diam:     2.10 cm   LV E/e' lateral: 11.5 LV SV:         112 LV SV Index:   48 LVOT Area:     3.46 cm  RIGHT VENTRICLE RV S prime:     15.20 cm/s TAPSE (M-mode): 2.0 cm LEFT ATRIUM             Index        RIGHT ATRIUM           Index LA diam:        4.70 cm 2.03 cm/m   RA Area:     12.10 cm LA Vol (A2C):   71.3 ml 30.79 ml/m  RA Volume:   27.00 ml  11.66 ml/m LA Vol (A4C):   71.2 ml 30.74 ml/m LA Biplane Vol: 72.6 ml 31.35 ml/m  AORTIC VALVE AV Area (Vmax):    2.71 cm AV Area (Vmean):   2.57 cm AV Area (VTI):     2.61 cm AV Vmax:           182.00 cm/s AV Vmean:          125.000 cm/s AV VTI:            0.429 m AV Peak Grad:       13.2 mmHg AV Mean Grad:      7.0 mmHg LVOT Vmax:         142.50 cm/s LVOT Vmean:        92.800 cm/s LVOT VTI:          0.324 m LVOT/AV VTI ratio: 0.75  AORTA Ao Root diam: 3.30 cm MITRAL VALVE                TRICUSPID VALVE MV Area (PHT): 3.12 cm     TR Peak grad:   26.0 mmHg MV Decel Time: 243 msec     TR Vmax:        255.00 cm/s MV  E velocity: 142.00 cm/s MV A velocity: 115.00 cm/s  SHUNTS MV E/A ratio:  1.23         Systemic VTI:  0.32 m                             Systemic Diam: 2.10 cm Dina Rich MD Electronically signed by Dina Rich MD Signature Date/Time: 09/07/2023/3:34:00 PM    Final    DG Chest Fluoro Result Date: 09/07/2023 INDICATION: Patient with history of ESRD on HD via right chest HD catheter placed August 2024 at outside hospital, currently admitted for sepsis with concern for HD catheter as source. Request for removal of tunneled HD catheter for line holiday. EXAM: REMOVAL OF TUNNELED HEMODIALYSIS CATHETER WITHOUT IMAGE GUIDANCE MEDICATIONS: 7 mL 1% lidocaine COMPLICATIONS: None immediate. PROCEDURE: Informed written consent was obtained from the patient following an explanation of the procedure, risks, benefits and alternatives to treatment. A time out was performed prior to the initiation of the procedure. Maximal barrier sterile technique was utilized including caps, mask, sterile gowns, sterile gloves, large sterile drape, hand hygiene, and ChloraPrep. 1% lidocaine was injected under sterile conditions along the subcutaneous tunnel. Utilizing a combination of blunt dissection and gentle traction, the catheter was removed intact. Hemostasis was obtained with manual compression. A dressing was placed. The patient tolerated the procedure well without immediate post procedural complication. IMPRESSION: Successful removal of a RIGHT chest tunneled dialysis catheter. Tip sent for culture per request of ordering provider. Performed by Lynnette Caffey, PA-C Electronically Signed   By:  Roanna Banning M.D.   On: 09/07/2023 12:31    Medications: Infusions:  vancomycin Stopped (09/06/23 1753)    Scheduled Medications:  aspirin EC  81 mg Oral QHS   buPROPion  300 mg Oral QPM   Chlorhexidine Gluconate Cloth  6 each Topical Q0600   Chlorhexidine Gluconate Cloth  6 each Topical Q0600   clopidogrel  75 mg Oral Daily   folic acid  1 mg Oral q AM   heparin  5,000 Units Subcutaneous Q8H   insulin aspart  0-9 Units Subcutaneous Q4H   insulin aspart  2 Units Subcutaneous TID WC   insulin glargine-yfgn  20 Units Subcutaneous BID   lamoTRIgine  200 mg Oral QPM   levothyroxine  175 mcg Oral Q0600   melatonin  9 mg Oral QHS   mupirocin ointment  1 Application Nasal BID   rosuvastatin  10 mg Oral QPM   sevelamer carbonate  1,600 mg Oral TID WC   sodium chloride flush  3 mL Intravenous Q12H    have reviewed scheduled and prn medications.  Physical Exam: General:NAD, comfortable Heart:RRR, s1s2 nl Lungs:clear b/l, no crackle Abdomen:soft, Non-tender, non-distended Extremities:No edema Dialysis Access: No HD line at the moment.  Anvika Gashi Prasad Mcguire Gasparyan 09/08/2023,9:14 AM  LOS: 3 days

## 2023-09-08 NOTE — H&P (Signed)
 Procedure H&P   Cardiology consulted to perform transesophageal echocardiogram in the setting of MRSA bacteremia. Plan to proceed with procedure today at 1pm with the assistance of anesthesiology. For full medical history please refer to referenced hospital admission note below.  Dina Rich MD     History and Physical      Patient: Jasmine [name] GMW:[id number] DOB: 11/15/1970 DOA: 09/05/2023 DOS: the patient was seen and examined on 09/05/2023 PCP: Richmond Campbell., PA-C  Patient coming from: Home   Chief Complaint:     Chief Complaint  Patient presents with   Weakness    HPI: Jasmine [name] is a 53 y.o. female with a history of T1DM on insulin pump, CAD s/p DES x4 on 08/30/2023, ESRD (on HD TTS thru right IJ TDC, OSA on 3L nocturnal O2, obesity, HTN, HLD, bipolar disorder, IBS and recent I&D of right thigh abscess (3/28) who presented by EMD from home with feeling ill, weak, fatigued starting the night before associated with fever of 101.64F and shaking chills. She also has a new cough, though no shortness of breath or chest pain. She also has a headache. No sick contacts. Symptoms have been constant and worsening at home.    In the ED she was febrile with leukocytosis (WBC 14.4k; PMN 13.1k with leftward shift), tachypneic to 24/min, placed on supplemental oxygen (not on this at baseline). HR and BP were stable. Lactic acid normal at 1.2. Initial troponin 273, delta pending, though pt denies chest pain and ECG shows no ST deviations. CXR demonstrated a right hilar opacity.    IV antibiotics were started, blood cultures drawn, and admission requested. The patient is also noted to have mild residual abscess and cellulitis of an area on the right thigh for which EDP is planning repeat I&D.    Review of Systems: As mentioned in the history of present illness. All other systems reviewed and are negative.     Past Medical History:  Diagnosis Date   Anxiety     Arthritis      Asthma     Balance problems     Bipolar disorder (HCC)     Charcot ankle     Chronic fatigue     Chronic kidney disease      STAGE 3-4   Depression     Diabetes mellitus     DKA, type 1 (HCC) 11/04/2011   Elevated cholesterol     Fibromyalgia     GERD (gastroesophageal reflux disease)     Headache     History of suicidal ideation     Hyperlipemia     Hypertension     Hypothyroidism     IBS (irritable bowel syndrome)     Memory changes     Obesity     Sleep apnea      HAS C -PAP / DOES NOT USE   Stress incontinence      Pt had surgery to correct this.   Tachycardia     Tobacco abuse     Tremor     UTI (lower urinary tract infection)               Past Surgical History:  Procedure Laterality Date   CORONARY IMAGING/OCT N/A 08/30/2023    Procedure: CORONARY IMAGING/OCT;  Surgeon: Orbie Pyo, MD;  Location: [facility];  Service: Cardiovascular;  Laterality: N/A;   CORONARY STENT INTERVENTION N/A 08/30/2023    Procedure: CORONARY STENT INTERVENTION;  Surgeon: Alverda Skeans  K, MD;  Location: [facility];  Service: Cardiovascular;  Laterality: N/A;   INCONTINENCE SURGERY       LEFT HEART CATH AND CORONARY ANGIOGRAPHY N/A 07/15/2023    Procedure: LEFT HEART CATH AND CORONARY ANGIOGRAPHY;  Surgeon: Orbie Pyo, MD;  Location: [facility];  Service: Cardiovascular;  Laterality: N/A;   LEFT HEART CATH AND CORONARY ANGIOGRAPHY N/A 08/30/2023    Procedure: LEFT HEART CATH AND CORONARY ANGIOGRAPHY;  Surgeon: Orbie Pyo, MD;  Location: [facility];  Service: Cardiovascular;  Laterality: N/A;   NASAL FRACTURE SURGERY       ovary removed       OVARY SURGERY       PUBOVAGINAL SLING   08/16/2011    Procedure: Leonides Grills;  Surgeon: Valetta Fuller, MD;  Location: [facility];  Service: Urology;  Laterality: N/A;               UTERINE FIBROID SURGERY   2001        Social History:  reports that she quit smoking about 12 years ago. Her  smoking use included cigarettes. She started smoking about 32 years ago. She has a 15 pack-year smoking history. She has never used smokeless tobacco. She reports that she does not drink alcohol and does not use drugs.   Allergies       Allergies  Allergen Reactions   Ciprofloxacin Swelling and Other (See Comments)      Per pt caused lips swell and nauseous feeling   Levaquin [Levofloxacin] Swelling and Other (See Comments)      Per pt caused lips swell and nauseous feeling   Buspar [Buspirone] Other (See Comments)      abd cramping   Promethazine Other (See Comments)      Completely wipes out/fatigue   Linaclotide Other (See Comments)      Cause severe dehydration    Advair Diskus [Fluticasone-Salmeterol] Other (See Comments)      Thrush    Biaxin [Clarithromycin] Rash   Hydroxyzine Palpitations             Family History  Problem Relation Age of Onset   Asthma Mother     Bipolar disorder Mother     Heart disease Father     Lymphoma Father     Hypertension Father     Thyroid disease Father     Hyperlipidemia Father     Diabetes Father     Cancer Paternal Grandmother          lung and breast   Bladder Cancer Paternal Grandfather     Suicidality Maternal Grandfather     Thyroid disease Brother                   Prior to Admission medications   Medication Sig Start Date End Date Taking? Authorizing Provider  acetaminophen (TYLENOL) 500 MG tablet Take 500-1,000 mg by mouth every 6 (six) hours as needed (pain.).       [provider]  albuterol (VENTOLIN HFA) 108 (90 Base) MCG/ACT inhaler Inhale 1-2 puffs into the lungs every 6 (six) hours as needed for wheezing or shortness of breath. 04/07/23     Oretha Milch, MD  Ascorbic Acid (VITAMIN C PO) Take 1 tablet by mouth in the morning.       [provider]  aspirin EC 81 MG tablet Take 81 mg by mouth at bedtime. Swallow whole.  [provider]  azelastine (ASTELIN) 0.1 % nasal spray Place  2 sprays into both nostrils 2 (two) times daily. Use in each nostril as directed Patient taking differently: Place 2 sprays into both nostrils in the morning. Use in each nostril as directed 08/01/23     Glenford Bayley, NP  buPROPion (WELLBUTRIN XL) 300 MG 24 hr tablet Take 1 tablet (300 mg total) by mouth daily. Patient taking differently: Take 300 mg by mouth every evening. 03/23/23 09/19/23   Neysa Hotter, MD  carvedilol (COREG) 12.5 MG tablet Take 12.5 mg by mouth 2 (two) times daily with a meal. 01/17/23 01/17/24   [provider]  Cholecalciferol (VITAMIN D-3 PO) Take 1 tablet by mouth in the morning.       [provider]  clopidogrel (PLAVIX) 75 MG tablet Take 1 tablet (75 mg total) by mouth daily. 02/22/23     Alver Sorrow, NP  Continuous Blood Gluc Sensor MISC 1 each by Does not apply route as directed. Use as directed every 14 days. May dispense FreeStyle [employer] or similar.       [provider]  diclofenac Sodium (VOLTAREN) 1 % GEL Apply 2 g topically 4 (four) times daily as needed (pain.).       [provider]  dicyclomine (BENTYL) 10 MG capsule Take 10 mg by mouth 2 (two) times daily as needed for spasms. 12/25/22     [provider]  DULoxetine (CYMBALTA) 30 MG capsule Take 1 capsule (30 mg total) by mouth daily. 07/03/23 10/31/23   Neysa Hotter, MD  DULoxetine (CYMBALTA) 60 MG capsule Take 60 mg by mouth in the morning.       [provider]  folic acid (FOLVITE) 800 MCG tablet Take 800 mcg by mouth in the morning.       [provider]  glucagon (GLUCAGEN HYPOKIT) 1 MG SOLR injection Inject 1 mg into the skin once as needed for up to 1 dose for low blood sugar. GlucaGen HypoKit 1 mg Injection 03/31/23     Johnson, Clanford L, MD  hydrALAZINE (APRESOLINE) 25 MG tablet Take 25 mg by mouth 2 (two) times daily. 05/03/23     [provider]  insulin detemir (LEVEMIR) 100 UNIT/ML injection Inject 27  Units into the skin 2 (two) times daily as needed (if insulin pump inoperable). 04/04/23     [provider]  lamoTRIgine (LAMICTAL) 200 MG tablet Take 1 tablet (200 mg total) by mouth daily. Patient taking differently: Take 200 mg by mouth every evening. 04/24/23 10/21/23   Neysa Hotter, MD  levothyroxine (SYNTHROID) 175 MCG tablet Take 1.5 tablets (mcg) by mouth on Sundays in the mornings & take 1 tablet (175 mcg) by mouth on all other days. 11/20/21     [provider]  LORazepam (ATIVAN) 0.5 MG tablet Take 1 tablet (0.5 mg total) by mouth daily as needed for anxiety. Patient taking differently: Take 0.5 mg by mouth daily as needed for anxiety or sleep. 07/04/23 10/02/23   Neysa Hotter, MD  Melatonin 10 MG TABS Take 10 mg by mouth at bedtime.       [provider]  NOVOLOG 100 UNIT/ML injection Inject into the skin continuous. Sliding Scale Insulin pump 2.5 units basal Bolus with meal depending on the size 03/15/22     [provider]  ondansetron (ZOFRAN-ODT) 4 MG disintegrating tablet Dissolve 1 tablet (4 mg total) by mouth every 8 (eight) hours as needed.  03/17/22     [provider]  OVER THE COUNTER MEDICATION Apply 1 application  topically at bedtime as needed (Sleep). CBD oil       [provider]  oxyCODONE-acetaminophen (PERCOCET/ROXICET) 5-325 MG tablet Take 1 tablet by mouth daily as needed (pain.).       [provider]  OXYGEN Inhale 3 L into the lungs at bedtime.       [provider]  rizatriptan (MAXALT) 10 MG tablet Take 10 mg by mouth as needed for migraine. May repeat in 2 hours if needed       [provider]  rosuvastatin (CRESTOR) 10 MG tablet Take 1 tablet (10 mg total) by mouth daily. Patient taking differently: Take 10 mg by mouth every evening. 07/29/23 10/27/23   Jodelle Red, MD  sevelamer carbonate (RENVELA) 800 MG tablet Take 1,600 mg by mouth 3 (three) times daily with meals.  07/28/23     [provider]  torsemide (DEMADEX) 20 MG tablet TAKE TWO TABLETS BY MOUTH AS DIRECTED ON non dialysis DAYS       [provider]      Physical Exam:       Vitals:    09/05/23 1145 09/05/23 1200 09/05/23 1350 09/05/23 1407  BP: (!) 112/59 (!) 106/56 (!) 75/36 (!) 110/53  Pulse: 86 84 71 70  Resp:     18 19  Temp:     98.2 F (36.8 C)    TempSrc:     Oral    SpO2: 90% 97% 95% 96%  Weight:          Height:          Gen: No distress, resting quietly Pulm: No wheezing or crackles, SpO2 low 90%'s on 4L O2, still tachypneic with normal respiratory effort and intermittent dry sounding cough.   CV: RRR, no MRG, no pitting edema or JVD. GI: Soft, NT, ND, +BS Neuro: Alert and oriented. No new focal deficits. Ext: Warm, no deformities.  Skin: Right TDC site is nontender without discharge, bleeding or erythema. Right medial thigh has ~5cm area of tender induration and central punctum with slough and partial eschar, mild fluctuance, with surrounding erythema. Right radial site is healed.    Data Reviewed: WBC 14.4k, hgb 10.8g/dl, plt 981X Troponin 914 > 626 LA 1.2 Na 133, K 3.8, bicarb 23, BUN 34, Cr 5.55 AST 73 Albumin 2.9 Flu, RSV, covid-19 PCR: Negative CXR: Right hilar fullness/opacity.  ECG: Initial w/NSR, submillimeter ST depressions particularly in middle precordial leads, stable TWI in aVF. Repeat: No ST segment deviations.    Assessment and Plan: Sepsis due to pneumonia: Fever, leukocytosis, pulmonary opacity, cough, hypoxemia.  - Continue Doxycycline, ceftriaxone  - Send urine strep and legionella Ag if urine is produced - Send sputum culture if specimen can be expectorated - Monitor blood cultures - Re: alternative sources, abscess/cellulitis does not currently appear significant enough to cause sepsis but will be watched closely. No other localizing symptoms/signs, TDC looks good.  - Tylenol prn   CAD s/p DES x4 08/30/2023 with troponin  elevation, HTN, HLD:  - Repeat ECG > personally interpreted, the ST changes in initial ECG are resolved, no ischemic features, though troponin rising, so will trend and consult cardiology. No angina. - Continue DAPT - Continue statin - Hold BB, hydralazine given softening BP. Will admit to SDU, consider small volume fluid if hypotensive.  - Note history of migraine, pt specifically says she hasn't taken maxalt. This  should be discontinued in light of her ischemic coronary artery disease.  - Jari Favre, PA follow up 4/9 at 10:55am   Acute on chronic hypoxic respiratory failure (nocturnal O2 chronically):  - Continue supplemental oxygen to maintain normal WOB and SpO2 >92%.  - Abx as above.   ESRD: Had normal HD Saturday on schedule, no urgent needs.  - Nephrology notified of admission, will need routine HD TTS while admitted. Mention of opacity possibly being pulmonary edema, though does not appear overloaded, will monitor.  - Hold non-HD days torsemide with sepsis as above   Right thigh abscess:  - Repeat I&D, hopefully can send culture, in ED per EDP.  - Will follow up culture results from urgent care I&D 3/28 (none available in care everywhere at this time).    T1DM: Last HbA1c < 6%.  - Pt able to continue insulin pump and monitor CBGs. We will check CBGs regularly   Bipolar disorder: Quiescent.  - Continue home medications including lamictal, wellbutrin, cymbalta, prn ativan   Hypothyroidism:  - Continue home custom synthroid dosing.        Advance Care Planning: Full code   Consults: Cardiology, nephrology   Family Communication: None at bedside   Severity of Illness: The appropriate patient status for this patient is INPATIENT. Inpatient status is judged to be reasonable and necessary in order to provide the required intensity of service to ensure the patient's safety. The patient's presenting symptoms, physical exam findings, and initial radiographic and laboratory data in  the context of their chronic comorbidities is felt to place them at high risk for further clinical deterioration. Furthermore, it is not anticipated that the patient will be medically stable for discharge from the hospital within 2 midnights of admission.    * I certify that at the point of admission it is my clinical judgment that the patient will require inpatient hospital care spanning beyond 2 midnights from the point of admission due to high intensity of service, high risk for further deterioration and high frequency of surveillance required.*   Author: Tyrone Nine, MD 09/05/2023 2:47 PM

## 2023-09-08 NOTE — Anesthesia Postprocedure Evaluation (Signed)
 Anesthesia Post Note  Patient: Jasmine [name]  Procedure(s) Performed: ECHOCARDIOGRAM, TRANSESOPHAGEAL  Patient location during evaluation: PACU Anesthesia Type: General Level of consciousness: awake and alert Pain management: pain level controlled Vital Signs Assessment: post-procedure vital signs reviewed and stable Respiratory status: spontaneous breathing, nonlabored ventilation, respiratory function stable and patient connected to nasal cannula oxygen Cardiovascular status: blood pressure returned to baseline and stable Postop Assessment: no apparent nausea or vomiting Anesthetic complications: no   There were no known notable events for this encounter.   Last Vitals:  Vitals:   09/08/23 1415 09/08/23 1430  BP: (!) 164/77 (!) 168/89  Pulse: 67 71  Resp: (!) 24 20  Temp:    SpO2: 97% 97%    Last Pain:  Vitals:   09/08/23 1430  TempSrc:   PainSc: 0-No pain                 Eleanora Guinyard L Janeese Mcgloin

## 2023-09-08 NOTE — Anesthesia Procedure Notes (Addendum)
 Date/Time: 09/08/2023 1:21 PM  Performed by: Shanon Payor, CRNAPre-anesthesia Checklist: Patient identified, Emergency Drugs available, Suction available, Patient being monitored and Timeout performed Patient Re-evaluated:Patient Re-evaluated prior to induction Oxygen Delivery Method: Nasal cannula Induction Type: IV induction

## 2023-09-08 NOTE — TOC Progression Note (Signed)
 Transition of Care [facility]) - Progression Note    Patient Details  Name: Jasmine [name] MRN: [id number] Date of Birth: 1971/01/12  [facility]) CM/SW Contact  Villa Herb, [place] Phone Number: 09/08/2023, 10:06 AM  Clinical Narrative:    CSW updated that pt will likely need IV antibiotics with HD at D/C. CSW spoke to Joy with [facility] who states they have that medication in the clinic and will be able to do this. They will need documentation that states dosage and how long of need. TOC to follow.   Expected Discharge Plan: Home/Self Care Barriers to Discharge: Continued Medical Work up  Expected Discharge Plan and Services In-house Referral: Clinical Social Work     Living arrangements for the past 2 months: Single Family Home                                       Social Determinants of Health (SDOH) Interventions SDOH Screenings   Food Insecurity: No Food Insecurity (09/05/2023)  Housing: Low Risk  (09/05/2023)  Transportation Needs: No Transportation Needs (09/05/2023)  Utilities: Not At Risk (09/05/2023)  Alcohol Screen: Low Risk  (08/30/2018)  Depression (PHQ2-9): High Risk (12/14/2021)  Financial Resource Strain: Low Risk  (12/22/2022)   Received from [facility]  Physical Activity: Inactive (01/23/2019)  Social Connections: Unknown (10/19/2021)   Received from [facility], [facility]  Stress: Stress Concern Present (01/23/2019)  Tobacco Use: Medium Risk (09/05/2023)    Readmission Risk Interventions    09/06/2023    8:18 AM  Readmission Risk Prevention Plan  Transportation Screening Complete  HRI or Home Care Consult Complete  Social Work Consult for Recovery Care Planning/Counseling Complete  Palliative Care Screening Not Applicable  Medication Review Oceanographer) Complete

## 2023-09-08 NOTE — Plan of Care (Signed)
  Problem: Education: Goal: Knowledge of General Education information will improve Description: Including pain rating scale, medication(s)/side effects and non-pharmacologic comfort measures Outcome: Progressing   Problem: Health Behavior/Discharge Planning: Goal: Ability to manage health-related needs will improve Outcome: Progressing   Problem: Clinical Measurements: Goal: Ability to maintain clinical measurements within normal limits will improve Outcome: Progressing Goal: Will remain free from infection Outcome: Progressing Goal: Diagnostic test results will improve Outcome: Progressing Goal: Respiratory complications will improve Outcome: Progressing Goal: Cardiovascular complication will be avoided Outcome: Progressing   Problem: Activity: Goal: Risk for activity intolerance will decrease Outcome: Progressing   Problem: Nutrition: Goal: Adequate nutrition will be maintained Outcome: Progressing   Problem: Coping: Goal: Level of anxiety will decrease Outcome: Progressing   Problem: Pain Managment: Goal: General experience of comfort will improve and/or be controlled Outcome: Progressing   Problem: Skin Integrity: Goal: Risk for impaired skin integrity will decrease Outcome: Progressing   Problem: Activity: Goal: Ability to tolerate increased activity will improve Outcome: Progressing   Problem: Clinical Measurements: Goal: Ability to maintain a body temperature in the normal range will improve Outcome: Progressing   Problem: Respiratory: Goal: Ability to maintain adequate ventilation will improve Outcome: Progressing

## 2023-09-08 NOTE — Progress Notes (Signed)
 Patient had cbg of 66.  Hypoglycemic protocol initiated.  Patient NPO for procedure later in day.  Patient alert and responsive. Will recheck CBG.

## 2023-09-08 NOTE — Progress Notes (Signed)
*  PRELIMINARY RESULTS* Echocardiogram Echocardiogram Transesophageal has been performed.  Stacey Drain 09/08/2023, 3:10 PM

## 2023-09-08 NOTE — Transfer of Care (Signed)
 Immediate Anesthesia Transfer of Care Note  Patient: Jasmine [name]  Procedure(s) Performed: ECHOCARDIOGRAM, TRANSESOPHAGEAL  Patient Location: PACU  Anesthesia Type:General  Level of Consciousness: drowsy  Airway & Oxygen Therapy: Patient Spontanous Breathing and Patient connected to nasal cannula oxygen  Post-op Assessment: Report given to RN and Post -op Vital signs reviewed and stable  Post vital signs: Reviewed and stable  Last Vitals:  Vitals Value Taken Time  BP 147/67   Temp 98.6   Pulse 66   Resp 20   SpO2 91%     Last Pain:  Vitals:   09/08/23 1256  TempSrc: Oral  PainSc: 0-No pain         Complications: No notable events documented.

## 2023-09-09 ENCOUNTER — Encounter (HOSPITAL_COMMUNITY): Payer: Self-pay | Admitting: Cardiology

## 2023-09-09 DIAGNOSIS — N186 End stage renal disease: Secondary | ICD-10-CM | POA: Diagnosis not present

## 2023-09-09 DIAGNOSIS — I10 Essential (primary) hypertension: Secondary | ICD-10-CM | POA: Diagnosis not present

## 2023-09-09 DIAGNOSIS — R6520 Severe sepsis without septic shock: Secondary | ICD-10-CM | POA: Diagnosis not present

## 2023-09-09 DIAGNOSIS — A4102 Sepsis due to Methicillin resistant Staphylococcus aureus: Secondary | ICD-10-CM | POA: Diagnosis not present

## 2023-09-09 DIAGNOSIS — J9601 Acute respiratory failure with hypoxia: Secondary | ICD-10-CM | POA: Diagnosis not present

## 2023-09-09 DIAGNOSIS — F3181 Bipolar II disorder: Secondary | ICD-10-CM | POA: Diagnosis not present

## 2023-09-09 LAB — COMPREHENSIVE METABOLIC PANEL WITH GFR
ALT: 18 U/L (ref 0–44)
AST: 12 U/L — ABNORMAL LOW (ref 15–41)
Albumin: 2.4 g/dL — ABNORMAL LOW (ref 3.5–5.0)
Alkaline Phosphatase: 88 U/L (ref 38–126)
Anion gap: 9 (ref 5–15)
BUN: 37 mg/dL — ABNORMAL HIGH (ref 6–20)
CO2: 27 mmol/L (ref 22–32)
Calcium: 9 mg/dL (ref 8.9–10.3)
Chloride: 99 mmol/L (ref 98–111)
Creatinine, Ser: 5.16 mg/dL — ABNORMAL HIGH (ref 0.44–1.00)
GFR, Estimated: 10 mL/min — ABNORMAL LOW (ref >60)
Glucose, Bld: 119 mg/dL — ABNORMAL HIGH (ref 70–99)
Potassium: 3.4 mmol/L — ABNORMAL LOW (ref 3.5–5.1)
Sodium: 135 mmol/L (ref 135–145)
Total Bilirubin: 0.4 mg/dL (ref 0.0–1.2)
Total Protein: 6.2 g/dL — ABNORMAL LOW (ref 6.5–8.1)

## 2023-09-09 LAB — CBC
HCT: 32.2 % — ABNORMAL LOW (ref 36.0–46.0)
Hemoglobin: 10.1 g/dL — ABNORMAL LOW (ref 12.0–15.0)
MCH: 29.7 pg (ref 26.0–34.0)
MCHC: 31.4 g/dL (ref 30.0–36.0)
MCV: 94.7 fL (ref 80.0–100.0)
Platelets: 247 10*3/uL (ref 150–400)
RBC: 3.4 MIL/uL — ABNORMAL LOW (ref 3.87–5.11)
RDW: 12.8 % (ref 11.5–15.5)
WBC: 7.4 10*3/uL (ref 4.0–10.5)
nRBC: 0 % (ref 0.0–0.2)

## 2023-09-09 LAB — GLUCOSE, CAPILLARY
Glucose-Capillary: 132 mg/dL — ABNORMAL HIGH (ref 70–99)
Glucose-Capillary: 106 mg/dL — ABNORMAL HIGH (ref 70–99)
Glucose-Capillary: 244 mg/dL — ABNORMAL HIGH (ref 70–99)
Glucose-Capillary: 293 mg/dL — ABNORMAL HIGH (ref 70–99)
Glucose-Capillary: 273 mg/dL — ABNORMAL HIGH (ref 70–99)

## 2023-09-09 MED ORDER — ASPIRIN 81 MG PO TBEC: 81.0000 mg | DELAYED_RELEASE_TABLET | Freq: Every day | ORAL | 12 refills | Status: DC

## 2023-09-09 MED ORDER — CHLORHEXIDINE GLUCONATE CLOTH 2 % EX PADS
6.0000 | MEDICATED_PAD | Freq: Every day | CUTANEOUS | Status: DC
  Administered 2023-09-09 – 2023-09-11 (×3): 6 via TOPICAL

## 2023-09-09 MED ORDER — POTASSIUM CHLORIDE CRYS ER 20 MEQ PO TBCR
40.0000 meq | EXTENDED_RELEASE_TABLET | Freq: Once | ORAL | Status: AC
Start: 2023-09-09 — End: 2023-09-09
  Administered 2023-09-09: 40 meq via ORAL
  Filled 2023-09-09: qty 2

## 2023-09-09 MED ORDER — CEFAZOLIN SODIUM-DEXTROSE 2-4 GM/100ML-% IV SOLN: 2.0000 g | INTRAVENOUS | Status: DC

## 2023-09-09 MED ORDER — INSULIN GLARGINE-YFGN 100 UNIT/ML ~~LOC~~ SOLN
10.0000 [IU] | Freq: Every day | SUBCUTANEOUS | Status: DC
  Administered 2023-09-10 (×2): 10 [IU] via SUBCUTANEOUS
  Filled 2023-09-09 (×4): qty 0.1

## 2023-09-09 NOTE — TOC Progression Note (Signed)
 Transition of Care [facility]) - Progression Note    Patient Details  Name: Jasmine [name] MRN: [id number] Date of Birth: Mar 10, 1971  [facility] of [facility]) CM/SW Contact  Elliot Gault, LCSW Phone Number: 09/09/2023, 10:46 AM  Clinical Narrative:     TOC following. Updated [facility] on pt's dc plan for tomorrow with return to clinic Tuesday next week. Faxed Pharmacy note about plan for the anbx to be given with HD until 4/29.   Weekend TOC will follow.  Expected Discharge Plan: Home/Self Care Barriers to Discharge: Continued Medical Work up  Expected Discharge Plan and Services In-house Referral: Clinical Social Work     Living arrangements for the past 2 months: Single Family Home                                       Social Determinants of Health (SDOH) Interventions SDOH Screenings   Food Insecurity: No Food Insecurity (09/05/2023)  Housing: Low Risk  (09/05/2023)  Transportation Needs: No Transportation Needs (09/05/2023)  Utilities: Not At Risk (09/05/2023)  Alcohol Screen: Low Risk  (08/30/2018)  Depression (PHQ2-9): High Risk (12/14/2021)  Financial Resource Strain: Low Risk  (12/22/2022)   Received from [facility]  Physical Activity: Inactive (01/23/2019)  Social Connections: Unknown (10/19/2021)   Received from [facility], [facility]  Stress: Stress Concern Present (01/23/2019)  Tobacco Use: Medium Risk (09/08/2023)    Readmission Risk Interventions    09/06/2023    8:18 AM  Readmission Risk Prevention Plan  Transportation Screening Complete  HRI or Home Care Consult Complete  Social Work Consult for Recovery Care Planning/Counseling Complete  Palliative Care Screening Not Applicable  Medication Review Oceanographer) Complete

## 2023-09-09 NOTE — Progress Notes (Addendum)
 [facility] NEPHROLOGY PROGRESS NOTE  Assessment/ Plan: Pt is a 53 y.o. yo female   Dialysis Orders: Center: [facility]  on TTS . EDW 117.5kg HD Bath 3K/2.5Ca  Time 4:00 Heparin 2500. Access TDC BFR 400 DFR 500    Micera 75 mcg IV/HD every 4 weeks (due 09/06/23) Venofer  50 mg IV every week  # Sepsis - MRSA in blood and right leg abscess.  On Iv Vancomycin. ID recommends line holiday.   -TDC removed on 4/2.  -Plan for temporary HD catheter placement by surgeon Dr.Pappayliou tomorrow followed by dialysis.  The blood culture has been negative so far.  IR was consulted to place tunneled HD catheter presumably next week.   # ESRD -last HD on 4/1.  IR removed HD catheter on 4/2.  Plan for next dialysis tomorrow after placing new HD catheter.    # Hypertension/volume  - stable and will UF as tolerated to edw as bp tolerates.   # Anemia  -  hgb at goal.  Continue erythropoietin.  Transfuse for Hgb <7, hold off on IV iron given bacteremia.  # Metabolic bone disease -  continue with home meds  # Acute on chronic hypoxic respiratory failure -UF with the HD and may need to lower dry weight.  # Hypokalemia: Repleted KCl.  Adjust dialysis potassium bath.  Discussed with surgery, dialysis nurse, primary team. Please contact on-call nephrologist over the weekend if you have any question.  Subjective: Seen and examined at bedside.  Some stomach upset after taking potassium pills.  Denies nausea, vomiting, chest pain, shortness of breath.  Cultures negative so far.    Objective Vital signs in last 24 hours: Vitals:   09/08/23 1430 09/08/23 1510 09/08/23 1957 09/09/23 0339  BP: (!) 168/89 (!) 172/80 (!) 158/64 (!) 148/61  Pulse: 71 67 72 67  Resp: 20 20 20 20   Temp:  99.3 F (37.4 C) 99.2 F (37.3 C) 98.1 F (36.7 C)  TempSrc:  Oral Oral Oral  SpO2: 97% 92% 94% 99%  Weight:      Height:       Weight change:   Intake/Output Summary (Last 24 hours) at 09/09/2023 1055 Last  data filed at 09/09/2023 1025 Gross per 24 hour  Intake 900 ml  Output --  Net 900 ml       Labs: RENAL PANEL Recent Labs  Lab 09/05/23 1057 09/06/23 0512 09/07/23 0431 09/09/23 0430  NA 133* 131* 133* 135  K 3.8 4.1 3.7 3.4*  CL 97* 96* 94* 99  CO2 23 25 28 27   GLUCOSE 155* 114* 106* 119*  BUN 34* 48* 28* 37*  CREATININE 5.55* 6.35* 3.98* 5.16*  CALCIUM 9.3 9.3 8.9 9.0  ALBUMIN 2.9*  --   --  2.4*    Liver Function Tests: Recent Labs  Lab 09/05/23 1057 09/09/23 0430  AST 73* 12*  ALT 38 18  ALKPHOS 118 88  BILITOT 0.6 0.4  PROT 6.4* 6.2*  ALBUMIN 2.9* 2.4*   No results for input(s): "LIPASE", "AMYLASE" in the last 168 hours. No results for input(s): "AMMONIA" in the last 168 hours. CBC: Recent Labs    03/29/23 1030 03/30/23 0511 08/30/23 1050 09/05/23 1057 09/06/23 0512 09/07/23 0431 09/09/23 0430  HGB  --    < > 12.6 10.8* 9.4* 9.7* 10.1*  MCV  --    < >  --  95.2 97.1 95.9 94.7  VITAMINB12 362  --   --   --   --   --   --  FOLATE 19.4  --   --   --   --   --   --    < > = values in this interval not displayed.    Cardiac Enzymes: No results for input(s): "CKTOTAL", "CKMB", "CKMBINDEX", "TROPONINI" in the last 168 hours. CBG: Recent Labs  Lab 09/08/23 1647 09/08/23 1955 09/08/23 2350 09/09/23 0345 09/09/23 0753  GLUCAP 177* 376* 244* 132* 106*    Iron Studies: No results for input(s): "IRON", "TIBC", "TRANSFERRIN", "FERRITIN" in the last 72 hours. Studies/Results: ECHO TEE Result Date: 09/08/2023    TRANSESOPHOGEAL ECHO REPORT   Patient Name:   JULIE NAY Date of Exam: 09/08/2023 Medical Rec #:  [id number]         Height:       69.0 in Accession #:    [id number]        Weight:       261.7 lb Date of Birth:  25-Jan-1971         BSA:          2.316 m Patient Age:    52 years          BP:           182/74 mmHg Patient Gender: F                 HR:           72 bpm. [facility] Procedure: Transesophageal Echo, Cardiac Doppler  and Color Doppler (Both            Spectral and Color Flow Doppler were utilized during procedure). Indications:    Bacteremia R78.81  History:        Patient has prior history of Echocardiogram examinations, most                 recent 09/07/2023. CAD; Risk Factors:Hypertension, Diabetes,                 Dyslipidemia and Former Smoker. ESRD on dailysis.  Sonographer:    Celesta Gentile RCS Referring Phys: [id number] Basilio Cairo PROCEDURE: The transesophogeal probe was passed without difficulty through the esophogus of the patient. Sedation performed by different physician. Image quality was excellent. The patient developed no complications during the procedure.  IMPRESSIONS  1. Left ventricular ejection fraction, by estimation, is 60 to 65%. The left ventricle has normal function.  2. Right ventricular systolic function is normal. The right ventricular size is normal.  3. Left atrial size was mildly dilated. No left atrial/left atrial appendage thrombus was detected. The LAA emptying velocity was 60 cm/s.  4. The mitral valve is normal in structure. Trivial mitral valve regurgitation. No evidence of mitral stenosis.  5. The aortic valve is tricuspid. Aortic valve regurgitation is not visualized. No aortic stenosis is present. Conclusion(s)/Recommendation(s): No evidence of vegetation/infective endocarditis on this transesophageael echocardiogram. FINDINGS  Left Ventricle: Left ventricular ejection fraction, by estimation, is 60 to 65%. The left ventricle has normal function. The left ventricular internal cavity size was normal in size. Right Ventricle: The right ventricular size is normal. No increase in right ventricular wall thickness. Right ventricular systolic function is normal. Left Atrium: Left atrial size was mildly dilated. No left atrial/left atrial appendage thrombus was detected. The LAA emptying velocity was 60 cm/s. Right Atrium: Right atrial size was normal in size. Pericardium: There is no evidence  of pericardial effusion. Mitral Valve: The mitral valve is normal in structure. Trivial mitral valve regurgitation.  No evidence of mitral valve stenosis. Tricuspid Valve: The tricuspid valve is normal in structure. Tricuspid valve regurgitation is mild . No evidence of tricuspid stenosis. Aortic Valve: The aortic valve is tricuspid. Aortic valve regurgitation is not visualized. No aortic stenosis is present. Pulmonic Valve: The pulmonic valve was normal in structure. Pulmonic valve regurgitation is not visualized. No evidence of pulmonic stenosis. Aorta: The aortic root is normal in size and structure. IAS/Shunts: No atrial level shunt detected by color flow Doppler.   AORTA Ao Root diam: 2.90 cm Dina Rich MD Electronically signed by Dina Rich MD Signature Date/Time: 09/08/2023/3:25:16 PM    Final    ECHOCARDIOGRAM COMPLETE Result Date: 09/07/2023    ECHOCARDIOGRAM REPORT   Patient Name:   TEALE GOODGAME Date of Exam: 09/07/2023 Medical Rec #:  [id number]         Height:       69.0 in Accession #:    [id number]        Weight:       261.7 lb Date of Birth:  1970/08/07         BSA:          2.316 m Patient Age:    52 years          BP:           108/42 mmHg Patient Gender: F                 HR:           77 bpm. [facility] Procedure: 2D Echo, Cardiac Doppler, Color Doppler and Intracardiac            Opacification Agent (Both Spectral and Color Flow Doppler were            utilized during procedure). Indications:    Bacteremia R78.81  History:        Patient has prior history of Echocardiogram examinations, most                 recent 07/15/2023. CAD; Risk Factors:Hypertension, Diabetes,                 Dyslipidemia and Former Smoker. ESRD on dailysis.  Sonographer:    Celesta Gentile RCS Referring Phys: [phone] Tyrone Nine IMPRESSIONS  1. Left ventricular ejection fraction, by estimation, is 55 to 60%. The left ventricle has normal function. The left ventricle has no regional wall motion  abnormalities. Left ventricular diastolic parameters were normal.  2. Right ventricular systolic function is normal. The right ventricular size is normal.  3. The mitral valve is normal in structure. Trivial mitral valve regurgitation. No evidence of mitral stenosis.  4. The tricuspid valve is abnormal.  5. The aortic valve has an indeterminant number of cusps. Aortic valve regurgitation is not visualized. No aortic stenosis is present.  6. The inferior vena cava is normal in size with greater than 50% respiratory variability, suggesting right atrial pressure of 3 mmHg. FINDINGS  Left Ventricle: Left ventricular ejection fraction, by estimation, is 55 to 60%. The left ventricle has normal function. The left ventricle has no regional wall motion abnormalities. Definity contrast agent was given IV to delineate the left ventricular  endocardial borders. The left ventricular internal cavity size was normal in size. There is no left ventricular hypertrophy. Left ventricular diastolic parameters were normal. Right Ventricle: The right ventricular size is normal. Right vetricular wall thickness was not well visualized. Right ventricular systolic function is  normal. Left Atrium: Left atrial size was normal in size. Right Atrium: Right atrial size was normal in size. Pericardium: There is no evidence of pericardial effusion. Mitral Valve: The mitral valve is normal in structure. There is mild thickening of the mitral valve leaflet(s). There is mild calcification of the mitral valve leaflet(s). Mild mitral annular calcification. Trivial mitral valve regurgitation. No evidence  of mitral valve stenosis. Tricuspid Valve: The tricuspid valve is abnormal. Tricuspid valve regurgitation is mild . No evidence of tricuspid stenosis. Aortic Valve: The aortic valve has an indeterminant number of cusps. Aortic valve regurgitation is not visualized. No aortic stenosis is present. Aortic valve mean gradient measures 7.0 mmHg. Aortic valve  peak gradient measures 13.2 mmHg. Aortic valve area, by VTI measures 2.61 cm. Pulmonic Valve: The pulmonic valve was not well visualized. Pulmonic valve regurgitation is not visualized. No evidence of pulmonic stenosis. Aorta: The aortic root is normal in size and structure and the ascending aorta was not well visualized. Venous: The inferior vena cava is normal in size with greater than 50% respiratory variability, suggesting right atrial pressure of 3 mmHg. IAS/Shunts: No atrial level shunt detected by color flow Doppler.  LEFT VENTRICLE PLAX 2D LVIDd:         5.10 cm   Diastology LVIDs:         2.80 cm   LV e' medial:    7.29 cm/s LV PW:         1.00 cm   LV E/e' medial:  19.5 LV IVS:        1.00 cm   LV e' lateral:   12.30 cm/s LVOT diam:     2.10 cm   LV E/e' lateral: 11.5 LV SV:         112 LV SV Index:   48 LVOT Area:     3.46 cm  RIGHT VENTRICLE RV S prime:     15.20 cm/s TAPSE (M-mode): 2.0 cm LEFT ATRIUM             Index        RIGHT ATRIUM           Index LA diam:        4.70 cm 2.03 cm/m   RA Area:     12.10 cm LA Vol (A2C):   71.3 ml 30.79 ml/m  RA Volume:   27.00 ml  11.66 ml/m LA Vol (A4C):   71.2 ml 30.74 ml/m LA Biplane Vol: 72.6 ml 31.35 ml/m  AORTIC VALVE AV Area (Vmax):    2.71 cm AV Area (Vmean):   2.57 cm AV Area (VTI):     2.61 cm AV Vmax:           182.00 cm/s AV Vmean:          125.000 cm/s AV VTI:            0.429 m AV Peak Grad:      13.2 mmHg AV Mean Grad:      7.0 mmHg LVOT Vmax:         142.50 cm/s LVOT Vmean:        92.800 cm/s LVOT VTI:          0.324 m LVOT/AV VTI ratio: 0.75  AORTA Ao Root diam: 3.30 cm MITRAL VALVE                TRICUSPID VALVE MV Area (PHT): 3.12 cm     TR Peak grad:   26.0 mmHg  MV Decel Time: 243 msec     TR Vmax:        255.00 cm/s MV E velocity: 142.00 cm/s MV A velocity: 115.00 cm/s  SHUNTS MV E/A ratio:  1.23         Systemic VTI:  0.32 m                             Systemic Diam: 2.10 cm Dina Rich MD Electronically signed by Dina Rich MD Signature Date/Time: 09/07/2023/3:34:00 PM    Final    DG Chest Fluoro Result Date: 09/07/2023 INDICATION: Patient with history of ESRD on HD via right chest HD catheter placed August 2024 at outside hospital, currently admitted for sepsis with concern for HD catheter as source. Request for removal of tunneled HD catheter for line holiday. EXAM: REMOVAL OF TUNNELED HEMODIALYSIS CATHETER WITHOUT IMAGE GUIDANCE MEDICATIONS: 7 mL 1% lidocaine COMPLICATIONS: None immediate. PROCEDURE: Informed written consent was obtained from the patient following an explanation of the procedure, risks, benefits and alternatives to treatment. A time out was performed prior to the initiation of the procedure. Maximal barrier sterile technique was utilized including caps, mask, sterile gowns, sterile gloves, large sterile drape, hand hygiene, and ChloraPrep. 1% lidocaine was injected under sterile conditions along the subcutaneous tunnel. Utilizing a combination of blunt dissection and gentle traction, the catheter was removed intact. Hemostasis was obtained with manual compression. A dressing was placed. The patient tolerated the procedure well without immediate post procedural complication. IMPRESSION: Successful removal of a RIGHT chest tunneled dialysis catheter. Tip sent for culture per request of ordering provider. Performed by Lynnette Caffey, PA-C Electronically Signed   By: Roanna Banning M.D.   On: 09/07/2023 12:31    Medications: Infusions:  [START ON 09/10/2023] vancomycin      Scheduled Medications:  aspirin EC  81 mg Oral QHS   buPROPion  300 mg Oral QPM   Chlorhexidine Gluconate Cloth  6 each Topical Q0600   Chlorhexidine Gluconate Cloth  6 each Topical Q0600   clopidogrel  75 mg Oral Daily   darbepoetin (ARANESP) injection - DIALYSIS  60 mcg Subcutaneous Q Fri-1800   folic acid  1 mg Oral q AM   heparin  5,000 Units Subcutaneous Q8H   insulin aspart  0-9 Units Subcutaneous Q4H   insulin aspart  2  Units Subcutaneous TID WC   insulin glargine-yfgn  20 Units Subcutaneous BID   lamoTRIgine  200 mg Oral QPM   levothyroxine  175 mcg Oral Q0600   melatonin  9 mg Oral QHS   midazolam  2 mg Intravenous Once   mupirocin ointment  1 Application Nasal BID   rosuvastatin  10 mg Oral QPM   sevelamer carbonate  1,600 mg Oral TID WC   sodium chloride flush  3 mL Intravenous Q12H    have reviewed scheduled and prn medications.  Physical Exam: General:NAD, comfortable, able to lie flat. Heart:RRR, s1s2 nl Lungs:clear b/l, no crackle Abdomen:soft, Non-tender, non-distended Extremities:No edema Dialysis Access: No HD line at the moment.  Sharyn Brilliant Prasad Sidharth Leverette 09/09/2023,10:55 AM  LOS: 4 days

## 2023-09-09 NOTE — Progress Notes (Signed)
 Diagnosis: Mrsa bacteremia from hd catheter  Catheter removed 4/2 (tip mrsa) Bcx 4/2 ngtd Bcx 4/1 ngtd Bcx 3/31 mrsa  Thigh abscess and cx mrsa 3/31  4/3 tee no sign of endocarditis   Recommend 4 weeks abx for community acquired mrsa bacteremia/esrd   He has had negative bcx since 4/1. Although the line was removed 4/2 and grown mrsa from tip, I think it is reasonable to try placing the new hd cath this late afternoon if no other option for the weekend   OPAT Orders (patient to get abx with HD center and no opat arrangement needed)  Discharge antibiotics: Vancomycin to be given thrice weekly after dialysis days  Per pharmacy protocol/hd center Aim for Vancomycin trough 15-20 or AUC 400-550 (unless otherwise indicated)  Duration: 4 weeks from 4/2  End Date: 4/30  [facility] Care Per Protocol:  Home health RN for IV administration and teaching; PICC line care and labs.    Labs weekly while on IV antibiotics: _c_ CBC with differential __ BMP _x_ CMP __ CRP __ ESR __ Vancomycin trough __ CK   Fax weekly labs to [phone]  Clinic Follow Up Appt: 5/6 @ 230  @  [facility] [address], [place], [place] [address] Phone: [phone]  --------- Subjective: Jasmine [name] negative No other issue Pending placement of new hd catheter Repeat bcx 4/1 and 4/2 so far ngtd Hd line removed 4/2   Objectives: Vitals:   09/08/23 1957 09/09/23 0339  BP: (!) 158/64 (!) 148/61  Pulse: 72 67  Resp: 20 20  Temp: 99.2 F (37.3 C) 98.1 F (36.7 C)  SpO2: 94% 99%   Exam reviewed  Labs: Lab Results  Component Value Date   WBC 7.4 09/09/2023   HGB 10.1 (L) 09/09/2023   HCT 32.2 (L) 09/09/2023   MCV 94.7 09/09/2023   PLT 247 09/09/2023   Last metabolic panel Lab Results  Component Value Date   GLUCOSE 119 (H) 09/09/2023   NA 135 09/09/2023   K 3.4 (L) 09/09/2023   CL 99 09/09/2023   CO2 27 09/09/2023   BUN 37 (H) 09/09/2023   CREATININE 5.16 (H) 09/09/2023    GFRNONAA 10 (L) 09/09/2023   CALCIUM 9.0 09/09/2023   PHOS 2.8 03/30/2023   PROT 6.2 (L) 09/09/2023   ALBUMIN 2.4 (L) 09/09/2023   LABGLOB 2.0 08/15/2023   BILITOT 0.4 09/09/2023   ALKPHOS 88 09/09/2023   AST 12 (L) 09/09/2023   ALT 18 09/09/2023   ANIONGAP 9 09/09/2023   Imaging: None

## 2023-09-09 NOTE — Discharge Instructions (Signed)
 1)OPAT Orders (patient to get abx with HD center and no opat arrangement needed)  Discharge antibiotics: Vancomycin 1 gm to be given thrice weekly after dialysis days---TTS schedule for 4 weeks from 09/07/23 (Last dose 10/05/23)  Per pharmacy protocol/HD center Aim for Vancomycin trough 15-20 or AUC 400-550 (unless otherwise indicated)  Duration: 4 weeks from 09/07/23 End Date: 10/05/23 HD catheter Care Per Protocol: Labs weekly while on IV antibiotics: _c_ CBC with differential _x_ CMP Fax weekly labs to [phone]  Clinic Follow Up Appt: 10/11/23 @ 230  @ [facility], [address], [place], [place] [address], Phone: [phone]  2)Avoid ibuprofen/Advil/Aleve/Motrin/Goody Powders/Naproxen/BC powders/Meloxicam/Diclofenac/Indomethacin and other Nonsteroidal anti-inflammatory medications as these will make you more likely to bleed and can cause stomach ulcers, can also cause Kidney problems.   3)Very Low-salt diet advised---Less than 2 gm of Sodium per day advised----ok to use Mrs DASH salt substitute instead of Salt  4)Weigh yourself daily, call if you gain more than 3 pounds in 1 day or more than 5 pounds in 1 week as your hemodialysis schedule and dry weight may need to be adjusted  5)Limit your Fluid  intake to no more than 50 ounces (1.5 Liters) per day

## 2023-09-09 NOTE — Progress Notes (Signed)
 PHARMACY CONSULT NOTE FOR:  OUTPATIENT  Antibiotics with Dialysis   Indication: MRSA Bacteremia Regimen: Vancomycin 1 gm with HD every TThS End date: 10/04/23   No formal OPAT will be done as patient will receive antibiotics with HD.     Thank you for allowing pharmacy to be a part of this patient's care.  Sharin Mons, PharmD, BCPS, BCIDP Infectious Diseases Clinical Pharmacist Phone: [phone] 09/09/2023, 10:40 AM

## 2023-09-09 NOTE — Consult Note (Signed)
 [facility] Consult  Reason for Consult: Temporary hemodialysis catheter insertion Referring Physician: Dr. Mariea Clonts  Chief Complaint   Weakness     HPI: Jasmine [name] is a 53 y.o. female who presented to the ED with generalized malaise, weakness, fatigue, fever, and chills.  She was noted to have a MRSA bacteremia with sepsis.  Patient has a past medical history significant for type 1 diabetes on insulin pump, coronary artery disease status post drug-eluting stent placement x 4 on 08/30/2023, end-stage renal disease on HD Tuesday, Thursday, Saturday through a right IJ tunneled dialysis catheter, OSA, obesity, hypertension, hyperlipidemia, bipolar disease, and recent I&D of a right thigh abscess on 3/28.  Given her bacteremia, her right IJ tunneled hemodialysis catheter was removed by IR on 4/2.  Her culture sent from the catheter tip was positive for MRSA.  She has had negative blood cultures since 4/1.  She will need hemodialysis over the weekend, and IR is unable to place a tunneled hemodialysis catheter.  General surgery was consulted for temporary dialysis catheter insertion.  She denies any other vascular surgeries then her tunneled dialysis catheter placement and her cardiac stent placement.  Past Medical History:  Diagnosis Date   Anxiety    Arthritis    Asthma    Balance problems    Bipolar disorder (HCC)    Charcot ankle    Chronic fatigue    Chronic kidney disease    STAGE 3-4   Depression    Diabetes mellitus    DKA, type 1 (HCC) 11/04/2011   Elevated cholesterol    Fibromyalgia    GERD (gastroesophageal reflux disease)    Headache    History of suicidal ideation    Hyperlipemia    Hypertension    Hypothyroidism    IBS (irritable bowel syndrome)    Memory changes    Obesity    Sleep apnea    HAS C -PAP / DOES NOT USE   Stress incontinence    Pt had surgery to correct this.   Tachycardia    Tobacco abuse    Tremor    UTI (lower urinary tract  infection)     Past Surgical History:  Procedure Laterality Date   CORONARY IMAGING/OCT N/A 08/30/2023   Procedure: CORONARY IMAGING/OCT;  Surgeon: Orbie Pyo, MD;  Location: [facility];  Service: Cardiovascular;  Laterality: N/A;   CORONARY STENT INTERVENTION N/A 08/30/2023   Procedure: CORONARY STENT INTERVENTION;  Surgeon: Orbie Pyo, MD;  Location: [facility];  Service: Cardiovascular;  Laterality: N/A;   INCONTINENCE SURGERY     LEFT HEART CATH AND CORONARY ANGIOGRAPHY N/A 07/15/2023   Procedure: LEFT HEART CATH AND CORONARY ANGIOGRAPHY;  Surgeon: Orbie Pyo, MD;  Location: [facility];  Service: Cardiovascular;  Laterality: N/A;   LEFT HEART CATH AND CORONARY ANGIOGRAPHY N/A 08/30/2023   Procedure: LEFT HEART CATH AND CORONARY ANGIOGRAPHY;  Surgeon: Orbie Pyo, MD;  Location: [facility];  Service: Cardiovascular;  Laterality: N/A;   NASAL FRACTURE SURGERY     ovary removed     OVARY SURGERY     PUBOVAGINAL SLING  08/16/2011   Procedure: Leonides Grills;  Surgeon: Valetta Fuller, MD;  Location: [facility];  Service: Urology;  Laterality: N/A;          TEE WITHOUT CARDIOVERSION N/A 09/08/2023   Procedure: ECHOCARDIOGRAM, TRANSESOPHAGEAL;  Surgeon: Antoine Poche, MD;  Location: [facility];  Service: Endoscopy;  Laterality:  N/A;   UTERINE FIBROID SURGERY  2001    Family History  Problem Relation Age of Onset   Asthma Mother    Bipolar disorder Mother    Heart disease Father    Lymphoma Father    Hypertension Father    Thyroid disease Father    Hyperlipidemia Father    Diabetes Father    Cancer Paternal Grandmother        lung and breast   Bladder Cancer Paternal Grandfather    Suicidality Maternal Grandfather    Thyroid disease Brother     Social History   Tobacco Use   Smoking status: Former    Current packs/day: 0.00    Average packs/day: 0.8 packs/day for 20.0 years (15.0 ttl pk-yrs)    Types: Cigarettes    Start  date: 06/08/1991    Quit date: 06/08/2011    Years since quitting: 12.2   Smokeless tobacco: Never  Vaping Use   Vaping status: Never Used  Substance Use Topics   Alcohol use: No   Drug use: No    Medications: I have reviewed the patient's current medications.  Allergies  Allergen Reactions   Ciprofloxacin Swelling and Other (See Comments)    Per pt caused lips swell and nauseous feeling   Levaquin [Levofloxacin] Swelling and Other (See Comments)    Per pt caused lips swell and nauseous feeling   Linaclotide Other (See Comments)    Cause severe dehydration   Promethazine Other (See Comments)    Completely wipes out/fatigue   Buspar [Buspirone] Other (See Comments)    abd cramping   Advair Diskus [Fluticasone-Salmeterol] Other (See Comments)    Thrush    Biaxin [Clarithromycin] Rash   Hydroxyzine Palpitations     ROS:  Pertinent items are noted in HPI.  Blood pressure (!) 148/61, pulse 67, temperature 98.1 F (36.7 C), temperature source Oral, resp. rate 20, height 5\' 9"  (1.753 m), weight 118.7 kg, last menstrual period 03/23/2017, SpO2 99%. Physical Exam Vitals reviewed.  Constitutional:      Appearance: Normal appearance.  HENT:     Head: Normocephalic and atraumatic.  Eyes:     Extraocular Movements: Extraocular movements intact.     Pupils: Pupils are equal, round, and reactive to light.  Cardiovascular:     Rate and Rhythm: Normal rate.  Pulmonary:     Effort: Pulmonary effort is normal.  Chest:     Comments: Right chest tunneled dialysis catheter exit site with dressing in place, no strikethrough noted Abdominal:     General: There is no distension.     Palpations: Abdomen is soft.     Tenderness: There is no abdominal tenderness.  Musculoskeletal:        General: Normal range of motion.     Cervical back: Normal range of motion.  Skin:    General: Skin is warm and dry.  Neurological:     General: No focal deficit present.     Mental Status: She is  alert and oriented to person, place, and time.  Psychiatric:        Mood and Affect: Mood normal.        Behavior: Behavior normal.     Results: Results for orders placed or performed during the hospital encounter of 09/05/23 (from the past 48 hours)  Glucose, capillary     Status: Abnormal   Collection Time: 09/07/23  4:05 PM  Result Value Ref Range   Glucose-Capillary 145 (H) 70 - 99 mg/dL  Comment: Glucose reference range applies only to samples taken after fasting for at least 8 hours.  Culture, blood (Routine X 2) w Reflex to ID Panel     Status: None (Preliminary result)   Collection Time: 09/07/23  6:55 PM   Specimen: BLOOD  Result Value Ref Range   Specimen Description BLOOD RIGHT ANTECUBITAL    Special Requests      BOTTLES DRAWN AEROBIC AND ANAEROBIC Blood Culture adequate volume   Culture      NO GROWTH 2 DAYS Performed at [facility], [address], [place], [place] [address]    Report Status PENDING   Culture, blood (Routine X 2) w Reflex to ID Panel     Status: None (Preliminary result)   Collection Time: 09/07/23  6:55 PM   Specimen: BLOOD  Result Value Ref Range   Specimen Description BLOOD LEFT ANTECUBITAL    Special Requests      BOTTLES DRAWN AEROBIC AND ANAEROBIC Blood Culture adequate volume   Culture      NO GROWTH 2 DAYS Performed at [facility], [address] [place], [place] [address]    Report Status PENDING   Glucose, capillary     Status: Abnormal   Collection Time: 09/07/23  7:35 PM  Result Value Ref Range   Glucose-Capillary 112 (H) 70 - 99 mg/dL    Comment: Glucose reference range applies only to samples taken after fasting for at least 8 hours.  Glucose, capillary     Status: Abnormal   Collection Time: 09/07/23 11:38 PM  Result Value Ref Range   Glucose-Capillary 157 (H) 70 - 99 mg/dL    Comment: Glucose reference range applies only to samples taken after fasting for at least 8 hours.  Glucose, capillary     Status: Abnormal    Collection Time: 09/08/23  5:01 AM  Result Value Ref Range   Glucose-Capillary 66 (L) 70 - 99 mg/dL    Comment: Glucose reference range applies only to samples taken after fasting for at least 8 hours.  Glucose, capillary     Status: Abnormal   Collection Time: 09/08/23  5:35 AM  Result Value Ref Range   Glucose-Capillary 106 (H) 70 - 99 mg/dL    Comment: Glucose reference range applies only to samples taken after fasting for at least 8 hours.  Glucose, capillary     Status: Abnormal   Collection Time: 09/08/23  7:34 AM  Result Value Ref Range   Glucose-Capillary 100 (H) 70 - 99 mg/dL    Comment: Glucose reference range applies only to samples taken after fasting for at least 8 hours.  Glucose, capillary     Status: Abnormal   Collection Time: 09/08/23 11:44 AM  Result Value Ref Range   Glucose-Capillary 160 (H) 70 - 99 mg/dL    Comment: Glucose reference range applies only to samples taken after fasting for at least 8 hours.  Glucose, capillary     Status: Abnormal   Collection Time: 09/08/23  4:47 PM  Result Value Ref Range   Glucose-Capillary 177 (H) 70 - 99 mg/dL    Comment: Glucose reference range applies only to samples taken after fasting for at least 8 hours.  Glucose, capillary     Status: Abnormal   Collection Time: 09/08/23  7:55 PM  Result Value Ref Range   Glucose-Capillary 376 (H) 70 - 99 mg/dL    Comment: Glucose reference range applies only to samples taken after fasting for at least 8 hours.  Glucose, capillary     Status: Abnormal   Collection Time: 09/08/23 11:50 PM  Result Value Ref Range   Glucose-Capillary 244 (H) 70 - 99 mg/dL    Comment: Glucose reference range applies only to samples taken after fasting for at least 8 hours.  Glucose, capillary     Status: Abnormal   Collection Time: 09/09/23  3:45 AM  Result Value Ref Range   Glucose-Capillary 132 (H) 70 - 99 mg/dL    Comment: Glucose reference range applies only to samples taken after fasting for at  least 8 hours.  CBC     Status: Abnormal   Collection Time: 09/09/23  4:30 AM  Result Value Ref Range   WBC 7.4 4.0 - 10.5 K/uL   RBC 3.40 (L) 3.87 - 5.11 MIL/uL   Hemoglobin 10.1 (L) 12.0 - 15.0 g/dL   HCT 16.1 (L) 09.6 - 04.5 %   MCV 94.7 80.0 - 100.0 fL   MCH 29.7 26.0 - 34.0 pg   MCHC 31.4 30.0 - 36.0 g/dL   RDW 40.9 81.1 - 91.4 %   Platelets 247 150 - 400 K/uL   nRBC 0.0 0.0 - 0.2 %    Comment: Performed at [facility], [address] [place], [place] [address]  Comprehensive metabolic panel with GFR     Status: Abnormal   Collection Time: 09/09/23  4:30 AM  Result Value Ref Range   Sodium 135 135 - 145 mmol/L   Potassium 3.4 (L) 3.5 - 5.1 mmol/L   Chloride 99 98 - 111 mmol/L   CO2 27 22 - 32 mmol/L   Glucose, Bld 119 (H) 70 - 99 mg/dL    Comment: Glucose reference range applies only to samples taken after fasting for at least 8 hours.   BUN 37 (H) 6 - 20 mg/dL   Creatinine, Ser 6.21 (H) 0.44 - 1.00 mg/dL   Calcium 9.0 8.9 - 30.8 mg/dL   Total Protein 6.2 (L) 6.5 - 8.1 g/dL   Albumin 2.4 (L) 3.5 - 5.0 g/dL   AST 12 (L) 15 - 41 U/L   ALT 18 0 - 44 U/L   Alkaline Phosphatase 88 38 - 126 U/L   Total Bilirubin 0.4 0.0 - 1.2 mg/dL   GFR, Estimated 10 (L) >60 mL/min    Comment: (NOTE) Calculated using the CKD-EPI Creatinine Equation (2021)    Anion gap 9 5 - 15    Comment: Performed at [facility], [address] [place], [place] [address]  Glucose, capillary     Status: Abnormal   Collection Time: 09/09/23  7:53 AM  Result Value Ref Range   Glucose-Capillary 106 (H) 70 - 99 mg/dL    Comment: Glucose reference range applies only to samples taken after fasting for at least 8 hours.  Glucose, capillary     Status: Abnormal   Collection Time: 09/09/23 11:19 AM  Result Value Ref Range   Glucose-Capillary 244 (H) 70 - 99 mg/dL    Comment: Glucose reference range applies only to samples taken after fasting for at least 8 hours.    ECHO TEE Result Date: 09/08/2023     TRANSESOPHOGEAL ECHO REPORT   Patient Name:   PETRONA WYETH Date of Exam: 09/08/2023 Medical Rec #:  [id number]         Height:       69.0 in Accession #:    [id number]        Weight:       261.7  lb Date of Birth:  12/11/1970         BSA:          2.316 m Patient Age:    52 years          BP:           182/74 mmHg Patient Gender: F                 HR:           72 bpm. [facility] Procedure: Transesophageal Echo, Cardiac Doppler and Color Doppler (Both            Spectral and Color Flow Doppler were utilized during procedure). Indications:    Bacteremia R78.81  History:        Patient has prior history of Echocardiogram examinations, most                 recent 09/07/2023. CAD; Risk Factors:Hypertension, Diabetes,                 Dyslipidemia and Former Smoker. ESRD on dailysis.  Sonographer:    Celesta Gentile RCS Referring Phys: [id number] Basilio Cairo PROCEDURE: The transesophogeal probe was passed without difficulty through the esophogus of the patient. Sedation performed by different physician. Image quality was excellent. The patient developed no complications during the procedure.  IMPRESSIONS  1. Left ventricular ejection fraction, by estimation, is 60 to 65%. The left ventricle has normal function.  2. Right ventricular systolic function is normal. The right ventricular size is normal.  3. Left atrial size was mildly dilated. No left atrial/left atrial appendage thrombus was detected. The LAA emptying velocity was 60 cm/s.  4. The mitral valve is normal in structure. Trivial mitral valve regurgitation. No evidence of mitral stenosis.  5. The aortic valve is tricuspid. Aortic valve regurgitation is not visualized. No aortic stenosis is present. Conclusion(s)/Recommendation(s): No evidence of vegetation/infective endocarditis on this transesophageael echocardiogram. FINDINGS  Left Ventricle: Left ventricular ejection fraction, by estimation, is 60 to 65%. The left ventricle has normal function.  The left ventricular internal cavity size was normal in size. Right Ventricle: The right ventricular size is normal. No increase in right ventricular wall thickness. Right ventricular systolic function is normal. Left Atrium: Left atrial size was mildly dilated. No left atrial/left atrial appendage thrombus was detected. The LAA emptying velocity was 60 cm/s. Right Atrium: Right atrial size was normal in size. Pericardium: There is no evidence of pericardial effusion. Mitral Valve: The mitral valve is normal in structure. Trivial mitral valve regurgitation. No evidence of mitral valve stenosis. Tricuspid Valve: The tricuspid valve is normal in structure. Tricuspid valve regurgitation is mild . No evidence of tricuspid stenosis. Aortic Valve: The aortic valve is tricuspid. Aortic valve regurgitation is not visualized. No aortic stenosis is present. Pulmonic Valve: The pulmonic valve was normal in structure. Pulmonic valve regurgitation is not visualized. No evidence of pulmonic stenosis. Aorta: The aortic root is normal in size and structure. IAS/Shunts: No atrial level shunt detected by color flow Doppler.   AORTA Ao Root diam: 2.90 cm Dina Rich MD Electronically signed by Dina Rich MD Signature Date/Time: 09/08/2023/3:25:16 PM    Final    ECHOCARDIOGRAM COMPLETE Result Date: 09/07/2023    ECHOCARDIOGRAM REPORT   Patient Name:   TANA TREFRY Date of Exam: 09/07/2023 Medical Rec #:  [id number]         Height:       69.0 in Accession #:  [id number]        Weight:       261.7 lb Date of Birth:  05-18-1971         BSA:          2.316 m Patient Age:    52 years          BP:           108/42 mmHg Patient Gender: F                 HR:           77 bpm. [facility] Procedure: 2D Echo, Cardiac Doppler, Color Doppler and Intracardiac            Opacification Agent (Both Spectral and Color Flow Doppler were            utilized during procedure). Indications:    Bacteremia R78.81  History:         Patient has prior history of Echocardiogram examinations, most                 recent 07/15/2023. CAD; Risk Factors:Hypertension, Diabetes,                 Dyslipidemia and Former Smoker. ESRD on dailysis.  Sonographer:    Celesta Gentile RCS Referring Phys: [phone] [place]one Nine IMPRESSIONS  1. Left ventricular ejection fraction, by estimation, is 55 to 60%. The left ventricle has normal function. The left ventricle has no regional wall motion abnormalities. Left ventricular diastolic parameters were normal.  2. Right ventricular systolic function is normal. The right ventricular size is normal.  3. The mitral valve is normal in structure. Trivial mitral valve regurgitation. No evidence of mitral stenosis.  4. The tricuspid valve is abnormal.  5. The aortic valve has an indeterminant number of cusps. Aortic valve regurgitation is not visualized. No aortic stenosis is present.  6. The inferior vena cava is normal in size with greater than 50% respiratory variability, suggesting right atrial pressure of 3 mmHg. FINDINGS  Left Ventricle: Left ventricular ejection fraction, by estimation, is 55 to 60%. The left ventricle has normal function. The left ventricle has no regional wall motion abnormalities. Definity contrast agent was given IV to delineate the left ventricular  endocardial borders. The left ventricular internal cavity size was normal in size. There is no left ventricular hypertrophy. Left ventricular diastolic parameters were normal. Right Ventricle: The right ventricular size is normal. Right vetricular wall thickness was not well visualized. Right ventricular systolic function is normal. Left Atrium: Left atrial size was normal in size. Right Atrium: Right atrial size was normal in size. Pericardium: There is no evidence of pericardial effusion. Mitral Valve: The mitral valve is normal in structure. There is mild thickening of the mitral valve leaflet(s). There is mild calcification of the mitral valve  leaflet(s). Mild mitral annular calcification. Trivial mitral valve regurgitation. No evidence  of mitral valve stenosis. Tricuspid Valve: The tricuspid valve is abnormal. Tricuspid valve regurgitation is mild . No evidence of tricuspid stenosis. Aortic Valve: The aortic valve has an indeterminant number of cusps. Aortic valve regurgitation is not visualized. No aortic stenosis is present. Aortic valve mean gradient measures 7.0 mmHg. Aortic valve peak gradient measures 13.2 mmHg. Aortic valve area, by VTI measures 2.61 cm. Pulmonic Valve: The pulmonic valve was not well visualized. Pulmonic valve regurgitation is not visualized. No evidence of pulmonic stenosis. Aorta: The aortic root is normal in size and structure  and the ascending aorta was not well visualized. Venous: The inferior vena cava is normal in size with greater than 50% respiratory variability, suggesting right atrial pressure of 3 mmHg. IAS/Shunts: No atrial level shunt detected by color flow Doppler.  LEFT VENTRICLE PLAX 2D LVIDd:         5.10 cm   Diastology LVIDs:         2.80 cm   LV e' medial:    7.29 cm/s LV PW:         1.00 cm   LV E/e' medial:  19.5 LV IVS:        1.00 cm   LV e' lateral:   12.30 cm/s LVOT diam:     2.10 cm   LV E/e' lateral: 11.5 LV SV:         112 LV SV Index:   48 LVOT Area:     3.46 cm  RIGHT VENTRICLE RV S prime:     15.20 cm/s TAPSE (M-mode): 2.0 cm LEFT ATRIUM             Index        RIGHT ATRIUM           Index LA diam:        4.70 cm 2.03 cm/m   RA Area:     12.10 cm LA Vol (A2C):   71.3 ml 30.79 ml/m  RA Volume:   27.00 ml  11.66 ml/m LA Vol (A4C):   71.2 ml 30.74 ml/m LA Biplane Vol: 72.6 ml 31.35 ml/m  AORTIC VALVE AV Area (Vmax):    2.71 cm AV Area (Vmean):   2.57 cm AV Area (VTI):     2.61 cm AV Vmax:           182.00 cm/s AV Vmean:          125.000 cm/s AV VTI:            0.429 m AV Peak Grad:      13.2 mmHg AV Mean Grad:      7.0 mmHg LVOT Vmax:         142.50 cm/s LVOT Vmean:        92.800 cm/s  LVOT VTI:          0.324 m LVOT/AV VTI ratio: 0.75  AORTA Ao Root diam: 3.30 cm MITRAL VALVE                TRICUSPID VALVE MV Area (PHT): 3.12 cm     TR Peak grad:   26.0 mmHg MV Decel Time: 243 msec     TR Vmax:        255.00 cm/s MV E velocity: 142.00 cm/s MV A velocity: 115.00 cm/s  SHUNTS MV E/A ratio:  1.23         Systemic VTI:  0.32 m                             Systemic Diam: 2.10 cm Dina Rich MD Electronically signed by Dina Rich MD Signature Date/Time: 09/07/2023/3:34:00 PM    Final      Assessment & Plan:  PAULENA SERVAIS is a 53 y.o. female who was admitted with MRSA bacteremia.  Imaging and blood work evaluated by myself.  -Discussed with the patient that she will need temporary dialysis catheter insertion, as tunneled hemodialysis catheter will not be able to be placed over the weekend by IR -The risk  and benefits of right IJ hemodialysis catheter insertion were discussed including but not limited to bleeding, infection, injury to surrounding structures, pneumothorax, and need for additional procedures.  After careful consideration, DOLA LUNSFORD has decided to proceed with catheter insertion.  -Discussed with the patient that she is at higher risk of bleeding given her Plavix that is currently on board -Will tentatively plan to insert temporary hemodialysis catheter tomorrow morning, as I am unable to place it this afternoon -Plan for patient to receive dialysis tomorrow per nephrology -Patient will need to be transferred to a stepdown bed for insertion -Will defer to IR regarding timing for tunneled hemodialysis catheter insertion -Care per primary team, ID, and nephrology  All questions were answered to the satisfaction of the patient.  Note: Portions of this report may have been transcribed using voice recognition software. Every effort has been made to ensure accuracy; however, inadvertent computerized transcription errors may still be present.   -- Theophilus Kinds, DO [facility] [address] [place], [place] [address] [phone] (office)

## 2023-09-09 NOTE — Plan of Care (Signed)
  Problem: Education: Goal: Knowledge of General Education information will improve Description: Including pain rating scale, medication(s)/side effects and non-pharmacologic comfort measures Outcome: Progressing   Problem: Health Behavior/Discharge Planning: Goal: Ability to manage health-related needs will improve Outcome: Progressing   Problem: Clinical Measurements: Goal: Respiratory complications will improve Outcome: Progressing Goal: Cardiovascular complication will be avoided Outcome: Progressing   Problem: Clinical Measurements: Goal: Cardiovascular complication will be avoided Outcome: Progressing   Problem: Activity: Goal: Risk for activity intolerance will decrease Outcome: Progressing   Problem: Nutrition: Goal: Adequate nutrition will be maintained Outcome: Progressing

## 2023-09-09 NOTE — Progress Notes (Addendum)
 [facility] PROGRESS NOTE  Jasmine [name] (DOB: 1970-07-02) ZOX:[id number] PCP: Richmond Campbell., PA-C  Brief Narrative: Jasmine [name] is a 53 y.o. female with a history of T1DM on insulin pump, CAD s/p DES x4 on 08/30/2023, ESRD (on HD TTS thru right IJ TDC), OSA on 3L nocturnal O2, obesity, HTN, HLD, bipolar disorder, IBS and recent I&D of right thigh abscess (3/28) who presented by EMS from home with feeling ill, weak, fatigued starting the night before associated with fever of 101.40F and shaking chills.  In the ED she was febrile with leukocytosis, tachypneic to 24/min, placed on supplemental oxygen (not on this at baseline). HR and BP were stable. Lactic acid normal at 1.2. Initial troponin 273, rising subsequently though had no dynamic ECG changes nor chest pain, felt per cardiology to be demand myocardial ischemia. CXR demonstrated a right hilar opacity.    IV antibiotics were started, blood cultures drawn, and the patient was admitted to SDU. Blood cultures have grown MRSA.   Subjective: -Resting comfortably -No fever  Or chills   No Nausea, Vomiting or Diarrhea Primary nurse at bedside  Objective: BP (!) 166/62 (BP Location: Right Arm)   Pulse 80   Temp 98.2 F (36.8 C) (Oral)   Resp 20   Ht 5\' 9"  (1.753 m)   Wt 116.3 kg   LMP 03/23/2017 (Approximate)   SpO2 98%   BMI 37.86 kg/m    Physical Exam  Gen:- Awake Alert, in no acute distress  HEENT:- [place].AT, No sclera icterus, right HD catheter removed on 09/07/2023 Nose- [place] 2L/min Neck-Supple Neck,No JVD,.  Lungs-improved air movement, no wheezing  CV- S1, S2 normal, RRR Abd-  +ve B.Sounds, Abd Soft, No tenderness, increased truncal adiposity    Extremity/Skin:- No  edema,   good pedal pulses  Psych-affect is appropriate, oriented x3 Neuro-generalized weakness no new focal deficits, no tremors MSK-right thigh wound with dressing   Assessment & Plan: 1)MRSA bacteremia and sepsis -in the setting of right  thigh abscess -received Rocephin and doxycycline initially 09/09/23 -No fevers since 09/05/2023 -TTE-from 09/07/2023 with EF of 55 to 60%, no diastolic dysfunction, no regional wall motion abnormalities, no aortic stenosis, no mitral stenosis, no evidence of endocarditis, -TEE  on 09/08/2023 without vegetations --Currently receiving IV vancomycin with hemodialysis started on 09/06/2023 -Right internal jugular tunneled HD catheter removed on 09/07/2023 to allow for line holiday - Repeat blood cultures blood cultures from 09/06/2023 and 09/07/23 NGTD-  -Plan is to give vancomycin IV 1 g on TTS schedule after each dialysis session for 4 weeks (started from negative blood cultures before 09/07/2023 with laboratories from 10/05/2023) -Please see discharge instructions -ID consult appreciated   2)CAD s/p DES x4 08/30/2023 with troponin elevation, HTN, HLD:  - Continue DAPT - Continue Cresto -Restart PTA Coreg and hydralazine --- Cardiology consulted, recommending no further work up or treatment, pt remains without anginal symptoms. Has a scheduled follow up with Jari Favre, PA 09/14/23 at 10:55am   3)Acute on chronic hypoxic respiratory failure (nocturnal O2 chronically):  - Continue supplemental oxygen to maintain normal WOB and SpO2 >92%.  - Abx as above. -Oxygen requirement appears to be back to baseline   4)ESRD: TTS schedule -Nephrology consult appreciated --discussed with Dr. Arrie Aran on 09/07/2023 - Hold non-HD days torsemide with sepsis as above -Last HD session on 09/06/2023 -Right internal jugular tunneled HD catheter removed on 09/07/2023 to allow for line holiday -Plan is for IR to place Tunneled HD catheter placement on Saturday,  09/10/2023 at [facility] if cultures are still negative. -Patient can be discharged home from [facility] after hemodialysis on 09/10/2023  5)MRSA Right thigh abscess:   - Repeat I&D in ED 09/05/23, continue basic local wound care and abx as above. -Antibiotics as above #1    6)DM1--last A1c 5.6 reflecting excellent diabetic control PTA - Insulin pump ran out of insulin,  Reduce Semglee dose tonight as patient will be n.p.o. for tunneled HD catheter placement in a.m. -At discharge patient can resume insulin pump Use Novolog/Humalog Sliding scale insulin with Accu-Cheks/Fingersticks as ordered   7)Bipolar disorder: Quiescent.  - Continue home medications including lamictal, wellbutrin, cymbalta, prn ativan   8)Hypothyroidism:  - Continue synthroid at dosing, typically takes 1.5x dose on Sundays.   Discharge Instructions:- 1)OPAT Orders (patient to get abx with HD center and no opat arrangement needed)  Discharge antibiotics: Vancomycin 1 gm to be given thrice weekly after dialysis days---TTS schedule for 4 weeks from 09/07/23 (Last dose 10/05/23)  Per pharmacy protocol/HD center Aim for Vancomycin trough 15-20 or AUC 400-550 (unless otherwise indicated)  Duration: 4 weeks from 09/07/23 End Date: 10/05/23 HD catheter Care Per Protocol: Labs weekly while on IV antibiotics: _c_ CBC with differential _x_ CMP Fax weekly labs to [phone]  Clinic Follow Up Appt: 10/11/23 @ 230  @ [facility], [address], [place], [place] [address], Phone: [phone]  2)Avoid ibuprofen/Advil/Aleve/Motrin/Goody Powders/Naproxen/BC powders/Meloxicam/Diclofenac/Indomethacin and other Nonsteroidal anti-inflammatory medications as these will make you more likely to bleed and can cause stomach ulcers, can also cause Kidney problems.   3)Very Low-salt diet advised---Less than 2 gm of Sodium per day advised----ok to use Mrs DASH salt substitute instead of Salt  4)Weigh yourself daily, call if you gain more than 3 pounds in 1 day or more than 5 pounds in 1 week as your hemodialysis schedule and dry weight may need to be adjusted  5)Limit your Fluid  intake to no more than 50 ounces (1.5 Liters) per day  Shon Hale, MD [facility] [email or web address] 09/09/2023, 7:39 PM

## 2023-09-09 NOTE — Consult Note (Signed)
 Chief Complaint: ESRD. Line holiday due to bacteremia. Request is for tunneled HD catheter placement for dialysis access  Referring Physician(s): Dr. Jaynie Collins  Supervising Physician: Roanna Banning  Patient Status:  [facility] - Inpatient.   History of Present Illness: Jasmine [name] is a 53 y.o. female  inpatient. History of IBS, GERD,  DM, CAD s/p PCI on 3.25.25, HLD, CHF, ESRD on HD. Presented to the  ED at [facility] on 3.31.25 with generalized weakness. Found to be septic (MRSA bacteremia) secondary to a left thigh abscess. RIJ TDC was removed on 4.2.25 for a line holiday.  Cultures of the tip grew staph aureus. Plan is for general surgery to place a temporary dialysis catheter on 4.5.25 followed by a tunneled hemodialysis placement by IR prior to patient discharge  Currently without any significant complaints. Patient alert and sitting up in bed,calm. States that she had subjective fevers and chills overnight that self resolved. Denies any headache, chest pain, SOB, cough, abdominal pain, nausea, vomiting or bleeding.   Albumin 2.4, ALT 6.2, BUN 37, Cr 5.16, GFR < 10. Blood cultures from 4.2.25 show no growth in 2 days. Patient is on ASA 81 mg, Plavix and prophylactic heparin. Allergies include, ciprofloxacin, Levaquin.   Return precautions and treatment recommendations and follow-up discussed with the patient  who is agreeable with the plan.    Past Medical History:  Diagnosis Date   Anxiety    Arthritis    Asthma    Balance problems    Bipolar disorder (HCC)    Charcot ankle    Chronic fatigue    Chronic kidney disease    STAGE 3-4   Depression    Diabetes mellitus    DKA, type 1 (HCC) 11/04/2011   Elevated cholesterol    Fibromyalgia    GERD (gastroesophageal reflux disease)    Headache    History of suicidal ideation    Hyperlipemia    Hypertension    Hypothyroidism    IBS (irritable bowel syndrome)    Memory changes    Obesity    Sleep apnea    HAS C -PAP /  DOES NOT USE   Stress incontinence    Pt had surgery to correct this.   Tachycardia    Tobacco abuse    Tremor    UTI (lower urinary tract infection)     Past Surgical History:  Procedure Laterality Date   CORONARY IMAGING/OCT N/A 08/30/2023   Procedure: CORONARY IMAGING/OCT;  Surgeon: Orbie Pyo, MD;  Location: [facility];  Service: Cardiovascular;  Laterality: N/A;   CORONARY STENT INTERVENTION N/A 08/30/2023   Procedure: CORONARY STENT INTERVENTION;  Surgeon: Orbie Pyo, MD;  Location: [facility];  Service: Cardiovascular;  Laterality: N/A;   INCONTINENCE SURGERY     LEFT HEART CATH AND CORONARY ANGIOGRAPHY N/A 07/15/2023   Procedure: LEFT HEART CATH AND CORONARY ANGIOGRAPHY;  Surgeon: Orbie Pyo, MD;  Location: [facility];  Service: Cardiovascular;  Laterality: N/A;   LEFT HEART CATH AND CORONARY ANGIOGRAPHY N/A 08/30/2023   Procedure: LEFT HEART CATH AND CORONARY ANGIOGRAPHY;  Surgeon: Orbie Pyo, MD;  Location: [facility];  Service: Cardiovascular;  Laterality: N/A;   NASAL FRACTURE SURGERY     ovary removed     OVARY SURGERY     PUBOVAGINAL SLING  08/16/2011   Procedure: Leonides Grills;  Surgeon: Valetta Fuller, MD;  Location: [facility];  Service: Urology;  Laterality: N/A;  TEE WITHOUT CARDIOVERSION N/A 09/08/2023   Procedure: ECHOCARDIOGRAM, TRANSESOPHAGEAL;  Surgeon: Antoine Poche, MD;  Location: [facility];  Service: Endoscopy;  Laterality: N/A;   UTERINE FIBROID SURGERY  2001    Allergies: Ciprofloxacin, Levaquin [levofloxacin], Linaclotide, Promethazine, Buspar [buspirone], Advair diskus [fluticasone-salmeterol], Biaxin [clarithromycin], and Hydroxyzine  Medications: Prior to Admission medications   Medication Sig Start Date End Date Taking? Authorizing Provider  acetaminophen (TYLENOL) 500 MG tablet Take 500-1,000 mg by mouth every 6 (six) hours as needed (pain.).   Yes [provider]  Ascorbic  Acid (VITAMIN C PO) Take 1 tablet by mouth in the morning.   Yes [provider]  aspirin EC 81 MG tablet Take 81 mg by mouth at bedtime. Swallow whole.   Yes [provider]  azelastine (ASTELIN) 0.1 % nasal spray Place 2 sprays into both nostrils 2 (two) times daily. Use in each nostril as directed Patient taking differently: Place 2 sprays into both nostrils in the morning. Use in each nostril as directed 08/01/23  Yes Glenford Bayley, NP  buPROPion (WELLBUTRIN XL) 300 MG 24 hr tablet Take 1 tablet (300 mg total) by mouth daily. Patient taking differently: Take 300 mg by mouth every evening. 03/23/23 09/19/23 Yes Hisada, Barbee Cough, MD  carvedilol (COREG) 12.5 MG tablet Take 12.5 mg by mouth 2 (two) times daily with a meal. 01/17/23 01/17/24 Yes [provider]  Cholecalciferol (VITAMIN D-3 PO) Take 1 tablet by mouth in the morning.   Yes [provider]  clopidogrel (PLAVIX) 75 MG tablet Take 1 tablet (75 mg total) by mouth daily. 02/22/23  Yes Alver Sorrow, NP  diclofenac Sodium (VOLTAREN) 1 % GEL Apply 2 g topically 4 (four) times daily as needed (pain.).   Yes [provider]  dicyclomine (BENTYL) 10 MG capsule Take 10 mg by mouth 2 (two) times daily as needed for spasms. 12/25/22  Yes [provider]  DULoxetine (CYMBALTA) 30 MG capsule Take 1 capsule (30 mg total) by mouth daily. 07/03/23 10/31/23 Yes Neysa Hotter, MD  folic acid (FOLVITE) 800 MCG tablet Take 800 mcg by mouth in the morning.   Yes [provider]  glucagon (GLUCAGEN HYPOKIT) 1 MG SOLR injection Inject 1 mg into the skin once as needed for up to 1 dose for low blood sugar. GlucaGen HypoKit 1 mg Injection 03/31/23  Yes Johnson, Clanford L, MD  hydrALAZINE (APRESOLINE) 25 MG tablet Take 25 mg by mouth 2 (two) times daily. 05/03/23  Yes [provider]  insulin detemir (LEVEMIR) 100 UNIT/ML injection Inject 27 Units into the skin 2 (two) times daily as needed (if  insulin pump inoperable). 04/04/23  Yes [provider]  lamoTRIgine (LAMICTAL) 200 MG tablet Take 1 tablet (200 mg total) by mouth daily. Patient taking differently: Take 200 mg by mouth every evening. 04/24/23 10/21/23 Yes Neysa Hotter, MD  levothyroxine (SYNTHROID) 175 MCG tablet Take 1.5 tablets (mcg) by mouth on Sundays in the mornings & take 1 tablet (175 mcg) by mouth on all other days. 11/20/21  Yes [provider]  LORazepam (ATIVAN) 0.5 MG tablet Take 1 tablet (0.5 mg total) by mouth daily as needed for anxiety. Patient taking differently: Take 0.25 mg by mouth daily as needed for anxiety or sleep. 07/04/23 10/02/23 Yes Hisada, Barbee Cough, MD  Melatonin 10 MG TABS Take 10 mg by mouth at bedtime.   Yes [provider]  NOVOLOG 100 UNIT/ML injection Inject into the skin continuous. Sliding Scale Insulin pump 2.5  units basal Bolus with meal depending on the size 03/15/22  Yes [provider]  ondansetron (ZOFRAN-ODT) 4 MG disintegrating tablet Take 4 mg by mouth every 8 (eight) hours as needed for nausea or vomiting. 03/17/22  Yes [provider]  OVER THE COUNTER MEDICATION Apply 1 application  topically at bedtime as needed (Sleep). CBD oil   Yes [provider]  OXYGEN Inhale 3 L into the lungs at bedtime.   Yes [provider]  rosuvastatin (CRESTOR) 10 MG tablet Take 1 tablet (10 mg total) by mouth daily. Patient taking differently: Take 10 mg by mouth every evening. 07/29/23 10/27/23 Yes Jodelle Red, MD  sevelamer carbonate (RENVELA) 800 MG tablet Take 1,600 mg by mouth 3 (three) times daily with meals. 07/28/23  Yes [provider]  torsemide (DEMADEX) 20 MG tablet TAKE TWO TABLETS BY MOUTH AS DIRECTED ON non dialysis DAYS   Yes [provider]  Continuous Blood Gluc Sensor MISC 1 each by Does not apply route as directed. Use as directed every 14 days. May dispense FreeStyle [employer] or similar.     [provider]     Family History  Problem Relation Age of Onset   Asthma Mother    Bipolar disorder Mother    Heart disease Father    Lymphoma Father    Hypertension Father    Thyroid disease Father    Hyperlipidemia Father    Diabetes Father    Cancer Paternal Grandmother        lung and breast   Bladder Cancer Paternal Grandfather    Suicidality Maternal Grandfather    Thyroid disease Brother     Social History   Socioeconomic History   Marital status: Married    Spouse name: Not on file   Number of children: 0   Years of education: 12   Highest education level: Not on file  Occupational History   Occupation: Disabled  Tobacco Use   Smoking status: Former    Current packs/day: 0.00    Average packs/day: 0.8 packs/day for 20.0 years (15.0 ttl pk-yrs)    Types: Cigarettes    Start date: 06/08/1991    Quit date: 06/08/2011    Years since quitting: 12.2   Smokeless tobacco: Never  Vaping Use   Vaping status: Never Used  Substance and Sexual Activity   Alcohol use: No   Drug use: No   Sexual activity: Yes    Birth control/protection: Post-menopausal  Other Topics Concern   Not on file  Social History Narrative   Lives at home with husband.   Right-handed.   Occasional caffeine use.   Social Drivers of Corporate investment banker Strain: Low Risk  (12/22/2022)   [facility]   Overall Financial Resource Strain (CARDIA)    Difficulty of Paying Living Expenses: Not hard at all  Food Insecurity: No Food Insecurity (09/05/2023)   Hunger Vital Sign    Worried About Running Out of Food in the Last Year: Never true    Ran Out of Food in the Last Year: Never true  Transportation Needs: No Transportation Needs (09/05/2023)   PRAPARE - Administrator, Civil Service (Medical): No    Lack of Transportation (Non-Medical): No  Physical Activity: Inactive (01/23/2019)   Exercise Vital Sign    Days of Exercise per Week: 0 days     Minutes of Exercise per Session: 0 min  Stress: Stress Concern Present (01/23/2019)  [employer] of Occupational Health - Occupational Stress Questionnaire    Feeling of Stress : Very much  Social Connections: Unknown (10/19/2021)   Received from [facility]   Social Network    Social Network: Not on file    Review of Systems: A 12 point ROS discussed and pertinent positives are indicated in the HPI above.  All other systems are negative.  Review of Systems  Constitutional:  Negative for fatigue and fever.  HENT:  Negative for congestion.   Respiratory:  Negative for cough and shortness of breath.   Gastrointestinal:  Negative for abdominal pain, diarrhea, nausea and vomiting.    Vital Signs: BP (!) 148/61   Pulse 67   Temp 98.1 F (36.7 C) (Oral)   Resp 20   Ht 5\' 9"  (1.753 m)   Wt 261 lb 11 oz (118.7 kg)   LMP 03/23/2017 (Approximate)   SpO2 99%   BMI 38.64 kg/m     Physical Exam Vitals and nursing note reviewed.  Constitutional:      Appearance: She is well-developed. She is obese.  HENT:     Head: Normocephalic and atraumatic.     Mouth/Throat:     Mouth: Mucous membranes are moist.  Eyes:     Conjunctiva/sclera: Conjunctivae normal.  Cardiovascular:     Rate and Rhythm: Normal rate and regular rhythm.  Pulmonary:     Effort: Pulmonary effort is normal.  Musculoskeletal:        General: Normal range of motion.     Cervical back: Normal range of motion.  Skin:    General: Skin is warm and dry.  Neurological:     General: No focal deficit present.     Mental Status: She is alert and oriented to person, place, and time. Mental status is at baseline.  Psychiatric:        Mood and Affect: Mood normal.        Behavior: Behavior normal.        Thought Content: Thought content normal.        Judgment: Judgment normal.     Imaging: ECHO TEE Result Date: 09/08/2023    TRANSESOPHOGEAL ECHO REPORT   Patient Name:   RAHMA MELLER Date  of Exam: 09/08/2023 Medical Rec #:  [id number]         Height:       69.0 in Accession #:    [id number]        Weight:       261.7 lb Date of Birth:  08-Aug-1970         BSA:          2.316 m Patient Age:    52 years          BP:           182/74 mmHg Patient Gender: F                 HR:           72 bpm. [facility] Procedure: Transesophageal Echo, Cardiac Doppler and Color Doppler (Both            Spectral and Color Flow Doppler were utilized during procedure). Indications:    Bacteremia R78.81  History:        Patient has prior history of Echocardiogram examinations, most                 recent 09/07/2023. CAD; Risk Factors:Hypertension, Diabetes,  Dyslipidemia and Former Smoker. ESRD on dailysis.  Sonographer:    Celesta Gentile RCS Referring Phys: [id number] Basilio Cairo PROCEDURE: The transesophogeal probe was passed without difficulty through the esophogus of the patient. Sedation performed by different physician. Image quality was excellent. The patient developed no complications during the procedure.  IMPRESSIONS  1. Left ventricular ejection fraction, by estimation, is 60 to 65%. The left ventricle has normal function.  2. Right ventricular systolic function is normal. The right ventricular size is normal.  3. Left atrial size was mildly dilated. No left atrial/left atrial appendage thrombus was detected. The LAA emptying velocity was 60 cm/s.  4. The mitral valve is normal in structure. Trivial mitral valve regurgitation. No evidence of mitral stenosis.  5. The aortic valve is tricuspid. Aortic valve regurgitation is not visualized. No aortic stenosis is present. Conclusion(s)/Recommendation(s): No evidence of vegetation/infective endocarditis on this transesophageael echocardiogram. FINDINGS  Left Ventricle: Left ventricular ejection fraction, by estimation, is 60 to 65%. The left ventricle has normal function. The left ventricular internal cavity size was normal in size. Right  Ventricle: The right ventricular size is normal. No increase in right ventricular wall thickness. Right ventricular systolic function is normal. Left Atrium: Left atrial size was mildly dilated. No left atrial/left atrial appendage thrombus was detected. The LAA emptying velocity was 60 cm/s. Right Atrium: Right atrial size was normal in size. Pericardium: There is no evidence of pericardial effusion. Mitral Valve: The mitral valve is normal in structure. Trivial mitral valve regurgitation. No evidence of mitral valve stenosis. Tricuspid Valve: The tricuspid valve is normal in structure. Tricuspid valve regurgitation is mild . No evidence of tricuspid stenosis. Aortic Valve: The aortic valve is tricuspid. Aortic valve regurgitation is not visualized. No aortic stenosis is present. Pulmonic Valve: The pulmonic valve was normal in structure. Pulmonic valve regurgitation is not visualized. No evidence of pulmonic stenosis. Aorta: The aortic root is normal in size and structure. IAS/Shunts: No atrial level shunt detected by color flow Doppler.   AORTA Ao Root diam: 2.90 cm Dina Rich MD Electronically signed by Dina Rich MD Signature Date/Time: 09/08/2023/3:25:16 PM    Final    ECHOCARDIOGRAM COMPLETE Result Date: 09/07/2023    ECHOCARDIOGRAM REPORT   Patient Name:   ALARA DANIEL Date of Exam: 09/07/2023 Medical Rec #:  [id number]         Height:       69.0 in Accession #:    [id number]        Weight:       261.7 lb Date of Birth:  [birth year]         BSA:          2.316 m Patient Age:    52 years          BP:           108/42 mmHg Patient Gender: F                 HR:           77 bpm. [facility] Procedure: 2D Echo, Cardiac Doppler, Color Doppler and Intracardiac            Opacification Agent (Both Spectral and Color Flow Doppler were            utilized during procedure). Indications:    Bacteremia R78.81  History:        Patient has prior history of Echocardiogram examinations, most  recent 07/15/2023. CAD; Risk Factors:Hypertension, Diabetes,                 Dyslipidemia and Former Smoker. ESRD on dailysis.  Sonographer:    Celesta Gentile RCS Referring Phys: [phone] Tyrone Nine IMPRESSIONS  1. Left ventricular ejection fraction, by estimation, is 55 to 60%. The left ventricle has normal function. The left ventricle has no regional wall motion abnormalities. Left ventricular diastolic parameters were normal.  2. Right ventricular systolic function is normal. The right ventricular size is normal.  3. The mitral valve is normal in structure. Trivial mitral valve regurgitation. No evidence of mitral stenosis.  4. The tricuspid valve is abnormal.  5. The aortic valve has an indeterminant number of cusps. Aortic valve regurgitation is not visualized. No aortic stenosis is present.  6. The inferior vena cava is normal in size with greater than 50% respiratory variability, suggesting right atrial pressure of 3 mmHg. FINDINGS  Left Ventricle: Left ventricular ejection fraction, by estimation, is 55 to 60%. The left ventricle has normal function. The left ventricle has no regional wall motion abnormalities. Definity contrast agent was given IV to delineate the left ventricular  endocardial borders. The left ventricular internal cavity size was normal in size. There is no left ventricular hypertrophy. Left ventricular diastolic parameters were normal. Right Ventricle: The right ventricular size is normal. Right vetricular wall thickness was not well visualized. Right ventricular systolic function is normal. Left Atrium: Left atrial size was normal in size. Right Atrium: Right atrial size was normal in size. Pericardium: There is no evidence of pericardial effusion. Mitral Valve: The mitral valve is normal in structure. There is mild thickening of the mitral valve leaflet(s). There is mild calcification of the mitral valve leaflet(s). Mild mitral annular calcification. Trivial mitral valve  regurgitation. No evidence  of mitral valve stenosis. Tricuspid Valve: The tricuspid valve is abnormal. Tricuspid valve regurgitation is mild . No evidence of tricuspid stenosis. Aortic Valve: The aortic valve has an indeterminant number of cusps. Aortic valve regurgitation is not visualized. No aortic stenosis is present. Aortic valve mean gradient measures 7.0 mmHg. Aortic valve peak gradient measures 13.2 mmHg. Aortic valve area, by VTI measures 2.61 cm. Pulmonic Valve: The pulmonic valve was not well visualized. Pulmonic valve regurgitation is not visualized. No evidence of pulmonic stenosis. Aorta: The aortic root is normal in size and structure and the ascending aorta was not well visualized. Venous: The inferior vena cava is normal in size with greater than 50% respiratory variability, suggesting right atrial pressure of 3 mmHg. IAS/Shunts: No atrial level shunt detected by color flow Doppler.  LEFT VENTRICLE PLAX 2D LVIDd:         5.10 cm   Diastology LVIDs:         2.80 cm   LV e' medial:    7.29 cm/s LV PW:         1.00 cm   LV E/e' medial:  19.5 LV IVS:        1.00 cm   LV e' lateral:   12.30 cm/s LVOT diam:     2.10 cm   LV E/e' lateral: 11.5 LV SV:         112 LV SV Index:   48 LVOT Area:     3.46 cm  RIGHT VENTRICLE RV S prime:     15.20 cm/s TAPSE (M-mode): 2.0 cm LEFT ATRIUM             Index  RIGHT ATRIUM           Index LA diam:        4.70 cm 2.03 cm/m   RA Area:     12.10 cm LA Vol (A2C):   71.3 ml 30.79 ml/m  RA Volume:   27.00 ml  11.66 ml/m LA Vol (A4C):   71.2 ml 30.74 ml/m LA Biplane Vol: 72.6 ml 31.35 ml/m  AORTIC VALVE AV Area (Vmax):    2.71 cm AV Area (Vmean):   2.57 cm AV Area (VTI):     2.61 cm AV Vmax:           182.00 cm/s AV Vmean:          125.000 cm/s AV VTI:            0.429 m AV Peak Grad:      13.2 mmHg AV Mean Grad:      7.0 mmHg LVOT Vmax:         142.50 cm/s LVOT Vmean:        92.800 cm/s LVOT VTI:          0.324 m LVOT/AV VTI ratio: 0.75  AORTA Ao Root  diam: 3.30 cm MITRAL VALVE                TRICUSPID VALVE MV Area (PHT): 3.12 cm     TR Peak grad:   26.0 mmHg MV Decel Time: 243 msec     TR Vmax:        255.00 cm/s MV E velocity: 142.00 cm/s MV A velocity: 115.00 cm/s  SHUNTS MV E/A ratio:  1.23         Systemic VTI:  0.32 m                             Systemic Diam: 2.10 cm Dina Rich MD Electronically signed by Dina Rich MD Signature Date/Time: 09/07/2023/3:34:00 PM    Final    DG Chest Fluoro Result Date: 09/07/2023 INDICATION: Patient with history of ESRD on HD via right chest HD catheter placed August 2024 at outside hospital, currently admitted for sepsis with concern for HD catheter as source. Request for removal of tunneled HD catheter for line holiday. EXAM: REMOVAL OF TUNNELED HEMODIALYSIS CATHETER WITHOUT IMAGE GUIDANCE MEDICATIONS: 7 mL 1% lidocaine COMPLICATIONS: None immediate. PROCEDURE: Informed written consent was obtained from the patient following an explanation of the procedure, risks, benefits and alternatives to treatment. A time out was performed prior to the initiation of the procedure. Maximal barrier sterile technique was utilized including caps, mask, sterile gowns, sterile gloves, large sterile drape, hand hygiene, and ChloraPrep. 1% lidocaine was injected under sterile conditions along the subcutaneous tunnel. Utilizing a combination of blunt dissection and gentle traction, the catheter was removed intact. Hemostasis was obtained with manual compression. A dressing was placed. The patient tolerated the procedure well without immediate post procedural complication. IMPRESSION: Successful removal of a RIGHT chest tunneled dialysis catheter. Tip sent for culture per request of ordering provider. Performed by Lynnette Caffey, PA-C Electronically Signed   By: Roanna Banning M.D.   On: 09/07/2023 12:31   DG Chest 2 View Result Date: 09/05/2023 CLINICAL DATA:  Cough, hypoxia EXAM: CHEST - 2 VIEW COMPARISON:  Radiograph  07/09/2023 FINDINGS: Large-bore central venous line unchanged. Stable cardiac silhouette. There is interval increase in perihilar airspace densities. Low lung volumes. No pneumothorax. IMPRESSION: New perihilar airspace densities suggest pulmonary edema. Electronically  Signed   By: Genevive Bi M.D.   On: 09/05/2023 12:24   CARDIAC CATHETERIZATION Result Date: 08/30/2023   Mid LAD to Dist LAD lesion is 80% stenosed.   Prox RCA to Mid RCA lesion is 70% stenosed.   1st Diag lesion is 80% stenosed.   A stent was successfully placed.   A stent was successfully placed.   A stent was successfully placed.   Post intervention, there is a 0% residual stenosis.   Post intervention, there is a 0% residual stenosis.   Post intervention, there is a 0% residual stenosis. 1.  Low syntax score multivessel disease treated involving the first diagonal, mid to distal LAD, and mid right coronary artery. 2.  DES x 1 first diagonal. 3.  DES x 2 mid to distal LAD with OCT imaging optimization. 4.  DES x 1 mid RCA. 5.  LVEDP of 33 mmHg. Recommendation: Same-day discharge after PCI with 6 hours bedrest.  Dual antiplatelet therapy for preferably 6 months and then Plavix monotherapy indefinitely.    Labs:  CBC: Recent Labs    09/05/23 1057 09/06/23 0512 09/07/23 0431 09/09/23 0430  WBC 14.4* 17.9* 10.6* 7.4  HGB 10.8* 9.4* 9.7* 10.1*  HCT 34.0* 30.6* 30.7* 32.2*  PLT 203 165 198 247    COAGS: Recent Labs    09/05/23 1057  INR 1.0  APTT 22*    BMP: Recent Labs    09/05/23 1057 09/06/23 0512 09/07/23 0431 09/09/23 0430  NA 133* 131* 133* 135  K 3.8 4.1 3.7 3.4*  CL 97* 96* 94* 99  CO2 23 25 28 27   GLUCOSE 155* 114* 106* 119*  BUN 34* 48* 28* 37*  CALCIUM 9.3 9.3 8.9 9.0  CREATININE 5.55* 6.35* 3.98* 5.16*  GFRNONAA 9* 7* 13* 10*    LIVER FUNCTION TESTS: Recent Labs    07/09/23 1258 08/15/23 0000 09/05/23 1057 09/09/23 0430  BILITOT 0.7 <0.2 0.6 0.4  AST 19 11 73* 12*  ALT 21 10 38 18   ALKPHOS 94 146* 118 88  PROT 6.1* 5.9* 6.4* 6.2*  ALBUMIN 3.1* 3.9 2.9* 2.4*     Assessment and Plan:  53 y.o. female inpatient. History of IBS, GERD,  DM, CAD s/p PCI on 3.25.25, HLD, CHF, ESRD on HD. Presented to the  ED at [facility] on 3.31.25 with generalized weakness. Found to be septic (MRSA bacteremia) secondary to a left thigh abscess. RIJ TDC was removed on 4.2.25 for a line holiday. Cultures of the tip grew staph aureus.Plan is for general surgery to place a temporary dialysis catheter on 4.5.25 followed by a tunneled hemodialysis placement by IR prior to patient discharge  PLAN: IR Image Guided Tunneled HD Catheter Placement. Tentatively scheduled for 4.7.25   Team instructed to: Keep Patient to be NPO after midnight Hold prophylactic anticoagulation 24 hours prior to scheduled procedure. Coordinate transfer to and from [facility] arrival time of 9 am. RN made aware.   Risks and benefits discussed with the patient including, but not limited to bleeding, infection, vascular injury, pneumothorax which may require chest tube placement, air embolism or even death  All of the patient's questions were answered, patient is agreeable to proceed. Consent signed and in chart.   Thank you for this interesting consult.  I greatly enjoyed meeting DAIYA TAMER and look forward to participating in their care.  A copy of this report was sent to the requesting provider on this date.  Electronically Signed: Alene Mires, NP  09/09/2023, 8:29 AM   I spent a total of 20 Minutes    in face to face in clinical consultation, greater than 50% of which was counseling/coordinating care for [facility] placement

## 2023-09-10 ENCOUNTER — Inpatient Hospital Stay (HOSPITAL_COMMUNITY)

## 2023-09-10 DIAGNOSIS — A4102 Sepsis due to Methicillin resistant Staphylococcus aureus: Secondary | ICD-10-CM | POA: Diagnosis not present

## 2023-09-10 DIAGNOSIS — J9601 Acute respiratory failure with hypoxia: Secondary | ICD-10-CM | POA: Diagnosis not present

## 2023-09-10 DIAGNOSIS — R6520 Severe sepsis without septic shock: Secondary | ICD-10-CM | POA: Diagnosis not present

## 2023-09-10 HISTORY — PX: IR FLUORO GUIDE CV LINE LEFT: IMG2282

## 2023-09-10 HISTORY — PX: IR US GUIDE VASC ACCESS LEFT: IMG2389

## 2023-09-10 LAB — RENAL FUNCTION PANEL
Albumin: 2.8 g/dL — ABNORMAL LOW (ref 3.5–5.0)
Anion gap: 19 — ABNORMAL HIGH (ref 5–15)
BUN: 36 mg/dL — ABNORMAL HIGH (ref 6–20)
CO2: 18 mmol/L — ABNORMAL LOW (ref 22–32)
Calcium: 9.9 mg/dL (ref 8.9–10.3)
Chloride: 98 mmol/L (ref 98–111)
Creatinine, Ser: 5.01 mg/dL — ABNORMAL HIGH (ref 0.44–1.00)
GFR, Estimated: 10 mL/min — ABNORMAL LOW (ref >60)
Glucose, Bld: 354 mg/dL — ABNORMAL HIGH (ref 70–99)
Phosphorus: 3.2 mg/dL (ref 2.5–4.6)
Potassium: 4.6 mmol/L (ref 3.5–5.1)
Sodium: 135 mmol/L (ref 135–145)

## 2023-09-10 LAB — GLUCOSE, CAPILLARY
Glucose-Capillary: 234 mg/dL — ABNORMAL HIGH (ref 70–99)
Glucose-Capillary: 241 mg/dL — ABNORMAL HIGH (ref 70–99)
Glucose-Capillary: 273 mg/dL — ABNORMAL HIGH (ref 70–99)
Glucose-Capillary: 336 mg/dL — ABNORMAL HIGH (ref 70–99)
Glucose-Capillary: 356 mg/dL — ABNORMAL HIGH (ref 70–99)

## 2023-09-10 LAB — CBC
HCT: 40.9 % (ref 36.0–46.0)
Hemoglobin: 13 g/dL (ref 12.0–15.0)
MCH: 29.5 pg (ref 26.0–34.0)
MCHC: 31.8 g/dL (ref 30.0–36.0)
MCV: 92.7 fL (ref 80.0–100.0)
Platelets: 295 10*3/uL (ref 150–400)
RBC: 4.41 MIL/uL (ref 3.87–5.11)
RDW: 12.6 % (ref 11.5–15.5)
WBC: 9.5 10*3/uL (ref 4.0–10.5)
nRBC: 0 % (ref 0.0–0.2)

## 2023-09-10 LAB — CATH TIP CULTURE: Culture: 30000 — AB

## 2023-09-10 MED ORDER — FENTANYL CITRATE (PF) 100 MCG/2ML IJ SOLN
INTRAMUSCULAR | Status: AC | PRN
  Administered 2023-09-10: 50 ug via INTRAVENOUS

## 2023-09-10 MED ORDER — HYDRALAZINE HCL 25 MG PO TABS
25.0000 mg | ORAL_TABLET | Freq: Four times a day (QID) | ORAL | Status: DC | PRN
  Administered 2023-09-10: 25 mg via ORAL
  Filled 2023-09-10: qty 1

## 2023-09-10 MED ORDER — MIDAZOLAM HCL 2 MG/2ML IJ SOLN
INTRAMUSCULAR | Status: AC
Start: 2023-09-10 — End: ?
  Filled 2023-09-10: qty 2

## 2023-09-10 MED ORDER — LIDOCAINE-EPINEPHRINE 1 %-1:100000 IJ SOLN
INTRAMUSCULAR | Status: AC
  Filled 2023-09-10: qty 1

## 2023-09-10 MED ORDER — MIDAZOLAM HCL 2 MG/2ML IJ SOLN
INTRAMUSCULAR | Status: AC | PRN
  Administered 2023-09-10: 1 mg via INTRAVENOUS

## 2023-09-10 MED ORDER — HEPARIN SODIUM (PORCINE) 1000 UNIT/ML IJ SOLN
INTRAMUSCULAR | Status: AC
Start: 2023-09-10 — End: ?
  Filled 2023-09-10: qty 10

## 2023-09-10 MED ORDER — FENTANYL CITRATE (PF) 100 MCG/2ML IJ SOLN
INTRAMUSCULAR | Status: AC
  Filled 2023-09-10: qty 2

## 2023-09-10 MED ORDER — HEPARIN SODIUM (PORCINE) 1000 UNIT/ML DIALYSIS: 1000.0000 [IU] | INTRAMUSCULAR | Status: DC | PRN

## 2023-09-10 MED ORDER — HEPARIN SODIUM (PORCINE) 1000 UNIT/ML IJ SOLN
10.0000 mL | Freq: Once | INTRAMUSCULAR | Status: AC
  Administered 2023-09-10: 3.2 mL
  Filled 2023-09-10: qty 10

## 2023-09-10 MED ORDER — CEFAZOLIN SODIUM-DEXTROSE 2-4 GM/100ML-% IV SOLN
INTRAVENOUS | Status: AC
  Filled 2023-09-10: qty 100

## 2023-09-10 MED ORDER — HEPARIN SODIUM (PORCINE) 1000 UNIT/ML IJ SOLN
INTRAMUSCULAR | Status: AC
  Administered 2023-09-10: 3200 [IU]
  Filled 2023-09-10: qty 4

## 2023-09-10 MED ORDER — CEFAZOLIN SODIUM-DEXTROSE 2-4 GM/100ML-% IV SOLN
2.0000 g | Freq: Once | INTRAVENOUS | Status: AC
  Administered 2023-09-10: 2 g via INTRAVENOUS

## 2023-09-10 MED ORDER — LIDOCAINE-EPINEPHRINE 1 %-1:100000 IJ SOLN
20.0000 mL | Freq: Once | INTRAMUSCULAR | Status: AC
  Administered 2023-09-10: 20 mL
  Filled 2023-09-10: qty 20

## 2023-09-10 NOTE — Progress Notes (Addendum)
 [facility] Progress Note  Subjective:  Seen in HD [facility] in L chest   Vitals:   09/10/23 1100 09/10/23 1105 09/10/23 1110 09/10/23 1115  BP: (!) 203/90 (!) 201/95 (!) 208/91 (!) 144/126  Pulse: 97 95 94 94  Resp: (!) 21 (!) 21 (!) 27 (!) 23  Temp:      TempSrc:      SpO2: 98% 96% 98% 93%  Weight:      Height:        Exam:  alert, nad   no jvd  Chest cta bilat  Cor reg no RG  Abd soft ntnd no ascites   Ext no LE edema   Alert, NF, ox3    [facility] TDC intact    OP HD: [facility]  4h   117.5kg    3K bath  RDC  B400  Heparin 2500 Mircera 75 mcg q 4 wks Venofer 50mg  weekly   Assessment/ Plan: Sepsis - 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  On IV Vancomycin. ID recommends line holiday. TDC removed on 4/2, then replaced this am w/ TDC per IR at [facility]. The blood culture has been negative so far.  Pt is okay for dc after HD from a renal standpoint. She will get 1 gm IV vanc w/ HD thru April 30 (4 wks from 4/02).  ESRD -last HD on 4/1, on HD now today. Resume TTS at OP unit post discharge.  Hypertension/volume  - stable and will UF to edw as bp tolerates Anemia esrd  -  hgb at goal.  Continue erythropoietin.  Transfuse for Hgb <7, hold off on IV iron given bacteremia Metabolic bone disease -  continue with home meds  Acute on chronic hypoxic respiratory failure - UF w/ HD today, get weights post HD   Vinson Moselle MD  [facility] 09/10/2023, 12:38 PM  Recent Labs  Lab 09/05/23 1057 09/06/23 0512 09/07/23 0431 09/09/23 0430  HGB 10.8*   < > 9.7* 10.1*  ALBUMIN 2.9*  --   --  2.4*  CALCIUM 9.3   < > 8.9 9.0  CREATININE 5.55*   < > 3.98* 5.16*  K 3.8   < > 3.7 3.4*   < > = values in this interval not displayed.   No results for input(s): "IRON", "TIBC", "FERRITIN" in the last 168 hours. Inpatient medications:  aspirin EC  81 mg Oral QHS   buPROPion  300 mg Oral QPM   Chlorhexidine Gluconate Cloth  6 each Topical Q0600   Chlorhexidine  Gluconate Cloth  6 each Topical Q0600   Chlorhexidine Gluconate Cloth  6 each Topical Q0600   clopidogrel  75 mg Oral Daily   darbepoetin (ARANESP) injection - DIALYSIS  60 mcg Subcutaneous Q Fri-1800   folic acid  1 mg Oral q AM   insulin aspart  0-9 Units Subcutaneous Q4H   insulin aspart  2 Units Subcutaneous TID WC   insulin glargine-yfgn  10 Units Subcutaneous QHS   lamoTRIgine  200 mg Oral QPM   levothyroxine  175 mcg Oral Q0600   melatonin  9 mg Oral QHS   midazolam  2 mg Intravenous Once   rosuvastatin  10 mg Oral QPM   sevelamer carbonate  1,600 mg Oral TID WC   sodium chloride flush  3 mL Intravenous Q12H    vancomycin     acetaminophen **OR** acetaminophen, albuterol, alum & mag hydroxide-simeth, diclofenac Sodium, hydrALAZINE, LORazepam, ondansetron **  OR** ondansetron (ZOFRAN) IV, mouth rinse, oxyCODONE-acetaminophen

## 2023-09-10 NOTE — Progress Notes (Signed)
 Patient's BP 193/90 HR 73. MD,Opyd made aware. Order received. Will continue to monitor.

## 2023-09-10 NOTE — Procedures (Addendum)
 Interventional Radiology Procedure Note  Procedure: Placement of a left IJ approach tunneled HD cath.  Tip is positioned at the superior cavoatrial junction and catheter is ready for immediate use.   Complications: None Recommendations:  - Ok to shower tomorrow - Do not submerge - Routine line care  - ok to advance diet per primary order  Signed,  Yvone Neu. Loreta Ave, DO, ABVM, RPVI

## 2023-09-10 NOTE — Discharge Summary (Signed)
 Physician Discharge Summary  [name]:[id number] DOB: 1970/06/23 DOA: 09/05/2023  PCP: Richmond Campbell., PA-C  Admit date: 09/05/2023 Discharge date: 09/11/2023  Admitted From: Home  Discharge disposition: home    Recommendations for Outpatient Follow-Up:   Follow up with your primary care provider in one week.  Check CBC, BMP, magnesium in the next visit Follow-up with cardiology on 09/14/2023. Follow-up with infectious disease clinic on 10/11/2023 at 2:30 PM. Continue hemodialysis as outpatient.   Discharge Diagnosis:   Principal Problem:   Sepsis due to methicillin resistant Staphylococcus aureus (MRSA) (HCC) Active Problems:   Essential hypertension   Acquired hypothyroidism   Bipolar II disorder (HCC)   Chronic migraine w/o aura w/o status migrainosus, not intractable   GERD (gastroesophageal reflux disease)   Other hyperlipidemia   Elevated troponin   Asthma   CAD (coronary artery disease)   End-stage renal disease on hemodialysis (HCC)   DKA, type 1 (HCC)   Myocardial injury   Abscess of right thigh   MRSA bacteremia   Bacteremia   Discharge Condition: Improved.  Diet recommendation: Low-sodium diet, diabetic diet   Wound care: None.  Code status: Full.  History of Present Illness:   Jasmine [name] is a 53 y.o. female with a history of T1DM on insulin pump, CAD s/p DES x4 on 08/30/2023, ESRD (on HD TTS thru right IJ TDC), OSA on 3L nocturnal O2, obesity, HTN, HLD, bipolar disorder, IBS and recent I&D of right thigh abscess (3/28) presented to the hospital brought in by EMS from home with feeling ill, weak, fatigued starting the night before associated with fever of 101.43F and shaking chills.  In the ED patient was febrile with leukocytosis and tachypnea was noted.  Was placed on supplemental oxygen as below.  Lactate was normal.  Troponin was 273 without EKG changes.  Chest x-ray showed right hilar opacity.  Antibiotics with started blood  cultures were drawn and patient was admitted hospital. Blood cultures have grown MRSA at this time.   Hospital Course:   Following conditions were addressed during hospitalization as listed below,  MRSA bacteremia and sepsis -in the setting of right thigh abscess -received Rocephin and doxycycline initially.  TTE on 09-07-23 with no endocarditis and subsequently had TEE on 09/08/2023 without any vegetation.  On vancomycin with hemodialysis started 09/06/2023.  Right internal jugular tunneled hemodialysis catheter removed on 09/07/23 to allow for line holiday.  Repeat blood cultures from 09/17/2023 negative.  Plan is to continue vancomycin 1 g Tuesday Thursday Saturday for 4 weeks starting negative cultures from 4-2-255.  Seen by ID during hospitalization.Marland Kitchen   CAD s/p DES x4 08/30/2023 with troponin elevation, HTN, HLD:  - Continue DAPT, Crestor, Coreg and hydralazine.  Cardiology was consulted.  Plan to follow-up with cardiology as outpatient on 09/14/2023.   Acute on chronic hypoxic respiratory failure (nocturnal O2 chronically):  At baseline.  ESRD: TTS schedule.  Seen by nephrology.  Status post IR guided left internal jugular tunneled hemodialysis catheter on 09/10/2023 . Patient [facility] be [facility] after hemodialysis on 09/10/2023   MRSA Right thigh abscess:   - Repeat I&D in ED 09/05/23, continue basic local wound care outpatient antibiotic.   DM1--last A1c 5.6  - Insulin pump ran out of insulin, currently on Semglee at reduced dose.  Plan is to continue insulin pump at discharge.  Bipolar disorder:  - Continue home medications including lamictal, wellbutrin, cymbalta, on discharge.   Hypothyroidism:  - Continue  synthroid at dosing, typically takes 1.5x dose on Sundays.   Disposition.  At this time, patient is stable for disposition home with outpatient PCP, nephrology and ID follow-up.  Medical Consultants:   Interventional radiology Nephrology Infectious  disease  Procedures:   Left internal jugular tunneled hemodialysis catheter by interventional radiology on 09/10/2023 Hemodialysis Removal of the right internal jugular tunneled hemodialysis catheter on 09-07-23 Subjective:   Today, patient was seen and examined at bedside.  No interval complaints reported.  Had hemodialysis late yesterday so could not go home.  Discharge Exam:   Vitals:   09/11/23 0508 09/11/23 0806  BP: (!) 156/75 136/72  Pulse: 90 87  Resp: 14 18  Temp: 97.8 F (36.6 C) (!) 97.5 F (36.4 C)  SpO2: 95% 96%   Vitals:   09/10/23 2130 09/10/23 2215 09/11/23 0508 09/11/23 0806  BP:  128/67 (!) 156/75 136/72  Pulse:  88 90 87  Resp:  16 14 18   Temp:  97.6 F (36.4 C) 97.8 F (36.6 C) (!) 97.5 F (36.4 C)  TempSrc:  Oral Oral Oral  SpO2:  96% 95% 96%  Weight: 114.6 kg     Height:       Body mass index is 37.31 kg/m.  General: Alert awake, not in obvious distress, obese built HENT: pupils equally reacting to light,  No scleral pallor or icterus noted. Oral mucosa is moist.  Left internal jugular hemodialysis catheter in place. Chest:   Diminished breath sounds bilaterally. No crackles or wheezes.  CVS: S1 &S2 heard. No murmur.  Regular rate and rhythm. Abdomen: Soft, nontender, nondistended.  Bowel sounds are heard.   Extremities: No cyanosis, clubbing or edema.  Peripheral pulses are palpable.  Right thigh with dressing. Psych: Alert, awake and oriented, normal mood CNS:  No cranial nerve deficits.  Moves all extremities. Skin: Warm and dry.  Right thigh with dressing.  The results of significant diagnostics from this hospitalization (including imaging, microbiology, ancillary and laboratory) are listed below for reference.     Diagnostic Studies:   DG Chest 2 View Result Date: 09/05/2023 CLINICAL DATA:  Cough, hypoxia EXAM: CHEST - 2 VIEW COMPARISON:  Radiograph 07/09/2023 FINDINGS: Large-bore central venous line unchanged. Stable cardiac silhouette.  There is interval increase in perihilar airspace densities. Low lung volumes. No pneumothorax. IMPRESSION: New perihilar airspace densities suggest pulmonary edema. Electronically Signed   By: Genevive Bi M.D.   On: 09/05/2023 12:24     Labs:   Basic Metabolic Panel: Recent Labs  Lab 09/05/23 1057 09/06/23 0512 09/07/23 0431 09/09/23 0430 09/10/23 1148  NA 133* 131* 133* 135 135  K 3.8 4.1 3.7 3.4* 4.6  CL 97* 96* 94* 99 98  CO2 23 25 28 27  18*  GLUCOSE 155* 114* 106* 119* 354*  BUN 34* 48* 28* 37* 36*  CREATININE 5.55* 6.35* 3.98* 5.16* 5.01*  CALCIUM 9.3 9.3 8.9 9.0 9.9  PHOS  --   --   --   --  3.2   GFR Estimated Creatinine Clearance: 18 mL/min (A) (by C-G formula based on SCr of 5.01 mg/dL (H)). Liver Function Tests: Recent Labs  Lab 09/05/23 1057 09/09/23 0430 09/10/23 1148  AST 73* 12*  --   ALT 38 18  --   ALKPHOS 118 88  --   BILITOT 0.6 0.4  --   PROT 6.4* 6.2*  --   ALBUMIN 2.9* 2.4* 2.8*   No results for input(s): "LIPASE", "AMYLASE" in the last 168 hours.  No results for input(s): "AMMONIA" in the last 168 hours. Coagulation profile Recent Labs  Lab 09/05/23 1057  INR 1.0    CBC: Recent Labs  Lab 09/05/23 1057 09/06/23 0512 09/07/23 0431 09/09/23 0430 09/10/23 1148  WBC 14.4* 17.9* 10.6* 7.4 9.5  NEUTROABS 13.1*  --   --   --   --   HGB 10.8* 9.4* 9.7* 10.1* 13.0  HCT 34.0* 30.6* 30.7* 32.2* 40.9  MCV 95.2 97.1 95.9 94.7 92.7  PLT 203 165 198 247 295   Cardiac Enzymes: No results for input(s): "CKTOTAL", "CKMB", "CKMBINDEX", "TROPONINI" in the last 168 hours. BNP: Invalid input(s): "POCBNP" CBG: Recent Labs  Lab 09/10/23 0754 09/10/23 1132 09/10/23 2210 09/11/23 0513 09/11/23 0807  GLUCAP 336* 356* 234* 384* 234*   D-Dimer No results for input(s): "DDIMER" in the last 72 hours. Hgb A1c No results for input(s): "HGBA1C" in the last 72 hours. Lipid Profile No results for input(s): "CHOL", "HDL", "LDLCALC", "TRIG",  "CHOLHDL", "LDLDIRECT" in the last 72 hours. Thyroid function studies No results for input(s): "TSH", "T4TOTAL", "T3FREE", "THYROIDAB" in the last 72 hours.  Invalid input(s): "FREET3" Anemia work up No results for input(s): "VITAMINB12", "FOLATE", "FERRITIN", "TIBC", "IRON", "RETICCTPCT" in the last 72 hours. Microbiology Recent Results (from the past 240 hours)  Resp panel by RT-PCR (RSV, Flu A&B, Covid) Anterior Nasal Swab     Status: None   Collection Time: 09/05/23 10:32 AM   Specimen: Anterior Nasal Swab  Result Value Ref Range Status   SARS Coronavirus 2 by RT PCR NEGATIVE NEGATIVE Final    Comment: (NOTE) SARS-CoV-2 target nucleic acids are NOT DETECTED.  The SARS-CoV-2 RNA is generally detectable in upper respiratory specimens during the acute phase of infection. The lowest concentration of SARS-CoV-2 viral copies this assay can detect is 138 copies/mL. A negative result does not preclude SARS-Cov-2 infection and should not be used as the sole basis for treatment or other patient management decisions. A negative result may occur with  improper specimen collection/handling, submission of specimen other than nasopharyngeal swab, presence of viral mutation(s) within the areas targeted by this assay, and inadequate number of viral copies(<138 copies/mL). A negative result must be combined with clinical observations, patient history, and epidemiological information. The expected result is Negative.  Fact Sheet for Patients:  BloggerCourse.com  Fact Sheet for Healthcare Providers:  SeriousBroker.it  This test is no t yet approved or cleared by the [place] FDA and  has been authorized for detection and/or diagnosis of SARS-CoV-2 by FDA under an Emergency Use Authorization (EUA). This EUA will remain  in effect (meaning this test can be used) for the duration of the COVID-19 declaration under Section 564(b)(1) of the Act,  21 U.S.C.section 360bbb-3(b)(1), unless the authorization is terminated  or revoked sooner.       Influenza A by PCR NEGATIVE NEGATIVE Final   Influenza B by PCR NEGATIVE NEGATIVE Final    Comment: (NOTE) The Xpert Xpress SARS-CoV-2/FLU/RSV plus assay is intended as an aid in the diagnosis of influenza from Nasopharyngeal swab specimens and should not be used as a sole basis for treatment. Nasal washings and aspirates are unacceptable for Xpert Xpress SARS-CoV-2/FLU/RSV testing.  Fact Sheet for Patients: BloggerCourse.com  Fact Sheet for Healthcare Providers: SeriousBroker.it  This test is not yet approved or cleared by the [place] FDA and has been authorized for detection and/or diagnosis of SARS-CoV-2 by FDA under an Emergency Use Authorization (EUA). This EUA will remain in effect (meaning this  test can be used) for the duration of the COVID-19 declaration under Section 564(b)(1) of the Act, 21 U.S.C. section 360bbb-3(b)(1), unless the authorization is terminated or revoked.     Resp Syncytial Virus by PCR NEGATIVE NEGATIVE Final    Comment: (NOTE) Fact Sheet for Patients: BloggerCourse.com  Fact Sheet for Healthcare Providers: SeriousBroker.it  This test is not yet approved or cleared by the [place] FDA and has been authorized for detection and/or diagnosis of SARS-CoV-2 by FDA under an Emergency Use Authorization (EUA). This EUA will remain in effect (meaning this test can be used) for the duration of the COVID-19 declaration under Section 564(b)(1) of the Act, 21 U.S.C. section 360bbb-3(b)(1), unless the authorization is terminated or revoked.  Performed at [facility], [address], [place], [place] [address]   Blood Culture (routine x 2)     Status: Abnormal   Collection Time: 09/05/23 10:57 AM   Specimen: BLOOD  Result Value Ref Range Status    Specimen Description   Final    BLOOD BLOOD LEFT ARM LAC Performed at [facility], [address] [place], [place] [address]    Special Requests   Final    BOTTLES DRAWN AEROBIC AND ANAEROBIC Blood Culture adequate volume Performed at [facility], [address], [place], [place] [address]    Culture  Setup Time   Final    GRAM POSITIVE COCCI IN BOTH AEROBIC AND ANAEROBIC BOTTLES Gram Stain Report Called to,Read Back By and Verified With: Mitzie Na [id number], VIRAY,J CRITICAL RESULT CALLED TO, READ BACK BY AND VERIFIED WITH: B SUPER,RN@0513  09/06/23 MK Performed at [facility], [address] [place], [place] [address]    Culture METHICILLIN RESISTANT STAPHYLOCOCCUS AUREUS (A)  Final   Report Status 09/08/2023 FINAL  Final   Organism ID, Bacteria METHICILLIN RESISTANT STAPHYLOCOCCUS AUREUS  Final      Susceptibility   Methicillin resistant staphylococcus aureus - MIC*    CIPROFLOXACIN >=8 RESISTANT Resistant     ERYTHROMYCIN >=8 RESISTANT Resistant     GENTAMICIN <=0.5 SENSITIVE Sensitive     OXACILLIN >=4 RESISTANT Resistant     TETRACYCLINE <=1 SENSITIVE Sensitive     VANCOMYCIN 1 SENSITIVE Sensitive     TRIMETH/SULFA <=10 SENSITIVE Sensitive     CLINDAMYCIN <=0.25 SENSITIVE Sensitive     RIFAMPIN <=0.5 SENSITIVE Sensitive     Inducible Clindamycin NEGATIVE Sensitive     LINEZOLID 2 SENSITIVE Sensitive     * METHICILLIN RESISTANT STAPHYLOCOCCUS AUREUS  Blood Culture (routine x 2)     Status: Abnormal   Collection Time: 09/05/23 10:57 AM   Specimen: BLOOD  Result Value Ref Range Status   Specimen Description   Final    BLOOD BLOOD LEFT HAND Performed at [facility], [address], [place], [place] [address]    Special Requests   Final    BOTTLES DRAWN AEROBIC AND ANAEROBIC Blood Culture adequate volume Performed at [facility], [address] [place], [place] [address]    Culture  Setup Time   Final    GRAM POSITIVE COCCI IN BOTH AEROBIC AND ANAEROBIC  BOTTLES Gram Stain Report Called to,Read Back By and Verified With: Madelyn Brunner, Darrin Nipper Performed at [facility], [address] [place], [place] [address]    Culture (A)  Final    STAPHYLOCOCCUS AUREUS SUSCEPTIBILITIES PERFORMED ON PREVIOUS CULTURE WITHIN THE LAST 5 DAYS. Performed at [facility], [address], [place], [place] [address]  Report Status 09/08/2023 FINAL  Final  Blood Culture ID Panel (Reflexed)     Status: Abnormal   Collection Time: 09/05/23 10:57 AM  Result Value Ref Range Status   Enterococcus faecalis NOT DETECTED NOT DETECTED Final   Enterococcus Faecium NOT DETECTED NOT DETECTED Final   Listeria monocytogenes NOT DETECTED NOT DETECTED Final   Staphylococcus species DETECTED (A) NOT DETECTED Final    Comment: CRITICAL RESULT CALLED TO, READ BACK BY AND VERIFIED WITH: B SUPER,RN@0513  09/06/23 MK    Staphylococcus aureus (BCID) DETECTED (A) NOT DETECTED Final    Comment: Methicillin (oxacillin)-resistant Staphylococcus aureus (MRSA). MRSA is predictably resistant to beta-lactam antibiotics (except ceftaroline). Preferred therapy is vancomycin unless clinically contraindicated. Patient requires contact precautions if  hospitalized. CRITICAL RESULT CALLED TO, READ BACK BY AND VERIFIED WITH: B SUPER,RN@0513  09/06/23 MK    Staphylococcus epidermidis NOT DETECTED NOT DETECTED Final   Staphylococcus lugdunensis NOT DETECTED NOT DETECTED Final   Streptococcus species NOT DETECTED NOT DETECTED Final   Streptococcus agalactiae NOT DETECTED NOT DETECTED Final   Streptococcus pneumoniae NOT DETECTED NOT DETECTED Final   Streptococcus pyogenes NOT DETECTED NOT DETECTED Final   A.calcoaceticus-baumannii NOT DETECTED NOT DETECTED Final   Bacteroides fragilis NOT DETECTED NOT DETECTED Final   Enterobacterales NOT DETECTED NOT DETECTED Final   Enterobacter cloacae complex NOT DETECTED NOT DETECTED Final   Escherichia coli NOT DETECTED NOT DETECTED  Final   Klebsiella aerogenes NOT DETECTED NOT DETECTED Final   Klebsiella oxytoca NOT DETECTED NOT DETECTED Final   Klebsiella pneumoniae NOT DETECTED NOT DETECTED Final   Proteus species NOT DETECTED NOT DETECTED Final   Salmonella species NOT DETECTED NOT DETECTED Final   Serratia marcescens NOT DETECTED NOT DETECTED Final   Haemophilus influenzae NOT DETECTED NOT DETECTED Final   Neisseria meningitidis NOT DETECTED NOT DETECTED Final   Pseudomonas aeruginosa NOT DETECTED NOT DETECTED Final   Stenotrophomonas maltophilia NOT DETECTED NOT DETECTED Final   Candida albicans NOT DETECTED NOT DETECTED Final   Candida auris NOT DETECTED NOT DETECTED Final   Candida glabrata NOT DETECTED NOT DETECTED Final   Candida krusei NOT DETECTED NOT DETECTED Final   Candida parapsilosis NOT DETECTED NOT DETECTED Final   Candida tropicalis NOT DETECTED NOT DETECTED Final   Cryptococcus neoformans/gattii NOT DETECTED NOT DETECTED Final   Meth resistant mecA/C and MREJ DETECTED (A) NOT DETECTED Final    Comment: CRITICAL RESULT CALLED TO, READ BACK BY AND VERIFIED WITH: B SUPER,RN@0513  09/06/23 MK Performed at [facility], [address] [place], [place] [address]   Aerobic Culture w Gram Stain (superficial specimen)     Status: None   Collection Time: 09/05/23  1:54 PM   Specimen: Thigh  Result Value Ref Range Status   Specimen Description   Final    THIGH Performed at [facility], [address] [place], [place] [address]    Special Requests   Final    NONE Performed at [facility], [address], [place], [place] [address]    Gram Stain   Final    NO WBC SEEN RARE GRAM POSITIVE COCCI IN PAIRS Performed at [facility], [address] [place], [place] [address]    Culture   Final    ABUNDANT METHICILLIN RESISTANT STAPHYLOCOCCUS AUREUS   Report Status 09/07/2023 FINAL  Final   Organism ID, Bacteria METHICILLIN RESISTANT STAPHYLOCOCCUS AUREUS  Final      Susceptibility    Methicillin resistant staphylococcus aureus - MIC*  CIPROFLOXACIN 4 RESISTANT Resistant     ERYTHROMYCIN >=8 RESISTANT Resistant     GENTAMICIN <=0.5 SENSITIVE Sensitive     OXACILLIN >=4 RESISTANT Resistant     TETRACYCLINE <=1 SENSITIVE Sensitive     VANCOMYCIN 1 SENSITIVE Sensitive     TRIMETH/SULFA <=10 SENSITIVE Sensitive     CLINDAMYCIN <=0.25 SENSITIVE Sensitive     RIFAMPIN <=0.5 SENSITIVE Sensitive     Inducible Clindamycin NEGATIVE Sensitive     LINEZOLID 2 SENSITIVE Sensitive     * ABUNDANT METHICILLIN RESISTANT STAPHYLOCOCCUS AUREUS  MRSA Next Gen by PCR, Nasal     Status: Abnormal   Collection Time: 09/05/23  3:00 PM   Specimen: Nasal Mucosa; Nasal Swab  Result Value Ref Range Status   MRSA by PCR Next Gen DETECTED (A) NOT DETECTED Final    Comment: RESULT CALLED TO, READ BACK BY AND VERIFIED WITHTomma Rakers RN [id number] K FORSYTH (NOTE) The GeneXpert MRSA Assay (FDA approved for NASAL specimens only), is one component of a comprehensive MRSA colonization surveillance program. It is not intended to diagnose MRSA infection nor to guide or monitor treatment for MRSA infections. Test performance is not FDA approved in patients less than 57 years old. Performed at [facility], [address], [place], [place] [address]   Culture, blood (Routine X 2) w Reflex to ID Panel     Status: None (Preliminary result)   Collection Time: 09/06/23 12:29 PM   Specimen: BLOOD  Result Value Ref Range Status   Specimen Description BLOOD BLOOD LEFT HAND  Final   Special Requests   Final    BOTTLES DRAWN AEROBIC ONLY Blood Culture adequate volume   Culture   Final    NO GROWTH 4 DAYS Performed at [facility], [address] [place], [place] [address]    Report Status PENDING  Incomplete  Culture, blood (Routine X 2) w Reflex to ID Panel     Status: None (Preliminary result)   Collection Time: 09/06/23 12:29 PM   Specimen: BLOOD  Result Value Ref Range Status   Specimen Description  BLOOD BLOOD LEFT ARM ac  Final   Special Requests   Final    BOTTLES DRAWN AEROBIC AND ANAEROBIC Blood Culture adequate volume   Culture   Final    NO GROWTH 4 DAYS Performed at [facility], [address], [place], [place] [address]    Report Status PENDING  Incomplete  Cath Tip Culture     Status: Abnormal   Collection Time: 09/07/23 11:49 AM   Specimen: CENTRAL LINE; Other  Result Value Ref Range Status   Specimen Description   Final    CATH TIP CENTRAL LINE Performed at [facility], [address], [place], [place] [address]    Special Requests   Final    NONE Performed at [facility], [address] [place], [place] [address]    Culture (A)  Final    30,000 COLONIES/mL METHICILLIN RESISTANT STAPHYLOCOCCUS AUREUS   Report Status 09/10/2023 FINAL  Final   Organism ID, Bacteria METHICILLIN RESISTANT STAPHYLOCOCCUS AUREUS (A)  Final      Susceptibility   Methicillin resistant staphylococcus aureus - MIC*    CIPROFLOXACIN >=8 RESISTANT Resistant     ERYTHROMYCIN >=8 RESISTANT Resistant     GENTAMICIN <=0.5 SENSITIVE Sensitive     OXACILLIN >=4 RESISTANT Resistant     TETRACYCLINE <=1 SENSITIVE Sensitive     VANCOMYCIN 1 SENSITIVE Sensitive     TRIMETH/SULFA <=10 SENSITIVE Sensitive  CLINDAMYCIN <=0.25 SENSITIVE Sensitive     RIFAMPIN <=0.5 SENSITIVE Sensitive     Inducible Clindamycin NEGATIVE Sensitive     LINEZOLID 2 SENSITIVE Sensitive     * 30,000 COLONIES/mL METHICILLIN RESISTANT STAPHYLOCOCCUS AUREUS  Culture, blood (Routine X 2) w Reflex to ID Panel     Status: None (Preliminary result)   Collection Time: 09/07/23  6:55 PM   Specimen: BLOOD  Result Value Ref Range Status   Specimen Description BLOOD RIGHT ANTECUBITAL  Final   Special Requests   Final    BOTTLES DRAWN AEROBIC AND ANAEROBIC Blood Culture adequate volume   Culture   Final    NO GROWTH 3 DAYS Performed at [facility], [address] [place], [place] [address]    Report Status PENDING   Incomplete  Culture, blood (Routine X 2) w Reflex to ID Panel     Status: None (Preliminary result)   Collection Time: 09/07/23  6:55 PM   Specimen: BLOOD  Result Value Ref Range Status   Specimen Description BLOOD LEFT ANTECUBITAL  Final   Special Requests   Final    BOTTLES DRAWN AEROBIC AND ANAEROBIC Blood Culture adequate volume   Culture   Final    NO GROWTH 3 DAYS Performed at [facility], [address], [place], [place] [address]    Report Status PENDING  Incomplete     Discharge Instructions:   Discharge Instructions     Call MD for:  difficulty breathing, headache or visual disturbances   Complete by: As directed    Call MD for:  persistant dizziness or light-headedness   Complete by: As directed    Call MD for:  persistant nausea and vomiting   Complete by: As directed    Call MD for:  temperature >100.4   Complete by: As directed    Diet - low sodium heart healthy   Complete by: As directed    Diet Carb Modified   Complete by: As directed    Discharge instructions   Complete by: As directed    1)OPAT Orders (patient to get abx with HD center and no opat arrangement needed)  Discharge antibiotics: Vancomycin 1 gm to be given thrice weekly after dialysis days---TTS schedule for 4 weeks from 09/07/23 (Last dose 10/05/23)  Per pharmacy protocol/HD center Aim for Vancomycin trough 15-20 or AUC 400-550 (unless otherwise indicated)  Duration: 4 weeks from 09/07/23 End Date: 10/05/23 HD catheter Care Per Protocol: Labs weekly while on IV antibiotics: _c_ CBC with differential _x_ CMP Fax weekly labs to [phone]  Clinic Follow Up Appt: 10/11/23 @ 230  @ [facility], [address], [place], [place] [address], Phone: [phone]  2)Avoid ibuprofen/Advil/Aleve/Motrin/Goody Powders/Naproxen/BC powders/Meloxicam/Diclofenac/Indomethacin and other Nonsteroidal anti-inflammatory medications as these will make you more likely to bleed and can cause stomach ulcers,  can also cause Kidney problems.   3)Very Low-salt diet advised---Less than 2 gm of Sodium per day advised----ok to use Mrs DASH salt substitute instead of Salt  4)Weigh yourself daily, call if you gain more than 3 pounds in 1 day or more than 5 pounds in 1 week as your hemodialysis schedule and dry weight may need to be adjusted  5)Limit your Fluid  intake to no more than 50 ounces (1.5 Liters) per day   Increase activity slowly   Complete by: As directed    No wound care   Complete by: As directed       Allergies as of 09/11/2023  Reactions   Ciprofloxacin Swelling, Other (See Comments)   Per pt caused lips swell and nauseous feeling   Levaquin [levofloxacin] Swelling, Other (See Comments)   Per pt caused lips swell and nauseous feeling   Linaclotide Other (See Comments)   Cause severe dehydration   Promethazine Other (See Comments)   Completely wipes out/fatigue   Buspar [buspirone] Other (See Comments)   abd cramping   Advair Diskus [fluticasone-salmeterol] Other (See Comments)   Thrush    Biaxin [clarithromycin] Rash   Hydroxyzine Palpitations        Medication List     STOP taking these medications    VITAMIN C PO       TAKE these medications    acetaminophen 500 MG tablet Commonly known as: TYLENOL Take 500-1,000 mg by mouth every 6 (six) hours as needed (pain.).   aspirin EC 81 MG tablet Take 1 tablet (81 mg total) by mouth daily with breakfast. Swallow whole. What changed: when to take this   azelastine 0.1 % nasal spray Commonly known as: ASTELIN Place 2 sprays into both nostrils 2 (two) times daily. Use in each nostril as directed What changed: when to take this   buPROPion 300 MG 24 hr tablet Commonly known as: WELLBUTRIN XL Take 1 tablet (300 mg total) by mouth daily. What changed: when to take this   carvedilol 12.5 MG tablet Commonly known as: COREG Take 12.5 mg by mouth 2 (two) times daily with a meal.   clopidogrel 75 MG  tablet Commonly known as: PLAVIX Take 1 tablet (75 mg total) by mouth daily.   Continuous Blood Gluc Sensor Misc 1 each by Does not apply route as directed. Use as directed every 14 days. May dispense FreeStyle [employer] or similar.   diclofenac Sodium 1 % Gel Commonly known as: VOLTAREN Apply 2 g topically 4 (four) times daily as needed (pain.).   dicyclomine 10 MG capsule Commonly known as: BENTYL Take 10 mg by mouth 2 (two) times daily as needed for spasms.   DULoxetine 30 MG capsule Commonly known as: CYMBALTA Take 1 capsule (30 mg total) by mouth daily.   folic acid 800 MCG tablet Commonly known as: FOLVITE Take 800 mcg by mouth in the morning.   GlucaGen HypoKit 1 MG Solr injection Generic drug: glucagon Inject 1 mg into the skin once as needed for up to 1 dose for low blood sugar. GlucaGen HypoKit 1 mg Injection   hydrALAZINE 25 MG tablet Commonly known as: APRESOLINE Take 25 mg by mouth 2 (two) times daily.   lamoTRIgine 200 MG tablet Commonly known as: LAMICTAL Take 1 tablet (200 mg total) by mouth daily. What changed: when to take this   Levemir 100 UNIT/ML injection Generic drug: insulin detemir Inject 27 Units into the skin 2 (two) times daily as needed (if insulin pump inoperable).   levothyroxine 175 MCG tablet Commonly known as: SYNTHROID Take 1.5 tablets (mcg) by mouth on Sundays in the mornings & take 1 tablet (175 mcg) by mouth on all other days.   LORazepam 0.5 MG tablet Commonly known as: ATIVAN Take 1 tablet (0.5 mg total) by mouth daily as needed for anxiety. What changed:  how much to take reasons to take this   Melatonin 10 MG Tabs Take 10 mg by mouth at bedtime.   NovoLOG 100 UNIT/ML injection Generic drug: insulin aspart Inject into the skin continuous. Sliding Scale Insulin pump 2.5 units basal Bolus with meal depending on the size  ondansetron 4 MG disintegrating tablet Commonly known as: ZOFRAN-ODT Take 4 mg by  mouth every 8 (eight) hours as needed for nausea or vomiting.   OVER THE COUNTER MEDICATION Apply 1 application  topically at bedtime as needed (Sleep). CBD oil   OXYGEN Inhale 3 L into the lungs at bedtime.   rosuvastatin 10 MG tablet Commonly known as: CRESTOR Take 1 tablet (10 mg total) by mouth daily. What changed: when to take this   sevelamer carbonate 800 MG tablet Commonly known as: RENVELA Take 1,600 mg by mouth 3 (three) times daily with meals.   torsemide 20 MG tablet Commonly known as: DEMADEX TAKE TWO TABLETS BY MOUTH AS DIRECTED ON non dialysis DAYS   VITAMIN D-3 PO Take 1 tablet by mouth in the morning.        Follow-up Information     Raymondo Band, MD Follow up on 10/11/2023.   Specialty: Infectious Diseases Why: 2:30 pm Contact information: [address] [place] [address] [phone]                  Time coordinating discharge: 39 minutes  Signed:  Jake Goodson  [facility] 09/11/2023, 9:58 AM

## 2023-09-10 NOTE — Progress Notes (Signed)
 PROGRESS NOTE  Jasmine [name] ZOX:[id number] DOB: 1971-02-01 DOA: 09/05/2023 PCP: Richmond Campbell., PA-C   LOS: 5 days   Brief narrative:  Jasmine [name] is a 53 y.o. female with a history of T1DM on insulin pump, CAD s/p DES x4 on 08/30/2023, ESRD (on HD TTS thru right IJ TDC), OSA on 3L nocturnal O2, obesity, HTN, HLD, bipolar disorder, IBS and recent I&D of right thigh abscess (3/28) presented to the hospital brought in by EMS from home with feeling ill, weak, fatigued starting the night before associated with fever of 101.56F and shaking chills.  In the ED patient was febrile with leukocytosis and tachypnea was noted.  Was placed on supplemental oxygen as below.  Lactate was normal.  Troponin was 273 without EKG changes.  Chest x-ray showed right hilar opacity.  Antibiotics with started blood cultures were drawn and patient was admitted hospital. Blood cultures have grown MRSA at this time.    Assessment/Plan: Principal Problem:   Sepsis due to methicillin resistant Staphylococcus aureus (MRSA) (HCC) Active Problems:   Essential hypertension   Acquired hypothyroidism   Bipolar II disorder (HCC)   Chronic migraine w/o aura w/o status migrainosus, not intractable   GERD (gastroesophageal reflux disease)   Other hyperlipidemia   Elevated troponin   Asthma   CAD (coronary artery disease)   End-stage renal disease on hemodialysis (HCC)   DKA, type 1 (HCC)   Myocardial injury   Abscess of right thigh   MRSA bacteremia   Bacteremia   MRSA bacteremia and sepsis -in the setting of right thigh abscess -received Rocephin and doxycycline initially.  TTE on 4-25 with no endocarditis and subsequently had TEE on 09/08/2023 without any vegetation.  On vancomycin with hemodialysis started 09/06/2023.  Right internal jugular tunneled hemodialysis catheter removed on 09/07/23 to allow for line holiday.  Repeat blood cultures from 09/17/2023 negative.  Plan is to continue vancomycin 1 g Tuesday  Thursday Saturday for 4 weeks starting negative cultures from 4-25.  Seen by ID.   CAD s/p DES x4 08/30/2023 with troponin elevation, HTN, HLD:  - Continue DAPT, Crestor, Coreg and hydralazine.  Cardiology was consulted.  Plan to follow-up with cardiology as outpatient on 09/14/2023.   Acute on chronic hypoxic respiratory failure (nocturnal O2 chronically):  At baseline.  ESRD: TTS schedule.  Seen by nephrology.  Hold torsemide on dialysis days.  Plan is for IR to place tunneled hemodialysis catheter on 09/10/2023 if cultures still negative.-Patient can be discharged after hemodialysis    MRSA Right thigh abscess:   - Repeat I&D in ED 09/05/23, continue basic local wound care and abx    DM1--last A1c 5.6 reflecting excellent diabetic control PTA - Insulin pump ran out of insulin, current and Semglee at reduced dose.  Plan is to continue insulin pump at discharge.  Bipolar disorder:  - Continue home medications including lamictal, wellbutrin, cymbalta, prn ativan   Hypothyroidism:  - Continue synthroid at dosing, typically takes 1.5x dose on Sundays.    DVT prophylaxis:    Disposition: Home after hemodialysis likely on 4/ 6/5  Status is: Inpatient Remains inpatient appropriate because: Need for hemodialysis    Code Status:     Code Status: Full Code  Family Communication: None at bedside  Consultants: Nephrology  Procedures: Hemodialysis Right internal jugular tunneled hemodialysis catheter removed on 09/07/23   Anti-infectives:  Vancomycin IV  Anti-infectives (From admission, onward)    Start     Dose/Rate Route Frequency Ordered Stop  09/12/23 0630  ceFAZolin (ANCEF) IVPB 2g/100 mL premix  Status:  Discontinued        2 g 200 mL/hr over 30 Minutes Intravenous 30 min pre-op 09/09/23 1141 09/10/23 0906   09/10/23 1200  vancomycin (VANCOCIN) IVPB 1000 mg/200 mL premix        1,000 mg 200 mL/hr over 60 Minutes Intravenous Every T-Th-Sa (Hemodialysis) 09/08/23 1023      09/10/23 1130  ceFAZolin (ANCEF) IVPB 2g/100 mL premix        2 g 200 mL/hr over 30 Minutes Intravenous  Once 09/10/23 1035 09/10/23 1123   09/06/23 1200  vancomycin (VANCOCIN) IVPB 1000 mg/200 mL premix  Status:  Discontinued        1,000 mg 200 mL/hr over 60 Minutes Intravenous Every T-Th-Sa (Hemodialysis) 09/06/23 0142 09/08/23 1023   09/06/23 1000  cefTRIAXone (ROCEPHIN) 1 g in sodium chloride 0.9 % 100 mL IVPB  Status:  Discontinued        1 g 200 mL/hr over 30 Minutes Intravenous Every 24 hours 09/05/23 1447 09/06/23 0831   09/06/23 0230  vancomycin (VANCOREADY) IVPB 2000 mg/400 mL        2,000 mg 200 mL/hr over 120 Minutes Intravenous  Once 09/06/23 0139 09/06/23 0432   09/06/23 0139  vancomycin variable dose per unstable renal function (pharmacist dosing)  Status:  Discontinued         Does not apply See admin instructions 09/06/23 0139 09/06/23 0142   09/05/23 2200  doxycycline (VIBRA-TABS) tablet 100 mg  Status:  Discontinued        100 mg Oral Every 12 hours 09/05/23 1447 09/06/23 0831   09/05/23 1200  doxycycline (VIBRAMYCIN) 100 mg in sodium chloride 0.9 % 250 mL IVPB        100 mg 125 mL/hr over 120 Minutes Intravenous  Once 09/05/23 1132 09/05/23 1358   09/05/23 1145  cefTRIAXone (ROCEPHIN) 1 g in sodium chloride 0.9 % 100 mL IVPB        1 g 200 mL/hr over 30 Minutes Intravenous  Once 09/05/23 1132 09/05/23 1338        Subjective: Today, patient was seen and examined at bedside.  Patient denies any nausea, vomiting or shortness of breath, fever or chills.  Here for hemodialysis catheter placement and hemodialysis.  Objective: Vitals:   09/10/23 1110 09/10/23 1115  BP: (!) 208/91 (!) 144/126  Pulse: 94 94  Resp: (!) 27 (!) 23  Temp:    SpO2: 98% 93%    Intake/Output Summary (Last 24 hours) at 09/10/2023 1453 Last data filed at 09/10/2023 0907 Gross per 24 hour  Intake 3 ml  Output 0 ml  Net 3 ml   Filed Weights   09/06/23 1806 09/08/23 1256 09/09/23 1800   Weight: 118.7 kg 118.7 kg 116.3 kg   Body mass index is 37.86 kg/m.   Physical Exam: GENERAL: Patient is alert awake and oriented. Not in obvious distress.  Obese HENT: No scleral pallor or icterus. Pupils equally reactive to light. Oral mucosa is moist, left internal jugular hemodialysis catheter in place. NECK: is supple, no gross swelling noted. CHEST: Clear to auscultation. No crackles or wheezes.   CVS: S1 and S2 heard, no murmur. Regular rate and rhythm.  ABDOMEN: Soft, non-tender, bowel sounds are present. EXTREMITIES: No edema.  Right thigh wound with dressing. CNS: Cranial nerves are intact.  Generalized weakness noted. SKIN: warm and dry without rashes.  Right thigh wound with dressing  Data Review: I have  personally reviewed the following laboratory data and studies,  CBC: Recent Labs  Lab 09/05/23 1057 09/06/23 0512 09/07/23 0431 09/09/23 0430 09/10/23 1148  WBC 14.4* 17.9* 10.6* 7.4 9.5  NEUTROABS 13.1*  --   --   --   --   HGB 10.8* 9.4* 9.7* 10.1* 13.0  HCT 34.0* 30.6* 30.7* 32.2* 40.9  MCV 95.2 97.1 95.9 94.7 92.7  PLT 203 165 198 247 295   Basic Metabolic Panel: Recent Labs  Lab 09/05/23 1057 09/06/23 0512 09/07/23 0431 09/09/23 0430 09/10/23 1148  NA 133* 131* 133* 135 135  K 3.8 4.1 3.7 3.4* 4.6  CL 97* 96* 94* 99 98  CO2 23 25 28 27  18*  GLUCOSE 155* 114* 106* 119* 354*  BUN 34* 48* 28* 37* 36*  CREATININE 5.55* 6.35* 3.98* 5.16* 5.01*  CALCIUM 9.3 9.3 8.9 9.0 9.9  PHOS  --   --   --   --  3.2   Liver Function Tests: Recent Labs  Lab 09/05/23 1057 09/09/23 0430 09/10/23 1148  AST 73* 12*  --   ALT 38 18  --   ALKPHOS 118 88  --   BILITOT 0.6 0.4  --   PROT 6.4* 6.2*  --   ALBUMIN 2.9* 2.4* 2.8*   No results for input(s): "LIPASE", "AMYLASE" in the last 168 hours. No results for input(s): "AMMONIA" in the last 168 hours. Cardiac Enzymes: No results for input(s): "CKTOTAL", "CKMB", "CKMBINDEX", "TROPONINI" in the last 168  hours. BNP (last 3 results) No results for input(s): "BNP" in the last 8760 hours.  ProBNP (last 3 results) No results for input(s): "PROBNP" in the last 8760 hours.  CBG: Recent Labs  Lab 09/09/23 2010 09/10/23 0019 09/10/23 0412 09/10/23 0754 09/10/23 1132  GLUCAP 273* 241* 273* 336* 356*   Recent Results (from the past 240 hours)  Resp panel by RT-PCR (RSV, Flu A&B, Covid) Anterior Nasal Swab     Status: None   Collection Time: 09/05/23 10:32 AM   Specimen: Anterior Nasal Swab  Result Value Ref Range Status   SARS Coronavirus 2 by RT PCR NEGATIVE NEGATIVE Final    Comment: (NOTE) SARS-CoV-2 target nucleic acids are NOT DETECTED.  The SARS-CoV-2 RNA is generally detectable in upper respiratory specimens during the acute phase of infection. The lowest concentration of SARS-CoV-2 viral copies this assay can detect is 138 copies/mL. A negative result does not preclude SARS-Cov-2 infection and should not be used as the sole basis for treatment or other patient management decisions. A negative result may occur with  improper specimen collection/handling, submission of specimen other than nasopharyngeal swab, presence of viral mutation(s) within the areas targeted by this assay, and inadequate number of viral copies(<138 copies/mL). A negative result must be combined with clinical observations, patient history, and epidemiological information. The expected result is Negative.  Fact Sheet for Patients:  BloggerCourse.com  Fact Sheet for Healthcare Providers:  SeriousBroker.it  This test is no t yet approved or cleared by the [place] FDA and  has been authorized for detection and/or diagnosis of SARS-CoV-2 by FDA under an Emergency Use Authorization (EUA). This EUA will remain  in effect (meaning this test can be used) for the duration of the COVID-19 declaration under Section 564(b)(1) of the Act, 21 U.S.C.section  360bbb-3(b)(1), unless the authorization is terminated  or revoked sooner.       Influenza A by PCR NEGATIVE NEGATIVE Final   Influenza B by PCR NEGATIVE NEGATIVE Final  Comment: (NOTE) The Xpert Xpress SARS-CoV-2/FLU/RSV plus assay is intended as an aid in the diagnosis of influenza from Nasopharyngeal swab specimens and should not be used as a sole basis for treatment. Nasal washings and aspirates are unacceptable for Xpert Xpress SARS-CoV-2/FLU/RSV testing.  Fact Sheet for Patients: BloggerCourse.com  Fact Sheet for Healthcare Providers: SeriousBroker.it  This test is not yet approved or cleared by the [place] FDA and has been authorized for detection and/or diagnosis of SARS-CoV-2 by FDA under an Emergency Use Authorization (EUA). This EUA will remain in effect (meaning this test can be used) for the duration of the COVID-19 declaration under Section 564(b)(1) of the Act, 21 U.S.C. section 360bbb-3(b)(1), unless the authorization is terminated or revoked.     Resp Syncytial Virus by PCR NEGATIVE NEGATIVE Final    Comment: (NOTE) Fact Sheet for Patients: BloggerCourse.com  Fact Sheet for Healthcare Providers: SeriousBroker.it  This test is not yet approved or cleared by the [place] FDA and has been authorized for detection and/or diagnosis of SARS-CoV-2 by FDA under an Emergency Use Authorization (EUA). This EUA will remain in effect (meaning this test can be used) for the duration of the COVID-19 declaration under Section 564(b)(1) of the Act, 21 U.S.C. section 360bbb-3(b)(1), unless the authorization is terminated or revoked.  Performed at [facility], [address] [place], [place] [address]   Blood Culture (routine x 2)     Status: Abnormal   Collection Time: 09/05/23 10:57 AM   Specimen: BLOOD  Result Value Ref Range Status   Specimen Description    Final    BLOOD BLOOD LEFT ARM LAC Performed at [facility], [address], [place], [place] [address]    Special Requests   Final    BOTTLES DRAWN AEROBIC AND ANAEROBIC Blood Culture adequate volume Performed at [facility], [address] [place], [place] [address]    Culture  Setup Time   Final    GRAM POSITIVE COCCI IN BOTH AEROBIC AND ANAEROBIC BOTTLES Gram Stain Report Called to,Read Back By and Verified With: Mitzie Na [phone], VIRAY,J CRITICAL RESULT CALLED TO, READ BACK BY AND VERIFIED WITH: B SUPER,RN@0513  09/06/23 MK Performed at [facility], [address] [place], [place] [address]    Culture METHICILLIN RESISTANT STAPHYLOCOCCUS AUREUS (A)  Final   Report Status 09/08/2023 FINAL  Final   Organism ID, Bacteria METHICILLIN RESISTANT STAPHYLOCOCCUS AUREUS  Final      Susceptibility   Methicillin resistant staphylococcus aureus - MIC*    CIPROFLOXACIN >=8 RESISTANT Resistant     ERYTHROMYCIN >=8 RESISTANT Resistant     GENTAMICIN <=0.5 SENSITIVE Sensitive     OXACILLIN >=4 RESISTANT Resistant     TETRACYCLINE <=1 SENSITIVE Sensitive     VANCOMYCIN 1 SENSITIVE Sensitive     TRIMETH/SULFA <=10 SENSITIVE Sensitive     CLINDAMYCIN <=0.25 SENSITIVE Sensitive     RIFAMPIN <=0.5 SENSITIVE Sensitive     Inducible Clindamycin NEGATIVE Sensitive     LINEZOLID 2 SENSITIVE Sensitive     * METHICILLIN RESISTANT STAPHYLOCOCCUS AUREUS  Blood Culture (routine x 2)     Status: Abnormal   Collection Time: 09/05/23 10:57 AM   Specimen: BLOOD  Result Value Ref Range Status   Specimen Description   Final    BLOOD BLOOD LEFT HAND Performed at [facility], [address], [place], [place] [address]    Special Requests   Final    BOTTLES DRAWN AEROBIC AND ANAEROBIC Blood Culture adequate volume  Performed at [facility], [address], [place], [place] [address]    Culture  Setup Time   Final    GRAM POSITIVE COCCI IN BOTH AEROBIC AND ANAEROBIC BOTTLES Gram Stain  Report Called to,Read Back By and Verified With: Mitzie Na [phone], Darrin Nipper Performed at [facility], [address], [place], [place] [address]    Culture (A)  Final    STAPHYLOCOCCUS AUREUS SUSCEPTIBILITIES PERFORMED ON PREVIOUS CULTURE WITHIN THE LAST 5 DAYS. Performed at [facility], [address] [place], [place] [address]    Report Status 09/08/2023 FINAL  Final  Blood Culture ID Panel (Reflexed)     Status: Abnormal   Collection Time: 09/05/23 10:57 AM  Result Value Ref Range Status   Enterococcus faecalis NOT DETECTED NOT DETECTED Final   Enterococcus Faecium NOT DETECTED NOT DETECTED Final   Listeria monocytogenes NOT DETECTED NOT DETECTED Final   Staphylococcus species DETECTED (A) NOT DETECTED Final    Comment: CRITICAL RESULT CALLED TO, READ BACK BY AND VERIFIED WITH: B SUPER,RN@0513  09/06/23 MK    Staphylococcus aureus (BCID) DETECTED (A) NOT DETECTED Final    Comment: Methicillin (oxacillin)-resistant Staphylococcus aureus (MRSA). MRSA is predictably resistant to beta-lactam antibiotics (except ceftaroline). Preferred therapy is vancomycin unless clinically contraindicated. Patient requires contact precautions if  hospitalized. CRITICAL RESULT CALLED TO, READ BACK BY AND VERIFIED WITH: B SUPER,RN@0513  09/06/23 MK    Staphylococcus epidermidis NOT DETECTED NOT DETECTED Final   Staphylococcus lugdunensis NOT DETECTED NOT DETECTED Final   Streptococcus species NOT DETECTED NOT DETECTED Final   Streptococcus agalactiae NOT DETECTED NOT DETECTED Final   Streptococcus pneumoniae NOT DETECTED NOT DETECTED Final   Streptococcus pyogenes NOT DETECTED NOT DETECTED Final   A.calcoaceticus-baumannii NOT DETECTED NOT DETECTED Final   Bacteroides fragilis NOT DETECTED NOT DETECTED Final   Enterobacterales NOT DETECTED NOT DETECTED Final   Enterobacter cloacae complex NOT DETECTED NOT DETECTED Final   Escherichia coli NOT DETECTED NOT DETECTED Final   Klebsiella  aerogenes NOT DETECTED NOT DETECTED Final   Klebsiella oxytoca NOT DETECTED NOT DETECTED Final   Klebsiella pneumoniae NOT DETECTED NOT DETECTED Final   Proteus species NOT DETECTED NOT DETECTED Final   Salmonella species NOT DETECTED NOT DETECTED Final   Serratia marcescens NOT DETECTED NOT DETECTED Final   Haemophilus influenzae NOT DETECTED NOT DETECTED Final   Neisseria meningitidis NOT DETECTED NOT DETECTED Final   Pseudomonas aeruginosa NOT DETECTED NOT DETECTED Final   Stenotrophomonas maltophilia NOT DETECTED NOT DETECTED Final   Candida albicans NOT DETECTED NOT DETECTED Final   Candida auris NOT DETECTED NOT DETECTED Final   Candida glabrata NOT DETECTED NOT DETECTED Final   Candida krusei NOT DETECTED NOT DETECTED Final   Candida parapsilosis NOT DETECTED NOT DETECTED Final   Candida tropicalis NOT DETECTED NOT DETECTED Final   Cryptococcus neoformans/gattii NOT DETECTED NOT DETECTED Final   Meth resistant mecA/C and MREJ DETECTED (A) NOT DETECTED Final    Comment: CRITICAL RESULT CALLED TO, READ BACK BY AND VERIFIED WITH: B SUPER,RN@0513  09/06/23 MK Performed at [facility], [address] [place], [place] [address]   Aerobic Culture w Gram Stain (superficial specimen)     Status: None   Collection Time: 09/05/23  1:54 PM   Specimen: Thigh  Result Value Ref Range Status   Specimen Description   Final    THIGH Performed at [facility], [address], [place], [place] [address]    Special Requests   Final  NONE Performed at [facility], [address], [place], [place] [address]    Gram Stain   Final    NO WBC SEEN RARE GRAM POSITIVE COCCI IN PAIRS Performed at [facility], [address], [place], [place] [address]    Culture   Final    ABUNDANT METHICILLIN RESISTANT STAPHYLOCOCCUS AUREUS   Report Status 09/07/2023 FINAL  Final   Organism ID, Bacteria METHICILLIN RESISTANT STAPHYLOCOCCUS AUREUS  Final      Susceptibility   Methicillin resistant  staphylococcus aureus - MIC*    CIPROFLOXACIN 4 RESISTANT Resistant     ERYTHROMYCIN >=8 RESISTANT Resistant     GENTAMICIN <=0.5 SENSITIVE Sensitive     OXACILLIN >=4 RESISTANT Resistant     TETRACYCLINE <=1 SENSITIVE Sensitive     VANCOMYCIN 1 SENSITIVE Sensitive     TRIMETH/SULFA <=10 SENSITIVE Sensitive     CLINDAMYCIN <=0.25 SENSITIVE Sensitive     RIFAMPIN <=0.5 SENSITIVE Sensitive     Inducible Clindamycin NEGATIVE Sensitive     LINEZOLID 2 SENSITIVE Sensitive     * ABUNDANT METHICILLIN RESISTANT STAPHYLOCOCCUS AUREUS  MRSA Next Gen by PCR, Nasal     Status: Abnormal   Collection Time: 09/05/23  3:00 PM   Specimen: Nasal Mucosa; Nasal Swab  Result Value Ref Range Status   MRSA by PCR Next Gen DETECTED (A) NOT DETECTED Final    Comment: RESULT CALLED TO, READ BACK BY AND VERIFIED WITHTomma Rakers RN [phone] K FORSYTH (NOTE) The GeneXpert MRSA Assay (FDA approved for NASAL specimens only), is one component of a comprehensive MRSA colonization surveillance program. It is not intended to diagnose MRSA infection nor to guide or monitor treatment for MRSA infections. Test performance is not FDA approved in patients less than 25 years old. Performed at [facility], [address], [place], [place] [address]   Culture, blood (Routine X 2) w Reflex to ID Panel     Status: None (Preliminary result)   Collection Time: 09/06/23 12:29 PM   Specimen: BLOOD  Result Value Ref Range Status   Specimen Description BLOOD BLOOD LEFT HAND  Final   Special Requests   Final    BOTTLES DRAWN AEROBIC ONLY Blood Culture adequate volume   Culture   Final    NO GROWTH 4 DAYS Performed at [facility], [address] [place], [place] [address]    Report Status PENDING  Incomplete  Culture, blood (Routine X 2) w Reflex to ID Panel     Status: None (Preliminary result)   Collection Time: 09/06/23 12:29 PM   Specimen: BLOOD  Result Value Ref Range Status   Specimen Description BLOOD BLOOD LEFT ARM  ac  Final   Special Requests   Final    BOTTLES DRAWN AEROBIC AND ANAEROBIC Blood Culture adequate volume   Culture   Final    NO GROWTH 4 DAYS Performed at [facility], [address], [place], [place] [address]    Report Status PENDING  Incomplete  Cath Tip Culture     Status: Abnormal   Collection Time: 09/07/23 11:49 AM   Specimen: CENTRAL LINE; Other  Result Value Ref Range Status   Specimen Description   Final    CATH TIP CENTRAL LINE Performed at [facility], [address] [place], [place] [address]    Special Requests   Final    NONE Performed at [facility], [address] [place] [address] [place], [place] [address]    Culture (A)  Final  30,000 COLONIES/mL METHICILLIN RESISTANT STAPHYLOCOCCUS AUREUS   Report Status 09/10/2023 FINAL  Final   Organism ID, Bacteria METHICILLIN RESISTANT STAPHYLOCOCCUS AUREUS (A)  Final      Susceptibility   Methicillin resistant staphylococcus aureus - MIC*    CIPROFLOXACIN >=8 RESISTANT Resistant     ERYTHROMYCIN >=8 RESISTANT Resistant     GENTAMICIN <=0.5 SENSITIVE Sensitive     OXACILLIN >=4 RESISTANT Resistant     TETRACYCLINE <=1 SENSITIVE Sensitive     VANCOMYCIN 1 SENSITIVE Sensitive     TRIMETH/SULFA <=10 SENSITIVE Sensitive     CLINDAMYCIN <=0.25 SENSITIVE Sensitive     RIFAMPIN <=0.5 SENSITIVE Sensitive     Inducible Clindamycin NEGATIVE Sensitive     LINEZOLID 2 SENSITIVE Sensitive     * 30,000 COLONIES/mL METHICILLIN RESISTANT STAPHYLOCOCCUS AUREUS  Culture, blood (Routine X 2) w Reflex to ID Panel     Status: None (Preliminary result)   Collection Time: 09/07/23  6:55 PM   Specimen: BLOOD  Result Value Ref Range Status   Specimen Description BLOOD RIGHT ANTECUBITAL  Final   Special Requests   Final    BOTTLES DRAWN AEROBIC AND ANAEROBIC Blood Culture adequate volume   Culture   Final    NO GROWTH 3 DAYS Performed at [facility], [address] [place], [place] [address]    Report Status PENDING  Incomplete  Culture, blood  (Routine X 2) w Reflex to ID Panel     Status: None (Preliminary result)   Collection Time: 09/07/23  6:55 PM   Specimen: BLOOD  Result Value Ref Range Status   Specimen Description BLOOD LEFT ANTECUBITAL  Final   Special Requests   Final    BOTTLES DRAWN AEROBIC AND ANAEROBIC Blood Culture adequate volume   Culture   Final    NO GROWTH 3 DAYS Performed at [facility], [address] [place], [place] [address]    Report Status PENDING  Incomplete     Studies: IR Fluoro Guide CV Line Left Result Date: 09/10/2023 INDICATION: 53 year old female referred for tunneled hemodialysis catheter after line holiday for infection EXAM: TUNNELED CENTRAL VENOUS HEMODIALYSIS CATHETER PLACEMENT WITH ULTRASOUND AND FLUOROSCOPIC GUIDANCE MEDICATIONS: 2 g Ancef. The antibiotic was given in an appropriate time interval prior to skin puncture. ANESTHESIA/SEDATION: Moderate (conscious) sedation was employed during this procedure. A total of Versed 1.0 mg and Fentanyl 50 mcg was administered intravenously by the radiology nurse. Total intra-service moderate Sedation Time: 20 minutes. The patient's level of consciousness and vital signs were monitored continuously by radiology nursing throughout the procedure under my direct supervision. FLUOROSCOPY: Radiation Exposure Index (as provided by the fluoroscopic device): 3.0 mGy Kerma COMPLICATIONS: None immediate. PROCEDURE: Informed written consent was obtained from the patient after a discussion of the risks, benefits, and alternatives to treatment. Questions regarding the procedure were encouraged and answered. The left neck and chest were prepped with chlorhexidine in a sterile fashion, and a sterile drape was applied covering the operative field. Maximum barrier sterile technique with sterile gowns and gloves were used for the procedure. A timeout was performed prior to the initiation of the procedure. Ultrasound survey was performed. This demonstrated partial occlusion of  the right internal jugular vein. The left was selected for access. The left internal jugular vein was confirmed to be patent, with images stored and sent to PACS. Micropuncture kit was utilized to access the left internal jugular vein under direct, real-time ultrasound guidance after the overlying soft tissues were anesthetized with 1% lidocaine with  epinephrine. Stab incision was made with 11 blade scalpel. Microwire was passed centrally. The microwire was then marked to measure appropriate internal catheter length. External tunneled length was estimated. A total tip to cuff length of 19 cm was selected. 035 guidewire was advanced to the level of the IVC. Skin and subcutaneous tissues of chest wall below the clavicle were generously infiltrated with 1% lidocaine for local anesthesia. A small stab incision was made with 11 blade scalpel. The selected hemodialysis catheter was tunneled in a retrograde fashion from the anterior chest wall to the venotomy incision. Serial dilation was performed and then a peel-away sheath was placed. The catheter was then placed through the peel-away sheath with tips ultimately positioned within the superior aspect of the right atrium. Final catheter positioning was confirmed and documented with a spot radiographic image. The catheter aspirates and flushes normally. The catheter was flushed with appropriate volume heparin dwells. The catheter exit site was secured with a 0-Prolene retention suture. Gel-Foam slurry was infused into the soft tissue tract. The venotomy incision was closed Derma bond and sterile dressing. Dressings were applied at the chest wall. Patient tolerated the procedure well and remained hemodynamically stable throughout. No complications were encountered and no significant blood loss encountered. IMPRESSION: Status post image guided left-sided internal jugular tunneled hemodialysis catheter. Signed, Yvone Neu. Miachel Roux, RPVI [facility] Inter[facility]  [facility] Radiology Electronically Signed   By: Gilmer Mor D.O.   On: 09/10/2023 11:39   IR US Guide Vasc Access Left Result Date: 09/10/2023 INDICATION: 53 year old female referred for tunneled hemodialysis catheter after line holiday for infection EXAM: TUNNELED CENTRAL VENOUS HEMODIALYSIS CATHETER PLACEMENT WITH ULTRASOUND AND FLUOROSCOPIC GUIDANCE MEDICATIONS: 2 g Ancef. The antibiotic was given in an appropriate time interval prior to skin puncture. ANESTHESIA/SEDATION: Moderate (conscious) sedation was employed during this procedure. A total of Versed 1.0 mg and Fentanyl 50 mcg was administered intravenously by the radiology nurse. Total intra-service moderate Sedation Time: 20 minutes. The patient's level of consciousness and vital signs were monitored continuously by radiology nursing throughout the procedure under my direct supervision. FLUOROSCOPY: Radiation Exposure Index (as provided by the fluoroscopic device): 3.0 mGy Kerma COMPLICATIONS: None immediate. PROCEDURE: Informed written consent was obtained from the patient after a discussion of the risks, benefits, and alternatives to treatment. Questions regarding the procedure were encouraged and answered. The left neck and chest were prepped with chlorhexidine in a sterile fashion, and a sterile drape was applied covering the operative field. Maximum barrier sterile technique with sterile gowns and gloves were used for the procedure. A timeout was performed prior to the initiation of the procedure. Ultrasound survey was performed. This demonstrated partial occlusion of the right internal jugular vein. The left was selected for access. The left internal jugular vein was confirmed to be patent, with images stored and sent to PACS. Micropuncture kit was utilized to access the left internal jugular vein under direct, real-time ultrasound guidance after the overlying soft tissues were anesthetized with 1% lidocaine with  epinephrine. Stab incision was made with 11 blade scalpel. Microwire was passed centrally. The microwire was then marked to measure appropriate internal catheter length. External tunneled length was estimated. A total tip to cuff length of 19 cm was selected. 035 guidewire was advanced to the level of the IVC. Skin and subcutaneous tissues of chest wall below the clavicle were generously infiltrated with 1% lidocaine for local anesthesia. A small stab incision was made with 11 blade scalpel. The selected hemodialysis  catheter was tunneled in a retrograde fashion from the anterior chest wall to the venotomy incision. Serial dilation was performed and then a peel-away sheath was placed. The catheter was then placed through the peel-away sheath with tips ultimately positioned within the superior aspect of the right atrium. Final catheter positioning was confirmed and documented with a spot radiographic image. The catheter aspirates and flushes normally. The catheter was flushed with appropriate volume heparin dwells. The catheter exit site was secured with a 0-Prolene retention suture. Gel-Foam slurry was infused into the soft tissue tract. The venotomy incision was closed Derma bond and sterile dressing. Dressings were applied at the chest wall. Patient tolerated the procedure well and remained hemodynamically stable throughout. No complications were encountered and no significant blood loss encountered. IMPRESSION: Status post image guided left-sided internal jugular tunneled hemodialysis catheter. Signed, Yvone Neu. Miachel Roux, RPVI [facility] Inter[facility] Radiology Specialists [facility] Radiology Electronically Signed   By: Gilmer Mor D.O.   On: 09/10/2023 11:39   ECHO TEE Result Date: 09/08/2023    TRANSESOPHOGEAL ECHO REPORT   Patient Name:   Jasmine [name] Date of Exam: 09/08/2023 Medical Rec #:  [id number]         Height:       69.0 in Accession #:    [id number]        Weight:       261.7 lb Date  of Birth:  11/20/1970         BSA:          2.316 m Patient Age:    52 years          BP:           182/74 mmHg Patient Gender: F                 HR:           72 bpm. [facility] Procedure: Transesophageal Echo, Cardiac Doppler and Color Doppler (Both            Spectral and Color Flow Doppler were utilized during procedure). Indications:    Bacteremia R78.81  History:        Patient has prior history of Echocardiogram examinations, most                 recent 09/07/2023. CAD; Risk Factors:Hypertension, Diabetes,                 Dyslipidemia and Former Smoker. ESRD on dailysis.  Sonographer:    Celesta Gentile RCS Referring Phys: [id number] Basilio Cairo PROCEDURE: The transesophogeal probe was passed without difficulty through the esophogus of the patient. Sedation performed by different physician. Image quality was excellent. The patient developed no complications during the procedure.  IMPRESSIONS  1. Left ventricular ejection fraction, by estimation, is 60 to 65%. The left ventricle has normal function.  2. Right ventricular systolic function is normal. The right ventricular size is normal.  3. Left atrial size was mildly dilated. No left atrial/left atrial appendage thrombus was detected. The LAA emptying velocity was 60 cm/s.  4. The mitral valve is normal in structure. Trivial mitral valve regurgitation. No evidence of mitral stenosis.  5. The aortic valve is tricuspid. Aortic valve regurgitation is not visualized. No aortic stenosis is present. Conclusion(s)/Recommendation(s): No evidence of vegetation/infective endocarditis on this transesophageael echocardiogram. FINDINGS  Left Ventricle: Left ventricular ejection fraction, by estimation, is 60 to 65%. The left ventricle has normal function. The left  ventricular internal cavity size was normal in size. Right Ventricle: The right ventricular size is normal. No increase in right ventricular wall thickness. Right ventricular systolic function is  normal. Left Atrium: Left atrial size was mildly dilated. No left atrial/left atrial appendage thrombus was detected. The LAA emptying velocity was 60 cm/s. Right Atrium: Right atrial size was normal in size. Pericardium: There is no evidence of pericardial effusion. Mitral Valve: The mitral valve is normal in structure. Trivial mitral valve regurgitation. No evidence of mitral valve stenosis. Tricuspid Valve: The tricuspid valve is normal in structure. Tricuspid valve regurgitation is mild . No evidence of tricuspid stenosis. Aortic Valve: The aortic valve is tricuspid. Aortic valve regurgitation is not visualized. No aortic stenosis is present. Pulmonic Valve: The pulmonic valve was normal in structure. Pulmonic valve regurgitation is not visualized. No evidence of pulmonic stenosis. Aorta: The aortic root is normal in size and structure. IAS/Shunts: No atrial level shunt detected by color flow Doppler.   AORTA Ao Root diam: 2.90 cm Dina Rich MD Electronically signed by Dina Rich MD Signature Date/Time: 09/08/2023/3:25:16 PM    Final       Joycelyn Das, MD  [facility] 09/10/2023  If 7PM-7AM, please contact night-coverage

## 2023-09-10 NOTE — Progress Notes (Signed)
 Received patient in bed to unit.  Alert and orientedx4 Informed consent signed and in chart.   TX duration:3.5 hours  Patient tolerated well.  Transported back to the room  Alert, without acute distress.  Hand-off given to patient's nurse.   Access used: dialysis cath Access issues: none  Total UF removed: 3L Medication(s) given: heplock 1.6 per port Post HD VS: see table below Post HD weight: 114.6kg   09/10/23 2129  Vitals  Temp 98.3 F (36.8 C)  Temp Source Oral  BP 113/67  MAP (mmHg) 78  BP Location Left Wrist  BP Method Automatic  Patient Position (if appropriate) Lying  Pulse Rate 94  Pulse Rate Source Monitor  ECG Heart Rate 95  Resp (!) 24  Oxygen Therapy  SpO2 93 %  O2 Device Room Air  Patient Activity (if Appropriate) In bed  Pulse Oximetry Type Continuous  During Treatment Monitoring  Dialysate Potassium Concentration 2  Dialysate Calcium Concentration 2.5  HD Safety Checks Performed Yes  Intra-Hemodialysis Comments Tolerated well  Post Treatment  Dialyzer Clearance Lightly streaked  Hemodialysis Intake (mL) 0 mL  Liters Processed 83.9  Fluid Removed (mL) 3000 mL  Tolerated HD Treatment Yes  Post-Hemodialysis Comments goal met  Hemodialysis Catheter Left Internal jugular Double lumen Permanent (Tunneled)  Placement Date/Time: 09/10/23 1054   Placed prior to admission: No  Serial / Lot #: [id number]  Expiration Date: 05/16/28  Time Out: Correct patient;Correct site;Correct procedure  Maximum sterile barrier precautions: Large sterile sheet;Hand hygiene;...  Site Condition No complications  Blue Lumen Status Flushed;Heparin locked;Dead end cap in place  Red Lumen Status Flushed;Heparin locked;Dead end cap in place  Purple Lumen Status N/A  Catheter fill solution Heparin 1000 units/ml  Catheter fill volume (Arterial) 1.6 cc  Catheter fill volume (Venous) 1.6  Post treatment catheter status Capped and Clamped      [facility]

## 2023-09-10 NOTE — Hospital Course (Signed)
 Jasmine [name] is a 53 y.o. female with a history of T1DM on insulin pump, CAD s/p DES x4 on 08/30/2023, ESRD (on HD TTS thru right IJ TDC), OSA on 3L nocturnal O2, obesity, HTN, HLD, bipolar disorder, IBS and recent I&D of right thigh abscess (3/28) presented to the hospital brought in by EMS from home with feeling ill, weak, fatigued starting the night before associated with fever of 101.18F and shaking chills.  In the ED patient was febrile with leukocytosis and tachypnea was noted.  Was placed on supplemental oxygen as below.  Lactate was normal.  Troponin was 273 without EKG changes.  Chest x-ray showed right hilar opacity.  Antibiotics with started blood cultures were drawn and patient was admitted hospital. Blood cultures have grown MRSA at this time.  MRSA bacteremia and sepsis -in the setting of right thigh abscess -received Rocephin and doxycycline initially.  TTE on 4-25 with no endocarditis and subsequently had TEE on 09/08/2023 without any vegetation.  On vancomycin with hemodialysis started 09/06/2023.  Right internal jugular tunneled hemodialysis catheter removed on 09/07/23 to allow for line holiday.  Repeat blood cultures from 09/17/2023 negative.  Plan is to continue vancomycin 1 g Tuesday Thursday Saturday for 4 weeks starting negative cultures from 4-25.  Seen by ID.   CAD s/p DES x4 08/30/2023 with troponin elevation, HTN, HLD:  - Continue DAPT, Crestor, Coreg and hydralazine.  Cardiology was consulted.  Plan to follow-up with cardiology as outpatient on 09/14/2023.   Acute on chronic hypoxic respiratory failure (nocturnal O2 chronically):  At baseline.  ESRD: TTS schedule.  Seen by nephrology.  Hold torsemide on dialysis days.  Plan is for IR to place tunneled hemodialysis catheter on 09/10/2023 if cultures still negative.-Patient can be discharged home from [facility] after hemodialysis on 09/10/2023   MRSA Right thigh abscess:   - Repeat I&D in ED 09/05/23, continue basic local  wound care and abx    DM1--last A1c 5.6 reflecting excellent diabetic control PTA - Insulin pump ran out of insulin, current and Semglee at reduced dose.  Plan is to continue insulin pump at discharge.  Bipolar disorder:  - Continue home medications including lamictal, wellbutrin, cymbalta, prn ativan   Hypothyroidism:  - Continue synthroid at dosing, typically takes 1.5x dose on Sundays.

## 2023-09-11 DIAGNOSIS — R6520 Severe sepsis without septic shock: Secondary | ICD-10-CM | POA: Diagnosis not present

## 2023-09-11 DIAGNOSIS — J9601 Acute respiratory failure with hypoxia: Secondary | ICD-10-CM | POA: Diagnosis not present

## 2023-09-11 DIAGNOSIS — A4102 Sepsis due to Methicillin resistant Staphylococcus aureus: Secondary | ICD-10-CM | POA: Diagnosis not present

## 2023-09-11 LAB — GLUCOSE, CAPILLARY
Glucose-Capillary: 384 mg/dL — ABNORMAL HIGH (ref 70–99)
Glucose-Capillary: 234 mg/dL — ABNORMAL HIGH (ref 70–99)
Glucose-Capillary: 309 mg/dL — ABNORMAL HIGH (ref 70–99)

## 2023-09-11 LAB — CULTURE, BLOOD (ROUTINE X 2)
Culture: NO GROWTH
Culture: NO GROWTH
Special Requests: ADEQUATE
Special Requests: ADEQUATE

## 2023-09-11 MED ORDER — VANCOMYCIN HCL IN DEXTROSE 1-5 GM/200ML-% IV SOLN
1000.0000 mg | INTRAVENOUS | Status: AC
  Administered 2023-09-11: 1000 mg via INTRAVENOUS
  Filled 2023-09-11: qty 200

## 2023-09-11 NOTE — Progress Notes (Signed)
 Subjective:  seen in rm no co, tolerated HD yest on schedule 3 l uf  and below edw now  / feel  ok for dc home   Objective Vital signs in last 24 hours: Vitals:   09/10/23 2130 09/10/23 2215 09/11/23 0508 09/11/23 0806  BP:  128/67 (!) 156/75 136/72  Pulse:  88 90 87  Resp:  16 14 18   Temp:  97.6 F (36.4 C) 97.8 F (36.6 C) (!) 97.5 F (36.4 C)  TempSrc:  Oral Oral Oral  SpO2:  96% 95% 96%  Weight: 114.6 kg     Height:       Weight change: 0.998 kg  Physical Exam: General: alert female  ,nad Heart: RRR, no MRG Lungs: CTA  nonlabored Abdomen: NABS , soft  ND Extremities:  no pedal edema Dialysis Access: L internal jugular TDc      OP HD: [facility] TTS  4h   117.5kg    3K bath  RDC  B400  Heparin 2500 Mircera 75 mcg q 4 wks Venofer 50mg  weekly     Problem/Plan: Sepsis - 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  On IV Vancomycin. ID recommends line holiday. TDC removed on 4/2, then replaced 4/05 per IR. The blood culture has been negative so far.  Pt is okay for dc after HD from a renal standpoint. She will get 1 gm IV vanc w/ HD thru April 30 (4 wks from 4/02).  I will notify op center  ESRD -last HD on 4/1, on HD sch. Yest 4/05  sat / Resume TTS at OP unit post discharge.  Hypertension/volume  - stable and will UF to edw as bp tolerates Anemia esrd  -  hgb at goal.  Continue erythropoietin.  Transfuse for Hgb <7, hold off on IV iron given bacteremia Metabolic bone disease -  continue with home meds  Acute on chronic hypoxic respiratory failure - UF w/ HD below edw   will notify center 114KG     Lenny Pastel, PA-C [facility] [phone] 09/11/2023,9:21 AM  LOS: 6 days   Labs: Basic Metabolic Panel: Recent Labs  Lab 09/07/23 0431 09/09/23 0430 09/10/23 1148  NA 133* 135 135  K 3.7 3.4* 4.6  CL 94* 99 98  CO2 28 27 18*  GLUCOSE 106* 119* 354*  BUN 28* 37* 36*  CREATININE 3.98* 5.16* 5.01*  CALCIUM 8.9 9.0  9.9  PHOS  --   --  3.2   Liver Function Tests: Recent Labs  Lab 09/05/23 1057 09/09/23 0430 09/10/23 1148  AST 73* 12*  --   ALT 38 18  --   ALKPHOS 118 88  --   BILITOT 0.6 0.4  --   PROT 6.4* 6.2*  --   ALBUMIN 2.9* 2.4* 2.8*   No results for input(s): "LIPASE", "AMYLASE" in the last 168 hours. No results for input(s): "AMMONIA" in the last 168 hours. CBC: Recent Labs  Lab 09/05/23 1057 09/06/23 0512 09/07/23 0431 09/09/23 0430 09/10/23 1148  WBC 14.4* 17.9* 10.6* 7.4 9.5  NEUTROABS 13.1*  --   --   --   --   HGB 10.8* 9.4* 9.7* 10.1* 13.0  HCT 34.0* 30.6* 30.7* 32.2* 40.9  MCV 95.2 97.1 95.9 94.7 92.7  PLT 203 165 198 247 295   Cardiac Enzymes: No results for input(s): "CKTOTAL", "CKMB", "CKMBINDEX", "TROPONINI" in the last 168 hours. CBG: Recent Labs  Lab 09/10/23 0754 09/10/23  1132 09/10/23 2210 09/11/23 0513 09/11/23 0807  GLUCAP 336* 356* 234* 384* 234*    Studies/Results: DG CHEST PORT 1 VIEW Result Date: 09/10/2023 CLINICAL DATA:  Central line placement. EXAM: PORTABLE CHEST 1 VIEW COMPARISON:  09/05/2018 FINDINGS: Left-sided dialysis catheter tip overlies the lower SVC. No pneumothorax. Low lung volumes with scattered atelectasis and bronchovascular crowding. Improving pulmonary edema. No pleural effusion. IMPRESSION: 1. Left-sided dialysis catheter tip overlies the lower SVC. No pneumothorax. 2. Low lung volumes with scattered atelectasis. Improving pulmonary edema. Electronically Signed   By: Narda Rutherford M.D.   On: 09/10/2023 15:58   IR Fluoro Guide CV Line Left Result Date: 09/10/2023 INDICATION: 53 year old female referred for tunneled hemodialysis catheter after line holiday for infection EXAM: TUNNELED CENTRAL VENOUS HEMODIALYSIS CATHETER PLACEMENT WITH ULTRASOUND AND FLUOROSCOPIC GUIDANCE MEDICATIONS: 2 g Ancef. The antibiotic was given in an appropriate time interval prior to skin puncture. ANESTHESIA/SEDATION: Moderate (conscious) sedation was  employed during this procedure. A total of Versed 1.0 mg and Fentanyl 50 mcg was administered intravenously by the radiology nurse. Total intra-service moderate Sedation Time: 20 minutes. The patient's level of consciousness and vital signs were monitored continuously by radiology nursing throughout the procedure under my direct supervision. FLUOROSCOPY: Radiation Exposure Index (as provided by the fluoroscopic device): 3.0 mGy Kerma COMPLICATIONS: None immediate. PROCEDURE: Informed written consent was obtained from the patient after a discussion of the risks, benefits, and alternatives to treatment. Questions regarding the procedure were encouraged and answered. The left neck and chest were prepped with chlorhexidine in a sterile fashion, and a sterile drape was applied covering the operative field. Maximum barrier sterile technique with sterile gowns and gloves were used for the procedure. A timeout was performed prior to the initiation of the procedure. Ultrasound survey was performed. This demonstrated partial occlusion of the right internal jugular vein. The left was selected for access. The left internal jugular vein was confirmed to be patent, with images stored and sent to PACS. Micropuncture kit was utilized to access the left internal jugular vein under direct, real-time ultrasound guidance after the overlying soft tissues were anesthetized with 1% lidocaine with epinephrine. Stab incision was made with 11 blade scalpel. Microwire was passed centrally. The microwire was then marked to measure appropriate internal catheter length. External tunneled length was estimated. A total tip to cuff length of 19 cm was selected. 035 guidewire was advanced to the level of the IVC. Skin and subcutaneous tissues of chest wall below the clavicle were generously infiltrated with 1% lidocaine for local anesthesia. A small stab incision was made with 11 blade scalpel. The selected hemodialysis catheter was tunneled in a  retrograde fashion from the anterior chest wall to the venotomy incision. Serial dilation was performed and then a peel-away sheath was placed. The catheter was then placed through the peel-away sheath with tips ultimately positioned within the superior aspect of the right atrium. Final catheter positioning was confirmed and documented with a spot radiographic image. The catheter aspirates and flushes normally. The catheter was flushed with appropriate volume heparin dwells. The catheter exit site was secured with a 0-Prolene retention suture. Gel-Foam slurry was infused into the soft tissue tract. The venotomy incision was closed Derma bond and sterile dressing. Dressings were applied at the chest wall. Patient tolerated the procedure well and remained hemodynamically stable throughout. No complications were encountered and no significant blood loss encountered. IMPRESSION: Status post image guided left-sided internal jugular tunneled hemodialysis catheter. Signed, Yvone Neu. Loreta Ave DO, ABVM, RPVI Vascular  and Interventional Radiology Specialists [facility] Radiology Electronically Signed   By: Gilmer Mor D.O.   On: 09/10/2023 11:39   IR US Guide Vasc Access Left Result Date: 09/10/2023 INDICATION: 53 year old female referred for tunneled hemodialysis catheter after line holiday for infection EXAM: TUNNELED CENTRAL VENOUS HEMODIALYSIS CATHETER PLACEMENT WITH ULTRASOUND AND FLUOROSCOPIC GUIDANCE MEDICATIONS: 2 g Ancef. The antibiotic was given in an appropriate time interval prior to skin puncture. ANESTHESIA/SEDATION: Moderate (conscious) sedation was employed during this procedure. A total of Versed 1.0 mg and Fentanyl 50 mcg was administered intravenously by the radiology nurse. Total intra-service moderate Sedation Time: 20 minutes. The patient's level of consciousness and vital signs were monitored continuously by radiology nursing throughout the procedure under my direct supervision. FLUOROSCOPY: Radiation  Exposure Index (as provided by the fluoroscopic device): 3.0 mGy Kerma COMPLICATIONS: None immediate. PROCEDURE: Informed written consent was obtained from the patient after a discussion of the risks, benefits, and alternatives to treatment. Questions regarding the procedure were encouraged and answered. The left neck and chest were prepped with chlorhexidine in a sterile fashion, and a sterile drape was applied covering the operative field. Maximum barrier sterile technique with sterile gowns and gloves were used for the procedure. A timeout was performed prior to the initiation of the procedure. Ultrasound survey was performed. This demonstrated partial occlusion of the right internal jugular vein. The left was selected for access. The left internal jugular vein was confirmed to be patent, with images stored and sent to PACS. Micropuncture kit was utilized to access the left internal jugular vein under direct, real-time ultrasound guidance after the overlying soft tissues were anesthetized with 1% lidocaine with epinephrine. Stab incision was made with 11 blade scalpel. Microwire was passed centrally. The microwire was then marked to measure appropriate internal catheter length. External tunneled length was estimated. A total tip to cuff length of 19 cm was selected. 035 guidewire was advanced to the level of the IVC. Skin and subcutaneous tissues of chest wall below the clavicle were generously infiltrated with 1% lidocaine for local anesthesia. A small stab incision was made with 11 blade scalpel. The selected hemodialysis catheter was tunneled in a retrograde fashion from the anterior chest wall to the venotomy incision. Serial dilation was performed and then a peel-away sheath was placed. The catheter was then placed through the peel-away sheath with tips ultimately positioned within the superior aspect of the right atrium. Final catheter positioning was confirmed and documented with a spot radiographic image.  The catheter aspirates and flushes normally. The catheter was flushed with appropriate volume heparin dwells. The catheter exit site was secured with a 0-Prolene retention suture. Gel-Foam slurry was infused into the soft tissue tract. The venotomy incision was closed Derma bond and sterile dressing. Dressings were applied at the chest wall. Patient tolerated the procedure well and remained hemodynamically stable throughout. No complications were encountered and no significant blood loss encountered. IMPRESSION: Status post image guided left-sided internal jugular tunneled hemodialysis catheter. Signed, Yvone Neu. Miachel Roux, RPVI Vascular and Interventional Radiology Specialists [facility] Radiology Electronically Signed   By: Gilmer Mor D.O.   On: 09/10/2023 11:39   Medications:  vancomycin     vancomycin      aspirin EC  81 mg Oral QHS   buPROPion  300 mg Oral QPM   Chlorhexidine Gluconate Cloth  6 each Topical Q0600   Chlorhexidine Gluconate Cloth  6 each Topical Q0600   Chlorhexidine Gluconate Cloth  6 each Topical Q0600  clopidogrel  75 mg Oral Daily   darbepoetin (ARANESP) injection - DIALYSIS  60 mcg Subcutaneous Q Fri-1800   folic acid  1 mg Oral q AM   insulin aspart  0-9 Units Subcutaneous Q4H   insulin aspart  2 Units Subcutaneous TID WC   insulin glargine-yfgn  10 Units Subcutaneous QHS   lamoTRIgine  200 mg Oral QPM   levothyroxine  175 mcg Oral Q0600   melatonin  9 mg Oral QHS   midazolam  2 mg Intravenous Once   rosuvastatin  10 mg Oral QPM   sevelamer carbonate  1,600 mg Oral TID WC   sodium chloride flush  3 mL Intravenous Q12H

## 2023-09-11 NOTE — Progress Notes (Signed)
 Pt was not aware she was being d/c after hemo,she did not have a ride to go home,The AC,charge nurse and and doctor Opyd aware.

## 2023-09-12 ENCOUNTER — Other Ambulatory Visit: Payer: Self-pay | Admitting: Psychiatry

## 2023-09-12 DIAGNOSIS — F3181 Bipolar II disorder: Secondary | ICD-10-CM

## 2023-09-12 LAB — CULTURE, BLOOD (ROUTINE X 2)
Culture: NO GROWTH
Culture: NO GROWTH
Special Requests: ADEQUATE
Special Requests: ADEQUATE
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
## 2023-09-12 NOTE — Telephone Encounter (Signed)
 Scheduled for virtual on 09-14-23, almost out of the bupropion. Told her it should be refilled on Wedn
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
## 2023-09-14 ENCOUNTER — Ambulatory Visit: Admitting: Physician Assistant

## 2023-09-14 ENCOUNTER — Encounter: Payer: Self-pay | Admitting: Psychiatry

## 2023-09-14 ENCOUNTER — Telehealth (INDEPENDENT_AMBULATORY_CARE_PROVIDER_SITE_OTHER): Admitting: Psychiatry

## 2023-09-14 DIAGNOSIS — F3181 Bipolar II disorder: Secondary | ICD-10-CM

## 2023-09-14 DIAGNOSIS — F411 Generalized anxiety disorder: Secondary | ICD-10-CM

## 2023-09-14 DIAGNOSIS — E108 Type 1 diabetes mellitus with unspecified complications: Secondary | ICD-10-CM

## 2023-09-14 DIAGNOSIS — I2510 Atherosclerotic heart disease of native coronary artery without angina pectoris: Secondary | ICD-10-CM

## 2023-09-14 DIAGNOSIS — F603 Borderline personality disorder: Secondary | ICD-10-CM | POA: Diagnosis not present

## 2023-09-14 DIAGNOSIS — E1169 Type 2 diabetes mellitus with other specified complication: Secondary | ICD-10-CM

## 2023-09-14 DIAGNOSIS — I10 Essential (primary) hypertension: Secondary | ICD-10-CM

## 2023-09-14 DIAGNOSIS — G4700 Insomnia, unspecified: Secondary | ICD-10-CM | POA: Diagnosis not present

## 2023-09-14 DIAGNOSIS — Z955 Presence of coronary angioplasty implant and graft: Secondary | ICD-10-CM

## 2023-09-14 DIAGNOSIS — Z992 Dependence on renal dialysis: Secondary | ICD-10-CM

## 2023-09-14 MED ORDER — BUPROPION HCL ER (XL) 300 MG PO TB24: 300.0000 mg | ORAL_TABLET | Freq: Every day | ORAL | 5 refills | Status: DC

## 2023-09-14 MED ORDER — LORAZEPAM 0.5 MG PO TABS: 0.5000 mg | ORAL_TABLET | Freq: Every day | ORAL | 0 refills | Status: DC | PRN

## 2023-09-14 MED ORDER — LAMOTRIGINE 200 MG PO TABS: 200.0000 mg | ORAL_TABLET | Freq: Every evening | ORAL | 5 refills | Status: DC

## 2023-09-14 NOTE — Patient Instructions (Signed)
 Continue lamotrigine 200 mg daily Continue duloxetine 30 mg daily  Continue bupropion 300 mg daily  Continue lorazepam 0.5 mg daily as needed for anxiety   Next appointment: 5/23 at 11 30

## 2023-09-20 ENCOUNTER — Telehealth: Payer: Self-pay | Admitting: Psychiatry

## 2023-09-20 NOTE — Telephone Encounter (Signed)
 Called the number and left a voice message. Noted that [phone] connected to a different individual and not Dr. Gerrianne Krauss. Advised Jasmine [name] to contact us  back. Also requested that they send a Release of Information if it has not yet been completed, so we can discuss our mutual patient.

## 2023-09-26 ENCOUNTER — Encounter (HOSPITAL_COMMUNITY): Payer: Self-pay

## 2023-09-26 ENCOUNTER — Other Ambulatory Visit: Payer: Self-pay

## 2023-09-26 ENCOUNTER — Emergency Department (HOSPITAL_COMMUNITY)
Admission: EM | Admit: 2023-09-26 | Discharge: 2023-09-27 | Disposition: A | Discharge status: Home / Self Care | Attending: Emergency Medicine | Admitting: Emergency Medicine

## 2023-09-26 DIAGNOSIS — Z7982 Long term (current) use of aspirin: Secondary | ICD-10-CM | POA: Diagnosis not present

## 2023-09-26 DIAGNOSIS — Z794 Long term (current) use of insulin: Secondary | ICD-10-CM | POA: Insufficient documentation

## 2023-09-26 DIAGNOSIS — Z7902 Long term (current) use of antithrombotics/antiplatelets: Secondary | ICD-10-CM | POA: Insufficient documentation

## 2023-09-26 DIAGNOSIS — M79601 Pain in right arm: Secondary | ICD-10-CM | POA: Diagnosis present

## 2023-09-26 NOTE — ED Triage Notes (Signed)
 Pt bib EMS from home with c/o right arm pain and swelling that started today, denies injury

## 2023-09-27 ENCOUNTER — Encounter (HOSPITAL_COMMUNITY): Payer: Self-pay

## 2023-09-27 ENCOUNTER — Telehealth: Payer: Self-pay | Admitting: Internal Medicine

## 2023-09-27 DIAGNOSIS — M79601 Pain in right arm: Secondary | ICD-10-CM | POA: Diagnosis not present

## 2023-09-27 MED ORDER — ENOXAPARIN SODIUM 120 MG/0.8ML IJ SOSY
1.0000 mg/kg | PREFILLED_SYRINGE | Freq: Once | INTRAMUSCULAR | Status: AC
  Administered 2023-09-27: 114 mg via SUBCUTANEOUS
  Filled 2023-09-27: qty 0.8

## 2023-09-27 NOTE — Telephone Encounter (Signed)
 Patient seen in ED for swelling/numbness/pain in right arm. She was discharged home with plan to have ultrasound to r/o blood clot. This test is scheduled at [facility] next week on 4/29.  Patient continues to have swelling and pain to right arm, she is asking if there is anything else he would recommend or if possible to schedule ultrasound sooner.  Will forward to Dr. Arthuro Billow nurse to follow-up with patient.

## 2023-09-27 NOTE — Telephone Encounter (Signed)
 Pt called in stating she was in ED last night with right arm numbness and pain. She states they gave her some med and she has an ultrasound next Tuesday. She asked if Dr. Lorie Rook has any other suggestions for her. Please advise.

## 2023-09-27 NOTE — ED Provider Notes (Signed)
 [place] EMERGENCY DEPARTMENT AT [facility] Provider Note   CSN: [id number] Arrival date & time: 09/26/23  2247     History  Chief Complaint  Patient presents with   Arm Pain    Jasmine [name] is a 53 y.o. female.  Spontaneous rigth arm pain and swelling starting this morning. Slightly improved from then but still more swollen than normal. No neuro changes. Has a dialysis catheter in left chest, never had any in right. On plavix , no antiocagulation.    Arm Pain       Home Medications Prior to Admission medications   Medication Sig Start Date End Date Taking? Authorizing Provider  acetaminophen  (TYLENOL ) 500 MG tablet Take 500-1,000 mg by mouth every 6 (six) hours as needed (pain.).    [provider]  aspirin  EC 81 MG tablet Take 1 tablet (81 mg total) by mouth daily with breakfast. Swallow whole. 09/09/23   Colin Dawley, MD  azelastine  (ASTELIN ) 0.1 % nasal spray Place 2 sprays into both nostrils 2 (two) times daily. Use in each nostril as directed Patient taking differently: Place 2 sprays into both nostrils in the morning. Use in each nostril as directed 08/01/23   Antonio Baumgarten, NP  buPROPion  (WELLBUTRIN  XL) 300 MG 24 hr tablet Take 1 tablet (300 mg total) by mouth daily. 09/19/23 03/17/24  Todd Fossa, MD  carvedilol  (COREG ) 12.5 MG tablet Take 12.5 mg by mouth 2 (two) times daily with a meal. 01/17/23 01/17/24  [provider]  Cholecalciferol  (VITAMIN D -3 PO) Take 1 tablet by mouth in the morning.    [provider]  clopidogrel  (PLAVIX ) 75 MG tablet Take 1 tablet (75 mg total) by mouth daily. 02/22/23   Clearnce Curia, NP  Continuous Blood Gluc Sensor MISC 1 each by Does not apply route as directed. Use as directed every 14 days. May dispense FreeStyle [employer] or similar.    [provider]  diclofenac  Sodium (VOLTAREN ) 1 % GEL Apply 2 g topically 4 (four) times daily as needed (pain.).    [provider]  dicyclomine  (BENTYL ) 10 MG capsule Take 10 mg by mouth 2 (two) times daily as needed for spasms. 12/25/22   [provider]  DULoxetine  (CYMBALTA ) 30 MG capsule Take 1 capsule (30 mg total) by mouth daily. 07/03/23 10/31/23  Todd Fossa, MD  folic acid  (FOLVITE ) 800 MCG tablet Take 800 mcg by mouth in the morning.    [provider]  glucagon (GLUCAGEN  HYPOKIT) 1 MG SOLR injection Inject 1 mg into the skin once as needed for up to 1 dose for low blood sugar. GlucaGen  HypoKit 1 mg Injection 03/31/23   Johnson, Clanford L, MD  hydrALAZINE  (APRESOLINE ) 25 MG tablet Take 25 mg by mouth 2 (two) times daily. 05/03/23   [provider]  insulin  detemir (LEVEMIR ) 100 UNIT/ML injection Inject 27 Units into the skin 2 (two) times daily as needed (if insulin  pump inoperable). 04/04/23   [provider]  lamoTRIgine  (LAMICTAL ) 200 MG tablet Take 1 tablet (200 mg total) by mouth every evening. 10/21/23 04/18/24  Todd Fossa, MD  levothyroxine  (SYNTHROID ) 175 MCG tablet Take 1.5 tablets (mcg) by mouth on Sundays in the mornings & take 1 tablet (175 mcg) by mouth on all other days. 11/20/21   [provider]  LORazepam  (ATIVAN ) 0.5 MG tablet Take 1 tablet (0.5 mg total) by mouth daily as needed for anxiety. 10/12/23 11/11/23  Todd Fossa, MD  Melatonin  10 MG TABS Take 10 mg by mouth at bedtime.    [provider]  NOVOLOG  100 UNIT/ML injection Inject into the skin continuous. Sliding Scale Insulin  pump 2.5 units basal Bolus with meal depending on the size 03/15/22   [provider]  ondansetron  (ZOFRAN -ODT) 4 MG disintegrating tablet Take 4 mg by mouth every 8 (eight) hours as needed for nausea or vomiting. 03/17/22   [provider]  OVER THE COUNTER MEDICATION Apply 1 application  topically at bedtime as needed (Sleep). CBD oil    [provider]  OXYGEN Inhale 3 L into the lungs at bedtime.    [provider]   rosuvastatin  (CRESTOR ) 10 MG tablet Take 1 tablet (10 mg total) by mouth daily. Patient taking differently: Take 10 mg by mouth every evening. 07/29/23 10/27/23  Sheryle Donning, MD  sevelamer  carbonate (RENVELA ) 800 MG tablet Take 1,600 mg by mouth 3 (three) times daily with meals. 07/28/23   [provider]  torsemide  (DEMADEX ) 20 MG tablet TAKE TWO TABLETS BY MOUTH AS DIRECTED ON non dialysis DAYS    [provider]      Allergies    Ciprofloxacin , Levaquin [levofloxacin], Linaclotide, Promethazine , Buspar [buspirone], Advair diskus [fluticasone -salmeterol], Biaxin [clarithromycin], and Hydroxyzine    Review of Systems   Review of Systems  Physical Exam Updated Vital Signs BP (!) 160/76 (BP Location: Left Wrist)   Pulse 77   Temp 98.5 F (36.9 C) (Oral)   Resp 16   Ht 5\' 9"  (1.753 m)   Wt 114.6 kg   LMP 03/23/2017 (Approximate)   SpO2 94%   BMI 37.31 kg/m  Physical Exam Vitals and nursing note reviewed.  Constitutional:      Appearance: She is well-developed.  HENT:     Head: Normocephalic and atraumatic.  Cardiovascular:     Rate and Rhythm: Normal rate and regular rhythm.     Comments: No ttp on deep vein path in right arm.  Pulmonary:     Effort: No respiratory distress.     Breath sounds: No stridor.  Abdominal:     General: There is no distension.  Musculoskeletal:     Cervical back: Normal range of motion.     Comments: Right arm with diffuse swelling, strong pulse, neuro intact.  Skin:    General: Skin is warm and dry.     Findings: No erythema.  Neurological:     General: No focal deficit present.     Mental Status: She is alert.     ED Results / Procedures / Treatments   Labs (all labs ordered are listed, but only abnormal results are displayed) Labs Reviewed - No data to display  EKG None  Radiology No results found.  Procedures Procedures    Medications Ordered in ED Medications  enoxaparin  (LOVENOX ) injection  114 mg (114 mg Subcutaneous Given 09/27/23 0400)    ED Course/ Medical Decision Making/ A&P                                 Medical Decision Making Risk Prescription drug management.   No US  available at this time, will treat w/ sq lovenox  as there is no e/o cellulitis, trauma or other obvious causes. Will need to return for dvt study, nursing to help with scheduling.   Final Clinical Impression(s) / ED Diagnoses Final diagnoses:  Right arm pain    Rx / DC Orders ED  Discharge Orders          Ordered    US  Venous Img Upper Uni Right        09/27/23 0357              Neils Siracusa, Reymundo Caulk, MD 09/27/23 [phone]

## 2023-09-28 ENCOUNTER — Encounter (HOSPITAL_COMMUNITY): Admission: RE | Admit: 2023-09-28 | Source: Ambulatory Visit

## 2023-09-28 NOTE — Telephone Encounter (Signed)
 Left detailed message (DPR) of reply from Dr. Lorie Rook and adv to call back if needs anything further before the ultrasound.      Thukkani, Arun K, MD    Keeping it elevated may help.  The ultrasound will help guide us ; I suspect she has a hematoma and will slowly get better.

## 2023-09-29 ENCOUNTER — Telehealth (HOSPITAL_COMMUNITY): Payer: Self-pay

## 2023-09-29 NOTE — Telephone Encounter (Signed)
 Received a call from [facility] to get pt scheduled for a cvc exchange for 4/25. After waiting awhile I still hadn't received an order so I called [facility] to have them fax the order over. I spoke with Joy and she said to hold off bc the pt already has an appt with [facility] tomorrow at 1030. AB

## 2023-09-30 ENCOUNTER — Other Ambulatory Visit: Payer: Self-pay

## 2023-09-30 ENCOUNTER — Ambulatory Visit (HOSPITAL_COMMUNITY)
Admission: RE | Admit: 2023-09-30 | Discharge: 2023-09-30 | Disposition: A | Discharge status: Home / Self Care | Attending: Nephrology | Admitting: Nephrology

## 2023-09-30 ENCOUNTER — Encounter (HOSPITAL_COMMUNITY)
Admission: RE | Disposition: A | Discharge status: Home / Self Care | Payer: Self-pay | Source: Home / Self Care | Attending: Nephrology

## 2023-09-30 DIAGNOSIS — T859XXA Unspecified complication of internal prosthetic device, implant and graft, initial encounter: Secondary | ICD-10-CM | POA: Insufficient documentation

## 2023-09-30 DIAGNOSIS — N186 End stage renal disease: Secondary | ICD-10-CM | POA: Diagnosis not present

## 2023-09-30 DIAGNOSIS — G4733 Obstructive sleep apnea (adult) (pediatric): Secondary | ICD-10-CM | POA: Diagnosis not present

## 2023-09-30 DIAGNOSIS — Z87891 Personal history of nicotine dependence: Secondary | ICD-10-CM | POA: Diagnosis not present

## 2023-09-30 DIAGNOSIS — I120 Hypertensive chronic kidney disease with stage 5 chronic kidney disease or end stage renal disease: Secondary | ICD-10-CM | POA: Insufficient documentation

## 2023-09-30 DIAGNOSIS — D631 Anemia in chronic kidney disease: Secondary | ICD-10-CM | POA: Diagnosis not present

## 2023-09-30 DIAGNOSIS — E039 Hypothyroidism, unspecified: Secondary | ICD-10-CM | POA: Insufficient documentation

## 2023-09-30 DIAGNOSIS — Z794 Long term (current) use of insulin: Secondary | ICD-10-CM | POA: Diagnosis not present

## 2023-09-30 DIAGNOSIS — N250 Renal osteodystrophy: Secondary | ICD-10-CM | POA: Diagnosis not present

## 2023-09-30 DIAGNOSIS — Z8619 Personal history of other infectious and parasitic diseases: Secondary | ICD-10-CM | POA: Insufficient documentation

## 2023-09-30 DIAGNOSIS — Z992 Dependence on renal dialysis: Secondary | ICD-10-CM | POA: Insufficient documentation

## 2023-09-30 DIAGNOSIS — E1022 Type 1 diabetes mellitus with diabetic chronic kidney disease: Secondary | ICD-10-CM | POA: Insufficient documentation

## 2023-09-30 HISTORY — PX: TUNNELLED CATHETER EXCHANGE: CATH118373

## 2023-09-30 SURGERY — TUNNELLED CATHETER EXCHANGE

## 2023-09-30 MED ORDER — LIDOCAINE HCL (PF) 1 % IJ SOLN
INTRAMUSCULAR | Status: DC | PRN
  Administered 2023-09-30: 5 mL via SUBCUTANEOUS

## 2023-09-30 MED ORDER — FENTANYL CITRATE (PF) 100 MCG/2ML IJ SOLN
INTRAMUSCULAR | Status: AC
  Filled 2023-09-30: qty 2

## 2023-09-30 MED ORDER — HEPARIN SODIUM (PORCINE) 1000 UNIT/ML IJ SOLN
INTRAMUSCULAR | Status: AC
  Filled 2023-09-30: qty 10

## 2023-09-30 MED ORDER — MIDAZOLAM HCL 2 MG/2ML IJ SOLN
INTRAMUSCULAR | Status: AC
  Filled 2023-09-30: qty 2

## 2023-09-30 MED ORDER — MIDAZOLAM HCL 2 MG/2ML IJ SOLN
INTRAMUSCULAR | Status: DC | PRN
  Administered 2023-09-30: 1 mg via INTRAVENOUS

## 2023-09-30 MED ORDER — IODIXANOL 320 MG/ML IV SOLN
INTRAVENOUS | Status: DC | PRN
Start: 2023-09-30 — End: 2023-09-30
  Administered 2023-09-30: 2 mL via INTRAVENOUS

## 2023-09-30 MED ORDER — HEPARIN SODIUM (PORCINE) 1000 UNIT/ML IJ SOLN
INTRAMUSCULAR | Status: DC | PRN
Start: 2023-09-30 — End: 2023-09-30
  Administered 2023-09-30 (×2): 1600 [IU] via INTRAVENOUS

## 2023-09-30 MED ORDER — HEPARIN (PORCINE) IN NACL 1000-0.9 UT/500ML-% IV SOLN
INTRAVENOUS | Status: DC | PRN
  Administered 2023-09-30: 500 mL

## 2023-09-30 MED ORDER — LIDOCAINE HCL (PF) 1 % IJ SOLN
INTRAMUSCULAR | Status: AC
  Filled 2023-09-30: qty 30

## 2023-09-30 MED ORDER — FENTANYL CITRATE (PF) 100 MCG/2ML IJ SOLN
INTRAMUSCULAR | Status: DC | PRN
  Administered 2023-09-30: 25 ug via INTRAVENOUS

## 2023-09-30 SURGICAL SUPPLY — 6 items
BAG SNAP BAND KOVER 36X36 (MISCELLANEOUS) IMPLANT
CATH ANGIO 5F BER2 65CM (CATHETERS) IMPLANT
CATH PALINDROME-P 19CM W/VT (CATHETERS) IMPLANT
COVER DOME SNAP 22 D (MISCELLANEOUS) IMPLANT
GUIDEWIRE ZIPWIRE 035/150 ANGL (WIRE) IMPLANT
TRAY PV CATH (CUSTOM PROCEDURE TRAY) IMPLANT

## 2023-09-30 NOTE — H&P (Addendum)
 Chief Complaint: Decreased flows  Interval H&P   The patient has presented today for tunneled catheter insertion to initiate dialysis.  Various methods of treatment have been discussed with the patient.  After consideration of risk, benefits and other options for treatment, the patient has consented to a tunneled catheter insertion.   Risks  of bleeding, pain, infection, nonhealing wound, lung and carotid artery injury were explained to the patient.  The patient's history has been reviewed and the patient has been examined, no changes in status.  Stable for tunneled catheter insertion.  I have reviewed the patient's chart and labs.  Questions were answered to the patient's satisfaction.  OP HD: [facility]  4h   117.5kg    3K bath  RDC  B400  Heparin  2500 Mircera 75 mcg q 4 wks Venofer 50mg  weekly  Assessment/Plan: ESRD dialyzing TTS  Decreased access flows and a left sided tunneled dialysis catheter which was exchanged by VIR on September 10, 2023- planning on exchanging catheter.   Renal osteodystrophy - continue binders per home regimen. Anemia - managed with ESA's and IV iron at dialysis center. HTN - resume home regimen. Recent MRSA bacteremia and right leg abscess status post I&D in March 28 with line holiday replaced on April 5.  She is supposed to get 1 g of Ancef  through April 40th to complete 4 weeks from April 2.   HPI: Jasmine [name] is an 53 y.o. female with a history of type 1 diabetes, hypertension, hypothyroidism, hyperlipidemia, bipolar disorder, obstructive sleep apnea, end-stage renal disease on dialysis Tuesday Thursdays and Saturdays at [facility].  Being referred for poor catheter function with indwelling alteplase  placed yesterday.   ROS Per HPI.  Chemistry and CBC: Creatinine, Ser  Date/Time Value Ref Range Status  09/10/2023 11:48 AM 5.01 (H) 0.44 - 1.00 mg/dL Final  40/98/1191 47:82 AM 5.16 (H) 0.44 - 1.00 mg/dL Final  95/62/1308 65:78 AM 3.98 (H)  0.44 - 1.00 mg/dL Final  46/96/2952 84:13 AM 6.35 (H) 0.44 - 1.00 mg/dL Final  24/40/1027 25:36 AM 5.55 (H) 0.44 - 1.00 mg/dL Final  64/40/3474 25:95 AM 4.78 (H) 0.57 - 1.00 mg/dL Final  63/87/5643 32:95 AM 3.46 (H) 0.44 - 1.00 mg/dL Final  18/84/1660 63:01 PM 5.20 (H) 0.44 - 1.00 mg/dL Final  60/03/9322 55:73 PM 4.05 (H) 0.57 - 1.00 mg/dL Final  22/07/5425 06:23 AM 3.61 (H) 0.44 - 1.00 mg/dL Final  76/28/3151 76:16 AM 2.93 (H) 0.44 - 1.00 mg/dL Final    Comment:    DELTA CHECK NOTED  03/29/2023 02:52 PM 1.75 (H) 0.44 - 1.00 mg/dL Final    Comment:    DELTA CHECK NOTED  03/29/2023 10:25 AM 3.10 (H) 0.44 - 1.00 mg/dL Final  07/37/1062 69:48 AM 2.98 (H) 0.44 - 1.00 mg/dL Final  54/62/7035 00:93 AM 2.85 (H) 0.44 - 1.00 mg/dL Final  81/82/9937 16:96 PM 3.21 (H) 0.44 - 1.00 mg/dL Final  78/93/8101 75:10 PM 3.50 (H) 0.44 - 1.00 mg/dL Final  25/85/2778 24:23 PM 2.93 (H) 0.44 - 1.00 mg/dL Final  53/61/4431 54:00 AM 3.81 (H) 0.44 - 1.00 mg/dL Final  86/76/1950 93:26 AM 3.92 (H) 0.44 - 1.00 mg/dL Final  71/24/5809 98:33 PM 3.96 (H) 0.44 - 1.00 mg/dL Final  82/50/5397 67:34 PM 3.62 (H) 0.44 - 1.00 mg/dL Final  19/37/9024 09:73 AM 3.55 (H) 0.44 - 1.00 mg/dL Final  53/29/9242 68:34 AM 3.82 (H) 0.44 - 1.00 mg/dL Final  19/62/2297 98:92 AM 3.57 (H) 0.44 - 1.00  mg/dL Final  16/03/9603 54:09 PM 3.33 (H) 0.44 - 1.00 mg/dL Final  81/19/1478 29:56 PM 1.98 (H) 0.40 - 1.20 mg/dL Final  21/30/8657 84:69 AM 2.12 (H) 0.44 - 1.00 mg/dL Final  62/95/2841 32:44 PM 2.67 (H) 0.44 - 1.00 mg/dL Final  06/09/7251 66:44 PM 2.08 (H) 0.44 - 1.00 mg/dL Final  03/47/4259 56:38 PM 2.33 (H) 0.44 - 1.00 mg/dL Final  75/64/3329 51:88 AM 1.64 (H) 0.44 - 1.00 mg/dL Final  41/66/0630 16:01 PM 1.86 (H) 0.44 - 1.00 mg/dL Final  09/32/3557 32:20 PM 2.06 (H) 0.44 - 1.00 mg/dL Final  25/42/7062 37:62 PM 2.57 (H) 0.44 - 1.00 mg/dL Final  83/15/1761 60:73 PM 2.47 (H) 0.44 - 1.00 mg/dL Final  71/11/2692 85:46 AM 2.50 (H) 0.44 -  1.00 mg/dL Final  27/08/5007 38:18 AM 1.84 (H) 0.44 - 1.00 mg/dL Final  29/93/7169 67:89 AM 2.03 (H) 0.44 - 1.00 mg/dL Final  38/03/1750 02:58 AM 2.00 (H) 0.44 - 1.00 mg/dL Final  52/77/8242 35:36 AM 2.12 (H) 0.44 - 1.00 mg/dL Final  14/43/1540 08:67 AM 2.10 (H) 0.44 - 1.00 mg/dL Final  61/95/0932 67:12 AM 2.33 (H) 0.44 - 1.00 mg/dL Final  45/80/9983 38:25 PM 2.42 (H) 0.44 - 1.00 mg/dL Final  05/39/7673 41:93 AM 2.13 (H) 0.44 - 1.00 mg/dL Final  79/07/4095 35:32 AM 1.61 (H) 0.44 - 1.00 mg/dL Final  99/24/2683 41:96 PM 1.83 (H) 0.44 - 1.00 mg/dL Final  22/29/7989 21:19 AM 1.71 (H) 0.44 - 1.00 mg/dL Final  41/74/0814 48:18 AM 1.69 (H) 0.44 - 1.00 mg/dL Final  56/31/4970 26:37 PM 1.86 (H) 0.44 - 1.00 mg/dL Final  85/88/5027 74:12 AM 1.72 (H) 0.44 - 1.00 mg/dL Final  87/86/7672 09:47 PM 1.71 (H) 0.44 - 1.00 mg/dL Final   No results for input(s): "NA", "K", "CL", "CO2", "GLUCOSE", "BUN", "CREATININE", "CALCIUM ", "PHOS" in the last 168 hours.  Invalid input(s): "ALB" No results for input(s): "WBC", "NEUTROABS", "HGB", "HCT", "MCV", "PLT" in the last 168 hours. Liver Function Tests: No results for input(s): "AST", "ALT", "ALKPHOS", "BILITOT", "PROT", "ALBUMIN" in the last 168 hours. No results for input(s): "LIPASE", "AMYLASE" in the last 168 hours. No results for input(s): "AMMONIA" in the last 168 hours. Cardiac Enzymes: No results for input(s): "CKTOTAL", "CKMB", "CKMBINDEX", "TROPONINI" in the last 168 hours. Iron Studies: No results for input(s): "IRON", "TIBC", "TRANSFERRIN", "FERRITIN" in the last 72 hours. PT/INR: @LABRCNTIP (inr:5)  Xrays/Other Studies: )No results found for this or any previous visit (from the past 48 hours). No results found.  PMH:   Past Medical History:  Diagnosis Date   Anxiety    Arthritis    Asthma    Balance problems    Bipolar disorder (HCC)    Charcot ankle    Chronic fatigue    Depression    Diabetes mellitus    DKA, type 1 (HCC) 11/04/2011    Elevated cholesterol    ESRD on hemodialysis (HCC)    TTS at [facility]   Fibromyalgia    GERD (gastroesophageal reflux disease)    Headache    History of suicidal ideation    Hyperlipemia    Hypertension    Hypothyroidism    IBS (irritable bowel syndrome)    Memory changes    Obesity    Sleep apnea    HAS C -PAP / DOES NOT USE   Stress incontinence    Pt had surgery to correct this.   Tachycardia    Tobacco abuse    Tremor  UTI (lower urinary tract infection)     PSH:   Past Surgical History:  Procedure Laterality Date   CORONARY IMAGING/OCT N/A 08/30/2023   Procedure: CORONARY IMAGING/OCT;  Surgeon: Kyra Phy, MD;  Location: [facility];  Service: Cardiovascular;  Laterality: N/A;   CORONARY STENT INTERVENTION N/A 08/30/2023   Procedure: CORONARY STENT INTERVENTION;  Surgeon: Kyra Phy, MD;  Location: [facility];  Service: Cardiovascular;  Laterality: N/A;   INCONTINENCE SURGERY     IR FLUORO GUIDE CV LINE LEFT  09/10/2023   IR US  GUIDE VASC ACCESS LEFT  09/10/2023   LEFT HEART CATH AND CORONARY ANGIOGRAPHY N/A 07/15/2023   Procedure: LEFT HEART CATH AND CORONARY ANGIOGRAPHY;  Surgeon: Kyra Phy, MD;  Location: [facility];  Service: Cardiovascular;  Laterality: N/A;   LEFT HEART CATH AND CORONARY ANGIOGRAPHY N/A 08/30/2023   Procedure: LEFT HEART CATH AND CORONARY ANGIOGRAPHY;  Surgeon: Kyra Phy, MD;  Location: [facility];  Service: Cardiovascular;  Laterality: N/A;   NASAL FRACTURE SURGERY     ovary removed     OVARY SURGERY     PUBOVAGINAL SLING  08/16/2011   Procedure: Gino Lais;  Surgeon: Livingston Rigg, MD;  Location: [facility];  Service: Urology;  Laterality: N/A;          TEE WITHOUT CARDIOVERSION N/A 09/08/2023   Procedure: ECHOCARDIOGRAM, TRANSESOPHAGEAL;  Surgeon: Laurann Pollock, MD;  Location: [facility];  Service: Endoscopy;  Laterality: N/A;   UTERINE FIBROID SURGERY  2001    Allergies:   Allergies  Allergen Reactions   Ciprofloxacin  Swelling and Other (See Comments)    Per pt caused lips swell and nauseous feeling   Levaquin [Levofloxacin] Swelling and Other (See Comments)    Per pt caused lips swell and nauseous feeling   Linaclotide Other (See Comments)    Cause severe dehydration   Promethazine  Other (See Comments)    Completely wipes out/fatigue   Buspar [Buspirone] Other (See Comments)    abd cramping   Advair Diskus [Fluticasone -Salmeterol] Other (See Comments)    Thrush    Biaxin [Clarithromycin] Rash   Hydroxyzine Palpitations    Medications:   Prior to Admission medications   Medication Sig Start Date End Date Taking? Authorizing Provider  acetaminophen  (TYLENOL ) 500 MG tablet Take 500-1,000 mg by mouth every 6 (six) hours as needed (pain.).    [provider]  aspirin  EC 81 MG tablet Take 1 tablet (81 mg total) by mouth daily with breakfast. Swallow whole. 09/09/23   Colin Dawley, MD  azelastine  (ASTELIN ) 0.1 % nasal spray Place 2 sprays into both nostrils 2 (two) times daily. Use in each nostril as directed Patient taking differently: Place 2 sprays into both nostrils in the morning. Use in each nostril as directed 08/01/23   Antonio Baumgarten, NP  buPROPion  (WELLBUTRIN  XL) 300 MG 24 hr tablet Take 1 tablet (300 mg total) by mouth daily. 09/19/23 03/17/24  Todd Fossa, MD  carvedilol  (COREG ) 12.5 MG tablet Take 12.5 mg by mouth 2 (two) times daily with a meal. 01/17/23 01/17/24  [provider]  Cholecalciferol  (VITAMIN D -3 PO) Take 1 tablet by mouth in the morning.    [provider]  clopidogrel  (PLAVIX ) 75 MG tablet Take 1 tablet (75 mg total) by mouth daily. 02/22/23   Clearnce Curia, NP  Continuous Blood Gluc Sensor MISC 1 each by Does not apply route as directed. Use as directed every  14 days. May dispense FreeStyle [employer] or similar.    [provider]  diclofenac  Sodium (VOLTAREN ) 1 % GEL Apply 2 g  topically 4 (four) times daily as needed (pain.).    [provider]  dicyclomine  (BENTYL ) 10 MG capsule Take 10 mg by mouth 2 (two) times daily as needed for spasms. 12/25/22   [provider]  DULoxetine  (CYMBALTA ) 30 MG capsule Take 1 capsule (30 mg total) by mouth daily. 07/03/23 10/31/23  Todd Fossa, MD  folic acid  (FOLVITE ) 800 MCG tablet Take 800 mcg by mouth in the morning.    [provider]  glucagon (GLUCAGEN  HYPOKIT) 1 MG SOLR injection Inject 1 mg into the skin once as needed for up to 1 dose for low blood sugar. GlucaGen  HypoKit 1 mg Injection 03/31/23   Johnson, Clanford L, MD  hydrALAZINE  (APRESOLINE ) 25 MG tablet Take 25 mg by mouth 2 (two) times daily. 05/03/23   [provider]  insulin  detemir (LEVEMIR ) 100 UNIT/ML injection Inject 27 Units into the skin 2 (two) times daily as needed (if insulin  pump inoperable). 04/04/23   [provider]  lamoTRIgine  (LAMICTAL ) 200 MG tablet Take 1 tablet (200 mg total) by mouth every evening. 10/21/23 04/18/24  Todd Fossa, MD  levothyroxine  (SYNTHROID ) 175 MCG tablet Take 1.5 tablets (mcg) by mouth on Sundays in the mornings & take 1 tablet (175 mcg) by mouth on all other days. 11/20/21   [provider]  LORazepam  (ATIVAN ) 0.5 MG tablet Take 1 tablet (0.5 mg total) by mouth daily as needed for anxiety. 10/12/23 11/11/23  Todd Fossa, MD  Melatonin 10 MG TABS Take 10 mg by mouth at bedtime.    [provider]  NOVOLOG  100 UNIT/ML injection Inject into the skin continuous. Sliding Scale Insulin  pump 2.5 units basal Bolus with meal depending on the size 03/15/22   [provider]  ondansetron  (ZOFRAN -ODT) 4 MG disintegrating tablet Take 4 mg by mouth every 8 (eight) hours as needed for nausea or vomiting. 03/17/22   [provider]  OVER THE COUNTER MEDICATION Apply 1 application  topically at bedtime as needed (Sleep). CBD oil    [provider]  OXYGEN  Inhale 3 L into the lungs at bedtime.    [provider]  PRESCRIPTION MEDICATION See admin instructions.  IDPN LV CUSTOM (100GM)   INFUSE 1 BAG DURING HD 3 TIMES WEEKLY AS FOLLOWS 1ST WEEK 55ML/HR; 2ND WEEK 110ML/HR; 3RD WEEK 163ML/HR; THEN CONTINUE WITH GOAL RATE= 163ML/HR OVER 3HRS . TITRATE AS TOLERATED. ADMINISTER IV VIA VENOUS DRIP CHAMBER.    [provider]  rosuvastatin  (CRESTOR ) 10 MG tablet Take 1 tablet (10 mg total) by mouth daily. Patient taking differently: Take 10 mg by mouth every evening. 07/29/23 10/27/23  Sheryle Donning, MD  sevelamer  carbonate (RENVELA ) 800 MG tablet Take 1,600 mg by mouth 3 (three) times daily with meals. 07/28/23   [provider]  torsemide  (DEMADEX ) 20 MG tablet TAKE TWO TABLETS BY MOUTH AS DIRECTED ON non dialysis DAYS    [provider]    Discontinued Meds:  There are no discontinued medications.  Social History:  reports that she quit smoking about 12 years ago. Her smoking use included cigarettes. She started smoking about 32 years ago. She has a 15 pack-year smoking history. She has never used smokeless tobacco. She reports that she does not drink alcohol and does not use drugs.  Family History:   Family History  Problem  Relation Age of Onset   Asthma Mother    Bipolar disorder Mother    Heart disease Father    Lymphoma Father    Hypertension Father    Thyroid  disease Father    Hyperlipidemia Father    Diabetes Father    Cancer Paternal Grandmother        lung and breast   Bladder Cancer Paternal Grandfather    Suicidality Maternal Grandfather    Thyroid  disease Brother     Last menstrual period 03/23/2017. General: alert female  ,nad Heart: RRR, no MRG Lungs: CTA  nonlabored Abdomen: NABS , soft  ND Extremities:  no pedal edema Dialysis Access: L internal jugular TDC        Gabby Rackers, Alveda Aures, MD 09/30/2023, 10:21 AM

## 2023-09-30 NOTE — Op Note (Signed)
 Patient presents with poor flows in her left  IJ tunneled hemodialysis catheter (19 cm cuff to tip- Palindrome) that was placed here by [facility]IR on 09/10/2023. On examination, aspiration from both ports is poor.  Cuff was already outside the exit site no discharge noted.  Patient is 5 foot 9 inches..  Summary:  1) The patient had a successful 19 cm CTT Palindrome hemodialysis catheter exchange in the left internal jugular vein. 2) No sheath noted through either port; good flush and return through the blue port, poor return but good flush to the red port.  Please reverse lines and use the blue side for arterial inflow when she is on dialysis and return through the red.  There are still issues I would just place a new catheter on the right side.  Otherwise we need to use a 23 cm on the left side and likely will need be tunneling as well.  Currently very short tunnel. 3) Okay to use catheter immediately; reverse lines.  Description of procedure: The left neck, chest and the catheter were prepped and draped in the usual sterile fashion. The exit site and adjacent tunnel tract were anesthetized with lidocaine  1% with epinephrine . The cuff was the external to the exit site with no discharge noted. The catheter was withdrawn and venogram through each of the ports was performed; there was no fibrin sheath evident through either port.   A hydrophilic wire was manipulated and advanced through the arterial port and the tip was parked in the IVC with the aid of a Bernstein catheter only after the catheter was removed. The catheter was completely removed and noted to be entirely intact. A new 19 cm cuff to tip Palindrome catheter was inserted over the guidewire and the tip parked high in the right atrium and at that point the cuff was approximately 2-2.5 cm deep to the exit site.   Aspiration and flushing of the blue limb of the catheter confirmed excellent flow; good flush through the red limb. No kinks were visible on  fluoroscopic imaging. Both limbs of the catheter were locked with citrate and sterile caps were placed.   The hub was secured on to the chest wall with 2-0 nylon wing sutures.  Sterile dressings were placed, and the patient returned to recovery in stable condition.  Sedation: 1  mg Versed , 25  mcg Fentanyl  Sedation time: 24 minutes  Contrast: 2 mL  Monitoring: Because of the patient's comorbid conditions and sedation during the procedure, continuous EKG monitoring and O2 saturation monitoring was performed throughout the procedure by the RN. There were no abnormal arrhythmias encountered.  Complications: None.  Diagnoses:   T82.49XA Other complication of vascular dialysis catheter (Poor flows) N18.6 End stage renal disease  Z99.2 Dialysis dependence  Procedures Coding:  36581 Tunneled catheter exchange 77001  Fluoroscopy guidance for catheter exchange. Z6109 Contrast  Recommendations: Remove the suture in 3 weeks. 2.   Report any blood flow problems to [facility].  Discharge: The patient was discharged home in stable condition. The patient was given education regarding the care of the catheter and specific instructions in case of any problems.

## 2023-09-30 NOTE — H&P (View-Only) (Signed)
 Chief Complaint: Decreased flows  Interval H&P   The patient has presented today for tunneled catheter insertion to initiate dialysis.  Various methods of treatment have been discussed with the patient.  After consideration of risk, benefits and other options for treatment, the patient has consented to a tunneled catheter insertion.   Risks  of bleeding, pain, infection, nonhealing wound, lung and carotid artery injury were explained to the patient.  The patient's history has been reviewed and the patient has been examined, no changes in status.  Stable for tunneled catheter insertion.  I have reviewed the patient's chart and labs.  Questions were answered to the patient's satisfaction.  OP HD: [facility]  4h   117.5kg    3K bath  RDC  B400  Heparin  2500 Mircera 75 mcg q 4 wks Venofer 50mg  weekly  Assessment/Plan: ESRD dialyzing TTS  Decreased access flows and a left sided tunneled dialysis catheter which was exchanged by VIR on September 10, 2023- planning on exchanging catheter.   Renal osteodystrophy - continue binders per home regimen. Anemia - managed with ESA's and IV iron at dialysis center. HTN - resume home regimen. Recent MRSA bacteremia and right leg abscess status post I&D in March 28 with line holiday replaced on April 5.  She is supposed to get 1 g of Ancef  through April 40th to complete 4 weeks from April 2.   HPI: Jasmine [name] is an 53 y.o. female with a history of type 1 diabetes, hypertension, hypothyroidism, hyperlipidemia, bipolar disorder, obstructive sleep apnea, end-stage renal disease on dialysis Tuesday Thursdays and Saturdays at [facility].  Being referred for poor catheter function with indwelling alteplase  placed yesterday.   ROS Per HPI.  Chemistry and CBC: Creatinine, Ser  Date/Time Value Ref Range Status  09/10/2023 11:48 AM 5.01 (H) 0.44 - 1.00 mg/dL Final  40/98/1191 47:82 AM 5.16 (H) 0.44 - 1.00 mg/dL Final  95/62/1308 65:78 AM 3.98 (H)  0.44 - 1.00 mg/dL Final  46/96/2952 84:13 AM 6.35 (H) 0.44 - 1.00 mg/dL Final  24/40/1027 25:36 AM 5.55 (H) 0.44 - 1.00 mg/dL Final  64/40/3474 25:95 AM 4.78 (H) 0.57 - 1.00 mg/dL Final  63/87/5643 32:95 AM 3.46 (H) 0.44 - 1.00 mg/dL Final  18/84/1660 63:01 PM 5.20 (H) 0.44 - 1.00 mg/dL Final  60/03/9322 55:73 PM 4.05 (H) 0.57 - 1.00 mg/dL Final  22/07/5425 06:23 AM 3.61 (H) 0.44 - 1.00 mg/dL Final  76/28/3151 76:16 AM 2.93 (H) 0.44 - 1.00 mg/dL Final    Comment:    DELTA CHECK NOTED  03/29/2023 02:52 PM 1.75 (H) 0.44 - 1.00 mg/dL Final    Comment:    DELTA CHECK NOTED  03/29/2023 10:25 AM 3.10 (H) 0.44 - 1.00 mg/dL Final  07/37/1062 69:48 AM 2.98 (H) 0.44 - 1.00 mg/dL Final  54/62/7035 00:93 AM 2.85 (H) 0.44 - 1.00 mg/dL Final  81/82/9937 16:96 PM 3.21 (H) 0.44 - 1.00 mg/dL Final  78/93/8101 75:10 PM 3.50 (H) 0.44 - 1.00 mg/dL Final  25/85/2778 24:23 PM 2.93 (H) 0.44 - 1.00 mg/dL Final  53/61/4431 54:00 AM 3.81 (H) 0.44 - 1.00 mg/dL Final  86/76/1950 93:26 AM 3.92 (H) 0.44 - 1.00 mg/dL Final  71/24/5809 98:33 PM 3.96 (H) 0.44 - 1.00 mg/dL Final  82/50/5397 67:34 PM 3.62 (H) 0.44 - 1.00 mg/dL Final  19/37/9024 09:73 AM 3.55 (H) 0.44 - 1.00 mg/dL Final  53/29/9242 68:34 AM 3.82 (H) 0.44 - 1.00 mg/dL Final  19/62/2297 98:92 AM 3.57 (H) 0.44 - 1.00  mg/dL Final  16/03/9603 54:09 PM 3.33 (H) 0.44 - 1.00 mg/dL Final  81/19/1478 29:56 PM 1.98 (H) 0.40 - 1.20 mg/dL Final  21/30/8657 84:69 AM 2.12 (H) 0.44 - 1.00 mg/dL Final  62/95/2841 32:44 PM 2.67 (H) 0.44 - 1.00 mg/dL Final  06/09/7251 66:44 PM 2.08 (H) 0.44 - 1.00 mg/dL Final  03/47/4259 56:38 PM 2.33 (H) 0.44 - 1.00 mg/dL Final  75/64/3329 51:88 AM 1.64 (H) 0.44 - 1.00 mg/dL Final  41/66/0630 16:01 PM 1.86 (H) 0.44 - 1.00 mg/dL Final  09/32/3557 32:20 PM 2.06 (H) 0.44 - 1.00 mg/dL Final  25/42/7062 37:62 PM 2.57 (H) 0.44 - 1.00 mg/dL Final  83/15/1761 60:73 PM 2.47 (H) 0.44 - 1.00 mg/dL Final  71/11/2692 85:46 AM 2.50 (H) 0.44 -  1.00 mg/dL Final  27/08/5007 38:18 AM 1.84 (H) 0.44 - 1.00 mg/dL Final  29/93/7169 67:89 AM 2.03 (H) 0.44 - 1.00 mg/dL Final  38/03/1750 02:58 AM 2.00 (H) 0.44 - 1.00 mg/dL Final  52/77/8242 35:36 AM 2.12 (H) 0.44 - 1.00 mg/dL Final  14/43/1540 08:67 AM 2.10 (H) 0.44 - 1.00 mg/dL Final  61/95/0932 67:12 AM 2.33 (H) 0.44 - 1.00 mg/dL Final  45/80/9983 38:25 PM 2.42 (H) 0.44 - 1.00 mg/dL Final  05/39/7673 41:93 AM 2.13 (H) 0.44 - 1.00 mg/dL Final  79/07/4095 35:32 AM 1.61 (H) 0.44 - 1.00 mg/dL Final  99/24/2683 41:96 PM 1.83 (H) 0.44 - 1.00 mg/dL Final  22/29/7989 21:19 AM 1.71 (H) 0.44 - 1.00 mg/dL Final  41/74/0814 48:18 AM 1.69 (H) 0.44 - 1.00 mg/dL Final  56/31/4970 26:37 PM 1.86 (H) 0.44 - 1.00 mg/dL Final  85/88/5027 74:12 AM 1.72 (H) 0.44 - 1.00 mg/dL Final  87/86/7672 09:47 PM 1.71 (H) 0.44 - 1.00 mg/dL Final   No results for input(s): "NA", "K", "CL", "CO2", "GLUCOSE", "BUN", "CREATININE", "CALCIUM ", "PHOS" in the last 168 hours.  Invalid input(s): "ALB" No results for input(s): "WBC", "NEUTROABS", "HGB", "HCT", "MCV", "PLT" in the last 168 hours. Liver Function Tests: No results for input(s): "AST", "ALT", "ALKPHOS", "BILITOT", "PROT", "ALBUMIN" in the last 168 hours. No results for input(s): "LIPASE", "AMYLASE" in the last 168 hours. No results for input(s): "AMMONIA" in the last 168 hours. Cardiac Enzymes: No results for input(s): "CKTOTAL", "CKMB", "CKMBINDEX", "TROPONINI" in the last 168 hours. Iron Studies: No results for input(s): "IRON", "TIBC", "TRANSFERRIN", "FERRITIN" in the last 72 hours. PT/INR: @LABRCNTIP (inr:5)  Xrays/Other Studies: )No results found for this or any previous visit (from the past 48 hours). No results found.  PMH:   Past Medical History:  Diagnosis Date   Anxiety    Arthritis    Asthma    Balance problems    Bipolar disorder (HCC)    Charcot ankle    Chronic fatigue    Depression    Diabetes mellitus    DKA, type 1 (HCC) 11/04/2011    Elevated cholesterol    ESRD on hemodialysis (HCC)    TTS at [facility]   Fibromyalgia    GERD (gastroesophageal reflux disease)    Headache    History of suicidal ideation    Hyperlipemia    Hypertension    Hypothyroidism    IBS (irritable bowel syndrome)    Memory changes    Obesity    Sleep apnea    HAS C -PAP / DOES NOT USE   Stress incontinence    Pt had surgery to correct this.   Tachycardia    Tobacco abuse    Tremor  UTI (lower urinary tract infection)     PSH:   Past Surgical History:  Procedure Laterality Date   CORONARY IMAGING/OCT N/A 08/30/2023   Procedure: CORONARY IMAGING/OCT;  Surgeon: Kyra Phy, MD;  Location: [facility]VAS[facility];  Service: Cardiovascular;  Laterality: N/A;   CORONARY STENT INTERVENTION N/A 08/30/2023   Procedure: CORONARY STENT INTERVENTION;  Surgeon: Kyra Phy, MD;  Location: [facility];  Service: Cardiovascular;  Laterality: N/A;   INCONTINENCE SURGERY     IR FLUORO GUIDE CV LINE LEFT  09/10/2023   IR US  GUIDE VASC ACCESS LEFT  09/10/2023   LEFT HEART CATH AND CORONARY ANGIOGRAPHY N/A 07/15/2023   Procedure: LEFT HEART CATH AND CORONARY ANGIOGRAPHY;  Surgeon: Kyra Phy, MD;  Location: [facility];  Service: Cardiovascular;  Laterality: N/A;   LEFT HEART CATH AND CORONARY ANGIOGRAPHY N/A 08/30/2023   Procedure: LEFT HEART CATH AND CORONARY ANGIOGRAPHY;  Surgeon: Kyra Phy, MD;  Location: [facility];  Service: Cardiovascular;  Laterality: N/A;   NASAL FRACTURE SURGERY     ovary removed     OVARY SURGERY     PUBOVAGINAL SLING  08/16/2011   Procedure: Gino Lais;  Surgeon: Livingston Rigg, MD;  Location: [facility];  Service: Urology;  Laterality: N/A;          TEE WITHOUT CARDIOVERSION N/A 09/08/2023   Procedure: ECHOCARDIOGRAM, TRANSESOPHAGEAL;  Surgeon: Laurann Pollock, MD;  Location: [facility];  Service: Endoscopy;  Laterality: N/A;   UTERINE FIBROID SURGERY  2001    Allergies:   Allergies  Allergen Reactions   Ciprofloxacin  Swelling and Other (See Comments)    Per pt caused lips swell and nauseous feeling   Levaquin [Levofloxacin] Swelling and Other (See Comments)    Per pt caused lips swell and nauseous feeling   Linaclotide Other (See Comments)    Cause severe dehydration   Promethazine  Other (See Comments)    Completely wipes out/fatigue   Buspar [Buspirone] Other (See Comments)    abd cramping   Advair Diskus [Fluticasone -Salmeterol] Other (See Comments)    Thrush    Biaxin [Clarithromycin] Rash   Hydroxyzine Palpitations    Medications:   Prior to Admission medications   Medication Sig Start Date End Date Taking? Authorizing Provider  acetaminophen  (TYLENOL ) 500 MG tablet Take 500-1,000 mg by mouth every 6 (six) hours as needed (pain.).    [provider]  aspirin  EC 81 MG tablet Take 1 tablet (81 mg total) by mouth daily with breakfast. Swallow whole. 09/09/23   Colin Dawley, MD  azelastine  (ASTELIN ) 0.1 % nasal spray Place 2 sprays into both nostrils 2 (two) times daily. Use in each nostril as directed Patient taking differently: Place 2 sprays into both nostrils in the morning. Use in each nostril as directed 08/01/23   Antonio Baumgarten, NP  buPROPion  (WELLBUTRIN  XL) 300 MG 24 hr tablet Take 1 tablet (300 mg total) by mouth daily. 09/19/23 03/17/24  Todd Fossa, MD  carvedilol  (COREG ) 12.5 MG tablet Take 12.5 mg by mouth 2 (two) times daily with a meal. 01/17/23 01/17/24  [provider]  Cholecalciferol  (VITAMIN D -3 PO) Take 1 tablet by mouth in the morning.    [provider]  clopidogrel  (PLAVIX ) 75 MG tablet Take 1 tablet (75 mg total) by mouth daily. 02/22/23   Clearnce Curia, NP  Continuous Blood Gluc Sensor MISC 1 each by Does not apply route as directed. Use as directed every  14 days. May dispense FreeStyle [employer] or similar.    [provider]  diclofenac  Sodium (VOLTAREN ) 1 % GEL Apply 2 g  topically 4 (four) times daily as needed (pain.).    [provider]  dicyclomine  (BENTYL ) 10 MG capsule Take 10 mg by mouth 2 (two) times daily as needed for spasms. 12/25/22   [provider]  DULoxetine  (CYMBALTA ) 30 MG capsule Take 1 capsule (30 mg total) by mouth daily. 07/03/23 10/31/23  Todd Fossa, MD  folic acid  (FOLVITE ) 800 MCG tablet Take 800 mcg by mouth in the morning.    [provider]  glucagon (GLUCAGEN  HYPOKIT) 1 MG SOLR injection Inject 1 mg into the skin once as needed for up to 1 dose for low blood sugar. GlucaGen  HypoKit 1 mg Injection 03/31/23   Johnson, Clanford L, MD  hydrALAZINE  (APRESOLINE ) 25 MG tablet Take 25 mg by mouth 2 (two) times daily. 05/03/23   [provider]  insulin  detemir (LEVEMIR ) 100 UNIT/ML injection Inject 27 Units into the skin 2 (two) times daily as needed (if insulin  pump inoperable). 04/04/23   [provider]  lamoTRIgine  (LAMICTAL ) 200 MG tablet Take 1 tablet (200 mg total) by mouth every evening. 10/21/23 04/18/24  Todd Fossa, MD  levothyroxine  (SYNTHROID ) 175 MCG tablet Take 1.5 tablets (mcg) by mouth on Sundays in the mornings & take 1 tablet (175 mcg) by mouth on all other days. 11/20/21   [provider]  LORazepam  (ATIVAN ) 0.5 MG tablet Take 1 tablet (0.5 mg total) by mouth daily as needed for anxiety. 10/12/23 11/11/23  Todd Fossa, MD  Melatonin 10 MG TABS Take 10 mg by mouth at bedtime.    [provider]  NOVOLOG  100 UNIT/ML injection Inject into the skin continuous. Sliding Scale Insulin  pump 2.5 units basal Bolus with meal depending on the size 03/15/22   [provider]  ondansetron  (ZOFRAN -ODT) 4 MG disintegrating tablet Take 4 mg by mouth every 8 (eight) hours as needed for nausea or vomiting. 03/17/22   [provider]  OVER THE COUNTER MEDICATION Apply 1 application  topically at bedtime as needed (Sleep). CBD oil    [provider]  OXYGEN  Inhale 3 L into the lungs at bedtime.    [provider]  PRESCRIPTION MEDICATION See admin instructions.  IDPN LV CUSTOM (100GM)   INFUSE 1 BAG DURING HD 3 TIMES WEEKLY AS FOLLOWS 1ST WEEK 55ML/HR; 2ND WEEK 110ML/HR; 3RD WEEK 163ML/HR; THEN CONTINUE WITH GOAL RATE= 163ML/HR OVER 3HRS . TITRATE AS TOLERATED. ADMINISTER IV VIA VENOUS DRIP CHAMBER.    [provider]  rosuvastatin  (CRESTOR ) 10 MG tablet Take 1 tablet (10 mg total) by mouth daily. Patient taking differently: Take 10 mg by mouth every evening. 07/29/23 10/27/23  Sheryle Donning, MD  sevelamer  carbonate (RENVELA ) 800 MG tablet Take 1,600 mg by mouth 3 (three) times daily with meals. 07/28/23   [provider]  torsemide  (DEMADEX ) 20 MG tablet TAKE TWO TABLETS BY MOUTH AS DIRECTED ON non dialysis DAYS    [provider]    Discontinued Meds:  There are no discontinued medications.  Social History:  reports that she quit smoking about 12 years ago. Her smoking use included cigarettes. She started smoking about 32 years ago. She has a 15 pack-year smoking history. She has never used smokeless tobacco. She reports that she does not drink alcohol and does not use drugs.  Family History:   Family History  Problem  Relation Age of Onset   Asthma Mother    Bipolar disorder Mother    Heart disease Father    Lymphoma Father    Hypertension Father    Thyroid  disease Father    Hyperlipidemia Father    Diabetes Father    Cancer Paternal Grandmother        lung and breast   Bladder Cancer Paternal Grandfather    Suicidality Maternal Grandfather    Thyroid  disease Brother     Last menstrual period 03/23/2017. General: alert female  ,nad Heart: RRR, no MRG Lungs: CTA  nonlabored Abdomen: NABS , soft  ND Extremities:  no pedal edema Dialysis Access: L internal jugular TDC        Jarmel Linhardt, Alveda Aures, MD 09/30/2023, 10:21 AM

## 2023-09-30 NOTE — Discharge Instructions (Signed)

## 2023-10-02 ENCOUNTER — Encounter (HOSPITAL_COMMUNITY): Payer: Self-pay | Admitting: Nephrology

## 2023-10-03 ENCOUNTER — Encounter: Payer: Self-pay | Admitting: Surgery

## 2023-10-03 ENCOUNTER — Ambulatory Visit: Admitting: Surgery

## 2023-10-03 ENCOUNTER — Other Ambulatory Visit: Payer: Self-pay

## 2023-10-03 ENCOUNTER — Ambulatory Visit: Attending: Surgery | Admitting: Surgery

## 2023-10-03 ENCOUNTER — Telehealth: Payer: Self-pay

## 2023-10-03 ENCOUNTER — Encounter (HOSPITAL_COMMUNITY)

## 2023-10-03 VITALS — BP 138/74 | HR 65 | Temp 98.3°F | Resp 20 | Ht 69.0 in | Wt 252.0 lb

## 2023-10-03 DIAGNOSIS — Z992 Dependence on renal dialysis: Secondary | ICD-10-CM

## 2023-10-03 DIAGNOSIS — N186 End stage renal disease: Secondary | ICD-10-CM

## 2023-10-03 NOTE — Progress Notes (Signed)
 [facility]  Patient name: Jasmine [name] MRN: [id number] DOB: 10/19/1970 Sex: female   REASON FOR VISIT:    Follow up  HISOTRY OF PRESENT ILLNESS:    Jasmine [name] is a 53 y.o. female, who is referred for dialysis access.  Her renal failure secondary to diabetes and hypertension.  She is right-handed.  She does not have a pacemaker or defibrillator.   Patient suffers from diastolic heart failure.  She suffered a acute hypoxic respiratory failure and DKA that ultimately led to her starting dialysis and August 2024.  She was scheduled for peritoneal dialysis and fistula placement after her last visit.  This has not occurred.  She has subsequently had a coronary stent placed back in March 2025.  She is on dual antiplatelet therapy.  She is unsure if she is going to be able to do peritoneal dialysis because of insurance reasons.   PAST MEDICAL HISTORY:   Past Medical History:  Diagnosis Date   Anxiety    Arthritis    Asthma    Balance problems    Bipolar disorder (HCC)    Charcot ankle    Chronic fatigue    Depression    Diabetes mellitus    DKA, type 1 (HCC) 11/04/2011   Elevated cholesterol    ESRD on hemodialysis (HCC)    TTS at [facility]   Fibromyalgia    GERD (gastroesophageal reflux disease)    Headache    History of suicidal ideation    Hyperlipemia    Hypertension    Hypothyroidism    IBS (irritable bowel syndrome)    Memory changes    Obesity    Sleep apnea    HAS C -PAP / DOES NOT USE   Stress incontinence    Pt had surgery to correct this.   Tachycardia    Tobacco abuse    Tremor    UTI (lower urinary tract infection)      FAMILY HISTORY:   Family History  Problem Relation Age of Onset   Asthma Mother    Bipolar disorder Mother    Heart disease Father    Lymphoma Father    Hypertension Father    Thyroid  disease Father    Hyperlipidemia Father    Diabetes Father     Cancer Paternal Grandmother        lung and breast   Bladder Cancer Paternal Grandfather    Suicidality Maternal Grandfather    Thyroid  disease Brother     SOCIAL HISTORY:   Social History   Tobacco Use   Smoking status: Former    Current packs/day: 0.00    Average packs/day: 0.8 packs/day for 20.0 years (15.0 ttl pk-yrs)    Types: Cigarettes    Start date: 06/08/1991    Quit date: 06/08/2011    Years since quitting: 12.3   Smokeless tobacco: Never  Substance Use Topics   Alcohol use: No     ALLERGIES:   Allergies  Allergen Reactions   Ciprofloxacin  Swelling and Other (See Comments)    Per pt caused lips swell and nauseous feeling   Levaquin [Levofloxacin] Swelling and Other (See Comments)    Per pt caused lips swell and nauseous feeling   Linaclotide Other (See Comments)    Cause severe dehydration   Promethazine  Other (See Comments)    Completely wipes out/fatigue   Buspar [Buspirone] Other (See Comments)    abd cramping   Advair Diskus [Fluticasone -Salmeterol] Other (See Comments)  Thrush    Biaxin [Clarithromycin] Rash   Hydroxyzine Palpitations     CURRENT MEDICATIONS:   Current Outpatient Medications  Medication Sig Dispense Refill   acetaminophen  (TYLENOL ) 500 MG tablet Take 500-1,000 mg by mouth every 6 (six) hours as needed (pain.).     aspirin  EC 81 MG tablet Take 1 tablet (81 mg total) by mouth daily with breakfast. Swallow whole. 30 tablet 12   azelastine  (ASTELIN ) 0.1 % nasal spray Place 2 sprays into both nostrils 2 (two) times daily. Use in each nostril as directed (Patient taking differently: Place 2 sprays into both nostrils in the morning. Use in each nostril as directed) 30 mL 2   buPROPion  (WELLBUTRIN  XL) 300 MG 24 hr tablet Take 1 tablet (300 mg total) by mouth daily. 30 tablet 5   carvedilol  (COREG ) 12.5 MG tablet Take 12.5 mg by mouth 2 (two) times daily with a meal.     Cholecalciferol  (VITAMIN D -3 PO) Take 1 tablet by mouth in the morning.      clopidogrel  (PLAVIX ) 75 MG tablet Take 1 tablet (75 mg total) by mouth daily. 30 tablet 5   Continuous Blood Gluc Sensor MISC 1 each by Does not apply route as directed. Use as directed every 14 days. May dispense FreeStyle [employer] or similar.     diclofenac  Sodium (VOLTAREN ) 1 % GEL Apply 2 g topically 4 (four) times daily as needed (pain.).     dicyclomine  (BENTYL ) 10 MG capsule Take 10 mg by mouth 2 (two) times daily as needed for spasms.     DULoxetine  (CYMBALTA ) 30 MG capsule Take 1 capsule (30 mg total) by mouth daily. 30 capsule 3   folic acid  (FOLVITE ) 800 MCG tablet Take 800 mcg by mouth in the morning.     glucagon (GLUCAGEN  HYPOKIT) 1 MG SOLR injection Inject 1 mg into the skin once as needed for up to 1 dose for low blood sugar. GlucaGen  HypoKit 1 mg Injection 1 each 3   hydrALAZINE  (APRESOLINE ) 25 MG tablet Take 25 mg by mouth 2 (two) times daily.     insulin  detemir (LEVEMIR ) 100 UNIT/ML injection Inject 27 Units into the skin 2 (two) times daily as needed (if insulin  pump inoperable).     [START ON 10/21/2023] lamoTRIgine  (LAMICTAL ) 200 MG tablet Take 1 tablet (200 mg total) by mouth every evening. 30 tablet 5   levothyroxine  (SYNTHROID ) 175 MCG tablet Take 1.5 tablets (mcg) by mouth on Sundays in the mornings & take 1 tablet (175 mcg) by mouth on all other days.     [START ON 10/12/2023] LORazepam  (ATIVAN ) 0.5 MG tablet Take 1 tablet (0.5 mg total) by mouth daily as needed for anxiety. 30 tablet 0   Melatonin 10 MG TABS Take 10 mg by mouth at bedtime.     NOVOLOG  100 UNIT/ML injection Inject into the skin continuous. Sliding Scale Insulin  pump 2.5 units basal Bolus with meal depending on the size     ondansetron  (ZOFRAN -ODT) 4 MG disintegrating tablet Take 4 mg by mouth every 8 (eight) hours as needed for nausea or vomiting.     OVER THE COUNTER MEDICATION Apply 1 application  topically at bedtime as needed (Sleep). CBD oil     OXYGEN Inhale 3 L into the lungs at  bedtime.     PRESCRIPTION MEDICATION See admin instructions.  IDPN LV CUSTOM (100GM)   INFUSE 1 BAG DURING HD 3 TIMES WEEKLY AS FOLLOWS 1ST WEEK 55ML/HR; 2ND WEEK 110ML/HR; 3RD  WEEK 163ML/HR; THEN CONTINUE WITH GOAL RATE= 163ML/HR OVER 3HRS . TITRATE AS TOLERATED. ADMINISTER IV VIA VENOUS DRIP CHAMBER.     rosuvastatin  (CRESTOR ) 10 MG tablet Take 1 tablet (10 mg total) by mouth daily. (Patient taking differently: Take 10 mg by mouth every evening.) 90 tablet 3   sevelamer  carbonate (RENVELA ) 800 MG tablet Take 1,600 mg by mouth 3 (three) times daily with meals.     torsemide  (DEMADEX ) 20 MG tablet TAKE TWO TABLETS BY MOUTH AS DIRECTED ON non dialysis DAYS     No current facility-administered medications for this visit.    REVIEW OF SYSTEMS:   [X]  denotes positive finding, [ ]  denotes negative finding Cardiac  Comments:  Chest pain or chest pressure:    Shortness of breath upon exertion:    Short of breath when lying flat:    Irregular heart rhythm:        Vascular    Pain in calf, thigh, or hip brought on by ambulation:    Pain in feet at night that wakes you up from your sleep:     Blood clot in your veins:    Leg swelling:         Pulmonary    Oxygen at home:    Productive cough:     Wheezing:         Neurologic    Sudden weakness in arms or legs:     Sudden numbness in arms or legs:     Sudden onset of difficulty speaking or slurred speech:    Temporary loss of vision in one eye:     Problems with dizziness:         Gastrointestinal    Blood in stool:     Vomited blood:         Genitourinary    Burning when urinating:     Blood in urine:        Psychiatric    Major depression:         Hematologic    Bleeding problems:    Problems with blood clotting too easily:        Skin    Rashes or ulcers:        Constitutional    Fever or chills:      PHYSICAL EXAM:   Vitals:   10/03/23 0916  BP: 138/74  Pulse: 65  Resp: 20  Temp: 98.3 F (36.8  C)  SpO2: 96%  Weight: 252 lb (114.3 kg)  Height: 5\' 9"  (1.753 m)    GENERAL: The patient is a well-nourished female, in no acute distress. The vital signs are documented above. CARDIAC: There is a regular rate and rhythm.  VASCULAR: Palpable radial pulse PULMONARY: Non-labored respirations ABDOMEN: Soft and non-tender with normal pitched bowel sounds.  MUSCULOSKELETAL: There are no major deformities or cyanosis. NEUROLOGIC: No focal weakness or paresthesias are detected. SKIN: There are no ulcers or rashes noted. PSYCHIATRIC: The patient has a normal affect.  STUDIES:   I have reviewed the following:  Left Cephalic    Diameter (cm)Depth (cm)Findings  +-----------------+-------------+----------+--------+  Shoulder            0.31                         +-----------------+-------------+----------+--------+  Prox upper arm       0.33                         +-----------------+-------------+----------+--------+  Mid upper arm        0.33                         +-----------------+-------------+----------+--------+  Dist upper arm       0.37                         +-----------------+-------------+----------+--------+  Antecubital fossa    0.42                         +-----------------+-------------+----------+--------+  Prox forearm         0.29                         +-----------------+-------------+----------+--------+  Mid forearm          0.25                         +-----------------+-------------+----------+--------+  Dist forearm         0.18                         +-----------------+-------------+----------+--------+   +-----------------+-------------+----------+--------+  Left Basilic     Diameter (cm)Depth (cm)Findings  +-----------------+-------------+----------+--------+  Mid upper arm        0.51                         +-----------------+-------------+----------+--------+  Dist upper arm       0.43                          +-----------------+-------------+----------+--------+  Antecubital fossa    0.24                         +-----------------+-------------+----------+--------+   MEDICAL ISSUES:   End-stage renal disease: The patient is currently dialyzing through a catheter.  She recently underwent cardiac catheterization and PCI and is on dual antiplatelet therapy.  For that reason I am uncomfortable placing a peritoneal catheter while on dual antiplatelet therapy.  In addition, she is unsure whether or not she is going to qualify for peritoneal dialysis because of insurance issues.  Therefore think the best course of action is to proceed with a left arm fistula.  I discussed that I would initially attempt a left brachiocephalic fistula assuming her vein is adequate.  If the vein is not adequate I would use her basilic vein understanding that this would be a two-stage procedure.  I also stated that she may require elevation of a brachiocephalic fistula.  I would not stop her antiplatelet therapy and we will schedule her fistula in the near future.    Marti Slates, MD, FACS [facility] [phone] Pager [phone]

## 2023-10-03 NOTE — Telephone Encounter (Signed)
 Attempted to call for surgery scheduling. LVM

## 2023-10-03 NOTE — H&P (View-Only) (Signed)
 [facility]  Patient name: Jasmine [name] MRN: [id number] DOB: 10/19/1970 Sex: female   REASON FOR VISIT:    Follow up  HISOTRY OF PRESENT ILLNESS:    Jasmine [name] is a 53 y.o. female, who is referred for dialysis access.  Her renal failure secondary to diabetes and hypertension.  She is right-handed.  She does not have a pacemaker or defibrillator.   Patient suffers from diastolic heart failure.  She suffered a acute hypoxic respiratory failure and DKA that ultimately led to her starting dialysis and August 2024.  She was scheduled for peritoneal dialysis and fistula placement after her last visit.  This has not occurred.  She has subsequently had a coronary stent placed back in March 2025.  She is on dual antiplatelet therapy.  She is unsure if she is going to be able to do peritoneal dialysis because of insurance reasons.   PAST MEDICAL HISTORY:   Past Medical History:  Diagnosis Date   Anxiety    Arthritis    Asthma    Balance problems    Bipolar disorder (HCC)    Charcot ankle    Chronic fatigue    Depression    Diabetes mellitus    DKA, type 1 (HCC) 11/04/2011   Elevated cholesterol    ESRD on hemodialysis (HCC)    TTS at [facility]   Fibromyalgia    GERD (gastroesophageal reflux disease)    Headache    History of suicidal ideation    Hyperlipemia    Hypertension    Hypothyroidism    IBS (irritable bowel syndrome)    Memory changes    Obesity    Sleep apnea    HAS C -PAP / DOES NOT USE   Stress incontinence    Pt had surgery to correct this.   Tachycardia    Tobacco abuse    Tremor    UTI (lower urinary tract infection)      FAMILY HISTORY:   Family History  Problem Relation Age of Onset   Asthma Mother    Bipolar disorder Mother    Heart disease Father    Lymphoma Father    Hypertension Father    Thyroid  disease Father    Hyperlipidemia Father    Diabetes Father     Cancer Paternal Grandmother        lung and breast   Bladder Cancer Paternal Grandfather    Suicidality Maternal Grandfather    Thyroid  disease Brother     SOCIAL HISTORY:   Social History   Tobacco Use   Smoking status: Former    Current packs/day: 0.00    Average packs/day: 0.8 packs/day for 20.0 years (15.0 ttl pk-yrs)    Types: Cigarettes    Start date: 06/08/1991    Quit date: 06/08/2011    Years since quitting: 12.3   Smokeless tobacco: Never  Substance Use Topics   Alcohol use: No     ALLERGIES:   Allergies  Allergen Reactions   Ciprofloxacin  Swelling and Other (See Comments)    Per pt caused lips swell and nauseous feeling   Levaquin [Levofloxacin] Swelling and Other (See Comments)    Per pt caused lips swell and nauseous feeling   Linaclotide Other (See Comments)    Cause severe dehydration   Promethazine  Other (See Comments)    Completely wipes out/fatigue   Buspar [Buspirone] Other (See Comments)    abd cramping   Advair Diskus [Fluticasone -Salmeterol] Other (See Comments)  Thrush    Biaxin [Clarithromycin] Rash   Hydroxyzine Palpitations     CURRENT MEDICATIONS:   Current Outpatient Medications  Medication Sig Dispense Refill   acetaminophen  (TYLENOL ) 500 MG tablet Take 500-1,000 mg by mouth every 6 (six) hours as needed (pain.).     aspirin  EC 81 MG tablet Take 1 tablet (81 mg total) by mouth daily with breakfast. Swallow whole. 30 tablet 12   azelastine  (ASTELIN ) 0.1 % nasal spray Place 2 sprays into both nostrils 2 (two) times daily. Use in each nostril as directed (Patient taking differently: Place 2 sprays into both nostrils in the morning. Use in each nostril as directed) 30 mL 2   buPROPion  (WELLBUTRIN  XL) 300 MG 24 hr tablet Take 1 tablet (300 mg total) by mouth daily. 30 tablet 5   carvedilol  (COREG ) 12.5 MG tablet Take 12.5 mg by mouth 2 (two) times daily with a meal.     Cholecalciferol  (VITAMIN D -3 PO) Take 1 tablet by mouth in the morning.      clopidogrel  (PLAVIX ) 75 MG tablet Take 1 tablet (75 mg total) by mouth daily. 30 tablet 5   Continuous Blood Gluc Sensor MISC 1 each by Does not apply route as directed. Use as directed every 14 days. May dispense FreeStyle [employer] or similar.     diclofenac  Sodium (VOLTAREN ) 1 % GEL Apply 2 g topically 4 (four) times daily as needed (pain.).     dicyclomine  (BENTYL ) 10 MG capsule Take 10 mg by mouth 2 (two) times daily as needed for spasms.     DULoxetine  (CYMBALTA ) 30 MG capsule Take 1 capsule (30 mg total) by mouth daily. 30 capsule 3   folic acid  (FOLVITE ) 800 MCG tablet Take 800 mcg by mouth in the morning.     glucagon (GLUCAGEN  HYPOKIT) 1 MG SOLR injection Inject 1 mg into the skin once as needed for up to 1 dose for low blood sugar. GlucaGen  HypoKit 1 mg Injection 1 each 3   hydrALAZINE  (APRESOLINE ) 25 MG tablet Take 25 mg by mouth 2 (two) times daily.     insulin  detemir (LEVEMIR ) 100 UNIT/ML injection Inject 27 Units into the skin 2 (two) times daily as needed (if insulin  pump inoperable).     [START ON 10/21/2023] lamoTRIgine  (LAMICTAL ) 200 MG tablet Take 1 tablet (200 mg total) by mouth every evening. 30 tablet 5   levothyroxine  (SYNTHROID ) 175 MCG tablet Take 1.5 tablets (mcg) by mouth on Sundays in the mornings & take 1 tablet (175 mcg) by mouth on all other days.     [START ON 10/12/2023] LORazepam  (ATIVAN ) 0.5 MG tablet Take 1 tablet (0.5 mg total) by mouth daily as needed for anxiety. 30 tablet 0   Melatonin 10 MG TABS Take 10 mg by mouth at bedtime.     NOVOLOG  100 UNIT/ML injection Inject into the skin continuous. Sliding Scale Insulin  pump 2.5 units basal Bolus with meal depending on the size     ondansetron  (ZOFRAN -ODT) 4 MG disintegrating tablet Take 4 mg by mouth every 8 (eight) hours as needed for nausea or vomiting.     OVER THE COUNTER MEDICATION Apply 1 application  topically at bedtime as needed (Sleep). CBD oil     OXYGEN Inhale 3 L into the lungs at  bedtime.     PRESCRIPTION MEDICATION See admin instructions.  IDPN LV CUSTOM (100GM)   INFUSE 1 BAG DURING HD 3 TIMES WEEKLY AS FOLLOWS 1ST WEEK 55ML/HR; 2ND WEEK 110ML/HR; 3RD  WEEK 163ML/HR; THEN CONTINUE WITH GOAL RATE= 163ML/HR OVER 3HRS . TITRATE AS TOLERATED. ADMINISTER IV VIA VENOUS DRIP CHAMBER.     rosuvastatin  (CRESTOR ) 10 MG tablet Take 1 tablet (10 mg total) by mouth daily. (Patient taking differently: Take 10 mg by mouth every evening.) 90 tablet 3   sevelamer  carbonate (RENVELA ) 800 MG tablet Take 1,600 mg by mouth 3 (three) times daily with meals.     torsemide  (DEMADEX ) 20 MG tablet TAKE TWO TABLETS BY MOUTH AS DIRECTED ON non dialysis DAYS     No current facility-administered medications for this visit.    REVIEW OF SYSTEMS:   [X]  denotes positive finding, [ ]  denotes negative finding Cardiac  Comments:  Chest pain or chest pressure:    Shortness of breath upon exertion:    Short of breath when lying flat:    Irregular heart rhythm:        Vascular    Pain in calf, thigh, or hip brought on by ambulation:    Pain in feet at night that wakes you up from your sleep:     Blood clot in your veins:    Leg swelling:         Pulmonary    Oxygen at home:    Productive cough:     Wheezing:         Neurologic    Sudden weakness in arms or legs:     Sudden numbness in arms or legs:     Sudden onset of difficulty speaking or slurred speech:    Temporary loss of vision in one eye:     Problems with dizziness:         Gastrointestinal    Blood in stool:     Vomited blood:         Genitourinary    Burning when urinating:     Blood in urine:        Psychiatric    Major depression:         Hematologic    Bleeding problems:    Problems with blood clotting too easily:        Skin    Rashes or ulcers:        Constitutional    Fever or chills:      PHYSICAL EXAM:   Vitals:   10/03/23 0916  BP: 138/74  Pulse: 65  Resp: 20  Temp: 98.3 F (36.8  C)  SpO2: 96%  Weight: 252 lb (114.3 kg)  Height: 5\' 9"  (1.753 m)    GENERAL: The patient is a well-nourished female, in no acute distress. The vital signs are documented above. CARDIAC: There is a regular rate and rhythm.  VASCULAR: Palpable radial pulse PULMONARY: Non-labored respirations ABDOMEN: Soft and non-tender with normal pitched bowel sounds.  MUSCULOSKELETAL: There are no major deformities or cyanosis. NEUROLOGIC: No focal weakness or paresthesias are detected. SKIN: There are no ulcers or rashes noted. PSYCHIATRIC: The patient has a normal affect.  STUDIES:   I have reviewed the following:  Left Cephalic    Diameter (cm)Depth (cm)Findings  +-----------------+-------------+----------+--------+  Shoulder            0.31                         +-----------------+-------------+----------+--------+  Prox upper arm       0.33                         +-----------------+-------------+----------+--------+  Mid upper arm        0.33                         +-----------------+-------------+----------+--------+  Dist upper arm       0.37                         +-----------------+-------------+----------+--------+  Antecubital fossa    0.42                         +-----------------+-------------+----------+--------+  Prox forearm         0.29                         +-----------------+-------------+----------+--------+  Mid forearm          0.25                         +-----------------+-------------+----------+--------+  Dist forearm         0.18                         +-----------------+-------------+----------+--------+   +-----------------+-------------+----------+--------+  Left Basilic     Diameter (cm)Depth (cm)Findings  +-----------------+-------------+----------+--------+  Mid upper arm        0.51                         +-----------------+-------------+----------+--------+  Dist upper arm       0.43                          +-----------------+-------------+----------+--------+  Antecubital fossa    0.24                         +-----------------+-------------+----------+--------+   MEDICAL ISSUES:   End-stage renal disease: The patient is currently dialyzing through a catheter.  She recently underwent cardiac catheterization and PCI and is on dual antiplatelet therapy.  For that reason I am uncomfortable placing a peritoneal catheter while on dual antiplatelet therapy.  In addition, she is unsure whether or not she is going to qualify for peritoneal dialysis because of insurance issues.  Therefore think the best course of action is to proceed with a left arm fistula.  I discussed that I would initially attempt a left brachiocephalic fistula assuming her vein is adequate.  If the vein is not adequate I would use her basilic vein understanding that this would be a two-stage procedure.  I also stated that she may require elevation of a brachiocephalic fistula.  I would not stop her antiplatelet therapy and we will schedule her fistula in the near future.    Marti Slates, MD, FACS [facility] [phone] Pager [phone]

## 2023-10-04 ENCOUNTER — Telehealth: Payer: Self-pay

## 2023-10-04 ENCOUNTER — Ambulatory Visit (HOSPITAL_COMMUNITY)
Admission: RE | Admit: 2023-10-04 | Discharge: 2023-10-04 | Disposition: A | Discharge status: Home / Self Care | Source: Ambulatory Visit | Attending: Emergency Medicine | Admitting: Emergency Medicine

## 2023-10-04 DIAGNOSIS — M79601 Pain in right arm: Secondary | ICD-10-CM | POA: Insufficient documentation

## 2023-10-04 DIAGNOSIS — M7989 Other specified soft tissue disorders: Secondary | ICD-10-CM | POA: Insufficient documentation

## 2023-10-04 NOTE — Telephone Encounter (Signed)
 Copied from CRM [id number]. Topic: Clinical - Medical Advice >> Oct 03, 2023 12:18 PM Chantha C wrote: Reason for CRM: Patient [phone] states [employer] insurance will not cover cpap machine 9/24-12/24 because patient does not seem to have improvements. Patient wants to speak with nurse. Please advise and call back.  Spoke with patient regarding prior message . Patient stated she is going to appeal this with her insurance . Advised patient I looked at her compliance and she has been using her CPAP.   Patient's voice was understanding.Nothing else further needed.

## 2023-10-07 ENCOUNTER — Other Ambulatory Visit: Payer: Self-pay

## 2023-10-07 ENCOUNTER — Encounter (HOSPITAL_COMMUNITY)
Admission: RE | Disposition: A | Discharge status: Home / Self Care | Payer: Self-pay | Source: Home / Self Care | Attending: Nephrology

## 2023-10-07 ENCOUNTER — Ambulatory Visit (HOSPITAL_COMMUNITY)
Admission: RE | Admit: 2023-10-07 | Discharge: 2023-10-07 | Disposition: A | Discharge status: Home / Self Care | Attending: Nephrology | Admitting: Nephrology

## 2023-10-07 DIAGNOSIS — T8249XA Other complication of vascular dialysis catheter, initial encounter: Secondary | ICD-10-CM | POA: Insufficient documentation

## 2023-10-07 DIAGNOSIS — D631 Anemia in chronic kidney disease: Secondary | ICD-10-CM | POA: Diagnosis not present

## 2023-10-07 DIAGNOSIS — E1022 Type 1 diabetes mellitus with diabetic chronic kidney disease: Secondary | ICD-10-CM | POA: Diagnosis not present

## 2023-10-07 DIAGNOSIS — Y839 Surgical procedure, unspecified as the cause of abnormal reaction of the patient, or of later complication, without mention of misadventure at the time of the procedure: Secondary | ICD-10-CM | POA: Diagnosis not present

## 2023-10-07 DIAGNOSIS — N250 Renal osteodystrophy: Secondary | ICD-10-CM | POA: Insufficient documentation

## 2023-10-07 DIAGNOSIS — E785 Hyperlipidemia, unspecified: Secondary | ICD-10-CM | POA: Insufficient documentation

## 2023-10-07 DIAGNOSIS — E039 Hypothyroidism, unspecified: Secondary | ICD-10-CM | POA: Insufficient documentation

## 2023-10-07 DIAGNOSIS — N186 End stage renal disease: Secondary | ICD-10-CM | POA: Diagnosis not present

## 2023-10-07 DIAGNOSIS — Z794 Long term (current) use of insulin: Secondary | ICD-10-CM | POA: Diagnosis not present

## 2023-10-07 DIAGNOSIS — I120 Hypertensive chronic kidney disease with stage 5 chronic kidney disease or end stage renal disease: Secondary | ICD-10-CM | POA: Insufficient documentation

## 2023-10-07 DIAGNOSIS — Z79899 Other long term (current) drug therapy: Secondary | ICD-10-CM | POA: Insufficient documentation

## 2023-10-07 DIAGNOSIS — Z992 Dependence on renal dialysis: Secondary | ICD-10-CM | POA: Insufficient documentation

## 2023-10-07 DIAGNOSIS — Z87891 Personal history of nicotine dependence: Secondary | ICD-10-CM | POA: Insufficient documentation

## 2023-10-07 DIAGNOSIS — G4733 Obstructive sleep apnea (adult) (pediatric): Secondary | ICD-10-CM | POA: Insufficient documentation

## 2023-10-07 HISTORY — PX: TUNNELLED CATHETER EXCHANGE: CATH118373

## 2023-10-07 SURGERY — TUNNELLED CATHETER EXCHANGE
Anesthesia: LOCAL

## 2023-10-07 MED ORDER — MIDAZOLAM HCL 2 MG/2ML IJ SOLN
INTRAMUSCULAR | Status: AC
  Filled 2023-10-07: qty 2

## 2023-10-07 MED ORDER — MIDAZOLAM HCL 2 MG/2ML IJ SOLN
INTRAMUSCULAR | Status: DC | PRN
  Administered 2023-10-07: 1 mg via INTRAVENOUS

## 2023-10-07 MED ORDER — LIDOCAINE HCL (PF) 1 % IJ SOLN
INTRAMUSCULAR | Status: AC
Start: 2023-10-07 — End: ?
  Filled 2023-10-07: qty 30

## 2023-10-07 MED ORDER — FENTANYL CITRATE (PF) 100 MCG/2ML IJ SOLN
INTRAMUSCULAR | Status: AC
  Filled 2023-10-07: qty 2

## 2023-10-07 MED ORDER — SODIUM CHLORIDE 0.9 % IV SOLN: INTRAVENOUS | Status: DC

## 2023-10-07 MED ORDER — LIDOCAINE HCL (PF) 1 % IJ SOLN
INTRAMUSCULAR | Status: DC | PRN
  Administered 2023-10-07: 5 mL

## 2023-10-07 MED ORDER — FENTANYL CITRATE (PF) 100 MCG/2ML IJ SOLN
INTRAMUSCULAR | Status: DC | PRN
  Administered 2023-10-07: 25 ug via INTRAVENOUS

## 2023-10-07 MED ORDER — HEPARIN (PORCINE) IN NACL 1000-0.9 UT/500ML-% IV SOLN
INTRAVENOUS | Status: DC | PRN
  Administered 2023-10-07: 500 mL

## 2023-10-07 MED ORDER — HEPARIN SODIUM (PORCINE) 1000 UNIT/ML IJ SOLN
INTRAMUSCULAR | Status: DC | PRN
  Administered 2023-10-07: 3.8 [IU] via INTRAVENOUS

## 2023-10-07 MED ORDER — IODIXANOL 320 MG/ML IV SOLN
INTRAVENOUS | Status: DC | PRN
  Administered 2023-10-07: 2 mL

## 2023-10-07 SURGICAL SUPPLY — 2 items
CATH PALINDROME-P 23 W/VT (CATHETERS) IMPLANT
GLIDEWIRE STIFF .35X180X3 HYDR (WIRE) IMPLANT

## 2023-10-07 NOTE — Op Note (Signed)
 Patient presents with poor flows in her left  IJ tunneled hemodialysis catheter (19 cm cuff to tip- Palindrome) that was placed here 1 week ago. On examination, aspiration and flushing from both ports is sluggish. Chest x-ray confirms the catheter tip is retracted up into the SVC. The catheter cuff is 1/2cm deep to the exit site.    Summary:  1) The patient had a successful 23 cm CTT Palindrome hemodialysis catheter exchange in the left internal jugular vein (to replace the 19 cm catheter). 2) No sheath noted through either port. 3) Okay to use catheter immediately.  Description of procedure: The left neck, chest and the catheter were prepped and draped in the usual sterile fashion. The exit site and adjacent tunnel tract were anesthetized with lidocaine  1% with epinephrine . The cuff was dissected free with a curved Kelly and manual traction. The catheter was withdrawn and venogram through each of the ports was performed; there was no fibrin sheath evident through either port.   A hydrophilic wire was manipulated and advanced through the arterial port and the tip was parked in the IVC. The catheter was completely removed and noted to be entirely intact. A new 23 cm cuff to tip Palindrome catheter was inserted over the guidewire and the tip parked in the right atrium and at that point the cuff was approximately 1 cm deep to the exit site.   Aspiration and flushing of both limbs of the catheter confirmed excellent flow. No kinks were visible on fluoroscopic imaging. Both limbs of the catheter were locked with citrate and sterile caps were placed.   The hub was secured on to the chest wall with 2-0 nylon wing sutures.  Sterile dressings were placed, and the patient returned to recovery in stable condition.  Sedation: 1 mg Versed , 25 mcg Fentanyl  Sedation time: 19 minutes  Contrast: 2 mL  Monitoring: Because of the patient's comorbid conditions and sedation during the procedure, continuous EKG  monitoring and O2 saturation monitoring was performed throughout the procedure by the RN. There were no abnormal arrhythmias encountered.  Complications: None.  Diagnoses:   T82.49XA Other complication of vascular dialysis catheter (Poor flows) N18.6 End stage renal disease  Z99.2 Dialysis dependence  Procedures Coding:  36581 Tunneled catheter exchange 77001  Fluoroscopy guidance for catheter exchange. Z6109 Contrast  Recommendations: Remove the suture in 3 weeks. 2.   Report any blood flow problems to [facility].  Discharge: The patient was discharged home in stable condition. The patient was given education regarding the care of the catheter and specific instructions in case of any problems.

## 2023-10-07 NOTE — Interval H&P Note (Signed)
 History and Physical Interval Note:  10/07/2023 10:13 AM  Jasmine [name]  has presented today for surgery, with the diagnosis of poor function.  The various methods of treatment have been discussed with the patient and family. After consideration of risks, benefits and other options for treatment, the patient has consented to  Procedure(s): TUNNELLED CATHETER EXCHANGE (N/A) as a surgical intervention.  The patient's history has been reviewed, patient examined, no change in status, stable for surgery.  I have reviewed the patient's chart and labs.  Questions were answered to the patient's satisfaction.     Melodie Spry

## 2023-10-08 ENCOUNTER — Encounter (HOSPITAL_COMMUNITY): Payer: Self-pay | Admitting: Nephrology

## 2023-10-11 ENCOUNTER — Other Ambulatory Visit: Payer: Self-pay

## 2023-10-11 ENCOUNTER — Encounter (HOSPITAL_COMMUNITY): Payer: Self-pay | Admitting: Surgery

## 2023-10-11 ENCOUNTER — Ambulatory Visit: Payer: Self-pay | Admitting: Infectious Diseases

## 2023-10-11 ENCOUNTER — Other Ambulatory Visit: Payer: Self-pay | Admitting: Primary Care

## 2023-10-11 ENCOUNTER — Other Ambulatory Visit: Payer: Self-pay | Admitting: Psychiatry

## 2023-10-11 NOTE — Progress Notes (Addendum)
 PCP - Lory Rough., PA-C  Cardiologist - Thukkani, Arun K, MD   PPM/ICD - denies Device Orders - n/a Rep Notified - n/a  Chest x-ray - 09-10-23 EKG - 09-05-23 Stress Test -  ECHO - 09-07-23 Cardiac Cath - 08-30-23  CPAP - per patient uses at night Home sleep test 11-09-19  GLP-1 - denies  Fasting Blood Sugar - Dexacom right arm per patient. Per patient blood sugars range around 100. Checks Blood Sugar dexacom right arm. Per patient Tandem Mobi insulin  pump to abdomen, per patient she reduces her basal rate between 25-50% depending on blood sugar prior to procedures  Blood Thinner Instructions: Plavix  continue per instructions per Dr. Charlotte Cookey  Aspirin  Instructions: continue per patient  ERAS Protcol - NPO  COVID TEST- n/a  Anesthesia review: yes, Hx of ERSD, HTN, DM type one, OSA. Recent ED visit for right arm pain and swelling per patient U/S done negative.  Patient had a procedure on 10-07-23 due to poor flow to internal jugular tunneled dialysis catheter. Patient also recently received prescription for prednisone for back inflammation she has not started medication due to increasing of blood sugar levels   Patient verbally denies any shortness of breath, fever, cough and chest pain during phone call   -------------  SDW INSTRUCTIONS given:  Your procedure is scheduled on Oct 14, 2023.  Report to [facility] Main Entrance "A" at 5:30 A.M., and check in at the Admitting office.  Call this number if you have problems the morning of surgery:  [phone]   Remember:  Do not eat or drink after midnight the night before your surgery      Take these medicines the morning of surgery with A SIP OF WATER   acetaminophen  (TYLENOL )  azelastine  (ASTELIN )  buPROPion  (WELLBUTRIN  XL)  carvedilol  (COREG )  dicyclomine  (BENTYL )  DULoxetine  (CYMBALTA )  levothyroxine  (SYNTHROID )  LORazepam  (ATIVAN )  predniSONE (DELTASONE)     As of today, STOP taking any Aspirin  (unless otherwise  instructed by your surgeon) Aleve, Naproxen, Ibuprofen, Motrin, Advil, Goody's, BC's, all herbal medications, fish oil, and all vitamins.          WHAT DO I DO ABOUT MY DIABETES MEDICATION?   Do not take oral diabetes medicines (pills) the morning of surgery.  Patient has a Tandem Mobi  insulin  pump to abdomen and she reduces her rate by 25-50% depending on blood sugars prior to procedure  The day of surgery, do not take other diabetes injectables, including Byetta (exenatide), Bydureon (exenatide ER), Victoza (liraglutide), or Trulicity (dulaglutide).  If your CBG is greater than 220 mg/dL, you may take  of your sliding scale (correction) dose of insulin .   HOW TO MANAGE YOUR DIABETES BEFORE AND AFTER SURGERY  Why is it important to control my blood sugar before and after surgery? Improving blood sugar levels before and after surgery helps healing and can limit problems. A way of improving blood sugar control is eating a healthy diet by:  Eating less sugar and carbohydrates  Increasing activity/exercise  Talking with your doctor about reaching your blood sugar goals High blood sugars (greater than 180 mg/dL) can raise your risk of infections and slow your recovery, so you will need to focus on controlling your diabetes during the weeks before surgery. Make sure that the doctor who takes care of your diabetes knows about your planned surgery including the date and location.  How do I manage my blood sugar before surgery? Check your blood sugar at least 4 times a  day, starting 2 days before surgery, to make sure that the level is not too high or low.  Check your blood sugar the morning of your surgery when you wake up and every 2 hours until you get to the Short Stay unit.  If your blood sugar is less than 70 mg/dL, you will need to treat for low blood sugar: Do not take insulin . Treat a low blood sugar (less than 70 mg/dL) with  cup of clear juice (cranberry or apple), 4 glucose  tablets, OR glucose gel. Recheck blood sugar in 15 minutes after treatment (to make sure it is greater than 70 mg/dL). If your blood sugar is not greater than 70 mg/dL on recheck, call [phone] for further instructions. Report your blood sugar to the short stay nurse when you get to Short Stay.  If you are admitted to the hospital after surgery: Your blood sugar will be checked by the staff and you will probably be given insulin  after surgery (instead of oral diabetes medicines) to make sure you have good blood sugar levels. The goal for blood sugar control after surgery is 80-180 mg/dL.             Do not wear jewelry, make up, or nail polish            Do not wear lotions, powders, perfumes/colognes, or deodorant.            Do not shave 48 hours prior to surgery.  Men may shave face and neck.            Do not bring valuables to the hospital.            [facility] is not responsible for any belongings or valuables.  Do NOT Smoke (Tobacco/Vaping) 24 hours prior to your procedure If you use a CPAP at night, you may bring all equipment for your overnight stay.   Contacts, glasses, dentures or bridgework may not be worn into surgery.      For patients admitted to the hospital, discharge time will be determined by your treatment team.   Patients discharged the day of surgery will not be allowed to drive home, and someone needs to stay with them for 24 hours.    Special instructions:   [facility]- Preparing For Surgery  Before surgery, you can play an important role. Because skin is not sterile, your skin needs to be as free of germs as possible. You can reduce the number of germs on your skin by washing with CHG (chlorahexidine gluconate) Soap before surgery.  CHG is an antiseptic cleaner which kills germs and bonds with the skin to continue killing germs even after washing.    Oral Hygiene is also important to reduce your risk of infection.  Remember - BRUSH YOUR TEETH THE MORNING  OF SURGERY WITH YOUR REGULAR TOOTHPASTE  Please do not use if you have an allergy to CHG or antibacterial soaps. If your skin becomes reddened/irritated stop using the CHG.  Do not shave (including legs and underarms) for at least 48 hours prior to first CHG shower. It is OK to shave your face.  Please follow these instructions carefully.   Shower the NIGHT BEFORE SURGERY and the MORNING OF SURGERY with DIAL Soap.   Pat yourself dry with a CLEAN TOWEL.  Wear CLEAN PAJAMAS to bed the night before surgery  Place CLEAN SHEETS on your bed the night of your first shower and DO NOT SLEEP WITH PETS.  Day of Surgery: Please shower morning of surgery  Wear Clean/Comfortable clothing the morning of surgery Do not apply any deodorants/lotions.   Remember to brush your teeth WITH YOUR REGULAR TOOTHPASTE.   Questions were answered. Patient verbalized understanding of instructions.

## 2023-10-12 ENCOUNTER — Encounter (HOSPITAL_COMMUNITY): Payer: Self-pay | Admitting: Surgery

## 2023-10-12 NOTE — Anesthesia Preprocedure Evaluation (Addendum)
 Anesthesia Evaluation  Patient identified by MRN, date of birth, ID band Patient awake    Reviewed: Allergy & Precautions, H&P , NPO status , Patient's Chart, lab work & pertinent test results  Airway Mallampati: II   Neck ROM: full    Dental   Pulmonary asthma , sleep apnea , former smoker   breath sounds clear to auscultation       Cardiovascular hypertension, + CAD and + Cardiac Stents   Rhythm:regular Rate:Normal     Neuro/Psych  Headaches PSYCHIATRIC DISORDERS Anxiety Depression Bipolar Disorder    Neuromuscular disease    GI/Hepatic ,GERD  ,,  Endo/Other  diabetesHypothyroidism    Renal/GU ESRF and DialysisRenal disease     Musculoskeletal  (+) Arthritis ,  Fibromyalgia -  Abdominal   Peds  Hematology   Anesthesia Other Findings   Reproductive/Obstetrics                             Anesthesia Physical Anesthesia Plan  ASA: 3  Anesthesia Plan: General   Post-op Pain Management:    Induction: Intravenous  PONV Risk Score and Plan: 3 and Ondansetron , Dexamethasone and Treatment may vary due to age or medical condition  Airway Management Planned: LMA  Additional Equipment:   Intra-op Plan:   Post-operative Plan: Extubation in OR  Informed Consent: I have reviewed the patients History and Physical, chart, labs and discussed the procedure including the risks, benefits and alternatives for the proposed anesthesia with the patient or authorized representative who has indicated his/her understanding and acceptance.     Dental advisory given  Plan Discussed with: CRNA, Anesthesiologist and Surgeon  Anesthesia Plan Comments: (PAT note written 10/12/2023 by Allison Zelenak, PA-C. DES x3 in March. Continuing DAPT. )       Anesthesia Quick Evaluation

## 2023-10-12 NOTE — Progress Notes (Signed)
 Anesthesia Chart Review: SAME DAY WORK-UP   Case: [id number] Date/Time: 10/14/23 0715   Procedure: ARTERIOVENOUS (AV) FISTULA CREATION (Left)   Anesthesia type: Choice   Diagnosis: ESRD (end stage renal disease) (HCC) [N18.6]   Pre-op  diagnosis: ESRD   [facility] / [facility]   Surgeons: Margherita Shell, MD       DISCUSSION: Patient is a 53 year old female scheduled for the above procedure. She underwent exchange of left internal jugular TDC on 10/07/23 due to poor flows with hemodialysis.    History includes former smoker (quit 06/08/11), HTN, tachycardia, CAD (DES to LAD, D1, RCA 08/30/23), DM1, ESRD (HD initiated 01/13/23; TTS, [facility]), hypercholesterolemia, OSA (CPAP, uses O2 at night), hypothyroidism, GERD, Bipolar disorder, tremor, memory changes, IBS, anemia.    She was started on HD in August 2024 during admission for acute hypoxic respiratory failure and DKA. She was interested in PD catheter, but subsequently had coronary stents placed on March 2025 and was started on DAPT. Continuing DAPT for at least six months had been recommended at time of stent placement, and vascular surgeon did not think PD catheter should be placed while still on DAPT, so decision made to proceed with AVF creation while continuing DAPT perioperatively.  She is currently using a left internal jugular TDC for HD, but had been having flow issues and underwent catheter exchange on 10/07/23.  She also had recent admission 09/05/23 -09/11/23 for MRSA bacteremia and sepsis in setting of right thigh abscess and TDC. S/p Rocephin  and doxycycline , initially. TEE showed no vegetations. TDC removed briefly for line holiday and was discharged on vancomycin  with dialysis per ID.   She wears a Dexcom CGM and has a Tandem Mobi insulin  pump. She reported instructions to reduce basal rate 25-50% depending on glucose trends prior to procedures. Reported recent prescription of prednisone for back "inflammation" but has not started  yet due to concern for worsening hyperglycemia. She reported fasting glucose readings ~ 100 recently.  She had recent DES x3 on 08/30/23. She is to continue Plavix  and ASA perioperatively.   Anesthesia team to evaluate on the day of surgery.   VS: Ht 5\' 9"  (1.753 m)   Wt 114 kg   LMP 03/23/2017 (Approximate)   BMI 37.11 kg/m  BP Readings from Last 3 Encounters:  10/07/23 (!) 143/73  10/03/23 138/74  09/30/23 (!) 141/74   Pulse Readings from Last 3 Encounters:  10/07/23 72  10/03/23 65  09/30/23 68     PROVIDERS: Lory Rough., PA-C is PCP Alyssa Backbone, MD is cardiologist Celene Coins, MD is pulmonologist  LABS: For day of procedure. As of 09/10/23, glucose 352, CBC normal. A1c 5.6% on 03/19/23.    IMAGES: 1V PCXR 09/10/23: MPRESSION: 1. Left-sided dialysis catheter tip overlies the lower SVC. No pneumothorax. 2. Low lung volumes with scattered atelectasis. Improving pulmonary edema.     EKG: 09/05/23: Sinus rhythm Low voltage, precordial leads Probable anteroseptal infarct, old rate is faster, otherwise no significant change since Mar 2025 Confirmed by Jerilynn Montenegro [phone]) on 09/05/2023 10:42:04 AM   CV: TEE 09/08/23: IMPRESSIONS   1. Left ventricular ejection fraction, by estimation, is 60 to 65%. The  left ventricle has normal function.   2. Right ventricular systolic function is normal. The right ventricular  size is normal.   3. Left atrial size was mildly dilated. No left atrial/left atrial  appendage thrombus was detected. The LAA emptying velocity was 60 cm/s.   4.  The mitral valve is normal in structure. Trivial mitral valve  regurgitation. No evidence of mitral stenosis.   5. The aortic valve is tricuspid. Aortic valve regurgitation is not  visualized. No aortic stenosis is present.   Conclusion(s)/Recommendation(s): No evidence of vegetation/infective  endocarditis on this transesophageael echocardiogram.   TTE 09/07/23: IMPRESSIONS   1.  Left ventricular ejection fraction, by estimation, is 55 to 60%. The  left ventricle has normal function. The left ventricle has no regional  wall motion abnormalities. Left ventricular diastolic parameters were  normal.   2. Right ventricular systolic function is normal. The right ventricular  size is normal.   3. The mitral valve is normal in structure. Trivial mitral valve  regurgitation. No evidence of mitral stenosis.   4. The tricuspid valve is abnormal.   5. The aortic valve has an indeterminant number of cusps. Aortic valve  regurgitation is not visualized. No aortic stenosis is present.   6. The inferior vena cava is normal in size with greater than 50%  respiratory variability, suggesting right atrial pressure of 3 mmHg.    Cardiac cath 08/30/23:   Mid LAD to Dist LAD lesion is 80% stenosed.   Prox RCA to Mid RCA lesion is 70% stenosed.   1st Diag lesion is 80% stenosed.   A stent was successfully placed.   A stent was successfully placed.   A stent was successfully placed.   Post intervention, there is a 0% residual stenosis.   Post intervention, there is a 0% residual stenosis.   Post intervention, there is a 0% residual stenosis.   1.  Low syntax score multivessel disease treated involving the first diagonal, mid to distal LAD, and mid right coronary artery. 2.  DES x 1 first diagonal. 3.  DES x 2 mid to distal LAD with OCT imaging optimization. 4.  DES x 1 mid RCA. 5.  LVEDP of 33 mmHg.   Recommendation: Same-day discharge after PCI with 6 hours bedrest.  Dual antiplatelet therapy for preferably 6 months and then Plavix  monotherapy indefinitely.    US  Carotid 05/13/22: Summary:  Right Carotid: The extracranial vessels were near-normal with only minimal wall thickening or plaque.  Left Carotid: The extracranial vessels were near-normal with only minimal wall thickening or plaque.  Vertebrals:  Bilateral vertebral arteries demonstrate antegrade flow.  Subclavians:  Normal flow hemodynamics were seen in bilateral subclavian arteries.    Past Medical History:  Diagnosis Date   Anemia    Anxiety    Arthritis    Asthma    Balance problems    Bipolar disorder (HCC)    Charcot ankle    Chronic fatigue    Coronary artery disease    Depression    Diabetes mellitus    DKA, type 1 (HCC) 11/04/2011   Dysrhythmia    Elevated cholesterol    ESRD on hemodialysis (HCC)    TTS at [facility]   Fibromyalgia    GERD (gastroesophageal reflux disease)    Headache    History of suicidal ideation    Hyperlipemia    Hypertension    Hypothyroidism    IBS (irritable bowel syndrome)    Memory changes    Obesity    Sleep apnea    HAS C -PAP / DOES NOT USE   Stress incontinence    Pt had surgery to correct this.   Tachycardia    Tobacco abuse    Tremor    UTI (lower urinary tract infection)  Past Surgical History:  Procedure Laterality Date   CORONARY IMAGING/OCT N/A 08/30/2023   Procedure: CORONARY IMAGING/OCT;  Surgeon: Kyra Phy, MD;  [facility]: [facility] CV LAB;  Service: Cardiovascular;  Laterality: N/A;   CORONARY STENT INTERVENTION N/A 08/30/2023   Procedure: CORONARY STENT INTERVENTION;  Surgeon: Kyra Phy, MD;  [facility]: [facility] CV LAB;  Service: Cardiovascular;  Laterality: N/A;   INCONTINENCE SURGERY     IR FLUORO GUIDE CV LINE LEFT  09/10/2023   IR US  GUIDE VASC ACCESS LEFT  09/10/2023   LEFT HEART CATH AND CORONARY ANGIOGRAPHY N/A 07/15/2023   Procedure: LEFT HEART CATH AND CORONARY ANGIOGRAPHY;  Surgeon: Kyra Phy, MD;  [facility];  Service: Cardiovascular;  Laterality: N/A;   LEFT HEART CATH AND CORONARY ANGIOGRAPHY N/A 08/30/2023   Procedure: LEFT HEART CATH AND CORONARY ANGIOGRAPHY;  Surgeon: Kyra Phy, MD;  [facility] CV LAB;  Service: Cardiovascular;  Laterality: N/A;   NASAL FRACTURE SURGERY     ovary removed     OVARY SURGERY     PUBOVAGINAL SLING  08/16/2011   Procedure:  Gino Lais;  Surgeon: Livingston Rigg, MD;  Location: [facility] ORS;  Service: Urology;  Laterality: N/A;          TEE WITHOUT CARDIOVERSION N/A 09/08/2023   Procedure: ECHOCARDIOGRAM, TRANSESOPHAGEAL;  Surgeon: Laurann Pollock, MD;  Location: [facility] ORS;  Service: Endoscopy;  Laterality: N/A;   TUNNELLED CATHETER EXCHANGE N/A 09/30/2023   Procedure: TUNNELLED CATHETER EXCHANGE;  Surgeon: Patrick Boor, MD;  Location: [facility] INVASIVE CV LAB;  Service: Cardiovascular;  Laterality: N/A;   TUNNELLED CATHETER EXCHANGE N/A 10/07/2023   Procedure: TUNNELLED CATHETER EXCHANGE;  Surgeon: Melodie Spry, MD;  Location: [facility] INVASIVE CV LAB;  Service: Cardiovascular;  Laterality: N/A;   UTERINE FIBROID SURGERY  2001    MEDICATIONS: No current facility-administered medications for this encounter.    acetaminophen  (TYLENOL ) 500 MG tablet   aspirin  EC 81 MG tablet   azelastine  (ASTELIN ) 0.1 % nasal spray   buPROPion  (WELLBUTRIN  XL) 300 MG 24 hr tablet   carvedilol  (COREG ) 12.5 MG tablet   Cholecalciferol  (VITAMIN D -3 PO)   clopidogrel  (PLAVIX ) 75 MG tablet   Continuous Blood Gluc Sensor MISC   diclofenac  Sodium (VOLTAREN ) 1 % GEL   dicyclomine  (BENTYL ) 10 MG capsule   folic acid  (FOLVITE ) 800 MCG tablet   glucagon (GLUCAGEN  HYPOKIT) 1 MG SOLR injection   hydrALAZINE  (APRESOLINE ) 25 MG tablet   insulin  detemir (LEVEMIR ) 100 UNIT/ML injection   [START ON 10/21/2023] lamoTRIgine  (LAMICTAL ) 200 MG tablet   levothyroxine  (SYNTHROID ) 175 MCG tablet   LORazepam  (ATIVAN ) 0.5 MG tablet   Melatonin 10 MG TABS   NOVOLOG  100 UNIT/ML injection   ondansetron  (ZOFRAN -ODT) 4 MG disintegrating tablet   OVER THE COUNTER MEDICATION   OXYGEN   predniSONE (DELTASONE) 10 MG tablet   PRESCRIPTION MEDICATION   rosuvastatin  (CRESTOR ) 10 MG tablet   sevelamer  carbonate (RENVELA ) 800 MG tablet   torsemide  (DEMADEX ) 20 MG tablet   DULoxetine  (CYMBALTA ) 30 MG capsule    Ella Gun, PA-C Surgical Short  Stay/Anesthesiology [facility] Phone [phone] [facility] Phone [phone] 10/12/2023 11:15 AM

## 2023-10-13 NOTE — Progress Notes (Signed)
 Patient was called to be informed that the surgery time for tomorrow was changed to 10:09 o'clock. Patient was instructed to be at the hospital at 07:30 o'clock. Patient verbalized understanding.

## 2023-10-14 ENCOUNTER — Ambulatory Visit (HOSPITAL_COMMUNITY): Payer: Self-pay | Admitting: Vascular Surgery

## 2023-10-14 ENCOUNTER — Encounter (HOSPITAL_COMMUNITY): Payer: Self-pay | Admitting: Surgery

## 2023-10-14 ENCOUNTER — Other Ambulatory Visit: Payer: Self-pay

## 2023-10-14 ENCOUNTER — Encounter (HOSPITAL_COMMUNITY)
Admission: RE | Disposition: A | Discharge status: Home / Self Care | Payer: Self-pay | Source: Home / Self Care | Attending: Surgery

## 2023-10-14 ENCOUNTER — Other Ambulatory Visit (HOSPITAL_COMMUNITY): Payer: Self-pay

## 2023-10-14 ENCOUNTER — Ambulatory Visit (HOSPITAL_COMMUNITY)
Admission: RE | Admit: 2023-10-14 | Discharge: 2023-10-14 | Disposition: A | Discharge status: Home / Self Care | Attending: Surgery | Admitting: Surgery

## 2023-10-14 DIAGNOSIS — M797 Fibromyalgia: Secondary | ICD-10-CM | POA: Diagnosis not present

## 2023-10-14 DIAGNOSIS — Z794 Long term (current) use of insulin: Secondary | ICD-10-CM | POA: Diagnosis not present

## 2023-10-14 DIAGNOSIS — Z79899 Other long term (current) drug therapy: Secondary | ICD-10-CM | POA: Diagnosis not present

## 2023-10-14 DIAGNOSIS — M1990 Unspecified osteoarthritis, unspecified site: Secondary | ICD-10-CM | POA: Insufficient documentation

## 2023-10-14 DIAGNOSIS — F319 Bipolar disorder, unspecified: Secondary | ICD-10-CM | POA: Insufficient documentation

## 2023-10-14 DIAGNOSIS — Z955 Presence of coronary angioplasty implant and graft: Secondary | ICD-10-CM | POA: Insufficient documentation

## 2023-10-14 DIAGNOSIS — E039 Hypothyroidism, unspecified: Secondary | ICD-10-CM | POA: Insufficient documentation

## 2023-10-14 DIAGNOSIS — I132 Hypertensive heart and chronic kidney disease with heart failure and with stage 5 chronic kidney disease, or end stage renal disease: Secondary | ICD-10-CM | POA: Insufficient documentation

## 2023-10-14 DIAGNOSIS — I5030 Unspecified diastolic (congestive) heart failure: Secondary | ICD-10-CM | POA: Insufficient documentation

## 2023-10-14 DIAGNOSIS — Z992 Dependence on renal dialysis: Secondary | ICD-10-CM | POA: Insufficient documentation

## 2023-10-14 DIAGNOSIS — E1022 Type 1 diabetes mellitus with diabetic chronic kidney disease: Secondary | ICD-10-CM | POA: Diagnosis not present

## 2023-10-14 DIAGNOSIS — G4730 Sleep apnea, unspecified: Secondary | ICD-10-CM | POA: Insufficient documentation

## 2023-10-14 DIAGNOSIS — F419 Anxiety disorder, unspecified: Secondary | ICD-10-CM | POA: Diagnosis not present

## 2023-10-14 DIAGNOSIS — Z87891 Personal history of nicotine dependence: Secondary | ICD-10-CM | POA: Diagnosis not present

## 2023-10-14 DIAGNOSIS — Z7982 Long term (current) use of aspirin: Secondary | ICD-10-CM | POA: Insufficient documentation

## 2023-10-14 DIAGNOSIS — Z7902 Long term (current) use of antithrombotics/antiplatelets: Secondary | ICD-10-CM | POA: Insufficient documentation

## 2023-10-14 DIAGNOSIS — K219 Gastro-esophageal reflux disease without esophagitis: Secondary | ICD-10-CM | POA: Diagnosis not present

## 2023-10-14 DIAGNOSIS — J45909 Unspecified asthma, uncomplicated: Secondary | ICD-10-CM | POA: Diagnosis not present

## 2023-10-14 DIAGNOSIS — N185 Chronic kidney disease, stage 5: Secondary | ICD-10-CM | POA: Diagnosis not present

## 2023-10-14 DIAGNOSIS — N186 End stage renal disease: Secondary | ICD-10-CM | POA: Diagnosis present

## 2023-10-14 DIAGNOSIS — I2510 Atherosclerotic heart disease of native coronary artery without angina pectoris: Secondary | ICD-10-CM | POA: Diagnosis not present

## 2023-10-14 HISTORY — DX: Anemia, unspecified: D64.9

## 2023-10-14 HISTORY — DX: Atherosclerotic heart disease of native coronary artery without angina pectoris: I25.10

## 2023-10-14 HISTORY — PX: AV FISTULA PLACEMENT: SHX1204

## 2023-10-14 HISTORY — DX: Cardiac arrhythmia, unspecified: I49.9

## 2023-10-14 LAB — SURGICAL PCR SCREEN
MRSA, PCR: NEGATIVE
Staphylococcus aureus: NEGATIVE

## 2023-10-14 LAB — GLUCOSE, CAPILLARY
Glucose-Capillary: 86 mg/dL (ref 70–99)
Glucose-Capillary: 79 mg/dL (ref 70–99)
Glucose-Capillary: 146 mg/dL — ABNORMAL HIGH (ref 70–99)

## 2023-10-14 LAB — POCT I-STAT, CHEM 8
BUN: 26 mg/dL — ABNORMAL HIGH (ref 6–20)
Calcium, Ion: 1.25 mmol/L (ref 1.15–1.40)
Chloride: 98 mmol/L (ref 98–111)
Creatinine, Ser: 3.3 mg/dL — ABNORMAL HIGH (ref 0.44–1.00)
Glucose, Bld: 59 mg/dL — ABNORMAL LOW (ref 70–99)
HCT: 29 % — ABNORMAL LOW (ref 36.0–46.0)
Hemoglobin: 9.9 g/dL — ABNORMAL LOW (ref 12.0–15.0)
Potassium: 3.5 mmol/L (ref 3.5–5.1)
Sodium: 138 mmol/L (ref 135–145)
TCO2: 26 mmol/L (ref 22–32)

## 2023-10-14 SURGERY — ARTERIOVENOUS (AV) FISTULA CREATION
Anesthesia: General | Laterality: Left

## 2023-10-14 MED ORDER — FENTANYL CITRATE (PF) 250 MCG/5ML IJ SOLN
INTRAMUSCULAR | Status: AC
  Filled 2023-10-14: qty 5

## 2023-10-14 MED ORDER — FENTANYL CITRATE (PF) 100 MCG/2ML IJ SOLN
INTRAMUSCULAR | Status: DC | PRN
  Administered 2023-10-14: 50 ug via INTRAVENOUS

## 2023-10-14 MED ORDER — FENTANYL CITRATE (PF) 100 MCG/2ML IJ SOLN: 25.0000 ug | INTRAMUSCULAR | Status: DC | PRN

## 2023-10-14 MED ORDER — ONDANSETRON HCL 4 MG/2ML IJ SOLN: 4.0000 mg | Freq: Four times a day (QID) | INTRAMUSCULAR | Status: DC | PRN

## 2023-10-14 MED ORDER — OXYCODONE HCL 5 MG/5ML PO SOLN: 5.0000 mg | Freq: Once | ORAL | Status: DC | PRN

## 2023-10-14 MED ORDER — CHLORHEXIDINE GLUCONATE 0.12 % MT SOLN
15.0000 mL | Freq: Once | OROMUCOSAL | Status: AC
  Administered 2023-10-14: 15 mL via OROMUCOSAL
  Filled 2023-10-14: qty 15

## 2023-10-14 MED ORDER — CHLORHEXIDINE GLUCONATE 4 % EX SOLN: 60.0000 mL | Freq: Once | CUTANEOUS | Status: DC

## 2023-10-14 MED ORDER — SODIUM CHLORIDE 0.9% FLUSH
3.0000 mL | Freq: Two times a day (BID) | INTRAVENOUS | Status: DC
  Administered 2023-10-14: 3 mL via INTRAVENOUS

## 2023-10-14 MED ORDER — SODIUM CHLORIDE 0.9 % IV SOLN
INTRAVENOUS | Status: DC | PRN
Start: 2023-10-14 — End: 2023-10-14

## 2023-10-14 MED ORDER — OXYCODONE HCL 5 MG PO TABS: 5.0000 mg | ORAL_TABLET | Freq: Once | ORAL | Status: DC | PRN

## 2023-10-14 MED ORDER — LIDOCAINE 2% (20 MG/ML) 5 ML SYRINGE
INTRAMUSCULAR | Status: DC | PRN
  Administered 2023-10-14: 60 mg via INTRAVENOUS

## 2023-10-14 MED ORDER — SODIUM CHLORIDE 0.9% FLUSH: 3.0000 mL | INTRAVENOUS | Status: DC | PRN

## 2023-10-14 MED ORDER — PROPOFOL 10 MG/ML IV BOLUS
INTRAVENOUS | Status: DC | PRN
  Administered 2023-10-14: 150 mg via INTRAVENOUS
  Administered 2023-10-14: 50 mg via INTRAVENOUS

## 2023-10-14 MED ORDER — HEPARIN 6000 UNIT IRRIGATION SOLUTION
Status: AC
  Filled 2023-10-14: qty 500

## 2023-10-14 MED ORDER — PROPOFOL 10 MG/ML IV BOLUS
INTRAVENOUS | Status: AC
  Filled 2023-10-14: qty 20

## 2023-10-14 MED ORDER — HEPARIN 6000 UNIT IRRIGATION SOLUTION
Status: DC | PRN
  Administered 2023-10-14: 1

## 2023-10-14 MED ORDER — 0.9 % SODIUM CHLORIDE (POUR BTL) OPTIME
TOPICAL | Status: DC | PRN
  Administered 2023-10-14: 1000 mL

## 2023-10-14 MED ORDER — ORAL CARE MOUTH RINSE: 15.0000 mL | Freq: Once | OROMUCOSAL | Status: AC

## 2023-10-14 MED ORDER — MIDAZOLAM HCL 2 MG/2ML IJ SOLN
INTRAMUSCULAR | Status: AC
  Filled 2023-10-14: qty 2

## 2023-10-14 MED ORDER — HYDROCODONE-ACETAMINOPHEN 5-325 MG PO TABS
1.0000 | ORAL_TABLET | Freq: Four times a day (QID) | ORAL | 0 refills | Status: DC | PRN
  Filled 2023-10-14: qty 10, 3d supply, fill #0

## 2023-10-14 MED ORDER — CEFAZOLIN SODIUM-DEXTROSE 2-4 GM/100ML-% IV SOLN
2.0000 g | INTRAVENOUS | Status: AC
  Administered 2023-10-14: 2 g via INTRAVENOUS
  Filled 2023-10-14: qty 100

## 2023-10-14 SURGICAL SUPPLY — 25 items
ARMBAND PINK RESTRICT EXTREMIT (MISCELLANEOUS) ×2 IMPLANT
BAG COUNTER SPONGE SURGICOUNT (BAG) ×1 IMPLANT
CANISTER SUCTION 3000ML PPV (SUCTIONS) ×1 IMPLANT
CLIP TI MEDIUM 6 (CLIP) ×1 IMPLANT
CLIP TI WIDE RED SMALL 6 (CLIP) ×1 IMPLANT
COVER PROBE W GEL 5X96 (DRAPES) ×1 IMPLANT
DERMABOND ADVANCED .7 DNX12 (GAUZE/BANDAGES/DRESSINGS) ×1 IMPLANT
DERMABOND ADVANCED .7 DNX6 (GAUZE/BANDAGES/DRESSINGS) IMPLANT
ELECTRODE REM PT RTRN 9FT ADLT (ELECTROSURGICAL) ×1 IMPLANT
GLOVE SURG SS PI 7.5 STRL IVOR (GLOVE) ×3 IMPLANT
GOWN STRL REUS W/ TWL LRG LVL3 (GOWN DISPOSABLE) ×2 IMPLANT
GOWN STRL REUS W/ TWL XL LVL3 (GOWN DISPOSABLE) ×1 IMPLANT
HEMOSTAT SNOW SURGICEL 2X4 (HEMOSTASIS) IMPLANT
KIT BASIN OR (CUSTOM PROCEDURE TRAY) ×1 IMPLANT
KIT TURNOVER KIT B (KITS) ×1 IMPLANT
NS IRRIG 1000ML POUR BTL (IV SOLUTION) ×1 IMPLANT
PACK CV ACCESS (CUSTOM PROCEDURE TRAY) ×1 IMPLANT
PAD ARMBOARD POSITIONER FOAM (MISCELLANEOUS) ×2 IMPLANT
SLING ARM FOAM STRAP LRG (SOFTGOODS) IMPLANT
SUT PROLENE 6 0 CC (SUTURE) ×1 IMPLANT
SUT VIC AB 3-0 SH 27X BRD (SUTURE) ×1 IMPLANT
SUT VIC AB 4-0 PS2 18 (SUTURE) ×1 IMPLANT
TOWEL GREEN STERILE (TOWEL DISPOSABLE) ×1 IMPLANT
UNDERPAD 30X36 HEAVY ABSORB (UNDERPADS AND DIAPERS) ×1 IMPLANT
WATER STERILE IRR 1000ML POUR (IV SOLUTION) ×1 IMPLANT

## 2023-10-14 NOTE — Discharge Instructions (Signed)

## 2023-10-14 NOTE — Op Note (Signed)
    Patient name: Jasmine [name] MRN: [id number] DOB: 11/21/1970 Sex: female  10/14/2023 Pre-operative Diagnosis: ESRD Post-operative diagnosis:  Same Surgeon:  Gareld June Assistants:  Naida Austria, PA Procedure:   Left brachio-basilic fistula Anesthesia:  General Blood Loss:  minimal Specimens:  none  Findings: 3.5 mm vein.  3 mm disease-free artery  Indications: This is a 53 year old female with end-stage renal disease dialyzing through a catheter.  She recently had a cardiac stent placed.  She needs permanent access.  This is being done without stopping her Plavix .  Procedure:  The patient was identified in the holding area and taken to [facility] OR ROOM 16  The patient was then placed supine on the table. general anesthesia was administered.  The patient was prepped and draped in the usual sterile fashion.  A time out was called and antibiotics were administered.  A PA was necessary to expedite the procedure and assist with technical details.  He helped with exposure by providing suction and retraction.  He help with the anastomosis by following the suture.  He help with wound closure.  Ultrasound was used to evaluate the surface veins in the left arm.  I did not feel that the cephalic vein was adequate and so the basilic vein was used.  An oblique incision was made just proximal to the antecubital crease.  I first dissected out the brachial artery which was a disease-free 3 mm artery.  I then exposed the basilic vein, protecting the nerve.  Side branches were ligated between silk ties.  The vein was fully mobilized and marked for orientation.  It was ligated distally with a 3-0 silk tie.  The vein distended to about 3 and half millimeters with heparin  saline.  The brachial artery was occluded with vascular clamps and then opened with a #11 blade.  The arteriotomy was extended with Potts scissors in a longitudinal fashion.  The vein was cut the appropriate length and spatulated to fit the size the  arteriotomy.  A running anastomosis was created with 6-0 Prolene.  Prior to completion, the appropriate flushing maneuvers were performed and the anastomosis was completed.  I inspected the course of the vein to make sure there were no kinks.  The patient had an excellent thrill within her fistula and a multiphasic radial Doppler signal.  The wound was then irrigated.  Hemostasis was achieved.  The incision was closed with 2 layers of Vicryl followed by Dermabond.  There were no immediate complications.   Disposition: To PACU stable.   Reinaldo Caras, M.D., [facility] [place] Office: [phone] Pager:  [phone]

## 2023-10-14 NOTE — Interval H&P Note (Signed)
 History and Physical Interval Note:  10/14/2023 8:13 AM  Jasmine [name]  has presented today for surgery, with the diagnosis of ESRD.  The various methods of treatment have been discussed with the patient and family. After consideration of risks, benefits and other options for treatment, the patient has consented to  Procedure(s): ARTERIOVENOUS (AV) FISTULA CREATION (Left) as a surgical intervention.  The patient's history has been reviewed, patient examined, no change in status, stable for surgery.  I have reviewed the patient's chart and labs.  Questions were answered to the patient's satisfaction.     Gareld June

## 2023-10-14 NOTE — Anesthesia Procedure Notes (Signed)
 Procedure Name: LMA Insertion Date/Time: 10/14/2023 10:14 AM  Performed by: Dyanna Glasgow, CRNAPre-anesthesia Checklist: Patient identified, Emergency Drugs available, Suction available and Patient being monitored Patient Re-evaluated:Patient Re-evaluated prior to induction Oxygen Delivery Method: Circle system utilized Preoxygenation: Pre-oxygenation with 100% oxygen Induction Type: IV induction LMA: LMA inserted LMA Size: 4.0 Number of attempts: 1 Placement Confirmation: positive ETCO2 and breath sounds checked- equal and bilateral Tube secured with: Tape Dental Injury: Teeth and Oropharynx as per pre-operative assessment

## 2023-10-14 NOTE — Transfer of Care (Signed)
 Immediate Anesthesia Transfer of Care Note  Patient: Jasmine [name]  Procedure(s) Performed: BRACHIOBASILIC ARTERIOVENOUS (AV) FISTULA CREATION LEFT (Left)  Patient Location: PACU  Anesthesia Type:General  Level of Consciousness: awake and alert   Airway & Oxygen Therapy: Patient Spontanous Breathing and Patient connected to nasal cannula oxygen  Post-op Assessment: Report given to RN and Post -op Vital signs reviewed and stable  Post vital signs: Reviewed and stable  Last Vitals:  Vitals Value Taken Time  BP 113/54 10/14/23 1135  Temp    Pulse 74 10/14/23 1136  Resp 14 10/14/23 1136  SpO2 94 % 10/14/23 1136  Vitals shown include unfiled device data.  Last Pain:  Vitals:   10/14/23 0805  PainSc: 0-No pain         Complications: No notable events documented.

## 2023-10-14 NOTE — Progress Notes (Signed)
 Pt states that she forgot to reduce the her basal rate of Insulin  pump last night. CBG 84; Per dr. Joyce Nixon -to reduce the basal rate by 50%. Pt adjusted her rate.

## 2023-10-17 ENCOUNTER — Encounter (HOSPITAL_COMMUNITY): Payer: Self-pay | Admitting: Surgery

## 2023-10-17 NOTE — Anesthesia Postprocedure Evaluation (Signed)
 Anesthesia Post Note  Patient: Jasmine [name]  Procedure(s) Performed: BRACHIOBASILIC ARTERIOVENOUS (AV) FISTULA CREATION LEFT (Left)     Patient location during evaluation: PACU Anesthesia Type: General Level of consciousness: awake and alert Pain management: pain level controlled Vital Signs Assessment: post-procedure vital signs reviewed and stable Respiratory status: spontaneous breathing, nonlabored ventilation, respiratory function stable and patient connected to nasal cannula oxygen Cardiovascular status: blood pressure returned to baseline and stable Postop Assessment: no apparent nausea or vomiting Anesthetic complications: no   No notable events documented.  Last Vitals:  Vitals:   10/14/23 1245 10/14/23 1246  BP: (!) 157/74   Pulse: 70 70  Resp: 14 16  Temp:  36.8 C  SpO2: 91% 97%    Last Pain:  Vitals:   10/14/23 1135  PainSc: 0-No pain                 Caylei Sperry S

## 2023-10-21 ENCOUNTER — Ambulatory Visit (HOSPITAL_BASED_OUTPATIENT_CLINIC_OR_DEPARTMENT_OTHER): Payer: BC Managed Care – PPO | Admitting: Pulmonary Disease

## 2023-10-21 ENCOUNTER — Encounter (HOSPITAL_BASED_OUTPATIENT_CLINIC_OR_DEPARTMENT_OTHER): Payer: Self-pay | Admitting: Pulmonary Disease

## 2023-10-21 VITALS — BP 114/54 | HR 79 | Ht 69.0 in | Wt 261.0 lb

## 2023-10-21 DIAGNOSIS — N186 End stage renal disease: Secondary | ICD-10-CM

## 2023-10-21 DIAGNOSIS — J45909 Unspecified asthma, uncomplicated: Secondary | ICD-10-CM | POA: Diagnosis not present

## 2023-10-21 DIAGNOSIS — G4733 Obstructive sleep apnea (adult) (pediatric): Secondary | ICD-10-CM | POA: Diagnosis not present

## 2023-10-21 DIAGNOSIS — Z9981 Dependence on supplemental oxygen: Secondary | ICD-10-CM

## 2023-10-21 DIAGNOSIS — Z992 Dependence on renal dialysis: Secondary | ICD-10-CM

## 2023-10-21 NOTE — Progress Notes (Signed)
 Subjective:    Patient ID: Jasmine [name], female    DOB: 04-Dec-1970, 53 y.o.   MRN: [id number]  HPI  53 yo IDDM  for FU of OSA & asthma, mild pulmonary hypertension. -on CPAP + O2 She reports longstanding history of asthma for which she takes albuterol  MDI as needed.  This is worse during the pollen season   PMH -IDDM on insulin  pump, CKD stage 5, multiple lung nodules stable from 20 18-20 20 RA -diagnosed 02/2020 ([facility])  Covid infection 05/2020 CAD    3 month follow-up.  She experiences ongoing shortness of breath, though not severe, and uses albuterol  inhalers for asthma management. She is compliant with her asthma treatment. She uses a CPAP machine for sleep apnea with settings on auto from ten to fifteen centimeters, and a pressure of about thirteen to fourteen maximum. Compliance is excellent. A mask leak in April improved after changing the mask.  She is on dialysis but still produces urine, aiding in fluid management. A fistula was placed last Friday. She continues to take a diuretic on her days off dialysis. She reports losing balance when standing up too quickly.  Her diabetes is managed with an insulin  pump, maintaining an A1c usually in the 5 range. Blood sugars are stable. She has lost fifty to sixty pounds but has started regaining some weight.  download reviewed and this again shows excellent compliance on auto settings 10 to 15 cm with average pressure of 13 and maximum pressure of 14 cm.  She had a small leak.  Resolved. I reviewed previous downloads going back several months and she has always been very compliant  Significant tests/ events reviewed   10/2021 ONO on CPAP/RA >> desat >2h  Ambulation 10/05/19 OV >> dropped to 88%  04/2019 ABG 7.42/43/ 75     CT chest 04/2019 no emphysema, stable nodules   PFTs 04/2019-no airway obstruction, ratio 87, FEV1 79%, FVC 72%, TLC 82%, DLCO 76%   Echo 04/2017 normal LVEF, RVSP 38     CPAP titration >> 15 cm, Sm FF  mask HST showed severe OSA - worse than before - avg 37 events/ hr with drop in O2 levels HST 07/2016 - moderate OSA with AHI 25/hour especially worse when supine with AHI 35/hour and lowest desaturation of 65%  Review of Systems neg for any significant sore throat, dysphagia, itching, sneezing, nasal congestion or excess/ purulent secretions, fever, chills, sweats, unintended wt loss, pleuritic or exertional cp, hempoptysis, orthopnea pnd or change in chronic leg swelling. Also denies presyncope, palpitations, heartburn, abdominal pain, nausea, vomiting, diarrhea or change in bowel or urinary habits, dysuria,hematuria, rash, arthralgias, visual complaints, headache, numbness weakness or ataxia.     Objective:   Physical Exam  Gen. Pleasant, obese, in no distress ENT - no lesions, no post nasal drip Neck: No JVD, no thyromegaly, no carotid bruits Lungs: no use of accessory muscles, no dullness to percussion, decreased without rales or rhonchi  Cardiovascular: Rhythm regular, heart sounds  normal, no murmurs or gallops, no peripheral edema Musculoskeletal: No deformities, no cyanosis or clubbing , no tremors       Assessment & Plan:   Asthma Experiences mild dyspnea and uses albuterol  inhaler as needed.  Obstructive sleep apnea Compliant with CPAP therapy, using an auto setting from 10 to 15 cm H2O, with a pressure requirement of about 13 to 14 cm H2O. Previous mask leakage issue resolved. CPAP settings accidentally altered by husband, but pressure settings remain correct. -  Document compliance with CPAP therapy in medical notes. - She is very compliant and CPAP is only helped improve her daytime somnolence and fatigue - Will recheck nocturnal oximetry on CPAP/room air  End-stage renal disease on dialysis On dialysis with residual urine output aiding fluid management. Fistula placed last Friday, requires three months to heal. Denied transplant due to recent stents; must wait a year  post-stent placement and discontinue Plavix . Frailty cited as a concern for transplant eligibility, though she does not perceive herself as frail.

## 2023-10-21 NOTE — Patient Instructions (Signed)
 X check ONO on CPAP/room air  Send notes to adapt for compliance
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
## 2023-10-28 ENCOUNTER — Ambulatory Visit (HOSPITAL_BASED_OUTPATIENT_CLINIC_OR_DEPARTMENT_OTHER): Payer: BC Managed Care – PPO | Admitting: Family

## 2023-10-28 ENCOUNTER — Telehealth: Payer: Self-pay

## 2023-10-28 ENCOUNTER — Telehealth (INDEPENDENT_AMBULATORY_CARE_PROVIDER_SITE_OTHER): Admitting: Psychiatry

## 2023-10-28 ENCOUNTER — Encounter: Payer: Self-pay | Admitting: Psychiatry

## 2023-10-28 DIAGNOSIS — F411 Generalized anxiety disorder: Secondary | ICD-10-CM

## 2023-10-28 DIAGNOSIS — G4700 Insomnia, unspecified: Secondary | ICD-10-CM | POA: Diagnosis not present

## 2023-10-28 DIAGNOSIS — F603 Borderline personality disorder: Secondary | ICD-10-CM

## 2023-10-28 DIAGNOSIS — F3181 Bipolar II disorder: Secondary | ICD-10-CM | POA: Diagnosis not present

## 2023-10-28 MED ORDER — LORAZEPAM 0.5 MG PO TABS: 0.5000 mg | ORAL_TABLET | Freq: Every day | ORAL | 0 refills | Status: DC | PRN

## 2023-10-28 MED ORDER — BUPROPION HCL ER (XL) 150 MG PO TB24: 150.0000 mg | ORAL_TABLET | Freq: Every day | ORAL | 3 refills | Status: DC

## 2023-10-28 NOTE — Telephone Encounter (Signed)
 Copied from CRM [id number]. Topic: General - Other >> Oct 28, 2023 12:41 PM Juliaette Ober wrote: Reason for CRM: fax for apeal manager [phone], fax for external review manager [phone]    Thank you Faxed all documentation for PT. NFN

## 2023-10-28 NOTE — Telephone Encounter (Signed)
 Copied from CRM [id number]. Topic: Clinical - Medication Question >> Oct 26, 2023  4:50 PM Jasmine [name] wrote: Reason for CRM: PT CALLED STATED SHE RECEIVED A LETTER FROM HER INSURANCE STATING SHE WAS NOT COMPLIANT WITH USING HER CPAP MACHINE AND SHE IS WANTING TO KNOW WHAT SHE NEEDS TO DO SO THE INSURANCE WILL COVER THE MACHINE. STATED MULTIPLE CLAIMS HAVE BEEN REJECTED STATING SHE WAS NOT COMPLIANT.   Spoke w/ PT and she indeed has been compliant I requested if she could give me the information to fax paperwork to whom ,so it can be addressed.

## 2023-10-30 ENCOUNTER — Encounter (HOSPITAL_COMMUNITY): Payer: Self-pay | Admitting: *Deleted

## 2023-10-30 ENCOUNTER — Emergency Department (HOSPITAL_COMMUNITY)

## 2023-10-30 ENCOUNTER — Other Ambulatory Visit: Payer: Self-pay

## 2023-10-30 ENCOUNTER — Inpatient Hospital Stay (HOSPITAL_COMMUNITY)
Admission: EM | Admit: 2023-10-30 | Discharge: 2023-11-03 | DRG: 919 | Disposition: A | Discharge status: Home / Self Care | Attending: Internal Medicine | Admitting: Internal Medicine

## 2023-10-30 DIAGNOSIS — T85614A Breakdown (mechanical) of insulin pump, initial encounter: Principal | ICD-10-CM | POA: Diagnosis present

## 2023-10-30 DIAGNOSIS — Z79890 Hormone replacement therapy (postmenopausal): Secondary | ICD-10-CM

## 2023-10-30 DIAGNOSIS — I25118 Atherosclerotic heart disease of native coronary artery with other forms of angina pectoris: Secondary | ICD-10-CM | POA: Diagnosis not present

## 2023-10-30 DIAGNOSIS — Z6837 Body mass index (BMI) 37.0-37.9, adult: Secondary | ICD-10-CM

## 2023-10-30 DIAGNOSIS — T383X6A Underdosing of insulin and oral hypoglycemic [antidiabetic] drugs, initial encounter: Secondary | ICD-10-CM | POA: Diagnosis present

## 2023-10-30 DIAGNOSIS — E66812 Obesity, class 2: Secondary | ICD-10-CM | POA: Diagnosis present

## 2023-10-30 DIAGNOSIS — I5022 Chronic systolic (congestive) heart failure: Secondary | ICD-10-CM | POA: Diagnosis present

## 2023-10-30 DIAGNOSIS — Z7902 Long term (current) use of antithrombotics/antiplatelets: Secondary | ICD-10-CM | POA: Diagnosis not present

## 2023-10-30 DIAGNOSIS — F3181 Bipolar II disorder: Secondary | ICD-10-CM | POA: Diagnosis present

## 2023-10-30 DIAGNOSIS — J9611 Chronic respiratory failure with hypoxia: Secondary | ICD-10-CM | POA: Diagnosis present

## 2023-10-30 DIAGNOSIS — I214 Non-ST elevation (NSTEMI) myocardial infarction: Secondary | ICD-10-CM

## 2023-10-30 DIAGNOSIS — E1110 Type 2 diabetes mellitus with ketoacidosis without coma: Secondary | ICD-10-CM | POA: Diagnosis present

## 2023-10-30 DIAGNOSIS — Z7982 Long term (current) use of aspirin: Secondary | ICD-10-CM

## 2023-10-30 DIAGNOSIS — R079 Chest pain, unspecified: Secondary | ICD-10-CM | POA: Diagnosis not present

## 2023-10-30 DIAGNOSIS — N2581 Secondary hyperparathyroidism of renal origin: Secondary | ICD-10-CM | POA: Diagnosis present

## 2023-10-30 DIAGNOSIS — M797 Fibromyalgia: Secondary | ICD-10-CM | POA: Diagnosis present

## 2023-10-30 DIAGNOSIS — E782 Mixed hyperlipidemia: Secondary | ICD-10-CM | POA: Diagnosis present

## 2023-10-30 DIAGNOSIS — M549 Dorsalgia, unspecified: Secondary | ICD-10-CM | POA: Diagnosis present

## 2023-10-30 DIAGNOSIS — E0810 Diabetes mellitus due to underlying condition with ketoacidosis without coma: Secondary | ICD-10-CM

## 2023-10-30 DIAGNOSIS — R739 Hyperglycemia, unspecified: Secondary | ICD-10-CM | POA: Diagnosis present

## 2023-10-30 DIAGNOSIS — Z79899 Other long term (current) drug therapy: Secondary | ICD-10-CM

## 2023-10-30 DIAGNOSIS — Z955 Presence of coronary angioplasty implant and graft: Secondary | ICD-10-CM

## 2023-10-30 DIAGNOSIS — Z833 Family history of diabetes mellitus: Secondary | ICD-10-CM

## 2023-10-30 DIAGNOSIS — N189 Chronic kidney disease, unspecified: Secondary | ICD-10-CM

## 2023-10-30 DIAGNOSIS — Z992 Dependence on renal dialysis: Secondary | ICD-10-CM | POA: Diagnosis not present

## 2023-10-30 DIAGNOSIS — G4733 Obstructive sleep apnea (adult) (pediatric): Secondary | ICD-10-CM

## 2023-10-30 DIAGNOSIS — Z8052 Family history of malignant neoplasm of bladder: Secondary | ICD-10-CM

## 2023-10-30 DIAGNOSIS — E1010 Type 1 diabetes mellitus with ketoacidosis without coma: Principal | ICD-10-CM | POA: Diagnosis present

## 2023-10-30 DIAGNOSIS — N186 End stage renal disease: Secondary | ICD-10-CM | POA: Diagnosis present

## 2023-10-30 DIAGNOSIS — I252 Old myocardial infarction: Secondary | ICD-10-CM

## 2023-10-30 DIAGNOSIS — Z818 Family history of other mental and behavioral disorders: Secondary | ICD-10-CM

## 2023-10-30 DIAGNOSIS — I10 Essential (primary) hypertension: Secondary | ICD-10-CM | POA: Diagnosis not present

## 2023-10-30 DIAGNOSIS — I2510 Atherosclerotic heart disease of native coronary artery without angina pectoris: Secondary | ICD-10-CM | POA: Diagnosis present

## 2023-10-30 DIAGNOSIS — I132 Hypertensive heart and chronic kidney disease with heart failure and with stage 5 chronic kidney disease, or end stage renal disease: Secondary | ICD-10-CM | POA: Diagnosis present

## 2023-10-30 DIAGNOSIS — Z888 Allergy status to other drugs, medicaments and biological substances status: Secondary | ICD-10-CM

## 2023-10-30 DIAGNOSIS — D631 Anemia in chronic kidney disease: Secondary | ICD-10-CM | POA: Diagnosis present

## 2023-10-30 DIAGNOSIS — E039 Hypothyroidism, unspecified: Secondary | ICD-10-CM | POA: Diagnosis present

## 2023-10-30 DIAGNOSIS — Z83438 Family history of other disorder of lipoprotein metabolism and other lipidemia: Secondary | ICD-10-CM

## 2023-10-30 DIAGNOSIS — Z807 Family history of other malignant neoplasms of lymphoid, hematopoietic and related tissues: Secondary | ICD-10-CM

## 2023-10-30 DIAGNOSIS — Z881 Allergy status to other antibiotic agents status: Secondary | ICD-10-CM

## 2023-10-30 DIAGNOSIS — Z87891 Personal history of nicotine dependence: Secondary | ICD-10-CM

## 2023-10-30 DIAGNOSIS — Z794 Long term (current) use of insulin: Secondary | ICD-10-CM | POA: Diagnosis not present

## 2023-10-30 DIAGNOSIS — Z825 Family history of asthma and other chronic lower respiratory diseases: Secondary | ICD-10-CM

## 2023-10-30 DIAGNOSIS — Z8249 Family history of ischemic heart disease and other diseases of the circulatory system: Secondary | ICD-10-CM

## 2023-10-30 DIAGNOSIS — E1022 Type 1 diabetes mellitus with diabetic chronic kidney disease: Secondary | ICD-10-CM | POA: Diagnosis present

## 2023-10-30 DIAGNOSIS — R7989 Other specified abnormal findings of blood chemistry: Secondary | ICD-10-CM | POA: Diagnosis present

## 2023-10-30 DIAGNOSIS — Z8614 Personal history of Methicillin resistant Staphylococcus aureus infection: Secondary | ICD-10-CM

## 2023-10-30 DIAGNOSIS — Z8349 Family history of other endocrine, nutritional and metabolic diseases: Secondary | ICD-10-CM

## 2023-10-30 LAB — GLUCOSE, CAPILLARY
Glucose-Capillary: 149 mg/dL — ABNORMAL HIGH (ref 70–99)
Glucose-Capillary: 219 mg/dL — ABNORMAL HIGH (ref 70–99)
Glucose-Capillary: 220 mg/dL — ABNORMAL HIGH (ref 70–99)
Glucose-Capillary: 322 mg/dL — ABNORMAL HIGH (ref 70–99)
Glucose-Capillary: 392 mg/dL — ABNORMAL HIGH (ref 70–99)
Glucose-Capillary: 500 mg/dL — ABNORMAL HIGH (ref 70–99)
Glucose-Capillary: 561 mg/dL (ref 70–99)
Glucose-Capillary: >600 mg/dL (ref 70–99)
Glucose-Capillary: >600 mg/dL (ref 70–99)
Glucose-Capillary: >600 mg/dL (ref 70–99)

## 2023-10-30 LAB — HEPATITIS B SURFACE ANTIGEN: Hepatitis B Surface Ag: NONREACTIVE

## 2023-10-30 LAB — URINALYSIS, ROUTINE W REFLEX MICROSCOPIC
Bacteria, UA: NONE SEEN
Bilirubin Urine: NEGATIVE
Glucose, UA: >=500 mg/dL — AB
Hgb urine dipstick: NEGATIVE
Ketones, ur: 20 mg/dL — AB
Leukocytes,Ua: NEGATIVE
Nitrite: NEGATIVE
Protein, ur: 100 mg/dL — AB
Specific Gravity, Urine: 1.011 (ref 1.005–1.030)
pH: 7 (ref 5.0–8.0)

## 2023-10-30 LAB — COMPREHENSIVE METABOLIC PANEL WITH GFR
ALT: 13 U/L (ref 0–44)
AST: 21 U/L (ref 15–41)
Albumin: 3.3 g/dL — ABNORMAL LOW (ref 3.5–5.0)
Alkaline Phosphatase: 134 U/L — ABNORMAL HIGH (ref 38–126)
Anion gap: 18 — ABNORMAL HIGH (ref 5–15)
BUN: 29 mg/dL — ABNORMAL HIGH (ref 6–20)
CO2: 20 mmol/L — ABNORMAL LOW (ref 22–32)
Calcium: 8.7 mg/dL — ABNORMAL LOW (ref 8.9–10.3)
Chloride: 82 mmol/L — ABNORMAL LOW (ref 98–111)
Creatinine, Ser: 3.08 mg/dL — ABNORMAL HIGH (ref 0.44–1.00)
GFR, Estimated: 18 mL/min — ABNORMAL LOW (ref >60)
Glucose, Bld: 983 mg/dL (ref 70–99)
Potassium: 5.1 mmol/L (ref 3.5–5.1)
Sodium: 120 mmol/L — ABNORMAL LOW (ref 135–145)
Total Bilirubin: 2 mg/dL — ABNORMAL HIGH (ref 0.0–1.2)
Total Protein: 6.7 g/dL (ref 6.5–8.1)

## 2023-10-30 LAB — I-STAT CHEM 8, ED
BUN: 40 mg/dL — ABNORMAL HIGH (ref 6–20)
Calcium, Ion: 1.04 mmol/L — ABNORMAL LOW (ref 1.15–1.40)
Chloride: 85 mmol/L — ABNORMAL LOW (ref 98–111)
Creatinine, Ser: 3 mg/dL — ABNORMAL HIGH (ref 0.44–1.00)
Glucose, Bld: >700 mg/dL (ref 70–99)
HCT: 29 % — ABNORMAL LOW (ref 36.0–46.0)
Hemoglobin: 9.9 g/dL — ABNORMAL LOW (ref 12.0–15.0)
Potassium: 5.7 mmol/L — ABNORMAL HIGH (ref 3.5–5.1)
Sodium: 119 mmol/L — CL (ref 135–145)
TCO2: 21 mmol/L — ABNORMAL LOW (ref 22–32)

## 2023-10-30 LAB — BASIC METABOLIC PANEL WITH GFR
Anion gap: 14 (ref 5–15)
Anion gap: 11 (ref 5–15)
BUN: 35 mg/dL — ABNORMAL HIGH (ref 6–20)
BUN: 33 mg/dL — ABNORMAL HIGH (ref 6–20)
CO2: 25 mmol/L (ref 22–32)
CO2: 27 mmol/L (ref 22–32)
Calcium: 9.1 mg/dL (ref 8.9–10.3)
Calcium: 9.5 mg/dL (ref 8.9–10.3)
Chloride: 85 mmol/L — ABNORMAL LOW (ref 98–111)
Chloride: 93 mmol/L — ABNORMAL LOW (ref 98–111)
Creatinine, Ser: 3.39 mg/dL — ABNORMAL HIGH (ref 0.44–1.00)
Creatinine, Ser: 3.52 mg/dL — ABNORMAL HIGH (ref 0.44–1.00)
GFR, Estimated: 16 mL/min — ABNORMAL LOW (ref >60)
GFR, Estimated: 15 mL/min — ABNORMAL LOW (ref >60)
Glucose, Bld: 706 mg/dL (ref 70–99)
Glucose, Bld: 212 mg/dL — ABNORMAL HIGH (ref 70–99)
Potassium: 3.6 mmol/L (ref 3.5–5.1)
Potassium: 3.4 mmol/L — ABNORMAL LOW (ref 3.5–5.1)
Sodium: 124 mmol/L — ABNORMAL LOW (ref 135–145)
Sodium: 129 mmol/L — ABNORMAL LOW (ref 135–145)

## 2023-10-30 LAB — CBC
HCT: 28.8 % — ABNORMAL LOW (ref 36.0–46.0)
Hemoglobin: 8.8 g/dL — ABNORMAL LOW (ref 12.0–15.0)
MCH: 29.8 pg (ref 26.0–34.0)
MCHC: 30.6 g/dL (ref 30.0–36.0)
MCV: 97.6 fL (ref 80.0–100.0)
Platelets: 190 10*3/uL (ref 150–400)
RBC: 2.95 MIL/uL — ABNORMAL LOW (ref 3.87–5.11)
RDW: 12.7 % (ref 11.5–15.5)
WBC: 5.6 10*3/uL (ref 4.0–10.5)
nRBC: 0 % (ref 0.0–0.2)

## 2023-10-30 LAB — TROPONIN I (HIGH SENSITIVITY)
Troponin I (High Sensitivity): 476 ng/L (ref <18)
Troponin I (High Sensitivity): 579 ng/L (ref <18)
Troponin I (High Sensitivity): 739 ng/L (ref <18)

## 2023-10-30 LAB — BETA-HYDROXYBUTYRIC ACID: Beta-Hydroxybutyric Acid: 3.88 mmol/L — ABNORMAL HIGH (ref 0.05–0.27)

## 2023-10-30 LAB — BLOOD GAS, VENOUS
Acid-base deficit: 3.9 mmol/L — ABNORMAL HIGH (ref 0.0–2.0)
Bicarbonate: 21.6 mmol/L (ref 20.0–28.0)
O2 Saturation: 53.4 %
Patient temperature: 36.7
pCO2, Ven: 39 mmHg — ABNORMAL LOW (ref 44–60)
pH, Ven: 7.34 (ref 7.25–7.43)
pO2, Ven: 31 mmHg — CL (ref 32–45)

## 2023-10-30 LAB — CBG MONITORING, ED
Glucose-Capillary: >600 mg/dL (ref 70–99)
Glucose-Capillary: >600 mg/dL (ref 70–99)
Glucose-Capillary: >600 mg/dL (ref 70–99)
Glucose-Capillary: >600 mg/dL (ref 70–99)

## 2023-10-30 LAB — MRSA NEXT GEN BY PCR, NASAL: MRSA by PCR Next Gen: NOT DETECTED

## 2023-10-30 MED ORDER — CLOPIDOGREL BISULFATE 75 MG PO TABS
75.0000 mg | ORAL_TABLET | Freq: Every day | ORAL | Status: DC
  Administered 2023-10-31 – 2023-11-03 (×4): 75 mg via ORAL
  Filled 2023-10-30 (×4): qty 1

## 2023-10-30 MED ORDER — TORSEMIDE 20 MG PO TABS
40.0000 mg | ORAL_TABLET | ORAL | Status: DC
  Administered 2023-10-31 – 2023-11-02 (×2): 40 mg via ORAL
  Filled 2023-10-30 (×2): qty 2

## 2023-10-30 MED ORDER — HYDRALAZINE HCL 25 MG PO TABS
25.0000 mg | ORAL_TABLET | Freq: Two times a day (BID) | ORAL | Status: DC
  Administered 2023-10-30 – 2023-10-31 (×3): 25 mg via ORAL
  Filled 2023-10-30 (×3): qty 1

## 2023-10-30 MED ORDER — LACTATED RINGERS IV BOLUS
1000.0000 mL | Freq: Once | INTRAVENOUS | Status: AC
  Administered 2023-10-30: 1000 mL via INTRAVENOUS

## 2023-10-30 MED ORDER — DEXTROSE 50 % IV SOLN: 0.0000 mL | INTRAVENOUS | Status: DC | PRN

## 2023-10-30 MED ORDER — HEPARIN SODIUM (PORCINE) 5000 UNIT/ML IJ SOLN
5000.0000 [IU] | Freq: Three times a day (TID) | INTRAMUSCULAR | Status: DC
  Administered 2023-10-30: 5000 [IU] via SUBCUTANEOUS
  Filled 2023-10-30: qty 1

## 2023-10-30 MED ORDER — INSULIN ASPART 100 UNIT/ML IJ SOLN
0.0000 [IU] | INTRAMUSCULAR | Status: DC
  Administered 2023-10-30: 3 [IU] via SUBCUTANEOUS
  Administered 2023-10-31 (×2): 9 [IU] via SUBCUTANEOUS
  Administered 2023-10-31: 7 [IU] via SUBCUTANEOUS
  Administered 2023-10-31: 5 [IU] via SUBCUTANEOUS
  Administered 2023-11-01: 1 [IU] via SUBCUTANEOUS
  Administered 2023-11-01: 2 [IU] via SUBCUTANEOUS
  Administered 2023-11-01: 7 [IU] via SUBCUTANEOUS
  Administered 2023-11-01: 3 [IU] via SUBCUTANEOUS
  Administered 2023-11-01: 5 [IU] via SUBCUTANEOUS
  Administered 2023-11-01: 7 [IU] via SUBCUTANEOUS
  Administered 2023-11-02 (×2): 2 [IU] via SUBCUTANEOUS
  Administered 2023-11-02: 1 [IU] via SUBCUTANEOUS
  Administered 2023-11-02: 2 [IU] via SUBCUTANEOUS
  Administered 2023-11-02: 3 [IU] via SUBCUTANEOUS
  Administered 2023-11-03 (×3): 2 [IU] via SUBCUTANEOUS

## 2023-10-30 MED ORDER — INSULIN REGULAR(HUMAN) IN NACL 100-0.9 UT/100ML-% IV SOLN
INTRAVENOUS | Status: DC
  Administered 2023-10-30: 8 [IU]/h via INTRAVENOUS
  Filled 2023-10-30: qty 100

## 2023-10-30 MED ORDER — DULOXETINE HCL 30 MG PO CPEP
30.0000 mg | ORAL_CAPSULE | Freq: Every day | ORAL | Status: DC
  Administered 2023-10-31 – 2023-11-03 (×4): 30 mg via ORAL
  Filled 2023-10-30 (×4): qty 1

## 2023-10-30 MED ORDER — LACTATED RINGERS IV SOLN
INTRAVENOUS | Status: DC
Start: 2023-10-30 — End: 2023-10-30

## 2023-10-30 MED ORDER — INSULIN REGULAR(HUMAN) IN NACL 100-0.9 UT/100ML-% IV SOLN: INTRAVENOUS | Status: DC

## 2023-10-30 MED ORDER — DEXTROSE IN LACTATED RINGERS 5 % IV SOLN: INTRAVENOUS | Status: DC

## 2023-10-30 MED ORDER — SEVELAMER CARBONATE 800 MG PO TABS
1600.0000 mg | ORAL_TABLET | Freq: Three times a day (TID) | ORAL | Status: DC
  Administered 2023-10-31 – 2023-11-02 (×9): 1600 mg via ORAL
  Filled 2023-10-30 (×10): qty 2

## 2023-10-30 MED ORDER — ROSUVASTATIN CALCIUM 10 MG PO TABS
10.0000 mg | ORAL_TABLET | Freq: Every evening | ORAL | Status: DC
  Administered 2023-10-30 – 2023-11-02 (×4): 10 mg via ORAL
  Filled 2023-10-30 (×4): qty 1

## 2023-10-30 MED ORDER — CHLORHEXIDINE GLUCONATE CLOTH 2 % EX PADS
6.0000 | MEDICATED_PAD | Freq: Every day | CUTANEOUS | Status: DC
  Administered 2023-10-31 – 2023-11-03 (×4): 6 via TOPICAL

## 2023-10-30 MED ORDER — LEVOTHYROXINE SODIUM 125 MCG PO TABS
175.0000 ug | ORAL_TABLET | Freq: Every day | ORAL | Status: DC
  Administered 2023-10-31 – 2023-11-03 (×4): 175 ug via ORAL
  Filled 2023-10-30 (×4): qty 1

## 2023-10-30 MED ORDER — CARVEDILOL 12.5 MG PO TABS
12.5000 mg | ORAL_TABLET | Freq: Two times a day (BID) | ORAL | Status: DC
  Administered 2023-10-31 – 2023-11-02 (×6): 12.5 mg via ORAL
  Filled 2023-10-30 (×7): qty 1

## 2023-10-30 MED ORDER — INSULIN GLARGINE-YFGN 100 UNIT/ML ~~LOC~~ SOLN
15.0000 [IU] | Freq: Every day | SUBCUTANEOUS | Status: DC
  Administered 2023-10-30: 15 [IU] via SUBCUTANEOUS
  Filled 2023-10-30 (×3): qty 0.15

## 2023-10-30 MED ORDER — MELATONIN 3 MG PO TABS
9.0000 mg | ORAL_TABLET | Freq: Every day | ORAL | Status: DC
  Administered 2023-10-30 – 2023-11-02 (×4): 9 mg via ORAL
  Filled 2023-10-30 (×4): qty 3

## 2023-10-30 MED ORDER — BUPROPION HCL ER (XL) 150 MG PO TB24
150.0000 mg | ORAL_TABLET | Freq: Every day | ORAL | Status: DC
Start: 2023-10-31 — End: 2023-11-03
  Administered 2023-10-31 – 2023-11-03 (×4): 150 mg via ORAL
  Filled 2023-10-30 (×4): qty 1

## 2023-10-30 MED ORDER — HEPARIN (PORCINE) 25000 UT/250ML-% IV SOLN
1450.0000 [IU]/h | INTRAVENOUS | Status: DC
  Administered 2023-10-31: 1300 [IU]/h via INTRAVENOUS
  Administered 2023-10-31 – 2023-11-02 (×3): 1450 [IU]/h via INTRAVENOUS
  Filled 2023-10-30 (×4): qty 250

## 2023-10-30 MED ORDER — CHLORHEXIDINE GLUCONATE CLOTH 2 % EX PADS
6.0000 | MEDICATED_PAD | Freq: Every day | CUTANEOUS | Status: DC
  Administered 2023-11-03: 6 via TOPICAL

## 2023-10-30 MED ORDER — LACTATED RINGERS IV BOLUS: 20.0000 mL/kg | Freq: Once | INTRAVENOUS | Status: DC

## 2023-10-30 MED ORDER — ACETAMINOPHEN 500 MG PO TABS
1000.0000 mg | ORAL_TABLET | Freq: Four times a day (QID) | ORAL | Status: DC | PRN
  Administered 2023-10-31 – 2023-11-02 (×2): 1000 mg via ORAL
  Filled 2023-10-30 (×2): qty 2

## 2023-10-30 MED ORDER — ASPIRIN 81 MG PO TBEC
81.0000 mg | DELAYED_RELEASE_TABLET | Freq: Every day | ORAL | Status: DC
  Administered 2023-10-30 – 2023-11-03 (×4): 81 mg via ORAL
  Filled 2023-10-30 (×5): qty 1

## 2023-10-30 MED ORDER — LAMOTRIGINE 100 MG PO TABS
200.0000 mg | ORAL_TABLET | Freq: Every evening | ORAL | Status: DC
  Administered 2023-10-30 – 2023-11-02 (×4): 200 mg via ORAL
  Filled 2023-10-30 (×4): qty 2

## 2023-10-30 MED ORDER — OXYCODONE HCL 5 MG PO TABS: 5.0000 mg | ORAL_TABLET | Freq: Once | ORAL | Status: DC

## 2023-10-30 NOTE — ED Notes (Signed)
 Pt desat to 86%, placed on 2L [place] saturation improved to 94%, pt reports O2 normally desats when she is lying back or down and sometimes when she sleeps

## 2023-10-30 NOTE — Assessment & Plan Note (Addendum)
 Blood sugar 983, serum bicarb 20, BHB 3.88.  VBG shows pH of 7.34.  Sodium- 120, anion gap 18. Patient's insulin  pump not working.  Hemoglobin A1c 08/02/2023- 6. - Continue insulin  drip -1 L bolus given, hold off on further fluids -Transition to subcu insulin  and long-acting insulin  when blood sugar is less than 250 - Diabetes coordinator consult, may need new insulin  pump - Liver enzymes mildly elevated-ALP and T. bili, trend for now - Addendum-blood sugars 219, last BMP with anion gap of 14, serum bicarb of 25.  Stop insulin  drip, start SSI- S, Lantus  15 units daily, allow diet.

## 2023-10-30 NOTE — ED Notes (Signed)
 Pt removed insulin pump

## 2023-10-30 NOTE — Assessment & Plan Note (Addendum)
 Resume Lamictal , Cymbalta , bupropion 

## 2023-10-30 NOTE — Assessment & Plan Note (Signed)
 Sodium 119, corrected for hyperglycemia sodium is 141. - Trend

## 2023-10-30 NOTE — H&P (Addendum)
 History and Physical    Jasmine [name]:[id number] DOB: [birth year] DOA: 10/30/2023  PCP: Lory Rough., PA-C   Patient coming from: Home  I have personally briefly reviewed patient's old medical records in [facility]  Chief Complaint: High blood sugars  HPI: Jasmine [name] is a 53 y.o. female with medical history significant for ESRD on dialysis Tuesday Thursday Saturday, coronary artery disease, CHF with reduced EF, diabetes mellitus, OSA on CPAP, bipolar disorder. Patient presented to the ED with reports of high blood sugars.  Patient reports she checked her blood sugars this morning and it was in the 400s, and then she realized that her insulin  pump was not working.  Later blood sugar read high.  No vomiting no diarrhea and no abdominal pain.  She was prescribed steroids by her orthopedist for hip and back pain earlier this month but she did not take them.   Reports chest pain this morning that lasted about 2 hours, midsternal, described as heaviness, spontaneously resolved.  She denies prior chest pains. Also reports difficulty breathing over the past few days that was worse today.  No weight gain, no lower extremity swelling.  Has not missed any dialysis sessions.  Her last dialysis was yesterday Saturday.  She still makes urine and takes her torsemide .  She is on 3 L O2 at night only with her CPAP.  Recent hospitalization 3/31 to 4/6 for sepsis secondary to MRSA bacteremia, MRSA right thigh abscess with I&D done.  Underwent TEE- 4/3 which was negative for vegetations.  ED Course: O2 sats down to 86 percent on room air, placed on 2 L.  Heart rate 70s.  Respiratory rate 14-23.  Blood pressure systolic 123-157. Blood glucose 983, serum bicarb 20.  BHB 3.88.  VBG shows pH of 7.34. Due to problems with the lab, sodium is still pending, hence anion gap not calculated.  I-STAT shows sodium of 119. Two-view chest x-ray shows vascular congestion question trace edema. Insulin   drip started, 1 L LR bolus given. EDP talked to nephrology, will see in consult for dialysis.  Review of Systems: As per HPI all other systems reviewed and negative.  Past Medical History:  Diagnosis Date   Anemia    Anxiety    Arthritis    Asthma    Balance problems    Bipolar disorder (HCC)    Charcot ankle    Chronic fatigue    Coronary artery disease    Depression    Diabetes mellitus    DKA, type 1 (HCC) 11/04/2011   Dysrhythmia    Elevated cholesterol    ESRD on hemodialysis (HCC)    TTS at [facility]   Fibromyalgia    GERD (gastroesophageal reflux disease)    Headache    History of suicidal ideation    Hyperlipemia    Hypertension    Hypothyroidism    IBS (irritable bowel syndrome)    Memory changes    Obesity    Sleep apnea    wears cpap   Stress incontinence    Pt had surgery to correct this.   Tachycardia    Tobacco abuse    Tremor    UTI (lower urinary tract infection)     Past Surgical History:  Procedure Laterality Date   AV FISTULA PLACEMENT Left 10/14/2023   Procedure: BRACHIOBASILIC ARTERIOVENOUS (AV) FISTULA CREATION LEFT;  Surgeon: Margherita Shell, MD;  Location: [facility] OR;  Service: Vascular;  Laterality: Left;   CORONARY IMAGING/OCT N/A 08/30/2023  Procedure: CORONARY IMAGING/OCT;  Surgeon: Kyra Phy, MD;  Location: [facility] INVASIVE CV LAB;  Service: Cardiovascular;  Laterality: N/A;   CORONARY STENT INTERVENTION N/A 08/30/2023   Procedure: CORONARY STENT INTERVENTION;  Surgeon: Kyra Phy, MD;  Location: [facility] INVASIVE CV LAB;  Service: Cardiovascular;  Laterality: N/A;   INCONTINENCE SURGERY     IR FLUORO GUIDE CV LINE LEFT  09/10/2023   IR US  GUIDE VASC ACCESS LEFT  09/10/2023   LEFT HEART CATH AND CORONARY ANGIOGRAPHY N/A 07/15/2023   Procedure: LEFT HEART CATH AND CORONARY ANGIOGRAPHY;  Surgeon: Kyra Phy, MD;  Location: [facility] INVASIVE CV LAB;  Service: Cardiovascular;  Laterality: N/A;   LEFT HEART CATH AND CORONARY ANGIOGRAPHY N/A  08/30/2023   Procedure: LEFT HEART CATH AND CORONARY ANGIOGRAPHY;  Surgeon: Kyra Phy, MD;  Location: [facility] INVASIVE CV LAB;  Service: Cardiovascular;  Laterality: N/A;   NASAL FRACTURE SURGERY     ovary removed     OVARY SURGERY     PUBOVAGINAL SLING  08/16/2011   Procedure: Gino Lais;  Surgeon: Livingston Rigg, MD;  Location: [facility] ORS;  Service: Urology;  Laterality: N/A;          TEE WITHOUT CARDIOVERSION N/A 09/08/2023   Procedure: ECHOCARDIOGRAM, TRANSESOPHAGEAL;  Surgeon: Laurann Pollock, MD;  Location: [facility] ORS;  Service: Endoscopy;  Laterality: N/A;   TUNNELLED CATHETER EXCHANGE N/A 09/30/2023   Procedure: TUNNELLED CATHETER EXCHANGE;  Surgeon: Patrick Boor, MD;  Location: [facility] INVASIVE CV LAB;  Service: Cardiovascular;  Laterality: N/A;   TUNNELLED CATHETER EXCHANGE N/A 10/07/2023   Procedure: TUNNELLED CATHETER EXCHANGE;  Surgeon: Melodie Spry, MD;  Location: [facility] INVASIVE CV LAB;  Service: Cardiovascular;  Laterality: N/A;   UTERINE FIBROID SURGERY  2001     reports that she quit smoking about 12 years ago. Her smoking use included cigarettes. She started smoking about 32 years ago. She has a 15 pack-year smoking history. She has never used smokeless tobacco. She reports that she does not drink alcohol and does not use drugs.  Allergies  Allergen Reactions   Ciprofloxacin  Swelling and Other (See Comments)    Per pt caused lips swell and nauseous feeling   Levaquin [Levofloxacin] Swelling and Other (See Comments)    Per pt caused lips swell and nauseous feeling   Linaclotide Other (See Comments)    Cause severe dehydration   Promethazine  Other (See Comments)    Completely wipes out/fatigue   Buspar [Buspirone] Other (See Comments)    abd cramping   Advair Diskus [Fluticasone -Salmeterol] Other (See Comments)    Thrush    Biaxin [Clarithromycin] Rash   Hydroxyzine Palpitations    Family History  Problem Relation Age of Onset   Asthma Mother    Bipolar disorder  Mother    Heart disease Father    Lymphoma Father    Hypertension Father    Thyroid  disease Father    Hyperlipidemia Father    Diabetes Father    Cancer Paternal Grandmother        lung and breast   Bladder Cancer Paternal Grandfather    Suicidality Maternal Grandfather    Thyroid  disease Brother     Prior to Admission medications   Medication Sig Start Date End Date Taking? Authorizing Provider  acetaminophen  (TYLENOL ) 500 MG tablet Take 500-1,000 mg by mouth every 6 (six) hours as needed (pain.).   Yes [provider]  aspirin  EC 81 MG tablet Take 1 tablet (81 mg total) by  mouth daily with breakfast. Swallow whole. 09/09/23  Yes Naziyah Tieszen, Courage, MD  azelastine  (ASTELIN ) 0.1 % nasal spray Place 2 sprays into both nostrils 2 (two) times daily. Use in each nostril as directed Patient taking differently: Place 2 sprays into both nostrils daily as needed for allergies or rhinitis. 08/01/23  Yes Antonio Baumgarten, NP  buPROPion  (WELLBUTRIN  XL) 150 MG 24 hr tablet Take 1 tablet (150 mg total) by mouth daily. 10/28/23 02/25/24 Yes Todd Fossa, MD  carvedilol  (COREG ) 12.5 MG tablet Take 12.5 mg by mouth 2 (two) times daily with a meal. 01/17/23 01/17/24 Yes [provider]  Cholecalciferol  (VITAMIN D -3 PO) Take 1 tablet by mouth in the morning.   Yes [provider]  clopidogrel  (PLAVIX ) 75 MG tablet Take 1 tablet (75 mg total) by mouth daily. 02/22/23  Yes Clearnce Curia, NP  diclofenac  Sodium (VOLTAREN ) 1 % GEL Apply 2 g topically 4 (four) times daily as needed (pain.).   Yes [provider]  DULoxetine  (CYMBALTA ) 30 MG capsule Take 1 capsule (30 mg total) by mouth daily. 10/11/23 02/08/24 Yes Todd Fossa, MD  folic acid  (FOLVITE ) 800 MCG tablet Take 800 mcg by mouth in the morning.   Yes [provider]  hydrALAZINE  (APRESOLINE ) 25 MG tablet Take 25 mg by mouth 2 (two) times daily. 05/03/23  Yes [provider]  HYDROcodone -acetaminophen   (NORCO/VICODIN) 5-325 MG tablet Take 1 tablet by mouth every 6 (six) hours as needed for moderate pain (pain score 4-6). 10/14/23  Yes Cordie Deters, PA-C  insulin  detemir (LEVEMIR ) 100 UNIT/ML injection Inject 27 Units into the skin 2 (two) times daily as needed (if insulin  pump inoperable). 04/04/23  Yes [provider]  lamoTRIgine  (LAMICTAL ) 200 MG tablet Take 1 tablet (200 mg total) by mouth every evening. 10/21/23 04/18/24 Yes Todd Fossa, MD  levothyroxine  (SYNTHROID ) 175 MCG tablet Take 1.5 tablets (mcg) by mouth on Sundays in the mornings & take 1 tablet (175 mcg) by mouth on all other days. 11/20/21  Yes [provider]  LORazepam  (ATIVAN ) 0.5 MG tablet Take 1 tablet (0.5 mg total) by mouth daily as needed for anxiety. 11/11/23 12/11/23 Yes Todd Fossa, MD  Melatonin 10 MG TABS Take 10 mg by mouth at bedtime.   Yes [provider]  NOVOLOG  100 UNIT/ML injection Inject into the skin continuous. Sliding Scale Insulin  pump 2.5 units basal Bolus with meal depending on the size 03/15/22  Yes [provider]  ondansetron  (ZOFRAN -ODT) 4 MG disintegrating tablet Take 4 mg by mouth every 8 (eight) hours as needed for nausea or vomiting. 03/17/22  Yes [provider]  OXYGEN Inhale 3 L into the lungs at bedtime.   Yes [provider]  rizatriptan  (MAXALT ) 10 MG tablet Take 10 mg by mouth as needed for migraine. May repeat in 2 hours if needed   Yes [provider]  rosuvastatin  (CRESTOR ) 10 MG tablet Take 1 tablet (10 mg total) by mouth daily. Patient taking differently: Take 10 mg by mouth every evening. 07/29/23 10/30/23 Yes Sheryle Donning, MD  sevelamer  carbonate (RENVELA ) 800 MG tablet Take 1,600 mg by mouth 3 (three) times daily with meals. 07/28/23  Yes [provider]  torsemide  (DEMADEX ) 20 MG tablet Take 40 mg by mouth every Monday, Wednesday, and Friday.   Yes [provider]  Continuous Blood Gluc Sensor  MISC 1 each by Does not apply route as directed. Use as directed every 14 days. May dispense FreeStyle [employer]  or similar.    [provider]  glucagon (GLUCAGEN  HYPOKIT) 1 MG SOLR injection Inject 1 mg into the skin once as needed for up to 1 dose for low blood sugar. GlucaGen  HypoKit 1 mg Injection 03/31/23   Johnson, Clanford L, MD  predniSONE (DELTASONE) 10 MG tablet Take 10 mg by mouth See admin instructions. TAKE 6 TABS ON DAY 1, 5 TABS ON DAY 2, 4 TABS ON DAY 3, 3 TABS ON DAY 4, 2 TABS ON DAY 5 AND 1 TAB ON DAY 6. Patient not taking: Reported on 10/21/2023 10/10/23   [provider]  PRESCRIPTION MEDICATION See admin instructions.  IDPN LV CUSTOM (100GM)   INFUSE 1 BAG DURING HD 3 TIMES WEEKLY AS FOLLOWS 1ST WEEK 55ML/HR; 2ND WEEK 110ML/HR; 3RD WEEK 163ML/HR; THEN CONTINUE WITH GOAL RATE= 163ML/HR OVER 3HRS . TITRATE AS TOLERATED. ADMINISTER IV VIA VENOUS DRIP CHAMBER.    [provider]    Physical Exam: Vitals:   10/30/23 1400 10/30/23 1415 10/30/23 1418 10/30/23 1445  BP: (!) 134/47 (!) 125/49  (!) 123/43  Pulse: 72 72 71 73  Resp: 14 (!) 23 (!) 22 20  Temp:      TempSrc:      SpO2: 93% 91% (!) 87% 97%  Weight:      Height:        Constitutional: NAD, calm, comfortable Vitals:   10/30/23 1400 10/30/23 1415 10/30/23 1418 10/30/23 1445  BP: (!) 134/47 (!) 125/49  (!) 123/43  Pulse: 72 72 71 73  Resp: 14 (!) 23 (!) 22 20  Temp:      TempSrc:      SpO2: 93% 91% (!) 87% 97%  Weight:      Height:       Eyes: PERRL, lids and conjunctivae normal ENMT: Mucous membranes are moist.  Neck: normal, supple, no masses, no thyromegaly Respiratory:  Normal respiratory effort. No accessory muscle use.  Cardiovascular: Regular rate and rhythm, no murmurs / rubs / gallops. No extremity edema.  Extremities warm Abdomen: no tenderness, no masses palpated. No hepatosplenomegaly. Bowel sounds positive.  Musculoskeletal: no clubbing / cyanosis.  No joint deformity upper and lower extremities.  Skin: no rashes, lesions, ulcers. No induration Neurologic: No facial asymmetry, moving extremities spontaneously, speech fluent Psychiatric: Normal judgment and insight. Alert and oriented x 3. Normal mood.   Labs on Admission: I have personally reviewed following labs and imaging studies  CBC: Recent Labs  Lab 10/30/23 1318 10/30/23 1351  WBC 5.6  --   HGB 8.8* 9.9*  HCT 28.8* 29.0*  MCV 97.6  --   PLT 190  --    Basic Metabolic Panel: Recent Labs  Lab 10/30/23 1333 10/30/23 1351  NA PENDING 119*  K 5.1 5.7*  CL 82* 85*  CO2 20*  --   GLUCOSE 983* >700*  BUN 29* 40*  CREATININE 3.08* 3.00*  CALCIUM  8.7*  --    GFR: Estimated Creatinine Clearance: 30.3 mL/min (A) (by C-G formula based on SCr of 3 mg/dL (H)). Liver Function Tests: Recent Labs  Lab 10/30/23 1333  AST 21  ALT 13  ALKPHOS 134*  BILITOT 2.0*  PROT 6.7  ALBUMIN 3.3*   CBG: Recent Labs  Lab 10/30/23 1319 10/30/23 1423  GLUCAP >600* >600*   Urine analysis:    Component Value Date/Time   COLORURINE STRAW (A) 10/30/2023 1540   APPEARANCEUR CLEAR 10/30/2023 1540   LABSPEC 1.011 10/30/2023 1540   PHURINE 7.0 10/30/2023 1540  GLUCOSEU >=500 (A) 10/30/2023 1540   HGBUR NEGATIVE 10/30/2023 1540   BILIRUBINUR NEGATIVE 10/30/2023 1540   KETONESUR 20 (A) 10/30/2023 1540   PROTEINUR 100 (A) 10/30/2023 1540   UROBILINOGEN 0.2 03/19/2015 1525   NITRITE NEGATIVE 10/30/2023 1540   LEUKOCYTESUR NEGATIVE 10/30/2023 1540    Radiological Exams on Admission: DG Chest 2 View Result Date: 10/30/2023 CLINICAL DATA:  Shortness of breath EXAM: CHEST - 2 VIEW COMPARISON:  X-ray 09/10/2023.  Older exams as well FINDINGS: Underinflation. Left IJ double-lumen catheter with tip overlying the right atrium. Stable cardiopericardial silhouette with some vascular congestion. Question trace edema. No pneumothorax or effusion. No consolidation. Overlapping cardiac leads.  Films are under penetrated. IMPRESSION: Underinflation. Vascular congestion. Question trace edema. Double-lumen left IJ catheter. Electronically Signed   By: Adrianna Horde M.D.   On: 10/30/2023 14:26    EKG: Independently reviewed.  sinus rhythm rate - 74, QTc 455.  Artifact present in lead V5.  Assessment/Plan Principal Problem:   DKA (diabetic ketoacidosis) (HCC) Active Problems:   Chest pain   Essential hypertension   OSA on CPAP   Bipolar 2 disorder, major depressive episode (HCC)   Chronic respiratory failure with hypoxia (HCC)   CAD (coronary artery disease)   End-stage renal disease on hemodialysis (HCC)   Chronic HFrEF (heart failure with reduced ejection fraction) (HCC)   Assessment and Plan: * DKA (diabetic ketoacidosis) (HCC) Blood sugar 983, serum bicarb 20, BHB 3.88.  VBG shows pH of 7.34.  Sodium- 120, anion gap 18. Patient's insulin  pump not working.  Hemoglobin A1c 08/02/2023- 6. - Continue insulin  drip -1 L bolus given, hold off on further fluids -Transition to subcu insulin  and long-acting insulin  when blood sugar is less than 250 - Diabetes coordinator consult, may need new insulin  pump - Liver enzymes mildly elevated-ALP and T. bili, trend for now - Addendum-blood sugars 219, last BMP with anion gap of 14, serum bicarb of 25.  Stop insulin  drip, start SSI- S, Lantus  15 units daily, allow diet.  Chest pain Hx of CAD, with recent DES X 4 - 08/30/23. Reports chest pain 2 hours duration today-resolved spontaneously, with dyspnea .  EKG today without significant changes.  Troponin 476>> 579.  Recent TEE- 3/25 with EF of 60 to 65%.  Chest pain in the setting of DKA. - Troponin still uptrending, will start heparin  drip -Resume aspirin , carvedilol , Plavix , Crestor   Pseudohyponatremia Sodium 119, corrected for hyperglycemia sodium is 141. - Trend  Essential hypertension Stable. -Resume carvedilol , hydralazine , torsemide   End-stage renal disease on hemodialysis  [facility]) On dialysis Tuesday Thursday Saturday, last dialysis session was yesterday Saturday.  Chest x-ray showing vascular congestion, 1 L LR bolus given.  Reports compliance with dialysis sessions. - EDP talked to nephrology, will see in consult in a.m. - hold off on further fluids. -Resume Renvela  - Still makes a good amount of urine and is on torsemide  on non-HD days, resume  Chronic respiratory failure with hypoxia (HCC) Chronic hypoxic respiratory failure + OSA on CPAP- on 3 L O2 at night only.  Bipolar 2 disorder, major depressive episode (HCC) Resume Lamictal , Cymbalta , bupropion    DVT prophylaxis: Heparin  Code Status: FULL Code Family Communication: Spouse at bedside Disposition Plan: > 2 days Consults called: Nephrology Admission status: Inpt Stepdown I certify that at the point of admission it is my clinical judgment that the patient will require inpatient hospital care spanning beyond 2 midnights from the point of admission due to high intensity of service, high risk  for further deterioration and high frequency of surveillance required.   CRITICAL CARE Performed by: Pati Bonine   Total critical care time: 70 minutes  Critical care time was exclusive of separately billable procedures and treating other patients.  Critical care was necessary to treat or prevent imminent or life-threatening deterioration.  Critical care was time spent personally by me on the following activities: development of treatment plan with patient and/or surrogate as well as nursing, discussions with consultants, evaluation of patient's response to treatment, examination of patient, obtaining history from patient or surrogate, ordering and performing treatments and interventions, ordering and review of laboratory studies, ordering and review of radiographic studies, pulse oximetry and re-evaluation of patient's condition.   Author: Pati Bonine, MD 10/30/2023 10:49 PM  For on call  review [email or web address].

## 2023-10-30 NOTE — Assessment & Plan Note (Signed)
 Stable. -Resume carvedilol , hydralazine , torsemide 

## 2023-10-30 NOTE — Plan of Care (Signed)

## 2023-10-30 NOTE — Assessment & Plan Note (Signed)
 Chronic hypoxic respiratory failure + OSA on CPAP- on 3 L O2 at night only.

## 2023-10-30 NOTE — Assessment & Plan Note (Addendum)
 On dialysis Tuesday Thursday Saturday, last dialysis session was yesterday Saturday.  Chest x-ray showing vascular congestion, 1 L LR bolus given.  Reports compliance with dialysis sessions. - EDP talked to nephrology, will see in consult in a.m. - hold off on further fluids. -Resume Renvela  - Still makes a good amount of urine and is on torsemide  on non-HD days, resume

## 2023-10-30 NOTE — ED Triage Notes (Signed)
 Pt with SOB for past few hours. Denies cough.   Also noted her blood sugar "High" since early this morning. + chest heaviness

## 2023-10-30 NOTE — Progress Notes (Addendum)
 PHARMACY - ANTICOAGULATION CONSULT NOTE  Pharmacy Consult for heparin  Indication: chest pain/ACS  Allergies  Allergen Reactions   Ciprofloxacin  Swelling and Other (See Comments)    Per pt caused lips swell and nauseous feeling   Levaquin [Levofloxacin] Swelling and Other (See Comments)    Per pt caused lips swell and nauseous feeling   Linaclotide Other (See Comments)    Cause severe dehydration   Promethazine  Other (See Comments)    Completely wipes out/fatigue   Buspar [Buspirone] Other (See Comments)    abd cramping   Advair Diskus [Fluticasone -Salmeterol] Other (See Comments)    Thrush    Biaxin [Clarithromycin] Rash   Hydroxyzine Palpitations    Patient Measurements: Height: 5\' 9"  (175.3 cm) Weight: 116.6 kg (257 lb) IBW/kg (Calculated) : 66.2 HEPARIN  DW (KG): 92.9  Vital Signs: Temp: 98.3 F (36.8 C) (05/25 1635) Temp Source: Oral (05/25 1635) BP: 156/50 (05/25 1900) Pulse Rate: 76 (05/25 1900)  Labs: Recent Labs    10/30/23 1318 10/30/23 1333 10/30/23 1351 10/30/23 1516 10/30/23 1731 10/30/23 2007  HGB 8.8*  --  9.9*  --   --   --   HCT 28.8*  --  29.0*  --   --   --   PLT 190  --   --   --   --   --   CREATININE  --  3.08* 3.00*  --  3.39*  --   TROPONINIHS 476*  --   --  579*  --  739*    Estimated Creatinine Clearance: 26.8 mL/min (A) (by C-G formula based on SCr of 3.39 mg/dL (H)).   Medical History: Past Medical History:  Diagnosis Date   Anemia    Anxiety    Arthritis    Asthma    Balance problems    Bipolar disorder (HCC)    Charcot ankle    Chronic fatigue    Coronary artery disease    Depression    Diabetes mellitus    DKA, type 1 (HCC) 11/04/2011   Dysrhythmia    Elevated cholesterol    ESRD on hemodialysis (HCC)    TTS at [facility]   Fibromyalgia    GERD (gastroesophageal reflux disease)    Headache    History of suicidal ideation    Hyperlipemia    Hypertension    Hypothyroidism    IBS (irritable bowel syndrome)     Memory changes    Obesity    Sleep apnea    wears cpap   Stress incontinence    Pt had surgery to correct this.   Tachycardia    Tobacco abuse    Tremor    UTI (lower urinary tract infection)     Assessment: 53yo female c/o CP and SOB, troponins elevated and rising >> to begin heparin .  Goal of Therapy:  Heparin  level 0.3-0.7 units/ml Monitor platelets by anticoagulation protocol: Yes   Plan:  Rec'd SQ UFH <1h ago so will forego heparin  bolus. Heparin  infusion at 1300 units/hr. Monitor heparin  levels and CBC.  Lonnie Roberts, PharmD, BCPS  10/30/2023,11:12 PM

## 2023-10-30 NOTE — ED Provider Notes (Signed)
 [place] EMERGENCY DEPARTMENT AT [facility] Provider Note   CSN: [id number] Arrival date & time: 10/30/23  1307     History  Chief Complaint  Patient presents with   Hyperglycemia   Shortness of Breath    Jasmine [name] is a 53 y.o. female.  Patient is a 53 year old female who presents to the emergency department with a chief complaint of shortness of breath, elevated blood sugar, generalized weakness, chest heaviness which has been ongoing since early this morning.  Patient notes that her insulin  pump malfunctioned throughout the night.  Patient notes that she has had no associated nausea, vomiting, diarrhea.  She denies any associated dysuria or hematuria.  She has had no associated numbness, paresthesias or unilateral weakness.  She denies any abnormal headache.  She is currently on dialysis on Tuesday, Thursday, Saturday and did receive dialysis yesterday.   Hyperglycemia Associated symptoms: shortness of breath   Shortness of Breath      Home Medications Prior to Admission medications   Medication Sig Start Date End Date Taking? Authorizing Provider  acetaminophen  (TYLENOL ) 500 MG tablet Take 500-1,000 mg by mouth every 6 (six) hours as needed (pain.).    [provider]  aspirin  EC 81 MG tablet Take 1 tablet (81 mg total) by mouth daily with breakfast. Swallow whole. 09/09/23   Colin Dawley, MD  azelastine  (ASTELIN ) 0.1 % nasal spray Place 2 sprays into both nostrils 2 (two) times daily. Use in each nostril as directed Patient taking differently: Place 2 sprays into both nostrils every other day. Use in each nostril as directed 08/01/23   Antonio Baumgarten, NP  buPROPion  (WELLBUTRIN  XL) 150 MG 24 hr tablet Take 1 tablet (150 mg total) by mouth daily. 10/28/23 02/25/24  Todd Fossa, MD  carvedilol  (COREG ) 12.5 MG tablet Take 12.5 mg by mouth 2 (two) times daily with a meal. 01/17/23 01/17/24  [provider]  Cholecalciferol  (VITAMIN D -3 PO)  Take 10,000 Units by mouth in the morning.    [provider]  clopidogrel  (PLAVIX ) 75 MG tablet Take 1 tablet (75 mg total) by mouth daily. 02/22/23   Clearnce Curia, NP  Continuous Blood Gluc Sensor MISC 1 each by Does not apply route as directed. Use as directed every 14 days. May dispense FreeStyle [employer] or similar.    [provider]  diclofenac  Sodium (VOLTAREN ) 1 % GEL Apply 2 g topically 4 (four) times daily as needed (pain.).    [provider]  dicyclomine  (BENTYL ) 10 MG capsule Take 10 mg by mouth 2 (two) times daily as needed for spasms. 12/25/22   [provider]  DULoxetine  (CYMBALTA ) 30 MG capsule Take 1 capsule (30 mg total) by mouth daily. 10/11/23 02/08/24  Todd Fossa, MD  folic acid  (FOLVITE ) 800 MCG tablet Take 800 mcg by mouth in the morning.    [provider]  glucagon (GLUCAGEN  HYPOKIT) 1 MG SOLR injection Inject 1 mg into the skin once as needed for up to 1 dose for low blood sugar. GlucaGen  HypoKit 1 mg Injection 03/31/23   Johnson, Clanford L, MD  hydrALAZINE  (APRESOLINE ) 25 MG tablet Take 25 mg by mouth 2 (two) times daily. 05/03/23   [provider]  HYDROcodone -acetaminophen  (NORCO/VICODIN) 5-325 MG tablet Take 1 tablet by mouth every 6 (six) hours as needed for moderate pain (pain score 4-6). 10/14/23   Cordie Deters, PA-C  insulin  detemir (LEVEMIR ) 100 UNIT/ML injection Inject 27 Units into the skin 2 (  two) times daily as needed (if insulin  pump inoperable). 04/04/23   [provider]  lamoTRIgine  (LAMICTAL ) 200 MG tablet Take 1 tablet (200 mg total) by mouth every evening. 10/21/23 04/18/24  Todd Fossa, MD  levothyroxine  (SYNTHROID ) 175 MCG tablet Take 1.5 tablets (mcg) by mouth on Sundays in the mornings & take 1 tablet (175 mcg) by mouth on all other days. 11/20/21   [provider]  LORazepam  (ATIVAN ) 0.5 MG tablet Take 1 tablet (0.5 mg total) by mouth daily as needed for anxiety.  11/11/23 12/11/23  Todd Fossa, MD  Melatonin 10 MG TABS Take 10 mg by mouth at bedtime.    [provider]  NOVOLOG  100 UNIT/ML injection Inject into the skin continuous. Sliding Scale Insulin  pump 2.5 units basal Bolus with meal depending on the size 03/15/22   [provider]  ondansetron  (ZOFRAN -ODT) 4 MG disintegrating tablet Take 4 mg by mouth every 8 (eight) hours as needed for nausea or vomiting. 03/17/22   [provider]  OVER THE COUNTER MEDICATION Apply 1 application  topically at bedtime as needed (Sleep). CBD oil    [provider]  OXYGEN Inhale 3 L into the lungs at bedtime.    [provider]  predniSONE (DELTASONE) 10 MG tablet Take 10 mg by mouth See admin instructions. TAKE 6 TABS ON DAY 1, 5 TABS ON DAY 2, 4 TABS ON DAY 3, 3 TABS ON DAY 4, 2 TABS ON DAY 5 AND 1 TAB ON DAY 6. Patient not taking: Reported on 10/21/2023 10/10/23   [provider]  PRESCRIPTION MEDICATION See admin instructions.  IDPN LV CUSTOM (100GM)   INFUSE 1 BAG DURING HD 3 TIMES WEEKLY AS FOLLOWS 1ST WEEK 55ML/HR; 2ND WEEK 110ML/HR; 3RD WEEK 163ML/HR; THEN CONTINUE WITH GOAL RATE= 163ML/HR OVER 3HRS . TITRATE AS TOLERATED. ADMINISTER IV VIA VENOUS DRIP CHAMBER.    [provider]  rizatriptan  (MAXALT ) 10 MG tablet Take 10 mg by mouth as needed for migraine. May repeat in 2 hours if needed    [provider]  rosuvastatin  (CRESTOR ) 10 MG tablet Take 1 tablet (10 mg total) by mouth daily. Patient taking differently: Take 10 mg by mouth every evening. 07/29/23 10/27/23  Sheryle Donning, MD  sevelamer  carbonate (RENVELA ) 800 MG tablet Take 1,600 mg by mouth 3 (three) times daily with meals. 07/28/23   [provider]  torsemide  (DEMADEX ) 20 MG tablet Take 40 mg by mouth every Monday, Wednesday, and Friday. Sunday    [provider]      Allergies    Ciprofloxacin , Levaquin [levofloxacin], Linaclotide,  Promethazine , Buspar [buspirone], Advair diskus [fluticasone -salmeterol], Biaxin [clarithromycin], and Hydroxyzine    Review of Systems   Review of Systems  Respiratory:  Positive for shortness of breath.   All other systems reviewed and are negative.   Physical Exam Updated Vital Signs BP (!) 123/43   Pulse 73   Temp 98 F (36.7 C) (Oral)   Resp 20   Ht 5\' 9"  (1.753 m)   Wt 116.6 kg   LMP 03/23/2017 (Approximate)   SpO2 97%   BMI 37.95 kg/m  Physical Exam Vitals and nursing note reviewed.  Constitutional:      General: She is not in acute distress.    Appearance: Normal appearance. She is not ill-appearing.  HENT:     Head: Normocephalic and atraumatic.     Nose: Nose normal.     Mouth/Throat:     Mouth: Mucous membranes are  moist.  Eyes:     Extraocular Movements: Extraocular movements intact.     Conjunctiva/sclera: Conjunctivae normal.     Pupils: Pupils are equal, round, and reactive to light.  Cardiovascular:     Rate and Rhythm: Normal rate and regular rhythm.     Pulses: Normal pulses.     Heart sounds: Normal heart sounds. No murmur heard. Pulmonary:     Effort: Pulmonary effort is normal. No tachypnea.     Breath sounds: No stridor. Rales present. No decreased breath sounds, wheezing or rhonchi.  Chest:     Chest wall: No tenderness.  Abdominal:     General: Abdomen is flat. Bowel sounds are normal.     Palpations: Abdomen is soft. There is no mass.     Tenderness: There is no abdominal tenderness. There is no guarding.  Musculoskeletal:        General: Normal range of motion.     Cervical back: Normal range of motion and neck supple.  Skin:    General: Skin is warm and dry.     Findings: No erythema or rash.  Neurological:     General: No focal deficit present.     Mental Status: She is alert and oriented to person, place, and time. Mental status is at baseline.     Cranial Nerves: No cranial nerve deficit.     Motor: No weakness.  Psychiatric:         Mood and Affect: Mood normal.        Behavior: Behavior normal.        Thought Content: Thought content normal.        Judgment: Judgment normal.     ED Results / Procedures / Treatments   Labs (all labs ordered are listed, but only abnormal results are displayed) Labs Reviewed  CBC - Abnormal; Notable for the following components:      Result Value   RBC 2.95 (*)    Hemoglobin 8.8 (*)    HCT 28.8 (*)    All other components within normal limits  COMPREHENSIVE METABOLIC PANEL WITH GFR - Abnormal; Notable for the following components:   Chloride 82 (*)    CO2 20 (*)    Glucose, Bld 983 (*)    BUN 29 (*)    Creatinine, Ser 3.08 (*)    Calcium  8.7 (*)    Albumin 3.3 (*)    Alkaline Phosphatase 134 (*)    Total Bilirubin 2.0 (*)    GFR, Estimated 18 (*)    All other components within normal limits  BLOOD GAS, VENOUS - Abnormal; Notable for the following components:   pCO2, Ven 39 (*)    pO2, Ven 31 (*)    Acid-base deficit 3.9 (*)    All other components within normal limits  BETA-HYDROXYBUTYRIC ACID - Abnormal; Notable for the following components:   Beta-Hydroxybutyric Acid 3.88 (*)    All other components within normal limits  CBG MONITORING, ED - Abnormal; Notable for the following components:   Glucose-Capillary >600 (*)    All other components within normal limits  I-STAT CHEM 8, ED - Abnormal; Notable for the following components:   Sodium 119 (*)    Potassium 5.7 (*)    Chloride 85 (*)    BUN 40 (*)    Creatinine, Ser 3.00 (*)    Glucose, Bld >700 (*)    Calcium , Ion 1.04 (*)    TCO2 21 (*)    Hemoglobin 9.9 (*)  HCT 29.0 (*)    All other components within normal limits  CBG MONITORING, ED - Abnormal; Notable for the following components:   Glucose-Capillary >600 (*)    All other components within normal limits  TROPONIN I (HIGH SENSITIVITY) - Abnormal; Notable for the following components:   Troponin I (High Sensitivity) 476 (*)    All other  components within normal limits  URINALYSIS, ROUTINE W REFLEX MICROSCOPIC  TROPONIN I (HIGH SENSITIVITY)    EKG EKG Interpretation Date/Time:  Sunday Oct 30 2023 13:25:02 EDT Ventricular Rate:  74 PR Interval:  198 QRS Duration:  104 QT Interval:  410 QTC Calculation: 455 R Axis:   19  Text Interpretation: Normal sinus rhythm Low voltage QRS Septal infarct , age undetermined Abnormal ECG When compared with ECG of 05-Sep-2023 14:04, PREVIOUS ECG IS PRESENT Confirmed by Early Glisson ([id number]) on 10/30/2023 1:53:07 PM  Radiology DG Chest 2 View Result Date: 10/30/2023 CLINICAL DATA:  Shortness of breath EXAM: CHEST - 2 VIEW COMPARISON:  X-ray 09/10/2023.  Older exams as well FINDINGS: Underinflation. Left IJ double-lumen catheter with tip overlying the right atrium. Stable cardiopericardial silhouette with some vascular congestion. Question trace edema. No pneumothorax or effusion. No consolidation. Overlapping cardiac leads. Films are under penetrated. IMPRESSION: Underinflation. Vascular congestion. Question trace edema. Double-lumen left IJ catheter. Electronically Signed   By: Adrianna Horde M.D.   On: 10/30/2023 14:26    Procedures .Critical Care  Performed by: Roselynn Connors, PA-C Authorized by: Roselynn Connors, PA-C   Critical care provider statement:    Critical care time (minutes):  35   Critical care was necessary to treat or prevent imminent or life-threatening deterioration of the following conditions: DKA, insulin  drip.   Critical care was time spent personally by me on the following activities:  Development of treatment plan with patient or surrogate, discussions with consultants, evaluation of patient's response to treatment, examination of patient, ordering and review of laboratory studies, ordering and review of radiographic studies, ordering and performing treatments and interventions, pulse oximetry, re-evaluation of patient's condition and review of old  charts   I assumed direction of critical care for this patient from another provider in my specialty: no     Care discussed with: admitting provider       Medications Ordered in ED Medications  insulin  regular, human (MYXREDLIN ) 100 units/ 100 mL infusion (8 Units/hr Intravenous New Bag/Given 10/30/23 1434)  dextrose  5 % in lactated ringers  infusion (has no administration in time range)  dextrose  50 % solution 0-50 mL (has no administration in time range)  lactated ringers  bolus 1,000 mL (1,000 mLs Intravenous New Bag/Given 10/30/23 1352)    ED Course/ Medical Decision Making/ A&P                                 Medical Decision Making Amount and/or Complexity of Data Reviewed Labs: ordered. Radiology: ordered.  Risk Prescription drug management. Decision regarding hospitalization.   This patient presents to the ED for concern of chest pain, shortness of breath, generalized malaise fatigue, elevated blood sugar, this involves an extensive number of treatment options, and is a complaint that carries with it a high risk of complications and morbidity.  The differential diagnosis includes DKA, HHS, acute kidney injury, electrolyte derangement, ACS, pulmonary embolus, pericarditis, myocarditis, pulmonary edema, pneumonia, urinary tract infection   Co morbidities that complicate the patient evaluation  Diabetic, dialysis dependent  Additional history obtained:  Additional history obtained from none External records from outside source obtained and reviewed including none   Lab Tests:  I Ordered, and personally interpreted labs.  The pertinent results include: No leukocytosis, associated anemia, elevated glucose, low bicarb, elevated anion gap, pseudohyponatremia, elevated bilirubin and alkaline phos, elevated troponin but at baseline, elevated beta hydroxybutyrate   Imaging Studies ordered:  I ordered imaging studies including chest x-ray I independently visualized and  interpreted imaging which showed vascular congestion I agree with the radiologist interpretation   Cardiac Monitoring: / EKG:  The patient was maintained on a cardiac monitor.  I personally viewed and interpreted the cardiac monitored which showed an underlying rhythm of: Normal sinus rhythm, no ST/T wave changes, no ischemic changes, no STEMI   Consultations Obtained:  I requested consultation with the nephrology and hospitalist,  and discussed lab and imaging findings as well as pertinent plan - they recommend: Admission   Problem List / ED Course / Critical interventions / Medication management  Patient does remain stable at this time.  Discussed with patient we will plan for admission to the hospital service given her apparent DKA.  She has been placed on an insulin  drip at this time.  Did stop IV fluids as patient already has developing pulmonary edema.  She has no signs of any acute respiratory distress at this point I do not suspect that she warrants emergent dialysis.  Did discuss patient case with nephrology so that patient can have dialysis while in the hospital.  Did discuss patient case with Dr. Quintella Buck with the hospitalist service who has excepted for admission at this time.  Patient has no obvious infectious source at this time.  Abdominal exam is benign with no focal tenderness throughout. I ordered medication including insulin  drip for DKA Reevaluation of the patient after these medicines showed that the patient improved I have reviewed the patients home medicines and have made adjustments as needed   Social Determinants of Health:  None   Test / Admission - Considered:  Admission        Final Clinical Impression(s) / ED Diagnoses Final diagnoses:  None    Rx / DC Orders ED Discharge Orders     None         Emmalene Hare 10/30/23 1744    Early Glisson, MD 10/30/23 1819

## 2023-10-30 NOTE — Progress Notes (Signed)
 Date and time results received: 10/30/23 1800 (use smartphrase ".now" to insert current time)  Test: Glucose, blood Critical Value: 706  Name of Provider Notified: Dr. Quintella Buck  Orders Received? Or Actions Taken?:

## 2023-10-30 NOTE — Assessment & Plan Note (Addendum)
 Hx of CAD, with recent DES X 4 - 08/30/23. Reports chest pain 2 hours duration today-resolved spontaneously, with dyspnea .  EKG today without significant changes.  Troponin 476>> 579.  Recent TEE- 3/25 with EF of 60 to 65%.  Chest pain in the setting of DKA. - Troponin still uptrending, will start heparin  drip -Resume aspirin , carvedilol , Plavix , Crestor 

## 2023-10-31 ENCOUNTER — Inpatient Hospital Stay (HOSPITAL_COMMUNITY)

## 2023-10-31 ENCOUNTER — Other Ambulatory Visit (HOSPITAL_COMMUNITY): Payer: Self-pay | Admitting: *Deleted

## 2023-10-31 DIAGNOSIS — R7989 Other specified abnormal findings of blood chemistry: Secondary | ICD-10-CM

## 2023-10-31 DIAGNOSIS — N186 End stage renal disease: Secondary | ICD-10-CM | POA: Diagnosis not present

## 2023-10-31 DIAGNOSIS — I5022 Chronic systolic (congestive) heart failure: Secondary | ICD-10-CM

## 2023-10-31 DIAGNOSIS — E0810 Diabetes mellitus due to underlying condition with ketoacidosis without coma: Secondary | ICD-10-CM

## 2023-10-31 DIAGNOSIS — G4733 Obstructive sleep apnea (adult) (pediatric): Secondary | ICD-10-CM

## 2023-10-31 DIAGNOSIS — J9611 Chronic respiratory failure with hypoxia: Secondary | ICD-10-CM | POA: Diagnosis not present

## 2023-10-31 DIAGNOSIS — F3181 Bipolar II disorder: Secondary | ICD-10-CM

## 2023-10-31 DIAGNOSIS — I25118 Atherosclerotic heart disease of native coronary artery with other forms of angina pectoris: Secondary | ICD-10-CM

## 2023-10-31 DIAGNOSIS — R079 Chest pain, unspecified: Secondary | ICD-10-CM | POA: Diagnosis not present

## 2023-10-31 DIAGNOSIS — Z992 Dependence on renal dialysis: Secondary | ICD-10-CM

## 2023-10-31 LAB — ECHOCARDIOGRAM COMPLETE
AR max vel: 2 cm2
AV Area VTI: 1.69 cm2
AV Area mean vel: 1.82 cm2
AV Mean grad: 6 mmHg
AV Peak grad: 14 mmHg
Ao pk vel: 1.87 m/s
Area-P 1/2: 4.6 cm2
Est EF: 55
Height: 69 in
MV M vel: 6.58 m/s
MV Peak grad: 173.2 mmHg
S' Lateral: 3 cm
Weight: 4112 [oz_av]

## 2023-10-31 LAB — CBC
HCT: 26.8 % — ABNORMAL LOW (ref 36.0–46.0)
Hemoglobin: 9.1 g/dL — ABNORMAL LOW (ref 12.0–15.0)
MCH: 31.2 pg (ref 26.0–34.0)
MCHC: 34 g/dL (ref 30.0–36.0)
MCV: 91.8 fL (ref 80.0–100.0)
Platelets: 222 10*3/uL (ref 150–400)
RBC: 2.92 MIL/uL — ABNORMAL LOW (ref 3.87–5.11)
RDW: 12.3 % (ref 11.5–15.5)
WBC: 10.6 10*3/uL — ABNORMAL HIGH (ref 4.0–10.5)
nRBC: 0 % (ref 0.0–0.2)

## 2023-10-31 LAB — BASIC METABOLIC PANEL WITH GFR
Anion gap: 12 (ref 5–15)
BUN: 33 mg/dL — ABNORMAL HIGH (ref 6–20)
CO2: 25 mmol/L (ref 22–32)
Calcium: 9.4 mg/dL (ref 8.9–10.3)
Chloride: 92 mmol/L — ABNORMAL LOW (ref 98–111)
Creatinine, Ser: 3.59 mg/dL — ABNORMAL HIGH (ref 0.44–1.00)
GFR, Estimated: 15 mL/min — ABNORMAL LOW (ref >60)
Glucose, Bld: 219 mg/dL — ABNORMAL HIGH (ref 70–99)
Potassium: 3.6 mmol/L (ref 3.5–5.1)
Sodium: 129 mmol/L — ABNORMAL LOW (ref 135–145)

## 2023-10-31 LAB — HEPATIC FUNCTION PANEL
ALT: 10 U/L (ref 0–44)
AST: 12 U/L — ABNORMAL LOW (ref 15–41)
Albumin: 3.1 g/dL — ABNORMAL LOW (ref 3.5–5.0)
Alkaline Phosphatase: 126 U/L (ref 38–126)
Bilirubin, Direct: 0.1 mg/dL (ref 0.0–0.2)
Indirect Bilirubin: 0.8 mg/dL (ref 0.3–0.9)
Total Bilirubin: 0.9 mg/dL (ref 0.0–1.2)
Total Protein: 6 g/dL — ABNORMAL LOW (ref 6.5–8.1)

## 2023-10-31 LAB — HEPARIN LEVEL (UNFRACTIONATED)
Heparin Unfractionated: 0.41 [IU]/mL (ref 0.30–0.70)
Heparin Unfractionated: 0.28 [IU]/mL — ABNORMAL LOW (ref 0.30–0.70)
Heparin Unfractionated: 0.39 [IU]/mL (ref 0.30–0.70)

## 2023-10-31 LAB — GLUCOSE, CAPILLARY
Glucose-Capillary: 340 mg/dL — ABNORMAL HIGH (ref 70–99)
Glucose-Capillary: 407 mg/dL — ABNORMAL HIGH (ref 70–99)
Glucose-Capillary: 399 mg/dL — ABNORMAL HIGH (ref 70–99)
Glucose-Capillary: 326 mg/dL — ABNORMAL HIGH (ref 70–99)
Glucose-Capillary: 267 mg/dL — ABNORMAL HIGH (ref 70–99)

## 2023-10-31 LAB — TROPONIN I (HIGH SENSITIVITY)
Troponin I (High Sensitivity): 828 ng/L (ref <18)
Troponin I (High Sensitivity): 381 ng/L (ref <18)
Troponin I (High Sensitivity): 364 ng/L (ref <18)

## 2023-10-31 LAB — BETA-HYDROXYBUTYRIC ACID: Beta-Hydroxybutyric Acid: 0.17 mmol/L (ref 0.05–0.27)

## 2023-10-31 MED ORDER — HYDRALAZINE HCL 50 MG PO TABS
50.0000 mg | ORAL_TABLET | Freq: Three times a day (TID) | ORAL | Status: DC
  Administered 2023-11-01 – 2023-11-02 (×6): 50 mg via ORAL
  Filled 2023-10-31 (×6): qty 1

## 2023-10-31 MED ORDER — INSULIN ASPART 100 UNIT/ML IJ SOLN
3.0000 [IU] | Freq: Three times a day (TID) | INTRAMUSCULAR | Status: DC
  Administered 2023-10-31 – 2023-11-03 (×8): 3 [IU] via SUBCUTANEOUS

## 2023-10-31 MED ORDER — CALCIUM CARBONATE ANTACID 500 MG PO CHEW: 1.0000 | CHEWABLE_TABLET | Freq: Three times a day (TID) | ORAL | Status: DC | PRN

## 2023-10-31 MED ORDER — INSULIN ASPART 100 UNIT/ML IJ SOLN
12.0000 [IU] | Freq: Once | INTRAMUSCULAR | Status: AC
  Administered 2023-10-31: 12 [IU] via SUBCUTANEOUS

## 2023-10-31 MED ORDER — INSULIN GLARGINE-YFGN 100 UNIT/ML ~~LOC~~ SOLN
20.0000 [IU] | Freq: Two times a day (BID) | SUBCUTANEOUS | Status: DC
  Administered 2023-10-31 – 2023-11-02 (×5): 20 [IU] via SUBCUTANEOUS
  Filled 2023-10-31 (×8): qty 0.2

## 2023-10-31 MED ORDER — INSULIN GLARGINE-YFGN 100 UNIT/ML ~~LOC~~ SOLN
22.0000 [IU] | Freq: Every day | SUBCUTANEOUS | Status: DC
  Filled 2023-10-31 (×2): qty 0.22

## 2023-10-31 MED ORDER — LABETALOL HCL 5 MG/ML IV SOLN
10.0000 mg | INTRAVENOUS | Status: DC | PRN
  Administered 2023-10-31 – 2023-11-01 (×4): 10 mg via INTRAVENOUS
  Filled 2023-10-31 (×3): qty 4

## 2023-10-31 MED ORDER — HYDRALAZINE HCL 20 MG/ML IJ SOLN
10.0000 mg | Freq: Once | INTRAMUSCULAR | Status: AC
  Administered 2023-10-31: 10 mg via INTRAVENOUS
  Filled 2023-10-31: qty 1

## 2023-10-31 MED ORDER — PANTOPRAZOLE SODIUM 40 MG PO TBEC
40.0000 mg | DELAYED_RELEASE_TABLET | Freq: Every day | ORAL | Status: DC
  Administered 2023-10-31 – 2023-11-03 (×4): 40 mg via ORAL
  Filled 2023-10-31 (×4): qty 1

## 2023-10-31 MED ORDER — NITROGLYCERIN 2 % TD OINT
1.0000 [in_us] | TOPICAL_OINTMENT | Freq: Three times a day (TID) | TRANSDERMAL | Status: DC
  Administered 2023-10-31 – 2023-11-02 (×7): 1 [in_us] via TOPICAL
  Filled 2023-10-31 (×7): qty 1

## 2023-10-31 MED ORDER — ONDANSETRON HCL 4 MG/2ML IJ SOLN
4.0000 mg | Freq: Four times a day (QID) | INTRAMUSCULAR | Status: DC | PRN
  Administered 2023-10-31 – 2023-11-02 (×6): 4 mg via INTRAVENOUS
  Filled 2023-10-31 (×6): qty 2

## 2023-10-31 MED ORDER — CALCIUM CARBONATE ANTACID 500 MG PO CHEW
1.0000 | CHEWABLE_TABLET | Freq: Three times a day (TID) | ORAL | Status: DC | PRN
  Administered 2023-10-31: 200 mg via ORAL
  Filled 2023-10-31: qty 1

## 2023-10-31 NOTE — Progress Notes (Signed)
 PHARMACY - ANTICOAGULATION CONSULT NOTE  Pharmacy Consult for heparin  Indication: chest pain/ACS  Allergies  Allergen Reactions   Ciprofloxacin  Swelling and Other (See Comments)    Per pt caused lips swell and nauseous feeling   Levaquin [Levofloxacin] Swelling and Other (See Comments)    Per pt caused lips swell and nauseous feeling   Linaclotide Other (See Comments)    Cause severe dehydration   Promethazine  Other (See Comments)    Completely wipes out/fatigue   Buspar [Buspirone] Other (See Comments)    abd cramping   Advair Diskus [Fluticasone -Salmeterol] Other (See Comments)    Thrush    Biaxin [Clarithromycin] Rash   Hydroxyzine Palpitations    Patient Measurements: Height: 5\' 9"  (175.3 cm) Weight: 116.6 kg (257 lb) IBW/kg (Calculated) : 66.2 HEPARIN  DW (KG): 92.9  Vital Signs: Temp: 97.8 F (36.6 C) (05/26 0905) Temp Source: Oral (05/26 0905) BP: 198/84 (05/26 0800) Pulse Rate: 76 (05/26 0800)  Labs: Recent Labs    10/30/23 1318 10/30/23 1333 10/30/23 1351 10/30/23 1516 10/30/23 1731 10/30/23 2007 10/30/23 2147 10/31/23 0135 10/31/23 0800  HGB 8.8*  --  9.9*  --   --   --   --  9.1*  --   HCT 28.8*  --  29.0*  --   --   --   --  26.8*  --   PLT 190  --   --   --   --   --   --  222  --   HEPARINUNFRC  --   --   --   --   --   --   --   --  0.41  CREATININE  --    < > 3.00*  --  3.39*  --  3.52* 3.59*  --   TROPONINIHS 476*  --   --  579*  --  739*  --  828*  --    < > = values in this interval not displayed.    Estimated Creatinine Clearance: 25.3 mL/min (A) (by C-G formula based on SCr of 3.59 mg/dL (H)).   Medical History: Past Medical History:  Diagnosis Date   Anemia    Anxiety    Arthritis    Asthma    Balance problems    Bipolar disorder (HCC)    Charcot ankle    Chronic fatigue    Coronary artery disease    Depression    Diabetes mellitus    DKA, type 1 (HCC) 11/04/2011   Dysrhythmia    Elevated cholesterol    ESRD on  hemodialysis (HCC)    [facility]S at [facility]   Fibromyalgia    GERD (gastroesophageal reflux disease)    Headache    History of suicidal ideation    Hyperlipemia    Hypertension    Hypothyroidism    IBS (irritable bowel syndrome)    Memory changes    Obesity    Sleep apnea    wears cpap   Stress incontinence    Pt had surgery to correct this.   Tachycardia    Tobacco abuse    Tremor    UTI (lower urinary tract infection)     Assessment: 53yo female c/o CP and SOB, troponins elevated and rising >> to begin heparin . Troponin increasing , 579> 739> 828.  HL 0.41, therapeutic, no issues with infusion.  Goal of Therapy:  Heparin  level 0.3-0.7 units/ml Monitor platelets by anticoagulation protocol: Yes   Plan:  Continue Heparin  infusion at 1300 units/hr.  Monitor heparin  level in ~8 hours for confirmatory stable, and daily  and CBC.  Willodean Leven, BS Pharm D, BCPS Clinical Pharmacist 10/31/2023,9:48 AM

## 2023-10-31 NOTE — Progress Notes (Signed)
 RT Note: Pt states nasal mask felt too small last night for cpap, Pt provided a med FFM that she stated felt better. Within minutes pt pulled mask off stating she felt nauseous. CPAP on stand by to attempt later.

## 2023-10-31 NOTE — Progress Notes (Signed)
 PHARMACY - ANTICOAGULATION CONSULT NOTE  Pharmacy Consult for heparin  Indication: elevated troponin  Labs: Recent Labs    10/30/23 1318 10/30/23 1333 10/30/23 1351 10/30/23 1516 10/30/23 1731 10/30/23 2007 10/30/23 2147 10/31/23 0135 10/31/23 0800 10/31/23 1210 10/31/23 1515 10/31/23 2256  HGB 8.8*  --  9.9*  --   --   --   --  9.1*  --   --   --   --   HCT 28.8*  --  29.0*  --   --   --   --  26.8*  --   --   --   --   PLT 190  --   --   --   --   --   --  222  --   --   --   --   HEPARINUNFRC  --   --   --   --   --   --   --   --  0.41  --  0.28* 0.39  CREATININE  --    < > 3.00*  --  3.39*  --  3.52* 3.59*  --   --   --   --   TROPONINIHS 476*  --   --    < >  --    < >  --  828*  --  381* 364*  --    < > = values in this interval not displayed.   Assessment/Plan:  53yo female therapeutic on heparin  after rate change. Will continue infusion at current rate of 1450 units/hr and monitor daily level.  Lonnie Roberts, PharmD, BCPS 10/31/2023 11:26 PM

## 2023-10-31 NOTE — Inpatient Diabetes Management (Addendum)
 Inpatient Diabetes Program Recommendations  AACE/ADA: New Consensus Statement on Inpatient Glycemic Control (2015)  Target Ranges:  Prepandial:   less than 140 mg/dL      Peak postprandial:   less than 180 mg/dL (1-2 hours)      Critically ill patients:  140 - 180 mg/dL    Latest Reference Range & Units 10/30/23 13:33  Glucose 70 - 99 mg/dL 604 (HH)  (HH): Data is critically high  Latest Reference Range & Units 10/30/23 13:33 10/30/23 21:47  Beta-Hydroxybutyric Acid 0.05 - 0.27 mmol/L 3.88 (H) 0.17  (H): Data is abnormally high  Latest Reference Range & Units 10/30/23 13:19 10/30/23 14:23 10/30/23 15:29 10/30/23 15:58 10/30/23 16:32 10/30/23 17:06 10/30/23 17:36 10/30/23 18:13 10/30/23 18:51  Glucose-Capillary 70 - 99 mg/dL >540 (HH) >981 (HH)  IV Insulin  Drip Started >600 (HH) >600 (HH) >600 (HH) >600 (HH) >600 (HH) 561 (HH) 500 (H)    Latest Reference Range & Units 10/30/23 19:34 10/30/23 20:32 10/30/23 21:34 10/30/23 22:34 10/30/23 23:39 10/31/23 04:58 10/31/23 07:53  Glucose-Capillary 70 - 99 mg/dL 191 (H) 478 (H) 295 (H) 149 (H) 220 (H)  IV Insulin  Drip Stopped  15 units Semglee   3 units Novolog  340 (H)  7 units Novolog   407 (H)  12 units Novolog    (H): Data is abnormally high  Admit with: DKA (insulin  pump not working per MD notes)  History: Type 1 diabetes, ESRD, CHF  Home DM Meds: Tandem Insulin  Pump with Novolog         Dexcom G6 CGM        Uses Levemir  27 units BID when Pump Not working  Current Orders: Semglee  15 units daily      Novolog  Sensitive Correction Scale/ SSI (0-9 units) Q4 hours     ENDO: Dr. Felipe Horton with [facility] Last Seen 08/02/2023 No changes made to her Insulin  Pump    MD- Note pt takes larger dose of basal insulin  when off pump (Levemir  27 units BID)  Only received 15 units Semglee  to transition off the Insulin  Drip last PM  1. Please consider increasing the Semglee  Insulin  to 20 units BID (75% off pump plan)  2. Please Start Novolog   Meal Coverage: Novolog  3 units TID with meals HOLD if pt NPO HOLD if pt eats <50% meals   Addendum: Attempted to call pt this AM and again this afternoon to inquire about her insulin  pump.  No answer either times.  RN caring for pt told me pt was feeling poorly and asked RN to field all calls for her.  Will attempt phone call 05/27.     --Will follow patient during hospitalization--  Langston Pippins RN, MSN, CDCES Diabetes Coordinator Inpatient Glycemic Control Team Team Pager: [phone] (8a-5p)

## 2023-10-31 NOTE — Plan of Care (Signed)
  Problem: Education: Goal: Knowledge of General Education information will improve Description: Including pain rating scale, medication(s)/side effects and non-pharmacologic comfort measures Outcome: Progressing   Problem: Clinical Measurements: Goal: Ability to maintain clinical measurements within normal limits will improve Outcome: Progressing Goal: Will remain free from infection Outcome: Progressing Goal: Diagnostic test results will improve Outcome: Progressing Goal: Respiratory complications will improve Outcome: Progressing Goal: Cardiovascular complication will be avoided Outcome: Progressing   Problem: Activity: Goal: Risk for activity intolerance will decrease Outcome: Progressing   Problem: Coping: Goal: Level of anxiety will decrease Outcome: Progressing   Problem: Elimination: Goal: Will not experience complications related to bowel motility Outcome: Progressing Goal: Will not experience complications related to urinary retention Outcome: Progressing   Problem: Pain Managment: Goal: General experience of comfort will improve and/or be controlled Outcome: Progressing   Problem: Safety: Goal: Ability to remain free from injury will improve Outcome: Progressing   Problem: Skin Integrity: Goal: Risk for impaired skin integrity will decrease Outcome: Progressing   Problem: Coping: Goal: Ability to adjust to condition or change in health will improve Outcome: Progressing   Problem: Fluid Volume: Goal: Ability to maintain a balanced intake and output will improve Outcome: Progressing   Problem: Metabolic: Goal: Ability to maintain appropriate glucose levels will improve Outcome: Progressing   Problem: Nutritional: Goal: Maintenance of adequate nutrition will improve Outcome: Progressing Goal: Progress toward achieving an optimal weight will improve Outcome: Progressing   Problem: Skin Integrity: Goal: Risk for impaired skin integrity will  decrease Outcome: Progressing   Problem: Tissue Perfusion: Goal: Adequacy of tissue perfusion will improve Outcome: Progressing   Problem: Cardiac: Goal: Ability to maintain an adequate cardiac output will improve Outcome: Progressing   Problem: Fluid Volume: Goal: Ability to achieve a balanced intake and output will improve Outcome: Progressing   Problem: Metabolic: Goal: Ability to maintain appropriate glucose levels will improve Outcome: Progressing   Problem: Respiratory: Goal: Will regain and/or maintain adequate ventilation Outcome: Progressing   Problem: Urinary Elimination: Goal: Ability to achieve and maintain adequate renal perfusion and functioning will improve Outcome: Progressing

## 2023-10-31 NOTE — Consult Note (Signed)
 Jasmine [name] Admit Date: 10/30/2023 10/31/2023 Charletta Cons Requesting Physician:  Madera MD  Reason for Consult:  ESRD Comanagement HPI:  29F ESRD THS [facility] using L internal jugular TDC; history of HFrEF; CAD with history of PCI in 25; DM2; OSA on CPAP; bipolar disorder who presented yesterday to the ED with hyperglycemia after finding that her insulin  pump had stopped working.  Workup identified DKA and she was placed on insulin  drip.  Additionally she had chest pain with uptrending troponins consistent with NSTEMI for which she has started on heparin .  Patient seen in room without complaints other than feeling generally unwell, hot, fatigue.  She reports an usual dialysis treatment on 5/24, and that she achieved her outpatient EDW.  Labs today notable for K of 3.6, BUN 33, sodium of 129 with a glucose of 219.  Sodium was lower of course when her glucose was much higher.  Hemoglobin is 9.1 with an MCV of 91.8.  She is on room air with variable blood pressures.  I/Os: 1.2 L input thus far  ROS Balance of 12 systems is negative w/ exceptions as above  PMH  Past Medical History:  Diagnosis Date   Anemia    Anxiety    Arthritis    Asthma    Balance problems    Bipolar disorder (HCC)    Charcot ankle    Chronic fatigue    Coronary artery disease    Depression    Diabetes mellitus    DKA, type 1 (HCC) 11/04/2011   Dysrhythmia    Elevated cholesterol    ESRD on hemodialysis (HCC)    TTS at [facility]   Fibromyalgia    GERD (gastroesophageal reflux disease)    Headache    History of suicidal ideation    Hyperlipemia    Hypertension    Hypothyroidism    IBS (irritable bowel syndrome)    Memory changes    Obesity    Sleep apnea    wears cpap   Stress incontinence    Pt had surgery to correct this.   Tachycardia    Tobacco abuse    Tremor    UTI (lower urinary tract infection)    PSH  Past Surgical History:  Procedure Laterality Date   AV FISTULA PLACEMENT  Left 10/14/2023   Procedure: BRACHIOBASILIC ARTERIOVENOUS (AV) FISTULA CREATION LEFT;  Surgeon: Margherita Shell, MD;  Location: [facility] OR;  Service: Vascular;  Laterality: Left;   CORONARY IMAGING/OCT N/A 08/30/2023   Procedure: CORONARY IMAGING/OCT;  Surgeon: Kyra Phy, MD;  Location: [facility] INVASIVE CV LAB;  Service: Cardiovascular;  Laterality: N/A;   CORONARY STENT INTERVENTION N/A 08/30/2023   Procedure: CORONARY STENT INTERVENTION;  Surgeon: Kyra Phy, MD;  Location: [facility] INVASIVE CV LAB;  Service: Cardiovascular;  Laterality: N/A;   INCONTINENCE SURGERY     IR FLUORO GUIDE CV LINE LEFT  09/10/2023   IR US  GUIDE VASC ACCESS LEFT  09/10/2023   LEFT HEART CATH AND CORONARY ANGIOGRAPHY N/A 07/15/2023   Procedure: LEFT HEART CATH AND CORONARY ANGIOGRAPHY;  Surgeon: Kyra Phy, MD;  Location: [facility] INVASIVE CV LAB;  Service: Cardiovascular;  Laterality: N/A;   LEFT HEART CATH AND CORONARY ANGIOGRAPHY N/A 08/30/2023   Procedure: LEFT HEART CATH AND CORONARY ANGIOGRAPHY;  Surgeon: Kyra Phy, MD;  Location: [facility] INVASIVE CV LAB;  Service: Cardiovascular;  Laterality: N/A;   NASAL FRACTURE SURGERY     ovary removed     OVARY SURGERY  PUBOVAGINAL SLING  08/16/2011   Procedure: Gino Lais;  Surgeon: Livingston Rigg, MD;  Location: [facility] ORS;  Service: Urology;  Laterality: N/A;          TEE WITHOUT CARDIOVERSION N/A 09/08/2023   Procedure: ECHOCARDIOGRAM, TRANSESOPHAGEAL;  Surgeon: Laurann Pollock, MD;  Location: [facility] ORS;  Service: Endoscopy;  Laterality: N/A;   TUNNELLED CATHETER EXCHANGE N/A 09/30/2023   Procedure: TUNNELLED CATHETER EXCHANGE;  Surgeon: Patrick Boor, MD;  Location: [facility] INVASIVE CV LAB;  Service: Cardiovascular;  Laterality: N/A;   TUNNELLED CATHETER EXCHANGE N/A 10/07/2023   Procedure: TUNNELLED CATHETER EXCHANGE;  Surgeon: Melodie Spry, MD;  Location: [facility] INVASIVE CV LAB;  Service: Cardiovascular;  Laterality: N/A;   UTERINE FIBROID SURGERY  2001   FH  Family History   Problem Relation Age of Onset   Asthma Mother    Bipolar disorder Mother    Heart disease Father    Lymphoma Father    Hypertension Father    Thyroid  disease Father    Hyperlipidemia Father    Diabetes Father    Cancer Paternal Grandmother        lung and breast   Bladder Cancer Paternal Grandfather    Suicidality Maternal Grandfather    Thyroid  disease Brother    SH  reports that she quit smoking about 12 years ago. Her smoking use included cigarettes. She started smoking about 32 years ago. She has a 15 pack-year smoking history. She has never used smokeless tobacco. She reports that she does not drink alcohol and does not use drugs. Allergies  Allergies  Allergen Reactions   Ciprofloxacin  Swelling and Other (See Comments)    Per pt caused lips swell and nauseous feeling   Levaquin [Levofloxacin] Swelling and Other (See Comments)    Per pt caused lips swell and nauseous feeling   Linaclotide Other (See Comments)    Cause severe dehydration   Promethazine  Other (See Comments)    Completely wipes out/fatigue   Buspar [Buspirone] Other (See Comments)    abd cramping   Advair Diskus [Fluticasone -Salmeterol] Other (See Comments)    Thrush    Biaxin [Clarithromycin] Rash   Hydroxyzine Palpitations   Home medications Prior to Admission medications   Medication Sig Start Date End Date Taking? Authorizing Provider  acetaminophen  (TYLENOL ) 500 MG tablet Take 500-1,000 mg by mouth every 6 (six) hours as needed (pain.).   Yes [provider]  aspirin  EC 81 MG tablet Take 1 tablet (81 mg total) by mouth daily with breakfast. Swallow whole. 09/09/23  Yes Emokpae, Courage, MD  azelastine  (ASTELIN ) 0.1 % nasal spray Place 2 sprays into both nostrils 2 (two) times daily. Use in each nostril as directed Patient taking differently: Place 2 sprays into both nostrils daily as needed for allergies or rhinitis. 08/01/23  Yes Antonio Baumgarten, NP  buPROPion  (WELLBUTRIN  XL) 150 MG 24 hr  tablet Take 1 tablet (150 mg total) by mouth daily. 10/28/23 02/25/24 Yes Todd Fossa, MD  carvedilol  (COREG ) 12.5 MG tablet Take 12.5 mg by mouth 2 (two) times daily with a meal. 01/17/23 01/17/24 Yes [provider]  Cholecalciferol  (VITAMIN D -3 PO) Take 1 tablet by mouth in the morning.   Yes [provider]  clopidogrel  (PLAVIX ) 75 MG tablet Take 1 tablet (75 mg total) by mouth daily. 02/22/23  Yes Clearnce Curia, NP  diclofenac  Sodium (VOLTAREN ) 1 % GEL Apply 2 g topically 4 (four) times daily as needed (pain.).   Yes [provider]  DULoxetine  (CYMBALTA ) 30 MG capsule Take 1 capsule (30 mg total) by mouth daily. 10/11/23 02/08/24 Yes Todd Fossa, MD  folic acid  (FOLVITE ) 800 MCG tablet Take 800 mcg by mouth in the morning.   Yes [provider]  hydrALAZINE  (APRESOLINE ) 25 MG tablet Take 25 mg by mouth 2 (two) times daily. 05/03/23  Yes [provider]  HYDROcodone -acetaminophen  (NORCO/VICODIN) 5-325 MG tablet Take 1 tablet by mouth every 6 (six) hours as needed for moderate pain (pain score 4-6). 10/14/23  Yes Cordie Deters, PA-C  insulin  detemir (LEVEMIR ) 100 UNIT/ML injection Inject 27 Units into the skin 2 (two) times daily as needed (if insulin  pump inoperable). 04/04/23  Yes [provider]  lamoTRIgine  (LAMICTAL ) 200 MG tablet Take 1 tablet (200 mg total) by mouth every evening. 10/21/23 04/18/24 Yes Todd Fossa, MD  levothyroxine  (SYNTHROID ) 175 MCG tablet Take 1.5 tablets (mcg) by mouth on Sundays in the mornings & take 1 tablet (175 mcg) by mouth on all other days. 11/20/21  Yes [provider]  LORazepam  (ATIVAN ) 0.5 MG tablet Take 1 tablet (0.5 mg total) by mouth daily as needed for anxiety. 11/11/23 12/11/23 Yes Todd Fossa, MD  Melatonin 10 MG TABS Take 10 mg by mouth at bedtime.   Yes [provider]  NOVOLOG  100 UNIT/ML injection Inject into the skin continuous. Sliding Scale Insulin  pump 2.5 units basal Bolus  with meal depending on the size 03/15/22  Yes [provider]  ondansetron  (ZOFRAN -ODT) 4 MG disintegrating tablet Take 4 mg by mouth every 8 (eight) hours as needed for nausea or vomiting. 03/17/22  Yes [provider]  OXYGEN Inhale 3 L into the lungs at bedtime.   Yes [provider]  rizatriptan  (MAXALT ) 10 MG tablet Take 10 mg by mouth as needed for migraine. May repeat in 2 hours if needed   Yes [provider]  rosuvastatin  (CRESTOR ) 10 MG tablet Take 1 tablet (10 mg total) by mouth daily. Patient taking differently: Take 10 mg by mouth every evening. 07/29/23 10/30/23 Yes Sheryle Donning, MD  sevelamer  carbonate (RENVELA ) 800 MG tablet Take 1,600 mg by mouth 3 (three) times daily with meals. 07/28/23  Yes [provider]  torsemide  (DEMADEX ) 20 MG tablet Take 40 mg by mouth every Monday, Wednesday, and Friday.   Yes [provider]  Continuous Blood Gluc Sensor MISC 1 each by Does not apply route as directed. Use as directed every 14 days. May dispense FreeStyle [employer] or similar.    [provider]  glucagon (GLUCAGEN  HYPOKIT) 1 MG SOLR injection Inject 1 mg into the skin once as needed for up to 1 dose for low blood sugar. GlucaGen  HypoKit 1 mg Injection 03/31/23   Johnson, Clanford L, MD  predniSONE (DELTASONE) 10 MG tablet Take 10 mg by mouth See admin instructions. TAKE 6 TABS ON DAY 1, 5 TABS ON DAY 2, 4 TABS ON DAY 3, 3 TABS ON DAY 4, 2 TABS ON DAY 5 AND 1 TAB ON DAY 6. Patient not taking: Reported on 10/21/2023 10/10/23   [provider]  PRESCRIPTION MEDICATION See admin instructions.  IDPN LV CUSTOM (100GM)   INFUSE 1 BAG DURING HD 3 TIMES WEEKLY AS FOLLOWS 1ST WEEK 55ML/HR; 2ND WEEK 110ML/HR; 3RD WEEK 163ML/HR; THEN CONTINUE WITH GOAL RATE= 163ML/HR OVER 3HRS . TITRATE AS TOLERATED. ADMINISTER IV VIA VENOUS DRIP CHAMBER.    [provider]    Current Medications Scheduled  Meds:  aspirin   EC  81 mg Oral Q breakfast   buPROPion   150 mg Oral Daily   carvedilol   12.5 mg Oral BID WC   Chlorhexidine  Gluconate Cloth  6 each Topical Q0600   Chlorhexidine  Gluconate Cloth  6 each Topical Q0600   clopidogrel   75 mg Oral Daily   DULoxetine   30 mg Oral Daily   hydrALAZINE   25 mg Oral BID   insulin  aspart  0-9 Units Subcutaneous Q4H   insulin  aspart  3 Units Subcutaneous TID WC   insulin  glargine-yfgn  20 Units Subcutaneous BID   lamoTRIgine   200 mg Oral QPM   levothyroxine   175 mcg Oral Q0600   melatonin  9 mg Oral QHS   nitroGLYCERIN   1 inch Topical TID   oxyCODONE   5 mg Oral Once   pantoprazole   40 mg Oral Daily   rosuvastatin   10 mg Oral QPM   sevelamer  carbonate  1,600 mg Oral TID WC   torsemide   40 mg Oral Q M,W,F   Continuous Infusions:  heparin  1,300 Units/hr (10/31/23 0600)   PRN Meds:.acetaminophen , dextrose , labetalol , ondansetron  (ZOFRAN ) IV  CBC Recent Labs  Lab 10/30/23 1318 10/30/23 1351 10/31/23 0135  WBC 5.6  --  10.6*  HGB 8.8* 9.9* 9.1*  HCT 28.8* 29.0* 26.8*  MCV 97.6  --  91.8  PLT 190  --  222   Basic Metabolic Panel Recent Labs  Lab 10/30/23 1333 10/30/23 1351 10/30/23 1731 10/30/23 2147 10/31/23 0135  NA 120* 119* 124* 129* 129*  K 5.1 5.7* 3.6 3.4* 3.6  CL 82* 85* 85* 93* 92*  CO2 20*  --  25 27 25   GLUCOSE 983* >700* 706* 212* 219*  BUN 29* 40* 35* 33* 33*  CREATININE 3.08* 3.00* 3.39* 3.52* 3.59*  CALCIUM  8.7*  --  9.1 9.5 9.4    Physical Exam  Blood pressure (!) 198/84, pulse 76, temperature 97.8 F (36.6 C), temperature source Oral, resp. rate (!) 22, height 5\' 9"  (1.753 m), weight 116.6 kg, last menstrual period 03/23/2017, SpO2 95%. GEN: NAD, lying in bed, obese ENT: NCAT EYES: EOMI CV: Regular, no murmur or rub PULM: Clear throughout ABD: Soft, nontender SKIN: TDC exit site not bandaged, no tenderness to palpation throughout EXT: No peripheral edema VASCULAR: LUE AVF +B/T  Assessment 15F ESRD THS  here with DKA, chest pain with elevated troponin consistent with NSTEMI.  ESRD THS [facility]: Outpatient records unavailable.  No indications for dialysis on schedule today.  Plan for usual dialysis tomorrow, orders written.  Using IJ TDC, AVF maturing. DKA after malfunction of insulin  pump.  Anion gap improved, glucose improved.  BHB normalized. NSTEMI with history of CAD/PCI earlier this year: On heparin  drip.  Per primary.  Chest pain-free currently. Normocytic anemia: Continue to watch Hypertension/volume: Continue home medicines including carvedilol , hydralazine , torsemide ; UF tomorrow, trend CKD-BMD: Calcium  stable.  Continue sevelamer   Plan As above  Charletta Cons  10/31/2023, 9:32 AM

## 2023-10-31 NOTE — Progress Notes (Signed)
*  PRELIMINARY RESULTS* Echocardiogram 2D Echocardiogram has been performed.  Jasmine [name] 10/31/2023, 11:03 AM

## 2023-10-31 NOTE — Progress Notes (Addendum)
 PHARMACY - ANTICOAGULATION CONSULT NOTE  Pharmacy Consult for heparin  Indication: chest pain/ACS  Allergies  Allergen Reactions   Ciprofloxacin  Swelling and Other (See Comments)    Per pt caused lips swell and nauseous feeling   Levaquin [Levofloxacin] Swelling and Other (See Comments)    Per pt caused lips swell and nauseous feeling   Linaclotide Other (See Comments)    Cause severe dehydration   Promethazine  Other (See Comments)    Completely wipes out/fatigue   Buspar [Buspirone] Other (See Comments)    abd cramping   Advair Diskus [Fluticasone -Salmeterol] Other (See Comments)    Thrush    Biaxin [Clarithromycin] Rash   Hydroxyzine Palpitations    Patient Measurements: Height: 5\' 9"  (175.3 cm) Weight: 116.6 kg (257 lb) IBW/kg (Calculated) : 66.2 HEPARIN  DW (KG): 92.9  Vital Signs: Temp: 97.9 F (36.6 C) (05/26 1200) Temp Source: Oral (05/26 1200) BP: 126/64 (05/26 1500) Pulse Rate: 64 (05/26 1500)  Labs: Recent Labs    10/30/23 1318 10/30/23 1333 10/30/23 1351 10/30/23 1516 10/30/23 1731 10/30/23 2007 10/30/23 2147 10/31/23 0135 10/31/23 0800 10/31/23 1210 10/31/23 1515  HGB 8.8*  --  9.9*  --   --   --   --  9.1*  --   --   --   HCT 28.8*  --  29.0*  --   --   --   --  26.8*  --   --   --   PLT 190  --   --   --   --   --   --  222  --   --   --   HEPARINUNFRC  --   --   --   --   --   --   --   --  0.41  --  0.28*  CREATININE  --    < > 3.00*  --  3.39*  --  3.52* 3.59*  --   --   --   TROPONINIHS 476*  --   --    < >  --    < >  --  828*  --  381* 364*   < > = values in this interval not displayed.    Estimated Creatinine Clearance: 25.3 mL/min (A) (by C-G formula based on SCr of 3.59 mg/dL (H)).   Medical History: Past Medical History:  Diagnosis Date   Anemia    Anxiety    Arthritis    Asthma    Balance problems    Bipolar disorder (HCC)    Charcot ankle    Chronic fatigue    Coronary artery disease    Depression    Diabetes mellitus     DKA, type 1 (HCC) 11/04/2011   Dysrhythmia    Elevated cholesterol    ESRD on hemodialysis (HCC)    TTS at [facility]   Fibromyalgia    GERD (gastroesophageal reflux disease)    Headache    History of suicidal ideation    Hyperlipemia    Hypertension    Hypothyroidism    IBS (irritable bowel syndrome)    Memory changes    Obesity    Sleep apnea    wears cpap   Stress incontinence    Pt had surgery to correct this.   Tachycardia    Tobacco abuse    Tremor    UTI (lower urinary tract infection)     Assessment: 53yo female c/o CP and SOB, troponins elevated and rising >> to begin  heparin . Troponin increasing , 579> 739> 828.  HL 0.41> 0.28 , just slightly below goal, no issues with infusion.  Goal of Therapy:  Heparin  level 0.3-0.7 units/ml Monitor platelets by anticoagulation protocol: Yes   Plan:  Increase Heparin  infusion to 1450 units/hr. Monitor heparin  level in ~6 hours , and daily  and CBC.  Brandis Wixted, BS Pharm D, BCPS Clinical Pharmacist 10/31/2023,4:27 PM

## 2023-10-31 NOTE — TOC Initial Note (Signed)
 Transition of Care [facility]) - Initial/Assessment Note    Patient Details  Name: Jasmine [name] MRN: [id number] Date of Birth: [birth year]  Transition of Care [facility]) CM/SW Contact:    Ander Katos, LCSW Phone Number: 10/31/2023, 9:12 AM  Clinical Narrative:  Pt admitted for DKA. Assessment completed due to high risk readmission score. Pt lives with her husband who assists with ADLs. She has cane, walker, wheelchair, and home O2 ([employer]). Pt's dialysis is TTS at [facility]. Her husband provides transportation to appointments. No current home health services. Plan is for return home when medically stable. TOC will follow.                    Expected Discharge Plan: Home/Self Care Barriers to Discharge: Continued Medical Work up   Patient Goals and CMS Choice Patient states their goals for this hospitalization and ongoing recovery are:: return home   Choice offered to / list presented to : Patient [place] ownership interest in [facility].provided to::  (n/a)    Expected Discharge Plan and Services In-house Referral: Clinical Social Work     Living arrangements for the past 2 months: Single Family Home                                      Prior Living Arrangements/Services Living arrangements for the past 2 months: Single Family Home Lives with:: Spouse Patient language and need for interpreter reviewed:: Yes Do you feel safe going back to the place where you live?: Yes      Need for Family Participation in Patient Care: Yes (Comment) Care giver support system in place?: Yes (comment) Current home services: DME (cane, walker, wheelchair, home O2) Criminal Activity/Legal Involvement Pertinent to Current Situation/Hospitalization: No - Comment as needed  Activities of Daily Living   ADL Screening (condition at time of admission) Independently performs ADLs?: Yes (appropriate for developmental age) Is the patient deaf or have difficulty hearing?:  No Does the patient have difficulty seeing, even when wearing glasses/contacts?: No Does the patient have difficulty concentrating, remembering, or making decisions?: No  Permission Sought/Granted                  Emotional Assessment         Alcohol / Substance Use: Not Applicable Psych Involvement: No (comment)  Admission diagnosis:  DKA (diabetic ketoacidosis) (HCC) [E11.10] Diabetic ketoacidosis without coma associated with type 1 diabetes mellitus (HCC) [E10.10] Chronic kidney disease, unspecified CKD stage [N18.9] Patient Active Problem List   Diagnosis Date Noted   Chest pain 10/30/2023   Bacteremia 09/08/2023   MRSA bacteremia 09/06/2023   Sepsis due to methicillin resistant Staphylococcus aureus (MRSA) (HCC) 09/05/2023   Myocardial injury 09/05/2023   Abscess of right thigh 09/05/2023   Hypertensive emergency 03/29/2023   Pseudohyponatremia 03/29/2023   Hypoalbuminemia due to protein-calorie malnutrition (HCC) 03/29/2023   End-stage renal disease on hemodialysis (HCC) 03/29/2023   Chronic HFrEF (heart failure with reduced ejection fraction) (HCC) 03/29/2023   DKA, type 1 (HCC) 03/29/2023   Asthma 03/07/2023   CAD (coronary artery disease) 03/07/2023   Orthostatic hypotension 03/15/2022   Cerebral vascular disease 03/15/2022   Nonintractable headache    Elevated troponin 09/11/2021   Hypercalcemia 09/10/2021   GERD (gastroesophageal reflux disease) 09/09/2021   DKA (diabetic ketoacidosis) (HCC) 09/09/2021   Other hyperlipidemia 09/09/2021   Chronic migraine w/o aura w/o status  migrainosus, not intractable 03/23/2021   Diastolic heart failure (HCC) 03/24/2020   Chronic respiratory failure with hypoxia (HCC) 10/05/2019   Depression 10/01/2019   Memory loss 10/01/2019   Marital estrangement 06/19/2019   Peripheral neuropathy 02/15/2019   Dizziness 09/27/2018   Polypharmacy 09/27/2018   Severe recurrent major depression without psychotic features (HCC)  08/30/2018   Bipolar 2 disorder, major depressive episode (HCC) 07/10/2018   MDD (major depressive disorder), recurrent, severe, 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/10/2018   CKD (chronic kidney disease), stage IV (HCC) 07/01/2018   Transient Hypoglycemia 07/01/2018   Diabetic peripheral neuropathy (HCC) 01/17/2017   OSA on CPAP 07/27/2016   Wheezing 07/27/2016   Hyperglycemia 12/25/2012   Acute renal failure superimposed on stage 4 chronic kidney disease (HCC) 12/25/2012   Postnasal drip 10/02/2012   Hyperkalemia 09/25/2012   Obesity, Class III, BMI 40-49.9 (morbid obesity) 09/24/2012   Essential hypertension 09/24/2012   Bipolar II disorder (HCC) 09/24/2012   Uncontrolled diabetes mellitus with hyperglycemia, with long-term current use of insulin  (HCC) 11/04/2011   Gastroenteritis 11/03/2011   Hyponatremia 11/03/2011   Elevated lipase 11/03/2011   Acquired hypothyroidism 11/03/2011   Tobacco abuse 11/03/2011   SUI (stress urinary incontinence, female) 08/16/2011   PCP:  Lory Rough PA-C Pharmacy:   [facility] [place], [place] - [address] [place] [address] Phone: [phone] Fax: [phone]  [facility] [address] [place] [address] Phone: [phone] Fax: [phone]     Social Drivers of Health (SDOH) Social History: SDOH Screenings   Food Insecurity: No Food Insecurity (10/30/2023)  Housing: Low Risk  (10/30/2023)  Transportation Needs: No Transportation Needs (10/30/2023)  Utilities: Not At Risk (10/30/2023)  Alcohol Screen: Low Risk  (08/30/2018)  Depression (PHQ2-9): High Risk (12/14/2021)  Financial Resource Strain: Low Risk  (12/22/2022)   Received from [facility]  Physical Activity: Inactive (01/23/2019)  Social Connections: Unknown (10/19/2021)   [facility]  Stress: Stress Concern Present (01/23/2019)  Tobacco Use: Medium Risk (10/30/2023)   SDOH  Interventions:     Readmission Risk Interventions    10/31/2023    9:11 AM 09/06/2023    8:18 AM  Readmission Risk Prevention Plan  Transportation Screening Complete Complete  HRI or Home Care Consult  Complete  Social Work Consult for Recovery Care Planning/Counseling  Complete  Palliative Care Screening  Not Applicable  Medication Review [employer]) Complete Complete  HRI or Home Care Consult Complete   SW Recovery Care/Counseling Consult Complete   Palliative Care Screening Not Applicable   Skilled Nursing Facility Not Applicable

## 2023-10-31 NOTE — Plan of Care (Signed)

## 2023-10-31 NOTE — Progress Notes (Signed)
 Progress Note   Patient: Jasmine [name] ZOX:[id number] DOB: 1970-11-13 DOA: 10/30/2023     1 DOS: the patient was seen and examined on 10/31/2023   Brief hospital admission narrative: Jasmine [name] is a 53 y.o. female with medical history significant for ESRD on dialysis Tuesday Thursday Saturday, coronary artery disease, CHF with reduced EF, diabetes mellitus, OSA on CPAP, bipolar disorder. Patient presented to the ED with reports of high blood sugars.  Patient reports she checked her blood sugars this morning and it was in the 400s, and then she realized that her insulin  pump was not working.  Later blood sugar read high.  No vomiting no diarrhea and no abdominal pain.  She was prescribed steroids by her orthopedist for hip and back pain earlier this month but she did not take them.   Reports chest pain this morning that lasted about 2 hours, midsternal, described as heaviness, spontaneously resolved.  She denies prior chest pains. Also reports difficulty breathing over the past few days that was worse today.  No weight gain, no lower extremity swelling.  Has not missed any dialysis sessions.  Her last dialysis was yesterday Saturday.  She still makes urine and takes her torsemide .  She is on 3 L O2 at night only with her CPAP.   Recent hospitalization 3/31 to 4/6 for sepsis secondary to MRSA bacteremia, MRSA right thigh abscess with I&D done.  Underwent TEE- 4/3 which was negative for vegetations.   ED Course: O2 sats down to 86 percent on room air, placed on 2 L.  Heart rate 70s.  Respiratory rate 14-23.  Blood pressure systolic 123-157. Blood glucose 983, serum bicarb 20.  BHB 3.88.  VBG shows pH of 7.34. Due to problems with the lab, sodium is still pending, hence anion gap not calculated.  I-STAT shows sodium of 119. Two-view chest x-ray shows vascular congestion question trace edema. Insulin  drip started, 1 L LR bolus given. EDP talked to nephrology, will see in consult for  dialysis.  Assessment and plan End-stage renal disease on hemodialysis [facility]) -On dialysis Tuesday Thursday Saturday, last dialysis session was on 10/29/2023 - Nephrology service has been consulted to assist with dialysis management while hospitalized - No emergent dialysis needed currently appreciated - Patient is still making urine and chronically on diuretics - Will continue to follow electrolytes and volume status.   -Heart healthy/low-sodium and adequate hydration discussed with patient.  DKA (diabetic ketoacidosis) (HCC) -Blood sugar 983, serum bicarb 20, BHB 3.88.  VBG shows pH of 7.34.  Sodium- 120, anion gap 18.  - Adequate response to insulin  drip and fluid resuscitation - Patient has been started on adjusted dose of Semglee , sliding scale insulin  and meal coverage - Will continue to follow CBG fluctuation. - D5 carbohydrate diet discussed with patient.  Bipolar 2 disorder, major depressive episode (HCC) -No suicidal ideation or hallucination - Continue the use of Lamictal , Cymbalta  and bupropion   Chest pain/demand ischemia/NSTEMI -Hx of CAD, with recent DES X 4 - 08/30/23.  - Elevated troponins appreciated at time of admission and rising. - Case discussed with cardiology service who recommended echocardiogram and heparin  drip . - Unfortunately patient chronic renal failure playing a role in elevation troponins; demand ischemia most likely - EKG and telemetry without ischemic process - On today's examination no chest pain or shortness of breath reported.   - Continue carvedilol , aspirin , Plavix  and statin  Chronic respiratory failure with hypoxia (HCC) -Chronic hypoxic respiratory failure + OSA on CPAP- on  3 L O2 at night only. - Will continue home regimen.  Essential hypertension -Overall stable - Continue home antihypertensive regimen - As needed labetalol  has been added.  Pseudohyponatremia -In the setting of hyperglycemia - Resolved after stabilizing blood sugar  levels - Continue to follow electrolytes.  Class II obesity - Low-calorie diet and portion control discussed with patient -Body mass index is 37.95 kg/m.    Subjective:  Chest pain-free no complaining of nausea, vomiting, shortness of breath or any other complaints.  DKA process has been stabilized and patient transitioned off insulin  drip.  Physical Exam: Vitals:   10/31/23 1300 10/31/23 1330 10/31/23 1400 10/31/23 1500  BP: (!) 167/72 (!) 203/92 (!) 136/37 126/64  Pulse: 65 71 60 64  Resp: (!) 22 16 (!) 25 18  Temp:      TempSrc:      SpO2: 98% 98% 98% 98%  Weight:      Height:       General exam: Alert, awake, oriented x 3; in no acute distress.  Chest pain-free. Respiratory system: No wheezing or crackles appreciated on exam; no using accessory muscles. Cardiovascular system: Rate controlled, no rubs, no gallops, unable to properly assess JVD with body habitus. Gastrointestinal system: Abdomen is obese, nondistended, soft and nontender. No organomegaly or masses felt. Normal bowel sounds heard. Central nervous system: No focal neurological deficits. Extremities: No cyanosis or clubbing. Skin: No petechiae. Psychiatry: Judgement and insight appear normal.  Flat affect on exam; no suicidal ideation or hallucination.   Data Reviewed: High sensitive troponin: 476 >> 579 >> 739 >> 828 >> 381 >> 364 CBC: WBCs 10.6, hemoglobin 9.1 and platelet count 222K Basic metabolic panel: Sodium 129, potassium 3.6, chloride 92, bicarb 25, BUN 33, creatinine 3.59 and GFR 15.  Anion gap 12   Family Communication: No family at bedside.  Disposition: Status is: Inpatient Remains inpatient appropriate because: Continue IV therapy.  Time spent: 50 minutes  Author: Justina Oman, MD 10/31/2023 4:19 PM  For on call review [email or web address].

## 2023-11-01 ENCOUNTER — Encounter (HOSPITAL_COMMUNITY): Payer: Self-pay | Admitting: Internal Medicine

## 2023-11-01 ENCOUNTER — Ambulatory Visit: Admitting: Physician Assistant

## 2023-11-01 ENCOUNTER — Telehealth (HOSPITAL_COMMUNITY): Payer: Self-pay | Admitting: Pharmacy Technician

## 2023-11-01 ENCOUNTER — Other Ambulatory Visit (HOSPITAL_COMMUNITY): Payer: Self-pay

## 2023-11-01 DIAGNOSIS — J9611 Chronic respiratory failure with hypoxia: Secondary | ICD-10-CM | POA: Diagnosis not present

## 2023-11-01 DIAGNOSIS — N186 End stage renal disease: Secondary | ICD-10-CM | POA: Diagnosis not present

## 2023-11-01 DIAGNOSIS — I214 Non-ST elevation (NSTEMI) myocardial infarction: Secondary | ICD-10-CM | POA: Diagnosis not present

## 2023-11-01 DIAGNOSIS — E782 Mixed hyperlipidemia: Secondary | ICD-10-CM | POA: Diagnosis not present

## 2023-11-01 DIAGNOSIS — E0810 Diabetes mellitus due to underlying condition with ketoacidosis without coma: Secondary | ICD-10-CM | POA: Diagnosis not present

## 2023-11-01 DIAGNOSIS — I5022 Chronic systolic (congestive) heart failure: Secondary | ICD-10-CM | POA: Diagnosis not present

## 2023-11-01 DIAGNOSIS — I2510 Atherosclerotic heart disease of native coronary artery without angina pectoris: Secondary | ICD-10-CM

## 2023-11-01 LAB — IRON AND TIBC
Iron: 46 ug/dL (ref 28–170)
Saturation Ratios: 15 % (ref 10.4–31.8)
TIBC: 306 ug/dL (ref 250–450)
UIBC: 260 ug/dL

## 2023-11-01 LAB — BASIC METABOLIC PANEL WITH GFR
Anion gap: 10 (ref 5–15)
BUN: 41 mg/dL — ABNORMAL HIGH (ref 6–20)
CO2: 26 mmol/L (ref 22–32)
Calcium: 9.5 mg/dL (ref 8.9–10.3)
Chloride: 93 mmol/L — ABNORMAL LOW (ref 98–111)
Creatinine, Ser: 3.85 mg/dL — ABNORMAL HIGH (ref 0.44–1.00)
GFR, Estimated: 14 mL/min — ABNORMAL LOW (ref >60)
Glucose, Bld: 305 mg/dL — ABNORMAL HIGH (ref 70–99)
Potassium: 3.5 mmol/L (ref 3.5–5.1)
Sodium: 129 mmol/L — ABNORMAL LOW (ref 135–145)

## 2023-11-01 LAB — GLUCOSE, CAPILLARY
Glucose-Capillary: 128 mg/dL — ABNORMAL HIGH (ref 70–99)
Glucose-Capillary: 187 mg/dL — ABNORMAL HIGH (ref 70–99)
Glucose-Capillary: 204 mg/dL — ABNORMAL HIGH (ref 70–99)
Glucose-Capillary: 217 mg/dL — ABNORMAL HIGH (ref 70–99)
Glucose-Capillary: 290 mg/dL — ABNORMAL HIGH (ref 70–99)
Glucose-Capillary: 312 mg/dL — ABNORMAL HIGH (ref 70–99)
Glucose-Capillary: 337 mg/dL — ABNORMAL HIGH (ref 70–99)

## 2023-11-01 LAB — CBC
HCT: 28.9 % — ABNORMAL LOW (ref 36.0–46.0)
Hemoglobin: 9.6 g/dL — ABNORMAL LOW (ref 12.0–15.0)
MCH: 29.9 pg (ref 26.0–34.0)
MCHC: 33.2 g/dL (ref 30.0–36.0)
MCV: 90 fL (ref 80.0–100.0)
Platelets: 250 10*3/uL (ref 150–400)
RBC: 3.21 MIL/uL — ABNORMAL LOW (ref 3.87–5.11)
RDW: 12.3 % (ref 11.5–15.5)
WBC: 12.3 10*3/uL — ABNORMAL HIGH (ref 4.0–10.5)
nRBC: 0 % (ref 0.0–0.2)

## 2023-11-01 LAB — PHOSPHORUS: Phosphorus: 4.2 mg/dL (ref 2.5–4.6)

## 2023-11-01 LAB — HEPARIN LEVEL (UNFRACTIONATED): Heparin Unfractionated: 0.32 [IU]/mL (ref 0.30–0.70)

## 2023-11-01 LAB — FERRITIN: Ferritin: 196 ng/mL (ref 11–307)

## 2023-11-01 MED ORDER — HEPARIN SODIUM (PORCINE) 1000 UNIT/ML IJ SOLN
INTRAMUSCULAR | Status: AC
  Filled 2023-11-01: qty 5

## 2023-11-01 MED ORDER — HEPARIN SODIUM (PORCINE) 1000 UNIT/ML DIALYSIS: 1000.0000 [IU] | INTRAMUSCULAR | Status: DC | PRN

## 2023-11-01 MED ORDER — ALTEPLASE 2 MG IJ SOLR: 2.0000 mg | Freq: Once | INTRAMUSCULAR | Status: DC | PRN

## 2023-11-01 MED ORDER — IRON SUCROSE 500 MG IVPB - SIMPLE MED
500.0000 mg | Freq: Every day | INTRAVENOUS | Status: AC
  Administered 2023-11-01 – 2023-11-02 (×2): 500 mg via INTRAVENOUS
  Filled 2023-11-01 (×2): qty 500

## 2023-11-01 NOTE — Progress Notes (Signed)
   11/01/23 1938  BiPAP/CPAP/SIPAP  Reason BIPAP/CPAP not in use Non-compliant   Patient states she is unable to use a hospital CPAP due to it not having humidity. This RT encouraged patient to have her home unit brought in tomorrow and we could set it up for her to use. She stated she would.

## 2023-11-01 NOTE — Plan of Care (Signed)

## 2023-11-01 NOTE — Progress Notes (Signed)
 Nephrology Follow-Up Consult note   Assessment/Recommendations: Jasmine [name] is a/an 53 y.o. female with a past medical history significant for CHF, CAD, DM2, OSA, bipolar, admitted for DKA complicated by NSTEMI.       ESRD: TTS at [facility].  Plan for dialysis today.  Some degree of volume overload and plan for ultrafiltration with dialysis to help with this.  Using [facility] while AVF matures  Anemia of CKD: Hemoglobin 9.6.  Obtain iron studies.  Consider ESA  Secondary hyperparathyroidism: Calcium  is acceptable.  Obtain phosphorus.  Continue home sevelamer    DKA/uncontrolled DM2 with hyperglycemia: Insulin  management per primary team  Hypertension: Ultrafiltration as tolerated.  Continue home medications.  Consider making oral diuretics daily  NSTEMI: On heparin  drip.  Cardiology evaluating.   Recommendations conveyed to primary service.    Levorn Reason [facility] 11/01/2023 8:56 AM  ___________________________________________________________  CC: Chest pain  Interval History/Subjective: Patient states she feels tired today.  Continues to feel short of breath.  Denies nausea or vomiting.   Medications:  Current Facility-Administered Medications  Medication Dose Route Frequency Provider Last Rate Last Admin   acetaminophen  (TYLENOL ) tablet 1,000 mg  1,000 mg Oral Q6H PRN Arnulfo Larch, MD   1,000 mg at 10/31/23 0004   aspirin  EC tablet 81 mg  81 mg Oral Q breakfast Emokpae, Ejiroghene E, MD   81 mg at 11/01/23 4696   buPROPion  (WELLBUTRIN  XL) 24 hr tablet 150 mg  150 mg Oral Daily Emokpae, Ejiroghene E, MD   150 mg at 11/01/23 0815   calcium  carbonate (TUMS - dosed in mg elemental calcium ) chewable tablet 200 mg of elemental calcium   1 tablet Oral TID WC PRN Justina Oman, MD   200 mg of elemental calcium  at 10/31/23 1344   carvedilol  (COREG ) tablet 12.5 mg  12.5 mg Oral BID WC Emokpae, Ejiroghene E, MD   12.5 mg at 11/01/23 2952   Chlorhexidine   Gluconate Cloth 2 % PADS 6 each  6 each Topical Q0600 Emokpae, Ejiroghene E, MD   6 each at 11/01/23 0816   Chlorhexidine  Gluconate Cloth 2 % PADS 6 each  6 each Topical Q0600 Nan Aver, MD       clopidogrel  (PLAVIX ) tablet 75 mg  75 mg Oral Daily Emokpae, Ejiroghene E, MD   75 mg at 11/01/23 0814   dextrose  50 % solution 0-50 mL  0-50 mL Intravenous PRN Emokpae, Ejiroghene E, MD       DULoxetine  (CYMBALTA ) DR capsule 30 mg  30 mg Oral Daily Emokpae, Ejiroghene E, MD   30 mg at 11/01/23 0815   heparin  ADULT infusion 100 units/mL (25000 units/250mL)  1,450 Units/hr Intravenous Continuous Justina Oman, MD 14.5 mL/hr at 11/01/23 0600 1,450 Units/hr at 11/01/23 0600   hydrALAZINE  (APRESOLINE ) tablet 50 mg  50 mg Oral TID Segars, Jonathan, MD   50 mg at 11/01/23 0815   insulin  aspart (novoLOG ) injection 0-9 Units  0-9 Units Subcutaneous Q4H Emokpae, Ejiroghene E, MD   5 Units at 11/01/23 0816   insulin  aspart (novoLOG ) injection 3 Units  3 Units Subcutaneous TID WC Justina Oman, MD   3 Units at 11/01/23 0815   insulin  glargine-yfgn (SEMGLEE ) injection 20 Units  20 Units Subcutaneous BID Justina Oman, MD   20 Units at 10/31/23 2004   labetalol  (NORMODYNE ) injection 10 mg  10 mg Intravenous Q2H PRN Segars, Jonathan, MD   10 mg at 11/01/23 0129   lamoTRIgine  (LAMICTAL ) tablet 200 mg  200 mg  Oral QPM Emokpae, Ejiroghene E, MD   200 mg at 10/31/23 1646   levothyroxine  (SYNTHROID ) tablet 175 mcg  175 mcg Oral Q0600 Emokpae, Ejiroghene E, MD   175 mcg at 11/01/23 0534   melatonin tablet 9 mg  9 mg Oral QHS Emokpae, Ejiroghene E, MD   9 mg at 10/31/23 1938   nitroGLYCERIN  (NITROGLYN) 2 % ointment 1 inch  1 inch Topical TID Segars, Jonathan, MD   1 inch at 11/01/23 0534   ondansetron  (ZOFRAN ) injection 4 mg  4 mg Intravenous Q6H PRN Arnulfo Larch, MD   4 mg at 11/01/23 0217   oxyCODONE  (Oxy IR/ROXICODONE ) immediate release tablet 5 mg  5 mg Oral Once Segars, Jonathan, MD       pantoprazole   (PROTONIX ) EC tablet 40 mg  40 mg Oral Daily Justina Oman, MD   40 mg at 11/01/23 0815   rosuvastatin  (CRESTOR ) tablet 10 mg  10 mg Oral QPM Emokpae, Ejiroghene E, MD   10 mg at 10/31/23 1647   sevelamer  carbonate (RENVELA ) tablet 1,600 mg  1,600 mg Oral TID WC Emokpae, Ejiroghene E, MD   1,600 mg at 11/01/23 0814   torsemide  (DEMADEX ) tablet 40 mg  40 mg Oral Q M,W,F Emokpae, Ejiroghene E, MD   40 mg at 10/31/23 0816      Review of Systems: 10 systems reviewed and negative except per interval history/subjective  Physical Exam: Vitals:   11/01/23 0733 11/01/23 0814  BP:  (!) 177/69  Pulse:  79  Resp:    Temp: 98.2 F (36.8 C)   SpO2:     No intake/output data recorded.  Intake/Output Summary (Last 24 hours) at 11/01/2023 0856 Last data filed at 11/01/2023 0600 Gross per 24 hour  Intake 319.96 ml  Output 300 ml  Net 19.96 ml   Constitutional: tired-appearing, no acute distress ENMT: ears and nose without scars or lesions, MMM CV: normal rate, trace edema Respiratory: Bilateral chest rise, normal work of breathing Gastrointestinal: soft, non-tender, no palpable masses or hernias Skin: no visible lesions or rashes Psych: alert, judgement/insight appropriate, appropriate mood and affect   Test Results I personally reviewed new and old clinical labs and radiology tests Lab Results  Component Value Date   NA 129 (L) 11/01/2023   K 3.5 11/01/2023   CL 93 (L) 11/01/2023   CO2 26 11/01/2023   BUN 41 (H) 11/01/2023   CREATININE 3.85 (H) 11/01/2023   GFR 29.09 (L) 03/24/2020   CALCIUM  9.5 11/01/2023   ALBUMIN 3.1 (L) 10/31/2023   PHOS 3.2 09/10/2023    CBC Recent Labs  Lab 10/30/23 1318 10/30/23 1351 10/31/23 0135 11/01/23 0427  WBC 5.6  --  10.6* 12.3*  HGB 8.8* 9.9* 9.1* 9.6*  HCT 28.8* 29.0* 26.8* 28.9*  MCV 97.6  --  91.8 90.0  PLT 190  --  222 250

## 2023-11-01 NOTE — Consult Note (Signed)
 Cardiology Consultation:   Patient ID: Jasmine [name]; [id number]; [birth year]   Admit date: 10/30/2023 Date of Consult: 11/01/2023  Primary Care Provider: Lory Rough., PA-C Primary Cardiologist: Arun K Thukkani, MD  History of Present Illness:   Ms. Jasmine [name] is a 53 y.o. female currently admitted for evaluation of recent onset of chest congestion and cough associated with chest tightness.  She states that she has been compliant with regular hemodialysis on Tuesdays, Thursdays, and Saturdays.  Symptoms began on Sunday.  Denies any fevers or chills, does state that her blood sugars were substantially elevated and per discussion with hospitalist team she has apparently been having some trouble with malfunctioning of her insulin  pump.  She has a history of CAD, underwent PET myocardial perfusion study in January that was abnormal, done as part of a pre-renal transplantation evaluation.  She underwent cardiac catheterization in February demonstrating multivessel CAD.  She was considered for surgical revascularization versus PCI, and ultimately underwent stent intervention with DES of the first diagonal, DES x 2 to the mid to distal LAD, and DES to the mid RCA in March with Dr. Lorie Rook.  She has been on DAPT since that time.  Today she does not report any chest tightness, no shortness of breath at rest.  She is on IV heparin , reviewed the remainder of her medications.  Follow-up echocardiogram yesterday demonstrates LVEF approximately 55% with distal septal hypokinesis, not described on prior studies.  ECG shows septal Q waves which look to be old however. High-sensitivity troponin I trend consistent with NSTEMI, type I versus type II with peak in the 800s.  ROS:  Pertinent review in history of present illness.  No palpitations or syncope.  Reports intermittent feeling of abdominal fullness when she has excess fluid, not generally peripheral edema.  Past Medical History:  Diagnosis Date    Anemia    Anxiety    Arthritis    Asthma    Balance problems    Bipolar disorder (HCC)    Charcot ankle    Chronic fatigue    Coronary artery disease    Depression    DKA, type 1 (HCC) 11/04/2011   Elevated cholesterol    ESRD on hemodialysis (HCC)    TTS at [facility]   Fibromyalgia    GERD (gastroesophageal reflux disease)    Headache    History of suicidal ideation    Hyperlipemia    Hypertension    Hypothyroidism    IBS (irritable bowel syndrome)    Memory changes    Obesity    Sleep apnea    wears cpap   Stress incontinence    Pt had surgery to correct this.   Tobacco abuse    Tremor     Past Surgical History:  Procedure Laterality Date   AV FISTULA PLACEMENT Left 10/14/2023   Procedure: BRACHIOBASILIC ARTERIOVENOUS (AV) FISTULA CREATION LEFT;  Surgeon: Margherita Shell, MD;  Location: [facility] OR;  Service: Vascular;  Laterality: Left;   CORONARY IMAGING/OCT N/A 08/30/2023   Procedure: CORONARY IMAGING/OCT;  Surgeon: Kyra Phy, MD;  Location: [facility] INVASIVE CV LAB;  Service: Cardiovascular;  Laterality: N/A;   CORONARY STENT INTERVENTION N/A 08/30/2023   Procedure: CORONARY STENT INTERVENTION;  Surgeon: Kyra Phy, MD;  Location: [facility] INVASIVE CV LAB;  Service: Cardiovascular;  Laterality: N/A;   INCONTINENCE SURGERY     IR FLUORO GUIDE CV LINE LEFT  09/10/2023   IR US  GUIDE VASC ACCESS LEFT  09/10/2023   LEFT  HEART CATH AND CORONARY ANGIOGRAPHY N/A 07/15/2023   Procedure: LEFT HEART CATH AND CORONARY ANGIOGRAPHY;  Surgeon: Kyra Phy, MD;  Location: [facility] INVASIVE CV LAB;  Service: Cardiovascular;  Laterality: N/A;   LEFT HEART CATH AND CORONARY ANGIOGRAPHY N/A 08/30/2023   Procedure: LEFT HEART CATH AND CORONARY ANGIOGRAPHY;  Surgeon: Kyra Phy, MD;  Location: [facility] INVASIVE CV LAB;  Service: Cardiovascular;  Laterality: N/A;   NASAL FRACTURE SURGERY     ovary removed     OVARY SURGERY     PUBOVAGINAL SLING  08/16/2011   Procedure: Gino Lais;   Surgeon: Livingston Rigg, MD;  Location: [facility] ORS;  Service: Urology;  Laterality: N/A;          TEE WITHOUT CARDIOVERSION N/A 09/08/2023   Procedure: ECHOCARDIOGRAM, TRANSESOPHAGEAL;  Surgeon: Laurann Pollock, MD;  Location: [facility] ORS;  Service: Endoscopy;  Laterality: N/A;   TUNNELLED CATHETER EXCHANGE N/A 09/30/2023   Procedure: TUNNELLED CATHETER EXCHANGE;  Surgeon: Patrick Boor, MD;  Location: [facility] INVASIVE CV LAB;  Service: Cardiovascular;  Laterality: N/A;   TUNNELLED CATHETER EXCHANGE N/A 10/07/2023   Procedure: TUNNELLED CATHETER EXCHANGE;  Surgeon: Melodie Spry, MD;  Location: [facility] INVASIVE CV LAB;  Service: Cardiovascular;  Laterality: N/A;   UTERINE FIBROID SURGERY  2001     Inpatient Medications: Scheduled Meds:  aspirin  EC  81 mg Oral Q breakfast   buPROPion   150 mg Oral Daily   carvedilol   12.5 mg Oral BID WC   Chlorhexidine  Gluconate Cloth  6 each Topical Q0600   Chlorhexidine  Gluconate Cloth  6 each Topical Q0600   clopidogrel   75 mg Oral Daily   DULoxetine   30 mg Oral Daily   hydrALAZINE   50 mg Oral TID   insulin  aspart  0-9 Units Subcutaneous Q4H   insulin  aspart  3 Units Subcutaneous TID WC   insulin  glargine-yfgn  20 Units Subcutaneous BID   lamoTRIgine   200 mg Oral QPM   levothyroxine   175 mcg Oral Q0600   melatonin  9 mg Oral QHS   nitroGLYCERIN   1 inch Topical TID   oxyCODONE   5 mg Oral Once   pantoprazole   40 mg Oral Daily   rosuvastatin   10 mg Oral QPM   sevelamer  carbonate  1,600 mg Oral TID WC   torsemide   40 mg Oral Q M,W,F   Continuous Infusions:  heparin  1,450 Units/hr (11/01/23 0600)   PRN Meds: acetaminophen , calcium  carbonate, dextrose , labetalol , ondansetron  (ZOFRAN ) IV  Allergies:    Allergies  Allergen Reactions   Ciprofloxacin  Swelling and Other (See Comments)    Per pt caused lips swell and nauseous feeling   Levaquin [Levofloxacin] Swelling and Other (See Comments)    Per pt caused lips swell and nauseous feeling   Linaclotide Other (See  Comments)    Cause severe dehydration   Promethazine  Other (See Comments)    Completely wipes out/fatigue   Buspar [Buspirone] Other (See Comments)    abd cramping   Advair Diskus [Fluticasone -Salmeterol] Other (See Comments)    Thrush    Biaxin [Clarithromycin] Rash   Hydroxyzine Palpitations    Social History:   Social History   Tobacco Use   Smoking status: Former    Current packs/day: 0.00    Average packs/day: 0.8 packs/day for 20.0 years (15.0 ttl pk-yrs)    Types: Cigarettes    Start date: 06/08/1991    Quit date: 06/08/2011    Years since quitting: 12.4   Smokeless tobacco: Never  Substance  Use Topics   Alcohol use: No    Family History:   The patient's family history includes Asthma in her mother; Bipolar disorder in her mother; Bladder Cancer in her paternal grandfather; Cancer in her paternal grandmother; Diabetes in her father; Heart disease in her father; Hyperlipidemia in her father; Hypertension in her father; Lymphoma in her father; Suicidality in her maternal grandfather; Thyroid  disease in her brother and father.  Physical Exam/Data:   Vitals:   11/01/23 0500 11/01/23 0600 11/01/23 0733 11/01/23 0814  BP: (!) 183/68 (!) 176/56  (!) 177/69  Pulse: 79 78  79  Resp: (!) 26 16    Temp: 98.2 F (36.8 C)  98.2 F (36.8 C)   TempSrc: Oral  Oral   SpO2: 97% 98%    Weight:      Height:        Intake/Output Summary (Last 24 hours) at 11/01/2023 0919 Last data filed at 11/01/2023 0600 Gross per 24 hour  Intake 319.96 ml  Output 300 ml  Net 19.96 ml   Filed Weights   10/30/23 1316  Weight: 116.6 kg   Body mass index is 37.95 kg/m.   Gen: Patient appears comfortable at rest. HEENT: Conjunctiva and lids normal, oropharynx clear. Neck: Supple, no elevated JVP or carotid bruits. Lungs: Clear to auscultation anteriorly, nonlabored breathing at rest. Cardiac: Regular rate and rhythm, no S3, 2/6 systolic murmur, no pericardial rub. Abdomen: Soft, nontender,  bowel sounds present. Extremities: No pitting edema, distal pulses 2+. Skin: Warm and dry. Musculoskeletal: No kyphosis. Neuropsychiatric: Alert and oriented x3, affect grossly appropriate.  EKG:  An ECG dated 10/30/2023 was personally reviewed today and demonstrated:  Sinus rhythm with septal Q waves, old.  Telemetry:  I personally reviewed telemetry which shows sinus rhythm.  Relevant CV Studies:  Cardiac catheterization 08/30/2023:   Mid LAD to Dist LAD lesion is 80% stenosed.   Prox RCA to Mid RCA lesion is 70% stenosed.   1st Diag lesion is 80% stenosed.   A stent was successfully placed.   A stent was successfully placed.   A stent was successfully placed.   Post intervention, there is a 0% residual stenosis.   Post intervention, there is a 0% residual stenosis.   Post intervention, there is a 0% residual stenosis.   1.  Low syntax score multivessel disease treated involving the first diagonal, mid to distal LAD, and mid right coronary artery. 2.  DES x 1 first diagonal. 3.  DES x 2 mid to distal LAD with OCT imaging optimization. 4.  DES x 1 mid RCA. 5.  LVEDP of 33 mmHg.  Laboratory Data:  Chemistry Recent Labs  Lab 10/30/23 2147 10/31/23 0135 11/01/23 0427  NA 129* 129* 129*  K 3.4* 3.6 3.5  CL 93* 92* 93*  CO2 27 25 26   GLUCOSE 212* 219* 305*  BUN 33* 33* 41*  CREATININE 3.52* 3.59* 3.85*  CALCIUM  9.5 9.4 9.5  GFRNONAA 15* 15* 14*  ANIONGAP 11 12 10     Recent Labs  Lab 10/30/23 1333 10/31/23 0135  PROT 6.7 6.0*  ALBUMIN 3.3* 3.1*  AST 21 12*  ALT 13 10  ALKPHOS 134* 126  BILITOT 2.0* 0.9   Hematology Recent Labs  Lab 10/30/23 1318 10/30/23 1351 10/31/23 0135 11/01/23 0427  WBC 5.6  --  10.6* 12.3*  RBC 2.95*  --  2.92* 3.21*  HGB 8.8* 9.9* 9.1* 9.6*  HCT 28.8* 29.0* 26.8* 28.9*  MCV 97.6  --  91.8  90.0  MCH 29.8  --  31.2 29.9  MCHC 30.6  --  34.0 33.2  RDW 12.7  --  12.3 12.3  PLT 190  --  222 250   Cardiac Enzymes Recent Labs   Lab 10/30/23 1516 10/30/23 2007 10/31/23 0135 10/31/23 1210 10/31/23 1515  TROPONINIHS 579* 739* 828* 381* 364*    Lipid Panel     Component Value Date/Time   CHOL 151 08/15/2023 0000   TRIG 67 08/15/2023 0000   HDL 79 08/15/2023 0000   CHOLHDL 1.9 08/15/2023 0000   CHOLHDL 3.2 09/12/2021 0338   VLDL 28 09/12/2021 0338   LDLCALC 59 08/15/2023 0000   LABVLDL 13 08/15/2023 0000    Radiology/Studies:  ECHOCARDIOGRAM COMPLETE Result Date: 10/31/2023    ECHOCARDIOGRAM REPORT   Patient Name:   BRENDALIZ KUK Date of Exam: 10/31/2023 Medical Rec #:  [id number]         Height:       69.0 in Accession #:    [id number]        Weight:       257.0 lb Date of Birth:  14-Jul-1970         BSA:          2.298 m Patient Age:    52 years          BP:           171/64 mmHg Patient Gender: F                 HR:           76 bpm. Exam Location:  [facility] Procedure: 2D Echo, Cardiac Doppler and Color Doppler (Both Spectral and Color            Flow Doppler were utilized during procedure). Indications:    Elevated Troponin  History:        Patient has prior history of Echocardiogram examinations, most                 recent 09/08/2023. CAD; Risk Factors:Hypertension, Diabetes,                 Dyslipidemia and Former Smoker. ESRD on dialysis.  Sonographer:    Denese Finn RCS Referring Phys: [id number] CARLOS MADERA IMPRESSIONS  1. Distal septal hypokinesis . Left ventricular ejection fraction, by estimation, is 55%. The left ventricle has low normal function. The left ventricle has no regional wall motion abnormalities. Left ventricular diastolic parameters were normal.  2. Right ventricular systolic function is normal. The right ventricular size is normal.  3. Left atrial size was moderately dilated.  4. Right atrial size was ? dialysis catheter in RA.  5. The mitral valve is abnormal. Trivial mitral valve regurgitation. No evidence of mitral stenosis. Moderate mitral annular calcification.  6. The aortic valve is  tricuspid. There is moderate calcification of the aortic valve. There is moderate thickening of the aortic valve. Aortic valve regurgitation is not visualized. Mild aortic valve stenosis.  7. The inferior vena cava is normal in size with greater than 50% respiratory variability, suggesting right atrial pressure of 3 mmHg. FINDINGS  Left Ventricle: Distal septal hypokinesis. Left ventricular ejection fraction, by estimation, is 55%. The left ventricle has low normal function. The left ventricle has no regional wall motion abnormalities. Strain was performed and the global longitudinal strain is indeterminate. The left ventricular internal cavity size was normal in size. There is no left ventricular hypertrophy. Left ventricular diastolic parameters  were normal. Right Ventricle: The right ventricular size is normal. No increase in right ventricular wall thickness. Right ventricular systolic function is normal. Left Atrium: Left atrial size was moderately dilated. Right Atrium: Right atrial size was ? dialysis catheter in RA. Pericardium: There is no evidence of pericardial effusion. Mitral Valve: The mitral valve is abnormal. There is mild thickening of the mitral valve leaflet(s). There is mild calcification of the mitral valve leaflet(s). Moderate mitral annular calcification. Trivial mitral valve regurgitation. No evidence of mitral valve stenosis. Tricuspid Valve: The tricuspid valve is normal in structure. Tricuspid valve regurgitation is not demonstrated. No evidence of tricuspid stenosis. Aortic Valve: The aortic valve is tricuspid. There is moderate calcification of the aortic valve. There is moderate thickening of the aortic valve. Aortic valve regurgitation is not visualized. Mild aortic stenosis is present. Aortic valve mean gradient measures 6.0 mmHg. Aortic valve peak gradient measures 14.0 mmHg. Aortic valve area, by VTI measures 1.69 cm. Pulmonic Valve: The pulmonic valve was normal in structure.  Pulmonic valve regurgitation is trivial. No evidence of pulmonic stenosis. Aorta: The aortic root is normal in size and structure. Venous: The inferior vena cava is normal in size with greater than 50% respiratory variability, suggesting right atrial pressure of 3 mmHg. IAS/Shunts: No atrial level shunt detected by color flow Doppler. Additional Comments: 3D was performed not requiring image post processing on an independent workstation and was indeterminate.  LEFT VENTRICLE PLAX 2D LVIDd:         5.30 cm   Diastology LVIDs:         3.00 cm   LV e' medial:    6.74 cm/s LV PW:         1.10 cm   LV E/e' medial:  26.3 LV IVS:        1.00 cm   LV e' lateral:   9.46 cm/s LVOT diam:     1.70 cm   LV E/e' lateral: 18.7 LV SV:         77 LV SV Index:   33 LVOT Area:     2.27 cm  RIGHT VENTRICLE RV S prime:     13.30 cm/s TAPSE (M-mode): 2.4 cm LEFT ATRIUM             Index        RIGHT ATRIUM           Index LA diam:        5.20 cm 2.26 cm/m   RA Area:     15.40 cm LA Vol (A2C):   82.7 ml 35.98 ml/m  RA Volume:   41.00 ml  17.84 ml/m LA Vol (A4C):   76.8 ml 33.42 ml/m LA Biplane Vol: 84.4 ml 36.72 ml/m  AORTIC VALVE AV Area (Vmax):    2.00 cm AV Area (Vmean):   1.82 cm AV Area (VTI):     1.69 cm AV Vmax:           187.00 cm/s AV Vmean:          116.000 cm/s AV VTI:            0.454 m AV Peak Grad:      14.0 mmHg AV Mean Grad:      6.0 mmHg LVOT Vmax:         165.00 cm/s LVOT Vmean:        93.000 cm/s LVOT VTI:          0.339 m LVOT/AV VTI ratio: 0.75  AORTA Ao Root diam: 3.10 cm MITRAL VALVE                TRICUSPID VALVE MV Area (PHT): 4.60 cm     TR Peak grad:   41.7 mmHg MV Decel Time: 165 msec     TR Vmax:        323.00 cm/s MR Peak grad: 173.2 mmHg MR Mean grad: 126.0 mmHg    SHUNTS MR Vmax:      658.00 cm/s   Systemic VTI:  0.34 m MR Vmean:     532.0 cm/s    Systemic Diam: 1.70 cm MV E velocity: 177.00 cm/s MV A velocity: 133.00 cm/s MV E/A ratio:  1.33 Janelle Mediate MD Electronically signed by Janelle Mediate  MD Signature Date/Time: 10/31/2023/11:54:49 AM    Final    DG Chest 2 View Result Date: 10/30/2023 CLINICAL DATA:  Shortness of breath EXAM: CHEST - 2 VIEW COMPARISON:  X-ray 09/10/2023.  Older exams as well FINDINGS: Underinflation. Left IJ double-lumen catheter with tip overlying the right atrium. Stable cardiopericardial silhouette with some vascular congestion. Question trace edema. No pneumothorax or effusion. No consolidation. Overlapping cardiac leads. Films are under penetrated. IMPRESSION: Underinflation. Vascular congestion. Question trace edema. Double-lumen left IJ catheter. Electronically Signed   By: Adrianna Horde M.D.   On: 10/30/2023 14:26    Assessment and Plan:   1.  NSTEMI, type I versus type II.  Had chest pressure on Sunday in the setting of hyperglycemia, chest congestion and cough.  Peak high-sensitivity troponin I in the 800s, she has not had any recurring symptoms under observation on IV heparin .  ECG shows old septal Q waves.  Echocardiogram yesterday demonstrates LVEF approximately 55% with distal septal hypokinesis.  She is hemodynamically stable.  2.  Multivessel CAD status post DES to the first diagonal, DES x 2 to the mid to distal LAD, and DES to the mid RCA in March following an abnormal PET myocardial perfusion study done earlier in the year as part of a renal transplantation evaluation.  She does not describe regular angina at baseline.  3.  ESRD on hemodialysis.  4.  Type 1 diabetes mellitus, fluctuating control on insulin  pump.  Presented with DKA.  5.  Mixed hyperlipidemia, LDL 59 in March on Crestor  10 mg daily.  Discussed with patient and hospitalist team.  Do not plan on pursuing cardiac catheterization at this time.  She reports no recurring angina and is otherwise hemodynamically stable.  Would continue 48-hour course of IV heparin .  Otherwise on aspirin  81 mg daily, Coreg  12.5 mg twice daily, Plavix  75 mg daily, hydralazine  50 mg 3 times a day, and Crestor   10 mg daily.  For questions or updates, please contact [facility] Please consult [email or web address] for contact info under   Signed, Teddie Favre, MD  11/01/2023 9:19 AM

## 2023-11-01 NOTE — Progress Notes (Signed)
 Progress Note   Patient: Jasmine [name] QMV:[id number] DOB: Apr 02, 1971 DOA: 10/30/2023     2 DOS: the patient was seen and examined on 11/01/2023   Brief hospital admission narrative: Jasmine [name] is a 53 y.o. female with medical history significant for ESRD on dialysis Tuesday Thursday Saturday, coronary artery disease, CHF with reduced EF, diabetes mellitus, OSA on CPAP, bipolar disorder. Patient presented to the ED with reports of high blood sugars.  Patient reports she checked her blood sugars this morning and it was in the 400s, and then she realized that her insulin  pump was not working.  Later blood sugar read high.  No vomiting no diarrhea and no abdominal pain.  She was prescribed steroids by her orthopedist for hip and back pain earlier this month but she did not take them.   Reports chest pain this morning that lasted about 2 hours, midsternal, described as heaviness, spontaneously resolved.  She denies prior chest pains. Also reports difficulty breathing over the past few days that was worse today.  No weight gain, no lower extremity swelling.  Has not missed any dialysis sessions.  Her last dialysis was yesterday Saturday.  She still makes urine and takes her torsemide .  She is on 3 L O2 at night only with her CPAP.   Recent hospitalization 3/31 to 4/6 for sepsis secondary to MRSA bacteremia, MRSA right thigh abscess with I&D done.  Underwent TEE- 4/3 which was negative for vegetations.   ED Course: O2 sats down to 86 percent on room air, placed on 2 L.  Heart rate 70s.  Respiratory rate 14-23.  Blood pressure systolic 123-157. Blood glucose 983, serum bicarb 20.  BHB 3.88.  VBG shows pH of 7.34. Due to problems with the lab, sodium is still pending, hence anion gap not calculated.  I-STAT shows sodium of 119. Two-view chest x-ray shows vascular congestion question trace edema. Insulin  drip started, 1 L LR bolus given. EDP talked to nephrology, will see in consult for  dialysis.  Assessment and plan End-stage renal disease on hemodialysis [facility]) -On dialysis Tuesday Thursday Saturday, last dialysis session was on 10/29/2023 - Nephrology service has been consulted to assist with dialysis management while hospitalized - Planning for hemodialysis later today (11/01/2023). - Patient is still making urine and chronically on diuretics - Will continue to follow electrolytes and volume status.   -Heart healthy/low-sodium and adequate hydration discussed with patient.  DKA (diabetic ketoacidosis) (HCC) -Blood sugar 983, serum bicarb 20, BHB 3.88.  VBG shows pH of 7.34.  Sodium- 120, anion gap 18.  - Adequate response to insulin  drip and fluid resuscitation - Patient has been started on adjusted dose of Semglee , sliding scale insulin  and meal coverage - Will continue to follow CBG fluctuation. - D5 carbohydrate diet discussed with patient.  Bipolar 2 disorder, major depressive episode (HCC) -No suicidal ideation or hallucination - Continue the use of Lamictal , Cymbalta  and bupropion   Chest pain/demand ischemia/NSTEMI -Hx of CAD, with recent DES X 4 - 08/30/23.  - Elevated troponins appreciated at time of admission and rising. - Case discussed with cardiology service who recommended echocardiogram.  -Patient's 2D echo demonstrating distal hypokinesia; ruling her in for NSTEMI. -Will continue heparin  drip for 48 hours and medical management. - Unfortunately patient chronic renal failure playing a role in elevation troponins; demand ischemia most likely - EKG and telemetry without ischemic process - On today's examination no chest pain or shortness of breath reported.   - Continue carvedilol , aspirin , Plavix   and statin  Chronic respiratory failure with hypoxia (HCC) -Chronic hypoxic respiratory failure + OSA on CPAP- on 3 L O2 at night only. - Will continue home regimen. - Good saturation appreciated on room air.  Essential hypertension -Overall stable -  Continue home antihypertensive regimen - As needed labetalol  has been added.  Pseudohyponatremia -In the setting of hyperglycemia - Resolved after stabilizing blood sugar levels - Continue to follow electrolytes.  Class II obesity - Low-calorie diet and portion control discussed with patient -Body mass index is 37.77 kg/m.    Subjective:  Patient denies chest pain; no nausea, no vomiting, no shortness of breath.  Overall feeling better.  Good saturation on room air.  Physical Exam: Vitals:   11/01/23 0930 11/01/23 1000 11/01/23 1027 11/01/23 1033  BP:  (!) 168/63  (!) 140/69  Pulse: 80 79  75  Resp: 18 15  18   Temp:    97.9 F (36.6 C)  TempSrc:    Oral  SpO2: (!) 85% 98%  91%  Weight:   116 kg   Height:       General exam: Alert, awake, oriented x 3; no chest pain or shortness of breath.  Overall feeling better.  Patient denies nausea vomiting. Respiratory system: C no frank crackles, no wheezing, no using accessory muscles.  Good saturation on room air. Cardiovascular system: Rate controlled, no rubs, no gallops, unable to properly assess JVD with body habitus. Gastrointestinal system: Abdomen is obese, nondistended, soft and nontender. No organomegaly or masses felt. Normal bowel sounds heard. Central nervous system: No focal neurological deficits. Extremities: No cyanosis or clubbing. Skin: No petechiae. Psychiatry: Judgement and insight appear normal. Mood & affect appropriate.  No suicidal ideation, no hallucinations.   Data Reviewed: High sensitive troponin: 476 >> 579 >> 739 >> 828 >> 381 >> 364 CBC: WBCs 10.6, hemoglobin 9.1 and platelet count 222K Basic metabolic panel: Sodium 129, potassium 3.5, chloride 93, bicarb 26, BUN 41, creatinine 3.85 and GFR 14 CBC: White blood cells 12.3, hemoglobin 9.6 and platelet count 250K Phosphorus: 4.2  Family Communication: No family at bedside.  Disposition: Status is: Inpatient Remains inpatient appropriate because:  Continue IV therapy.  Time spent: 50 minutes  Author: Justina Oman, MD 11/01/2023 11:10 AM  For on call review [email or web address].

## 2023-11-01 NOTE — Progress Notes (Signed)
   HEMODIALYSIS TREATMENT NOTE:  Uneventful 3.5 hour treatment completed using [facility] TDC. Catheter exit site is unremarkable. Goal met: 1.5 liters removed without interruption in ultrafiltration.  Hemodynamically stable throughout session.  All blood was returned.  Meds given:  Venofer 500 mg.  Post-HD:  11/01/23 1840  Vital Signs  Temp 98.2 F (36.8 C)  Temp Source Oral  Pulse Rate 77  Pulse Rate Source Monitor  Resp (!) 23  BP (!) 166/65  BP Location Right Arm  BP Method Automatic  Patient Position (if appropriate) Lying  Oxygen Therapy  SpO2 94 %  O2 Device Room Air  Pain Assessment  Pain Score 0  Dialysis Weight  Weight 114.4 kg  Type of Weight Post-Dialysis  Post Treatment  Dialyzer Clearance Lightly streaked  Hemodialysis Intake (mL) 250 mL  Liters Processed 87.1  Fluid Removed (mL) 1500 mL  Tolerated HD Treatment Yes  Post-Hemodialysis Comments Goal met  Fistula / Graft Left Upper arm Arteriovenous fistula  Placement Date/Time: 10/14/23 1039   Placed prior to admission: No  Orientation: Left  Access Location: Upper arm  Access Type: Arteriovenous fistula  Fistula / Graft Assessment Thrill;Bruit  Status Patent    Waunita Haff, RN AP [facility]

## 2023-11-01 NOTE — Progress Notes (Signed)
 Report called to Lianne RN for pt tranport to room 331.

## 2023-11-01 NOTE — Telephone Encounter (Signed)
 Pharmacy Patient Advocate Encounter  Insurance verification completed.    The patient is insured through [employer] [place] .     Ran test claim for Tresbia 100unit flextouch pen and the current 30 day co-pay is $0.00.   This test claim was processed through  [facility]- copay amounts may vary at other pharmacies due to pharmacy/plan contracts, or as the patient moves through the different stages of their insurance plan.

## 2023-11-01 NOTE — Plan of Care (Signed)
  Problem: Clinical Measurements: Goal: Ability to maintain clinical measurements within normal limits will improve Outcome: Progressing Goal: Will remain free from infection Outcome: Progressing Goal: Diagnostic test results will improve Outcome: Progressing Goal: Respiratory complications will improve Outcome: Progressing Goal: Cardiovascular complication will be avoided Outcome: Progressing   Problem: Activity: Goal: Risk for activity intolerance will decrease Outcome: Progressing   Problem: Nutrition: Goal: Adequate nutrition will be maintained Outcome: Progressing   Problem: Coping: Goal: Level of anxiety will decrease Outcome: Progressing   Problem: Elimination: Goal: Will not experience complications related to bowel motility Outcome: Progressing Goal: Will not experience complications related to urinary retention Outcome: Progressing   Problem: Pain Managment: Goal: General experience of comfort will improve and/or be controlled Outcome: Progressing   Problem: Safety: Goal: Ability to remain free from injury will improve Outcome: Progressing   Problem: Skin Integrity: Goal: Risk for impaired skin integrity will decrease Outcome: Progressing   Problem: Fluid Volume: Goal: Ability to maintain a balanced intake and output will improve Outcome: Progressing   Problem: Metabolic: Goal: Ability to maintain appropriate glucose levels will improve Outcome: Progressing   Problem: Skin Integrity: Goal: Risk for impaired skin integrity will decrease Outcome: Progressing   Problem: Tissue Perfusion: Goal: Adequacy of tissue perfusion will improve Outcome: Progressing   Problem: Cardiac: Goal: Ability to maintain an adequate cardiac output will improve Outcome: Progressing   Problem: Metabolic: Goal: Ability to maintain appropriate glucose levels will improve Outcome: Progressing   Problem: Fluid Volume: Goal: Ability to achieve a balanced intake and  output will improve Outcome: Progressing   Problem: Respiratory: Goal: Will regain and/or maintain adequate ventilation Outcome: Progressing   Problem: Urinary Elimination: Goal: Ability to achieve and maintain adequate renal perfusion and functioning will improve Outcome: Progressing

## 2023-11-01 NOTE — Inpatient Diabetes Management (Signed)
 Inpatient Diabetes Program Recommendations  AACE/ADA: New Consensus Statement on Inpatient Glycemic Control (2015)  Target Ranges:  Prepandial:   less than 140 mg/dL      Peak postprandial:   less than 180 mg/dL (1-2 hours)      Critically ill patients:  140 - 180 mg/dL   Lab Results  Component Value Date   GLUCAP 312 (H) 11/01/2023   HGBA1C 5.6 03/28/2023      Discharge Recommendations: Other recommendations: insulin  pens prn in case of pump failure. will follow up with Endocrinologist. Continue insulin  pump Long acting recommendations: Insulin  Degludec (TRESIBA) FlexTouch Pen back up insulin  dose 27 units  Short acting recommendations:  Correction coverage ONLY Insulin  aspart (NOVOLOG ) FlexPen  Sensitive Scale.     Use Adult Diabetes Insulin  Treatment Post Discharge order set.   Thanks,  Eloise Hake RN, MSN, BC-ADM Inpatient Diabetes Coordinator Team Pager [phone] (8a-5p)

## 2023-11-01 NOTE — Inpatient Diabetes Management (Signed)
 Inpatient Diabetes Program Recommendations  AACE/ADA: New Consensus Statement on Inpatient Glycemic Control (2015)  Target Ranges:  Prepandial:   less than 140 mg/dL      Peak postprandial:   less than 180 mg/dL (1-2 hours)      Critically ill patients:  140 - 180 mg/dL   Lab Results  Component Value Date   GLUCAP 312 (H) 11/01/2023   HGBA1C 5.6 03/28/2023    Review of Glycemic Control  Latest Reference Range & Units 10/31/23 07:53 10/31/23 11:37 10/31/23 16:24 10/31/23 20:00 11/01/23 00:13 11/01/23 01:45 11/01/23 05:32 11/01/23 07:34 11/01/23 11:25  Glucose-Capillary 70 - 99 mg/dL 474 (H) 259 (H) 563 (H) 267 (H) 187 (H) 217 (H) 337 (H) 290 (H) 312 (H)   Diabetes history: Diabetes type 1 (requires basal insulin  and bolus for correction AND meal coverage) Outpatient Diabetes medications:  Tandem MOBI insulin  pump with Novolog  Auto mode Dexcom G7 CGM Pt unsure of pump settings at this time  Current orders for Inpatient glycemic control:  Semglee  20 units bid Novolog  3 units tid meal coverage Novolog  0-9 units Q4 hours  Inpatient Diabetes Program Recommendations:    -   Increase Semglee  to 25 units bid  May also need an increase in meal coverage.    Spoke with pt at bedside pt reports having hyperglycemia at 4 am and bolused with her insulin  pump and fell back asleep. Was unable to decrease glucose levels, noticed site became occluded and turned itself off. Pt reports Seeing Dr. Katina Parlor, Endocrinologist, in [facility] and sees him every 3 months. Her next appointment is scheduled for June. Pt reports using her dads leftover insulin  from 2 years ago when pump fails. Pt will need new back up insulin  in case of pump failure at time of d/c. Reviewed what to do when glucose trends increase. Encouraged pt to formulate a plan in case this happens again with her Endocrinologist. Pt reports having pen needles and syringes at home.   Thanks,  Eloise Hake RN, MSN, BC-ADM Inpatient  Diabetes Coordinator Team Pager [phone] (8a-5p)

## 2023-11-01 NOTE — Progress Notes (Addendum)
 PHARMACY - ANTICOAGULATION CONSULT NOTE  Pharmacy Consult for heparin  Indication: chest pain/ACS  Allergies  Allergen Reactions   Ciprofloxacin  Swelling and Other (See Comments)    Per pt caused lips swell and nauseous feeling   Levaquin [Levofloxacin] Swelling and Other (See Comments)    Per pt caused lips swell and nauseous feeling   Linaclotide Other (See Comments)    Cause severe dehydration   Promethazine  Other (See Comments)    Completely wipes out/fatigue   Buspar [Buspirone] Other (See Comments)    abd cramping   Advair Diskus [Fluticasone -Salmeterol] Other (See Comments)    Thrush    Biaxin [Clarithromycin] Rash   Hydroxyzine Palpitations    Patient Measurements: Height: 5\' 9"  (175.3 cm) Weight: 116.6 kg (257 lb) IBW/kg (Calculated) : 66.2 HEPARIN  DW (KG): 92.9  Vital Signs: Temp: 98.2 F (36.8 C) (05/27 0733) Temp Source: Oral (05/27 0733) BP: 176/56 (05/27 0600) Pulse Rate: 78 (05/27 0600)  Labs: Recent Labs    10/30/23 1318 10/30/23 1333 10/30/23 1351 10/30/23 1516 10/30/23 2147 10/31/23 0135 10/31/23 0800 10/31/23 1210 10/31/23 1515 10/31/23 2256 11/01/23 0427  HGB 8.8*  --  9.9*  --   --  9.1*  --   --   --   --  9.6*  HCT 28.8*  --  29.0*  --   --  26.8*  --   --   --   --  28.9*  PLT 190  --   --   --   --  222  --   --   --   --  250  HEPARINUNFRC  --   --   --   --   --   --    < >  --  0.28* 0.39 0.32  CREATININE  --    < > 3.00*   < > 3.52* 3.59*  --   --   --   --  3.85*  TROPONINIHS 476*  --   --    < >  --  828*  --  381* 364*  --   --    < > = values in this interval not displayed.    Estimated Creatinine Clearance: 23.6 mL/min (A) (by C-G formula based on SCr of 3.85 mg/dL (H)).   Medical History: Past Medical History:  Diagnosis Date   Anemia    Anxiety    Arthritis    Asthma    Balance problems    Bipolar disorder (HCC)    Charcot ankle    Chronic fatigue    Coronary artery disease    Depression    Diabetes mellitus     DKA, type 1 (HCC) 11/04/2011   Dysrhythmia    Elevated cholesterol    ESRD on hemodialysis (HCC)    TTS at [facility]   Fibromyalgia    GERD (gastroesophageal reflux disease)    Headache    History of suicidal ideation    Hyperlipemia    Hypertension    Hypothyroidism    IBS (irritable bowel syndrome)    Memory changes    Obesity    Sleep apnea    wears cpap   Stress incontinence    Pt had surgery to correct this.   Tachycardia    Tobacco abuse    Tremor    UTI (lower urinary tract infection)     Assessment: 53yo female c/o CP and SOB, troponins elevated and rising >> to begin heparin . Troponin increasing , 579> 739> 828.  HL 0.32- therapeutic Troponin 828 > 364 Hgb 9.6- stable  Goal of Therapy:  Heparin  level 0.3-0.7 units/ml Monitor platelets by anticoagulation protocol: Yes   Plan:  Continue heparin  infusion at 1450 units/hr. Monitor heparin  level in and daily  and CBC.  Cliffton Dama, PharmD Clinical Pharmacist 11/01/2023 7:54 AM

## 2023-11-02 ENCOUNTER — Telehealth: Payer: Self-pay

## 2023-11-02 DIAGNOSIS — E1010 Type 1 diabetes mellitus with ketoacidosis without coma: Secondary | ICD-10-CM

## 2023-11-02 DIAGNOSIS — I214 Non-ST elevation (NSTEMI) myocardial infarction: Secondary | ICD-10-CM | POA: Diagnosis not present

## 2023-11-02 DIAGNOSIS — E0810 Diabetes mellitus due to underlying condition with ketoacidosis without coma: Secondary | ICD-10-CM

## 2023-11-02 DIAGNOSIS — T383X6A Underdosing of insulin and oral hypoglycemic [antidiabetic] drugs, initial encounter: Secondary | ICD-10-CM

## 2023-11-02 DIAGNOSIS — T85614A Breakdown (mechanical) of insulin pump, initial encounter: Principal | ICD-10-CM

## 2023-11-02 LAB — GLUCOSE, CAPILLARY
Glucose-Capillary: 128 mg/dL — ABNORMAL HIGH (ref 70–99)
Glucose-Capillary: 158 mg/dL — ABNORMAL HIGH (ref 70–99)
Glucose-Capillary: 160 mg/dL — ABNORMAL HIGH (ref 70–99)
Glucose-Capillary: 165 mg/dL — ABNORMAL HIGH (ref 70–99)
Glucose-Capillary: 187 mg/dL — ABNORMAL HIGH (ref 70–99)
Glucose-Capillary: 215 mg/dL — ABNORMAL HIGH (ref 70–99)
Glucose-Capillary: 98 mg/dL (ref 70–99)

## 2023-11-02 LAB — BASIC METABOLIC PANEL WITH GFR
Anion gap: 9 (ref 5–15)
BUN: 21 mg/dL — ABNORMAL HIGH (ref 6–20)
CO2: 26 mmol/L (ref 22–32)
Calcium: 8.9 mg/dL (ref 8.9–10.3)
Chloride: 97 mmol/L — ABNORMAL LOW (ref 98–111)
Creatinine, Ser: 2.97 mg/dL — ABNORMAL HIGH (ref 0.44–1.00)
GFR, Estimated: 18 mL/min — ABNORMAL LOW
Glucose, Bld: 252 mg/dL — ABNORMAL HIGH (ref 70–99)
Potassium: 3.3 mmol/L — ABNORMAL LOW (ref 3.5–5.1)
Sodium: 132 mmol/L — ABNORMAL LOW (ref 135–145)

## 2023-11-02 LAB — CBC
HCT: 29.6 % — ABNORMAL LOW (ref 36.0–46.0)
Hemoglobin: 9.8 g/dL — ABNORMAL LOW (ref 12.0–15.0)
MCH: 31.2 pg (ref 26.0–34.0)
MCHC: 33.1 g/dL (ref 30.0–36.0)
MCV: 94.3 fL (ref 80.0–100.0)
Platelets: 234 10*3/uL (ref 150–400)
RBC: 3.14 MIL/uL — ABNORMAL LOW (ref 3.87–5.11)
RDW: 12.8 % (ref 11.5–15.5)
WBC: 11.3 10*3/uL — ABNORMAL HIGH (ref 4.0–10.5)
nRBC: 0 % (ref 0.0–0.2)

## 2023-11-02 LAB — MAGNESIUM: Magnesium: 1.6 mg/dL — ABNORMAL LOW (ref 1.7–2.4)

## 2023-11-02 LAB — HEPARIN LEVEL (UNFRACTIONATED): Heparin Unfractionated: 0.59 [IU]/mL (ref 0.30–0.70)

## 2023-11-02 LAB — HEPATITIS B SURFACE ANTIBODY, QUANTITATIVE: Hep B S AB Quant (Post): <3.5 m[IU]/mL — ABNORMAL LOW

## 2023-11-02 MED ORDER — INSULIN GLARGINE-YFGN 100 UNIT/ML ~~LOC~~ SOLN
24.0000 [IU] | Freq: Two times a day (BID) | SUBCUTANEOUS | Status: DC
  Administered 2023-11-02: 24 [IU] via SUBCUTANEOUS
  Filled 2023-11-02 (×4): qty 0.24

## 2023-11-02 MED ORDER — ISOSORBIDE MONONITRATE ER 30 MG PO TB24
30.0000 mg | ORAL_TABLET | Freq: Every day | ORAL | Status: DC
  Administered 2023-11-02 – 2023-11-03 (×2): 30 mg via ORAL
  Filled 2023-11-02 (×2): qty 1

## 2023-11-02 MED ORDER — TORSEMIDE 20 MG PO TABS
40.0000 mg | ORAL_TABLET | Freq: Every day | ORAL | Status: DC
  Administered 2023-11-03: 40 mg via ORAL
  Filled 2023-11-02: qty 2

## 2023-11-02 MED ORDER — PHENOL 1.4 % MT LIQD: 1.0000 | OROMUCOSAL | Status: DC | PRN

## 2023-11-02 NOTE — Progress Notes (Signed)
 Progress Note  Patient Name: Jasmine [name] Date of Encounter: 11/02/2023  Primary Cardiologist: Arun K Thukkani, MD  Interval Summary   Interval chart reviewed.  Patient tolerated hemodialysis/ultrafiltration.  Reports no recurrent chest pain or shortness of breath.  States that she did not sleep well last night.  Vital Signs    Vitals:   11/01/23 2100 11/02/23 0005 11/02/23 0336 11/02/23 0736  BP:  (!) 157/68 (!) 171/61 (!) 177/81  Pulse:  75 79 85  Resp: 17 18 18    Temp:  98.6 F (37 C) 98.2 F (36.8 C) 98.2 F (36.8 C)  TempSrc:  Oral Oral Oral  SpO2:  96% 95% 98%  Weight:      Height:        Intake/Output Summary (Last 24 hours) at 11/02/2023 0956 Last data filed at 11/02/2023 0700 Gross per 24 hour  Intake 1154.03 ml  Output 1500 ml  Net -345.97 ml   Filed Weights   11/01/23 1027 11/01/23 1445 11/01/23 1840  Weight: 116 kg 116.1 kg 114.4 kg    Physical Exam   GEN: No acute distress.   Neck: No JVD. Cardiac: RRR, 2/6 systolic murmur, no gallop.  Respiratory: Nonlabored. Clear to auscultation bilaterally. GI: Soft, nontender, bowel sounds present. MS: No edema.  ECG/Telemetry    Telemetry reviewed showing sinus rhythm.  Labs    Chemistry Recent Labs  Lab 10/30/23 1333 10/30/23 1351 10/31/23 0135 11/01/23 0427 11/02/23 0913  NA 120*   < > 129* 129* 132*  K 5.1   < > 3.6 3.5 3.3*  CL 82*   < > 92* 93* 97*  CO2 20*   < > 25 26 26   GLUCOSE 983*   < > 219* 305* 252*  BUN 29*   < > 33* 41* 21*  CREATININE 3.08*   < > 3.59* 3.85* 2.97*  CALCIUM  8.7*   < > 9.4 9.5 8.9  PROT 6.7  --  6.0*  --   --   ALBUMIN 3.3*  --  3.1*  --   --   AST 21  --  12*  --   --   ALT 13  --  10  --   --   ALKPHOS 134*  --  126  --   --   BILITOT 2.0*  --  0.9  --   --   GFRNONAA 18*   < > 15* 14* 18*  ANIONGAP 18*   < > 12 10 9    < > = values in this interval not displayed.    Hematology Recent Labs  Lab 10/31/23 0135 11/01/23 0427 11/02/23 0453  WBC  10.6* 12.3* 11.3*  RBC 2.92* 3.21* 3.14*  HGB 9.1* 9.6* 9.8*  HCT 26.8* 28.9* 29.6*  MCV 91.8 90.0 94.3  MCH 31.2 29.9 31.2  MCHC 34.0 33.2 33.1  RDW 12.3 12.3 12.8  PLT 222 250 234   Cardiac Enzymes Recent Labs  Lab 10/30/23 1516 10/30/23 2007 10/31/23 0135 10/31/23 1210 10/31/23 1515  TROPONINIHS 579* 739* 828* 381* 364*   Lipid Panel     Component Value Date/Time   CHOL 151 08/15/2023 0000   TRIG 67 08/15/2023 0000   HDL 79 08/15/2023 0000   CHOLHDL 1.9 08/15/2023 0000   CHOLHDL 3.2 09/12/2021 0338   VLDL 28 09/12/2021 0338   LDLCALC 59 08/15/2023 0000   LABVLDL 13 08/15/2023 0000    Cardiac Studies   Echocardiogram 10/31/2023:  1. Distal septal hypokinesis . Left  ventricular ejection fraction, by  estimation, is 55%. The left ventricle has low normal function. The left  ventricle has no regional wall motion abnormalities. Left ventricular  diastolic parameters were normal.   2. Right ventricular systolic function is normal. The right ventricular  size is normal.   3. Left atrial size was moderately dilated.   4. Right atrial size was ? dialysis catheter in RA.   5. The mitral valve is abnormal. Trivial mitral valve regurgitation. No  evidence of mitral stenosis. Moderate mitral annular calcification.   6. The aortic valve is tricuspid. There is moderate calcification of the  aortic valve. There is moderate thickening of the aortic valve. Aortic  valve regurgitation is not visualized. Mild aortic valve stenosis.   7. The inferior vena cava is normal in size with greater than 50%  respiratory variability, suggesting right atrial pressure of 3 mmHg.   Assessment & Plan   1.  NSTEMI, type I versus type II.  Peak high-sensitivity troponin I in the 800s.  No recurrent chest pain.  ECG shows old septal Q waves.  Echocardiogram demonstrates LVEF approximately 55% with distal septal hypokinesis.  She is hemodynamically stable and tolerating medical therapy.   2.   Multivessel CAD status post DES to the first diagonal, DES x 2 to the mid to distal LAD, and DES to the mid RCA in March following an abnormal PET myocardial perfusion study done earlier in the year as part of a renal transplantation evaluation.  She does not describe regular angina at baseline.   3.  ESRD on hemodialysis.   4.  Type 1 diabetes mellitus, fluctuating control on insulin  pump.  Presented with DKA.   5.  Mixed hyperlipidemia, LDL 59 in March on Crestor  10 mg daily.  She has completed 48-hour course of IV heparin , this will be discontinued.  Continue aspirin  81 mg daily, Plavix  75 mg daily, Coreg  12.5 mg twice daily, change from nitroglycerin  paste to Imdur  30 mg daily, and continue Crestor  10 mg daily.  Depending on blood pressure trend, may need to uptitrate hydralazine  to 75 mg 3 times a day.  Increase activity as tolerated.  For questions or updates, please contact [facility] Please consult [email or web address] for contact info under   Signed, Teddie Favre, MD  11/02/2023, 9:56 AM

## 2023-11-02 NOTE — Progress Notes (Addendum)
 PHARMACY - ANTICOAGULATION CONSULT NOTE  Pharmacy Consult for heparin  Indication: chest pain/ACS  Allergies  Allergen Reactions   Ciprofloxacin  Swelling and Other (See Comments)    Per pt caused lips swell and nauseous feeling   Levaquin [Levofloxacin] Swelling and Other (See Comments)    Per pt caused lips swell and nauseous feeling   Linaclotide Other (See Comments)    Cause severe dehydration   Promethazine  Other (See Comments)    Completely wipes out/fatigue   Buspar [Buspirone] Other (See Comments)    abd cramping   Advair Diskus [Fluticasone -Salmeterol] Other (See Comments)    Thrush    Biaxin [Clarithromycin] Rash   Hydroxyzine Palpitations    Patient Measurements: Height: 5\' 9"  (175.3 cm) Weight: 114.4 kg (252 lb 3.3 oz) IBW/kg (Calculated) : 66.2 HEPARIN  DW (KG): 92.9  Vital Signs: Temp: 98.2 F (36.8 C) (05/28 0736) Temp Source: Oral (05/28 0736) BP: 177/81 (05/28 0736) Pulse Rate: 85 (05/28 0736)  Labs: Recent Labs    10/30/23 2147 10/31/23 0135 10/31/23 0800 10/31/23 1210 10/31/23 1515 10/31/23 2256 11/01/23 0427 11/02/23 0453  HGB  --  9.1*  --   --   --   --  9.6* 9.8*  HCT  --  26.8*  --   --   --   --  28.9* 29.6*  PLT  --  222  --   --   --   --  250 234  HEPARINUNFRC  --   --    < >  --  0.28* 0.39 0.32 0.59  CREATININE 3.52* 3.59*  --   --   --   --  3.85*  --   TROPONINIHS  --  828*  --  381* 364*  --   --   --    < > = values in this interval not displayed.    Estimated Creatinine Clearance: 23.3 mL/min (A) (by C-G formula based on SCr of 3.85 mg/dL (H)).   Medical History: Past Medical History:  Diagnosis Date   Anemia    Anxiety    Arthritis    Asthma    Balance problems    Bipolar disorder (HCC)    Charcot ankle    Chronic fatigue    Coronary artery disease    Depression    DKA, type 1 (HCC) 11/04/2011   Elevated cholesterol    ESRD on hemodialysis (HCC)    TTS at [facility]   Fibromyalgia    GERD (gastroesophageal  reflux disease)    Headache    History of suicidal ideation    Hyperlipemia    Hypertension    Hypothyroidism    IBS (irritable bowel syndrome)    Memory changes    Obesity    Sleep apnea    wears cpap   Stress incontinence    Pt had surgery to correct this.   Tobacco abuse    Tremor     Assessment: 53yo female c/o CP and SOB, troponins elevated and rising >> to begin heparin . Troponin increasing , 579> 739> 828.  HL 0.59 - therapeutic Hgb 9.8- stable  Goal of Therapy:  Heparin  level 0.3-0.7 units/ml Monitor platelets by anticoagulation protocol: Yes   Plan:  Continue heparin  infusion at 1450 units/hr. Plan was for 48 hours of therapeutic heparin  which has now passed, will follow up for de-escalation of heparin  or other plans per cardiology  Audra Blend PharmD., BCPS Clinical Pharmacist 11/02/2023 8:00 AM

## 2023-11-02 NOTE — Progress Notes (Signed)
 Patient did not have home CPAP brought in today and refused to use hospital CPAP.

## 2023-11-02 NOTE — Plan of Care (Signed)
  Problem: Education: Goal: Knowledge of General Education information will improve Description: Including pain rating scale, medication(s)/side effects and non-pharmacologic comfort measures Outcome: Progressing   Problem: Clinical Measurements: Goal: Ability to maintain clinical measurements within normal limits will improve Outcome: Progressing Goal: Will remain free from infection Outcome: Progressing   Problem: Activity: Goal: Risk for activity intolerance will decrease Outcome: Progressing   Problem: Elimination: Goal: Will not experience complications related to urinary retention Outcome: Progressing   Problem: Pain Managment: Goal: General experience of comfort will improve and/or be controlled Outcome: Progressing   Problem: Clinical Measurements: Goal: Respiratory complications will improve Outcome: Not Progressing

## 2023-11-02 NOTE — Inpatient Diabetes Management (Addendum)
 Inpatient Diabetes Program Recommendations  AACE/ADA: New Consensus Statement on Inpatient Glycemic Control  Target Ranges:  Prepandial:   less than 140 mg/dL      Peak postprandial:   less than 180 mg/dL (1-2 hours)      Critically ill patients:  140 - 180 mg/dL    Latest Reference Range & Units 11/02/23 00:06 11/02/23 03:35 11/02/23 07:34  Glucose-Capillary 70 - 99 mg/dL 737 (H) 106 (H) 269 (H)    Latest Reference Range & Units 11/01/23 05:32 11/01/23 07:34 11/01/23 11:25 11/01/23 18:53 11/01/23 20:07  Glucose-Capillary 70 - 99 mg/dL 485 (H) 462 (H) 703 (H) 128 (H) 204 (H)   Review of Glycemic Control  Diabetes history: DM1 Outpatient Diabetes medications: Tandem MOBI insulin  pump with Novolog , Dexcom G7 Current orders for Inpatient glycemic control: Semglee  20 units BID, Novolog  0-9 units Q4H, Novolog  3 units TID with meals   Inpatient Diabetes Program Recommendations:    Insulin : If patient remains inpatient, please consider increasing Semglee  to 24 units BID and change Novolog  correction to 0-9 units AC&HS.  Discharge Recommendations: Other recommendations: Recommend pt resume insulin  pump at discharge. Will need Rx for Tresiba and Novolog  Pens at discharge (to have on hand in case she is not using her insulin  pump). Long acting recommendations: Insulin  Degludec (TRESIBA) FlexTouch Pen Tresiba 50 units daily (when not using pump)  Short acting recommendations:  Meal + Correction coverage Insulin  aspart (NOVOLOG ) FlexPen  Sensitive Scale.  Novolog  meal coverage plus correction SQ (when not using insulin  pump) Supply/Referral recommendations: Pen needles - standard   Use Adult Diabetes Insulin  Treatment Post Discharge order set.  Addendum 10/2823@10 :54-Spoke with patient to inquire about pump supplies. Patient states that her husband brought her extra pump supplies to the hospital and she has them for when pump is restarted. Patient states she would prefer to continue to use  SQ insulin  injections while inpatient and will resume her pump on day of discharge. Patient states that she is feeling very tired but overall better. Discussed that it would be recommended to make some changes with SQ insulin  regimen to improve inpatient glycemic control. Discussed that it would also be requested that she be given Rx for Tresiba pens, Novolog  pens, and pen needles at discharge. Patient appreciative of updates and has no questions at this time.  Thanks, Beacher Limerick, RN, MSN, CDCES Diabetes Coordinator Inpatient Diabetes Program [phone] (Team Pager from 8am to 5pm)

## 2023-11-02 NOTE — Progress Notes (Signed)
 Nephrology Follow-Up Consult note   Assessment/Recommendations: Jasmine [name] is a/an 54 y.o. female with a past medical history significant for CHF, CAD, DM2, OSA, bipolar, admitted for DKA complicated by NSTEMI.       ESRD: TTS at [facility].  Continue HD per schedule. Using [facility] while AVF matures  Anemia of CKD: Hemoglobin 9.6.  Iron deplete with IV iron ordered.  Consider ESA  Secondary hyperparathyroidism: Calcium  and phos acceptable.  Continue home sevelamer    DKA/uncontrolled DM2 with hyperglycemia: Insulin  management per primary team  Hypertension: Ultrafiltration as tolerated.  Continue home medications.  Change oral diuretics to daily  NSTEMI: Cardiology evaluated and no plans for [facility] for now. Medical management   Recommendations conveyed to primary service.    Levorn Reason [facility] 11/02/2023 9:01 AM  ___________________________________________________________  CC: Chest pain  Interval History/Subjective: Patient states dialysis went well.  Specifically denies shortness of breath or chest pain.  Tired today.   Medications:  Current Facility-Administered Medications  Medication Dose Route Frequency Provider Last Rate Last Admin   acetaminophen  (TYLENOL ) tablet 1,000 mg  1,000 mg Oral Q6H PRN Segars, Jonathan, MD   1,000 mg at 10/31/23 0004   alteplase  (CATHFLO ACTIVASE ) injection 2 mg  2 mg Intracatheter Once PRN Nan Aver, MD       aspirin  EC tablet 81 mg  81 mg Oral Q breakfast Emokpae, Ejiroghene E, MD   81 mg at 11/02/23 4010   buPROPion  (WELLBUTRIN  XL) 24 hr tablet 150 mg  150 mg Oral Daily Emokpae, Ejiroghene E, MD   150 mg at 11/02/23 0807   calcium  carbonate (TUMS - dosed in mg elemental calcium ) chewable tablet 200 mg of elemental calcium   1 tablet Oral TID WC PRN Justina Oman, MD   200 mg of elemental calcium  at 10/31/23 1344   carvedilol  (COREG ) tablet 12.5 mg  12.5 mg Oral BID WC Emokpae, Ejiroghene E, MD   12.5 mg at  11/02/23 0807   Chlorhexidine  Gluconate Cloth 2 % PADS 6 each  6 each Topical Q0600 Emokpae, Ejiroghene E, MD   6 each at 11/02/23 0541   Chlorhexidine  Gluconate Cloth 2 % PADS 6 each  6 each Topical Q0600 Nan Aver, MD       clopidogrel  (PLAVIX ) tablet 75 mg  75 mg Oral Daily Emokpae, Ejiroghene E, MD   75 mg at 11/02/23 0806   dextrose  50 % solution 0-50 mL  0-50 mL Intravenous PRN Emokpae, Ejiroghene E, MD       DULoxetine  (CYMBALTA ) DR capsule 30 mg  30 mg Oral Daily Emokpae, Ejiroghene E, MD   30 mg at 11/02/23 0806   heparin  ADULT infusion 100 units/mL (25000 units/250mL)  1,450 Units/hr Intravenous Continuous Justina Oman, MD 14.5 mL/hr at 11/02/23 0819 1,450 Units/hr at 11/02/23 0819   heparin  injection 1,000 Units  1,000 Units Intracatheter PRN Nan Aver, MD       hydrALAZINE  (APRESOLINE ) tablet 50 mg  50 mg Oral TID Segars, Jonathan, MD   50 mg at 11/02/23 2725   insulin  aspart (novoLOG ) injection 0-9 Units  0-9 Units Subcutaneous Q4H Emokpae, Ejiroghene E, MD   3 Units at 11/02/23 0805   insulin  aspart (novoLOG ) injection 3 Units  3 Units Subcutaneous TID WC Justina Oman, MD   3 Units at 11/02/23 0805   insulin  glargine-yfgn (SEMGLEE ) injection 20 Units  20 Units Subcutaneous BID Justina Oman, MD   20 Units at 11/01/23 2026   iron sucrose (  VENOFER) 500 mg in sodium chloride  0.9 % 250 mL IVPB  500 mg Intravenous Daily Levorn Reason, MD 78 mL/hr at 11/01/23 1530 500 mg at 11/01/23 1530   labetalol  (NORMODYNE ) injection 10 mg  10 mg Intravenous Q2H PRN Segars, Jonathan, MD   10 mg at 11/01/23 0129   lamoTRIgine  (LAMICTAL ) tablet 200 mg  200 mg Oral QPM Emokpae, Ejiroghene E, MD   200 mg at 11/01/23 1854   levothyroxine  (SYNTHROID ) tablet 175 mcg  175 mcg Oral Q0600 Emokpae, Ejiroghene E, MD   175 mcg at 11/02/23 0503   melatonin tablet 9 mg  9 mg Oral QHS Emokpae, Ejiroghene E, MD   9 mg at 11/01/23 2029   nitroGLYCERIN  (NITROGLYN) 2 % ointment 1 inch  1 inch Topical TID  Segars, Jonathan, MD   1 inch at 11/02/23 0503   ondansetron  (ZOFRAN ) injection 4 mg  4 mg Intravenous Q6H PRN Segars, Jonathan, MD   4 mg at 11/02/23 0507   oxyCODONE  (Oxy IR/ROXICODONE ) immediate release tablet 5 mg  5 mg Oral Once Segars, Jonathan, MD       pantoprazole  (PROTONIX ) EC tablet 40 mg  40 mg Oral Daily Justina Oman, MD   40 mg at 11/02/23 0807   phenol (CHLORASEPTIC) mouth spray 1 spray  1 spray Mouth/Throat PRN Mason Sole, Pratik D, DO       rosuvastatin  (CRESTOR ) tablet 10 mg  10 mg Oral QPM Emokpae, Ejiroghene E, MD   10 mg at 11/01/23 1854   sevelamer  carbonate (RENVELA ) tablet 1,600 mg  1,600 mg Oral TID WC Emokpae, Ejiroghene E, MD   1,600 mg at 11/02/23 0806   torsemide  (DEMADEX ) tablet 40 mg  40 mg Oral Q M,W,F Emokpae, Ejiroghene E, MD   40 mg at 11/02/23 0809      Review of Systems: 10 systems reviewed and negative except per interval history/subjective  Physical Exam: Vitals:   11/02/23 0336 11/02/23 0736  BP: (!) 171/61 (!) 177/81  Pulse: 79 85  Resp: 18   Temp: 98.2 F (36.8 C) 98.2 F (36.8 C)  SpO2: 95% 98%   No intake/output data recorded.  Intake/Output Summary (Last 24 hours) at 11/02/2023 0901 Last data filed at 11/02/2023 0700 Gross per 24 hour  Intake 1154.03 ml  Output 1500 ml  Net -345.97 ml   Constitutional: tired-appearing, no acute distress ENMT: ears and nose without scars or lesions, MMM CV: normal rate, trace edema Respiratory: Bilateral chest rise, normal work of breathing Gastrointestinal: soft, non-tender, no palpable masses or hernias Skin: no visible lesions or rashes Psych: alert, appropriate mood and affect   Test Results I personally reviewed new and old clinical labs and radiology tests Lab Results  Component Value Date   NA 129 (L) 11/01/2023   K 3.5 11/01/2023   CL 93 (L) 11/01/2023   CO2 26 11/01/2023   BUN 41 (H) 11/01/2023   CREATININE 3.85 (H) 11/01/2023   GFR 29.09 (L) 03/24/2020   CALCIUM  9.5 11/01/2023    ALBUMIN 3.1 (L) 10/31/2023   PHOS 4.2 11/01/2023    CBC Recent Labs  Lab 10/31/23 0135 11/01/23 0427 11/02/23 0453  WBC 10.6* 12.3* 11.3*  HGB 9.1* 9.6* 9.8*  HCT 26.8* 28.9* 29.6*  MCV 91.8 90.0 94.3  PLT 222 250 234

## 2023-11-02 NOTE — Telephone Encounter (Signed)
 Called to see if the insurance received fax with compliance info

## 2023-11-02 NOTE — Progress Notes (Signed)
 PROGRESS NOTE    Jasmine [name]  WUJ:[id number] DOB: Jul 08, 1970 DOA: 10/30/2023 PCP: Lory Rough., PA-C   Brief Narrative:    Jasmine [name] is a 53 y.o. female with medical history significant for ESRD on dialysis Tuesday Thursday Saturday, coronary artery disease, CHF with reduced EF, diabetes mellitus, OSA on CPAP, bipolar disorder. Patient presented to the ED with reports of high blood sugars.  Patient reports she checked her blood sugars this morning and it was in the 400s, and then she realized that her insulin  pump was not working.  Recent hospitalization 3/31 to 4/6 for sepsis secondary to MRSA bacteremia, MRSA right thigh abscess with I&D done.  Underwent TEE- 4/3 which was negative for vegetations.   Patient was admitted with DKA as well as NSTEMI.  Her blood glucose levels have improved and DKA has resolved.  Per cardiology, she has completed 48-hour course of heparin  drip and is now on oral medications.  She is receiving hemodialysis per nephrology with last session 5/27.  Assessment & Plan:   Principal Problem:   DKA (diabetic ketoacidosis) (HCC) Active Problems:   Pseudohyponatremia   Chest pain   Essential hypertension   OSA on CPAP   Bipolar 2 disorder, major depressive episode (HCC)   Chronic respiratory failure with hypoxia (HCC)   CAD (coronary artery disease)   End-stage renal disease on hemodialysis (HCC)   Chronic HFrEF (heart failure with reduced ejection fraction) (HCC)  Assessment and Plan:  End-stage renal disease on hemodialysis (HCC) -On dialysis Tuesday Thursday Saturday, last dialysis session was on 10/29/2023 - Nephrology service has been consulted to assist with dialysis management while hospitalized - Planning for hemodialysis 5/29 - Patient is still making urine and chronically on diuretics - Will continue to follow electrolytes and volume status.   -Heart healthy/low-sodium and adequate hydration discussed with patient. -IV iron  per nephrology for anemia   DKA (diabetic ketoacidosis) (HCC)-resolved -Blood sugar 983, serum bicarb 20, BHB 3.88.  VBG shows pH of 7.34.  Sodium- 120, anion gap 18.  - Adequate response to insulin  drip and fluid resuscitation - Patient has been started on adjusted dose of Semglee , sliding scale insulin  and meal coverage - Adjust Semglee  to 24 units twice daily and continue NovoLog  correction -Appreciate diabetes coordinator recommendations   Bipolar 2 disorder, major depressive episode (HCC) -No suicidal ideation or hallucination - Continue the use of Lamictal , Cymbalta  and bupropion    Chest pain/demand ischemia/NSTEMI -Hx of CAD, with recent DES X 4 - 08/30/23.  - Elevated troponins appreciated at time of admission and rising. - Case discussed with cardiology service who recommended echocardiogram.  -Patient's 2D echo demonstrating distal hypokinesia; ruling her in for NSTEMI. - Patient has completed 48-hour course of IV heparin  -Continue medications per cardiology recommendations -Still noted to have some elevated blood pressure readings and may need to uptrend hydralazine  dosing   Chronic respiratory failure with hypoxia (HCC) -Chronic hypoxic respiratory failure + OSA on CPAP- on 3 L O2 at night only. - Will continue home regimen. - Good saturation appreciated on room air.   Essential hypertension-elevated - Appreciate cardiology recommendations   Pseudohyponatremia -In the setting of hyperglycemia - Resolved after stabilizing blood sugar levels - Continue to follow electrolytes.   Class II obesity - Low-calorie diet and portion control discussed with patient -Body mass index is 37.77 kg/m.     DVT prophylaxis:Heparin  drip dc 5/28 Code Status: Full Family Communication: None at bedside Disposition Plan:  Status is: Inpatient Remains  inpatient appropriate because: Need for IV medications.   Consultants:  Cardiology Nephrology  Procedures:   None  Antimicrobials:  None  Subjective: Patient seen and evaluated today with complaints of some sore throat and has significant lightheadedness and dizziness with ambulation.  Continues to feel tired and not ready for discharge.  No acute overnight events noted.  Objective: Vitals:   11/02/23 0005 11/02/23 0336 11/02/23 0736 11/02/23 1206  BP: (!) 157/68 (!) 171/61 (!) 177/81 (!) 155/73  Pulse: 75 79 85 79  Resp: 18 18    Temp: 98.6 F (37 C) 98.2 F (36.8 C) 98.2 F (36.8 C) 98.4 F (36.9 C)  TempSrc: Oral Oral Oral Oral  SpO2: 96% 95% 98% 91%  Weight:      Height:        Intake/Output Summary (Last 24 hours) at 11/02/2023 1227 Last data filed at 11/02/2023 0700 Gross per 24 hour  Intake 1154.03 ml  Output 1500 ml  Net -345.97 ml   Filed Weights   11/01/23 1027 11/01/23 1445 11/01/23 1840  Weight: 116 kg 116.1 kg 114.4 kg    Examination:  General exam: Appears calm and comfortable, obese Respiratory system: Clear to auscultation. Respiratory effort normal.  Left-sided TDC catheter C/D/I Cardiovascular system: S1 & S2 heard, RRR.  Gastrointestinal system: Abdomen is soft Central nervous system: Alert and awake Extremities: No edema Skin: No significant lesions noted Psychiatry: Flat affect.    Data Reviewed: I have personally reviewed following labs and imaging studies  CBC: Recent Labs  Lab 10/30/23 1318 10/30/23 1351 10/31/23 0135 11/01/23 0427 11/02/23 0453  WBC 5.6  --  10.6* 12.3* 11.3*  HGB 8.8* 9.9* 9.1* 9.6* 9.8*  HCT 28.8* 29.0* 26.8* 28.9* 29.6*  MCV 97.6  --  91.8 90.0 94.3  PLT 190  --  222 250 234   Basic Metabolic Panel: Recent Labs  Lab 10/30/23 1731 10/30/23 2147 10/31/23 0135 11/01/23 0427 11/02/23 0913  NA 124* 129* 129* 129* 132*  K 3.6 3.4* 3.6 3.5 3.3*  CL 85* 93* 92* 93* 97*  CO2 25 27 25 26 26   GLUCOSE 706* 212* 219* 305* 252*  BUN 35* 33* 33* 41* 21*  CREATININE 3.39* 3.52* 3.59* 3.85* 2.97*  CALCIUM  9.1 9.5  9.4 9.5 8.9  MG  --   --   --   --  1.6*  PHOS  --   --   --  4.2  --    GFR: Estimated Creatinine Clearance: 30.2 mL/min (A) (by C-G formula based on SCr of 2.97 mg/dL (H)). Liver Function Tests: Recent Labs  Lab 10/30/23 1333 10/31/23 0135  AST 21 12*  ALT 13 10  ALKPHOS 134* 126  BILITOT 2.0* 0.9  PROT 6.7 6.0*  ALBUMIN 3.3* 3.1*   No results for input(s): "LIPASE", "AMYLASE" in the last 168 hours. No results for input(s): "AMMONIA" in the last 168 hours. Coagulation Profile: No results for input(s): "INR", "PROTIME" in the last 168 hours. Cardiac Enzymes: No results for input(s): "CKTOTAL", "CKMB", "CKMBINDEX", "TROPONINI" in the last 168 hours. BNP (last 3 results) No results for input(s): "PROBNP" in the last 8760 hours. HbA1C: No results for input(s): "HGBA1C" in the last 72 hours. CBG: Recent Labs  Lab 11/01/23 2007 11/02/23 0006 11/02/23 0335 11/02/23 0734 11/02/23 1203  GLUCAP 204* 128* 160* 215* 165*   Lipid Profile: No results for input(s): "CHOL", "HDL", "LDLCALC", "TRIG", "CHOLHDL", "LDLDIRECT" in the last 72 hours. Thyroid  Function Tests: No results for input(s): "TSH", "  T4TOTAL", "FREET4", "T3FREE", "THYROIDAB" in the last 72 hours. Anemia Panel: Recent Labs    11/01/23 0427  FERRITIN 196  TIBC 306  IRON 46   Sepsis Labs: No results for input(s): "PROCALCITON", "LATICACIDVEN" in the last 168 hours.  Recent Results (from the past 240 hours)  MRSA Next Gen by PCR, Nasal     Status: None   Collection Time: 10/30/23  4:12 PM   Specimen: Nasal Mucosa; Nasal Swab  Result Value Ref Range Status   MRSA by PCR Next Gen NOT DETECTED NOT DETECTED Final    Comment: (NOTE) The GeneXpert MRSA Assay (FDA approved for NASAL specimens only), is one component of a comprehensive MRSA colonization surveillance program. It is not intended to diagnose MRSA infection nor to guide or monitor treatment for MRSA infections. Test performance is not FDA approved in  patients less than 65 years old. Performed at [facility], [address], [place], [place] [address]          Radiology Studies: No results found.      Scheduled Meds:  aspirin  EC  81 mg Oral Q breakfast   buPROPion   150 mg Oral Daily   carvedilol   12.5 mg Oral BID WC   Chlorhexidine  Gluconate Cloth  6 each Topical Q0600   Chlorhexidine  Gluconate Cloth  6 each Topical Q0600   clopidogrel   75 mg Oral Daily   DULoxetine   30 mg Oral Daily   hydrALAZINE   50 mg Oral TID   insulin  aspart  0-9 Units Subcutaneous Q4H   insulin  aspart  3 Units Subcutaneous TID WC   insulin  glargine-yfgn  24 Units Subcutaneous BID   isosorbide mononitrate  30 mg Oral Daily   lamoTRIgine   200 mg Oral QPM   levothyroxine   175 mcg Oral Q0600   melatonin  9 mg Oral QHS   oxyCODONE   5 mg Oral Once   pantoprazole   40 mg Oral Daily   rosuvastatin   10 mg Oral QPM   sevelamer  carbonate  1,600 mg Oral TID WC   [START ON 11/03/2023] torsemide   40 mg Oral Daily   Continuous Infusions:  iron sucrose 500 mg (11/01/23 1530)     LOS: 3 days    Time spent: 55 minutes    Kooper Godshall D Mason Sole, DO [facility]  If 7PM-7AM, please contact night-coverage [email or web address] 11/02/2023, 12:27 PM

## 2023-11-03 DIAGNOSIS — I10 Essential (primary) hypertension: Secondary | ICD-10-CM

## 2023-11-03 DIAGNOSIS — I214 Non-ST elevation (NSTEMI) myocardial infarction: Secondary | ICD-10-CM | POA: Diagnosis not present

## 2023-11-03 DIAGNOSIS — E0810 Diabetes mellitus due to underlying condition with ketoacidosis without coma: Secondary | ICD-10-CM | POA: Diagnosis not present

## 2023-11-03 LAB — BASIC METABOLIC PANEL WITH GFR
Anion gap: 9 (ref 5–15)
BUN: 25 mg/dL — ABNORMAL HIGH (ref 6–20)
CO2: 25 mmol/L (ref 22–32)
Calcium: 9 mg/dL (ref 8.9–10.3)
Chloride: 99 mmol/L (ref 98–111)
Creatinine, Ser: 3.93 mg/dL — ABNORMAL HIGH (ref 0.44–1.00)
GFR, Estimated: 13 mL/min — ABNORMAL LOW (ref >60)
Glucose, Bld: 215 mg/dL — ABNORMAL HIGH (ref 70–99)
Potassium: 3.3 mmol/L — ABNORMAL LOW (ref 3.5–5.1)
Sodium: 133 mmol/L — ABNORMAL LOW (ref 135–145)

## 2023-11-03 LAB — GLUCOSE, CAPILLARY
Glucose-Capillary: 190 mg/dL — ABNORMAL HIGH (ref 70–99)
Glucose-Capillary: 198 mg/dL — ABNORMAL HIGH (ref 70–99)

## 2023-11-03 LAB — CBC
HCT: 29.6 % — ABNORMAL LOW (ref 36.0–46.0)
Hemoglobin: 9.4 g/dL — ABNORMAL LOW (ref 12.0–15.0)
MCH: 29.7 pg (ref 26.0–34.0)
MCHC: 31.8 g/dL (ref 30.0–36.0)
MCV: 93.4 fL (ref 80.0–100.0)
Platelets: 221 10*3/uL (ref 150–400)
RBC: 3.17 MIL/uL — ABNORMAL LOW (ref 3.87–5.11)
RDW: 12.8 % (ref 11.5–15.5)
WBC: 9.3 10*3/uL (ref 4.0–10.5)
nRBC: 0 % (ref 0.0–0.2)

## 2023-11-03 LAB — MAGNESIUM: Magnesium: 1.7 mg/dL (ref 1.7–2.4)

## 2023-11-03 LAB — HEPARIN LEVEL (UNFRACTIONATED): Heparin Unfractionated: <0.1 [IU]/mL — ABNORMAL LOW (ref 0.30–0.70)

## 2023-11-03 MED ORDER — ISOSORBIDE MONONITRATE ER 30 MG PO TB24: 30.0000 mg | ORAL_TABLET | Freq: Every day | ORAL | 1 refills | Status: DC

## 2023-11-03 MED ORDER — HEPARIN SODIUM (PORCINE) 1000 UNIT/ML IJ SOLN
INTRAMUSCULAR | Status: AC
  Filled 2023-11-03: qty 4

## 2023-11-03 MED ORDER — PEN NEEDLES 31G X 5 MM MISC: 1.0000 | Freq: Three times a day (TID) | 0 refills | Status: AC

## 2023-11-03 MED ORDER — HYDRALAZINE HCL 50 MG PO TABS: 75.0000 mg | ORAL_TABLET | Freq: Three times a day (TID) | ORAL | Status: DC

## 2023-11-03 MED ORDER — BLOOD GLUCOSE TEST VI STRP: 1.0000 | ORAL_STRIP | Freq: Three times a day (TID) | 0 refills | Status: AC

## 2023-11-03 MED ORDER — LANCETS MISC: 1.0000 | Freq: Three times a day (TID) | 0 refills | Status: AC

## 2023-11-03 MED ORDER — INSULIN ASPART 100 UNIT/ML FLEXPEN: PEN_INJECTOR | SUBCUTANEOUS | 0 refills | Status: AC

## 2023-11-03 MED ORDER — INSULIN DEGLUDEC 100 UNIT/ML ~~LOC~~ SOPN: 50.0000 [IU] | PEN_INJECTOR | Freq: Every day | SUBCUTANEOUS | 0 refills | Status: AC

## 2023-11-03 MED ORDER — LANCET DEVICE MISC: 1.0000 | Freq: Three times a day (TID) | 0 refills | Status: AC

## 2023-11-03 MED ORDER — HYDRALAZINE HCL 25 MG PO TABS: 75.0000 mg | ORAL_TABLET | Freq: Three times a day (TID) | ORAL | 1 refills | Status: DC

## 2023-11-03 MED ORDER — BLOOD GLUCOSE MONITORING SUPPL DEVI: 1.0000 | Freq: Three times a day (TID) | 0 refills | Status: AC

## 2023-11-03 NOTE — Progress Notes (Signed)
 Nephrology Follow-Up Consult note   Assessment/Recommendations: Jasmine [name] is a/an 53 y.o. female with a past medical history significant for CHF, CAD, DM2, OSA, bipolar, admitted for DKA complicated by NSTEMI.       ESRD: TTS at [facility].  Continue HD per schedule. Using [facility] while AVF matures  Anemia of CKD: Hemoglobin 9s.  Iron deplete and IV iron given.  Consider ESA if she remains inpatient  Secondary hyperparathyroidism: Calcium  and phos acceptable.  Continue home sevelamer    DKA/uncontrolled DM2 with hyperglycemia: Insulin  management per primary team  Hypertension: Ultrafiltration as tolerated.  Continue home medications.  Changed oral diuretics to daily  NSTEMI: Cardiology evaluated and no plans for [facility] for now. Medical management  Stable for DC from nephrology perspective after HD today   Recommendations conveyed to primary service.    Levorn Reason [facility] 11/03/2023 8:31 AM  ___________________________________________________________  CC: Chest pain  Interval History/Subjective: Patient feels well with no complaints.   Medications:  Current Facility-Administered Medications  Medication Dose Route Frequency Provider Last Rate Last Admin   acetaminophen  (TYLENOL ) tablet 1,000 mg  1,000 mg Oral Q6H PRN Segars, Jonathan, MD   1,000 mg at 11/02/23 1819   alteplase  (CATHFLO ACTIVASE ) injection 2 mg  2 mg Intracatheter Once PRN Nan Aver, MD       aspirin  EC tablet 81 mg  81 mg Oral Q breakfast Emokpae, Ejiroghene E, MD   81 mg at 11/02/23 4098   buPROPion  (WELLBUTRIN  XL) 24 hr tablet 150 mg  150 mg Oral Daily Emokpae, Ejiroghene E, MD   150 mg at 11/02/23 0807   calcium  carbonate (TUMS - dosed in mg elemental calcium ) chewable tablet 200 mg of elemental calcium   1 tablet Oral TID WC PRN Justina Oman, MD   200 mg of elemental calcium  at 10/31/23 1344   carvedilol  (COREG ) tablet 12.5 mg  12.5 mg Oral BID WC Emokpae, Ejiroghene E, MD    12.5 mg at 11/02/23 1714   Chlorhexidine  Gluconate Cloth 2 % PADS 6 each  6 each Topical Q0600 Emokpae, Ejiroghene E, MD   6 each at 11/03/23 0639   Chlorhexidine  Gluconate Cloth 2 % PADS 6 each  6 each Topical Q0600 Nan Aver, MD   6 each at 11/03/23 534 119 5620   clopidogrel  (PLAVIX ) tablet 75 mg  75 mg Oral Daily Emokpae, Ejiroghene E, MD   75 mg at 11/02/23 4782   dextrose  50 % solution 0-50 mL  0-50 mL Intravenous PRN Emokpae, Ejiroghene E, MD       DULoxetine  (CYMBALTA ) DR capsule 30 mg  30 mg Oral Daily Emokpae, Ejiroghene E, MD   30 mg at 11/02/23 0806   heparin  injection 1,000 Units  1,000 Units Intracatheter PRN Nan Aver, MD       hydrALAZINE  (APRESOLINE ) tablet 75 mg  75 mg Oral TID Mason Sole, Pratik D, DO       insulin  aspart (novoLOG ) injection 0-9 Units  0-9 Units Subcutaneous Q4H Emokpae, Ejiroghene E, MD   2 Units at 11/03/23 0449   insulin  aspart (novoLOG ) injection 3 Units  3 Units Subcutaneous TID WC Justina Oman, MD   3 Units at 11/02/23 1231   insulin  glargine-yfgn (SEMGLEE ) injection 24 Units  24 Units Subcutaneous BID Shah, Pratik D, DO   24 Units at 11/02/23 2132   isosorbide mononitrate (IMDUR) 24 hr tablet 30 mg  30 mg Oral Daily Gerard Knight, MD   30 mg at 11/02/23 1208  labetalol  (NORMODYNE ) injection 10 mg  10 mg Intravenous Q2H PRN Segars, Jonathan, MD   10 mg at 11/01/23 0129   lamoTRIgine  (LAMICTAL ) tablet 200 mg  200 mg Oral QPM Emokpae, Ejiroghene E, MD   200 mg at 11/02/23 1721   levothyroxine  (SYNTHROID ) tablet 175 mcg  175 mcg Oral Q0600 Emokpae, Ejiroghene E, MD   175 mcg at 11/03/23 0449   melatonin tablet 9 mg  9 mg Oral QHS Emokpae, Ejiroghene E, MD   9 mg at 11/02/23 2132   ondansetron  (ZOFRAN ) injection 4 mg  4 mg Intravenous Q6H PRN Arnulfo Larch, MD   4 mg at 11/02/23 0507   oxyCODONE  (Oxy IR/ROXICODONE ) immediate release tablet 5 mg  5 mg Oral Once Segars, Jonathan, MD       pantoprazole  (PROTONIX ) EC tablet 40 mg  40 mg Oral Daily Justina Oman, MD   40 mg at 11/02/23 0807   phenol (CHLORASEPTIC) mouth spray 1 spray  1 spray Mouth/Throat PRN Mason Sole, Pratik D, DO       rosuvastatin  (CRESTOR ) tablet 10 mg  10 mg Oral QPM Emokpae, Ejiroghene E, MD   10 mg at 11/02/23 1721   sevelamer  carbonate (RENVELA ) tablet 1,600 mg  1,600 mg Oral TID WC Emokpae, Ejiroghene E, MD   1,600 mg at 11/02/23 1820   torsemide  (DEMADEX ) tablet 40 mg  40 mg Oral Daily Levorn Reason, MD          Review of Systems: 10 systems reviewed and negative except per interval history/subjective  Physical Exam: Vitals:   11/03/23 0459 11/03/23 0729  BP: (!) 161/66 (!) 174/83  Pulse:  93  Resp:  16  Temp:  99.3 F (37.4 C)  SpO2:  93%   No intake/output data recorded.  Intake/Output Summary (Last 24 hours) at 11/03/2023 0831 Last data filed at 11/02/2023 2134 Gross per 24 hour  Intake 240 ml  Output --  Net 240 ml   Constitutional: well-appearing, no acute distress ENMT: ears and nose without scars or lesions, MMM CV: normal rate, no edema Respiratory: Bilateral chest rise, normal work of breathing Gastrointestinal: soft, non-tender, no palpable masses or hernias Skin: no visible lesions or rashes Psych: alert, appropriate mood and affect   Test Results I personally reviewed new and old clinical labs and radiology tests Lab Results  Component Value Date   NA 133 (L) 11/03/2023   K 3.3 (L) 11/03/2023   CL 99 11/03/2023   CO2 25 11/03/2023   BUN 25 (H) 11/03/2023   CREATININE 3.93 (H) 11/03/2023   GFR 29.09 (L) 03/24/2020   CALCIUM  9.0 11/03/2023   ALBUMIN 3.1 (L) 10/31/2023   PHOS 4.2 11/01/2023    CBC Recent Labs  Lab 11/01/23 0427 11/02/23 0453 11/03/23 0453  WBC 12.3* 11.3* 9.3  HGB 9.6* 9.8* 9.4*  HCT 28.9* 29.6* 29.6*  MCV 90.0 94.3 93.4  PLT 250 234 221

## 2023-11-03 NOTE — TOC Transition Note (Signed)
 Transition of Care [facility]) - Discharge Note   Patient Details  Name: Jasmine [name] MRN: [id number] Date of Birth: 1970-12-20  [facility] CM/SW Contact:  Geraldina Klinefelter, RN Phone Number: 11/03/2023, 11:21 AM   Clinical Narrative:    Pt will dc home once dialysis is complete. TOC signing off.   Barriers to Discharge: Continued Medical Work up   Patient Goals and CMS Choice Patient states their goals for this hospitalization and ongoing recovery are:: return home   Choice offered to / list presented to : Patient  ownership interest in [facility].provided to::  (n/a)    Discharge Placement                       Discharge Plan and Services Additional resources added to the After Visit Summary for   In-house Referral: Clinical Social Work                                   Social Drivers of Health (SDOH) Interventions SDOH Screenings   Food Insecurity: No Food Insecurity (10/30/2023)  Housing: Low Risk  (10/30/2023)  Transportation Needs: No Transportation Needs (10/30/2023)  Utilities: Not At Risk (10/30/2023)  Alcohol Screen: Low Risk  (08/30/2018)  Depression (PHQ2-9): High Risk (12/14/2021)  Financial Resource Strain: Low Risk  (12/22/2022)   [facility]  Physical Activity: Inactive (01/23/2019)  Social Connections: Unknown (10/19/2021)   [facility] from [facility]  Stress: Stress Concern Present (01/23/2019)  Tobacco Use: Medium Risk (10/30/2023)     Readmission Risk Interventions    10/31/2023    9:11 AM 09/06/2023    8:18 AM  Readmission Risk Prevention Plan  Transportation Screening Complete Complete  HRI or Home Care Consult  Complete  Social Work Consult for Recovery Care Planning/Counseling  Complete  Palliative Care Screening  Not Applicable  Medication Review Oceanographer) Complete Complete  HRI or Home Care Consult Complete   SW Recovery Care/Counseling Consult Complete    Palliative Care Screening Not Applicable   Skilled Nursing Facility Not Applicable

## 2023-11-03 NOTE — Plan of Care (Signed)
  Problem: Education: Goal: Knowledge of General Education information will improve Description: Including pain rating scale, medication(s)/side effects and non-pharmacologic comfort measures Outcome: Progressing   Problem: Clinical Measurements: Goal: Respiratory complications will improve Outcome: Progressing Goal: Cardiovascular complication will be avoided Outcome: Progressing   Problem: Activity: Goal: Risk for activity intolerance will decrease Outcome: Progressing   Problem: Nutrition: Goal: Adequate nutrition will be maintained Outcome: Progressing   Problem: Pain Managment: Goal: General experience of comfort will improve and/or be controlled Outcome: Progressing

## 2023-11-03 NOTE — Discharge Summary (Signed)
 Physician Discharge Summary  Jasmine [name] UJW:[id number] DOB: 1971/04/15 DOA: 10/30/2023  PCP: Lory Rough., PA-C  Admit date: 10/30/2023  Discharge date: 11/03/2023  Admitted From: Home  Disposition: Home  Recommendations for Outpatient Follow-up:  Follow up with PCP in 1-2 weeks Follow-up with cardiology as scheduled on 6/16 Continue on medications as noted below Insulin  pens with Tresiba  and NovoLog  prescribed as needed for pump failure Follow-up with endocrinology which is coming up within the next month Can any routine hemodialysis with last session on day of discharge 5/29  Home Health: None  Equipment/Devices: None  Discharge Condition:Stable  CODE STATUS: Full  Diet recommendation: Heart Healthy/carb modified  Brief/Interim Summary: Jasmine [name] is a 53 y.o. female with medical history significant for ESRD on dialysis Tuesday Thursday Saturday, coronary artery disease, CHF with reduced EF, diabetes mellitus, OSA on CPAP, bipolar disorder. Patient presented to the ED with reports of high blood sugars.  Patient reports she checked her blood sugars this morning and it was in the 400s, and then she realized that her insulin  pump was not working.   Recent hospitalization 3/31 to 4/6 for sepsis secondary to MRSA bacteremia, MRSA right thigh abscess with I&D done.  Underwent TEE- 4/3 which was negative for vegetations.    Patient was admitted with DKA as well as NSTEMI.  Her blood glucose levels have improved and DKA has resolved.  Per cardiology, she has completed 48-hour course of heparin  drip and is now on oral medications.  She has completed hemodialysis on the day of discharge and will need to continue on her usual routine outpatient.  She has been prescribed insulin  pens per diabetes coordinator recommendations to use in case of pump failure.  She will have outpatient follow-up with cardiology as noted above.  No other acute events or concerns  noted.  Discharge Diagnoses:  Principal Problem:   DKA (diabetic ketoacidosis) (HCC) Active Problems:   Pseudohyponatremia   Chest pain   Essential hypertension   OSA on CPAP   Bipolar 2 disorder, major depressive episode (HCC)   Chronic respiratory failure with hypoxia (HCC)   CAD (coronary artery disease)   End-stage renal disease on hemodialysis (HCC)   Chronic HFrEF (heart failure with reduced ejection fraction) (HCC)   Non-ST elevation (NSTEMI) myocardial infarction [facility])  Principal discharge diagnosis: DKA in the setting of insulin  pump failure as well as NSTEMI.  Discharge Instructions  Discharge Instructions     Diet - low sodium heart healthy   Complete by: As directed    Increase activity slowly   Complete by: As directed    No wound care   Complete by: As directed       Allergies as of 11/03/2023       Reactions   Ciprofloxacin  Swelling, Other (See Comments)   Per pt caused lips swell and nauseous feeling   Levaquin [levofloxacin] Swelling, Other (See Comments)   Per pt caused lips swell and nauseous feeling   Linaclotide Other (See Comments)   Cause severe dehydration   Promethazine  Other (See Comments)   Completely wipes out/fatigue   Buspar [buspirone] Other (See Comments)   abd cramping   Advair Diskus [fluticasone -salmeterol] Other (See Comments)   Thrush    Biaxin [clarithromycin] Rash   Hydroxyzine Palpitations        Medication List     STOP taking these medications    Levemir  100 UNIT/ML injection Generic drug: insulin  detemir   predniSONE 10 MG tablet Commonly known as:  DELTASONE       TAKE these medications    acetaminophen  500 MG tablet Commonly known as: TYLENOL  Take 500-1,000 mg by mouth every 6 (six) hours as needed (pain.).   aspirin  EC 81 MG tablet Take 1 tablet (81 mg total) by mouth daily with breakfast. Swallow whole.   azelastine  0.1 % nasal spray Commonly known as: ASTELIN  Place 2 sprays into both nostrils 2  (two) times daily. Use in each nostril as directed What changed:  when to take this reasons to take this additional instructions   Blood Glucose Monitoring Suppl Devi 1 each by Does not apply route 3 (three) times daily. May dispense any manufacturer covered by patient's insurance.   BLOOD GLUCOSE TEST STRIPS Strp 1 each by Does not apply route 3 (three) times daily. Use as directed to check blood sugar. May dispense any manufacturer covered by patient's insurance and fits patient's device.   buPROPion  150 MG 24 hr tablet Commonly known as: WELLBUTRIN  XL Take 1 tablet (150 mg total) by mouth daily.   carvedilol  12.5 MG tablet Commonly known as: COREG  Take 12.5 mg by mouth 2 (two) times daily with a meal.   clopidogrel  75 MG tablet Commonly known as: PLAVIX  Take 1 tablet (75 mg total) by mouth daily.   Continuous Blood Gluc Sensor Misc 1 each by Does not apply route as directed. Use as directed every 14 days. May dispense FreeStyle [employer] or similar.   diclofenac  Sodium 1 % Gel Commonly known as: VOLTAREN  Apply 2 g topically 4 (four) times daily as needed (pain.).   DULoxetine  30 MG capsule Commonly known as: CYMBALTA  Take 1 capsule (30 mg total) by mouth daily.   folic acid  800 MCG tablet Commonly known as: FOLVITE  Take 800 mcg by mouth in the morning.   GlucaGen  HypoKit 1 MG Solr injection Generic drug: glucagon Inject 1 mg into the skin once as needed for up to 1 dose for low blood sugar. GlucaGen  HypoKit 1 mg Injection   hydrALAZINE  25 MG tablet Commonly known as: APRESOLINE  Take 3 tablets (75 mg total) by mouth 3 (three) times daily. What changed:  how much to take when to take this   HYDROcodone -acetaminophen  5-325 MG tablet Commonly known as: NORCO/VICODIN Take 1 tablet by mouth every 6 (six) hours as needed for moderate pain (pain score 4-6).   insulin  degludec 100 UNIT/ML FlexTouch Pen Commonly known as: TRESIBA  Inject 50 Units into the  skin daily. May substitute as needed per insurance. When not using pump.   isosorbide  mononitrate 30 MG 24 hr tablet Commonly known as: IMDUR  Take 1 tablet (30 mg total) by mouth daily. Start taking on: Nov 04, 2023   lamoTRIgine  200 MG tablet Commonly known as: LAMICTAL  Take 1 tablet (200 mg total) by mouth every evening.   Lancet Device Misc 1 each by Does not apply route 3 (three) times daily. May dispense any manufacturer covered by patient's insurance.   Lancets Misc 1 each by Does not apply route 3 (three) times daily. Use as directed to check blood sugar. May dispense any manufacturer covered by patient's insurance and fits patient's device.   levothyroxine  175 MCG tablet Commonly known as: SYNTHROID  Take 1.5 tablets (mcg) by mouth on Sundays in the mornings & take 1 tablet (175 mcg) by mouth on all other days.   LORazepam  0.5 MG tablet Commonly known as: ATIVAN  Take 1 tablet (0.5 mg total) by mouth daily as needed for anxiety. Start taking on: November 11, 2023   Melatonin 10 MG Tabs Take 10 mg by mouth at bedtime.   NovoLOG  100 UNIT/ML injection Generic drug: insulin  aspart Inject into the skin continuous. Sliding Scale Insulin  pump 2.5 units basal Bolus with meal depending on the size What changed: Another medication with the same name was added. Make sure you understand how and when to take each.   insulin  aspart 100 UNIT/ML FlexPen Commonly known as: NOVOLOG  If eating and Blood Glucose (BG) 80 or higher inject 0 units for meal coverage and add correction dose per scale. If not eating, correction dose only. BG <150= 0 unit; BG 150-200= 1 unit; BG 201-250= 2 unit; BG 251-300= 3 unit; BG 301-350= 4 unit; BG 351-400= 5 unit; BG >400= 6 unit and Call Primary Care. (When not using pump) What changed: You were already taking a medication with the same name, and this prescription was added. Make sure you understand how and when to take each.   ondansetron  4 MG disintegrating  tablet Commonly known as: ZOFRAN -ODT Take 4 mg by mouth every 8 (eight) hours as needed for nausea or vomiting.   OXYGEN Inhale 3 L into the lungs at bedtime.   Pen Needles 31G X 5 MM Misc 1 each by Does not apply route 3 (three) times daily. May dispense any manufacturer covered by patient's insurance.   PRESCRIPTION MEDICATION See admin instructions.  IDPN LV CUSTOM (100GM)   INFUSE 1 BAG DURING HD 3 TIMES WEEKLY AS FOLLOWS 1ST WEEK 55ML/HR; 2ND WEEK 110ML/HR; 3RD WEEK 163ML/HR; THEN CONTINUE WITH GOAL RATE= 163ML/HR OVER 3HRS . TITRATE AS TOLERATED. ADMINISTER IV VIA VENOUS DRIP CHAMBER.   rizatriptan  10 MG tablet Commonly known as: MAXALT  Take 10 mg by mouth as needed for migraine. May repeat in 2 hours if needed   rosuvastatin  10 MG tablet Commonly known as: CRESTOR  Take 1 tablet (10 mg total) by mouth daily. What changed: when to take this   sevelamer  carbonate 800 MG tablet Commonly known as: RENVELA  Take 1,600 mg by mouth 3 (three) times daily with meals.   torsemide  20 MG tablet Commonly known as: DEMADEX  Take 40 mg by mouth every Monday, Wednesday, and Friday.   VITAMIN D -3 PO Take 1 tablet by mouth in the morning.        Follow-up Information     Jude Norton, NP Follow up on 11/21/2023.   Specialties: Cardiology, Family Medicine Why: Cardiology Follow-up on 11/21/2023 at 10:05 AM. Contact information: [address] [place] [address] [phone]         Lory Rough., PA-C. Schedule an appointment as soon as possible for a visit in 1 month(s).   Specialty: Family Medicine Contact information: [address] [place] [address] [phone]                Allergies  Allergen Reactions   Ciprofloxacin  Swelling and Other (See Comments)    Per pt caused lips swell and nauseous feeling   Levaquin [Levofloxacin] Swelling and Other (See Comments)    Per pt caused lips swell and nauseous feeling    Linaclotide Other (See Comments)    Cause severe dehydration   Promethazine  Other (See Comments)    Completely wipes out/fatigue   Buspar [Buspirone] Other (See Comments)    abd cramping   Advair Diskus [Fluticasone -Salmeterol] Other (See Comments)    Thrush    Biaxin [Clarithromycin] Rash   Hydroxyzine Palpitations    Consultations: Nephrology Cardiology   Procedures/Studies: ECHOCARDIOGRAM  COMPLETE Result Date: 10/31/2023    ECHOCARDIOGRAM REPORT   Patient Name:   Jasmine [name] Date of Exam: 10/31/2023 Medical Rec #:  [id number]         Height:       69.0 in Accession #:    [id number]        Weight:       257.0 lb Date of Birth:  [birth year]         BSA:          2.298 m Patient Age:    52 years          BP:           171/64 mmHg Patient Gender: F                 HR:           76 bpm. Exam Location:  [facility] Procedure: 2D Echo, Cardiac Doppler and Color Doppler (Both Spectral and Color            Flow Doppler were utilized during procedure). Indications:    Elevated Troponin  History:        Patient has prior history of Echocardiogram examinations, most                 recent 09/08/2023. CAD; Risk Factors:Hypertension, Diabetes,                 Dyslipidemia and Former Smoker. ESRD on dialysis.  Sonographer:    Denese Finn RCS Referring Phys: [phone] CARLOS MADERA IMPRESSIONS  1. Distal septal hypokinesis . Left ventricular ejection fraction, by estimation, is 55%. The left ventricle has low normal function. The left ventricle has no regional wall motion abnormalities. Left ventricular diastolic parameters were normal.  2. Right ventricular systolic function is normal. The right ventricular size is normal.  3. Left atrial size was moderately dilated.  4. Right atrial size was ? dialysis catheter in RA.  5. The mitral valve is abnormal. Trivial mitral valve regurgitation. No evidence of mitral stenosis. Moderate mitral annular calcification.  6. The aortic valve is tricuspid. There is moderate  calcification of the aortic valve. There is moderate thickening of the aortic valve. Aortic valve regurgitation is not visualized. Mild aortic valve stenosis.  7. The inferior vena cava is normal in size with greater than 50% respiratory variability, suggesting right atrial pressure of 3 mmHg. FINDINGS  Left Ventricle: Distal septal hypokinesis. Left ventricular ejection fraction, by estimation, is 55%. The left ventricle has low normal function. The left ventricle has no regional wall motion abnormalities. Strain was performed and the global longitudinal strain is indeterminate. The left ventricular internal cavity size was normal in size. There is no left ventricular hypertrophy. Left ventricular diastolic parameters were normal. Right Ventricle: The right ventricular size is normal. No increase in right ventricular wall thickness. Right ventricular systolic function is normal. Left Atrium: Left atrial size was moderately dilated. Right Atrium: Right atrial size was ? dialysis catheter in RA. Pericardium: There is no evidence of pericardial effusion. Mitral Valve: The mitral valve is abnormal. There is mild thickening of the mitral valve leaflet(s). There is mild calcification of the mitral valve leaflet(s). Moderate mitral annular calcification. Trivial mitral valve regurgitation. No evidence of mitral valve stenosis. Tricuspid Valve: The tricuspid valve is normal in structure. Tricuspid valve regurgitation is not demonstrated. No evidence of tricuspid stenosis. Aortic Valve: The aortic valve is tricuspid. There is moderate calcification of the aortic valve. There  is moderate thickening of the aortic valve. Aortic valve regurgitation is not visualized. Mild aortic stenosis is present. Aortic valve mean gradient measures 6.0 mmHg. Aortic valve peak gradient measures 14.0 mmHg. Aortic valve area, by VTI measures 1.69 cm. Pulmonic Valve: The pulmonic valve was normal in structure. Pulmonic valve regurgitation is  trivial. No evidence of pulmonic stenosis. Aorta: The aortic root is normal in size and structure. Venous: The inferior vena cava is normal in size with greater than 50% respiratory variability, suggesting right atrial pressure of 3 mmHg. IAS/Shunts: No atrial level shunt detected by color flow Doppler. Additional Comments: 3D was performed not requiring image post processing on an independent workstation and was indeterminate.  LEFT VENTRICLE PLAX 2D LVIDd:         5.30 cm   Diastology LVIDs:         3.00 cm   LV e' medial:    6.74 cm/s LV PW:         1.10 cm   LV E/e' medial:  26.3 LV IVS:        1.00 cm   LV e' lateral:   9.46 cm/s LVOT diam:     1.70 cm   LV E/e' lateral: 18.7 LV SV:         77 LV SV Index:   33 LVOT Area:     2.27 cm  RIGHT VENTRICLE RV S prime:     13.30 cm/s TAPSE (M-mode): 2.4 cm LEFT ATRIUM             Index        RIGHT ATRIUM           Index LA diam:        5.20 cm 2.26 cm/m   RA Area:     15.40 cm LA Vol (A2C):   82.7 ml 35.98 ml/m  RA Volume:   41.00 ml  17.84 ml/m LA Vol (A4C):   76.8 ml 33.42 ml/m LA Biplane Vol: 84.4 ml 36.72 ml/m  AORTIC VALVE AV Area (Vmax):    2.00 cm AV Area (Vmean):   1.82 cm AV Area (VTI):     1.69 cm AV Vmax:           187.00 cm/s AV Vmean:          116.000 cm/s AV VTI:            0.454 m AV Peak Grad:      14.0 mmHg AV Mean Grad:      6.0 mmHg LVOT Vmax:         165.00 cm/s LVOT Vmean:        93.000 cm/s LVOT VTI:          0.339 m LVOT/AV VTI ratio: 0.75  AORTA Ao Root diam: 3.10 cm MITRAL VALVE                TRICUSPID VALVE MV Area (PHT): 4.60 cm     TR Peak grad:   41.7 mmHg MV Decel Time: 165 msec     TR Vmax:        323.00 cm/s MR Peak grad: 173.2 mmHg MR Mean grad: 126.0 mmHg    SHUNTS MR Vmax:      658.00 cm/s   Systemic VTI:  0.34 m MR Vmean:     532.0 cm/s    Systemic Diam: 1.70 cm MV E velocity: 177.00 cm/s MV A velocity: 133.00 cm/s MV E/A ratio:  1.33 Janelle Mediate MD  Electronically signed by Janelle Mediate MD Signature Date/Time:  10/31/2023/11:54:49 AM    Final    DG Chest 2 View Result Date: 10/30/2023 CLINICAL DATA:  Shortness of breath EXAM: CHEST - 2 VIEW COMPARISON:  X-ray 09/10/2023.  Older exams as well FINDINGS: Underinflation. Left IJ double-lumen catheter with tip overlying the right atrium. Stable cardiopericardial silhouette with some vascular congestion. Question trace edema. No pneumothorax or effusion. No consolidation. Overlapping cardiac leads. Films are under penetrated. IMPRESSION: Underinflation. Vascular congestion. Question trace edema. Double-lumen left IJ catheter. Electronically Signed   By: Adrianna Horde M.D.   On: 10/30/2023 14:26   PERIPHERAL VASCULAR CATHETERIZATION Result Date: 10/07/2023   on 10/07/2023.   Okay to discharge home anytime after 11:20 AM as long as clinically stable Patient presents with poor flows in her left  IJ tunneled hemodialysis catheter (19 cm cuff to tip- Palindrome) that was placed here 1 week ago. On examination, aspiration and flushing from both ports is sluggish. Chest x-ray confirms the catheter tip is retracted up into the SVC. The catheter cuff is 1/2cm deep to the exit site.  Summary: 1) The patient had a successful 23 cm CTT Palindrome hemodialysis catheter exchange in the left internal jugular vein (to replace the 19 cm catheter). 2) No sheath noted through either port. 3) Okay to use catheter immediately. Description of procedure: The left neck, chest and the catheter were prepped and draped in the usual sterile fashion. The exit site and adjacent tunnel tract were anesthetized with lidocaine  1% with epinephrine . The cuff was dissected free with a curved Kelly and manual traction. The catheter was withdrawn and venogram through each of the ports was performed; there was no fibrin sheath evident through either port. A hydrophilic wire was manipulated and advanced through the arterial port and the tip was parked in the IVC. The catheter was completely removed and noted to be  entirely intact. A new 23 cm cuff to tip Palindrome catheter was inserted over the guidewire and the tip parked in the right atrium and at that point the cuff was approximately 1 cm deep to the exit site. Aspiration and flushing of both limbs of the catheter confirmed excellent flow. No kinks were visible on fluoroscopic imaging. Both limbs of the catheter were locked with citrate and sterile caps were placed. The hub was secured on to the chest wall with 2-0 nylon wing sutures. Sterile dressings were placed, and the patient returned to recovery in stable condition. Sedation: 1 mg Versed , 25 mcg Fentanyl  Sedation time: 19 minutes Contrast: 2 mL Monitoring: Because of the patient's comorbid conditions and sedation during the procedure, continuous EKG monitoring and O2 saturation monitoring was performed throughout the procedure by the RN. There were no abnormal arrhythmias encountered. Complications: None. Diagnoses:  T82.49XA Other complication of vascular dialysis catheter (Poor flows) N18.6 End stage renal disease Z99.2 Dialysis dependence Procedures Coding: [address] Tunneled catheter exchange 77001  Fluoroscopy guidance for catheter exchange. U0454 Contrast Recommendations: Remove the suture in 3 weeks. 2.   Report any blood flow problems to [facility]. Discharge: The patient was discharged home in stable condition. The patient was given education regarding the care of the catheter and specific instructions in case of any problems.   US  Venous Img Upper Uni Right Result Date: 10/04/2023 CLINICAL DATA:  Right upper extremity edema for 1 week. EXAM: RIGHT UPPER EXTREMITY VENOUS DOPPLER ULTRASOUND TECHNIQUE: Gray-scale sonography with graded compression, as well as color Doppler and duplex ultrasound were performed to evaluate  the upper extremity deep venous system from the level of the subclavian vein and including the jugular, axillary, basilic, radial, ulnar and upper cephalic vein. Spectral Doppler was utilized  to evaluate flow at rest and with distal augmentation maneuvers. COMPARISON:  None available FINDINGS: Contralateral Subclavian Vein: Respiratory phasicity is normal and symmetric with the symptomatic side. No evidence of thrombus. Normal compressibility. Internal Jugular Vein: No evidence of thrombus. Normal compressibility, respiratory phasicity and response to augmentation. Subclavian Vein: No evidence of thrombus. Normal compressibility, respiratory phasicity and response to augmentation. Axillary Vein: No evidence of thrombus. Normal compressibility, respiratory phasicity and response to augmentation. Cephalic Vein: No evidence of thrombus. Normal compressibility, respiratory phasicity and response to augmentation. Basilic Vein: No evidence of thrombus. Normal compressibility, respiratory phasicity and response to augmentation. Brachial Veins: No evidence of thrombus. Normal compressibility, respiratory phasicity and response to augmentation. Radial Veins: No evidence of thrombus. Normal compressibility, respiratory phasicity and response to augmentation. Ulnar Veins: No evidence of thrombus. Normal compressibility, respiratory phasicity and response to augmentation. Venous Reflux:  None visualized. Other Findings:  None visualized. IMPRESSION: No evidence of DVT within the right upper extremity. Electronically Signed   By: Elester Grim M.D.   On: 10/04/2023 14:25     Discharge Exam: Vitals:   11/03/23 1020 11/03/23 1030  BP: 124/76 (!) 113/53  Pulse: 82 80  Resp: 20 (!) 22  Temp:    SpO2: 100% 100%   Vitals:   11/03/23 0729 11/03/23 1008 11/03/23 1020 11/03/23 1030  BP: (!) 174/83 123/63 124/76 (!) 113/53  Pulse: 93 82 82 80  Resp: 16 20 20  (!) 22  Temp: 99.3 F (37.4 C) 98.4 F (36.9 C)    TempSrc: Oral     SpO2: 93% 98% 100% 100%  Weight:  114 kg    Height:        General: Pt is alert, awake, not in acute distress Cardiovascular: RRR, S1/S2 +, no rubs, no gallops Respiratory: CTA  bilaterally, no wheezing, no rhonchi Abdominal: Soft, NT, ND, bowel sounds + Extremities: no edema, no cyanosis    The results of significant diagnostics from this hospitalization (including imaging, microbiology, ancillary and laboratory) are listed below for reference.     Microbiology: Recent Results (from the past 240 hours)  MRSA Next Gen by PCR, Nasal     Status: None   Collection Time: 10/30/23  4:12 PM   Specimen: Nasal Mucosa; Nasal Swab  Result Value Ref Range Status   MRSA by PCR Next Gen NOT DETECTED NOT DETECTED Final    Comment: (NOTE) The GeneXpert MRSA Assay (FDA approved for NASAL specimens only), is one component of a comprehensive MRSA colonization surveillance program. It is not intended to diagnose MRSA infection nor to guide or monitor treatment for MRSA infections. Test performance is not FDA approved in patients less than 9 years old. Performed at [facility], [address], [place], [place] [address]      Labs: BNP (last 3 results) No results for input(s): "BNP" in the last 8760 hours. Basic Metabolic Panel: Recent Labs  Lab 10/30/23 2147 10/31/23 0135 11/01/23 0427 11/02/23 0913 11/03/23 0453  NA 129* 129* 129* 132* 133*  K 3.4* 3.6 3.5 3.3* 3.3*  CL 93* 92* 93* 97* 99  CO2 27 25 26 26 25   GLUCOSE 212* 219* 305* 252* 215*  BUN 33* 33* 41* 21* 25*  CREATININE 3.52* 3.59* 3.85* 2.97* 3.93*  CALCIUM  9.5 9.4 9.5 8.9 9.0  MG  --   --   --  1.6* 1.7  PHOS  --   --  4.2  --   --    Liver Function Tests: Recent Labs  Lab 10/30/23 1333 10/31/23 0135  AST 21 12*  ALT 13 10  ALKPHOS 134* 126  BILITOT 2.0* 0.9  PROT 6.7 6.0*  ALBUMIN 3.3* 3.1*   No results for input(s): "LIPASE", "AMYLASE" in the last 168 hours. No results for input(s): "AMMONIA" in the last 168 hours. CBC: Recent Labs  Lab 10/30/23 1318 10/30/23 1351 10/31/23 0135 11/01/23 0427 11/02/23 0453 11/03/23 0453  WBC 5.6  --  10.6* 12.3* 11.3* 9.3  HGB 8.8* 9.9* 9.1*  9.6* 9.8* 9.4*  HCT 28.8* 29.0* 26.8* 28.9* 29.6* 29.6*  MCV 97.6  --  91.8 90.0 94.3 93.4  PLT 190  --  222 250 234 221   Cardiac Enzymes: No results for input(s): "CKTOTAL", "CKMB", "CKMBINDEX", "TROPONINI" in the last 168 hours. BNP: Invalid input(s): "POCBNP" CBG: Recent Labs  Lab 11/02/23 1618 11/02/23 1944 11/02/23 2358 11/03/23 0419 11/03/23 0726  GLUCAP 98 187* 158* 190* 198*   D-Dimer No results for input(s): "DDIMER" in the last 72 hours. Hgb A1c No results for input(s): "HGBA1C" in the last 72 hours. Lipid Profile No results for input(s): "CHOL", "HDL", "LDLCALC", "TRIG", "CHOLHDL", "LDLDIRECT" in the last 72 hours. Thyroid  function studies No results for input(s): "TSH", "T4TOTAL", "T3FREE", "THYROIDAB" in the last 72 hours.  Invalid input(s): "FREET3" Anemia work up Recent Labs    11/01/23 0427  FERRITIN 196  TIBC 306  IRON  46   Urinalysis    Component Value Date/Time   COLORURINE STRAW (A) 10/30/2023 1540   APPEARANCEUR CLEAR 10/30/2023 1540   LABSPEC 1.011 10/30/2023 1540   PHURINE 7.0 10/30/2023 1540   GLUCOSEU >=500 (A) 10/30/2023 1540   HGBUR NEGATIVE 10/30/2023 1540   BILIRUBINUR NEGATIVE 10/30/2023 1540   KETONESUR 20 (A) 10/30/2023 1540   PROTEINUR 100 (A) 10/30/2023 1540   UROBILINOGEN 0.2 03/19/2015 1525   NITRITE NEGATIVE 10/30/2023 1540   LEUKOCYTESUR NEGATIVE 10/30/2023 1540   Sepsis Labs Recent Labs  Lab 10/31/23 0135 11/01/23 0427 11/02/23 0453 11/03/23 0453  WBC 10.6* 12.3* 11.3* 9.3   Microbiology Recent Results (from the past 240 hours)  MRSA Next Gen by PCR, Nasal     Status: None   Collection Time: 10/30/23  4:12 PM   Specimen: Nasal Mucosa; Nasal Swab  Result Value Ref Range Status   MRSA by PCR Next Gen NOT DETECTED NOT DETECTED Final    Comment: (NOTE) The GeneXpert MRSA Assay (FDA approved for NASAL specimens only), is one component of a comprehensive MRSA colonization surveillance program. It is not intended  to diagnose MRSA infection nor to guide or monitor treatment for MRSA infections. Test performance is not FDA approved in patients less than 91 years old. Performed at [facility], [address] [place], [place] [address]      Time coordinating discharge: 35 minutes  SIGNED:   Cornelius Dill, DO [facility] 11/03/2023, 10:43 AM  If 7PM-7AM, please contact night-coverage [email or web address]

## 2023-11-03 NOTE — Procedures (Signed)
 HD Note:  Some information was entered later than the data was gathered due to patient care needs. The stated time with the data is accurate.  Brought patient in bed to the unit.  Patient stated that she was very sleepy.  She stated that she did not sleep well last night.  She did fall asleep as soon as the monitoring devices were placed.  Alert and oriented.   Patient BP was soft and goal was reduced to accommodate.  See flowsheet for details. Patient BP recovered once patient became awake and alert.  Machine dialyzer clotted, machine messaging was that it had to be heated prior to setting up again.  The treatment ended at 3 hours and 3 min with 1600 mls removed.  No other machine available at this time. Dr. Cindra Cree made aware, no new orders at this time.  Informed consent signed and in chart.   Access used: Upper left chest HD catheter Access issues: None  Alert, without acute distress.  Hand-off given to patient's nurse.   Transported back to the room   [name], RN [facility].

## 2023-11-03 NOTE — Progress Notes (Signed)
 Progress Note  Patient Name: Jasmine [name] Date of Encounter: 11/03/2023  Primary Cardiologist: Arun K Thukkani, MD  Interval Summary   Patient denies CP  Breathing is OK   For dialysis today   Then home   Vital Signs    Vitals:   11/02/23 1950 11/03/23 0423 11/03/23 0459 11/03/23 0729  BP: (!) 142/63 (!) 183/92 (!) 161/66 (!) 174/83  Pulse: 78 88  93  Resp:  18  16  Temp: 99 F (37.2 C) 98.4 F (36.9 C)  99.3 F (37.4 C)  TempSrc: Oral Oral  Oral  SpO2: 93% 93%  93%  Weight:      Height:        Intake/Output Summary (Last 24 hours) at 11/03/2023 0858 Last data filed at 11/02/2023 2134 Gross per 24 hour  Intake 240 ml  Output --  Net 240 ml   Filed Weights   11/01/23 1027 11/01/23 1445 11/01/23 1840  Weight: 116 kg 116.1 kg 114.4 kg    Physical Exam   GEN: No acute distress.   Neck: JVP is not elevated  Cardiac: RRR, 1-2/6 systolic murmur Respiratory:  Clear to auscultation bilaterally. GI: Soft, nontender, bowel sounds present. MS: No LE  edema.  ECG/Telemetry    SR  Labs    Chemistry Recent Labs  Lab 10/30/23 1333 10/30/23 1351 10/31/23 0135 11/01/23 0427 11/02/23 0913 11/03/23 0453  NA 120*   < > 129* 129* 132* 133*  K 5.1   < > 3.6 3.5 3.3* 3.3*  CL 82*   < > 92* 93* 97* 99  CO2 20*   < > 25 26 26 25   GLUCOSE 983*   < > 219* 305* 252* 215*  BUN 29*   < > 33* 41* 21* 25*  CREATININE 3.08*   < > 3.59* 3.85* 2.97* 3.93*  CALCIUM  8.7*   < > 9.4 9.5 8.9 9.0  PROT 6.7  --  6.0*  --   --   --   ALBUMIN 3.3*  --  3.1*  --   --   --   AST 21  --  12*  --   --   --   ALT 13  --  10  --   --   --   ALKPHOS 134*  --  126  --   --   --   BILITOT 2.0*  --  0.9  --   --   --   GFRNONAA 18*   < > 15* 14* 18* 13*  ANIONGAP 18*   < > 12 10 9 9    < > = values in this interval not displayed.    Hematology Recent Labs  Lab 11/01/23 0427 11/02/23 0453 11/03/23 0453  WBC 12.3* 11.3* 9.3  RBC 3.21* 3.14* 3.17*  HGB 9.6* 9.8* 9.4*  HCT 28.9*  29.6* 29.6*  MCV 90.0 94.3 93.4  MCH 29.9 31.2 29.7  MCHC 33.2 33.1 31.8  RDW 12.3 12.8 12.8  PLT 250 234 221   Cardiac Enzymes Recent Labs  Lab 10/30/23 1516 10/30/23 2007 10/31/23 0135 10/31/23 1210 10/31/23 1515  TROPONINIHS 579* 739* 828* 381* 364*   Lipid Panel     Component Value Date/Time   CHOL 151 08/15/2023 0000   TRIG 67 08/15/2023 0000   HDL 79 08/15/2023 0000   CHOLHDL 1.9 08/15/2023 0000   CHOLHDL 3.2 09/12/2021 0338   VLDL 28 09/12/2021 0338   LDLCALC 59 08/15/2023 0000   LABVLDL 13  08/15/2023 0000    Cardiac Studies   Echocardiogram 10/31/2023:  1. Distal septal hypokinesis . Left ventricular ejection fraction, by  estimation, is 55%. The left ventricle has low normal function. The left  ventricle has no regional wall motion abnormalities. Left ventricular  diastolic parameters were normal.   2. Right ventricular systolic function is normal. The right ventricular  size is normal.   3. Left atrial size was moderately dilated.   4. Right atrial size was ? dialysis catheter in RA.   5. The mitral valve is abnormal. Trivial mitral valve regurgitation. No  evidence of mitral stenosis. Moderate mitral annular calcification.   6. The aortic valve is tricuspid. There is moderate calcification of the  aortic valve. There is moderate thickening of the aortic valve. Aortic  valve regurgitation is not visualized. Mild aortic valve stenosis.   7. The inferior vena cava is normal in size with greater than 50%  respiratory variability, suggesting right atrial pressure of 3 mmHg.   Assessment & Plan   1.  NSTEMI   PT with CAD   Abnormal PET stress test (as pretransplant evaluation)  Pt is   S/P DES to D1, DES x 2 to mid LAD and DES to mid RCA in March 2025    Presented with CP that was not her typical angina   Peak high-sensitivity troponin I in the 800s.  No recurrent chest pain.  ECG shows old septal Q waves.  Echocardiogram demonstrates LVEF approximately 55%  with distal septal hypokinesis.   Pt has completed 48  hours of  IV heparin    Denies CP    Plan to follow and continue medical Rx      2  HTN   BP remains high   She is on carvedilolol 12.5 bid , Imdur 30 hydralazine  now 75 tid and torsemide  40   BP will need to be followed closely after dialysis and titrated to improve control       3.  ESRD on hemodialysis.  For dialysis today    4.  Type 1 diabetes mellitus, fluctuating control on insulin  pump.  Presented with DKA.   5.  Mixed hyperlipidemia, LDL 59 in March on Crestor  10 mg daily. .  Outpt follow up plans have been made  For questions or updates, please contact [facility] Please consult [email or web address] for contact info under   Signed, Ola Berger, MD  11/03/2023, 8:58 AM

## 2023-11-03 NOTE — Inpatient Diabetes Management (Signed)
 Inpatient Diabetes Program Recommendations  AACE/ADA: New Consensus Statement on Inpatient Glycemic Control  Target Ranges:  Prepandial:   less than 140 mg/dL      Peak postprandial:   less than 180 mg/dL (1-2 hours)      Critically ill patients:  140 - 180 mg/dL    Latest Reference Range & Units 11/02/23 07:34 11/02/23 12:03 11/02/23 16:18 11/02/23 19:44 11/02/23 23:58 11/03/23 04:19 11/03/23 07:26  Glucose-Capillary 70 - 99 mg/dL 604 (H) 540 (H) 98 981 (H) 158 (H) 190 (H) 198 (H)   Review of Glycemic Control  Diabetes history: DM1 Outpatient Diabetes medications: Tandem MOBI insulin  pump with Novolog , Dexcom G7 Current orders for Inpatient glycemic control: Semglee  24 units BID, Novolog  0-9 units Q4H, Novolog  3 units TID with meals   Inpatient Diabetes Program Recommendations:    Insulin : Per MD, plan will be to discharge patient home today after HD. Note MD has asked nurse not to give Semglee  dose scheduled for 10am today. Plan will be for patient to resume her insulin  pump at discharge.  Discharge Recommendations: Other recommendations: Recommend pt resume insulin  pump at discharge. Will need Rx for Tresiba and Novolog  Pens at discharge (to have on hand in case she is not using her insulin  pump). Long acting recommendations: Insulin  Degludec (TRESIBA) FlexTouch Pen Tresiba 50 units daily (when not using pump)  Short acting recommendations:  Meal + Correction coverage Insulin  aspart (NOVOLOG ) FlexPen  Sensitive Scale.  Novolog  meal coverage plus correction SQ (when not using insulin  pump) Supply/Referral recommendations: Pen needles - standard   Use Adult Diabetes Insulin  Treatment Post Discharge order set.  Thanks, Beacher Limerick, RN, MSN, CDCES Diabetes Coordinator Inpatient Diabetes Program [phone] (Team Pager from 8am to 5pm)

## 2023-11-07 ENCOUNTER — Other Ambulatory Visit: Payer: Self-pay

## 2023-11-07 ENCOUNTER — Encounter: Payer: Self-pay | Admitting: Infectious Diseases

## 2023-11-07 ENCOUNTER — Ambulatory Visit (INDEPENDENT_AMBULATORY_CARE_PROVIDER_SITE_OTHER): Admitting: Infectious Diseases

## 2023-11-07 VITALS — BP 128/60 | HR 64 | Temp 98.0°F

## 2023-11-07 DIAGNOSIS — B9562 Methicillin resistant Staphylococcus aureus infection as the cause of diseases classified elsewhere: Secondary | ICD-10-CM

## 2023-11-07 DIAGNOSIS — R7881 Bacteremia: Secondary | ICD-10-CM

## 2023-11-07 DIAGNOSIS — N186 End stage renal disease: Secondary | ICD-10-CM | POA: Diagnosis not present

## 2023-11-07 DIAGNOSIS — Z992 Dependence on renal dialysis: Secondary | ICD-10-CM | POA: Diagnosis not present

## 2023-11-07 MED ORDER — MUPIROCIN 2 % EX OINT: 1.0000 | TOPICAL_OINTMENT | Freq: Two times a day (BID) | CUTANEOUS | 1 refills | Status: DC

## 2023-11-07 NOTE — Progress Notes (Signed)
 Patient: Jasmine [name]  DOB: [birth year] MRN: [id number] PCP: Lory Rough., PA-C    Reason for Visit: hospital follow up MRSA BSI, HD line vs thigh abscess   Chief Complaint  Patient presents with   Follow-up      Subjective   Subjective:   Discussed the use of AI scribe software for clinical note transcription with the patient, who gave verbal consent to proceed.  History of Present Illness   Jasmine TAYAG "Landa Pine" is a 53 year old female with end stage renal disease who presents for hospital follow-up.  She was admitted to the hospital on Oct 30, 2023, with diabetic ketoacidosis. Prior to this, she was hospitalized on September 05, 2023, for a MRSA bloodstream infection related to a right hemodialysis catheter vs thigh abscess. During this admission, she required an incision and drainage of a right thigh abscess, which cultured abundant Staphylococcus aureus. A transesophageal echocardiogram on September 08, 2023, showed no signs of endocarditis. She was treated with IV vancomycin  after hemodialysis until October 05, 2023. She has been symptom-free for about a month. No current symptoms of infection such as fever, chills. She has chronic night sweats but these are stable and acutely unchanged.   She has had multiple admissions related to end stage renal disease and access. . Her catheter was last placed on September 30, 2023, and again on Oct 07, 2023. Vein mapping was done on Oct 14, 2023, with Dr. Charlotte Cookey. She is not a candidate for transplant due to cardiac medical problems, including stents, and being considered 'feeble'. The catheter poses a risk for infection, and she is awaiting further evaluation for a graft, which is expected to be checked by the end of June 2025. She is going for another evaluation for transplant soon.        ROS  Past Medical History:  Diagnosis Date   Anemia    Anxiety    Arthritis    Asthma    Balance problems    Bipolar disorder (HCC)     Charcot ankle    Chronic fatigue    Coronary artery disease    Depression    DKA, type 1 (HCC) 11/04/2011   Elevated cholesterol    ESRD on hemodialysis (HCC)    TTS at [facility]   Fibromyalgia    GERD (gastroesophageal reflux disease)    Headache    History of suicidal ideation    Hyperlipemia    Hypertension    Hypothyroidism    IBS (irritable bowel syndrome)    Memory changes    Obesity    Sleep apnea    wears cpap   Stress incontinence    Pt had surgery to correct this.   Tobacco abuse    Tremor     Outpatient Medications Prior to Visit  Medication Sig Dispense Refill   acetaminophen  (TYLENOL ) 500 MG tablet Take 500-1,000 mg by mouth every 6 (six) hours as needed (pain.).     aspirin  EC 81 MG tablet Take 1 tablet (81 mg total) by mouth daily with breakfast. Swallow whole. 30 tablet 12   azelastine  (ASTELIN ) 0.1 % nasal spray Place 2 sprays into both nostrils 2 (two) times daily. Use in each nostril as directed (Patient taking differently: Place 2 sprays into both nostrils daily as needed for allergies or rhinitis.) 30 mL 2   Blood Glucose Monitoring Suppl DEVI 1 each by Does not apply route 3 (three) times daily. May dispense any manufacturer  covered by patient's insurance. 1 each 0   buPROPion  (WELLBUTRIN  XL) 150 MG 24 hr tablet Take 1 tablet (150 mg total) by mouth daily. 30 tablet 3   carvedilol  (COREG ) 12.5 MG tablet Take 12.5 mg by mouth 2 (two) times daily with a meal.     Cholecalciferol  (VITAMIN D -3 PO) Take 1 tablet by mouth in the morning.     clopidogrel  (PLAVIX ) 75 MG tablet Take 1 tablet (75 mg total) by mouth daily. 30 tablet 5   Continuous Blood Gluc Sensor MISC 1 each by Does not apply route as directed. Use as directed every 14 days. May dispense FreeStyle [employer] or similar.     diclofenac  Sodium (VOLTAREN ) 1 % GEL Apply 2 g topically 4 (four) times daily as needed (pain.).     DULoxetine  (CYMBALTA ) 30 MG capsule Take 1 capsule (30 mg total)  by mouth daily. 30 capsule 3   folic acid  (FOLVITE ) 800 MCG tablet Take 800 mcg by mouth in the morning.     glucagon (GLUCAGEN  HYPOKIT) 1 MG SOLR injection Inject 1 mg into the skin once as needed for up to 1 dose for low blood sugar. GlucaGen  HypoKit 1 mg Injection 1 each 3   Glucose Blood (BLOOD GLUCOSE TEST STRIPS) STRP 1 each by Does not apply route 3 (three) times daily. Use as directed to check blood sugar. May dispense any manufacturer covered by patient's insurance and fits patient's device. 100 strip 0   hydrALAZINE  (APRESOLINE ) 25 MG tablet Take 3 tablets (75 mg total) by mouth 3 (three) times daily. 270 tablet 1   HYDROcodone -acetaminophen  (NORCO/VICODIN) 5-325 MG tablet Take 1 tablet by mouth every 6 (six) hours as needed for moderate pain (pain score 4-6). 10 tablet 0   insulin  aspart (NOVOLOG ) 100 UNIT/ML FlexPen If eating and Blood Glucose (BG) 80 or higher inject 0 units for meal coverage and add correction dose per scale. If not eating, correction dose only. BG <150= 0 unit; BG 150-200= 1 unit; BG 201-250= 2 unit; BG 251-300= 3 unit; BG 301-350= 4 unit; BG 351-400= 5 unit; BG >400= 6 unit and Call Primary Care. (When not using pump) 15 mL 0   insulin  degludec (TRESIBA ) 100 UNIT/ML FlexTouch Pen Inject 50 Units into the skin daily. May substitute as needed per insurance. When not using pump. 15 mL 0   Insulin  Pen Needle (PEN NEEDLES) 31G X 5 MM MISC 1 each by Does not apply route 3 (three) times daily. May dispense any manufacturer covered by patient's insurance. 100 each 0   isosorbide  mononitrate (IMDUR ) 30 MG 24 hr tablet Take 1 tablet (30 mg total) by mouth daily. 30 tablet 1   lamoTRIgine  (LAMICTAL ) 200 MG tablet Take 1 tablet (200 mg total) by mouth every evening. 30 tablet 5   Lancet Device MISC 1 each by Does not apply route 3 (three) times daily. May dispense any manufacturer covered by patient's insurance. 1 each 0   Lancets MISC 1 each by Does not apply route 3 (three) times  daily. Use as directed to check blood sugar. May dispense any manufacturer covered by patient's insurance and fits patient's device. 100 each 0   levothyroxine  (SYNTHROID ) 175 MCG tablet Take 1.5 tablets (mcg) by mouth on Sundays in the mornings & take 1 tablet (175 mcg) by mouth on all other days.     [START ON 11/11/2023] LORazepam  (ATIVAN ) 0.5 MG tablet Take 1 tablet (0.5 mg total) by mouth daily as needed for  anxiety. 30 tablet 0   Melatonin 10 MG TABS Take 10 mg by mouth at bedtime.     NOVOLOG  100 UNIT/ML injection Inject into the skin continuous. Sliding Scale Insulin  pump 2.5 units basal Bolus with meal depending on the size     ondansetron  (ZOFRAN -ODT) 4 MG disintegrating tablet Take 4 mg by mouth every 8 (eight) hours as needed for nausea or vomiting.     OXYGEN Inhale 3 L into the lungs at bedtime.     rizatriptan  (MAXALT ) 10 MG tablet Take 10 mg by mouth as needed for migraine. May repeat in 2 hours if needed     sevelamer  carbonate (RENVELA ) 800 MG tablet Take 1,600 mg by mouth 3 (three) times daily with meals.     torsemide  (DEMADEX ) 20 MG tablet Take 40 mg by mouth every Monday, Wednesday, and Friday.     PRESCRIPTION MEDICATION See admin instructions.  IDPN LV CUSTOM (100GM)   INFUSE 1 BAG DURING HD 3 TIMES WEEKLY AS FOLLOWS 1ST WEEK 55ML/HR; 2ND WEEK 110ML/HR; 3RD WEEK 163ML/HR; THEN CONTINUE WITH GOAL RATE= 163ML/HR OVER 3HRS . TITRATE AS TOLERATED. ADMINISTER IV VIA VENOUS DRIP CHAMBER.     rosuvastatin  (CRESTOR ) 10 MG tablet Take 1 tablet (10 mg total) by mouth daily. (Patient taking differently: Take 10 mg by mouth every evening.) 90 tablet 3   No facility-administered medications prior to visit.     Allergies  Allergen Reactions   Ciprofloxacin  Swelling and Other (See Comments)    Per pt caused lips swell and nauseous feeling   Levaquin [Levofloxacin] Swelling and Other (See Comments)    Per pt caused lips swell and nauseous feeling   Linaclotide Other (See  Comments)    Cause severe dehydration   Promethazine  Other (See Comments)    Completely wipes out/fatigue   Buspar [Buspirone] Other (See Comments)    abd cramping   Advair Diskus [Fluticasone -Salmeterol] Other (See Comments)    Thrush    Biaxin [Clarithromycin] Rash   Hydroxyzine Palpitations    Social History   Tobacco Use   Smoking status: Former    Current packs/day: 0.00    Average packs/day: 0.8 packs/day for 20.0 years (15.0 ttl pk-yrs)    Types: Cigarettes    Start date: 06/08/1991    Quit date: 06/08/2011    Years since quitting: 12.4   Smokeless tobacco: Never  Vaping Use   Vaping status: Never Used  Substance Use Topics   Alcohol use: No   Drug use: No    Family History  Problem Relation Age of Onset   Asthma Mother    Bipolar disorder Mother    Heart disease Father    Lymphoma Father    Hypertension Father    Thyroid  disease Father    Hyperlipidemia Father    Diabetes Father    Cancer Paternal Grandmother        lung and breast   Bladder Cancer Paternal Grandfather    Suicidality Maternal Grandfather    Thyroid  disease Brother        Objective   Objective:   Vitals:   11/07/23 1054  BP: 128/60  Pulse: 64  Temp: 98 F (36.7 C)  SpO2: 100%   There is no height or weight on file to calculate BMI.  Physical Exam Constitutional:      Appearance: Normal appearance. She is not ill-appearing.  HENT:     Mouth/Throat:     Mouth: Mucous membranes are moist.  Pharynx: Oropharynx is clear.  Eyes:     General: No scleral icterus. Cardiovascular:     Rate and Rhythm: Normal rate.  Pulmonary:     Effort: Pulmonary effort is normal.  Chest:       Comments: HD line clean and well dressed  Neurological:     Mental Status: She is oriented to person, place, and time.  Psychiatric:        Mood and Affect: Mood normal.        Thought Content: Thought content normal.        Assessment & Plan:  Assessment and Plan    MRSA bloodstream  infection - Previous MRSA bloodstream infection associated with right hemodialysis catheter and right thigh abscess, treated with IV vancomycin  until April 30th to complete 4 weeks. No recurrence of symptoms such as fever, chills, or night sweats. No evidence of endocarditis on TEE. Risk of recurrence remains due to catheter presence. Mupirocin  recommended for nasal decolonization prior to procedures to reduce risk of skin and soft tissue infections.  - Prescribe mupirocin  ointment for nasal decolonization, to be used twice daily for five days before any elective procedure. - Send mupirocin  prescription to [facility]. - Advise on mupirocin  application: thin layer on a Q-tip, single use per nostril. - No indication for blood work today  - Suspect that her infection has been cured given off antibiotics for 1 month now - Return to clinic PRN      No orders of the defined types were placed in this encounter.   Meds ordered this encounter  Medications   mupirocin  ointment (BACTROBAN ) 2 %    Sig: Apply 1 Application topically 2 (two) times daily.    Dispense:  30 g    Refill:  1    No follow-ups on file.  Total encounter time spent: 14 minutes face to face time + 28 minutes spent in review of multiple hospitalizations March 31 - May 25th, specialist notes, ID continuity care notes and labs/diagnostics.    Gibson Kurtz, MSN, NP-C [facility]  [email or web address] Pager: [phone] Office: [phone] [facility] Main Line: [phone] *Secure Chat Communication Welcome

## 2023-11-07 NOTE — Patient Instructions (Addendum)
 Veohza for hot flashes.   Mupirocin  ointment was sent in for you today.   Can use 5 days before any planned procedure twice a day in the noses (thin layer with a q-tip) to decrease the risk for skin infection.   Can also use on early boils or skin infections that are relatively small - three times a day in that case.   No need to return as I suspect your infection has been cured.

## 2023-11-07 NOTE — Progress Notes (Signed)
 Jasmine [name]

## 2023-11-09 ENCOUNTER — Ambulatory Visit: Attending: Family Medicine | Admitting: Physical Therapy

## 2023-11-21 ENCOUNTER — Ambulatory Visit: Attending: Nurse Practitioner | Admitting: Nurse Practitioner

## 2023-11-21 ENCOUNTER — Encounter: Payer: Self-pay | Admitting: Nurse Practitioner

## 2023-11-21 VITALS — BP 126/68 | HR 67 | Ht 69.0 in | Wt 264.0 lb

## 2023-11-21 DIAGNOSIS — I10 Essential (primary) hypertension: Secondary | ICD-10-CM

## 2023-11-21 DIAGNOSIS — E039 Hypothyroidism, unspecified: Secondary | ICD-10-CM

## 2023-11-21 DIAGNOSIS — E785 Hyperlipidemia, unspecified: Secondary | ICD-10-CM | POA: Diagnosis not present

## 2023-11-21 DIAGNOSIS — I2510 Atherosclerotic heart disease of native coronary artery without angina pectoris: Secondary | ICD-10-CM | POA: Diagnosis not present

## 2023-11-21 DIAGNOSIS — G4733 Obstructive sleep apnea (adult) (pediatric): Secondary | ICD-10-CM

## 2023-11-21 DIAGNOSIS — Z992 Dependence on renal dialysis: Secondary | ICD-10-CM

## 2023-11-21 DIAGNOSIS — N186 End stage renal disease: Secondary | ICD-10-CM

## 2023-11-21 DIAGNOSIS — E1022 Type 1 diabetes mellitus with diabetic chronic kidney disease: Secondary | ICD-10-CM

## 2023-11-21 DIAGNOSIS — I350 Nonrheumatic aortic (valve) stenosis: Secondary | ICD-10-CM

## 2023-11-21 NOTE — Progress Notes (Signed)
 Office Visit    Patient Name: Jasmine [name] Date of Encounter: 11/21/2023  Primary Care Provider:  Lory Rough., PA-C Primary Cardiologist:  Arun K Thukkani, MD  Chief Complaint    53 year old female with a history of CAD s/p DES-D1, DES x 2-m-dLAD, DES-mRCA in 08/2023, mild aortic stenosis, hypertension, hyperlipidemia, type 1 diabetes, ESRD on HD, hypothyroidism, OSA, rheumatoid arthritis, bipolar disorder and prior tobacco use who presents for hospital follow-up related to CAD s/p NSTEMI.  Past Medical History    Past Medical History:  Diagnosis Date   Anemia    Anxiety    Arthritis    Asthma    Balance problems    Bipolar disorder (HCC)    Charcot ankle    Chronic fatigue    Coronary artery disease    Depression    DKA, type 1 (HCC) 11/04/2011   Elevated cholesterol    ESRD on hemodialysis (HCC)    TTS at [facility]   Fibromyalgia    GERD (gastroesophageal reflux disease)    Headache    History of suicidal ideation    Hyperlipemia    Hypertension    Hypothyroidism    IBS (irritable bowel syndrome)    Memory changes    Obesity    Sleep apnea    wears cpap   Stress incontinence    Pt had surgery to correct this.   Tobacco abuse    Tremor    Past Surgical History:  Procedure Laterality Date   AV FISTULA PLACEMENT Left 10/14/2023   Procedure: BRACHIOBASILIC ARTERIOVENOUS (AV) FISTULA CREATION LEFT;  Surgeon: Margherita Shell, MD;  Location: [facility] OR;  Service: Vascular;  Laterality: Left;   CORONARY IMAGING/OCT N/A 08/30/2023   Procedure: CORONARY IMAGING/OCT;  Surgeon: Kyra Phy, MD;  Location: [facility] INVASIVE CV LAB;  Service: Cardiovascular;  Laterality: N/A;   CORONARY STENT INTERVENTION N/A 08/30/2023   Procedure: CORONARY STENT INTERVENTION;  Surgeon: Kyra Phy, MD;  Location: [facility] INVASIVE CV LAB;  Service: Cardiovascular;  Laterality: N/A;   INCONTINENCE SURGERY     IR FLUORO GUIDE CV LINE LEFT  09/10/2023   IR US  GUIDE VASC ACCESS LEFT   09/10/2023   LEFT HEART CATH AND CORONARY ANGIOGRAPHY N/A 07/15/2023   Procedure: LEFT HEART CATH AND CORONARY ANGIOGRAPHY;  Surgeon: Kyra Phy, MD;  Location: [facility] CV [facility];  Service: Cardiovascular;  Laterality: N/A;   LEFT HEART CATH AND CORONARY ANGIOGRAPHY N/A 08/30/2023   Procedure: LEFT HEART CATH AND CORONARY ANGIOGRAPHY;  Surgeon: Kyra Phy, MD;  Location: [facility] IN[facility];  Service: Cardiovascular;  Laterality: N/A;   NASAL FRACTURE SURGERY     ovary removed     OVARY SURGERY     PUBOVAGINAL SLING  08/16/2011   Procedure: Gino Lais;  Surgeon: Livingston Rigg, MD;  Location: [facility] ORS;  Service: Urology;  Laterality: N/A;          TEE WITHOUT CARDIOVERSION N/A 09/08/2023   Procedure: ECHOCARDIOGRAM, TRANSESOPHAGEAL;  Surgeon: Laurann Pollock, MD;  Location: [facility] ORS;  Service: Endoscopy;  Laterality: N/A;   TUNNELLED CATHETER EXCHANGE N/A 09/30/2023   Procedure: TUNNELLED CATHETER EXCHANGE;  Surgeon: Patrick Boor, MD;  Location: [facility] INVASIVE CV LAB;  Service: Cardiovascular;  Laterality: N/A;   TUNNELLED CATHETER EXCHANGE N/A 10/07/2023   Procedure: TUNNELLED CATHETER EXCHANGE;  Surgeon: Melodie Spry, MD;  Location: [facility] INVASIVE CV LAB;  Service: Cardiovascular;  Laterality: N/A;   UTERINE FIBROID SURGERY  2001    Allergies  Allergies  Allergen Reactions   Ciprofloxacin  Swelling and Other (See Comments)    Per pt caused lips swell and nauseous feeling   Levaquin [Levofloxacin] Swelling and Other (See Comments)    Per pt caused lips swell and nauseous feeling   Linaclotide Other (See Comments)    Cause severe dehydration   Promethazine  Other (See Comments)    Completely wipes out/fatigue   Buspar [Buspirone] Other (See Comments)    abd cramping   Advair Diskus [Fluticasone -Salmeterol] Other (See Comments)    Thrush    Biaxin [Clarithromycin] Rash   Hydroxyzine Palpitations     Labs/Other Studies Reviewed    The following studies were reviewed  today:  Cardiac Studies & Procedures   ______________________________________________________________________________________________ CARDIAC CATHETERIZATION  CARDIAC CATHETERIZATION 08/30/2023  Conclusion   Mid LAD to Dist LAD lesion is 80% stenosed.   Prox RCA to Mid RCA lesion is 70% stenosed.   1st Diag lesion is 80% stenosed.   A stent was successfully placed.   A stent was successfully placed.   A stent was successfully placed.   Post intervention, there is a 0% residual stenosis.   Post intervention, there is a 0% residual stenosis.   Post intervention, there is a 0% residual stenosis.  1.  Low syntax score multivessel disease treated involving the first diagonal, mid to distal LAD, and mid right coronary artery. 2.  DES x 1 first diagonal. 3.  DES x 2 mid to distal LAD with OCT imaging optimization. 4.  DES x 1 mid RCA. 5.  LVEDP of 33 mmHg.  Recommendation: Same-day discharge after PCI with 6 hours bedrest.  Dual antiplatelet therapy for preferably 6 months and then Plavix  monotherapy indefinitely.  Findings Coronary Findings Diagnostic  Dominance: Co-dominant  Left Anterior Descending Mid LAD to Dist LAD lesion is 80% stenosed.  First Diagonal Branch 1st Diag lesion is 80% stenosed.  Right Coronary Artery Prox RCA to Mid RCA lesion is 70% stenosed.  Intervention  Mid LAD to Dist LAD lesion Stent A stent was successfully placed. Post-Intervention Lesion Assessment The intervention was successful. Pre-interventional TIMI flow is 3. Post-intervention TIMI flow is 3. There is a 0% residual stenosis post intervention.  1st Diag lesion Stent A stent was successfully placed. Post-Intervention Lesion Assessment The intervention was successful. Pre-interventional TIMI flow is 3. Post-intervention TIMI flow is 3. There is a 0% residual stenosis post intervention.  Prox RCA to Mid RCA lesion Stent A stent was successfully placed. Post-Intervention Lesion  Assessment The intervention was successful. Pre-interventional TIMI flow is 3. Post-intervention TIMI flow is 3. There is a 0% residual stenosis post intervention.   CARDIAC CATHETERIZATION  CARDIAC CATHETERIZATION 07/15/2023  Conclusion   Mid LAD to Dist LAD lesion is 80% stenosed.   1st Diag lesion is 80% stenosed.   Prox RCA to Mid RCA lesion is 70% stenosed.  1.  Multivessel disease consisting involving left main, large bifurcating diagonal, and right coronary artery. 2.  LVEDP of 8 mmHg.  Recommendation: Given history of type 1 diabetes with multivessel disease, a cardiothoracic surgical opinion will be pursued.  Findings Coronary Findings Diagnostic  Dominance: Co-dominant  Left Anterior Descending Mid LAD to Dist LAD lesion is 80% stenosed.  First Diagonal Branch 1st Diag lesion is 80% stenosed.  Right Coronary Artery Prox RCA to Mid RCA lesion is 70% stenosed.  Intervention  No interventions have been documented.   STRESS TESTS  NM PET CT CARDIAC PERFUSION MULTI  W/ABSOLUTE BLOODFLOW 06/15/2023  Narrative   Reversible perfusion defect in anterior wall consistent with ischemia.  Myocardial blood flow reserve is reduced (1.54), though may be inaccurate in setting of ESRD.  No drop in EF with stress or TID.  Mild LAD coronary calcifications on CT.  Overall, study suggests LAD ischemia and is high risk   LV perfusion is abnormal. There is evidence of ischemia. Defect 1: There is a medium defect with severe reduction in uptake present in the apical to basal anterior and apex location(s) that is reversible. There is normal wall motion in the defect area. Consistent with ischemia.   Rest left ventricular function is normal. Rest EF: 56%. Stress left ventricular function is normal. Stress EF: 60%. End diastolic cavity size is normal. End systolic cavity size is normal.   Myocardial blood flow was computed to be 0.24ml/g/min at rest and 0.97ml/g/min at stress. Global myocardial  blood flow reserve was 1.54 and was abnormal.   Coronary calcium  was present on the attenuation correction CT images. Mild coronary calcifications were present. Coronary calcifications were present in the left anterior descending artery distribution(s).   Findings are consistent with ischemia. The study is high risk.   Electronically signed by Carson Clara, MD  CLINICAL DATA:  This over-read does not include interpretation of cardiac or coronary anatomy or pathology. The Cardiac PET CT interpretation by the cardiologist is attached.  COMPARISON:  None Available.  FINDINGS: Cardiovascular: Large-bore multi lumen vascular catheter, tips in the right atrium. Normal heart size. Left coronary artery calcifications. No pericardial effusion. Scattered aortic atherosclerosis.  Limited Mediastinum/Nodes: No enlarged mediastinal, hilar, or axillary lymph nodes. Trachea and esophagus demonstrate no significant findings.  Limited Lungs/Pleura: Bibasilar scarring or partial atelectasis. No pleural effusion or pneumothorax.  Upper Abdomen: No acute abnormality.  Musculoskeletal: No chest wall abnormality. No acute osseous findings.  IMPRESSION: 1. No acute CT findings of the included chest. 2. Coronary artery disease.  Aortic Atherosclerosis (ICD10-I70.0).   Electronically Signed By: Fredricka Jenny M.D. On: 06/15/2023 14:02   ECHOCARDIOGRAM  ECHOCARDIOGRAM COMPLETE 10/31/2023  Narrative ECHOCARDIOGRAM REPORT    Patient Name:   MARCIANNA DAILY Date of Exam: 10/31/2023 Medical Rec #:  [id number]         Height:       69.0 in Accession #:    [id number]        Weight:       257.0 lb Date of Birth:  Oct 27, 1970         BSA:          2.298 m Patient Age:    52 years          BP:           171/64 mmHg Patient Gender: F                 HR:           76 bpm. Exam Location:  [facility]  Procedure: 2D Echo, Cardiac Doppler and Color Doppler (Both Spectral and Color Flow Doppler  were utilized during procedure).  Indications:    Elevated Troponin  History:        Patient has prior history of Echocardiogram examinations, most recent 09/08/2023. CAD; Risk Factors:Hypertension, Diabetes, Dyslipidemia and Former Smoker. ESRD on dialysis.  Sonographer:    Denese Finn RCS Referring Phys: [phone] CARLOS MADERA  IMPRESSIONS   1. Distal septal hypokinesis . Left ventricular ejection fraction, by estimation, is 55%. The left ventricle has  low normal function. The left ventricle has no regional wall motion abnormalities. Left ventricular diastolic parameters were normal. 2. Right ventricular systolic function is normal. The right ventricular size is normal. 3. Left atrial size was moderately dilated. 4. Right atrial size was ? dialysis catheter in RA. 5. The mitral valve is abnormal. Trivial mitral valve regurgitation. No evidence of mitral stenosis. Moderate mitral annular calcification. 6. The aortic valve is tricuspid. There is moderate calcification of the aortic valve. There is moderate thickening of the aortic valve. Aortic valve regurgitation is not visualized. Mild aortic valve stenosis. 7. The inferior vena cava is normal in size with greater than 50% respiratory variability, suggesting right atrial pressure of 3 mmHg.  FINDINGS Left Ventricle: Distal septal hypokinesis. Left ventricular ejection fraction, by estimation, is 55%. The left ventricle has low normal function. The left ventricle has no regional wall motion abnormalities. Strain was performed and the global longitudinal strain is indeterminate. The left ventricular internal cavity size was normal in size. There is no left ventricular hypertrophy. Left ventricular diastolic parameters were normal.  Right Ventricle: The right ventricular size is normal. No increase in right ventricular wall thickness. Right ventricular systolic function is normal.  Left Atrium: Left atrial size was moderately dilated.  Right  Atrium: Right atrial size was ? dialysis catheter in RA.  Pericardium: There is no evidence of pericardial effusion.  Mitral Valve: The mitral valve is abnormal. There is mild thickening of the mitral valve leaflet(s). There is mild calcification of the mitral valve leaflet(s). Moderate mitral annular calcification. Trivial mitral valve regurgitation. No evidence of mitral valve stenosis.  Tricuspid Valve: The tricuspid valve is normal in structure. Tricuspid valve regurgitation is not demonstrated. No evidence of tricuspid stenosis.  Aortic Valve: The aortic valve is tricuspid. There is moderate calcification of the aortic valve. There is moderate thickening of the aortic valve. Aortic valve regurgitation is not visualized. Mild aortic stenosis is present. Aortic valve mean gradient measures 6.0 mmHg. Aortic valve peak gradient measures 14.0 mmHg. Aortic valve area, by VTI measures 1.69 cm.  Pulmonic Valve: The pulmonic valve was normal in structure. Pulmonic valve regurgitation is trivial. No evidence of pulmonic stenosis.  Aorta: The aortic root is normal in size and structure.  Venous: The inferior vena cava is normal in size with greater than 50% respiratory variability, suggesting right atrial pressure of 3 mmHg.  IAS/Shunts: No atrial level shunt detected by color flow Doppler.  Additional Comments: 3D was performed not requiring image post processing on an independent workstation and was indeterminate.   LEFT VENTRICLE PLAX 2D LVIDd:         5.30 cm   Diastology LVIDs:         3.00 cm   LV e' medial:    6.74 cm/s LV PW:         1.10 cm   LV E/e' medial:  26.3 LV IVS:        1.00 cm   LV e' lateral:   9.46 cm/s LVOT diam:     1.70 cm   LV E/e' lateral: 18.7 LV SV:         77 LV SV Index:   33 LVOT Area:     2.27 cm   RIGHT VENTRICLE RV S prime:     13.30 cm/s TAPSE (M-mode): 2.4 cm  LEFT ATRIUM             Index        RIGHT ATRIUM  Index LA diam:        5.20  cm 2.26 cm/m   RA Area:     15.40 cm LA Vol (A2C):   82.7 ml 35.98 ml/m  RA Volume:   41.00 ml  17.84 ml/m LA Vol (A4C):   76.8 ml 33.42 ml/m LA Biplane Vol: 84.4 ml 36.72 ml/m AORTIC VALVE AV Area (Vmax):    2.00 cm AV Area (Vmean):   1.82 cm AV Area (VTI):     1.69 cm AV Vmax:           187.00 cm/s AV Vmean:          116.000 cm/s AV VTI:            0.454 m AV Peak Grad:      14.0 mmHg AV Mean Grad:      6.0 mmHg LVOT Vmax:         165.00 cm/s LVOT Vmean:        93.000 cm/s LVOT VTI:          0.339 m LVOT/AV VTI ratio: 0.75  AORTA Ao Root diam: 3.10 cm  MITRAL VALVE                TRICUSPID VALVE MV Area (PHT): 4.60 cm     TR Peak grad:   41.7 mmHg MV Decel Time: 165 msec     TR Vmax:        323.00 cm/s MR Peak grad: 173.2 mmHg MR Mean grad: 126.0 mmHg    SHUNTS MR Vmax:      658.00 cm/s   Systemic VTI:  0.34 m MR Vmean:     532.0 cm/s    Systemic Diam: 1.70 cm MV E velocity: 177.00 cm/s MV A velocity: 133.00 cm/s MV E/A ratio:  1.33  Janelle Mediate MD Electronically signed by Janelle Mediate MD Signature Date/Time: 10/31/2023/11:54:49 AM    Final   TEE  ECHO TEE 09/08/2023  Narrative TRANSESOPHOGEAL ECHO REPORT    Patient Name:   KAHLIE DEUTSCHER Date of Exam: 09/08/2023 Medical Rec #:  [id number]         Height:       69.0 in Accession #:    [id number]        Weight:       261.7 lb Date of Birth:  04-16-1971         BSA:          2.316 m Patient Age:    52 years          BP:           182/74 mmHg Patient Gender: F                 HR:           72 bpm. Exam Location:  [facility]  Procedure: Transesophageal Echo, Cardiac Doppler and Color Doppler (Both Spectral and Color Flow Doppler were utilized during procedure).  Indications:    Bacteremia R78.81  History:        Patient has prior history of Echocardiogram examinations, most recent 09/07/2023. CAD; Risk Factors:Hypertension, Diabetes, Dyslipidemia and Former Smoker. ESRD on dailysis.  Sonographer:     Denese Finn RCS Referring Phys: [phone] Metta Actis  PROCEDURE: The transesophogeal probe was passed without difficulty through the esophogus of the patient. Sedation performed by different physician. Image quality was excellent. The patient developed no complications during the procedure.  IMPRESSIONS   1. Left ventricular  ejection fraction, by estimation, is 60 to 65%. The left ventricle has normal function. 2. Right ventricular systolic function is normal. The right ventricular size is normal. 3. Left atrial size was mildly dilated. No left atrial/left atrial appendage thrombus was detected. The LAA emptying velocity was 60 cm/s. 4. The mitral valve is normal in structure. Trivial mitral valve regurgitation. No evidence of mitral stenosis. 5. The aortic valve is tricuspid. Aortic valve regurgitation is not visualized. No aortic stenosis is present.  Conclusion(s)/Recommendation(s): No evidence of vegetation/infective endocarditis on this transesophageael echocardiogram.  FINDINGS Left Ventricle: Left ventricular ejection fraction, by estimation, is 60 to 65%. The left ventricle has normal function. The left ventricular internal cavity size was normal in size.  Right Ventricle: The right ventricular size is normal. No increase in right ventricular wall thickness. Right ventricular systolic function is normal.  Left Atrium: Left atrial size was mildly dilated. No left atrial/left atrial appendage thrombus was detected. The LAA emptying velocity was 60 cm/s.  Right Atrium: Right atrial size was normal in size.  Pericardium: There is no evidence of pericardial effusion.  Mitral Valve: The mitral valve is normal in structure. Trivial mitral valve regurgitation. No evidence of mitral valve stenosis.  Tricuspid Valve: The tricuspid valve is normal in structure. Tricuspid valve regurgitation is mild . No evidence of tricuspid stenosis.  Aortic Valve: The aortic valve is tricuspid.  Aortic valve regurgitation is not visualized. No aortic stenosis is present.  Pulmonic Valve: The pulmonic valve was normal in structure. Pulmonic valve regurgitation is not visualized. No evidence of pulmonic stenosis.  Aorta: The aortic root is normal in size and structure.  IAS/Shunts: No atrial level shunt detected by color flow Doppler.    AORTA Ao Root diam: 2.90 cm  Armida Lander MD Electronically signed by Armida Lander MD Signature Date/Time: 09/08/2023/3:25:16 PM    Final        ______________________________________________________________________________________________     Recent Labs: 03/29/2023: TSH 0.018 10/31/2023: ALT 10 11/03/2023: BUN 25; Creatinine, Ser 3.93; Hemoglobin 9.4; Magnesium  1.7; Platelets 221; Potassium 3.3; Sodium 133  Recent Lipid Panel    Component Value Date/Time   CHOL 151 08/15/2023 0000   TRIG 67 08/15/2023 0000   HDL 79 08/15/2023 0000   CHOLHDL 1.9 08/15/2023 0000   CHOLHDL 3.2 09/12/2021 0338   VLDL 28 09/12/2021 0338   LDLCALC 59 08/15/2023 0000    History of Present Illness    53 year old female with the above past medical history including CAD s/p DES-D1, DES x 2-m-dLAD, DES-mRCA in 08/2023, mild aortic stenosis, hypertension, hyperlipidemia, type 1 diabetes, ESRD on HD, hypothyroidism, OSA, rheumatoid arthritis, bipolar disorder and prior tobacco use.  She had an abnormal cardiac PET stress test in 06/2023.  Follow-up cardiac catheterization in 07/2023  revealed severe two-vessel CAD.  Echocardiogram at that time showed EF 55%, normal LV function, no RWMA, G1 DD, normal RV systolic function, no significant valvular abnormalities.  She was evaluated by CT surgery and was not considered a candidate for CABG.  Therefore, she underwent staged PCI with DES-D1, DES x 2-m-dLAD, DES-mRCA in 08/2023.  He was hospitalized in 09/2023 in the setting of sepsis secondary to MRSA bacteremia to right thigh abscess s/p I&D.  She underwent TEE  which was negative for vegetation.  She was hospitalized again in May 2025 in the setting of NSTEMI, DKA in the setting of insulin  pump failure.  Cardiology was consulted.  Medical management was advised. She was treated with heparin  x 48 hours.  Echocardiogram showed EF 55%, normal LV function, no RWMA, normal RV systolic function, moderate MAC, mild aortic valve stenosis, mean gradient 6 mmHg.  She was discharged home in stable condition on 11/03/2023.  She presents today for follow-up accompanied by her husband.  Since her recent hospitalizations she has been stable from a cardiac standpoint.  She notes occasional mild dyspnea on exertion, she denies chest pain, palpitations, dizziness, edema, PND, orthopnea, weight gain. Overall, reports feeling well.    Home Medications    Current Outpatient Medications  Medication Sig Dispense Refill   acetaminophen  (TYLENOL ) 500 MG tablet Take 500-1,000 mg by mouth every 6 (six) hours as needed (pain.).     aspirin  EC 81 MG tablet Take 1 tablet (81 mg total) by mouth daily with breakfast. Swallow whole. 30 tablet 12   azelastine  (ASTELIN ) 0.1 % nasal spray Place 2 sprays into both nostrils 2 (two) times daily. Use in each nostril as directed (Patient taking differently: Place 2 sprays into both nostrils daily as needed for allergies or rhinitis.) 30 mL 2   Blood Glucose Monitoring Suppl DEVI 1 each by Does not apply route 3 (three) times daily. May dispense any manufacturer covered by patient's insurance. 1 each 0   buPROPion  (WELLBUTRIN  XL) 150 MG 24 hr tablet Take 1 tablet (150 mg total) by mouth daily. 30 tablet 3   carvedilol  (COREG ) 12.5 MG tablet Take 12.5 mg by mouth 2 (two) times daily with a meal.     Cholecalciferol  (VITAMIN D -3 PO) Take 1 tablet by mouth in the morning.     clopidogrel  (PLAVIX ) 75 MG tablet Take 1 tablet (75 mg total) by mouth daily. 30 tablet 5   Continuous Blood Gluc Sensor MISC 1 each by Does not apply route as directed. Use as  directed every 14 days. May dispense FreeStyle [employer] or similar.     diclofenac  Sodium (VOLTAREN ) 1 % GEL Apply 2 g topically 4 (four) times daily as needed (pain.).     DULoxetine  (CYMBALTA ) 30 MG capsule Take 1 capsule (30 mg total) by mouth daily. 30 capsule 3   folic acid  (FOLVITE ) 800 MCG tablet Take 800 mcg by mouth in the morning.     glucagon (GLUCAGEN  HYPOKIT) 1 MG SOLR injection Inject 1 mg into the skin once as needed for up to 1 dose for low blood sugar. GlucaGen  HypoKit 1 mg Injection 1 each 3   Glucose Blood (BLOOD GLUCOSE TEST STRIPS) STRP 1 each by Does not apply route 3 (three) times daily. Use as directed to check blood sugar. May dispense any manufacturer covered by patient's insurance and fits patient's device. 100 strip 0   hydrALAZINE  (APRESOLINE ) 25 MG tablet Take 3 tablets (75 mg total) by mouth 3 (three) times daily. 270 tablet 1   HYDROcodone -acetaminophen  (NORCO/VICODIN) 5-325 MG tablet Take 1 tablet by mouth every 6 (six) hours as needed for moderate pain (pain score 4-6). 10 tablet 0   insulin  aspart (NOVOLOG ) 100 UNIT/ML FlexPen If eating and Blood Glucose (BG) 80 or higher inject 0 units for meal coverage and add correction dose per scale. If not eating, correction dose only. BG <150= 0 unit; BG 150-200= 1 unit; BG 201-250= 2 unit; BG 251-300= 3 unit; BG 301-350= 4 unit; BG 351-400= 5 unit; BG >400= 6 unit and Call Primary Care. (When not using pump) 15 mL 0   insulin  degludec (TRESIBA ) 100 UNIT/ML FlexTouch Pen Inject 50 Units into the skin daily. May substitute as needed  per insurance. When not using pump. 15 mL 0   Insulin  Pen Needle (PEN NEEDLES) 31G X 5 MM MISC 1 each by Does not apply route 3 (three) times daily. May dispense any manufacturer covered by patient's insurance. 100 each 0   isosorbide  mononitrate (IMDUR ) 30 MG 24 hr tablet Take 1 tablet (30 mg total) by mouth daily. 30 tablet 1   lamoTRIgine  (LAMICTAL ) 200 MG tablet Take 1 tablet (200 mg  total) by mouth every evening. 30 tablet 5   Lancet Device MISC 1 each by Does not apply route 3 (three) times daily. May dispense any manufacturer covered by patient's insurance. 1 each 0   Lancets MISC 1 each by Does not apply route 3 (three) times daily. Use as directed to check blood sugar. May dispense any manufacturer covered by patient's insurance and fits patient's device. 100 each 0   levothyroxine  (SYNTHROID ) 175 MCG tablet Take 1.5 tablets (mcg) by mouth on Sundays in the mornings & take 1 tablet (175 mcg) by mouth on all other days.     LORazepam  (ATIVAN ) 0.5 MG tablet Take 1 tablet (0.5 mg total) by mouth daily as needed for anxiety. 30 tablet 0   Melatonin 10 MG TABS Take 10 mg by mouth at bedtime.     mupirocin  ointment (BACTROBAN ) 2 % Apply 1 Application topically 2 (two) times daily. 30 g 1   NOVOLOG  100 UNIT/ML injection Inject into the skin continuous. Sliding Scale Insulin  pump 2.5 units basal Bolus with meal depending on the size     ondansetron  (ZOFRAN -ODT) 4 MG disintegrating tablet Take 4 mg by mouth every 8 (eight) hours as needed for nausea or vomiting.     OXYGEN Inhale 3 L into the lungs at bedtime.     PRESCRIPTION MEDICATION See admin instructions.  IDPN LV CUSTOM (100GM)   INFUSE 1 BAG DURING HD 3 TIMES WEEKLY AS FOLLOWS 1ST WEEK 55ML/HR; 2ND WEEK 110ML/HR; 3RD WEEK 163ML/HR; THEN CONTINUE WITH GOAL RATE= 163ML/HR OVER 3HRS . TITRATE AS TOLERATED. ADMINISTER IV VIA VENOUS DRIP CHAMBER.     rizatriptan  (MAXALT ) 10 MG tablet Take 10 mg by mouth as needed for migraine. May repeat in 2 hours if needed     rosuvastatin  (CRESTOR ) 10 MG tablet Take 1 tablet (10 mg total) by mouth daily. (Patient taking differently: Take 10 mg by mouth every evening.) 90 tablet 3   sevelamer  carbonate (RENVELA ) 800 MG tablet Take 1,600 mg by mouth 3 (three) times daily with meals.     torsemide  (DEMADEX ) 20 MG tablet Take 40 mg by mouth every Monday, Wednesday, and Friday.      No current facility-administered medications for this visit.     Review of Systems    She denies chest pain, palpitations, pnd, orthopnea, n, v, dizziness, syncope, edema, weight gain, or early satiety. All other systems reviewed and are otherwise negative except as noted above.   Physical Exam    VS:  BP 126/68   Pulse 67   Ht 5' 9 (1.753 m)   Wt 264 lb (119.7 kg)   LMP 03/23/2017 (Approximate)   SpO2 96%   BMI 38.99 kg/m  , GEN: Well nourished, well developed, in no acute distress. HEENT: normal. Neck: Supple, no JVD, carotid bruits, or masses. Cardiac: RRR, no murmurs, rubs, or gallops. No clubbing, cyanosis, edema.  Radials/DP/PT 2+ and equal bilaterally.  Respiratory:  Respirations regular and unlabored, clear to auscultation bilaterally. GI: Soft, nontender, nondistended, BS + x 4.  MS: no deformity or atrophy. Skin: warm and dry, no rash. Neuro:  Strength and sensation are intact. Psych: Normal affect.  Accessory Clinical Findings    ECG personally reviewed by me today -    - no EKG in office today.   Lab Results  Component Value Date   WBC 9.3 11/03/2023   HGB 9.4 (L) 11/03/2023   HCT 29.6 (L) 11/03/2023   MCV 93.4 11/03/2023   PLT 221 11/03/2023   Lab Results  Component Value Date   CREATININE 3.93 (H) 11/03/2023   BUN 25 (H) 11/03/2023   NA 133 (L) 11/03/2023   K 3.3 (L) 11/03/2023   CL 99 11/03/2023   CO2 25 11/03/2023   Lab Results  Component Value Date   ALT 10 10/31/2023   AST 12 (L) 10/31/2023   ALKPHOS 126 10/31/2023   BILITOT 0.9 10/31/2023   Lab Results  Component Value Date   CHOL 151 08/15/2023   HDL 79 08/15/2023   LDLCALC 59 08/15/2023   TRIG 67 08/15/2023   CHOLHDL 1.9 08/15/2023    Lab Results  Component Value Date   HGBA1C 5.6 03/28/2023    Assessment & Plan    1. CAD/NSTEMI: Abnormal cardiac PET stress test in 06/2023.  Follow-up cardiac catheterization in 07/2023 revealed severe two-vessel CAD, she was not considered  a candidate for CABG. Now s/p staged PCI with DES-D1, DES x 2-m-dLAD, DES-mRCA in 08/2023.  She was hospitalized in May 2025 in the setting of DKA, troponin was elevated, she was treated with heparin  x 48 hours, medical management was advised. Echocardiogram in 10/2023 showed EF 55%, normal LV function, no RWMA, normal RV systolic function, moderate MAC, mild aortic valve stenosis, mean gradient 6 mmHg.  She reports occasional mild dyspnea on exertion, denies chest pain.  Euvolemic, compensated on exam.  Reviewed ED precautions.  Continue aspirin , Plavix , carvedilol , hydralazine , Imdur ,, torsemide , Crestor .  2. Hypertension: BP well controlled. Continue current antihypertensive regimen.   3. Hyperlipidemia: LDL was 59 in 08/2023.  Recently transition to Crestor .  Will update fasting lipids, LFTs in 6 to 8 weeks.  Continue Crestor .  4. Aortic stenosis: Mild on most recent echo.  Mild dyspnea on exertion, generally euvolemic and well compensated. Consider repeat echo in 10/2024.  5. Type 1 diabetes: On insulin  pump with continuous glucose monitoring.  A1c was 5.6 in 03/2023.  Followed by Endocrinology.   6. ESRD on HD: On HD T, Thur, Sat. Followed by nephrology.   7. Hypothyroidism: TSH was 0.018 in 03/2023.  Stable on levothyroxine .  8. OSA: Adherent to CPAP. Managed by pulmonology.   9. Disposition: Follow-up in 3-4 months.       Jude Norton, NP 11/21/2023, 1:14 PM

## 2023-11-21 NOTE — Patient Instructions (Signed)
 Medication Instructions:  Your physician recommends that you continue on your current medications as directed. Please refer to the Current Medication list given to you today.  *If you need a refill on your cardiac medications before your next appointment, please call your pharmacy*  Lab Work: Fasting lipid panel & LFTS prior to your next apt  Testing/Procedures: NONE ordered at this time of appointment    Follow-Up: At [facility], you and your health needs are our priority.  As part of our continuing mission to provide you with exceptional heart care, our providers are all part of one team.  This team includes your primary Cardiologist (physician) and Advanced Practice Providers or APPs (Physician Assistants and Nurse Practitioners) who all work together to provide you with the care you need, when you need it.  Your next appointment:   3-4 month(s)  Provider:   Arun K Thukkani, MD or Marlana Silvan, NP          We recommend signing up for the patient portal called MyChart.  Sign up information is provided on this After Visit Summary.  MyChart is used to connect with patients for Virtual Visits (Telemedicine).  Patients are able to view lab/test results, encounter notes, upcoming appointments, etc.  Non-urgent messages can be sent to your provider as well.   To learn more about what you can do with MyChart, go to [email or web address].

## 2023-11-23 ENCOUNTER — Other Ambulatory Visit: Payer: Self-pay | Admitting: Surgery

## 2023-11-23 ENCOUNTER — Other Ambulatory Visit: Payer: Self-pay

## 2023-11-23 DIAGNOSIS — N184 Chronic kidney disease, stage 4 (severe): Secondary | ICD-10-CM

## 2023-11-23 DIAGNOSIS — N186 End stage renal disease: Secondary | ICD-10-CM
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
## 2023-11-28 ENCOUNTER — Encounter

## 2023-11-28 ENCOUNTER — Other Ambulatory Visit: Payer: Self-pay

## 2023-11-28 ENCOUNTER — Encounter (HOSPITAL_COMMUNITY): Payer: Self-pay

## 2023-11-28 ENCOUNTER — Emergency Department (HOSPITAL_COMMUNITY)
Admission: EM | Admit: 2023-11-28 | Discharge: 2023-11-28 | Disposition: A | Discharge status: Home / Self Care | Attending: Emergency Medicine | Admitting: Emergency Medicine

## 2023-11-28 ENCOUNTER — Encounter (HOSPITAL_COMMUNITY)

## 2023-11-28 ENCOUNTER — Encounter (HOSPITAL_COMMUNITY): Payer: Self-pay | Admitting: Vascular Surgery

## 2023-11-28 DIAGNOSIS — Z992 Dependence on renal dialysis: Secondary | ICD-10-CM | POA: Insufficient documentation

## 2023-11-28 DIAGNOSIS — T829XXA Unspecified complication of cardiac and vascular prosthetic device, implant and graft, initial encounter: Secondary | ICD-10-CM

## 2023-11-28 DIAGNOSIS — Z7902 Long term (current) use of antithrombotics/antiplatelets: Secondary | ICD-10-CM | POA: Diagnosis not present

## 2023-11-28 DIAGNOSIS — Y828 Other medical devices associated with adverse incidents: Secondary | ICD-10-CM | POA: Diagnosis not present

## 2023-11-28 DIAGNOSIS — Z794 Long term (current) use of insulin: Secondary | ICD-10-CM | POA: Insufficient documentation

## 2023-11-28 DIAGNOSIS — T8242XA Displacement of vascular dialysis catheter, initial encounter: Secondary | ICD-10-CM | POA: Insufficient documentation

## 2023-11-28 DIAGNOSIS — Z7982 Long term (current) use of aspirin: Secondary | ICD-10-CM | POA: Insufficient documentation

## 2023-11-28 DIAGNOSIS — Z79899 Other long term (current) drug therapy: Secondary | ICD-10-CM | POA: Insufficient documentation

## 2023-11-28 NOTE — ED Triage Notes (Signed)
 Pt arrived via [facility] from home c/o left chest port access was accidentally pulled out in her sleep last night. Bleeding is controled at this time. Pt reports in the past, her port dialysis access was in her right chest and she never had issues with is.

## 2023-11-28 NOTE — Discharge Instructions (Signed)
 Our colleagues in the vascular surgery clinic will contact you today or tomorrow, possibly Wednesday morning for scheduling a new dialysis access catheter.  Return here for concerning changes in your condition.

## 2023-11-28 NOTE — ED Provider Notes (Signed)
 [place] EMERGENCY DEPARTMENT AT [facility] Provider Note   CSN: [id number] Arrival date & time: 11/28/23  1249     Patient presents with: Vascular Access Problem   Jasmine [name] is a 53 y.o. female.   HPI Adult female presents with concern for dislodged dialysis catheter.  She notes that she awoke with her catheter out of place.  No chest pain, no dyspnea, no fever, no chills, no surrounding erythema or cutaneous changes.  Patient had stage I of a left AV dialysis access last month, has not yet had the secondary stage. She has been receiving dialysis through her chest catheter, and is scheduled for her next session tomorrow.    Prior to Admission medications   Medication Sig Start Date End Date Taking? Authorizing Provider  acetaminophen  (TYLENOL ) 500 MG tablet Take 500-1,000 mg by mouth every 6 (six) hours as needed (pain.).    [provider]  aspirin  EC 81 MG tablet Take 1 tablet (81 mg total) by mouth daily with breakfast. Swallow whole. 09/09/23   Pearlean Manus, MD  azelastine  (ASTELIN ) 0.1 % nasal spray Place 2 sprays into both nostrils 2 (two) times daily. Use in each nostril as directed Patient taking differently: Place 2 sprays into both nostrils daily as needed for allergies or rhinitis. 08/01/23   Hope Almarie ORN, NP  Blood Glucose Monitoring Suppl DEVI 1 each by Does not apply route 3 (three) times daily. May dispense any manufacturer covered by patient's insurance. 11/03/23   Maree, Pratik D, DO  buPROPion  (WELLBUTRIN  XL) 150 MG 24 hr tablet Take 1 tablet (150 mg total) by mouth daily. 10/28/23 02/25/24  Vickey Mettle, MD  carvedilol  (COREG ) 12.5 MG tablet Take 12.5 mg by mouth 2 (two) times daily with a meal. 01/17/23 01/17/24  [provider]  Cholecalciferol  (VITAMIN D -3 PO) Take 1 tablet by mouth in the morning.    [provider]  clopidogrel  (PLAVIX ) 75 MG tablet Take 1 tablet (75 mg total) by mouth daily. 02/22/23   Vannie Reche RAMAN, NP  Continuous Blood Gluc Sensor MISC 1 each by Does not apply route as directed. Use as directed every 14 days. May dispense FreeStyle [employer] or similar.    [provider]  diclofenac  Sodium (VOLTAREN ) 1 % GEL Apply 2 g topically 4 (four) times daily as needed (pain.).    [provider]  DULoxetine  (CYMBALTA ) 30 MG capsule Take 1 capsule (30 mg total) by mouth daily. 10/11/23 02/08/24  Vickey Mettle, MD  folic acid  (FOLVITE ) 800 MCG tablet Take 800 mcg by mouth in the morning.    [provider]  glucagon (GLUCAGEN  HYPOKIT) 1 MG SOLR injection Inject 1 mg into the skin once as needed for up to 1 dose for low blood sugar. GlucaGen  HypoKit 1 mg Injection 03/31/23   Johnson, Clanford L, MD  Glucose Blood (BLOOD GLUCOSE TEST STRIPS) STRP 1 each by Does not apply route 3 (three) times daily. Use as directed to check blood sugar. May dispense any manufacturer covered by patient's insurance and fits patient's device. 11/03/23   Maree, Pratik D, DO  hydrALAZINE  (APRESOLINE ) 25 MG tablet Take 3 tablets (75 mg total) by mouth 3 (three) times daily. 11/03/23   Maree, Pratik D, DO  HYDROcodone -acetaminophen  (NORCO/VICODIN) 5-325 MG tablet Take 1 tablet by mouth every 6 (six) hours as needed for moderate pain (pain score 4-6). 10/14/23   Bethanie Cough, PA-C  insulin  aspart (NOVOLOG ) 100 UNIT/ML FlexPen If eating  and Blood Glucose (BG) 80 or higher inject 0 units for meal coverage and add correction dose per scale. If not eating, correction dose only. BG <150= 0 unit; BG 150-200= 1 unit; BG 201-250= 2 unit; BG 251-300= 3 unit; BG 301-350= 4 unit; BG 351-400= 5 unit; BG >400= 6 unit and Call Primary Care. (When not using pump) 11/03/23   Maree, Pratik D, DO  insulin  degludec (TRESIBA ) 100 UNIT/ML FlexTouch Pen Inject 50 Units into the skin daily. May substitute as needed per insurance. When not using pump. 11/03/23   Maree, Pratik D, DO  Insulin  Pen Needle (PEN NEEDLES) 31G X 5  MM MISC 1 each by Does not apply route 3 (three) times daily. May dispense any manufacturer covered by patient's insurance. 11/03/23   Maree, Pratik D, DO  isosorbide  mononitrate (IMDUR ) 30 MG 24 hr tablet Take 1 tablet (30 mg total) by mouth daily. 11/04/23   Maree, Pratik D, DO  lamoTRIgine  (LAMICTAL ) 200 MG tablet Take 1 tablet (200 mg total) by mouth every evening. 10/21/23 04/18/24  Vickey Mettle, MD  Lancet Device MISC 1 each by Does not apply route 3 (three) times daily. May dispense any manufacturer covered by patient's insurance. 11/03/23   Maree Bracken D, DO  Lancets MISC 1 each by Does not apply route 3 (three) times daily. Use as directed to check blood sugar. May dispense any manufacturer covered by patient's insurance and fits patient's device. 11/03/23   Maree, Pratik D, DO  levothyroxine  (SYNTHROID ) 175 MCG tablet Take 1.5 tablets (mcg) by mouth on Sundays in the mornings & take 1 tablet (175 mcg) by mouth on all other days. 11/20/21   [provider]  LORazepam  (ATIVAN ) 0.5 MG tablet Take 1 tablet (0.5 mg total) by mouth daily as needed for anxiety. 11/11/23 12/11/23  Vickey Mettle, MD  Melatonin 10 MG TABS Take 10 mg by mouth at bedtime.    [provider]  mupirocin  ointment (BACTROBAN ) 2 % Apply 1 Application topically 2 (two) times daily. 11/07/23   Melvenia Corean SAILOR, NP  NOVOLOG  100 UNIT/ML injection Inject into the skin continuous. Sliding Scale Insulin  pump 2.5 units basal Bolus with meal depending on the size 03/15/22   [provider]  ondansetron  (ZOFRAN -ODT) 4 MG disintegrating tablet Take 4 mg by mouth every 8 (eight) hours as needed for nausea or vomiting. 03/17/22   [provider]  OXYGEN Inhale 3 L into the lungs at bedtime.    [provider]  PRESCRIPTION MEDICATION See admin instructions.  IDPN LV CUSTOM (100GM)   INFUSE 1 BAG DURING HD 3 TIMES WEEKLY AS FOLLOWS 1ST WEEK 55ML/HR; 2ND WEEK 110ML/HR; 3RD WEEK 163ML/HR; THEN  CONTINUE WITH GOAL RATE= 163ML/HR OVER 3HRS . TITRATE AS TOLERATED. ADMINISTER IV VIA VENOUS DRIP CHAMBER.    [provider]  rizatriptan  (MAXALT ) 10 MG tablet Take 10 mg by mouth as needed for migraine. May repeat in 2 hours if needed    [provider]  rosuvastatin  (CRESTOR ) 10 MG tablet Take 1 tablet (10 mg total) by mouth daily. Patient taking differently: Take 10 mg by mouth every evening. 07/29/23 11/21/23  Lonni Slain, MD  sevelamer  carbonate (RENVELA ) 800 MG tablet Take 1,600 mg by mouth 3 (three) times daily with meals. 07/28/23   [provider]  torsemide  (DEMADEX ) 20 MG tablet Take 40 mg by mouth every Monday, Wednesday, and Friday.    [provider]    Allergies: Ciprofloxacin , Levaquin [levofloxacin], Linaclotide, Promethazine ,  Buspar [buspirone], Advair diskus [fluticasone -salmeterol], Biaxin [clarithromycin], and Hydroxyzine    Review of Systems  Updated Vital Signs BP (!) 145/59 (BP Location: Right Arm)   Pulse 66   Temp 98.2 F (36.8 C) (Oral)   Resp 16   Ht 5' 9 (1.753 m)   Wt 119.7 kg   LMP 03/23/2017 (Approximate)   SpO2 (!) 89%   BMI 38.99 kg/m   Physical Exam Vitals and nursing note reviewed.  Constitutional:      General: She is not in acute distress.    Appearance: She is well-developed.  HENT:     Head: Normocephalic and atraumatic.   Eyes:     Conjunctiva/sclera: Conjunctivae normal.   Pulmonary:     Effort: Pulmonary effort is normal. No respiratory distress.     Breath sounds: No stridor.  Abdominal:     General: There is no distension.   Skin:    General: Skin is warm and dry.   Neurological:     Mental Status: She is alert and oriented to person, place, and time.     Cranial Nerves: No cranial nerve deficit.   Psychiatric:        Mood and Affect: Mood normal.     (all labs ordered are listed, but only abnormal results are displayed) Labs Reviewed - No data to  display  EKG: None  Radiology: No results found.   Procedures   Medications Ordered in the ED - No data to display                                  Medical Decision Making Patient without physical complaints, without hemodynamic instability, with charted O2 sat 89%, but poor waveform, and without any dyspnea presents with concern for dislodged catheter.  Patient is awake, alert, has no other complaints, I discussed case with our vascular team, are made arrangements for expedited outpatient vascular procedure.  No indication for emergent dialysis.  Patient discharged in stable condition.  Amount and/or Complexity of Data Reviewed External Data Reviewed: notes.    Details: As above  Risk Diagnosis or treatment significantly limited by social determinants of health.   Final diagnoses:  Complication associated with dialysis catheter    ED Discharge Orders     None          Garrick Charleston, MD 11/28/23 1421

## 2023-11-28 NOTE — ED Triage Notes (Signed)
 Pt reports she receives dialysis on Tues, Thurs, and Saturdays through [facility] in [place]. Pt denies pain or feeling SOB at this time.

## 2023-11-29 ENCOUNTER — Encounter: Payer: Self-pay | Admitting: Psychiatry

## 2023-11-29 ENCOUNTER — Telehealth (INDEPENDENT_AMBULATORY_CARE_PROVIDER_SITE_OTHER): Admitting: Psychiatry

## 2023-11-29 DIAGNOSIS — F411 Generalized anxiety disorder: Secondary | ICD-10-CM

## 2023-11-29 DIAGNOSIS — F3181 Bipolar II disorder: Secondary | ICD-10-CM | POA: Diagnosis not present

## 2023-11-29 DIAGNOSIS — F603 Borderline personality disorder: Secondary | ICD-10-CM | POA: Diagnosis not present

## 2023-11-29 NOTE — Patient Instructions (Signed)
 Continue lamotrigine  200 mg daily  Continue duloxetine  30 mg daily  Continue bupropion  150 mg daily  Continue lorazepam  0.5 mg daily as needed for anxiety   Next appointment: 8/20 at 4 pm

## 2023-11-30 ENCOUNTER — Encounter (HOSPITAL_COMMUNITY)
Admission: RE | Disposition: A | Discharge status: Home / Self Care | Payer: Self-pay | Source: Home / Self Care | Attending: Vascular Surgery

## 2023-11-30 ENCOUNTER — Other Ambulatory Visit: Payer: Self-pay

## 2023-11-30 ENCOUNTER — Ambulatory Visit (HOSPITAL_COMMUNITY)
Admission: RE | Admit: 2023-11-30 | Discharge: 2023-11-30 | Disposition: A | Discharge status: Home / Self Care | Attending: Vascular Surgery | Admitting: Vascular Surgery

## 2023-11-30 DIAGNOSIS — Z87891 Personal history of nicotine dependence: Secondary | ICD-10-CM | POA: Insufficient documentation

## 2023-11-30 DIAGNOSIS — N186 End stage renal disease: Secondary | ICD-10-CM | POA: Diagnosis not present

## 2023-11-30 DIAGNOSIS — T8242XA Displacement of vascular dialysis catheter, initial encounter: Secondary | ICD-10-CM | POA: Diagnosis present

## 2023-11-30 DIAGNOSIS — Z992 Dependence on renal dialysis: Secondary | ICD-10-CM | POA: Diagnosis not present

## 2023-11-30 DIAGNOSIS — E1022 Type 1 diabetes mellitus with diabetic chronic kidney disease: Secondary | ICD-10-CM | POA: Diagnosis not present

## 2023-11-30 DIAGNOSIS — I120 Hypertensive chronic kidney disease with stage 5 chronic kidney disease or end stage renal disease: Secondary | ICD-10-CM | POA: Insufficient documentation

## 2023-11-30 DIAGNOSIS — Y839 Surgical procedure, unspecified as the cause of abnormal reaction of the patient, or of later complication, without mention of misadventure at the time of the procedure: Secondary | ICD-10-CM | POA: Insufficient documentation

## 2023-11-30 HISTORY — PX: DIALYSIS/PERMA CATHETER INSERTION: CATH118288

## 2023-11-30 LAB — GLUCOSE, CAPILLARY: Glucose-Capillary: 198 mg/dL — ABNORMAL HIGH (ref 70–99)

## 2023-11-30 SURGERY — DIALYSIS/PERMA CATHETER INSERTION
Anesthesia: LOCAL

## 2023-11-30 MED ORDER — FENTANYL CITRATE (PF) 100 MCG/2ML IJ SOLN
INTRAMUSCULAR | Status: AC
  Filled 2023-11-30: qty 2

## 2023-11-30 MED ORDER — HEPARIN SODIUM (PORCINE) 1000 UNIT/ML IJ SOLN
INTRAMUSCULAR | Status: DC | PRN
  Administered 2023-11-30 (×2): 1900 [IU] via INTRAVENOUS

## 2023-11-30 MED ORDER — LIDOCAINE HCL (PF) 1 % IJ SOLN
INTRAMUSCULAR | Status: AC
  Filled 2023-11-30: qty 30

## 2023-11-30 MED ORDER — MIDAZOLAM HCL 2 MG/2ML IJ SOLN
INTRAMUSCULAR | Status: AC
Start: 2023-11-30 — End: 2023-11-30
  Filled 2023-11-30: qty 2

## 2023-11-30 MED ORDER — LIDOCAINE HCL (PF) 1 % IJ SOLN
INTRAMUSCULAR | Status: DC | PRN
  Administered 2023-11-30: 10 mL via SUBCUTANEOUS
  Administered 2023-11-30: 5 mL via SUBCUTANEOUS

## 2023-11-30 MED ORDER — MIDAZOLAM HCL 2 MG/2ML IJ SOLN
INTRAMUSCULAR | Status: DC | PRN
Start: 2023-11-30 — End: 2023-11-30
  Administered 2023-11-30: 1 mg via INTRAVENOUS

## 2023-11-30 MED ORDER — FENTANYL CITRATE (PF) 100 MCG/2ML IJ SOLN
INTRAMUSCULAR | Status: DC | PRN
  Administered 2023-11-30: 50 ug via INTRAVENOUS

## 2023-11-30 MED ORDER — HEPARIN SODIUM (PORCINE) 1000 UNIT/ML IJ SOLN
INTRAMUSCULAR | Status: AC
  Filled 2023-11-30: qty 10

## 2023-11-30 MED ORDER — HEPARIN (PORCINE) IN NACL 1000-0.9 UT/500ML-% IV SOLN
INTRAVENOUS | Status: DC | PRN
Start: 2023-11-30 — End: 2023-11-30
  Administered 2023-11-30: 500 mL

## 2023-11-30 SURGICAL SUPPLY — 5 items
CATH PALINDROME-P 23 W/VT (CATHETERS) IMPLANT
GLIDEWIRE ADV .035X180CM (WIRE) IMPLANT
KIT MICROPUNCTURE NIT STIFF (SHEATH) IMPLANT
SHEATH PROBE COVER 6X72 (BAG) IMPLANT
TRAY PV CATH (CUSTOM PROCEDURE TRAY) ×1 IMPLANT

## 2023-11-30 NOTE — Op Note (Signed)
    Patient name: Jasmine [name] MRN: [id number] DOB: [birth year] Sex: female  11/30/2023 Pre-operative Diagnosis: ESRD on HD, dislodged left IJ TDC Post-operative diagnosis:  Same Surgeon:  Norman GORMAN Serve, MD Procedure Performed:  Ultrasound and fluoroscopic guided left internal jugular TDC insertion 17 Minutes of moderate sedation with fentanyl  and Versed   Indications: [name] is a 53 year old female with ESRD on HD who presents to the hemodialysis access center with a recent dislodgment of her left IJ TDC.  She has had a first stage brachiobasilic fistula creation with Dr. Serene last month although this has not been transposed yet.  Risks and benefits of tunneled dialysis catheter insertion were reviewed, she expressed understanding and elected to proceed.  Findings:  Widely patent and compressible left internal jugular Successful 23 mm tunneled dialysis catheter placed in the RA    Procedure:   The patient was brought to the operating room positioned supine on operating room table.  The neck was prepped and draped in the usual sterile fashion.  Anesthesia was induced, preoperative antibiotics were administered and a timeout was performed. Using ultrasound guidance the left internal jugular vein was accessed with micropuncture technique.  Through the micropuncture sheath, the guidewire was advanced into the superior vena cava.  A small incision was made around the skin access point.  The access point was serially dilated under direct fluoroscopic guidance.  A peel-away sheath was introduced into the superior vena cava under fluoroscopic guidance.  A counterincision was made in the chest under the clavicle.  A 23 cm tunnel dialysis catheter was then tunneled under the skin, over the clavicle into the incision in the neck.  The tunneling device was removed and the catheter fed through the peel-away sheath into the superior vena cava.  The peel-away sheath was removed and the catheter  gently pulled back.  Adequate position was confirmed with x-ray.  The catheter was tested and found to aspirate and flush with ease.    The catheter was sutured to the skin and the neck incision was closed with 4-0 Monocryl.  The catheter was then capped and heparin  locked.  Sedation: 17 minutes     Norman GORMAN Serve MD [facility] Office: [phone]

## 2023-11-30 NOTE — H&P (Signed)
 HD ACCESS CENTER H&P   Patient ID: Jasmine [name], female   DOB: July 08, 1970, 53 y.o.   MRN: [id number]  Subjective:     HPI Jasmine [name] is a 53 y.o. female with ESRD presenting to the HD access center for intervention.  She is an urgent add-on as of her left IJ TDC came out accidentally 2 nights ago.  Past Medical History:  Diagnosis Date   Anemia    Anxiety    Arthritis    Asthma    Balance problems    Bipolar disorder (HCC)    Charcot ankle    Chronic fatigue    Coronary artery disease    Depression    DKA, type 1 (HCC) 11/04/2011   Elevated cholesterol    ESRD on hemodialysis (HCC)    TTS at [facility]   Fibromyalgia    GERD (gastroesophageal reflux disease)    Headache    History of suicidal ideation    Hyperlipemia    Hypertension    Hypothyroidism    IBS (irritable bowel syndrome)    Memory changes    Obesity    Sleep apnea    wears cpap   Stress incontinence    Pt had surgery to correct this.   Tobacco abuse    Tremor    Family History  Problem Relation Age of Onset   Asthma Mother    Bipolar disorder Mother    Heart disease Father    Lymphoma Father    Hypertension Father    Thyroid  disease Father    Hyperlipidemia Father    Diabetes Father    Cancer Paternal Grandmother        lung and breast   Bladder Cancer Paternal Grandfather    Suicidality Maternal Grandfather    Thyroid  disease Brother    Past Surgical History:  Procedure Laterality Date   AV FISTULA PLACEMENT Left 10/14/2023   Procedure: BRACHIOBASILIC ARTERIOVENOUS (AV) FISTULA CREATION LEFT;  Surgeon: Serene Gaile ORN, MD;  Location: [facility] OR;  Service: Vascular;  Laterality: Left;   CORONARY IMAGING/OCT N/A 08/30/2023   Procedure: CORONARY IMAGING/OCT;  Surgeon: Wendel Lurena POUR, MD;  Location: [facility] INVASIVE CV LAB;  Service: Cardiovascular;  Laterality: N/A;   CORONARY STENT INTERVENTION N/A 08/30/2023   Procedure: CORONARY STENT INTERVENTION;  Surgeon: Wendel Lurena POUR, MD;   Location: [facility] INVASIVE CV LAB;  Service: Cardiovascular;  Laterality: N/A;   INCONTINENCE SURGERY     IR FLUORO GUIDE CV LINE LEFT  09/10/2023   IR US  GUIDE VASC ACCESS LEFT  09/10/2023   LEFT HEART CATH AND CORONARY ANGIOGRAPHY N/A 07/15/2023   Procedure: LEFT HEART CATH AND CORONARY ANGIOGRAPHY;  Surgeon: Wendel Lurena POUR, MD;  Location: [facility] INVASIVE CV LAB;  Service: Cardiovascular;  Laterality: N/A;   LEFT HEART CATH AND CORONARY ANGIOGRAPHY N/A 08/30/2023   Procedure: LEFT HEART CATH AND CORONARY ANGIOGRAPHY;  Surgeon: Wendel Lurena POUR, MD;  Location: [facility] INVASIVE CV LAB;  Service: Cardiovascular;  Laterality: N/A;   NASAL FRACTURE SURGERY     ovary removed     OVARY SURGERY     PUBOVAGINAL SLING  08/16/2011   Procedure: CARLOYN GLADE;  Surgeon: Alm GORMAN Fragmin, MD;  Location: [facility] ORS;  Service: Urology;  Laterality: N/A;          TEE WITHOUT CARDIOVERSION N/A 09/08/2023   Procedure: ECHOCARDIOGRAM, TRANSESOPHAGEAL;  Surgeon: Alvan Dorn FALCON, MD;  Location: [facility] ORS;  Service: Endoscopy;  Laterality: N/A;   TUNNELLED CATHETER EXCHANGE  N/A 09/30/2023   Procedure: TUNNELLED CATHETER EXCHANGE;  Surgeon: Melia Lynwood ORN, MD;  Location: [facility] INVASIVE CV LAB;  Service: Cardiovascular;  Laterality: N/A;   TUNNELLED CATHETER EXCHANGE N/A 10/07/2023   Procedure: TUNNELLED CATHETER EXCHANGE;  Surgeon: Tobie Gordy POUR, MD;  Location: [facility] INVASIVE CV LAB;  Service: Cardiovascular;  Laterality: N/A;   UTERINE FIBROID SURGERY  2001    Short Social History:  Social History   Tobacco Use   Smoking status: Former    Current packs/day: 0.00    Average packs/day: 0.8 packs/day for 20.0 years (15.0 ttl pk-yrs)    Types: Cigarettes    Start date: 06/08/1991    Quit date: 06/08/2011    Years since quitting: 12.4    Passive exposure: Past   Smokeless tobacco: Never  Substance Use Topics   Alcohol use: No    Allergies  Allergen Reactions   Ciprofloxacin  Swelling and Other (See Comments)    Per pt  caused lips swell and nauseous feeling   Levaquin [Levofloxacin] Swelling and Other (See Comments)    Per pt caused lips swell and nauseous feeling   Linaclotide Other (See Comments)    Cause severe dehydration   Promethazine  Other (See Comments)    Completely wipes out/fatigue   Buspar [Buspirone] Other (See Comments)    abd cramping   Advair Diskus [Fluticasone -Salmeterol] Other (See Comments)    Thrush    Biaxin [Clarithromycin] Rash   Hydroxyzine Palpitations    No current facility-administered medications for this encounter.    REVIEW OF SYSTEMS All other systems were reviewed and are negative     Objective:   Objective   Vitals:   11/30/23 0757  Pulse: (P) 66  Resp: (P) 12  Temp: (P) 98.1 F (36.7 C)  TempSrc: (P) Oral   There is no height or weight on file to calculate BMI.  Physical Exam General: no acute distress Cardiac: hemodynamically stable Extremities: Palpable thrill in left arm for stage brachiobasilic fistula     Assessment/Plan:   Jasmine [name] is a 53 y.o. female with ESRD presenting for tunneled dialysis catheter insertion.  Reviewed risks and benefits of TDC insertion and patient agreed to proceed.   Norman Serve, MD [facility]

## 2023-12-01 ENCOUNTER — Encounter (HOSPITAL_COMMUNITY): Payer: Self-pay | Admitting: Vascular Surgery

## 2023-12-02 ENCOUNTER — Encounter (HOSPITAL_COMMUNITY): Payer: Self-pay

## 2023-12-02 ENCOUNTER — Emergency Department (HOSPITAL_COMMUNITY)
Admission: EM | Admit: 2023-12-02 | Discharge: 2023-12-02 | Disposition: A | Discharge status: Home / Self Care | Attending: Emergency Medicine | Admitting: Emergency Medicine

## 2023-12-02 ENCOUNTER — Emergency Department (HOSPITAL_COMMUNITY)

## 2023-12-02 ENCOUNTER — Other Ambulatory Visit: Payer: Self-pay

## 2023-12-02 DIAGNOSIS — Z79899 Other long term (current) drug therapy: Secondary | ICD-10-CM | POA: Diagnosis not present

## 2023-12-02 DIAGNOSIS — Z794 Long term (current) use of insulin: Secondary | ICD-10-CM | POA: Diagnosis not present

## 2023-12-02 DIAGNOSIS — E119 Type 2 diabetes mellitus without complications: Secondary | ICD-10-CM | POA: Insufficient documentation

## 2023-12-02 DIAGNOSIS — R0789 Other chest pain: Secondary | ICD-10-CM | POA: Diagnosis not present

## 2023-12-02 DIAGNOSIS — R079 Chest pain, unspecified: Secondary | ICD-10-CM

## 2023-12-02 DIAGNOSIS — Z7982 Long term (current) use of aspirin: Secondary | ICD-10-CM | POA: Insufficient documentation

## 2023-12-02 DIAGNOSIS — I10 Essential (primary) hypertension: Secondary | ICD-10-CM | POA: Diagnosis not present

## 2023-12-02 LAB — CBC
HCT: 27.4 % — ABNORMAL LOW (ref 36.0–46.0)
Hemoglobin: 8.4 g/dL — ABNORMAL LOW (ref 12.0–15.0)
MCH: 29.6 pg (ref 26.0–34.0)
MCHC: 30.7 g/dL (ref 30.0–36.0)
MCV: 96.5 fL (ref 80.0–100.0)
Platelets: 193 10*3/uL (ref 150–400)
RBC: 2.84 MIL/uL — ABNORMAL LOW (ref 3.87–5.11)
RDW: 12.7 % (ref 11.5–15.5)
WBC: 5.5 10*3/uL (ref 4.0–10.5)
nRBC: 0 % (ref 0.0–0.2)

## 2023-12-02 LAB — BASIC METABOLIC PANEL WITH GFR
Anion gap: 12 (ref 5–15)
BUN: 23 mg/dL — ABNORMAL HIGH (ref 6–20)
CO2: 25 mmol/L (ref 22–32)
Calcium: 9 mg/dL (ref 8.9–10.3)
Chloride: 101 mmol/L (ref 98–111)
Creatinine, Ser: 3.65 mg/dL — ABNORMAL HIGH (ref 0.44–1.00)
GFR, Estimated: 14 mL/min — ABNORMAL LOW (ref >60)
Glucose, Bld: 142 mg/dL — ABNORMAL HIGH (ref 70–99)
Potassium: 3.9 mmol/L (ref 3.5–5.1)
Sodium: 138 mmol/L (ref 135–145)

## 2023-12-02 LAB — TROPONIN I (HIGH SENSITIVITY)
Troponin I (High Sensitivity): 203 ng/L (ref <18)
Troponin I (High Sensitivity): 190 ng/L (ref <18)

## 2023-12-02 NOTE — ED Triage Notes (Signed)
 Per EMS  Chest pain Started today Pt was hanging up decorations for the 4th of July Aching pain

## 2023-12-02 NOTE — ED Provider Notes (Signed)
 [place] EMERGENCY DEPARTMENT AT [facility] Provider Note   CSN: [id number] Arrival date & time: 12/02/23  1727     Patient presents with: Chest Pain   Jasmine [name] is a 53 y.o. female.   Patient complains of chest pain.  Patient reports the pain started today when she was hanging up Fourth of July decorations.  Patient states she has a history of chest pain.  Patient states she has multiple medical problems.  She has a past medical history of hypertension diabetes patient has renal failure and she is currently on dialysis.  Patient had a new dialysis catheter placed 2 days ago.  Patient reports that she had 2 hours of dialysis.  Patient denies any current shortness of breath.  She reports that they did not do her full dialysis as she was below her normal dry rate.  Patient reports some shortness of breath.  The history is provided by the patient. No language interpreter was used.  Chest Pain      Prior to Admission medications   Medication Sig Start Date End Date Taking? Authorizing Provider  acetaminophen  (TYLENOL ) 500 MG tablet Take 500-1,000 mg by mouth every 6 (six) hours as needed (pain.).    [provider]  aspirin  EC 81 MG tablet Take 1 tablet (81 mg total) by mouth daily with breakfast. Swallow whole. 09/09/23   Pearlean Manus, MD  azelastine  (ASTELIN ) 0.1 % nasal spray Place 2 sprays into both nostrils 2 (two) times daily. Use in each nostril as directed Patient taking differently: Place 2 sprays into both nostrils daily as needed for allergies or rhinitis. 08/01/23   Hope Almarie ORN, NP  Blood Glucose Monitoring Suppl DEVI 1 each by Does not apply route 3 (three) times daily. May dispense any manufacturer covered by patient's insurance. 11/03/23   Maree, Pratik D, DO  buPROPion  (WELLBUTRIN  XL) 150 MG 24 hr tablet Take 1 tablet (150 mg total) by mouth daily. 10/28/23 02/25/24  Vickey Mettle, MD  carvedilol  (COREG ) 12.5 MG tablet Take 12.5 mg by mouth 2  (two) times daily with a meal. 01/17/23 01/17/24  [provider]  Cholecalciferol  (VITAMIN D -3 PO) Take 1 tablet by mouth in the morning.    [provider]  clopidogrel  (PLAVIX ) 75 MG tablet Take 1 tablet (75 mg total) by mouth daily. 02/22/23   Vannie Reche RAMAN, NP  Continuous Blood Gluc Sensor MISC 1 each by Does not apply route as directed. Use as directed every 14 days. May dispense FreeStyle [employer] or similar.    [provider]  diclofenac  Sodium (VOLTAREN ) 1 % GEL Apply 2 g topically 4 (four) times daily as needed (pain.).    [provider]  DULoxetine  (CYMBALTA ) 30 MG capsule Take 1 capsule (30 mg total) by mouth daily. 10/11/23 02/08/24  Vickey Mettle, MD  folic acid  (FOLVITE ) 800 MCG tablet Take 800 mcg by mouth in the morning.    [provider]  glucagon (GLUCAGEN  HYPOKIT) 1 MG SOLR injection Inject 1 mg into the skin once as needed for up to 1 dose for low blood sugar. GlucaGen  HypoKit 1 mg Injection 03/31/23   Johnson, Clanford L, MD  Glucose Blood (BLOOD GLUCOSE TEST STRIPS) STRP 1 each by Does not apply route 3 (three) times daily. Use as directed to check blood sugar. May dispense any manufacturer covered by patient's insurance and fits patient's device. 11/03/23   Maree, Pratik D, DO  hydrALAZINE  (APRESOLINE ) 25 MG tablet Take  3 tablets (75 mg total) by mouth 3 (three) times daily. 11/03/23   Maree, Pratik D, DO  HYDROcodone -acetaminophen  (NORCO/VICODIN) 5-325 MG tablet Take 1 tablet by mouth every 6 (six) hours as needed for moderate pain (pain score 4-6). 10/14/23   Bethanie Cough, PA-C  insulin  aspart (NOVOLOG ) 100 UNIT/ML FlexPen If eating and Blood Glucose (BG) 80 or higher inject 0 units for meal coverage and add correction dose per scale. If not eating, correction dose only. BG <150= 0 unit; BG 150-200= 1 unit; BG 201-250= 2 unit; BG 251-300= 3 unit; BG 301-350= 4 unit; BG 351-400= 5 unit; BG >400= 6 unit and Call Primary Care.  (When not using pump) 11/03/23   Maree, Pratik D, DO  insulin  degludec (TRESIBA ) 100 UNIT/ML FlexTouch Pen Inject 50 Units into the skin daily. May substitute as needed per insurance. When not using pump. 11/03/23   Maree, Pratik D, DO  Insulin  Pen Needle (PEN NEEDLES) 31G X 5 MM MISC 1 each by Does not apply route 3 (three) times daily. May dispense any manufacturer covered by patient's insurance. 11/03/23   Maree, Pratik D, DO  isosorbide  mononitrate (IMDUR ) 30 MG 24 hr tablet Take 1 tablet (30 mg total) by mouth daily. 11/04/23   Maree, Pratik D, DO  lamoTRIgine  (LAMICTAL ) 200 MG tablet Take 1 tablet (200 mg total) by mouth every evening. 10/21/23 04/18/24  Vickey Mettle, MD  Lancet Device MISC 1 each by Does not apply route 3 (three) times daily. May dispense any manufacturer covered by patient's insurance. 11/03/23   Maree Bracken D, DO  Lancets MISC 1 each by Does not apply route 3 (three) times daily. Use as directed to check blood sugar. May dispense any manufacturer covered by patient's insurance and fits patient's device. 11/03/23   Maree, Pratik D, DO  levothyroxine  (SYNTHROID ) 175 MCG tablet Take 1.5 tablets (mcg) by mouth on Sundays in the mornings & take 1 tablet (175 mcg) by mouth on all other days. 11/20/21   [provider]  LORazepam  (ATIVAN ) 0.5 MG tablet Take 1 tablet (0.5 mg total) by mouth daily as needed for anxiety. 11/11/23 12/11/23  Vickey Mettle, MD  Melatonin 10 MG TABS Take 10 mg by mouth at bedtime.    [provider]  mupirocin  ointment (BACTROBAN ) 2 % Apply 1 Application topically 2 (two) times daily. 11/07/23   Melvenia Corean SAILOR, NP  NOVOLOG  100 UNIT/ML injection Inject into the skin continuous. Sliding Scale Insulin  pump 2.5 units basal Bolus with meal depending on the size 03/15/22   [provider]  ondansetron  (ZOFRAN -ODT) 4 MG disintegrating tablet Take 4 mg by mouth every 8 (eight) hours as needed for nausea or vomiting. 03/17/22   [provider]   OXYGEN Inhale 3 L into the lungs at bedtime.    [provider]  PRESCRIPTION MEDICATION See admin instructions.  IDPN LV CUSTOM (100GM)   INFUSE 1 BAG DURING HD 3 TIMES WEEKLY AS FOLLOWS 1ST WEEK 55ML/HR; 2ND WEEK 110ML/HR; 3RD WEEK 163ML/HR; THEN CONTINUE WITH GOAL RATE= 163ML/HR OVER 3HRS . TITRATE AS TOLERATED. ADMINISTER IV VIA VENOUS DRIP CHAMBER.    [provider]  rizatriptan  (MAXALT ) 10 MG tablet Take 10 mg by mouth as needed for migraine. May repeat in 2 hours if needed    [provider]  rosuvastatin  (CRESTOR ) 10 MG tablet Take 1 tablet (10 mg total) by mouth daily. Patient taking differently: Take 10 mg by mouth every evening. 07/29/23 11/21/23  Lonni Slain,  MD  sevelamer  carbonate (RENVELA ) 800 MG tablet Take 1,600 mg by mouth 3 (three) times daily with meals. 07/28/23   [provider]  torsemide  (DEMADEX ) 20 MG tablet Take 40 mg by mouth every Monday, Wednesday, and Friday.    [provider]    Allergies: Ciprofloxacin , Levaquin [levofloxacin], Linaclotide, Promethazine , Buspar [buspirone], Advair diskus [fluticasone -salmeterol], Biaxin [clarithromycin], and Hydroxyzine    Review of Systems  Cardiovascular:  Positive for chest pain.  All other systems reviewed and are negative.   Updated Vital Signs BP (!) 142/54   Pulse 71   Temp 98.2 F (36.8 C)   Resp 18   LMP 03/23/2017 (Approximate)   SpO2 98%   Physical Exam Vitals and nursing note reviewed.  Constitutional:      Appearance: She is well-developed.  HENT:     Head: Normocephalic.   Cardiovascular:     Rate and Rhythm: Normal rate.     Heart sounds: Normal heart sounds.  Pulmonary:     Effort: Pulmonary effort is normal.  Abdominal:     General: Bowel sounds are normal. There is no distension.     Palpations: Abdomen is soft.   Musculoskeletal:        General: Normal range of motion.     Cervical back: Normal range of motion.    Skin:    General: Skin is warm.   Neurological:     General: No focal deficit present.     Mental Status: She is alert and oriented to person, place, and time.     (all labs ordered are listed, but only abnormal results are displayed) Labs Reviewed  BASIC METABOLIC PANEL WITH GFR - Abnormal; Notable for the following components:      Result Value   Glucose, Bld 142 (*)    BUN 23 (*)    Creatinine, Ser 3.65 (*)    GFR, Estimated 14 (*)    All other components within normal limits  CBC - Abnormal; Notable for the following components:   RBC 2.84 (*)    Hemoglobin 8.4 (*)    HCT 27.4 (*)    All other components within normal limits  TROPONIN I (HIGH SENSITIVITY) - Abnormal; Notable for the following components:   Troponin I (High Sensitivity) 203 (*)    All other components within normal limits  TROPONIN I (HIGH SENSITIVITY) - Abnormal; Notable for the following components:   Troponin I (High Sensitivity) 190 (*)    All other components within normal limits    EKG: EKG Interpretation Date/Time:  Friday December 02 2023 20:16:48 EDT Ventricular Rate:  71 PR Interval:  165 QRS Duration:  107 QT Interval:  442 QTC Calculation: 481 R Axis:   14  Text Interpretation: Sinus rhythm Probable anteroseptal infarct, old no significant change since Mar 2025 Confirmed by Freddi Hamilton [phone]) on 12/02/2023 8:18:11 PM  Radiology: DG Chest 2 View Result Date: 12/02/2023 CLINICAL DATA:  chest pain EXAM: CHEST - 2 VIEW COMPARISON:  Oct 30, 2023 FINDINGS: Left chest tunneled hemodialysis catheter terminating in the right atrium. Bilateral perihilar interstitial opacities. Blunting of the left costophrenic sulcus. No pneumothorax. No cardiomegaly.No acute fracture or destructive lesion. IMPRESSION: Findings suggestive of interstitial edema with a small left pleural effusion. Electronically Signed   By: Rogelia Myers M.D.   On: 12/02/2023 18:49     Procedures   Medications Ordered in  the ED - No data to display  Medical Decision Making Patient complains of chest pain with exertion today.  Amount and/or Complexity of Data Reviewed External Data Reviewed: notes.    Details: Vascular notes reviewed.  Cardiology notes reviewed Labs: ordered. Decision-making details documented in ED Course.    Details: Labs ordered reviewed and interpreted.  Troponin is 203 initially repeat is 190 hemoglobin is 8.4 potassium is normal. Radiology: ordered and independent interpretation performed.    Details: Chest x-ray shows interstitial edema left pleural effusion ECG/medicine tests: ordered and independent interpretation performed. Decision-making details documented in ED Course.  Risk Risk Details: Patient's troponins chronically elevated.  Patient is scheduled for dialysis in the morning.  Patient's vital signs are stable.  Patient advised to keep dialysis appointment return if any problems.        Final diagnoses:  Nonspecific chest pain    ED Discharge Orders     None       An After Visit Summary was printed and given to the patient.    Flint Sonny POUR, [place] 12/02/23 2243    Freddi Hamilton, MD 12/04/23 1041

## 2023-12-02 NOTE — Discharge Instructions (Addendum)
 Follow up with Cardiology.  Keep your dialysis appointment tomorrow.

## 2023-12-05 ENCOUNTER — Ambulatory Visit: Attending: Surgery | Admitting: Physician Assistant

## 2023-12-05 ENCOUNTER — Ambulatory Visit (HOSPITAL_COMMUNITY)
Admission: RE | Admit: 2023-12-05 | Discharge: 2023-12-05 | Disposition: A | Discharge status: Home / Self Care | Source: Ambulatory Visit | Attending: Surgery | Admitting: Surgery

## 2023-12-05 ENCOUNTER — Other Ambulatory Visit: Payer: Self-pay

## 2023-12-05 VITALS — BP 145/74 | HR 64 | Temp 98.1°F | Ht 69.0 in | Wt 267.1 lb

## 2023-12-05 DIAGNOSIS — N186 End stage renal disease: Secondary | ICD-10-CM

## 2023-12-05 DIAGNOSIS — Z992 Dependence on renal dialysis: Secondary | ICD-10-CM | POA: Diagnosis not present

## 2023-12-05 NOTE — H&P (View-Only) (Signed)
 Office Note     CC:  follow up Requesting Provider:  Debrah Josette ORN., PA-C  HPI: Jasmine [name] is a 53 y.o. (1971/02/18) female who presents status post left arm basilic vein fistula creation by Dr. Serene on 10/14/2023.  She is dialyzing via left IJ [facility] on a Tuesday Thursday Saturday schedule in [place].  She denies steal symptoms in her left hand.  She believes the incision has healed in her left arm.  She is on Plavix  due to coronary artery stenting in March of this year.  She denies chest pain or shortness of breath.   Past Medical History:  Diagnosis Date   Anemia    Anxiety    Arthritis    Asthma    Balance problems    Bipolar disorder (HCC)    Charcot ankle    Chronic fatigue    Coronary artery disease    Depression    DKA, type 1 (HCC) 11/04/2011   Elevated cholesterol    ESRD on hemodialysis (HCC)    [facility]S at [facility]   Fibromyalgia    GERD (gastroesophageal reflux disease)    Headache    History of suicidal ideation    Hyperlipemia    Hypertension    Hypothyroidism    IBS (irritable bowel syndrome)    Memory changes    Obesity    Sleep apnea    wears cpap   Stress incontinence    Pt had surgery to correct this.   Tobacco abuse    Tremor     Past Surgical History:  Procedure Laterality Date   AV FISTULA PLACEMENT Left 10/14/2023   Procedure: BRACHIOBASILIC ARTERIOVENOUS (AV) FISTULA CREATION LEFT;  Surgeon: Serene Gaile ORN, MD;  Location: [facility] OR;  Service: Vascular;  Laterality: Left;   CORONARY IMAGING/OCT N/A 08/30/2023   Procedure: CORONARY IMAGING/OCT;  Surgeon: Wendel Lurena POUR, MD;  Location: [facility] INVASIVE CV LAB;  Service: Cardiovascular;  Laterality: N/A;   CORONARY STENT INTERVENTION N/A 08/30/2023   Procedure: CORONARY STENT INTERVENTION;  Surgeon: Wendel Lurena POUR, MD;  Location: [facility] INVASIVE CV LAB;  Service: Cardiovascular;  Laterality: N/A;   DIALYSIS/PERMA CATHETER INSERTION N/A 11/30/2023   Procedure: DIALYSIS/PERMA CATHETER INSERTION;   Surgeon: Pearline Norman RAMAN, MD;  Location: [facility] PV LAB;  Service: Cardiovascular;  Laterality: N/A;   INCONTINENCE SURGERY     IR FLUORO GUIDE CV LINE LEFT  09/10/2023   IR US  GUIDE VASC ACCESS LEFT  09/10/2023   LEFT HEART CATH AND CORONARY ANGIOGRAPHY N/A 07/15/2023   Procedure: LEFT HEART CATH AND CORONARY ANGIOGRAPHY;  Surgeon: Wendel Lurena POUR, MD;  Location: [facility] INVASIVE CV LAB;  Service: Cardiovascular;  Laterality: N/A;   LEFT HEART CATH AND CORONARY ANGIOGRAPHY N/A 08/30/2023   Procedure: LEFT HEART CATH AND CORONARY ANGIOGRAPHY;  Surgeon: Wendel Lurena POUR, MD;  Location: [facility] INVASIVE CV LAB;  Service: Cardiovascular;  Laterality: N/A;   NASAL FRACTURE SURGERY     ovary removed     OVARY SURGERY     PUBOVAGINAL SLING  08/16/2011   Procedure: CARLOYN GLADE;  Surgeon: Alm RAMAN Fragmin, MD;  Location: [facility] ORS;  Service: Urology;  Laterality: N/A;          TEE WITHOUT CARDIOVERSION N/A 09/08/2023   Procedure: ECHOCARDIOGRAM, TRANSESOPHAGEAL;  Surgeon: Alvan Dorn FALCON, MD;  Location: [facility] ORS;  Service: Endoscopy;  Laterality: N/A;   TUNNELLED CATHETER EXCHANGE N/A 09/30/2023   Procedure: TUNNELLED CATHETER EXCHANGE;  Surgeon: Melia Lynwood ORN, MD;  Location: [facility]  INVASIVE CV LAB;  Service: Cardiovascular;  Laterality: N/A;   TUNNELLED CATHETER EXCHANGE N/A 10/07/2023   Procedure: TUNNELLED CATHETER EXCHANGE;  Surgeon: Tobie Gordy POUR, MD;  Location: [facility] INVASIVE CV LAB;  Service: Cardiovascular;  Laterality: N/A;   UTERINE FIBROID SURGERY  2001    Social History   Socioeconomic History   Marital status: Married    Spouse name: Not on file   Number of children: 0   Years of education: 12   Highest education level: Not on file  Occupational History   Occupation: Disabled  Tobacco Use   Smoking status: Former    Current packs/day: 0.00    Average packs/day: 0.8 packs/day for 20.0 years (15.0 ttl pk-yrs)    Types: Cigarettes    Start date: 06/08/1991    Quit date: 06/08/2011    Years  since quitting: 12.5    Passive exposure: Past   Smokeless tobacco: Never  Vaping Use   Vaping status: Never Used  Substance and Sexual Activity   Alcohol use: No   Drug use: No   Sexual activity: Yes    Birth control/protection: Post-menopausal  Other Topics Concern   Not on file  Social History Narrative   Lives at home with husband.   Right-handed.   Occasional caffeine use.   Social Drivers of Corporate investment banker Strain: Low Risk  (12/22/2022)   Received from [facility]   Overall Financial Resource Strain (CARDIA)    Difficulty of Paying Living Expenses: Not hard at all  Food Insecurity: Low Risk  (11/29/2023)   Received from [facility]   Hunger Vital Sign    Within the past 12 months, you worried that your food would run out before you got money to buy more: Never true    Within the past 12 months, the food you bought just didn't last and you didn't have money to get more. : Never true  Transportation Needs: No Transportation Needs (11/29/2023)   Received from [facility]    In the past 12 months, has lack of reliable transportation kept you from medical appointments, meetings, work or from getting things needed for daily living? : No  Physical Activity: Inactive (01/23/2019)   Exercise Vital Sign    Days of Exercise per Week: 0 days    Minutes of Exercise per Session: 0 min  Stress: Stress Concern Present (01/23/2019)   [employer] of Occupational Health - Occupational Stress Questionnaire    Feeling of Stress : Very much  Social Connections: Unknown (10/19/2021)   Received from [facility]   Social Network    Social Network: Not on file  Intimate Partner Violence: Not At Risk (10/30/2023)   Humiliation, Afraid, Rape, and Kick questionnaire    Fear of Current or Ex-Partner: No    Emotionally Abused: No    Physically Abused: No    Sexually Abused: No    Family History  Problem Relation Age of Onset   Asthma Mother     Bipolar disorder Mother    Heart disease Father    Lymphoma Father    Hypertension Father    Thyroid  disease Father    Hyperlipidemia Father    Diabetes Father    Cancer Paternal Grandmother        lung and breast   Bladder Cancer Paternal Grandfather    Suicidality Maternal Grandfather    Thyroid  disease Brother     Current Outpatient Medications  Medication Sig Dispense Refill  acetaminophen  (TYLENOL ) 500 MG tablet Take 500-1,000 mg by mouth every 6 (six) hours as needed (pain.).     aspirin  EC 81 MG tablet Take 1 tablet (81 mg total) by mouth daily with breakfast. Swallow whole. 30 tablet 12   Blood Glucose Monitoring Suppl DEVI 1 each by Does not apply route 3 (three) times daily. May dispense any manufacturer covered by patient's insurance. 1 each 0   buPROPion  (WELLBUTRIN  XL) 150 MG 24 hr tablet Take 1 tablet (150 mg total) by mouth daily. 30 tablet 3   carvedilol  (COREG ) 12.5 MG tablet Take 12.5 mg by mouth 2 (two) times daily with a meal.     Cholecalciferol  (VITAMIN D -3 PO) Take 1 tablet by mouth in the morning.     clopidogrel  (PLAVIX ) 75 MG tablet Take 1 tablet (75 mg total) by mouth daily. 30 tablet 5   Continuous Blood Gluc Sensor MISC 1 each by Does not apply route as directed. Use as directed every 14 days. May dispense FreeStyle [employer] or similar.     diclofenac  Sodium (VOLTAREN ) 1 % GEL Apply 2 g topically 4 (four) times daily as needed (pain.).     DULoxetine  (CYMBALTA ) 30 MG capsule Take 1 capsule (30 mg total) by mouth daily. 30 capsule 3   folic acid  (FOLVITE ) 800 MCG tablet Take 800 mcg by mouth in the morning.     glucagon (GLUCAGEN  HYPOKIT) 1 MG SOLR injection Inject 1 mg into the skin once as needed for up to 1 dose for low blood sugar. GlucaGen  HypoKit 1 mg Injection 1 each 3   Glucose Blood (BLOOD GLUCOSE TEST STRIPS) STRP 1 each by Does not apply route 3 (three) times daily. Use as directed to check blood sugar. May dispense any manufacturer  covered by patient's insurance and fits patient's device. 100 strip 0   hydrALAZINE  (APRESOLINE ) 25 MG tablet Take 3 tablets (75 mg total) by mouth 3 (three) times daily. 270 tablet 1   HYDROcodone -acetaminophen  (NORCO/VICODIN) 5-325 MG tablet Take 1 tablet by mouth every 6 (six) hours as needed for moderate pain (pain score 4-6). 10 tablet 0   insulin  aspart (NOVOLOG ) 100 UNIT/ML FlexPen If eating and Blood Glucose (BG) 80 or higher inject 0 units for meal coverage and add correction dose per scale. If not eating, correction dose only. BG <150= 0 unit; BG 150-200= 1 unit; BG 201-250= 2 unit; BG 251-300= 3 unit; BG 301-350= 4 unit; BG 351-400= 5 unit; BG >400= 6 unit and Call Primary Care. (When not using pump) 15 mL 0   insulin  degludec (TRESIBA ) 100 UNIT/ML FlexTouch Pen Inject 50 Units into the skin daily. May substitute as needed per insurance. When not using pump. 15 mL 0   Insulin  Pen Needle (PEN NEEDLES) 31G X 5 MM MISC 1 each by Does not apply route 3 (three) times daily. May dispense any manufacturer covered by patient's insurance. 100 each 0   isosorbide  mononitrate (IMDUR ) 30 MG 24 hr tablet Take 1 tablet (30 mg total) by mouth daily. 30 tablet 1   lamoTRIgine  (LAMICTAL ) 200 MG tablet Take 1 tablet (200 mg total) by mouth every evening. 30 tablet 5   Lancet Device MISC 1 each by Does not apply route 3 (three) times daily. May dispense any manufacturer covered by patient's insurance. 1 each 0   Lancets MISC 1 each by Does not apply route 3 (three) times daily. Use as directed to check blood sugar. May dispense any manufacturer covered by  patient's insurance and fits patient's device. 100 each 0   levothyroxine  (SYNTHROID ) 175 MCG tablet Take 1.5 tablets (mcg) by mouth on Sundays in the mornings & take 1 tablet (175 mcg) by mouth on all other days.     LORazepam  (ATIVAN ) 0.5 MG tablet Take 1 tablet (0.5 mg total) by mouth daily as needed for anxiety. 30 tablet 0   Melatonin 10 MG TABS Take 10 mg  by mouth at bedtime.     mupirocin  ointment (BACTROBAN ) 2 % Apply 1 Application topically 2 (two) times daily. 30 g 1   NOVOLOG  100 UNIT/ML injection Inject into the skin continuous. Sliding Scale Insulin  pump 2.5 units basal Bolus with meal depending on the size     ondansetron  (ZOFRAN -ODT) 4 MG disintegrating tablet Take 4 mg by mouth every 8 (eight) hours as needed for nausea or vomiting.     OXYGEN Inhale 3 L into the lungs at bedtime.     PRESCRIPTION MEDICATION See admin instructions.  IDPN LV CUSTOM (100GM)   INFUSE 1 BAG DURING HD 3 TIMES WEEKLY AS FOLLOWS 1ST WEEK 55ML/HR; 2ND WEEK 110ML/HR; 3RD WEEK 163ML/HR; THEN CONTINUE WITH GOAL RATE= 163ML/HR OVER 3HRS . TITRATE AS TOLERATED. ADMINISTER IV VIA VENOUS DRIP CHAMBER.     rizatriptan  (MAXALT ) 10 MG tablet Take 10 mg by mouth as needed for migraine. May repeat in 2 hours if needed     rosuvastatin  (CRESTOR ) 10 MG tablet Take 1 tablet (10 mg total) by mouth daily. (Patient taking differently: Take 10 mg by mouth every evening.) 90 tablet 3   sevelamer  carbonate (RENVELA ) 800 MG tablet Take 1,600 mg by mouth 3 (three) times daily with meals.     torsemide  (DEMADEX ) 20 MG tablet Take 40 mg by mouth every Monday, Wednesday, and Friday.     azelastine  (ASTELIN ) 0.1 % nasal spray Place 2 sprays into both nostrils 2 (two) times daily. Use in each nostril as directed (Patient taking differently: Place 2 sprays into both nostrils daily as needed for allergies or rhinitis.) 30 mL 2   No current facility-administered medications for this visit.    Allergies  Allergen Reactions   Ciprofloxacin  Swelling and Other (See Comments)    Per pt caused lips swell and nauseous feeling   Levaquin [Levofloxacin] Swelling and Other (See Comments)    Per pt caused lips swell and nauseous feeling   Linaclotide Other (See Comments)    Cause severe dehydration   Promethazine  Other (See Comments)    Completely wipes out/fatigue   Buspar  [Buspirone] Other (See Comments)    abd cramping   Advair Diskus [Fluticasone -Salmeterol] Other (See Comments)    Thrush    Biaxin [Clarithromycin] Rash   Hydroxyzine Palpitations     REVIEW OF SYSTEMS:   [X]  denotes positive finding, [ ]  denotes negative finding Cardiac  Comments:  Chest pain or chest pressure:    Shortness of breath upon exertion:    Short of breath when lying flat:    Irregular heart rhythm:        Vascular    Pain in calf, thigh, or hip brought on by ambulation:    Pain in feet at night that wakes you up from your sleep:     Blood clot in your veins:    Leg swelling:         Pulmonary    Oxygen at home:    Productive cough:     Wheezing:  Neurologic    Sudden weakness in arms or legs:     Sudden numbness in arms or legs:     Sudden onset of difficulty speaking or slurred speech:    Temporary loss of vision in one eye:     Problems with dizziness:         Gastrointestinal    Blood in stool:     Vomited blood:         Genitourinary    Burning when urinating:     Blood in urine:        Psychiatric    Major depression:         Hematologic    Bleeding problems:    Problems with blood clotting too easily:        Skin    Rashes or ulcers:        Constitutional    Fever or chills:      PHYSICAL EXAMINATION:  Vitals:   12/05/23 1341  BP: (!) 145/74  Pulse: 64  Temp: 98.1 F (36.7 C)  TempSrc: Temporal  SpO2: 91%  Weight: 267 lb 1.6 oz (121.2 kg)  Height: 5' 9 (1.753 m)    General:  WDWN in NAD; vital signs documented above Gait: Not observed HENT: WNL, normocephalic Pulmonary: normal non-labored breathing Cardiac: regular HR Abdomen: soft, NT, no masses Skin: without rashes Vascular Exam/Pulses: Palpable left radial pulse; palpable thrill near the anastomosis of the fistula Extremities: Left arm incision well-healed Musculoskeletal: no muscle wasting or atrophy  Neurologic: A&O X 3 Psychiatric:  The pt has Normal  affect.   Non-Invasive Vascular Imaging:   Patent basilic vein fistula on the left arm Greater than 6 mm in depth and greater than 6 mm in diameter    ASSESSMENT/PLAN:: 53 y.o. female status post left arm basilic vein fistula creation  Patent left arm basilic vein fistula.  No signs or symptoms of steal syndrome in the left hand.  Left arm incision well-healed.  Duplex demonstrates a widely patent fistula that has matured since surgery.  We will proceed with left arm second stage basilic vein transposition with Dr. Serene on a nondialysis day in the near future.  The patient and her husband would prefer surgery on a Friday due to his work schedule.  Past medical history also significant for CAD with recent coronary stenting in March of this year.  She will need to continue Plavix  perioperatively.  We discussed today that she may require drain placement but this will be determined intraoperatively.  Second stage basilic vein transposition was discussed in detail with the patient and her husband and they are agreeable to proceed.   Donnice Sender, PA-C [facility] [phone]  Clinic MD:   Serene

## 2023-12-05 NOTE — Progress Notes (Signed)
 Office Note     CC:  follow up Requesting Provider:  Debrah Josette ORN., PA-C  HPI: Jasmine [name] is a 53 y.o. (1971/02/18) female who presents status post left arm basilic vein fistula creation by Dr. Serene on 10/14/2023.  She is dialyzing via left IJ [facility] on a Tuesday Thursday Saturday schedule in [place].  She denies steal symptoms in her left hand.  She believes the incision has healed in her left arm.  She is on Plavix  due to coronary artery stenting in March of this year.  She denies chest pain or shortness of breath.   Past Medical History:  Diagnosis Date   Anemia    Anxiety    Arthritis    Asthma    Balance problems    Bipolar disorder (HCC)    Charcot ankle    Chronic fatigue    Coronary artery disease    Depression    DKA, type 1 (HCC) 11/04/2011   Elevated cholesterol    ESRD on hemodialysis (HCC)    TTS at [facility]   Fibromyalgia    GERD (gastroesophageal reflux disease)    Headache    History of suicidal ideation    Hyperlipemia    Hypertension    Hypothyroidism    IBS (irritable bowel syndrome)    Memory changes    Obesity    Sleep apnea    wears cpap   Stress incontinence    Pt had surgery to correct this.   Tobacco abuse    Tremor     Past Surgical History:  Procedure Laterality Date   AV FISTULA PLACEMENT Left 10/14/2023   Procedure: BRACHIOBASILIC ARTERIOVENOUS (AV) FISTULA CREATION LEFT;  Surgeon: Serene Gaile ORN, MD;  Location: [facility] OR;  Service: Vascular;  Laterality: Left;   CORONARY IMAGING/OCT N/A 08/30/2023   Procedure: CORONARY IMAGING/OCT;  Surgeon: Wendel Lurena POUR, MD;  Location: [facility] INVASIVE CV LAB;  Service: Cardiovascular;  Laterality: N/A;   CORONARY STENT INTERVENTION N/A 08/30/2023   Procedure: CORONARY STENT INTERVENTION;  Surgeon: Wendel Lurena POUR, MD;  Location: [facility] INVASIVE CV LAB;  Service: Cardiovascular;  Laterality: N/A;   DIALYSIS/PERMA CATHETER INSERTION N/A 11/30/2023   Procedure: DIALYSIS/PERMA CATHETER INSERTION;   Surgeon: Pearline Norman RAMAN, MD;  Location: [facility] PV LAB;  Service: Cardiovascular;  Laterality: N/A;   INCONTINENCE SURGERY     IR FLUORO GUIDE CV LINE LEFT  09/10/2023   IR US  GUIDE VASC ACCESS LEFT  09/10/2023   LEFT HEART CATH AND CORONARY ANGIOGRAPHY N/A 07/15/2023   Procedure: LEFT HEART CATH AND CORONARY ANGIOGRAPHY;  Surgeon: Wendel Lurena POUR, MD;  Location: [facility] INVASIVE CV LAB;  Service: Cardiovascular;  Laterality: N/A;   LEFT HEART CATH AND CORONARY ANGIOGRAPHY N/A 08/30/2023   Procedure: LEFT HEART CATH AND CORONARY ANGIOGRAPHY;  Surgeon: Wendel Lurena POUR, MD;  Location: [facility] INVASIVE CV LAB;  Service: Cardiovascular;  Laterality: N/A;   NASAL FRACTURE SURGERY     ovary removed     OVARY SURGERY     PUBOVAGINAL SLING  08/16/2011   Procedure: CARLOYN GLADE;  Surgeon: Alm RAMAN Fragmin, MD;  Location: [facility] ORS;  Service: Urology;  Laterality: N/A;          TEE WITHOUT CARDIOVERSION N/A 09/08/2023   Procedure: ECHOCARDIOGRAM, TRANSESOPHAGEAL;  Surgeon: Alvan Dorn FALCON, MD;  Location: [facility] ORS;  Service: Endoscopy;  Laterality: N/A;   TUNNELLED CATHETER EXCHANGE N/A 09/30/2023   Procedure: TUNNELLED CATHETER EXCHANGE;  Surgeon: Melia Lynwood ORN, MD;  Location: [facility]  INVASIVE CV LAB;  Service: Cardiovascular;  Laterality: N/A;   TUNNELLED CATHETER EXCHANGE N/A 10/07/2023   Procedure: TUNNELLED CATHETER EXCHANGE;  Surgeon: Tobie Gordy POUR, MD;  Location: [facility] INVASIVE CV LAB;  Service: Cardiovascular;  Laterality: N/A;   UTERINE FIBROID SURGERY  2001    Social History   Socioeconomic History   Marital status: Married    Spouse name: Not on file   Number of children: 0   Years of education: 12   Highest education level: Not on file  Occupational History   Occupation: Disabled  Tobacco Use   Smoking status: Former    Current packs/day: 0.00    Average packs/day: 0.8 packs/day for 20.0 years (15.0 ttl pk-yrs)    Types: Cigarettes    Start date: 06/08/1991    Quit date: 06/08/2011    Years  since quitting: 12.5    Passive exposure: Past   Smokeless tobacco: Never  Vaping Use   Vaping status: Never Used  Substance and Sexual Activity   Alcohol use: No   Drug use: No   Sexual activity: Yes    Birth control/protection: Post-menopausal  Other Topics Concern   Not on file  Social History Narrative   Lives at home with husband.   Right-handed.   Occasional caffeine use.   Social Drivers of Corporate investment banker Strain: Low Risk  (12/22/2022)   Received from [facility]   Overall Financial Resource Strain (CARDIA)    Difficulty of Paying Living Expenses: Not hard at all  Food Insecurity: Low Risk  (11/29/2023)   Received from [facility]   Hunger Vital Sign    Within the past 12 months, you worried that your food would run out before you got money to buy more: Never true    Within the past 12 months, the food you bought just didn't last and you didn't have money to get more. : Never true  Transportation Needs: No Transportation Needs (11/29/2023)   Received from [facility]    In the past 12 months, has lack of reliable transportation kept you from medical appointments, meetings, work or from getting things needed for daily living? : No  Physical Activity: Inactive (01/23/2019)   Exercise Vital Sign    Days of Exercise per Week: 0 days    Minutes of Exercise per Session: 0 min  Stress: Stress Concern Present (01/23/2019)   [employer] of Occupational Health - Occupational Stress Questionnaire    Feeling of Stress : Very much  Social Connections: Unknown (10/19/2021)   Received from [facility]   Social Network    Social Network: Not on file  Intimate Partner Violence: Not At Risk (10/30/2023)   Humiliation, Afraid, Rape, and Kick questionnaire    Fear of Current or Ex-Partner: No    Emotionally Abused: No    Physically Abused: No    Sexually Abused: No    Family History  Problem Relation Age of Onset   Asthma Mother     Bipolar disorder Mother    Heart disease Father    Lymphoma Father    Hypertension Father    Thyroid  disease Father    Hyperlipidemia Father    Diabetes Father    Cancer Paternal Grandmother        lung and breast   Bladder Cancer Paternal Grandfather    Suicidality Maternal Grandfather    Thyroid  disease Brother     Current Outpatient Medications  Medication Sig Dispense Refill  acetaminophen  (TYLENOL ) 500 MG tablet Take 500-1,000 mg by mouth every 6 (six) hours as needed (pain.).     aspirin  EC 81 MG tablet Take 1 tablet (81 mg total) by mouth daily with breakfast. Swallow whole. 30 tablet 12   Blood Glucose Monitoring Suppl DEVI 1 each by Does not apply route 3 (three) times daily. May dispense any manufacturer covered by patient's insurance. 1 each 0   buPROPion  (WELLBUTRIN  XL) 150 MG 24 hr tablet Take 1 tablet (150 mg total) by mouth daily. 30 tablet 3   carvedilol  (COREG ) 12.5 MG tablet Take 12.5 mg by mouth 2 (two) times daily with a meal.     Cholecalciferol  (VITAMIN D -3 PO) Take 1 tablet by mouth in the morning.     clopidogrel  (PLAVIX ) 75 MG tablet Take 1 tablet (75 mg total) by mouth daily. 30 tablet 5   Continuous Blood Gluc Sensor MISC 1 each by Does not apply route as directed. Use as directed every 14 days. May dispense FreeStyle [employer] or similar.     diclofenac  Sodium (VOLTAREN ) 1 % GEL Apply 2 g topically 4 (four) times daily as needed (pain.).     DULoxetine  (CYMBALTA ) 30 MG capsule Take 1 capsule (30 mg total) by mouth daily. 30 capsule 3   folic acid  (FOLVITE ) 800 MCG tablet Take 800 mcg by mouth in the morning.     glucagon (GLUCAGEN  HYPOKIT) 1 MG SOLR injection Inject 1 mg into the skin once as needed for up to 1 dose for low blood sugar. GlucaGen  HypoKit 1 mg Injection 1 each 3   Glucose Blood (BLOOD GLUCOSE TEST STRIPS) STRP 1 each by Does not apply route 3 (three) times daily. Use as directed to check blood sugar. May dispense any manufacturer  covered by patient's insurance and fits patient's device. 100 strip 0   hydrALAZINE  (APRESOLINE ) 25 MG tablet Take 3 tablets (75 mg total) by mouth 3 (three) times daily. 270 tablet 1   HYDROcodone -acetaminophen  (NORCO/VICODIN) 5-325 MG tablet Take 1 tablet by mouth every 6 (six) hours as needed for moderate pain (pain score 4-6). 10 tablet 0   insulin  aspart (NOVOLOG ) 100 UNIT/ML FlexPen If eating and Blood Glucose (BG) 80 or higher inject 0 units for meal coverage and add correction dose per scale. If not eating, correction dose only. BG <150= 0 unit; BG 150-200= 1 unit; BG 201-250= 2 unit; BG 251-300= 3 unit; BG 301-350= 4 unit; BG 351-400= 5 unit; BG >400= 6 unit and Call Primary Care. (When not using pump) 15 mL 0   insulin  degludec (TRESIBA ) 100 UNIT/ML FlexTouch Pen Inject 50 Units into the skin daily. May substitute as needed per insurance. When not using pump. 15 mL 0   Insulin  Pen Needle (PEN NEEDLES) 31G X 5 MM MISC 1 each by Does not apply route 3 (three) times daily. May dispense any manufacturer covered by patient's insurance. 100 each 0   isosorbide  mononitrate (IMDUR ) 30 MG 24 hr tablet Take 1 tablet (30 mg total) by mouth daily. 30 tablet 1   lamoTRIgine  (LAMICTAL ) 200 MG tablet Take 1 tablet (200 mg total) by mouth every evening. 30 tablet 5   Lancet Device MISC 1 each by Does not apply route 3 (three) times daily. May dispense any manufacturer covered by patient's insurance. 1 each 0   Lancets MISC 1 each by Does not apply route 3 (three) times daily. Use as directed to check blood sugar. May dispense any manufacturer covered by  patient's insurance and fits patient's device. 100 each 0   levothyroxine  (SYNTHROID ) 175 MCG tablet Take 1.5 tablets (mcg) by mouth on Sundays in the mornings & take 1 tablet (175 mcg) by mouth on all other days.     LORazepam  (ATIVAN ) 0.5 MG tablet Take 1 tablet (0.5 mg total) by mouth daily as needed for anxiety. 30 tablet 0   Melatonin 10 MG TABS Take 10 mg  by mouth at bedtime.     mupirocin  ointment (BACTROBAN ) 2 % Apply 1 Application topically 2 (two) times daily. 30 g 1   NOVOLOG  100 UNIT/ML injection Inject into the skin continuous. Sliding Scale Insulin  pump 2.5 units basal Bolus with meal depending on the size     ondansetron  (ZOFRAN -ODT) 4 MG disintegrating tablet Take 4 mg by mouth every 8 (eight) hours as needed for nausea or vomiting.     OXYGEN Inhale 3 L into the lungs at bedtime.     PRESCRIPTION MEDICATION See admin instructions.  IDPN LV CUSTOM (100GM)   INFUSE 1 BAG DURING HD 3 TIMES WEEKLY AS FOLLOWS 1ST WEEK 55ML/HR; 2ND WEEK 110ML/HR; 3RD WEEK 163ML/HR; THEN CONTINUE WITH GOAL RATE= 163ML/HR OVER 3HRS . TITRATE AS TOLERATED. ADMINISTER IV VIA VENOUS DRIP CHAMBER.     rizatriptan  (MAXALT ) 10 MG tablet Take 10 mg by mouth as needed for migraine. May repeat in 2 hours if needed     rosuvastatin  (CRESTOR ) 10 MG tablet Take 1 tablet (10 mg total) by mouth daily. (Patient taking differently: Take 10 mg by mouth every evening.) 90 tablet 3   sevelamer  carbonate (RENVELA ) 800 MG tablet Take 1,600 mg by mouth 3 (three) times daily with meals.     torsemide  (DEMADEX ) 20 MG tablet Take 40 mg by mouth every Monday, Wednesday, and Friday.     azelastine  (ASTELIN ) 0.1 % nasal spray Place 2 sprays into both nostrils 2 (two) times daily. Use in each nostril as directed (Patient taking differently: Place 2 sprays into both nostrils daily as needed for allergies or rhinitis.) 30 mL 2   No current facility-administered medications for this visit.    Allergies  Allergen Reactions   Ciprofloxacin  Swelling and Other (See Comments)    Per pt caused lips swell and nauseous feeling   Levaquin [Levofloxacin] Swelling and Other (See Comments)    Per pt caused lips swell and nauseous feeling   Linaclotide Other (See Comments)    Cause severe dehydration   Promethazine  Other (See Comments)    Completely wipes out/fatigue   Buspar  [Buspirone] Other (See Comments)    abd cramping   Advair Diskus [Fluticasone -Salmeterol] Other (See Comments)    Thrush    Biaxin [Clarithromycin] Rash   Hydroxyzine Palpitations     REVIEW OF SYSTEMS:   [X]  denotes positive finding, [ ]  denotes negative finding Cardiac  Comments:  Chest pain or chest pressure:    Shortness of breath upon exertion:    Short of breath when lying flat:    Irregular heart rhythm:        Vascular    Pain in calf, thigh, or hip brought on by ambulation:    Pain in feet at night that wakes you up from your sleep:     Blood clot in your veins:    Leg swelling:         Pulmonary    Oxygen at home:    Productive cough:     Wheezing:  Neurologic    Sudden weakness in arms or legs:     Sudden numbness in arms or legs:     Sudden onset of difficulty speaking or slurred speech:    Temporary loss of vision in one eye:     Problems with dizziness:         Gastrointestinal    Blood in stool:     Vomited blood:         Genitourinary    Burning when urinating:     Blood in urine:        Psychiatric    Major depression:         Hematologic    Bleeding problems:    Problems with blood clotting too easily:        Skin    Rashes or ulcers:        Constitutional    Fever or chills:      PHYSICAL EXAMINATION:  Vitals:   12/05/23 1341  BP: (!) 145/74  Pulse: 64  Temp: 98.1 F (36.7 C)  TempSrc: Temporal  SpO2: 91%  Weight: 267 lb 1.6 oz (121.2 kg)  Height: 5' 9 (1.753 m)    General:  WDWN in NAD; vital signs documented above Gait: Not observed HENT: WNL, normocephalic Pulmonary: normal non-labored breathing Cardiac: regular HR Abdomen: soft, NT, no masses Skin: without rashes Vascular Exam/Pulses: Palpable left radial pulse; palpable thrill near the anastomosis of the fistula Extremities: Left arm incision well-healed Musculoskeletal: no muscle wasting or atrophy  Neurologic: A&O X 3 Psychiatric:  The pt has Normal  affect.   Non-Invasive Vascular Imaging:   Patent basilic vein fistula on the left arm Greater than 6 mm in depth and greater than 6 mm in diameter    ASSESSMENT/PLAN:: 53 y.o. female status post left arm basilic vein fistula creation  Patent left arm basilic vein fistula.  No signs or symptoms of steal syndrome in the left hand.  Left arm incision well-healed.  Duplex demonstrates a widely patent fistula that has matured since surgery.  We will proceed with left arm second stage basilic vein transposition with Dr. Serene on a nondialysis day in the near future.  The patient and her husband would prefer surgery on a Friday due to his work schedule.  Past medical history also significant for CAD with recent coronary stenting in March of this year.  She will need to continue Plavix  perioperatively.  We discussed today that she may require drain placement but this will be determined intraoperatively.  Second stage basilic vein transposition was discussed in detail with the patient and her husband and they are agreeable to proceed.   Jasmine Sender, PA-C [facility] [phone]  Clinic MD:   Serene

## 2023-12-06 LAB — LIPID PANEL
Chol/HDL Ratio: 1.8 ratio (ref 0.0–4.4)
Cholesterol, Total: 129 mg/dL (ref 100–199)
HDL: 73 mg/dL (ref >39)
LDL Chol Calc (NIH): 36 mg/dL (ref 0–99)
Triglycerides: 112 mg/dL (ref 0–149)
VLDL Cholesterol Cal: 20 mg/dL (ref 5–40)

## 2023-12-06 LAB — HEPATIC FUNCTION PANEL
ALT: 6 IU/L (ref 0–32)
AST: 11 IU/L (ref 0–40)
Albumin: 3.4 g/dL — ABNORMAL LOW (ref 3.8–4.9)
Alkaline Phosphatase: 133 IU/L — ABNORMAL HIGH (ref 44–121)
Bilirubin Total: <0.2 mg/dL (ref 0.0–1.2)
Bilirubin, Direct: 0.09 mg/dL (ref 0.00–0.40)
Total Protein: 5.4 g/dL — ABNORMAL LOW (ref 6.0–8.5)

## 2023-12-07 ENCOUNTER — Ambulatory Visit: Admitting: Nurse Practitioner

## 2023-12-12 ENCOUNTER — Ambulatory Visit: Payer: Self-pay | Admitting: Nurse Practitioner

## 2023-12-13 ENCOUNTER — Other Ambulatory Visit: Payer: Self-pay

## 2023-12-13 ENCOUNTER — Encounter (HOSPITAL_COMMUNITY): Payer: Self-pay | Admitting: Surgery

## 2023-12-13 NOTE — Anesthesia Preprocedure Evaluation (Addendum)
 Anesthesia Evaluation  Patient identified by MRN, date of birth, ID band Patient awake    Reviewed: Allergy & Precautions, H&P , NPO status , Patient's Chart, lab work & pertinent test results  Airway Mallampati: II   Neck ROM: full    Dental   Pulmonary asthma , sleep apnea , former smoker   breath sounds clear to auscultation       Cardiovascular hypertension, + CAD and +CHF   Rhythm:regular Rate:Normal     Neuro/Psych  Headaches PSYCHIATRIC DISORDERS Anxiety Depression Bipolar Disorder    Neuromuscular disease    GI/Hepatic ,GERD  ,,  Endo/Other  diabetes, Type 2Hypothyroidism    Renal/GU ESRFRenal disease     Musculoskeletal  (+) Arthritis ,  Fibromyalgia -  Abdominal   Peds  Hematology   Anesthesia Other Findings   Reproductive/Obstetrics                              Anesthesia Physical Anesthesia Plan  ASA: 3  Anesthesia Plan: General   Post-op Pain Management:    Induction: Intravenous  PONV Risk Score and Plan: 3 and Ondansetron , Dexamethasone , Midazolam  and Treatment may vary due to age or medical condition  Airway Management Planned: LMA  Additional Equipment:   Intra-op Plan:   Post-operative Plan: Extubation in OR  Informed Consent: I have reviewed the patients History and Physical, chart, labs and discussed the procedure including the risks, benefits and alternatives for the proposed anesthesia with the patient or authorized representative who has indicated his/her understanding and acceptance.     Dental advisory given  Plan Discussed with: CRNA, Anesthesiologist and Surgeon  Anesthesia Plan Comments: (PAT note written 12/13/2023 by Allison Zelenak, PA-C.  )         Anesthesia Quick Evaluation

## 2023-12-13 NOTE — Progress Notes (Signed)
 Anesthesia Chart Review: Jasmine [name]  Case: [id number] Date/Time: 12/16/23 1234   Procedure: TRANSPOSITION, VEIN, BASILIC (Left)   Anesthesia type: Choice   Diagnosis: ESRD (end stage renal disease) (HCC) [N18.6]   Pre-op  diagnosis: ESRD   [facility]: [facility] ROOM 12 / [facility]   Surgeons: Serene Gaile ORN, MD       DISCUSSION: Patient is a 53 year old female scheduled for the above procedure. She underwent replacement of dislodged left internal jugular TDC on 11/30/23. S/p left brachio-basilic AVF 10/14/23.    History includes former smoker (quit 06/08/11), HTN, tachycardia, CAD (DES to LAD, D1, RCA 08/30/23; NSTEMI in setting of DKA 5/22025), DM1, ESRD (HD initiated 01/13/23; TTS, [facility]; left brachio-basilic AVF 10/14/23), hypercholesterolemia, OSA (CPAP, uses 3L O2 at night), hypothyroidism, GERD, Bipolar disorder, tremor, memory changes, IBS, anemia.  She was treated for allergic reaction (bilateral eye swelling, R > L) of undetermined source and without improvement after Benadryl . She was given injection of Solu-Medrol and discharged with prednisone dose pack, Zyrtec daily then as needed, Epi Pen as needed.     She was started on HD in August 2024 during admission for acute hypoxic respiratory failure and DKA. She was interested in PD catheter, but subsequently had coronary stents placed on March 2025 and was started on DAPT. Continuing DAPT for at least six months had been recommended at time of stent placement, and vascular surgeon did not think PD catheter should be placed while still on DAPT, so decision made to proceed with AVF creation while continuing DAPT perioperatively.  She is currently using a left internal jugular TDC for HD.   She also had admission 09/05/23 -09/11/23 for MRSA bacteremia and sepsis in setting of right thigh abscess and TDC. S/p Rocephin  and doxycycline , initially. TEE showed no vegetations. TDC removed briefly for line holiday and was discharged on vancomycin  with dialysis  per ID. Last ID APP follow-up was on 11/07/23. Mupirocin  ointment prescribed for nasal MRSA decolonization. As needed ID follow-up planned.   She wears a Dexcom G6 CGM and a Novolog  (100 units/mL) insulin  pump. She has Tresiba  to use if insulin  pump is malfunctioning. She had admission for DKA 10/30/23-11/03/23 in setting of malfunctioning insulin  pump. She reported fasting glucose readings ~ 100's and will reduce basal rate by 20% while NPO for surgery.     She had recent DES x3 on 08/30/23. She is to continue Plavix  and ASA perioperatively. Troponins 476->579->739->828 (peak)->381->384 during May admission for DKA. She also reported some chest tightness. Cardiology was consulted for NSTEMI, type I versus II. She was started on IV heparin  for 48 hours and did not have any further chest tightness.  10/30/24 TTE showed LVEF 55%, distal septal hypokinesis, otherwise no LV RWMA, normal diastolic parameters, normal  RV systolic function, moderate MAC, trivial MR mild aortic valve stenosis, mean gradient 6 mmHg. Cardiology did not recommended cardiac cath, but instead on-going medical therapy with ASA, Coreg , Plavix , hydralazine , Imdur , Crestor .   Her last cardiology visit with Daneen Perkins, NP was on 11/21/23. She was felt to be stable from a cardiac standpoint. Occasional mild dyspnea on exertion, but no chest pain, palpitations, dizziness, edema, PND, orthopnea, weight gain.  Consider repeat TTE in one year to reassess mild AS. Continue current medications. Follow-up in 3-4 months planned.    Anesthesia team to evaluate on the day of surgery. As above, she is continuing ASA and Plavix .    VS: Ht 5' 9 (1.753 m)  Wt 118 kg   LMP 03/23/2017 (Approximate)   BMI 38.42 kg/m  BP Readings from Last 3 Encounters:  12/05/23 (!) 145/74  12/02/23 (!) 151/65  11/30/23 131/68   Pulse Readings from Last 3 Encounters:  12/05/23 64  12/02/23 78  11/30/23 63     PROVIDERS: Debrah Josette ORN., PA-C is  PCP Wendel Haws, MD is cardiologist Jude Donning, MD is pulmonologist   LABS: For day of surgery as indicated. Most recent results in [facility] include: Lab Results  Component Value Date   WBC 5.5 12/02/2023   HGB 8.4 (L) 12/02/2023   HCT 27.4 (L) 12/02/2023   PLT 193 12/02/2023   GLUCOSE 142 (H) 12/02/2023   CHOL 129 12/05/2023   TRIG 112 12/05/2023   HDL 73 12/05/2023   LDLCALC 36 12/05/2023   ALT 6 12/05/2023   AST 11 12/05/2023   NA 138 12/02/2023   K 3.9 12/02/2023   CL 101 12/02/2023   CREATININE 3.65 (H) 12/02/2023   BUN 23 (H) 12/02/2023   CO2 25 12/02/2023   TSH 0.018 (L) 03/29/2023   INR 1.0 09/05/2023   HGBA1C 5.6 03/28/2023      LABS: For day of procedure. As of 09/10/23, glucose 352, CBC normal. A1c 5.6% on 03/19/23.      IMAGES: CXR 12/02/23: FINDINGS: Left chest tunneled hemodialysis catheter terminating in the right atrium. Bilateral perihilar interstitial opacities. Blunting of the left costophrenic sulcus. No pneumothorax. No cardiomegaly.No acute fracture or destructive lesion.  IMPRESSION: Findings suggestive of interstitial edema with a small left pleural effusion.      EKG:  EKG 12/02/23: Sinus rhythm Probable anteroseptal infarct, old no significant change since Mar 2025 Confirmed by Freddi Hamilton [phone]) on 12/02/2023 8:18:11 PM     CV: TTE 10/31/23: IMPRESSIONS   1. Distal septal hypokinesis . Left ventricular ejection fraction, by  estimation, is 55%. The left ventricle has low normal function. The left  ventricle has no regional wall motion abnormalities. Left ventricular  diastolic parameters were normal.   2. Right ventricular systolic function is normal. The right ventricular  size is normal.   3. Left atrial size was moderately dilated.   4. Right atrial size was ? dialysis catheter in RA.   5. The mitral valve is abnormal. Trivial mitral valve regurgitation. No  evidence of mitral stenosis. Moderate mitral annular  calcification.   6. The aortic valve is tricuspid. There is moderate calcification of the  aortic valve. There is moderate thickening of the aortic valve. Aortic  valve regurgitation is not visualized. Mild aortic valve stenosis.   7. The inferior vena cava is normal in size with greater than 50%  respiratory variability, suggesting right atrial pressure of 3 mmHg.    TEE 09/08/23: IMPRESSIONS   1. Left ventricular ejection fraction, by estimation, is 60 to 65%. The  left ventricle has normal function.   2. Right ventricular systolic function is normal. The right ventricular  size is normal.   3. Left atrial size was mildly dilated. No left atrial/left atrial  appendage thrombus was detected. The LAA emptying velocity was 60 cm/s.   4. The mitral valve is normal in structure. Trivial mitral valve  regurgitation. No evidence of mitral stenosis.   5. The aortic valve is tricuspid. Aortic valve regurgitation is not  visualized. No aortic stenosis is present.   Conclusion(s)/Recommendation(s): No evidence of vegetation/infective  endocarditis on this transesophageael echocardiogram.      Cardiac cath 08/30/23:   Mid LAD  to Dist LAD lesion is 80% stenosed.   Prox RCA to Mid RCA lesion is 70% stenosed.   1st Diag lesion is 80% stenosed.   A stent was successfully placed.   A stent was successfully placed.   A stent was successfully placed.   Post intervention, there is a 0% residual stenosis.   Post intervention, there is a 0% residual stenosis.   Post intervention, there is a 0% residual stenosis.   1.  Low syntax score multivessel disease treated involving the first diagonal, mid to distal LAD, and mid right coronary artery. 2.  DES x 1 first diagonal. 3.  DES x 2 mid to distal LAD with OCT imaging optimization. 4.  DES x 1 mid RCA. 5.  LVEDP of 33 mmHg.   Recommendation: Same-day discharge after PCI with 6 hours bedrest.  Dual antiplatelet therapy for preferably 6 months and then  Plavix  monotherapy indefinitely.     US  Carotid 05/13/22: Summary:  Right Carotid: The extracranial vessels were near-normal with only minimal wall thickening or plaque.  Left Carotid: The extracranial vessels were near-normal with only minimal wall thickening or plaque.  Vertebrals:  Bilateral vertebral arteries demonstrate antegrade flow.  Subclavians: Normal flow hemodynamics were seen in bilateral subclavian arteries.   Past Medical History:  Diagnosis Date   Anemia    Anxiety    Arthritis    Asthma    Balance problems    Bipolar disorder (HCC)    Charcot ankle    CHF (congestive heart failure) (HCC)    Chronic fatigue    Coronary artery disease    Depression    DKA, type 1 (HCC) 11/04/2011   Elevated cholesterol    ESRD on hemodialysis (HCC)    TTS at [facility]   Fibromyalgia    GERD (gastroesophageal reflux disease)    Headache    History of suicidal ideation    Hyperlipemia    Hypertension    Hypothyroidism    IBS (irritable bowel syndrome)    Memory changes    Obesity    Pneumonia    x several   Sleep apnea    uses oxygen 3L at night with cpap nightly   Stress incontinence    Pt had surgery to correct this.   Tobacco abuse    Tremor     Past Surgical History:  Procedure Laterality Date   AV FISTULA PLACEMENT Left 10/14/2023   Procedure: BRACHIOBASILIC ARTERIOVENOUS (AV) FISTULA CREATION LEFT;  Surgeon: Serene Gaile ORN, MD;  Location: [facility];  Service: Vascular;  Laterality: Left;   CORONARY IMAGING/OCT N/A 08/30/2023   Procedure: CORONARY IMAGING/OCT;  Surgeon: Wendel Lurena POUR, MD;  Location: [facility] INVASIVE CV LAB;  Service: Cardiovascular;  Laterality: N/A;   CORONARY STENT INTERVENTION N/A 08/30/2023   Procedure: CORONARY STENT INTERVENTION;  Surgeon: Wendel Lurena POUR, MD;  Location: [facility] INVASIVE CV LAB;  Service: Cardiovascular;  Laterality: N/A;   DIALYSIS/PERMA CATHETER INSERTION N/A 11/30/2023   Procedure: DIALYSIS/PERMA CATHETER INSERTION;  Surgeon:  Pearline Norman RAMAN, MD;  Location: [facility] PV LAB;  Service: Cardiovascular;  Laterality: N/A;   INCONTINENCE SURGERY     IR FLUORO GUIDE CV LINE LEFT  09/10/2023   IR US  GUIDE VASC ACCESS LEFT  09/10/2023   LEFT HEART CATH AND CORONARY ANGIOGRAPHY N/A 07/15/2023   Procedure: LEFT HEART CATH AND CORONARY ANGIOGRAPHY;  Surgeon: Wendel Lurena POUR, MD;  Location: [facility] CV LAB;  Service: Cardiovascular;  Laterality: N/A;   LEFT HEART CATH  AND CORONARY ANGIOGRAPHY N/A 08/30/2023   Procedure: LEFT HEART CATH AND CORONARY ANGIOGRAPHY;  Surgeon: Wendel Lurena POUR, MD;  Location: [facility] INVAS[facility] CV LAB;  Service: Cardiovascular;  Laterality: N/A;   NASAL FRACTURE SURGERY     ovary removed     OVARY SURGERY     PUBOVAGINAL SLING  08/16/2011   Procedure: CARLOYN GLADE;  Surgeon: Alm GORMAN Fragmin, MD;  Location: [facility] ORS;  Service: Urology;  Laterality: N/A;          TEE WITHOUT CARDIOVERSION N/A 09/08/2023   Procedure: ECHOCARDIOGRAM, TRANSESOPHAGEAL;  Surgeon: Alvan Dorn FALCON, MD;  Location: [facility] ORS;  Service: Endoscopy;  Laterality: N/A;   TUNNELLED CATHETER EXCHANGE N/A 09/30/2023   Procedure: TUNNELLED CATHETER EXCHANGE;  Surgeon: Melia Lynwood ORN, MD;  Location: [facility] INVASIVE CV LAB;  Service: Cardiovascular;  Laterality: N/A;   TUNNELLED CATHETER EXCHANGE N/A 10/07/2023   Procedure: TUNNELLED CATHETER EXCHANGE;  Surgeon: Tobie Gordy POUR, MD;  Location: [facility] INVASIVE CV LAB;  Service: Cardiovascular;  Laterality: N/A;   UTERINE FIBROID SURGERY  2001    MEDICATIONS: No current facility-administered medications for this encounter.    acetaminophen  (TYLENOL ) 500 MG tablet   aspirin  EC 81 MG tablet   buPROPion  (WELLBUTRIN  XL) 150 MG 24 hr tablet   carvedilol  (COREG ) 12.5 MG tablet   Cholecalciferol  (VITAMIN D -3 PO)   clopidogrel  (PLAVIX ) 75 MG tablet   diclofenac  Sodium (VOLTAREN ) 1 % GEL   diphenhydrAMINE  (BENADRYL ) 25 MG tablet   DULoxetine  (CYMBALTA ) 30 MG capsule   EPINEPHrine  0.3 mg/0.3 mL IJ  SOAJ injection   FOLIC ACID  PO   hydrALAZINE  (APRESOLINE ) 25 MG tablet   insulin  aspart (NOVOLOG ) 100 UNIT/ML FlexPen   isosorbide  mononitrate (IMDUR ) 30 MG 24 hr tablet   lamoTRIgine  (LAMICTAL ) 200 MG tablet   levothyroxine  (SYNTHROID ) 175 MCG tablet   Melatonin 10 MG TABS   mupirocin  ointment (BACTROBAN ) 2 %   ondansetron  (ZOFRAN -ODT) 4 MG disintegrating tablet   oxyCODONE -acetaminophen  (PERCOCET/ROXICET) 5-325 MG tablet   PRESCRIPTION MEDICATION   rizatriptan  (MAXALT ) 10 MG tablet   rosuvastatin  (CRESTOR ) 10 MG tablet   sevelamer  carbonate (RENVELA ) 800 MG tablet   torsemide  (DEMADEX ) 20 MG tablet   Blood Glucose Monitoring Suppl DEVI   Continuous Blood Gluc Sensor MISC   glucagon (GLUCAGEN  HYPOKIT) 1 MG SOLR injection   Glucose Blood (BLOOD GLUCOSE TEST STRIPS) STRP   insulin  degludec (TRESIBA ) 100 UNIT/ML FlexTouch Pen   Insulin  Pen Needle (PEN NEEDLES) 31G X 5 MM MISC   Lancet Device MISC   Lancets MISC   OXYGEN    Isaiah Ruder, PA-C Surgical Short Stay/Anesthesiology [facility] Phone [phone] [facility] Phone [phone] 12/13/2023 3:48 PM

## 2023-12-13 NOTE — Progress Notes (Signed)
 PCP - Josette Aho, PA-C Cardiologist - Dr Shelda Bruckner  Chest x-ray - 12/02/23 EKG - 12/02/23 Stress Test - 06/15/23 ECHO - 10/31/23 Cardiac Cath - 08/30/23  ICD Pacemaker/Loop - n/a  Sleep Study -  Yes (11/2019) CPAP - uses CPAP nightly  Diabetes Type 1 Dexcom G6 System, Sensor on left arm Novolog  Insulin  pump - Reduce basal rates by 20% at bedtime Thursday per anesthesia protocol.. Diabetes Co-ordinator Shona Collier was informed (via phone) of patient's Insulin  Pump along with patient's surgery date/time.    Fasting Blood Sugar - 100s  Checks Blood Sugar several times a day  Continue Aspirin  and Plavix  per MD.  NPO   Anesthesia review: Yes  STOP now taking any Aspirin  (unless otherwise instructed by your surgeon), Aleve, Naproxen, Ibuprofen, Motrin, Advil, Voltaren , Goody's, BC's, all herbal medications, fish oil, and all vitamins.   Coronavirus Screening Do you have any of the following symptoms:  Cough yes/no: No Fever (>100.35F)  yes/no: No Runny nose yes/no: No Sore throat yes/no: No Difficulty breathing/shortness of breath  yes/no: No  Have you traveled in the last 14 days and where? yes/no: No  Patient verbalized understanding of instructions that were given via phone.

## 2023-12-16 ENCOUNTER — Encounter (HOSPITAL_COMMUNITY): Payer: Self-pay | Admitting: Surgery

## 2023-12-16 ENCOUNTER — Ambulatory Visit (HOSPITAL_COMMUNITY)
Admission: RE | Admit: 2023-12-16 | Discharge: 2023-12-16 | Disposition: A | Discharge status: Home / Self Care | Attending: Surgery | Admitting: Surgery

## 2023-12-16 ENCOUNTER — Other Ambulatory Visit: Payer: Self-pay

## 2023-12-16 ENCOUNTER — Ambulatory Visit (HOSPITAL_COMMUNITY): Payer: Self-pay | Admitting: Physician Assistant

## 2023-12-16 ENCOUNTER — Encounter (HOSPITAL_COMMUNITY)
Admission: RE | Disposition: A | Discharge status: Home / Self Care | Payer: Self-pay | Source: Home / Self Care | Attending: Surgery

## 2023-12-16 DIAGNOSIS — Z794 Long term (current) use of insulin: Secondary | ICD-10-CM | POA: Insufficient documentation

## 2023-12-16 DIAGNOSIS — Z6837 Body mass index (BMI) 37.0-37.9, adult: Secondary | ICD-10-CM | POA: Diagnosis not present

## 2023-12-16 DIAGNOSIS — Z87891 Personal history of nicotine dependence: Secondary | ICD-10-CM | POA: Insufficient documentation

## 2023-12-16 DIAGNOSIS — N186 End stage renal disease: Secondary | ICD-10-CM

## 2023-12-16 DIAGNOSIS — M1990 Unspecified osteoarthritis, unspecified site: Secondary | ICD-10-CM | POA: Diagnosis not present

## 2023-12-16 DIAGNOSIS — I132 Hypertensive heart and chronic kidney disease with heart failure and with stage 5 chronic kidney disease, or end stage renal disease: Secondary | ICD-10-CM | POA: Diagnosis not present

## 2023-12-16 DIAGNOSIS — E1022 Type 1 diabetes mellitus with diabetic chronic kidney disease: Secondary | ICD-10-CM | POA: Insufficient documentation

## 2023-12-16 DIAGNOSIS — F419 Anxiety disorder, unspecified: Secondary | ICD-10-CM | POA: Insufficient documentation

## 2023-12-16 DIAGNOSIS — I509 Heart failure, unspecified: Secondary | ICD-10-CM | POA: Diagnosis not present

## 2023-12-16 DIAGNOSIS — Z955 Presence of coronary angioplasty implant and graft: Secondary | ICD-10-CM

## 2023-12-16 DIAGNOSIS — J45909 Unspecified asthma, uncomplicated: Secondary | ICD-10-CM | POA: Diagnosis not present

## 2023-12-16 DIAGNOSIS — Z992 Dependence on renal dialysis: Secondary | ICD-10-CM | POA: Insufficient documentation

## 2023-12-16 DIAGNOSIS — M797 Fibromyalgia: Secondary | ICD-10-CM | POA: Diagnosis not present

## 2023-12-16 DIAGNOSIS — G4730 Sleep apnea, unspecified: Secondary | ICD-10-CM | POA: Insufficient documentation

## 2023-12-16 DIAGNOSIS — I350 Nonrheumatic aortic (valve) stenosis: Secondary | ICD-10-CM | POA: Diagnosis not present

## 2023-12-16 DIAGNOSIS — E669 Obesity, unspecified: Secondary | ICD-10-CM | POA: Insufficient documentation

## 2023-12-16 DIAGNOSIS — Z79899 Other long term (current) drug therapy: Secondary | ICD-10-CM | POA: Diagnosis not present

## 2023-12-16 DIAGNOSIS — F319 Bipolar disorder, unspecified: Secondary | ICD-10-CM | POA: Insufficient documentation

## 2023-12-16 DIAGNOSIS — Z7982 Long term (current) use of aspirin: Secondary | ICD-10-CM | POA: Insufficient documentation

## 2023-12-16 DIAGNOSIS — E039 Hypothyroidism, unspecified: Secondary | ICD-10-CM | POA: Diagnosis not present

## 2023-12-16 DIAGNOSIS — I3481 Nonrheumatic mitral (valve) annulus calcification: Secondary | ICD-10-CM | POA: Insufficient documentation

## 2023-12-16 DIAGNOSIS — Z7902 Long term (current) use of antithrombotics/antiplatelets: Secondary | ICD-10-CM

## 2023-12-16 DIAGNOSIS — R519 Headache, unspecified: Secondary | ICD-10-CM | POA: Insufficient documentation

## 2023-12-16 DIAGNOSIS — T82590A Other mechanical complication of surgically created arteriovenous fistula, initial encounter: Secondary | ICD-10-CM

## 2023-12-16 DIAGNOSIS — Z833 Family history of diabetes mellitus: Secondary | ICD-10-CM | POA: Insufficient documentation

## 2023-12-16 DIAGNOSIS — I2510 Atherosclerotic heart disease of native coronary artery without angina pectoris: Secondary | ICD-10-CM | POA: Diagnosis not present

## 2023-12-16 DIAGNOSIS — K219 Gastro-esophageal reflux disease without esophagitis: Secondary | ICD-10-CM | POA: Diagnosis not present

## 2023-12-16 HISTORY — DX: Pneumonia, unspecified organism: J18.9

## 2023-12-16 HISTORY — PX: BASCILIC VEIN TRANSPOSITION: SHX5742

## 2023-12-16 HISTORY — DX: Heart failure, unspecified: I50.9

## 2023-12-16 LAB — GLUCOSE, CAPILLARY
Glucose-Capillary: 69 mg/dL — ABNORMAL LOW (ref 70–99)
Glucose-Capillary: 82 mg/dL (ref 70–99)
Glucose-Capillary: 177 mg/dL — ABNORMAL HIGH (ref 70–99)
Glucose-Capillary: 233 mg/dL — ABNORMAL HIGH (ref 70–99)
Glucose-Capillary: 230 mg/dL — ABNORMAL HIGH (ref 70–99)
Glucose-Capillary: 102 mg/dL — ABNORMAL HIGH (ref 70–99)
Glucose-Capillary: 112 mg/dL — ABNORMAL HIGH (ref 70–99)
Glucose-Capillary: 26 mg/dL — CL (ref 70–99)
Glucose-Capillary: 27 mg/dL — CL (ref 70–99)
Glucose-Capillary: 57 mg/dL — ABNORMAL LOW (ref 70–99)
Glucose-Capillary: 67 mg/dL — ABNORMAL LOW (ref 70–99)

## 2023-12-16 LAB — POCT I-STAT, CHEM 8
BUN: 28 mg/dL — ABNORMAL HIGH (ref 6–20)
Calcium, Ion: 0.93 mmol/L — ABNORMAL LOW (ref 1.15–1.40)
Chloride: 100 mmol/L (ref 98–111)
Creatinine, Ser: 4 mg/dL — ABNORMAL HIGH (ref 0.44–1.00)
Glucose, Bld: 62 mg/dL — ABNORMAL LOW (ref 70–99)
HCT: 27 % — ABNORMAL LOW (ref 36.0–46.0)
Hemoglobin: 9.2 g/dL — ABNORMAL LOW (ref 12.0–15.0)
Potassium: 4.2 mmol/L (ref 3.5–5.1)
Sodium: 135 mmol/L (ref 135–145)
TCO2: 24 mmol/L (ref 22–32)

## 2023-12-16 SURGERY — TRANSPOSITION, VEIN, BASILIC
Anesthesia: General | Laterality: Left

## 2023-12-16 MED ORDER — PROPOFOL 10 MG/ML IV BOLUS
INTRAVENOUS | Status: DC | PRN
  Administered 2023-12-16: 200 mg via INTRAVENOUS

## 2023-12-16 MED ORDER — DEXTROSE 50 % IV SOLN
25.0000 mL | Freq: Once | INTRAVENOUS | Status: AC
  Administered 2023-12-16: 25 mL via INTRAVENOUS

## 2023-12-16 MED ORDER — PROPOFOL 500 MG/50ML IV EMUL
INTRAVENOUS | Status: DC | PRN
  Administered 2023-12-16: 50 ug/kg/min via INTRAVENOUS

## 2023-12-16 MED ORDER — LIDOCAINE 2% (20 MG/ML) 5 ML SYRINGE
INTRAMUSCULAR | Status: DC | PRN
  Administered 2023-12-16: 60 mg via INTRAVENOUS

## 2023-12-16 MED ORDER — MIDAZOLAM HCL 2 MG/2ML IJ SOLN
INTRAMUSCULAR | Status: AC
  Filled 2023-12-16: qty 2

## 2023-12-16 MED ORDER — ORAL CARE MOUTH RINSE
15.0000 mL | Freq: Once | OROMUCOSAL | Status: AC
Start: 2023-12-16 — End: 2023-12-16

## 2023-12-16 MED ORDER — SODIUM CHLORIDE 0.9 % IV SOLN: INTRAVENOUS | Status: DC

## 2023-12-16 MED ORDER — SURGIFLO WITH THROMBIN (HEMOSTATIC MATRIX KIT) OPTIME
TOPICAL | Status: DC | PRN
  Administered 2023-12-16: 1 via TOPICAL

## 2023-12-16 MED ORDER — FENTANYL CITRATE (PF) 250 MCG/5ML IJ SOLN
INTRAMUSCULAR | Status: AC
  Filled 2023-12-16: qty 5

## 2023-12-16 MED ORDER — BUPIVACAINE HCL (PF) 0.5 % IJ SOLN
INTRAMUSCULAR | Status: AC
  Filled 2023-12-16: qty 30

## 2023-12-16 MED ORDER — SODIUM CHLORIDE FLUSH 0.9 % IV SOLN
INTRAVENOUS | Status: DC | PRN
  Administered 2023-12-16: 70 mL

## 2023-12-16 MED ORDER — HEPARIN 6000 UNIT IRRIGATION SOLUTION
Status: DC | PRN
  Administered 2023-12-16: 1

## 2023-12-16 MED ORDER — LIDOCAINE 2% (20 MG/ML) 5 ML SYRINGE
INTRAMUSCULAR | Status: AC
  Filled 2023-12-16: qty 5

## 2023-12-16 MED ORDER — SODIUM CHLORIDE (PF) 0.9 % IJ SOLN
INTRAMUSCULAR | Status: AC
  Filled 2023-12-16: qty 50

## 2023-12-16 MED ORDER — BUPIVACAINE LIPOSOME 1.3 % IJ SUSP
INTRAMUSCULAR | Status: AC
  Filled 2023-12-16: qty 20

## 2023-12-16 MED ORDER — INSULIN ASPART 100 UNIT/ML IJ SOLN
0.0000 [IU] | INTRAMUSCULAR | Status: AC | PRN
  Administered 2023-12-16: 3 [IU] via SUBCUTANEOUS
  Administered 2023-12-16: 6 [IU] via SUBCUTANEOUS

## 2023-12-16 MED ORDER — OXYCODONE-ACETAMINOPHEN 5-325 MG PO TABS: 1.0000 | ORAL_TABLET | Freq: Four times a day (QID) | ORAL | 0 refills | Status: DC | PRN

## 2023-12-16 MED ORDER — SODIUM CHLORIDE 0.9% FLUSH: 3.0000 mL | INTRAVENOUS | Status: DC | PRN

## 2023-12-16 MED ORDER — DEXAMETHASONE SODIUM PHOSPHATE 10 MG/ML IJ SOLN
INTRAMUSCULAR | Status: AC
  Filled 2023-12-16: qty 1

## 2023-12-16 MED ORDER — MIDAZOLAM HCL 2 MG/2ML IJ SOLN
INTRAMUSCULAR | Status: DC | PRN
  Administered 2023-12-16: 2 mg via INTRAVENOUS

## 2023-12-16 MED ORDER — CHLORHEXIDINE GLUCONATE 4 % EX SOLN: 60.0000 mL | Freq: Once | CUTANEOUS | Status: DC

## 2023-12-16 MED ORDER — PROPOFOL 10 MG/ML IV BOLUS
INTRAVENOUS | Status: AC
  Filled 2023-12-16: qty 20

## 2023-12-16 MED ORDER — FENTANYL CITRATE (PF) 250 MCG/5ML IJ SOLN
INTRAMUSCULAR | Status: DC | PRN
  Administered 2023-12-16: 50 ug via INTRAVENOUS

## 2023-12-16 MED ORDER — HEPARIN 6000 UNIT IRRIGATION SOLUTION
Status: AC
  Filled 2023-12-16: qty 500

## 2023-12-16 MED ORDER — INSULIN ASPART 100 UNIT/ML IJ SOLN
INTRAMUSCULAR | Status: AC
  Filled 2023-12-16: qty 1

## 2023-12-16 MED ORDER — 0.9 % SODIUM CHLORIDE (POUR BTL) OPTIME
TOPICAL | Status: DC | PRN
  Administered 2023-12-16: 1000 mL

## 2023-12-16 MED ORDER — ONDANSETRON HCL 4 MG/2ML IJ SOLN
INTRAMUSCULAR | Status: AC
  Filled 2023-12-16: qty 2

## 2023-12-16 MED ORDER — CEFAZOLIN SODIUM-DEXTROSE 2-4 GM/100ML-% IV SOLN
2.0000 g | INTRAVENOUS | Status: AC
  Administered 2023-12-16: 2 g via INTRAVENOUS
  Filled 2023-12-16: qty 100

## 2023-12-16 MED ORDER — CHLORHEXIDINE GLUCONATE 0.12 % MT SOLN
15.0000 mL | Freq: Once | OROMUCOSAL | Status: AC
  Administered 2023-12-16: 15 mL via OROMUCOSAL
  Filled 2023-12-16: qty 15

## 2023-12-16 MED ORDER — DEXTROSE 50 % IV SOLN
INTRAVENOUS | Status: AC
  Filled 2023-12-16: qty 50

## 2023-12-16 MED ORDER — PHENYLEPHRINE HCL-NACL 20-0.9 MG/250ML-% IV SOLN
INTRAVENOUS | Status: DC | PRN
  Administered 2023-12-16: 30 ug/min via INTRAVENOUS

## 2023-12-16 SURGICAL SUPPLY — 40 items
ARMBAND PINK RESTRICT EXTREMIT (MISCELLANEOUS) ×1 IMPLANT
BAG COUNTER SPONGE SURGICOUNT (BAG) IMPLANT
BIOPATCH RED 1 DISK 7.0 (GAUZE/BANDAGES/DRESSINGS) IMPLANT
CANISTER SUCTION 3000ML PPV (SUCTIONS) ×1 IMPLANT
CLIP TI MEDIUM 24 (CLIP) IMPLANT
CLIP TI MEDIUM 6 (CLIP) IMPLANT
CLIP TI WIDE RED SMALL 24 (CLIP) IMPLANT
CLIP TI WIDE RED SMALL 6 (CLIP) IMPLANT
CNTNR URN SCR LID CUP LEK RST (MISCELLANEOUS) ×1 IMPLANT
COVER PROBE W GEL 5X96 (DRAPES) ×1 IMPLANT
DERMABOND ADVANCED .7 DNX12 (GAUZE/BANDAGES/DRESSINGS) ×1 IMPLANT
DRAIN CHANNEL 15F RND FF W/TCR (WOUND CARE) IMPLANT
ELECTRODE REM PT RTRN 9FT ADLT (ELECTROSURGICAL) ×1 IMPLANT
EVACUATOR SILICONE 100CC (DRAIN) IMPLANT
GAUZE SPONGE 4X4 12PLY STRL (GAUZE/BANDAGES/DRESSINGS) IMPLANT
GLOVE SURG SS PI 7.5 STRL IVOR (GLOVE) ×1 IMPLANT
GOWN STRL REUS W/ TWL LRG LVL3 (GOWN DISPOSABLE) ×2 IMPLANT
GOWN STRL REUS W/ TWL XL LVL3 (GOWN DISPOSABLE) ×1 IMPLANT
HEMOSTAT SNOW SURGICEL 2X4 (HEMOSTASIS) IMPLANT
KIT BASIN OR (CUSTOM PROCEDURE TRAY) ×1 IMPLANT
KIT TURNOVER KIT B (KITS) ×1 IMPLANT
LOOP VESSEL MINI RED (MISCELLANEOUS) IMPLANT
NDL 18GX1X1/2 (RX/OR ONLY) (NEEDLE) ×1 IMPLANT
NEEDLE 18GX1X1/2 (RX/OR ONLY) (NEEDLE) ×1 IMPLANT
NS IRRIG 1000ML POUR BTL (IV SOLUTION) ×1 IMPLANT
PACK CV ACCESS (CUSTOM PROCEDURE TRAY) ×1 IMPLANT
PAD ARMBOARD POSITIONER FOAM (MISCELLANEOUS) ×2 IMPLANT
PENCIL SMOKE EVACUATOR (MISCELLANEOUS) IMPLANT
SLING ARM FOAM STRAP LRG (SOFTGOODS) IMPLANT
SURGIFLO W/THROMBIN 8M KIT (HEMOSTASIS) IMPLANT
SUT ETHILON 3 0 PS 1 (SUTURE) IMPLANT
SUT PROLENE 6 0 BV (SUTURE) ×1 IMPLANT
SUT SILK 2 0 SH (SUTURE) IMPLANT
SUT VIC AB 3-0 SH 27X BRD (SUTURE) ×1 IMPLANT
SUT VIC AB 4-0 PS2 18 (SUTURE) ×1 IMPLANT
SYR 30ML LL (SYRINGE) ×1 IMPLANT
TAPE CLOTH SURG 4X10 WHT LF (GAUZE/BANDAGES/DRESSINGS) IMPLANT
TOWEL GREEN STERILE (TOWEL DISPOSABLE) ×1 IMPLANT
UNDERPAD 30X36 HEAVY ABSORB (UNDERPADS AND DIAPERS) ×1 IMPLANT
WATER STERILE IRR 1000ML POUR (IV SOLUTION) ×1 IMPLANT

## 2023-12-16 NOTE — Op Note (Signed)
    Patient name: Jasmine [name] MRN: [id number] DOB: 12-Aug-1970 Sex: female  12/16/2023 Pre-operative Diagnosis: ESRD Post-operative diagnosis:  Same Surgeon:  [name] Assistants:  CHRISTELLA. Eveland, PA Procedure:   Revision of left brachiobasilic fistula including transposition and branch ligation Anesthesia:  General Blood Loss:  minimal Specimens:  none  Findings: Excellent caliber vein.  Because of the patient's use of Plavix  due to her coronary stenting, I placed a 15 Blake drain which she will go home with and have taken out in the office on Monday  Indications: This is a 54 year old female who has undergone a first stage left brachiobasilic fistula.  She has a history of recent PCI with dual antiplatelet therapy which has not been discontinued.  She is here today for her second stage.  Procedure:  The patient was identified in the holding area and taken to [facility] OR ROOM 16  The patient was then placed supine on the table. general anesthesia was administered.  The patient was prepped and draped in the usual sterile fashion.  A time out was called and antibiotics were administered.  A PA was necessary to expedite the procedure and assist with technical details.  He helped with exposure by providing suction and retraction.  He helped with the anastomosis by following the suture.  He helped with wound closure.  Ultrasound was used to map the course of the basilic vein in the left arm.  2 longitudinal incisions were made anterior to the fistula.  Cautery was used to divide the subcutaneous tissue.  I then dissected out the basilic vein from antecubital crease up to the axilla.  Multiple side branches were ligated between silk ties.  The vein was then marked for orientation.  It was occluded with vascular clamps and divided near the antecubital crease.  I then used a ink pen to mark out the course of the tunnel.  The tunnel was infiltrated with Exparel .  A curved Gore tunneler was used to create a  subcutaneous tunnel.  The vein was then brought through the tunnel making sure to maintain proper orientation.  A primary end to end anastomosis was performed with 6-0 Prolene.  Once this was completed the appropriate flushing maneuvers were performed.  The clamps were then released.  There were no kinks within the fistula.  There was an excellent thrill.  The wound was then irrigated.  Hemostasis was achieved.  Surgiflo was placed on the dissection bed.  I elected to bring out a 15 Blake drain near the antecubital crease and placed this in the vein dissection bed, given her ongoing need for Plavix .  The subcutaneous tissue was closed with running 3-0 Vicryl and the skin was closed with 4-0 Vicryl followed by Dermabond and sterile dressings.  There were no immediate complications.    Disposition: To PACU stable   V. [name], M.D., [facility] [place] Office: [phone] Pager:  [phone]

## 2023-12-16 NOTE — Discharge Instructions (Addendum)
 [facility]  Discharge Instructions  AV Fistula or Graft Surgery for Dialysis Access  Please refer to the following instructions for your post-procedure care. Your surgeon or physician assistant will discuss any changes with you.  Activity  You may drive the day following your surgery, if you are comfortable and no longer taking prescription pain medication. Resume full activity as the soreness in your incision resolves.  Bathing/Showering  You may shower after you go home. Keep your incision dry for 48 hours. Do not soak in a bathtub, hot tub, or swim until the incision heals completely. You may not shower if you have a hemodialysis catheter.  Incision Care  Clean your incision with mild soap and water  after 48 hours. Pat the area dry with a clean towel. You do not need a bandage unless otherwise instructed. Do not apply any ointments or creams to your incision. You may have skin glue on your incision. Do not peel it off. It will come off on its own in about one week. Your arm may swell a bit after surgery. To reduce swelling use pillows to elevate your arm so it is above your heart. Your doctor will tell you if you need to lightly wrap your arm with an ACE bandage.  Diet  Resume your normal diet. There are not special food restrictions following this procedure. In order to heal from your surgery, it is CRITICAL to get adequate nutrition. Your body requires vitamins, minerals, and protein. Vegetables are the best source of vitamins and minerals. Vegetables also provide the perfect balance of protein. Processed food has little nutritional value, so try to avoid this.  Medications  Resume taking all of your medications. If your incision is causing pain, you may take over-the counter pain relievers such as acetaminophen  (Tylenol ). If you were prescribed a stronger pain medication, please be aware these medications can cause nausea and constipation.  Prevent nausea by taking the medication with a snack or meal. Avoid constipation by drinking plenty of fluids and eating foods with high amount of fiber, such as fruits, vegetables, and grains.  Do not take Tylenol  if you are taking prescription pain medications.  Follow up Your surgeon may want to see you in the office following your access surgery. If so, this will be arranged at the time of your surgery.  Please call us  immediately for any of the following conditions:  Increased pain, redness, drainage (pus) from your incision site Fever of 101 degrees or higher Severe or worsening pain at your incision site Hand pain or numbness.  Reduce your risk of vascular disease:  Stop smoking. If you would like help, call [facility] at 1-800-QUIT-NOW (865-588-5366) or [facility] at 361-014-3998  Manage your cholesterol Maintain a desired weight Control your diabetes Keep your blood pressure down  Dialysis  It will take several weeks to several months for your new dialysis access to be ready for use. Your surgeon will determine when it is okay to use it. Your nephrologist will continue to direct your dialysis. You can continue to use your Permcath until your new access is ready for use.   12/16/2023 Jasmine [name] [id number] June 30, 1970  Surgeon(s): Serene Gaile ORN, MD  Procedure(s): TRANSPOSITION, VEIN, BASILIC   May stick graft immediately   May stick graft on designated area only:   X Do not stick Left AV fistula for 6 weeks    If you have any questions, please call the office at [phone].

## 2023-12-16 NOTE — Interval H&P Note (Signed)
 History and Physical Interval Note:  12/16/2023 1:19 PM  Jasmine [name]  has presented today for surgery, with the diagnosis of ESRD.  The various methods of treatment have been discussed with the patient and family. After consideration of risks, benefits and other options for treatment, the patient has consented to  Procedure(s): TRANSPOSITION, VEIN, BASILIC (Left) as a surgical intervention.  The patient's history has been reviewed, patient examined, no change in status, stable for surgery.  I have reviewed the patient's chart and labs.  Questions were answered to the patient's satisfaction.     [name]

## 2023-12-16 NOTE — Progress Notes (Signed)
 Patient had sunny d at 0630 this morning. Dr. Boone made aware. Ok to proceed to surgery at 1400 as scheduled.

## 2023-12-16 NOTE — Transfer of Care (Signed)
 Immediate Anesthesia Transfer of Care Note  Patient: Jasmine [name]  Procedure(s) Performed: TRANSPOSITION, VEIN, BASILIC (Left)  Patient Location: PACU  Anesthesia Type:General  Level of Consciousness: awake, alert , and oriented  Airway & Oxygen Therapy: Patient Spontanous Breathing  Post-op Assessment: Report given to RN and Post -op Vital signs reviewed and stable  Post vital signs: Reviewed and stable  Last Vitals:  Vitals Value Taken Time  BP 139/68 12/16/23 16:30  Temp 98   Pulse 72 12/16/23 16:31  Resp 15 12/16/23 16:31  SpO2 95 % 12/16/23 16:31  Vitals shown include unfiled device data.  Last Pain:  Vitals:   12/16/23 1053  TempSrc: Oral  PainSc: 0-No pain         Complications: No notable events documented.

## 2023-12-16 NOTE — Anesthesia Procedure Notes (Signed)
 Procedure Name: LMA Insertion Date/Time: 12/16/2023 2:40 PM  Performed by: Zelphia Norleen HERO, CRNAPre-anesthesia Checklist: Patient identified, Emergency Drugs available, Suction available and Patient being monitored Patient Re-evaluated:Patient Re-evaluated prior to induction Oxygen Delivery Method: Circle system utilized Preoxygenation: Pre-oxygenation with 100% oxygen Induction Type: IV induction Ventilation: Mask ventilation without difficulty LMA: LMA inserted LMA Size: 4.0 Tube type: Oral Number of attempts: 1 Placement Confirmation: positive ETCO2 and breath sounds checked- equal and bilateral Tube secured with: Tape Dental Injury: Teeth and Oropharynx as per pre-operative assessment

## 2023-12-17 NOTE — Anesthesia Postprocedure Evaluation (Signed)
 Anesthesia Post Note  Patient: ASHUNTI SCHOFIELD  Procedure(s) Performed: TRANSPOSITION, VEIN, BASILIC (Left)     Patient location during evaluation: PACU Anesthesia Type: General Level of consciousness: awake and alert Pain management: pain level controlled Vital Signs Assessment: post-procedure vital signs reviewed and stable Respiratory status: spontaneous breathing, nonlabored ventilation, respiratory function stable and patient connected to nasal cannula oxygen Cardiovascular status: blood pressure returned to baseline and stable Postop Assessment: no apparent nausea or vomiting Anesthetic complications: no   No notable events documented.  Last Vitals:  Vitals:   12/16/23 1815 12/16/23 1830  BP: (!) 161/70 138/73  Pulse: 70 71  Resp: 18 16  Temp:  36.6 C  SpO2: 94% 93%    Last Pain:  Vitals:   12/16/23 1830  TempSrc:   PainSc: 0-No pain                 Lynwood MARLA Cornea

## 2023-12-19 ENCOUNTER — Ambulatory Visit: Attending: Surgery | Admitting: Physician Assistant

## 2023-12-19 ENCOUNTER — Encounter (HOSPITAL_COMMUNITY): Payer: Self-pay | Admitting: Surgery

## 2023-12-19 VITALS — BP 166/66 | HR 70 | Temp 97.9°F | Ht 69.0 in | Wt 266.6 lb

## 2023-12-19 DIAGNOSIS — N186 End stage renal disease: Secondary | ICD-10-CM

## 2023-12-19 NOTE — Progress Notes (Signed)
 POST OPERATIVE OFFICE NOTE    CC:  F/u for surgery  HPI:  This is a 53 y.o. 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 Serene.    Given the fact she was on plavix , he placed a JP drain and have her return to office today for removal.    Pt states she does not have pain/numbness in the left hand.  She does have some numbness in the forearm.  Her JP drain has been draining about 40-45cc / day  The pt is on dialysis on T/T/S.  Allergies  Allergen Reactions   Ciprofloxacin  Swelling and Other (See Comments)    Per pt caused lips swell and nauseous feeling   Levaquin [Levofloxacin] Swelling and Other (See Comments)    Per pt caused lips swell and nauseous feeling   Linaclotide Other (See Comments)    Cause severe dehydration   Promethazine  Other (See Comments)    Completely wipes out/fatigue   Buspar [Buspirone] Other (See Comments)    abd cramping   Advair Diskus [Fluticasone -Salmeterol] Other (See Comments)    Thrush    Biaxin [Clarithromycin] Rash   Hydroxyzine Palpitations    Current Outpatient Medications  Medication Sig Dispense Refill   acetaminophen  (TYLENOL ) 500 MG tablet Take 500-1,000 mg by mouth every 6 (six) hours as needed (pain.).     aspirin  EC 81 MG tablet Take 1 tablet (81 mg total) by mouth daily with breakfast. Swallow whole. (Patient taking differently: Take 81 mg by mouth at bedtime. Swallow whole.) 30 tablet 12   Blood Glucose Monitoring Suppl DEVI 1 each by Does not apply route 3 (three) times daily. May dispense any manufacturer covered by patient's insurance. 1 each 0   buPROPion  (WELLBUTRIN  XL) 150 MG 24 hr tablet Take 1 tablet (150 mg total) by mouth daily. 30 tablet 3   carvedilol  (COREG ) 12.5 MG tablet Take 12.5 mg by mouth See admin instructions. Take 12.5 mg twice daily on non-dialysis Sun, Mon, Wed, and Fri     Cholecalciferol  (VITAMIN D -3 PO) Take 1 tablet by mouth in the morning.     clopidogrel  (PLAVIX ) 75 MG tablet Take 1 tablet  (75 mg total) by mouth daily. 30 tablet 5   Continuous Blood Gluc Sensor MISC 1 each by Does not apply route as directed. Use as directed every 14 days. May dispense FreeStyle [employer] or similar.     diclofenac  Sodium (VOLTAREN ) 1 % GEL Apply 2 g topically 4 (four) times daily as needed (pain.).     diphenhydrAMINE  (BENADRYL ) 25 MG tablet Take 50 mg by mouth 2 (two) times daily as needed for allergies or sleep.     DULoxetine  (CYMBALTA ) 30 MG capsule Take 1 capsule (30 mg total) by mouth daily. 30 capsule 3   EPINEPHrine  0.3 mg/0.3 mL IJ SOAJ injection Inject 0.3 mg into the muscle as needed for anaphylaxis.     FOLIC ACID  PO Take 1 capsule by mouth daily.     glucagon (GLUCAGEN  HYPOKIT) 1 MG SOLR injection Inject 1 mg into the skin once as needed for up to 1 dose for low blood sugar. GlucaGen  HypoKit 1 mg Injection 1 each 3   Glucose Blood (BLOOD GLUCOSE TEST STRIPS) STRP 1 each by Does not apply route 3 (three) times daily. Use as directed to check blood sugar. May dispense any manufacturer covered by patient's insurance and fits patient's device. 100 strip 0   hydrALAZINE  (APRESOLINE ) 25 MG tablet Take 3  tablets (75 mg total) by mouth 3 (three) times daily. (Patient taking differently: Take 25 mg by mouth See admin instructions. Take 25 mg twice daily on non-dialysis Sun, Mon, Wed, and Fri) 270 tablet 1   insulin  aspart (NOVOLOG ) 100 UNIT/ML FlexPen If eating and Blood Glucose (BG) 80 or higher inject 0 units for meal coverage and add correction dose per scale. If not eating, correction dose only. BG <150= 0 unit; BG 150-200= 1 unit; BG 201-250= 2 unit; BG 251-300= 3 unit; BG 301-350= 4 unit; BG 351-400= 5 unit; BG >400= 6 unit and Call Primary Care. (When not using pump) 15 mL 0   insulin  degludec (TRESIBA ) 100 UNIT/ML FlexTouch Pen Inject 50 Units into the skin daily. May substitute as needed per insurance. When not using pump. 15 mL 0   Insulin  Pen Needle (PEN NEEDLES) 31G X 5 MM MISC 1  each by Does not apply route 3 (three) times daily. May dispense any manufacturer covered by patient's insurance. 100 each 0   isosorbide  mononitrate (IMDUR ) 30 MG 24 hr tablet Take 1 tablet (30 mg total) by mouth daily. 30 tablet 1   lamoTRIgine  (LAMICTAL ) 200 MG tablet Take 1 tablet (200 mg total) by mouth every evening. 30 tablet 5   Lancet Device MISC 1 each by Does not apply route 3 (three) times daily. May dispense any manufacturer covered by patient's insurance. 1 each 0   Lancets MISC 1 each by Does not apply route 3 (three) times daily. Use as directed to check blood sugar. May dispense any manufacturer covered by patient's insurance and fits patient's device. 100 each 0   levothyroxine  (SYNTHROID ) 175 MCG tablet Take 1.5 tablets by mouth on Sundays in the mornings & take 1 tablet (175 mcg) by mouth on all other days.     Melatonin 10 MG TABS Take 10 mg by mouth at bedtime.     mupirocin  ointment (BACTROBAN ) 2 % Apply 1 Application topically 2 (two) times daily. (Patient taking differently: Apply 1 Application topically 2 (two) times daily as needed (wound care).) 30 g 1   ondansetron  (ZOFRAN -ODT) 4 MG disintegrating tablet Take 4 mg by mouth every 8 (eight) hours as needed for nausea or vomiting.     oxyCODONE -acetaminophen  (PERCOCET/ROXICET) 5-325 MG tablet Take 1 tablet by mouth every 6 (six) hours as needed for severe pain (pain score 7-10). 20 tablet 0   OXYGEN Inhale 3 L into the lungs at bedtime.     PRESCRIPTION MEDICATION See admin instructions.  IDPN LV CUSTOM (100GM)   INFUSE 1 BAG DURING HD 3 TIMES WEEKLY AS FOLLOWS 1ST WEEK 55ML/HR; 2ND WEEK 110ML/HR; 3RD WEEK 163ML/HR; THEN CONTINUE WITH GOAL RATE= 163ML/HR OVER 3HRS . TITRATE AS TOLERATED. ADMINISTER IV VIA VENOUS DRIP CHAMBER.     rizatriptan  (MAXALT ) 10 MG tablet Take 10 mg by mouth as needed for migraine. May repeat in 2 hours if needed     rosuvastatin  (CRESTOR ) 10 MG tablet Take 1 tablet (10 mg total) by mouth  daily. (Patient taking differently: Take 10 mg by mouth every evening.) 90 tablet 3   sevelamer  carbonate (RENVELA ) 800 MG tablet Take 1,600 mg by mouth 3 (three) times daily with meals.     torsemide  (DEMADEX ) 20 MG tablet Take 40 mg by mouth See admin instructions. Take 40 mg daily on non-dialysis Sun, Mon, Wed, and Fri     No current facility-administered medications for this visit.     ROS:  See HPI  Physical Exam:  Today's Vitals   12/19/23 1343  BP: (!) 166/66  Pulse: 70  Temp: 97.9 F (36.6 C)  TempSrc: Temporal  SpO2: 92%  Weight: 266 lb 9.6 oz (120.9 kg)  Height: 5' 9 (1.753 m)  PainSc: 3   PainLoc: Arm   Body mass index is 39.37 kg/m.   Incision:  well healed incisions Extremities:   There is a palpable left radial pulse.   Motor and sensory are in tact.   There is a thrill/bruit present.  Access is  easily palpable Some ecchymosis in the left upper arm    Assessment/Plan:  This is a 53 y.o. female who is s/p: left 2nd stage BVT on 12/16/2023 by Dr. Serene.    -the pt does not have evidence of steal.  She does have a palpable left radial pulse.  There is some numbness along the forearm, most likely related to nerve irritation.   -removed her JP drain today and she tolerated this well.   She does have some fullness around the tunneled site - advised to elevate her arm when she can. -pt's access can be used 01/14/2024.  Will have her return in 2-3 weeks for incision check before using the fistula -if pt has tunneled catheter, this can be removed at the discretion of the dialysis center once the pt's access has been successfully cannulated to their satisfaction.  -discussed with pt that access does not last forever and will need intervention or even new access at some point.     Lucie Apt, [facility] [phone]  Clinic MD:  Serene

## 2023-12-22 ENCOUNTER — Telehealth: Payer: Self-pay

## 2023-12-22 NOTE — Telephone Encounter (Addendum)
 Patient called [facility] Triage line c/o swelling of her left s/p AVF transposition.  Patient sent pictures through her MyChart.  The left arm was loosely wrapped in Ace wrap so the swelling was difficult to see.  Patient resent picture of her arm.  Her left arm and hand did appear to have mild swelling.  Patient advised to elevate her hand above her heart and to use mild compression to reduce swelling.  Patient agreed to call back if the swelling doesn't improve or she develops signs of infection (increased pain, red streaks, warm, fever, puss-like drainage).   Patient verbally agreed.

## 2023-12-26 ENCOUNTER — Other Ambulatory Visit: Payer: Self-pay | Admitting: Pulmonary Disease

## 2023-12-26 ENCOUNTER — Other Ambulatory Visit: Payer: Self-pay | Admitting: Infectious Diseases

## 2023-12-30 ENCOUNTER — Other Ambulatory Visit: Payer: Self-pay | Admitting: Psychiatry

## 2024-01-03 ENCOUNTER — Other Ambulatory Visit: Payer: Self-pay

## 2024-01-09 ENCOUNTER — Ambulatory Visit: Attending: Surgery | Admitting: Physician Assistant

## 2024-01-09 ENCOUNTER — Encounter: Payer: Self-pay | Admitting: Physician Assistant

## 2024-01-09 VITALS — BP 167/73 | HR 60 | Temp 97.9°F | Wt 269.2 lb

## 2024-01-09 DIAGNOSIS — N186 End stage renal disease: Secondary | ICD-10-CM

## 2024-01-09 DIAGNOSIS — Z992 Dependence on renal dialysis: Secondary | ICD-10-CM

## 2024-01-09 MED ORDER — ISOSORBIDE MONONITRATE ER 30 MG PO TB24: 30.0000 mg | ORAL_TABLET | Freq: Every day | ORAL | 3 refills | Status: DC

## 2024-01-09 MED ORDER — HYDRALAZINE HCL 25 MG PO TABS: 75.0000 mg | ORAL_TABLET | Freq: Three times a day (TID) | ORAL | 3 refills | Status: AC

## 2024-01-09 NOTE — Progress Notes (Signed)
    Postoperative Access Visit   History of Present Illness   MEILING HENDRIKS is a 53 y.o. year old female who presents for postoperative follow-up for: left second stage basilic vein transposition (Date: 12/16/23).  The patient's wounds are  healed.  The patient denies steal symptoms.  The patient is able to complete their activities of daily living.  She is currently dialyzing via left IJ [facility] on a Tuesday Thursday Saturday schedule at [facility] in [place]   Physical Examination   Vitals:   01/09/24 0928  BP: (!) 167/73  Pulse: 60  Temp: 97.9 F (36.6 C)  TempSrc: Temporal  Weight: 269 lb 3.2 oz (122.1 kg)   Body mass index is 39.75 kg/m.  left arm Incisions are healed, palpable radial pulse, hand grip is 5/5, sensation in digits is intact, palpable thrill, bruit can be auscultated     Medical Decision Making   JANKI DIKE is a 53 y.o. year old female who presents s/p left second stage basilic vein transposition  Patent brachiobasilic fistula without signs or symptoms of steal syndrome The patient's access will be ready for use 8/12 The patient's tunneled dialysis catheter can be removed when Nephrology is comfortable with the performance of the fistula The patient may follow up on a prn basis   Donnice Sender PA-C [facility] [place] Office: [phone]  Clinic MD: Lanis on call

## 2024-01-13 ENCOUNTER — Other Ambulatory Visit: Payer: Self-pay | Admitting: Medical Genetics

## 2024-01-19 ENCOUNTER — Other Ambulatory Visit: Payer: Self-pay | Admitting: Pulmonary Disease
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
## 2024-01-20 ENCOUNTER — Other Ambulatory Visit (HOSPITAL_COMMUNITY)

## 2024-01-20 NOTE — Telephone Encounter (Signed)
 Please advise as I do not see in med list.

## 2024-01-25 ENCOUNTER — Encounter: Payer: Self-pay | Admitting: Psychiatry

## 2024-01-25 ENCOUNTER — Telehealth (INDEPENDENT_AMBULATORY_CARE_PROVIDER_SITE_OTHER): Admitting: Psychiatry

## 2024-01-25 DIAGNOSIS — F411 Generalized anxiety disorder: Secondary | ICD-10-CM

## 2024-01-25 DIAGNOSIS — F603 Borderline personality disorder: Secondary | ICD-10-CM | POA: Diagnosis not present

## 2024-01-25 DIAGNOSIS — F3181 Bipolar II disorder: Secondary | ICD-10-CM | POA: Diagnosis not present

## 2024-01-25 MED ORDER — LORAZEPAM 0.5 MG PO TABS: 0.5000 mg | ORAL_TABLET | Freq: Every day | ORAL | 1 refills | Status: DC | PRN

## 2024-01-25 MED ORDER — DULOXETINE HCL 30 MG PO CPEP: 30.0000 mg | ORAL_CAPSULE | Freq: Every day | ORAL | 3 refills | Status: DC

## 2024-01-25 MED ORDER — BUPROPION HCL ER (XL) 150 MG PO TB24: 150.0000 mg | ORAL_TABLET | Freq: Every day | ORAL | 5 refills | Status: DC

## 2024-01-25 NOTE — Patient Instructions (Signed)
 Continue lamotrigine  200 mg daily  Continue duloxetine  30 mg daily  Continue bupropion  150 mg daily  Continue lorazepam  0.5 mg daily as needed for anxiety   Next appointment: 10/15 at 11 am

## 2024-02-21 ENCOUNTER — Encounter (HOSPITAL_COMMUNITY): Payer: Self-pay | Admitting: Emergency Medicine

## 2024-02-21 ENCOUNTER — Other Ambulatory Visit: Payer: Self-pay

## 2024-02-21 ENCOUNTER — Emergency Department (HOSPITAL_COMMUNITY)

## 2024-02-21 ENCOUNTER — Emergency Department (HOSPITAL_COMMUNITY)
Admission: EM | Admit: 2024-02-21 | Discharge: 2024-02-21 | Disposition: A | Discharge status: Home / Self Care | Attending: Emergency Medicine | Admitting: Emergency Medicine

## 2024-02-21 DIAGNOSIS — I2510 Atherosclerotic heart disease of native coronary artery without angina pectoris: Secondary | ICD-10-CM | POA: Diagnosis not present

## 2024-02-21 DIAGNOSIS — Z7982 Long term (current) use of aspirin: Secondary | ICD-10-CM | POA: Insufficient documentation

## 2024-02-21 DIAGNOSIS — N186 End stage renal disease: Secondary | ICD-10-CM | POA: Insufficient documentation

## 2024-02-21 DIAGNOSIS — Z79899 Other long term (current) drug therapy: Secondary | ICD-10-CM | POA: Insufficient documentation

## 2024-02-21 DIAGNOSIS — Z7902 Long term (current) use of antithrombotics/antiplatelets: Secondary | ICD-10-CM | POA: Insufficient documentation

## 2024-02-21 DIAGNOSIS — Z794 Long term (current) use of insulin: Secondary | ICD-10-CM | POA: Diagnosis not present

## 2024-02-21 DIAGNOSIS — Z992 Dependence on renal dialysis: Secondary | ICD-10-CM | POA: Diagnosis not present

## 2024-02-21 DIAGNOSIS — I509 Heart failure, unspecified: Secondary | ICD-10-CM | POA: Diagnosis not present

## 2024-02-21 DIAGNOSIS — R079 Chest pain, unspecified: Secondary | ICD-10-CM | POA: Insufficient documentation

## 2024-02-21 DIAGNOSIS — E1122 Type 2 diabetes mellitus with diabetic chronic kidney disease: Secondary | ICD-10-CM | POA: Diagnosis not present

## 2024-02-21 LAB — CBG MONITORING, ED
Glucose-Capillary: 132 mg/dL — ABNORMAL HIGH (ref 70–99)
Glucose-Capillary: 89 mg/dL (ref 70–99)

## 2024-02-21 LAB — CBC
HCT: 30.2 % — ABNORMAL LOW (ref 36.0–46.0)
Hemoglobin: 9.7 g/dL — ABNORMAL LOW (ref 12.0–15.0)
MCH: 31.5 pg (ref 26.0–34.0)
MCHC: 32.1 g/dL (ref 30.0–36.0)
MCV: 98.1 fL (ref 80.0–100.0)
Platelets: 247 K/uL (ref 150–400)
RBC: 3.08 MIL/uL — ABNORMAL LOW (ref 3.87–5.11)
RDW: 13.4 % (ref 11.5–15.5)
WBC: 7.5 K/uL (ref 4.0–10.5)
nRBC: 0 % (ref 0.0–0.2)

## 2024-02-21 LAB — BASIC METABOLIC PANEL WITH GFR
Anion gap: 11 (ref 5–15)
BUN: 45 mg/dL — ABNORMAL HIGH (ref 6–20)
CO2: 24 mmol/L (ref 22–32)
Calcium: 9.1 mg/dL (ref 8.9–10.3)
Chloride: 101 mmol/L (ref 98–111)
Creatinine, Ser: 7.28 mg/dL — ABNORMAL HIGH (ref 0.44–1.00)
GFR, Estimated: 6 mL/min — ABNORMAL LOW (ref >60)
Glucose, Bld: 113 mg/dL — ABNORMAL HIGH (ref 70–99)
Potassium: 4.1 mmol/L (ref 3.5–5.1)
Sodium: 136 mmol/L (ref 135–145)

## 2024-02-21 LAB — TROPONIN I (HIGH SENSITIVITY)
Troponin I (High Sensitivity): 32 ng/L — ABNORMAL HIGH (ref <18)
Troponin I (High Sensitivity): 32 ng/L — ABNORMAL HIGH (ref <18)

## 2024-02-21 NOTE — ED Notes (Signed)
 This nurse called to room, Patient stated I feels like my sugar has dropped. This nurse checked CBG got 89. Patient then informs nurse that her Dexcom was reading 79. MD made aware.

## 2024-02-21 NOTE — ED Notes (Signed)
 ED Provider at bedside.

## 2024-02-21 NOTE — ED Triage Notes (Signed)
 Pt in by [facility] for chest pain that started 0600 this morning. Pt took 324mg  asa and 1 nitroglycerin  w/ ems. Pt did have relief w/ the nitroglycerin .

## 2024-02-21 NOTE — Discharge Instructions (Signed)
 Your workup today was reassuring.  Please call your dialysis center tomorrow to arrange to have your dialysis treatment.  Someone from your cardiology office will contact you to arrange follow-up appointment.  Please call their office if you do not hear from them in the next 1 to 2 days.  Return to the emergency department for any new or worsening symptoms.

## 2024-02-21 NOTE — ED Provider Notes (Signed)
 [place] EMERGENCY DEPARTMENT AT [facility] Provider Note   CSN: [id number] Arrival date & time: 02/21/24  9041     Patient presents with: Chest Pain   Jasmine [name] is a 53 y.o. female.    Chest Pain Associated symptoms: no cough, no fever and no shortness of breath        Jasmine [name] is a 53 y.o. female past medical history of chronic migraines, GERD, type 2 diabetes,, CHF, coronary artery disease, end-stage renal disease on hemodialysis Tuesday Thursday Saturday, who presents to the Emergency Department complaining of chest pain upon waking this morning at 6 AM.  She states the pain was mild but gradually worsened.  Pain was associated with diaphoresis.  She was attempting to go to her dialysis appointment this morning when the pain began.  Pain was left-sided.  She called EMS and was given 1 aspirin  and 1 sublingual nitroglycerin  with significant relief.  She describes minimal chest pain at this time.  She denies Shortness of breath,  neck jaw or arm pain.  No nausea or vomiting.  She is followed by cardiology in [place]  Prior to Admission medications   Medication Sig Start Date End Date Taking? Authorizing Provider  acetaminophen  (TYLENOL ) 500 MG tablet Take 500-1,000 mg by mouth every 6 (six) hours as needed (pain.).   Yes [provider]  albuterol  (VENTOLIN  HFA) 108 (90 Base) MCG/ACT inhaler Inhale 1-2 puffs into the lungs every 6 (six) hours as needed for wheezing or shortness of breath. 01/22/24  Yes Jude Harden GAILS, MD  aspirin  EC 81 MG tablet Take 1 tablet (81 mg total) by mouth daily with breakfast. Swallow whole. Patient taking differently: Take 81 mg by mouth at bedtime. Swallow whole. 09/09/23  Yes Emokpae, Courage, MD  buPROPion  (WELLBUTRIN  XL) 150 MG 24 hr tablet Take 1 tablet (150 mg total) by mouth daily. 02/25/24 08/23/24 Yes Hisada, Katheren, MD  carvedilol  (COREG ) 12.5 MG tablet Take 12.5 mg by mouth 2 (two) times daily.   Yes [provider]  Cholecalciferol  (VITAMIN D -3 PO) Take 1 tablet by mouth in the morning.   Yes [provider]  clopidogrel  (PLAVIX ) 75 MG tablet Take 1 tablet (75 mg total) by mouth daily. 02/22/23  Yes Vannie Reche RAMAN, NP  diclofenac  Sodium (VOLTAREN ) 1 % GEL Apply 2 g topically 4 (four) times daily as needed (pain.).   Yes [provider]  diphenhydrAMINE  (BENADRYL ) 25 MG tablet Take 50 mg by mouth 2 (two) times daily as needed for allergies or sleep.   Yes [provider]  DULoxetine  (CYMBALTA ) 30 MG capsule Take 1 capsule (30 mg total) by mouth daily. 02/08/24 06/07/24 Yes Vickey Katheren, MD  EPINEPHrine  0.3 mg/0.3 mL IJ SOAJ injection Inject 0.3 mg into the muscle as needed for anaphylaxis. 12/10/23  Yes [provider]  fluticasone  (FLONASE ) 50 MCG/ACT nasal spray Place 2 sprays into both nostrils daily.   Yes [provider]  FOLIC ACID  PO Take 1 capsule by mouth daily.   Yes [provider]  glucagon (GLUCAGEN  HYPOKIT) 1 MG SOLR injection Inject 1 mg into the skin once as needed for up to 1 dose for low blood sugar. GlucaGen  HypoKit 1 mg Injection 03/31/23  Yes Johnson, Clanford L, MD  hydrALAZINE  (APRESOLINE ) 25 MG tablet Take 3 tablets (75 mg total) by mouth 3 (three) times daily. 01/09/24  Yes Thukkani, Arun K, MD  insulin  aspart (NOVOLOG ) 100 UNIT/ML FlexPen If eating and Blood  Glucose (BG) 80 or higher inject 0 units for meal coverage and add correction dose per scale. If not eating, correction dose only. BG <150= 0 unit; BG 150-200= 1 unit; BG 201-250= 2 unit; BG 251-300= 3 unit; BG 301-350= 4 unit; BG 351-400= 5 unit; BG >400= 6 unit and Call Primary Care. (When not using pump) 11/03/23  Yes Maree, Pratik D, DO  insulin  degludec (TRESIBA ) 100 UNIT/ML FlexTouch Pen Inject 50 Units into the skin daily. May substitute as needed per insurance. When not using pump. 11/03/23  Yes Shah, Pratik D, DO  isosorbide  mononitrate (IMDUR ) 30 MG 24 hr tablet  Take 1 tablet (30 mg total) by mouth daily. 01/09/24  Yes Thukkani, Arun K, MD  lamoTRIgine  (LAMICTAL ) 200 MG tablet Take 1 tablet (200 mg total) by mouth every evening. 10/21/23 04/18/24 Yes Vickey Mettle, MD  levothyroxine  (SYNTHROID ) 175 MCG tablet Take 175 mcg by mouth daily before breakfast. 11/20/21  Yes [provider]  lidocaine -prilocaine (EMLA) cream Apply topically 3 (three) times a week. 02/20/24  Yes [provider]  LORazepam  (ATIVAN ) 0.5 MG tablet Take 1 tablet (0.5 mg total) by mouth daily as needed for anxiety. Patient taking differently: Take 0.5 mg by mouth daily as needed for anxiety or sleep. 01/29/24 03/29/24 Yes Vickey Mettle, MD  Melatonin 10 MG TABS Take 10 mg by mouth at bedtime.   Yes [provider]  ondansetron  (ZOFRAN -ODT) 4 MG disintegrating tablet Take 4 mg by mouth every 8 (eight) hours as needed for nausea or vomiting. 03/17/22  Yes [provider]  oxyCODONE -acetaminophen  (PERCOCET/ROXICET) 5-325 MG tablet 1 tablet every 8 (eight) hours as needed for moderate pain (pain score 4-6). For 15 days 01/30/24  Yes [provider]  OXYGEN Inhale 3 L into the lungs at bedtime.   Yes [provider]  PRESCRIPTION MEDICATION See admin instructions.  IDPN LV CUSTOM (100GM)   INFUSE 1 BAG DURING HD 3 TIMES WEEKLY AS FOLLOWS 1ST WEEK 55ML/HR; 2ND WEEK 110ML/HR; 3RD WEEK 163ML/HR; THEN CONTINUE WITH GOAL RATE= 163ML/HR OVER 3HRS . TITRATE AS TOLERATED. ADMINISTER IV VIA VENOUS DRIP CHAMBER.   Yes [provider]  rizatriptan  (MAXALT ) 10 MG tablet Take 10 mg by mouth as needed for migraine. May repeat in 2 hours if needed   Yes [provider]  rosuvastatin  (CRESTOR ) 10 MG tablet Take 1 tablet (10 mg total) by mouth daily. Patient taking differently: Take 10 mg by mouth every evening. 07/29/23 02/21/24 Yes Lonni Slain, MD  sevelamer  carbonate (RENVELA ) 800 MG tablet Take 1,600 mg by mouth 3  (three) times daily with meals. 07/28/23  Yes [provider]  torsemide  (DEMADEX ) 20 MG tablet Take 40 mg by mouth See admin instructions. Take 40 mg daily on non-dialysis Sun, Mon, Wed, and Fri   Yes [provider]  Blood Glucose Monitoring Suppl DEVI 1 each by Does not apply route 3 (three) times daily. May dispense any manufacturer covered by patient's insurance. 11/03/23   Maree, Pratik D, DO  Continuous Blood Gluc Sensor MISC 1 each by Does not apply route as directed. Use as directed every 14 days. May dispense FreeStyle [employer] or similar.    [provider]  Glucose Blood (BLOOD GLUCOSE TEST STRIPS) STRP 1 each by Does not apply route 3 (three) times daily. Use as directed to check blood sugar. May dispense any manufacturer covered by patient's insurance and fits patient's device. 11/03/23   Maree Bracken D, DO  Insulin   Pen Needle (PEN NEEDLES) 31G X 5 MM MISC 1 each by Does not apply route 3 (three) times daily. May dispense any manufacturer covered by patient's insurance. 11/03/23   Maree Bracken D, DO  Lancet Device MISC 1 each by Does not apply route 3 (three) times daily. May dispense any manufacturer covered by patient's insurance. 11/03/23   Maree Bracken D, DO  Lancets MISC 1 each by Does not apply route 3 (three) times daily. Use as directed to check blood sugar. May dispense any manufacturer covered by patient's insurance and fits patient's device. 11/03/23   Maree, Pratik D, DO  predniSONE (DELTASONE) 10 MG tablet Take  40 mg by mouth  (4 tablets)x 2 days, them  20 mg (2  tablets) x 2 days then 10 mg ( one tablet) x 2 days Patient not taking: Reported on 02/21/2024 12/13/23   [provider]  predniSONE (DELTASONE) 20 MG tablet 20 mg. Patient not taking: Reported on 02/21/2024 12/26/23   [provider]    Allergies: Ciprofloxacin , Levaquin [levofloxacin], Linaclotide, Promethazine , Buspar [buspirone], Advair diskus [fluticasone -salmeterol],  Biaxin [clarithromycin], and Hydroxyzine    Review of Systems  Constitutional:  Negative for chills and fever.  Respiratory:  Negative for cough and shortness of breath.   Cardiovascular:  Positive for chest pain.    Updated Vital Signs BP 120/60 (BP Location: Right Arm)   Pulse 60   Temp 98.7 F (37.1 C) (Oral)   Resp 17   Ht 5' 9 (1.753 m)   Wt 122 kg   LMP 03/23/2017 (Approximate)   SpO2 97%   BMI 39.72 kg/m   Physical Exam Vitals and nursing note reviewed.  Constitutional:      General: She is not in acute distress.    Appearance: She is not ill-appearing or toxic-appearing.  Cardiovascular:     Rate and Rhythm: Normal rate and regular rhythm.     Pulses: Normal pulses.  Pulmonary:     Effort: Pulmonary effort is normal.     Breath sounds: Normal breath sounds.  Chest:     Chest wall: No tenderness.  Abdominal:     Palpations: Abdomen is soft.     Tenderness: There is no abdominal tenderness.  Musculoskeletal:        General: Normal range of motion.     Cervical back: No tenderness.     Right lower leg: No edema.     Left lower leg: No edema.  Skin:    General: Skin is warm.     Capillary Refill: Capillary refill takes less than 2 seconds.  Neurological:     General: No focal deficit present.     Mental Status: She is alert.     Sensory: No sensory deficit.     Motor: No weakness.     (all labs ordered are listed, but only abnormal results are displayed) Labs Reviewed  CBG MONITORING, ED - Abnormal; Notable for the following components:      Result Value   Glucose-Capillary 132 (*)    All other components within normal limits  BASIC METABOLIC PANEL WITH GFR  CBC  TROPONIN I (HIGH SENSITIVITY)    EKG: EKG Interpretation Date/Time:  Tuesday February 21 2024 10:16:41 EDT Ventricular Rate:  61 PR Interval:  156 QRS Duration:  100 QT Interval:  444 QTC Calculation: 446 R Axis:   -17  Text Interpretation: Normal sinus rhythm Septal infarct ,  age undetermined Abnormal ECG When compared with ECG of 02-Dec-2023 20:16,  PREVIOUS ECG IS PRESENT No significant change since last tracing Confirmed by Dean Clarity [phone]) on 02/21/2024 10:45:14 AM  Radiology: No results found.   Procedures   Medications Ordered in the ED - No data to display                                  Medical Decision Making Pt here with mild chest pain prior to going to dialysis.  Hx of cardiac disease and followed by cardiology.  Chest pain minimal upon arrival and improved after given SL NTG PTA.     Diff dx ACS, PE, PNA, MSK, pericarditis.doubt dissection   Vitals reassuring, no tachycardia, tachypnea or report of dyspnea to suggest PE   Amount and/or Complexity of Data Reviewed Labs: ordered.    Details: Labs c/w ESRD.  She did not have her dialysis today as scheduled.  Troponins are flat.   Radiology: ordered.    Details: Chest XR similar to previous with suggestion of mild pulm edema ECG/medicine tests: ordered.    Details: EKG NSR.  Septal infarct.  No significant change from previous Discussion of management or test interpretation with external provider(s): On recheck, pt resting comfortably.  Chest pain has continue to improve since onset.  No dyspnea.  Work up w/o suggestion of CHF exacerbation  Work up reassuring, but given cardiac hx, I will provide ambul referral back to her cardiologist for recheck.  She was given strict return precautions.          Final diagnoses:  Nonspecific chest pain    ED Discharge Orders     None          Herlinda Milling, PA-C 02/25/24 0950    Dean Clarity, MD 02/25/24 1555

## 2024-03-02 NOTE — Progress Notes (Signed)
 Cardiology Office Note:   Date:  03/07/2024  ID:  Jasmine [name], DOB 1970/08/07, MRN [id number] PCP:  Debrah Josette ORN., PA-C  [facility] Providers Cardiologist:  Wendel Haws, MD Referring MD: Debrah Josette ORN., PA-C  Chief Complaint/Reason for Referral: Management of coronary artery disease ASSESSMENT:    1. Coronary artery disease involving native coronary artery of native heart without angina pectoris   2. 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 Hypertension associated with diabetes (HCC)   4. Hyperlipidemia associated with type 2 diabetes mellitus (HCC)   5. Aortic atherosclerosis   6. ESRD (end stage renal disease) on dialysis (HCC)   7. BMI 38.0-38.9,adult   8. Palpitations      PLAN:   In order of problems listed above: Coronary artery disease: Continue aspirin  81 and Plavix  75 until December the then stop aspirin  and continue Plavix  monotherapy indefinitely.  Continue rosuvastatin  10 mg start as needed nitroglycerin .  Has occasional anginal symptoms.  Patient will monitor these if worsening will perhaps need to address with repeat cardiac catheterization.  The patient tells me that she has a stress component regards to her chest pain as well. Type 1 diabetes mellitus: Continue aspirin  81 mg daily, Crestor  10 mg daily.  Not a candidate for SGLT2 inhibitor given end-stage renal disease and type 1 diabetes. Hypertension: BP somewhat low today.  Change Coreg  12.5 mg twice daily to Toprol -XL 12.5 mg at bedtime and change Imdur  30 mg to nighttime dosing.  Continue hydralazine  25 mg 3 times daily. Hyperlipidemia: Continue Crestor  10 mg daily.  Check lipids, LFTs, and LP(a) today. Aortic atherosclerosis: Continue aspirin  81 mg and Crestor  10 mg.   End-stage renal disease: Continue hemodialysis Obstructive sleep apnea: Continue CPAP. Elevated BMI: Not a candidate for GLP-1 receptor agonist therapy given type 1  diabetes. Palpitations: Will obtain monitor to evaluate further            Dispo:  Return in about 6 months (around 09/05/2024).      Medication Adjustments/Labs and Tests Ordered: Current medicines are reviewed at length with the patient today.  Concerns regarding medicines are outlined above.  The following changes have been made:  no change   Labs/tests ordered: No orders of the defined types were placed in this encounter.   Medication Changes: No orders of the defined types were placed in this encounter.   Current medicines are reviewed at length with the patient today.  The patient does not have concerns regarding medicines.  I spent 34 minutes reviewing all clinical data during and prior to this visit including all relevant imaging studies, laboratories, clinical information from other health systems and prior notes from both Cardiology and other specialties, interviewing the patient, conducting a complete physical examination, and coordinating care in order to formulate a comprehensive and personalized evaluation and treatment plan.   History of Present Illness:      FOCUSED PROBLEM LIST:   High risk PET 2025 Cath MV CAD Poor surgical candidate >> PCI RCA, D1, mid to distal LAD March 2025 T1DM ESRD On HD Being considered for transplantation Hyperlipidemia LP(a) 15.5 Aortic atherosclerosis PET stress test 2025 BMI 38 OSA On CPAP  March 2025:  Patient consents to use of AI scribe. The patient is here to discuss management of her coronary artery disease.  I performed cardiac catheterization on the patient in February due to a high risk stress test.  This demonstrated LAD, diagonal, and right coronary  artery disease.  She was seen by cardiac surgery and given her recent foot fracture was thought to be not an ideal candidate for surgical revascularization.  She was presented at the heart team meeting and consensus opinion given these issues was that PCI should be  pursued.  She is here to discuss this.  No recent chest pain since her last visit. Breathing is stable, with no significant shortness of breath, only experiencing allergy-related issues. She is currently on Plavix , which was started last year when she began dialysis.  She is in the preparatory phase for peritoneal dialysis, with surgery scheduled for catheter placement. She currently undergoes dialysis on Tuesdays, Thursdays, and Saturdays.  No recent lightheadedness or blacking out, although she occasionally feels lightheaded upon standing, which she attributes to orthostatic changes.  She uses a wheelchair due to fatigue and difficulty walking long distances, attributed to knee and back issues. She reports that she 'gives out easy' due to these problems.  Plan: Refer for multivessel PCI.  October 2025:  Patient consents to use of AI scribe. The patient returns for routine follow-up.  She underwent multivessel PCI without complications in March.  She was admitted in April for bacteremia.  She underwent a TEE which demonstrated no endocarditis.  She was treated for MRSA due to an abscess in her right thigh.  She presented later in May with DKA.  Her troponins were mildly elevated.  She was dialyzed and discharged home.  Never started on peritoneal HD, managed with conventional dialysis in the interim as well with a left brachiobasilic fistula placed in May.  She was seen in June in the cardiology clinic and was doing well.  She experiences intermittent chest pain, described as a pressure, that occurs occasionally with exertion, particularly when walking for extended periods. The pain resolves upon sitting down and does not occur during dialysis sessions. A recent emergency room visit for chest pain was unremarkable.  She experiences palpitations every other day, with episodes occurring three to four times a week. She notes having palpitations today and occasionally experiences shortness of breath,  particularly in the mornings, which resolves after dialysis. She mentions having panic attacks and anxiety attacks, which sometimes contribute to chest pain.  She had a new fistula placed in May, which developed a hematoma and was recently ballooned open due to narrowing.  She is currently on dialysis on Tuesday, Thursday, and Saturday. She reports occasional low blood pressure towards the end of dialysis sessions but has not had to stop due to low blood pressure. Her current medications include Imdur  taken in the morning, hydralazine  taken twice a day instead of three times, aspirin , and Plavix . She drives herself to dialysis and has not used a wheelchair or walker until today due to shortness of breath.          Current Medications: Current Meds  Medication Sig   acetaminophen  (TYLENOL ) 500 MG tablet Take 500-1,000 mg by mouth every 6 (six) hours as needed (pain.).   albuterol  (VENTOLIN  HFA) 108 (90 Base) MCG/ACT inhaler Inhale 1-2 puffs into the lungs every 6 (six) hours as needed for wheezing or shortness of breath.   aspirin  EC 81 MG tablet Take 1 tablet (81 mg total) by mouth daily with breakfast. Swallow whole.   Blood Glucose Monitoring Suppl DEVI 1 each by Does not apply route 3 (three) times daily. May dispense any manufacturer covered by patient's insurance.   buPROPion  (WELLBUTRIN  XL) 150 MG 24 hr tablet Take 1 tablet (150  mg total) by mouth daily.   carvedilol  (COREG ) 12.5 MG tablet Take 12.5 mg by mouth 2 (two) times daily. (Patient taking differently: Take 12.5 mg by mouth. Taking Sun., Mon., Wed., Fri.)   Cholecalciferol  (VITAMIN D -3 PO) Take 1 tablet by mouth in the morning.   clopidogrel  (PLAVIX ) 75 MG tablet Take 1 tablet (75 mg total) by mouth daily.   Continuous Blood Gluc Sensor MISC 1 each by Does not apply route as directed. Use as directed every 14 days. May dispense FreeStyle [employer] or similar.   diclofenac  Sodium (VOLTAREN ) 1 % GEL Apply 2 g topically 4  (four) times daily as needed (pain.).   diphenhydrAMINE  (BENADRYL ) 25 MG tablet Take 50 mg by mouth 2 (two) times daily as needed for allergies or sleep.   DULoxetine  (CYMBALTA ) 30 MG capsule Take 1 capsule (30 mg total) by mouth daily.   EPINEPHrine  0.3 mg/0.3 mL IJ SOAJ injection Inject 0.3 mg into the muscle as needed for anaphylaxis.   fluticasone  (FLONASE ) 50 MCG/ACT nasal spray Place 2 sprays into both nostrils daily.   FOLIC ACID  PO Take 1 capsule by mouth daily.   glucagon (GLUCAGEN  HYPOKIT) 1 MG SOLR injection Inject 1 mg into the skin once as needed for up to 1 dose for low blood sugar. GlucaGen  HypoKit 1 mg Injection   Glucose Blood (BLOOD GLUCOSE TEST STRIPS) STRP 1 each by Does not apply route 3 (three) times daily. Use as directed to check blood sugar. May dispense any manufacturer covered by patient's insurance and fits patient's device.   hydrALAZINE  (APRESOLINE ) 25 MG tablet Take 3 tablets (75 mg total) by mouth 3 (three) times daily. (Patient taking differently: Take 75 mg by mouth 3 (three) times daily. On Sun., Mon., Wed., Fri.)   insulin  aspart (NOVOLOG ) 100 UNIT/ML FlexPen If eating and Blood Glucose (BG) 80 or higher inject 0 units for meal coverage and add correction dose per scale. If not eating, correction dose only. BG <150= 0 unit; BG 150-200= 1 unit; BG 201-250= 2 unit; BG 251-300= 3 unit; BG 301-350= 4 unit; BG 351-400= 5 unit; BG >400= 6 unit and Call Primary Care. (When not using pump)   insulin  degludec (TRESIBA ) 100 UNIT/ML FlexTouch Pen Inject 50 Units into the skin daily. May substitute as needed per insurance. When not using pump.   Insulin  Pen Needle (PEN NEEDLES) 31G X 5 MM MISC 1 each by Does not apply route 3 (three) times daily. May dispense any manufacturer covered by patient's insurance.   isosorbide  mononitrate (IMDUR ) 30 MG 24 hr tablet Take 1 tablet (30 mg total) by mouth daily.   lamoTRIgine  (LAMICTAL ) 200 MG tablet Take 1 tablet (200 mg total) by mouth  every evening.   Lancet Device MISC 1 each by Does not apply route 3 (three) times daily. May dispense any manufacturer covered by patient's insurance.   Lancets MISC 1 each by Does not apply route 3 (three) times daily. Use as directed to check blood sugar. May dispense any manufacturer covered by patient's insurance and fits patient's device.   levothyroxine  (SYNTHROID ) 175 MCG tablet Take 175 mcg by mouth daily before breakfast.   lidocaine -prilocaine (EMLA) cream Apply topically 3 (three) times a week.   LORazepam  (ATIVAN ) 0.5 MG tablet Take 1 tablet (0.5 mg total) by mouth daily as needed for anxiety.   Melatonin 10 MG TABS Take 10 mg by mouth at bedtime.   ondansetron  (ZOFRAN -ODT) 4 MG disintegrating tablet Take 4 mg by mouth  every 8 (eight) hours as needed for nausea or vomiting.   oxyCODONE -acetaminophen  (PERCOCET/ROXICET) 5-325 MG tablet 1 tablet every 8 (eight) hours as needed for moderate pain (pain score 4-6). For 15 days   OXYGEN Inhale 3 L into the lungs at bedtime.   predniSONE (DELTASONE) 10 MG tablet Take  40 mg by mouth  (4 tablets)x 2 days, them  20 mg (2  tablets) x 2 days then 10 mg ( one tablet) x 2 days   predniSONE (DELTASONE) 20 MG tablet 20 mg.   PRESCRIPTION MEDICATION See admin instructions.  IDPN LV CUSTOM (100GM)   INFUSE 1 BAG DURING HD 3 TIMES WEEKLY AS FOLLOWS 1ST WEEK 55ML/HR; 2ND WEEK 110ML/HR; 3RD WEEK 163ML/HR; THEN CONTINUE WITH GOAL RATE= 163ML/HR OVER 3HRS . TITRATE AS TOLERATED. ADMINISTER IV VIA VENOUS DRIP CHAMBER.   rizatriptan  (MAXALT ) 10 MG tablet Take 10 mg by mouth as needed for migraine. May repeat in 2 hours if needed   rosuvastatin  (CRESTOR ) 10 MG tablet Take 1 tablet (10 mg total) by mouth daily. (Patient taking differently: Take 10 mg by mouth every evening.)   sevelamer  carbonate (RENVELA ) 800 MG tablet Take 1,600 mg by mouth 3 (three) times daily with meals.   torsemide  (DEMADEX ) 20 MG tablet Take 40 mg by mouth See admin  instructions. Take 40 mg daily on non-dialysis Sun, Mon, Wed, and Fri     Review of Systems:   Please see the history of present illness.    All other systems reviewed and are negative.     EKGs/Labs/Other Test Reviewed:   EKG: EKG June 2025 normal sinus rhythm with septal infarction pattern  EKG Interpretation Date/Time:    Ventricular Rate:    PR Interval:    QRS Duration:    QT Interval:    QTC Calculation:   R Axis:      Text Interpretation:           Risk Assessment/Calculations:          Physical Exam:   VS:  BP (!) 98/46   Pulse 65   Ht 5' 9 (1.753 m)   Wt 260 lb 6.4 oz (118.1 kg)   LMP 03/23/2017 (Approximate)   SpO2 97%   BMI 38.45 kg/m        Wt Readings from Last 3 Encounters:  03/07/24 260 lb 6.4 oz (118.1 kg)  02/21/24 268 lb 15.4 oz (122 kg)  01/09/24 269 lb 3.2 oz (122.1 kg)      GENERAL:  No apparent distress, AOx3 HEENT:  No carotid bruits, +2 carotid impulses, no scleral icterus CAR: RRR no murmurs, gallops, rubs, or thrills RES:  Clear to auscultation bilaterally ABD:  Soft, nontender, nondistended, positive bowel sounds x 4 VASC:  +2 radial pulses, +2 carotid pulses NEURO:  CN 2-12 grossly intact; motor and sensory grossly intact PSYCH:  No active depression or anxiety EXT:  No edema, ecchymosis, or cyanosis  Signed, Oneka Parada K Alayha Babineaux, MD  03/07/2024 9:10 AM    [facility] [address] [place], [place], [place]  [address] Phone: [phone]; Fax: [phone]   Note:  This document was prepared using Dragon voice recognition software and may include unintentional dictation errors.

## 2024-03-05 ENCOUNTER — Other Ambulatory Visit: Payer: Self-pay

## 2024-03-05 ENCOUNTER — Ambulatory Visit (HOSPITAL_COMMUNITY)
Admission: RE | Admit: 2024-03-05 | Discharge: 2024-03-05 | Disposition: A | Discharge status: Home / Self Care | Attending: Vascular Surgery | Admitting: Vascular Surgery

## 2024-03-05 ENCOUNTER — Encounter (HOSPITAL_COMMUNITY)
Admission: RE | Disposition: A | Discharge status: Home / Self Care | Payer: Self-pay | Source: Home / Self Care | Attending: Vascular Surgery

## 2024-03-05 ENCOUNTER — Other Ambulatory Visit: Payer: Self-pay | Admitting: Psychiatry

## 2024-03-05 DIAGNOSIS — Z992 Dependence on renal dialysis: Secondary | ICD-10-CM | POA: Diagnosis not present

## 2024-03-05 DIAGNOSIS — T82858A Stenosis of vascular prosthetic devices, implants and grafts, initial encounter: Secondary | ICD-10-CM | POA: Insufficient documentation

## 2024-03-05 DIAGNOSIS — Z87891 Personal history of nicotine dependence: Secondary | ICD-10-CM | POA: Diagnosis not present

## 2024-03-05 DIAGNOSIS — I132 Hypertensive heart and chronic kidney disease with heart failure and with stage 5 chronic kidney disease, or end stage renal disease: Secondary | ICD-10-CM | POA: Diagnosis not present

## 2024-03-05 DIAGNOSIS — Y832 Surgical operation with anastomosis, bypass or graft as the cause of abnormal reaction of the patient, or of later complication, without mention of misadventure at the time of the procedure: Secondary | ICD-10-CM | POA: Diagnosis not present

## 2024-03-05 DIAGNOSIS — I509 Heart failure, unspecified: Secondary | ICD-10-CM | POA: Insufficient documentation

## 2024-03-05 DIAGNOSIS — E1022 Type 1 diabetes mellitus with diabetic chronic kidney disease: Secondary | ICD-10-CM | POA: Insufficient documentation

## 2024-03-05 DIAGNOSIS — N186 End stage renal disease: Secondary | ICD-10-CM | POA: Insufficient documentation

## 2024-03-05 HISTORY — PX: A/V FISTULAGRAM: CATH118298

## 2024-03-05 HISTORY — PX: A/V SHUNT INTERVENTION: CATH118220

## 2024-03-05 HISTORY — PX: VENOUS ANGIOPLASTY: CATH118376

## 2024-03-05 SURGERY — A/V FISTULAGRAM
Anesthesia: LOCAL | Site: Arm Upper

## 2024-03-05 MED ORDER — IODIXANOL 320 MG/ML IV SOLN
INTRAVENOUS | Status: DC | PRN
  Administered 2024-03-05: 25 mL via INTRAVENOUS

## 2024-03-05 MED ORDER — LIDOCAINE HCL (PF) 1 % IJ SOLN
INTRAMUSCULAR | Status: DC | PRN
  Administered 2024-03-05: 2 mL via SUBCUTANEOUS

## 2024-03-05 MED ORDER — HEPARIN (PORCINE) IN NACL 1000-0.9 UT/500ML-% IV SOLN
INTRAVENOUS | Status: DC | PRN
  Administered 2024-03-05: 500 mL

## 2024-03-05 MED ORDER — LIDOCAINE HCL (PF) 1 % IJ SOLN
INTRAMUSCULAR | Status: AC
  Filled 2024-03-05: qty 30

## 2024-03-05 SURGICAL SUPPLY — 10 items
BALLOON MUSTANG 7X60X75 (BALLOONS) IMPLANT
KIT ENCORE 26 ADVANTAGE (KITS) IMPLANT
KIT MICROPUNCTURE NIT STIFF (SHEATH) IMPLANT
KIT PV (KITS) ×3 IMPLANT
MAT PREVALON FULL STRYKER (MISCELLANEOUS) IMPLANT
SHEATH PINNACLE R/O II 6F 4CM (SHEATH) IMPLANT
SHEATH PROBE COVER 6X72 (BAG) IMPLANT
TRAY PV CATH (CUSTOM PROCEDURE TRAY) ×3 IMPLANT
TUBING CIL FLEX 10 FLL-RA (TUBING) IMPLANT
WIRE BENTSON .035X145CM (WIRE) IMPLANT

## 2024-03-05 NOTE — Op Note (Signed)
 DATE OF SERVICE: 03/05/2024  PATIENT:  Jasmine [name]  53 y.o. female  PRE-OPERATIVE DIAGNOSIS:  end-stage renal disease; left arm AVF infiltration  POST-OPERATIVE DIAGNOSIS:  Same  PROCEDURE:   1) Ultrasound guided left arm AVF access (CPT [phone]) 2) Fistulagram with peripheral angioplasty - 7x20mm Mustang (CPT [phone]) 3) established outpatient evaluation and management - level 3 (CPT 99213)  SURGEON:  Debby LOISE. Magda, MD  ASSISTANT: none  ANESTHESIA:   local  ESTIMATED BLOOD LOSS: min  LOCAL MEDICATIONS USED:  LIDOCAINE    COUNTS: confirmed correct.  PATIENT DISPOSITION:  PACU - hemodynamically stable.   Delay start of Pharmacological VTE agent (>24hrs) due to surgical blood loss or risk of bleeding: no  INDICATION FOR PROCEDURE: Jasmine [name] is a 53 y.o. female with ESRD on HD. She recently suffered infiltration of her brachiobasilic AVF. After careful discussion of risks, benefits, and alternatives the patient was offered fistulagram. The patient understood and wished to proceed.  OPERATIVE FINDINGS:  Left Upper Extremity Central venous: TDC in place. Mild stenosis about the catheter Subclavian vein: no stenosis Axillary vein: no stenosis Fistula: some stenosis in the mid fistula (70%); no other stenosis Anastomosis: no stenosis  DESCRIPTION OF PROCEDURE: After identification of the patient in the pre-operative holding area, the patient was transferred to the operating room. The patient was positioned supine on the operating room table.  The left upper extremity was prepped and draped in standard fashion. A surgical pause was performed confirming correct patient, procedure, and operative location.  The left upper extremity was anesthetized with subcutaneous injection of 1% lidocaine  over the area of planned access. Using ultrasound guidance, the left arm dialysis access was accessed with micropuncture technique.  Fistulogram was performed in stations with the micro  sheath.  See above for details.  The decision was made to intervene. The lesion was crosse with a Donnel wire. Access was upsized to 63F. Angioplasty was performed of the lesion with a 7x21mm Mustang balloon. Good result was achieved.  All endovascular equipment was removed.  A figure-of-eight stitch was applied to the exit site with good hemostasis.  Sterile bandage was applied.  Upon completion of the case instrument and sharps counts were confirmed correct. The patient was transferred to the PACU in good condition. I was present for all portions of the procedure.  PLAN: Fistula cleared for use as soon as patient and dialysis staff feel comfortable using it. Remove TDC once fistula is reliably being cannulated. Fistula remains amenable to percutaneous intervention.  Debby LOISE. Magda, MD [facility] Phone Number: [phone] 03/05/2024 8:25 AM

## 2024-03-05 NOTE — H&P (Signed)
 [facility] [place]  ASSESSMENT / PLAN: 53 y.o. female with ESRD on HD via TDC. She has a transposed LUE brachiobasilic fistula that has been infiltrated. Plan fistulagram today to evaluate.  CHIEF COMPLAINT: ESRD on HD  HISTORY OF PRESENT ILLNESS: Jasmine [name] is a 53 y.o. female with end-stage renal disease on dialysis through a tunneled dialysis catheter.  She recently had a basilic vein transposition in July.  She has healed nicely from this.  She did suffer an infiltration event during cannulation with the fistula about a week or 2 ago.  Fistulogram is requested to evaluate the fistula.  Past Medical History:  Diagnosis Date   Anemia    Anxiety    Arthritis    Asthma    Balance problems    Bipolar disorder (HCC)    Charcot ankle    CHF (congestive heart failure) (HCC)    Chronic fatigue    Coronary artery disease    Depression    DKA, type 1 (HCC) 11/04/2011   Elevated cholesterol    ESRD on hemodialysis (HCC)    TTS at [facility]   Fibromyalgia    GERD (gastroesophageal reflux disease)    Headache    History of suicidal ideation    Hyperlipemia    Hypertension    Hypothyroidism    IBS (irritable bowel syndrome)    Memory changes    Obesity    Pneumonia    x several   Sleep apnea    uses oxygen 3L at night with cpap nightly   Stress incontinence    Pt had surgery to correct this.   Tobacco abuse    Tremor     Past Surgical History:  Procedure Laterality Date   AV FISTULA PLACEMENT Left 10/14/2023   Procedure: BRACHIOBASILIC ARTERIOVENOUS (AV) FISTULA CREATION LEFT;  Surgeon: Serene Gaile ORN, MD;  Location: [facility] OR;  Service: Vascular;  Laterality: Left;   BASCILIC VEIN TRANSPOSITION Left 12/16/2023   Procedure: TRANSPOSITION, VEIN, BASILIC;  Surgeon: Serene Gaile ORN, MD;  Location: [facility] OR;  Service: Vascular;  Laterality: Left;   CORONARY IMAGING/OCT N/A 08/30/2023   Procedure: CORONARY IMAGING/OCT;  Surgeon: Wendel Lurena POUR, MD;   Location: [facility] INVASIVE CV LAB;  Service: Cardiovascular;  Laterality: N/A;   CORONARY STENT INTERVENTION N/A 08/30/2023   Procedure: CORONARY STENT INTERVENTION;  Surgeon: Wendel Lurena POUR, MD;  Location: [facility] INVASIVE CV LAB;  Service: Cardiovascular;  Laterality: N/A;   DIALYSIS/PERMA CATHETER INSERTION N/A 11/30/2023   Procedure: DIALYSIS/PERMA CATHETER INSERTION;  Surgeon: Pearline Norman RAMAN, MD;  Location: [facility] PV LAB;  Service: Cardiovascular;  Laterality: N/A;   INCONTINENCE SURGERY     IR FLUORO GUIDE CV LINE LEFT  09/10/2023   IR US  GUIDE VASC ACCESS LEFT  09/10/2023   LEFT HEART CATH AND CORONARY ANGIOGRAPHY N/A 07/15/2023   Procedure: LEFT HEART CATH AND CORONARY ANGIOGRAPHY;  Surgeon: Wendel Lurena POUR, MD;  Location: [facility];  Service: Cardiovascular;  Laterality: N/A;   LEFT HEART CATH AND CORONARY ANGIOGRAPHY N/A 08/30/2023   Procedure: LEFT HEART CATH AND CORONARY ANGIOGRAPHY;  Surgeon: Wendel Lurena POUR, MD;  Location: [facility] INVASIVE CV LAB;  Service: Cardiovascular;  Laterality: N/A;   NASAL FRACTURE SURGERY     ovary removed     OVARY SURGERY     PUBOVAGINAL SLING  08/16/2011   Procedure: CARLOYN GLADE;  Surgeon: Alm RAMAN Fragmin, MD;  Location: [facility] ORS;  Service: Urology;  Laterality: N/A;  TEE WITHOUT CARDIOVERSION N/A 09/08/2023   Procedure: ECHOCARDIOGRAM, TRANSESOPHAGEAL;  Surgeon: Alvan Dorn FALCON, MD;  Location: [facility] ORS;  Service: Endoscopy;  Laterality: N/A;   TUNNELLED CATHETER EXCHANGE N/A 09/30/2023   Procedure: TUNNELLED CATHETER EXCHANGE;  Surgeon: Melia Lynwood ORN, MD;  Location: [facility] INVASIVE CV LAB;  Service: Cardiovascular;  Laterality: N/A;   TUNNELLED CATHETER EXCHANGE N/A 10/07/2023   Procedure: TUNNELLED CATHETER EXCHANGE;  Surgeon: Tobie Gordy POUR, MD;  Location: [facility] INVASIVE CV LAB;  Service: Cardiovascular;  Laterality: N/A;   UTERINE FIBROID SURGERY  2001    Family History  Problem Relation Age of Onset   Asthma Mother    Bipolar disorder  Mother    Heart disease Father    Lymphoma Father    Hypertension Father    Thyroid  disease Father    Hyperlipidemia Father    Diabetes Father    Cancer Paternal Grandmother        lung and breast   Bladder Cancer Paternal Grandfather    Suicidality Maternal Grandfather    Thyroid  disease Brother     Social History   Socioeconomic History   Marital status: Married    Spouse name: Not on file   Number of children: 0   Years of education: 12   Highest education level: Not on file  Occupational History   Occupation: Disabled  Tobacco Use   Smoking status: Former    Current packs/day: 0.00    Average packs/day: 0.8 packs/day for 20.0 years (15.0 ttl pk-yrs)    Types: Cigarettes    Start date: 06/08/1991    Quit date: 06/08/2011    Years since quitting: 12.7    Passive exposure: Past   Smokeless tobacco: Never  Vaping Use   Vaping status: Never Used  Substance and Sexual Activity   Alcohol use: No   Drug use: No   Sexual activity: Yes    Birth control/protection: Post-menopausal  Other Topics Concern   Not on file  Social History Narrative   Lives at home with husband.   Right-handed.   Occasional caffeine use.   Social Drivers of Corporate investment banker Strain: Low Risk  (12/22/2022)   Received from [facility]   Overall Financial Resource Strain (CARDIA)    Difficulty of Paying Living Expenses: Not hard at all  Food Insecurity: Low Risk  (01/02/2024)   Received from [facility]   Hunger Vital Sign    Within the past 12 months, you worried that your food would run out before you got money to buy more: Never true    Within the past 12 months, the food you bought just didn't last and you didn't have money to get more. : Never true  Transportation Needs: No Transportation Needs (01/02/2024)   Received from [facility]   Transportation    In the past 12 months, has lack of reliable transportation kept you from medical appointments, meetings, work or from  getting things needed for daily living? : No  Physical Activity: Inactive (01/23/2019)   Exercise Vital Sign    Days of Exercise per Week: 0 days    Minutes of Exercise per Session: 0 min  Stress: Stress Concern Present (01/23/2019)   [employer] of Occupational Health - Occupational Stress Questionnaire    Feeling of Stress : Very much  Social Connections: Unknown (10/19/2021)   Received from [facility]   Social Network    Social Network: Not on file  Intimate Partner Violence: Not At  Risk (10/30/2023)   Humiliation, Afraid, Rape, and Kick questionnaire    Fear of Current or Ex-Partner: No    Emotionally Abused: No    Physically Abused: No    Sexually Abused: No    Allergies  Allergen Reactions   Ciprofloxacin  Swelling and Other (See Comments)    Per pt caused lips swell and nauseous feeling   Levaquin [Levofloxacin] Swelling and Other (See Comments)    Per pt caused lips swell and nauseous feeling   Linaclotide Other (See Comments)    Cause severe dehydration   Promethazine  Other (See Comments)    Completely wipes out/fatigue   Buspar [Buspirone] Other (See Comments)    abd cramping   Advair Diskus [Fluticasone -Salmeterol] Other (See Comments)    Thrush    Biaxin [Clarithromycin] Rash   Hydroxyzine Palpitations    Current Facility-Administered Medications  Medication Dose Route Frequency Provider Last Rate Last Admin   Heparin  (Porcine) in NaCl 1000-0.9 UT/500ML-% SOLN    PRN Magda Debby SAILOR, MD   500 mL at 03/05/24 0807   iodixanol  (VISIPAQUE ) 320 MG/ML injection    PRN Magda Debby SAILOR, MD   25 mL at 03/05/24 0811   lidocaine  (PF) (XYLOCAINE ) 1 % injection    PRN Magda Debby SAILOR, MD   2 mL at 03/05/24 0810    PHYSICAL EXAM Vitals:   03/05/24 0743 03/05/24 0804  BP: 124/61   Pulse: (!) 59   Resp: 16   SpO2: 99% 98%   Middle-age woman in no distress Regular rate and rhythm Unlabored breathing Left arm brachiobasilic AV fistula with pulsatile  thrill  PERTINENT LABORATORY AND RADIOLOGIC DATA  Most recent CBC    Latest Ref Rng & Units 02/21/2024   11:08 AM 12/16/2023   10:57 AM 12/02/2023    7:23 PM  CBC  WBC 4.0 - 10.5 K/uL 7.5   5.5   Hemoglobin 12.0 - 15.0 g/dL 9.7  9.2  8.4   Hematocrit 36.0 - 46.0 % 30.2  27.0  27.4   Platelets 150 - 400 K/uL 247   193      Most recent CMP    Latest Ref Rng & Units 02/21/2024   11:08 AM 12/16/2023   10:57 AM 12/05/2023   12:05 PM  CMP  Glucose 70 - 99 mg/dL 886  62    BUN 6 - 20 mg/dL 45  28    Creatinine 9.55 - 1.00 mg/dL 2.71  5.99    Sodium 864 - 145 mmol/L 136  135    Potassium 3.5 - 5.1 mmol/L 4.1  4.2    Chloride 98 - 111 mmol/L 101  100    CO2 22 - 32 mmol/L 24     Calcium  8.9 - 10.3 mg/dL 9.1     Total Protein 6.0 - 8.5 g/dL   5.4   Total Bilirubin 0.0 - 1.2 mg/dL   <9.7   Alkaline Phos 44 - 121 IU/L   133   AST 0 - 40 IU/L   11   ALT 0 - 32 IU/L   6     Renal function Estimated Creatinine Clearance: 12.6 mL/min (A) (by C-G formula based on SCr of 7.28 mg/dL (H)).  Hgb A1c MFr Bld (%)  Date Value  03/28/2023 5.6    LDL Chol Calc (NIH)  Date Value Ref Range Status  12/05/2023 36 0 - 99 mg/dL Final    Debby SAILOR. Magda, MD FACS [facility]  Number: [phone] 03/05/2024 8:21 AM   Total time spent on preparing this encounter including chart review, data review, collecting history, examining the patient, and coordinating care: 30 minutes.   Portions of this report may have been transcribed using voice recognition software.  Every effort has been made to ensure accuracy; however, inadvertent computerized transcription errors may still be present.

## 2024-03-06 ENCOUNTER — Encounter (HOSPITAL_COMMUNITY): Payer: Self-pay | Admitting: Vascular Surgery

## 2024-03-07 ENCOUNTER — Ambulatory Visit

## 2024-03-07 ENCOUNTER — Encounter: Payer: Self-pay | Admitting: Internal Medicine

## 2024-03-07 ENCOUNTER — Ambulatory Visit: Attending: Internal Medicine | Admitting: Internal Medicine

## 2024-03-07 VITALS — BP 98/46 | HR 65 | Ht 69.0 in | Wt 260.4 lb

## 2024-03-07 DIAGNOSIS — E1022 Type 1 diabetes mellitus with diabetic chronic kidney disease: Secondary | ICD-10-CM | POA: Diagnosis not present

## 2024-03-07 DIAGNOSIS — E1159 Type 2 diabetes mellitus with other circulatory complications: Secondary | ICD-10-CM

## 2024-03-07 DIAGNOSIS — Z6838 Body mass index (BMI) 38.0-38.9, adult: Secondary | ICD-10-CM

## 2024-03-07 DIAGNOSIS — N186 End stage renal disease: Secondary | ICD-10-CM | POA: Diagnosis not present

## 2024-03-07 DIAGNOSIS — I2510 Atherosclerotic heart disease of native coronary artery without angina pectoris: Secondary | ICD-10-CM

## 2024-03-07 DIAGNOSIS — E785 Hyperlipidemia, unspecified: Secondary | ICD-10-CM

## 2024-03-07 DIAGNOSIS — E1169 Type 2 diabetes mellitus with other specified complication: Secondary | ICD-10-CM

## 2024-03-07 DIAGNOSIS — I700 Atherosclerosis of aorta: Secondary | ICD-10-CM | POA: Diagnosis not present

## 2024-03-07 DIAGNOSIS — R002 Palpitations: Secondary | ICD-10-CM

## 2024-03-07 DIAGNOSIS — I152 Hypertension secondary to endocrine disorders: Secondary | ICD-10-CM

## 2024-03-07 DIAGNOSIS — Z992 Dependence on renal dialysis: Secondary | ICD-10-CM

## 2024-03-07 LAB — LIPID PANEL

## 2024-03-07 MED ORDER — ISOSORBIDE MONONITRATE ER 30 MG PO TB24: 30.0000 mg | ORAL_TABLET | Freq: Every day | ORAL | Status: DC

## 2024-03-07 MED ORDER — METOPROLOL SUCCINATE ER 25 MG PO TB24: 12.5000 mg | ORAL_TABLET | Freq: Every day | ORAL | 3 refills | Status: DC

## 2024-03-07 MED ORDER — ASPIRIN 81 MG PO TBEC: 81.0000 mg | DELAYED_RELEASE_TABLET | Freq: Every day | ORAL | Status: DC

## 2024-03-07 NOTE — Progress Notes (Unsigned)
 Enrolled patient for a 7 day Zio XT monitor to be mailed to patients home.

## 2024-03-07 NOTE — Patient Instructions (Addendum)
 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   Testing/Procedures:  Will be mailed to your home 3 to 7 days Your physician has recommended that you wear a holter monitor- 3 day ZIO. Holter monitors are medical devices that record the heart's electrical activity. Doctors most often use these monitors to diagnose arrhythmias. Arrhythmias are problems with the speed or rhythm of the heartbeat. The monitor is a small, portable device. You can wear one while you do your normal daily activities. This is usually used to diagnose what is causing palpitations/syncope (passing out).  Follow-Up: At [facility], you and your health needs are our priority.  As part of our continuing mission to provide you with exceptional heart care, we have created designated Provider Care Teams.  These Care Teams include your primary Cardiologist (physician) and Advanced Practice Providers (APPs -  Physician Assistants and Nurse Practitioners) who all work together to provide you with the care you need, when you need it.     Your next appointment:   6 month(s)  The format for your next appointment:   In Person  Provider:   Glendia Ferrier, PA-C     .   Other Instructions   ZIO XT- Long Term Monitor Instructions  Your physician has requested you wear a ZIO patch monitor for 3  days.  This is a single patch monitor. [employer] supplies one patch monitor per enrollment. Additional stickers are not available. Please do not apply patch if you will be having a Nuclear Stress Test,  Echocardiogram, Cardiac CT, MRI, or Chest Xray during the period you would be wearing the  monitor. The patch cannot be worn during these tests. You cannot remove and re-apply the  ZIO XT patch monitor.  Your ZIO patch monitor will be mailed 3 day USPS to your address on file. It may take 3-5 days  to receive your monitor after you have been enrolled.  Once you have received your monitor, please review the enclosed instructions. Your monitor  has already been registered assigning a specific monitor serial # to you.  Billing and Patient Assistance Program Information  We have supplied [employer] with any of your insurance information on file for billing purposes. [employer] offers a sliding scale Patient Assistance Program for patients that do not have  insurance, or whose insurance does not completely cover the cost of the ZIO monitor.  You must apply for the Patient Assistance Program to qualify for this discounted rate.  To apply, please call [employer] at [phone], select option 4, select option 2, ask to apply for  Patient Assistance Program. Meredeth will ask your household income, and how many people  are in your household. They will quote your out-of-pocket cost based on that information.  [employer] will also be able to set  up a 51-month, interest-free payment plan if needed.  Applying the monitor   Shave hair from upper left chest.  Hold abrader disc by orange tab. Rub abrader in 40 strokes over the upper left chest as  indicated in your monitor instructions.  Clean area with 4 enclosed alcohol pads. Let dry.  Apply patch as indicated in monitor instructions. Patch will be placed under collarbone on left  side of chest with arrow pointing upward.  Rub patch adhesive wings for 2 minutes.  Remove white label marked 1. Remove the white  label marked 2. Rub patch adhesive wings for 2 additional minutes.  While looking in a mirror, press and release button in center of patch. A small green light will  flash 3-4 times. This will be your only indicator that the monitor has been turned on.  Do not shower for the first 24 hours. You may shower after the first 24 hours.  Press the button if you feel a symptom. You will hear a small click. Record Date, Time and  Symptom in the Patient Logbook.  When you are ready to remove the patch, follow instructions on the last 2 pages of Patient  Logbook. Stick patch monitor onto the last page of Patient Logbook.  Place Patient Logbook in the blue and white box. Use locking tab on box and tape box closed  securely. The blue and white box has prepaid postage on it. Please place it in the mailbox as  soon as possible. Your physician should have your test results approximately 7 days after the  monitor has been mailed back to [facility].  Call [facility] Customer Care at [phone] if you have questions regarding  your ZIO XT patch monitor. Call them immediately if you see an orange light blinking on your  monitor.  If your monitor falls off in less than 4 days, contact our Monitor department at [phone].  If your monitor becomes loose or falls off after 4 days call [employer] at [phone] for  suggestions on securing your monitor

## 2024-03-08 ENCOUNTER — Encounter: Payer: Self-pay | Admitting: Internal Medicine

## 2024-03-08 ENCOUNTER — Ambulatory Visit: Payer: Self-pay | Admitting: Internal Medicine

## 2024-03-08 LAB — LIPID PANEL
Cholesterol, Total: 148 mg/dL (ref 100–199)
HDL: 81 mg/dL (ref >39)
LDL CALC COMMENT:: 1.8 ratio (ref 0.0–4.4)
LDL Chol Calc (NIH): 56 mg/dL (ref 0–99)
Triglycerides: 53 mg/dL (ref 0–149)
VLDL Cholesterol Cal: 11 mg/dL (ref 5–40)

## 2024-03-08 LAB — HEPATIC FUNCTION PANEL
ALT: 10 IU/L (ref 0–32)
AST: 13 IU/L (ref 0–40)
Albumin: 3.7 g/dL — ABNORMAL LOW (ref 3.8–4.9)
Alkaline Phosphatase: 133 IU/L (ref 49–135)
Bilirubin Total: 0.3 mg/dL (ref 0.0–1.2)
Bilirubin, Direct: 0.13 mg/dL (ref 0.00–0.40)
Total Protein: 5.9 g/dL — ABNORMAL LOW (ref 6.0–8.5)

## 2024-03-08 LAB — LIPOPROTEIN A (LPA): Lipoprotein (a): 31.8 nmol/L (ref <75.0)

## 2024-03-09 ENCOUNTER — Encounter: Payer: Self-pay | Admitting: Internal Medicine
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
## 2024-03-20 ENCOUNTER — Other Ambulatory Visit: Payer: Self-pay | Admitting: Pulmonary Disease

## 2024-03-21 ENCOUNTER — Telehealth: Admitting: Psychiatry

## 2024-03-26 DIAGNOSIS — I2510 Atherosclerotic heart disease of native coronary artery without angina pectoris: Secondary | ICD-10-CM | POA: Diagnosis not present

## 2024-03-26 DIAGNOSIS — R002 Palpitations: Secondary | ICD-10-CM

## 2024-03-27 ENCOUNTER — Other Ambulatory Visit (HOSPITAL_COMMUNITY): Payer: Self-pay | Admitting: Nephrology

## 2024-03-27 DIAGNOSIS — N186 End stage renal disease: Secondary | ICD-10-CM

## 2024-04-01 ENCOUNTER — Emergency Department (HOSPITAL_COMMUNITY)
Admission: EM | Admit: 2024-04-01 | Discharge: 2024-04-01 | Disposition: A | Discharge status: Home / Self Care | Attending: Emergency Medicine | Admitting: Emergency Medicine

## 2024-04-01 ENCOUNTER — Other Ambulatory Visit: Payer: Self-pay

## 2024-04-01 ENCOUNTER — Encounter (HOSPITAL_COMMUNITY): Payer: Self-pay | Admitting: *Deleted

## 2024-04-01 ENCOUNTER — Emergency Department (HOSPITAL_COMMUNITY)

## 2024-04-01 DIAGNOSIS — I120 Hypertensive chronic kidney disease with stage 5 chronic kidney disease or end stage renal disease: Secondary | ICD-10-CM | POA: Diagnosis not present

## 2024-04-01 DIAGNOSIS — N186 End stage renal disease: Secondary | ICD-10-CM | POA: Insufficient documentation

## 2024-04-01 DIAGNOSIS — Z794 Long term (current) use of insulin: Secondary | ICD-10-CM | POA: Insufficient documentation

## 2024-04-01 DIAGNOSIS — Y718 Miscellaneous cardiovascular devices associated with adverse incidents, not elsewhere classified: Secondary | ICD-10-CM | POA: Insufficient documentation

## 2024-04-01 DIAGNOSIS — I2510 Atherosclerotic heart disease of native coronary artery without angina pectoris: Secondary | ICD-10-CM | POA: Insufficient documentation

## 2024-04-01 DIAGNOSIS — Z7982 Long term (current) use of aspirin: Secondary | ICD-10-CM | POA: Diagnosis not present

## 2024-04-01 DIAGNOSIS — Z79890 Hormone replacement therapy (postmenopausal): Secondary | ICD-10-CM | POA: Diagnosis not present

## 2024-04-01 DIAGNOSIS — Z4901 Encounter for fitting and adjustment of extracorporeal dialysis catheter: Secondary | ICD-10-CM | POA: Diagnosis not present

## 2024-04-01 DIAGNOSIS — T8249XA Other complication of vascular dialysis catheter, initial encounter: Secondary | ICD-10-CM | POA: Insufficient documentation

## 2024-04-01 DIAGNOSIS — Z9889 Other specified postprocedural states: Secondary | ICD-10-CM

## 2024-04-01 DIAGNOSIS — E1122 Type 2 diabetes mellitus with diabetic chronic kidney disease: Secondary | ICD-10-CM | POA: Insufficient documentation

## 2024-04-01 NOTE — Discharge Instructions (Addendum)
 Keep your appointment with vascular surgery.  Return to the emergency department for any new or worsening symptoms of concern.

## 2024-04-01 NOTE — ED Provider Notes (Signed)
 [facility] AT [facility] Provider Note   CSN: [id number] Arrival date & time: 04/01/24  1045     Patient presents with: Vascular Access Problem   Jasmine [name] is a 53 y.o. female.   HPI Patient presents for emergent retraction of her tunneled dialysis catheter.  Her medical history includes ESRD, migraines, GERD, DM, bipolar disorder, HTN, neuropathy, depression, CAD.  In July, she had basilic vein transposition.  She had an infiltration event during cannulation of fistula last month.  She had tunneled dialysis catheter placed.  1 month ago, she underwent fistulogram and angioplasty with Dr. Magda.  She has returned to using this fistula for the past 2 weeks.  She undergoes dialysis on Tuesday, Thursday, Saturday.  She did get a full session yesterday.  She had an appointment scheduled for Wednesday to have TDC removed.  Last night, she had inadvertent retraction of the TDC.  She denies any other concerns.    Prior to Admission medications   Medication Sig Start Date End Date Taking? Authorizing Provider  acetaminophen  (TYLENOL ) 500 MG tablet Take 500-1,000 mg by mouth every 6 (six) hours as needed (pain.).    [provider]  albuterol  (VENTOLIN  HFA) 108 (90 Base) MCG/ACT inhaler INHALE ONE TO TWO PUFFS into THE lungs EVERY 6 HOURS AS NEEDED FOR WHEEZING OR SHORTNESS OF BREATH 03/20/24   Jude Harden GAILS, MD  aspirin  EC 81 MG tablet Take 1 tablet (81 mg total) by mouth daily with breakfast. Swallow whole. Stop taking Jun 06, 2024 03/07/24   Thukkani, Arun K, MD  Blood Glucose Monitoring Suppl DEVI 1 each by Does not apply route 3 (three) times daily. May dispense any manufacturer covered by patient's insurance. 11/03/23   Maree, Pratik D, DO  buPROPion  (WELLBUTRIN  XL) 150 MG 24 hr tablet Take 1 tablet (150 mg total) by mouth daily. 02/25/24 08/23/24  Vickey Mettle, MD  Cholecalciferol  (VITAMIN D -3 PO) Take 1 tablet by mouth in the morning.    [provider]  clopidogrel  (PLAVIX ) 75 MG tablet Take 1 tablet (75 mg total) by mouth daily. 02/22/23   Vannie Reche RAMAN, NP  Continuous Blood Gluc Sensor MISC 1 each by Does not apply route as directed. Use as directed every 14 days. May dispense FreeStyle [employer] or similar.    [provider]  diclofenac  Sodium (VOLTAREN ) 1 % GEL Apply 2 g topically 4 (four) times daily as needed (pain.).    [provider]  diphenhydrAMINE  (BENADRYL ) 25 MG tablet Take 50 mg by mouth 2 (two) times daily as needed for allergies or sleep.    [provider]  DULoxetine  (CYMBALTA ) 30 MG capsule Take 1 capsule (30 mg total) by mouth daily. 02/08/24 06/07/24  Vickey Mettle, MD  EPINEPHrine  0.3 mg/0.3 mL IJ SOAJ injection Inject 0.3 mg into the muscle as needed for anaphylaxis. 12/10/23   [provider]  fluticasone  (FLONASE ) 50 MCG/ACT nasal spray Place 2 sprays into both nostrils daily.    [provider]  FOLIC ACID  PO Take 1 capsule by mouth daily.    [provider]  glucagon (GLUCAGEN  HYPOKIT) 1 MG SOLR injection Inject 1 mg into the skin once as needed for up to 1 dose for low blood sugar. GlucaGen  HypoKit 1 mg Injection 03/31/23   Johnson, Clanford L, MD  Glucose Blood (BLOOD GLUCOSE TEST STRIPS) STRP 1 each by Does not apply route 3 (three) times daily. Use as directed to check  blood sugar. May dispense any manufacturer covered by patient's insurance and fits patient's device. 11/03/23   Maree, Pratik D, DO  hydrALAZINE  (APRESOLINE ) 25 MG tablet Take 3 tablets (75 mg total) by mouth 3 (three) times daily. Patient taking differently: Take 75 mg by mouth 3 (three) times daily. On Sun., Mon., Wed., Fri. 01/09/24   Thukkani, Arun K, MD  insulin  aspart (NOVOLOG ) 100 UNIT/ML FlexPen If eating and Blood Glucose (BG) 80 or higher inject 0 units for meal coverage and add correction dose per scale. If not eating, correction dose only. BG <150= 0 unit; BG 150-200= 1  unit; BG 201-250= 2 unit; BG 251-300= 3 unit; BG 301-350= 4 unit; BG 351-400= 5 unit; BG >400= 6 unit and Call Primary Care. (When not using pump) 11/03/23   Maree, Pratik D, DO  insulin  degludec (TRESIBA ) 100 UNIT/ML FlexTouch Pen Inject 50 Units into the skin daily. May substitute as needed per insurance. When not using pump. 11/03/23   Maree, Pratik D, DO  Insulin  Pen Needle (PEN NEEDLES) 31G X 5 MM MISC 1 each by Does not apply route 3 (three) times daily. May dispense any manufacturer covered by patient's insurance. 11/03/23   Maree, Pratik D, DO  isosorbide  mononitrate (IMDUR ) 30 MG 24 hr tablet Take 1 tablet (30 mg total) by mouth at bedtime. 03/07/24   Thukkani, Arun K, MD  lamoTRIgine  (LAMICTAL ) 200 MG tablet Take 1 tablet (200 mg total) by mouth every evening. 10/21/23 04/18/24  Vickey Mettle, MD  Lancet Device MISC 1 each by Does not apply route 3 (three) times daily. May dispense any manufacturer covered by patient's insurance. 11/03/23   Maree Bracken D, DO  Lancets MISC 1 each by Does not apply route 3 (three) times daily. Use as directed to check blood sugar. May dispense any manufacturer covered by patient's insurance and fits patient's device. 11/03/23   Maree, Pratik D, DO  levothyroxine  (SYNTHROID ) 175 MCG tablet Take 175 mcg by mouth daily before breakfast. 11/20/21   [provider]  lidocaine -prilocaine (EMLA) cream Apply topically 3 (three) times a week. 02/20/24   [provider]  Melatonin 10 MG TABS Take 10 mg by mouth at bedtime.    [provider]  metoprolol  succinate (TOPROL  XL) 25 MG 24 hr tablet Take 0.5 tablets (12.5 mg total) by mouth at bedtime. 03/07/24   Thukkani, Arun K, MD  ondansetron  (ZOFRAN -ODT) 4 MG disintegrating tablet Take 4 mg by mouth every 8 (eight) hours as needed for nausea or vomiting. 03/17/22   [provider]  oxyCODONE -acetaminophen  (PERCOCET/ROXICET) 5-325 MG tablet 1 tablet every 8 (eight) hours as needed for moderate pain  (pain score 4-6). For 15 days 01/30/24   [provider]  OXYGEN Inhale 3 L into the lungs at bedtime.    [provider]  predniSONE (DELTASONE) 10 MG tablet Take  40 mg by mouth  (4 tablets)x 2 days, them  20 mg (2  tablets) x 2 days then 10 mg ( one tablet) x 2 days 12/13/23   [provider]  predniSONE (DELTASONE) 20 MG tablet 20 mg. 12/26/23   [provider]  PRESCRIPTION MEDICATION See admin instructions.  IDPN LV CUSTOM (100GM)   INFUSE 1 BAG DURING HD 3 TIMES WEEKLY AS FOLLOWS 1ST WEEK 55ML/HR; 2ND WEEK 110ML/HR; 3RD WEEK 163ML/HR; THEN CONTINUE WITH GOAL RATE= 163ML/HR OVER 3HRS . TITRATE AS TOLERATED. ADMINISTER IV VIA VENOUS DRIP CHAMBER.    [provider]  rizatriptan  (MAXALT )  10 MG tablet Take 10 mg by mouth as needed for migraine. May repeat in 2 hours if needed    [provider]  rosuvastatin  (CRESTOR ) 10 MG tablet Take 1 tablet (10 mg total) by mouth daily. Patient taking differently: Take 10 mg by mouth every evening. 07/29/23 03/07/24  Lonni Slain, MD  sevelamer  carbonate (RENVELA ) 800 MG tablet Take 1,600 mg by mouth 3 (three) times daily with meals. 07/28/23   [provider]  torsemide  (DEMADEX ) 20 MG tablet Take 40 mg by mouth See admin instructions. Take 40 mg daily on non-dialysis Sun, Mon, Wed, and Fri    [provider]    Allergies: Ciprofloxacin , Levaquin [levofloxacin], Linaclotide, Promethazine , Buspar [buspirone], Advair diskus [fluticasone -salmeterol], Biaxin [clarithromycin], and Hydroxyzine    Review of Systems  Respiratory:  Negative for shortness of breath.   Cardiovascular:  Negative for chest pain.  All other systems reviewed and are negative.   Updated Vital Signs BP (!) 128/54 (BP Location: Right Arm)   Pulse 65   Temp 98.2 F (36.8 C) (Oral)   Resp 16   Ht 5' 9 (1.753 m)   Wt 118.4 kg   LMP 03/23/2017 (Approximate)   SpO2 94%   BMI 38.55 kg/m    Physical Exam Vitals and nursing note reviewed.  Constitutional:      General: She is not in acute distress.    Appearance: Normal appearance. She is well-developed. She is not ill-appearing, toxic-appearing or diaphoretic.  HENT:     Head: Normocephalic and atraumatic.     Right Ear: External ear normal.     Left Ear: External ear normal.     Nose: Nose normal.     Mouth/Throat:     Mouth: Mucous membranes are moist.  Eyes:     Extraocular Movements: Extraocular movements intact.     Conjunctiva/sclera: Conjunctivae normal.  Cardiovascular:     Rate and Rhythm: Normal rate and regular rhythm.     Heart sounds: No murmur heard.    Comments: Tunneled dialysis catheter in left upper chest, partially retracted. Pulmonary:     Effort: Pulmonary effort is normal. No respiratory distress.  Abdominal:     General: There is no distension.     Palpations: Abdomen is soft.  Musculoskeletal:        General: No swelling. Normal range of motion.     Cervical back: Neck supple.  Skin:    General: Skin is warm and dry.  Neurological:     General: No focal deficit present.     Mental Status: She is alert and oriented to person, place, and time.  Psychiatric:        Mood and Affect: Mood normal.        Behavior: Behavior normal.     (all labs ordered are listed, but only abnormal results are displayed) Labs Reviewed - No data to display  EKG: None  Radiology: DG Chest 1 View Result Date: 04/01/2024 CLINICAL DATA:  Catheter retracted. Patient reports dialysis catheter came out of left chest. EXAM: CHEST  1 VIEW COMPARISON:  Radiograph 02/21/2024 FINDINGS: The previous left-sided dialysis catheter is no longer seen. The heart is mildly enlarged. Diffuse interstitial and bronchial thickening. Elevated right hemidiaphragm. No pneumothorax. No large pleural effusion. IMPRESSION: 1. Previous left-sided dialysis catheter is no longer seen. 2. Diffuse interstitial and bronchial thickening,  may be pulmonary edema or atypical infection. Electronically Signed   By: Andrea Gasman M.D.   On: 04/01/2024 12:49  Procedures   Medications Ordered in the ED - No data to display                                  Medical Decision Making Amount and/or Complexity of Data Reviewed Radiology: ordered.   Patient presenting for inadvertent retraction of tunneled dialysis catheter.  This was placed while she was undergoing fistula repair of left arm.  She has since been using her fistula for the past 2 weeks.  On arrival in the ED, vital signs are normal.  Patient is well-appearing on exam.  She has no other concerns.  While in the ED, patient further removed her TDC.  It is now completely out.  I spoke with vascular surgeon on-call, Dr. Lanis, who advises occlusive dressing and discharge.  Occlusive dressing was placed.  Patient was discharged in good condition.     Final diagnoses:  H/O removal of dialysis catheter    ED Discharge Orders     None          Melvenia Motto, MD 04/01/24 1308

## 2024-04-01 NOTE — ED Triage Notes (Signed)
 Pt BIB EMS from home for c/o dialysis catheter that came out of her left chest in the middle of the night; pt denies any pain

## 2024-04-03 ENCOUNTER — Other Ambulatory Visit (HOSPITAL_BASED_OUTPATIENT_CLINIC_OR_DEPARTMENT_OTHER): Payer: Self-pay | Admitting: Cardiology

## 2024-04-03 ENCOUNTER — Other Ambulatory Visit: Payer: Self-pay | Admitting: Psychiatry

## 2024-04-03 DIAGNOSIS — I2510 Atherosclerotic heart disease of native coronary artery without angina pectoris: Secondary | ICD-10-CM

## 2024-04-04 ENCOUNTER — Encounter (HOSPITAL_COMMUNITY): Payer: Self-pay

## 2024-04-04 ENCOUNTER — Ambulatory Visit (HOSPITAL_COMMUNITY)
Admission: RE | Admit: 2024-04-04 | Discharge: 2024-04-04 | Disposition: A | Discharge status: Home / Self Care | Source: Ambulatory Visit | Attending: Nephrology | Admitting: Nephrology

## 2024-04-04 ENCOUNTER — Other Ambulatory Visit (HOSPITAL_COMMUNITY): Payer: Self-pay | Admitting: Nephrology

## 2024-04-04 DIAGNOSIS — N186 End stage renal disease: Secondary | ICD-10-CM

## 2024-04-04 HISTORY — PX: IR PATIENT EVAL TECH 0-60 MINS: IMG5564

## 2024-04-04 NOTE — Procedures (Signed)
 Patients name and DOB were verified. Patient pulled out HD catheter over the weekend and was told to still come to her appointment. I brought the patient back to redress the catheter site. Site was dressed with gauze and tegaderm.

## 2024-04-05 ENCOUNTER — Other Ambulatory Visit: Payer: Self-pay | Admitting: Medical Genetics

## 2024-04-05 DIAGNOSIS — Z006 Encounter for examination for normal comparison and control in clinical research program: Secondary | ICD-10-CM

## 2024-04-09 ENCOUNTER — Encounter: Payer: Self-pay | Admitting: Internal Medicine

## 2024-04-09 NOTE — Telephone Encounter (Signed)
 Spoke to patient . She states she having chest pain 5 out of 10. Over the weekend  8 out of 10  Only took 40 mg Demadex  today - usually dose.  Due to have dialysis tomorrow- in [place]    RN recommend for patient to go to [facility] for evaluation

## 2024-04-11 ENCOUNTER — Inpatient Hospital Stay (HOSPITAL_COMMUNITY)
Admission: EM | Admit: 2024-04-11 | Discharge: 2024-04-12 | DRG: 919 | Disposition: A | Discharge status: Home / Self Care | Attending: Family Medicine | Admitting: Family Medicine

## 2024-04-11 ENCOUNTER — Telehealth: Admitting: Psychiatry

## 2024-04-11 ENCOUNTER — Other Ambulatory Visit: Payer: Self-pay

## 2024-04-11 ENCOUNTER — Emergency Department (HOSPITAL_COMMUNITY)

## 2024-04-11 DIAGNOSIS — E039 Hypothyroidism, unspecified: Secondary | ICD-10-CM | POA: Diagnosis present

## 2024-04-11 DIAGNOSIS — Z807 Family history of other malignant neoplasms of lymphoid, hematopoietic and related tissues: Secondary | ICD-10-CM

## 2024-04-11 DIAGNOSIS — Z955 Presence of coronary angioplasty implant and graft: Secondary | ICD-10-CM

## 2024-04-11 DIAGNOSIS — R739 Hyperglycemia, unspecified: Secondary | ICD-10-CM | POA: Diagnosis not present

## 2024-04-11 DIAGNOSIS — E875 Hyperkalemia: Principal | ICD-10-CM | POA: Diagnosis present

## 2024-04-11 DIAGNOSIS — Z7902 Long term (current) use of antithrombotics/antiplatelets: Secondary | ICD-10-CM

## 2024-04-11 DIAGNOSIS — Z7982 Long term (current) use of aspirin: Secondary | ICD-10-CM

## 2024-04-11 DIAGNOSIS — D72829 Elevated white blood cell count, unspecified: Secondary | ICD-10-CM | POA: Diagnosis present

## 2024-04-11 DIAGNOSIS — F419 Anxiety disorder, unspecified: Secondary | ICD-10-CM | POA: Diagnosis present

## 2024-04-11 DIAGNOSIS — Y742 Prosthetic and other implants, materials and accessory general hospital and personal-use devices associated with adverse incidents: Secondary | ICD-10-CM | POA: Diagnosis present

## 2024-04-11 DIAGNOSIS — Z803 Family history of malignant neoplasm of breast: Secondary | ICD-10-CM

## 2024-04-11 DIAGNOSIS — F319 Bipolar disorder, unspecified: Secondary | ICD-10-CM | POA: Diagnosis present

## 2024-04-11 DIAGNOSIS — Z79890 Hormone replacement therapy (postmenopausal): Secondary | ICD-10-CM

## 2024-04-11 DIAGNOSIS — Z888 Allergy status to other drugs, medicaments and biological substances status: Secondary | ICD-10-CM

## 2024-04-11 DIAGNOSIS — I2510 Atherosclerotic heart disease of native coronary artery without angina pectoris: Secondary | ICD-10-CM | POA: Diagnosis present

## 2024-04-11 DIAGNOSIS — Z79899 Other long term (current) drug therapy: Secondary | ICD-10-CM

## 2024-04-11 DIAGNOSIS — Z6841 Body Mass Index (BMI) 40.0 and over, adult: Secondary | ICD-10-CM

## 2024-04-11 DIAGNOSIS — Z1152 Encounter for screening for COVID-19: Secondary | ICD-10-CM

## 2024-04-11 DIAGNOSIS — I5033 Acute on chronic diastolic (congestive) heart failure: Secondary | ICD-10-CM | POA: Diagnosis present

## 2024-04-11 DIAGNOSIS — Z833 Family history of diabetes mellitus: Secondary | ICD-10-CM

## 2024-04-11 DIAGNOSIS — Z8249 Family history of ischemic heart disease and other diseases of the circulatory system: Secondary | ICD-10-CM

## 2024-04-11 DIAGNOSIS — T85614A Breakdown (mechanical) of insulin pump, initial encounter: Secondary | ICD-10-CM | POA: Diagnosis not present

## 2024-04-11 DIAGNOSIS — Z881 Allergy status to other antibiotic agents status: Secondary | ICD-10-CM

## 2024-04-11 DIAGNOSIS — E1065 Type 1 diabetes mellitus with hyperglycemia: Secondary | ICD-10-CM | POA: Diagnosis present

## 2024-04-11 DIAGNOSIS — E669 Obesity, unspecified: Secondary | ICD-10-CM | POA: Diagnosis present

## 2024-04-11 DIAGNOSIS — Z825 Family history of asthma and other chronic lower respiratory diseases: Secondary | ICD-10-CM

## 2024-04-11 DIAGNOSIS — I700 Atherosclerosis of aorta: Secondary | ICD-10-CM | POA: Diagnosis present

## 2024-04-11 DIAGNOSIS — Z8349 Family history of other endocrine, nutritional and metabolic diseases: Secondary | ICD-10-CM

## 2024-04-11 DIAGNOSIS — D631 Anemia in chronic kidney disease: Secondary | ICD-10-CM | POA: Diagnosis present

## 2024-04-11 DIAGNOSIS — E8770 Fluid overload, unspecified: Secondary | ICD-10-CM

## 2024-04-11 DIAGNOSIS — I132 Hypertensive heart and chronic kidney disease with heart failure and with stage 5 chronic kidney disease, or end stage renal disease: Secondary | ICD-10-CM | POA: Diagnosis present

## 2024-04-11 DIAGNOSIS — Z8052 Family history of malignant neoplasm of bladder: Secondary | ICD-10-CM

## 2024-04-11 DIAGNOSIS — J45909 Unspecified asthma, uncomplicated: Secondary | ICD-10-CM | POA: Diagnosis present

## 2024-04-11 DIAGNOSIS — Z794 Long term (current) use of insulin: Secondary | ICD-10-CM

## 2024-04-11 DIAGNOSIS — Z818 Family history of other mental and behavioral disorders: Secondary | ICD-10-CM

## 2024-04-11 DIAGNOSIS — Z9981 Dependence on supplemental oxygen: Secondary | ICD-10-CM

## 2024-04-11 DIAGNOSIS — Z83438 Family history of other disorder of lipoprotein metabolism and other lipidemia: Secondary | ICD-10-CM

## 2024-04-11 DIAGNOSIS — N186 End stage renal disease: Secondary | ICD-10-CM | POA: Diagnosis present

## 2024-04-11 DIAGNOSIS — E1022 Type 1 diabetes mellitus with diabetic chronic kidney disease: Secondary | ICD-10-CM | POA: Diagnosis present

## 2024-04-11 DIAGNOSIS — Z801 Family history of malignant neoplasm of trachea, bronchus and lung: Secondary | ICD-10-CM

## 2024-04-11 DIAGNOSIS — E7800 Pure hypercholesterolemia, unspecified: Secondary | ICD-10-CM | POA: Diagnosis present

## 2024-04-11 DIAGNOSIS — Z87891 Personal history of nicotine dependence: Secondary | ICD-10-CM

## 2024-04-11 DIAGNOSIS — G4733 Obstructive sleep apnea (adult) (pediatric): Secondary | ICD-10-CM | POA: Diagnosis present

## 2024-04-11 DIAGNOSIS — M797 Fibromyalgia: Secondary | ICD-10-CM | POA: Diagnosis present

## 2024-04-11 DIAGNOSIS — Z992 Dependence on renal dialysis: Secondary | ICD-10-CM

## 2024-04-11 LAB — URINALYSIS, ROUTINE W REFLEX MICROSCOPIC
Bilirubin Urine: NEGATIVE
Glucose, UA: >=500 mg/dL — AB
Hgb urine dipstick: NEGATIVE
Ketones, ur: NEGATIVE mg/dL
Nitrite: NEGATIVE
Protein, ur: >=300 mg/dL — AB
Specific Gravity, Urine: 1.008 (ref 1.005–1.030)
pH: 8 (ref 5.0–8.0)

## 2024-04-11 LAB — BLOOD GAS, VENOUS
Acid-Base Excess: 1.6 mmol/L (ref 0.0–2.0)
Bicarbonate: 27.7 mmol/L (ref 20.0–28.0)
Drawn by: 21858
O2 Saturation: 46.1 %
Patient temperature: 36.6
pCO2, Ven: 48 mmHg (ref 44–60)
pH, Ven: 7.37 (ref 7.25–7.43)
pO2, Ven: <31 mmHg — CL (ref 32–45)

## 2024-04-11 LAB — POTASSIUM: Potassium: 6.5 mmol/L (ref 3.5–5.1)

## 2024-04-11 LAB — CBC WITH DIFFERENTIAL/PLATELET
Abs Immature Granulocytes: 0.07 K/uL (ref 0.00–0.07)
Basophils Absolute: 0.1 K/uL (ref 0.0–0.1)
Basophils Relative: 1 %
Eosinophils Absolute: 0.1 K/uL (ref 0.0–0.5)
Eosinophils Relative: 1 %
HCT: 37.7 % (ref 36.0–46.0)
Hemoglobin: 11.9 g/dL — ABNORMAL LOW (ref 12.0–15.0)
Immature Granulocytes: 1 %
Lymphocytes Relative: 11 %
Lymphs Abs: 1.5 K/uL (ref 0.7–4.0)
MCH: 30.5 pg (ref 26.0–34.0)
MCHC: 31.6 g/dL (ref 30.0–36.0)
MCV: 96.7 fL (ref 80.0–100.0)
Monocytes Absolute: 0.8 K/uL (ref 0.1–1.0)
Monocytes Relative: 6 %
Neutro Abs: 10.9 K/uL — ABNORMAL HIGH (ref 1.7–7.7)
Neutrophils Relative %: 80 %
Platelets: 263 K/uL (ref 150–400)
RBC: 3.9 MIL/uL (ref 3.87–5.11)
RDW: 14.1 % (ref 11.5–15.5)
WBC: 13.4 K/uL — ABNORMAL HIGH (ref 4.0–10.5)
nRBC: 0 % (ref 0.0–0.2)

## 2024-04-11 LAB — CBG MONITORING, ED
Glucose-Capillary: 437 mg/dL — ABNORMAL HIGH (ref 70–99)
Glucose-Capillary: 358 mg/dL — ABNORMAL HIGH (ref 70–99)
Glucose-Capillary: 356 mg/dL — ABNORMAL HIGH (ref 70–99)

## 2024-04-11 LAB — RESP PANEL BY RT-PCR (RSV, FLU A&B, COVID)  RVPGX2
Influenza A by PCR: NEGATIVE
Influenza B by PCR: NEGATIVE
Resp Syncytial Virus by PCR: NEGATIVE
SARS Coronavirus 2 by RT PCR: NEGATIVE

## 2024-04-11 LAB — BASIC METABOLIC PANEL WITH GFR
Anion gap: 15 (ref 5–15)
BUN: 48 mg/dL — ABNORMAL HIGH (ref 6–20)
CO2: 22 mmol/L (ref 22–32)
Calcium: 9.6 mg/dL (ref 8.9–10.3)
Chloride: 94 mmol/L — ABNORMAL LOW (ref 98–111)
Creatinine, Ser: 5.28 mg/dL — ABNORMAL HIGH (ref 0.44–1.00)
GFR, Estimated: 9 mL/min — ABNORMAL LOW (ref >60)
Glucose, Bld: 449 mg/dL — ABNORMAL HIGH (ref 70–99)
Potassium: 7.4 mmol/L (ref 3.5–5.1)
Sodium: 131 mmol/L — ABNORMAL LOW (ref 135–145)

## 2024-04-11 LAB — BETA-HYDROXYBUTYRIC ACID: Beta-Hydroxybutyric Acid: 1.26 mmol/L — ABNORMAL HIGH (ref 0.05–0.27)

## 2024-04-11 LAB — HEMOGLOBIN A1C
Hgb A1c MFr Bld: 5.5 % (ref 4.8–5.6)
Mean Plasma Glucose: 111.15 mg/dL

## 2024-04-11 LAB — HIV ANTIBODY (ROUTINE TESTING W REFLEX): HIV Screen 4th Generation wRfx: NONREACTIVE

## 2024-04-11 LAB — GLUCOSE, CAPILLARY: Glucose-Capillary: 287 mg/dL — ABNORMAL HIGH (ref 70–99)

## 2024-04-11 LAB — HEPATITIS B SURFACE ANTIGEN: Hepatitis B Surface Ag: NONREACTIVE

## 2024-04-11 LAB — PRO BRAIN NATRIURETIC PEPTIDE: Pro Brain Natriuretic Peptide: 6179 pg/mL — ABNORMAL HIGH (ref <300.0)

## 2024-04-11 MED ORDER — TORSEMIDE 20 MG PO TABS
40.0000 mg | ORAL_TABLET | ORAL | Status: DC
Start: 2024-04-11 — End: 2024-04-11

## 2024-04-11 MED ORDER — SODIUM ZIRCONIUM CYCLOSILICATE 5 G PO PACK
10.0000 g | PACK | ORAL | Status: AC
  Administered 2024-04-11: 10 g via ORAL
  Filled 2024-04-11: qty 2

## 2024-04-11 MED ORDER — CLOPIDOGREL BISULFATE 75 MG PO TABS
75.0000 mg | ORAL_TABLET | Freq: Every day | ORAL | Status: DC
  Filled 2024-04-11: qty 1

## 2024-04-11 MED ORDER — HEPARIN SODIUM (PORCINE) 5000 UNIT/ML IJ SOLN
5000.0000 [IU] | Freq: Three times a day (TID) | INTRAMUSCULAR | Status: DC
  Administered 2024-04-11 – 2024-04-12 (×2): 5000 [IU] via SUBCUTANEOUS
  Filled 2024-04-11 (×2): qty 1

## 2024-04-11 MED ORDER — SEVELAMER CARBONATE 800 MG PO TABS
1600.0000 mg | ORAL_TABLET | Freq: Three times a day (TID) | ORAL | Status: DC
Start: 2024-04-12 — End: 2024-04-12
  Administered 2024-04-12: 1600 mg via ORAL
  Filled 2024-04-11: qty 2

## 2024-04-11 MED ORDER — TORSEMIDE 20 MG PO TABS: 40.0000 mg | ORAL_TABLET | ORAL | Status: DC

## 2024-04-11 MED ORDER — LEVOTHYROXINE SODIUM 50 MCG PO TABS
175.0000 ug | ORAL_TABLET | Freq: Every day | ORAL | Status: DC
  Administered 2024-04-12: 175 ug via ORAL
  Filled 2024-04-11: qty 1

## 2024-04-11 MED ORDER — CHLORHEXIDINE GLUCONATE CLOTH 2 % EX PADS
6.0000 | MEDICATED_PAD | Freq: Every day | CUTANEOUS | Status: DC
  Administered 2024-04-12: 6 via TOPICAL

## 2024-04-11 MED ORDER — SODIUM ZIRCONIUM CYCLOSILICATE 10 G PO PACK
10.0000 g | PACK | Freq: Three times a day (TID) | ORAL | Status: DC
Start: 2024-04-11 — End: 2024-04-12
  Administered 2024-04-11: 10 g via ORAL
  Filled 2024-04-11 (×2): qty 1

## 2024-04-11 MED ORDER — DULOXETINE HCL 30 MG PO CPEP
30.0000 mg | ORAL_CAPSULE | Freq: Every day | ORAL | Status: DC
  Filled 2024-04-11: qty 1

## 2024-04-11 MED ORDER — INSULIN ASPART 100 UNIT/ML IJ SOLN
8.0000 [IU] | Freq: Three times a day (TID) | INTRAMUSCULAR | Status: DC
  Administered 2024-04-11 – 2024-04-12 (×2): 8 [IU] via SUBCUTANEOUS
  Filled 2024-04-11 (×2): qty 1

## 2024-04-11 MED ORDER — SODIUM BICARBONATE 8.4 % IV SOLN
50.0000 meq | Freq: Once | INTRAVENOUS | Status: AC
  Administered 2024-04-11: 50 meq via INTRAVENOUS
  Filled 2024-04-11: qty 50

## 2024-04-11 MED ORDER — INSULIN ASPART 100 UNIT/ML IV SOLN
6.0000 [IU] | Freq: Once | INTRAVENOUS | Status: AC
  Administered 2024-04-11: 6 [IU] via INTRAVENOUS
  Filled 2024-04-11: qty 1

## 2024-04-11 MED ORDER — CALCIUM GLUCONATE-NACL 1-0.675 GM/50ML-% IV SOLN
1.0000 g | Freq: Once | INTRAVENOUS | Status: AC
  Administered 2024-04-11: 1000 mg via INTRAVENOUS
  Filled 2024-04-11: qty 50

## 2024-04-11 MED ORDER — LAMOTRIGINE 100 MG PO TABS
200.0000 mg | ORAL_TABLET | Freq: Every evening | ORAL | Status: DC
  Administered 2024-04-11: 200 mg via ORAL
  Filled 2024-04-11: qty 2

## 2024-04-11 MED ORDER — INSULIN GLARGINE-YFGN 100 UNIT/ML ~~LOC~~ SOLN
10.0000 [IU] | Freq: Two times a day (BID) | SUBCUTANEOUS | Status: DC
  Administered 2024-04-11: 10 [IU] via SUBCUTANEOUS
  Filled 2024-04-11 (×4): qty 0.1

## 2024-04-11 MED ORDER — ALBUTEROL SULFATE (2.5 MG/3ML) 0.083% IN NEBU
10.0000 mg | INHALATION_SOLUTION | Freq: Once | RESPIRATORY_TRACT | Status: AC
  Administered 2024-04-11: 10 mg via RESPIRATORY_TRACT
  Filled 2024-04-11: qty 12

## 2024-04-11 MED ORDER — ROSUVASTATIN CALCIUM 10 MG PO TABS
10.0000 mg | ORAL_TABLET | Freq: Every day | ORAL | Status: DC
  Administered 2024-04-11: 10 mg via ORAL
  Filled 2024-04-11: qty 1

## 2024-04-11 MED ORDER — INSULIN ASPART 100 UNIT/ML IJ SOLN
0.0000 [IU] | Freq: Every day | INTRAMUSCULAR | Status: DC
  Administered 2024-04-11: 3 [IU] via SUBCUTANEOUS
  Filled 2024-04-11: qty 1

## 2024-04-11 MED ORDER — INSULIN ASPART 100 UNIT/ML IJ SOLN
0.0000 [IU] | Freq: Three times a day (TID) | INTRAMUSCULAR | Status: DC
  Administered 2024-04-12: 7 [IU] via SUBCUTANEOUS
  Filled 2024-04-11 (×2): qty 1

## 2024-04-11 MED ORDER — ASPIRIN 81 MG PO TBEC
81.0000 mg | DELAYED_RELEASE_TABLET | Freq: Every day | ORAL | Status: DC
  Administered 2024-04-11: 81 mg via ORAL
  Filled 2024-04-11: qty 1

## 2024-04-11 MED ORDER — FOLIC ACID 1 MG PO TABS
1.0000 mg | ORAL_TABLET | Freq: Every day | ORAL | Status: DC
  Filled 2024-04-11: qty 1

## 2024-04-11 NOTE — ED Notes (Signed)
 Date and time results received: 04/11/24 1821 (use smartphrase .now to insert current time)  Test: portassium Critical Value: 6.5  Name of Provider Notified: DR Yolande  Orders Received? Or Actions Taken?: Orders Received - See Orders for details

## 2024-04-11 NOTE — ED Triage Notes (Signed)
 Pt states her BS this am was 76 and after having a hot dog and some apple juice today it is now in the 400's. EMS has 415. Hx diabtees, HTN, kidney dz., on dialysis (Tues, Thur, and Sat. Did complete dialysis yesterday (Tuesday)

## 2024-04-11 NOTE — H&P (Incomplete)
 History and Physical  Jasmine [name] FMW:[id number] DOB: 09/09/1970 DOA: 04/11/2024  Referring physician: Dr. Yolande, EDP  PCP: Debrah Josette ORN., PA-C  Outpatient Specialists: Endocrinology, nephrology Patient coming from: Home  Chief Complaint: Hyperglycemia, insulin  pump malfunctioning.  HPI: Jasmine [name] is a 53 y.o. female with medical history significant for ESRD on hemodialysis TTS, type 1 diabetes with insulin  pump, coronary artery disease, aortic atherosclerosis, hypertension, hyperlipidemia, chronic anxiety/depression, hypothyroidism, OSA on CPAP, obesity, who presented to the ER due to hyperglycemia and insulin  pump malfunctioning for the past few days.  In the ER, upon arrival, CBG over 400.  Lab studies notable for serum glucose 449, serum potassium 7.4, serum bicarb 22 with anion gap of 15.  The patient received IV calcium  gluconate 1 g x 1, albuterol  neb 10 mg x 1, IV insulin  6 mg x 1, Lokelma 10 g x 1.  EDP discussed the case with nephrology for possible hemodialysis during this admission.  Admitted by [facility], hospitalist service.  ED Course: Blood pressure 125/51, pulse 68, respiration rate 18, O2 saturation 100% on room air.  Review of Systems: Review of systems as noted in the HPI. All other systems reviewed and are negative.   Past Medical History:  Diagnosis Date   Anemia    Anxiety    Arthritis    Asthma    Balance problems    Bipolar disorder (HCC)    Charcot ankle    CHF (congestive heart failure) (HCC)    Chronic fatigue    Coronary artery disease    Depression    DKA, type 1 (HCC) 11/04/2011   Elevated cholesterol    ESRD on hemodialysis (HCC)    TTS at [facility]   Fibromyalgia    GERD (gastroesophageal reflux disease)    Headache    History of suicidal ideation    Hyperlipemia    Hypertension    Hypothyroidism    IBS (irritable bowel syndrome)    Memory changes    Obesity    Pneumonia    x several   Sleep apnea    uses oxygen 3L  at night with cpap nightly   Stress incontinence    Pt had surgery to correct this.   Tobacco abuse    Tremor    Past Surgical History:  Procedure Laterality Date   A/V FISTULAGRAM Left 03/05/2024   Procedure: A/V Fistulagram;  Surgeon: Magda Debby LOISE, MD;  Location: [facility];  Service: Cardiovascular;  Laterality: Left;   A/V SHUNT INTERVENTION N/A 03/05/2024   Procedure: A/V SHUNT INTERVENTION;  Surgeon: Magda Debby LOISE, MD;  Location: [facility];  Service: Cardiovascular;  Laterality: N/A;   AV FISTULA PLACEMENT Left 10/14/2023   Procedure: BRACHIOBASILIC ARTERIOVENOUS (AV) FISTULA CREATION LEFT;  Surgeon: Serene Gaile ORN, MD;  Location: [facility];  Service: Vascular;  Laterality: Left;   BASCILIC VEIN TRANSPOSITION Left 12/16/2023   Procedure: TRANSPOSITION, VEIN, BASILIC;  Surgeon: Serene Gaile ORN, MD;  Location: [facility];  Service: Vascular;  Laterality: Left;   CORONARY IMAGING/OCT N/A 08/30/2023   Procedure: CORONARY IMAGING/OCT;  Surgeon: Wendel Lurena POUR, MD;  Location: [facility];  Service: Cardiovascular;  Laterality: N/A;   CORONARY STENT INTERVENTION N/A 08/30/2023   Procedure: CORONARY STENT INTERVENTION;  Surgeon: Wendel Lurena POUR, MD;  Location: [facility];  Service: Cardiovascular;  Laterality: N/A;   DIALYSIS/PERMA CATHETER INSERTION N/A 11/30/2023   Procedure: DIALYSIS/PERMA CATHETER INSERTION;  Surgeon: Pearline Norman RAMAN, MD;  Location:  [facility];  Service: Cardiovascular;  Laterality: N/A;   INCONTINENCE SURGERY     IR FLUORO GUIDE CV LINE LEFT  09/10/2023   IR PATIENT EVAL TECH 0-60 MINS  04/04/2024   IR US  GUIDE VASC ACCESS LEFT  09/10/2023   LEFT HEART CATH AND CORONARY ANGIOGRAPHY N/A 07/15/2023   Procedure: LEFT HEART CATH AND CORONARY ANGIOGRAPHY;  Surgeon: Wendel Lurena POUR, MD;  Location: [facility];  Service: Cardiovascular;  Laterality: N/A;   LEFT HEART CATH AND CORONARY ANGIOGRAPHY N/A 08/30/2023   Procedure: LEFT HEART CATH AND  CORONARY ANGIOGRAPHY;  Surgeon: Wendel Lurena POUR, MD;  Location: [facility];  Service: Cardiovascular;  Laterality: N/A;   NASAL FRACTURE SURGERY     ovary removed     OVARY SURGERY     PUBOVAGINAL SLING  08/16/2011   Procedure: CARLOYN GLADE;  Surgeon: Alm GORMAN Fragmin, MD;  Location: [facility];  Service: Urology;  Laterality: N/A;          TEE WITHOUT CARDIOVERSION N/A 09/08/2023   Procedure: ECHOCARDIOGRAM, TRANSESOPHAGEAL;  Surgeon: Alvan Dorn FALCON, MD;  Location: [facility];  Service: Endoscopy;  Laterality: N/A;   TUNNELLED CATHETER EXCHANGE N/A 09/30/2023   Procedure: TUNNELLED CATHETER EXCHANGE;  Surgeon: Melia Lynwood ORN, MD;  Location: [facility] INVASIVE CV LAB;  Service: Cardiovascular;  Laterality: N/A;   TUNNELLED CATHETER EXCHANGE N/A 10/07/2023   Procedure: TUNNELLED CATHETER EXCHANGE;  Surgeon: Tobie Gordy POUR, MD;  Location: [facility] INVASIVE CV LAB;  Service: Cardiovascular;  Laterality: N/A;   UTERINE FIBROID SURGERY  2001   VENOUS ANGIOPLASTY Left 03/05/2024   Procedure: VENOUS ANGIOPLASTY;  Surgeon: Magda Debby SAILOR, MD;  Location: [facility];  Service: Cardiovascular;  Laterality: Left;  Basilic Vein    Social History:  reports that she quit smoking about 12 years ago. Her smoking use included cigarettes. She started smoking about 32 years ago. She has a 15 pack-year smoking history. She has been exposed to tobacco smoke. She has never used smokeless tobacco. She reports that she does not drink alcohol and does not use drugs.   Allergies  Allergen Reactions   Ciprofloxacin  Nausea Only, Swelling, Dermatitis and Other (See Comments)    Per pt caused lips swell and nauseous feeling   Levofloxacin Other (See Comments), Swelling and Nausea Only    Per pt caused lips swell and nauseous feeling   Linaclotide Nausea Only and Other (See Comments)    Cause severe dehydration   Promethazine  Other (See Comments)    Completely wipes out/fatigue   Buspar [Buspirone] Other (See Comments)     Abd cramping   Advair Diskus [Fluticasone -Salmeterol] Other (See Comments)    Thrush    Clarithromycin Dermatitis and Rash   Hydroxyzine Palpitations   Ondansetron  Hcl Other (See Comments)    Makes pt more nauseous and drunk    Family History  Problem Relation Age of Onset   Asthma Mother    Bipolar disorder Mother    Heart disease Father    Lymphoma Father    Hypertension Father    Thyroid  disease Father    Hyperlipidemia Father    Diabetes Father    Cancer Paternal Grandmother        lung and breast   Bladder Cancer Paternal Grandfather    Suicidality Maternal Grandfather    Thyroid  disease Brother       Prior to Admission medications   Medication Sig Start Date End Date Taking? Authorizing Provider  acetaminophen  (TYLENOL )  500 MG tablet Take 1,000 mg by mouth every 6 (six) hours as needed (pain.).   Yes [provider]  albuterol  (VENTOLIN  HFA) 108 (90 Base) MCG/ACT inhaler INHALE ONE TO TWO PUFFS into THE lungs EVERY 6 HOURS AS NEEDED FOR WHEEZING OR SHORTNESS OF BREATH Patient taking differently: Inhale 1-2 puffs into the lungs every 6 (six) hours as needed for wheezing or shortness of breath. 03/20/24  Yes Jude Harden GAILS, MD  aspirin  EC 81 MG tablet Take 1 tablet (81 mg total) by mouth daily with breakfast. Swallow whole. Stop taking Jun 06, 2024 Patient taking differently: Take 81 mg by mouth at bedtime. Swallow whole. Stop taking Jun 06, 2024 03/07/24  Yes Thukkani, Arun K, MD  buPROPion  (WELLBUTRIN  XL) 150 MG 24 hr tablet Take 1 tablet (150 mg total) by mouth daily. 02/25/24 08/23/24 Yes Hisada, Katheren, MD  Cholecalciferol  (VITAMIN D -3 PO) Take 1 tablet by mouth daily.   Yes [provider]  clopidogrel  (PLAVIX ) 75 MG tablet Take 1 tablet (75 mg total) by mouth daily. 02/22/23  Yes Vannie Reche RAMAN, NP  DULoxetine  (CYMBALTA ) 30 MG capsule Take 1 capsule (30 mg total) by mouth daily. 02/08/24 06/07/24 Yes Hisada, Katheren, MD  EPINEPHrine  0.3 mg/0.3 mL IJ  SOAJ injection Inject 0.3 mg into the muscle as needed for anaphylaxis. 12/10/23  Yes [provider]  fluticasone  (FLONASE ) 50 MCG/ACT nasal spray Place 2 sprays into both nostrils daily as needed for rhinitis or allergies (sinus headache).   Yes [provider]  FOLIC ACID  PO Take 1 capsule by mouth daily.   Yes [provider]  glucagon (GLUCAGEN  HYPOKIT) 1 MG SOLR injection Inject 1 mg into the skin once as needed for up to 1 dose for low blood sugar. GlucaGen  HypoKit 1 mg Injection 03/31/23  Yes Johnson, Clanford L, MD  hydrALAZINE  (APRESOLINE ) 25 MG tablet Take 3 tablets (75 mg total) by mouth 3 (three) times daily. Patient taking differently: Take 75 mg by mouth in the morning and at bedtime. On Sun., Mon., Wed., Fri. 01/09/24  Yes Thukkani, Arun K, MD  isosorbide  mononitrate (IMDUR ) 30 MG 24 hr tablet Take 1 tablet (30 mg total) by mouth at bedtime. Patient taking differently: Take 30 mg by mouth daily. 03/07/24  Yes Thukkani, Arun K, MD  lamoTRIgine  (LAMICTAL ) 200 MG tablet Take 1 tablet (200 mg total) by mouth every evening. 10/21/23 04/18/24 Yes Vickey Katheren, MD  levothyroxine  (SYNTHROID ) 175 MCG tablet Take 175 mcg by mouth daily before breakfast. 11/20/21  Yes [provider]  lidocaine -prilocaine (EMLA) cream Apply topically 3 (three) times a week. 02/20/24  Yes [provider]  LORazepam  (ATIVAN ) 0.5 MG tablet Take 1 tablet (0.5 mg total) by mouth daily as needed for anxiety. 04/03/24 05/03/24 Yes Hisada, Katheren, MD  Melatonin 10 MG TABS Take 10 mg by mouth at bedtime.   Yes [provider]  metoprolol  succinate (TOPROL  XL) 25 MG 24 hr tablet Take 0.5 tablets (12.5 mg total) by mouth at bedtime. 03/07/24  Yes Thukkani, Arun K, MD  NOVOLOG  100 UNIT/ML injection Inject 120 Units into the skin 2 days. Via insulin  pump   Yes [provider]  ondansetron  (ZOFRAN -ODT) 4 MG disintegrating tablet Take 4 mg by mouth every 8 (eight) hours as  needed for nausea or vomiting. 03/17/22  Yes [provider]  oxyCODONE -acetaminophen  (PERCOCET/ROXICET) 5-325 MG tablet Take 1 tablet by mouth every 8 (eight) hours as needed for moderate pain (pain score 4-6). For 15  days 01/30/24  Yes [provider]  OXYGEN Inhale 3 L into the lungs at bedtime.   Yes [provider]  rizatriptan  (MAXALT ) 10 MG tablet Take 10 mg by mouth as needed for migraine. May repeat in 2 hours if needed   Yes [provider]  rosuvastatin  (CRESTOR ) 10 MG tablet Take 1 tablet (10 mg total) by mouth daily. Patient taking differently: Take 10 mg by mouth at bedtime. 04/04/24  Yes Thukkani, Arun K, MD  sevelamer  carbonate (RENVELA ) 800 MG tablet Take 3,200 mg by mouth 3 (three) times daily with meals. 07/28/23  Yes [provider]  torsemide  (DEMADEX ) 20 MG tablet Take 40 mg by mouth See admin instructions. Take 40 mg daily on non-dialysis Sun, Mon, Wed, and Fri   Yes [provider]  Blood Glucose Monitoring Suppl DEVI 1 each by Does not apply route 3 (three) times daily. May dispense any manufacturer covered by patient's insurance. 11/03/23   Maree, Pratik D, DO  Continuous Blood Gluc Sensor MISC 1 each by Does not apply route as directed. Use as directed every 14 days. May dispense FreeStyle [employer] or similar.    [provider]  doxycycline  (MONODOX ) 100 MG capsule Take 100 mg by mouth 2 (two) times daily. Patient not taking: Reported on 04/11/2024 03/26/24   [provider]  Glucose Blood (BLOOD GLUCOSE TEST STRIPS) STRP 1 each by Does not apply route 3 (three) times daily. Use as directed to check blood sugar. May dispense any manufacturer covered by patient's insurance and fits patient's device. 11/03/23   Maree, Pratik D, DO  insulin  aspart (NOVOLOG ) 100 UNIT/ML FlexPen If eating and Blood Glucose (BG) 80 or higher inject 0 units for meal coverage and add correction dose per scale. If not eating,  correction dose only. BG <150= 0 unit; BG 150-200= 1 unit; BG 201-250= 2 unit; BG 251-300= 3 unit; BG 301-350= 4 unit; BG 351-400= 5 unit; BG >400= 6 unit and Call Primary Care. (When not using pump) Patient not taking: Reported on 04/11/2024 11/03/23   Maree Bracken D, DO  insulin  degludec (TRESIBA ) 100 UNIT/ML FlexTouch Pen Inject 50 Units into the skin daily. May substitute as needed per insurance. When not using pump. Patient not taking: Reported on 04/11/2024 11/03/23   Maree Bracken D, DO  Insulin  Pen Needle (PEN NEEDLES) 31G X 5 MM MISC 1 each by Does not apply route 3 (three) times daily. May dispense any manufacturer covered by patient's insurance. 11/03/23   Maree Bracken D, DO  Lancet Device MISC 1 each by Does not apply route 3 (three) times daily. May dispense any manufacturer covered by patient's insurance. 11/03/23   Maree Bracken D, DO  Lancets MISC 1 each by Does not apply route 3 (three) times daily. Use as directed to check blood sugar. May dispense any manufacturer covered by patient's insurance and fits patient's device. 11/03/23   Maree Bracken D, DO    Physical Exam: BP 138/61   Pulse 72   Temp 97.8 F (36.6 C)   Resp 14   Ht 5' 9 (1.753 m)   Wt 121 kg   LMP 03/23/2017 (Approximate)   SpO2 96%   BMI 39.39 kg/m   General: 53 y.o. year-old female well developed well nourished in no acute distress.  Alert and oriented x3. Cardiovascular: Regular rate and rhythm with no rubs or gallops.  No thyromegaly or JVD noted.  No lower extremity edema. 2/4 pulses in all 4  extremities. Respiratory: Clear to auscultation with no wheezes or rales. Good inspiratory effort. Abdomen: Soft nontender nondistended with normal bowel sounds x4 quadrants. Muskuloskeletal: No cyanosis, clubbing or edema noted bilaterally Neuro: CN II-XII intact, strength, sensation, reflexes Skin: No ulcerative lesions noted or rashes Psychiatry: Judgement and insight appear normal. Mood is appropriate for condition  and setting          Labs on Admission:  Basic Metabolic Panel: Recent Labs  Lab 04/11/24 1500 04/11/24 1721  NA 131*  --   K 7.4* 6.5*  CL 94*  --   CO2 22  --   GLUCOSE 449*  --   BUN 48*  --   CREATININE 5.28*  --   CALCIUM  9.6  --    Liver Function Tests: No results for input(s): AST, ALT, ALKPHOS, BILITOT, PROT, ALBUMIN in the last 168 hours. No results for input(s): LIPASE, AMYLASE in the last 168 hours. No results for input(s): AMMONIA in the last 168 hours. CBC: Recent Labs  Lab 04/11/24 1500  WBC 13.4*  NEUTROABS 10.9*  HGB 11.9*  HCT 37.7  MCV 96.7  PLT 263   Cardiac Enzymes: No results for input(s): CKTOTAL, CKMB, CKMBINDEX, TROPONINI in the last 168 hours.  BNP (last 3 results) No results for input(s): BNP in the last 8760 hours.  ProBNP (last 3 results) Recent Labs    04/11/24 1500  PROBNP 6,179.0*    CBG: Recent Labs  Lab 04/11/24 1449 04/11/24 1820  GLUCAP 437* 358*    Radiological Exams on Admission: DG Chest Portable 1 View Result Date: 04/11/2024 CLINICAL DATA:  Shortness of breath, hypoglycemia EXAM: PORTABLE CHEST 1 VIEW COMPARISON:  04/01/2024 FINDINGS: The heart size and mediastinal contours are within normal limits. Both lungs are clear. The visualized skeletal structures are unremarkable. IMPRESSION: No active disease. Electronically Signed   By: Ozell Daring M.D.   On: 04/11/2024 15:30    EKG: I independently viewed the EKG done and my findings are as followed: Normal sinus rhythm rate of 67.  Nonspecific ST-T changes.  QTc 445.  Assessment/Plan Present on Admission:  Hyperglycemia  Principal Problem:   Hyperglycemia  Type 1 diabetes, uncontrolled, with hyperglycemia Insulin  pump malfunctioning, POA Last hemoglobin A1c was 6.7 from 09/09/2021 Follow update hemoglobin A1c Started subcu insulin  long-acting and short acting Diabetes coordinator consulted  Hyperkalemia Serum potassium of 7.4  on presentation The patient received IV calcium  gluconate 1 g x 1, albuterol  neb 10 mg x 1, IV insulin  6 mg x 1, Lokelma 10 g x 1.  Added Lokelma 10 g 3 times daily x 3 doses until seen by nephrology.  Leukocytosis Presented with WBC 13.4 with neutrophilia Rule out active infective process Chest x-ray is nonacute UA with mild pyuria  Coronary artery disease Resume home regimen The patient is on DAPT and Crestor  prior to admission  Hypertension BPs are currently soft Hold off home oral antihypertensives to prevent hypotension Closely monitor vital signs Resume BP meds when blood pressures allow.  Chronic anxiety/depression Resume home regimen  OSA Resume home CPAP  Hypothyroidism Resume home levothyroxine     Time: 75 minutes.    DVT prophylaxis: Subcu heparin  3 times daily  Code Status: Full code  Family Communication: None at bedside.  Disposition Plan: Admitted to telemetry unit.  Consults called: Nephrology consulted by EDP.  Admission status: Observation status.   Status is: Observation    Jasmine LOISE Hurst MD [facility] Pager [phone]  If 7PM-7AM, please contact night-coverage [email or web address]  Password [email or web address]  04/11/2024, 7:22 PM

## 2024-04-11 NOTE — ED Provider Notes (Signed)
 [place] EMERGENCY DEPARTMENT AT [facility] - Inpatient Provider Note   CSN: [id number] Arrival date & time: 04/11/24  1433     Patient presents with: No chief complaint on file.   SHALAN NEAULT is a 53 y.o. female.  {Add pertinent medical, surgical, social history, OB history to HPI:7828} 53 year old female with a history of insulin -dependent diabetes and ESRD on TTH iHD who presents emergency department for elevated blood sugar.  Patient reports that last night she had a blood sugar that was undetectably high.  Says that she went to bed with her insulin  pump on and woke up this morning and her blood sugars dropping to 70.  Tried eating something and then her blood sugar started rising again into the 400s and decided to come into the emergency department for evaluation.  Is having increased shortness of breath, thirst, and urination.  Says that she has had URI type symptoms since the weather changed.  Also reports some mild shortness of breath and abdominal swelling that can be typical when she has too much fluid on her.  Has had this insulin  pump for a year and says that it has malfunctioned several times that there is currently a lawsuit going on against the manufacturer.  Her endocrinologist is aware of this.       Prior to Admission medications   Medication Sig Start Date End Date Taking? Authorizing Provider  acetaminophen  (TYLENOL ) 500 MG tablet Take 500-1,000 mg by mouth every 6 (six) hours as needed (pain.).    [provider]  albuterol  (VENTOLIN  HFA) 108 (90 Base) MCG/ACT inhaler INHALE ONE TO TWO PUFFS into THE lungs EVERY 6 HOURS AS NEEDED FOR WHEEZING OR SHORTNESS OF BREATH 03/20/24   Jude Harden GAILS, MD  aspirin  EC 81 MG tablet Take 1 tablet (81 mg total) by mouth daily with breakfast. Swallow whole. Stop taking Jun 06, 2024 03/07/24   Thukkani, Arun K, MD  Blood Glucose Monitoring Suppl DEVI 1 each by Does not apply route 3 (three) times daily. May dispense any  manufacturer covered by patient's insurance. 11/03/23   Maree, Pratik D, DO  buPROPion  (WELLBUTRIN  XL) 150 MG 24 hr tablet Take 1 tablet (150 mg total) by mouth daily. 02/25/24 08/23/24  Vickey Mettle, MD  Cholecalciferol  (VITAMIN D -3 PO) Take 1 tablet by mouth in the morning.    [provider]  clopidogrel  (PLAVIX ) 75 MG tablet Take 1 tablet (75 mg total) by mouth daily. 02/22/23   Vannie Reche RAMAN, NP  Continuous Blood Gluc Sensor MISC 1 each by Does not apply route as directed. Use as directed every 14 days. May dispense FreeStyle [employer] or similar.    [provider]  diclofenac  Sodium (VOLTAREN ) 1 % GEL Apply 2 g topically 4 (four) times daily as needed (pain.).    [provider]  diphenhydrAMINE  (BENADRYL ) 25 MG tablet Take 50 mg by mouth 2 (two) times daily as needed for allergies or sleep.    [provider]  DULoxetine  (CYMBALTA ) 30 MG capsule Take 1 capsule (30 mg total) by mouth daily. 02/08/24 06/07/24  Vickey Mettle, MD  EPINEPHrine  0.3 mg/0.3 mL IJ SOAJ injection Inject 0.3 mg into the muscle as needed for anaphylaxis. 12/10/23   [provider]  fluticasone  (FLONASE ) 50 MCG/ACT nasal spray Place 2 sprays into both nostrils daily.    [provider]  FOLIC ACID  PO Take 1 capsule by mouth daily.    [provider]  glucagon (GLUCAGEN  HYPOKIT)  1 MG SOLR injection Inject 1 mg into the skin once as needed for up to 1 dose for low blood sugar. GlucaGen  HypoKit 1 mg Injection 03/31/23   Johnson, Clanford L, MD  Glucose Blood (BLOOD GLUCOSE TEST STRIPS) STRP 1 each by Does not apply route 3 (three) times daily. Use as directed to check blood sugar. May dispense any manufacturer covered by patient's insurance and fits patient's device. 11/03/23   Maree, Pratik D, DO  hydrALAZINE  (APRESOLINE ) 25 MG tablet Take 3 tablets (75 mg total) by mouth 3 (three) times daily. Patient taking differently: Take 75 mg by mouth 3 (three) times  daily. On Sun., Mon., Wed., Fri. 01/09/24   Thukkani, Arun K, MD  insulin  aspart (NOVOLOG ) 100 UNIT/ML FlexPen If eating and Blood Glucose (BG) 80 or higher inject 0 units for meal coverage and add correction dose per scale. If not eating, correction dose only. BG <150= 0 unit; BG 150-200= 1 unit; BG 201-250= 2 unit; BG 251-300= 3 unit; BG 301-350= 4 unit; BG 351-400= 5 unit; BG >400= 6 unit and Call Primary Care. (When not using pump) 11/03/23   Maree, Pratik D, DO  insulin  degludec (TRESIBA ) 100 UNIT/ML FlexTouch Pen Inject 50 Units into the skin daily. May substitute as needed per insurance. When not using pump. 11/03/23   Maree, Pratik D, DO  Insulin  Pen Needle (PEN NEEDLES) 31G X 5 MM MISC 1 each by Does not apply route 3 (three) times daily. May dispense any manufacturer covered by patient's insurance. 11/03/23   Maree, Pratik D, DO  isosorbide  mononitrate (IMDUR ) 30 MG 24 hr tablet Take 1 tablet (30 mg total) by mouth at bedtime. 03/07/24   Thukkani, Arun K, MD  lamoTRIgine  (LAMICTAL ) 200 MG tablet Take 1 tablet (200 mg total) by mouth every evening. 10/21/23 04/18/24  Vickey Mettle, MD  Lancet Device MISC 1 each by Does not apply route 3 (three) times daily. May dispense any manufacturer covered by patient's insurance. 11/03/23   Maree Bracken D, DO  Lancets MISC 1 each by Does not apply route 3 (three) times daily. Use as directed to check blood sugar. May dispense any manufacturer covered by patient's insurance and fits patient's device. 11/03/23   Maree, Pratik D, DO  levothyroxine  (SYNTHROID ) 175 MCG tablet Take 175 mcg by mouth daily before breakfast. 11/20/21   [provider]  lidocaine -prilocaine (EMLA) cream Apply topically 3 (three) times a week. 02/20/24   [provider]  LORazepam  (ATIVAN ) 0.5 MG tablet Take 1 tablet (0.5 mg total) by mouth daily as needed for anxiety. 04/03/24 05/03/24  Vickey Mettle, MD  Melatonin 10 MG TABS Take 10 mg by mouth at bedtime.    [provider]  metoprolol  succinate (TOPROL  XL) 25 MG 24 hr tablet Take 0.5 tablets (12.5 mg total) by mouth at bedtime. 03/07/24   Thukkani, Arun K, MD  ondansetron  (ZOFRAN -ODT) 4 MG disintegrating tablet Take 4 mg by mouth every 8 (eight) hours as needed for nausea or vomiting. 03/17/22   [provider]  oxyCODONE -acetaminophen  (PERCOCET/ROXICET) 5-325 MG tablet 1 tablet every 8 (eight) hours as needed for moderate pain (pain score 4-6). For 15 days 01/30/24   [provider]  OXYGEN Inhale 3 L into the lungs at bedtime.    [provider]  predniSONE (DELTASONE) 10 MG tablet Take  40 mg by mouth  (4 tablets)x 2 days, them  20 mg (2  tablets) x 2 days then 10 mg ( one tablet)  x 2 days 12/13/23   [provider]  predniSONE (DELTASONE) 20 MG tablet 20 mg. 12/26/23   [provider]  PRESCRIPTION MEDICATION See admin instructions.  IDPN LV CUSTOM (100GM)   INFUSE 1 BAG DURING HD 3 TIMES WEEKLY AS FOLLOWS 1ST WEEK 55ML/HR; 2ND WEEK 110ML/HR; 3RD WEEK 163ML/HR; THEN CONTINUE WITH GOAL RATE= 163ML/HR OVER 3HRS . TITRATE AS TOLERATED. ADMINISTER IV VIA VENOUS DRIP CHAMBER.    [provider]  rizatriptan  (MAXALT ) 10 MG tablet Take 10 mg by mouth as needed for migraine. May repeat in 2 hours if needed    [provider]  rosuvastatin  (CRESTOR ) 10 MG tablet Take 1 tablet (10 mg total) by mouth daily. 04/04/24   Thukkani, Arun K, MD  sevelamer  carbonate (RENVELA ) 800 MG tablet Take 1,600 mg by mouth 3 (three) times daily with meals. 07/28/23   [provider]  torsemide  (DEMADEX ) 20 MG tablet Take 40 mg by mouth See admin instructions. Take 40 mg daily on non-dialysis Sun, Mon, Wed, and Fri    [provider]    Allergies: Ciprofloxacin , Levaquin [levofloxacin], Linaclotide, Promethazine , Buspar [buspirone], Advair diskus [fluticasone -salmeterol], Biaxin [clarithromycin], and Hydroxyzine    Review of Systems  Updated Vital  Signs Ht 5' 9 (1.753 m)   Wt 121 kg   LMP 03/23/2017 (Approximate)   BMI 39.39 kg/m   Physical Exam Vitals and nursing note reviewed.  Constitutional:      General: She is not in acute distress.    Appearance: She is well-developed.  HENT:     Head: Normocephalic and atraumatic.     Right Ear: External ear normal.     Left Ear: External ear normal.     Nose: Nose normal.  Eyes:     Extraocular Movements: Extraocular movements intact.     Conjunctiva/sclera: Conjunctivae normal.     Pupils: Pupils are equal, round, and reactive to light.  Cardiovascular:     Rate and Rhythm: Normal rate and regular rhythm.     Heart sounds: No murmur heard. Pulmonary:     Effort: Pulmonary effort is normal. No respiratory distress.     Breath sounds: Normal breath sounds.  Abdominal:     General: Abdomen is flat. There is no distension.     Palpations: Abdomen is soft. There is no mass.     Tenderness: There is no abdominal tenderness. There is no guarding.  Musculoskeletal:     Cervical back: Normal range of motion and neck supple.     Right lower leg: No edema.     Left lower leg: No edema.  Skin:    General: Skin is warm and dry.  Neurological:     Mental Status: She is alert and oriented to person, place, and time. Mental status is at baseline.  Psychiatric:        Mood and Affect: Mood normal.     (all labs ordered are listed, but only abnormal results are displayed) Labs Reviewed  CBG MONITORING, ED - Abnormal; Notable for the following components:      Result Value   Glucose-Capillary 437 (*)    All other components within normal limits  RESP PANEL BY RT-PCR (RSV, FLU A&B, COVID)  [id number]  BASIC METABOLIC PANEL WITH GFR  BASIC METABOLIC PANEL WITH GFR  BASIC METABOLIC PANEL WITH GFR  BASIC METABOLIC PANEL WITH GFR  BETA-HYDROXYBUTYRIC ACID  BETA-HYDROXYBUTYRIC ACID  BETA-HYDROXYBUTYRIC ACID  BETA-HYDROXYBUTYRIC ACID  CBC WITH DIFFERENTIAL/PLATELET  URINALYSIS, ROUTINE  W REFLEX MICROSCOPIC  BLOOD GAS, VENOUS  PRO BRAIN NATRIURETIC PEPTIDE  CBG MONITORING, ED    EKG: None  Radiology: No results found.  {Document cardiac monitor, telemetry assessment procedure when appropriate:32947} Procedures   Medications Ordered in the ED - No data to display    {Click here for ABCD2, HEART and other calculators REFRESH Note before signing:1}                              Medical Decision Making Amount and/or Complexity of Data Reviewed Labs: ordered. Radiology: ordered.  Risk OTC drugs. Prescription drug management.   53 year old female with history of insulin -dependent diabetes presents emergency department with elevated blood sugar  Initial Ddx:  DKA, HHS, hyperglycemia, URI, CHF, pneumonia  MDM/Course:  3:33 PM Patient presents emergency department elevated blood sugar last night.  Sounds like it is quite labile as it dipped to 70 this morning and then is back up in the 400s right now.  Sounds like she is been having some shortness of breath and URI symptoms recently.  [place] that she is also got some abdominal swelling is typical when she developed CHF and is having some shortness of breath.  It appears that her insulin  pump has been malfunctioning quite a bit and I suspect that that is likely the etiology of what is going on today.  Will send off blood work to ensure that she is not in DKA.  Will obtain chest x-ray to rule out pneumonia or volume overload along with a BNP.  Will also send a COVID and flu with her URI type symptoms.   *** Upon re-evaluation ***  This patient presents to the ED for concern of complaints listed in HPI, this involves an extensive number of treatment options, and is a complaint that carries with it a high risk of complications and morbidity. Disposition including potential need for admission considered.   Dispo: {Disposition:28069}  Additional history obtained from {Additional History:28067} Records reviewed {Records  Reviewed:28068} The following labs were independently interpreted: {labs interpreted:28064} and show {lab findings:28250} I independently reviewed the following imaging with scope of interpretation limited to determining acute life threatening conditions related to emergency care: {imaging interpreted:28065} and agree with the radiologist interpretation with the following exceptions: none I personally reviewed and interpreted cardiac monitoring: {cardiac monitoring:28251} I personally reviewed and interpreted the pt's EKG: see above for interpretation  I have reviewed the patients home medications and made adjustments as needed Consults: {Consultants:28063} Social Determinants of health:  ***  Portions of this note were generated with Scientist, clinical (histocompatibility and immunogenetics). Dictation errors may occur despite best attempts at proofreading.     Final diagnoses:  None    ED Discharge Orders     None

## 2024-04-11 NOTE — ED Notes (Signed)
 AC aware needing Semglee .

## 2024-04-11 NOTE — ED Notes (Signed)
 Per lab, another order placed for VBG as the one sent to lab was clotted

## 2024-04-12 ENCOUNTER — Inpatient Hospital Stay (HOSPITAL_COMMUNITY)

## 2024-04-12 ENCOUNTER — Other Ambulatory Visit (HOSPITAL_COMMUNITY): Payer: Self-pay | Admitting: *Deleted

## 2024-04-12 DIAGNOSIS — Z7902 Long term (current) use of antithrombotics/antiplatelets: Secondary | ICD-10-CM | POA: Diagnosis not present

## 2024-04-12 DIAGNOSIS — E7800 Pure hypercholesterolemia, unspecified: Secondary | ICD-10-CM | POA: Diagnosis present

## 2024-04-12 DIAGNOSIS — J45909 Unspecified asthma, uncomplicated: Secondary | ICD-10-CM | POA: Diagnosis present

## 2024-04-12 DIAGNOSIS — G4733 Obstructive sleep apnea (adult) (pediatric): Secondary | ICD-10-CM | POA: Diagnosis present

## 2024-04-12 DIAGNOSIS — T85614A Breakdown (mechanical) of insulin pump, initial encounter: Secondary | ICD-10-CM | POA: Diagnosis present

## 2024-04-12 DIAGNOSIS — Y742 Prosthetic and other implants, materials and accessory general hospital and personal-use devices associated with adverse incidents: Secondary | ICD-10-CM | POA: Diagnosis present

## 2024-04-12 DIAGNOSIS — Z9981 Dependence on supplemental oxygen: Secondary | ICD-10-CM | POA: Diagnosis not present

## 2024-04-12 DIAGNOSIS — E1022 Type 1 diabetes mellitus with diabetic chronic kidney disease: Secondary | ICD-10-CM | POA: Diagnosis present

## 2024-04-12 DIAGNOSIS — Z992 Dependence on renal dialysis: Secondary | ICD-10-CM | POA: Diagnosis not present

## 2024-04-12 DIAGNOSIS — E1065 Type 1 diabetes mellitus with hyperglycemia: Secondary | ICD-10-CM | POA: Diagnosis present

## 2024-04-12 DIAGNOSIS — Z6841 Body Mass Index (BMI) 40.0 and over, adult: Secondary | ICD-10-CM | POA: Diagnosis not present

## 2024-04-12 DIAGNOSIS — Z1152 Encounter for screening for COVID-19: Secondary | ICD-10-CM | POA: Diagnosis not present

## 2024-04-12 DIAGNOSIS — E875 Hyperkalemia: Secondary | ICD-10-CM | POA: Diagnosis present

## 2024-04-12 DIAGNOSIS — I132 Hypertensive heart and chronic kidney disease with heart failure and with stage 5 chronic kidney disease, or end stage renal disease: Secondary | ICD-10-CM | POA: Diagnosis present

## 2024-04-12 DIAGNOSIS — Z794 Long term (current) use of insulin: Secondary | ICD-10-CM | POA: Diagnosis not present

## 2024-04-12 DIAGNOSIS — E669 Obesity, unspecified: Secondary | ICD-10-CM | POA: Diagnosis present

## 2024-04-12 DIAGNOSIS — R739 Hyperglycemia, unspecified: Secondary | ICD-10-CM | POA: Diagnosis not present

## 2024-04-12 DIAGNOSIS — F419 Anxiety disorder, unspecified: Secondary | ICD-10-CM | POA: Diagnosis present

## 2024-04-12 DIAGNOSIS — F319 Bipolar disorder, unspecified: Secondary | ICD-10-CM | POA: Diagnosis present

## 2024-04-12 DIAGNOSIS — I700 Atherosclerosis of aorta: Secondary | ICD-10-CM | POA: Diagnosis present

## 2024-04-12 DIAGNOSIS — I5033 Acute on chronic diastolic (congestive) heart failure: Secondary | ICD-10-CM | POA: Diagnosis present

## 2024-04-12 DIAGNOSIS — N186 End stage renal disease: Secondary | ICD-10-CM | POA: Diagnosis present

## 2024-04-12 DIAGNOSIS — D631 Anemia in chronic kidney disease: Secondary | ICD-10-CM | POA: Diagnosis present

## 2024-04-12 DIAGNOSIS — D72829 Elevated white blood cell count, unspecified: Secondary | ICD-10-CM | POA: Diagnosis present

## 2024-04-12 DIAGNOSIS — E039 Hypothyroidism, unspecified: Secondary | ICD-10-CM | POA: Diagnosis present

## 2024-04-12 DIAGNOSIS — I2510 Atherosclerotic heart disease of native coronary artery without angina pectoris: Secondary | ICD-10-CM | POA: Diagnosis present

## 2024-04-12 LAB — MRSA NEXT GEN BY PCR, NASAL: MRSA by PCR Next Gen: NOT DETECTED

## 2024-04-12 LAB — RENAL FUNCTION PANEL
Albumin: 3.3 g/dL — ABNORMAL LOW (ref 3.5–5.0)
Anion gap: 15 (ref 5–15)
BUN: 59 mg/dL — ABNORMAL HIGH (ref 6–20)
CO2: 25 mmol/L (ref 22–32)
Calcium: 9.1 mg/dL (ref 8.9–10.3)
Chloride: 94 mmol/L — ABNORMAL LOW (ref 98–111)
Creatinine, Ser: 6.33 mg/dL — ABNORMAL HIGH (ref 0.44–1.00)
GFR, Estimated: 7 mL/min — ABNORMAL LOW (ref >60)
Glucose, Bld: 405 mg/dL — ABNORMAL HIGH (ref 70–99)
Phosphorus: 5 mg/dL — ABNORMAL HIGH (ref 2.5–4.6)
Potassium: 5.9 mmol/L — ABNORMAL HIGH (ref 3.5–5.1)
Sodium: 134 mmol/L — ABNORMAL LOW (ref 135–145)

## 2024-04-12 LAB — TROPONIN T, HIGH SENSITIVITY
Troponin T High Sensitivity: 92 ng/L — ABNORMAL HIGH (ref 0–19)
Troponin T High Sensitivity: 98 ng/L — ABNORMAL HIGH (ref 0–19)

## 2024-04-12 LAB — CBC WITH DIFFERENTIAL/PLATELET
Abs Immature Granulocytes: 0.02 K/uL (ref 0.00–0.07)
Basophils Absolute: 0.1 K/uL (ref 0.0–0.1)
Basophils Relative: 1 %
Eosinophils Absolute: 0 K/uL (ref 0.0–0.5)
Eosinophils Relative: 0 %
HCT: 31.8 % — ABNORMAL LOW (ref 36.0–46.0)
Hemoglobin: 9.9 g/dL — ABNORMAL LOW (ref 12.0–15.0)
Immature Granulocytes: 0 %
Lymphocytes Relative: 33 %
Lymphs Abs: 2.3 K/uL (ref 0.7–4.0)
MCH: 30.6 pg (ref 26.0–34.0)
MCHC: 31.1 g/dL (ref 30.0–36.0)
MCV: 98.1 fL (ref 80.0–100.0)
Monocytes Absolute: 0.9 K/uL (ref 0.1–1.0)
Monocytes Relative: 13 %
Neutro Abs: 3.7 K/uL (ref 1.7–7.7)
Neutrophils Relative %: 53 %
Platelets: 237 K/uL (ref 150–400)
RBC: 3.24 MIL/uL — ABNORMAL LOW (ref 3.87–5.11)
RDW: 14.3 % (ref 11.5–15.5)
WBC: 7 K/uL (ref 4.0–10.5)
nRBC: 0 % (ref 0.0–0.2)

## 2024-04-12 LAB — GLUCOSE, CAPILLARY
Glucose-Capillary: 483 mg/dL — ABNORMAL HIGH (ref 70–99)
Glucose-Capillary: 349 mg/dL — ABNORMAL HIGH (ref 70–99)
Glucose-Capillary: 228 mg/dL — ABNORMAL HIGH (ref 70–99)

## 2024-04-12 MED ORDER — LIDOCAINE-PRILOCAINE 2.5-2.5 % EX CREA: 1.0000 | TOPICAL_CREAM | CUTANEOUS | Status: DC | PRN

## 2024-04-12 MED ORDER — CEPHALEXIN 500 MG PO CAPS: 500.0000 mg | ORAL_CAPSULE | ORAL | 0 refills | Status: AC

## 2024-04-12 MED ORDER — MIDODRINE HCL 5 MG PO TABS
20.0000 mg | ORAL_TABLET | ORAL | Status: DC
  Filled 2024-04-12: qty 4

## 2024-04-12 MED ORDER — PENTAFLUOROPROP-TETRAFLUOROETH EX AERO
1.0000 | INHALATION_SPRAY | CUTANEOUS | Status: DC | PRN
  Administered 2024-04-12: 1 via TOPICAL
  Filled 2024-04-12: qty 30

## 2024-04-12 MED ORDER — LIDOCAINE HCL (PF) 1 % IJ SOLN: 5.0000 mL | INTRAMUSCULAR | Status: DC | PRN

## 2024-04-12 MED ORDER — ANTICOAGULANT SODIUM CITRATE 4% (200MG/5ML) IV SOLN: 5.0000 mL | Status: DC | PRN

## 2024-04-12 MED ORDER — INSULIN GLARGINE-YFGN 100 UNIT/ML ~~LOC~~ SOLN
20.0000 [IU] | Freq: Two times a day (BID) | SUBCUTANEOUS | Status: DC
  Filled 2024-04-12 (×2): qty 0.2

## 2024-04-12 MED ORDER — INSULIN ASPART 100 UNIT/ML IJ SOLN
0.0000 [IU] | INTRAMUSCULAR | Status: DC
  Filled 2024-04-12: qty 0.09

## 2024-04-12 MED ORDER — INSULIN ASPART 100 UNIT/ML IJ SOLN
3.0000 [IU] | Freq: Three times a day (TID) | INTRAMUSCULAR | Status: DC
  Filled 2024-04-12: qty 0.03

## 2024-04-12 MED ORDER — MIDODRINE HCL 5 MG PO TABS: 10.0000 mg | ORAL_TABLET | Freq: Three times a day (TID) | ORAL | Status: DC | PRN

## 2024-04-12 MED ORDER — SODIUM CHLORIDE 0.9 % IV SOLN
1.0000 g | INTRAVENOUS | Status: DC
  Administered 2024-04-12: 1 g via INTRAVENOUS
  Filled 2024-04-12: qty 10

## 2024-04-12 MED ORDER — HEPARIN SODIUM (PORCINE) 1000 UNIT/ML IJ SOLN
INTRAMUSCULAR | Status: AC
  Filled 2024-04-12: qty 5

## 2024-04-12 MED ORDER — METOCLOPRAMIDE HCL 5 MG/ML IJ SOLN
10.0000 mg | Freq: Four times a day (QID) | INTRAMUSCULAR | Status: DC | PRN
  Administered 2024-04-12: 10 mg via INTRAVENOUS
  Filled 2024-04-12: qty 2

## 2024-04-12 MED ORDER — HEPARIN SODIUM (PORCINE) 1000 UNIT/ML DIALYSIS: 1000.0000 [IU] | INTRAMUSCULAR | Status: DC | PRN

## 2024-04-12 MED ORDER — HEPARIN SODIUM (PORCINE) 1000 UNIT/ML DIALYSIS
40.0000 [IU]/kg | INTRAMUSCULAR | Status: DC | PRN
  Administered 2024-04-12: 4800 [IU] via INTRAVENOUS_CENTRAL

## 2024-04-12 MED ORDER — INSULIN ASPART 100 UNIT/ML IJ SOLN
20.0000 [IU] | Freq: Once | INTRAMUSCULAR | Status: AC
  Administered 2024-04-12: 20 [IU] via SUBCUTANEOUS

## 2024-04-12 MED ORDER — ALTEPLASE 2 MG IJ SOLR: 2.0000 mg | Freq: Once | INTRAMUSCULAR | Status: DC | PRN

## 2024-04-12 MED ORDER — CEPHALEXIN 500 MG PO CAPS
500.0000 mg | ORAL_CAPSULE | ORAL | Status: DC
  Filled 2024-04-12: qty 1

## 2024-04-12 NOTE — Progress Notes (Signed)
 Received report from [facility] RN in dialysis when Jasmine [name] and she will be discharged when arriving back on the unit from her treatment today.

## 2024-04-12 NOTE — Procedures (Signed)
 Received patient in bed to unit.  Alert and oriented.  Informed consent signed and in chart.  All procedures explained. LUA AVF cannulated x 2 with 16g needles per policy, without difficulty., secured well with tape. Tx initiated per MD order.  TX duration:3.5 hours  Tx complete. Blood returned. Needles removed, sites held x 2 until hemostasis achieved. Gauze changed prior to taping.  Patient tolerated well.  Transported back to the room  Alert, without acute distress.  Hand-off given to patient's nurse.   Access used: LUA AVF Access issues: none  Total UF removed: 2000 ml Medication(s) given: See MAR   [facility]

## 2024-04-12 NOTE — Plan of Care (Signed)
   Problem: Activity: Goal: Risk for activity intolerance will decrease Outcome: Progressing   Problem: Coping: Goal: Level of anxiety will decrease Outcome: Progressing

## 2024-04-12 NOTE — Discharge Summary (Signed)
 Physician Discharge Summary   Patient: Jasmine [name] MRN: [id number] DOB: [birth year]  Admit date:     04/11/2024  Discharge date: 04/12/24  Discharge Physician: Bernardino KATHEE Come   PCP: Debrah Josette ORN., PA-C   Recommendations at discharge:  Follow up with endocrinology after discharge for continued insulin  pump titration.  Continue routine HD on TTS schedule, received HD in hospital 11/6.  Discharge Diagnoses: Principal Problem:   Hyperglycemia  Hospital Course: HPI: Jasmine [name] is a 53 y.o. female with medical history significant for ESRD on hemodialysis TTS (started hemodialysis in August 2024), type 1 diabetes with insulin  pump, coronary artery disease, chronic HFpEF, aortic atherosclerosis, hypertension, hyperlipidemia, chronic anxiety/depression, hypothyroidism, OSA on CPAP, obesity, who presented to the ER due to hyperglycemia and insulin  pump malfunctioning for the past few days.     Also endorses chest tightness over the weekend.  On Monday, 3 days ago, she called her cardiologist due to her chest pain.  The following day, she presented to her nephrologist and was hemodialyzed, she also told them about her chest pain.  Both providers recommended to go to the ER for further evaluation.  States this morning her body was cramping all over and it felt like she was having a DKA attack.     Also endorses recently treated for sinus infection and completed 10-day course of antibiotics, doxycycline .     She describes her chest pain as centrally located with tightness, nonradiating and associated with shortness of breath.  Lasting about 2 hours, 6 out of 10 in severity.   In the ER, upon arrival, CBG over 400.  Lab studies notable for serum glucose 449, serum potassium 7.4, serum bicarb 22 with anion gap of 15.   The patient received IV calcium  gluconate 1 g x 1, albuterol  neb 10 mg x 1, IV insulin  6 mg x 1, Lokelma 10 g x 1.  EDP discussed the case with nephrology for possible  hemodialysis during this admission.     ProBNP greater than 6100 with trace edema in lower extremities bilaterally.  No evidence of acute ischemia on twelve-lead EKG.  High-sensitivity troponin ordered and is pending.   Admitted by [facility], hospitalist service.    Assessment and Plan: Type 1 diabetes, uncontrolled, with hyperglycemia Insulin  pump malfunctioning, POA - Adjusting subcutaneous insulin , symptoms improved, glycemic control improved.  Started subcu insulin  long-acting and short acting insulin . Diabetes coordinator consulted   Hyperkalemia Serum potassium of 7.4 on presentation, due to hyperglycemia, improving prior to dialysis, no ECG changes. Suggest recheck at follow up.    Chest pain: Pt actually denies this on 11/6, stating that she just felt discomfort all over as though she were heading toward DKA. Symptoms have resolved this morning. No dyspnea, or angina at this time. troponin under previous elevations in this ESRD patient. BNP not reliable either. ECG nonischemic.  - Outpatient follow up.  Follow high-sensitivity troponin No evidence of acute ischemia on twelve-lead EKG. Continue to closely monitor on telemetry. Follow repeat transthoracic echo   Acute on chronic HFpEF: Last 2D echo done on 10/31/2023 revealed LVEF 55% The patient is on diuretics, torsemide  - Volume to be managed with diuretics and dialysis.     Leukocytosis Presented with WBC 13.4 with neutrophilia Rule out active infective process Chest x-ray is nonacute UA with mild pyuria, reports she's had UTIs when checked in this circumstance before, had hyperglycemia unexplained before pump malfunctioned. Will monitor urine culture and complete antibiotics with keflex  500mg  q24h (to  be given after dialysis on HD days). discharging physician will monitor urine culture data.     Coronary artery disease Resume home regimen The patient is on DAPT and Crestor  prior to admission   Hypertension: Continue home Tx    Chronic anxiety/depression Resume home regimen   OSA Resume home CPAP   Hypothyroidism Resume home levothyroxine   Consultants: Nephrology Procedures performed: HD 11/6 on schedule  Disposition: Home Diet recommendation:  Renal diet DISCHARGE MEDICATION: Allergies as of 04/12/2024       Reactions   Ciprofloxacin  Nausea Only, Swelling, Dermatitis, Other (See Comments)   Per pt caused lips swell and nauseous feeling   Levofloxacin Other (See Comments), Swelling, Nausea Only   Per pt caused lips swell and nauseous feeling   Linaclotide Nausea Only, Other (See Comments)   Cause severe dehydration   Promethazine  Other (See Comments)   Completely wipes out/fatigue   Buspar [buspirone] Other (See Comments)   Abd cramping   Advair Diskus [fluticasone -salmeterol] Other (See Comments)   Thrush    Clarithromycin Dermatitis, Rash   Hydroxyzine Palpitations   Ondansetron  Hcl Other (See Comments)   Makes pt more nauseous and drunk        Medication List     STOP taking these medications    doxycycline  100 MG capsule Commonly known as: MONODOX        TAKE these medications    acetaminophen  500 MG tablet Commonly known as: TYLENOL  Take 1,000 mg by mouth every 6 (six) hours as needed (pain.).   albuterol  108 (90 Base) MCG/ACT inhaler Commonly known as: VENTOLIN  HFA INHALE ONE TO TWO PUFFS into THE lungs EVERY 6 HOURS AS NEEDED FOR WHEEZING OR SHORTNESS OF BREATH What changed: See the new instructions.   aspirin  EC 81 MG tablet Take 1 tablet (81 mg total) by mouth daily with breakfast. Swallow whole. Stop taking Jun 06, 2024 What changed: when to take this   Blood Glucose Monitoring Suppl Devi 1 each by Does not apply route 3 (three) times daily. May dispense any manufacturer covered by patient's insurance.   BLOOD GLUCOSE TEST STRIPS Strp 1 each by Does not apply route 3 (three) times daily. Use as directed to check blood sugar. May dispense any manufacturer  covered by patient's insurance and fits patient's device.   buPROPion  150 MG 24 hr tablet Commonly known as: WELLBUTRIN  XL Take 1 tablet (150 mg total) by mouth daily.   cephALEXin  500 MG capsule Commonly known as: KEFLEX  Take 1 capsule (500 mg total) by mouth daily. after dialysis on HD days   clopidogrel  75 MG tablet Commonly known as: PLAVIX  Take 1 tablet (75 mg total) by mouth daily.   Continuous Blood Gluc Sensor Misc 1 each by Does not apply route as directed. Use as directed every 14 days. May dispense FreeStyle [employer] or similar.   DULoxetine  30 MG capsule Commonly known as: CYMBALTA  Take 1 capsule (30 mg total) by mouth daily.   EPINEPHrine  0.3 mg/0.3 mL Soaj injection Commonly known as: EPI-PEN Inject 0.3 mg into the muscle as needed for anaphylaxis.   fluticasone  50 MCG/ACT nasal spray Commonly known as: FLONASE  Place 2 sprays into both nostrils daily as needed for rhinitis or allergies (sinus headache).   FOLIC ACID  PO Take 1 capsule by mouth daily.   GlucaGen  HypoKit 1 MG Solr Generic drug: Glucagon HCl Inject 1 mg into the skin once as needed for up to 1 dose for low blood sugar. GlucaGen  HypoKit 1 mg Injection  hydrALAZINE  25 MG tablet Commonly known as: APRESOLINE  Take 3 tablets (75 mg total) by mouth 3 (three) times daily. What changed:  when to take this additional instructions   NovoLOG  100 UNIT/ML injection Generic drug: insulin  aspart Inject 120 Units into the skin 2 days. Via insulin  pump   insulin  aspart 100 UNIT/ML FlexPen Commonly known as: NOVOLOG  If eating and Blood Glucose (BG) 80 or higher inject 0 units for meal coverage and add correction dose per scale. If not eating, correction dose only. BG <150= 0 unit; BG 150-200= 1 unit; BG 201-250= 2 unit; BG 251-300= 3 unit; BG 301-350= 4 unit; BG 351-400= 5 unit; BG >400= 6 unit and Call Primary Care. (When not using pump)   insulin  degludec 100 UNIT/ML FlexTouch Pen Commonly  known as: TRESIBA  Inject 50 Units into the skin daily. May substitute as needed per insurance. When not using pump.   isosorbide  mononitrate 30 MG 24 hr tablet Commonly known as: IMDUR  Take 1 tablet (30 mg total) by mouth at bedtime. What changed: when to take this   lamoTRIgine  200 MG tablet Commonly known as: LAMICTAL  Take 1 tablet (200 mg total) by mouth every evening.   Lancet Device Misc 1 each by Does not apply route 3 (three) times daily. May dispense any manufacturer covered by patient's insurance.   Lancets Misc 1 each by Does not apply route 3 (three) times daily. Use as directed to check blood sugar. May dispense any manufacturer covered by patient's insurance and fits patient's device.   levothyroxine  175 MCG tablet Commonly known as: SYNTHROID  Take 175 mcg by mouth daily before breakfast.   lidocaine -prilocaine cream Commonly known as: EMLA Apply topically 3 (three) times a week.   LORazepam  0.5 MG tablet Commonly known as: ATIVAN  Take 1 tablet (0.5 mg total) by mouth daily as needed for anxiety.   Melatonin 10 MG Tabs Take 10 mg by mouth at bedtime.   metoprolol  succinate 25 MG 24 hr tablet Commonly known as: Toprol  XL Take 0.5 tablets (12.5 mg total) by mouth at bedtime.   ondansetron  4 MG disintegrating tablet Commonly known as: ZOFRAN -ODT Take 4 mg by mouth every 8 (eight) hours as needed for nausea or vomiting.   oxyCODONE -acetaminophen  5-325 MG tablet Commonly known as: PERCOCET/ROXICET Take 1 tablet by mouth every 8 (eight) hours as needed for moderate pain (pain score 4-6). For 15 days   OXYGEN Inhale 3 L into the lungs at bedtime.   Pen Needles 31G X 5 MM Misc 1 each by Does not apply route 3 (three) times daily. May dispense any manufacturer covered by patient's insurance.   rizatriptan  10 MG tablet Commonly known as: MAXALT  Take 10 mg by mouth as needed for migraine. May repeat in 2 hours if needed   rosuvastatin  10 MG tablet Commonly  known as: CRESTOR  Take 1 tablet (10 mg total) by mouth daily. What changed: when to take this   sevelamer  carbonate 800 MG tablet Commonly known as: RENVELA  Take 3,200 mg by mouth 3 (three) times daily with meals.   torsemide  20 MG tablet Commonly known as: DEMADEX  Take 40 mg by mouth See admin instructions. Take 40 mg daily on non-dialysis Sun, Mon, Wed, and Fri   VITAMIN D -3 PO Take 1 tablet by mouth daily.        Follow-up Information     Debrah Josette ORN., PA-C Follow up.   Specialty: Family Medicine Contact information: [address] [place] [address] [phone]  Discharge Exam: Filed Weights   04/11/24 1444 04/11/24 2026 04/12/24 0956  Weight: 121 kg 122.7 kg 123.3 kg  No distress, well-appearing obese female Clear, nonlabored RRR, no MRG, trace edema in LEs, no JVD Soft, NT, ND, +BS  Condition at discharge: stable  The results of significant diagnostics from this hospitalization (including imaging, microbiology, ancillary and laboratory) are listed below for reference.   Imaging Studies: DG Chest Portable 1 View Result Date: 04/11/2024 CLINICAL DATA:  Shortness of breath, hypoglycemia EXAM: PORTABLE CHEST 1 VIEW COMPARISON:  04/01/2024 FINDINGS: The heart size and mediastinal contours are within normal limits. Both lungs are clear. The visualized skeletal structures are unremarkable. IMPRESSION: No active disease. Electronically Signed   By: Ozell Daring M.D.   On: 04/11/2024 15:30   IR PATIENT EVAL TECH 0-60 MINS Result Date: 04/04/2024 Candie Tawni BRAVO, RT     04/04/2024 12:56 PM Patients name and DOB were verified. Patient pulled out HD catheter over the weekend and was told to still come to her appointment. I brought the patient back to redress the catheter site. Site was dressed with gauze and tegaderm.  DG Chest 1 View Result Date: 04/01/2024 CLINICAL DATA:  Catheter retracted. Patient reports dialysis catheter  came out of left chest. EXAM: CHEST  1 VIEW COMPARISON:  Radiograph 02/21/2024 FINDINGS: The previous left-sided dialysis catheter is no longer seen. The heart is mildly enlarged. Diffuse interstitial and bronchial thickening. Elevated right hemidiaphragm. No pneumothorax. No large pleural effusion. IMPRESSION: 1. Previous left-sided dialysis catheter is no longer seen. 2. Diffuse interstitial and bronchial thickening, may be pulmonary edema or atypical infection. Electronically Signed   By: Andrea Gasman M.D.   On: 04/01/2024 12:49   LONG TERM MONITOR (3-14 DAYS) Result Date: 03/26/2024 Patch Wear Time:  7 days and 10 hours (2025-10-03T10:01:57-0400 to 2025-10-10T20:06:14-0400) Patient had a min HR of 55 bpm, max HR of 158 bpm, and avg HR of 69 bpm. Predominant underlying rhythm was Sinus Rhythm. EVENTS: -1 run of Ventricular Tachycardia occurred lasting 5 beats with a max rate of 158 bpm (avg 149 bpm). -1 run of Supraventricular Tachycardia occurred lasting 6 beats with a max rate of 150 bpm (avg 121 bpm). Supraventricular Tachycardia was detected within +/- 45 seconds of symptomatic patient event(s). -Isolated SVEs were rare (<1.0%), SVE Couplets were rare (<1.0%), and SVE Triplets were rare (<1.0%). -Isolated VEs were rare (<1.0%), and no VE Couplets or VE Triplets were present. -No atrial fibrillation, sustained ventricular tachyarrhythmias, or bradyarrhythmias were detected. -Patient triggered events corresponded with SVT.    Microbiology: Results for orders placed or performed during the hospital encounter of 04/11/24  Resp panel by RT-PCR (RSV, Flu A&B, Covid) Anterior Nasal Swab     Status: None   Collection Time: 04/11/24  3:27 PM   Specimen: Anterior Nasal Swab  Result Value Ref Range Status   SARS Coronavirus 2 by RT PCR NEGATIVE NEGATIVE Final    Comment: (NOTE) SARS-CoV-2 target nucleic acids are NOT DETECTED.  The SARS-CoV-2 RNA is generally detectable in upper  respiratory specimens during the acute phase of infection. The lowest concentration of SARS-CoV-2 viral copies this assay can detect is 138 copies/mL. A negative result does not preclude SARS-Cov-2 infection and should not be used as the sole basis for treatment or other patient management decisions. A negative result may occur with  improper specimen collection/handling, submission of specimen other than nasopharyngeal swab, presence of viral mutation(s) within the areas targeted by this assay, and inadequate number  of viral copies(<138 copies/mL). A negative result must be combined with clinical observations, patient history, and epidemiological information. The expected result is Negative.  Fact Sheet for Patients:  bloggercourse.com  Fact Sheet for Healthcare Providers:  seriousbroker.it  This test is no t yet approved or cleared by the [place]  FDA and  has been authorized for detection and/or diagnosis of SARS-CoV-2 by FDA under an Emergency Use Authorization (EUA). This EUA will remain  in effect (meaning this test can be used) for the duration of the COVID-19 declaration under Section 564(b)(1) of the Act, 21 U.S.C.section 360bbb-3(b)(1), unless the authorization is terminated  or revoked sooner.       Influenza A by PCR NEGATIVE NEGATIVE Final   Influenza B by PCR NEGATIVE NEGATIVE Final    Comment: (NOTE) The Xpert Xpress SARS-CoV-2/FLU/RSV plus assay is intended as an aid in the diagnosis of influenza from Nasopharyngeal swab specimens and should not be used as a sole basis for treatment. Nasal washings and aspirates are unacceptable for Xpert Xpress SARS-CoV-2/FLU/RSV testing.  Fact Sheet for Patients: bloggercourse.com  Fact Sheet for Healthcare Providers: seriousbroker.it  This test is not yet approved or cleared by the [place]  FDA and has been  authorized for detection and/or diagnosis of SARS-CoV-2 by FDA under an Emergency Use Authorization (EUA). This EUA will remain in effect (meaning this test can be used) for the duration of the COVID-19 declaration under Section 564(b)(1) of the Act, 21 U.S.C. section 360bbb-3(b)(1), unless the authorization is terminated or revoked.     Resp Syncytial Virus by PCR NEGATIVE NEGATIVE Final    Comment: (NOTE) Fact Sheet for Patients: bloggercourse.com  Fact Sheet for Healthcare Providers: seriousbroker.it  This test is not yet approved or cleared by the [place]  FDA and has been authorized for detection and/or diagnosis of SARS-CoV-2 by FDA under an Emergency Use Authorization (EUA). This EUA will remain in effect (meaning this test can be used) for the duration of the COVID-19 declaration under Section 564(b)(1) of the Act, 21 U.S.C. section 360bbb-3(b)(1), unless the authorization is terminated or revoked.  Performed at [facility], [address], [place], [place] [address]   MRSA Next Gen by PCR, Nasal     Status: None   Collection Time: 04/11/24  9:37 PM   Specimen: Nasal Mucosa; Nasal Swab  Result Value Ref Range Status   MRSA by PCR Next Gen NOT DETECTED NOT DETECTED Final    Comment: (NOTE) The GeneXpert MRSA Assay (FDA approved for NASAL specimens only), is one component of a comprehensive MRSA colonization surveillance program. It is not intended to diagnose MRSA infection nor to guide or monitor treatment for MRSA infections. Test performance is not FDA approved in patients less than 67 years old. Performed at [facility], [address], [place], [place] [address]     Labs: CBC: Recent Labs  Lab 04/11/24 1500 04/12/24 0310  WBC 13.4* 7.0  NEUTROABS 10.9* 3.7  HGB 11.9* 9.9*  HCT 37.7 31.8*  MCV 96.7 98.1  PLT 263 237   Basic Metabolic Panel: Recent Labs  Lab 04/11/24 1500 04/11/24 1721  04/12/24 0310  NA 131*  --  134*  K 7.4* 6.5* 5.9*  CL 94*  --  94*  CO2 22  --  25  GLUCOSE 449*  --  405*  BUN 48*  --  59*  CREATININE 5.28*  --  6.33*  CALCIUM  9.6  --  9.1  PHOS  --   --  5.0*  Liver Function Tests: Recent Labs  Lab 04/12/24 0310  ALBUMIN 3.3*   CBG: Recent Labs  Lab 04/11/24 1953 04/11/24 2141 04/12/24 0406 04/12/24 0626 04/12/24 0748  GLUCAP 356* 287* 483* 349* 228*    Discharge time spent: greater than 30 minutes.  Signed: Bernardino KATHEE Come, MD [facility] 04/12/2024

## 2024-04-12 NOTE — TOC Initial Note (Signed)
 Transition of Care [facility]) - Initial/Assessment Note    Patient Details  Name: Jasmine [name] MRN: [id number] Date of Birth: Dec 08, 1970  Transition of Care [facility]) CM/SW Contact:    Noreen KATHEE Pinal, LCSWA Phone Number: 04/12/2024, 11:14 AM  Clinical Narrative:                 Patient is risk for readmission due to high admission score, patient was admitted for Hyperglycemia. CSW spoke with patient at bedside and assessed her. Patient reports that she is independent at home, has all necessary equipment including oxygen, and support from her husband. Patient reports  that she is still able to drive. MD is looking at DC today , patient stated that she will have someone pick her up because he husband is working. CSW signing off.   Expected Discharge Plan: Home/Self Care Barriers to Discharge: Continued Medical Work up   Patient Goals and CMS Choice Patient states their goals for this hospitalization and ongoing recovery are:: return back home CMS Medicare.gov Compare Post Acute Care list provided to:: Patient Choice offered to / list presented to : Patient      Expected Discharge Plan and Services     Post Acute Care Choice: Durable Medical Equipment Living arrangements for the past 2 months: Single Family Home                                      Prior Living Arrangements/Services Living arrangements for the past 2 months: Single Family Home Lives with:: Spouse Patient language and need for interpreter reviewed:: Yes Do you feel safe going back to the place where you live?: Yes      Need for Family Participation in Patient Care: Yes (Comment) Care giver support system in place?: No (comment) Current home services: DME Criminal Activity/Legal Involvement Pertinent to Current Situation/Hospitalization: No - Comment as needed  Activities of Daily Living   ADL Screening (condition at time of admission) Independently performs ADLs?: Yes (appropriate for developmental  age) Is the patient deaf or have difficulty hearing?: No Does the patient have difficulty seeing, even when wearing glasses/contacts?: No Does the patient have difficulty concentrating, remembering, or making decisions?: No  Permission Sought/Granted      Share Information with NAME: Mashell     Permission granted to share info w Relationship: Patient     Emotional Assessment Appearance:: Appears stated age Attitude/Demeanor/Rapport: Engaged Affect (typically observed): Stable, Accepting Orientation: : Oriented to Self, Oriented to Place, Oriented to  Time, Oriented to Situation Alcohol / Substance Use: Not Applicable Psych Involvement: No (comment)  Admission diagnosis:  Hyperglycemia [R73.9] Patient Active Problem List   Diagnosis Date Noted   Non-ST elevation (NSTEMI) myocardial infarction (HCC) 11/03/2023   Chest pain 10/30/2023   Bacteremia 09/08/2023   MRSA bacteremia 09/06/2023   Sepsis due to methicillin resistant Staphylococcus aureus (MRSA) (HCC) 09/05/2023   Myocardial injury 09/05/2023   Abscess of right thigh 09/05/2023   Hypertensive emergency 03/29/2023   Pseudohyponatremia 03/29/2023   Hypoalbuminemia due to protein-calorie malnutrition 03/29/2023   End-stage renal disease on hemodialysis (HCC) 03/29/2023   Chronic HFrEF (heart failure with reduced ejection fraction) (HCC) 03/29/2023   DKA, type 1 (HCC) 03/29/2023   Asthma 03/07/2023   CAD (coronary artery disease) 03/07/2023   Orthostatic hypotension 03/15/2022   Cerebral vascular disease 03/15/2022   Nonintractable headache    Elevated troponin 09/11/2021   Hypercalcemia 09/10/2021  GERD (gastroesophageal reflux disease) 09/09/2021   DKA (diabetic ketoacidosis) (HCC) 09/09/2021   Other hyperlipidemia 09/09/2021   Chronic migraine w/o aura w/o status migrainosus, not intractable 03/23/2021   Diastolic heart failure (HCC) 03/24/2020   Chronic respiratory failure with hypoxia (HCC) 10/05/2019    Depression 10/01/2019   Memory loss 10/01/2019   Marital estrangement 06/19/2019   Peripheral neuropathy 02/15/2019   Dizziness 09/27/2018   Polypharmacy 09/27/2018   Severe recurrent major depression without psychotic features (HCC) 08/30/2018   Bipolar 2 disorder, major depressive episode (HCC) 07/10/2018   MDD (major depressive disorder), recurrent, severe, 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/10/2018   CKD (chronic kidney disease), stage IV (HCC) 07/01/2018   Transient Hypoglycemia 07/01/2018   Diabetic peripheral neuropathy (HCC) 01/17/2017   OSA on CPAP 07/27/2016   Wheezing 07/27/2016   Hyperglycemia 12/25/2012   Acute renal failure superimposed on stage 4 chronic kidney disease (HCC) 12/25/2012   Postnasal drip 10/02/2012   Hyperkalemia 09/25/2012   Obesity, Class III, BMI 40-49.9 (morbid obesity) (HCC) 09/24/2012   Essential hypertension 09/24/2012   Bipolar II disorder (HCC) 09/24/2012   Uncontrolled diabetes mellitus with hyperglycemia, with long-term current use of insulin  (HCC) 11/04/2011   Gastroenteritis 11/03/2011   Hyponatremia 11/03/2011   Elevated lipase 11/03/2011   Acquired hypothyroidism 11/03/2011   Tobacco abuse 11/03/2011   SUI (stress urinary incontinence, female) 08/16/2011   PCP:  Debrah Josette ORN., PA-C Pharmacy:   [facility] [place], [place] - [address] [place] [address] Phone: [phone] Fax: [phone]     Social Drivers of Health (SDOH) Social History: SDOH Screenings   Food Insecurity: No Food Insecurity (04/11/2024)  Housing: Low Risk  (04/11/2024)  Transportation Needs: No Transportation Needs (04/11/2024)  Utilities: Not At Risk (04/11/2024)  Alcohol Screen: Low Risk  (08/30/2018)  Depression (PHQ2-9): High Risk (12/14/2021)  Financial Resource Strain: Low Risk  (12/22/2022)   Received from [facility]  Physical Activity: Inactive (01/23/2019)  Social Connections: Socially Integrated (04/11/2024)   Stress: Stress Concern Present (01/23/2019)  Tobacco Use: Medium Risk (04/01/2024)   SDOH Interventions:     Readmission Risk Interventions    04/12/2024   11:09 AM 10/31/2023    9:11 AM 09/06/2023    8:18 AM  Readmission Risk Prevention Plan  Transportation Screening Complete Complete Complete  HRI or Home Care Consult   Complete  Social Work Consult for Recovery Care Planning/Counseling   Complete  Palliative Care Screening   Not Applicable  Medication Review Oceanographer) Complete Complete Complete  HRI or Home Care Consult Complete Complete   SW Recovery Care/Counseling Consult Complete Complete   Palliative Care Screening Not Applicable Not Applicable   Skilled Nursing Facility Not Applicable Not Applicable

## 2024-04-12 NOTE — Progress Notes (Signed)
 Pt receives out-pt HD on TTS 0630 chair time, at [facility]. Contacted clinic to inform pt is in hospital. Will continue to assist as needed.   Lavanda Briony Parveen Dialysis Navigator [phone]

## 2024-04-12 NOTE — Progress Notes (Signed)
2D echo attempted, patient in dialysis. Will try later 

## 2024-04-12 NOTE — Inpatient Diabetes Management (Addendum)
 Inpatient Diabetes Program Recommendations  AACE/ADA: New Consensus Statement on Inpatient Glycemic Control   Target Ranges:  Prepandial:   less than 140 mg/dL      Peak postprandial:   less than 180 mg/dL (1-2 hours)      Critically ill patients:  140 - 180 mg/dL    Latest Reference Range & Units 04/11/24 14:49 04/11/24 17:14 04/11/24 18:20 04/11/24 19:53 04/11/24 21:41 04/12/24 04:06 04/12/24 06:26 04/12/24 07:48  Glucose-Capillary 70 - 99 mg/dL 562 (H)   Novolog  6 units 358 (H) 356 (H)  Novolog  8 units 287 (H)  Novolog  3 units 483 (H)  Novolog  20 units 349 (H) 228 (H)  Novolog  15 units    Latest Reference Range & Units 04/11/24 15:00 04/12/24 03:10  CO2 22 - 32 mmol/L 22 25  Glucose 70 - 99 mg/dL 550 (H) 594 (H)  Anion gap 5 - 15  15 15    Review of Glycemic Control  Diabetes history: DM1 Outpatient Diabetes medications: Tandem MOBI insulin  pump with Novolog , Dexcom G7, Tresiba  50 units daily if not using pump Current orders for Inpatient glycemic control: Semglee  10 units BID, Novolog  0-20 units TID with meals, Novolog  0-5 units at bedtime, Novolog  8 units TID with meals  Inpatient Diabetes Program Recommendations:    Insulin : Patient received Novolog  20 units at 4:34 am and Novolog  15 units at 8:15 am today. Concerned about hypoglycemia with patient receiving a total of Novolog  35 units given this morning.  Please consider increasing Semglee  to 20 units BID (to start at bedtime) and decrease Novolog  to 0-9 units TID with meals.  NOTE: Patient with hx of Type 1 DM that uses an insulin  pump outpatient for DM control admitted with hyperglycemia, hyperkalemia, and chest pain. Patient was last inpatient 10/30/23-11/03/23 and was seen my inpatient diabetes coordinator during admission. At time of discharge on 11/03/23, patient was to resume insulin  pump but given prescription for Tresiba  and Novolog  pens to have on hand in case of pump failure.  Patient sees Dr. Elsie Sharps  (Endocrinologist) and was seen on 01/02/24 and per office note Adjusted pump settings to address low sugars - lowered basal at 12 am and ISF.  Will plan to talk with patient today.  Addendum 04/12/24@12 :15-Spoke with patient regarding DM control. Patient states that she uses MOBI insulin  pump with Novolog  insulin  and Dexcom G7 CGM. Patient reports that she removed her insulin  pump in the ED on the day she came to the hospital but she still has on her Dexcom G7 CGM. Patient reports that the day she came to the hospital, her glucose was initially low and she ate and then glucose was up over 400 and she could not get it to come down. Patient states that she has to fill up insulin  pump every 2 days and she fills with about 200 units of insulin  (which she uses all of within 2 days). Patient confirms that she last seen Dr. Sharps in July and pump settings were adjusted due to hypoglycemia. Patient states she still has hypoglycemia about 2 times per week. Patient reports that her insulin  pump has a wireless charger and she does not have it here at the hospital so her insulin  pump is dead. Patient states she can see her pump settings on her phone app for the pump but currently unable to do so since the insulin  pump battery is dead. Patient does not know what her insulin  pump settings are.  Discussed current insulin  orders and explained that it  would be requested that the Semglee  dose be increased and closer to insulin  dosages ordered when she was inpatient in May 2025. Patient states her current CGM glucose is 115 mg/dl and she is currently getting hemodialysis. Patient states she has plenty of DM medications and supplies at home and she plans to resume her insulin  pump after she is discharged home and gets her pump battery charged.  Encouraged patient to follow up with Dr. Claudene regarding DM management and be sure he is aware she is still having some issues with hypoglycemia as the pump settings may need to be adjusted.   Patient verbalized understanding of information and has no questions at this time.  Thanks, Earnie Gainer, RN, MSN, CDCES Diabetes Coordinator Inpatient Diabetes Program [phone] (Team Pager from 8am to 5pm)

## 2024-04-12 NOTE — Consult Note (Signed)
 Reason for Consult: To manage dialysis and dialysis related needs Referring Physician: Dr Bryn Delon LOISE Jasmine [name] is an 53 y.o. female.   HPI: Pt is a 64F with a PMH sig for DM I, ESRD on TTS, HTN, HLD, CAD, HFpEF, who presented to ED yesterday for malfunctioning insulin  pump and symptoms suggestive of impending DKA.    Had hyperglycemia but no evidence of DKA- put on long- acting insulin , hyperkalemia treated.  Was 7.4--> 5.9 this AM after rx.    In this setting we are asked to see.  Says she's feeling a lot better today.  For HD today.  Using AVF, newly being cannulated.  No TDC.  Looks like she is being cannulated with 16 g needles.   Dialyzes at HD TTS [facility] Using AVF  Past Medical History:  Diagnosis Date   Anemia    Anxiety    Arthritis    Asthma    Balance problems    Bipolar disorder (HCC)    Charcot ankle    CHF (congestive heart failure) (HCC)    Chronic fatigue    Coronary artery disease    Depression    DKA, type 1 (HCC) 11/04/2011   Elevated cholesterol    ESRD on hemodialysis (HCC)    TTS at [facility]   Fibromyalgia    GERD (gastroesophageal reflux disease)    Headache    History of suicidal ideation    Hyperlipemia    Hypertension    Hypothyroidism    IBS (irritable bowel syndrome)    Memory changes    Obesity    Pneumonia    x several   Sleep apnea    uses oxygen 3L at night with cpap nightly   Stress incontinence    Pt had surgery to correct this.   Tobacco abuse    Tremor     Past Surgical History:  Procedure Laterality Date   A/V FISTULAGRAM Left 03/05/2024   Procedure: A/V Fistulagram;  Surgeon: Magda Debby LOISE, MD;  Location: [facility];  Service: Cardiovascular;  Laterality: Left;   A/V SHUNT INTERVENTION N/A 03/05/2024   Procedure: A/V SHUNT INTERVENTION;  Surgeon: Magda Debby LOISE, MD;  Location: [facility];  Service: Cardiovascular;  Laterality: N/A;   AV FISTULA PLACEMENT Left 10/14/2023   Procedure: BRACHIOBASILIC  ARTERIOVENOUS (AV) FISTULA CREATION LEFT;  Surgeon: Serene Gaile ORN, MD;  Location: [facility];  Service: Vascular;  Laterality: Left;   BASCILIC VEIN TRANSPOSITION Left 12/16/2023   Procedure: TRANSPOSITION, VEIN, BASILIC;  Surgeon: Serene Gaile ORN, MD;  Location: [facility];  Service: Vascular;  Laterality: Left;   CORONARY IMAGING/OCT N/A 08/30/2023   Procedure: CORONARY IMAGING/OCT;  Surgeon: Wendel Lurena POUR, MD;  Location[facility];  Service: Cardiovascular;  Laterality: N/A;   CORONARY STENT INTERVENTION N/A 08/30/2023   Procedure: CORONARY STENT INTERVENTION;  Surgeon: Wendel Lurena POUR, MD;  [facility];  Service: Cardiovascular;  Laterality: N/A;   DIALYSIS/PERMA CATHETER INSERTION N/A 11/30/2023   Procedure: DIALYSIS/PERMA CATHETER INSERTION;  Surgeon: Pearline Norman RAMAN, MD;  Location: [facility];  Service: Cardiovascular;  Laterality: N/A;   INCONTINENCE SURGERY     IR FLUORO GUIDE CV LINE LEFT  09/10/2023   IR PATIENT EVAL TECH 0-60 MINS  04/04/2024   IR US  GUIDE VASC ACCESS LEFT  09/10/2023   LEFT HEART CATH AND CORONARY ANGIOGRAPHY N/A 07/15/2023   Procedure: LEFT HEART CATH AND CORONARY ANGIOGRAPHY;  Surgeon: Thukkani, Arun K,  MD;  [facility];  Service: Cardiovascular;  Laterality: N/A;   LEFT HEART CATH AND CORONARY ANGIOGRAPHY N/A 08/30/2023   Procedure: LEFT HEART CATH AND CORONARY ANGIOGRAPHY;  Surgeon: Wendel Lurena POUR, MD;  [facility];  Service: Cardiovascular;  Laterality: N/A;   NASAL FRACTURE SURGERY     ovary removed     OVARY SURGERY     PUBOVAGINAL SLING  08/16/2011   Procedure: CARLOYN GLADE;  Surgeon: Alm GORMAN Fragmin, MD;  Location: [facility];  Service: Urology;  Laterality: N/A;          TEE WITHOUT CARDIOVERSION N/A 09/08/2023   Procedure: ECHOCARDIOGRAM, TRANSESOPHAGEAL;  Surgeon: Alvan Dorn FALCON, MD;  Location: [facility];  Service: Endoscopy;  Laterality: N/A;   TUNNELLED CATHETER EXCHANGE N/A  09/30/2023   Procedure: TUNNELLED CATHETER EXCHANGE;  Surgeon: Melia Lynwood ORN, MD;  Location: [facility] INVASIVE CV LAB;  Service: Cardiovascular;  Laterality: N/A;   TUNNELLED CATHETER EXCHANGE N/A 10/07/2023   Procedure: TUNNELLED CATHETER EXCHANGE;  Surgeon: Tobie Gordy POUR, MD;  Location: [facility] INVASIVE CV LAB;  Service: Cardiovascular;  Laterality: N/A;   UTERINE FIBROID SURGERY  2001   VENOUS ANGIOPLASTY Left 03/05/2024   Procedure: VENOUS ANGIOPLASTY;  Surgeon: Magda Debby SAILOR, MD;  Location: [facility];  Service: Cardiovascular;  Laterality: Left;  Basilic Vein    Family History  Problem Relation Age of Onset   Asthma Mother    Bipolar disorder Mother    Heart disease Father    Lymphoma Father    Hypertension Father    Thyroid  disease Father    Hyperlipidemia Father    Diabetes Father    Cancer Paternal Grandmother        lung and breast   Bladder Cancer Paternal Grandfather    Suicidality Maternal Grandfather    Thyroid  disease Brother     Social History:  reports that she quit smoking about 12 years ago. Her smoking use included cigarettes. She started smoking about 32 years ago. She has a 15 pack-year smoking history. She has been exposed to tobacco smoke. She has never used smokeless tobacco. She reports that she does not drink alcohol and does not use drugs.  Allergies:  Allergies  Allergen Reactions   Ciprofloxacin  Nausea Only, Swelling, Dermatitis and Other (See Comments)    Per pt caused lips swell and nauseous feeling   Levofloxacin Other (See Comments), Swelling and Nausea Only    Per pt caused lips swell and nauseous feeling   Linaclotide Nausea Only and Other (See Comments)    Cause severe dehydration   Promethazine  Other (See Comments)    Completely wipes out/fatigue   Buspar [Buspirone] Other (See Comments)    Abd cramping   Advair Diskus [Fluticasone -Salmeterol] Other (See Comments)    Thrush    Clarithromycin Dermatitis and Rash   Hydroxyzine Palpitations    Ondansetron  Hcl Other (See Comments)    Makes pt more nauseous and drunk    Medications: Scheduled:  aspirin  EC  81 mg Oral QHS   Chlorhexidine  Gluconate Cloth  6 each Topical Q0600   clopidogrel   75 mg Oral Daily   DULoxetine   30 mg Oral Daily   folic acid   1 mg Oral Daily   heparin   5,000 Units Subcutaneous Q8H   insulin  aspart  0-20 Units Subcutaneous TID WC   insulin  aspart  0-5 Units Subcutaneous QHS   insulin  aspart  8 Units Subcutaneous TID WC   insulin  glargine-yfgn  10  Units Subcutaneous BID   lamoTRIgine   200 mg Oral QPM   levothyroxine   175 mcg Oral QAC breakfast   rosuvastatin   10 mg Oral QHS   sevelamer  carbonate  1,600 mg Oral TID WC   sodium zirconium cyclosilicate  10 g Oral TID     Results for orders placed or performed during the hospital encounter of 04/11/24 (from the past 48 hours)  CBG monitoring, ED     Status: Abnormal   Collection Time: 04/11/24  2:49 PM  Result Value Ref Range   Glucose-Capillary 437 (H) 70 - 99 mg/dL    Comment: Glucose reference range applies only to samples taken after fasting for at least 8 hours.   Comment 1 Notify RN    Comment 2 Call MD NNP PA CNM   Basic metabolic panel     Status: Abnormal   Collection Time: 04/11/24  3:00 PM  Result Value Ref Range   Sodium 131 (L) 135 - 145 mmol/L   Potassium 7.4 (HH) 3.5 - 5.1 mmol/L    Comment: HEMOLYSIS AT THIS LEVEL MAY AFFECT RESULT Hemolysis at this level may affect result  Critical Value, Read Back and verified with FLETCHER ,A ON 04/11/24 AT 1645 BY LOY, C    Chloride 94 (L) 98 - 111 mmol/L   CO2 22 22 - 32 mmol/L   Glucose, Bld 449 (H) 70 - 99 mg/dL    Comment: Glucose reference range applies only to samples taken after fasting for at least 8 hours.   BUN 48 (H) 6 - 20 mg/dL   Creatinine, Ser 4.71 (H) 0.44 - 1.00 mg/dL   Calcium  9.6 8.9 - 10.3 mg/dL   GFR, Estimated 9 (L) >60 mL/min    Comment: (NOTE) Calculated using the CKD-EPI Creatinine Equation (2021)    Anion gap  15 5 - 15    Comment: Performed at [facility], [address] [place], [place] [address]  Beta-hydroxybutyric acid     Status: Abnormal   Collection Time: 04/11/24  3:00 PM  Result Value Ref Range   Beta-Hydroxybutyric Acid 1.26 (H) 0.05 - 0.27 mmol/L    Comment: Performed at [facility], [address], [place], [place] [address]  CBC with Differential (PNL)     Status: Abnormal   Collection Time: 04/11/24  3:00 PM  Result Value Ref Range   WBC 13.4 (H) 4.0 - 10.5 K/uL   RBC 3.90 3.87 - 5.11 MIL/uL   Hemoglobin 11.9 (L) 12.0 - 15.0 g/dL   HCT 62.2 63.9 - 53.9 %   MCV 96.7 80.0 - 100.0 fL   MCH 30.5 26.0 - 34.0 pg   MCHC 31.6 30.0 - 36.0 g/dL   RDW 85.8 88.4 - 84.4 %   Platelets 263 150 - 400 K/uL    Comment: REPEATED TO VERIFY PLATELET COUNT CONFIRMED BY SMEAR    nRBC 0.0 0.0 - 0.2 %   Neutrophils Relative % 80 %   Neutro Abs 10.9 (H) 1.7 - 7.7 K/uL   Lymphocytes Relative 11 %   Lymphs Abs 1.5 0.7 - 4.0 K/uL   Monocytes Relative 6 %   Monocytes Absolute 0.8 0.1 - 1.0 K/uL   Eosinophils Relative 1 %   Eosinophils Absolute 0.1 0.0 - 0.5 K/uL   Basophils Relative 1 %   Basophils Absolute 0.1 0.0 - 0.1 K/uL   WBC Morphology MORPHOLOGY UNREMARKABLE    RBC Morphology MORPHOLOGY UNREMARKABLE    Immature Granulocytes 1 %   Abs  Immature Granulocytes 0.07 0.00 - 0.07 K/uL    Comment: Performed at [facility], [address], [place], [place] [address]  Pro Brain natriuretic peptide     Status: Abnormal   Collection Time: 04/11/24  3:00 PM  Result Value Ref Range   Pro Brain Natriuretic Peptide 6,179.0 (H) <300.0 pg/mL    Comment: (NOTE) Age Group        Cut-Points    Interpretation  < 50 years     450 pg/mL       NT-proBNP > 450 pg/mL indicates                                ADHF is likely              50 to 75 years  900 pg/mL      NT-proBNP > 900 pg/mL indicates          ADHF is likely  > 75 years      1800 pg/mL     NT-proBNP > 1800 pg/mL indicates          ADHF  is likely                           All ages    Results between       Indeterminate. Further clinical             300 and the cut-   information is needed to determine            point for age group   if ADHF is present.                                                             Elecsys proBNP II/ Elecsys proBNP II STAT           Cut-Point                       Interpretation  300 pg/mL                    NT-proBNP <300pg/mL indicates                             ADHF is not likely  Performed at [facility], [address], [place], [place] [address]   Resp panel by RT-PCR (RSV, Flu A&B, Covid) Anterior Nasal Swab     Status: None   Collection Time: 04/11/24  3:27 PM   Specimen: Anterior Nasal Swab  Result Value Ref Range   SARS Coronavirus 2 by RT PCR NEGATIVE NEGATIVE    Comment: (NOTE) SARS-CoV-2 target nucleic acids are NOT DETECTED.  The SARS-CoV-2 RNA is generally detectable in upper respiratory specimens during the acute phase of infection. The lowest concentration of SARS-CoV-2 viral copies this assay can detect is 138 copies/mL. A negative result does not preclude SARS-Cov-2 infection and should not be used as the sole basis for treatment or other patient management decisions. A negative result may occur with  improper specimen collection/handling, submission of specimen other than nasopharyngeal swab, presence of viral mutation(s) within the areas  targeted by this assay, and inadequate number of viral copies(<138 copies/mL). A negative result must be combined with clinical observations, patient history, and epidemiological information. The expected result is Negative.  Fact Sheet for Patients:  bloggercourse.com  Fact Sheet for Healthcare Providers:  seriousbroker.it  This test is no t yet approved or cleared by the [place]  FDA and  has been authorized for detection and/or diagnosis of SARS-CoV-2 by FDA  under an Emergency Use Authorization (EUA). This EUA will remain  in effect (meaning this test can be used) for the duration of the COVID-19 declaration under Section 564(b)(1) of the Act, 21 U.S.C.section 360bbb-3(b)(1), unless the authorization is terminated  or revoked sooner.       Influenza A by PCR NEGATIVE NEGATIVE   Influenza B by PCR NEGATIVE NEGATIVE    Comment: (NOTE) The Xpert Xpress SARS-CoV-2/FLU/RSV plus assay is intended as an aid in the diagnosis of influenza from Nasopharyngeal swab specimens and should not be used as a sole basis for treatment. Nasal washings and aspirates are unacceptable for Xpert Xpress SARS-CoV-2/FLU/RSV testing.  Fact Sheet for Patients: bloggercourse.com  Fact Sheet for Healthcare Providers: seriousbroker.it  This test is not yet approved or cleared by the [place]  FDA and has been authorized for detection and/or diagnosis of SARS-CoV-2 by FDA under an Emergency Use Authorization (EUA). This EUA will remain in effect (meaning this test can be used) for the duration of the COVID-19 declaration under Section 564(b)(1) of the Act, 21 U.S.C. section 360bbb-3(b)(1), unless the authorization is terminated or revoked.     Resp Syncytial Virus by PCR NEGATIVE NEGATIVE    Comment: (NOTE) Fact Sheet for Patients: bloggercourse.com  Fact Sheet for Healthcare Providers: seriousbroker.it  This test is not yet approved or cleared by the [place]  FDA and has been authorized for detection and/or diagnosis of SARS-CoV-2 by FDA under an Emergency Use Authorization (EUA). This EUA will remain in effect (meaning this test can be used) for the duration of the COVID-19 declaration under Section 564(b)(1) of the Act, 21 U.S.C. section 360bbb-3(b)(1), unless the authorization is terminated or revoked.  Performed at [facility], [address], [place], [place] [address]   Blood gas, venous     Status: Abnormal   Collection Time: 04/11/24  4:41 PM  Result Value Ref Range   pH, Ven 7.37 7.25 - 7.43   pCO2, Ven 48 44 - 60 mmHg   pO2, Ven <31 (LL) 32 - 45 mmHg    Comment: CRITICAL RESULT CALLED TO, READ BACK BY AND VERIFIED WITH: FLETCHER, A ON 03/11/24 AT 1718 BY LOY, C     Bicarbonate 27.7 20.0 - 28.0 mmol/L   Acid-Base Excess 1.6 0.0 - 2.0 mmol/L   O2 Saturation 46.1 %   Patient temperature 36.6    Collection site BLOOD RIGHT FOREARM    Drawn by [id number]     Comment: Performed at [facility], [address], [place], [place] [address]  Potassium     Status: Abnormal   Collection Time: 04/11/24  5:21 PM  Result Value Ref Range   Potassium 6.5 (HH) 3.5 - 5.1 mmol/L    Comment: Critical Value, Read Back and verified with MARLA BOHR RN [phone] MARLA CAROLIN Performed at [facility], [address], [place], [place] [address]   Urinalysis, Routine w reflex microscopic -Urine, Clean Catch     Status: Abnormal   Collection Time: 04/11/24  5:22 PM  Result Value Ref Range   Color,  Urine YELLOW YELLOW   APPearance CLEAR CLEAR   Specific Gravity, Urine 1.008 1.005 - 1.030   pH 8.0 5.0 - 8.0   Glucose, UA >=500 (A) NEGATIVE mg/dL   Hgb urine dipstick NEGATIVE NEGATIVE   Bilirubin Urine NEGATIVE NEGATIVE   Ketones, ur NEGATIVE NEGATIVE mg/dL   Protein, ur >=699 (A) NEGATIVE mg/dL   Nitrite NEGATIVE NEGATIVE   Leukocytes,Ua TRACE (A) NEGATIVE   RBC / HPF 0-5 0 - 5 RBC/hpf   WBC, UA 11-20 0 - 5 WBC/hpf   Bacteria, UA RARE (A) NONE SEEN   Squamous Epithelial / HPF 0-5 0 - 5 /HPF    Comment: Performed at [facility], [address], [place], [place] [address]  CBG monitoring, ED     Status: Abnormal   Collection Time: 04/11/24  6:20 PM  Result Value Ref Range   Glucose-Capillary 358 (H) 70 - 99 mg/dL    Comment: Glucose reference range applies only to samples taken after fasting for at least 8 hours.  Hepatitis B surface antigen      Status: None   Collection Time: 04/11/24  7:29 PM  Result Value Ref Range   Hepatitis B Surface Ag NON REACTIVE NON REACTIVE    Comment: Performed at [facility], [address], [place], [place] [address]  Hemoglobin A1c     Status: None   Collection Time: 04/11/24  7:29 PM  Result Value Ref Range   Hgb A1c MFr Bld 5.5 4.8 - 5.6 %    Comment: (NOTE) Diagnosis of Diabetes The following HbA1c ranges recommended by the American Diabetes Association (ADA) may be used as an aid in the diagnosis of diabetes mellitus.  Hemoglobin             Suggested A1C NGSP%              Diagnosis  <5.7                   Non Diabetic  5.7-6.4                Pre-Diabetic  >6.4                   Diabetic  <7.0                   Glycemic control for                       adults with diabetes.     Mean Plasma Glucose 111.15 mg/dL    Comment: Performed at [facility], [address], [place], [place] [address]  HIV Antibody (routine testing w rflx)     Status: None   Collection Time: 04/11/24  7:29 PM  Result Value Ref Range   HIV Screen 4th Generation wRfx Non Reactive Non Reactive    Comment: Performed at [facility], [address] [place], [place] [address]  CBG monitoring, ED     Status: Abnormal   Collection Time: 04/11/24  7:53 PM  Result Value Ref Range   Glucose-Capillary 356 (H) 70 - 99 mg/dL    Comment: Glucose reference range applies only to samples taken after fasting for at least 8 hours.   Comment 1 Document in Chart    Comment 2 Call MD NNP PA CNM   MRSA Next Gen by PCR, Nasal     Status: None   Collection Time: 04/11/24  9:37  PM   Specimen: Nasal Mucosa; Nasal Swab  Result Value Ref Range   MRSA by PCR Next Gen NOT DETECTED NOT DETECTED    Comment: (NOTE) The GeneXpert MRSA Assay (FDA approved for NASAL specimens only), is one component of a comprehensive MRSA colonization surveillance program. It is not intended to diagnose MRSA infection nor to  guide or monitor treatment for MRSA infections. Test performance is not FDA approved in patients less than 76 years old. Performed at [facility], [address], [place], [place] [address]   Glucose, capillary     Status: Abnormal   Collection Time: 04/11/24  9:41 PM  Result Value Ref Range   Glucose-Capillary 287 (H) 70 - 99 mg/dL    Comment: Glucose reference range applies only to samples taken after fasting for at least 8 hours.  CBC with Differential/Platelet     Status: Abnormal   Collection Time: 04/12/24  3:10 AM  Result Value Ref Range   WBC 7.0 4.0 - 10.5 K/uL   RBC 3.24 (L) 3.87 - 5.11 MIL/uL   Hemoglobin 9.9 (L) 12.0 - 15.0 g/dL   HCT 68.1 (L) 63.9 - 53.9 %   MCV 98.1 80.0 - 100.0 fL   MCH 30.6 26.0 - 34.0 pg   MCHC 31.1 30.0 - 36.0 g/dL   RDW 85.6 88.4 - 84.4 %   Platelets 237 150 - 400 K/uL   nRBC 0.0 0.0 - 0.2 %   Neutrophils Relative % 53 %   Neutro Abs 3.7 1.7 - 7.7 K/uL   Lymphocytes Relative 33 %   Lymphs Abs 2.3 0.7 - 4.0 K/uL   Monocytes Relative 13 %   Monocytes Absolute 0.9 0.1 - 1.0 K/uL   Eosinophils Relative 0 %   Eosinophils Absolute 0.0 0.0 - 0.5 K/uL   Basophils Relative 1 %   Basophils Absolute 0.1 0.0 - 0.1 K/uL   Immature Granulocytes 0 %   Abs Immature Granulocytes 0.02 0.00 - 0.07 K/uL    Comment: Performed at [facility], [address] [place], [place] [address]  Renal function panel     Status: Abnormal   Collection Time: 04/12/24  3:10 AM  Result Value Ref Range   Sodium 134 (L) 135 - 145 mmol/L   Potassium 5.9 (H) 3.5 - 5.1 mmol/L   Chloride 94 (L) 98 - 111 mmol/L   CO2 25 22 - 32 mmol/L   Glucose, Bld 405 (H) 70 - 99 mg/dL    Comment: Glucose reference range applies only to samples taken after fasting for at least 8 hours.   BUN 59 (H) 6 - 20 mg/dL   Creatinine, Ser 3.66 (H) 0.44 - 1.00 mg/dL   Calcium  9.1 8.9 - 10.3 mg/dL   Phosphorus 5.0 (H) 2.5 - 4.6 mg/dL   Albumin 3.3 (L) 3.5 - 5.0 g/dL   GFR, Estimated 7 (L) >60 mL/min     Comment: (NOTE) Calculated using the CKD-EPI Creatinine Equation (2021)    Anion gap 15 5 - 15    Comment: Performed at [facility], [address], [place], [place] [address]  Troponin T, High Sensitivity     Status: Abnormal   Collection Time: 04/12/24  3:10 AM  Result Value Ref Range   Troponin T High Sensitivity 92 (H) 0 - 19 ng/L    Comment: (NOTE) Biotin concentrations > 1000 ng/mL falsely decrease TnT results.  Serial cardiac troponin measurements are suggested.  Refer to the Links section for chest pain algorithms and  additional  guidance. Performed at [facility], [address], [place], [place] [address]   Glucose, capillary     Status: Abnormal   Collection Time: 04/12/24  4:06 AM  Result Value Ref Range   Glucose-Capillary 483 (H) 70 - 99 mg/dL    Comment: Glucose reference range applies only to samples taken after fasting for at least 8 hours.  Troponin T, High Sensitivity     Status: Abnormal   Collection Time: 04/12/24  5:50 AM  Result Value Ref Range   Troponin T High Sensitivity 98 (H) 0 - 19 ng/L    Comment: (NOTE) Biotin concentrations > 1000 ng/mL falsely decrease TnT results.  Serial cardiac troponin measurements are suggested.  Refer to the Links section for chest pain algorithms and additional  guidance. Performed at [facility], [address], [place], [place] [address]   Glucose, capillary     Status: Abnormal   Collection Time: 04/12/24  6:26 AM  Result Value Ref Range   Glucose-Capillary 349 (H) 70 - 99 mg/dL    Comment: Glucose reference range applies only to samples taken after fasting for at least 8 hours.  Glucose, capillary     Status: Abnormal   Collection Time: 04/12/24  7:48 AM  Result Value Ref Range   Glucose-Capillary 228 (H) 70 - 99 mg/dL    Comment: Glucose reference range applies only to samples taken after fasting for at least 8 hours.    DG Chest Portable 1 View Result Date: 04/11/2024 CLINICAL DATA:  Shortness of breath,  hypoglycemia EXAM: PORTABLE CHEST 1 VIEW COMPARISON:  04/01/2024 FINDINGS: The heart size and mediastinal contours are within normal limits. Both lungs are clear. The visualized skeletal structures are unremarkable. IMPRESSION: No active disease. Electronically Signed   By: Ozell Daring M.D.   On: 04/11/2024 15:30    ROS: all other systems reviewed and are negative except as per HPI  Blood pressure (!) 112/44, pulse 77, temperature 98.3 F (36.8 C), temperature source Oral, resp. rate 18, height 5' 9 (1.753 m), weight 122.7 kg, last menstrual period 03/23/2017, SpO2 96%. gen nad, lying flat in bed Heent EOMI PERRL Neck no JVD Pulm clear Cv RRR Abd soft Ext trace LE edema Access L AVF + T/B  Assessment/Plan: 1 hyperglycemia: d/t malfunctioning insulin  pump.  Able to avoid insulin  gtt, given SubQ insulin .  Sugars still a little high but improved, DM educator to see.  Per Primary 2 ESRD: TTS, for HD today.  D/w primary- HD planned for this AM, may be able to d/c thereafter 3 Hypertension: some hypotension earlier- now improved 4. Anemia of ESRD: Hgb 9.9, follow 5. 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  No binder listed on home meds in Epic 6.  Hyperkalemia: expect improvement after HD today, temporized overnight 7.  Dispo: pending  Jasmine [name] 04/12/2024, 9:57 AM

## 2024-04-13 LAB — HEPATITIS B SURFACE ANTIBODY, QUANTITATIVE: Hep B S AB Quant (Post): <3.5 m[IU]/mL — ABNORMAL LOW

## 2024-04-13 NOTE — Progress Notes (Signed)
 Late note entry 11/7 1023am D/c noted. Contacted [facility] to inform of pt d/c and anticipated arrival back tomorrow. [facility] has already obtained d/c summary and last note, no further support needed at this time.   Solene Hereford Dialysis Navigator [phone]

## 2024-04-18 ENCOUNTER — Other Ambulatory Visit: Payer: Self-pay

## 2024-04-18 MED ORDER — METOPROLOL SUCCINATE ER 25 MG PO TB24: 25.0000 mg | ORAL_TABLET | Freq: Every day | ORAL | 3 refills | Status: DC

## 2024-04-26 ENCOUNTER — Other Ambulatory Visit: Payer: Self-pay | Admitting: Psychiatry

## 2024-04-26 DIAGNOSIS — F3181 Bipolar II disorder: Secondary | ICD-10-CM

## 2024-05-01 ENCOUNTER — Other Ambulatory Visit: Payer: Self-pay | Admitting: Psychiatry

## 2024-05-04 ENCOUNTER — Encounter: Payer: Self-pay | Admitting: Internal Medicine

## 2024-05-04 ENCOUNTER — Encounter (HOSPITAL_BASED_OUTPATIENT_CLINIC_OR_DEPARTMENT_OTHER): Payer: Self-pay | Admitting: Pulmonary Disease

## 2024-05-04 DIAGNOSIS — F3181 Bipolar II disorder: Secondary | ICD-10-CM

## 2024-05-07 ENCOUNTER — Telehealth: Payer: Self-pay | Admitting: Internal Medicine

## 2024-05-07 ENCOUNTER — Other Ambulatory Visit (HOSPITAL_BASED_OUTPATIENT_CLINIC_OR_DEPARTMENT_OTHER): Payer: Self-pay

## 2024-05-07 ENCOUNTER — Other Ambulatory Visit: Payer: Self-pay

## 2024-05-07 ENCOUNTER — Encounter: Payer: Self-pay | Admitting: Internal Medicine

## 2024-05-07 DIAGNOSIS — I2510 Atherosclerotic heart disease of native coronary artery without angina pectoris: Secondary | ICD-10-CM

## 2024-05-07 MED ORDER — BUPROPION HCL ER (XL) 150 MG PO TB24: 150.0000 mg | ORAL_TABLET | Freq: Every day | ORAL | 5 refills | Status: AC

## 2024-05-07 MED ORDER — ROSUVASTATIN CALCIUM 10 MG PO TABS: 10.0000 mg | ORAL_TABLET | Freq: Every day | ORAL | 4 refills | Status: AC

## 2024-05-07 MED ORDER — LORAZEPAM 0.5 MG PO TABS: 0.5000 mg | ORAL_TABLET | Freq: Every day | ORAL | 0 refills | Status: AC | PRN

## 2024-05-07 MED ORDER — LAMOTRIGINE 200 MG PO TABS: 200.0000 mg | ORAL_TABLET | Freq: Every evening | ORAL | 5 refills | Status: AC

## 2024-05-07 MED ORDER — ISOSORBIDE MONONITRATE ER 30 MG PO TB24: 30.0000 mg | ORAL_TABLET | Freq: Every day | ORAL | 4 refills | Status: AC

## 2024-05-07 MED ORDER — CLOPIDOGREL BISULFATE 75 MG PO TABS: 75.0000 mg | ORAL_TABLET | Freq: Every day | ORAL | 4 refills | Status: AC

## 2024-05-07 MED ORDER — DULOXETINE HCL 30 MG PO CPEP: 30.0000 mg | ORAL_CAPSULE | Freq: Every day | ORAL | 3 refills | Status: AC

## 2024-05-07 MED ORDER — METOPROLOL SUCCINATE ER 25 MG PO TB24: 25.0000 mg | ORAL_TABLET | Freq: Every day | ORAL | 4 refills | Status: AC

## 2024-05-07 MED ORDER — ALBUTEROL SULFATE HFA 108 (90 BASE) MCG/ACT IN AERS: 1.0000 | INHALATION_SPRAY | Freq: Four times a day (QID) | RESPIRATORY_TRACT | 3 refills | Status: AC | PRN

## 2024-05-07 MED ORDER — METOPROLOL SUCCINATE ER 25 MG PO TB24: 25.0000 mg | ORAL_TABLET | Freq: Every day | ORAL | 4 refills | Status: DC

## 2024-05-07 MED ORDER — CLOPIDOGREL BISULFATE 75 MG PO TABS: 75.0000 mg | ORAL_TABLET | Freq: Every day | ORAL | 4 refills | Status: DC

## 2024-05-07 NOTE — Telephone Encounter (Signed)
 See MyChart 12/1

## 2024-05-07 NOTE — Telephone Encounter (Signed)
 Pt c/o medication issue:  1. Name of Medication: clopidogrel  (PLAVIX ) 75 MG tablet   2. How are you currently taking this medication (dosage and times per day)? As written  3. Are you having a reaction (difficulty breathing--STAT)? no  4. What is your medication issue? Pt would clarification on this med and dose please advise

## 2024-05-07 NOTE — Telephone Encounter (Signed)
 I called the patient and let her know of Dr. Parry response. Per Dr. Wendel, I want her to stop aspirin  and continue on Plavix  only. She understood and stated she will let her nephrologist know. She also stated she needed a refill for her Plavix  and metoprolol  succinate. I sent in the refills and she said she will reach out if needed.

## 2024-05-09 NOTE — Telephone Encounter (Signed)
 Spoke to [facility] she stated that she would cancel all the prescriptions

## 2024-05-12 ENCOUNTER — Emergency Department (HOSPITAL_BASED_OUTPATIENT_CLINIC_OR_DEPARTMENT_OTHER): Admitting: Radiology

## 2024-05-12 ENCOUNTER — Other Ambulatory Visit: Payer: Self-pay

## 2024-05-12 ENCOUNTER — Emergency Department (HOSPITAL_BASED_OUTPATIENT_CLINIC_OR_DEPARTMENT_OTHER)
Admission: EM | Admit: 2024-05-12 | Discharge: 2024-05-12 | Disposition: A | Discharge status: Home / Self Care | Attending: Emergency Medicine | Admitting: Emergency Medicine

## 2024-05-12 ENCOUNTER — Encounter (HOSPITAL_BASED_OUTPATIENT_CLINIC_OR_DEPARTMENT_OTHER): Payer: Self-pay

## 2024-05-12 DIAGNOSIS — W19XXXA Unspecified fall, initial encounter: Secondary | ICD-10-CM

## 2024-05-12 DIAGNOSIS — M79671 Pain in right foot: Secondary | ICD-10-CM | POA: Insufficient documentation

## 2024-05-12 DIAGNOSIS — M25571 Pain in right ankle and joints of right foot: Secondary | ICD-10-CM | POA: Insufficient documentation

## 2024-05-12 DIAGNOSIS — W010XXA Fall on same level from slipping, tripping and stumbling without subsequent striking against object, initial encounter: Secondary | ICD-10-CM | POA: Insufficient documentation

## 2024-05-12 NOTE — ED Triage Notes (Signed)
 Pt to ED reporting falling from stairs. Right foot and ankle pain since fall today.

## 2024-05-12 NOTE — Discharge Instructions (Signed)
 It was a pleasure taking care of you today  Your x-ray did show some old fractures however no new fractures.  You likely have a tendon or ligament injury.  We have placed you in a cam boot.  I recommend Tylenol , ibuprofen, ice and elevate the leg.  If you are still having pain after 3 days make sure to follow-up with orthopedics.  If you do not have 1 there is 1 listed in your discharge paperwork  Return for new or worsening symptoms

## 2024-05-12 NOTE — ED Provider Notes (Signed)
 [place] EMERGENCY DEPARTMENT AT [facility] Provider Note   CSN: [id number] Arrival date & time: 05/12/24  1721     Patient presents with: Jasmine [name] Jasmine [name] is a 53 y.o. female here for evaluation of fall.  Tripped and fell while walking down the stairs.  Denies any head, LOC or anticoagulation.  She has had pain to her right lateral ankle and midfoot.  She states she has broken her foot as well as her ankle multiple times.  Has been able to walk however painful.  No meds PTA.  No numbness, weakness, swelling.   HPI     Prior to Admission medications   Medication Sig Start Date End Date Taking? Authorizing Provider  acetaminophen  (TYLENOL ) 500 MG tablet Take 1,000 mg by mouth every 6 (six) hours as needed (pain.).    [provider]  albuterol  (VENTOLIN  HFA) 108 (90 Base) MCG/ACT inhaler Inhale 1-2 puffs into the lungs every 6 (six) hours as needed for wheezing or shortness of breath. INHALE ONE TO TWO PUFFS into THE lungs EVERY 6 HOURS AS NEEDED FOR WHEEZING OR SHORTNESS OF BREATH 05/07/24   Jude Harden GAILS, MD  Blood Glucose Monitoring Suppl DEVI 1 each by Does not apply route 3 (three) times daily. May dispense any manufacturer covered by patient's insurance. 11/03/23   Maree, Pratik D, DO  buPROPion  (WELLBUTRIN  XL) 150 MG 24 hr tablet Take 1 tablet (150 mg total) by mouth daily. 05/07/24 11/03/24  Vickey Mettle, MD  cephALEXin  (KEFLEX ) 500 MG capsule Take 1 capsule (500 mg total) by mouth daily. 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    [provider]  clopidogrel  (PLAVIX ) 75 MG tablet Take 1 tablet (75 mg total) by mouth daily. 05/07/24   Thukkani, Arun K, MD  Continuous Blood Gluc Sensor MISC 1 each by Does not apply route as directed. Use as directed every 14 days. May dispense FreeStyle [employer] or similar.    [provider]  DULoxetine  (CYMBALTA ) 30 MG  capsule Take 1 capsule (30 mg total) by mouth daily. 05/07/24 09/04/24  Vickey Mettle, MD  EPINEPHrine  0.3 mg/0.3 mL IJ SOAJ injection Inject 0.3 mg into the muscle as needed for anaphylaxis. 12/10/23   [provider]  fluticasone  (FLONASE ) 50 MCG/ACT nasal spray Place 2 sprays into both nostrils daily as needed for rhinitis or allergies (sinus headache).    [provider]  FOLIC ACID  PO Take 1 capsule by mouth daily.    [provider]  glucagon (GLUCAGEN  HYPOKIT) 1 MG SOLR injection Inject 1 mg into the skin once as needed for up to 1 dose for low blood sugar. GlucaGen  HypoKit 1 mg Injection 03/31/23   Johnson, Clanford L, MD  Glucose Blood (BLOOD GLUCOSE TEST STRIPS) STRP 1 each by Does not apply route 3 (three) times daily. Use as directed to check blood sugar. May dispense any manufacturer covered by patient's insurance and fits patient's device. 11/03/23   Maree, Pratik D, DO  hydrALAZINE  (APRESOLINE ) 25 MG tablet Take 3 tablets (75 mg total) by mouth 3 (three) times daily. Patient taking differently: Take 75 mg by mouth in the morning and at bedtime. On Sun., Mon., Wed., Fri. 01/09/24   Thukkani, Arun K, MD  insulin  aspart (NOVOLOG ) 100 UNIT/ML FlexPen If eating and Blood Glucose (BG) 80 or higher inject 0 units for meal coverage and add  correction dose per scale. If not eating, correction dose only. BG <150= 0 unit; BG 150-200= 1 unit; BG 201-250= 2 unit; BG 251-300= 3 unit; BG 301-350= 4 unit; BG 351-400= 5 unit; BG >400= 6 unit and Call Primary Care. (When not using pump) Patient not taking: Reported on 04/11/2024 11/03/23   Maree Bracken D, DO  insulin  degludec (TRESIBA ) 100 UNIT/ML FlexTouch Pen Inject 50 Units into the skin daily. May substitute as needed per insurance. When not using pump. Patient not taking: Reported on 04/11/2024 11/03/23   Maree Bracken D, DO  Insulin  Pen Needle (PEN NEEDLES) 31G X 5 MM MISC 1 each by Does not apply route 3 (three) times daily. May  dispense any manufacturer covered by patient's insurance. 11/03/23   Maree, Pratik D, DO  isosorbide  mononitrate (IMDUR ) 30 MG 24 hr tablet Take 1 tablet (30 mg total) by mouth at bedtime. 05/07/24   Thukkani, Arun K, MD  lamoTRIgine  (LAMICTAL ) 200 MG tablet Take 1 tablet (200 mg total) by mouth every evening. 05/07/24 11/03/24  Vickey Mettle, MD  Lancet Device MISC 1 each by Does not apply route 3 (three) times daily. May dispense any manufacturer covered by patient's insurance. 11/03/23   Maree Bracken D, DO  Lancets MISC 1 each by Does not apply route 3 (three) times daily. Use as directed to check blood sugar. May dispense any manufacturer covered by patient's insurance and fits patient's device. 11/03/23   Maree, Pratik D, DO  levothyroxine  (SYNTHROID ) 175 MCG tablet Take 175 mcg by mouth daily before breakfast. 11/20/21   [provider]  lidocaine -prilocaine  (EMLA ) cream Apply topically 3 (three) times a week. 02/20/24   [provider]  LORazepam  (ATIVAN ) 0.5 MG tablet Take 1 tablet (0.5 mg total) by mouth daily as needed for anxiety. 05/01/24 06/30/24  Vickey Mettle, MD  LORazepam  (ATIVAN ) 0.5 MG tablet Take 1 tablet (0.5 mg total) by mouth daily as needed for anxiety. 05/07/24 06/06/24  Vickey Mettle, MD  Melatonin 10 MG TABS Take 10 mg by mouth at bedtime.    [provider]  metoprolol  succinate (TOPROL  XL) 25 MG 24 hr tablet Take 1 tablet (25 mg total) by mouth at bedtime. 05/07/24   Thukkani, Arun K, MD  NOVOLOG  100 UNIT/ML injection Inject 120 Units into the skin 2 days. Via insulin  pump    [provider]  ondansetron  (ZOFRAN -ODT) 4 MG disintegrating tablet Take 4 mg by mouth every 8 (eight) hours as needed for nausea or vomiting. 03/17/22   [provider]  oxyCODONE -acetaminophen  (PERCOCET/ROXICET) 5-325 MG tablet Take 1 tablet by mouth every 8 (eight) hours as needed for moderate pain (pain score 4-6). For 15 days 01/30/24   [provider]   OXYGEN Inhale 3 L into the lungs at bedtime.    [provider]  rizatriptan  (MAXALT ) 10 MG tablet Take 10 mg by mouth as needed for migraine. May repeat in 2 hours if needed    [provider]  rosuvastatin  (CRESTOR ) 10 MG tablet Take 1 tablet (10 mg total) by mouth daily. 05/07/24   Thukkani, Arun K, MD  sevelamer  carbonate (RENVELA ) 800 MG tablet Take 3,200 mg by mouth 3 (three) times daily with meals. 07/28/23   [provider]  torsemide  (DEMADEX ) 20 MG tablet Take 40 mg by mouth See admin instructions. Take 40 mg daily on non-dialysis Sun, Mon, Wed, and Fri    [provider]    Allergies: Ciprofloxacin , Levofloxacin, Linaclotide, Promethazine , Buspar [buspirone], Advair  diskus [fluticasone -salmeterol], Clarithromycin, Hydroxyzine, and Ondansetron  hcl    Review of Systems  Constitutional: Negative.   HENT: Negative.    Respiratory: Negative.    Cardiovascular: Negative.   Gastrointestinal: Negative.   Genitourinary: Negative.   Musculoskeletal:        Right foot and ankle pain  Skin: Negative.   Neurological: Negative.   All other systems reviewed and are negative.   Updated Vital Signs BP 115/64 (BP Location: Right Arm)   Pulse 71   Temp 98 F (36.7 C) (Oral)   Resp 14   LMP 03/23/2017 (Approximate)   SpO2 98%   Physical Exam Vitals and nursing note reviewed.  Constitutional:      General: She is not in acute distress.    Appearance: She is well-developed. She is not ill-appearing.  HENT:     Head: Atraumatic.  Eyes:     Pupils: Pupils are equal, round, and reactive to light.  Cardiovascular:     Rate and Rhythm: Normal rate.     Pulses:          Dorsalis pedis pulses are 2+ on the right side.       Posterior tibial pulses are 2+ on the right side.  Pulmonary:     Effort: No respiratory distress.  Abdominal:     General: There is no distension.  Musculoskeletal:        General: Normal range of motion.     Cervical back:  Normal range of motion.     Comments: Diffuse tenderness lateral malleolus and superior aspect right foot.  Able to plantarflex, dorsiflex.  No overlying soft tissue swelling.  Compartments soft.  Nontender midshaft, proximal tib-fib.  Skin:    General: Skin is warm and dry.     Capillary Refill: Capillary refill takes less than 2 seconds.     Comments: No edema, erythema, warmth, fluctuance, induration.  No rashes or lesions.  Neurological:     General: No focal deficit present.     Mental Status: She is alert.     Sensory: Sensation is intact.     Motor: Motor function is intact.     Comments: Ambulatory with limp  Psychiatric:        Mood and Affect: Mood normal.     (all labs ordered are listed, but only abnormal results are displayed) Labs Reviewed - No data to display  EKG: None  Radiology: DG Ankle Complete Right Result Date: 05/12/2024 EXAM: 3 OR MORE VIEW(S) XRAY OF THE RIGHT ANKLE 05/12/2024 05:47:16 PM CLINICAL HISTORY: Fall with pain. COMPARISON: None available. FINDINGS: BONES AND JOINTS: No definite acute fracture. No malalignment. Deformity of the tarsal navicular bone is noted, suggesting a probable old fracture or avascular necrosis (AVN). A CT scan may be performed for evaluation of the navicular bone. SOFT TISSUES: Vascular calcifications are noted. IMPRESSION: 1. Deformity of the tarsal navicular bone, possibly representing an old fracture or avascular necrosis. 2. Vascular calcifications. 3. CT may be helpful for further evaluation of the navicular bone. Electronically signed by: Lynwood Seip MD 05/12/2024 06:14 PM EST RP Workstation: [id number]   DG Foot Complete Right Result Date: 05/12/2024 EXAM: 3 VIEW(S) XRAY OF THE RIGHT FOOT 05/12/2024 05:47:16 PM COMPARISON: 04/06/2018 CLINICAL HISTORY: fall with pain FINDINGS: BONES AND JOINTS: No acute fracture. 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  SOFT TISSUES: Stable linear calcification seen in plantar soft tissues. IMPRESSION: 1. No acute  fracture or dislocation. 2. 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 Electronically signed by: Lynwood Seip MD 05/12/2024 06:10 PM EST RP Workstation: [id number]     .Ortho Injury Treatment  Date/Time: 05/12/2024 6:33 PM  Performed by: Edie Rosebud LABOR, PA-C Authorized by: Edie Rosebud LABOR, PA-C   Consent:    Consent obtained:  Verbal   Consent given by:  Patient   Risks discussed:  Nerve damage, stiffness, vascular damage, restricted joint movement, recurrent dislocation, fracture and irreducible dislocation   Alternatives discussed:  No treatment, alternative treatment, immobilization, referral and delayed treatmentInjury location: foot Location details: right foot Injury type: soft tissue Pre-procedure neurovascular assessment: neurovascularly intact Pre-procedure distal perfusion: normal Pre-procedure neurological function: normal Pre-procedure range of motion: normal  Anesthesia: Local anesthesia used: no  Patient sedated: NoImmobilization: brace Splint Applied by: ED Nurse Post-procedure neurovascular assessment: post-procedure neurovascularly intact Post-procedure distal perfusion: normal Post-procedure neurological function: normal Post-procedure range of motion: normal      Medications Ordered in the ED - No data to display  53 year old here for evaluation mechanical fall.  Tripped and fell walking down the steps.  Pain to right ankle and foot.  Has had multiple fractures previously.  Here she is neurovascular intact.  Compartments are soft.  Full range of motion.  Does have some pain with walking.  Will plan on imaging does not want anything for pain.  Imaging personally viewed interpreted X-ray without acute fracture or dislocation does show deformity of the navicular bone consistent with old trauma or  avascular necrosis--- patient admits to multiple traumas to the area.  Patient reassessed.  Discussed imaging.  Will place in cam boot.  Will have her follow-up outpatient, return for any worsening symptoms, RICE for symptomatic management  The patient has been appropriately medically screened and/or stabilized in the ED. I have low suspicion for any other emergent medical condition which would require further screening, evaluation or treatment in the ED or require inpatient management.  Patient is hemodynamically stable and in no acute distress.  Patient able to ambulate in department prior to ED.  Evaluation does not show acute pathology that would require ongoing or additional emergent interventions while in the emergency department or further inpatient treatment.  I have discussed the diagnosis with the patient and answered all questions.  Pain is been managed while in the emergency department and patient has no further complaints prior to discharge.  Patient is comfortable with plan discussed in room and is stable for discharge at this time.  I have discussed strict return precautions for returning to the emergency department.  Patient was encouraged to follow-up with PCP/specialist refer to at discharge.                                   Medical Decision Making Amount and/or Complexity of Data Reviewed External Data Reviewed: labs, radiology and notes. Radiology: ordered and independent interpretation performed. Decision-making details documented in ED Course.  Risk OTC drugs. Decision regarding hospitalization. Diagnosis or treatment significantly limited by social determinants of health.        Final diagnoses:  Fall, initial encounter  Acute right ankle pain  Right foot pain    ED Discharge Orders     None          Brando Taves A, PA-C 05/12/24 1835    Ruthe Cornet, DO 05/12/24 [id number]
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
## 2024-05-28 ENCOUNTER — Ambulatory Visit: Admitting: Psychiatry

## 2024-06-04 ENCOUNTER — Encounter: Payer: Self-pay | Admitting: Psychiatry

## 2024-06-04 ENCOUNTER — Telehealth: Admitting: Psychiatry

## 2024-06-04 DIAGNOSIS — F411 Generalized anxiety disorder: Secondary | ICD-10-CM

## 2024-06-04 DIAGNOSIS — F603 Borderline personality disorder: Secondary | ICD-10-CM | POA: Diagnosis not present

## 2024-06-04 DIAGNOSIS — F3181 Bipolar II disorder: Secondary | ICD-10-CM | POA: Diagnosis not present

## 2024-06-12 ENCOUNTER — Encounter (HOSPITAL_BASED_OUTPATIENT_CLINIC_OR_DEPARTMENT_OTHER): Payer: Self-pay | Admitting: Pulmonary Disease

## 2024-06-13 ENCOUNTER — Other Ambulatory Visit (HOSPITAL_BASED_OUTPATIENT_CLINIC_OR_DEPARTMENT_OTHER): Payer: Self-pay

## 2024-06-13 DIAGNOSIS — J9611 Chronic respiratory failure with hypoxia: Secondary | ICD-10-CM

## 2024-06-13 DIAGNOSIS — G4733 Obstructive sleep apnea (adult) (pediatric): Secondary | ICD-10-CM

## 2024-06-20 ENCOUNTER — Encounter (HOSPITAL_COMMUNITY): Payer: Self-pay | Admitting: Vascular Surgery

## 2024-06-20 ENCOUNTER — Ambulatory Visit (HOSPITAL_COMMUNITY)
Admission: RE | Admit: 2024-06-20 | Discharge: 2024-06-20 | Disposition: A | Discharge status: Home / Self Care | Attending: Vascular Surgery | Admitting: Vascular Surgery

## 2024-06-20 ENCOUNTER — Encounter (HOSPITAL_COMMUNITY): Admission: RE | Disposition: A | Payer: Self-pay | Source: Home / Self Care | Attending: Vascular Surgery

## 2024-06-20 ENCOUNTER — Other Ambulatory Visit: Payer: Self-pay

## 2024-06-20 DIAGNOSIS — Z87891 Personal history of nicotine dependence: Secondary | ICD-10-CM | POA: Diagnosis not present

## 2024-06-20 DIAGNOSIS — N186 End stage renal disease: Secondary | ICD-10-CM | POA: Diagnosis not present

## 2024-06-20 DIAGNOSIS — Z992 Dependence on renal dialysis: Secondary | ICD-10-CM | POA: Diagnosis not present

## 2024-06-20 DIAGNOSIS — Y832 Surgical operation with anastomosis, bypass or graft as the cause of abnormal reaction of the patient, or of later complication, without mention of misadventure at the time of the procedure: Secondary | ICD-10-CM | POA: Diagnosis not present

## 2024-06-20 DIAGNOSIS — T82868A Thrombosis of vascular prosthetic devices, implants and grafts, initial encounter: Secondary | ICD-10-CM | POA: Insufficient documentation

## 2024-06-20 DIAGNOSIS — I509 Heart failure, unspecified: Secondary | ICD-10-CM | POA: Diagnosis not present

## 2024-06-20 DIAGNOSIS — I82C11 Acute embolism and thrombosis of right internal jugular vein: Secondary | ICD-10-CM | POA: Diagnosis not present

## 2024-06-20 DIAGNOSIS — I132 Hypertensive heart and chronic kidney disease with heart failure and with stage 5 chronic kidney disease, or end stage renal disease: Secondary | ICD-10-CM | POA: Insufficient documentation

## 2024-06-20 DIAGNOSIS — E1022 Type 1 diabetes mellitus with diabetic chronic kidney disease: Secondary | ICD-10-CM | POA: Insufficient documentation

## 2024-06-20 HISTORY — PX: DIALYSIS/PERMA CATHETER INSERTION: CATH118288

## 2024-06-20 MED ORDER — HEPARIN SODIUM (PORCINE) 1000 UNIT/ML IJ SOLN
INTRAMUSCULAR | Status: AC
  Filled 2024-06-20: qty 10

## 2024-06-20 MED ORDER — HEPARIN SODIUM (PORCINE) 1000 UNIT/ML IJ SOLN
INTRAMUSCULAR | Status: DC | PRN
  Administered 2024-06-20 (×2): 3100 [IU] via INTRAVENOUS

## 2024-06-20 MED ORDER — HEPARIN (PORCINE) IN NACL 1000-0.9 UT/500ML-% IV SOLN
INTRAVENOUS | Status: DC | PRN
  Administered 2024-06-20: 500 mL

## 2024-06-20 MED ORDER — LIDOCAINE HCL (PF) 1 % IJ SOLN
INTRAMUSCULAR | Status: DC | PRN
  Administered 2024-06-20: 10 mL via SUBCUTANEOUS
  Administered 2024-06-20 (×8): 5 mL via SUBCUTANEOUS

## 2024-06-20 MED ORDER — LIDOCAINE HCL (PF) 1 % IJ SOLN
INTRAMUSCULAR | Status: AC
  Filled 2024-06-20: qty 30

## 2024-06-20 NOTE — H&P (Signed)
 " HD ACCESS CENTER H&P   Patient ID: Jasmine [name], female   DOB: 1971/06/03, 54 y.o.   MRN: [id number]  Subjective:     HPI Jasmine [name] is a 54 y.o. female with ESRD presenting for evaluation of HD access and consideration of intervention. Referred by: Dr. Marcelino Having issues with thrombosed left arm access  Past Medical History:  Diagnosis Date   Anemia    Anxiety    Arthritis    Asthma    Balance problems    Bipolar disorder (HCC)    Charcot ankle    CHF (congestive heart failure) (HCC)    Chronic fatigue    Coronary artery disease    Depression    DKA, type 1 (HCC) 11/04/2011   Elevated cholesterol    ESRD on hemodialysis (HCC)    TTS at [facility]   Fibromyalgia    GERD (gastroesophageal reflux disease)    Headache    History of suicidal ideation    Hyperlipemia    Hypertension    Hypothyroidism    IBS (irritable bowel syndrome)    Memory changes    Obesity    Pneumonia    x several   Sleep apnea    uses oxygen 3L at night with cpap nightly   Stress incontinence    Pt had surgery to correct this.   Tobacco abuse    Tremor    Family History  Problem Relation Age of Onset   Asthma Mother    Bipolar disorder Mother    Heart disease Father    Lymphoma Father    Hypertension Father    Thyroid  disease Father    Hyperlipidemia Father    Diabetes Father    Cancer Paternal Grandmother        lung and breast   Bladder Cancer Paternal Grandfather    Suicidality Maternal Grandfather    Thyroid  disease Brother    Past Surgical History:  Procedure Laterality Date   A/V FISTULAGRAM Left 03/05/2024   Procedure: A/V Fistulagram;  Surgeon: Magda Debby LOISE, MD;  Location: [facility];  Service: Cardiovascular;  Laterality: Left;   A/V SHUNT INTERVENTION N/A 03/05/2024   Procedure: A/V SHUNT INTERVENTION;  Surgeon: Magda Debby LOISE, MD;  Location: [facility];  Service: Cardiovascular;  Laterality: N/A;   AV FISTULA PLACEMENT Left 10/14/2023    Procedure: BRACHIOBASILIC ARTERIOVENOUS (AV) FISTULA CREATION LEFT;  Surgeon: Serene Gaile ORN, MD;  Location: [facility];  Service: Vascular;  Laterality: Left;   BASCILIC VEIN TRANSPOSITION Left 12/16/2023   Procedure: TRANSPOSITION, VEIN, BASILIC;  Surgeon: Serene Gaile ORN, MD;  Location: [facility];  Service: Vascular;  Laterality: Left;   CORONARY IMAGING/OCT N/A 08/30/2023   Procedure: CORONARY IMAGING/OCT;  Surgeon: Wendel Lurena POUR, MD;  Location: [facility];  Service: Cardiovascular;  Laterality: N/A;   CORONARY STENT INTERVENTION N/A 08/30/2023   Procedure: CORONARY STENT INTERVENTION;  Surgeon: Wendel Lurena POUR, MD;  Location: [facility];  Service: Cardiovascular;  Laterality: N/A;   DIALYSIS/PERMA CATHETER INSERTION N/A 11/30/2023   Procedure: DIALYSIS/PERMA CATHETER INSERTION;  Surgeon: Pearline Norman RAMAN, MD;  Location: [facility];  Service: Cardiovascular;  Laterality: N/A;   INCONTINENCE SURGERY     IR FLUORO GUIDE CV LINE LEFT  09/10/2023   IR PATIENT EVAL TECH 0-60 MINS  04/04/2024   IR US  GUIDE VASC ACCESS LEFT  09/10/2023   LEFT HEART CATH AND CORONARY ANGIOGRAPHY N/A 07/15/2023   Procedure: LEFT HEART  CATH AND CORONARY ANGIOGRAPHY;  Surgeon: Wendel Lurena POUR, MD;  Location: [facility] INVASIVE CV LAB;  Service: Cardiovascular;  Laterality: N/A;   LEFT HEART CATH AND CORONARY ANGIOGRAPHY N/A 08/30/2023   Procedure: LEFT HEART CATH AND CORONARY ANGIOGRAPHY;  Surgeon: Wendel Lurena POUR, MD;  Location: [facility];  Service: Cardiovascular;  Laterality: N/A;   NASAL FRACTURE SURGERY     ovary removed     OVARY SURGERY     PUBOVAGINAL SLING  08/16/2011   Procedure: CARLOYN GLADE;  Surgeon: Alm GORMAN Fragmin, MD;  Location: [facility];  Service: Urology;  Laterality: N/A;          TEE WITHOUT CARDIOVERSION N/A 09/08/2023   Procedure: ECHOCARDIOGRAM, TRANSESOPHAGEAL;  Surgeon: Alvan Dorn FALCON, MD;  Location: [facility];  Service: Endoscopy;  Laterality: N/A;   TUNNELLED  CATHETER EXCHANGE N/A 09/30/2023   Procedure: TUNNELLED CATHETER EXCHANGE;  Surgeon: Melia Lynwood ORN, MD;  Location: [facility] INV[facility] CV [facility];  Service: Cardiovascular;  Laterality: N/A;   TUNNELLED CATHETER EXCHANGE N/A 10/07/2023   Procedure: TUNNELLED CATHETER EXCHANGE;  Surgeon: Tobie Gordy POUR, MD;  Location: [facility] INVASIVE CV LAB;  Service: Cardiovascular;  Laterality: N/A;   UTERINE FIBROID SURGERY  2001   VENOUS ANGIOPLASTY Left 03/05/2024   Procedure: VENOUS ANGIOPLASTY;  Surgeon: Magda Debby SAILOR, MD;  Location: [facility];  Service: Cardiovascular;  Laterality: Left;  Basilic Vein    Short Social History:  Social History   Tobacco Use   Smoking status: Former    Current packs/day: 0.00    Average packs/day: 0.8 packs/day for 20.0 years (15.0 ttl pk-yrs)    Types: Cigarettes    Start date: 06/08/1991    Quit date: 06/08/2011    Years since quitting: 13.0    Passive exposure: Past   Smokeless tobacco: Never  Substance Use Topics   Alcohol use: No    Allergies[1]  No current facility-administered medications for this encounter.    REVIEW OF SYSTEMS All other systems were reviewed and are negative     Objective:   Objective   Vitals:   06/20/24 0721 06/20/24 0741  BP: (!) 143/65   Pulse: 73   Resp: 12 14  Temp: 98.1 F (36.7 C)   TempSrc: Oral   SpO2: 95%    There is no height or weight on file to calculate BMI.  Physical Exam General: no acute distress Cardiac: hemodynamically stable Extremities: No pulse or thrill in left arm aVF  Data: Reviewed fistulogram from Dr. Magda performed September 2025     Assessment/Plan:   Jasmine [name] is a 54 y.o. female with ESRD and a poorly functioning left arm access. Having issues with thrombosed left arm access. Last HD session Saturday, access did not work on Tuesday.  This is a brachiobasilic fistula and I explained that a fistula declot is typically unsuccessful.  We discussed that we would start by obtaining access  and doing a fistulogram.  If the entire fistula is thrombosed and explained would have to place a tunneled dialysis catheter. Risks and benefits were reviewed and the patient elected to proceed.   Norman Serve, MD [facility]      [1]  Allergies Allergen Reactions   Ciprofloxacin  Nausea Only, Swelling, Dermatitis and Other (See Comments)    Per pt caused lips swell and nauseous feeling   Levofloxacin Other (See Comments), Swelling and Nausea Only    Per pt caused lips swell and nauseous feeling  Linaclotide Nausea Only and Other (See Comments)    Cause severe dehydration   Promethazine  Other (See Comments)    Completely wipes out/fatigue   Buspar [Buspirone] Other (See Comments)    Abd cramping   Advair Diskus [Fluticasone -Salmeterol] Other (See Comments)    Thrush    Clarithromycin Dermatitis and Rash   Hydroxyzine Palpitations   Ondansetron  Hcl Other (See Comments)    Makes pt more nauseous and drunk   "

## 2024-06-20 NOTE — Op Note (Signed)
 "   Patient name: Jasmine [name] MRN: [id number] DOB: [birth year] Sex: female  06/20/2024 Pre-operative Diagnosis: ESRD on HD Post-operative diagnosis:  Same Surgeon:  Norman GORMAN Serve, MD Procedure Performed:  Outpatient evaluation, level 3 Ultrasound and fluoroscopic guided right femoral tunneled dialysis catheter placement  Indications: Jasmine [name] is a 54 year old female with ESRD on HD who presented to the HD access center with a thrombosed left brachiobasilic fistula.  Her last HD session was Saturday and he was unable to be accessed on Tuesday.  She presents today for consideration of thrombectomy.  I explained that a thrombectomy of the fistula is rarely successful although that we will attempt if possible.  We also discussed risks and benefits of placement of a tunneled dialysis catheter.  She elected to proceed.  Findings:  Complete thrombosis of left arm AVF.  Was able to access with a micropuncture needle but unable to thread the microwire.  Unsuccessful left IJ cannulation. Successful placement of tunneled dialysis catheter in right common femoral vein.   Procedure:  The patient was identified in the holding area and taken to the cath lab  The patient was then placed supine on the table and prepped and draped in the usual sterile fashion.  A time out was called.  Ultrasound was used to evaluate the left arm AV access, it appeared to have intraluminal thrombus throughout. This was accessed under u/s guidance although I was unable to track the 018 microwire.  We then turned attention to placing a tunneled dialysis catheter. The right IJ appeared thrombosed.  The left IJ appeared patent and compressible.  I attempted multiple times access left IJ although was unable to get a wire to thread.  We then prepped and draped bilateral groins in the usual sterile fashion.  The left common femoral vein was accessed under ultrasound guidance and a microsheath was placed over the microwire.  The  J-wire was placed through the sheath although she was having significant pain in this region and prior to dilating up I elected to remove the wire and hold manual pressure for hemostasis and switch access to the right common femoral vein. Using ultrasound guidance the right common femoral vein was accessed with micropuncture technique.  Through the micropuncture sheath, the guidewire was advanced into the inferior vena cava.  A small incision was made around the skin access point.  The access point was serially dilated under direct fluoroscopic guidance.  A peel-away sheath was introduced into the right iliac vein fluoroscopic guidance.  A counterincision was made in the lateral thigh.  A 55 cm tunnel dialysis catheter was then tunneled under the skin to the femoral vein access site.  The tunneling device was removed and the catheter fed through the peel-away sheath into the IVC/right atrium.  The peel-away sheath was removed and the catheter gently pulled back.  Adequate position was confirmed with x-ray.  The catheter was tested and found to aspirate and flush with ease.    The catheter was sutured to the skin and the neck incision was closed with 4-0 Monocryl.  The catheter was then capped and heparin  locked.  I then evaluated the left groin with ultrasound.  There did not appear to be any evidence of pseudoaneurysm, hematoma or arterial injury.   Contrast: None Sedation: None  Impression: Complete thrombosis of left brachiobasilic fistula.  Will need new upper extremity access. Successful placement of right common femoral vein tunneled dialysis catheter   Norman GORMAN Serve MD [facility] [place] Office: [phone]  "

## 2024-06-25 ENCOUNTER — Ambulatory Visit (HOSPITAL_BASED_OUTPATIENT_CLINIC_OR_DEPARTMENT_OTHER): Admitting: Pulmonary Disease

## 2024-06-29 ENCOUNTER — Other Ambulatory Visit: Payer: Self-pay | Admitting: Psychiatry

## 2024-07-11 ENCOUNTER — Other Ambulatory Visit: Payer: Self-pay

## 2024-07-11 DIAGNOSIS — Z992 Dependence on renal dialysis: Secondary | ICD-10-CM

## 2024-07-25 ENCOUNTER — Telehealth: Admitting: Psychiatry

## 2024-08-06 ENCOUNTER — Ambulatory Visit: Admitting: Surgery

## 2024-08-06 ENCOUNTER — Ambulatory Visit (HOSPITAL_COMMUNITY)

## 2024-10-26 ENCOUNTER — Ambulatory Visit (HOSPITAL_BASED_OUTPATIENT_CLINIC_OR_DEPARTMENT_OTHER): Admitting: Pulmonary Disease
